# Patient Record
Sex: Male | Born: 1965 | Race: Black or African American | Hispanic: No | Smoking: Never smoker
Health system: Southern US, Community
[De-identification: ages and names within clinical notes are randomized; demographics above are authoritative.]

## PROBLEM LIST (undated history)

## (undated) DIAGNOSIS — L089 Local infection of the skin and subcutaneous tissue, unspecified: Secondary | ICD-10-CM

## (undated) DIAGNOSIS — E785 Hyperlipidemia, unspecified: Secondary | ICD-10-CM

## (undated) DIAGNOSIS — D649 Anemia, unspecified: Secondary | ICD-10-CM

## (undated) DIAGNOSIS — I2510 Atherosclerotic heart disease of native coronary artery without angina pectoris: Secondary | ICD-10-CM

## (undated) DIAGNOSIS — Z87442 Personal history of urinary calculi: Secondary | ICD-10-CM

## (undated) DIAGNOSIS — I219 Acute myocardial infarction, unspecified: Secondary | ICD-10-CM

## (undated) DIAGNOSIS — E11628 Type 2 diabetes mellitus with other skin complications: Secondary | ICD-10-CM

## (undated) DIAGNOSIS — R0600 Dyspnea, unspecified: Secondary | ICD-10-CM

## (undated) DIAGNOSIS — I5022 Chronic systolic (congestive) heart failure: Secondary | ICD-10-CM

## (undated) DIAGNOSIS — I509 Heart failure, unspecified: Secondary | ICD-10-CM

## (undated) DIAGNOSIS — I10 Essential (primary) hypertension: Secondary | ICD-10-CM

## (undated) DIAGNOSIS — E119 Type 2 diabetes mellitus without complications: Secondary | ICD-10-CM

## (undated) DIAGNOSIS — Z95810 Presence of automatic (implantable) cardiac defibrillator: Secondary | ICD-10-CM

## (undated) DIAGNOSIS — N289 Disorder of kidney and ureter, unspecified: Secondary | ICD-10-CM

## (undated) DIAGNOSIS — I428 Other cardiomyopathies: Secondary | ICD-10-CM

## (undated) HISTORY — PX: EYE SURGERY: SHX253

## (undated) HISTORY — PX: INSERT / REPLACE / REMOVE PACEMAKER: SUR710

## (undated) HISTORY — PX: BACK SURGERY: SHX140

## (undated) HISTORY — DX: Atherosclerotic heart disease of native coronary artery without angina pectoris: I25.10

## (undated) HISTORY — DX: Chronic systolic (congestive) heart failure: I50.22

## (undated) HISTORY — DX: Hyperlipidemia, unspecified: E78.5

## (undated) HISTORY — DX: Other cardiomyopathies: I42.8

## (undated) HISTORY — PX: CARDIAC CATHETERIZATION: SHX172

## (undated) HISTORY — PX: AMPUTATION TOE: SHX6595

---

## 2001-04-24 ENCOUNTER — Encounter: Payer: Self-pay | Admitting: Emergency Medicine

## 2001-04-24 ENCOUNTER — Emergency Department (HOSPITAL_COMMUNITY): Admission: EM | Admit: 2001-04-24 | Discharge: 2001-04-25 | Payer: Self-pay | Admitting: Emergency Medicine

## 2001-04-25 ENCOUNTER — Encounter: Payer: Self-pay | Admitting: Emergency Medicine

## 2001-04-27 ENCOUNTER — Ambulatory Visit (HOSPITAL_COMMUNITY): Admission: RE | Admit: 2001-04-27 | Discharge: 2001-04-27 | Payer: Self-pay | Admitting: Urology

## 2004-07-25 ENCOUNTER — Emergency Department (HOSPITAL_COMMUNITY): Admission: EM | Admit: 2004-07-25 | Discharge: 2004-07-26 | Payer: Self-pay | Admitting: Emergency Medicine

## 2004-09-09 ENCOUNTER — Ambulatory Visit: Payer: Self-pay | Admitting: Family Medicine

## 2004-12-10 ENCOUNTER — Ambulatory Visit: Payer: Self-pay | Admitting: *Deleted

## 2005-02-10 ENCOUNTER — Ambulatory Visit: Payer: Self-pay | Admitting: Internal Medicine

## 2005-02-24 ENCOUNTER — Ambulatory Visit: Payer: Self-pay | Admitting: Family Medicine

## 2005-08-29 ENCOUNTER — Emergency Department (HOSPITAL_COMMUNITY): Admission: EM | Admit: 2005-08-29 | Discharge: 2005-08-29 | Payer: Self-pay | Admitting: Emergency Medicine

## 2005-09-02 ENCOUNTER — Emergency Department (HOSPITAL_COMMUNITY): Admission: EM | Admit: 2005-09-02 | Discharge: 2005-09-02 | Payer: Self-pay | Admitting: Emergency Medicine

## 2005-12-29 ENCOUNTER — Ambulatory Visit (HOSPITAL_COMMUNITY): Admission: RE | Admit: 2005-12-29 | Discharge: 2005-12-29 | Payer: Self-pay | Admitting: Internal Medicine

## 2005-12-29 ENCOUNTER — Ambulatory Visit: Payer: Self-pay | Admitting: Internal Medicine

## 2006-01-12 ENCOUNTER — Ambulatory Visit: Payer: Self-pay | Admitting: Internal Medicine

## 2006-02-14 ENCOUNTER — Encounter: Admission: RE | Admit: 2006-02-14 | Discharge: 2006-05-15 | Payer: Self-pay | Admitting: Internal Medicine

## 2006-02-16 ENCOUNTER — Ambulatory Visit: Payer: Self-pay | Admitting: Internal Medicine

## 2006-02-21 ENCOUNTER — Emergency Department (HOSPITAL_COMMUNITY): Admission: EM | Admit: 2006-02-21 | Discharge: 2006-02-21 | Payer: Self-pay | Admitting: Emergency Medicine

## 2006-03-30 ENCOUNTER — Ambulatory Visit: Payer: Self-pay | Admitting: Endocrinology

## 2006-09-12 ENCOUNTER — Ambulatory Visit: Payer: Self-pay | Admitting: Internal Medicine

## 2006-12-28 ENCOUNTER — Emergency Department (HOSPITAL_COMMUNITY): Admission: EM | Admit: 2006-12-28 | Discharge: 2006-12-28 | Payer: Self-pay | Admitting: Emergency Medicine

## 2007-09-14 ENCOUNTER — Emergency Department (HOSPITAL_COMMUNITY): Admission: EM | Admit: 2007-09-14 | Discharge: 2007-09-14 | Payer: Self-pay | Admitting: Emergency Medicine

## 2007-09-15 ENCOUNTER — Encounter: Payer: Self-pay | Admitting: *Deleted

## 2008-01-23 ENCOUNTER — Emergency Department (HOSPITAL_COMMUNITY): Admission: EM | Admit: 2008-01-23 | Discharge: 2008-01-23 | Payer: Self-pay | Admitting: Emergency Medicine

## 2009-08-25 ENCOUNTER — Observation Stay (HOSPITAL_COMMUNITY): Admission: EM | Admit: 2009-08-25 | Discharge: 2009-08-25 | Payer: Self-pay | Admitting: Emergency Medicine

## 2011-03-07 ENCOUNTER — Encounter (INDEPENDENT_AMBULATORY_CARE_PROVIDER_SITE_OTHER): Payer: Self-pay | Admitting: Family Medicine

## 2011-03-07 LAB — CONVERTED CEMR LAB: Microalb, Ur: 21.25 mg/dL — ABNORMAL HIGH (ref 0.00–1.89)

## 2011-03-18 LAB — POCT I-STAT, CHEM 8
Calcium, Ion: 1.2 mmol/L (ref 1.12–1.32)
Hemoglobin: 15.6 g/dL (ref 13.0–17.0)
Sodium: 138 mEq/L (ref 135–145)
TCO2: 26 mmol/L (ref 0–100)

## 2011-03-18 LAB — GLUCOSE, CAPILLARY

## 2011-04-29 NOTE — Op Note (Signed)
[facility]  Patient:    SAJAD, PENTZ                    MRN: [id number] Proc. Date: 04/27/01 Adm. Date:  [id number] Attending:  Wendy Poet Iii                           Operative Report  PREOPERATIVE DIAGNOSIS:  Left ureterolithiasis with hydronephrosis.  OPERATIVE PROCEDURE:  Cystourethroscopy, left ureteroscopy with holmium laser lithotripsy and placement of left double-J stent.  SURGEON:  Duane Lope. Parks Neptune, M.D.  ANESTHESIA:  General laryngeal airway.  ESTIMATED BLOOD LOSS:  Negligible.  TUBES:  26 cm 6 French double pigtail stent.  COMPLICATIONS:  None.  INDICATIONS FOR PROCEDURE:  Mr. Alford Highland is a very nice 45 year old black male, who presented to the emergency room with nausea and vomiting and left flank pain.  He underwent CT scan which revealed a 6 mm lower ureteral calculus with proximal hydronephrosis.  He has persistently had some intermittent pain.  After understanding risks, benefits, and alternatives, elected to proceed with the above procedure.  DESCRIPTION OF PROCEDURE:  The patient was placed in a supine position.  After proper general laryngeal airway anesthesia, was placed in the dorsal lithotomy position and prepped and draped with Betadine in a sterile fashion. Cystourethroscopy was performed with a 22.5 [place] Olympus panendoscope.  He had no significant prostatic hypertrophy, and the bladder was smooth-walled. Efflux of urine was noted from the right ureteral orifice, and the left ureteral orifice was in normal location.  Initial attempts to place a guidewire were unsuccessful; therefore, a .038 [place] Glidewire was passed into the left renal pelvis, and the lower ureter was balloon dilated to 12 atmospheres of pressure with a 4 cm, 5 mm ureteral balloon dilator for three minutes and was noted to have no waisting.  This was removed, and ureteroscopy was then performed with a short, thin ACMI ureteroscope.   The stone was visualized and noted to be quite large.  Utilizing the holmium laser with 365 micron fiber, the stone was fragmented into multiple small fragments, and the fragments were grasped and retrieved with a nitinol basket.  There were a few scattered fragments remaining but no other significant fragments were noted, and ureteroscopy of the lower two-thirds ureter was performed, and there were no other lesions, stones, perforations, etc. noted.  The ureteroscope was visually removed, and a 26 cm, 6 [place] Surgitek double pigtail stent was placed over the guidewire and located in the left renal pelvis and in the bladder.  A pullout string was attached.  The bladder was drained.  The panendoscope was removed.  Stones will be submitted for stone analysis.  The patient was taken to the recovery room in stable condition. DD:  04/27/01 TD:  04/28/01 Job: [id number]

## 2011-05-19 ENCOUNTER — Emergency Department (HOSPITAL_COMMUNITY)
Admission: EM | Admit: 2011-05-19 | Discharge: 2011-05-19 | Disposition: A | Discharge status: Home / Self Care | Payer: Self-pay | Attending: Emergency Medicine | Admitting: Emergency Medicine

## 2011-05-19 ENCOUNTER — Emergency Department (HOSPITAL_COMMUNITY): Payer: Self-pay

## 2011-05-19 DIAGNOSIS — R5381 Other malaise: Secondary | ICD-10-CM | POA: Insufficient documentation

## 2011-05-19 DIAGNOSIS — R059 Cough, unspecified: Secondary | ICD-10-CM | POA: Insufficient documentation

## 2011-05-19 DIAGNOSIS — R42 Dizziness and giddiness: Secondary | ICD-10-CM | POA: Insufficient documentation

## 2011-05-19 DIAGNOSIS — E119 Type 2 diabetes mellitus without complications: Secondary | ICD-10-CM | POA: Insufficient documentation

## 2011-05-19 DIAGNOSIS — R05 Cough: Secondary | ICD-10-CM | POA: Insufficient documentation

## 2011-05-19 LAB — BASIC METABOLIC PANEL WITH GFR
CO2: 24 meq/L (ref 19–32)
Calcium: 9 mg/dL (ref 8.4–10.5)
Creatinine, Ser: 0.67 mg/dL (ref 0.4–1.5)
GFR calc non Af Amer: >60 mL/min (ref >60)
Glucose, Bld: 417 mg/dL — ABNORMAL HIGH (ref 70–99)
Sodium: 132 meq/L — ABNORMAL LOW (ref 135–145)

## 2011-05-19 LAB — BASIC METABOLIC PANEL
BUN: 8 mg/dL (ref 6–23)
Chloride: 98 mEq/L (ref 96–112)
GFR calc Af Amer: >60 mL/min (ref >60)
Potassium: 3.7 mEq/L (ref 3.5–5.1)

## 2011-05-19 LAB — CBC
HCT: 38.9 % — ABNORMAL LOW (ref 39.0–52.0)
Hemoglobin: 12.5 g/dL — ABNORMAL LOW (ref 13.0–17.0)
MCH: 22.6 pg — ABNORMAL LOW (ref 26.0–34.0)
MCHC: 32.1 g/dL (ref 30.0–36.0)
MCV: 70.5 fL — ABNORMAL LOW (ref 78.0–100.0)
Platelets: 218 10*3/uL (ref 150–400)
RBC: 5.52 MIL/uL (ref 4.22–5.81)
RDW: 17.6 % — ABNORMAL HIGH (ref 11.5–15.5)
WBC: 4.9 10*3/uL (ref 4.0–10.5)

## 2011-05-19 LAB — DIFFERENTIAL
Basophils Absolute: 0 K/uL (ref 0.0–0.1)
Basophils Relative: 0 % (ref 0–1)
Eosinophils Absolute: 0 10*3/uL (ref 0.0–0.7)
Eosinophils Relative: 1 % (ref 0–5)
Lymphocytes Relative: 38 % (ref 12–46)
Lymphs Abs: 1.8 10*3/uL (ref 0.7–4.0)
Monocytes Absolute: 0.4 K/uL (ref 0.1–1.0)
Monocytes Relative: 7 % (ref 3–12)
Neutro Abs: 2.6 10*3/uL (ref 1.7–7.7)
Neutrophils Relative %: 54 % (ref 43–77)

## 2011-05-19 LAB — GLUCOSE, CAPILLARY
Glucose-Capillary: 417 mg/dL — ABNORMAL HIGH (ref 70–99)
Glucose-Capillary: 238 mg/dL — ABNORMAL HIGH (ref 70–99)
Glucose-Capillary: 208 mg/dL — ABNORMAL HIGH (ref 70–99)

## 2011-09-22 LAB — WOUND CULTURE

## 2012-02-06 ENCOUNTER — Encounter (HOSPITAL_COMMUNITY): Payer: Self-pay | Admitting: *Deleted

## 2012-02-06 ENCOUNTER — Emergency Department (HOSPITAL_COMMUNITY): Payer: Self-pay

## 2012-02-06 ENCOUNTER — Emergency Department (HOSPITAL_COMMUNITY)
Admission: EM | Admit: 2012-02-06 | Discharge: 2012-02-06 | Disposition: A | Discharge status: Home / Self Care | Payer: Self-pay | Attending: Emergency Medicine | Admitting: Emergency Medicine

## 2012-02-06 DIAGNOSIS — J3489 Other specified disorders of nose and nasal sinuses: Secondary | ICD-10-CM | POA: Insufficient documentation

## 2012-02-06 DIAGNOSIS — H9209 Otalgia, unspecified ear: Secondary | ICD-10-CM | POA: Insufficient documentation

## 2012-02-06 DIAGNOSIS — J40 Bronchitis, not specified as acute or chronic: Secondary | ICD-10-CM | POA: Insufficient documentation

## 2012-02-06 DIAGNOSIS — E119 Type 2 diabetes mellitus without complications: Secondary | ICD-10-CM | POA: Insufficient documentation

## 2012-02-06 DIAGNOSIS — J069 Acute upper respiratory infection, unspecified: Secondary | ICD-10-CM | POA: Insufficient documentation

## 2012-02-06 DIAGNOSIS — Z79899 Other long term (current) drug therapy: Secondary | ICD-10-CM | POA: Insufficient documentation

## 2012-02-06 DIAGNOSIS — R05 Cough: Secondary | ICD-10-CM | POA: Insufficient documentation

## 2012-02-06 DIAGNOSIS — R059 Cough, unspecified: Secondary | ICD-10-CM | POA: Insufficient documentation

## 2012-02-06 LAB — GLUCOSE, CAPILLARY
Glucose-Capillary: 316 mg/dL — ABNORMAL HIGH (ref 70–99)
Glucose-Capillary: 238 mg/dL — ABNORMAL HIGH (ref 70–99)

## 2012-02-06 MED ORDER — INSULIN ASPART 100 UNIT/ML ~~LOC~~ SOLN
10.0000 [IU] | Freq: Once | SUBCUTANEOUS | Status: AC
  Administered 2012-02-06: 10 [IU] via INTRAVENOUS
  Filled 2012-02-06: qty 1

## 2012-02-06 MED ORDER — HYDROCOD POLST-CHLORPHEN POLST 10-8 MG/5ML PO LQCR: 5.0000 mL | Freq: Two times a day (BID) | ORAL | Status: DC | PRN

## 2012-02-06 NOTE — ED Provider Notes (Signed)
History     CSN: [id number]  Arrival date & time 02/06/12  1124   First MD Initiated Contact with Patient 02/06/12 1151      Chief Complaint  Patient presents with  . Cough  . URI    (Consider location/radiation/quality/duration/timing/severity/associated sxs/prior treatment) Patient is a 46 y.o. male presenting with cough and URI. The history is provided by the patient.  Cough This is a new problem. The current episode started 2 days ago. The problem occurs constantly. The problem has been gradually worsening. The cough is productive of sputum (Yellow sputum). There has been no fever. Associated symptoms include ear congestion and rhinorrhea. Pertinent negatives include no headaches, no shortness of breath and no wheezing. He has tried decongestants for the symptoms. The treatment provided no relief. He is not a smoker. His past medical history does not include bronchitis, COPD or asthma.  URI The primary symptoms include cough. Primary symptoms do not include headaches or wheezing. The current episode started 2 days ago. This is a new problem.  Symptoms associated with the illness include plugged ear sensation, congestion and rhinorrhea. The illness is not associated with facial pain or sinus pressure.    Past Medical History  Diagnosis Date  . Diabetes mellitus     History reviewed. No pertinent past surgical history.  No family history on file.  History  Substance Use Topics  . Smoking status: Never Smoker   . Smokeless tobacco: Not on file  . Alcohol Use: No      Review of Systems  HENT: Positive for congestion and rhinorrhea. Negative for sinus pressure.   Respiratory: Positive for cough. Negative for shortness of breath and wheezing.   Neurological: Negative for headaches.  All other systems reviewed and are negative.    Allergies  Review of patient's allergies indicates no known allergies.  Home Medications   Current Outpatient Rx  Name Route Sig  Dispense Refill  . GLYBURIDE 5 MG PO TABS Oral Take 5 mg by mouth 2 (two) times daily with a meal. Skip a dose if cbg is less than 100    . METFORMIN HCL 1000 MG PO TABS Oral Take 1,000 mg by mouth 2 (two) times daily with a meal.      BP 108/46  Pulse 98  Temp(Src) 98.8 F (37.1 C) (Oral)  Resp 14  Ht 5\' 8"  (1.727 m)  Wt 200 lb (90.719 kg)  BMI 30.41 kg/m2  SpO2 98%  Physical Exam  Nursing note and vitals reviewed. Constitutional: He is oriented to person, place, and time. He appears well-developed and well-nourished. No distress.  HENT:  Head: Normocephalic and atraumatic.  Right Ear: Tympanic membrane and ear canal normal.  Left Ear: Tympanic membrane and ear canal normal.  Nose: Mucosal edema and rhinorrhea present.  Mouth/Throat: Oropharynx is clear and moist.  Eyes: Conjunctivae and EOM are normal. Pupils are equal, round, and reactive to light.  Neck: Normal range of motion. Neck supple.  Cardiovascular: Normal rate, regular rhythm and intact distal pulses.   No murmur heard. Pulmonary/Chest: Effort normal and breath sounds normal. No respiratory distress. He has no wheezes. He has no rales.  Abdominal: Soft. He exhibits no distension. There is no tenderness. There is no rebound and no guarding.  Musculoskeletal: Normal range of motion. He exhibits no edema and no tenderness.  Neurological: He is alert and oriented to person, place, and time.  Skin: Skin is warm and dry. No rash noted. No erythema.  Psychiatric: He  has a normal mood and affect. His behavior is normal.    ED Course  Procedures (including critical care time)  Labs Reviewed - No data to display Dg Chest 2 View  02/06/2012  *RADIOLOGY REPORT*  Clinical Data: Cough and cold symptoms.  CHEST - 2 VIEW  Comparison: Chest x-ray 05/19/2011.  Findings: The heart is upper limits of normal in size and stable. Mild tortuosity of the thoracic aorta.  Mild bronchitic changes but no infiltrates or effusions.  The bony  thorax is intact.  IMPRESSION: Mild bronchitic type changes may suggest bronchitis.  No focal infiltrates or effusions.  Original Report Authenticated By: P. Kalman Jewels, M.D.     No diagnosis found.    MDM   Pt with symptoms consistent with viral URI.  Well appearing here.  No signs of breathing difficulty  No signs of pharyngitis, otitis or abnormal abdominal findings.   CXR wnl and pt to return with any further problems. \ Secondly patient's hyperglycemic and states he is still taking his diabetic meds. His blood sugar was 316 however he states he's very uncomfortable his blood sugar being 316 despite no vomiting or abdominal pain. He states that he has not had much of an appetite and has not eaten today. Will give him subcutaneous insulin and recheck.  Repeat blood sugar improved. Discharge patient home.      Blanchie Dessert, MD 02/06/12 1505

## 2012-02-06 NOTE — Discharge Instructions (Signed)

## 2012-02-06 NOTE — ED Notes (Signed)
Patient reports he has had cough with yellow production for 3 days.  Patient states he has pain in his chest on the left side that is related to his cough.  Patient also reports nasal congestion

## 2012-02-06 NOTE — ED Notes (Signed)
Patient transported to X-ray 

## 2012-12-22 ENCOUNTER — Emergency Department (HOSPITAL_COMMUNITY)
Admission: EM | Admit: 2012-12-22 | Discharge: 2012-12-22 | Disposition: A | Discharge status: Home / Self Care | Payer: Self-pay | Attending: Emergency Medicine | Admitting: Emergency Medicine

## 2012-12-22 ENCOUNTER — Encounter (HOSPITAL_COMMUNITY): Payer: Self-pay | Admitting: Nurse Practitioner

## 2012-12-22 DIAGNOSIS — E119 Type 2 diabetes mellitus without complications: Secondary | ICD-10-CM | POA: Insufficient documentation

## 2012-12-22 DIAGNOSIS — Z79899 Other long term (current) drug therapy: Secondary | ICD-10-CM | POA: Insufficient documentation

## 2012-12-22 DIAGNOSIS — L0291 Cutaneous abscess, unspecified: Secondary | ICD-10-CM

## 2012-12-22 DIAGNOSIS — L0390 Cellulitis, unspecified: Secondary | ICD-10-CM

## 2012-12-22 DIAGNOSIS — Y939 Activity, unspecified: Secondary | ICD-10-CM | POA: Insufficient documentation

## 2012-12-22 DIAGNOSIS — Y929 Unspecified place or not applicable: Secondary | ICD-10-CM | POA: Insufficient documentation

## 2012-12-22 DIAGNOSIS — IMO0002 Reserved for concepts with insufficient information to code with codable children: Secondary | ICD-10-CM | POA: Insufficient documentation

## 2012-12-22 LAB — CBC WITH DIFFERENTIAL/PLATELET
Basophils Absolute: 0 10*3/uL (ref 0.0–0.1)
Basophils Relative: 0 % (ref 0–1)
Eosinophils Absolute: 0 10*3/uL (ref 0.0–0.7)
Eosinophils Relative: 0 % (ref 0–5)
HCT: 42.3 % (ref 39.0–52.0)
Hemoglobin: 14 g/dL (ref 13.0–17.0)
Lymphocytes Relative: 21 % (ref 12–46)
Lymphs Abs: 1.7 10*3/uL (ref 0.7–4.0)
MCH: 25 pg — ABNORMAL LOW (ref 26.0–34.0)
MCHC: 33.1 g/dL (ref 30.0–36.0)
MCV: 75.5 fL — ABNORMAL LOW (ref 78.0–100.0)
Monocytes Absolute: 0.6 10*3/uL (ref 0.1–1.0)
Monocytes Relative: 8 % (ref 3–12)
Neutro Abs: 5.7 10*3/uL (ref 1.7–7.7)
Neutrophils Relative %: 70 % (ref 43–77)
Platelets: 223 10*3/uL (ref 150–400)
RBC: 5.6 MIL/uL (ref 4.22–5.81)
RDW: 14.1 % (ref 11.5–15.5)
WBC: 8.1 10*3/uL (ref 4.0–10.5)

## 2012-12-22 LAB — COMPREHENSIVE METABOLIC PANEL WITH GFR
ALT: 12 U/L (ref 0–53)
AST: 9 U/L (ref 0–37)
Albumin: 3.5 g/dL (ref 3.5–5.2)
Alkaline Phosphatase: 107 U/L (ref 39–117)
BUN: 6 mg/dL (ref 6–23)
CO2: 23 meq/L (ref 19–32)
Calcium: 9.3 mg/dL (ref 8.4–10.5)
Chloride: 96 meq/L (ref 96–112)
Creatinine, Ser: 0.51 mg/dL (ref 0.50–1.35)
GFR calc Af Amer: >90 mL/min (ref >90)
GFR calc non Af Amer: >90 mL/min (ref >90)
Glucose, Bld: 396 mg/dL — ABNORMAL HIGH (ref 70–99)
Potassium: 3.8 meq/L (ref 3.5–5.1)
Sodium: 131 meq/L — ABNORMAL LOW (ref 135–145)
Total Bilirubin: 0.6 mg/dL (ref 0.3–1.2)
Total Protein: 7.9 g/dL (ref 6.0–8.3)

## 2012-12-22 LAB — GLUCOSE, CAPILLARY
Glucose-Capillary: 459 mg/dL — ABNORMAL HIGH (ref 70–99)
Glucose-Capillary: 305 mg/dL — ABNORMAL HIGH (ref 70–99)

## 2012-12-22 MED ORDER — INSULIN ASPART 100 UNIT/ML ~~LOC~~ SOLN
5.0000 [IU] | Freq: Once | SUBCUTANEOUS | Status: AC
  Administered 2012-12-22: 5 [IU] via SUBCUTANEOUS
  Filled 2012-12-22: qty 1

## 2012-12-22 MED ORDER — GLYBURIDE 5 MG PO TABS: 5.0000 mg | ORAL_TABLET | Freq: Two times a day (BID) | ORAL | Status: DC

## 2012-12-22 MED ORDER — CLINDAMYCIN HCL 150 MG PO CAPS: 300.0000 mg | ORAL_CAPSULE | Freq: Four times a day (QID) | ORAL | Status: DC

## 2012-12-22 MED ORDER — CLINDAMYCIN PHOSPHATE 900 MG/50ML IV SOLN
900.0000 mg | Freq: Once | INTRAVENOUS | Status: AC
  Administered 2012-12-22: 900 mg via INTRAVENOUS
  Filled 2012-12-22: qty 50

## 2012-12-22 NOTE — ED Notes (Signed)
Awaiting blood cultures prior to antibiotic administration

## 2012-12-22 NOTE — ED Provider Notes (Signed)
Medical screening examination/treatment/procedure(s) were performed by non-physician practitioner and as supervising physician I was immediately available for consultation/collaboration.  Pt discussed with me  Rolland Porter, MD, Abram Sander   Janice Norrie, MD 12/22/12 [phone]

## 2012-12-22 NOTE — ED Provider Notes (Signed)
History     CSN: [id number]  Arrival date & time 12/22/12  67   First MD Initiated Contact with Patient 12/22/12 1125      Chief Complaint  Patient presents with  . Insect Bite   HPI Jeffrey [name] is a 47 y.o. male who presents to the ED with skin infection. The infection started 2 days ago. The infection is located on the right arm near the elbow. There are 2 seprate lesions. The history was provided by the patient.   Past Medical History  Diagnosis Date  . Diabetes mellitus     History reviewed. No pertinent past surgical history.  History reviewed. No pertinent family history.  History  Substance Use Topics  . Smoking status: Never Smoker   . Smokeless tobacco: Not on file  . Alcohol Use: No      Review of Systems  Constitutional: Negative for fever and activity change.  HENT: Negative for sore throat, facial swelling and neck pain.   Respiratory: Negative for cough.   Cardiovascular: Negative for chest pain and palpitations.  Gastrointestinal: Negative for nausea, vomiting and abdominal pain.  Musculoskeletal: Negative for gait problem.  Skin: Positive for wound.       redness  Neurological: Negative for dizziness and light-headedness.  Psychiatric/Behavioral: Negative for confusion. The patient is not nervous/anxious.     Allergies  Review of patient's allergies indicates no known allergies.  Home Medications   Current Outpatient Rx  Name  Route  Sig  Dispense  Refill  . GLYBURIDE 5 MG PO TABS   Oral   Take 5 mg by mouth 2 (two) times daily with a meal. Skip a dose if cbg is less than 100         . METFORMIN HCL 1000 MG PO TABS   Oral   Take 1,000 mg by mouth 2 (two) times daily with a meal.           BP 143/96  Pulse 108  Temp 97.9 F (36.6 C) (Oral)  Resp 16  SpO2 98%  Physical Exam  Nursing note and vitals reviewed. Constitutional: He is oriented to person, place, and time. He appears well-developed and well-nourished. No  distress.  HENT:  Head: Normocephalic and atraumatic.  Eyes: EOM are normal. Pupils are equal, round, and reactive to light.  Neck: Neck supple.  Cardiovascular: Normal rate and regular rhythm.   Pulmonary/Chest: Effort normal. No respiratory distress.  Abdominal: Soft. There is no tenderness.  Musculoskeletal:       Arms:      Abscess x 2 with cellulitis  Neurological: He is alert and oriented to person, place, and time. No cranial nerve deficit.  Skin: Lesion and rash noted. Rash is pustular.  Psychiatric: He has a normal mood and affect. His behavior is normal. Judgment and thought content normal.   Procedures Consent form signed. Time out.   Patient positioned and draped with sterile towels.  Preoperative medication Lidocaine: infiltrate with lidocaine 2%. Amount 1 ccs  I&D Scalpel size: #11blade Incision type: Straight single Complexity: simple Drained small amount of purulent drainage Probed with curved hemostat to break up loculations Irrigated with NSS  Packing: none Patient tolerance: Tolerated procedure well.  Dressing applied  Results for orders placed during the hospital encounter of 12/22/12 (from the past 24 hour(s))  GLUCOSE, CAPILLARY     Status: Abnormal   Collection Time   12/22/12 10:43 AM      Component Value Range   Glucose-Capillary  459 (*) 70 - 99 mg/dL  COMPREHENSIVE METABOLIC PANEL     Status: Abnormal   Collection Time   12/22/12 12:07 PM      Component Value Range   Sodium 131 (*) 135 - 145 mEq/L   Potassium 3.8  3.5 - 5.1 mEq/L   Chloride 96  96 - 112 mEq/L   CO2 23  19 - 32 mEq/L   Glucose, Bld 396 (*) 70 - 99 mg/dL   BUN 6  6 - 23 mg/dL   Creatinine, Ser 0.51  0.50 - 1.35 mg/dL   Calcium 9.3  8.4 - 10.5 mg/dL   Total Protein 7.9  6.0 - 8.3 g/dL   Albumin 3.5  3.5 - 5.2 g/dL   AST 9  0 - 37 U/L   ALT 12  0 - 53 U/L   Alkaline Phosphatase 107  39 - 117 U/L   Total Bilirubin 0.6  0.3 - 1.2 mg/dL   GFR calc non Af Amer >90  >90  mL/min   GFR calc Af Amer >90  >90 mL/min  CBC WITH DIFFERENTIAL     Status: Abnormal   Collection Time   12/22/12 12:07 PM      Component Value Range   WBC 8.1  4.0 - 10.5 K/uL   RBC 5.60  4.22 - 5.81 MIL/uL   Hemoglobin 14.0  13.0 - 17.0 g/dL   HCT 42.3  39.0 - 52.0 %   MCV 75.5 (*) 78.0 - 100.0 fL   MCH 25.0 (*) 26.0 - 34.0 pg   MCHC 33.1  30.0 - 36.0 g/dL   RDW 14.1  11.5 - 15.5 %   Platelets 223  150 - 400 K/uL   Neutrophils Relative 70  43 - 77 %   Neutro Abs 5.7  1.7 - 7.7 K/uL   Lymphocytes Relative 21  12 - 46 %   Lymphs Abs 1.7  0.7 - 4.0 K/uL   Monocytes Relative 8  3 - 12 %   Monocytes Absolute 0.6  0.1 - 1.0 K/uL   Eosinophils Relative 0  0 - 5 %   Eosinophils Absolute 0.0  0.0 - 0.7 K/uL   Basophils Relative 0  0 - 1 %   Basophils Absolute 0.0  0.0 - 0.1 K/uL  GLUCOSE, CAPILLARY     Status: Abnormal   Collection Time   12/22/12  3:33 PM      Component Value Range   Glucose-Capillary 305 (*) 70 - 99 mg/dL    Patient states he is out of his glyburide and needs Rx  Assessment: 48 y.o. male with skin infection   Abscess with cellulitis right arm  Plan:  IV Clindamycin 900 mg   Insulin regular 5 U SQ   Rx clindamycin   Rx Glyburide   Return tomorrow for recheck I have reviewed this patient's vital signs, nurses notes, appropriate labs and discussed findings with the patient and plan of care. Patient voices understanding.      Medication List     As of 12/22/2012  3:55 PM    START taking these medications         clindamycin 150 MG capsule   Commonly known as: CLEOCIN   Take 2 capsules (300 mg total) by mouth every 6 (six) hours.      * glyBURIDE 5 MG tablet   Commonly known as: DIABETA   Take 1 tablet (5 mg total) by mouth 2 (two) times daily with a meal.     *  Notice: This list has 1 medication(s) that are the same as other medications prescribed for you. Read the directions carefully, and ask your doctor or other care provider to review them with you.     ASK your doctor about these medications         * glyBURIDE 5 MG tablet   Commonly known as: DIABETA      metFORMIN 1000 MG tablet   Commonly known as: GLUCOPHAGE     * Notice: This list has 1 medication(s) that are the same as other medications prescribed for you. Read the directions carefully, and ask your doctor or other care provider to review them with you.        Where to get your medications    These are the prescriptions that you need to pick up.   You may get these medications from any pharmacy.         clindamycin 150 MG capsule   glyBURIDE 5 MG tablet             Ashley Murrain, NP 12/22/12 1555

## 2012-12-22 NOTE — ED Notes (Signed)
NP at bedside to completed I and D.

## 2012-12-22 NOTE — ED Notes (Signed)
Pt reports 2 wounds to R forearm x 2 days. Increasing in pain, redness and swelling since onset. Pt also requesting cbg check

## 2012-12-23 ENCOUNTER — Encounter (HOSPITAL_COMMUNITY): Payer: Self-pay | Admitting: Emergency Medicine

## 2012-12-23 ENCOUNTER — Emergency Department (HOSPITAL_COMMUNITY)
Admission: EM | Admit: 2012-12-23 | Discharge: 2012-12-23 | Disposition: A | Discharge status: Home / Self Care | Payer: Self-pay | Attending: Emergency Medicine | Admitting: Emergency Medicine

## 2012-12-23 DIAGNOSIS — E119 Type 2 diabetes mellitus without complications: Secondary | ICD-10-CM | POA: Insufficient documentation

## 2012-12-23 DIAGNOSIS — IMO0002 Reserved for concepts with insufficient information to code with codable children: Secondary | ICD-10-CM | POA: Insufficient documentation

## 2012-12-23 DIAGNOSIS — Z79899 Other long term (current) drug therapy: Secondary | ICD-10-CM | POA: Insufficient documentation

## 2012-12-23 MED ORDER — SULFAMETHOXAZOLE-TRIMETHOPRIM 800-160 MG PO TABS: 2.0000 | ORAL_TABLET | Freq: Two times a day (BID) | ORAL | Status: DC

## 2012-12-23 MED ORDER — SULFAMETHOXAZOLE-TMP DS 800-160 MG PO TABS
2.0000 | ORAL_TABLET | Freq: Once | ORAL | Status: AC
  Administered 2012-12-23: 2 via ORAL
  Filled 2012-12-23: qty 2

## 2012-12-23 NOTE — ED Notes (Signed)
Pt. Stated, I'm here cause I'm suppose to have my rt. Upper arm re-checked. From a bite that I think I got on Thursday

## 2012-12-23 NOTE — ED Provider Notes (Signed)
History   This chart was scribed for Jeffrey Blitz, PA, by Jeffrey [name], ED Scribe. This patient was seen in room TR05C/TR05C and the patient's care was started at 1556.   CSN: [id number]  Arrival date & time 12/23/12  1355   First MD Initiated Contact with Patient 12/23/12 1556      Chief Complaint  Patient presents with  . Wound Check     The history is provided by the patient. No language interpreter was used.    Jeffrey [name] is a 46 y.o. male non-insulin-dependent diabetic who presents to the Emergency Department requesting wound check on right upper arm from an abscess that was incised and drained 3 days ago. He denies fever, nausea, vomiting, IV drug use.  If he thinks he may have been bitten by an insect  Pt has h/o DM.  Pt denies smoking and alcohol use.   Past Medical History  Diagnosis Date  . Diabetes mellitus     History reviewed. No pertinent past surgical history.  No family history on file.  History  Substance Use Topics  . Smoking status: Never Smoker   . Smokeless tobacco: Not on file  . Alcohol Use: No      Review of Systems  Constitutional: Negative for fever.  Respiratory: Negative for shortness of breath.   Cardiovascular: Negative for chest pain.  Gastrointestinal: Negative for nausea, vomiting, abdominal pain and diarrhea.  Skin: Positive for wound.  All other systems reviewed and are negative.    Allergies  Review of patient's allergies indicates no known allergies.  Home Medications   Current Outpatient Rx  Name  Route  Sig  Dispense  Refill  . CLINDAMYCIN HCL 150 MG PO CAPS   Oral   Take 2 capsules (300 mg total) by mouth every 6 (six) hours.   28 capsule   0   . GLYBURIDE 5 MG PO TABS   Oral   Take 5 mg by mouth 2 (two) times daily with a meal. Skip a dose if cbg is less than 100         . GLYBURIDE 5 MG PO TABS   Oral   Take 1 tablet (5 mg total) by mouth 2 (two) times daily with a meal.   60 tablet   0   .  METFORMIN HCL 1000 MG PO TABS   Oral   Take 1,000 mg by mouth 2 (two) times daily with a meal.           BP 125/82  Pulse 100  Temp 99.4 F (37.4 C) (Oral)  Resp 18  SpO2 97%  Physical Exam  Nursing note and vitals reviewed. Constitutional: He is oriented to person, place, and time. He appears well-developed and well-nourished. No distress.  HENT:  Head: Normocephalic.  Eyes: Conjunctivae normal and EOM are normal.  Cardiovascular: Normal rate.   Pulmonary/Chest: Effort normal. No stridor.  Musculoskeletal: Normal range of motion.  Neurological: He is alert and oriented to person, place, and time.  Skin:       3cm draining abscess to the left anterior cubital space with 5x10 cm of induration.   Also has 3cm actively draining abscess to the lower triceps.   Psychiatric: He has a normal mood and affect.    ED Course  Procedures (including critical care time)  DIAGNOSTIC STUDIES: Oxygen Saturation is 97% on room air, adequate by my interpretation.    COORDINATION OF CARE:  4:16 PM Discussed treatment plan which includes  oral antibiotics and follow up in ED in 48 hours with pt at bedside and pt agreed to plan.    Labs Reviewed - No data to display No results found.   1. Cellulitis and abscess of upper arm and forearm       MDM  Patient states the area of induration has not increased. He states he has not yet had time to get his antibiotic prescription refilled. He has assured me that he will go immediately from the emergency room to the pharmacy to obtain his antibiotics. He has also assured me that money will not be an issue and that he can afford the antibiotics. I see if the patient was prescribed clindamycin. He is uninsured and I think that this will be a barrier to his obtaining the antibiotics. I discussed this with him and I will switch him over to Bactrim I have advised him to present to her Jeffrey [name] where he can obtain a prescription for free.  He will have a  wound check in 48 hours and he repeated her precautions to me.  I personally performed the services described in this documentation, which was scribed in my presence. The recorded information has been reviewed and is accurate.   Pt verbalized understanding and agrees with care plan. Outpatient follow-up and return precautions given.    New Prescriptions   SULFAMETHOXAZOLE-TRIMETHOPRIM (BACTRIM DS) 800-160 MG PER TABLET    Take 2 tablets by mouth 2 (two) times daily.    Jeffrey Blitz, PA-C 12/23/12 1628

## 2012-12-24 NOTE — ED Provider Notes (Signed)
Medical screening examination/treatment/procedure(s) were performed by non-physician practitioner and as supervising physician I was immediately available for consultation/collaboration.   Mirna Mires, MD 12/24/12 1116

## 2012-12-28 LAB — CULTURE, BLOOD (ROUTINE X 2)

## 2013-03-11 ENCOUNTER — Emergency Department (HOSPITAL_COMMUNITY)
Admission: EM | Admit: 2013-03-11 | Discharge: 2013-03-11 | Disposition: A | Discharge status: Home / Self Care | Payer: Self-pay | Attending: Emergency Medicine | Admitting: Emergency Medicine

## 2013-03-11 ENCOUNTER — Emergency Department (HOSPITAL_COMMUNITY): Payer: Self-pay

## 2013-03-11 ENCOUNTER — Encounter (HOSPITAL_COMMUNITY): Payer: Self-pay | Admitting: Emergency Medicine

## 2013-03-11 DIAGNOSIS — M79645 Pain in left finger(s): Secondary | ICD-10-CM

## 2013-03-11 DIAGNOSIS — Z79899 Other long term (current) drug therapy: Secondary | ICD-10-CM | POA: Insufficient documentation

## 2013-03-11 DIAGNOSIS — E119 Type 2 diabetes mellitus without complications: Secondary | ICD-10-CM | POA: Insufficient documentation

## 2013-03-11 DIAGNOSIS — M79609 Pain in unspecified limb: Secondary | ICD-10-CM | POA: Insufficient documentation

## 2013-03-11 DIAGNOSIS — M7989 Other specified soft tissue disorders: Secondary | ICD-10-CM | POA: Insufficient documentation

## 2013-03-11 MED ORDER — OXYCODONE-ACETAMINOPHEN 5-325 MG PO TABS: 1.0000 | ORAL_TABLET | ORAL | Status: DC | PRN

## 2013-03-11 MED ORDER — CEPHALEXIN 500 MG PO CAPS: 500.0000 mg | ORAL_CAPSULE | Freq: Four times a day (QID) | ORAL | Status: DC

## 2013-03-11 MED ORDER — CEPHALEXIN 250 MG PO CAPS
1000.0000 mg | ORAL_CAPSULE | Freq: Once | ORAL | Status: AC
  Administered 2013-03-11: 1000 mg via ORAL
  Filled 2013-03-11: qty 4

## 2013-03-11 NOTE — Progress Notes (Signed)
Orthopedic Tech Progress Note Patient Details:  Jeffrey [name] 18-Sep-1966 [id number]  Ortho Devices Type of Ortho Device: Finger splint Ortho Device/Splint Location: (L) UE Ortho Device/Splint Interventions: Ordered;Application   Braulio Bosch 03/11/2013, 8:49 PM

## 2013-03-11 NOTE — ED Provider Notes (Signed)
History  This chart was scribed for Delora Fuel, MD by Roe Coombs, ED Scribe. The patient was seen in room TR05C/TR05C. [name] care was started at [facility].   CSN: [id number]  Arrival date & time 03/11/13  [id number]   First MD Initiated Contact with Patient 03/11/13 1857      Chief Complaint  Patient presents with  . Finger Injury     The history is provided by the patient. No language interpreter was used.    HPI Comments: Jeffrey [name] is a 47 y.o. male with history of diabetes who presents to the Emergency Department complaining of moderate to severe, constant, non-radiating left 3rd finger pain with associated swelling onset 2 days ago. Patient denies any obvious injury or trauma to the finger. Her rates pain as 8/10. Pain is mildly increased with bending of the finger. Patient has applied ice to the finger with minimal relief. There is no fever, chills, nausea, vomiting, numbness or weakness. Patient states that he has had a skin infection before. He does not smoke or use alcohol. Patient does not have a PCP.    Past Medical History  Diagnosis Date  . Diabetes mellitus     History reviewed. No pertinent past surgical history.  History reviewed. No pertinent family history.  History  Substance Use Topics  . Smoking status: Never Smoker   . Smokeless tobacco: Not on file  . Alcohol Use: No     Review of Systems  Constitutional: Negative for fever and chills.  Gastrointestinal: Negative for nausea and vomiting.  Neurological: Negative for weakness and numbness.    Allergies  Review of patient's allergies indicates no known allergies.  Home Medications   Current Outpatient Rx  Name  Route  Sig  Dispense  Refill  . glyBURIDE (DIABETA) 5 MG tablet   Oral   Take 5 mg by mouth 2 (two) times daily with a meal. Skip a dose if cbg is less than 100         . metFORMIN (GLUCOPHAGE) 1000 MG tablet   Oral   Take 1,000 mg by mouth 2 (two) times daily with a meal.          . sulfamethoxazole-trimethoprim (BACTRIM DS) 800-160 MG per tablet   Oral   Take 2 tablets by mouth 2 (two) times daily.   28 tablet   0     Triage Vitals: BP 143/81  Pulse 110  Temp(Src) 98.7 F (37.1 C) (Oral)  Resp 18  Ht 5\' 7"  (1.702 m)  Wt 190 lb (86.183 kg)  BMI 29.75 kg/m2  SpO2 99%  Physical Exam  Constitutional: He is oriented to person, place, and time. He appears well-developed and well-nourished.  HENT:  Head: Normocephalic and atraumatic.  Eyes: Conjunctivae and EOM are normal. Pupils are equal, round, and reactive to light.  Neck: Normal range of motion. Neck supple.  Cardiovascular: Normal rate, regular rhythm and normal heart sounds.   Pulmonary/Chest: Effort normal and breath sounds normal.  Musculoskeletal:       Left hand: He exhibits tenderness and swelling.  Left 3rd finger has mild soft tissue swelling. No definite felon or paronychia. ROM is mildly restricted by pain. Mild tenderness diffusely in the distal phalanx. No erythema or warmth.  Neurological: He is alert and oriented to person, place, and time.  Skin: Skin is warm and dry. No erythema.    ED Course  Procedures (including critical care time) DIAGNOSTIC STUDIES: Oxygen Saturation is 99% on room air,  normal by my interpretation.    COORDINATION OF CARE: 6:59 PM- Patient informed of current plan for treatment and evaluation and agrees with plan at this time.   Dg Finger Middle Left  03/11/2013  *RADIOLOGY REPORT*  Clinical Data: 1-day history of pain and swelling to the left long finger.  No known injuries.  LEFT MIDDLE FINGER 2+V  Comparison: Left hand x-ray 08/29/2005.  Findings: Gas bubble in the soft tissues of the distal tuft, underlying the nail bed.  Diffuse soft tissue swelling.  No underlying osseous abnormality.  No acute or subacute fractures. No evidence of osteomyelitis.  Well-preserved joint spaces.  Well- preserved bone mineral density.  IMPRESSION: Soft tissue swelling.   Gas bubble underlying the nail bed, query infection.  No osseous abnormality.   Original Report Authenticated By: Evangeline Dakin, M.D.       1. Pain in finger of left hand       MDM  Finger swelling which seems to be infectious in nature. He may be in the process of developing either a paronychia or felon, but neither is well enough developed to warrant drainage. He'll be referred to hand surgery for followup and discharged with a prescription for Keflex for antibiotic and Percocet for pain.      I personally performed the services described in this documentation, which was scribed in my presence. The recorded information has been reviewed and is accurate.   Delora Fuel, MD [id number]

## 2013-03-11 NOTE — ED Notes (Signed)
Ortho tech called to place splint. 

## 2013-03-11 NOTE — ED Notes (Signed)
Pt c/o left middle finger pain and swelling x 2 days

## 2013-03-12 ENCOUNTER — Encounter (HOSPITAL_COMMUNITY): Payer: Self-pay | Admitting: *Deleted

## 2013-03-12 ENCOUNTER — Emergency Department (HOSPITAL_COMMUNITY)
Admission: EM | Admit: 2013-03-12 | Discharge: 2013-03-13 | Disposition: A | Discharge status: Home / Self Care | Payer: Self-pay | Attending: Emergency Medicine | Admitting: Emergency Medicine

## 2013-03-12 DIAGNOSIS — L03019 Cellulitis of unspecified finger: Secondary | ICD-10-CM | POA: Insufficient documentation

## 2013-03-12 DIAGNOSIS — IMO0002 Reserved for concepts with insufficient information to code with codable children: Secondary | ICD-10-CM

## 2013-03-12 DIAGNOSIS — E119 Type 2 diabetes mellitus without complications: Secondary | ICD-10-CM | POA: Insufficient documentation

## 2013-03-12 NOTE — ED Notes (Signed)
Pt states his left hand middle finger started swelling Sunday. Pt came yesterday and no I&D performed finger not swollen enough. Pt states finger has increased in swelling since visit yesterday.

## 2013-03-13 NOTE — ED Notes (Signed)
Pt discharged.Vital signs stable and GCS 15 

## 2013-03-13 NOTE — ED Provider Notes (Signed)
History     CSN: [id number]  Arrival date & time 03/12/13  2258   None     Chief Complaint  Patient presents with  . Finger Injury    infection    (Consider location/radiation/quality/duration/timing/severity/associated sxs/prior treatment) HPI History provided by pt.   Pt c/o severe pain in L middle finger x 4 days.  Associated w/ edema.  Denies fever and paresthesias.  Denies trauma.  Per prior chart, seen for same yesterday, diagnosed w/ paronychia but did appear large enough to warrant drainage at that time, prescribed keflex and referred to Hand.  Pt reports compliance w/ abx.  He is a diabetic.   Past Medical History  Diagnosis Date  . Diabetes mellitus     History reviewed. No pertinent past surgical history.  History reviewed. No pertinent family history.  History  Substance Use Topics  . Smoking status: Never Smoker   . Smokeless tobacco: Not on file  . Alcohol Use: No      Review of Systems  All other systems reviewed and are negative.    Allergies  Review of patient's allergies indicates no known allergies.  Home Medications   Current Outpatient Rx  Name  Route  Sig  Dispense  Refill  . cephALEXin (KEFLEX) 500 MG capsule   Oral   Take 1 capsule (500 mg total) by mouth 4 (four) times daily.   40 capsule   0   . oxyCODONE-acetaminophen (PERCOCET/ROXICET) 5-325 MG per tablet   Oral   Take 1 tablet by mouth every 4 (four) hours as needed for pain.   12 tablet   0     BP 131/81  Pulse 91  Temp(Src) 98.5 F (36.9 C) (Oral)  Resp 16  SpO2 100%  Physical Exam  Nursing note and vitals reviewed. Constitutional: He is oriented to person, place, and time. He appears well-developed and well-nourished. No distress.  HENT:  Head: Normocephalic and atraumatic.  Eyes:  Normal appearance  Neck: Normal range of motion.  Pulmonary/Chest: Effort normal.  Musculoskeletal: Normal range of motion.  Diffuse edema of distal phalanx of left middle finger.   Erythema and tenderness proximal nailfold.  Mild tenderness of pad of finger.  No pain w/ passive ROM of DIP joint and distal sensation intact.   Neurological: He is alert and oriented to person, place, and time.  Psychiatric: He has a normal mood and affect. His behavior is normal.    ED Course  Procedures (including critical care time)  Labs Reviewed - No data to display Dg Finger Middle Left  03/11/2013  *RADIOLOGY REPORT*  Clinical Data: 1-day history of pain and swelling to the left long finger.  No known injuries.  LEFT MIDDLE FINGER 2+V  Comparison: Left hand x-ray 08/29/2005.  Findings: Gas bubble in the soft tissues of the distal tuft, underlying the nail bed.  Diffuse soft tissue swelling.  No underlying osseous abnormality.  No acute or subacute fractures. No evidence of osteomyelitis.  Well-preserved joint spaces.  Well- preserved bone mineral density.  IMPRESSION: Soft tissue swelling.  Gas bubble underlying the nail bed, query infection.  No osseous abnormality.   Original Report Authenticated By: Evangeline Dakin, M.D.      1. Paronychia, unspecified laterality       MDM  47yo M diabetic presents for second day in a row w/ L middle finger edema and pain.  Diagnosed w/ paronychia yesterday and prescribed keflex.  Compliant but sx worsening.  Cuticle lifted w/ 18 gauge  needle and a large amt of pus was drained today.  Pt had immediate relief.  Recommended warm soaks bid and that he continue prescribed abx and pain medication.  Return precautions discussed.         Remer Macho, PA-C 03/13/13 [phone]

## 2013-03-13 NOTE — ED Provider Notes (Signed)
Medical screening examination/treatment/procedure(s) were performed by non-physician practitioner and as supervising physician I was immediately available for consultation/collaboration.   Maudry Diego, MD 03/13/13 [phone]

## 2013-07-12 ENCOUNTER — Emergency Department (HOSPITAL_COMMUNITY)
Admission: EM | Admit: 2013-07-12 | Discharge: 2013-07-13 | Disposition: A | Discharge status: Home / Self Care | Payer: Self-pay | Attending: Emergency Medicine | Admitting: Emergency Medicine

## 2013-07-12 ENCOUNTER — Encounter (HOSPITAL_COMMUNITY): Payer: Self-pay | Admitting: Emergency Medicine

## 2013-07-12 DIAGNOSIS — L02211 Cutaneous abscess of abdominal wall: Secondary | ICD-10-CM

## 2013-07-12 DIAGNOSIS — R21 Rash and other nonspecific skin eruption: Secondary | ICD-10-CM | POA: Insufficient documentation

## 2013-07-12 DIAGNOSIS — L02219 Cutaneous abscess of trunk, unspecified: Secondary | ICD-10-CM | POA: Insufficient documentation

## 2013-07-12 DIAGNOSIS — R739 Hyperglycemia, unspecified: Secondary | ICD-10-CM

## 2013-07-12 DIAGNOSIS — E1169 Type 2 diabetes mellitus with other specified complication: Secondary | ICD-10-CM | POA: Insufficient documentation

## 2013-07-12 LAB — URINALYSIS, ROUTINE W REFLEX MICROSCOPIC
Bilirubin Urine: NEGATIVE
Ketones, ur: NEGATIVE mg/dL
Nitrite: NEGATIVE
Specific Gravity, Urine: 1.037 — ABNORMAL HIGH (ref 1.005–1.030)
Urobilinogen, UA: 1 mg/dL (ref 0.0–1.0)

## 2013-07-12 LAB — CBC
HCT: 40.5 % (ref 39.0–52.0)
Hemoglobin: 13.8 g/dL (ref 13.0–17.0)
MCH: 25.6 pg — ABNORMAL LOW (ref 26.0–34.0)
MCV: 75 fL — ABNORMAL LOW (ref 78.0–100.0)
RBC: 5.4 MIL/uL (ref 4.22–5.81)

## 2013-07-12 LAB — GLUCOSE, CAPILLARY

## 2013-07-12 LAB — COMPREHENSIVE METABOLIC PANEL
ALT: 9 U/L (ref 0–53)
Alkaline Phosphatase: 99 U/L (ref 39–117)
CO2: 22 mEq/L (ref 19–32)
Calcium: 9.2 mg/dL (ref 8.4–10.5)
GFR calc Af Amer: >90 mL/min (ref >90)
GFR calc non Af Amer: >90 mL/min (ref >90)
Glucose, Bld: 479 mg/dL — ABNORMAL HIGH (ref 70–99)
Sodium: 130 mEq/L — ABNORMAL LOW (ref 135–145)

## 2013-07-12 MED ORDER — INSULIN ASPART 100 UNIT/ML ~~LOC~~ SOLN
10.0000 [IU] | Freq: Once | SUBCUTANEOUS | Status: AC
  Administered 2013-07-12: 10 [IU] via INTRAVENOUS
  Filled 2013-07-12: qty 1

## 2013-07-12 MED ORDER — SODIUM CHLORIDE 0.9 % IV SOLN
1000.0000 mL | Freq: Once | INTRAVENOUS | Status: AC
  Administered 2013-07-12: 1000 mL via INTRAVENOUS

## 2013-07-12 MED ORDER — SODIUM CHLORIDE 0.9 % IV SOLN
1000.0000 mL | INTRAVENOUS | Status: DC
  Administered 2013-07-13: 1000 mL via INTRAVENOUS

## 2013-07-12 NOTE — ED Notes (Signed)
UA collected and sent to lab.

## 2013-07-12 NOTE — ED Notes (Signed)
Secondary complaint. Multiple skin lesions appearing on abdomen, left flank, left thigh. Present > 1 week. Per spouse at bedside. PT is reluctant to discuss

## 2013-07-12 NOTE — ED Provider Notes (Signed)
CSN: [id number]     Arrival date & time 07/12/13  2207 History     First MD Initiated Contact with Patient 07/12/13 2256     Chief Complaint  Patient presents with  . Hyperglycemia  . Rash   (Consider location/radiation/quality/duration/timing/severity/associated sxs/prior Treatment) Patient is a 47 y.o. male presenting with hyperglycemia and rash. The history is provided by the patient.  Hyperglycemia Rash He is diabetic and ran out of his metformin one month ago. His blood sugars have been running high the last week has the highest they have been has been.450. He is complaining of feeling weak and dizzy. He denies headache, chest pain, heaviness, tightness, pressure. He denies dyspnea, nausea, vomiting, diarrhea. He denies arthralgias or myalgias. He has not done anything to try and help his symptoms. He has also had various lesions come up in his abdomen and legs. They will come to ahead and drain and then remain is a hyperpigmented area. He was told that it may be a fungal infection and was given an antifungal medication but it has not helped.  Past Medical History  Diagnosis Date  . Diabetes mellitus    History reviewed. No pertinent past surgical history. History reviewed. No pertinent family history. History  Substance Use Topics  . Smoking status: Never Smoker   . Smokeless tobacco: Not on file  . Alcohol Use: No    Review of Systems  Skin: Positive for rash.  All other systems reviewed and are negative.    Allergies  Review of patient's allergies indicates no known allergies.  Home Medications  No current outpatient prescriptions on file. BP 123/73  Pulse 91  Temp(Src) 98.2 F (36.8 C) (Oral)  Resp 18  SpO2 99% Physical Exam  Nursing note and vitals reviewed.  47 year old male, resting comfortably and in no acute distress. Vital signs are normal. Oxygen saturation is 99%, which is normal. Head is normocephalic and atraumatic. PERRLA, EOMI. Oropharynx is  clear. Neck is nontender and supple without adenopathy or JVD. Back is nontender and there is no CVA tenderness. Lungs are clear without rales, wheezes, or rhonchi. Chest is nontender. Heart has regular rate and rhythm without murmur. Abdomen is soft, flat, nontender without masses or hepatosplenomegaly and peristalsis is normoactive. Pustule is present in the epigastric area with surrounding area of induration with hyperpigmented skin. The diameter of the hyperpigmented, indurated area is about 1.5 cm. There is another area in the left lower quadrant which is hyperpigmented but soft and flat. Extremities have no cyanosis or edema, full range of motion is present. There 2 hyperpigmented areas similar to the one on the abdomen. These are also soft and flat. Skin is warm and dry without other rash. Neurologic: Mental status is normal, cranial nerves are intact, there are no motor or sensory deficits.  ED Course   Procedures (including critical care time) INCISION AND DRAINAGE Performed by: [id number] Consent: Verbal consent obtained. Risks and benefits: risks, benefits and alternatives were discussed Type: abscess  Body area: Abdomen  Anesthesia: local infiltration  Incision was made with an 18-gauge needle.  Local anesthetic: lidocaine 2% without epinephrine  Anesthetic total: 1 ml  Complexity: Simple   Drainage: purulent  Drainage amount: Scant   Packing material: None   Patient tolerance: Patient tolerated the procedure well with no immediate complications.   Results for orders placed during the hospital encounter of 07/12/13  GLUCOSE, CAPILLARY      Result Value Range   Glucose-Capillary 456 (*) 70 -  99 mg/dL   Comment 1 Documented in Chart     Comment 2 Notify RN    CBC      Result Value Range   WBC 5.3  4.0 - 10.5 K/uL   RBC 5.40  4.22 - 5.81 MIL/uL   Hemoglobin 13.8  13.0 - 17.0 g/dL   HCT 40.5  39.0 - 52.0 %   MCV 75.0 (*) 78.0 - 100.0 fL   MCH 25.6 (*)  26.0 - 34.0 pg   MCHC 34.1  30.0 - 36.0 g/dL   RDW 14.4  11.5 - 15.5 %   Platelets 224  150 - 400 K/uL  COMPREHENSIVE METABOLIC PANEL      Result Value Range   Sodium 130 (*) 135 - 145 mEq/L   Potassium 3.5  3.5 - 5.1 mEq/L   Chloride 98  96 - 112 mEq/L   CO2 22  19 - 32 mEq/L   Glucose, Bld 479 (*) 70 - 99 mg/dL   BUN 11  6 - 23 mg/dL   Creatinine, Ser 0.54  0.50 - 1.35 mg/dL   Calcium 9.2  8.4 - 10.5 mg/dL   Total Protein 7.6  6.0 - 8.3 g/dL   Albumin 3.4 (*) 3.5 - 5.2 g/dL   AST 10  0 - 37 U/L   ALT 9  0 - 53 U/L   Alkaline Phosphatase 99  39 - 117 U/L   Total Bilirubin 0.2 (*) 0.3 - 1.2 mg/dL   GFR calc non Af Amer >90  >90 mL/min   GFR calc Af Amer >90  >90 mL/min  URINALYSIS, ROUTINE W REFLEX MICROSCOPIC      Result Value Range   Color, Urine YELLOW  YELLOW   APPearance CLEAR  CLEAR   Specific Gravity, Urine 1.037 (*) 1.005 - 1.030   pH 6.5  5.0 - 8.0   Glucose, UA >1000 (*) NEGATIVE mg/dL   Hgb urine dipstick NEGATIVE  NEGATIVE   Bilirubin Urine NEGATIVE  NEGATIVE   Ketones, ur NEGATIVE  NEGATIVE mg/dL   Protein, ur NEGATIVE  NEGATIVE mg/dL   Urobilinogen, UA 1.0  0.0 - 1.0 mg/dL   Nitrite NEGATIVE  NEGATIVE   Leukocytes, UA NEGATIVE  NEGATIVE  URINE MICROSCOPIC-ADD ON      Result Value Range   WBC, UA 0-2  <3 WBC/hpf   RBC / HPF 0-2  <3 RBC/hpf   Urine-Other RARE YEAST    GLUCOSE, CAPILLARY      Result Value Range   Glucose-Capillary 164 (*) 70 - 99 mg/dL   1. Hyperglycemia   2. Abscess of abdominal wall     MDM  Hypoglycemia secondary to medication noncompliance. Pustule of the abdomen with several other areas which appear to be a healing abscess sees him suspicious for MRSA infection. He'll be treated with IV fluids and IV insulin.  Blood sugars come down to 164. Because he has had several pustules/abscess sees oh, you will be given a course of doxycycline to treat probable MRSA infection. He is given a prescription for metformin and is referred to [facility] for followup.  Delora Fuel, MD [id number] [phone]

## 2013-07-12 NOTE — ED Notes (Signed)
Pt states he is unable to void at present time

## 2013-07-12 NOTE — ED Notes (Signed)
Presents with double vision and dizziness. Diabetic. States last CBG in 400's. NO CP, SOB. Ambulatory at triage

## 2013-07-13 MED ORDER — DOXYCYCLINE HYCLATE 100 MG PO CAPS: 100.0000 mg | ORAL_CAPSULE | Freq: Two times a day (BID) | ORAL | Status: DC

## 2013-07-13 MED ORDER — METFORMIN HCL 500 MG PO TABS: 500.0000 mg | ORAL_TABLET | Freq: Two times a day (BID) | ORAL | Status: DC

## 2013-07-13 NOTE — ED Notes (Signed)
Checked CBG 164 

## 2013-07-13 NOTE — ED Notes (Signed)
2nd liter of NS started mother remains at bedside

## 2013-07-14 NOTE — ED Notes (Signed)
Pt came back to the ED today stating he could not afford the Doxycycline he was prescribed for his infection. RN contact PA-c Margarita Mail. PA-C changed script to Bactrim DS BID x10 days.

## 2013-08-05 ENCOUNTER — Ambulatory Visit: Payer: Self-pay

## 2013-08-14 ENCOUNTER — Ambulatory Visit: Payer: Self-pay

## 2013-08-21 ENCOUNTER — Encounter: Payer: Self-pay | Admitting: Internal Medicine

## 2013-08-21 ENCOUNTER — Ambulatory Visit: Payer: Self-pay | Attending: Internal Medicine | Admitting: Internal Medicine

## 2013-08-21 VITALS — BP 144/90 | HR 87 | Temp 98.2°F | Resp 16 | Ht 68.0 in | Wt 191.0 lb

## 2013-08-21 DIAGNOSIS — E1142 Type 2 diabetes mellitus with diabetic polyneuropathy: Secondary | ICD-10-CM | POA: Insufficient documentation

## 2013-08-21 DIAGNOSIS — B351 Tinea unguium: Secondary | ICD-10-CM

## 2013-08-21 DIAGNOSIS — H538 Other visual disturbances: Secondary | ICD-10-CM

## 2013-08-21 DIAGNOSIS — I10 Essential (primary) hypertension: Secondary | ICD-10-CM

## 2013-08-21 DIAGNOSIS — E1140 Type 2 diabetes mellitus with diabetic neuropathy, unspecified: Secondary | ICD-10-CM

## 2013-08-21 DIAGNOSIS — E1149 Type 2 diabetes mellitus with other diabetic neurological complication: Secondary | ICD-10-CM | POA: Insufficient documentation

## 2013-08-21 LAB — CBC WITH DIFFERENTIAL/PLATELET
Basophils Absolute: 0 10*3/uL (ref 0.0–0.1)
Basophils Relative: 1 % (ref 0–1)
Eosinophils Relative: 1 % (ref 0–5)
HCT: 44.1 % (ref 39.0–52.0)
Lymphocytes Relative: 42 % (ref 12–46)
MCHC: 31.5 g/dL (ref 30.0–36.0)
MCV: 76.3 fL — ABNORMAL LOW (ref 78.0–100.0)
Monocytes Absolute: 0.2 10*3/uL (ref 0.1–1.0)
Platelets: 268 10*3/uL (ref 150–400)
RDW: 15.8 % — ABNORMAL HIGH (ref 11.5–15.5)
WBC: 3.1 10*3/uL — ABNORMAL LOW (ref 4.0–10.5)

## 2013-08-21 LAB — BASIC METABOLIC PANEL
CO2: 25 mEq/L (ref 19–32)
Glucose, Bld: 448 mg/dL — ABNORMAL HIGH (ref 70–99)
Potassium: 4 mEq/L (ref 3.5–5.3)
Sodium: 133 mEq/L — ABNORMAL LOW (ref 135–145)

## 2013-08-21 MED ORDER — AMLODIPINE BESYLATE 10 MG PO TABS: 10.0000 mg | ORAL_TABLET | Freq: Every day | ORAL | Status: DC

## 2013-08-21 MED ORDER — KETOCONAZOLE 2 % EX CREA: TOPICAL_CREAM | Freq: Every day | CUTANEOUS | Status: DC

## 2013-08-21 MED ORDER — GABAPENTIN 100 MG PO CAPS: 100.0000 mg | ORAL_CAPSULE | Freq: Three times a day (TID) | ORAL | Status: DC

## 2013-08-21 MED ORDER — METFORMIN HCL 1000 MG PO TABS: 1000.0000 mg | ORAL_TABLET | Freq: Two times a day (BID) | ORAL | Status: DC

## 2013-08-21 NOTE — Progress Notes (Signed)
HFU Pt is here to establish care. 3 weeks ago admitted to the hospital with high blood sugar and MRSA. Pt reports of having ED for over a year. Pt has pain and numbness in his feet. Also has fungus on both big toes. He says that his vision is getting blurred and sometimes hard to see.

## 2013-08-21 NOTE — Progress Notes (Signed)
Patient ID: Jeffrey [name], male   DOB: [birth year], 47 y.o.   MRN: [id number]  CC: New patient  HPI: 47 year old male with past medical history of uncontrolled diabetes who presented to clinic to establish primary care provider. Patient reports having blurry vision and decreased vision for quite some time now. He had recent hospitalization for uncontrolled diabetes. He also complains of numbness and tingling sensation in "stocking/glove distribution"again for last couple of months. No complaints of polyuria or polydipsia. No chest pain or palpitations. No shortness of breath. No fevers or chills  No Known Allergies Past Medical History  Diagnosis Date  . Diabetes mellitus    No current outpatient prescriptions on file prior to visit.   No current facility-administered medications on file prior to visit.   Family medical history significant for HTN, HLD  History   Social History  . Marital Status: Single    Spouse Name: N/A    Number of Children: N/A  . Years of Education: N/A   Occupational History  . Not on file.   Social History Main Topics  . Smoking status: Never Smoker   . Smokeless tobacco: Not on file  . Alcohol Use: No  . Drug Use: No  . Sexual Activity:    Other Topics Concern  . Not on file   Social History Narrative  . No narrative on file    Review of Systems  Constitutional: Negative for fever, chills, diaphoresis, activity change, appetite change and fatigue.  HENT: Negative for ear pain, nosebleeds, congestion, facial swelling, rhinorrhea, neck pain, neck stiffness and ear discharge.   Eyes: Negative for pain, discharge, redness, itching; positive for blurry vision  Respiratory: Negative for cough, choking, chest tightness, shortness of breath, wheezing and stridor.   Cardiovascular: Negative for chest pain, palpitations and leg swelling.  Gastrointestinal: Negative for abdominal distention.  Genitourinary: Negative for dysuria, urgency, frequency,  hematuria, flank pain, decreased urine volume, difficulty urinating and dyspareunia.  Musculoskeletal: Negative for back pain, joint swelling, arthralgias and gait problem.  Neurological: Positive for tingling and numbness sensation in her feet and palms.  Hematological: Negative for adenopathy. Does not bruise/bleed easily.  Psychiatric/Behavioral: Negative for hallucinations, behavioral problems, confusion, dysphoric mood, decreased concentration and agitation.    Objective:   Filed Vitals:   08/21/13 1150  BP: 144/90  Pulse: 87  Temp: 98.2 F (36.8 C)  Resp: 16    Physical Exam  Constitutional: Appears well-developed and well-nourished. No distress.  HENT: Normocephalic. External right and left ear normal. Oropharynx is clear and moist.  Eyes: Conjunctivae and EOM are normal. PERRLA, no scleral icterus.  Neck: Normal ROM. Neck supple. No JVD. No tracheal deviation. No thyromegaly.  CVS: RRR, S1/S2 +, no murmurs, no gallops, no carotid bruit.  Pulmonary: Effort and breath sounds normal, no stridor, rhonchi, wheezes, rales.  Abdominal: Soft. BS +,  no distension, tenderness, rebound or guarding.  Musculoskeletal: Normal range of motion. No edema and no tenderness.  Lymphadenopathy: No lymphadenopathy noted, cervical, inguinal. Neuro: Alert. Normal reflexes, muscle tone coordination. No cranial nerve deficit. Skin: Skin is warm and dry. No rash noted. Not diaphoretic. No erythema. No pallor.  Psychiatric: Normal mood and affect. Behavior, judgment, thought content normal.   Lab Results  Component Value Date   WBC 5.3 07/12/2013   HGB 13.8 07/12/2013   HCT 40.5 07/12/2013   MCV 75.0* 07/12/2013   PLT 224 07/12/2013   Lab Results  Component Value Date   CREATININE 0.54 07/12/2013  BUN 11 07/12/2013   NA 130* 07/12/2013   K 3.5 07/12/2013   CL 98 07/12/2013   CO2 22 07/12/2013    No results found for this basename: HGBA1C   Lipid Panel  No results found for this basename: chol, trig,  hdl, cholhdl, vldl, ldlcalc       Assessment and plan:   Patient Active Problem List   Diagnosis Date Noted  . HTN (hypertension) 08/21/2013    Priority: High - - We have discussed target BP range - I have advised pt to check BP regularly and to call us back if the numbers are higher than 140/90 - discussed the importance of compliance with medical therapy and diet  - Systolic blood pressure this morning 144. We will prescribe Norvasc 10 mg daily   . Diabetic neuropathy 08/21/2013    Priority: High - Prescription provided for gabapentin 100 mg 3 times daily   . Blurry vision 08/21/2013    Priority: High - Possible cataract  - Referral to optometrist provided   . Fungal toenail infection 08/21/2013    Priority: Medium - Ketoconazole cream provided for fungal toe infection  - Referral to podiatry provided   . DIABETES MELLITUS, TYPE II - Check A1c today  - Increase metformin to 1000 mg twice daily  09/15/2007

## 2013-08-21 NOTE — Patient Instructions (Signed)
Blood Sugar Monitoring, Adult GLUCOSE METERS FOR SELF-MONITORING OF BLOOD GLUCOSE  It is important to be able to correctly measure your blood sugar (glucose). You can use a blood glucose monitor (a small battery-operated device) to check your glucose level at any time. This allows you and your caregiver to monitor your diabetes and to determine how well your treatment plan is working. The process of monitoring your blood glucose with a glucose meter is called self-monitoring of blood glucose (SMBG). When people with diabetes control their blood sugar, they have better health. To test for glucose with a typical glucose meter, place the disposable strip in the meter. Then place a small sample of blood on the "test strip." The test strip is coated with chemicals that combine with glucose in blood. The meter measures how much glucose is present. The meter displays the glucose level as a number. Several new models can record and store a number of test results. Some models can connect to personal computers to store test results or print them out.  Newer meters are often easier to use than older models. Some meters allow you to get blood from places other than your fingertip. Some new models have automatic timing, error codes, signals, or barcode readers to help with proper adjustment (calibration). Some meters have a large display screen or spoken instructions for people with visual impairments.  INSTRUCTIONS FOR USING GLUCOSE METERS  Wash your hands with soap and warm water, or clean the area with alcohol. Dry your hands completely.  Prick the side of your fingertip with a lancet (a sharp-pointed tool used by hand).  Hold the hand down and gently milk the finger until a small drop of blood appears. Catch the blood with the test strip.  Follow the instructions for inserting the test strip and using the SMBG meter. Most meters require the meter to be turned on and the test strip to be inserted before applying  the blood sample.  Record the test result.  Read the instructions carefully for both the meter and the test strips that go with it. Meter instructions are found in the user manual. Keep this manual to help you solve any problems that may arise. Many meters use "error codes" when there is a problem with the meter, the test strip, or the blood sample on the strip. You will need the manual to understand these error codes and fix the problem.  New devices are available such as laser lancets and meters that can test blood taken from "alternative sites" of the body, other than fingertips. However, you should use standard fingertip testing if your glucose changes rapidly. Also, use standard testing if:  You have eaten, exercised, or taken insulin in the past 2 hours.  You think your glucose is low.  You tend to not feel symptoms of low blood glucose (hypoglycemia).  You are ill or under stress.  Clean the meter as directed by the manufacturer.  Test the meter for accuracy as directed by the manufacturer.  Take your meter with you to your caregiver's office. This way, you can test your glucose in front of your caregiver to make sure you are using the meter correctly. Your caregiver can also take a sample of blood to test using a routine lab method. If values on the glucose meter are close to the lab results, you and your caregiver will see that your meter is working well and you are using good technique. Your caregiver will advise you about what  to do if the results do not match. FREQUENCY OF TESTING  Your caregiver will tell you how often you should check your blood glucose. This will depend on your type of diabetes, your current level of diabetes control, and your types of medicines. The following are general guidelines, but your care plan may be different. Record all your readings and the time of day you took them for review with your caregiver.   Diabetes type 1.  When you are using insulin  with good diabetic control (either multiple daily injections or via a pump), you should check your glucose 4 times a day.  If your diabetes is not well controlled, you may need to monitor more frequently, including before meals and 2 hours after meals, at bedtime, and occasionally between 2 a.m. and 3 a.m.  You should always check your glucose before a dose of insulin or before changing the rate on your insulin pump.  Diabetes type 2.  Guidelines for SMBG in diabetes type 2 are not as well defined.  If you are on insulin, follow the guidelines above.  If you are on medicines, but not insulin, and your glucose is not well controlled, you should test at least twice daily.  If you are not on insulin, and your diabetes is controlled with medicines or diet alone, you should test at least once daily, usually before breakfast.  A weekly profile will help your caregiver advise you on your care plan. The week before your visit, check your glucose before a meal and 2 hours after a meal at least daily. You may want to test before and after a different meal each day so you and your caregiver can tell how well controlled your blood sugars are throughout the course of a 24 hour period.  Gestational diabetes (diabetes during pregnancy).  Frequent testing is often necessary. Accurate timing is important.  If you are not on insulin, check your glucose 4 times a day. Check it before breakfast and 1 hour after the start of each meal.  If you are on insulin, check your glucose 6 times a day. Check it before each meal and 1 hour after the first bite of each meal.  General guidelines.  More frequent testing is required at the start of insulin treatment. Your caregiver will instruct you.  Test your glucose any time you suspect you have low blood sugar (hypoglycemia).  You should test more often when you change medicines, when you have unusual stress or illness, or in other unusual circumstances. OTHER  THINGS TO KNOW ABOUT GLUCOSE METERS  Measurement Range. Most glucose meters are able to read glucose levels over a broad range of values from as low as 0 to as high as 600 mg/dL. If you get an extremely high or low reading from your meter, you should first confirm it with another reading. Report very high or very low readings to your caregiver.  Whole Blood Glucose versus Plasma Glucose. Some older home glucose meters measure glucose in your whole blood. In a lab or when using some newer home glucose meters, the glucose is measured in your plasma (one component of blood). The difference can be important. It is important for you and your caregiver to know whether your meter gives its results as "whole blood equivalent" or "plasma equivalent."  Display of High and Low Glucose Values. Part of learning how to operate a meter is understanding what the meter results mean. Know how high and low glucose concentrations are displayed  on your meter.  Factors that Affect Glucose Meter Performance. The accuracy of your test results depends on many factors and varies depending on the brand and type of meter. These factors include:  Low red blood cell count (anemia).  Substances in your blood (such as uric acid, vitamin C, and others).  Environmental factors (temperature, humidity, altitude).  Name-brand versus generic test strips.  Calibration. Make sure your meter is set up properly. It is a good idea to do a calibration test with a control solution recommended by the manufacturer of your meter whenever you begin using a fresh bottle of test strips. This will help verify the accuracy of your meter.  Improperly stored, expired, or defective test strips. Keep your strips in a dry place with the lid on.  Soiled meter.  Inadequate blood sample. NEW TECHNOLOGIES FOR GLUCOSE TESTING Alternative site testing Some glucose meters allow testing blood from alternative sites. These include the:  Upper  arm.  Forearm.  Base of the thumb.  Thigh. Sampling blood from alternative sites may be desirable. However, it may have some limitations. Blood in the fingertips show changes in glucose levels more quickly than blood in other parts of the body. This means that alternative site test results may be different from fingertip test results, not because of the meter's ability to test accurately, but because the actual glucose concentration can be different.  Continuous Glucose Monitoring Devices to measure your blood glucose continuously are available, and others are in development. These methods can be more expensive than self-monitoring with a glucose meter. However, it is uncertain how effective and reliable these devices are. Your caregiver will advise you if this approach makes sense for you. IF BLOOD SUGARS ARE CONTROLLED, PEOPLE WITH DIABETES REMAIN HEALTHIER.  SMBG is an important part of the treatment plan of patients with diabetes mellitus. Below are reasons for using SMBG:   It confirms that your glucose is at a specific, healthy level.  It detects hypoglycemia and severe hyperglycemia.  It allows you and your caregiver to make adjustments in response to changes in lifestyle for individuals requiring medicine.  It determines the need for starting insulin therapy in temporary diabetes that happens during pregnancy (gestational diabetes). Document Released: 12/01/2003 Document Revised: 02/20/2012 Document Reviewed: 03/24/2011 [facility] Patient Information 2014 [place]. Diabetes, Frequently Asked Questions WHAT IS DIABETES? Most of the food we eat is turned into glucose (sugar). Our bodies use it for energy. The pancreas makes a hormone called insulin. It helps glucose get into the cells of our bodies. When you have diabetes, your body either does not make enough insulin or cannot use its own insulin as well as it should. This causes sugars to build up in your blood. WHAT ARE THE  SYMPTOMS OF DIABETES?  Frequent urination.  Excessive thirst.  Unexplained weight loss.  Extreme hunger.  Blurred vision.  Tingling or numbness in hands or feet.  Feeling very tired much of the time.  Dry, itchy skin.  Sores that are slow to heal.  Yeast infections. WHAT ARE THE TYPES OF DIABETES? Type 1 Diabetes   About 10% of affected people have this type.  Usually occurs before the age of 48.  Usually occurs in thin to normal weight people. Type 2 Diabetes  About 90% of affected people have this type.  Usually occurs after the age of 24.  Usually occurs in overweight people.  More likely to have:  A family history of diabetes.  A history of diabetes  during pregnancy (gestational diabetes).  High blood pressure.  High cholesterol and triglycerides. Gestational Diabetes  Occurs in about 4% of pregnancies.  Usually goes away after the baby is born.  More likely to occur in women with:  Family history of diabetes.  Previous gestational diabetes.  Obese.  Over 88 years old. WHAT IS PRE-DIABETES? Pre-diabetes means your blood glucose is higher than normal, but lower than the diabetes range. It also means you are at risk of getting type 2 diabetes and heart disease. If you are told you have pre-diabetes, have your blood glucose checked again in 1 to 2 years. WHAT IS THE TREATMENT FOR DIABETES? Treatment is aimed at keeping blood glucose near normal levels at all times. Learning how to manage this yourself is important in treating diabetes. Depending on the type of diabetes you have, your treatment will include one or more of the following:  Monitoring your blood glucose.  Meal planning.  Exercise.  Oral medicine (pills) or insulin. CAN DIABETES BE PREVENTED? With type 1 diabetes, prevention is more difficult, because the triggers that cause it are not yet known. With type 2 diabetes, prevention is more likely, with lifestyle changes:  Maintain  a healthy weight.  Eat healthy.  Exercise. IS THERE A CURE FOR DIABETES? No, there is no cure for diabetes. There is a lot of research going on that is looking for a cure, and progress is being made. Diabetes can be treated and controlled. People with diabetes can manage their diabetes and lead normal, active lives. SHOULD I BE TESTED FOR DIABETES? If you are at least 48 years old, you should be tested for diabetes. You should be tested again every 3 years. If you are 65 or older and overweight, you may want to get tested more often. If you are younger than 58, overweight, and have one or more of the following risk factors, you should be tested:  Family history of diabetes.  Inactive lifestyle.  High blood pressure. WHAT ARE SOME OTHER SOURCES FOR INFORMATION ON DIABETES? The following organizations may help in your search for more information on diabetes: National Diabetes Education Program (NDEP) Internet: BloggerBowl.es American Diabetes Association Internet: http://www.diabetes.org  Juvenile Diabetes Foundation International Internet: AffordableSalon.es Document Released: 12/01/2003 Document Revised: 02/20/2012 Document Reviewed: 09/25/2009 [facility] Patient Information 2014 [place]. Diabetic Nephropathy Diabetic nephropathy is a complication of diabetes that leads to damaged kidneys. It develops slowly. The function of healthy kidneys is to filter and clean blood. Kidneys also get rid of body waste products and extra fluid. When the kidney filters are damaged, there is protein loss in the urine, a decline in kidney function, a buildup of kidney waste products and fluid, and high blood pressure. The damage progresses until the kidneys fail.  RISK FACTORS  High blood pressure (hypertension).  High blood sugar (hyperglycemia).  Family history.  Aging.  Obstruction problems affecting the kidneys, the tubes that drain the kidneys (ureters), or the  bladder.  Taking certain drugs or medicines. SYMPTOMS  Symptoms may not be seen or felt for many years. You may not notice any signs of kidney failure until your kidneys have lost much of their ability to function. An early sign of damage is when small amounts of protein (albumin) leak into the urine. However, this can only be found through a urine test. Without physical symptoms, a urine test is often not performed. When the kidneys fail, you may feel one or more of the following:  Swelling of the hands  and feet from the extra fluid in your body.  Constant upset stomach.  Constant fatigue. DIAGNOSIS When someone has diabetes, screening tests are done to look for any early signs of problems before symptoms develop and before damage has already been done. These tests may include:   Annual urine tests to screen for trace amounts of protein in the urine (microalbuminuria).  Urine collectionover 24 hours to measure kidney function.  Blood tests that measure kidney function. Your caregiver is aware that problems other than diabetes can damage kidneys. If screening tests show early kidney damage, but it is thought that a different problem is causing the damage, other tests may be performed. Examples of these tests include:  An ultrasound of your kidney.  Taking a tissue sample (biopsy) from the kidney. TREATMENT The goal of treatment is to prevent or slow down damage to your kidneys. Controlling hypertension and hyperglycemia is critical. Your goal is to maintain a blood pressure below 130/80. If you have certain other medical problems, this goal may be different. Talk to your caregiver to make sure that your blood pressure goal is right for your needs. Regular testing of your blood glucose at home is important. Your goal is to have a normal blood glucose (110 or less when fasting) as often as possible.  In addition, maintaining your hemoglobin A1c level at less than 7% reduces your risk for  complications, including kidney damage. Common treatments include:  Dieting by controlling what you eat as well as the portion sizes.  Exercising to control blood pressure and blood glucose.  Taking medicines.  Giving yourself insulin injections if your caregiver feels that it is necessary.  Getting early treatment for urinary tract infections.  Regularly following up with your caregiver. If your disease progresses to end-stage kidney failure, you will need dialysis or a transplant. Dialysis can be done in 1 of 2 ways:  Hemodialysis. Your blood flows from a tube in your arm through a machine. The machine filters waste and extra fluid. The clean blood flows back into your arm.  Peritoneal dialysis. Your abdomen is filled with a special fluid. The fluid collects waste products and extra fluid from your blood. The fluid is then drained from your abdomen and discarded. SEEK MEDICAL CARE IF:   You are having problems keeping your blood glucose in the goal range.  You have swelling of the hands or feet.  You have weakness.  You have muscles spasms.  You have a constant upset stomach.  You feel tired all the time and this is not normal for you. SEEK IMMEDIATE MEDICAL CARE IF:  You have unusual dizziness or weakness.  You have excessive sleepiness.  You have a seizure or convulsion.  You have severe, painful muscle spasms.  You have shortness of breath or trouble breathing.  You pass out or have a fainting episode.  You have chest pains. MAKE SURE YOU:  Understand these instructions.  Will watch your condition.  Will get help right away if you are not doing well or get worse. Document Released: 12/18/2007 Document Revised: 02/20/2012 Document Reviewed: 07/20/2011 [facility] Patient Information 2014 [place], [place].

## 2013-09-20 ENCOUNTER — Ambulatory Visit: Payer: Self-pay

## 2014-03-10 ENCOUNTER — Emergency Department (HOSPITAL_COMMUNITY)
Admission: EM | Admit: 2014-03-10 | Discharge: 2014-03-11 | Disposition: A | Discharge status: Home / Self Care | Payer: Self-pay | Attending: Emergency Medicine | Admitting: Emergency Medicine

## 2014-03-10 ENCOUNTER — Encounter (HOSPITAL_COMMUNITY): Payer: Self-pay | Admitting: Emergency Medicine

## 2014-03-10 ENCOUNTER — Emergency Department (HOSPITAL_COMMUNITY): Payer: Self-pay

## 2014-03-10 DIAGNOSIS — N478 Other disorders of prepuce: Secondary | ICD-10-CM | POA: Insufficient documentation

## 2014-03-10 DIAGNOSIS — R079 Chest pain, unspecified: Secondary | ICD-10-CM | POA: Insufficient documentation

## 2014-03-10 DIAGNOSIS — Z79899 Other long term (current) drug therapy: Secondary | ICD-10-CM | POA: Insufficient documentation

## 2014-03-10 DIAGNOSIS — M79609 Pain in unspecified limb: Secondary | ICD-10-CM | POA: Insufficient documentation

## 2014-03-10 DIAGNOSIS — R109 Unspecified abdominal pain: Secondary | ICD-10-CM | POA: Insufficient documentation

## 2014-03-10 DIAGNOSIS — M545 Low back pain, unspecified: Secondary | ICD-10-CM | POA: Insufficient documentation

## 2014-03-10 DIAGNOSIS — N471 Phimosis: Secondary | ICD-10-CM | POA: Insufficient documentation

## 2014-03-10 DIAGNOSIS — E119 Type 2 diabetes mellitus without complications: Secondary | ICD-10-CM | POA: Insufficient documentation

## 2014-03-10 DIAGNOSIS — R739 Hyperglycemia, unspecified: Secondary | ICD-10-CM

## 2014-03-10 LAB — CBC
HCT: 41.9 % (ref 39.0–52.0)
Hemoglobin: 13.9 g/dL (ref 13.0–17.0)
MCH: 25.2 pg — ABNORMAL LOW (ref 26.0–34.0)
MCHC: 33.2 g/dL (ref 30.0–36.0)
MCV: 76 fL — AB (ref 78.0–100.0)
Platelets: 250 10*3/uL (ref 150–400)
RBC: 5.51 MIL/uL (ref 4.22–5.81)
RDW: 14.4 % (ref 11.5–15.5)
WBC: 5.1 10*3/uL (ref 4.0–10.5)

## 2014-03-10 LAB — BASIC METABOLIC PANEL
BUN: 9 mg/dL (ref 6–23)
CO2: 24 mEq/L (ref 19–32)
CREATININE: 0.67 mg/dL (ref 0.50–1.35)
Calcium: 8.7 mg/dL (ref 8.4–10.5)
Chloride: 98 mEq/L (ref 96–112)
GFR calc Af Amer: >90 mL/min (ref >90)
GLUCOSE: 483 mg/dL — AB (ref 70–99)
Potassium: 3.8 mEq/L (ref 3.7–5.3)
SODIUM: 136 meq/L — AB (ref 137–147)

## 2014-03-10 LAB — URINALYSIS, ROUTINE W REFLEX MICROSCOPIC
BILIRUBIN URINE: NEGATIVE
HGB URINE DIPSTICK: NEGATIVE
Ketones, ur: NEGATIVE mg/dL
Leukocytes, UA: NEGATIVE
NITRITE: NEGATIVE
PH: 6 (ref 5.0–8.0)
Protein, ur: NEGATIVE mg/dL
Specific Gravity, Urine: 1.01 (ref 1.005–1.030)
Urobilinogen, UA: 1 mg/dL (ref 0.0–1.0)

## 2014-03-10 LAB — URINE MICROSCOPIC-ADD ON

## 2014-03-10 LAB — I-STAT TROPONIN, ED: Troponin i, poc: 0 ng/mL (ref 0.00–0.08)

## 2014-03-10 MED ORDER — SODIUM CHLORIDE 0.9 % IV BOLUS (SEPSIS)
1000.0000 mL | Freq: Once | INTRAVENOUS | Status: AC
  Administered 2014-03-10: 1000 mL via INTRAVENOUS

## 2014-03-10 NOTE — ED Notes (Addendum)
Requested urine from pt. Pt states he is unable to at this time.

## 2014-03-10 NOTE — ED Notes (Signed)
Pt reports that he has been having chest pain and some numbness in his hands for the past couple of days. Denies any SOB, or n/v. Pt reports that he has also had some blisters on his chest recently.

## 2014-03-10 NOTE — ED Notes (Signed)
Per MD: pt also has foreskin that won't retract for appx 2 months.

## 2014-03-10 NOTE — ED Notes (Addendum)
Pt now reports lower abdominal since sitting in the waiting area; states "it has hurt for a couple of days. My lower back kind of hurts in the morning. MY big toe on my right foot is hurting. I also have had this rash for about two months that isn't going away." Md Wofford at bedside assessing. Pt in NAD. Family at bedside. VSS; sinus rhythm on monitor. Pt denies CP at this time.

## 2014-03-10 NOTE — ED Provider Notes (Signed)
CSN: [id number]     Arrival date & time 03/10/14  1642 History   First MD Initiated Contact with Patient 03/10/14 2112     Chief Complaint  Patient presents with  . Chest Pain     (Consider location/radiation/quality/duration/timing/severity/associated sxs/prior Treatment) Patient is a 48 y.o. male presenting with chest pain and back pain.  Chest Pain Pain location:  L chest Pain quality: sharp   Pain radiates to:  Does not radiate Pain severity:  Moderate Onset quality:  Sudden Duration: A few minutes at a time. Initially started last week. Timing:  Intermittent Progression:  Unchanged Chronicity:  New Relieved by:  Nothing Worsened by:  Nothing tried Associated symptoms: abdominal pain and back pain   Associated symptoms: no cough, no dizziness, no fever, no nausea, no numbness, no shortness of breath and not vomiting   Back Pain Location:  Lumbar spine Quality:  Aching Radiates to:  Does not radiate Pain severity:  Mild Associated symptoms: abdominal pain and chest pain   Associated symptoms: no bladder incontinence, no bowel incontinence, no fever, no numbness, no paresthesias and no perianal numbness     Past Medical History  Diagnosis Date  . Diabetes mellitus    History reviewed. No pertinent past surgical history. History reviewed. No pertinent family history. History  Substance Use Topics  . Smoking status: Never Smoker   . Smokeless tobacco: Not on file  . Alcohol Use: No    Review of Systems  Constitutional: Negative for fever.  Respiratory: Negative for cough and shortness of breath.   Cardiovascular: Positive for chest pain.  Gastrointestinal: Positive for abdominal pain. Negative for nausea, vomiting, diarrhea and bowel incontinence.  Genitourinary: Negative for bladder incontinence.  Musculoskeletal: Positive for back pain.  Neurological: Negative for dizziness, numbness and paresthesias.  All other systems reviewed and are  negative.      Allergies  Review of patient's allergies indicates no known allergies.  Home Medications   Current Outpatient Rx  Name  Route  Sig  Dispense  Refill  . metFORMIN (GLUCOPHAGE) 1000 MG tablet   Oral   Take 1 tablet (1,000 mg total) by mouth 2 (two) times daily with a meal.   60 tablet   3    BP 137/91  Pulse 86  Temp(Src) 97.5 F (36.4 C) (Oral)  Resp 21  Ht 5\' 8"  (1.727 m)  Wt 191 lb (86.637 kg)  BMI 29.05 kg/m2  SpO2 98% Physical Exam  Nursing note and vitals reviewed. Constitutional: He is oriented to person, place, and time. He appears well-developed and well-nourished. No distress.  HENT:  Head: Normocephalic and atraumatic.  Mouth/Throat: Oropharynx is clear and moist.  Eyes: Conjunctivae are normal. Pupils are equal, round, and reactive to light. No scleral icterus.  Neck: Neck supple.  Cardiovascular: Normal rate, regular rhythm, normal heart sounds and intact distal pulses.   No murmur heard. Pulmonary/Chest: Effort normal and breath sounds normal. No stridor. No respiratory distress. He has no wheezes. He has no rales.  Abdominal: Soft. He exhibits no distension. There is tenderness (Mild) in the suprapubic area. There is no rebound, no guarding and no CVA tenderness. Hernia confirmed negative in the right inguinal area and confirmed negative in the left inguinal area.  Genitourinary: Right testis shows no mass, no swelling and no tenderness. Left testis shows no mass, no swelling and no tenderness. Uncircumcised. Phimosis present. No penile erythema or penile tenderness. No discharge found.  Musculoskeletal: Normal range of motion. He exhibits no  edema.       Lumbar back: He exhibits tenderness (Mild).  Neurological: He is alert and oriented to person, place, and time.  Skin: Skin is warm and dry. No rash noted.  Psychiatric: He has a normal mood and affect. His behavior is normal.    ED Course  Procedures (including critical care time) Labs  Review Labs Reviewed  CBC - Abnormal; Notable for the following:    MCV 76.0 (*)    MCH 25.2 (*)    All other components within normal limits  BASIC METABOLIC PANEL - Abnormal; Notable for the following:    Sodium 136 (*)    Glucose, Bld 483 (*)    All other components within normal limits  URINALYSIS, ROUTINE W REFLEX MICROSCOPIC  I-STAT TROPOININ, ED   Imaging Review Dg Chest 2 View  03/10/2014   CLINICAL DATA:  Chest pain  EXAM: CHEST  2 VIEW  COMPARISON:  February 06, 2012  FINDINGS: There is minimal left base atelectasis. Lungs elsewhere are clear. Heart size and pulmonary vascularity are normal. No pneumothorax. No adenopathy. No bone lesions.  IMPRESSION: Slight left base atelectasis.  No edema or consolidation.   Electronically Signed   By: Lowella Grip M.D.   On: 03/10/2014 18:24  All radiology studies independently viewed by me.      EKG Interpretation   Date/Time:  Monday March 10 2014 16:49:08 EDT Ventricular Rate:  95 PR Interval:  168 QRS Duration: 84 QT Interval:  362 QTC Calculation: 454 R Axis:   33 Text Interpretation:  Normal sinus rhythm Nonspecific ST and T wave  abnormality Abnormal ECG No significant change was found Confirmed by  [facility]  MD, TREY ([id number]) on 03/10/2014 9:28:47 PM      MDM   Final diagnoses:  Chest pain  Hyperglycemia  Phimosis    48 year old male with complaints of pain in multiple locations including left chest, lower abdomen, back, bilateral legs. He is a poor, vague historian and his complaints of vague and nonspecific.  He was not having pain on my initial exam. His workup is notable for hyperglycemia. This could be a source of some of his malaise.  I do not think his symptoms represent ACS, PE, or dissection.  He is not septic or toxic.  He does not appear to be in DKA.  His only significant physical exam finding was a phimosis which he thinks has been present for at least several months, maybe longer.  He is able to urinate.   UA negative for infection.  His GU exam showed no tenderness or crepitus.  Maybe mild balanoposthitis which will be treated with clotrimazole.  No evidence of Fournier's Gangrene.   Blood sugar improved with fluids.  Advised close outpatient followup.  Houston Siren III, MD 03/11/14 [phone]

## 2014-03-11 LAB — I-STAT TROPONIN, ED: Troponin i, poc: 0.01 ng/mL (ref 0.00–0.08)

## 2014-03-11 LAB — CBG MONITORING, ED: Glucose-Capillary: 321 mg/dL — ABNORMAL HIGH (ref 70–99)

## 2014-03-11 MED ORDER — CLOTRIMAZOLE 1 % EX CREA: TOPICAL_CREAM | CUTANEOUS | Status: DC

## 2014-03-11 NOTE — Discharge Instructions (Signed)
Chest Pain (Nonspecific) °It is often hard to give a specific diagnosis for the cause of chest pain. There is always a chance that your pain could be related to something serious, such as a heart attack or a blood clot in the lungs. You need to follow up with your caregiver for further evaluation. °CAUSES  °· Heartburn. °· Pneumonia or bronchitis. °· Anxiety or stress. °· Inflammation around your heart (pericarditis) or lung (pleuritis or pleurisy). °· A blood clot in the lung. °· A collapsed lung (pneumothorax). It can develop suddenly on its own (spontaneous pneumothorax) or from injury (trauma) to the chest. °· Shingles infection (herpes zoster virus). °The chest wall is composed of bones, muscles, and cartilage. Any of these can be the source of the pain. °· The bones can be bruised by injury. °· The muscles or cartilage can be strained by coughing or overwork. °· The cartilage can be affected by inflammation and become sore (costochondritis). °DIAGNOSIS  °Lab tests or other studies, such as X-rays, electrocardiography, stress testing, or cardiac imaging, may be needed to find the cause of your pain.  °TREATMENT  °· Treatment depends on what may be causing your chest pain. Treatment may include: °· Acid blockers for heartburn. °· Anti-inflammatory medicine. °· Pain medicine for inflammatory conditions. °· Antibiotics if an infection is present. °· You may be advised to change lifestyle habits. This includes stopping smoking and avoiding alcohol, caffeine, and chocolate. °· You may be advised to keep your head raised (elevated) when sleeping. This reduces the chance of acid going backward from your stomach into your esophagus. °· Most of the time, nonspecific chest pain will improve within 2 to 3 days with rest and mild pain medicine. °HOME CARE INSTRUCTIONS  °· If antibiotics were prescribed, take your antibiotics as directed. Finish them even if you start to feel better. °· For the next few days, avoid physical  activities that bring on chest pain. Continue physical activities as directed. °· Do not smoke. °· Avoid drinking alcohol. °· Only take over-the-counter or prescription medicine for pain, discomfort, or fever as directed by your caregiver. °· Follow your caregiver's suggestions for further testing if your chest pain does not go away. °· Keep any follow-up appointments you made. If you do not go to an appointment, you could develop lasting (chronic) problems with pain. If there is any problem keeping an appointment, you must call to reschedule. °SEEK MEDICAL CARE IF:  °· You think you are having problems from the medicine you are taking. Read your medicine instructions carefully. °· Your chest pain does not go away, even after treatment. °· You develop a rash with blisters on your chest. °SEEK IMMEDIATE MEDICAL CARE IF:  °· You have increased chest pain or pain that spreads to your arm, neck, jaw, back, or abdomen. °· You develop shortness of breath, an increasing cough, or you are coughing up blood. °· You have severe back or abdominal pain, feel nauseous, or vomit. °· You develop severe weakness, fainting, or chills. °· You have a fever. °THIS IS AN EMERGENCY. Do not wait to see if the pain will go away. Get medical help at once. Call your local emergency services (911 in [place] Do not drive yourself to the hospital. °MAKE SURE YOU:  °· Understand these instructions. °· Will watch your condition. °· Will get help right away if you are not doing well or get worse. °Document Released: 09/07/2005 Document Revised: 02/20/2012 Document Reviewed: 07/03/2008 °Exit[employer]® Patient Information ©2014 [employer]   [employer].  Hyperglycemia Hyperglycemia occurs when the glucose (sugar) in your blood is too high. Hyperglycemia can happen for many reasons, but it most often happens to people who do not know they have diabetes or are not managing their diabetes properly.  CAUSES  Whether you have diabetes or not, there are other causes  of hyperglycemia. Hyperglycemia can occur when you have diabetes, but it can also occur in other situations that you might not be as aware of, such as: Diabetes  If you have diabetes and are having problems controlling your blood glucose, hyperglycemia could occur because of some of the following reasons:  Not following your meal plan.  Not taking your diabetes medications or not taking it properly.  Exercising less or doing less activity than you normally do.  Being sick. Pre-diabetes  This cannot be ignored. Before people develop Type 2 diabetes, they almost always have "pre-diabetes." This is when your blood glucose levels are higher than normal, but not yet high enough to be diagnosed as diabetes. Research has shown that some long-term damage to the body, especially the heart and circulatory system, may already be occurring during pre-diabetes. If you take action to manage your blood glucose when you have pre-diabetes, you may delay or prevent Type 2 diabetes from developing. Stress  If you have diabetes, you may be "diet" controlled or on oral medications or insulin to control your diabetes. However, you may find that your blood glucose is higher than usual in the hospital whether you have diabetes or not. This is often referred to as "stress hyperglycemia." Stress can elevate your blood glucose. This happens because of hormones put out by the body during times of stress. If stress has been the cause of your high blood glucose, it can be followed regularly by your caregiver. That way he/she can make sure your hyperglycemia does not continue to get worse or progress to diabetes. Steroids  Steroids are medications that act on the infection fighting system (immune system) to block inflammation or infection. One side effect can be a rise in blood glucose. Most people can produce enough extra insulin to allow for this rise, but for those who cannot, steroids make blood glucose levels go even  higher. It is not unusual for steroid treatments to "uncover" diabetes that is developing. It is not always possible to determine if the hyperglycemia will go away after the steroids are stopped. A special blood test called an A1c is sometimes done to determine if your blood glucose was elevated before the steroids were started. SYMPTOMS  Thirsty.  Frequent urination.  Dry mouth.  Blurred vision.  Tired or fatigue.  Weakness.  Sleepy.  Tingling in feet or leg. DIAGNOSIS  Diagnosis is made by monitoring blood glucose in one or all of the following ways:  A1c test. This is a chemical found in your blood.  Fingerstick blood glucose monitoring.  Laboratory results. TREATMENT  First, knowing the cause of the hyperglycemia is important before the hyperglycemia can be treated. Treatment may include, but is not be limited to:  Education.  Change or adjustment in medications.  Change or adjustment in meal plan.  Treatment for an illness, infection, etc.  More frequent blood glucose monitoring.  Change in exercise plan.  Decreasing or stopping steroids.  Lifestyle changes. HOME CARE INSTRUCTIONS   Test your blood glucose as directed.  Exercise regularly. Your caregiver will give you instructions about exercise. Pre-diabetes or diabetes which comes on with stress is helped by exercising.  Eat wholesome, balanced meals. Eat often and at regular, fixed times. Your caregiver or nutritionist will give you a meal plan to guide your sugar intake.  Being at an ideal weight is important. If needed, losing as little as 10 to 15 pounds may help improve blood glucose levels. SEEK MEDICAL CARE IF:   You have questions about medicine, activity, or diet.  You continue to have symptoms (problems such as increased thirst, urination, or weight gain). SEEK IMMEDIATE MEDICAL CARE IF:   You are vomiting or have diarrhea.  Your breath smells fruity.  You are breathing faster or  slower.  You are very sleepy or incoherent.  You have numbness, tingling, or pain in your feet or hands.  You have chest pain.  Your symptoms get worse even though you have been following your caregiver's orders.  If you have any other questions or concerns. Document Released: 05/24/2001 Document Revised: 02/20/2012 Document Reviewed: 03/26/2012 [facility] Patient Information 2014 [place], [place].  Phimosis You or your child has been diagnosed as having phimosis. Phimosis is a tightening (constricting) of the foreskin over the head of the penis. In an uncircumcised male, the foreskin may be so tight that it cannot be easily pulled back over the head of the penis. This is common in young boys (up to 49 years old), but may occur at any age. As long as the child can pass urine, no treatment is needed immediately. This condition should improve by itself as he gets older. It may follow infection or injury, or occur from poor cleaning under the foreskin. Your caregiver may recommend circumcision (removal of part of the foreskin). These are individual preferences which can be decided upon between you and your caregiver. HOME CARE INSTRUCTIONS   Do not try to force back the foreskin. This may cause scarring and make the condition worse.  Clean under the foreskin regularly.  In uncircumcised babies, the foreskin is normally tight. It usually does not start to loosen enough to pull back until the baby is at least 13 months old. Until then, treat as your caregiver directs. Later, you may gently pull back the foreskin during bathing to wash the penis. SEEK MEDICAL CARE IF:   There is redness, swelling, or drainage from the foreskin. These are signs of infection.  You or your child has pain when passing urine.  An unexplained oral temperature above 102 F (38.9 C) develops. SEEK IMMEDIATE MEDICAL CARE IF:  Your child has not passed urine in 24 hours.  An unexplained oral temperature above 102 F  (38.9 C) develops, not controlled by medication. Document Released: 11/25/2000 Document Revised: 02/20/2012 Document Reviewed: 04/22/2009 [facility] Patient Information 2014 [place], [place].

## 2014-04-21 ENCOUNTER — Ambulatory Visit: Payer: Self-pay | Attending: Internal Medicine | Admitting: Internal Medicine

## 2014-04-21 ENCOUNTER — Encounter: Payer: Self-pay | Admitting: Internal Medicine

## 2014-04-21 VITALS — BP 130/87 | HR 76 | Temp 98.7°F | Resp 16 | Wt 181.6 lb

## 2014-04-21 DIAGNOSIS — L02439 Carbuncle of limb, unspecified: Secondary | ICD-10-CM | POA: Insufficient documentation

## 2014-04-21 DIAGNOSIS — L0292 Furuncle, unspecified: Secondary | ICD-10-CM

## 2014-04-21 DIAGNOSIS — IMO0001 Reserved for inherently not codable concepts without codable children: Secondary | ICD-10-CM | POA: Insufficient documentation

## 2014-04-21 DIAGNOSIS — L0293 Carbuncle, unspecified: Secondary | ICD-10-CM

## 2014-04-21 DIAGNOSIS — H538 Other visual disturbances: Secondary | ICD-10-CM | POA: Insufficient documentation

## 2014-04-21 DIAGNOSIS — E119 Type 2 diabetes mellitus without complications: Secondary | ICD-10-CM

## 2014-04-21 DIAGNOSIS — L02429 Furuncle of limb, unspecified: Secondary | ICD-10-CM | POA: Insufficient documentation

## 2014-04-21 DIAGNOSIS — E1165 Type 2 diabetes mellitus with hyperglycemia: Principal | ICD-10-CM

## 2014-04-21 LAB — POCT GLYCOSYLATED HEMOGLOBIN (HGB A1C): Hemoglobin A1C: >14

## 2014-04-21 LAB — GLUCOSE, POCT (MANUAL RESULT ENTRY): POC Glucose: 289 mg/dl — AB (ref 70–99)

## 2014-04-21 MED ORDER — INSULIN ASPART 100 UNIT/ML ~~LOC~~ SOLN
10.0000 [IU] | Freq: Once | SUBCUTANEOUS | Status: AC
  Administered 2014-04-21: 10 [IU] via SUBCUTANEOUS

## 2014-04-21 MED ORDER — SULFAMETHOXAZOLE-TMP DS 800-160 MG PO TABS: 1.0000 | ORAL_TABLET | Freq: Two times a day (BID) | ORAL | Status: DC

## 2014-04-21 MED ORDER — INSULIN NPH ISOPHANE & REGULAR (70-30) 100 UNIT/ML ~~LOC~~ SUSP: 15.0000 [IU] | Freq: Two times a day (BID) | SUBCUTANEOUS | Status: DC

## 2014-04-21 MED ORDER — METFORMIN HCL 1000 MG PO TABS: 1000.0000 mg | ORAL_TABLET | Freq: Two times a day (BID) | ORAL | Status: DC

## 2014-04-21 NOTE — Patient Instructions (Signed)

## 2014-04-21 NOTE — Progress Notes (Signed)
Patient here for follow up from ED States his blood sugars have been running high Currently only taking metformin Complains of having rash on his torso  And arms

## 2014-04-21 NOTE — Progress Notes (Signed)
MRN: [id number] Name: Jeffrey [name]  Sex: male Age: 48 y.o. DOB: Dec 15, 1965  Allergies: Review of patient's allergies indicates no known allergies.  Chief Complaint  Patient presents with  . Follow-up    HPI: Patient is 48 y.o. male who has to of diabetes for 5 years as per patient he is only taking metformin his fasting sugar is usually high also he went to the emergency room a few months ago, he also reported to have blurry vision for several months has not been able to schedule appointment with ophthalmology, denies any chest pain or shortness of breath denies smoking cigarettes, reported to have noticed boils on his both arms and abdomen occasionally it drains , denies any fever chills.  Past Medical History  Diagnosis Date  . Diabetes mellitus     History reviewed. No pertinent past surgical history.    Medication List       This list is accurate as of: 04/21/14  3:38 PM.  Always use your most recent med list.               clotrimazole 1 % cream  Commonly known as:  LOTRIMIN  Apply to affected area 2 times daily     insulin NPH-regular Human (70-30) 100 UNIT/ML injection  Commonly known as:  NOVOLIN 70/30  Inject 15 Units into the skin 2 (two) times daily with a meal.     metFORMIN 1000 MG tablet  Commonly known as:  GLUCOPHAGE  Take 1 tablet (1,000 mg total) by mouth 2 (two) times daily with a meal.     sulfamethoxazole-trimethoprim 800-160 MG per tablet  Commonly known as:  BACTRIM DS  Take 1 tablet by mouth 2 (two) times daily.        Meds ordered this encounter  Medications  . insulin aspart (novoLOG) injection 10 Units    Sig:     Per office protocol  . DISCONTD: insulin NPH-regular Human (NOVOLIN 70/30) (70-30) 100 UNIT/ML injection    Sig: Inject 15 Units into the skin 2 (two) times daily with a meal.    Dispense:  10 mL    Refill:  11  . metFORMIN (GLUCOPHAGE) 1000 MG tablet    Sig: Take 1 tablet (1,000 mg total) by mouth 2 (two) times  daily with a meal.    Dispense:  60 tablet    Refill:  3  . sulfamethoxazole-trimethoprim (BACTRIM DS) 800-160 MG per tablet    Sig: Take 1 tablet by mouth 2 (two) times daily.    Dispense:  20 tablet    Refill:  0  . insulin NPH-regular Human (NOVOLIN 70/30) (70-30) 100 UNIT/ML injection    Sig: Inject 15 Units into the skin 2 (two) times daily with a meal.    Dispense:  10 mL    Refill:  11     There is no immunization history on file for this patient.  Family History  Problem Relation Age of Onset  . Diabetes Mother   . Diabetes Father   . Diabetes Sister     History  Substance Use Topics  . Smoking status: Never Smoker   . Smokeless tobacco: Not on file  . Alcohol Use: No    Review of Systems   As noted in HPI  Filed Vitals:   04/21/14 1505  BP: 130/87  Pulse: 76  Temp: 98.7 F (37.1 C)  Resp: 16    Physical Exam  Physical Exam  Constitutional: No distress.  Eyes: EOM are normal. Pupils are equal, round, and reactive to light.  Neck: Neck supple.  Cardiovascular: Normal rate and regular rhythm.   Pulmonary/Chest: Breath sounds normal. No respiratory distress. He has no wheezes. He has no rales.  Musculoskeletal: He exhibits no edema.  Skin:  Boils on both arms    CBC    Component Value Date/Time   WBC 5.1 03/10/2014 1651   RBC 5.51 03/10/2014 1651   HGB 13.9 03/10/2014 1651   HCT 41.9 03/10/2014 1651   PLT 250 03/10/2014 1651   MCV 76.0* 03/10/2014 1651   LYMPHSABS 1.3 08/21/2013 1204   MONOABS 0.2 08/21/2013 1204   EOSABS 0.0 08/21/2013 1204   BASOSABS 0.0 08/21/2013 1204    CMP     Component Value Date/Time   NA 136* 03/10/2014 1651   K 3.8 03/10/2014 1651   CL 98 03/10/2014 1651   CO2 24 03/10/2014 1651   GLUCOSE 483* 03/10/2014 1651   BUN 9 03/10/2014 1651   CREATININE 0.67 03/10/2014 1651   CREATININE 0.85 08/21/2013 1204   CALCIUM 8.7 03/10/2014 1651   PROT 7.6 07/12/2013 2238   ALBUMIN 3.4* 07/12/2013 2238   AST 10 07/12/2013 2238   ALT 9  07/12/2013 2238   ALKPHOS 99 07/12/2013 2238   BILITOT 0.2* 07/12/2013 2238   GFRNONAA >90 03/10/2014 1651   GFRAA >90 03/10/2014 1651    Lab Results  Component Value Date/Time   CHOL 191 08/21/2013 12:04 PM    No components found with this basename: hga1c    Lab Results  Component Value Date/Time   AST 10 07/12/2013 10:38 PM    Assessment and Plan  DM (diabetes mellitus) - Plan:  Results for orders placed in visit on 04/21/14  GLUCOSE, POCT (MANUAL RESULT ENTRY)      Result Value Ref Range   POC Glucose 289 (*) 70 - 99 mg/dl  POCT GLYCOSYLATED HEMOGLOBIN (HGB A1C)      Result Value Ref Range   Hemoglobin A1C >14%     Diabetes is uncontrolled patient is only taking metformin 1 g twice a day, I have counseled patient about diabetes meal planning also started patient on NovoLin 70/30 15 units twice a day, advise patient to keep the fingerstick log. Her patient will follow up with clinical pharmacist for further diabetes education and optimization of treatment in 6 weeks.   Glucose (CBG), HgB A1c, insulin aspart (novoLOG) injection 10 Units, Ambulatory referral to Ophthalmology, metFORMIN (GLUCOPHAGE) 1000 MG tablet, insulin NPH-regular Human (NOVOLIN 70/30) (70-30) 100 UNIT/ML injection, DISCONTINUED: insulin NPH-regular Human (NOVOLIN 70/30) (70-30) 100 UNIT/ML injection  Blurry vision - Plan: Ambulatory referral to Ophthalmology  Boils - Plan: sulfamethoxazole-trimethoprim (BACTRIM DS) 800-160 MG per tablet  We'll check fasting lipid panel on the next visit.  Return in about 4 months (around 08/22/2014) for schedule apt with courtney in 6 weeks, diabetes.  Lorayne Marek, MD

## 2014-07-23 ENCOUNTER — Ambulatory Visit: Payer: Self-pay | Admitting: Internal Medicine

## 2014-08-07 ENCOUNTER — Telehealth: Payer: Self-pay | Admitting: Internal Medicine

## 2014-08-07 NOTE — Telephone Encounter (Signed)
Patient has come in today to request a medication review appointment following a specialist visit to [facility]; Patient stated that the specialist instructed him to stop taking his current insulin and start a new form of insulin; please f/u with patient asap as he does not have any more insulin left;

## 2014-08-19 ENCOUNTER — Ambulatory Visit: Payer: Self-pay | Admitting: Internal Medicine

## 2014-08-19 DIAGNOSIS — L02212 Cutaneous abscess of back [any part, except buttock]: Secondary | ICD-10-CM | POA: Insufficient documentation

## 2014-08-26 ENCOUNTER — Ambulatory Visit: Payer: Self-pay | Attending: Internal Medicine | Admitting: Internal Medicine

## 2014-08-26 ENCOUNTER — Ambulatory Visit: Payer: Self-pay | Admitting: Internal Medicine

## 2014-08-26 ENCOUNTER — Encounter: Payer: Self-pay | Admitting: Internal Medicine

## 2014-08-26 VITALS — BP 162/96 | HR 99 | Temp 98.6°F | Resp 16 | Wt 187.0 lb

## 2014-08-26 DIAGNOSIS — Z794 Long term (current) use of insulin: Secondary | ICD-10-CM | POA: Insufficient documentation

## 2014-08-26 DIAGNOSIS — I10 Essential (primary) hypertension: Secondary | ICD-10-CM | POA: Insufficient documentation

## 2014-08-26 DIAGNOSIS — E089 Diabetes mellitus due to underlying condition without complications: Secondary | ICD-10-CM

## 2014-08-26 DIAGNOSIS — E139 Other specified diabetes mellitus without complications: Secondary | ICD-10-CM

## 2014-08-26 DIAGNOSIS — Z09 Encounter for follow-up examination after completed treatment for conditions other than malignant neoplasm: Secondary | ICD-10-CM | POA: Insufficient documentation

## 2014-08-26 DIAGNOSIS — E119 Type 2 diabetes mellitus without complications: Secondary | ICD-10-CM | POA: Insufficient documentation

## 2014-08-26 DIAGNOSIS — L02219 Cutaneous abscess of trunk, unspecified: Secondary | ICD-10-CM

## 2014-08-26 DIAGNOSIS — L02212 Cutaneous abscess of back [any part, except buttock]: Secondary | ICD-10-CM

## 2014-08-26 DIAGNOSIS — L03319 Cellulitis of trunk, unspecified: Secondary | ICD-10-CM

## 2014-08-26 LAB — COMPLETE METABOLIC PANEL WITH GFR
ALBUMIN: 3.9 g/dL (ref 3.5–5.2)
ALT: <8 U/L (ref 0–53)
AST: 11 U/L (ref 0–37)
Alkaline Phosphatase: 69 U/L (ref 39–117)
BUN: 7 mg/dL (ref 6–23)
CALCIUM: 9.1 mg/dL (ref 8.4–10.5)
CHLORIDE: 100 meq/L (ref 96–112)
CO2: 27 mEq/L (ref 19–32)
Creat: 0.7 mg/dL (ref 0.50–1.35)
GFR, Est African American: >89 mL/min
GFR, Est Non African American: >89 mL/min
GLUCOSE: 261 mg/dL — AB (ref 70–99)
POTASSIUM: 3.8 meq/L (ref 3.5–5.3)
Sodium: 135 mEq/L (ref 135–145)
Total Bilirubin: 0.6 mg/dL (ref 0.2–1.2)
Total Protein: 7.5 g/dL (ref 6.0–8.3)

## 2014-08-26 LAB — POCT GLYCOSYLATED HEMOGLOBIN (HGB A1C): Hemoglobin A1C: 9.9

## 2014-08-26 LAB — GLUCOSE, POCT (MANUAL RESULT ENTRY): POC Glucose: 268 mg/dl — AB (ref 70–99)

## 2014-08-26 MED ORDER — LISINOPRIL 5 MG PO TABS: 5.0000 mg | ORAL_TABLET | Freq: Every day | ORAL | Status: DC

## 2014-08-26 NOTE — Progress Notes (Signed)
Patient here for follow up from hospital Was admitted with fever and some type of infection Has a picc line to the right arm for ABT

## 2014-08-26 NOTE — Progress Notes (Signed)
MRN: [id number] Name: Jeffrey [name]  Sex: male Age: 48 y.o. DOB: 06-Jan-1966  Allergies: Review of patient's allergies indicates no known allergies.  Chief Complaint  Patient presents with  . Follow-up    HPI: Patient is 49 y.o. male who has history of diabetes comes today for followup as per patient he was hospitalized at [facility] last month at that time he had abscess on his back which was drained subsequently was started on IV antibiotics and currently has PICC line and her following up with ID, and currently denies any fever chills chest and shortness of breath, patient used to be on insulin 70/30 which was changed to Lantus and Humalog, his A1c is trending down, denies any hypoglycemic symptoms, his oral hypoglycemics were discontinued today's blood pressure is elevated and patient is not on any blood pressure medications.   Past Medical History  Diagnosis Date  . Diabetes mellitus     History reviewed. No pertinent past surgical history.    Medication List       This list is accurate as of: 08/26/14  5:56 PM.  Always use your most recent med list.               clotrimazole 1 % cream  Commonly known as:  LOTRIMIN  Apply to affected area 2 times daily     insulin glargine 100 UNIT/ML injection  Commonly known as:  LANTUS  Inject 25 Units into the skin daily.     insulin lispro 100 UNIT/ML injection  Commonly known as:  HUMALOG  Inject 8 Units into the skin 4 (four) times daily.     lisinopril 5 MG tablet  Commonly known as:  PRINIVIL,ZESTRIL  Take 1 tablet (5 mg total) by mouth daily.     sulfamethoxazole-trimethoprim 800-160 MG per tablet  Commonly known as:  BACTRIM DS  Take 1 tablet by mouth 2 (two) times daily.        Meds ordered this encounter  Medications  . insulin glargine (LANTUS) 100 UNIT/ML injection    Sig: Inject 25 Units into the skin daily.  . insulin lispro (HUMALOG) 100 UNIT/ML injection    Sig: Inject 8  Units into the skin 4 (four) times daily.  Marland Kitchen lisinopril (PRINIVIL,ZESTRIL) 5 MG tablet    Sig: Take 1 tablet (5 mg total) by mouth daily.    Dispense:  90 tablet    Refill:  3     There is no immunization history on file for this patient.  Family History  Problem Relation Age of Onset  . Diabetes Mother   . Diabetes Father   . Diabetes Sister     History  Substance Use Topics  . Smoking status: Never Smoker   . Smokeless tobacco: Not on file  . Alcohol Use: No    Review of Systems   As noted in HPI  Filed Vitals:   08/26/14 1708  BP: 162/96  Pulse: 99  Temp: 98.6 F (37 C)  Resp: 16    Physical Exam  Physical Exam  Constitutional: No distress.  Eyes: EOM are normal. Pupils are equal, round, and reactive to light.  Cardiovascular: Normal rate and regular rhythm.   Pulmonary/Chest: Breath sounds normal. No respiratory distress. He has no wheezes. He has no rales.  Musculoskeletal: He exhibits no edema.  Back incision site looks clean no tenderness or any discharge  Right arm PICC line in place    CBC    Component  Value Date/Time   WBC 5.1 03/10/2014 1651   RBC 5.51 03/10/2014 1651   HGB 13.9 03/10/2014 1651   HCT 41.9 03/10/2014 1651   PLT 250 03/10/2014 1651   MCV 76.0* 03/10/2014 1651   LYMPHSABS 1.3 08/21/2013 1204   MONOABS 0.2 08/21/2013 1204   EOSABS 0.0 08/21/2013 1204   BASOSABS 0.0 08/21/2013 1204    CMP     Component Value Date/Time   NA 136* 03/10/2014 1651   K 3.8 03/10/2014 1651   CL 98 03/10/2014 1651   CO2 24 03/10/2014 1651   GLUCOSE 483* 03/10/2014 1651   BUN 9 03/10/2014 1651   CREATININE 0.67 03/10/2014 1651   CREATININE 0.85 08/21/2013 1204   CALCIUM 8.7 03/10/2014 1651   PROT 7.6 07/12/2013 2238   ALBUMIN 3.4* 07/12/2013 2238   AST 10 07/12/2013 2238   ALT 9 07/12/2013 2238   ALKPHOS 99 07/12/2013 2238   BILITOT 0.2* 07/12/2013 2238   GFRNONAA >90 03/10/2014 1651   GFRAA >90 03/10/2014 1651    Lab Results  Component Value Date/Time   CHOL  191 08/21/2013 12:04 PM    No components found with this basename: hga1c    Lab Results  Component Value Date/Time   AST 10 07/12/2013 10:38 PM    Assessment and Plan  Diabetes mellitus due to underlying condition without complications - Plan: Glucose (CBG), HgB A1c  Results for orders placed in visit on 08/26/14  GLUCOSE, POCT (MANUAL RESULT ENTRY)      Result Value Ref Range   POC Glucose 268 (*) 70 - 99 mg/dl  POCT GLYCOSYLATED HEMOGLOBIN (HGB A1C)      Result Value Ref Range   Hemoglobin A1C 9.9     Advised patient for diabetes meal planning currently he is on Lantus 25 units each bedtime and Humalog 8 units 4 times a day, advise patient to keep the fingerstick log, his A1c is improved, we'll check in 3 months.   Essential hypertension - Plan: Have started patient on lisinopril (PRINIVIL,ZESTRIL) 5 MG tablet, will check blood chemistry COMPLETE METABOLIC PANEL WITH GFR, patient will come back in 2 weeks for BP check.  Back abscess Status post incision and drainage currently on IV antibiotic following up with ID.    Return in about 3 months (around 11/25/2014) for diabetes, hypertension, BP check in 2 weeks/Nurse Visit.  Lorayne Marek, MD

## 2014-08-26 NOTE — Patient Instructions (Signed)
Diabetes Mellitus and Food It is important for you to manage your blood sugar (glucose) level. Your blood glucose level can be greatly affected by what you eat. Eating healthier foods in the appropriate amounts throughout the day at about the same time each day will help you control your blood glucose level. It can also help slow or prevent worsening of your diabetes mellitus. Healthy eating may even help you improve the level of your blood pressure and reach or maintain a healthy weight.  HOW CAN FOOD AFFECT ME? Carbohydrates Carbohydrates affect your blood glucose level more than any other type of food. Your dietitian will help you determine how many carbohydrates to eat at each meal and teach you how to count carbohydrates. Counting carbohydrates is important to keep your blood glucose at a healthy level, especially if you are using insulin or taking certain medicines for diabetes mellitus. Alcohol Alcohol can cause sudden decreases in blood glucose (hypoglycemia), especially if you use insulin or take certain medicines for diabetes mellitus. Hypoglycemia can be a life-threatening condition. Symptoms of hypoglycemia (sleepiness, dizziness, and disorientation) are similar to symptoms of having too much alcohol.  If your health care provider has given you approval to drink alcohol, do so in moderation and use the following guidelines:  Women should not have more than one drink per day, and men should not have more than two drinks per day. One drink is equal to:  12 oz of beer.  5 oz of wine.  1 oz of hard liquor.  Do not drink on an empty stomach.  Keep yourself hydrated. Have water, diet soda, or unsweetened iced tea.  Regular soda, juice, and other mixers might contain a lot of carbohydrates and should be counted. WHAT FOODS ARE NOT RECOMMENDED? As you make food choices, it is important to remember that all foods are not the same. Some foods have fewer nutrients per serving than other  foods, even though they might have the same number of calories or carbohydrates. It is difficult to get your body what it needs when you eat foods with fewer nutrients. Examples of foods that you should avoid that are high in calories and carbohydrates but low in nutrients include:  Trans fats (most processed foods list trans fats on the Nutrition Facts label).  Regular soda.  Juice.  Candy.  Sweets, such as cake, pie, doughnuts, and cookies.  Fried foods. WHAT FOODS CAN I EAT? Have nutrient-rich foods, which will nourish your body and keep you healthy. The food you should eat also will depend on several factors, including:  The calories you need.  The medicines you take.  Your weight.  Your blood glucose level.  Your blood pressure level.  Your cholesterol level. You also should eat a variety of foods, including:  Protein, such as meat, poultry, fish, tofu, nuts, and seeds (lean animal proteins are best).  Fruits.  Vegetables.  Dairy products, such as milk, cheese, and yogurt (low fat is best).  Breads, grains, pasta, cereal, rice, and beans.  Fats such as olive oil, trans fat-free margarine, canola oil, avocado, and olives. DOES EVERYONE WITH DIABETES MELLITUS HAVE THE SAME MEAL PLAN? Because every person with diabetes mellitus is different, there is not one meal plan that works for everyone. It is very important that you meet with a dietitian who will help you create a meal plan that is just right for you. Document Released: 08/25/2005 Document Revised: 12/03/2013 Document Reviewed: 10/25/2013 ExitCare Patient Information 2015 [employer]. This   information is not intended to replace advice given to you by your health care provider. Make sure you discuss any questions you have with your health care provider. DASH Eating Plan DASH stands for "Dietary Approaches to Stop Hypertension." The DASH eating plan is a healthy eating plan that has been shown to reduce high  blood pressure (hypertension). Additional health benefits may include reducing the risk of type 2 diabetes mellitus, heart disease, and stroke. The DASH eating plan may also help with weight loss. WHAT DO I NEED TO KNOW ABOUT THE DASH EATING PLAN? For the DASH eating plan, you will follow these general guidelines:  Choose foods with a percent daily value for sodium of less than 5% (as listed on the food label).  Use salt-free seasonings or herbs instead of table salt or sea salt.  Check with your health care provider or pharmacist before using salt substitutes.  Eat lower-sodium products, often labeled as "lower sodium" or "no salt added."  Eat fresh foods.  Eat more vegetables, fruits, and low-fat dairy products.  Choose whole grains. Look for the word "whole" as the first word in the ingredient list.  Choose fish and skinless chicken or turkey more often than red meat. Limit fish, poultry, and meat to 6 oz (170 g) each day.  Limit sweets, desserts, sugars, and sugary drinks.  Choose heart-healthy fats.  Limit cheese to 1 oz (28 g) per day.  Eat more home-cooked food and less restaurant, buffet, and fast food.  Limit fried foods.  Cook foods using methods other than frying.  Limit canned vegetables. If you do use them, rinse them well to decrease the sodium.  When eating at a restaurant, ask that your food be prepared with less salt, or no salt if possible. WHAT FOODS CAN I EAT? Seek help from a dietitian for individual calorie needs. Grains Whole grain or whole wheat bread. Brown rice. Whole grain or whole wheat pasta. Quinoa, bulgur, and whole grain cereals. Low-sodium cereals. Corn or whole wheat flour tortillas. Whole grain cornbread. Whole grain crackers. Low-sodium crackers. Vegetables Fresh or frozen vegetables (raw, steamed, roasted, or grilled). Low-sodium or reduced-sodium tomato and vegetable juices. Low-sodium or reduced-sodium tomato sauce and paste. Low-sodium  or reduced-sodium canned vegetables.  Fruits All fresh, canned (in natural juice), or frozen fruits. Meat and Other Protein Products Ground beef (85% or leaner), grass-fed beef, or beef trimmed of fat. Skinless chicken or turkey. Ground chicken or turkey. Pork trimmed of fat. All fish and seafood. Eggs. Dried beans, peas, or lentils. Unsalted nuts and seeds. Unsalted canned beans. Dairy Low-fat dairy products, such as skim or 1% milk, 2% or reduced-fat cheeses, low-fat ricotta or cottage cheese, or plain low-fat yogurt. Low-sodium or reduced-sodium cheeses. Fats and Oils Tub margarines without trans fats. Light or reduced-fat mayonnaise and salad dressings (reduced sodium). Avocado. Safflower, olive, or canola oils. Natural peanut or almond butter. Other Unsalted popcorn and pretzels. The items listed above may not be a complete list of recommended foods or beverages. Contact your dietitian for more options. WHAT FOODS ARE NOT RECOMMENDED? Grains White bread. White pasta. White rice. Refined cornbread. Bagels and croissants. Crackers that contain trans fat. Vegetables Creamed or fried vegetables. Vegetables in a cheese sauce. Regular canned vegetables. Regular canned tomato sauce and paste. Regular tomato and vegetable juices. Fruits Dried fruits. Canned fruit in light or heavy syrup. Fruit juice. Meat and Other Protein Products Fatty cuts of meat. Ribs, chicken wings, bacon, sausage, bologna, salami, chitterlings, fatback, hot   dogs, bratwurst, and packaged luncheon meats. Salted nuts and seeds. Canned beans with salt. Dairy Whole or 2% milk, cream, half-and-half, and cream cheese. Whole-fat or sweetened yogurt. Full-fat cheeses or blue cheese. Nondairy creamers and whipped toppings. Processed cheese, cheese spreads, or cheese curds. Condiments Onion and garlic salt, seasoned salt, table salt, and sea salt. Canned and packaged gravies. Worcestershire sauce. Tartar sauce. Barbecue sauce.  Teriyaki sauce. Soy sauce, including reduced sodium. Steak sauce. Fish sauce. Oyster sauce. Cocktail sauce. Horseradish. Ketchup and mustard. Meat flavorings and tenderizers. Bouillon cubes. Hot sauce. Tabasco sauce. Marinades. Taco seasonings. Relishes. Fats and Oils Butter, stick margarine, lard, shortening, ghee, and bacon fat. Coconut, palm kernel, or palm oils. Regular salad dressings. Other Pickles and olives. Salted popcorn and pretzels. The items listed above may not be a complete list of foods and beverages to avoid. Contact your dietitian for more information. WHERE CAN I FIND MORE INFORMATION? National Heart, Lung, and [employer] Institute: www.nhlbi.nih.gov/health/health-topics/topics/dash/ Document Released: 11/17/2011 Document Revised: 04/14/2014 Document Reviewed: 10/02/2013 ExitCare Patient Information 2015 [employer]. This information is not intended to replace advice given to you by your health care provider. Make sure you discuss any questions you have with your health care provider.  

## 2014-09-17 ENCOUNTER — Ambulatory Visit: Payer: Self-pay | Attending: Internal Medicine | Admitting: Internal Medicine

## 2014-09-17 ENCOUNTER — Encounter: Payer: Self-pay | Admitting: Internal Medicine

## 2014-09-17 VITALS — BP 115/80 | HR 84 | Temp 98.4°F | Resp 16 | Wt 184.0 lb

## 2014-09-17 DIAGNOSIS — L02212 Cutaneous abscess of back [any part, except buttock]: Secondary | ICD-10-CM | POA: Insufficient documentation

## 2014-09-17 DIAGNOSIS — Z794 Long term (current) use of insulin: Secondary | ICD-10-CM | POA: Insufficient documentation

## 2014-09-17 DIAGNOSIS — I10 Essential (primary) hypertension: Secondary | ICD-10-CM | POA: Insufficient documentation

## 2014-09-17 DIAGNOSIS — E089 Diabetes mellitus due to underlying condition without complications: Secondary | ICD-10-CM

## 2014-09-17 DIAGNOSIS — L0292 Furuncle, unspecified: Secondary | ICD-10-CM

## 2014-09-17 DIAGNOSIS — E119 Type 2 diabetes mellitus without complications: Secondary | ICD-10-CM | POA: Insufficient documentation

## 2014-09-17 DIAGNOSIS — Z23 Encounter for immunization: Secondary | ICD-10-CM | POA: Insufficient documentation

## 2014-09-17 LAB — GLUCOSE, POCT (MANUAL RESULT ENTRY): POC GLUCOSE: 281 mg/dL — AB (ref 70–99)

## 2014-09-17 MED ORDER — CEPHALEXIN 500 MG PO CAPS: 500.0000 mg | ORAL_CAPSULE | Freq: Four times a day (QID) | ORAL | Status: DC

## 2014-09-17 NOTE — Progress Notes (Signed)
MRN: [id number] Name: Jeffrey [name]  Sex: male Age: 48 y.o. DOB: March 22, 1966  Allergies: Review of patient's allergies indicates no known allergies.  Chief Complaint  Patient presents with  . Follow-up    HPI: Patient is 48 y.o. male who was hospitalized at [facility] in August and had an abscess drained on the back was on IV antibiotic nafcillin had a PICC line in place which was removed 2-3 weeks ago, he already followed up with the infectious disease doctor 1 week ago, as per family member did notice some discharge in the incision site 2-3 days ago patient denies any fever chills, patient was not given any oral antibiotics after he finished IV antibiotics, patient has history of diabetes and is taking Lantus and Humalog, reports improvement in the blood sugar level, history of hypertension blood pressure is well controlled.   Past Medical History  Diagnosis Date  . Diabetes mellitus     History reviewed. No pertinent past surgical history.    Medication List       This list is accurate as of: 09/17/14 10:36 AM.  Always use your most recent med list.               cephALEXin 500 MG capsule  Commonly known as:  KEFLEX  Take 1 capsule (500 mg total) by mouth 4 (four) times daily.     clotrimazole 1 % cream  Commonly known as:  LOTRIMIN  Apply to affected area 2 times daily     gabapentin 300 MG capsule  Commonly known as:  NEURONTIN  Take 300 mg by mouth 3 (three) times daily.     insulin glargine 100 UNIT/ML injection  Commonly known as:  LANTUS  Inject 25 Units into the skin daily.     insulin lispro 100 UNIT/ML injection  Commonly known as:  HUMALOG  Inject 8 Units into the skin 4 (four) times daily.     lisinopril 5 MG tablet  Commonly known as:  PRINIVIL,ZESTRIL  Take 1 tablet (5 mg total) by mouth daily.     sulfamethoxazole-trimethoprim 800-160 MG per tablet  Commonly known as:  BACTRIM DS  Take 1 tablet by mouth 2 (two) times daily.         Meds ordered this encounter  Medications  . gabapentin (NEURONTIN) 300 MG capsule    Sig: Take 300 mg by mouth 3 (three) times daily.  . cephALEXin (KEFLEX) 500 MG capsule    Sig: Take 1 capsule (500 mg total) by mouth 4 (four) times daily.    Dispense:  28 capsule    Refill:  0     There is no immunization history on file for this patient.  Family History  Problem Relation Age of Onset  . Diabetes Mother   . Diabetes Father   . Diabetes Sister     History  Substance Use Topics  . Smoking status: Never Smoker   . Smokeless tobacco: Not on file  . Alcohol Use: No    Review of Systems   As noted in HPI  Filed Vitals:   09/17/14 1000  BP: 115/80  Pulse: 84  Temp: 98.4 F (36.9 C)  Resp: 16    Physical Exam  Physical Exam  Constitutional: No distress.  Eyes: EOM are normal. Pupils are equal, round, and reactive to light.  Cardiovascular: Normal rate and regular rhythm.   Pulmonary/Chest: Breath sounds normal. No respiratory distress. He has no wheezes. He has no rales.  Musculoskeletal:  Incision site minimal discharge with surrounding erythema no tenderness     CBC    Component Value Date/Time   WBC 5.1 03/10/2014 1651   RBC 5.51 03/10/2014 1651   HGB 13.9 03/10/2014 1651   HCT 41.9 03/10/2014 1651   PLT 250 03/10/2014 1651   MCV 76.0* 03/10/2014 1651   LYMPHSABS 1.3 08/21/2013 1204   MONOABS 0.2 08/21/2013 1204   EOSABS 0.0 08/21/2013 1204   BASOSABS 0.0 08/21/2013 1204    CMP     Component Value Date/Time   NA 135 08/26/2014 1756   K 3.8 08/26/2014 1756   CL 100 08/26/2014 1756   CO2 27 08/26/2014 1756   GLUCOSE 261* 08/26/2014 1756   BUN 7 08/26/2014 1756   CREATININE 0.70 08/26/2014 1756   CREATININE 0.67 03/10/2014 1651   CALCIUM 9.1 08/26/2014 1756   PROT 7.5 08/26/2014 1756   ALBUMIN 3.9 08/26/2014 1756   AST 11 08/26/2014 1756   ALT <8 08/26/2014 1756   ALKPHOS 69 08/26/2014 1756   BILITOT 0.6 08/26/2014 1756   GFRNONAA >89 08/26/2014 1756    GFRNONAA >90 03/10/2014 1651   GFRAA >89 08/26/2014 1756   GFRAA >90 03/10/2014 1651    Lab Results  Component Value Date/Time   CHOL 191 08/21/2013 12:04 PM    No components found with this basename: hga1c    Lab Results  Component Value Date/Time   AST 11 08/26/2014  5:56 PM    Assessment and Plan  Diabetes mellitus due to underlying condition without complications - Plan: Glucose (CBG), patient will continue with current dose of Lantus 25 units as well as Humalog 8 units 4 times a day will check his A1c in 3 months.  Essential hypertension Pressure is well-controlled continued current meds.  Back abscess/ Boils - Plan: Patient has already been treated with antibiotic, currently has discharge I have started him on cephALEXin (KEFLEX) 500 MG capsule to take for 7 days, he'll come back in 2 weeks for nurse visit wound check also advise patient to get immediate medical attention if he has any worsening of the symptoms are developed a fever chills., Patient understand and verbalized restriction.   Need for prophylactic vaccination against Streptococcus pneumoniae (pneumococcus)  flu shot and Pneumovax given today.    Return in about 3 months (around 12/18/2014) for diabetes, hypertension, wound check in 2 weeks/Nurse Visit.  Lorayne Marek, MD

## 2014-09-17 NOTE — Progress Notes (Signed)
Patient here for follow from hospital Was seen at [facility] for infection on his back Patient has not taken his medications today

## 2014-10-01 ENCOUNTER — Ambulatory Visit: Payer: Self-pay

## 2014-11-12 ENCOUNTER — Ambulatory Visit: Payer: Self-pay | Attending: Internal Medicine | Admitting: Internal Medicine

## 2014-11-12 ENCOUNTER — Encounter: Payer: Self-pay | Admitting: Internal Medicine

## 2014-11-12 VITALS — BP 136/92 | HR 108 | Temp 98.0°F | Resp 16 | Wt 179.6 lb

## 2014-11-12 DIAGNOSIS — M79606 Pain in leg, unspecified: Secondary | ICD-10-CM | POA: Insufficient documentation

## 2014-11-12 DIAGNOSIS — L299 Pruritus, unspecified: Secondary | ICD-10-CM | POA: Insufficient documentation

## 2014-11-12 DIAGNOSIS — E139 Other specified diabetes mellitus without complications: Secondary | ICD-10-CM

## 2014-11-12 DIAGNOSIS — Z794 Long term (current) use of insulin: Secondary | ICD-10-CM | POA: Insufficient documentation

## 2014-11-12 DIAGNOSIS — E1140 Type 2 diabetes mellitus with diabetic neuropathy, unspecified: Secondary | ICD-10-CM | POA: Insufficient documentation

## 2014-11-12 DIAGNOSIS — Z792 Long term (current) use of antibiotics: Secondary | ICD-10-CM | POA: Insufficient documentation

## 2014-11-12 LAB — GLUCOSE, POCT (MANUAL RESULT ENTRY): POC Glucose: 352 mg/dl — AB (ref 70–99)

## 2014-11-12 LAB — POCT GLYCOSYLATED HEMOGLOBIN (HGB A1C): Hemoglobin A1C: 12.9

## 2014-11-12 MED ORDER — HYDROCORTISONE 1 % EX CREA: 1.0000 "application " | TOPICAL_CREAM | Freq: Two times a day (BID) | CUTANEOUS | Status: DC

## 2014-11-12 MED ORDER — GABAPENTIN 400 MG PO CAPS: 400.0000 mg | ORAL_CAPSULE | Freq: Three times a day (TID) | ORAL | Status: DC

## 2014-11-12 NOTE — Progress Notes (Signed)
MRN: [id number] Name: Jeffrey [name]  Sex: male Age: 48 y.o. DOB: [birth year]  Allergies: Review of patient's allergies indicates no known allergies.  Chief Complaint  Patient presents with  . Leg Pain    HPI: Patient is 48 y.o. male who history of diabetes hypertension comes today for followup complaining of leg pain history of diabetes neuropathy as per patient is taking Neurontin 300 mg 3 times a day, his diabetes is not well controlled his hemoglobin A1c has trended up as per patient he is taking Lantus 25 units each bedtime and Humalog 8 units 3 times with meals, patient denies any hypoglycemic symptoms , he also reports itching on his back denies any fever chills , denies any change in soap detergent, new medications or food.  Past Medical History  Diagnosis Date  . Diabetes mellitus     History reviewed. No pertinent past surgical history.    Medication List       This list is accurate as of: 11/12/14  5:33 PM.  Always use your most recent med list.               cephALEXin 500 MG capsule  Commonly known as:  KEFLEX  Take 1 capsule (500 mg total) by mouth 4 (four) times daily.     clotrimazole 1 % cream  Commonly known as:  LOTRIMIN  Apply to affected area 2 times daily     gabapentin 400 MG capsule  Commonly known as:  NEURONTIN  Take 1 capsule (400 mg total) by mouth 3 (three) times daily.     hydrocortisone cream 1 %  Apply 1 application topically 2 (two) times daily.     insulin glargine 100 UNIT/ML injection  Commonly known as:  LANTUS  Inject 25 Units into the skin daily.     insulin lispro 100 UNIT/ML injection  Commonly known as:  HUMALOG  Inject 8 Units into the skin 4 (four) times daily.     lisinopril 5 MG tablet  Commonly known as:  PRINIVIL,ZESTRIL  Take 1 tablet (5 mg total) by mouth daily.     sulfamethoxazole-trimethoprim 800-160 MG per tablet  Commonly known as:  BACTRIM DS  Take 1 tablet by mouth 2 (two) times daily.         Meds ordered this encounter  Medications  . gabapentin (NEURONTIN) 400 MG capsule    Sig: Take 1 capsule (400 mg total) by mouth 3 (three) times daily.    Dispense:  90 capsule    Refill:  3  . hydrocortisone cream 1 %    Sig: Apply 1 application topically 2 (two) times daily.    Dispense:  30 g    Refill:  1    Immunization History  Administered Date(s) Administered  . Influenza,inj,Quad PF,36+ Mos 09/17/2014  . Pneumococcal Polysaccharide-23 09/17/2014    Family History  Problem Relation Age of Onset  . Diabetes Mother   . Diabetes Father   . Diabetes Sister     History  Substance Use Topics  . Smoking status: Never Smoker   . Smokeless tobacco: Not on file  . Alcohol Use: No    Review of Systems   As noted in HPI  Filed Vitals:   11/12/14 1650  BP: 136/92  Pulse: 108  Temp: 98 F (36.7 C)  Resp: 16    Physical Exam  Physical Exam  Constitutional: No distress.  Eyes: EOM are normal. Pupils are equal, round, and reactive to light.  Cardiovascular: Normal rate.   Pulmonary/Chest: Breath sounds normal. No respiratory distress. He has no wheezes. He has no rales.  Skin: Skin is dry.    CBC    Component Value Date/Time   WBC 5.1 03/10/2014 1651   RBC 5.51 03/10/2014 1651   HGB 13.9 03/10/2014 1651   HCT 41.9 03/10/2014 1651   PLT 250 03/10/2014 1651   MCV 76.0* 03/10/2014 1651   LYMPHSABS 1.3 08/21/2013 1204   MONOABS 0.2 08/21/2013 1204   EOSABS 0.0 08/21/2013 1204   BASOSABS 0.0 08/21/2013 1204    CMP     Component Value Date/Time   NA 135 08/26/2014 1756   K 3.8 08/26/2014 1756   CL 100 08/26/2014 1756   CO2 27 08/26/2014 1756   GLUCOSE 261* 08/26/2014 1756   BUN 7 08/26/2014 1756   CREATININE 0.70 08/26/2014 1756   CREATININE 0.67 03/10/2014 1651   CALCIUM 9.1 08/26/2014 1756   PROT 7.5 08/26/2014 1756   ALBUMIN 3.9 08/26/2014 1756   AST 11 08/26/2014 1756   ALT <8 08/26/2014 1756   ALKPHOS 69 08/26/2014 1756   BILITOT 0.6  08/26/2014 1756   GFRNONAA >89 08/26/2014 1756   GFRNONAA >90 03/10/2014 1651   GFRAA >89 08/26/2014 1756   GFRAA >90 03/10/2014 1651    Lab Results  Component Value Date/Time   CHOL 191 08/21/2013 12:04 PM    No components found for: HGA1C  Lab Results  Component Value Date/Time   AST 11 08/26/2014 05:56 PM    Assessment and Plan  Other specified diabetes mellitus without complications - Plan:  Results for orders placed or performed in visit on 11/12/14  HgB A1c  Result Value Ref Range   Hemoglobin A1C 12.9   Glucose (CBG)  Result Value Ref Range   POC Glucose 352 (A) 70 - 99 mg/dl   Diabetes is uncontrolled,to have advised patient for diabetes meal planning, keep the fingerstick log, he will increase the dose of Lantus to 30 units each bedtime and Humalog to 9 units with each meal, also Ambulatory referral to Endocrinology, will repeat his A1c in 3 months.  Itching - Plan: hydrocortisone cream 1 %  Pain of lower extremity, unspecified laterality - Plan: has history of diabetic neuropathy likely the pain is worsening secondary to uncontrolled diabetes, have increased the dose of Neurontin.gabapentin (NEURONTIN) 400 MG capsule   Health Maintenance : -Vaccinations:  uptodate with flu shot and pneumovax   Return in about 3 months (around 02/11/2015) for diabetes, hypertension.  Lorayne Marek, MD

## 2014-11-12 NOTE — Patient Instructions (Signed)
DASH Eating Plan DASH stands for "Dietary Approaches to Stop Hypertension." The DASH eating plan is a healthy eating plan that has been shown to reduce high blood pressure (hypertension). Additional health benefits may include reducing the risk of type 2 diabetes mellitus, heart disease, and stroke. The DASH eating plan may also help with weight loss. WHAT DO I NEED TO KNOW ABOUT THE DASH EATING PLAN? For the DASH eating plan, you will follow these general guidelines:  Choose foods with a percent daily value for sodium of less than 5% (as listed on the food label).  Use salt-free seasonings or herbs instead of table salt or sea salt.  Check with your health care provider or pharmacist before using salt substitutes.  Eat lower-sodium products, often labeled as "lower sodium" or "no salt added."  Eat fresh foods.  Eat more vegetables, fruits, and low-fat dairy products.  Choose whole grains. Look for the word "whole" as the first word in the ingredient list.  Choose fish and skinless chicken or turkey more often than red meat. Limit fish, poultry, and meat to 6 oz (170 g) each day.  Limit sweets, desserts, sugars, and sugary drinks.  Choose heart-healthy fats.  Limit cheese to 1 oz (28 g) per day.  Eat more home-cooked food and less restaurant, buffet, and fast food.  Limit fried foods.  Cook foods using methods other than frying.  Limit canned vegetables. If you do use them, rinse them well to decrease the sodium.  When eating at a restaurant, ask that your food be prepared with less salt, or no salt if possible. WHAT FOODS CAN I EAT? Seek help from a dietitian for individual calorie needs. Grains Whole grain or whole wheat bread. Brown rice. Whole grain or whole wheat pasta. Quinoa, bulgur, and whole grain cereals. Low-sodium cereals. Corn or whole wheat flour tortillas. Whole grain cornbread. Whole grain crackers. Low-sodium crackers. Vegetables Fresh or frozen vegetables  (raw, steamed, roasted, or grilled). Low-sodium or reduced-sodium tomato and vegetable juices. Low-sodium or reduced-sodium tomato sauce and paste. Low-sodium or reduced-sodium canned vegetables.  Fruits All fresh, canned (in natural juice), or frozen fruits. Meat and Other Protein Products Ground beef (85% or leaner), grass-fed beef, or beef trimmed of fat. Skinless chicken or turkey. Ground chicken or turkey. Pork trimmed of fat. All fish and seafood. Eggs. Dried beans, peas, or lentils. Unsalted nuts and seeds. Unsalted canned beans. Dairy Low-fat dairy products, such as skim or 1% milk, 2% or reduced-fat cheeses, low-fat ricotta or cottage cheese, or plain low-fat yogurt. Low-sodium or reduced-sodium cheeses. Fats and Oils Tub margarines without trans fats. Light or reduced-fat mayonnaise and salad dressings (reduced sodium). Avocado. Safflower, olive, or canola oils. Natural peanut or almond butter. Other Unsalted popcorn and pretzels. The items listed above may not be a complete list of recommended foods or beverages. Contact your dietitian for more options. WHAT FOODS ARE NOT RECOMMENDED? Grains White bread. White pasta. White rice. Refined cornbread. Bagels and croissants. Crackers that contain trans fat. Vegetables Creamed or fried vegetables. Vegetables in a cheese sauce. Regular canned vegetables. Regular canned tomato sauce and paste. Regular tomato and vegetable juices. Fruits Dried fruits. Canned fruit in light or heavy syrup. Fruit juice. Meat and Other Protein Products Fatty cuts of meat. Ribs, chicken wings, bacon, sausage, bologna, salami, chitterlings, fatback, hot dogs, bratwurst, and packaged luncheon meats. Salted nuts and seeds. Canned beans with salt. Dairy Whole or 2% milk, cream, half-and-half, and cream cheese. Whole-fat or sweetened yogurt. Full-fat   cheeses or blue cheese. Nondairy creamers and whipped toppings. Processed cheese, cheese spreads, or cheese  curds. Condiments Onion and garlic salt, seasoned salt, table salt, and sea salt. Canned and packaged gravies. Worcestershire sauce. Tartar sauce. Barbecue sauce. Teriyaki sauce. Soy sauce, including reduced sodium. Steak sauce. Fish sauce. Oyster sauce. Cocktail sauce. Horseradish. Ketchup and mustard. Meat flavorings and tenderizers. Bouillon cubes. Hot sauce. Tabasco sauce. Marinades. Taco seasonings. Relishes. Fats and Oils Butter, stick margarine, lard, shortening, ghee, and bacon fat. Coconut, palm kernel, or palm oils. Regular salad dressings. Other Pickles and olives. Salted popcorn and pretzels. The items listed above may not be a complete list of foods and beverages to avoid. Contact your dietitian for more information. WHERE CAN I FIND MORE INFORMATION? National Heart, Lung, and [employer] Institute: www.nhlbi.nih.gov/health/health-topics/topics/dash/ Document Released: 11/17/2011 Document Revised: 04/14/2014 Document Reviewed: 10/02/2013 ExitCare Patient Information 2015 [employer]. This information is not intended to replace advice given to you by your health care provider. Make sure you discuss any questions you have with your health care provider. Diabetes Mellitus and Food It is important for you to manage your blood sugar (glucose) level. Your blood glucose level can be greatly affected by what you eat. Eating healthier foods in the appropriate amounts throughout the day at about the same time each day will help you control your blood glucose level. It can also help slow or prevent worsening of your diabetes mellitus. Healthy eating may even help you improve the level of your blood pressure and reach or maintain a healthy weight.  HOW CAN FOOD AFFECT ME? Carbohydrates Carbohydrates affect your blood glucose level more than any other type of food. Your dietitian will help you determine how many carbohydrates to eat at each meal and teach you how to count carbohydrates. Counting  carbohydrates is important to keep your blood glucose at a healthy level, especially if you are using insulin or taking certain medicines for diabetes mellitus. Alcohol Alcohol can cause sudden decreases in blood glucose (hypoglycemia), especially if you use insulin or take certain medicines for diabetes mellitus. Hypoglycemia can be a life-threatening condition. Symptoms of hypoglycemia (sleepiness, dizziness, and disorientation) are similar to symptoms of having too much alcohol.  If your health care provider has given you approval to drink alcohol, do so in moderation and use the following guidelines:  Women should not have more than one drink per day, and men should not have more than two drinks per day. One drink is equal to:  12 oz of beer.  5 oz of wine.  1 oz of hard liquor.  Do not drink on an empty stomach.  Keep yourself hydrated. Have water, diet soda, or unsweetened iced tea.  Regular soda, juice, and other mixers might contain a lot of carbohydrates and should be counted. WHAT FOODS ARE NOT RECOMMENDED? As you make food choices, it is important to remember that all foods are not the same. Some foods have fewer nutrients per serving than other foods, even though they might have the same number of calories or carbohydrates. It is difficult to get your body what it needs when you eat foods with fewer nutrients. Examples of foods that you should avoid that are high in calories and carbohydrates but low in nutrients include:  Trans fats (most processed foods list trans fats on the Nutrition Facts label).  Regular soda.  Juice.  Candy.  Sweets, such as cake, pie, doughnuts, and cookies.  Fried foods. WHAT FOODS CAN I EAT? Have nutrient-rich foods,   which will nourish your body and keep you healthy. The food you should eat also will depend on several factors, including:  The calories you need.  The medicines you take.  Your weight.  Your blood glucose level.  Your  blood pressure level.  Your cholesterol level. You also should eat a variety of foods, including:  Protein, such as meat, poultry, fish, tofu, nuts, and seeds (lean animal proteins are best).  Fruits.  Vegetables.  Dairy products, such as milk, cheese, and yogurt (low fat is best).  Breads, grains, pasta, cereal, rice, and beans.  Fats such as olive oil, trans fat-free margarine, canola oil, avocado, and olives. DOES EVERYONE WITH DIABETES MELLITUS HAVE THE SAME MEAL PLAN? Because every person with diabetes mellitus is different, there is not one meal plan that works for everyone. It is very important that you meet with a dietitian who will help you create a meal plan that is just right for you. Document Released: 08/25/2005 Document Revised: 12/03/2013 Document Reviewed: 10/25/2013 ExitCare Patient Information 2015 [employer]. This information is not intended to replace advice given to you by your health care provider. Make sure you discuss any questions you have with your health care provider.  

## 2014-11-12 NOTE — Progress Notes (Signed)
Patient here for follow up on his Diabetes Complains of nerve pain to his legs and his back is very itchy

## 2014-12-05 ENCOUNTER — Emergency Department (HOSPITAL_COMMUNITY): Payer: Self-pay

## 2014-12-05 ENCOUNTER — Emergency Department (HOSPITAL_COMMUNITY)
Admission: EM | Admit: 2014-12-05 | Discharge: 2014-12-05 | Disposition: A | Discharge status: Home / Self Care | Payer: Self-pay | Attending: Emergency Medicine | Admitting: Emergency Medicine

## 2014-12-05 ENCOUNTER — Encounter (HOSPITAL_COMMUNITY): Payer: Self-pay | Admitting: *Deleted

## 2014-12-05 DIAGNOSIS — Z794 Long term (current) use of insulin: Secondary | ICD-10-CM | POA: Insufficient documentation

## 2014-12-05 DIAGNOSIS — R0789 Other chest pain: Secondary | ICD-10-CM | POA: Insufficient documentation

## 2014-12-05 DIAGNOSIS — R350 Frequency of micturition: Secondary | ICD-10-CM | POA: Insufficient documentation

## 2014-12-05 DIAGNOSIS — E119 Type 2 diabetes mellitus without complications: Secondary | ICD-10-CM | POA: Insufficient documentation

## 2014-12-05 DIAGNOSIS — Z79899 Other long term (current) drug therapy: Secondary | ICD-10-CM | POA: Insufficient documentation

## 2014-12-05 DIAGNOSIS — Z792 Long term (current) use of antibiotics: Secondary | ICD-10-CM | POA: Insufficient documentation

## 2014-12-05 DIAGNOSIS — R631 Polydipsia: Secondary | ICD-10-CM | POA: Insufficient documentation

## 2014-12-05 DIAGNOSIS — R42 Dizziness and giddiness: Secondary | ICD-10-CM | POA: Insufficient documentation

## 2014-12-05 LAB — CBC
HCT: 41.5 % (ref 39.0–52.0)
Hemoglobin: 13 g/dL (ref 13.0–17.0)
MCH: 23.3 pg — ABNORMAL LOW (ref 26.0–34.0)
MCHC: 31.3 g/dL (ref 30.0–36.0)
MCV: 74.4 fL — ABNORMAL LOW (ref 78.0–100.0)
Platelets: 322 K/uL (ref 150–400)
RBC: 5.58 MIL/uL (ref 4.22–5.81)
RDW: 15.1 % (ref 11.5–15.5)
WBC: 5.8 K/uL (ref 4.0–10.5)

## 2014-12-05 LAB — COMPREHENSIVE METABOLIC PANEL
ALT: 15 U/L (ref 0–53)
ANION GAP: 10 (ref 5–15)
AST: 20 U/L (ref 0–37)
Albumin: 3.7 g/dL (ref 3.5–5.2)
Alkaline Phosphatase: 77 U/L (ref 39–117)
BUN: 10 mg/dL (ref 6–23)
CALCIUM: 9.8 mg/dL (ref 8.4–10.5)
CO2: 23 mmol/L (ref 19–32)
CREATININE: 0.75 mg/dL (ref 0.50–1.35)
Chloride: 107 mEq/L (ref 96–112)
GFR calc non Af Amer: >90 mL/min (ref >90)
Glucose, Bld: 155 mg/dL — ABNORMAL HIGH (ref 70–99)
Potassium: 3.6 mmol/L (ref 3.5–5.1)
Sodium: 140 mmol/L (ref 135–145)
Total Bilirubin: 0.3 mg/dL (ref 0.3–1.2)
Total Protein: 7.7 g/dL (ref 6.0–8.3)

## 2014-12-05 LAB — BRAIN NATRIURETIC PEPTIDE: B NATRIURETIC PEPTIDE 5: 10 pg/mL (ref 0.0–100.0)

## 2014-12-05 LAB — D-DIMER, QUANTITATIVE: D-Dimer, Quant: <0.27 ug{FEU}/mL (ref 0.00–0.48)

## 2014-12-05 LAB — I-STAT TROPONIN, ED: Troponin i, poc: 0.01 ng/mL (ref 0.00–0.08)

## 2014-12-05 LAB — CBG MONITORING, ED: Glucose-Capillary: 142 mg/dL — ABNORMAL HIGH (ref 70–99)

## 2014-12-05 MED ORDER — IBUPROFEN 600 MG PO TABS: 600.0000 mg | ORAL_TABLET | Freq: Four times a day (QID) | ORAL | Status: DC | PRN

## 2014-12-05 MED ORDER — SODIUM CHLORIDE 0.9 % IV BOLUS (SEPSIS)
1000.0000 mL | Freq: Once | INTRAVENOUS | Status: AC
  Administered 2014-12-05: 1000 mL via INTRAVENOUS

## 2014-12-05 MED ORDER — METHOCARBAMOL 500 MG PO TABS: 1000.0000 mg | ORAL_TABLET | Freq: Three times a day (TID) | ORAL | Status: DC | PRN

## 2014-12-05 NOTE — ED Notes (Signed)
Dr Yelverton at bedside.  

## 2014-12-05 NOTE — ED Provider Notes (Signed)
CSN: [id number]     Arrival date & time 12/05/14  0005 History  This chart was scribe for Julianne Rice, MD by Judithann Sauger, ED Scribe. The patient was seen in room A05C/A05C and the patient's care was started at 12:30 AM.     Chief Complaint  Patient presents with  . Chest Pain  . Blood Sugar Problem   Patient is a 48 y.o. male presenting with chest pain. The history is provided by the patient. No language interpreter was used.  Chest Pain Pain location:  R chest Duration:  3 days Timing:  Intermittent Progression:  Worsening Chronicity:  New Associated symptoms: dizziness   Associated symptoms: no abdominal pain, no back pain, no cough, no fever, no headache, no nausea, no numbness, no palpitations, no shortness of breath, not vomiting and no weakness   Risk factors: diabetes mellitus    HPI Comments: Jeffrey [name] is a 48 y.o. male who presents to the Emergency Department complaining of intermittent right side CP as well as an elevated blood sugar onset about 3 days ago. He reports associated increased urination and lightheadedness. He reports that the chest pain is worse with palpation and deep breathing.Marland Kitchen He denies any leg swelling or pain. He reports that he has been compliant with his medication for about one month. He also reports back surgery this year but is unsure exactly when. He denies any previous similar symptoms. No fever or chills. No shortness of breath. Denies cough. No recent extended travel.  Past Medical History  Diagnosis Date  . Diabetes mellitus    Past Surgical History  Procedure Laterality Date  . Back surgery     Family History  Problem Relation Age of Onset  . Diabetes Mother   . Diabetes Father   . Diabetes Sister    History  Substance Use Topics  . Smoking status: Never Smoker   . Smokeless tobacco: Not on file  . Alcohol Use: No    Review of Systems  Constitutional: Negative for fever and chills.  Respiratory: Negative for cough,  shortness of breath and wheezing.   Cardiovascular: Positive for chest pain. Negative for palpitations and leg swelling.  Gastrointestinal: Negative for nausea, vomiting, abdominal pain and diarrhea.  Endocrine: Positive for polydipsia and polyuria.  Genitourinary: Positive for frequency. Negative for dysuria and hematuria.  Musculoskeletal: Negative for back pain, neck pain and neck stiffness.  Skin: Negative for rash and wound.  Neurological: Positive for dizziness and light-headedness. Negative for syncope, weakness, numbness and headaches.  All other systems reviewed and are negative.     Allergies  Review of patient's allergies indicates no known allergies.  Home Medications   Prior to Admission medications   Medication Sig Start Date End Date Taking? Authorizing Provider  cephALEXin (KEFLEX) 500 MG capsule Take 1 capsule (500 mg total) by mouth 4 (four) times daily. 09/17/14   Lorayne Marek, MD  clotrimazole (LOTRIMIN) 1 % cream Apply to affected area 2 times daily 03/11/14   Artis Delay, MD  gabapentin (NEURONTIN) 400 MG capsule Take 1 capsule (400 mg total) by mouth 3 (three) times daily. 11/12/14   Lorayne Marek, MD  hydrocortisone cream 1 % Apply 1 application topically 2 (two) times daily. 11/12/14   Lorayne Marek, MD  insulin glargine (LANTUS) 100 UNIT/ML injection Inject 25 Units into the skin daily.    Historical Provider, MD  insulin lispro (HUMALOG) 100 UNIT/ML injection Inject 8 Units into the skin 4 (four) times daily.  Historical Provider, MD  lisinopril (PRINIVIL,ZESTRIL) 5 MG tablet Take 1 tablet (5 mg total) by mouth daily. 08/26/14   Lorayne Marek, MD  sulfamethoxazole-trimethoprim (BACTRIM DS) 800-160 MG per tablet Take 1 tablet by mouth 2 (two) times daily. 04/21/14   Deepak Advani, MD   BP 131/86 mmHg  Pulse 106  Temp(Src) 99.1 F (37.3 C) (Oral)  Resp 24  Ht 5\' 10"  (1.778 m)  Wt 184 lb (83.462 kg)  BMI 26.40 kg/m2  SpO2 100% Physical Exam   Constitutional: He is oriented to person, place, and time. He appears well-developed and well-nourished. No distress.  HENT:  Head: Normocephalic and atraumatic.  Mouth/Throat: Oropharynx is clear and moist.  Eyes: EOM are normal. Pupils are equal, round, and reactive to light.  Neck: Normal range of motion. Neck supple.  Cardiovascular: Normal rate and regular rhythm.   Pulmonary/Chest: Effort normal and breath sounds normal. No respiratory distress. He has no wheezes. He has no rales. He exhibits tenderness (tenderness to chest on the right sternal border. There is no crepitance or deformity.).  Abdominal: Soft. Bowel sounds are normal. He exhibits no distension and no mass. There is no tenderness. There is no rebound and no guarding.  Musculoskeletal: Normal range of motion. He exhibits no edema or tenderness.  No calf swelling or tenderness.  Neurological: He is alert and oriented to person, place, and time.  Skin: Skin is warm and dry. No rash noted. No erythema.  Psychiatric: He has a normal mood and affect. His behavior is normal.  Nursing note and vitals reviewed.   ED Course  Procedures (including critical care time) DIAGNOSTIC STUDIES: Oxygen Saturation is 98% on RA, normal by my interpretation.    COORDINATION OF CARE: 12:34 AM- Pt advised of plan for treatment and pt agrees.    Labs Review Labs Reviewed  CBC  BRAIN NATRIURETIC PEPTIDE  COMPREHENSIVE METABOLIC PANEL  I-STAT [place], ED  CBG MONITORING, ED    Imaging Review No results found.   EKG Interpretation None      MDM   Final diagnoses:  None      I personally performed the services described in this documentation, which was scribed in my presence. The recorded information has been reviewed and is accurate.    Chest pain is completely reproduced with palpation of the right chest. Atypical presentation for coronary artery disease. Given initial tachycardia d-dimer was sent. D-dimer was  negative. [name] symptoms have improved. Return precautions given. Patient buys follow-up with primary doctor.  Julianne Rice, MD 12/06/14 [phone]

## 2014-12-05 NOTE — ED Notes (Signed)
Patient reports he had onset of chest pain 2 days that has been intermittent.  Patient states he had worse pain today since this morning.  Patient has taken his daily meds.  Patient reports his sugar is also elevated.  Reported to be over 300 today.  Patient states he was not taking his meds but has been x 1 month.  Patient states he feels sob and dizziness,  Patient reports he has had weight loss and more urination.

## 2014-12-05 NOTE — Discharge Instructions (Signed)

## 2014-12-18 ENCOUNTER — Emergency Department (HOSPITAL_COMMUNITY): Payer: Self-pay

## 2014-12-18 ENCOUNTER — Emergency Department (HOSPITAL_COMMUNITY)
Admission: EM | Admit: 2014-12-18 | Discharge: 2014-12-19 | Disposition: A | Discharge status: Home / Self Care | Payer: Self-pay | Attending: Emergency Medicine | Admitting: Emergency Medicine

## 2014-12-18 ENCOUNTER — Encounter (HOSPITAL_COMMUNITY): Payer: Self-pay | Admitting: Cardiology

## 2014-12-18 DIAGNOSIS — R0789 Other chest pain: Secondary | ICD-10-CM

## 2014-12-18 DIAGNOSIS — Z79899 Other long term (current) drug therapy: Secondary | ICD-10-CM | POA: Insufficient documentation

## 2014-12-18 DIAGNOSIS — R0602 Shortness of breath: Secondary | ICD-10-CM | POA: Insufficient documentation

## 2014-12-18 DIAGNOSIS — E119 Type 2 diabetes mellitus without complications: Secondary | ICD-10-CM | POA: Insufficient documentation

## 2014-12-18 DIAGNOSIS — R071 Chest pain on breathing: Secondary | ICD-10-CM | POA: Insufficient documentation

## 2014-12-18 DIAGNOSIS — N5089 Other specified disorders of the male genital organs: Secondary | ICD-10-CM

## 2014-12-18 DIAGNOSIS — Z7952 Long term (current) use of systemic steroids: Secondary | ICD-10-CM | POA: Insufficient documentation

## 2014-12-18 DIAGNOSIS — L02215 Cutaneous abscess of perineum: Secondary | ICD-10-CM | POA: Insufficient documentation

## 2014-12-18 DIAGNOSIS — M549 Dorsalgia, unspecified: Secondary | ICD-10-CM | POA: Insufficient documentation

## 2014-12-18 DIAGNOSIS — R079 Chest pain, unspecified: Secondary | ICD-10-CM

## 2014-12-18 DIAGNOSIS — Z794 Long term (current) use of insulin: Secondary | ICD-10-CM | POA: Insufficient documentation

## 2014-12-18 LAB — I-STAT TROPONIN, ED: Troponin i, poc: 0 ng/mL (ref 0.00–0.08)

## 2014-12-18 LAB — BASIC METABOLIC PANEL
ANION GAP: 8 (ref 5–15)
BUN: 7 mg/dL (ref 6–23)
CALCIUM: 9.2 mg/dL (ref 8.4–10.5)
CO2: 26 mmol/L (ref 19–32)
Chloride: 101 mEq/L (ref 96–112)
Creatinine, Ser: 0.72 mg/dL (ref 0.50–1.35)
GLUCOSE: 284 mg/dL — AB (ref 70–99)
Potassium: 3.7 mmol/L (ref 3.5–5.1)
Sodium: 135 mmol/L (ref 135–145)

## 2014-12-18 LAB — CBC
HCT: 38.1 % — ABNORMAL LOW (ref 39.0–52.0)
Hemoglobin: 12.3 g/dL — ABNORMAL LOW (ref 13.0–17.0)
MCH: 23.7 pg — ABNORMAL LOW (ref 26.0–34.0)
MCHC: 32.3 g/dL (ref 30.0–36.0)
MCV: 73.3 fL — ABNORMAL LOW (ref 78.0–100.0)
Platelets: 336 10*3/uL (ref 150–400)
RBC: 5.2 MIL/uL (ref 4.22–5.81)
RDW: 15.5 % (ref 11.5–15.5)
WBC: 8.1 10*3/uL (ref 4.0–10.5)

## 2014-12-18 MED ORDER — SULFAMETHOXAZOLE-TRIMETHOPRIM 800-160 MG PO TABS: 1.0000 | ORAL_TABLET | Freq: Two times a day (BID) | ORAL | Status: DC

## 2014-12-18 MED ORDER — SULFAMETHOXAZOLE-TRIMETHOPRIM 800-160 MG PO TABS
1.0000 | ORAL_TABLET | Freq: Once | ORAL | Status: AC
  Administered 2014-12-18: 1 via ORAL
  Filled 2014-12-18: qty 1

## 2014-12-18 MED ORDER — CEPHALEXIN 500 MG PO CAPS: 500.0000 mg | ORAL_CAPSULE | Freq: Four times a day (QID) | ORAL | Status: DC

## 2014-12-18 MED ORDER — HYDROCODONE-ACETAMINOPHEN 5-325 MG PO TABS: 1.0000 | ORAL_TABLET | ORAL | Status: DC | PRN

## 2014-12-18 MED ORDER — IOHEXOL 300 MG/ML  SOLN
100.0000 mL | Freq: Once | INTRAMUSCULAR | Status: AC | PRN
  Administered 2014-12-18: 100 mL via INTRAVENOUS

## 2014-12-18 MED ORDER — CEPHALEXIN 250 MG PO CAPS
500.0000 mg | ORAL_CAPSULE | Freq: Once | ORAL | Status: AC
  Administered 2014-12-18: 500 mg via ORAL
  Filled 2014-12-18: qty 2

## 2014-12-18 MED ORDER — LIDOCAINE-EPINEPHRINE (PF) 2 %-1:200000 IJ SOLN
10.0000 mL | Freq: Once | INTRAMUSCULAR | Status: AC
  Administered 2014-12-18: 10 mL
  Filled 2014-12-18: qty 20

## 2014-12-18 MED ORDER — HYDROCODONE-ACETAMINOPHEN 5-325 MG PO TABS
1.0000 | ORAL_TABLET | Freq: Once | ORAL | Status: AC
Start: 2014-12-19 — End: 2014-12-18
  Administered 2014-12-18: 1 via ORAL
  Filled 2014-12-18: qty 1

## 2014-12-18 NOTE — ED Notes (Signed)
Patient is undressing and putting on gown. Notified him that his sister, Kyra Searles, called prior to his arrival to room to check on him.

## 2014-12-18 NOTE — ED Provider Notes (Signed)
  Face-to-face evaluation   History: He has a mass in his perineum, present for at least a week.  He denies a similar problem or known cause.  There are no urinary symptoms.  Physical exam: Alert, calm, cooperative.  Penis, and scrotal contents are normal.  There is induration of the left lower scrotum, which extends to a mass in the left perineum.  This mass extends almost to the anus in a oblong pattern.  The mass is firm, not fluctuant.  The mass is nontender to palpation.  There is an area of skin breakdown revealing pink colored granulation tissue in the center of the mass.  This tissue is leaking some clear fluid.  There is no clear evidence for a point of impending spontaneous drainage.  There is no associated inguinal adenopathy.     Medical screening examination/treatment/procedure(s) were conducted as a shared visit with non-physician practitioner(s) and myself.  I personally evaluated the patient during the encounter  Richarda Blade, MD 12/19/14 1343

## 2014-12-18 NOTE — ED Notes (Signed)
Pt reports chest pain, back pain and an abscess to the back of his leg in his groin area. Denies any n/v or SOb.

## 2014-12-18 NOTE — ED Notes (Signed)
I&D tray at bedside.

## 2014-12-18 NOTE — Discharge Instructions (Signed)
Abscess °An abscess is an infected area that contains a collection of pus and debris. It can occur in almost any part of the body. An abscess is also known as a furuncle or boil. °CAUSES  °An abscess occurs when tissue gets infected. This can occur from blockage of oil or sweat glands, infection of hair follicles, or a minor injury to the skin. As the body tries to fight the infection, pus collects in the area and creates pressure under the skin. This pressure causes pain. People with weakened immune systems have difficulty fighting infections and get certain abscesses more often.  °SYMPTOMS °Usually an abscess develops on the skin and becomes a painful mass that is red, warm, and tender. If the abscess forms under the skin, you may feel a moveable soft area under the skin. Some abscesses break open (rupture) on their own, but most will continue to get worse without care. The infection can spread deeper into the body and eventually into the bloodstream, causing you to feel ill.  °DIAGNOSIS  °Your caregiver will take your medical history and perform a physical exam. A sample of fluid may also be taken from the abscess to determine what is causing your infection. °TREATMENT  °Your caregiver may prescribe antibiotic medicines to fight the infection. However, taking antibiotics alone usually does not cure an abscess. Your caregiver may need to make a small cut (incision) in the abscess to drain the pus. In some cases, gauze is packed into the abscess to reduce pain and to continue draining the area. °HOME CARE INSTRUCTIONS  °· Only take over-the-counter or prescription medicines for pain, discomfort, or fever as directed by your caregiver. °· If you were prescribed antibiotics, take them as directed. Finish them even if you start to feel better. °· If gauze is used, follow your caregiver's directions for changing the gauze. °· To avoid spreading the infection: °· Keep your draining abscess covered with a  bandage. °· Wash your hands well. °· Do not share personal care items, towels, or whirlpools with others. °· Avoid skin contact with others. °· Keep your skin and clothes clean around the abscess. °· Keep all follow-up appointments as directed by your caregiver. °SEEK MEDICAL CARE IF:  °· You have increased pain, swelling, redness, fluid drainage, or bleeding. °· You have muscle aches, chills, or a general ill feeling. °· You have a fever. °MAKE SURE YOU:  °· Understand these instructions. °· Will watch your condition. °· Will get help right away if you are not doing well or get worse. °Document Released: 09/07/2005 Document Revised: 05/29/2012 Document Reviewed: 02/10/2012 °[employer]® Patient Information ©2015 [employer]. This information is not intended to replace advice given to you by your health care provider. Make sure you discuss any questions you have with your health care provider. ° °Abscess °Care After °An abscess (also called a boil or furuncle) is an infected area that contains a collection of pus. Signs and symptoms of an abscess include pain, tenderness, redness, or hardness, or you may feel a moveable soft area under your skin. An abscess can occur anywhere in the body. The infection may spread to surrounding tissues causing cellulitis. A cut (incision) by the surgeon was made over your abscess and the pus was drained out. Gauze may have been packed into the space to provide a drain that will allow the cavity to heal from the inside outwards. The boil may be painful for 5 to 7 days. Most people with a boil do not have   high fevers. Your abscess, if seen early, may not have localized, and may not have been lanced. If not, another appointment may be required for this if it does not get better on its own or with medications. HOME CARE INSTRUCTIONS   Only take over-the-counter or prescription medicines for pain, discomfort, or fever as directed by your caregiver.  When you bathe, soak and then  remove gauze or iodoform packs at least daily or as directed by your caregiver. You may then wash the wound gently with mild soapy water. Repack with gauze or do as your caregiver directs. SEEK IMMEDIATE MEDICAL CARE IF:   You develop increased pain, swelling, redness, drainage, or bleeding in the wound site.  You develop signs of generalized infection including muscle aches, chills, fever, or a general ill feeling.  An oral temperature above 102 F (38.9 C) develops, not controlled by medication. See your caregiver for a recheck if you develop any of the symptoms described above. If medications (antibiotics) were prescribed, take them as directed. Document Released: 06/16/2005 Document Revised: 02/20/2012 Document Reviewed: 02/11/2008 [facility] Patient Information 2015 [place], [place]. This information is not intended to replace advice given to you by your health care provider. Make sure you discuss any questions you have with your health care provider. Costochondritis Costochondritis, sometimes called Tietze syndrome, is a swelling and irritation (inflammation) of the tissue (cartilage) that connects your ribs with your breastbone (sternum). It causes pain in the chest and rib area. Costochondritis usually goes away on its own over time. It can take up to 6 weeks or longer to get better, especially if you are unable to limit your activities. CAUSES  Some cases of costochondritis have no known cause. Possible causes include:  Injury (trauma).  Exercise or activity such as lifting.  Severe coughing. SIGNS AND SYMPTOMS  Pain and tenderness in the chest and rib area.  Pain that gets worse when coughing or taking deep breaths.  Pain that gets worse with specific movements. DIAGNOSIS  Your health care provider will do a physical exam and ask about your symptoms. Chest X-rays or other tests may be done to rule out other problems. TREATMENT  Costochondritis usually goes away on its own over  time. Your health care provider may prescribe medicine to help relieve pain. HOME CARE INSTRUCTIONS   Avoid exhausting physical activity. Try not to strain your ribs during normal activity. This would include any activities using chest, abdominal, and side muscles, especially if heavy weights are used.  Apply ice to the affected area for the first 2 days after the pain begins.  Put ice in a plastic bag.  Place a towel between your skin and the bag.  Leave the ice on for 20 minutes, 2-3 times a day.  Only take over-the-counter or prescription medicines as directed by your health care provider. SEEK MEDICAL CARE IF:  You have redness or swelling at the rib joints. These are signs of infection.  Your pain does not go away despite rest or medicine. SEEK IMMEDIATE MEDICAL CARE IF:   Your pain increases or you are very uncomfortable.  You have shortness of breath or difficulty breathing.  You cough up blood.  You have worse chest pains, sweating, or vomiting.  You have a fever or persistent symptoms for more than 2-3 days.  You have a fever and your symptoms suddenly get worse. MAKE SURE YOU:   Understand these instructions.  Will watch your condition.  Will get help right away if you  are not doing well or get worse. Document Released: 09/07/2005 Document Revised: 09/18/2013 Document Reviewed: 07/02/2013 [facility] Patient Information 2015 [place], [place]. This information is not intended to replace advice given to you by your health care provider. Make sure you discuss any questions you have with your health care provider.

## 2014-12-18 NOTE — ED Provider Notes (Signed)
CSN: [id number]     Arrival date & time 12/18/14  1704 History   First MD Initiated Contact with Patient 12/18/14 1853     Chief Complaint  Patient presents with  . Chest Pain  . Back Pain  . Abscess   Jeffrey [name] is a 49 y.o. male with a history of diabetes who presents emergency department complaining of an abscess in his perineum for the past 2 days as well as left-sided nonradiating chest pain that has been intermittent since his last emergency department visit on 12/06/2014. Patient reports he noticed a mass in his perineum possibly about a week ago and has had increased pain over the past 2 days. He denies drainage from his perineum. He has previous abscess. Patient reports his pain is 7 out of 10 and worse with sitting. The patient also complains of nonradiating left-sided chest pain that is worse with palpation. The patient reports he does lots of lifting at work and thinks this has made it worse. Patient reports his pain has been intermittent since his last ER visit on 12/06/2014. The patient also reports associated shortness of breath that has been present since his last ER visit on 12/06/2014. Patient denies fevers, chills, palpitations, abdominal pain, nausea, vomiting, diarrhea, dysuria, hematuria, hematochezia, scrotal swelling, testicular pain, penile discharge, penile pain, or difficulty urinating.  (Consider location/radiation/quality/duration/timing/severity/associated sxs/prior Treatment) HPI  Past Medical History  Diagnosis Date  . Diabetes mellitus    Past Surgical History  Procedure Laterality Date  . Back surgery     Family History  Problem Relation Age of Onset  . Diabetes Mother   . Diabetes Father   . Diabetes Sister    History  Substance Use Topics  . Smoking status: Never Smoker   . Smokeless tobacco: Not on file  . Alcohol Use: No    Review of Systems  Constitutional: Negative for fever and chills.  HENT: Negative for congestion, ear pain, sore  throat and trouble swallowing.   Eyes: Negative for pain and visual disturbance.  Respiratory: Positive for shortness of breath. Negative for cough and wheezing.   Cardiovascular: Positive for chest pain. Negative for palpitations and leg swelling.  Gastrointestinal: Negative for nausea, vomiting, abdominal pain, diarrhea and blood in stool.  Genitourinary: Negative for dysuria, urgency, frequency, hematuria, flank pain, discharge, penile swelling, scrotal swelling, difficulty urinating, genital sores, penile pain and testicular pain.       Pain in his perineum.  Musculoskeletal: Negative for back pain, neck pain and neck stiffness.  Skin: Negative for rash and wound.  Neurological: Negative for headaches.      Allergies  Review of patient's allergies indicates no known allergies.  Home Medications   Prior to Admission medications   Medication Sig Start Date End Date Taking? Authorizing Provider  clotrimazole (LOTRIMIN) 1 % cream Apply to affected area 2 times daily 03/11/14  Yes Houston Siren III, MD  gabapentin (NEURONTIN) 400 MG capsule Take 1 capsule (400 mg total) by mouth 3 (three) times daily. 11/12/14  Yes Lorayne Marek, MD  hydrocortisone cream 1 % Apply 1 application topically 2 (two) times daily. 11/12/14  Yes Lorayne Marek, MD  ibuprofen (ADVIL,MOTRIN) 600 MG tablet Take 1 tablet (600 mg total) by mouth every 6 (six) hours as needed. Patient taking differently: Take 600 mg by mouth every 6 (six) hours as needed for moderate pain.  12/05/14  Yes Julianne Rice, MD  insulin glargine (LANTUS) 100 UNIT/ML injection Inject 25 Units into the skin  daily.   Yes Historical Provider, MD  insulin lispro (HUMALOG) 100 UNIT/ML injection Inject 8 Units into the skin 4 (four) times daily.   Yes Historical Provider, MD  lisinopril (PRINIVIL,ZESTRIL) 5 MG tablet Take 1 tablet (5 mg total) by mouth daily. 08/26/14  Yes Lorayne Marek, MD  methocarbamol (ROBAXIN) 500 MG tablet Take 2 tablets  (1,000 mg total) by mouth every 8 (eight) hours as needed for muscle spasms. 12/05/14  Yes Julianne Rice, MD  cephALEXin (KEFLEX) 500 MG capsule Take 1 capsule (500 mg total) by mouth 4 (four) times daily. 12/18/14   Verda Cumins Tacey Dimaggio, PA-C  HYDROcodone-acetaminophen (NORCO/VICODIN) 5-325 MG per tablet Take 1-2 tablets by mouth every 4 (four) hours as needed for moderate pain or severe pain. 12/18/14   Verda Cumins Jniya Madara, PA-C  sulfamethoxazole-trimethoprim (SEPTRA DS) 800-160 MG per tablet Take 1 tablet by mouth every 12 (twelve) hours. 12/18/14   Verda Cumins Oak Dorey, PA-C   BP 140/66 mmHg  Pulse 90  Temp(Src) 100.4 F (38 C) (Oral)  Resp 12  Wt 184 lb (83.462 kg)  SpO2 100% Physical Exam  Constitutional: He appears well-developed and well-nourished. No distress.  Non-toxic appearing  HENT:  Head: Normocephalic and atraumatic.  Mouth/Throat: Oropharynx is clear and moist.  Eyes: Conjunctivae are normal. Pupils are equal, round, and reactive to light. Right eye exhibits no discharge. Left eye exhibits no discharge.  Neck: Normal range of motion. Neck supple.  Cardiovascular: Normal rate, regular rhythm, normal heart sounds and intact distal pulses.  Exam reveals no gallop and no friction rub.   No murmur heard. Pulmonary/Chest: Effort normal and breath sounds normal. No respiratory distress. He has no wheezes. He has no rales. He exhibits tenderness.  [name] left chest is tender to palpation and reproduces his chest pain.   Abdominal: Soft. Bowel sounds are normal. He exhibits no distension and no mass. There is no tenderness. There is no rebound and no guarding.  Genitourinary: Testes normal and penis normal. Right testis shows no swelling and no tenderness. Left testis shows no swelling and no tenderness. Uncircumcised. No penile erythema or penile tenderness. No discharge found.  Patient has a large mass in his perineum that is associated with his scrotum and left sided,  extending towards his rectum. The mass is firm and there is no fluctuance. It does not feel like an abscess or hernia. There is no drainage from the mass.  Scrotal contents is normal.  Musculoskeletal: He exhibits no edema.  Lymphadenopathy:    He has no cervical adenopathy.  Neurological: He is alert. Coordination normal.  Skin: Skin is warm and dry. No rash noted. He is not diaphoretic. No erythema. No pallor.  Psychiatric: He has a normal mood and affect. His behavior is normal.  Nursing note and vitals reviewed.     ED Course  INCISION AND DRAINAGE Date/Time: 12/18/2014 11:30 PM Performed by: Jeffrey [name] Authorized by: Jeffrey [name] Consent: Verbal consent obtained. Risks and benefits: risks, benefits and alternatives were discussed Consent given by: patient Patient understanding: patient states understanding of the procedure being performed Patient consent: the patient's understanding of the procedure matches consent given Procedure consent: procedure consent matches procedure scheduled Relevant documents: relevant documents present and verified Test results: test results available and properly labeled Site marked: the operative site was marked Imaging studies: imaging studies available Required items: required blood products, implants, devices, and special equipment available Patient identity confirmed: verbally with patient Time out: Immediately prior to procedure a "  time out" was called to verify the correct patient, procedure, equipment, support staff and site/side marked as required. Type: abscess Body area: anogenital Location details: perineum Anesthesia: local infiltration Local anesthetic: lidocaine 2% with epinephrine Anesthetic total: 1 ml Patient sedated: no Scalpel size: 11 Incision type: single straight Complexity: complex Drainage: purulent Drainage amount: copious Wound treatment: wound left open Packing material: 1/4 in iodoform  gauze Patient tolerance: Patient tolerated the procedure well with no immediate complications   (including critical care time) Labs Review Labs Reviewed  BASIC METABOLIC PANEL - Abnormal; Notable for the following:    Glucose, Bld 284 (*)    All other components within normal limits  CBC - Abnormal; Notable for the following:    Hemoglobin 12.3 (*)    HCT 38.1 (*)    MCV 73.3 (*)    MCH 23.7 (*)    All other components within normal limits  I-STAT TROPOININ, ED    Imaging Review Dg Chest 2 View  12/18/2014   CLINICAL DATA:  Generalized and left chest pain for 2 days.  EXAM: CHEST  2 VIEW  COMPARISON:  December 05, 2014  FINDINGS: The heart size and mediastinal contours are stable. The aorta is tortuous. There is no focal infiltrate, pulmonary edema, or pleural effusion. The visualized skeletal structures are unremarkable.  IMPRESSION: No active cardiopulmonary disease.   Electronically Signed   By: Abelardo Diesel M.D.   On: 12/18/2014 17:47   Ct Pelvis W Contrast  12/18/2014   CLINICAL DATA:  Perineal mass for 1 week, with drainage. Initial encounter.  EXAM: CT PELVIS WITH CONTRAST  TECHNIQUE: Multidetector CT imaging of the pelvis was performed using the standard protocol following the bolus administration of intravenous contrast.  CONTRAST:  195mL OMNIPAQUE IOHEXOL 300 MG/ML  SOLN  COMPARISON:  None.  FINDINGS: There is a focal thick-walled peripherally enhancing abscess along the posterior aspect of the upper scrotum and extending minimally into the perineum, measuring approximately 6.9 x 2.2 x 5.9 cm. Diffuse surrounding soft tissue inflammation and edema are seen, extending inferiorly along the posterior scrotal wall. The testes are not imaged on this study. There is mild asymmetric left-sided soft tissue inflammation along the adjacent perineum. No soft tissue air is seen at this time, though the inferior scrotum is not fully imaged on this study.  No additional focal soft tissue  abnormalities are identified. The anorectal canal is grossly unremarkable. Visualized small and large bowel loops are within normal limits. A small umbilical hernia is seen, containing only fat. The bladder is relatively decompressed; apparent wall thickening likely reflects decompression. The prostate remains normal in size.  Note is made of prominent bilateral inguinal nodes, measuring up to 1.1 cm in short axis, likely reflecting the underlying infection. There is apparent prominence of the vasculature about the spermatic cord bilaterally, likely reflecting acute hyperemia.  No acute osseous abnormalities are seen.  IMPRESSION: 1. Focal thick-walled peripherally enhancing abscess along the posterior aspect of the upper scrotum, extending minimally into the perineum, measuring 6.9 x 2.2 x 5.9 cm. Diffuse surrounding soft tissue inflammation and edema extends inferiorly along the posterior scrotal wall. Mild asymmetric left-sided soft tissue inflammation along the adjacent perineum. No definite evidence for Fournier's gangrene. 2. Prominent bilateral inguinal nodes, measuring up to 1.1 cm in short axis, likely reflecting underlying infection. Associated hyperemia suggested. 3. Small umbilical hernia, containing only fat.   Electronically Signed   By: Garald Balding M.D.   On: 12/18/2014 21:09  EKG Interpretation None      Filed Vitals:   12/18/14 2015 12/18/14 2223 12/19/14 0027 12/19/14 0027  BP: 137/84 136/82 144/76 140/66  Pulse: 101 114 90 90  Temp:   100.4 F (38 C)   TempSrc:   Oral   Resp: 34 11 18 12   Weight:      SpO2: 97% 98% 100% 100%     MDM   Meds given in ED:  Medications  iohexol (OMNIPAQUE) 300 MG/ML solution 100 mL (100 mLs Intravenous Contrast Given 12/18/14 2031)  lidocaine-EPINEPHrine (XYLOCAINE W/EPI) 2 %-1:200000 (PF) injection 10 mL (10 mLs Infiltration Given 12/18/14 2246)  sulfamethoxazole-trimethoprim (BACTRIM DS,SEPTRA DS) 800-160 MG per tablet 1 tablet (1 tablet  Oral Given 12/18/14 2347)  cephALEXin (KEFLEX) capsule 500 mg (500 mg Oral Given 12/18/14 2347)  HYDROcodone-acetaminophen (NORCO/VICODIN) 5-325 MG per tablet 1 tablet (1 tablet Oral Given 12/18/14 2352)    Discharge Medication List as of 12/18/2014 11:43 PM    START taking these medications   Details  HYDROcodone-acetaminophen (NORCO/VICODIN) 5-325 MG per tablet Take 1-2 tablets by mouth every 4 (four) hours as needed for moderate pain or severe pain., Starting 12/18/2014, Until Discontinued, Print        Final diagnoses:  Perineal mass in male  Abscess, perineum  Costochondral chest pain   This is a 49 year old male who presented to the emergency department complaining of an abscess in his perineum. Patient also has been complaining of continued chest wall pain since 12/06/2014. The patient's pain is worse with palpation. The patient does frequent heavy lifting at work and he believes this may have exacerbated his pain. Patient reports he noted an abscess in his perineum about a week and a half ago. The patient denies drainage from his abscess. The patient denies fevers or chills. Consultation to urology was performed by Dr. Eulis Foster. Urology suggested CT pelvis with contrast. [name] CT pelvis with contrast indicated a large thick-walled abscess along the posterior aspect of his upper scrotum extending into the perineum. There is no evidence for Fournier's gangrene on CT or by exam. [name] CBC and BMP are unremarkable. [name] troponin is negative. [name] chest x-ray is unremarkable. Patient has negative ACS workup. [name] chest is tender to palpation and reproduces his chest pain. Incision and drainage of abscess was performed by me. There is a large amount of purulent drainage noted. Abscess was irrigated with a large amount of sterile saline. Abscess was packed with quarter-inch iodoform. The patient is provided his first dose of Bactrim and Keflex in the ED. Patient will be started on a 10  day course of Bactrim and Keflex. I advised patient he needs to follow-up either with his primary care provider, at urgent care or at the emergency department within 24-48 hours for wound recheck. Strict return precautions were provided. Education on wound care provided. Education on signs and symptoms of infection provided. I advised patient to return to the emergency department with new or worsening symptoms or new concerns. The patient and his wife verbalized understanding and agreement with plan.  This patient was discussed with and evaluated by Dr. Eulis Foster who agrees with assessment and plan.      Jeffrey Hays, PA-C 12/19/14 [place], MD 12/19/14 1344

## 2014-12-19 NOTE — ED Notes (Signed)
Pt. Left with all belongings 

## 2014-12-21 ENCOUNTER — Encounter (HOSPITAL_COMMUNITY): Payer: Self-pay | Admitting: Adult Health

## 2014-12-21 ENCOUNTER — Emergency Department (HOSPITAL_COMMUNITY)
Admission: EM | Admit: 2014-12-21 | Discharge: 2014-12-21 | Disposition: A | Discharge status: Home / Self Care | Payer: Self-pay | Attending: Emergency Medicine | Admitting: Emergency Medicine

## 2014-12-21 DIAGNOSIS — Z4801 Encounter for change or removal of surgical wound dressing: Secondary | ICD-10-CM | POA: Insufficient documentation

## 2014-12-21 DIAGNOSIS — E119 Type 2 diabetes mellitus without complications: Secondary | ICD-10-CM | POA: Insufficient documentation

## 2014-12-21 DIAGNOSIS — Z79899 Other long term (current) drug therapy: Secondary | ICD-10-CM | POA: Insufficient documentation

## 2014-12-21 DIAGNOSIS — Z792 Long term (current) use of antibiotics: Secondary | ICD-10-CM | POA: Insufficient documentation

## 2014-12-21 DIAGNOSIS — Z5189 Encounter for other specified aftercare: Secondary | ICD-10-CM

## 2014-12-21 DIAGNOSIS — Z7952 Long term (current) use of systemic steroids: Secondary | ICD-10-CM | POA: Insufficient documentation

## 2014-12-21 MED ORDER — ACETAMINOPHEN 325 MG PO TABS
650.0000 mg | ORAL_TABLET | Freq: Once | ORAL | Status: AC
  Administered 2014-12-21: 650 mg via ORAL
  Filled 2014-12-21: qty 2

## 2014-12-21 MED ORDER — IBUPROFEN 400 MG PO TABS
800.0000 mg | ORAL_TABLET | Freq: Once | ORAL | Status: AC
  Administered 2014-12-21: 800 mg via ORAL
  Filled 2014-12-21: qty 4

## 2014-12-21 NOTE — Discharge Instructions (Signed)
Please follow directions provided. Be sure to return in 3 days for another wound recheck. Continue the warm soaks with soapy water and encourage drainage from the wound. He may take Tylenol or ibuprofen for pain. A note has been provided to stay home from work until next Monday so that you can continue to take good care of this wound. Don't hesitate to return for any new, worsening, or concerning symptoms.   SEEK IMMEDIATE MEDICAL CARE IF:  You have redness, swelling, or increasing pain in the wound.  You notice pus coming from the wound.  You have a fever.  You notice a bad smell coming from the wound or dressing.

## 2014-12-21 NOTE — ED Notes (Signed)
Pt A&OX4, ambulatory at d/ with steady gait, NAD

## 2014-12-21 NOTE — ED Provider Notes (Signed)
CSN: [id number]     Arrival date & time 12/21/14  1943 History   This chart was scribed for non-physician practitioner, Britt Bottom, NP-C working with Jasper Riling. Alvino Chapel, MD, by Chester Holstein, ED Scribe. This patient was seen in room TR05C/TR05C and the patient's care was started at 8:58 PM.       Chief Complaint  Patient presents with  . Follow-up     The history is provided by a relative and the patient. No language interpreter was used.  HPI Comments: Jeffrey [name] is a 49 y.o. male who presents to the Emergency Department for a wound follow-up. Pt was seen on 12/18/14 and treated for several abscesses in his perineum. Pt notes associated pain to the area. Pt has been using proper care procedures and taking the Abx given. Pt's relative notes he will be seen by his PCP on 12/31/14.   Past Medical History  Diagnosis Date  . Diabetes mellitus    Past Surgical History  Procedure Laterality Date  . Back surgery     Family History  Problem Relation Age of Onset  . Diabetes Mother   . Diabetes Father   . Diabetes Sister    History  Substance Use Topics  . Smoking status: Never Smoker   . Smokeless tobacco: Not on file  . Alcohol Use: No    Review of Systems  Skin: Positive for wound.  All other systems reviewed and are negative.     Allergies  Review of patient's allergies indicates no known allergies.  Home Medications   Prior to Admission medications   Medication Sig Start Date End Date Taking? Authorizing Provider  cephALEXin (KEFLEX) 500 MG capsule Take 1 capsule (500 mg total) by mouth 4 (four) times daily. 12/18/14   Verda Cumins Dansie, PA-C  clotrimazole (LOTRIMIN) 1 % cream Apply to affected area 2 times daily 03/11/14   Houston Siren III, MD  gabapentin (NEURONTIN) 400 MG capsule Take 1 capsule (400 mg total) by mouth 3 (three) times daily. 11/12/14   Lorayne Marek, MD  HYDROcodone-acetaminophen (NORCO/VICODIN) 5-325 MG per tablet Take 1-2  tablets by mouth every 4 (four) hours as needed for moderate pain or severe pain. 12/18/14   Verda Cumins Dansie, PA-C  hydrocortisone cream 1 % Apply 1 application topically 2 (two) times daily. 11/12/14   Lorayne Marek, MD  ibuprofen (ADVIL,MOTRIN) 600 MG tablet Take 1 tablet (600 mg total) by mouth every 6 (six) hours as needed. Patient taking differently: Take 600 mg by mouth every 6 (six) hours as needed for moderate pain.  12/05/14   Julianne Rice, MD  insulin glargine (LANTUS) 100 UNIT/ML injection Inject 25 Units into the skin daily.    Historical Provider, MD  insulin lispro (HUMALOG) 100 UNIT/ML injection Inject 8 Units into the skin 4 (four) times daily.    Historical Provider, MD  lisinopril (PRINIVIL,ZESTRIL) 5 MG tablet Take 1 tablet (5 mg total) by mouth daily. 08/26/14   Lorayne Marek, MD  methocarbamol (ROBAXIN) 500 MG tablet Take 2 tablets (1,000 mg total) by mouth every 8 (eight) hours as needed for muscle spasms. 12/05/14   Julianne Rice, MD  sulfamethoxazole-trimethoprim (SEPTRA DS) 800-160 MG per tablet Take 1 tablet by mouth every 12 (twelve) hours. 12/18/14   Verda Cumins Dansie, PA-C   BP 153/81 mmHg  Pulse 91  Temp(Src) 98.1 F (36.7 C) (Oral)  Resp 18  SpO2 96% Physical Exam  Constitutional: He is oriented to person, place, and time. He  appears well-developed and well-nourished. No distress.  HENT:  Head: Normocephalic and atraumatic.  Eyes: Conjunctivae are normal. Right eye exhibits no discharge. Left eye exhibits no discharge. No scleral icterus.  Neck: Normal range of motion. Neck supple.  Cardiovascular: Intact distal pulses.   Pulmonary/Chest: Effort normal.  Genitourinary:     3 well healing incisions      Musculoskeletal: Normal range of motion.  Neurological: He is alert and oriented to person, place, and time. Coordination normal.  Skin: Skin is warm and dry. He is not diaphoretic.  Psychiatric: He has a normal mood and affect. His behavior is  normal.  Nursing note and vitals reviewed.   ED Course  Procedures (including critical care time) DIAGNOSTIC STUDIES: Oxygen Saturation is 96% on room air, normal by my interpretation.    COORDINATION OF CARE: 9:06 PM Discussed treatment plan with patient at beside, the patient agrees with the plan and has no further questions at this time.   Labs Review Labs Reviewed - No data to display  Imaging Review No results found.   EKG Interpretation None      MDM   Final diagnoses:  Encounter for wound re-check   49 yo presenting for wound-check of abscesses.  Abscess appear to be healing well but will need to continue warm, soapy water soaks and return for wound re-check in 3 days. Discussed staying out of work until after follow-up with his PCP next week to allow for further healing.  Pt is well-appearing, in no acute distress and vital signs are stable.  They appear safe to be discharged.  Return precautions provided.    I personally performed the services described in this documentation, which was scribed in my presence. The recorded information has been reviewed and is accurate.  Filed Vitals:   12/21/14 1947 12/21/14 2127  BP: 153/81 141/79  Pulse: 91 90  Temp: 98.1 F (36.7 C) 98.8 F (37.1 C)  TempSrc: Oral Oral  Resp: 18 18  SpO2: 96% 100%   Meds given in ED:  Medications  acetaminophen (TYLENOL) tablet 650 mg (650 mg Oral Given 12/21/14 2129)  ibuprofen (ADVIL,MOTRIN) tablet 800 mg (800 mg Oral Given 12/21/14 2129)    Discharge Medication List as of 12/21/2014  9:27 PM         Britt Bottom, NP 12/23/14 [place]. Alvino Chapel, [place] 12/24/14 [phone]

## 2014-12-21 NOTE — ED Notes (Signed)
Presents for recheck of abscess on bottom. Denies increase in pain, drainage or size.

## 2014-12-24 ENCOUNTER — Ambulatory Visit: Payer: Self-pay | Admitting: Endocrinology

## 2014-12-31 ENCOUNTER — Ambulatory Visit: Payer: Self-pay | Admitting: Internal Medicine

## 2015-01-07 ENCOUNTER — Ambulatory Visit: Payer: Self-pay | Admitting: Internal Medicine

## 2015-01-14 ENCOUNTER — Ambulatory Visit: Payer: Self-pay | Admitting: Endocrinology

## 2015-01-15 ENCOUNTER — Ambulatory Visit: Payer: Self-pay | Admitting: Internal Medicine

## 2015-01-21 ENCOUNTER — Ambulatory Visit: Payer: Self-pay | Attending: Internal Medicine | Admitting: Internal Medicine

## 2015-01-21 ENCOUNTER — Encounter: Payer: Self-pay | Admitting: Internal Medicine

## 2015-01-21 VITALS — BP 110/75 | HR 98 | Temp 99.6°F | Resp 16 | Ht 67.0 in | Wt 185.0 lb

## 2015-01-21 DIAGNOSIS — Z4889 Encounter for other specified surgical aftercare: Secondary | ICD-10-CM | POA: Insufficient documentation

## 2015-01-21 DIAGNOSIS — I10 Essential (primary) hypertension: Secondary | ICD-10-CM | POA: Insufficient documentation

## 2015-01-21 DIAGNOSIS — L739 Follicular disorder, unspecified: Secondary | ICD-10-CM | POA: Insufficient documentation

## 2015-01-21 DIAGNOSIS — Z9889 Other specified postprocedural states: Secondary | ICD-10-CM | POA: Insufficient documentation

## 2015-01-21 DIAGNOSIS — L02215 Cutaneous abscess of perineum: Secondary | ICD-10-CM

## 2015-01-21 DIAGNOSIS — E119 Type 2 diabetes mellitus without complications: Secondary | ICD-10-CM | POA: Insufficient documentation

## 2015-01-21 DIAGNOSIS — M79606 Pain in leg, unspecified: Secondary | ICD-10-CM | POA: Insufficient documentation

## 2015-01-21 DIAGNOSIS — N529 Male erectile dysfunction, unspecified: Secondary | ICD-10-CM | POA: Insufficient documentation

## 2015-01-21 DIAGNOSIS — Z792 Long term (current) use of antibiotics: Secondary | ICD-10-CM | POA: Insufficient documentation

## 2015-01-21 DIAGNOSIS — Z794 Long term (current) use of insulin: Secondary | ICD-10-CM | POA: Insufficient documentation

## 2015-01-21 LAB — GLUCOSE, POCT (MANUAL RESULT ENTRY): POC Glucose: 150 mg/dl — AB (ref 70–99)

## 2015-01-21 MED ORDER — GABAPENTIN 400 MG PO CAPS: 400.0000 mg | ORAL_CAPSULE | Freq: Three times a day (TID) | ORAL | Status: DC

## 2015-01-21 MED ORDER — INSULIN GLARGINE 100 UNIT/ML ~~LOC~~ SOLN: 25.0000 [IU] | Freq: Every day | SUBCUTANEOUS | Status: DC

## 2015-01-21 MED ORDER — LISINOPRIL 5 MG PO TABS: 5.0000 mg | ORAL_TABLET | Freq: Every day | ORAL | Status: DC

## 2015-01-21 MED ORDER — INSULIN LISPRO 100 UNIT/ML ~~LOC~~ SOLN: 8.0000 [IU] | Freq: Four times a day (QID) | SUBCUTANEOUS | Status: DC

## 2015-01-21 MED ORDER — METHOCARBAMOL 500 MG PO TABS: 1000.0000 mg | ORAL_TABLET | Freq: Three times a day (TID) | ORAL | Status: DC | PRN

## 2015-01-21 MED ORDER — SILDENAFIL CITRATE 50 MG PO TABS: 25.0000 mg | ORAL_TABLET | Freq: Every day | ORAL | Status: DC | PRN

## 2015-01-21 MED ORDER — SULFAMETHOXAZOLE-TRIMETHOPRIM 800-160 MG PO TABS: 1.0000 | ORAL_TABLET | Freq: Two times a day (BID) | ORAL | Status: DC

## 2015-01-21 NOTE — Progress Notes (Signed)
Patient seen at [facility] for abscess behind scrotum I& D done with antibiotics given-patient finished RX Pain in chest he was told at hospital it was muscle strain Patient says the Lantus "burns him" and he would like to try something else

## 2015-01-21 NOTE — Progress Notes (Signed)
MRN: [id number] Name: Jeffrey [name]  Sex: male Age: 49 y.o. DOB: 1966/02/02  Allergies: Review of patient's allergies indicates no known allergies.  Chief Complaint  Patient presents with  . Hospitalization Follow-up    HPI: Patient is 49 y.o. male who has to of diabetes hypertension, patient went to the emergency room last month with the symptoms of perineal abscess, EMR reviewed patient had incision and drainage done, subsequently followed again in the emergency room and apparently the incision site was clean and healing, he continued the antibiotic finished, has been applying antibiotic ointment denies any discharge but has low-grade fever he does have a cyst on his lower abdomen ?folliculitis, his diabetes has been uncontrolled, already has been referred to endocrinology but could not make an appointment, patient is requesting refill on his medications.patient is also requesting medication to help him with the erectile dysfunction.  Past Medical History  Diagnosis Date  . Diabetes mellitus     Past Surgical History  Procedure Laterality Date  . Back surgery        Medication List       This list is accurate as of: 01/21/15  3:06 PM.  Always use your most recent med list.               cephALEXin 500 MG capsule  Commonly known as:  KEFLEX  Take 1 capsule (500 mg total) by mouth 4 (four) times daily.     clotrimazole 1 % cream  Commonly known as:  LOTRIMIN  Apply to affected area 2 times daily     gabapentin 400 MG capsule  Commonly known as:  NEURONTIN  Take 1 capsule (400 mg total) by mouth 3 (three) times daily.     HYDROcodone-acetaminophen 5-325 MG per tablet  Commonly known as:  NORCO/VICODIN  Take 1-2 tablets by mouth every 4 (four) hours as needed for moderate pain or severe pain.     hydrocortisone cream 1 %  Apply 1 application topically 2 (two) times daily.     ibuprofen 600 MG tablet  Commonly known as:  ADVIL,MOTRIN  Take 1 tablet (600 mg  total) by mouth every 6 (six) hours as needed.     insulin glargine 100 UNIT/ML injection  Commonly known as:  LANTUS  Inject 0.25 mLs (25 Units total) into the skin daily.     insulin lispro 100 UNIT/ML injection  Commonly known as:  HUMALOG  Inject 0.08 mLs (8 Units total) into the skin 4 (four) times daily.     lisinopril 5 MG tablet  Commonly known as:  PRINIVIL,ZESTRIL  Take 1 tablet (5 mg total) by mouth daily.     methocarbamol 500 MG tablet  Commonly known as:  ROBAXIN  Take 2 tablets (1,000 mg total) by mouth every 8 (eight) hours as needed for muscle spasms.     sildenafil 50 MG tablet  Commonly known as:  VIAGRA  Take 0.5-1 tablets (25-50 mg total) by mouth daily as needed for erectile dysfunction.     sulfamethoxazole-trimethoprim 800-160 MG per tablet  Commonly known as:  SEPTRA DS  Take 1 tablet by mouth every 12 (twelve) hours.        Meds ordered this encounter  Medications  . gabapentin (NEURONTIN) 400 MG capsule    Sig: Take 1 capsule (400 mg total) by mouth 3 (three) times daily.    Dispense:  90 capsule    Refill:  3  . insulin glargine (LANTUS) 100 UNIT/ML injection  Sig: Inject 0.25 mLs (25 Units total) into the skin daily.    Dispense:  10 mL    Refill:  3  . insulin lispro (HUMALOG) 100 UNIT/ML injection    Sig: Inject 0.08 mLs (8 Units total) into the skin 4 (four) times daily.    Dispense:  10 mL    Refill:  3  . lisinopril (PRINIVIL,ZESTRIL) 5 MG tablet    Sig: Take 1 tablet (5 mg total) by mouth daily.    Dispense:  90 tablet    Refill:  3  . methocarbamol (ROBAXIN) 500 MG tablet    Sig: Take 2 tablets (1,000 mg total) by mouth every 8 (eight) hours as needed for muscle spasms.    Dispense:  30 tablet    Refill:  0  . sildenafil (VIAGRA) 50 MG tablet    Sig: Take 0.5-1 tablets (25-50 mg total) by mouth daily as needed for erectile dysfunction.    Dispense:  30 tablet    Refill:  2  . sulfamethoxazole-trimethoprim (SEPTRA DS) 800-160  MG per tablet    Sig: Take 1 tablet by mouth every 12 (twelve) hours.    Dispense:  20 tablet    Refill:  0    Immunization History  Administered Date(s) Administered  . Influenza,inj,Quad PF,36+ Mos 09/17/2014  . Pneumococcal Polysaccharide-23 09/17/2014    Family History  Problem Relation Age of Onset  . Diabetes Mother   . Diabetes Father   . Diabetes Sister     History  Substance Use Topics  . Smoking status: Never Smoker   . Smokeless tobacco: Not on file  . Alcohol Use: No    Review of Systems   As noted in HPI  Filed Vitals:   01/21/15 1427  BP: 110/75  Pulse: 98  Temp: 99.6 F (37.6 C)  Resp: 16    Physical Exam  Physical Exam  Constitutional: No distress.  Eyes: EOM are normal. Pupils are equal, round, and reactive to light.  Neck: Neck supple.  Cardiovascular: Normal rate and regular rhythm.   Pulmonary/Chest: Breath sounds normal. No respiratory distress. He has no wheezes. He has no rales.  Genitourinary:  Perineum area examined incision site healing well no apparent discharge swelling or tenderness   Skin:  Cyst on lower abdomen     CBC    Component Value Date/Time   WBC 8.1 12/18/2014 1716   RBC 5.20 12/18/2014 1716   HGB 12.3* 12/18/2014 1716   HCT 38.1* 12/18/2014 1716   PLT 336 12/18/2014 1716   MCV 73.3* 12/18/2014 1716   LYMPHSABS 1.3 08/21/2013 1204   MONOABS 0.2 08/21/2013 1204   EOSABS 0.0 08/21/2013 1204   BASOSABS 0.0 08/21/2013 1204    CMP     Component Value Date/Time   NA 135 12/18/2014 1716   K 3.7 12/18/2014 1716   CL 101 12/18/2014 1716   CO2 26 12/18/2014 1716   GLUCOSE 284* 12/18/2014 1716   BUN 7 12/18/2014 1716   CREATININE 0.72 12/18/2014 1716   CREATININE 0.70 08/26/2014 1756   CALCIUM 9.2 12/18/2014 1716   PROT 7.7 12/05/2014 0013   ALBUMIN 3.7 12/05/2014 0013   AST 20 12/05/2014 0013   ALT 15 12/05/2014 0013   ALKPHOS 77 12/05/2014 0013   BILITOT 0.3 12/05/2014 0013   GFRNONAA >90 12/18/2014  1716   GFRNONAA >89 08/26/2014 1756   GFRAA >90 12/18/2014 1716   GFRAA >89 08/26/2014 1756    Lab Results  Component Value Date/Time  CHOL 191 08/21/2013 12:04 PM    No components found for: HGA1C  Lab Results  Component Value Date/Time   AST 20 12/05/2014 12:13 AM    Assessment and Plan  Type 2 diabetes mellitus without complication - Plan: Results for orders placed or performed in visit on 01/21/15  Glucose (CBG)  Result Value Ref Range   POC Glucose 150 (A) 70 - 99 mg/dl   Patient is given refill on his medications, will repeat A1c in 3 months  insulin glargine (LANTUS) 100 UNIT/ML injection, insulin lispro (HUMALOG) 100 UNIT/ML injection, Glucose (CBG), Ambulatory referral to Endocrinology  Pain of lower extremity, unspecified laterality - Plan: gabapentin (NEURONTIN) 400 MG capsule  Essential hypertension - Plan:Continue with  lisinopril (PRINIVIL,ZESTRIL) 5 MG tablet, will check blood chemistry on the following visit  Abscess, perineum Status post incision/drainage last month, currently healing well  Erectile dysfunction, unspecified erectile dysfunction type - Plan: sildenafil (VIAGRA) 50 MG tablet  Folliculitis - Plan: sulfamethoxazole-trimethoprim (SEPTRA DS) 800-160 MG per tablet   Health Maintenance  -Vaccinations:  uptodate with flu shot and pneumovax     The patient was given clear instructions to go to ER or return to medical center if symptoms don't improve, worsen or new problems develop. The patient verbalized understanding.    Return in about 3 months (around 04/21/2015) for diabetes.  Lorayne Marek, MD

## 2015-02-25 ENCOUNTER — Other Ambulatory Visit: Payer: Self-pay | Admitting: Internal Medicine

## 2015-03-04 ENCOUNTER — Encounter: Payer: Self-pay | Admitting: Internal Medicine

## 2015-03-04 ENCOUNTER — Ambulatory Visit: Payer: Self-pay | Attending: Internal Medicine | Admitting: Internal Medicine

## 2015-03-04 VITALS — BP 121/85 | HR 96 | Temp 98.6°F | Resp 16 | Ht 68.0 in | Wt 187.0 lb

## 2015-03-04 DIAGNOSIS — L97509 Non-pressure chronic ulcer of other part of unspecified foot with unspecified severity: Secondary | ICD-10-CM

## 2015-03-04 DIAGNOSIS — E13621 Other specified diabetes mellitus with foot ulcer: Secondary | ICD-10-CM

## 2015-03-04 DIAGNOSIS — E1165 Type 2 diabetes mellitus with hyperglycemia: Secondary | ICD-10-CM | POA: Insufficient documentation

## 2015-03-04 DIAGNOSIS — E119 Type 2 diabetes mellitus without complications: Secondary | ICD-10-CM

## 2015-03-04 DIAGNOSIS — E11621 Type 2 diabetes mellitus with foot ulcer: Secondary | ICD-10-CM | POA: Insufficient documentation

## 2015-03-04 DIAGNOSIS — I10 Essential (primary) hypertension: Secondary | ICD-10-CM | POA: Insufficient documentation

## 2015-03-04 LAB — COMPLETE METABOLIC PANEL WITH GFR
ALK PHOS: 86 U/L (ref 39–117)
ALT: 13 U/L (ref 0–53)
AST: 13 U/L (ref 0–37)
Albumin: 4.1 g/dL (ref 3.5–5.2)
BUN: 12 mg/dL (ref 6–23)
CHLORIDE: 101 meq/L (ref 96–112)
CO2: 28 mEq/L (ref 19–32)
Calcium: 9.7 mg/dL (ref 8.4–10.5)
Creat: 0.7 mg/dL (ref 0.50–1.35)
Glucose, Bld: 249 mg/dL — ABNORMAL HIGH (ref 70–99)
Potassium: 5.1 mEq/L (ref 3.5–5.3)
Sodium: 138 mEq/L (ref 135–145)
TOTAL PROTEIN: 7.5 g/dL (ref 6.0–8.3)
Total Bilirubin: 0.5 mg/dL (ref 0.2–1.2)

## 2015-03-04 LAB — GLUCOSE, POCT (MANUAL RESULT ENTRY): POC Glucose: 261 mg/dl — AB (ref 70–99)

## 2015-03-04 LAB — POCT GLYCOSYLATED HEMOGLOBIN (HGB A1C): HEMOGLOBIN A1C: 9.6

## 2015-03-04 MED ORDER — INSULIN ASPART 100 UNIT/ML ~~LOC~~ SOLN
10.0000 [IU] | Freq: Once | SUBCUTANEOUS | Status: AC
  Administered 2015-03-04: 10 [IU] via SUBCUTANEOUS

## 2015-03-04 MED ORDER — "INSULIN SYRINGE 31G X 5/16"" 1 ML MISC": Status: DC

## 2015-03-04 MED ORDER — METHOCARBAMOL 500 MG PO TABS: 1000.0000 mg | ORAL_TABLET | Freq: Three times a day (TID) | ORAL | Status: DC | PRN

## 2015-03-04 MED ORDER — CEPHALEXIN 500 MG PO CAPS: 500.0000 mg | ORAL_CAPSULE | Freq: Four times a day (QID) | ORAL | Status: DC

## 2015-03-04 MED ORDER — SULFAMETHOXAZOLE-TRIMETHOPRIM 800-160 MG PO TABS: 1.0000 | ORAL_TABLET | Freq: Two times a day (BID) | ORAL | Status: DC

## 2015-03-04 MED ORDER — INSULIN PEN NEEDLE 31G X 5 MM MISC: Status: DC

## 2015-03-04 NOTE — Progress Notes (Signed)
MRN: [id number] Name: Jeffrey [name]  Sex: male Age: 49 y.o. DOB: 1966-11-29  Allergies: Review of patient's allergies indicates no known allergies.  Chief Complaint  Patient presents with  . Follow-up    HPI: Patient is 49 y.o. male who has to of diabetes, hypertension, diabetic neuropathy comes today for followup her as per patient is taking Lantus 20 units each bedtime and NovoLog 8 units with each meal, he denies any hypoglycemic symptoms but has not been checking his blood sugar regularly, he is accompanied with a family member who reported to have noticed bump/ulcer in his right foot, patient denies any pain in the feet, denies any fever chills, denies noticing any discharge.  Past Medical History  Diagnosis Date  . Diabetes mellitus     Past Surgical History  Procedure Laterality Date  . Back surgery        Medication List       This list is accurate as of: 03/04/15  1:02 PM.  Always use your most recent med list.               cephALEXin 500 MG capsule  Commonly known as:  KEFLEX  Take 1 capsule (500 mg total) by mouth 4 (four) times daily.     clotrimazole 1 % cream  Commonly known as:  LOTRIMIN  Apply to affected area 2 times daily     gabapentin 400 MG capsule  Commonly known as:  NEURONTIN  Take 1 capsule (400 mg total) by mouth 3 (three) times daily.     HYDROcodone-acetaminophen 5-325 MG per tablet  Commonly known as:  NORCO/VICODIN  Take 1-2 tablets by mouth every 4 (four) hours as needed for moderate pain or severe pain.     hydrocortisone cream 1 %  Apply 1 application topically 2 (two) times daily.     ibuprofen 600 MG tablet  Commonly known as:  ADVIL,MOTRIN  Take 1 tablet (600 mg total) by mouth every 6 (six) hours as needed.     insulin glargine 100 UNIT/ML injection  Commonly known as:  LANTUS  Inject 0.25 mLs (25 Units total) into the skin daily.     insulin lispro 100 UNIT/ML injection  Commonly known as:  HUMALOG  Inject  0.08 mLs (8 Units total) into the skin 4 (four) times daily.     Insulin Pen Needle 31G X 5 MM Misc  Check blood sugar TID & QHS     INSULIN SYRINGE 1CC/31GX5/16" 31G X 5/16" 1 ML Misc  Check blood sugar TID & QHS     lisinopril 5 MG tablet  Commonly known as:  PRINIVIL,ZESTRIL  Take 1 tablet (5 mg total) by mouth daily.     methocarbamol 500 MG tablet  Commonly known as:  ROBAXIN  Take 2 tablets (1,000 mg total) by mouth every 8 (eight) hours as needed for muscle spasms.     sildenafil 50 MG tablet  Commonly known as:  VIAGRA  Take 0.5-1 tablets (25-50 mg total) by mouth daily as needed for erectile dysfunction.     sulfamethoxazole-trimethoprim 800-160 MG per tablet  Commonly known as:  SEPTRA DS  Take 1 tablet by mouth every 12 (twelve) hours.        Meds ordered this encounter  Medications  . insulin aspart (novoLOG) injection 10 Units    Sig:   . methocarbamol (ROBAXIN) 500 MG tablet    Sig: Take 2 tablets (1,000 mg total) by mouth every 8 (eight) hours  as needed for muscle spasms.    Dispense:  30 tablet    Refill:  0  . sulfamethoxazole-trimethoprim (SEPTRA DS) 800-160 MG per tablet    Sig: Take 1 tablet by mouth every 12 (twelve) hours.    Dispense:  20 tablet    Refill:  0  . cephALEXin (KEFLEX) 500 MG capsule    Sig: Take 1 capsule (500 mg total) by mouth 4 (four) times daily.    Dispense:  40 capsule    Refill:  0  . Insulin Syringe-Needle U-100 (INSULIN SYRINGE 1CC/31GX5/16") 31G X 5/16" 1 ML MISC    Sig: Check blood sugar TID & QHS    Dispense:  100 each    Refill:  2  . Insulin Pen Needle 31G X 5 MM MISC    Sig: Check blood sugar TID & QHS    Dispense:  100 each    Refill:  2    Immunization History  Administered Date(s) Administered  . Influenza,inj,Quad PF,36+ Mos 09/17/2014  . Pneumococcal Polysaccharide-23 09/17/2014    Family History  Problem Relation Age of Onset  . Diabetes Mother   . Diabetes Father   . Diabetes Sister      History  Substance Use Topics  . Smoking status: Never Smoker   . Smokeless tobacco: Not on file  . Alcohol Use: No    Review of Systems   As noted in HPI  Filed Vitals:   03/04/15 1214  BP: 121/85  Pulse: 96  Temp: 98.6 F (37 C)  Resp: 16    Physical Exam  Physical Exam  Constitutional: No distress.  Eyes: EOM are normal. Pupils are equal, round, and reactive to light.  Cardiovascular: Normal rate and regular rhythm.   Pulmonary/Chest: Breath sounds normal. No respiratory distress. He has no wheezes. He has no rales.  Musculoskeletal:  Bilateral feet impaired monofilament test Right foot callus, big toenail discoloration, minimal erythema at the nail bed, no apparent discharge, laceration at the plantar aspect of right big toe    CBC    Component Value Date/Time   WBC 8.1 12/18/2014 1716   RBC 5.20 12/18/2014 1716   HGB 12.3* 12/18/2014 1716   HCT 38.1* 12/18/2014 1716   PLT 336 12/18/2014 1716   MCV 73.3* 12/18/2014 1716   LYMPHSABS 1.3 08/21/2013 1204   MONOABS 0.2 08/21/2013 1204   EOSABS 0.0 08/21/2013 1204   BASOSABS 0.0 08/21/2013 1204    CMP     Component Value Date/Time   NA 135 12/18/2014 1716   K 3.7 12/18/2014 1716   CL 101 12/18/2014 1716   CO2 26 12/18/2014 1716   GLUCOSE 284* 12/18/2014 1716   BUN 7 12/18/2014 1716   CREATININE 0.72 12/18/2014 1716   CREATININE 0.70 08/26/2014 1756   CALCIUM 9.2 12/18/2014 1716   PROT 7.7 12/05/2014 0013   ALBUMIN 3.7 12/05/2014 0013   AST 20 12/05/2014 0013   ALT 15 12/05/2014 0013   ALKPHOS 77 12/05/2014 0013   BILITOT 0.3 12/05/2014 0013   GFRNONAA >90 12/18/2014 1716   GFRNONAA >89 08/26/2014 1756   GFRAA >90 12/18/2014 1716   GFRAA >89 08/26/2014 1756    Lab Results  Component Value Date/Time   CHOL 191 08/21/2013 12:04 PM    No components found for: HGA1C  Lab Results  Component Value Date/Time   AST 20 12/05/2014 12:13 AM    Assessment and Plan  Type 2 diabetes mellitus  without complication - Plan:  Results for  orders placed or performed in visit on 03/04/15  Glucose (CBG)  Result Value Ref Range   POC Glucose 261 (A) 70 - 99 mg/dl  HgB A1c  Result Value Ref Range   Hemoglobin A1C 9.60    HgB A1c has trended down compared to last visit but is still diabetes is uncontrolled, have advised patient to increase the dose of Lantus to 32 units each bedtime, increased the dose of NovoLog 10 units with each meal, continue to monitor blood sugar at home keep the fingerstick log, insulin aspart (novoLOG) injection 10 Units, COMPLETE METABOLIC PANEL WITH GFR  Essential hypertension - Plan: blood pressure is well controlled, continue with current meds COMPLETE METABOLIC PANEL WITH GFR  Foot ulcer due to secondary DM - Plan: sulfamethoxazole-trimethoprim (SEPTRA DS) 800-160 MG per tablet, cephALEXin (KEFLEX) 500 MG capsule, Ambulatory referral to [place] Maintenance  -Vaccinations:  Up-to-date with flu shot and Pneumovax  The patient was given clear instructions to go to ER or return to medical center if symptoms don't improve, worsen or new problems develop. The patient verbalized understanding.    Return in about 3 months (around 06/04/2015) for diabetes, hypertension.   This note has been created with Surveyor, quantity. Any transcriptional errors are unintentional.    Lorayne Marek, MD

## 2015-03-04 NOTE — Progress Notes (Signed)
Pt is here following up on his HTN and diabetes. Pt has an ulcer on on his great right toe.

## 2015-03-31 ENCOUNTER — Other Ambulatory Visit: Payer: Self-pay | Admitting: Internal Medicine

## 2015-04-27 ENCOUNTER — Other Ambulatory Visit: Payer: Self-pay | Admitting: Internal Medicine

## 2015-06-04 ENCOUNTER — Ambulatory Visit: Payer: Self-pay | Attending: Internal Medicine | Admitting: Internal Medicine

## 2015-06-04 ENCOUNTER — Encounter: Payer: Self-pay | Admitting: Internal Medicine

## 2015-06-04 VITALS — BP 131/85 | HR 83 | Temp 97.0°F | Resp 16 | Wt 182.0 lb

## 2015-06-04 DIAGNOSIS — B351 Tinea unguium: Secondary | ICD-10-CM | POA: Insufficient documentation

## 2015-06-04 DIAGNOSIS — E1165 Type 2 diabetes mellitus with hyperglycemia: Secondary | ICD-10-CM | POA: Insufficient documentation

## 2015-06-04 DIAGNOSIS — E1140 Type 2 diabetes mellitus with diabetic neuropathy, unspecified: Secondary | ICD-10-CM | POA: Insufficient documentation

## 2015-06-04 DIAGNOSIS — L97509 Non-pressure chronic ulcer of other part of unspecified foot with unspecified severity: Secondary | ICD-10-CM | POA: Insufficient documentation

## 2015-06-04 DIAGNOSIS — E139 Other specified diabetes mellitus without complications: Secondary | ICD-10-CM

## 2015-06-04 DIAGNOSIS — I10 Essential (primary) hypertension: Secondary | ICD-10-CM | POA: Insufficient documentation

## 2015-06-04 DIAGNOSIS — L02423 Furuncle of right upper limb: Secondary | ICD-10-CM | POA: Insufficient documentation

## 2015-06-04 DIAGNOSIS — Z794 Long term (current) use of insulin: Secondary | ICD-10-CM | POA: Insufficient documentation

## 2015-06-04 DIAGNOSIS — E13621 Other specified diabetes mellitus with foot ulcer: Secondary | ICD-10-CM

## 2015-06-04 DIAGNOSIS — E11621 Type 2 diabetes mellitus with foot ulcer: Secondary | ICD-10-CM | POA: Insufficient documentation

## 2015-06-04 DIAGNOSIS — L0292 Furuncle, unspecified: Secondary | ICD-10-CM

## 2015-06-04 DIAGNOSIS — M79606 Pain in leg, unspecified: Secondary | ICD-10-CM | POA: Insufficient documentation

## 2015-06-04 DIAGNOSIS — Z9114 Patient's other noncompliance with medication regimen: Secondary | ICD-10-CM | POA: Insufficient documentation

## 2015-06-04 DIAGNOSIS — Z9111 Patient's noncompliance with dietary regimen: Secondary | ICD-10-CM | POA: Insufficient documentation

## 2015-06-04 LAB — POCT URINALYSIS DIPSTICK
Bilirubin, UA: NEGATIVE
Glucose, UA: 500
Ketones, UA: NEGATIVE
Leukocytes, UA: NEGATIVE
Nitrite, UA: NEGATIVE
PH UA: 6.5
PROTEIN UA: NEGATIVE
SPEC GRAV UA: 1.01
UROBILINOGEN UA: 0.2

## 2015-06-04 LAB — POCT GLYCOSYLATED HEMOGLOBIN (HGB A1C): HEMOGLOBIN A1C: 13.9

## 2015-06-04 LAB — GLUCOSE, POCT (MANUAL RESULT ENTRY)
POC Glucose: 428 mg/dl — AB (ref 70–99)
POC Glucose: 450 mg/dl — AB (ref 70–99)

## 2015-06-04 MED ORDER — INSULIN ASPART 100 UNIT/ML ~~LOC~~ SOLN
20.0000 [IU] | Freq: Once | SUBCUTANEOUS | Status: AC
Start: 2015-06-04 — End: 2015-06-04
  Administered 2015-06-04: 20 [IU] via SUBCUTANEOUS

## 2015-06-04 MED ORDER — GABAPENTIN 400 MG PO CAPS: 400.0000 mg | ORAL_CAPSULE | Freq: Three times a day (TID) | ORAL | Status: DC

## 2015-06-04 MED ORDER — SULFAMETHOXAZOLE-TRIMETHOPRIM 800-160 MG PO TABS: 1.0000 | ORAL_TABLET | Freq: Two times a day (BID) | ORAL | Status: DC

## 2015-06-04 MED ORDER — METHOCARBAMOL 500 MG PO TABS: 500.0000 mg | ORAL_TABLET | Freq: Four times a day (QID) | ORAL | Status: DC | PRN

## 2015-06-04 MED ORDER — TERBINAFINE HCL 250 MG PO TABS: 250.0000 mg | ORAL_TABLET | Freq: Every day | ORAL | Status: DC

## 2015-06-04 NOTE — Patient Instructions (Signed)
Diabetes Mellitus and Food It is important for you to manage your blood sugar (glucose) level. Your blood glucose level can be greatly affected by what you eat. Eating healthier foods in the appropriate amounts throughout the day at about the same time each day will help you control your blood glucose level. It can also help slow or prevent worsening of your diabetes mellitus. Healthy eating may even help you improve the level of your blood pressure and reach or maintain a healthy weight.  HOW CAN FOOD AFFECT ME? Carbohydrates Carbohydrates affect your blood glucose level more than any other type of food. Your dietitian will help you determine how many carbohydrates to eat at each meal and teach you how to count carbohydrates. Counting carbohydrates is important to keep your blood glucose at a healthy level, especially if you are using insulin or taking certain medicines for diabetes mellitus. Alcohol Alcohol can cause sudden decreases in blood glucose (hypoglycemia), especially if you use insulin or take certain medicines for diabetes mellitus. Hypoglycemia can be a life-threatening condition. Symptoms of hypoglycemia (sleepiness, dizziness, and disorientation) are similar to symptoms of having too much alcohol.  If your health care provider has given you approval to drink alcohol, do so in moderation and use the following guidelines:  Women should not have more than one drink per day, and men should not have more than two drinks per day. One drink is equal to:  12 oz of beer.  5 oz of wine.  1 oz of hard liquor.  Do not drink on an empty stomach.  Keep yourself hydrated. Have water, diet soda, or unsweetened iced tea.  Regular soda, juice, and other mixers might contain a lot of carbohydrates and should be counted. WHAT FOODS ARE NOT RECOMMENDED? As you make food choices, it is important to remember that all foods are not the same. Some foods have fewer nutrients per serving than other  foods, even though they might have the same number of calories or carbohydrates. It is difficult to get your body what it needs when you eat foods with fewer nutrients. Examples of foods that you should avoid that are high in calories and carbohydrates but low in nutrients include:  Trans fats (most processed foods list trans fats on the Nutrition Facts label).  Regular soda.  Juice.  Candy.  Sweets, such as cake, pie, doughnuts, and cookies.  Fried foods. WHAT FOODS CAN I EAT? Have nutrient-rich foods, which will nourish your body and keep you healthy. The food you should eat also will depend on several factors, including:  The calories you need.  The medicines you take.  Your weight.  Your blood glucose level.  Your blood pressure level.  Your cholesterol level. You also should eat a variety of foods, including:  Protein, such as meat, poultry, fish, tofu, nuts, and seeds (lean animal proteins are best).  Fruits.  Vegetables.  Dairy products, such as milk, cheese, and yogurt (low fat is best).  Breads, grains, pasta, cereal, rice, and beans.  Fats such as olive oil, trans fat-free margarine, canola oil, avocado, and olives. DOES EVERYONE WITH DIABETES MELLITUS HAVE THE SAME MEAL PLAN? Because every person with diabetes mellitus is different, there is not one meal plan that works for everyone. It is very important that you meet with a dietitian who will help you create a meal plan that is just right for you. Document Released: 08/25/2005 Document Revised: 12/03/2013 Document Reviewed: 10/25/2013 [employer] Patient Information 2015 [employer]. This   information is not intended to replace advice given to you by your health care provider. Make sure you discuss any questions you have with your health care provider.  

## 2015-06-04 NOTE — Progress Notes (Signed)
MRN: [id number] Name: Jeffrey [name]  Sex: male Age: 49 y.o. DOB: 05-03-1966  Allergies: Review of patient's allergies indicates no known allergies.  Chief Complaint  Patient presents with  . Follow-up    HPI: Patient is 49 y.o. male who history of uncontrolled diabetes hypertension, neuropathy, accompanied with a family member comes for followup her, on the last visit he was advised to increase the dose of Lantus as well as NovoLog which she has not done today he has not taken his insulin and had drank a can of soda, today's blood pressure is elevated, denies any headache dizziness chest and shortness of breath, has history of diabetic foot ulcer on the last visit he was prescribed antibiotic and reports improvement denies any fever chills he has still some boils on his forearm which occasionally drain, he was given 20 units of insulin, his urine does not show any ketones.  Past Medical History  Diagnosis Date  . Diabetes mellitus     Past Surgical History  Procedure Laterality Date  . Back surgery        Medication List       This list is accurate as of: 06/04/15 10:10 AM.  Always use your most recent med list.               cephALEXin 500 MG capsule  Commonly known as:  KEFLEX  Take 1 capsule (500 mg total) by mouth 4 (four) times daily.     clotrimazole 1 % cream  Commonly known as:  LOTRIMIN  Apply to affected area 2 times daily     gabapentin 400 MG capsule  Commonly known as:  NEURONTIN  Take 1 capsule (400 mg total) by mouth 3 (three) times daily.     HYDROcodone-acetaminophen 5-325 MG per tablet  Commonly known as:  NORCO/VICODIN  Take 1-2 tablets by mouth every 4 (four) hours as needed for moderate pain or severe pain.     hydrocortisone cream 1 %  Apply 1 application topically 2 (two) times daily.     ibuprofen 600 MG tablet  Commonly known as:  ADVIL,MOTRIN  Take 1 tablet (600 mg total) by mouth every 6 (six) hours as needed.     insulin  glargine 100 UNIT/ML injection  Commonly known as:  LANTUS  Inject 0.25 mLs (25 Units total) into the skin daily.     insulin lispro 100 UNIT/ML injection  Commonly known as:  HUMALOG  Inject 0.08 mLs (8 Units total) into the skin 4 (four) times daily.     Insulin Pen Needle 31G X 5 MM Misc  Check blood sugar TID & QHS     INSULIN SYRINGE 1CC/31GX5/16" 31G X 5/16" 1 ML Misc  Check blood sugar TID & QHS     lisinopril 5 MG tablet  Commonly known as:  PRINIVIL,ZESTRIL  Take 1 tablet (5 mg total) by mouth daily.     methocarbamol 500 MG tablet  Commonly known as:  ROBAXIN  Take 1 tablet (500 mg total) by mouth every 6 (six) hours as needed for muscle spasms.     sildenafil 50 MG tablet  Commonly known as:  VIAGRA  Take 0.5-1 tablets (25-50 mg total) by mouth daily as needed for erectile dysfunction.     sulfamethoxazole-trimethoprim 800-160 MG per tablet  Commonly known as:  BACTRIM DS,SEPTRA DS  Take 1 tablet by mouth every 12 (twelve) hours.     terbinafine 250 MG tablet  Commonly known as:  LAMISIL  Take 1 tablet (250 mg total) by mouth daily.        Meds ordered this encounter  Medications  . insulin aspart (novoLOG) injection 20 Units    Sig:   . gabapentin (NEURONTIN) 400 MG capsule    Sig: Take 1 capsule (400 mg total) by mouth 3 (three) times daily.    Dispense:  90 capsule    Refill:  3  . terbinafine (LAMISIL) 250 MG tablet    Sig: Take 1 tablet (250 mg total) by mouth daily.    Dispense:  30 tablet    Refill:  1  . methocarbamol (ROBAXIN) 500 MG tablet    Sig: Take 1 tablet (500 mg total) by mouth every 6 (six) hours as needed for muscle spasms.    Dispense:  30 tablet    Refill:  2  . sulfamethoxazole-trimethoprim (BACTRIM DS,SEPTRA DS) 800-160 MG per tablet    Sig: Take 1 tablet by mouth every 12 (twelve) hours.    Dispense:  20 tablet    Refill:  0    Immunization History  Administered Date(s) Administered  . Influenza,inj,Quad PF,36+ Mos  09/17/2014  . Pneumococcal Polysaccharide-23 09/17/2014    Family History  Problem Relation Age of Onset  . Diabetes Mother   . Diabetes Father   . Diabetes Sister     History  Substance Use Topics  . Smoking status: Never Smoker   . Smokeless tobacco: Not on file  . Alcohol Use: No    Review of Systems   As noted in HPI  Filed Vitals:   06/04/15 0943  BP: 131/85  Pulse: 83  Temp: 97 F (36.1 C)  Resp: 16    Physical Exam  Physical Exam  Constitutional: No distress.  Eyes: EOM are normal. Pupils are equal, round, and reactive to light.  Cardiovascular: Normal rate and regular rhythm.   Pulmonary/Chest: Breath sounds normal. No respiratory distress. He has no wheezes. He has no rales.  Musculoskeletal:  Right foot has callus toenail discoloration, the laceration noticed on the last visit is healed, no apparent discharge  Skin:  Boil/sore noticed on the right forearm    CBC    Component Value Date/Time   WBC 8.1 12/18/2014 1716   RBC 5.20 12/18/2014 1716   HGB 12.3* 12/18/2014 1716   HCT 38.1* 12/18/2014 1716   PLT 336 12/18/2014 1716   MCV 73.3* 12/18/2014 1716   LYMPHSABS 1.3 08/21/2013 1204   MONOABS 0.2 08/21/2013 1204   EOSABS 0.0 08/21/2013 1204   BASOSABS 0.0 08/21/2013 1204    CMP     Component Value Date/Time   NA 138 03/04/2015 1233   K 5.1 03/04/2015 1233   CL 101 03/04/2015 1233   CO2 28 03/04/2015 1233   GLUCOSE 249* 03/04/2015 1233   BUN 12 03/04/2015 1233   CREATININE 0.70 03/04/2015 1233   CREATININE 0.72 12/18/2014 1716   CALCIUM 9.7 03/04/2015 1233   PROT 7.5 03/04/2015 1233   ALBUMIN 4.1 03/04/2015 1233   AST 13 03/04/2015 1233   ALT 13 03/04/2015 1233   ALKPHOS 86 03/04/2015 1233   BILITOT 0.5 03/04/2015 1233   GFRNONAA >89 03/04/2015 1233   GFRNONAA >90 12/18/2014 1716   GFRAA >89 03/04/2015 1233   GFRAA >90 12/18/2014 1716    Lab Results  Component Value Date/Time   CHOL 191 08/21/2013 12:04 PM    Lab Results   Component Value Date/Time   HGBA1C 13.90 06/04/2015 09:37 AM  Lab Results  Component Value Date/Time   AST 13 03/04/2015 12:33 PM    Assessment and Plan  Other specified diabetes mellitus without complications - Plan:  Results for orders placed or performed in visit on 06/04/15  Glucose (CBG)  Result Value Ref Range   POC Glucose 450.0 (A) 70 - 99 mg/dl  HgB A1c  Result Value Ref Range   Hemoglobin A1C 13.90   Urinalysis Dipstick  Result Value Ref Range   Color, UA yellow    Clarity, UA clear    Glucose, UA 500    Bilirubin, UA neg    Ketones, UA neg    Spec Grav, UA 1.010    Blood, UA trace    pH, UA 6.5    Protein, UA neg    Urobilinogen, UA 0.2    Nitrite, UA neg    Leukocytes, UA Negative Negative  Glucose (CBG)  Result Value Ref Range   POC Glucose 428 (A) 70 - 99 mg/dl   Diabetes is uncontrolled and hemoglobin A1c has trended up , patient has not been compliant with diet modification today's blood sugar is elevated since he is not taking his insulin also had 10 of soda before he came after he was given 20 units of insulin his blood sugar is improving, urine is negative for ketones, he's going to increase the dose of Lantus to 32 units as well as NovoLog 10 units with meals, keep the fingerstick log , he will come back in 2 weeks for nurse visit / CBG check  Glucose (CBG), HgB A1c, Urinalysis Dipstick, insulin aspart (novoLOG) injection 20 Units, Glucose (CBG)  Pain of lower extremity, unspecified laterality - Plan: gabapentin (NEURONTIN) 400 MG capsule  Foot ulcer due to secondary DM - Plan: sulfamethoxazole-trimethoprim (BACTRIM DS,SEPTRA DS) 800-160 MG per tablet, patient has also been referred to podiatry.  Onychomycosis - Plan: terbinafine (LAMISIL) 250 MG tablet  Boils - Plan: sulfamethoxazole-trimethoprim (BACTRIM DS,SEPTRA DS) 800-160 MG per tablet    Return in about 3 months (around 09/04/2015) for CBG  check in 2 weeks/Nurse Visit.   This note  has been created with Surveyor, quantity. Any transcriptional errors are unintentional.    Lorayne Marek, MD

## 2015-06-04 NOTE — Progress Notes (Signed)
Patient here for follow up on his diabetes and HTN Patient is also following up with his right great toe that was discolored Patient also concerned about random sores that are on his arms and torso= Some of them are open at this time

## 2015-07-01 ENCOUNTER — Other Ambulatory Visit: Payer: Self-pay | Admitting: Pharmacist

## 2015-07-01 ENCOUNTER — Ambulatory Visit: Payer: Self-pay | Attending: Internal Medicine | Admitting: *Deleted

## 2015-07-01 VITALS — BP 111/68 | HR 91 | Temp 98.3°F | Resp 20 | Ht 68.0 in | Wt 183.8 lb

## 2015-07-01 DIAGNOSIS — E119 Type 2 diabetes mellitus without complications: Secondary | ICD-10-CM

## 2015-07-01 DIAGNOSIS — E1165 Type 2 diabetes mellitus with hyperglycemia: Secondary | ICD-10-CM

## 2015-07-01 DIAGNOSIS — Z794 Long term (current) use of insulin: Secondary | ICD-10-CM | POA: Insufficient documentation

## 2015-07-01 DIAGNOSIS — I10 Essential (primary) hypertension: Secondary | ICD-10-CM

## 2015-07-01 LAB — GLUCOSE, POCT (MANUAL RESULT ENTRY): POC Glucose: 236 mg/dl — AB (ref 70–99)

## 2015-07-01 MED ORDER — INSULIN LISPRO 100 UNIT/ML ~~LOC~~ SOLN: 10.0000 [IU] | Freq: Three times a day (TID) | SUBCUTANEOUS | Status: DC

## 2015-07-01 MED ORDER — INSULIN ASPART 100 UNIT/ML ~~LOC~~ SOLN: SUBCUTANEOUS | Status: DC

## 2015-07-01 MED ORDER — INSULIN GLARGINE 100 UNIT/ML ~~LOC~~ SOLN: 32.0000 [IU] | Freq: Every day | SUBCUTANEOUS | Status: DC

## 2015-07-01 NOTE — Progress Notes (Signed)
Patient presents with fiance for BP check, CBG and record review for T2DM, however, patient did not bring log or meter. States he has not checked BS in 2 weeks due to need for new battery for glucometer. Patient given blood sugar log and instructed on use. Instructed to bring to all future visits. Med list reviewed; patient/fiance reports taking lantus 32 units q HS. States this is how PCP instructed at last OV. Also taking 12 units humalog tid AC. Again, states this is how PCP instructed at last OV Patient and fiance educated on importance of knowing BS prior to injecting humalog insulin due to risk for hypoglycemia. S/sx of hypoglycemia and immediate actions to take discussed in detail Denies increased thirst and urination, blurred vision Fiance thinks patient may be having herpes breakout all over body. States nothing visible at present since took Bactrim in June  CBG 236 AM fasting 2 hours after eating eggs and sausage on 2 slices of bread and water  Lab Results  Component Value Date   HGBA1C 13.90 06/04/2015   Filed Vitals:   07/01/15 1057  BP: 111/68  Pulse: 91  Temp: 98.3 F (36.8 C)  Resp: 20    Patient/fiance states they will buy battery for glucometer today and begin checking BS tid AC prior to injecting humalog insulin. Will hold humalog if BS < 70  Med list updated to show lantus and humalog units per PCP note at last OV. Patient and fiance instructed that per last PCP note humalog was to be 10 units prior to meals  Patient advised to make appt with PCP to discuss herpes outbreaks and to review CBG log  Patient given literature on T2DM, Diabetes and Food, Diabetes and Exercise, Basic Carb Counting, Diabetes and Foot Care, and Hypoglycemia

## 2015-07-01 NOTE — Patient Instructions (Addendum)
Low Blood Sugar Low blood sugar (hypoglycemia) means that the level of sugar in your blood is lower than it should be. Signs of low blood sugar include:  Getting sweaty.  Feeling hungry.  Feeling dizzy or weak.  Feeling sleepier than normal.  Feeling nervous.  Headaches.  Having a fast heartbeat. Low blood sugar can happen fast and can be an emergency. Your doctor can do tests to check your blood sugar level. You can have low blood sugar and not have diabetes. HOME CARE  Check your blood sugar as told by your doctor. If it is less than 70 mg/dl or as told by your doctor, take 1 of the following:  3 to 4 glucose tablets.   cup clear juice.   cup soda pop, not diet.  1 cup milk.  5 to 6 hard candies.  Recheck blood sugar after 15 minutes. Repeat until it is at the right level.  Eat a snack if it is more than 1 hour until the next meal.  Only take medicine as told by your doctor.  Do not skip meals. Eat on time.  Do not drink alcohol except with meals.  Check your blood glucose before driving.  Check your blood glucose before and after exercise.  Always carry treatment with you, such as glucose pills.  Always wear a medical alert bracelet if you have diabetes. GET HELP RIGHT AWAY IF:   Your blood glucose goes below 70 mg/dl or as told by your doctor, and you:  Are confused.  Are not able to swallow.  Pass out (faint).  You cannot treat yourself. You may need someone to help you.  You have low blood sugar problems often.  You have problems from your medicines.  You are not feeling better after 3 to 4 days.  You have vision changes. MAKE SURE YOU:   Understand these instructions.  Will watch this condition.  Will get help right away if you are not doing well or get worse. Document Released: 02/22/2010 Document Revised: 02/20/2012 Document Reviewed: 02/22/2010 [facility] Patient Information 2015 [place], [place]. This information is not intended to  replace advice given to you by your health care provider. Make sure you discuss any questions you have with your health care provider. Basic Carbohydrate Counting for Diabetes Mellitus Carbohydrate counting is a method for keeping track of the amount of carbohydrates you eat. Eating carbohydrates naturally increases the level of sugar (glucose) in your blood, so it is important for you to know the amount that is okay for you to have in every meal. Carbohydrate counting helps keep the level of glucose in your blood within normal limits. The amount of carbohydrates allowed is different for every person. A dietitian can help you calculate the amount that is right for you. Once you know the amount of carbohydrates you can have, you can count the carbohydrates in the foods you want to eat. Carbohydrates are found in the following foods: Grains, such as breads and cereals. Dried beans and soy products. Starchy vegetables, such as potatoes, peas, and corn. Fruit and fruit juices. Milk and yogurt. Sweets and snack foods, such as cake, cookies, candy, chips, soft drinks, and fruit drinks. CARBOHYDRATE COUNTING There are two ways to count the carbohydrates in your food. You can use either of the methods or a combination of both. Reading the "Nutrition Facts" on [place] The "Nutrition Facts" is an area that is included on the labels of almost all packaged food and beverages in the  [place]. It includes the serving size of that food or beverage and information about the nutrients in each serving of the food, including the grams (g) of carbohydrate per serving.  Decide the number of servings of this food or beverage that you will be able to eat or drink. Multiply that number of servings by the number of grams of carbohydrate that is listed on the label for that serving. The total will be the amount of carbohydrates you will be having when you eat or drink this food or beverage. Learning Standard Serving  Sizes of Food When you eat food that is not packaged or does not include "Nutrition Facts" on the label, you need to measure the servings in order to count the amount of carbohydrates.A serving of most carbohydrate-rich foods contains about 15 g of carbohydrates. The following list includes serving sizes of carbohydrate-rich foods that provide 15 g ofcarbohydrate per serving:  1 slice of bread (1 oz) or 1 six-inch tortilla.   of a hamburger bun or English muffin. 4-6 crackers.  cup unsweetened dry cereal.   cup hot cereal.  cup rice or pasta.   cup mashed potatoes or  of a large baked potato. 1 cup fresh fruit or one small piece of fruit.   cup canned or frozen fruit or fruit juice. 1 cup milk.  cup plain fat-free yogurt or yogurt sweetened with artificial sweeteners.  cup cooked dried beans or starchy vegetable, such as peas, corn, or potatoes.  Decide the number of standard-size servings that you will eat. Multiply that number of servings by 15 (the grams of carbohydrates in that serving). For example, if you eat 2 cups of strawberries, you will have eaten 2 servings and 30 g of carbohydrates (2 servings x 15 g = 30 g). For foods such as soups and casseroles, in which more than one food is mixed in, you will need to count the carbohydrates in each food that is included. EXAMPLE OF CARBOHYDRATE COUNTING Sample Dinner 3 oz chicken breast.  cup of brown rice.  cup of corn. 1 cup milk.  1 cup strawberries with sugar-free whipped topping.  Carbohydrate Calculation Step 1: Identify the foods that contain carbohydrates:  Rice.  Corn.  Milk.  Strawberries. Step 2:Calculate the number of servings eaten of each:  2 servings of rice.  1 serving of corn.  1 serving of milk.  1 serving of strawberries. Step 3: Multiply each of those number of servings by 15 g:  2 servings of rice x 15 g = 30 g.  1 serving of corn x 15 g = 15 g.  1 serving of milk x 15 g = 15 g.   1 serving of strawberries x 15 g = 15 g. Step 4: Add together all of the amounts to find the total grams of carbohydrates eaten: 30 g + 15 g + 15 g + 15 g = 75 g. Document Released: 11/28/2005 Document Revised: 04/14/2014 Document Reviewed: 10/25/2013 [facility] Patient Information 2015 [place], [place]. This information is not intended to replace advice given to you by your health care provider. Make sure you discuss any questions you have with your health care provider. Diabetes and Foot Care Diabetes may cause you to have problems because of poor blood supply (circulation) to your feet and legs. This may cause the skin on your feet to become thinner, break easier, and heal more slowly. Your skin may become dry, and the skin may peel and crack. You may also  have nerve damage in your legs and feet causing decreased feeling in them. You may not notice minor injuries to your feet that could lead to infections or more serious problems. Taking care of your feet is one of the most important things you can do for yourself.  HOME CARE INSTRUCTIONS Wear shoes at all times, even in the house. Do not go barefoot. Bare feet are easily injured. Check your feet daily for blisters, cuts, and redness. If you cannot see the bottom of your feet, use a mirror or ask someone for help. Wash your feet with warm water (do not use hot water) and mild soap. Then pat your feet and the areas between your toes until they are completely dry. Do not soak your feet as this can dry your skin. Apply a moisturizing lotion or petroleum jelly (that does not contain alcohol and is unscented) to the skin on your feet and to dry, brittle toenails. Do not apply lotion between your toes. Trim your toenails straight across. Do not dig under them or around the cuticle. File the edges of your nails with an emery board or nail file. Do not cut corns or calluses or try to remove them with medicine. Wear clean socks or stockings every day. Make  sure they are not too tight. Do not wear knee-high stockings since they may decrease blood flow to your legs. Wear shoes that fit properly and have enough cushioning. To break in new shoes, wear them for just a few hours a day. This prevents you from injuring your feet. Always look in your shoes before you put them on to be sure there are no objects inside. Do not cross your legs. This may decrease the blood flow to your feet. If you find a minor scrape, cut, or break in the skin on your feet, keep it and the skin around it clean and dry. These areas may be cleansed with mild soap and water. Do not cleanse the area with peroxide, alcohol, or iodine. When you remove an adhesive bandage, be sure not to damage the skin around it. If you have a wound, look at it several times a day to make sure it is healing. Do not use heating pads or hot water bottles. They may burn your skin. If you have lost feeling in your feet or legs, you may not know it is happening until it is too late. Make sure your health care provider performs a complete foot exam at least annually or more often if you have foot problems. Report any cuts, sores, or bruises to your health care provider immediately. SEEK MEDICAL CARE IF:  You have an injury that is not healing. You have cuts or breaks in the skin. You have an ingrown nail. You notice redness on your legs or feet. You feel burning or tingling in your legs or feet. You have pain or cramps in your legs and feet. Your legs or feet are numb. Your feet always feel cold. SEEK IMMEDIATE MEDICAL CARE IF:  There is increasing redness, swelling, or pain in or around a wound. There is a red line that goes up your leg. Pus is coming from a wound. You develop a fever or as directed by your health care provider. You notice a bad smell coming from an ulcer or wound. Document Released: 11/25/2000 Document Revised: 07/31/2013 Document Reviewed: 05/07/2013 [facility] Patient Information  2015 [place], [place]. This information is not intended to replace advice given to you by your health  care provider. Make sure you discuss any questions you have with your health care provider. Diabetes and Exercise Exercising regularly is important. It is not just about losing weight. It has many health benefits, such as: Improving your overall fitness, flexibility, and endurance. Increasing your bone density. Helping with weight control. Decreasing your body fat. Increasing your muscle strength. Reducing stress and tension. Improving your overall health. People with diabetes who exercise gain additional benefits because exercise: Reduces appetite. Improves the body's use of blood sugar (glucose). Helps lower or control blood glucose. Decreases blood pressure. Helps control blood lipids (such as cholesterol and triglycerides). Improves the body's use of the hormone insulin by: Increasing the body's insulin sensitivity. Reducing the body's insulin needs. Decreases the risk for heart disease because exercising: Lowers cholesterol and triglycerides levels. Increases the levels of good cholesterol (such as high-density lipoproteins [HDL]) in the body. Lowers blood glucose levels. YOUR ACTIVITY PLAN  Choose an activity that you enjoy and set realistic goals. Your health care provider or diabetes educator can help you make an activity plan that works for you. Exercise regularly as directed by your health care provider. This includes: Performing resistance training twice a week such as push-ups, sit-ups, lifting weights, or using resistance bands. Performing 150 minutes of cardio exercises each week such as walking, running, or playing sports. Staying active and spending no more than 90 minutes at one time being inactive. Even short bursts of exercise are good for you. Three 10-minute sessions spread throughout the day are just as beneficial as a single 30-minute session. Some exercise ideas  include: Taking the dog for a walk. Taking the stairs instead of the elevator. Dancing to your favorite song. Doing an exercise video. Doing your favorite exercise with a friend. RECOMMENDATIONS FOR EXERCISING WITH TYPE 1 OR TYPE 2 DIABETES  Check your blood glucose before exercising. If blood glucose levels are greater than 240 mg/dL, check for urine ketones. Do not exercise if ketones are present. Avoid injecting insulin into areas of the body that are going to be exercised. For example, avoid injecting insulin into: The arms when playing tennis. The legs when jogging. Keep a record of: Food intake before and after you exercise. Expected peak times of insulin action. Blood glucose levels before and after you exercise. The type and amount of exercise you have done. Review your records with your health care provider. Your health care provider will help you to develop guidelines for adjusting food intake and insulin amounts before and after exercising. If you take insulin or oral hypoglycemic agents, watch for signs and symptoms of hypoglycemia. They include: Dizziness. Shaking. Sweating. Chills. Confusion. Drink plenty of water while you exercise to prevent dehydration or heat stroke. Body water is lost during exercise and must be replaced. Talk to your health care provider before starting an exercise program to make sure it is safe for you. Remember, almost any type of activity is better than none. Document Released: 02/18/2004 Document Revised: 04/14/2014 Document Reviewed: 05/07/2013 [facility] Patient Information 2015 [place], [place]. This information is not intended to replace advice given to you by your health care provider. Make sure you discuss any questions you have with your health care provider. Diabetes Mellitus and Food It is important for you to manage your blood sugar (glucose) level. Your blood glucose level can be greatly affected by what you eat. Eating healthier foods in  the appropriate amounts throughout the day at about the same time each day will help you control your  blood glucose level. It can also help slow or prevent worsening of your diabetes mellitus. Healthy eating may even help you improve the level of your blood pressure and reach or maintain a healthy weight.  HOW CAN FOOD AFFECT ME? Carbohydrates Carbohydrates affect your blood glucose level more than any other type of food. Your dietitian will help you determine how many carbohydrates to eat at each meal and teach you how to count carbohydrates. Counting carbohydrates is important to keep your blood glucose at a healthy level, especially if you are using insulin or taking certain medicines for diabetes mellitus. Alcohol Alcohol can cause sudden decreases in blood glucose (hypoglycemia), especially if you use insulin or take certain medicines for diabetes mellitus. Hypoglycemia can be a life-threatening condition. Symptoms of hypoglycemia (sleepiness, dizziness, and disorientation) are similar to symptoms of having too much alcohol.  If your health care provider has given you approval to drink alcohol, do so in moderation and use the following guidelines: Women should not have more than one drink per day, and men should not have more than two drinks per day. One drink is equal to: 12 oz of beer. 5 oz of wine. 1 oz of hard liquor. Do not drink on an empty stomach. Keep yourself hydrated. Have water, diet soda, or unsweetened iced tea. Regular soda, juice, and other mixers might contain a lot of carbohydrates and should be counted. WHAT FOODS ARE NOT RECOMMENDED? As you make food choices, it is important to remember that all foods are not the same. Some foods have fewer nutrients per serving than other foods, even though they might have the same number of calories or carbohydrates. It is difficult to get your body what it needs when you eat foods with fewer nutrients. Examples of foods that you should  avoid that are high in calories and carbohydrates but low in nutrients include: Trans fats (most processed foods list trans fats on the Nutrition Facts label). Regular soda. Juice. Candy. Sweets, such as cake, pie, doughnuts, and cookies. Fried foods. WHAT FOODS CAN I EAT? Have nutrient-rich foods, which will nourish your body and keep you healthy. The food you should eat also will depend on several factors, including: The calories you need. The medicines you take. Your weight. Your blood glucose level. Your blood pressure level. Your cholesterol level. You also should eat a variety of foods, including: Protein, such as meat, poultry, fish, tofu, nuts, and seeds (lean animal proteins are best). Fruits. Vegetables. Dairy products, such as milk, cheese, and yogurt (low fat is best). Breads, grains, pasta, cereal, rice, and beans. Fats such as olive oil, trans fat-free margarine, canola oil, avocado, and olives. DOES EVERYONE WITH DIABETES MELLITUS HAVE THE SAME MEAL PLAN? Because every person with diabetes mellitus is different, there is not one meal plan that works for everyone. It is very important that you meet with a dietitian who will help you create a meal plan that is just right for you. Document Released: 08/25/2005 Document Revised: 12/03/2013 Document Reviewed: 10/25/2013 [facility] Patient Information 2015 [place], [place]. This information is not intended to replace advice given to you by your health care provider. Make sure you discuss any questions you have with your health care provider. Type 2 Diabetes Mellitus Type 2 diabetes mellitus is a long-term (chronic) disease. In type 2 diabetes: The pancreas does not make enough of a hormone called insulin. The cells in the body do not respond as well to the insulin that is made. Both of the  above can happen. Normally, insulin moves sugars from food into tissue cells. This gives you energy. If you have type 2 diabetes, sugars  cannot be moved into tissue cells. This causes high blood sugar (hyperglycemia).  HOME CARE Have your hemoglobin A1c level checked twice a year. The level shows if your diabetes is under control or out of control. Test your blood sugar level every day as told by your doctor. Check your ketone levels by testing your pee (urine) when you are sick and as told. Take your diabetes or insulin medicine as told by your doctor. Never run out of insulin. Adjust how much insulin you give yourself based on how many carbs (carbohydrates) you eat. Carbs are in many foods, such as fruits, vegetables, whole grains, and dairy products. Have a healthy snack between every healthy meal. Have 3 meals and 3 snacks a day. Lose weight if you are overweight. Carry a medical alert card or wear your medical alert jewelry. Carry a 15-gram carb snack with you at all times. Examples include: Glucose pills, 3 or 4. Glucose gel, 15-gram tube. Raisins, 2 tablespoons (24 grams). Jelly beans, 6. Animal crackers, 8. Regular (not diet) pop, 4 ounces (120 milliliters). Gummy treats, 9. Notice low blood sugar (hypoglycemia) symptoms, such as: Shaking (tremors). Trouble thinking clearly. Sweating. Faster heart rate. Headache. Dry mouth. Hunger. Crabbiness (irritability). Being worried or tense (anxious). Restless sleep. A change in speech or coordination. Confusion. Treat low blood sugar right away. If you are alert and can swallow, follow the 15:15 rule: Take 15-20 grams of a rapid-acting glucose or carb. This includes glucose gel, glucose pills, or 4 ounces (120 milliliters) of fruit juice, regular pop, or low-fat milk. Check your blood sugar level 15 minutes after taking the glucose. Take 15-20 grams more of glucose if the repeat blood sugar level is still 70 mg/dL (milligrams/deciliter) or below. Eat a meal or snack within 1 hour of the blood sugar levels going back to normal. Notice early symptoms of high blood  sugar, such as: Being really thirsty or drinking a lot (polydipsia). Peeing a lot (polyuria). Do at least 150 minutes of physical activity a week or as told. Split the 150 minutes of activity up during the week. Do not do 150 minutes of activity in one day. Perform exercises, such as weight lifting, at least 2 times a week or as told. Spend no more than 90 minutes at one time inactive. Adjust your insulin or food intake as needed if you start a new exercise or sport. Follow your sick-day plan when you are not able to eat or drink as usual. Do not smoke, chew tobacco, or use electronic cigarettes. Women who are not pregnant should drink no more than 1 drink a day. Men should drink no more than 2 drinks a day. Only drink alcohol with food. Ask your doctor if alcohol is safe for you. Tell your doctor if you drink alcohol several times during the week. See your doctor regularly. Schedule an eye exam soon after you are told you have diabetes. Schedule exams once every year. Check your skin and feet every day. Check for cuts, bruises, redness, nail problems, bleeding, blisters, or sores. A doctor should do a foot exam once a year. Brush your teeth and gums twice a day. Floss once a day. Visit your dentist regularly. Share your diabetes plan with your workplace or school. Stay up-to-date with shots that fight against diseases (immunizations). Learn how to deal with stress. Get diabetes  education and support as needed. Ask your doctor for special help if: You need help to maintain or improve how you do things on your own. You need help to maintain or improve the quality of your life. You have foot or hand problems. You have trouble cleaning yourself, dressing, eating, or doing physical activity. GET HELP IF: You are unable to eat or drink for more than 6 hours. You feel sick to your stomach (nauseous) or throw up (vomit) for more than 6 hours. Your blood sugar level is over 240 mg/dL. There is  a change in mental status. You get another serious illness. You have watery poop (diarrhea) for more than 6 hours. You have been sick or have had a fever for 2 or more days and are not getting better. You have pain when you are active. GET HELP RIGHT AWAY IF: You have trouble breathing. Your ketone levels are higher than your doctor says they should be. MAKE SURE YOU: Understand these instructions. Will watch your condition. Will get help right away if you are not doing well or get worse. Document Released: 09/06/2008 Document Revised: 04/14/2014 Document Reviewed: 06/29/2012 [facility] Patient Information 2015 [place], [place]. This information is not intended to replace advice given to you by your health care provider. Make sure you discuss any questions you have with your health care provider.

## 2015-07-08 ENCOUNTER — Ambulatory Visit: Payer: Self-pay | Admitting: Internal Medicine

## 2015-07-15 ENCOUNTER — Ambulatory Visit: Payer: Self-pay | Admitting: Internal Medicine

## 2015-09-09 ENCOUNTER — Emergency Department (HOSPITAL_COMMUNITY)
Admission: EM | Admit: 2015-09-09 | Discharge: 2015-09-09 | Discharge status: Against Medical Advice | Payer: No Typology Code available for payment source | Attending: Emergency Medicine | Admitting: Emergency Medicine

## 2015-09-09 ENCOUNTER — Encounter (HOSPITAL_COMMUNITY): Payer: Self-pay | Admitting: *Deleted

## 2015-09-09 ENCOUNTER — Emergency Department (HOSPITAL_COMMUNITY)
Admission: EM | Admit: 2015-09-09 | Discharge: 2015-09-09 | Disposition: A | Discharge status: Home / Self Care | Payer: No Typology Code available for payment source | Attending: Emergency Medicine | Admitting: Emergency Medicine

## 2015-09-09 DIAGNOSIS — S3992XA Unspecified injury of lower back, initial encounter: Secondary | ICD-10-CM | POA: Diagnosis not present

## 2015-09-09 DIAGNOSIS — Z79899 Other long term (current) drug therapy: Secondary | ICD-10-CM | POA: Insufficient documentation

## 2015-09-09 DIAGNOSIS — E119 Type 2 diabetes mellitus without complications: Secondary | ICD-10-CM | POA: Diagnosis not present

## 2015-09-09 DIAGNOSIS — S4991XA Unspecified injury of right shoulder and upper arm, initial encounter: Secondary | ICD-10-CM | POA: Insufficient documentation

## 2015-09-09 DIAGNOSIS — Y9389 Activity, other specified: Secondary | ICD-10-CM | POA: Insufficient documentation

## 2015-09-09 DIAGNOSIS — S199XXA Unspecified injury of neck, initial encounter: Secondary | ICD-10-CM | POA: Insufficient documentation

## 2015-09-09 DIAGNOSIS — Y998 Other external cause status: Secondary | ICD-10-CM | POA: Insufficient documentation

## 2015-09-09 DIAGNOSIS — Z794 Long term (current) use of insulin: Secondary | ICD-10-CM | POA: Insufficient documentation

## 2015-09-09 DIAGNOSIS — Y999 Unspecified external cause status: Secondary | ICD-10-CM | POA: Insufficient documentation

## 2015-09-09 DIAGNOSIS — Y92410 Unspecified street and highway as the place of occurrence of the external cause: Secondary | ICD-10-CM | POA: Diagnosis not present

## 2015-09-09 DIAGNOSIS — M79601 Pain in right arm: Secondary | ICD-10-CM

## 2015-09-09 MED ORDER — METHOCARBAMOL 500 MG PO TABS: 500.0000 mg | ORAL_TABLET | Freq: Two times a day (BID) | ORAL | Status: DC

## 2015-09-09 MED ORDER — IBUPROFEN 400 MG PO TABS
800.0000 mg | ORAL_TABLET | Freq: Once | ORAL | Status: AC
  Administered 2015-09-09: 800 mg via ORAL
  Filled 2015-09-09: qty 2

## 2015-09-09 MED ORDER — NAPROXEN 500 MG PO TABS
500.0000 mg | ORAL_TABLET | Freq: Two times a day (BID) | ORAL | Status: DC
Start: 2015-09-09 — End: 2016-02-20
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
## 2015-09-09 NOTE — ED Notes (Signed)
Pt not seen by RN or PA. Pt left rt "emergency".
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
## 2015-09-09 NOTE — ED Notes (Signed)
Pt is in stable condition upon d/c and ambulates from ED. 
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
## 2015-09-25 ENCOUNTER — Encounter (HOSPITAL_COMMUNITY): Payer: Self-pay | Admitting: Family Medicine

## 2015-09-25 ENCOUNTER — Inpatient Hospital Stay (HOSPITAL_COMMUNITY)
Admission: EM | Admit: 2015-09-25 | Discharge: 2015-09-29 | DRG: 603 | Disposition: A | Discharge status: Home / Self Care | Payer: Self-pay | Attending: Internal Medicine | Admitting: Internal Medicine

## 2015-09-25 DIAGNOSIS — B9561 Methicillin susceptible Staphylococcus aureus infection as the cause of diseases classified elsewhere: Secondary | ICD-10-CM | POA: Diagnosis present

## 2015-09-25 DIAGNOSIS — E1140 Type 2 diabetes mellitus with diabetic neuropathy, unspecified: Secondary | ICD-10-CM | POA: Diagnosis present

## 2015-09-25 DIAGNOSIS — Z8249 Family history of ischemic heart disease and other diseases of the circulatory system: Secondary | ICD-10-CM

## 2015-09-25 DIAGNOSIS — L0390 Cellulitis, unspecified: Secondary | ICD-10-CM | POA: Diagnosis present

## 2015-09-25 DIAGNOSIS — L03312 Cellulitis of back [any part except buttock]: Secondary | ICD-10-CM | POA: Diagnosis present

## 2015-09-25 DIAGNOSIS — Z79899 Other long term (current) drug therapy: Secondary | ICD-10-CM

## 2015-09-25 DIAGNOSIS — I10 Essential (primary) hypertension: Secondary | ICD-10-CM | POA: Diagnosis present

## 2015-09-25 DIAGNOSIS — L02212 Cutaneous abscess of back [any part, except buttock]: Principal | ICD-10-CM | POA: Diagnosis present

## 2015-09-25 DIAGNOSIS — E1165 Type 2 diabetes mellitus with hyperglycemia: Secondary | ICD-10-CM | POA: Diagnosis present

## 2015-09-25 DIAGNOSIS — Z794 Long term (current) use of insulin: Secondary | ICD-10-CM

## 2015-09-25 DIAGNOSIS — Z833 Family history of diabetes mellitus: Secondary | ICD-10-CM

## 2015-09-25 HISTORY — DX: Essential (primary) hypertension: I10

## 2015-09-25 LAB — BASIC METABOLIC PANEL
Anion gap: 10 (ref 5–15)
BUN: 9 mg/dL (ref 6–20)
CHLORIDE: 98 mmol/L — AB (ref 101–111)
CO2: 25 mmol/L (ref 22–32)
Calcium: 9.4 mg/dL (ref 8.9–10.3)
Creatinine, Ser: 0.79 mg/dL (ref 0.61–1.24)
GFR calc Af Amer: >60 mL/min (ref >60)
GFR calc non Af Amer: >60 mL/min (ref >60)
Glucose, Bld: 371 mg/dL — ABNORMAL HIGH (ref 65–99)
POTASSIUM: 4.1 mmol/L (ref 3.5–5.1)
SODIUM: 133 mmol/L — AB (ref 135–145)

## 2015-09-25 LAB — CBC WITH DIFFERENTIAL/PLATELET
Basophils Absolute: 0.1 10*3/uL (ref 0.0–0.1)
Basophils Relative: 1 %
EOS ABS: 0 10*3/uL (ref 0.0–0.7)
Eosinophils Relative: 0 %
HCT: 39.4 % (ref 39.0–52.0)
HEMOGLOBIN: 12.7 g/dL — AB (ref 13.0–17.0)
LYMPHS ABS: 1.2 10*3/uL (ref 0.7–4.0)
LYMPHS PCT: 13 %
MCH: 24.4 pg — AB (ref 26.0–34.0)
MCHC: 32.2 g/dL (ref 30.0–36.0)
MCV: 75.8 fL — AB (ref 78.0–100.0)
MONOS PCT: 9 %
Monocytes Absolute: 0.9 10*3/uL (ref 0.1–1.0)
NEUTROS PCT: 77 %
Neutro Abs: 7.5 10*3/uL (ref 1.7–7.7)
Platelets: 232 10*3/uL (ref 150–400)
RBC: 5.2 MIL/uL (ref 4.22–5.81)
RDW: 15.1 % (ref 11.5–15.5)
WBC: 9.7 10*3/uL (ref 4.0–10.5)

## 2015-09-25 LAB — I-STAT CG4 LACTIC ACID, ED
LACTIC ACID, VENOUS: 1.52 mmol/L (ref 0.5–2.0)
LACTIC ACID, VENOUS: 0.63 mmol/L (ref 0.5–2.0)

## 2015-09-25 LAB — GLUCOSE, CAPILLARY: Glucose-Capillary: 251 mg/dL — ABNORMAL HIGH (ref 65–99)

## 2015-09-25 MED ORDER — LIDOCAINE-EPINEPHRINE (PF) 2 %-1:200000 IJ SOLN
10.0000 mL | Freq: Once | INTRAMUSCULAR | Status: AC
  Administered 2015-09-25: 10 mL
  Filled 2015-09-25: qty 20

## 2015-09-25 MED ORDER — SODIUM CHLORIDE 0.9 % IV BOLUS (SEPSIS)
1000.0000 mL | Freq: Once | INTRAVENOUS | Status: AC
  Administered 2015-09-25: 1000 mL via INTRAVENOUS

## 2015-09-25 MED ORDER — GABAPENTIN 400 MG PO CAPS
400.0000 mg | ORAL_CAPSULE | Freq: Three times a day (TID) | ORAL | Status: DC
  Administered 2015-09-25 – 2015-09-29 (×11): 400 mg via ORAL
  Filled 2015-09-25 (×11): qty 1

## 2015-09-25 MED ORDER — INSULIN ASPART 100 UNIT/ML ~~LOC~~ SOLN
0.0000 [IU] | Freq: Three times a day (TID) | SUBCUTANEOUS | Status: DC
  Administered 2015-09-26: 8 [IU] via SUBCUTANEOUS
  Administered 2015-09-26 – 2015-09-27 (×4): 5 [IU] via SUBCUTANEOUS
  Administered 2015-09-28: 2 [IU] via SUBCUTANEOUS
  Administered 2015-09-28: 3 [IU] via SUBCUTANEOUS
  Administered 2015-09-28: 5 [IU] via SUBCUTANEOUS
  Administered 2015-09-29 (×2): 3 [IU] via SUBCUTANEOUS

## 2015-09-25 MED ORDER — DOCUSATE SODIUM 100 MG PO CAPS
100.0000 mg | ORAL_CAPSULE | Freq: Two times a day (BID) | ORAL | Status: DC
  Administered 2015-09-25 – 2015-09-29 (×8): 100 mg via ORAL
  Filled 2015-09-25 (×8): qty 1

## 2015-09-25 MED ORDER — VANCOMYCIN HCL IN DEXTROSE 1-5 GM/200ML-% IV SOLN
1000.0000 mg | Freq: Two times a day (BID) | INTRAVENOUS | Status: DC
  Administered 2015-09-26 – 2015-09-29 (×7): 1000 mg via INTRAVENOUS
  Filled 2015-09-25 (×8): qty 200

## 2015-09-25 MED ORDER — METHOCARBAMOL 500 MG PO TABS: 500.0000 mg | ORAL_TABLET | Freq: Three times a day (TID) | ORAL | Status: DC | PRN

## 2015-09-25 MED ORDER — INSULIN GLARGINE 100 UNIT/ML ~~LOC~~ SOLN
15.0000 [IU] | Freq: Every day | SUBCUTANEOUS | Status: DC
  Administered 2015-09-26 – 2015-09-27 (×2): 15 [IU] via SUBCUTANEOUS
  Filled 2015-09-25 (×2): qty 0.15

## 2015-09-25 MED ORDER — BISACODYL 5 MG PO TBEC
5.0000 mg | DELAYED_RELEASE_TABLET | Freq: Every day | ORAL | Status: DC | PRN
Start: 2015-09-25 — End: 2015-09-29

## 2015-09-25 MED ORDER — ENOXAPARIN SODIUM 40 MG/0.4ML ~~LOC~~ SOLN
40.0000 mg | Freq: Every day | SUBCUTANEOUS | Status: DC
  Administered 2015-09-26 – 2015-09-29 (×4): 40 mg via SUBCUTANEOUS
  Filled 2015-09-25 (×4): qty 0.4

## 2015-09-25 MED ORDER — MORPHINE SULFATE (PF) 2 MG/ML IV SOLN
1.0000 mg | INTRAVENOUS | Status: DC | PRN
  Administered 2015-09-26 (×3): 1 mg via INTRAVENOUS
  Filled 2015-09-25 (×3): qty 1

## 2015-09-25 MED ORDER — INSULIN ASPART 100 UNIT/ML ~~LOC~~ SOLN
10.0000 [IU] | Freq: Three times a day (TID) | SUBCUTANEOUS | Status: DC
  Administered 2015-09-26 – 2015-09-29 (×9): 10 [IU] via SUBCUTANEOUS

## 2015-09-25 MED ORDER — ONDANSETRON HCL 4 MG/2ML IJ SOLN
4.0000 mg | Freq: Four times a day (QID) | INTRAMUSCULAR | Status: DC | PRN
Start: 2015-09-25 — End: 2015-09-29

## 2015-09-25 MED ORDER — INSULIN ASPART 100 UNIT/ML ~~LOC~~ SOLN
0.0000 [IU] | Freq: Every day | SUBCUTANEOUS | Status: DC
  Administered 2015-09-26: 3 [IU] via SUBCUTANEOUS
  Administered 2015-09-26: 2 [IU] via SUBCUTANEOUS

## 2015-09-25 MED ORDER — MORPHINE SULFATE (PF) 4 MG/ML IV SOLN
4.0000 mg | Freq: Once | INTRAVENOUS | Status: AC
  Administered 2015-09-25: 4 mg via INTRAVENOUS
  Filled 2015-09-25: qty 1

## 2015-09-25 MED ORDER — LISINOPRIL 5 MG PO TABS
5.0000 mg | ORAL_TABLET | Freq: Every day | ORAL | Status: DC
  Administered 2015-09-26 – 2015-09-29 (×4): 5 mg via ORAL
  Filled 2015-09-25 (×4): qty 1

## 2015-09-25 MED ORDER — ONDANSETRON HCL 4 MG PO TABS: 4.0000 mg | ORAL_TABLET | Freq: Four times a day (QID) | ORAL | Status: DC | PRN

## 2015-09-25 MED ORDER — VANCOMYCIN HCL 10 G IV SOLR
1750.0000 mg | Freq: Once | INTRAVENOUS | Status: AC
  Administered 2015-09-25: 1750 mg via INTRAVENOUS
  Filled 2015-09-25: qty 1750

## 2015-09-25 MED ORDER — TERBINAFINE HCL 250 MG PO TABS: 250.0000 mg | ORAL_TABLET | Freq: Every day | ORAL | Status: DC

## 2015-09-25 MED ORDER — ACETAMINOPHEN 500 MG PO TABS
1000.0000 mg | ORAL_TABLET | Freq: Once | ORAL | Status: AC
  Administered 2015-09-25: 1000 mg via ORAL
  Filled 2015-09-25: qty 2

## 2015-09-25 MED ORDER — SODIUM CHLORIDE 0.9 % IV SOLN
INTRAVENOUS | Status: AC
  Administered 2015-09-25: 1000 mL via INTRAVENOUS

## 2015-09-25 MED ORDER — OXYCODONE HCL 5 MG PO TABS
5.0000 mg | ORAL_TABLET | ORAL | Status: DC | PRN
  Administered 2015-09-27: 5 mg via ORAL
  Filled 2015-09-25: qty 1

## 2015-09-25 NOTE — ED Notes (Signed)
PA at bedside to I/D cyst.
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
## 2015-09-25 NOTE — Progress Notes (Signed)
ANTIBIOTIC CONSULT NOTE - INITIAL  Pharmacy Consult for Vancomycin Indication: Cellulitis  No Known Allergies  Patient Measurements: TBW 85 kg as of Sept. 2016  Vital Signs: Temp: 99.9 F (37.7 C) (10/14 1825) Temp Source: Oral (10/14 1825) BP: 161/86 mmHg (10/14 1825) Pulse Rate: 94 (10/14 1825) Intake/Output from previous day:   Intake/Output from this shift:    Labs: No results for input(s): WBC, HGB, PLT, LABCREA, CREATININE in the last 72 hours. CrCl cannot be calculated (Unknown ideal weight.). No results for input(s): VANCOTROUGH, VANCOPEAK, VANCORANDOM, GENTTROUGH, GENTPEAK, GENTRANDOM, TOBRATROUGH, TOBRAPEAK, TOBRARND, AMIKACINPEAK, AMIKACINTROU, AMIKACIN in the last 72 hours.   Microbiology: No results found for this or any previous visit (from the past 720 hour(s)).  Medical History: Past Medical History  Diagnosis Date  . Diabetes mellitus     Assessment: 49 yo M presents on 10/14 with cellulitis on left mid back with possible abscess. Pharmacy consulted to dose vancomycin. Tmax is 99.9, WBC wnl. SCr 0.79, CrCl 90-153ml/min. Wound cx collected.  Goal of Therapy:  Vancomycin trough level 10-15 mcg/ml  Resolution of infection  Plan:  Give vancomycin 1750mg  IV x 1, then start vancomycin 1g IV Q12 Monitor clinical picture, renal function, VT prn F/U C&S, abx deescalation / LOT  Nalda Shackleford J 09/25/2015,6:57 PM

## 2015-09-25 NOTE — ED Provider Notes (Signed)
CSN: [id number]     Arrival date & time 09/25/15  1808 History   First MD Initiated Contact with Patient 09/25/15 1834     Chief Complaint  Patient presents with  . Abscess     (Consider location/radiation/quality/duration/timing/severity/associated sxs/prior Treatment) HPI   Jeffrey [name] is a 49 y.o. male with a medical hx of DM, who presents to the Emergency Department complaining of left sided back abscess onset 1 week. Pt family member notes that the area has increased in size over the past couple of days. States that he had a similar episode last year and was hospitalized at [facility] and had surgery. Pt wife notes that this area looks similar to when he had the surgery performed. Pt wife reports that the pt has felt warm to the touch last night but there was no documented temperature. He states that he is having associated symptoms of drainage. He denies fever, chills, and any other symptoms. Denies allergies to medications.   Past Medical History  Diagnosis Date  . Diabetes mellitus    Past Surgical History  Procedure Laterality Date  . Back surgery     Family History  Problem Relation Age of Onset  . Diabetes Mother   . Hypertension Mother   . Diabetes Sister    Social History  Substance Use Topics  . Smoking status: Never Smoker   . Smokeless tobacco: None  . Alcohol Use: No    Review of Systems  Constitutional: Positive for fever, chills and fatigue.  Respiratory: Negative for shortness of breath.   Cardiovascular: Negative for chest pain.  Gastrointestinal: Negative for nausea, vomiting, diarrhea and constipation.  Genitourinary: Negative for dysuria.  Skin: Positive for wound.  All other systems reviewed and are negative.     Allergies  Review of patient's allergies indicates no known allergies.  Home Medications   Prior to Admission medications   Medication Sig Start Date End Date Taking? Authorizing Provider  clotrimazole (LOTRIMIN) 1 % cream  Apply to affected area 2 times daily 03/11/14   Serita Grit, MD  gabapentin (NEURONTIN) 400 MG capsule Take 1 capsule (400 mg total) by mouth 3 (three) times daily. 06/04/15   Lorayne Marek, MD  HYDROcodone-acetaminophen (NORCO/VICODIN) 5-325 MG per tablet Take 1-2 tablets by mouth every 4 (four) hours as needed for moderate pain or severe pain. Patient not taking: Reported on 03/04/2015 12/18/14   Waynetta Pean, PA-C  hydrocortisone cream 1 % Apply 1 application topically 2 (two) times daily. 11/12/14   Lorayne Marek, MD  ibuprofen (ADVIL,MOTRIN) 600 MG tablet Take 1 tablet (600 mg total) by mouth every 6 (six) hours as needed. 12/05/14   Julianne Rice, MD  insulin aspart (NOVOLOG) 100 UNIT/ML injection INJECT 0.1 MLS (10 UNITS TOTAL) INTO THE SKIN 3 TIMES DAILY WITH MEALS 07/01/15   Lorayne Marek, MD  insulin glargine (LANTUS) 100 UNIT/ML injection Inject 0.32 mLs (32 Units total) into the skin daily. 07/01/15   Lorayne Marek, MD  Insulin Pen Needle 31G X 5 MM MISC Check blood sugar TID & QHS 03/04/15   Lorayne Marek, MD  Insulin Syringe-Needle U-100 (INSULIN SYRINGE 1CC/31GX5/16") 31G X 5/16" 1 ML MISC Check blood sugar TID & QHS 03/04/15   Deepak Advani, MD  lisinopril (PRINIVIL,ZESTRIL) 5 MG tablet Take 1 tablet (5 mg total) by mouth daily. 01/21/15   Lorayne Marek, MD  methocarbamol (ROBAXIN) 500 MG tablet Take 1 tablet (500 mg total) by mouth 2 (two) times daily. 09/09/15   Ottie Glazier,  PA-C  naproxen (NAPROSYN) 500 MG tablet Take 1 tablet (500 mg total) by mouth 2 (two) times daily. 09/09/15   Hanna Patel-Mills, PA-C  sildenafil (VIAGRA) 50 MG tablet Take 0.5-1 tablets (25-50 mg total) by mouth daily as needed for erectile dysfunction. Patient not taking: Reported on 07/01/2015 01/21/15   Lorayne Marek, MD  terbinafine (LAMISIL) 250 MG tablet Take 1 tablet (250 mg total) by mouth daily. 06/04/15   Lorayne Marek, MD   BP 133/64 mmHg  Pulse 90  Temp(Src) 101.6 F (38.7 C) (Oral)  Resp 18  SpO2  100% Physical Exam  Constitutional: He is oriented to person, place, and time. He appears well-developed and well-nourished.  HENT:  Head: Normocephalic and atraumatic.  Eyes: Conjunctivae and EOM are normal. Pupils are equal, round, and reactive to light. Right eye exhibits no discharge. Left eye exhibits no discharge. No scleral icterus.  Neck: Normal range of motion. Neck supple. No JVD present.  Cardiovascular: Normal rate, regular rhythm and normal heart sounds.  Exam reveals no gallop and no friction rub.   No murmur heard. Pulmonary/Chest: Effort normal and breath sounds normal. No respiratory distress. He has no wheezes. He has no rales. He exhibits no tenderness.  Abdominal: Soft. He exhibits no distension and no mass. There is no tenderness. There is no rebound and no guarding.  Musculoskeletal: Normal range of motion. He exhibits no edema or tenderness.  Neurological: He is alert and oriented to person, place, and time.  Skin: Skin is warm and dry.  8-10 cm diameter area of cellulitis with small central pustule on left mid back, moderate induration, no fluctuance  Psychiatric: He has a normal mood and affect. His behavior is normal. Judgment and thought content normal.  Nursing note and vitals reviewed.   ED Course  Procedures (including critical care time) Results for orders placed or performed during the hospital encounter of 09/25/15  Blood culture (routine x 2)  Result Value Ref Range   Specimen Description BLOOD LEFT FOREARM    Special Requests BOTTLES DRAWN AEROBIC AND ANAEROBIC 5MLS    Culture PENDING    Report Status PENDING   CBC with Differential/Platelet  Result Value Ref Range   WBC 9.7 4.0 - 10.5 K/uL   RBC 5.20 4.22 - 5.81 MIL/uL   Hemoglobin 12.7 (L) 13.0 - 17.0 g/dL   HCT 39.4 39.0 - 52.0 %   MCV 75.8 (L) 78.0 - 100.0 fL   MCH 24.4 (L) 26.0 - 34.0 pg   MCHC 32.2 30.0 - 36.0 g/dL   RDW 15.1 11.5 - 15.5 %   Platelets 232 150 - 400 K/uL   Neutrophils  Relative % 77 %   Neutro Abs 7.5 1.7 - 7.7 K/uL   Lymphocytes Relative 13 %   Lymphs Abs 1.2 0.7 - 4.0 K/uL   Monocytes Relative 9 %   Monocytes Absolute 0.9 0.1 - 1.0 K/uL   Eosinophils Relative 0 %   Eosinophils Absolute 0.0 0.0 - 0.7 K/uL   Basophils Relative 1 %   Basophils Absolute 0.1 0.0 - 0.1 K/uL  Basic metabolic panel  Result Value Ref Range   Sodium 133 (L) 135 - 145 mmol/L   Potassium 4.1 3.5 - 5.1 mmol/L   Chloride 98 (L) 101 - 111 mmol/L   CO2 25 22 - 32 mmol/L   Glucose, Bld 371 (H) 65 - 99 mg/dL   BUN 9 6 - 20 mg/dL   Creatinine, Ser 0.79 0.61 - 1.24 mg/dL  Calcium 9.4 8.9 - 10.3 mg/dL   GFR calc non Af Amer >60 >60 mL/min   GFR calc Af Amer >60 >60 mL/min   Anion gap 10 5 - 15  I-Stat CG4 Lactic Acid, ED  Result Value Ref Range   Lactic Acid, Venous 1.52 0.5 - 2.0 mmol/L   No results found.  INCISION AND DRAINAGE Performed by: Montine Circle Consent: Verbal consent obtained. Risks and benefits: risks, benefits and alternatives were discussed Type: abscess  Body area: left mid back  Anesthesia: local infiltration  Incision was made with a scalpel.  Local anesthetic: lidocaine 2% with epinephrine  Anesthetic total: 5 ml  Complexity: complex Blunt dissection to break up loculations  Drainage: purulent  Drainage amount: mild  Packing material: not packed  Patient tolerance: Patient tolerated the procedure well with no immediate complications.     MDM   Final diagnoses:  Cellulitis of back    Patient with diabetes presenting with abscess and cellulitis of his left mid back. Incision and drainage attempted in the emergency department, however there is only minimal drainage after being incised. Most of the surrounding tissue is cellulitic. There is no visible fluid collection on ultrasound. [name] temperature has increased to 101.6. He will be given IV vancomycin. We will give him a liter of fluids because his glucose is also elevated.  Patient will need to be admitted for evaluation of cellulitis and concern of developing sepsis. Patient discussed with Dr. Wyvonnia Dusky, who agrees with the plan.  Appreciate Dr. Daryll Drown for admitting the patient.    Montine Circle, PA-C 09/25/15 2040  Ezequiel Essex, MD 09/26/15 [phone]

## 2015-09-25 NOTE — ED Notes (Signed)
Pt here for abscess to back area with some drainage

## 2015-09-25 NOTE — ED Provider Notes (Deleted)
MSE was initiated and I personally evaluated the patient and placed orders (if any) at  6:47 PM on September 25, 2015.   Jeffrey [name] is a 49 y.o. male with a medical hx of DM, who presents to the Emergency Department complaining of left sided back abscess onset 1 week. Pt family member notes that the area has increased in size over the past couple of days. States that he had a similar episode last year and was hospitalized at [facility] and had surgery. Pt wife notes that this area looks similar to when he had the surgery performed. Pt wife reports that the pt has felt warm to the touch last night but there was no documented temperature. He states that he is having associated symptoms of drainage. He denies fever, chills, and any other symptoms. Denies allergies to medications.   Cellulitis on left mid back with possible abscess.  Patient mildly febrile.  Will move patient to acute side as he may require further workup and possible admission for cellulitis vs abscess in diabetic.  Had similar abscess drained by surgery last year and was admitted to the hospital.  Labs are pending.  The patient appears stable so that the remainder of the MSE may be completed by another provider.  Montine Circle, PA-C 09/25/15 [phone]

## 2015-09-26 ENCOUNTER — Encounter (HOSPITAL_COMMUNITY): Payer: Self-pay | Admitting: Internal Medicine

## 2015-09-26 DIAGNOSIS — E1142 Type 2 diabetes mellitus with diabetic polyneuropathy: Secondary | ICD-10-CM

## 2015-09-26 DIAGNOSIS — I10 Essential (primary) hypertension: Secondary | ICD-10-CM

## 2015-09-26 DIAGNOSIS — L03312 Cellulitis of back [any part except buttock]: Secondary | ICD-10-CM

## 2015-09-26 DIAGNOSIS — Z794 Long term (current) use of insulin: Secondary | ICD-10-CM

## 2015-09-26 LAB — CBC
HEMATOCRIT: 36.5 % — AB (ref 39.0–52.0)
Hemoglobin: 11.5 g/dL — ABNORMAL LOW (ref 13.0–17.0)
MCH: 24.2 pg — AB (ref 26.0–34.0)
MCHC: 31.5 g/dL (ref 30.0–36.0)
MCV: 76.7 fL — AB (ref 78.0–100.0)
Platelets: 215 10*3/uL (ref 150–400)
RBC: 4.76 MIL/uL (ref 4.22–5.81)
RDW: 15.3 % (ref 11.5–15.5)
WBC: 10.2 10*3/uL (ref 4.0–10.5)

## 2015-09-26 LAB — GLUCOSE, CAPILLARY
GLUCOSE-CAPILLARY: 227 mg/dL — AB (ref 65–99)
GLUCOSE-CAPILLARY: 277 mg/dL — AB (ref 65–99)
Glucose-Capillary: 206 mg/dL — ABNORMAL HIGH (ref 65–99)
Glucose-Capillary: 210 mg/dL — ABNORMAL HIGH (ref 65–99)

## 2015-09-26 LAB — BASIC METABOLIC PANEL
Anion gap: 7 (ref 5–15)
BUN: 6 mg/dL (ref 6–20)
CHLORIDE: 103 mmol/L (ref 101–111)
CO2: 25 mmol/L (ref 22–32)
CREATININE: 0.59 mg/dL — AB (ref 0.61–1.24)
Calcium: 8.4 mg/dL — ABNORMAL LOW (ref 8.9–10.3)
GFR calc non Af Amer: >60 mL/min (ref >60)
Glucose, Bld: 235 mg/dL — ABNORMAL HIGH (ref 65–99)
Potassium: 3.5 mmol/L (ref 3.5–5.1)
Sodium: 135 mmol/L (ref 135–145)

## 2015-09-26 MED ORDER — LIDOCAINE-EPINEPHRINE 2 %-1:100000 IJ SOLN
20.0000 mL | Freq: Once | INTRAMUSCULAR | Status: DC
  Filled 2015-09-26: qty 20

## 2015-09-26 MED ORDER — ACETAMINOPHEN 325 MG PO TABS
650.0000 mg | ORAL_TABLET | ORAL | Status: DC | PRN
  Administered 2015-09-26: 650 mg via ORAL
  Filled 2015-09-26: qty 2

## 2015-09-26 MED ORDER — PIPERACILLIN-TAZOBACTAM 3.375 G IVPB
3.3750 g | Freq: Three times a day (TID) | INTRAVENOUS | Status: DC
  Administered 2015-09-26 – 2015-09-29 (×10): 3.375 g via INTRAVENOUS
  Filled 2015-09-26 (×13): qty 50

## 2015-09-26 NOTE — Consult Note (Signed)
Reason for Consult: Back abscess Referring Physician: Zachery Niswander is an 49 y.o. male.  HPI: Camp presented to the hospital complaining of a 7 day history of painful area on his back. He has a history of diabetes mellitus, hypertension, and previous abscesses on his back requiring hospitalization. He underwent a limited incision and drainage in the emergency department. We are asked to see him in consultation by Dr. Daryll Drown to evaluate for further drainage. He is a poor historian.  Past Medical History  Diagnosis Date  . Diabetes mellitus   . HTN (hypertension)     Past Surgical History  Procedure Laterality Date  . Back surgery      for abscess    Family History  Problem Relation Age of Onset  . Diabetes Mother   . Hypertension Mother   . Diabetes Sister     Social History:  reports that he has never smoked. He does not have any smokeless tobacco history on file. He reports that he does not drink alcohol or use illicit drugs.  Allergies: No Known Allergies  Medications:  Scheduled: . docusate sodium  100 mg Oral BID  . enoxaparin (LOVENOX) injection  40 mg Subcutaneous Daily  . gabapentin  400 mg Oral TID  . insulin aspart  0-15 Units Subcutaneous TID WC  . insulin aspart  0-5 Units Subcutaneous QHS  . insulin aspart  10 Units Subcutaneous TID WC  . insulin glargine  15 Units Subcutaneous Daily  . lidocaine-EPINEPHrine  20 mL Intradermal Once  . lisinopril  5 mg Oral Daily  . piperacillin-tazobactam (ZOSYN)  IV  3.375 g Intravenous 3 times per day  . vancomycin  1,000 mg Intravenous Q12H   Continuous:  WIO:[id number], bisacodyl, methocarbamol, morphine injection, ondansetron **OR** ondansetron (ZOFRAN) IV, oxyCODONE  Results for orders placed or performed during the hospital encounter of 09/25/15 (from the past 48 hour(s))  CBC with Differential/Platelet     Status: Abnormal   Collection Time: 09/25/15  7:00 PM  Result Value Ref Range   WBC  9.7 4.0 - 10.5 K/uL   RBC 5.20 4.22 - 5.81 MIL/uL   Hemoglobin 12.7 (L) 13.0 - 17.0 g/dL   HCT 39.4 39.0 - 52.0 %   MCV 75.8 (L) 78.0 - 100.0 fL   MCH 24.4 (L) 26.0 - 34.0 pg   MCHC 32.2 30.0 - 36.0 g/dL   RDW 15.1 11.5 - 15.5 %   Platelets 232 150 - 400 K/uL   Neutrophils Relative % 77 %   Neutro Abs 7.5 1.7 - 7.7 K/uL   Lymphocytes Relative 13 %   Lymphs Abs 1.2 0.7 - 4.0 K/uL   Monocytes Relative 9 %   Monocytes Absolute 0.9 0.1 - 1.0 K/uL   Eosinophils Relative 0 %   Eosinophils Absolute 0.0 0.0 - 0.7 K/uL   Basophils Relative 1 %   Basophils Absolute 0.1 0.0 - 0.1 K/uL  Basic metabolic panel     Status: Abnormal   Collection Time: 09/25/15  7:00 PM  Result Value Ref Range   Sodium 133 (L) 135 - 145 mmol/L   Potassium 4.1 3.5 - 5.1 mmol/L   Chloride 98 (L) 101 - 111 mmol/L   CO2 25 22 - 32 mmol/L   Glucose, Bld 371 (H) 65 - 99 mg/dL   BUN 9 6 - 20 mg/dL   Creatinine, Ser 0.79 0.61 - 1.24 mg/dL   Calcium 9.4 8.9 - 10.3 mg/dL   GFR calc non Af  Amer >60 >60 mL/min   GFR calc Af Amer >60 >60 mL/min    Comment: (NOTE) The eGFR has been calculated using the CKD EPI equation. This calculation has not been validated in all clinical situations. eGFR's persistently <60 mL/min signify possible Chronic Kidney Disease.    Anion gap 10 5 - 15  Blood culture (routine x 2)     Status: None (Preliminary result)   Collection Time: 09/25/15  7:09 PM  Result Value Ref Range   Specimen Description BLOOD LEFT FOREARM    Special Requests BOTTLES DRAWN AEROBIC AND ANAEROBIC 5MLS    Culture NO GROWTH < 24 HOURS    Report Status PENDING   I-Stat CG4 Lactic Acid, ED     Status: None   Collection Time: 09/25/15  7:21 PM  Result Value Ref Range   Lactic Acid, Venous 1.52 0.5 - 2.0 mmol/L  Blood culture (routine x 2)     Status: None (Preliminary result)   Collection Time: 09/25/15  7:43 PM  Result Value Ref Range   Specimen Description BLOOD BLOOD LEFT FOREARM    Special Requests BOTTLES  DRAWN AEROBIC ONLY 4MLS    Culture NO GROWTH < 24 HOURS    Report Status PENDING   I-Stat CG4 Lactic Acid, ED     Status: None   Collection Time: 09/25/15 10:03 PM  Result Value Ref Range   Lactic Acid, Venous 0.63 0.5 - 2.0 mmol/L  Glucose, capillary     Status: Abnormal   Collection Time: 09/25/15 11:17 PM  Result Value Ref Range   Glucose-Capillary 251 (H) 65 - 99 mg/dL  Basic metabolic panel     Status: Abnormal   Collection Time: 09/26/15  5:57 AM  Result Value Ref Range   Sodium 135 135 - 145 mmol/L   Potassium 3.5 3.5 - 5.1 mmol/L   Chloride 103 101 - 111 mmol/L   CO2 25 22 - 32 mmol/L   Glucose, Bld 235 (H) 65 - 99 mg/dL   BUN 6 6 - 20 mg/dL   Creatinine, Ser 0.59 (L) 0.61 - 1.24 mg/dL   Calcium 8.4 (L) 8.9 - 10.3 mg/dL   GFR calc non Af Amer >60 >60 mL/min   GFR calc Af Amer >60 >60 mL/min    Comment: (NOTE) The eGFR has been calculated using the CKD EPI equation. This calculation has not been validated in all clinical situations. eGFR's persistently <60 mL/min signify possible Chronic Kidney Disease.    Anion gap 7 5 - 15  CBC     Status: Abnormal   Collection Time: 09/26/15  5:57 AM  Result Value Ref Range   WBC 10.2 4.0 - 10.5 K/uL   RBC 4.76 4.22 - 5.81 MIL/uL   Hemoglobin 11.5 (L) 13.0 - 17.0 g/dL   HCT 36.5 (L) 39.0 - 52.0 %   MCV 76.7 (L) 78.0 - 100.0 fL   MCH 24.2 (L) 26.0 - 34.0 pg   MCHC 31.5 30.0 - 36.0 g/dL   RDW 15.3 11.5 - 15.5 %   Platelets 215 150 - 400 K/uL  Glucose, capillary     Status: Abnormal   Collection Time: 09/26/15  7:54 AM  Result Value Ref Range   Glucose-Capillary 206 (H) 65 - 99 mg/dL  Glucose, capillary     Status: Abnormal   Collection Time: 09/26/15 12:00 PM  Result Value Ref Range   Glucose-Capillary 227 (H) 65 - 99 mg/dL   Comment 1 Notify RN    Comment  2 Document in Chart     No results found.  Review of Systems  Constitutional: Positive for fever.  HENT: Negative.   Eyes: Negative.   Respiratory: Negative for  cough and shortness of breath.   Cardiovascular: Negative for chest pain.  Gastrointestinal: Negative for nausea, vomiting and abdominal pain.  Genitourinary: Negative.   Musculoskeletal:       Pain at abscess site on his back  Skin: Positive for itching.       See history of present illness  Neurological: Negative.   Endo/Heme/Allergies: Negative.   Psychiatric/Behavioral: Negative.    Blood pressure 132/78, pulse 94, temperature 100.9 F (38.3 C), temperature source Oral, resp. rate 18, height _0  (1.702 m), weight 82.872 kg (182 lb 11.2 oz), SpO2 99 %. Physical Exam  Constitutional: He appears well-developed and well-nourished. No distress.  HENT:  Head: Normocephalic.  Neck: Neck supple. No tracheal deviation present.  Cardiovascular: Normal rate and normal heart sounds.   Respiratory: Effort normal and breath sounds normal. No stridor. No respiratory distress. He has no wheezes.    Left back with 6 mL area of induration with central small wound  GI: Soft. He exhibits no distension. There is no tenderness.  Neurological: He is alert.    Assessment/Plan: Back abscess - we'll proceed with further incision and drainage at the bedside. Procedure, risks, and benefits were discussed with him and he is agreeable.  Karolyne Timmons E 09/26/2015, 3:40 PM

## 2015-09-26 NOTE — Procedures (Signed)
Incision and Drainage Procedure Note  Pre-operative Diagnosis: Back abscess  Post-operative Diagnosis: same  Anesthesia: 2% lidocaine with epinephrine  Procedure Details  The procedure, risks and complications have been discussed in detail (including, but not limited to airway compromise, infection, bleeding) with the patient, and the patient has signed consent to the procedure.  Patient received IV morphine. The skin was sterilely prepped and draped over the affected area in the usual fashion. After adequate local anesthesia, I&D with a #11 blade was performed on the left back. Purulent drainage: present. Wound was thoroughly irrigated. It was packed with quarter-inch iodoform gauze and covered with bulky gauze. The patient was observed until stable.  Findings:  Significant purulent drainage. This was sent for culture.  EBL: 2 cc's  Condition: Tolerated procedure well and Stable   Complications: none   Georganna Skeans, MD, MPH, FACS Trauma: [phone] General Surgery: [phone] .

## 2015-09-26 NOTE — Progress Notes (Signed)
[facility]                                                                              Patient Demographics  Jeffrey [name], is a 49 y.o. male, DOB - 1966/08/14, [id number]  Admit date - 09/25/2015   Admitting Physician Sid Falcon, MD  Outpatient Primary MD for the patient is Lorayne Marek, MD  LOS - 1   Chief Complaint  Patient presents with  . Abscess       Brief HPI   Per Dr. Daryll Drown on 09/25/15 Jeffrey [name] is a 49yo man with PMH of uncontrolled DM2, HTN who presented with pain and warmth to the back. Jeffrey [name] and his wife report that the symptoms started about 1 week ago. He reports that it started as pain and warmth in his back that slowly grew and developed what he thought was an abscess. He also was having fatigue, sweating and likely fever at home, though it was not checked. He also reports drainage at the area. The pain is severe to the point that he cannot lie on his back. He has had a similar presentation about a year ago which required surgical drainage and admission to [facility]. They reported that he did not want to get that sick again, so he came to the hospital. He has a history of furuncles which pop on their own per family. He further has a history of DM which is not well controlled. He sometimes misses his insulin doses.  In the ED, attempted lancing of the most fluctuant part of the wound was unsuccessful, no drainage. He had a LA checked which was normal. His Blood glucose was elevated to the 300s, normal Bicarb. Blood cultures were sent. In the ED, he was also febrile to 101.53F.    Assessment & Plan    Principal Problem:   Cellulitis and superficial abscess (?) of the back - Continue IV vancomycin, added Zosyn due to uncontrolled diabetes - Patient had attempted I&D in the ED, called for surgery consult  Active Problems:   HTN (hypertension) - Currently stable, continue lisinopril    Diabetic  neuropathy (HCC) - Stable    DM (diabetes mellitus) ([place]) uncontrolled Obtain hemoglobin A1c - Placed on Lantus, NovoLog meal coverage, sliding scale insulin   Code Status: Full code  Family Communication: Discussed in detail with the patient, all imaging results, lab results explained to the patient    Disposition Plan: DC in 24-48 hours if improving  Time Spent in minutes   25 minutes  Procedures  None  Consults   Gen. surgery  DVT Prophylaxis Lovenox  Medications  Scheduled Meds: . docusate sodium  100 mg Oral BID  . enoxaparin (LOVENOX) injection  40 mg Subcutaneous Daily  . gabapentin  400 mg Oral TID  . insulin aspart  0-15 Units Subcutaneous TID WC  . insulin aspart  0-5 Units Subcutaneous QHS  . insulin aspart  10 Units Subcutaneous TID WC  . insulin glargine  15 Units Subcutaneous Daily  . lidocaine-EPINEPHrine  20 mL Intradermal Once  . lisinopril  5 mg Oral Daily  . piperacillin-tazobactam (ZOSYN)  IV  3.375 g Intravenous 3 times per day  . vancomycin  1,000 mg Intravenous Q12H   Continuous Infusions:  PRN Meds:.acetaminophen, bisacodyl, methocarbamol, morphine injection, ondansetron **OR** ondansetron (ZOFRAN) IV, oxyCODONE   Antibiotics   Anti-infectives    Start     Dose/Rate Route Frequency Ordered Stop   09/26/15 1000  terbinafine (LAMISIL) tablet 250 mg  Status:  Discontinued     250 mg Oral Daily 09/25/15 2300 09/25/15 2302   09/26/15 0930  piperacillin-tazobactam (ZOSYN) IVPB 3.375 g     3.375 g 12.5 mL/hr over 240 Minutes Intravenous 3 times per day 09/26/15 0827     09/26/15 0700  vancomycin (VANCOCIN) IVPB 1000 mg/200 mL premix     1,000 mg 200 mL/hr over 60 Minutes Intravenous Every 12 hours 09/25/15 2008     09/25/15 1900  vancomycin (VANCOCIN) 1,750 mg in sodium chloride 0.9 % 500 mL IVPB     1,750 mg 250 mL/hr over 120 Minutes Intravenous  Once 09/25/15 1856 09/25/15 2235        Subjective:   Jeffrey [name] was seen and  examined today. Still having pain in the back, indurated area with erythema. Low-grade temp. Patient denies dizziness, chest pain, shortness of breath, abdominal pain, N/V/D/C, new weakness, numbess, tingling. No acute events overnight.    Objective:   Blood pressure 115/69, pulse 88, temperature 99.6 F (37.6 C), temperature source Oral, resp. rate 18, height 5\' 7"  (1.702 m), weight 82.872 kg (182 lb 11.2 oz), SpO2 98 %.  Wt Readings from Last 3 Encounters:  09/25/15 82.872 kg (182 lb 11.2 oz)  09/09/15 84.823 kg (187 lb)  07/01/15 83.371 kg (183 lb 12.8 oz)     Intake/Output Summary (Last 24 hours) at 09/26/15 1246 Last data filed at 09/26/15 1013  Gross per 24 hour  Intake    120 ml  Output      0 ml  Net    120 ml    Exam  General: Alert and oriented x 3, NAD  HEENT:  PERRLA, EOMI, Anicteric Sclera, mucous membranes moist.   Neck: Supple, no JVD, no masses  CVS: S1 S2 auscultated, no rubs, murmurs or gallops. Regular rate and rhythm.  Respiratory: Clear to auscultation bilaterally, no wheezing, rales or rhonchi  Abdomen: Soft, nontender, nondistended, + bowel sounds  Ext: no cyanosis clubbing or edema  Neuro: AAOx3, Cr N's II- XII. Strength 5/5 upper and lower extremities bilaterally  Skin: On the back, erythema and warmth over the left mid back, tender to palpation, indurated area ~8cm  Psych: Normal affect and demeanor, alert and oriented x3    Data Review   Micro Results Recent Results (from the past 240 hour(s))  Blood culture (routine x 2)     Status: None (Preliminary result)   Collection Time: 09/25/15  7:09 PM  Result Value Ref Range Status   Specimen Description BLOOD LEFT FOREARM  Final   Special Requests BOTTLES DRAWN AEROBIC AND ANAEROBIC 5MLS  Final   Culture PENDING  Incomplete   Report Status PENDING  Incomplete    Radiology Reports No results found.  CBC  Recent Labs Lab 09/25/15 1900 09/26/15 0557  WBC 9.7 10.2  HGB 12.7* 11.5*    HCT 39.4 36.5*  PLT 232 215  MCV 75.8* 76.7*  MCH 24.4* 24.2*  MCHC 32.2 31.5  RDW 15.1 15.3  LYMPHSABS 1.2  --   MONOABS 0.9  --   EOSABS 0.0  --   BASOSABS 0.1  --  Chemistries   Recent Labs Lab 09/25/15 1900 09/26/15 0557  NA 133* 135  K 4.1 3.5  CL 98* 103  CO2 25 25  GLUCOSE 371* 235*  BUN 9 6  CREATININE 0.79 0.59*  CALCIUM 9.4 8.4*   ------------------------------------------------------------------------------------------------------------------ estimated creatinine clearance is 113.8 mL/min (by C-G formula based on Cr of 0.59). ------------------------------------------------------------------------------------------------------------------ No results for input(s): HGBA1C in the last 72 hours. ------------------------------------------------------------------------------------------------------------------ No results for input(s): CHOL, HDL, LDLCALC, TRIG, CHOLHDL, LDLDIRECT in the last 72 hours. ------------------------------------------------------------------------------------------------------------------ No results for input(s): TSH, T4TOTAL, T3FREE, THYROIDAB in the last 72 hours.  Invalid input(s): FREET3 ------------------------------------------------------------------------------------------------------------------ No results for input(s): VITAMINB12, FOLATE, FERRITIN, TIBC, IRON, RETICCTPCT in the last 72 hours.  Coagulation profile No results for input(s): INR, PROTIME in the last 168 hours.  No results for input(s): DDIMER in the last 72 hours.  Cardiac Enzymes No results for input(s): CKMB, TROPONINI, MYOGLOBIN in the last 168 hours.  Invalid input(s): CK ------------------------------------------------------------------------------------------------------------------ Invalid input(s): [place]  09/25/15 2317 09/26/15 0754 09/26/15 1200  GLUCAP 251* 206* 227*     Jeffrey [name] M.D. [facility] 09/26/2015,  12:46 PM  Pager: [phone] Between 7am to 7pm - call Pager - [phone]  After 7pm go to [email or web address] - password [email or web address]  Call night coverage person covering after 7pm

## 2015-09-26 NOTE — Progress Notes (Signed)
ANTIBIOTIC CONSULT NOTE - FOLLOW UP  Pharmacy Consult for Vancomycin/Zosyn Indication: cellulitis/wound infection of the back  No Known Allergies  Patient Measurements: Height: 5\' 7"  (170.2 cm) Weight: 182 lb 11.2 oz (82.872 kg) IBW/kg (Calculated) : 66.1  Vital Signs: Temp: 99.6 F (37.6 C) (10/15 0628) Temp Source: Oral (10/15 0628) BP: 115/69 mmHg (10/15 0628) Pulse Rate: 88 (10/15 0628) Intake/Output from previous day:   Intake/Output from this shift:    Labs:  Recent Labs  09/25/15 1900 09/26/15 0557  WBC 9.7 10.2  HGB 12.7* 11.5*  PLT 232 215  CREATININE 0.79 0.59*   Estimated Creatinine Clearance: 113.8 mL/min (by C-G formula based on Cr of 0.59). No results for input(s): VANCOTROUGH, VANCOPEAK, VANCORANDOM, GENTTROUGH, GENTPEAK, GENTRANDOM, TOBRATROUGH, TOBRAPEAK, TOBRARND, AMIKACINPEAK, AMIKACINTROU, AMIKACIN in the last 72 hours.   Microbiology: Recent Results (from the past 720 hour(s))  Blood culture (routine x 2)     Status: None (Preliminary result)   Collection Time: 09/25/15  7:09 PM  Result Value Ref Range Status   Specimen Description BLOOD LEFT FOREARM  Final   Special Requests BOTTLES DRAWN AEROBIC AND ANAEROBIC 5MLS  Final   Culture PENDING  Incomplete   Report Status PENDING  Incomplete    Anti-infectives    Start     Dose/Rate Route Frequency Ordered Stop   09/26/15 1000  terbinafine (LAMISIL) tablet 250 mg  Status:  Discontinued     250 mg Oral Daily 09/25/15 2300 09/25/15 2302   09/26/15 0700  vancomycin (VANCOCIN) IVPB 1000 mg/200 mL premix     1,000 mg 200 mL/hr over 60 Minutes Intravenous Every 12 hours 09/25/15 2008     09/25/15 1900  vancomycin (VANCOCIN) 1,750 mg in sodium chloride 0.9 % 500 mL IVPB     1,750 mg 250 mL/hr over 120 Minutes Intravenous  Once 09/25/15 1856 09/25/15 2235      Assessment: 49yoM admitted 09/25/2015 with pain and warmth in his back that slowly progressed and developed into what he thought was an  abscess with some drainage. History of similar presentation at [facility] ~1 year ago. Pharmacy consulted for vancomycin/zosyn.  Day #2 vancomycin. Blood cultures and abscess culture collected but are still pending. Tmax 101.6, SCr 0.59, WBC 10.2. Will add Zosyn today.  Goal of Therapy:  Vancomycin trough level 10-15 mcg/ml  Plan:  - Continue vancomycin 1g IV q12h - Start Zosyn 3.375g IV q8h - Follow up C&S, vital signs, and clinical progression - Monitor SCr and vanc trough at SS prn  Dimitri Ped, PharmD. Clinical Pharmacist Resident Pager: [phone]

## 2015-09-27 LAB — CBC
HEMATOCRIT: 35.1 % — AB (ref 39.0–52.0)
Hemoglobin: 10.8 g/dL — ABNORMAL LOW (ref 13.0–17.0)
MCH: 23.6 pg — AB (ref 26.0–34.0)
MCHC: 30.8 g/dL (ref 30.0–36.0)
MCV: 76.6 fL — AB (ref 78.0–100.0)
Platelets: 241 10*3/uL (ref 150–400)
RBC: 4.58 MIL/uL (ref 4.22–5.81)
RDW: 15.3 % (ref 11.5–15.5)
WBC: 10.4 10*3/uL (ref 4.0–10.5)

## 2015-09-27 LAB — GLUCOSE, CAPILLARY
GLUCOSE-CAPILLARY: 242 mg/dL — AB (ref 65–99)
GLUCOSE-CAPILLARY: 105 mg/dL — AB (ref 65–99)
Glucose-Capillary: 216 mg/dL — ABNORMAL HIGH (ref 65–99)
Glucose-Capillary: 167 mg/dL — ABNORMAL HIGH (ref 65–99)

## 2015-09-27 LAB — BASIC METABOLIC PANEL
Anion gap: 9 (ref 5–15)
BUN: 12 mg/dL (ref 6–20)
CHLORIDE: 99 mmol/L — AB (ref 101–111)
CO2: 24 mmol/L (ref 22–32)
Calcium: 8.5 mg/dL — ABNORMAL LOW (ref 8.9–10.3)
Creatinine, Ser: 0.9 mg/dL (ref 0.61–1.24)
GFR calc Af Amer: >60 mL/min (ref >60)
GFR calc non Af Amer: >60 mL/min (ref >60)
Glucose, Bld: 227 mg/dL — ABNORMAL HIGH (ref 65–99)
POTASSIUM: 3.5 mmol/L (ref 3.5–5.1)
SODIUM: 132 mmol/L — AB (ref 135–145)

## 2015-09-27 MED ORDER — ACETAMINOPHEN 500 MG PO TABS
1000.0000 mg | ORAL_TABLET | Freq: Three times a day (TID) | ORAL | Status: DC
  Administered 2015-09-27 – 2015-09-29 (×7): 1000 mg via ORAL
  Filled 2015-09-27 (×7): qty 2

## 2015-09-27 MED ORDER — INSULIN GLARGINE 100 UNIT/ML ~~LOC~~ SOLN
10.0000 [IU] | Freq: Once | SUBCUTANEOUS | Status: AC
  Administered 2015-09-27: 10 [IU] via SUBCUTANEOUS
  Filled 2015-09-27: qty 0.1

## 2015-09-27 MED ORDER — INSULIN GLARGINE 100 UNIT/ML ~~LOC~~ SOLN
25.0000 [IU] | Freq: Every day | SUBCUTANEOUS | Status: DC
  Administered 2015-09-28 – 2015-09-29 (×2): 25 [IU] via SUBCUTANEOUS
  Filled 2015-09-27 (×2): qty 0.25

## 2015-09-27 MED ORDER — OXYCODONE HCL 5 MG PO TABS
5.0000 mg | ORAL_TABLET | ORAL | Status: DC | PRN
  Administered 2015-09-27: 5 mg via ORAL
  Administered 2015-09-28 – 2015-09-29 (×2): 10 mg via ORAL
  Filled 2015-09-27: qty 2
  Filled 2015-09-27: qty 1
  Filled 2015-09-27: qty 2

## 2015-09-27 MED ORDER — LIVING WELL WITH DIABETES BOOK
Freq: Once | Status: AC
  Administered 2015-09-27: 14:00:00
  Filled 2015-09-27: qty 1

## 2015-09-27 NOTE — Progress Notes (Signed)
Utilization Review Completed.Jeffrey [name] T10/16/2016  

## 2015-09-27 NOTE — Progress Notes (Signed)
Jeffrey [name]., [place], [place] [address] Phone: [phone] FAX: [place] [phone] 12/18/1965   Problem List:   Principal Problem:   Cellulitis Active Problems:   HTN (hypertension)   Diabetic neuropathy ([place])   DM (diabetes mellitus) ([place])   Cellulitis of back      *I&D back asbcess 09/26/2015   Assessment  Improving  Plan:  -IV ABX -dressing changes BIID -DM control - HgbA1C was > 10 last time.  Long d/w patient about better care.  Will try & enlist support -VTE prophylaxis- SCDs, etc -mobilize as tolerated to help recovery  D/C patient from hospital when patient meets criteria (anticipate in 1-3 day(s)):  Tolerating oral intake well Ambulating well Adequate pain control without IV medications Urinating  Having flatus Dressing changes setup DM control better Disposition planning in place   Jeffrey [name], M.D., F.A.C.S. Gastrointestinal and Minimally Invasive Surgery [facility] [address], [place], [place] [address] [phone] Main / Paging   09/27/2015  Subjective:  Pain controlled Staying in bed  Objective:  Vital signs:  Filed Vitals:   09/26/15 0628 09/26/15 1337 09/26/15 2212 09/27/15 0538  BP: 115/69 132/78 130/72 130/77  Pulse: 88 94 105 88  Temp: 99.6 F (37.6 C) 100.9 F (38.3 C) 102.9 F (39.4 C) 99.1 F (37.3 C)  TempSrc: Oral Oral Oral Oral  Resp: _0 Height:      Weight:      SpO2: 98% 99% 94% 97%    Last BM Date: 09/25/15  Intake/Output   Yesterday:  10/15 0701 - 10/16 0700 In: 360 [P.O.:360] Out: -  This shift:     Bowel function:  Flatus: y  BM: n  Drain: n/a  Physical Exam:  General: Pt awake/alert/oriented x4 in no acute distress Eyes: PERRL, normal EOM.  Sclera clear.  No icterus Neuro: CN II-XII intact w/o focal sensory/motor deficits. Lymph: No head/neck/groin  lymphadenopathy Psych:  No delerium/psychosis/paranoia HENT: Normocephalic, Mucus membranes moist.  No thrush Neck: Supple, No tracheal deviation Chest: No chest wall pain w good excursion CV:  Pulses intact.  Regular rhythm MS: Normal AROM mjr joints.  No obvious deformity Abdomen: Soft.  Nondistended.  Nontender  No evidence of peritonitis.  No incarcerated hernias. Ext:  SCDs BLE.  No mjr edema.  No cyanosis Skin: No petechiae / purpura.  L back 3cm long wound w 8x6cm induration & NU Gauze packing- old bloody drainage  Results:   Labs: Results for orders placed or performed during the hospital encounter of 09/25/15 (from the past 48 hour(s))  CBC with Differential/Platelet     Status: Abnormal   Collection Time: 09/25/15  7:00 PM  Result Value Ref Range   WBC 9.7 4.0 - 10.5 K/uL   RBC 5.20 4.22 - 5.81 MIL/uL   Hemoglobin 12.7 (L) 13.0 - 17.0 g/dL   HCT 39.4 39.0 - 52.0 %   MCV 75.8 (L) 78.0 - 100.0 fL   MCH 24.4 (L) 26.0 - 34.0 pg   MCHC 32.2 30.0 - 36.0 g/dL   RDW 15.1 11.5 - 15.5 %   Platelets 232 150 - 400 K/uL   Neutrophils Relative % 77 %   Neutro Abs 7.5 1.7 - 7.7 K/uL   Lymphocytes Relative 13 %   Lymphs Abs 1.2 0.7 - 4.0 K/uL   Monocytes Relative 9 %   Monocytes Absolute 0.9 0.1 -  1.0 K/uL   Eosinophils Relative 0 %   Eosinophils Absolute 0.0 0.0 - 0.7 K/uL   Basophils Relative 1 %   Basophils Absolute 0.1 0.0 - 0.1 K/uL  Basic metabolic panel     Status: Abnormal   Collection Time: 09/25/15  7:00 PM  Result Value Ref Range   Sodium 133 (L) 135 - 145 mmol/L   Potassium 4.1 3.5 - 5.1 mmol/L   Chloride 98 (L) 101 - 111 mmol/L   CO2 25 22 - 32 mmol/L   Glucose, Bld 371 (H) 65 - 99 mg/dL   BUN 9 6 - 20 mg/dL   Creatinine, Ser 0.79 0.61 - 1.24 mg/dL   Calcium 9.4 8.9 - 10.3 mg/dL   GFR calc non Af Amer >60 >60 mL/min   GFR calc Af Amer >60 >60 mL/min    Comment: (NOTE) The eGFR has been calculated using the CKD EPI equation. This calculation has not been  validated in all clinical situations. eGFR's persistently <60 mL/min signify possible Chronic Kidney Disease.    Anion gap 10 5 - 15  Blood culture (routine x 2)     Status: None (Preliminary result)   Collection Time: 09/25/15  7:09 PM  Result Value Ref Range   Specimen Description BLOOD LEFT FOREARM    Special Requests BOTTLES DRAWN AEROBIC AND ANAEROBIC 5MLS    Culture NO GROWTH < 24 HOURS    Report Status PENDING   I-Stat CG4 Lactic Acid, ED     Status: None   Collection Time: 09/25/15  7:21 PM  Result Value Ref Range   Lactic Acid, Venous 1.52 0.5 - 2.0 mmol/L  Culture, routine-abscess     Status: None (Preliminary result)   Collection Time: 09/25/15  7:30 PM  Result Value Ref Range   Specimen Description ABSCESS BACK    Special Requests MID BACK    Gram Stain      NO WBC SEEN NO SQUAMOUS EPITHELIAL CELLS SEEN NO ORGANISMS SEEN Performed at [facility]    Culture      MODERATE STAPHYLOCOCCUS AUREUS Note: RIFAMPIN AND GENTAMICIN SHOULD NOT BE USED AS SINGLE DRUGS FOR TREATMENT OF STAPH INFECTIONS. Performed at [facility]    Report Status PENDING   Blood culture (routine x 2)     Status: None (Preliminary result)   Collection Time: 09/25/15  7:43 PM  Result Value Ref Range   Specimen Description BLOOD BLOOD LEFT FOREARM    Special Requests BOTTLES DRAWN AEROBIC ONLY 4MLS    Culture NO GROWTH < 24 HOURS    Report Status PENDING   I-Stat CG4 Lactic Acid, ED     Status: None   Collection Time: 09/25/15 10:03 PM  Result Value Ref Range   Lactic Acid, Venous 0.63 0.5 - 2.0 mmol/L  Glucose, capillary     Status: Abnormal   Collection Time: 09/25/15 11:17 PM  Result Value Ref Range   Glucose-Capillary 251 (H) 65 - 99 mg/dL  Basic metabolic panel     Status: Abnormal   Collection Time: 09/26/15  5:57 AM  Result Value Ref Range   Sodium 135 135 - 145 mmol/L   Potassium 3.5 3.5 - 5.1 mmol/L   Chloride 103 101 - 111 mmol/L   CO2 25 22 - 32 mmol/L    Glucose, Bld 235 (H) 65 - 99 mg/dL   BUN 6 6 - 20 mg/dL   Creatinine, Ser 0.59 (L) 0.61 - 1.24 mg/dL   Calcium 8.4 (L)  8.9 - 10.3 mg/dL   GFR calc non Af Amer >60 >60 mL/min   GFR calc Af Amer >60 >60 mL/min    Comment: (NOTE) The eGFR has been calculated using the CKD EPI equation. This calculation has not been validated in all clinical situations. eGFR's persistently <60 mL/min signify possible Chronic Kidney Disease.    Anion gap 7 5 - 15  CBC     Status: Abnormal   Collection Time: 09/26/15  5:57 AM  Result Value Ref Range   WBC 10.2 4.0 - 10.5 K/uL   RBC 4.76 4.22 - 5.81 MIL/uL   Hemoglobin 11.5 (L) 13.0 - 17.0 g/dL   HCT 36.5 (L) 39.0 - 52.0 %   MCV 76.7 (L) 78.0 - 100.0 fL   MCH 24.2 (L) 26.0 - 34.0 pg   MCHC 31.5 30.0 - 36.0 g/dL   RDW 15.3 11.5 - 15.5 %   Platelets 215 150 - 400 K/uL  Glucose, capillary     Status: Abnormal   Collection Time: 09/26/15  7:54 AM  Result Value Ref Range   Glucose-Capillary 206 (H) 65 - 99 mg/dL  Glucose, capillary     Status: Abnormal   Collection Time: 09/26/15 12:00 PM  Result Value Ref Range   Glucose-Capillary 227 (H) 65 - 99 mg/dL   Comment 1 Notify RN    Comment 2 Document in Chart   Glucose, capillary     Status: Abnormal   Collection Time: 09/26/15  5:20 PM  Result Value Ref Range   Glucose-Capillary 277 (H) 65 - 99 mg/dL   Comment 1 Notify RN    Comment 2 Document in Chart   Glucose, capillary     Status: Abnormal   Collection Time: 09/26/15  9:13 PM  Result Value Ref Range   Glucose-Capillary 210 (H) 65 - 99 mg/dL  Basic metabolic panel     Status: Abnormal   Collection Time: 09/27/15  5:28 AM  Result Value Ref Range   Sodium 132 (L) 135 - 145 mmol/L   Potassium 3.5 3.5 - 5.1 mmol/L   Chloride 99 (L) 101 - 111 mmol/L   CO2 24 22 - 32 mmol/L   Glucose, Bld 227 (H) 65 - 99 mg/dL   BUN 12 6 - 20 mg/dL   Creatinine, Ser 0.90 0.61 - 1.24 mg/dL   Calcium 8.5 (L) 8.9 - 10.3 mg/dL   GFR calc non Af Amer >60 >60 mL/min    GFR calc Af Amer >60 >60 mL/min    Comment: (NOTE) The eGFR has been calculated using the CKD EPI equation. This calculation has not been validated in all clinical situations. eGFR's persistently <60 mL/min signify possible Chronic Kidney Disease.    Anion gap 9 5 - 15  CBC     Status: Abnormal   Collection Time: 09/27/15  5:28 AM  Result Value Ref Range   WBC 10.4 4.0 - 10.5 K/uL   RBC 4.58 4.22 - 5.81 MIL/uL   Hemoglobin 10.8 (L) 13.0 - 17.0 g/dL   HCT 35.1 (L) 39.0 - 52.0 %   MCV 76.6 (L) 78.0 - 100.0 fL   MCH 23.6 (L) 26.0 - 34.0 pg   MCHC 30.8 30.0 - 36.0 g/dL   RDW 15.3 11.5 - 15.5 %   Platelets 241 150 - 400 K/uL  Glucose, capillary     Status: Abnormal   Collection Time: 09/27/15  7:58 AM  Result Value Ref Range   Glucose-Capillary 216 (H) 65 - 99 mg/dL  Imaging / Studies: No results found.  Medications / Allergies: per chart  Antibiotics: Anti-infectives    Start     Dose/Rate Route Frequency Ordered Stop   09/26/15 1000  terbinafine (LAMISIL) tablet 250 mg  Status:  Discontinued     250 mg Oral Daily 09/25/15 2300 09/25/15 2302   09/26/15 0930  piperacillin-tazobactam (ZOSYN) IVPB 3.375 g     3.375 g 12.5 mL/hr over 240 Minutes Intravenous 3 times per day 09/26/15 0827     09/26/15 0700  vancomycin (VANCOCIN) IVPB 1000 mg/200 mL premix     1,000 mg 200 mL/hr over 60 Minutes Intravenous Every 12 hours 09/25/15 2008     09/25/15 1900  vancomycin (VANCOCIN) 1,750 mg in sodium chloride 0.9 % 500 mL IVPB     1,750 mg 250 mL/hr over 120 Minutes Intravenous  Once 09/25/15 1856 09/25/15 2235        Note: Portions of this report may have been transcribed using voice recognition software. Every effort was made to ensure accuracy; however, inadvertent computerized transcription errors may be present.   Any transcriptional errors that result from this process are unintentional.     Jeffrey [name], M.D., F.A.C.S. Gastrointestinal and Minimally Invasive  Surgery [facility] [address] [place], [place] [address] [phone] Main / Paging   09/27/2015  CARE TEAM:  PCP: Lorayne Marek, MD  Outpatient Care Team: Patient Care Team: Lorayne Marek, MD as PCP - General (Internal Medicine)  Inpatient Treatment Team: Treatment Team: Attending Provider: Mendel Corning, MD; Rounding Team: Sonda Primes, MD; Consulting Physician: Nolon Nations, MD

## 2015-09-27 NOTE — Progress Notes (Signed)
[facility]                                                                              Patient Demographics  Jeffrey [name], is a 49 y.o. male, DOB - 1965-12-29, [id number]  Admit date - 09/25/2015   Admitting Physician Sid Falcon, MD  Outpatient Primary MD for the patient is Lorayne Marek, MD  LOS - 2   Chief Complaint  Patient presents with  . Abscess       Brief HPI   Per Dr. Daryll Drown on 09/25/15 Jeffrey [name] is a 49yo man with PMH of uncontrolled DM2, HTN who presented with pain and warmth to the back. Jeffrey [name] and his wife report that the symptoms started about 1 week ago. He reports that it started as pain and warmth in his back that slowly grew and developed what he thought was an abscess. He also was having fatigue, sweating and likely fever at home, though it was not checked. He also reports drainage at the area. The pain is severe to the point that he cannot lie on his back. He has had a similar presentation about a year ago which required surgical drainage and admission to [facility]. They reported that he did not want to get that sick again, so he came to the hospital. He has a history of furuncles which pop on their own per family. He further has a history of DM which is not well controlled. He sometimes misses his insulin doses.  In the ED, attempted lancing of the most fluctuant part of the wound was unsuccessful, no drainage. He had a LA checked which was normal. His Blood glucose was elevated to the 300s, normal Bicarb. Blood cultures were sent. In the ED, he was also febrile to 101.59F.    Assessment & Plan    Principal Problem:   Cellulitis and abscess of the back - Continue IV vancomycin and Zosyn - Follow wound cultures - I&D done at the bedside by surgery, wound packing done, dressing changes twice a day  Active Problems:   HTN (hypertension) - Currently stable, continue lisinopril    Diabetic neuropathy  (HCC) - Stable    DM (diabetes mellitus) ([place]) uncontrolled - Obtain hemoglobin A1c - Increase Lantus to 25 units, continue NovoLog meal coverage, sliding scale insulin   Code Status: Full code  Family Communication: Discussed in detail with the patient, all imaging results, lab results explained to the patient    Disposition Plan: DC in 24-48 hours when cleared by surgery,  Time Spent in minutes   25 minutes  Procedures  None  Consults   Gen. surgery  DVT Prophylaxis Lovenox  Medications  Scheduled Meds: . acetaminophen  1,000 mg Oral TID  . docusate sodium  100 mg Oral BID  . enoxaparin (LOVENOX) injection  40 mg Subcutaneous Daily  . gabapentin  400 mg Oral TID  . insulin aspart  0-15 Units Subcutaneous TID WC  . insulin aspart  0-5 Units Subcutaneous QHS  . insulin aspart  10 Units Subcutaneous TID WC  . insulin glargine  10 Units Subcutaneous Once  . [START ON  09/28/2015] insulin glargine  25 Units Subcutaneous Daily  . lidocaine-EPINEPHrine  20 mL Intradermal Once  . lisinopril  5 mg Oral Daily  . living well with diabetes book   Does not apply Once  . piperacillin-tazobactam (ZOSYN)  IV  3.375 g Intravenous 3 times per day  . vancomycin  1,000 mg Intravenous Q12H   Continuous Infusions:  PRN Meds:.bisacodyl, methocarbamol, morphine injection, ondansetron **OR** ondansetron (ZOFRAN) IV, oxyCODONE   Antibiotics   Anti-infectives    Start     Dose/Rate Route Frequency Ordered Stop   09/26/15 1000  terbinafine (LAMISIL) tablet 250 mg  Status:  Discontinued     250 mg Oral Daily 09/25/15 2300 09/25/15 2302   09/26/15 0930  piperacillin-tazobactam (ZOSYN) IVPB 3.375 g     3.375 g 12.5 mL/hr over 240 Minutes Intravenous 3 times per day 09/26/15 0827     09/26/15 0700  vancomycin (VANCOCIN) IVPB 1000 mg/200 mL premix     1,000 mg 200 mL/hr over 60 Minutes Intravenous Every 12 hours 09/25/15 2008     09/25/15 1900  vancomycin (VANCOCIN) 1,750 mg in sodium  chloride 0.9 % 500 mL IVPB     1,750 mg 250 mL/hr over 120 Minutes Intravenous  Once 09/25/15 1856 09/25/15 2235        Subjective:   Jeffrey [name] was seen and examined today. Feels somewhat better today after I&D done yesterday. Low-grade temp of 99.1, back pain controlled. Dressing intact. Patient denies dizziness, chest pain, shortness of breath, abdominal pain, N/V/D/C, new weakness, numbess, tingling. No acute events overnight.    Objective:   Blood pressure 130/77, pulse 88, temperature 99.1 F (37.3 C), temperature source Oral, resp. rate 20, height 5\' 7"  (1.702 m), weight 82.872 kg (182 lb 11.2 oz), SpO2 97 %.  Wt Readings from Last 3 Encounters:  09/25/15 82.872 kg (182 lb 11.2 oz)  09/09/15 84.823 kg (187 lb)  07/01/15 83.371 kg (183 lb 12.8 oz)     Intake/Output Summary (Last 24 hours) at 09/27/15 1056 Last data filed at 09/27/15 1036  Gross per 24 hour  Intake    480 ml  Output      0 ml  Net    480 ml    Exam  General: Alert and oriented x 3, NAD  HEENT:  PERRLA, EOMI, Anicteric Sclera, mucous membranes moist.   Neck: Supple, no JVD, no masses  CVS: S1 S2 auscultated, no rubs, murmurs or gallops. Regular rate and rhythm.  Respiratory: Clear to auscultation bilaterally, no wheezing, rales or rhonchi  Abdomen: Soft, nontender, nondistended, + bowel sounds  Ext: no cyanosis clubbing or edema  Neuro: no new deficits  Skin: Dressing intact  Psych: Normal affect and demeanor, alert and oriented x3    Data Review   Micro Results Recent Results (from the past 240 hour(s))  Blood culture (routine x 2)     Status: None (Preliminary result)   Collection Time: 09/25/15  7:09 PM  Result Value Ref Range Status   Specimen Description BLOOD LEFT FOREARM  Final   Special Requests BOTTLES DRAWN AEROBIC AND ANAEROBIC 5MLS  Final   Culture NO GROWTH < 24 HOURS  Final   Report Status PENDING  Incomplete  Culture, routine-abscess     Status: None  (Preliminary result)   Collection Time: 09/25/15  7:30 PM  Result Value Ref Range Status   Specimen Description ABSCESS BACK  Final   Special Requests MID BACK  Final   Gram Stain  Final    NO WBC SEEN NO SQUAMOUS EPITHELIAL CELLS SEEN NO ORGANISMS SEEN Performed at [facility]    Culture   Final    MODERATE STAPHYLOCOCCUS AUREUS Note: RIFAMPIN AND GENTAMICIN SHOULD NOT BE USED AS SINGLE DRUGS FOR TREATMENT OF STAPH INFECTIONS. Performed at [facility]    Report Status PENDING  Incomplete  Blood culture (routine x 2)     Status: None (Preliminary result)   Collection Time: 09/25/15  7:43 PM  Result Value Ref Range Status   Specimen Description BLOOD BLOOD LEFT FOREARM  Final   Special Requests BOTTLES DRAWN AEROBIC ONLY 4MLS  Final   Culture NO GROWTH < 24 HOURS  Final   Report Status PENDING  Incomplete    Radiology Reports No results found.  CBC  Recent Labs Lab 09/25/15 1900 09/26/15 0557 09/27/15 0528  WBC 9.7 10.2 10.4  HGB 12.7* 11.5* 10.8*  HCT 39.4 36.5* 35.1*  PLT 232 215 241  MCV 75.8* 76.7* 76.6*  MCH 24.4* 24.2* 23.6*  MCHC 32.2 31.5 30.8  RDW 15.1 15.3 15.3  LYMPHSABS 1.2  --   --   MONOABS 0.9  --   --   EOSABS 0.0  --   --   BASOSABS 0.1  --   --     Chemistries   Recent Labs Lab 09/25/15 1900 09/26/15 0557 09/27/15 0528  NA 133* 135 132*  K 4.1 3.5 3.5  CL 98* 103 99*  CO2 25 25 24   GLUCOSE 371* 235* 227*  BUN 9 6 12   CREATININE 0.79 0.59* 0.90  CALCIUM 9.4 8.4* 8.5*   ------------------------------------------------------------------------------------------------------------------ estimated creatinine clearance is 101.1 mL/min (by C-G formula based on Cr of 0.9). ------------------------------------------------------------------------------------------------------------------ No results for input(s): HGBA1C in the last 72  hours. ------------------------------------------------------------------------------------------------------------------ No results for input(s): CHOL, HDL, LDLCALC, TRIG, CHOLHDL, LDLDIRECT in the last 72 hours. ------------------------------------------------------------------------------------------------------------------ No results for input(s): TSH, T4TOTAL, T3FREE, THYROIDAB in the last 72 hours.  Invalid input(s): FREET3 ------------------------------------------------------------------------------------------------------------------ No results for input(s): VITAMINB12, FOLATE, FERRITIN, TIBC, IRON, RETICCTPCT in the last 72 hours.  Coagulation profile No results for input(s): INR, PROTIME in the last 168 hours.  No results for input(s): DDIMER in the last 72 hours.  Cardiac Enzymes No results for input(s): CKMB, TROPONINI, MYOGLOBIN in the last 168 hours.  Invalid input(s): CK ------------------------------------------------------------------------------------------------------------------ Invalid input(s): [place]  09/25/15 2317 09/26/15 0754 09/26/15 1200 09/26/15 1720 09/26/15 2113 09/27/15 0758  GLUCAP 251* 206* 227* 277* 210* 216*     RAI,RIPUDEEP M.D. [facility] 09/27/2015, 10:56 AM  Pager: [phone] Between 7am to 7pm - call Pager - [phone]  After 7pm go to [email or web address] - password [email or web address]  Call night coverage person covering after 7pm

## 2015-09-28 LAB — CBC
HEMATOCRIT: 35.2 % — AB (ref 39.0–52.0)
Hemoglobin: 10.8 g/dL — ABNORMAL LOW (ref 13.0–17.0)
MCH: 23.5 pg — AB (ref 26.0–34.0)
MCHC: 30.7 g/dL (ref 30.0–36.0)
MCV: 76.7 fL — AB (ref 78.0–100.0)
PLATELETS: 276 10*3/uL (ref 150–400)
RBC: 4.59 MIL/uL (ref 4.22–5.81)
RDW: 15.2 % (ref 11.5–15.5)
WBC: 6.3 10*3/uL (ref 4.0–10.5)

## 2015-09-28 LAB — BASIC METABOLIC PANEL
Anion gap: 9 (ref 5–15)
BUN: 10 mg/dL (ref 6–20)
CHLORIDE: 97 mmol/L — AB (ref 101–111)
CO2: 27 mmol/L (ref 22–32)
CREATININE: 0.82 mg/dL (ref 0.61–1.24)
Calcium: 8.4 mg/dL — ABNORMAL LOW (ref 8.9–10.3)
GFR calc Af Amer: >60 mL/min (ref >60)
GFR calc non Af Amer: >60 mL/min (ref >60)
GLUCOSE: 113 mg/dL — AB (ref 65–99)
POTASSIUM: 3.6 mmol/L (ref 3.5–5.1)
Sodium: 133 mmol/L — ABNORMAL LOW (ref 135–145)

## 2015-09-28 LAB — CULTURE, ROUTINE-ABSCESS: Gram Stain: NONE SEEN

## 2015-09-28 LAB — GLUCOSE, CAPILLARY
Glucose-Capillary: 150 mg/dL — ABNORMAL HIGH (ref 65–99)
Glucose-Capillary: 219 mg/dL — ABNORMAL HIGH (ref 65–99)
Glucose-Capillary: 180 mg/dL — ABNORMAL HIGH (ref 65–99)
Glucose-Capillary: 187 mg/dL — ABNORMAL HIGH (ref 65–99)

## 2015-09-28 LAB — HEMOGLOBIN A1C
Hgb A1c MFr Bld: 13.4 % — ABNORMAL HIGH (ref 4.8–5.6)
MEAN PLASMA GLUCOSE: 338 mg/dL

## 2015-09-28 NOTE — Progress Notes (Signed)
Inpatient Diabetes Program Recommendations  AACE/ADA: New Consensus Statement on Inpatient Glycemic Control (2015)  Target Ranges:  Prepandial:   less than 140 mg/dL      Peak postprandial:   less than 180 mg/dL (1-2 hours)      Critically ill patients:  140 - 180 mg/dL   Review of Glycemic Control  Diabetes history: DM2 Outpatient Diabetes medications: Lantus 32 units QHS, Novolog 12 units tidwc Current orders for Inpatient glycemic control: Lantus 25 units QD, Novolog 10 units tidwc +Novolog moderate tidwc and hs  Results for SAHWN, BODDEN (MRN [id number]) as of 09/28/2015 16:20  Ref. Range 09/26/2015 17:20 09/26/2015 21:13 09/27/2015 07:58 09/27/2015 12:15 09/27/2015 16:52 09/27/2015 21:21 09/28/2015 08:03 09/28/2015 12:13  Glucose-Capillary Latest Ref Range: 65-99 mg/dL 277 (H) 210 (H) 216 (H) 242 (H) 105 (H) 167 (H) 150 (H) 219 (H)  Results for ADRI, LEGNER (MRN [id number]) as of 09/28/2015 16:20  Ref. Range 09/25/2015 23:02  Hemoglobin A1C Latest Ref Range: 4.8-5.6 % 13.4 (H)    Inpatient Diabetes Program Recommendations:    Increase Novolog to 12 units tidwc. Needs prescription for glucose meter, strips and lancets  Spoke with pt at length regarding HgbA1C and poor control at home. Pt states his girlfriend gives his insulin and he infrequently monitors his blood sugars. Complained of insulin burning sometimes. Will need to make sure insulin is at room temperature and alcohol is evaporated from skin. Pt states he has to get back on track with checking his blood sugars and f/u with [facility] every 3 months. When GF comes to pick up pt, wants me to speak with her. Encouraged pt to give his own insulin rather than rely on someone else to give it to him. Discussed how improving blood sugars would speed the healing process and reduce risk for complications.  Will f/u in am with any further questions.  Thank you. Lorenda Peck, RD, LDN, CDE Inpatient Diabetes  Coordinator [phone]

## 2015-09-28 NOTE — Care Management Note (Signed)
Case Management Note  Patient Details  Name: Jeffrey [name] MRN: [id number] Date of Birth: March 05, 1966  Subjective/Objective:   Date: 09/28/15 Spoke with patient at the bedside . Introduced self as Tourist information centre manager and explained role in discharge planning and how to be reached. Verified patient lives in town, with sister. Expressed potential no need for no other DME. Verified patient anticipates to go home with family at time of discharge and will have  part-time supervision by family at this time to best of their knowledge. Patient confirmed  needing help with their medication. Patient  is driven by  sister to MD appointments. Verified patient has PCP [facility].    Plan: CM will continue to follow for discharge planning and [facility] resources.                  Action/Plan:   Expected Discharge Date:                  Expected Discharge Plan:  Home/Self Care  In-House Referral:     Discharge planning Services  CM Consult, Follow-up appt scheduled, [facility], Medication Assistance  Post Acute Care Choice:    Choice offered to:     DME Arranged:    DME Agency:     HH Arranged:    HH Agency:     Status of Service:  In process, will continue to follow  Medicare Important Message Given:    Date Medicare IM Given:    Medicare IM give by:    Date Additional Medicare IM Given:    Additional Medicare Important Message give by:     If discussed at [place] of Stay Meetings, dates discussed:    Additional Comments:  Zenon Mayo, RN 09/28/2015, 4:56 PM

## 2015-09-28 NOTE — Progress Notes (Signed)
[facility]                                                                              Patient Demographics  Jeffrey [name], is a 49 y.o. male, DOB - 24-Jan-1966, [id number]  Admit date - 09/25/2015   Admitting Physician Sid Falcon, MD  Outpatient Primary MD for the patient is Lorayne Marek, MD  LOS - 3   Chief Complaint  Patient presents with  . Abscess       Brief HPI   Per Dr. Daryll Drown on 09/25/15 Jeffrey [name] is a 49yo man with PMH of uncontrolled DM2, HTN who presented with pain and warmth to the back. Jeffrey [name] and his wife report that the symptoms started about 1 week ago. He reports that it started as pain and warmth in his back that slowly grew and developed what he thought was an abscess. He also was having fatigue, sweating and likely fever at home, though it was not checked. He also reports drainage at the area. The pain is severe to the point that he cannot lie on his back. He has had a similar presentation about a year ago which required surgical drainage and admission to [facility]. They reported that he did not want to get that sick again, so he came to the hospital. He has a history of furuncles which pop on their own per family. He further has a history of DM which is not well controlled. He sometimes misses his insulin doses.  In the ED, attempted lancing of the most fluctuant part of the wound was unsuccessful, no drainage. He had a LA checked which was normal. His Blood glucose was elevated to the 300s, normal Bicarb. Blood cultures were sent. In the ED, he was also febrile to 101.93F.    Assessment & Plan    Principal Problem:   Cellulitis and abscess of the back, MSSA - Continue IV vancomycin and Zosyn - Follow wound cultures-> MSSA - I&D done at the bedside by surgery, wound packing done, dressing changes twice a day.  -  will transition to oral doxycycline at discharge  Active Problems:   HTN  (hypertension) - Currently stable, continue lisinopril    Diabetic neuropathy (HCC) - Stable    DM (diabetes mellitus) ([place]) uncontrolled - Obtain hemoglobin A1c - Continue Lantus, NovoLog meal coverage, sliding scale insulin   Code Status: Full code  Family Communication: Discussed in detail with the patient, all imaging results, lab results explained to the patient    Disposition Plan: DC in 24-48 hours when cleared by surgery,  Time Spent in minutes   25 minutes  Procedures  None  Consults   Gen. surgery  DVT Prophylaxis Lovenox  Medications  Scheduled Meds: . acetaminophen  1,000 mg Oral TID  . docusate sodium  100 mg Oral BID  . enoxaparin (LOVENOX) injection  40 mg Subcutaneous Daily  . gabapentin  400 mg Oral TID  . insulin aspart  0-15 Units Subcutaneous TID WC  . insulin aspart  0-5 Units Subcutaneous QHS  . insulin aspart  10 Units Subcutaneous TID WC  . insulin glargine  25 Units Subcutaneous Daily  . lisinopril  5 mg Oral Daily  . piperacillin-tazobactam (ZOSYN)  IV  3.375 g Intravenous 3 times per day  . vancomycin  1,000 mg Intravenous Q12H   Continuous Infusions:  PRN Meds:.bisacodyl, methocarbamol, morphine injection, ondansetron **OR** ondansetron (ZOFRAN) IV, oxyCODONE   Antibiotics   Anti-infectives    Start     Dose/Rate Route Frequency Ordered Stop   09/26/15 1000  terbinafine (LAMISIL) tablet 250 mg  Status:  Discontinued     250 mg Oral Daily 09/25/15 2300 09/25/15 2302   09/26/15 0930  piperacillin-tazobactam (ZOSYN) IVPB 3.375 g     3.375 g 12.5 mL/hr over 240 Minutes Intravenous 3 times per day 09/26/15 0827     09/26/15 0700  vancomycin (VANCOCIN) IVPB 1000 mg/200 mL premix     1,000 mg 200 mL/hr over 60 Minutes Intravenous Every 12 hours 09/25/15 2008     09/25/15 1900  vancomycin (VANCOCIN) 1,750 mg in sodium chloride 0.9 % 500 mL IVPB     1,750 mg 250 mL/hr over 120 Minutes Intravenous  Once 09/25/15 1856 09/25/15 2235         Subjective:   Jeffrey [name] was seen and examined today. Feels a lot better today, low-grade temp 99.1. Still having purulent drainage from the back.  Patient denies dizziness, chest pain, shortness of breath, abdominal pain, N/V/D/C, new weakness, numbess, tingling. No acute events overnight.    Objective:   Blood pressure 144/85, pulse 78, temperature 99.1 F (37.3 C), temperature source Oral, resp. rate 17, height 5\' 7"  (1.702 m), weight 82.872 kg (182 lb 11.2 oz), SpO2 98 %.  Wt Readings from Last 3 Encounters:  09/25/15 82.872 kg (182 lb 11.2 oz)  09/09/15 84.823 kg (187 lb)  07/01/15 83.371 kg (183 lb 12.8 oz)     Intake/Output Summary (Last 24 hours) at 09/28/15 1504 Last data filed at 09/28/15 1032  Gross per 24 hour  Intake    410 ml  Output    675 ml  Net   -265 ml    Exam  General: Alert and oriented x 3, NAD  HEENT:  PERRLA, EOMI, Anicteric Sclera,  Neck: Supple, no JVD, no masses  CVS: S1 S2 clear, RRR  Respiratory: CTAB  Abdomen: Soft, nontender, nondistended, + bowel sounds  Ext: no cyanosis clubbing or edema  Neuro: no new deficits  Skin: Still having purulent drainage from the abscess,  Psych: Normal affect and demeanor, alert and oriented x3    Data Review   Micro Results Recent Results (from the past 240 hour(s))  Blood culture (routine x 2)     Status: None (Preliminary result)   Collection Time: 09/25/15  7:09 PM  Result Value Ref Range Status   Specimen Description BLOOD LEFT FOREARM  Final   Special Requests BOTTLES DRAWN AEROBIC AND ANAEROBIC 5MLS  Final   Culture NO GROWTH 3 DAYS  Final   Report Status PENDING  Incomplete  Culture, routine-abscess     Status: None   Collection Time: 09/25/15  7:30 PM  Result Value Ref Range Status   Specimen Description ABSCESS BACK  Final   Special Requests MID BACK  Final   Gram Stain   Final    NO WBC SEEN NO SQUAMOUS EPITHELIAL CELLS SEEN NO ORGANISMS SEEN Performed at [facility]    Culture   Final    MODERATE STAPHYLOCOCCUS AUREUS Note: RIFAMPIN AND GENTAMICIN SHOULD NOT BE USED AS SINGLE DRUGS FOR TREATMENT  OF STAPH INFECTIONS. This organism DOES NOT demonstrate inducible Clindamycin resistance in vitro. Performed at [facility]    Report Status 09/28/2015 FINAL  Final   Organism ID, Bacteria STAPHYLOCOCCUS AUREUS  Final      Susceptibility   Staphylococcus aureus - MIC*    CLINDAMYCIN <=0.25 SENSITIVE Sensitive     ERYTHROMYCIN >=8 RESISTANT Resistant     GENTAMICIN <=0.5 SENSITIVE Sensitive     LEVOFLOXACIN 0.25 SENSITIVE Sensitive     OXACILLIN 1 SENSITIVE Sensitive     RIFAMPIN <=0.5 SENSITIVE Sensitive     TRIMETH/SULFA 160 RESISTANT Resistant     VANCOMYCIN 1 SENSITIVE Sensitive     TETRACYCLINE <=1 SENSITIVE Sensitive     MOXIFLOXACIN <=0.25 SENSITIVE Sensitive     * MODERATE STAPHYLOCOCCUS AUREUS  Blood culture (routine x 2)     Status: None (Preliminary result)   Collection Time: 09/25/15  7:43 PM  Result Value Ref Range Status   Specimen Description BLOOD BLOOD LEFT FOREARM  Final   Special Requests BOTTLES DRAWN AEROBIC ONLY 4MLS  Final   Culture NO GROWTH 3 DAYS  Final   Report Status PENDING  Incomplete  Wound culture     Status: None (Preliminary result)   Collection Time: 09/26/15  5:04 PM  Result Value Ref Range Status   Specimen Description WOUND BACK  Final   Special Requests Normal  Final   Gram Stain   Final    FEW WBC PRESENT, PREDOMINANTLY PMN FEW GRAM POSITIVE COCCI IN PAIRS Performed at [facility]    Culture   Final    ABUNDANT STAPHYLOCOCCUS AUREUS Note: RIFAMPIN AND GENTAMICIN SHOULD NOT BE USED AS SINGLE DRUGS FOR TREATMENT OF STAPH INFECTIONS. Performed at [facility]    Report Status PENDING  Incomplete    Radiology Reports No results found.  CBC  Recent Labs Lab 09/25/15 1900 09/26/15 0557 09/27/15 0528 09/28/15 0431  WBC 9.7 10.2 10.4 6.3  HGB 12.7* 11.5* 10.8*  10.8*  HCT 39.4 36.5* 35.1* 35.2*  PLT 232 215 241 276  MCV 75.8* 76.7* 76.6* 76.7*  MCH 24.4* 24.2* 23.6* 23.5*  MCHC 32.2 31.5 30.8 30.7  RDW 15.1 15.3 15.3 15.2  LYMPHSABS 1.2  --   --   --   MONOABS 0.9  --   --   --   EOSABS 0.0  --   --   --   BASOSABS 0.1  --   --   --     Chemistries   Recent Labs Lab 09/25/15 1900 09/26/15 0557 09/27/15 0528 09/28/15 0431  NA 133* 135 132* 133*  K 4.1 3.5 3.5 3.6  CL 98* 103 99* 97*  CO2 25 25 24 27   GLUCOSE 371* 235* 227* 113*  BUN 9 6 12 10   CREATININE 0.79 0.59* 0.90 0.82  CALCIUM 9.4 8.4* 8.5* 8.4*   ------------------------------------------------------------------------------------------------------------------ estimated creatinine clearance is 111 mL/min (by C-G formula based on Cr of 0.82). ------------------------------------------------------------------------------------------------------------------  Recent Labs  09/25/15 2302  HGBA1C 13.4*   ------------------------------------------------------------------------------------------------------------------ No results for input(s): CHOL, HDL, LDLCALC, TRIG, CHOLHDL, LDLDIRECT in the last 72 hours. ------------------------------------------------------------------------------------------------------------------ No results for input(s): TSH, T4TOTAL, T3FREE, THYROIDAB in the last 72 hours.  Invalid input(s): FREET3 ------------------------------------------------------------------------------------------------------------------ No results for input(s): VITAMINB12, FOLATE, FERRITIN, TIBC, IRON, RETICCTPCT in the last 72 hours.  Coagulation profile No results for input(s): INR, PROTIME in the last 168 hours.  No results for input(s): DDIMER in the last 72 hours.  Cardiac Enzymes No  results for input(s): CKMB, TROPONINI, MYOGLOBIN in the last 168 hours.  Invalid input(s):  CK ------------------------------------------------------------------------------------------------------------------ Invalid input(s): [place]  09/27/15 0758 09/27/15 1215 09/27/15 1652 09/27/15 2121 09/28/15 0803 09/28/15 1213  GLUCAP 216* 242* 105* 167* 150* 219*     Zooey Schreurs M.D. [facility] 09/28/2015, 3:04 PM  Pager: [phone] Between 7am to 7pm - call Pager - [phone]  After 7pm go to [email or web address] - password [email or web address]  Call night coverage person covering after 7pm

## 2015-09-28 NOTE — Progress Notes (Signed)
Patient ID: Jeffrey [name], male   DOB: 23-Mar-1966, 49 y.o.   MRN: [id number]    Subjective: Pt feels ok.  No new complaints  Objective: Vital signs in last 24 hours: Temp:  [98.7 F (37.1 C)-100.8 F (38.2 C)] 99.1 F (37.3 C) (10/17 0906) Pulse Rate:  [77-89] 78 (10/17 0906) Resp:  [17-18] 17 (10/17 0533) BP: (121-144)/(59-85) 144/85 mmHg (10/17 0906) SpO2:  [96 %-98 %] 98 % (10/17 0533) Last BM Date: 09/25/15  Intake/Output from previous day: 10/16 0701 - 10/17 0700 In: 46 [P.O.:410; IV Piggyback:50] Out: 675 [Urine:675] Intake/Output this shift:    PE: Skin: back abscess still with some thick purulent drainage present.  Still with some surrounding induration.  Lab Results:   Recent Labs  09/27/15 0528 09/28/15 0431  WBC 10.4 6.3  HGB 10.8* 10.8*  HCT 35.1* 35.2*  PLT 241 276   BMET  Recent Labs  09/27/15 0528 09/28/15 0431  NA 132* 133*  K 3.5 3.6  CL 99* 97*  CO2 24 27  GLUCOSE 227* 113*  BUN 12 10  CREATININE 0.90 0.82  CALCIUM 8.5* 8.4*   PT/INR No results for input(s): LABPROT, INR in the last 72 hours. CMP     Component Value Date/Time   NA 133* 09/28/2015 0431   K 3.6 09/28/2015 0431   CL 97* 09/28/2015 0431   CO2 27 09/28/2015 0431   GLUCOSE 113* 09/28/2015 0431   BUN 10 09/28/2015 0431   CREATININE 0.82 09/28/2015 0431   CREATININE 0.70 03/04/2015 1233   CALCIUM 8.4* 09/28/2015 0431   PROT 7.5 03/04/2015 1233   ALBUMIN 4.1 03/04/2015 1233   AST 13 03/04/2015 1233   ALT 13 03/04/2015 1233   ALKPHOS 86 03/04/2015 1233   BILITOT 0.5 03/04/2015 1233   GFRNONAA >60 09/28/2015 0431   GFRNONAA >89 03/04/2015 1233   GFRAA >60 09/28/2015 0431   GFRAA >89 03/04/2015 1233   Lipase  No results found for: LIPASE     Studies/Results: No results found.  Anti-infectives: Anti-infectives    Start     Dose/Rate Route Frequency Ordered Stop   09/26/15 1000  terbinafine (LAMISIL) tablet 250 mg  Status:  Discontinued     250 mg Oral  Daily 09/25/15 2300 09/25/15 2302   09/26/15 0930  piperacillin-tazobactam (ZOSYN) IVPB 3.375 g     3.375 g 12.5 mL/hr over 240 Minutes Intravenous 3 times per day 09/26/15 0827     09/26/15 0700  vancomycin (VANCOCIN) IVPB 1000 mg/200 mL premix     1,000 mg 200 mL/hr over 60 Minutes Intravenous Every 12 hours 09/25/15 2008     09/25/15 1900  vancomycin (VANCOCIN) 1,750 mg in sodium chloride 0.9 % 500 mL IVPB     1,750 mg 250 mL/hr over 120 Minutes Intravenous  Once 09/25/15 1856 09/25/15 2235       Assessment/Plan  1. S/p bedside I&D of back abscess -still with purulent drainage present.  Spiked temp to 100.8 last night despite tylenol.  -change dressing BID and prn. -orders written to get him in the shower TID with the hand held today to try and clean the wound out.  If it looks better, then he can likely dc home tomorrow -cultures reveal just staph aureus.  Can likely start to narrow abx.    LOS: 3 days    Terius Jacuinde E 09/28/2015, 9:09 AM Pager: [phone]

## 2015-09-29 LAB — CBC
HEMATOCRIT: 33.4 % — AB (ref 39.0–52.0)
HEMOGLOBIN: 10.6 g/dL — AB (ref 13.0–17.0)
MCH: 24.1 pg — AB (ref 26.0–34.0)
MCHC: 31.7 g/dL (ref 30.0–36.0)
MCV: 75.9 fL — AB (ref 78.0–100.0)
PLATELETS: 270 10*3/uL (ref 150–400)
RBC: 4.4 MIL/uL (ref 4.22–5.81)
RDW: 15.2 % (ref 11.5–15.5)
WBC: 5.2 10*3/uL (ref 4.0–10.5)

## 2015-09-29 LAB — WOUND CULTURE: SPECIAL REQUESTS: NORMAL

## 2015-09-29 LAB — BASIC METABOLIC PANEL
Anion gap: 7 (ref 5–15)
BUN: 5 mg/dL — ABNORMAL LOW (ref 6–20)
CHLORIDE: 101 mmol/L (ref 101–111)
CO2: 27 mmol/L (ref 22–32)
CREATININE: 0.75 mg/dL (ref 0.61–1.24)
Calcium: 8.8 mg/dL — ABNORMAL LOW (ref 8.9–10.3)
GFR calc Af Amer: >60 mL/min (ref >60)
GFR calc non Af Amer: >60 mL/min (ref >60)
Glucose, Bld: 154 mg/dL — ABNORMAL HIGH (ref 65–99)
POTASSIUM: 3.5 mmol/L (ref 3.5–5.1)
SODIUM: 135 mmol/L (ref 135–145)

## 2015-09-29 LAB — GLUCOSE, CAPILLARY
Glucose-Capillary: 164 mg/dL — ABNORMAL HIGH (ref 65–99)
Glucose-Capillary: 179 mg/dL — ABNORMAL HIGH (ref 65–99)

## 2015-09-29 LAB — HEMOGLOBIN A1C
Hgb A1c MFr Bld: 13.4 % — ABNORMAL HIGH (ref 4.8–5.6)
Mean Plasma Glucose: 338 mg/dL

## 2015-09-29 MED ORDER — DOCUSATE SODIUM 100 MG PO CAPS
100.0000 mg | ORAL_CAPSULE | Freq: Two times a day (BID) | ORAL | Status: DC | PRN
Start: 2015-09-29 — End: 2016-03-30

## 2015-09-29 MED ORDER — HYDROCODONE-ACETAMINOPHEN 5-325 MG PO TABS: 1.0000 | ORAL_TABLET | Freq: Four times a day (QID) | ORAL | Status: DC | PRN

## 2015-09-29 MED ORDER — DOXYCYCLINE HYCLATE 100 MG PO CAPS: 100.0000 mg | ORAL_CAPSULE | Freq: Two times a day (BID) | ORAL | Status: DC

## 2015-09-29 MED ORDER — INSULIN ASPART 100 UNIT/ML ~~LOC~~ SOLN: 0.0000 [IU] | Freq: Three times a day (TID) | SUBCUTANEOUS | Status: DC

## 2015-09-29 NOTE — Progress Notes (Signed)
Greggory Keen to be D/C'd Home per MD order.  Discussed with the patient and all questions fully answered.  VSS, Skin clean, dry and intact without evidence of skin break down, no evidence of skin tears noted. IV catheter discontinued intact. Site without signs and symptoms of complications. Dressing and pressure applied.  An After Visit Summary was printed and given to the patient. Patient received prescription.  D/c education completed with patient/family including follow up instructions, medication list, d/c activities limitations if indicated, with other d/c instructions as indicated by MD - patient able to verbalize understanding, all questions fully answered.   Patient instructed to return to ED, call 911, or call MD for any changes in condition.   Patient escorted via [place], and D/C home via private auto.  Elon Jester 09/29/2015 1:49 PM

## 2015-09-29 NOTE — Discharge Summary (Signed)
Physician Discharge Summary   Patient ID: Jeffrey [name] MRN: [id number] DOB/AGE: Nov 28, 1966 49 y.o.  Admit date: 09/25/2015 Discharge date: 09/29/2015  Primary Care Physician:  Jeffrey Marek, MD  Discharge Diagnoses:    . abscess on the back  . Diabetic neuropathy ([place]) . HTN (hypertension)  Consults: Gen. surgery, Dr. Ninfa [name]   Recommendations for Outpatient Follow-up:  Patient was amended to continue doxycycline for 10 days  Wound care with dressing changes twice a day  [facility] and [facility] surgery arranged  Follow insulin regimen closely, may need further adjustments at the follow-up appointment  TESTS THAT NEED FOLLOW-UP None   DIET: Carb modified diet    Allergies:  No Known Allergies   Discharge Medications:   Medication List    STOP taking these medications        clotrimazole 1 % cream  Commonly known as:  LOTRIMIN     hydrocortisone cream 1 %      TAKE these medications        ALEVE 220 MG tablet  Generic drug:  naproxen sodium  Take 440 mg by mouth at bedtime as needed.     docusate sodium 100 MG capsule  Commonly known as:  COLACE  Take 1 capsule (100 mg total) by mouth 2 (two) times daily as needed for mild constipation.     doxycycline 100 MG capsule  Commonly known as:  VIBRAMYCIN  Take 1 capsule (100 mg total) by mouth 2 (two) times daily. X 10days     gabapentin 400 MG capsule  Commonly known as:  NEURONTIN  Take 1 capsule (400 mg total) by mouth 3 (three) times daily.     HYDROcodone-acetaminophen 5-325 MG tablet  Commonly known as:  NORCO/VICODIN  Take 1-2 tablets by mouth every 6 (six) hours as needed for moderate pain or severe pain.     ibuprofen 600 MG tablet  Commonly known as:  ADVIL,MOTRIN  Take 1 tablet (600 mg total) by mouth every 6 (six) hours as needed.     insulin aspart 100 UNIT/ML injection  Commonly known as:  novoLOG  INJECT 0.1 MLS (10 UNITS  TOTAL) INTO THE SKIN 3 TIMES DAILY WITH MEALS     insulin aspart 100 UNIT/ML injection  Commonly known as:  novoLOG  Inject 0-15 Units into the skin 3 (three) times daily with meals. Sliding scale  CBG 70 - 120: 0 units: CBG 121 - 150: 2 units; CBG 151 - 200: 3 units; CBG 201 - 250: 5 units; CBG 251 - 300: 8 units;CBG 301 - 350: 11 units; CBG 351 - 400: 15 units; CBG > 400 : 15 units and notify your  MD     insulin glargine 100 UNIT/ML injection  Commonly known as:  LANTUS  Inject 0.32 mLs (32 Units total) into the skin daily.     Insulin Pen Needle 31G X 5 MM Misc  Check blood sugar TID & QHS     INSULIN SYRINGE 1CC/31GX5/16" 31G X 5/16" 1 ML Misc  Check blood sugar TID & QHS     lisinopril 5 MG tablet  Commonly known as:  PRINIVIL,ZESTRIL  Take 1 tablet (5 mg total) by mouth daily.     methocarbamol 500 MG tablet  Commonly known as:  ROBAXIN  Take 1 tablet (500 mg total) by mouth 2 (two) times daily.     naproxen 500 MG tablet  Commonly known as:  NAPROSYN  Take 1  tablet (500 mg total) by mouth 2 (two) times daily.         Brief H and P: For complete details please refer to admission H and P, but in brief Per Dr. Daryll [name] on 09/25/15 Mr. Jeffrey [name] is a 49yo man with PMH of uncontrolled DM2, HTN who presented with pain and warmth to the back. Mr. Jeffrey [name] and his wife report that the symptoms started about 1 week ago. He reports that it started as pain and warmth in his back that slowly grew and developed what he thought was an abscess. He also was having fatigue, sweating and likely fever at home, though it was not checked. He also reports drainage at the area. The pain is severe to the point that he cannot lie on his back. He has had a similar presentation about a year ago which required surgical drainage and admission to [facility]. They reported that he did not want to get that sick again, so he came to the hospital. He has a history of furuncles which pop on their  own per family. He further has a history of DM which is not well controlled. He sometimes misses his insulin doses.  In the ED, attempted lancing of the most fluctuant part of the wound was unsuccessful, no drainage. He had a LA checked which was normal. His Blood glucose was elevated to the 300s, normal Bicarb. Blood cultures were sent. In the ED, he was also febrile to 101.44F.  Hospital Course:   Cellulitis and abscess of the back, MSSA Patient was placed on IV vancomycin and Zosyn. He had the I&D done in the ED which showed MSSA. Given the patient has significant induration and purulent drainage from the abscess, Gen. surgery was consulted. He underwent a bedside I&D with wound packing. Dressing changes were done and had significant improvement. At this time surgery has cleared him to be discharged home with doxycycline for 10 days and dressing changes twice a day. He has a friend at home who will be doing the packing of the wound and dressings.    HTN (hypertension) - Currently stable, continue lisinopril   Diabetic neuropathy ([facility]) - Stable   DM (diabetes mellitus) ([place]) uncontrolled - Hemoglobin A1c 13.4, uncontrolled. Patient was placed on Lantus NovoLog meal coverage and sliding scale insulin please adjust insulin regimen at the follow-up appointment.  Day of Discharge BP 140/83 mmHg  Pulse 74  Temp(Src) 98.3 F (36.8 C) (Oral)  Resp 16  Ht 5\' 7"  (1.702 m)  Wt 82.872 kg (182 lb 11.2 oz)  BMI 28.61 kg/m2  SpO2 98%  Physical Exam: General: Alert and awake oriented x3 not in any acute distress. HEENT: anicteric sclera, pupils reactive to light and accommodation CVS: S1-S2 clear no murmur rubs or gallops Chest: clear to auscultation bilaterally, no wheezing rales or rhonchi Abdomen: soft nontender, nondistended, normal bowel sounds Extremities: no cyanosis, clubbing or edema noted bilaterally Neuro: Cranial nerves II-XII intact, no focal neurological  deficits Back: Dressing intact  The results of significant diagnostics from this hospitalization (including imaging, microbiology, ancillary and laboratory) are listed below for reference.    LAB RESULTS: Basic Metabolic Panel:  Recent Labs Lab 09/28/15 0431 09/29/15 0443  NA 133* 135  K 3.6 3.5  CL 97* 101  CO2 27 27  GLUCOSE 113* 154*  BUN 10 5*  CREATININE 0.82 0.75  CALCIUM 8.4* 8.8*   Liver Function Tests: No results for input(s): AST, ALT, ALKPHOS, BILITOT, PROT, ALBUMIN in the last  168 hours. No results for input(s): LIPASE, AMYLASE in the last 168 hours. No results for input(s): AMMONIA in the last 168 hours. CBC:  Recent Labs Lab 09/25/15 1900  09/28/15 0431 09/29/15 0443  WBC 9.7  < > 6.3 5.2  NEUTROABS 7.5  --   --   --   HGB 12.7*  < > 10.8* 10.6*  HCT 39.4  < > 35.2* 33.4*  MCV 75.8*  < > 76.7* 75.9*  PLT 232  < > 276 270  < > = values in this interval not displayed. Cardiac Enzymes: No results for input(s): CKTOTAL, CKMB, CKMBINDEX, TROPONINI in the last 168 hours. BNP: Invalid input(s): POCBNP CBG:  Recent Labs Lab 09/29/15 0757 09/29/15 1220  GLUCAP 164* 179*    Significant Diagnostic Studies:  No results found.  2D ECHO:   Disposition and Follow-up:     Discharge Instructions    Diet Carb Modified    Complete by:  As directed      Discharge instructions    Complete by:  As directed   Wound care: 1.  Remove cover dressing & packing.  May moisten to help remove. 2.  Wash wound gently with soap & warm tap water 3.  Repack wound with 1/4": Use NU-gauze ribbon plain gauze 4.  Cover with dry dressing (gauze or ABD pads) & paper tape.     Increase activity slowly    Complete by:  As directed             DISPOSITION: Home   DISCHARGE FOLLOW-UP Follow-up Information    Follow up with [facility] On 10/07/2015.   Why:  9 am for hospital follow up   Contact information:   [address] [place] [phone]      Follow up with Raylene Miyamoto, PA-C On 10/14/2015.   Specialty:  Surgery   Why:  [facility] Surgery, 10:45am ,arrive no later than 10:15am for paperwork   Contact information:   [place] [address] [phone]        Time spent on Discharge: 25 minutes  Signed:   Justin Buechner M.D. [facility] 09/29/2015, 12:54 PM Pager: [phone]

## 2015-09-29 NOTE — Care Management Note (Signed)
Case Management Note  Patient Details  Name: ISIDORE FRANC MRN: [id number] Date of Birth: Jun 10, 1966  Subjective/Objective:    Patient is for dc today, patient states he has transportation at discharge and states he does not need HHRN for bid dressing changes, patient states his friend will be able to do dressing changes for him.  NCM canceled HHRN with [facility].   Patient will have follow up apt at [facility] clinic on 10/26 at 9 am.  He will be discharged on po doxy.                  Action/Plan:   Expected Discharge Date:                  Expected Discharge Plan:  Home/Self Care  In-House Referral:     Discharge planning Services  CM Consult, Follow-up appt scheduled, [facility], Medication Assistance  Post Acute Care Choice:    Choice offered to:     DME Arranged:    DME Agency:     HH Arranged:    [place] Agency:     Status of Service:  Completed, signed off  Medicare Important Message Given:    Date Medicare IM Given:    Medicare IM give by:    Date Additional Medicare IM Given:    Additional Medicare Important Message give by:     If discussed at [place] of Stay Meetings, dates discussed:    Additional Comments:  Zenon Mayo, RN 09/29/2015, 10:41 AM

## 2015-09-29 NOTE — Discharge Instructions (Signed)
May get Iodoform gauze packing strips at [employer] or [employer]  Pack wound twice a day with iodoform strips.  Remove packing prior to shower and replace when out of the shower.

## 2015-09-29 NOTE — Progress Notes (Signed)
Patient ID: Jeffrey [name], male   DOB: 1966-04-07, 49 y.o.   MRN: [id number]    Subjective: Pt feels well today.  Objective: Vital signs in last 24 hours: Temp:  [98.3 F (36.8 C)-99.6 F (37.6 C)] 98.3 F (36.8 C) (10/18 0447) Pulse Rate:  [74-78] 74 (10/18 0447) Resp:  [16-18] 16 (10/18 0447) BP: (140-144)/(83-93) 140/83 mmHg (10/18 0447) SpO2:  [98 %-99 %] 98 % (10/18 0447) Last BM Date: 09/26/15  Intake/Output from previous day: 10/17 0701 - 10/18 0700 In: 1960 [P.O.:480; IV Piggyback:1400] Out: 775 [Urine:775] Intake/Output this shift:    PE: Skin: back wound is cleaner today.  Nowhere near as much drainage.    Lab Results:   Recent Labs  09/28/15 0431 09/29/15 0443  WBC 6.3 5.2  HGB 10.8* 10.6*  HCT 35.2* 33.4*  PLT 276 270   BMET  Recent Labs  09/28/15 0431 09/29/15 0443  NA 133* 135  K 3.6 3.5  CL 97* 101  CO2 27 27  GLUCOSE 113* 154*  BUN 10 5*  CREATININE 0.82 0.75  CALCIUM 8.4* 8.8*   PT/INR No results for input(s): LABPROT, INR in the last 72 hours. CMP     Component Value Date/Time   NA 135 09/29/2015 0443   K 3.5 09/29/2015 0443   CL 101 09/29/2015 0443   CO2 27 09/29/2015 0443   GLUCOSE 154* 09/29/2015 0443   BUN 5* 09/29/2015 0443   CREATININE 0.75 09/29/2015 0443   CREATININE 0.70 03/04/2015 1233   CALCIUM 8.8* 09/29/2015 0443   PROT 7.5 03/04/2015 1233   ALBUMIN 4.1 03/04/2015 1233   AST 13 03/04/2015 1233   ALT 13 03/04/2015 1233   ALKPHOS 86 03/04/2015 1233   BILITOT 0.5 03/04/2015 1233   GFRNONAA >60 09/29/2015 0443   GFRNONAA >89 03/04/2015 1233   GFRAA >60 09/29/2015 0443   GFRAA >89 03/04/2015 1233   Lipase  No results found for: LIPASE     Studies/Results: No results found.  Anti-infectives: Anti-infectives    Start     Dose/Rate Route Frequency Ordered Stop   09/26/15 1000  terbinafine (LAMISIL) tablet 250 mg  Status:  Discontinued     250 mg Oral Daily 09/25/15 2300 09/25/15 2302   09/26/15 0930   piperacillin-tazobactam (ZOSYN) IVPB 3.375 g     3.375 g 12.5 mL/hr over 240 Minutes Intravenous 3 times per day 09/26/15 0827     09/26/15 0700  vancomycin (VANCOCIN) IVPB 1000 mg/200 mL premix     1,000 mg 200 mL/hr over 60 Minutes Intravenous Every 12 hours 09/25/15 2008     09/25/15 1900  vancomycin (VANCOCIN) 1,750 mg in sodium chloride 0.9 % 500 mL IVPB     1,750 mg 250 mL/hr over 120 Minutes Intravenous  Once 09/25/15 1856 09/25/15 2235       Assessment/Plan  1. S/p bedside incision and drainage of back abscess -cleaner today. -cont BID dressing changes as home.  Patient has someone to change these for him.  He does NOT need HH. -follow up with [place] on 10-14-15 for wound recheck -surgically stable for dc home  -7-10 days total of abx should be adequate  LOS: 4 days    Floreine Kingdon E 09/29/2015, 8:20 AM Pager: [phone]

## 2015-09-30 LAB — CULTURE, BLOOD (ROUTINE X 2)
CULTURE: NO GROWTH
CULTURE: NO GROWTH

## 2015-10-06 ENCOUNTER — Inpatient Hospital Stay: Payer: Self-pay | Admitting: Family Medicine

## 2015-10-07 ENCOUNTER — Ambulatory Visit: Payer: Self-pay | Attending: Family Medicine | Admitting: Family Medicine

## 2015-10-07 ENCOUNTER — Other Ambulatory Visit: Payer: Self-pay | Admitting: Pharmacist

## 2015-10-07 ENCOUNTER — Encounter: Payer: Self-pay | Admitting: Family Medicine

## 2015-10-07 VITALS — BP 171/99 | HR 70 | Temp 98.2°F | Resp 68 | Ht 67.0 in | Wt 184.0 lb

## 2015-10-07 DIAGNOSIS — M79606 Pain in leg, unspecified: Secondary | ICD-10-CM

## 2015-10-07 DIAGNOSIS — Z794 Long term (current) use of insulin: Secondary | ICD-10-CM | POA: Insufficient documentation

## 2015-10-07 DIAGNOSIS — I10 Essential (primary) hypertension: Secondary | ICD-10-CM | POA: Insufficient documentation

## 2015-10-07 DIAGNOSIS — L309 Dermatitis, unspecified: Secondary | ICD-10-CM | POA: Insufficient documentation

## 2015-10-07 DIAGNOSIS — E1140 Type 2 diabetes mellitus with diabetic neuropathy, unspecified: Secondary | ICD-10-CM | POA: Insufficient documentation

## 2015-10-07 DIAGNOSIS — Z0000 Encounter for general adult medical examination without abnormal findings: Secondary | ICD-10-CM

## 2015-10-07 DIAGNOSIS — E1142 Type 2 diabetes mellitus with diabetic polyneuropathy: Secondary | ICD-10-CM

## 2015-10-07 DIAGNOSIS — E119 Type 2 diabetes mellitus without complications: Secondary | ICD-10-CM

## 2015-10-07 DIAGNOSIS — L02212 Cutaneous abscess of back [any part, except buttock]: Secondary | ICD-10-CM | POA: Insufficient documentation

## 2015-10-07 LAB — GLUCOSE, POCT (MANUAL RESULT ENTRY): POC GLUCOSE: 230 mg/dL — AB (ref 70–99)

## 2015-10-07 MED ORDER — INSULIN GLARGINE 100 UNIT/ML SOLOSTAR PEN: 40.0000 [IU] | PEN_INJECTOR | Freq: Every day | SUBCUTANEOUS | Status: DC

## 2015-10-07 MED ORDER — GABAPENTIN 400 MG PO CAPS: 400.0000 mg | ORAL_CAPSULE | Freq: Three times a day (TID) | ORAL | Status: DC

## 2015-10-07 MED ORDER — INSULIN ASPART 100 UNIT/ML ~~LOC~~ SOLN: 0.0000 [IU] | Freq: Three times a day (TID) | SUBCUTANEOUS | Status: DC

## 2015-10-07 MED ORDER — HYDROCORTISONE 2.5 % EX CREA: TOPICAL_CREAM | Freq: Two times a day (BID) | CUTANEOUS | Status: DC

## 2015-10-07 MED ORDER — LISINOPRIL 5 MG PO TABS: 5.0000 mg | ORAL_TABLET | Freq: Every day | ORAL | Status: DC

## 2015-10-07 MED ORDER — INSULIN GLARGINE 100 UNIT/ML ~~LOC~~ SOLN: 40.0000 [IU] | Freq: Every day | SUBCUTANEOUS | Status: DC

## 2015-10-07 NOTE — Progress Notes (Signed)
Pt here for HFU, for cellulitis of back. Pt reports some sorness in back rated at level 6/10. Pt states that his back is very itchy.  Pt has not taken meds this morning.  Pt requesting refill on meds .

## 2015-10-07 NOTE — Patient Instructions (Signed)
Diabetes and Exercise Exercising regularly is important. It is not just about losing weight. It has many health benefits, such as:  Improving your overall fitness, flexibility, and endurance.  Increasing your bone density.  Helping with weight control.  Decreasing your body fat.  Increasing your muscle strength.  Reducing stress and tension.  Improving your overall health. People with diabetes who exercise gain additional benefits because exercise:  Reduces appetite.  Improves the body's use of blood sugar (glucose).  Helps lower or control blood glucose.  Decreases blood pressure.  Helps control blood lipids (such as cholesterol and triglycerides).  Improves the body's use of the hormone insulin by:  Increasing the body's insulin sensitivity.  Reducing the body's insulin needs.  Decreases the risk for heart disease because exercising:  Lowers cholesterol and triglycerides levels.  Increases the levels of good cholesterol (such as high-density lipoproteins [HDL]) in the body.  Lowers blood glucose levels. YOUR ACTIVITY PLAN  Choose an activity that you enjoy, and set realistic goals. To exercise safely, you should begin practicing any new physical activity slowly, and gradually increase the intensity of the exercise over time. Your health care provider or diabetes educator can help create an activity plan that works for you. General recommendations include:  Encouraging children to engage in at least 60 minutes of physical activity each day.  Stretching and performing strength training exercises, such as yoga or weight lifting, at least 2 times per week.  Performing a total of at least 150 minutes of moderate-intensity exercise each week, such as brisk walking or water aerobics.  Exercising at least 3 days per week, making sure you allow no more than 2 consecutive days to pass without exercising.  Avoiding long periods of inactivity (90 minutes or more). When you  have to spend an extended period of time sitting down, take frequent breaks to walk or stretch. RECOMMENDATIONS FOR EXERCISING WITH TYPE 1 OR TYPE 2 DIABETES   Check your blood glucose before exercising. If blood glucose levels are greater than 240 mg/dL, check for urine ketones. Do not exercise if ketones are present.  Avoid injecting insulin into areas of the body that are going to be exercised. For example, avoid injecting insulin into:  The arms when playing tennis.  The legs when jogging.  Keep a record of:  Food intake before and after you exercise.  Expected peak times of insulin action.  Blood glucose levels before and after you exercise.  The type and amount of exercise you have done.  Review your records with your health care provider. Your health care provider will help you to develop guidelines for adjusting food intake and insulin amounts before and after exercising.  If you take insulin or oral hypoglycemic agents, watch for signs and symptoms of hypoglycemia. They include:  Dizziness.  Shaking.  Sweating.  Chills.  Confusion.  Drink plenty of water while you exercise to prevent dehydration or heat stroke. Body water is lost during exercise and must be replaced.  Talk to your health care provider before starting an exercise program to make sure it is safe for you. Remember, almost any type of activity is better than none.   This information is not intended to replace advice given to you by your health care provider. Make sure you discuss any questions you have with your health care provider.   Document Released: 02/18/2004 Document Revised: 04/14/2015 Document Reviewed: 05/07/2013 Elsevier Interactive Patient Education 2016 [employer]  

## 2015-10-07 NOTE — Progress Notes (Signed)
CC: Follow-up from hospitalization for back abscess (09/25/15-09/29/15).  HPI: Jeffrey [name] is a 49 y.o. male with a history of type 2 diabetes mellitus (A1c of 13.4), hypertension who presented to the ED at [facility] with a mid back abscess.Marland Kitchen   On presentation he was noted to be febrile with a temperature 101.6 and he was admitted and placed on IV vancomycin and Zosyn. Incision and drainage performed in the ED and cultures grew MSSA. Gen. surgery was consulted due to significant induration and purulent discharge and he underwent a repeat bedside incision and drainage with wound packing with iodoform. He was subsequently transitioned to oral doxycycline and later discharged with instructions for twice-daily dressing changes and an outpatient follow-up with Gen. surgery.  Interval history: He has a family member who has been performing dressing changes for him and he has been compliant with doxycycline. He does not do much talking but the family member does. She tells me the patient complains of persisting itching of the back and she has used over-the-counter hydrocortisone cream with no response. She also informs me she checks his blood sugars for him and administers his insulin and fasting blood sugars range from 140-250; he has been out of insulin for only 4 days. Patient has No headache, No chest pain, No abdominal pain - No Nausea, No new weakness tingling or numbness, No Cough - SOB.  No Known Allergies Past Medical History  Diagnosis Date  . Diabetes mellitus   . HTN (hypertension)    Current Outpatient Prescriptions on File Prior to Visit  Medication Sig Dispense Refill  . docusate sodium (COLACE) 100 MG capsule Take 1 capsule (100 mg total) by mouth 2 (two) times daily as needed for mild constipation. 30 capsule 0  . doxycycline (VIBRAMYCIN) 100 MG capsule Take 1 capsule (100 mg total) by mouth 2 (two) times daily. X 10days 20 capsule 0  . HYDROcodone-acetaminophen  (NORCO/VICODIN) 5-325 MG tablet Take 1-2 tablets by mouth every 6 (six) hours as needed for moderate pain or severe pain. 20 tablet 0  . ibuprofen (ADVIL,MOTRIN) 600 MG tablet Take 1 tablet (600 mg total) by mouth every 6 (six) hours as needed. (Patient taking differently: Take 600 mg by mouth daily as needed (pain). ) 30 tablet 0  . insulin aspart (NOVOLOG) 100 UNIT/ML injection INJECT 0.1 MLS (10 UNITS TOTAL) INTO THE SKIN 3 TIMES DAILY WITH MEALS (Patient taking differently: Inject 12 Units into the skin 3 (three) times daily with meals. ) 10 mL 3  . Insulin Pen Needle 31G X 5 MM MISC Check blood sugar TID & QHS 100 each 2  . Insulin Syringe-Needle U-100 (INSULIN SYRINGE 1CC/31GX5/16") 31G X 5/16" 1 ML MISC Check blood sugar TID & QHS 100 each 2  . methocarbamol (ROBAXIN) 500 MG tablet Take 1 tablet (500 mg total) by mouth 2 (two) times daily. 20 tablet 0  . naproxen (NAPROSYN) 500 MG tablet Take 1 tablet (500 mg total) by mouth 2 (two) times daily. (Patient taking differently: Take 500 mg by mouth 2 (two) times daily as needed (pain). ) 30 tablet 0  . naproxen sodium (ALEVE) 220 MG tablet Take 440 mg by mouth at bedtime as needed.     No current facility-administered medications on file prior to visit.   Family History  Problem Relation Age of Onset  . Diabetes Mother   . Hypertension Mother   . Diabetes Sister    Social History   Social History  . Marital Status:  Significant Other    Spouse Name: N/A  . Number of Children: N/A  . Years of Education: N/A   Occupational History  . Not on file.   Social History Main Topics  . Smoking status: Never Smoker   . Smokeless tobacco: Not on file  . Alcohol Use: No  . Drug Use: No  . Sexual Activity: Not on file   Other Topics Concern  . Not on file   Social History Narrative    Review of Systems: Constitutional: Negative for fever, chills, diaphoresis, activity change, appetite change and fatigue. HENT: Negative for ear pain,  nosebleeds, congestion, facial swelling, rhinorrhea, neck pain, neck stiffness and ear discharge.  Eyes: Negative for pain, discharge, redness, itching and visual disturbance. Respiratory: Negative for cough, choking, chest tightness, shortness of breath, wheezing and stridor.  Cardiovascular: Negative for chest pain, palpitations and leg swelling. Gastrointestinal: Negative for abdominal distention. Genitourinary: Negative for dysuria, urgency, frequency, hematuria, flank pain, decreased urine volume, difficulty urinating and dyspareunia.  Musculoskeletal: Negative for back pain, joint swelling, arthralgias and gait problem. Neurological: Negative for dizziness, tremors, seizures, syncope, facial asymmetry, speech difficulty, weakness, light-headedness, numbness and headaches.  Skin: See history of present illness Hematological: Negative for adenopathy. Does not bruise/bleed easily. Psychiatric/Behavioral: Negative for hallucinations, behavioral problems, confusion, dysphoric mood, decreased concentration and agitation.    Objective:   Filed Vitals:   10/07/15 0910  BP: 171/99  Pulse: 70  Temp: 98.2 F (36.8 C)  Resp: 68    Physical Exam: Constitutional: Patient appears well-developed and well-nourished. No distress. HENT: Normocephalic, atraumatic, External right and left ear normal. Oropharynx is clear and moist.  Eyes: Conjunctivae and EOM are normal. PERRLA, no scleral icterus. Neck: Normal ROM. Neck supple. No JVD. No tracheal deviation. No thyromegaly. CVS: RRR, S1/S2 +, no murmurs, no gallops, no carotid bruit.  Pulmonary: Effort and breath sounds normal, no stridor, rhonchi, wheezes, rales.  Abdominal: Soft. BS +,  no distension, tenderness, rebound or guarding.  Musculoskeletal: Normal range of motion. No edema and no tenderness.  Lymphadenopathy: No lymphadenopathy noted, cervical, inguinal or axillary Neuro: Alert. Normal reflexes, muscle tone coordination. No cranial  nerve deficit. Skin: Dry skin on entire back solid-phase. Abscess cavity in mid back which is not tender to palpation and contains granulation tissue with no discharge noted mild surrounding induration. Marland Kitchen Psychiatric: Normal mood and affect. Behavior, judgment, thought content normal.  Lab Results  Component Value Date   WBC 5.2 09/29/2015   HGB 10.6* 09/29/2015   HCT 33.4* 09/29/2015   MCV 75.9* 09/29/2015   PLT 270 09/29/2015   Lab Results  Component Value Date   CREATININE 0.75 09/29/2015   BUN 5* 09/29/2015   NA 135 09/29/2015   K 3.5 09/29/2015   CL 101 09/29/2015   CO2 27 09/29/2015    Lab Results  Component Value Date   HGBA1C 13.4* 09/28/2015   Lipid Panel     Component Value Date/Time   CHOL 191 08/21/2013 1204   TRIG 281* 08/21/2013 1204   HDL 42 08/21/2013 1204   CHOLHDL 4.5 08/21/2013 1204   VLDL 56* 08/21/2013 1204   LDLCALC 93 08/21/2013 1204       Assessment and plan:  Mid back abscess: Dressing change performed in the clinic today and abscess cavity packed with iodoform Advised to keep appointment with general surgery on 10/14/15  Type 2 diabetes mellitus with diabetic neuropathy Uncontrolled with A1c of 13.4, CBG of 230 which is fasting. I have increased  his Lantus from 32 units at bedtime to 40 units at bedtime; continue NovoLog sliding scale Continue gabapentin for neuropathy. I will review his blood sugar log at his next visit  Hypertension: Uncontrolled due to not taking antihypertensive this morning. Advised on compliance and I will recheck blood pressure at his next visit.  Dermatitis: He does have some xerosis on his back and have advised him to use good moisturizers. I have placed him on hydrocortisone cream as well.      Arnoldo Morale, [facility] [phone] 10/07/2015, 9:39 AM

## 2015-10-08 LAB — MICROALBUMIN / CREATININE URINE RATIO
CREATININE, URINE: 134 mg/dL (ref 20–370)
MICROALB/CREAT RATIO: 397 ug/mg{creat} — AB (ref <30)
Microalb, Ur: 53.2 mg/dL

## 2015-10-12 ENCOUNTER — Telehealth: Payer: Self-pay

## 2015-10-12 NOTE — Telephone Encounter (Signed)
-----   Message from Arnoldo Morale, MD sent at 10/08/2015  8:49 AM EDT ----- He has microalbuminuria which could be secondary to his DM. Continue lisinopril which should help with this.

## 2015-10-12 NOTE — Telephone Encounter (Signed)
CMA called pt, pt verified name and DOB. Pt was given lab results and asked me to explain to [facility] who's on his contact list. Ms. [name] was given the lab results  As well and verbalized that she understood with no further questions.

## 2015-10-21 ENCOUNTER — Ambulatory Visit: Payer: Self-pay | Admitting: Family Medicine

## 2015-11-04 ENCOUNTER — Ambulatory Visit: Payer: Self-pay | Admitting: Family Medicine

## 2015-11-25 ENCOUNTER — Ambulatory Visit: Payer: Self-pay | Admitting: Family Medicine

## 2015-12-16 MED FILL — GABAPENTIN 400 MG CAPSULE: 400 | 10 days supply | Qty: 30 | Fill #1

## 2015-12-16 MED FILL — TERBINAFINE HCL 250 MG TAB: 250 | 30 days supply | Qty: 30 | Fill #1

## 2015-12-16 MED FILL — LISINOPRIL 5 MG TABLET: 5 | 30 days supply | Qty: 30 | Fill #1

## 2015-12-16 MED FILL — METHOCARBAMOL 500 MG TABLET: 500 | 7 days supply | Qty: 30 | Fill #1

## 2016-02-18 ENCOUNTER — Encounter (HOSPITAL_COMMUNITY): Payer: Self-pay | Admitting: Cardiology

## 2016-02-18 ENCOUNTER — Emergency Department (HOSPITAL_COMMUNITY): Payer: Self-pay

## 2016-02-18 ENCOUNTER — Inpatient Hospital Stay (HOSPITAL_COMMUNITY)
Admission: EM | Admit: 2016-02-18 | Discharge: 2016-02-20 | DRG: 603 | Disposition: A | Discharge status: Home / Self Care | Payer: Self-pay | Attending: Internal Medicine | Admitting: Internal Medicine

## 2016-02-18 DIAGNOSIS — R609 Edema, unspecified: Secondary | ICD-10-CM

## 2016-02-18 DIAGNOSIS — L089 Local infection of the skin and subcutaneous tissue, unspecified: Secondary | ICD-10-CM

## 2016-02-18 DIAGNOSIS — M79673 Pain in unspecified foot: Secondary | ICD-10-CM

## 2016-02-18 DIAGNOSIS — R739 Hyperglycemia, unspecified: Secondary | ICD-10-CM

## 2016-02-18 DIAGNOSIS — I10 Essential (primary) hypertension: Secondary | ICD-10-CM | POA: Diagnosis present

## 2016-02-18 DIAGNOSIS — L03115 Cellulitis of right lower limb: Principal | ICD-10-CM | POA: Diagnosis present

## 2016-02-18 DIAGNOSIS — B9789 Other viral agents as the cause of diseases classified elsewhere: Secondary | ICD-10-CM | POA: Diagnosis present

## 2016-02-18 DIAGNOSIS — L0390 Cellulitis, unspecified: Secondary | ICD-10-CM | POA: Diagnosis present

## 2016-02-18 DIAGNOSIS — E119 Type 2 diabetes mellitus without complications: Secondary | ICD-10-CM

## 2016-02-18 DIAGNOSIS — Z8249 Family history of ischemic heart disease and other diseases of the circulatory system: Secondary | ICD-10-CM

## 2016-02-18 DIAGNOSIS — E11628 Type 2 diabetes mellitus with other skin complications: Secondary | ICD-10-CM | POA: Diagnosis present

## 2016-02-18 DIAGNOSIS — E1165 Type 2 diabetes mellitus with hyperglycemia: Secondary | ICD-10-CM | POA: Diagnosis present

## 2016-02-18 DIAGNOSIS — Z833 Family history of diabetes mellitus: Secondary | ICD-10-CM

## 2016-02-18 LAB — CBC WITH DIFFERENTIAL/PLATELET
BASOS PCT: 0 %
Basophils Absolute: 0 10*3/uL (ref 0.0–0.1)
Eosinophils Absolute: 0 10*3/uL (ref 0.0–0.7)
Eosinophils Relative: 1 %
HCT: 36.2 % — ABNORMAL LOW (ref 39.0–52.0)
HEMOGLOBIN: 11.5 g/dL — AB (ref 13.0–17.0)
LYMPHS ABS: 1.4 10*3/uL (ref 0.7–4.0)
Lymphocytes Relative: 29 %
MCH: 23.9 pg — ABNORMAL LOW (ref 26.0–34.0)
MCHC: 31.8 g/dL (ref 30.0–36.0)
MCV: 75.3 fL — AB (ref 78.0–100.0)
MONOS PCT: 6 %
Monocytes Absolute: 0.3 10*3/uL (ref 0.1–1.0)
NEUTROS ABS: 3.2 10*3/uL (ref 1.7–7.7)
NEUTROS PCT: 64 %
PLATELETS: 288 10*3/uL (ref 150–400)
RBC: 4.81 MIL/uL (ref 4.22–5.81)
RDW: 14.4 % (ref 11.5–15.5)
WBC: 4.9 10*3/uL (ref 4.0–10.5)

## 2016-02-18 LAB — COMPREHENSIVE METABOLIC PANEL
ALBUMIN: 3.1 g/dL — AB (ref 3.5–5.0)
ALT: 13 U/L — ABNORMAL LOW (ref 17–63)
ANION GAP: 12 (ref 5–15)
AST: 16 U/L (ref 15–41)
Alkaline Phosphatase: 104 U/L (ref 38–126)
BUN: 6 mg/dL (ref 6–20)
CHLORIDE: 100 mmol/L — AB (ref 101–111)
CO2: 23 mmol/L (ref 22–32)
Calcium: 9.2 mg/dL (ref 8.9–10.3)
Creatinine, Ser: 0.81 mg/dL (ref 0.61–1.24)
GFR calc Af Amer: >60 mL/min (ref >60)
GFR calc non Af Amer: >60 mL/min (ref >60)
GLUCOSE: 491 mg/dL — AB (ref 65–99)
POTASSIUM: 4.1 mmol/L (ref 3.5–5.1)
Sodium: 135 mmol/L (ref 135–145)
TOTAL PROTEIN: 7.2 g/dL (ref 6.5–8.1)
Total Bilirubin: 0.5 mg/dL (ref 0.3–1.2)

## 2016-02-18 LAB — I-STAT CG4 LACTIC ACID, ED: Lactic Acid, Venous: 1.35 mmol/L (ref 0.5–2.0)

## 2016-02-18 LAB — CBG MONITORING, ED
Glucose-Capillary: 302 mg/dL — ABNORMAL HIGH (ref 65–99)
Glucose-Capillary: 346 mg/dL — ABNORMAL HIGH (ref 65–99)
Glucose-Capillary: 458 mg/dL — ABNORMAL HIGH (ref 65–99)

## 2016-02-18 MED ORDER — INSULIN ASPART 100 UNIT/ML ~~LOC~~ SOLN
10.0000 [IU] | Freq: Once | SUBCUTANEOUS | Status: AC
  Administered 2016-02-18: 10 [IU] via SUBCUTANEOUS
  Filled 2016-02-18: qty 1

## 2016-02-18 MED ORDER — PIPERACILLIN-TAZOBACTAM 3.375 G IVPB 30 MIN
3.3750 g | Freq: Once | INTRAVENOUS | Status: AC
  Administered 2016-02-18: 3.375 g via INTRAVENOUS
  Filled 2016-02-18: qty 50

## 2016-02-18 MED ORDER — SODIUM CHLORIDE 0.9 % IV SOLN
Freq: Once | INTRAVENOUS | Status: AC
  Administered 2016-02-18: 22:00:00 via INTRAVENOUS

## 2016-02-18 MED ORDER — INSULIN GLARGINE 100 UNIT/ML ~~LOC~~ SOLN
36.0000 [IU] | Freq: Once | SUBCUTANEOUS | Status: AC
  Administered 2016-02-19: 36 [IU] via SUBCUTANEOUS
  Filled 2016-02-18: qty 0.36

## 2016-02-18 MED ORDER — SODIUM CHLORIDE 0.9 % IV SOLN
INTRAVENOUS | Status: DC
  Administered 2016-02-18: via INTRAVENOUS

## 2016-02-18 MED ORDER — VANCOMYCIN HCL IN DEXTROSE 1-5 GM/200ML-% IV SOLN
1000.0000 mg | Freq: Once | INTRAVENOUS | Status: AC
  Administered 2016-02-18: 1000 mg via INTRAVENOUS
  Filled 2016-02-18: qty 200

## 2016-02-18 NOTE — ED Notes (Signed)
Pt reports that he started having right leg pain and right foot pain on Monday. States that the toe started bleeding last night. Is a diabetic.

## 2016-02-18 NOTE — ED Notes (Signed)
Patient transported to X-ray 

## 2016-02-18 NOTE — ED Provider Notes (Signed)
CSN: [id number]     Arrival date & time 02/18/16  1339 History   First MD Initiated Contact with Patient 02/18/16 2203     Chief Complaint  Patient presents with  . Foot Pain     (Consider location/radiation/quality/duration/timing/severity/associated sxs/prior Treatment) HPI Comments: Patient presents with 2 day history of right  foot and leg pain is progressively worsening. Noticed some bleeding from his right great toe this evening. He is a diabetic. He did not take his insulin today. He had a fever to 101 yesterday. He's also had a cough over the past week. he's had progressive swelling and redness to his right foot with some bleeding from the medial surface of his right great toe today. Denies any direct trauma. He states he has minimal feeling in his leg at baseline. Denies any vomiting, chest pain or shortness of breath.   The history is provided by the patient.    Past Medical History  Diagnosis Date  . Diabetes mellitus   . HTN (hypertension)    Past Surgical History  Procedure Laterality Date  . Back surgery      for abscess   Family History  Problem Relation Age of Onset  . Diabetes Mother   . Hypertension Mother   . Diabetes Sister    Social History  Substance Use Topics  . Smoking status: Never Smoker   . Smokeless tobacco: None  . Alcohol Use: No    Review of Systems  Constitutional: Negative for fever, activity change, appetite change and fatigue.  HENT: Negative for congestion and rhinorrhea.   Respiratory: Positive for cough. Negative for chest tightness, shortness of breath and wheezing.   Cardiovascular: Negative for chest pain and palpitations.  Gastrointestinal: Negative for nausea, vomiting and abdominal pain.  Genitourinary: Negative for dysuria, urgency, hematuria and testicular pain.  Musculoskeletal: Positive for myalgias and arthralgias.  Skin: Positive for wound. Negative for rash.  Neurological: Negative for dizziness, weakness,  light-headedness and headaches.  A complete 10 system review of systems was obtained and all systems are negative except as noted in the HPI and PMH.      Allergies  Review of patient's allergies indicates no known allergies.  Home Medications   Prior to Admission medications   Medication Sig Start Date End Date Taking? Authorizing Provider  docusate sodium (COLACE) 100 MG capsule Take 1 capsule (100 mg total) by mouth 2 (two) times daily as needed for mild constipation. 09/29/15   Ripudeep Krystal Eaton, MD  doxycycline (VIBRAMYCIN) 100 MG capsule Take 1 capsule (100 mg total) by mouth 2 (two) times daily. X 10days 09/29/15   Ripudeep Krystal Eaton, MD  gabapentin (NEURONTIN) 400 MG capsule Take 1 capsule (400 mg total) by mouth 3 (three) times daily. 10/07/15   Arnoldo Morale, MD  HYDROcodone-acetaminophen (NORCO/VICODIN) 5-325 MG tablet Take 1-2 tablets by mouth every 6 (six) hours as needed for moderate pain or severe pain. 09/29/15   Ripudeep Krystal Eaton, MD  hydrocortisone 2.5 % cream Apply topically 2 (two) times daily. 10/07/15   Arnoldo Morale, MD  ibuprofen (ADVIL,MOTRIN) 600 MG tablet Take 1 tablet (600 mg total) by mouth every 6 (six) hours as needed. Patient taking differently: Take 600 mg by mouth daily as needed (pain).  12/05/14   Julianne Rice, MD  insulin aspart (NOVOLOG) 100 UNIT/ML injection INJECT 0.1 MLS (10 UNITS TOTAL) INTO THE SKIN 3 TIMES DAILY WITH MEALS Patient taking differently: Inject 12 Units into the skin 3 (three) times daily with meals.  07/01/15   Lorayne Marek, MD  insulin aspart (NOVOLOG) 100 UNIT/ML injection Inject 0-15 Units into the skin 3 (three) times daily with meals. Sliding scale  CBG 70 - 120: 0 units: CBG 121 - 150: 2 units; CBG 151 - 200: 3 units; CBG 201 - 250: 5 units; CBG 251 - 300: 8 units;CBG 301 - 350: 11 units; CBG 351 - 400: 15 units; CBG > 400 : 15 units and notify your  MD 10/07/15   Arnoldo Morale, MD  Insulin Glargine (LANTUS SOLOSTAR) 100 UNIT/ML Solostar  Pen Inject 40 Units into the skin daily at 10 pm. 10/07/15   Arnoldo Morale, MD  Insulin Pen Needle 31G X 5 MM MISC Check blood sugar TID & QHS 03/04/15   Lorayne Marek, MD  Insulin Syringe-Needle U-100 (INSULIN SYRINGE 1CC/31GX5/16") 31G X 5/16" 1 ML MISC Check blood sugar TID & QHS 03/04/15   Lorayne Marek, MD  lisinopril (PRINIVIL,ZESTRIL) 5 MG tablet Take 1 tablet (5 mg total) by mouth daily. 10/07/15   Arnoldo Morale, MD  methocarbamol (ROBAXIN) 500 MG tablet Take 1 tablet (500 mg total) by mouth 2 (two) times daily. 09/09/15   Hanna Patel-Mills, PA-C  naproxen (NAPROSYN) 500 MG tablet Take 1 tablet (500 mg total) by mouth 2 (two) times daily. Patient taking differently: Take 500 mg by mouth 2 (two) times daily as needed (pain).  09/09/15   Hanna Patel-Mills, PA-C  naproxen sodium (ALEVE) 220 MG tablet Take 440 mg by mouth at bedtime as needed.    Historical Provider, MD   BP 147/98 mmHg  Pulse 91  Temp(Src) 97.8 F (36.6 C) (Oral)  Resp 18  Wt 184 lb (83.462 kg)  SpO2 99% Physical Exam  Constitutional: He is oriented to person, place, and time. He appears well-developed and well-nourished. No distress.  HENT:  Head: Normocephalic and atraumatic.  Mouth/Throat: Oropharynx is clear and moist. No oropharyngeal exudate.  Eyes: Conjunctivae and EOM are normal. Pupils are equal, round, and reactive to light.  Neck: Normal range of motion. Neck supple.  No meningismus.  Cardiovascular: Normal rate, regular rhythm, normal heart sounds and intact distal pulses.   No murmur heard. Pulmonary/Chest: Effort normal and breath sounds normal. No respiratory distress. He exhibits no tenderness.  Abdominal: Soft. There is no tenderness. There is no rebound and no guarding.  Musculoskeletal: Normal range of motion. He exhibits edema and tenderness.  R foot pain and swelling with erythema.  +2 DP and PT pulses. R great toe ulceration, with bloody drainage.   Neurological: He is alert and oriented to person,  place, and time. No cranial nerve deficit. He exhibits normal muscle tone. Coordination normal.  No ataxia on finger to nose bilaterally. No pronator drift. 5/5 strength throughout. CN 2-12 intact.Equal grip strength. Sensation intact.   Skin: Skin is warm.  Psychiatric: He has a normal mood and affect. His behavior is normal.  Nursing note and vitals reviewed.   ED Course  Procedures (including critical care time) Labs Review Labs Reviewed  CBC WITH DIFFERENTIAL/PLATELET - Abnormal; Notable for the following:    Hemoglobin 11.5 (*)    HCT 36.2 (*)    MCV 75.3 (*)    MCH 23.9 (*)    All other components within normal limits  COMPREHENSIVE METABOLIC PANEL - Abnormal; Notable for the following:    Chloride 100 (*)    Glucose, Bld 491 (*)    Albumin 3.1 (*)    ALT 13 (*)    All other  components within normal limits  CBG MONITORING, ED - Abnormal; Notable for the following:    Glucose-Capillary 458 (*)    All other components within normal limits  CBG MONITORING, ED - Abnormal; Notable for the following:    Glucose-Capillary 346 (*)    All other components within normal limits  CULTURE, BLOOD (ROUTINE X 2)  CULTURE, BLOOD (ROUTINE X 2)  I-STAT CG4 LACTIC ACID, ED    Imaging Review Dg Chest 2 View  02/18/2016  CLINICAL DATA:  Right foot pain for 4 days. Bleeding at the right first toe starting tonight. Open wound. History of diabetes, hypertension, nonsmoker. Cough. EXAM: CHEST  2 VIEW COMPARISON:  12/18/2014 FINDINGS: The heart size and mediastinal contours are within normal limits. Both lungs are clear. The visualized skeletal structures are unremarkable. IMPRESSION: No active cardiopulmonary disease. Electronically Signed   By: Lucienne Capers M.D.   On: 02/18/2016 23:08   Dg Foot Complete Right  02/18/2016  CLINICAL DATA:  Right foot pain for 4 days. Open wound about first toe. EXAM: RIGHT FOOT COMPLETE - 3+ VIEW COMPARISON:  None. FINDINGS: Soft tissue defect about the base of  the first toe. No radiopaque foreign body. No bony destructive change. No fracture or dislocation. The alignment and joint spaces are maintained. Atherosclerosis. IMPRESSION: Soft tissue defect at the base of the first toe. No radiopaque foreign body. No radiographic findings of osteomyelitis. Electronically Signed   By: Jeb Levering M.D.   On: 02/18/2016 23:07   I have personally reviewed and evaluated these images and lab results as part of my medical decision-making.   EKG Interpretation None      MDM   Final diagnoses:  Diabetic foot infection ([place])  Hyperglycemia   Diabetic with 2 day history of pain and swelling to right foot and leg. Wound to right toe that is bleeding intermittently. Febrile yesterday.  Patient in no distress. Intact distal pulses. Patient with some cellulitis and wound infection to right great toe involving forefoot. Intact DP and PT pulses.  Patient started on IV antibiotics and cultures obtained. X-ray shows no evidence of osteomyelitis.  Patient hyperglycemia, not in DKA. He will he be given his regular insulin dose. Admission for IV antibiotics. Discussed with Dr. Hal Hope.    Ezequiel Essex, MD 02/18/16 [phone]

## 2016-02-18 NOTE — ED Notes (Signed)
Attempted to call report

## 2016-02-19 ENCOUNTER — Inpatient Hospital Stay (HOSPITAL_COMMUNITY): Payer: Self-pay

## 2016-02-19 ENCOUNTER — Encounter (HOSPITAL_COMMUNITY): Payer: Self-pay | Admitting: Internal Medicine

## 2016-02-19 DIAGNOSIS — M7989 Other specified soft tissue disorders: Secondary | ICD-10-CM

## 2016-02-19 DIAGNOSIS — L03115 Cellulitis of right lower limb: Principal | ICD-10-CM

## 2016-02-19 DIAGNOSIS — E1165 Type 2 diabetes mellitus with hyperglycemia: Secondary | ICD-10-CM

## 2016-02-19 LAB — CBC WITH DIFFERENTIAL/PLATELET
BASOS ABS: 0 10*3/uL (ref 0.0–0.1)
Basophils Relative: 0 %
Eosinophils Absolute: 0.1 10*3/uL (ref 0.0–0.7)
Eosinophils Relative: 1 %
HEMATOCRIT: 34.8 % — AB (ref 39.0–52.0)
HEMOGLOBIN: 10.8 g/dL — AB (ref 13.0–17.0)
LYMPHS PCT: 34 %
Lymphs Abs: 1.5 10*3/uL (ref 0.7–4.0)
MCH: 23.4 pg — ABNORMAL LOW (ref 26.0–34.0)
MCHC: 31 g/dL (ref 30.0–36.0)
MCV: 75.3 fL — AB (ref 78.0–100.0)
MONO ABS: 0.3 10*3/uL (ref 0.1–1.0)
Monocytes Relative: 6 %
Neutro Abs: 2.5 10*3/uL (ref 1.7–7.7)
Neutrophils Relative %: 59 %
Platelets: 263 10*3/uL (ref 150–400)
RBC: 4.62 MIL/uL (ref 4.22–5.81)
RDW: 14.6 % (ref 11.5–15.5)
WBC: 4.3 10*3/uL (ref 4.0–10.5)

## 2016-02-19 LAB — COMPREHENSIVE METABOLIC PANEL
ALBUMIN: 2.7 g/dL — AB (ref 3.5–5.0)
ALK PHOS: 90 U/L (ref 38–126)
ALT: 10 U/L — AB (ref 17–63)
AST: 11 U/L — AB (ref 15–41)
Anion gap: 5 (ref 5–15)
BILIRUBIN TOTAL: 0.3 mg/dL (ref 0.3–1.2)
BUN: 6 mg/dL (ref 6–20)
CO2: 24 mmol/L (ref 22–32)
CREATININE: 0.64 mg/dL (ref 0.61–1.24)
Calcium: 8.4 mg/dL — ABNORMAL LOW (ref 8.9–10.3)
Chloride: 106 mmol/L (ref 101–111)
GFR calc Af Amer: >60 mL/min (ref >60)
GLUCOSE: 276 mg/dL — AB (ref 65–99)
Potassium: 3.3 mmol/L — ABNORMAL LOW (ref 3.5–5.1)
Sodium: 135 mmol/L (ref 135–145)
TOTAL PROTEIN: 6.5 g/dL (ref 6.5–8.1)

## 2016-02-19 LAB — GLUCOSE, CAPILLARY
GLUCOSE-CAPILLARY: 230 mg/dL — AB (ref 65–99)
GLUCOSE-CAPILLARY: 193 mg/dL — AB (ref 65–99)
GLUCOSE-CAPILLARY: 184 mg/dL — AB (ref 65–99)
Glucose-Capillary: 291 mg/dL — ABNORMAL HIGH (ref 65–99)
Glucose-Capillary: 225 mg/dL — ABNORMAL HIGH (ref 65–99)

## 2016-02-19 LAB — SEDIMENTATION RATE: Sed Rate: 67 mm/hr — ABNORMAL HIGH (ref 0–16)

## 2016-02-19 LAB — URIC ACID: Uric Acid, Serum: 4.5 mg/dL (ref 4.4–7.6)

## 2016-02-19 MED ORDER — ACETAMINOPHEN 325 MG PO TABS
650.0000 mg | ORAL_TABLET | Freq: Four times a day (QID) | ORAL | Status: DC | PRN
Start: 2016-02-19 — End: 2016-02-20

## 2016-02-19 MED ORDER — INSULIN GLARGINE 100 UNIT/ML ~~LOC~~ SOLN
40.0000 [IU] | Freq: Every day | SUBCUTANEOUS | Status: DC
  Administered 2016-02-19: 40 [IU] via SUBCUTANEOUS
  Filled 2016-02-19 (×2): qty 0.4

## 2016-02-19 MED ORDER — LISINOPRIL 5 MG PO TABS
5.0000 mg | ORAL_TABLET | Freq: Every day | ORAL | Status: DC
  Administered 2016-02-19 – 2016-02-20 (×2): 5 mg via ORAL
  Filled 2016-02-19 (×2): qty 1

## 2016-02-19 MED ORDER — METHOCARBAMOL 500 MG PO TABS
500.0000 mg | ORAL_TABLET | Freq: Two times a day (BID) | ORAL | Status: DC
  Administered 2016-02-19 – 2016-02-20 (×4): 500 mg via ORAL
  Filled 2016-02-19 (×4): qty 1

## 2016-02-19 MED ORDER — VANCOMYCIN HCL IN DEXTROSE 1-5 GM/200ML-% IV SOLN
1000.0000 mg | Freq: Three times a day (TID) | INTRAVENOUS | Status: DC
  Administered 2016-02-19 – 2016-02-20 (×5): 1000 mg via INTRAVENOUS
  Filled 2016-02-19 (×8): qty 200

## 2016-02-19 MED ORDER — ONDANSETRON HCL 4 MG/2ML IJ SOLN: 4.0000 mg | Freq: Four times a day (QID) | INTRAMUSCULAR | Status: DC | PRN

## 2016-02-19 MED ORDER — INSULIN ASPART 100 UNIT/ML ~~LOC~~ SOLN
0.0000 [IU] | Freq: Three times a day (TID) | SUBCUTANEOUS | Status: DC
Start: 2016-02-19 — End: 2016-02-20
  Administered 2016-02-19: 2 [IU] via SUBCUTANEOUS
  Administered 2016-02-19: 3 [IU] via SUBCUTANEOUS
  Administered 2016-02-19 – 2016-02-20 (×2): 2 [IU] via SUBCUTANEOUS
  Administered 2016-02-20 (×2): 3 [IU] via SUBCUTANEOUS

## 2016-02-19 MED ORDER — GABAPENTIN 400 MG PO CAPS
400.0000 mg | ORAL_CAPSULE | Freq: Three times a day (TID) | ORAL | Status: DC
  Administered 2016-02-19 – 2016-02-20 (×6): 400 mg via ORAL
  Filled 2016-02-19 (×6): qty 1

## 2016-02-19 MED ORDER — PIPERACILLIN-TAZOBACTAM 3.375 G IVPB
3.3750 g | Freq: Three times a day (TID) | INTRAVENOUS | Status: DC
  Administered 2016-02-19 – 2016-02-20 (×4): 3.375 g via INTRAVENOUS
  Filled 2016-02-19 (×6): qty 50

## 2016-02-19 MED ORDER — MUPIROCIN CALCIUM 2 % EX CREA
TOPICAL_CREAM | Freq: Every day | CUTANEOUS | Status: DC
  Administered 2016-02-19 – 2016-02-20 (×2): 1 via TOPICAL
  Filled 2016-02-19: qty 15

## 2016-02-19 MED ORDER — ACETAMINOPHEN 650 MG RE SUPP: 650.0000 mg | Freq: Four times a day (QID) | RECTAL | Status: DC | PRN

## 2016-02-19 MED ORDER — HYDROCODONE-ACETAMINOPHEN 5-325 MG PO TABS
1.0000 | ORAL_TABLET | Freq: Four times a day (QID) | ORAL | Status: DC | PRN
Start: 2016-02-19 — End: 2016-02-20
  Administered 2016-02-19 – 2016-02-20 (×3): 2 via ORAL
  Filled 2016-02-19 (×3): qty 2

## 2016-02-19 MED ORDER — ONDANSETRON HCL 4 MG PO TABS: 4.0000 mg | ORAL_TABLET | Freq: Four times a day (QID) | ORAL | Status: DC | PRN

## 2016-02-19 MED ORDER — DOCUSATE SODIUM 100 MG PO CAPS: 100.0000 mg | ORAL_CAPSULE | Freq: Two times a day (BID) | ORAL | Status: DC | PRN

## 2016-02-19 MED ORDER — GUAIFENESIN-DM 100-10 MG/5ML PO SYRP
5.0000 mL | ORAL_SOLUTION | ORAL | Status: DC | PRN
  Administered 2016-02-19 – 2016-02-20 (×2): 5 mL via ORAL
  Filled 2016-02-19 (×2): qty 5

## 2016-02-19 MED ORDER — SODIUM CHLORIDE 0.9 % IV SOLN
INTRAVENOUS | Status: AC
  Administered 2016-02-19 (×2): via INTRAVENOUS

## 2016-02-19 NOTE — H&P (Signed)
[facility] History and Physical  Jeffrey [name] [id number] DOB: [birth year] DOA: 02/18/2016  Referring physician: ER physician. PCP: Lorayne Marek, MD  Specialists: None.  Chief Complaint: Right foot swelling and pain.  HPI: Jeffrey [name] is a 50 y.o. male with history of diabetes mellitus type 2, hypertension presents to the ER because of increasing swelling and pain in the right foot. Patient states he has chronic wound on the right great toe which started bleeding last few days ago. Denies any trauma or insect bite. Over the last 3-4 days patient noticed increasing swelling in the right foot with pain. Denies any fever or chills. The ER patient on exam has a right foot swollen with a right great toe having ulceration. X-rays do not show any bony involvement. Patient has been admitted for possible cellulitis of the right foot. Patient blood sugar is around 400 but not in DKA. As per PCPs notes patient's hemoglobin A1c in October was around 13.   Review of Systems: As presented in the history of presenting illness, rest negative.  Past Medical History  Diagnosis Date  . Diabetes mellitus   . HTN (hypertension)    Past Surgical History  Procedure Laterality Date  . Back surgery      for abscess   Social History:  reports that he has never smoked. He does not have any smokeless tobacco history on file. He reports that he does not drink alcohol or use illicit drugs. Where does patient live home. Can patient participate in ADLs? Yes.  No Known Allergies  Family History:  Family History  Problem Relation Age of Onset  . Diabetes Mother   . Hypertension Mother   . Diabetes Sister       Prior to Admission medications   Medication Sig Start Date End Date Taking? Authorizing Provider  docusate sodium (COLACE) 100 MG capsule Take 1 capsule (100 mg total) by mouth 2 (two) times daily as needed for mild constipation. 09/29/15   Ripudeep Krystal Eaton, MD  doxycycline  (VIBRAMYCIN) 100 MG capsule Take 1 capsule (100 mg total) by mouth 2 (two) times daily. X 10days 09/29/15   Ripudeep Krystal Eaton, MD  gabapentin (NEURONTIN) 400 MG capsule Take 1 capsule (400 mg total) by mouth 3 (three) times daily. 10/07/15   Arnoldo Morale, MD  HYDROcodone-acetaminophen (NORCO/VICODIN) 5-325 MG tablet Take 1-2 tablets by mouth every 6 (six) hours as needed for moderate pain or severe pain. 09/29/15   Ripudeep Krystal Eaton, MD  hydrocortisone 2.5 % cream Apply topically 2 (two) times daily. 10/07/15   Arnoldo Morale, MD  ibuprofen (ADVIL,MOTRIN) 600 MG tablet Take 1 tablet (600 mg total) by mouth every 6 (six) hours as needed. Patient taking differently: Take 600 mg by mouth daily as needed (pain).  12/05/14   Julianne Rice, MD  insulin aspart (NOVOLOG) 100 UNIT/ML injection INJECT 0.1 MLS (10 UNITS TOTAL) INTO THE SKIN 3 TIMES DAILY WITH MEALS Patient taking differently: Inject 12 Units into the skin 3 (three) times daily with meals.  07/01/15   Lorayne Marek, MD  insulin aspart (NOVOLOG) 100 UNIT/ML injection Inject 0-15 Units into the skin 3 (three) times daily with meals. Sliding scale  CBG 70 - 120: 0 units: CBG 121 - 150: 2 units; CBG 151 - 200: 3 units; CBG 201 - 250: 5 units; CBG 251 - 300: 8 units;CBG 301 - 350: 11 units; CBG 351 - 400: 15 units; CBG > 400 : 15 units and notify your  MD  10/07/15   Arnoldo Morale, MD  Insulin Glargine (LANTUS SOLOSTAR) 100 UNIT/ML Solostar Pen Inject 40 Units into the skin daily at 10 pm. 10/07/15   Arnoldo Morale, MD  Insulin Pen Needle 31G X 5 MM MISC Check blood sugar TID & QHS 03/04/15   Lorayne Marek, MD  Insulin Syringe-Needle U-100 (INSULIN SYRINGE 1CC/31GX5/16") 31G X 5/16" 1 ML MISC Check blood sugar TID & QHS 03/04/15   Lorayne Marek, MD  lisinopril (PRINIVIL,ZESTRIL) 5 MG tablet Take 1 tablet (5 mg total) by mouth daily. 10/07/15   Arnoldo Morale, MD  methocarbamol (ROBAXIN) 500 MG tablet Take 1 tablet (500 mg total) by mouth 2 (two) times daily. 09/09/15    Hanna Patel-Mills, PA-C  naproxen (NAPROSYN) 500 MG tablet Take 1 tablet (500 mg total) by mouth 2 (two) times daily. Patient taking differently: Take 500 mg by mouth 2 (two) times daily as needed (pain).  09/09/15   Hanna Patel-Mills, PA-C  naproxen sodium (ALEVE) 220 MG tablet Take 440 mg by mouth at bedtime as needed.    Historical Provider, MD    Physical Exam: Filed Vitals:   02/18/16 1410 02/18/16 1907 02/18/16 2343  BP: 155/91 147/98 143/78  Pulse: 98 91 84  Temp: 97.8 F (36.6 C)    TempSrc: Oral    Resp:  18 14  Weight: 184 lb (83.462 kg)    SpO2: 99% 99% 98%     General:  Moderately built and nourished.  Eyes: Anicteric no pallor.  ENT: No discharge from the ears eyes nose or mouth.  Neck: No mass felt.  Cardiovascular: S1 and S2 heard.  Respiratory: No rhonchi or crepitations.  Abdomen: Soft nontender bowel sounds present.  Skin: Right foot great toe as ulceration with foot erythematous and skin lesions.  Musculoskeletal: See skin description.  Psychiatric: Appears normal.  Neurologic: Alert awake oriented to time place and person. Moves all extremities.  Labs on Admission:  Basic Metabolic Panel:  Recent Labs Lab 02/18/16 1415  NA 135  K 4.1  CL 100*  CO2 23  GLUCOSE 491*  BUN 6  CREATININE 0.81  CALCIUM 9.2   Liver Function Tests:  Recent Labs Lab 02/18/16 1415  AST 16  ALT 13*  ALKPHOS 104  BILITOT 0.5  PROT 7.2  ALBUMIN 3.1*   No results for input(s): LIPASE, AMYLASE in the last 168 hours. No results for input(s): AMMONIA in the last 168 hours. CBC:  Recent Labs Lab 02/18/16 1415  WBC 4.9  NEUTROABS 3.2  HGB 11.5*  HCT 36.2*  MCV 75.3*  PLT 288   Cardiac Enzymes: No results for input(s): CKTOTAL, CKMB, CKMBINDEX, TROPONINI in the last 168 hours.  BNP (last 3 results) No results for input(s): BNP in the last 8760 hours.  ProBNP (last 3 results) No results for input(s): PROBNP in the last 8760  hours.  CBG:  Recent Labs Lab 02/18/16 1420 02/18/16 2156 02/18/16 2351 02/19/16 0107  GLUCAP 458* 346* 302* 291*    Radiological Exams on Admission: Dg Chest 2 View  02/18/2016  CLINICAL DATA:  Right foot pain for 4 days. Bleeding at the right first toe starting tonight. Open wound. History of diabetes, hypertension, nonsmoker. Cough. EXAM: CHEST  2 VIEW COMPARISON:  12/18/2014 FINDINGS: The heart size and mediastinal contours are within normal limits. Both lungs are clear. The visualized skeletal structures are unremarkable. IMPRESSION: No active cardiopulmonary disease. Electronically Signed   By: Lucienne Capers M.D.   On: 02/18/2016 23:08   Dg  Foot Complete Right  02/18/2016  CLINICAL DATA:  Right foot pain for 4 days. Open wound about first toe. EXAM: RIGHT FOOT COMPLETE - 3+ VIEW COMPARISON:  None. FINDINGS: Soft tissue defect about the base of the first toe. No radiopaque foreign body. No bony destructive change. No fracture or dislocation. The alignment and joint spaces are maintained. Atherosclerosis. IMPRESSION: Soft tissue defect at the base of the first toe. No radiopaque foreign body. No radiographic findings of osteomyelitis. Electronically Signed   By: Jeb Levering M.D.   On: 02/18/2016 23:07     Assessment/Plan Active Problems:   HTN (hypertension)   Cellulitis   Diabetic foot infection ([place])   Cellulitis of right foot   Uncontrolled type 2 diabetes mellitus with hyperglycemia ([place])   1. Right foot cellulitis with right great toe ulceration diabetic foot infection - check MRI of the right foot to rule out any osteomyelitis. Follow blood cultures sedimentation rate. Check uric acid levels. Patient has been empirically placed on vancomycin and Zosyn for now. Check ABI and Dopplers. 2. Diabetes mellitus type 2 uncontrolled with hyperglycemia - patient was given home dose of Lantus 36 units on admission along with NovoLog insulin. Patient is also receiving IV fluids.  Closely follow CBGs. 3. Hypertension - appears to be controlled at this time will continue home medications.  Patient is a poor historian.   DVT Prophylaxis Lovenox.  Code Status: Full code.  Family Communication: Discussed with patient.  Disposition Plan: Admit to inpatient.    Syla Devoss N. [facility] Pager [phone].  If 7PM-7AM, please contact night-coverage [email or web address] Password [email or web address] 02/19/2016, 1:30 AM

## 2016-02-19 NOTE — Progress Notes (Signed)
Inpatient Diabetes Program Recommendations  AACE/ADA: New Consensus Statement on Inpatient Glycemic Control (2015)  Target Ranges:  Prepandial:   less than 140 mg/dL      Peak postprandial:   less than 180 mg/dL (1-2 hours)      Critically ill patients:  140 - 180 mg/dL  Results for WINSLOW, ARTIAGA (MRN [id number]) as of 02/19/2016 11:18  Ref. Range 02/18/2016 14:20 02/18/2016 21:56 02/18/2016 23:51 02/19/2016 01:07 02/19/2016 08:02  Glucose-Capillary Latest Ref Range: 65-99 mg/dL 458 (H) 346 (H) 302 (H) 291 (H) 230 (H)   Review of Glycemic Control  Diabetes history: DM2 Outpatient Diabetes medications: Lantus 40 units QHS, Novolog 0-15 units TID with meals Current orders for Inpatient glycemic control: Lantus 40 units QHS, Novolog 0-9 units TID with meals  Inpatient Diabetes Program Recommendations: Insulin - Basal: Noted Lantus was increased from 36 to 40 units today. Insulin - Meal Coverage: Please consider ordering Novolog 5 units TID with meals for meal coverage (in addition to Novolog correction scale). Insulin-Correction:Please consider ordering Novolog bedtime correction scale.  Thanks, Barnie Alderman, RN, MSN, CDE Diabetes Coordinator Inpatient Diabetes Program [phone] (Team Pager from [place] to [place]) [phone] ([place] office) [phone] [facility] office) [phone] [facility] office)

## 2016-02-19 NOTE — Progress Notes (Addendum)
*  Preliminary Results* Right lower extremity venous duplex completed. Right lower extremity is negative for deep vein thrombosis. There is no evidence of right Baker's cyst.  In consideration of the ABI that was also ordered, the distal right posterior tibial artery as well as the dorsalis pedis artery were evaluated. Both are patent with antegrade flow. Per Dr. [place] Pink, no need to complete the ABI at this time.  Incidental finding: There is a heterogenous area of the right groin measuring 3cm, suggestive of a possible enlarged lymph node.  02/19/2016 4:11 PM  Maudry Mayhew, RVT, RDCS, RDMS

## 2016-02-19 NOTE — Progress Notes (Signed)
   Follow Up Note  HPI: 50 year old male with past medical history of very poorly controlled diabetes who has a chronic wound on his right great toe which started bleeding a few days ago with increased swelling and pain. Patient admitted for evaluation of osteomyelitis Pt admitted earlier this morning.  Seen after arrived to floor.    Exam: CV: Regular rate and rhythm, S1-S2 Lungs: Clear to auscultation bilaterally Abd: Soft, nontender, nondistended with positive bowel sounds Ext: Right toe swollen, mildly tender  Present on Admission:  . Diabetic foot infection [facility]): ABIs note good peripheral flow. Negative MRI for osteomyelitis.  . Cellulitis of right foot: Continue antibiotics, changed to oral tomorrow  . HTN (hypertension) . Uncontrolled type 2 diabetes mellitus with hyperglycemia ([place]): CBGs much improved just by starting home doses of insulin. Patient and wife insist he has been taking his insulin.    Disposition: Anticipate discharge tomorrow.

## 2016-02-19 NOTE — ED Notes (Signed)
Pharmacist notified on pt.'s Lantus insulin order.  

## 2016-02-19 NOTE — Progress Notes (Signed)
Pharmacy Antibiotic Note  Jeffrey [name] is a 50 y.o. male admitted on 02/18/2016 with R foot cellulitis.  Pharmacy has been consulted for Vancomycin and Zosyn dosing. Pt received Vanc 1gm and Zosyn 3.375gm in ED ~2230.  Plan: Zosyn 3.375gm IV q8h - doses over 4 hours Vancomycin 1gm IV q8h Will f/u micro data, renal function, and pt's clinical condition Vanc trough prn  Weight: 184 lb (83.462 kg)  Temp (24hrs), Avg:97.8 F (36.6 C), Min:97.8 F (36.6 C), Max:97.8 F (36.6 C)   Recent Labs Lab 02/18/16 1415 02/18/16 2242  WBC 4.9  --   CREATININE 0.81  --   LATICACIDVEN  --  1.35    Estimated Creatinine Clearance: 112.8 mL/min (by C-G formula based on Cr of 0.81).    No Known Allergies  Antimicrobials this admission: 3/9 Zosyn >> 3/9 Vanc >>   Dose adjustments this admission: n/a  Microbiology results: 3/9 BCx:   Thank you for allowing pharmacy to be a part of this patient's care.  Sherlon Handing, PharmD, BCPS Clinical pharmacist, pager [phone] 02/19/2016 1:38 AM

## 2016-02-19 NOTE — Progress Notes (Signed)
Pt off unit to vascular. P. Amo Phelan Schadt. RN 

## 2016-02-19 NOTE — Care Management (Signed)
Utilization review completed. Elex Mainwaring, RN Case Manager [phone]. 

## 2016-02-19 NOTE — Consult Note (Addendum)
WOC wound consult note Reason for Consult: Consult requested for right great toe; MRI indicates: Abnormal edema in the second metatarsal shaft, nonspecific,possibly from stress fracture or osteomyelitis. No definite osteomyelitis of the toes. Wound type: Full thickness Pressure Ulcer POA: Yes/No Measurement: Tip of right great toe is affected; 4X4cm Wound bed: Pink and moist where previous blister has evolved into full thickness tissue loss Drainage (amount, consistency, odor) Small amt pink drainage, no odor Periwound: Loose peeling skin, black nailbed Dressing procedure/placement/frequency: Pt is on systemic antibiotics.  Bactroban to promote moist healing and antimicrobial benefits.  Discussed plan of care with patient and family at bedside and they verbalize understanding.  If wound does begin to show improvement after 48 hours, then please consider ortho consult   Please re-consult if further assistance is needed.  Thank-you,  Julien Girt MSN, [place], [place], [place], [place]

## 2016-02-20 DIAGNOSIS — L089 Local infection of the skin and subcutaneous tissue, unspecified: Secondary | ICD-10-CM

## 2016-02-20 DIAGNOSIS — I10 Essential (primary) hypertension: Secondary | ICD-10-CM

## 2016-02-20 DIAGNOSIS — E1169 Type 2 diabetes mellitus with other specified complication: Secondary | ICD-10-CM

## 2016-02-20 DIAGNOSIS — Z794 Long term (current) use of insulin: Secondary | ICD-10-CM

## 2016-02-20 LAB — GLUCOSE, CAPILLARY
GLUCOSE-CAPILLARY: 227 mg/dL — AB (ref 65–99)
GLUCOSE-CAPILLARY: 216 mg/dL — AB (ref 65–99)
Glucose-Capillary: 162 mg/dL — ABNORMAL HIGH (ref 65–99)

## 2016-02-20 MED ORDER — INSULIN GLARGINE 100 UNIT/ML SOLOSTAR PEN: 44.0000 [IU] | PEN_INJECTOR | Freq: Every day | SUBCUTANEOUS | Status: DC

## 2016-02-20 MED ORDER — CEPHALEXIN 500 MG PO CAPS: 500.0000 mg | ORAL_CAPSULE | Freq: Four times a day (QID) | ORAL | Status: AC

## 2016-02-20 MED ORDER — MUPIROCIN CALCIUM 2 % EX CREA: TOPICAL_CREAM | Freq: Every day | CUTANEOUS | Status: DC

## 2016-02-20 MED ORDER — INSULIN GLARGINE 100 UNIT/ML ~~LOC~~ SOLN
5.0000 [IU] | SUBCUTANEOUS | Status: AC
  Administered 2016-02-20: 5 [IU] via SUBCUTANEOUS
  Filled 2016-02-20: qty 0.05

## 2016-02-20 MED ORDER — HYDROCODONE-ACETAMINOPHEN 5-325 MG PO TABS: 1.0000 | ORAL_TABLET | Freq: Four times a day (QID) | ORAL | Status: DC | PRN

## 2016-02-20 NOTE — Progress Notes (Signed)
Pt discharge education and instructions completed with pt and spouse at bedside; both voices understanding and denies any questions. Pt IV removed; pt foot dsg remains intact; pt handed his prescriptions for vicodin and mupirocin. Pt waiting on his ordered foot boot to go home with. Pt discharge home with spouse to transport him home. Delia Heady RN

## 2016-02-20 NOTE — Discharge Summary (Addendum)
Discharge Summary  Jeffrey [name] [id number] DOB: 09-Dec-1966  PCP: Lorayne Marek, MD  Admit date: 02/18/2016 Discharge date: 02/20/2016  Time spent: 25 minutes   Recommendations for Outpatient Follow-up:  1. New medication: Keflex 500 mg by mouth 4 times a day for the next 6 days 2. New medication: Vicodin every 6 hours when necessary 3. Medication change: Lantus increased to 44 units from 40 units daily at bedtime 4. Medication/wound care: Apply Bactroban to toe daily then cover with foam dressing. Change foam dressing every 5 days. 5. Medication change: [name] wife requested that he discontinue lisinopril. I explained to her that this medication was protective of his kidneys, but she had concerns that it was not well controlling his blood pressure in African-Americans and she was also ordered about angioedema as she suffered from that and suggested a different medication.  After discussion, plan will be to continue lisinopril and at patient's follow-up appointment this Wednesday, to discuss if additional blood pressure medicine needed. 6. Medication change: Recommended to stop Naprosyn when necessary as he is already taking Motrin when necessary.  Discharge Diagnoses:  Active Hospital Problems   Diagnosis Date Noted  . Cellulitis of right foot 02/19/2016  . Uncontrolled type 2 diabetes mellitus with hyperglycemia ([place]) 02/19/2016  . Diabetic foot infection ([place]) 02/18/2016  . Cellulitis 09/25/2015  . HTN (hypertension) 08/21/2013    Resolved Hospital Problems   Diagnosis Date Noted Date Resolved  No resolved problems to display.    Discharge Condition: Improved, being discharged home   Diet recommendation: Carb modified, low sodium   Filed Weights   02/18/16 1410  Weight: 83.462 kg (184 lb)    History of present illness:  Patient is a 50 year old male with past history of hypertension and very poorly controlled diabetes who has had a chronic wound on his right  great toe which started bleeding a few days prior and started having increased swelling and pain so he was brought in on the early morning of 3/10 for evaluation of osteomyelitis. CBGs on admission noted to be in the 400s, although patient not in DKA  Hospital Course:  Active Problems:   HTN (hypertension): Blood pressure stable    Cellulitis of right foot/diabetic foot infection:  MRI of foot noted no evidence of osteomyelitis. Patient underwent lower extremity Doppler negative for DVT of right lower extremity. In consideration of ABI that was initially ordered, distal right posterior tibial artery as well as dorsalis pedis artery were evaluated and found to be patent with full flow. After discussion with vascular lab, ABI not needed as patient had good vascular flow. Patient seen by wound care who recommended Bactroban to toe and cover with foam dressing daily.  Patient initially put on IV antibiotics and by 3/11, feeling better. Discharged out on Keflex 4 times a day for the next 6 days.    Uncontrolled type 2 diabetes mellitus with hyperglycemia ([place]): CBGs initially in the 400s. Patient started on sliding scale plus Lantus. By continuing his home dose, CBGs automatically in the 250s. Last A1c in October at 13.4. Discussed with patient and wife who insisted patient does take Lantus every single day. Increase dose to 44 units daily at bedtime    Procedures:  Right lower extremity Doppler: No evidence of DVT. Distal arteries patent    Consultations:  None    Discharge Exam: BP 123/92 mmHg  Pulse 80  Temp(Src) 98.4 F (36.9 C) (Oral)  Resp 18  Ht 5\' 8"  (1.727 m)  Wt  83.462 kg (184 lb)  SpO2 99%  General: alert and oriented 3  Cardiovascular: regular rate and rhythm, S1-S2   Respiratory: Clear to auscultation bilaterally    Discharge Instructions You were cared for by a hospitalist during your hospital stay. If you have any questions about your discharge medications or the care you  received while you were in the hospital after you are discharged, you can call the unit and asked to speak with the hospitalist on call if the hospitalist that took care of you is not available. Once you are discharged, your primary care physician will handle any further medical issues. Please note that NO REFILLS for any discharge medications will be authorized once you are discharged, as it is imperative that you return to your primary care physician (or establish a relationship with a primary care physician if you do not have one) for your aftercare needs so that they can reassess your need for medications and monitor your lab values.  Discharge Instructions    Diet - low sodium heart healthy    Complete by:  As directed      Discharge wound care:    Complete by:  As directed   Once a day, bactroban to wound & cover with foam dressing.  Change the foam dressing every 5 days     Increase activity slowly    Complete by:  As directed             Medication List    STOP taking these medications               naproxen 500 MG tablet  Commonly known as:  NAPROSYN      TAKE these medications        cephALEXin 500 MG capsule  Commonly known as:  KEFLEX  Take 1 capsule (500 mg total) by mouth 4 (four) times daily.     docusate sodium 100 MG capsule  Commonly known as:  COLACE  Take 1 capsule (100 mg total) by mouth 2 (two) times daily as needed for mild constipation.     gabapentin 400 MG capsule  Commonly known as:  NEURONTIN  Take 1 capsule (400 mg total) by mouth 3 (three) times daily.     HYDROcodone-acetaminophen 5-325 MG tablet  Commonly known as:  NORCO/VICODIN  Take 1-2 tablets by mouth every 6 (six) hours as needed for moderate pain or severe pain.     ibuprofen 600 MG tablet  Commonly known as:  ADVIL,MOTRIN  Take 1 tablet (600 mg total) by mouth every 6 (six) hours as needed.     insulin aspart 100 UNIT/ML injection  Commonly known as:  novoLOG  Inject 0-15 Units  into the skin 3 (three) times daily with meals. Sliding scale  CBG 70 - 120: 0 units: CBG 121 - 150: 2 units; CBG 151 - 200: 3 units; CBG 201 - 250: 5 units; CBG 251 - 300: 8 units;CBG 301 - 350: 11 units; CBG 351 - 400: 15 units; CBG > 400 : 15 units and notify your  MD     Insulin Glargine 100 UNIT/ML Solostar Pen  Commonly known as:  LANTUS SOLOSTAR  Inject 44 Units into the skin daily at 10 pm.     Insulin Pen Needle 31G X 5 MM Misc  Check blood sugar TID & QHS     INSULIN SYRINGE 1CC/31GX5/16" 31G X 5/16" 1 ML Misc  Check blood sugar TID & QHS     methocarbamol  500 MG tablet  Commonly known as:  ROBAXIN  Take 1 tablet (500 mg total) by mouth 2 (two) times daily.     mupirocin cream 2 %  Commonly known as:  BACTROBAN  Apply topically daily. Apply Bactroban to right great toe once a day and cover with foam dressing.  (Change foam dressing every 5 days or sooner if needed if dirty.)       No Known Allergies    The results of significant diagnostics from this hospitalization (including imaging, microbiology, ancillary and laboratory) are listed below for reference.    Significant Diagnostic Studies: Dg Chest 2 View  02/18/2016  CLINICAL DATA:  Right foot pain for 4 days. Bleeding at the right first toe starting tonight. Open wound. History of diabetes, hypertension, nonsmoker. Cough. EXAM: CHEST  2 VIEW COMPARISON:  12/18/2014 FINDINGS: The heart size and mediastinal contours are within normal limits. Both lungs are clear. The visualized skeletal structures are unremarkable. IMPRESSION: No active cardiopulmonary disease. Electronically Signed   By: Lucienne Capers M.D.   On: 02/18/2016 23:08   Mr Foot Right Wo Contrast  02/19/2016  CLINICAL DATA:  Diabetes. Chronic wound on the right great toe. Increased swelling of the right foot. Ulceration along the great toe. EXAM: MRI OF THE RIGHT FOREFOOT WITHOUT CONTRAST TECHNIQUE: Multiplanar, multisequence MR imaging was performed. No  intravenous contrast was administered. COMPARISON:  02/18/2016 FINDINGS: Osteomyelitis protocol MRI of the foot was obtained, to include the entire foot and ankle. This protocol uses a large field of view to cover the entire foot and ankle, and is suitable for assessing bony structures for osteomyelitis. Due to the large field of view and imaging plane choice, this protocol is less sensitive for assessing small structures such as ligamentous structures of the foot and ankle, compared to a dedicated forefoot or dedicated hindfoot exam. Abnormal increased at T2 and reduced T1 signal proximally in the second metatarsal with mild cortical thickening. Subtle edema in the medial and middle cuneiforms in the marrow. There is subcutaneous edema tracking in the dorsum of the foot and along the ankle medially and laterally. Low-level edema tracks along the plantar musculature of the foot. The dorsal edema tracks into the bases of the toes. There is skin thickening in the great toe. No definite osteomyelitis in the toes. Sensitivity mildly reduced due to poor fat saturation in this region. Thickened medial band of the plantar fascia. IMPRESSION: 1. Abnormal edema in the second metatarsal shaft, nonspecific, possibly from stress fracture or osteomyelitis. No definite osteomyelitis of the toes. 2. Dorsal subcutaneous edema tracks back along the ankle, and is nonspecific for stair all edema versus cellulitis. 3. Edema tracks along the plantar musculature, and conceivably could be from mild myositis. 4. Thickened medial band of the plantar fascia suggesting plantar fasciitis. Electronically Signed   By: Van Clines M.D.   On: 02/19/2016 07:31   Dg Foot Complete Right  02/18/2016  CLINICAL DATA:  Right foot pain for 4 days. Open wound about first toe. EXAM: RIGHT FOOT COMPLETE - 3+ VIEW COMPARISON:  None. FINDINGS: Soft tissue defect about the base of the first toe. No radiopaque foreign body. No bony destructive change.  No fracture or dislocation. The alignment and joint spaces are maintained. Atherosclerosis. IMPRESSION: Soft tissue defect at the base of the first toe. No radiopaque foreign body. No radiographic findings of osteomyelitis. Electronically Signed   By: Jeb Levering M.D.   On: 02/18/2016 23:07    Microbiology: No results  found for this or any previous visit (from the past 240 hour(s)).   Labs: Basic Metabolic Panel:  Recent Labs Lab 02/18/16 1415 02/19/16 0427  NA 135 135  K 4.1 3.3*  CL 100* 106  CO2 23 24  GLUCOSE 491* 276*  BUN 6 6  CREATININE 0.81 0.64  CALCIUM 9.2 8.4*   Liver Function Tests:  Recent Labs Lab 02/18/16 1415 02/19/16 0427  AST 16 11*  ALT 13* 10*  ALKPHOS 104 90  BILITOT 0.5 0.3  PROT 7.2 6.5  ALBUMIN 3.1* 2.7*   No results for input(s): LIPASE, AMYLASE in the last 168 hours. No results for input(s): AMMONIA in the last 168 hours. CBC:  Recent Labs Lab 02/18/16 1415 02/19/16 0427  WBC 4.9 4.3  NEUTROABS 3.2 2.5  HGB 11.5* 10.8*  HCT 36.2* 34.8*  MCV 75.3* 75.3*  PLT 288 263   Cardiac Enzymes: No results for input(s): CKTOTAL, CKMB, CKMBINDEX, TROPONINI in the last 168 hours. BNP: BNP (last 3 results) No results for input(s): BNP in the last 8760 hours.  ProBNP (last 3 results) No results for input(s): PROBNP in the last 8760 hours.  CBG:  Recent Labs Lab 02/19/16 1202 02/19/16 1639 02/19/16 2117 02/20/16 0630 02/20/16 1232  GLUCAP 193* 184* 225* 227* 216*       Signed:  Lavonte Palos K  [facility] 02/20/2016, 2:50 PM

## 2016-02-22 MED FILL — CEPHALEXIN 500 MG CAPSULE: 500 | 5 days supply | Qty: 22 | Fill #0

## 2016-02-24 ENCOUNTER — Encounter: Payer: Self-pay | Admitting: Family Medicine

## 2016-02-24 ENCOUNTER — Ambulatory Visit: Payer: Self-pay | Attending: Family Medicine | Admitting: Family Medicine

## 2016-02-24 VITALS — BP 151/95 | HR 69 | Temp 98.5°F | Resp 15 | Ht 67.0 in | Wt 189.0 lb

## 2016-02-24 DIAGNOSIS — E1169 Type 2 diabetes mellitus with other specified complication: Secondary | ICD-10-CM

## 2016-02-24 DIAGNOSIS — L089 Local infection of the skin and subcutaneous tissue, unspecified: Secondary | ICD-10-CM

## 2016-02-24 DIAGNOSIS — E1142 Type 2 diabetes mellitus with diabetic polyneuropathy: Secondary | ICD-10-CM

## 2016-02-24 DIAGNOSIS — Z794 Long term (current) use of insulin: Secondary | ICD-10-CM

## 2016-02-24 DIAGNOSIS — E11628 Type 2 diabetes mellitus with other skin complications: Secondary | ICD-10-CM

## 2016-02-24 DIAGNOSIS — E1165 Type 2 diabetes mellitus with hyperglycemia: Secondary | ICD-10-CM

## 2016-02-24 DIAGNOSIS — I10 Essential (primary) hypertension: Secondary | ICD-10-CM

## 2016-02-24 DIAGNOSIS — K5909 Other constipation: Secondary | ICD-10-CM

## 2016-02-24 LAB — CULTURE, BLOOD (ROUTINE X 2)
CULTURE: NO GROWTH
Culture: NO GROWTH

## 2016-02-24 LAB — GLUCOSE, POCT (MANUAL RESULT ENTRY): POC GLUCOSE: 145 mg/dL — AB (ref 70–99)

## 2016-02-24 LAB — POCT GLYCOSYLATED HEMOGLOBIN (HGB A1C): HEMOGLOBIN A1C: 13.7

## 2016-02-24 MED ORDER — LACTULOSE 10 GM/15ML PO SOLN: 10.0000 g | Freq: Two times a day (BID) | ORAL | Status: DC | PRN

## 2016-02-24 NOTE — Progress Notes (Signed)
Subjective:  Patient ID: Jeffrey [name], male    DOB: May 21, 1966  Age: 50 y.o. MRN: [id number]  CC: Follow-up   HPI Jeffrey [name] is a 50 year old man with a history of type 2 diabetes mellitus (A1c 13.7), diabetic neuropathy, hypertension, noncompliance who comes into the clinic for follow-up from hospitalization at [facility] from 02/18/16 through 02/20/16 for cellulitis of right foot.  He had presented with bleeding from a chronic wound on his right great toe and was found to have hyperglycemia with sugars in the 400. Workup included lower extremity Doppler which were negative for DVT, ABI revealed good vascular flow. His Lantus regimen was increased, he was placed on Keflex after being seen by wound care with recommendations for wound dressings with Bactroban.  He is yet to pick up the Keflex which was prescribed from the pharmacy He does have occasional left-sided abdominal pain which is absent at this time. Admits to a history of constipation occasionally but denies nausea, vomiting or blood in stools. His blood pressure is also elevated and he admits to running out of his lisinopril.  Outpatient Prescriptions Prior to Visit  Medication Sig Dispense Refill  . cephALEXin (KEFLEX) 500 MG capsule Take 1 capsule (500 mg total) by mouth 4 (four) times daily. (Patient not taking: Reported on 02/24/2016) 22 capsule 0  . docusate sodium (COLACE) 100 MG capsule Take 1 capsule (100 mg total) by mouth 2 (two) times daily as needed for mild constipation. 30 capsule 0  . gabapentin (NEURONTIN) 400 MG capsule Take 1 capsule (400 mg total) by mouth 3 (three) times daily. 30 capsule 3  . HYDROcodone-acetaminophen (NORCO/VICODIN) 5-325 MG tablet Take 1-2 tablets by mouth every 6 (six) hours as needed for moderate pain or severe pain. 30 tablet 0  . ibuprofen (ADVIL,MOTRIN) 600 MG tablet Take 1 tablet (600 mg total) by mouth every 6 (six) hours as needed. (Patient taking differently: Take 600  mg by mouth daily as needed (pain). ) 30 tablet 0  . insulin aspart (NOVOLOG) 100 UNIT/ML injection Inject 0-15 Units into the skin 3 (three) times daily with meals. Sliding scale  CBG 70 - 120: 0 units: CBG 121 - 150: 2 units; CBG 151 - 200: 3 units; CBG 201 - 250: 5 units; CBG 251 - 300: 8 units;CBG 301 - 350: 11 units; CBG 351 - 400: 15 units; CBG > 400 : 15 units and notify your  MD 10 mL 3  . Insulin Glargine (LANTUS SOLOSTAR) 100 UNIT/ML Solostar Pen Inject 44 Units into the skin daily at 10 pm. 5 pen 3  . Insulin Pen Needle 31G X 5 MM MISC Check blood sugar TID & QHS 100 each 2  . Insulin Syringe-Needle U-100 (INSULIN SYRINGE 1CC/31GX5/16") 31G X 5/16" 1 ML MISC Check blood sugar TID & QHS 100 each 2  . methocarbamol (ROBAXIN) 500 MG tablet Take 1 tablet (500 mg total) by mouth 2 (two) times daily. 20 tablet 0  . mupirocin cream (BACTROBAN) 2 % Apply topically daily. Apply Bactroban to right great toe once a day and cover with foam dressing.  (Change foam dressing every 5 days or sooner if needed if dirty.) 15 g 0   No facility-administered medications prior to visit.    ROS Review of Systems  Constitutional: Negative for activity change and appetite change.  HENT: Negative for sinus pressure and sore throat.   Eyes: Negative for visual disturbance.  Respiratory: Negative for cough, chest tightness and shortness of  breath.   Cardiovascular: Negative for chest pain and leg swelling.  Gastrointestinal: Negative for abdominal pain, diarrhea, constipation and abdominal distention.  Endocrine: Negative.   Genitourinary: Negative for dysuria.  Musculoskeletal: Negative for myalgias and joint swelling.       Right foot pain  Skin: Negative for rash.  Allergic/Immunologic: Negative.   Neurological: Positive for numbness. Negative for weakness and light-headedness.  Psychiatric/Behavioral: Negative for suicidal ideas and dysphoric mood.    Objective:  BP 151/95 mmHg  Pulse 69  Temp(Src)  98.5 F (36.9 C)  Resp 15  Ht 5\' 7"  (1.702 m)  Wt 189 lb (85.73 kg)  BMI 29.59 kg/m2  SpO2 96%  BP/Weight 02/24/2016 [id number]  Systolic BP 123XX123 0000000 -  Diastolic BP 95 74 -  Wt. (Lbs) 189 - 184  BMI 29.59 - 27.98      Physical Exam  Constitutional: He is oriented to person, place, and time. He appears well-developed and well-nourished.  Cardiovascular: Normal rate, normal heart sounds and intact distal pulses.   No murmur heard. Pulmonary/Chest: Effort normal and breath sounds normal. He has no wheezes. He has no rales. He exhibits no tenderness.  Abdominal: Soft. Bowel sounds are normal. He exhibits no distension and no mass. There is no tenderness.  Musculoskeletal:  Right big toe ulcer with bloody discharge and associated edema of toe  Neurological: He is alert and oriented to person, place, and time.  Sensory loss in both feet    CMP Latest Ref Rng 02/19/2016 02/18/2016 09/29/2015  Glucose 65 - 99 mg/dL 276(H) 491(H) 154(H)  BUN 6 - 20 mg/dL 6 6 5(L)  Creatinine 0.61 - 1.24 mg/dL 0.64 0.81 0.75  Sodium 135 - 145 mmol/L 135 135 135  Potassium 3.5 - 5.1 mmol/L 3.3(L) 4.1 3.5  Chloride 101 - 111 mmol/L 106 100(L) 101  CO2 22 - 32 mmol/L 24 23 27   Calcium 8.9 - 10.3 mg/dL 8.4(L) 9.2 8.8(L)  Total Protein 6.5 - 8.1 g/dL 6.5 7.2 -  Total Bilirubin 0.3 - 1.2 mg/dL 0.3 0.5 -  Alkaline Phos 38 - 126 U/L 90 104 -  AST 15 - 41 U/L 11(L) 16 -  ALT 17 - 63 U/L 10(L) 13(L) -     Lab Results  Component Value Date   HGBA1C 13.7 02/24/2016    Assessment & Plan:   1. Type 2 diabetes mellitus with hyperglycemia, with long-term current use of insulin (HCC) Uncontrolled with A1c of 13.7 Based on his reports of fasting sugars of 128 I will hold off on making changes to his regimen. Will review blood sugar log at his next visit. - Glucose (CBG) - HgB A1c  2. Diabetic polyneuropathy associated with type 2 diabetes mellitus ([facility]) Uncontrolled on gabapentin. Educated that  strict glycemic control should bring about improvement in symptoms  3. Diabetic foot infection ([place]) Patient is yet to pick up his antibiotic which was prescribed to him 5 days ago. I have explained to him that he is at high risk of amputation due to noncompliance and hyperglycemia. Dressing change performed in clinic Continue home dressing with Bactroban ointment.  4. Essential hypertension Uncontrolled due to running out of her antihypertensives which I have refilled.  5. Other constipation Abdominal pain could be secondary to constipation Dietary modifications advised. - lactulose (CHRONULAC) 10 GM/15ML solution; Take 15 mLs (10 g total) by mouth 2 (two) times daily as needed for mild constipation.  Dispense: 946 mL; Refill: 1   Meds ordered this encounter  Medications  . lactulose (CHRONULAC) 10 GM/15ML solution    Sig: Take 15 mLs (10 g total) by mouth 2 (two) times daily as needed for mild constipation.    Dispense:  946 mL    Refill:  1    Follow-up: Return in about 3 weeks (around 03/16/2016) for Follow-up on diabetes mellitus and hypertension.   Arnoldo Morale MD

## 2016-02-24 NOTE — Patient Instructions (Signed)

## 2016-02-25 MED FILL — LACTULOSE 10 GM/15 ML SOLN: 10 | 32 days supply | Qty: 946 | Fill #0

## 2016-02-25 MED FILL — LISINOPRIL 5 MG TABLET: 5 | 30 days supply | Qty: 30 | Fill #2

## 2016-02-25 MED FILL — METHOCARBAMOL 500 MG TABLET: 500 | 7 days supply | Qty: 30 | Fill #2

## 2016-02-25 MED FILL — GABAPENTIN 400 MG CAPSULE: 400 | 10 days supply | Qty: 30 | Fill #2

## 2016-03-12 DIAGNOSIS — E11628 Type 2 diabetes mellitus with other skin complications: Secondary | ICD-10-CM

## 2016-03-12 DIAGNOSIS — L089 Local infection of the skin and subcutaneous tissue, unspecified: Secondary | ICD-10-CM

## 2016-03-12 HISTORY — DX: Local infection of the skin and subcutaneous tissue, unspecified: L08.9

## 2016-03-12 HISTORY — DX: Type 2 diabetes mellitus with other skin complications: E11.628

## 2016-03-30 ENCOUNTER — Emergency Department (HOSPITAL_COMMUNITY): Payer: Self-pay

## 2016-03-30 ENCOUNTER — Inpatient Hospital Stay (HOSPITAL_COMMUNITY)
Admission: EM | Admit: 2016-03-30 | Discharge: 2016-04-03 | DRG: 617 | Disposition: A | Discharge status: Home / Self Care | Payer: Self-pay | Attending: Internal Medicine | Admitting: Internal Medicine

## 2016-03-30 ENCOUNTER — Encounter (HOSPITAL_COMMUNITY): Payer: Self-pay | Admitting: Emergency Medicine

## 2016-03-30 ENCOUNTER — Encounter: Payer: Self-pay | Admitting: Family Medicine

## 2016-03-30 ENCOUNTER — Ambulatory Visit: Payer: Self-pay | Attending: Family Medicine | Admitting: Family Medicine

## 2016-03-30 VITALS — BP 142/92 | HR 98 | Temp 98.3°F | Resp 16 | Ht 67.0 in | Wt 188.4 lb

## 2016-03-30 DIAGNOSIS — Z9119 Patient's noncompliance with other medical treatment and regimen: Secondary | ICD-10-CM | POA: Insufficient documentation

## 2016-03-30 DIAGNOSIS — E1140 Type 2 diabetes mellitus with diabetic neuropathy, unspecified: Secondary | ICD-10-CM | POA: Diagnosis present

## 2016-03-30 DIAGNOSIS — Z9114 Patient's other noncompliance with medication regimen: Secondary | ICD-10-CM | POA: Insufficient documentation

## 2016-03-30 DIAGNOSIS — E876 Hypokalemia: Secondary | ICD-10-CM | POA: Diagnosis present

## 2016-03-30 DIAGNOSIS — E11628 Type 2 diabetes mellitus with other skin complications: Secondary | ICD-10-CM | POA: Insufficient documentation

## 2016-03-30 DIAGNOSIS — Z79899 Other long term (current) drug therapy: Secondary | ICD-10-CM | POA: Insufficient documentation

## 2016-03-30 DIAGNOSIS — E1165 Type 2 diabetes mellitus with hyperglycemia: Secondary | ICD-10-CM | POA: Insufficient documentation

## 2016-03-30 DIAGNOSIS — D509 Iron deficiency anemia, unspecified: Secondary | ICD-10-CM | POA: Insufficient documentation

## 2016-03-30 DIAGNOSIS — I10 Essential (primary) hypertension: Secondary | ICD-10-CM | POA: Diagnosis present

## 2016-03-30 DIAGNOSIS — Z91148 Patient's other noncompliance with medication regimen for other reason: Secondary | ICD-10-CM | POA: Insufficient documentation

## 2016-03-30 DIAGNOSIS — L0390 Cellulitis, unspecified: Secondary | ICD-10-CM | POA: Diagnosis present

## 2016-03-30 DIAGNOSIS — L97519 Non-pressure chronic ulcer of other part of right foot with unspecified severity: Secondary | ICD-10-CM | POA: Insufficient documentation

## 2016-03-30 DIAGNOSIS — K5900 Constipation, unspecified: Secondary | ICD-10-CM | POA: Insufficient documentation

## 2016-03-30 DIAGNOSIS — L03115 Cellulitis of right lower limb: Secondary | ICD-10-CM | POA: Diagnosis present

## 2016-03-30 DIAGNOSIS — Z833 Family history of diabetes mellitus: Secondary | ICD-10-CM

## 2016-03-30 DIAGNOSIS — D62 Acute posthemorrhagic anemia: Secondary | ICD-10-CM | POA: Diagnosis not present

## 2016-03-30 DIAGNOSIS — E11621 Type 2 diabetes mellitus with foot ulcer: Secondary | ICD-10-CM | POA: Diagnosis present

## 2016-03-30 DIAGNOSIS — M869 Osteomyelitis, unspecified: Secondary | ICD-10-CM | POA: Diagnosis present

## 2016-03-30 DIAGNOSIS — G629 Polyneuropathy, unspecified: Secondary | ICD-10-CM | POA: Insufficient documentation

## 2016-03-30 DIAGNOSIS — E1142 Type 2 diabetes mellitus with diabetic polyneuropathy: Secondary | ICD-10-CM

## 2016-03-30 DIAGNOSIS — E1169 Type 2 diabetes mellitus with other specified complication: Secondary | ICD-10-CM | POA: Diagnosis present

## 2016-03-30 DIAGNOSIS — L089 Local infection of the skin and subcutaneous tissue, unspecified: Secondary | ICD-10-CM | POA: Diagnosis present

## 2016-03-30 DIAGNOSIS — Z794 Long term (current) use of insulin: Secondary | ICD-10-CM

## 2016-03-30 DIAGNOSIS — E11622 Type 2 diabetes mellitus with other skin ulcer: Secondary | ICD-10-CM | POA: Diagnosis present

## 2016-03-30 HISTORY — DX: Type 2 diabetes mellitus with other skin complications: E11.628

## 2016-03-30 HISTORY — DX: Local infection of the skin and subcutaneous tissue, unspecified: L08.9

## 2016-03-30 LAB — GLUCOSE, CAPILLARY
GLUCOSE-CAPILLARY: 129 mg/dL — AB (ref 65–99)
GLUCOSE-CAPILLARY: 249 mg/dL — AB (ref 65–99)

## 2016-03-30 LAB — CBC
HCT: 38.8 % — ABNORMAL LOW (ref 39.0–52.0)
HEMATOCRIT: 36.6 % — AB (ref 39.0–52.0)
HEMOGLOBIN: 11.3 g/dL — AB (ref 13.0–17.0)
Hemoglobin: 12.4 g/dL — ABNORMAL LOW (ref 13.0–17.0)
MCH: 24.2 pg — ABNORMAL LOW (ref 26.0–34.0)
MCH: 23.4 pg — AB (ref 26.0–34.0)
MCHC: 32 g/dL (ref 30.0–36.0)
MCHC: 30.9 g/dL (ref 30.0–36.0)
MCV: 75.6 fL — AB (ref 78.0–100.0)
MCV: 75.8 fL — AB (ref 78.0–100.0)
PLATELETS: 269 10*3/uL (ref 150–400)
Platelets: 286 10*3/uL (ref 150–400)
RBC: 5.13 MIL/uL (ref 4.22–5.81)
RBC: 4.83 MIL/uL (ref 4.22–5.81)
RDW: 14.8 % (ref 11.5–15.5)
RDW: 14.8 % (ref 11.5–15.5)
WBC: 7.4 10*3/uL (ref 4.0–10.5)
WBC: 6.5 10*3/uL (ref 4.0–10.5)

## 2016-03-30 LAB — POCT URINALYSIS DIPSTICK
Bilirubin, UA: NEGATIVE
Glucose, UA: 500
Ketones, UA: NEGATIVE
LEUKOCYTES UA: NEGATIVE
NITRITE UA: NEGATIVE
PH UA: 6
PROTEIN UA: 30
Spec Grav, UA: <=1.005
UROBILINOGEN UA: 0.2

## 2016-03-30 LAB — BASIC METABOLIC PANEL
Anion gap: 9 (ref 5–15)
BUN: 9 mg/dL (ref 6–20)
CALCIUM: 9.6 mg/dL (ref 8.9–10.3)
CO2: 23 mmol/L (ref 22–32)
CREATININE: 0.79 mg/dL (ref 0.61–1.24)
Chloride: 102 mmol/L (ref 101–111)
Glucose, Bld: 300 mg/dL — ABNORMAL HIGH (ref 65–99)
Potassium: 3.8 mmol/L (ref 3.5–5.1)
SODIUM: 134 mmol/L — AB (ref 135–145)

## 2016-03-30 LAB — CBG MONITORING, ED
GLUCOSE-CAPILLARY: 332 mg/dL — AB (ref 65–99)
GLUCOSE-CAPILLARY: 147 mg/dL — AB (ref 65–99)

## 2016-03-30 LAB — CREATININE, SERUM
Creatinine, Ser: 0.84 mg/dL (ref 0.61–1.24)
GFR calc Af Amer: >60 mL/min (ref >60)
GFR calc non Af Amer: >60 mL/min (ref >60)

## 2016-03-30 LAB — I-STAT CG4 LACTIC ACID, ED
LACTIC ACID, VENOUS: 1.93 mmol/L (ref 0.5–2.0)
LACTIC ACID, VENOUS: 2.03 mmol/L — AB (ref 0.5–2.0)

## 2016-03-30 LAB — GLUCOSE, POCT (MANUAL RESULT ENTRY)
POC GLUCOSE: 429 mg/dL — AB (ref 70–99)
POC GLUCOSE: 412 mg/dL — AB (ref 70–99)

## 2016-03-30 MED ORDER — PIPERACILLIN-TAZOBACTAM 3.375 G IVPB 30 MIN
3.3750 g | Freq: Once | INTRAVENOUS | Status: AC
  Administered 2016-03-30: 3.375 g via INTRAVENOUS
  Filled 2016-03-30: qty 50

## 2016-03-30 MED ORDER — GABAPENTIN 400 MG PO CAPS: 400.0000 mg | ORAL_CAPSULE | Freq: Three times a day (TID) | ORAL | Status: DC

## 2016-03-30 MED ORDER — INSULIN GLARGINE 100 UNIT/ML ~~LOC~~ SOLN
50.0000 [IU] | Freq: Every day | SUBCUTANEOUS | Status: DC
  Administered 2016-03-30 – 2016-04-01 (×3): 50 [IU] via SUBCUTANEOUS
  Filled 2016-03-30 (×5): qty 0.5

## 2016-03-30 MED ORDER — LISINOPRIL 10 MG PO TABS: 10.0000 mg | ORAL_TABLET | Freq: Every day | ORAL | Status: DC

## 2016-03-30 MED ORDER — SENNOSIDES-DOCUSATE SODIUM 8.6-50 MG PO TABS
1.0000 | ORAL_TABLET | Freq: Every evening | ORAL | Status: DC | PRN
  Administered 2016-04-02: 1 via ORAL
  Filled 2016-03-30: qty 1

## 2016-03-30 MED ORDER — GABAPENTIN 400 MG PO CAPS
400.0000 mg | ORAL_CAPSULE | Freq: Three times a day (TID) | ORAL | Status: DC
  Administered 2016-03-30 – 2016-04-03 (×10): 400 mg via ORAL
  Filled 2016-03-30 (×10): qty 1

## 2016-03-30 MED ORDER — INSULIN GLARGINE 100 UNIT/ML SOLOSTAR PEN: 50.0000 [IU] | PEN_INJECTOR | Freq: Every day | SUBCUTANEOUS | Status: DC

## 2016-03-30 MED ORDER — ACETAMINOPHEN 650 MG RE SUPP: 650.0000 mg | Freq: Four times a day (QID) | RECTAL | Status: DC | PRN

## 2016-03-30 MED ORDER — VANCOMYCIN HCL IN DEXTROSE 1-5 GM/200ML-% IV SOLN
1000.0000 mg | Freq: Three times a day (TID) | INTRAVENOUS | Status: DC
  Administered 2016-03-31 – 2016-04-02 (×7): 1000 mg via INTRAVENOUS
  Filled 2016-03-30 (×10): qty 200

## 2016-03-30 MED ORDER — ONDANSETRON HCL 4 MG/2ML IJ SOLN: 4.0000 mg | Freq: Four times a day (QID) | INTRAMUSCULAR | Status: DC | PRN

## 2016-03-30 MED ORDER — INSULIN ASPART 100 UNIT/ML ~~LOC~~ SOLN
20.0000 [IU] | Freq: Once | SUBCUTANEOUS | Status: DC
  Administered 2016-03-30: 20 [IU] via SUBCUTANEOUS

## 2016-03-30 MED ORDER — SODIUM CHLORIDE 0.9 % IV SOLN
1500.0000 mg | Freq: Once | INTRAVENOUS | Status: DC
  Filled 2016-03-30: qty 1500

## 2016-03-30 MED ORDER — VANCOMYCIN HCL 10 G IV SOLR
1500.0000 mg | Freq: Once | INTRAVENOUS | Status: AC
  Administered 2016-03-30: 1500 mg via INTRAVENOUS
  Filled 2016-03-30: qty 1500

## 2016-03-30 MED ORDER — ENOXAPARIN SODIUM 40 MG/0.4ML ~~LOC~~ SOLN
40.0000 mg | SUBCUTANEOUS | Status: DC
  Administered 2016-03-30 – 2016-04-02 (×4): 40 mg via SUBCUTANEOUS
  Filled 2016-03-30 (×4): qty 0.4

## 2016-03-30 MED ORDER — HYDROCODONE-ACETAMINOPHEN 5-325 MG PO TABS
1.0000 | ORAL_TABLET | Freq: Four times a day (QID) | ORAL | Status: DC | PRN
  Administered 2016-04-01: 2 via ORAL
  Filled 2016-03-30: qty 2

## 2016-03-30 MED ORDER — PIPERACILLIN-TAZOBACTAM 3.375 G IVPB
3.3750 g | Freq: Three times a day (TID) | INTRAVENOUS | Status: DC
  Administered 2016-03-30 – 2016-04-03 (×11): 3.375 g via INTRAVENOUS
  Filled 2016-03-30 (×15): qty 50

## 2016-03-30 MED ORDER — SODIUM CHLORIDE 0.9 % IV SOLN
INTRAVENOUS | Status: DC
  Administered 2016-03-30 – 2016-04-02 (×4): via INTRAVENOUS

## 2016-03-30 MED ORDER — LISINOPRIL 10 MG PO TABS
10.0000 mg | ORAL_TABLET | Freq: Every day | ORAL | Status: DC
  Administered 2016-03-30 – 2016-04-03 (×5): 10 mg via ORAL
  Filled 2016-03-30 (×5): qty 1

## 2016-03-30 MED ORDER — INSULIN ASPART 100 UNIT/ML ~~LOC~~ SOLN: 0.0000 [IU] | Freq: Three times a day (TID) | SUBCUTANEOUS | Status: DC

## 2016-03-30 MED ORDER — ACETAMINOPHEN 325 MG PO TABS
650.0000 mg | ORAL_TABLET | Freq: Four times a day (QID) | ORAL | Status: DC | PRN
Start: 2016-03-30 — End: 2016-04-03
  Filled 2016-03-30: qty 2

## 2016-03-30 MED ORDER — INSULIN ASPART 100 UNIT/ML ~~LOC~~ SOLN
0.0000 [IU] | Freq: Three times a day (TID) | SUBCUTANEOUS | Status: DC
  Administered 2016-03-30: 3 [IU] via SUBCUTANEOUS
  Administered 2016-03-31: 4 [IU] via SUBCUTANEOUS
  Administered 2016-03-31: 7 [IU] via SUBCUTANEOUS
  Administered 2016-03-31: 15 [IU] via SUBCUTANEOUS
  Administered 2016-04-01 – 2016-04-02 (×2): 3 [IU] via SUBCUTANEOUS
  Administered 2016-04-02 (×2): 4 [IU] via SUBCUTANEOUS
  Administered 2016-04-03: 7 [IU] via SUBCUTANEOUS
  Administered 2016-04-03: 4 [IU] via SUBCUTANEOUS

## 2016-03-30 MED ORDER — ONDANSETRON HCL 4 MG PO TABS: 4.0000 mg | ORAL_TABLET | Freq: Four times a day (QID) | ORAL | Status: DC | PRN

## 2016-03-30 MED ORDER — LACTULOSE 10 GM/15ML PO SOLN: 10.0000 g | Freq: Two times a day (BID) | ORAL | Status: DC | PRN

## 2016-03-30 MED FILL — !LANTUS SOLOSTAR 100UNITS/M: 100 | 30 days supply | Qty: 15 | Fill #0

## 2016-03-30 MED FILL — GABAPENTIN 400 MG CAPSULE: 400 | 30 days supply | Qty: 90 | Fill #0

## 2016-03-30 MED FILL — ?LISINOPRIL 10 MG TABLET: 10 | 30 days supply | Qty: 30 | Fill #0

## 2016-03-30 NOTE — Progress Notes (Signed)
Subjective:    Patient ID: Jeffrey [name], male    DOB: [birth year], 50 y.o.   MRN: [id number]  HPI 50 year old male with a history of type 2 diabetes mellitus (A1c 13.7), diabetic neuropathy, hypertension, noncompliance, cellulitis of right foot who comes into the clinic for a follow-up visit. He has been noncompliant with his insulin and has been eating a lot of candy  And did not take his NovoLog with breakfast this morning but took his Lantus 44 units last night and his CBG today in the clinic is 429 ( 20 units of NovoLog administered in the clinic).  Most of the history is provided by  His male companion who informs me that the patient does a lot of late-night eating.  He has a right big toe ulcer which has worsened  Since his last office visit. He was hospitalized for right foot cellulitis   Last month,  At which time lower extremity Doppler was negative for DVT and he was discharged on Keflex and Bactroban ointment. Dressing changes have been performed  At home by the  Male companion he has with him. Denies fever. He does have significant peripheral neuropathy both feet.    Remains on lactulose for constipation which helps a lot and he is requesting refills of all his medications.  Past Medical History  Diagnosis Date  . Diabetes mellitus   . HTN (hypertension)     Past Surgical History  Procedure Laterality Date  . Back surgery      for abscess    No Known Allergies  Current Outpatient Prescriptions on File Prior to Visit  Medication Sig Dispense Refill  . docusate sodium (COLACE) 100 MG capsule Take 1 capsule (100 mg total) by mouth 2 (two) times daily as needed for mild constipation. 30 capsule 0  . gabapentin (NEURONTIN) 400 MG capsule Take 1 capsule (400 mg total) by mouth 3 (three) times daily. 30 capsule 3  . HYDROcodone-acetaminophen (NORCO/VICODIN) 5-325 MG tablet Take 1-2 tablets by mouth every 6 (six) hours as needed for moderate pain or severe pain. 30  tablet 0  . ibuprofen (ADVIL,MOTRIN) 600 MG tablet Take 1 tablet (600 mg total) by mouth every 6 (six) hours as needed. (Patient taking differently: Take 600 mg by mouth daily as needed (pain). ) 30 tablet 0  . insulin aspart (NOVOLOG) 100 UNIT/ML injection Inject 0-15 Units into the skin 3 (three) times daily with meals. Sliding scale  CBG 70 - 120: 0 units: CBG 121 - 150: 2 units; CBG 151 - 200: 3 units; CBG 201 - 250: 5 units; CBG 251 - 300: 8 units;CBG 301 - 350: 11 units; CBG 351 - 400: 15 units; CBG > 400 : 15 units and notify your  MD 10 mL 3  . Insulin Glargine (LANTUS SOLOSTAR) 100 UNIT/ML Solostar Pen Inject 44 Units into the skin daily at 10 pm. 5 pen 3  . Insulin Pen Needle 31G X 5 MM MISC Check blood sugar TID & QHS 100 each 2  . Insulin Syringe-Needle U-100 (INSULIN SYRINGE 1CC/31GX5/16") 31G X 5/16" 1 ML MISC Check blood sugar TID & QHS 100 each 2  . lactulose (CHRONULAC) 10 GM/15ML solution Take 15 mLs (10 g total) by mouth 2 (two) times daily as needed for mild constipation. 946 mL 1  . methocarbamol (ROBAXIN) 500 MG tablet Take 1 tablet (500 mg total) by mouth 2 (two) times daily. 20 tablet 0  . mupirocin cream (BACTROBAN) 2 % Apply topically daily.  Apply Bactroban to right great toe once a day and cover with foam dressing.  (Change foam dressing every 5 days or sooner if needed if dirty.) 15 g 0   No current facility-administered medications on file prior to visit.      Review of Systems Review of Systems  Constitutional: Negative for activity change and appetite change.  HENT: Negative for sinus pressure and sore throat.   Eyes: Negative for visual disturbance.  Respiratory: Negative for cough, chest tightness and shortness of breath.   Cardiovascular: Negative for chest pain and leg swelling.  Gastrointestinal: Negative for abdominal pain, diarrhea, constipation and abdominal distention.  Endocrine: Negative.   Genitourinary: Negative for dysuria.  Musculoskeletal:  Negative for myalgias and joint swelling.       Right foot ulcer and pain  Skin: Negative for rash.  Allergic/Immunologic: Negative.   Neurological: Positive for numbness. Negative for weakness and light-headedness.  Psychiatric/Behavioral: Negative for suicidal ideas and dysphoric mood.       Objective: Filed Vitals:   03/30/16 1001  BP: 142/92  Pulse: 98  Temp: 98.3 F (36.8 C)  TempSrc: Oral  Resp: 16  Height: 5\' 7"  (1.702 m)  Weight: 188 lb 6.4 oz (85.458 kg)  SpO2: 98%      Physical Exam  Constitutional: He is oriented to person, place, and time. He appears well-developed and well-nourished.  Cardiovascular: Normal rate, normal heart sounds and intact distal pulses.   No murmur heard. Pulmonary/Chest: Effort normal and breath sounds normal. He has no wheezes. He has no rales. He exhibits no tenderness.  Abdominal: Soft. Bowel sounds are normal. He exhibits no distension and no mass. There is no tenderness.  Musculoskeletal:  Right big toe ulcer with bloody discharge and significant associated edema of toe,  necrosis of surrounding skin, sensory loss  Neurological: He is alert and oriented to person, place, and time.  Sensory loss in both feet         Assessment & Plan:  1. Type 2 diabetes mellitus with hyperglycemia, with long-term current use of insulin (HCC) Uncontrolled with A1c of 13.7 due to noncompliance CBG is 429 , UA negative for ketones; NovoLog 20 units administered in the clinic and patient observed for 45 minutes and CBG repeated He has maintained dietary indiscretion and has been nonchalant about control of his diabetes I have discussed with him the implications of his noncompliance including complications of diabetes which include death.  2. Diabetic polyneuropathy associated with type 2 diabetes mellitus (HCC) Uncontrolled on gabapentin. Educated that strict glycemic control should bring about improvement in symptoms  3. Diabetic foot infection  ([place]) This has worsened since his last visit and poor glycemic control is largely contributory with evidence of gangrene. Referred to the ED as osteomyelitis would need to be excluded and he would need to be evaluated by orthopedics as I am concerned he might need an amputation Dressing change performed in clinic   4. Essential hypertension Mildly elevated and above target of less than 140/90 Continue lisinopril, low-sodium, DASH diet  5. Other constipation Controlled on lactulose  6. Hypokalemia Last potassium was 3.3. He needs a BMET but given he will be going to the ED I will defer this to the ED.   This note has been created with Surveyor, quantity. Any transcriptional errors are unintentional.

## 2016-03-30 NOTE — ED Notes (Signed)
  CBG 332

## 2016-03-30 NOTE — ED Notes (Signed)
Attempted report 

## 2016-03-30 NOTE — ED Provider Notes (Signed)
CSN: [id number]     Arrival date & time 03/30/16  1057 History   First MD Initiated Contact with Patient 03/30/16 1339     Chief Complaint  Patient presents with  . Wound Check  . Hyperglycemia  Medical records obtained from the patient as well as the doctor's office note HPI Patient presented to the emergency room for admission to the hospital. Patient had been admitted to the hospital previously for cellulitis of his right big toe. Patient has noticed increasing swelling over the last several days.  Patient has not been taking his medications regularly. He denies fevers or chills. No vomiting or diarrhea.  He went to his doctor's office today for a follow-up visit and was sent to the hospital for admission.  According to his doctor's office visit "50 year old male with a history of type 2 diabetes mellitus (A1c 13.7), diabetic neuropathy, hypertension, noncompliance, cellulitis of right foot who comes into the clinic for a follow-up visit. He has been noncompliant with his insulin and has been eating a lot of candy And did not take his NovoLog with breakfast this morning but took his Lantus 44 units last night and his CBG today in the clinic is 429 ( 20 units of NovoLog administered in the clinic). Most of the history is provided by His male companion who informs me that the patient does a lot of late-night eating.  He has a right big toe ulcer which has worsened Since his last office visit. He was hospitalized for right foot cellulitis Last month, At which time lower extremity Doppler was negative for DVT and he was discharged on Keflex and Bactroban ointment. Dressing changes have been performed At home by the Male companion he has with him. Denies fever."  Past Medical History  Diagnosis Date  . Diabetes mellitus   . HTN (hypertension)    Past Surgical History  Procedure Laterality Date  . Back surgery      for abscess   Family History  Problem Relation Age of Onset  .  Diabetes Mother   . Hypertension Mother   . Diabetes Sister    Social History  Substance Use Topics  . Smoking status: Never Smoker   . Smokeless tobacco: None  . Alcohol Use: No    Review of Systems  All other systems reviewed and are negative.     Allergies  Review of patient's allergies indicates no known allergies.  Home Medications   Prior to Admission medications   Medication Sig Start Date End Date Taking? Authorizing Provider  gabapentin (NEURONTIN) 400 MG capsule Take 1 capsule (400 mg total) by mouth 3 (three) times daily. 03/30/16  Yes Arnoldo Morale, MD  insulin aspart (NOVOLOG) 100 UNIT/ML injection Inject 0-15 Units into the skin 3 (three) times daily with meals. Sliding scale  CBG 70 - 120: 0 units: CBG 121 - 150: 2 units; CBG 151 - 200: 3 units; CBG 201 - 250: 5 units; CBG 251 - 300: 8 units;CBG 301 - 350: 11 units; CBG 351 - 400: 15 units; CBG > 400 : 15 units and notify your  MD 03/30/16  Yes Arnoldo Morale, MD  Insulin Glargine (LANTUS SOLOSTAR) 100 UNIT/ML Solostar Pen Inject 50 Units into the skin daily at 10 pm. 03/30/16  Yes Arnoldo Morale, MD  lactulose (CHRONULAC) 10 GM/15ML solution Take 15 mLs (10 g total) by mouth 2 (two) times daily as needed for mild constipation. 02/24/16  Yes Arnoldo Morale, MD  lisinopril (PRINIVIL,ZESTRIL) 10 MG tablet  Take 1 tablet (10 mg total) by mouth daily. 03/30/16  Yes Arnoldo Morale, MD  HYDROcodone-acetaminophen (NORCO/VICODIN) 5-325 MG tablet Take 1-2 tablets by mouth every 6 (six) hours as needed for moderate pain or severe pain. Patient not taking: Reported on 03/30/2016 02/20/16   Annita Brod, MD  Insulin Pen Needle 31G X 5 MM MISC Check blood sugar TID & QHS Patient not taking: Reported on 03/30/2016 03/04/15   Lorayne Marek, MD  Insulin Syringe-Needle U-100 (INSULIN SYRINGE 1CC/31GX5/16") 31G X 5/16" 1 ML MISC Check blood sugar TID & QHS 03/04/15   Lorayne Marek, MD  mupirocin cream (BACTROBAN) 2 % Apply topically daily. Apply  Bactroban to right great toe once a day and cover with foam dressing.  (Change foam dressing every 5 days or sooner if needed if dirty.) Patient not taking: Reported on 03/30/2016 02/20/16   Annita Brod, MD   BP 134/96 mmHg  Pulse 100  Temp(Src) 98 F (36.7 C) (Oral)  Resp 18  SpO2 100% Physical Exam  Constitutional: He appears well-developed and well-nourished. No distress.  HENT:  Head: Normocephalic and atraumatic.  Right Ear: External ear normal.  Left Ear: External ear normal.  Eyes: Conjunctivae are normal. Right eye exhibits no discharge. Left eye exhibits no discharge. No scleral icterus.  Neck: Neck supple. No tracheal deviation present.  Cardiovascular: Normal rate, regular rhythm and intact distal pulses.   Pulmonary/Chest: Effort normal and breath sounds normal. No stridor. No respiratory distress. He has no wheezes. He has no rales.  Abdominal: Soft. Bowel sounds are normal. He exhibits no distension. There is no tenderness. There is no rebound and no guarding.  Musculoskeletal: He exhibits edema and tenderness.  Macerated skin tissue on the right big toe, surrounding erythema, erythema involving the distal portion of the midfoot, no fluctuance, no purulent drainage  Neurological: He is alert. He has normal strength. No cranial nerve deficit (no facial droop, extraocular movements intact, no slurred speech) or sensory deficit. He exhibits normal muscle tone. He displays no seizure activity. Coordination normal.  Skin: Skin is warm and dry. No rash noted.  Psychiatric: He has a normal mood and affect.  Nursing note and vitals reviewed.   ED Course  Procedures (including critical care time) Labs Review Labs Reviewed  BASIC METABOLIC PANEL - Abnormal; Notable for the following:    Sodium 134 (*)    Glucose, Bld 300 (*)    All other components within normal limits  CBC - Abnormal; Notable for the following:    Hemoglobin 12.4 (*)    HCT 38.8 (*)    MCV 75.6 (*)     MCH 24.2 (*)    All other components within normal limits  CBG MONITORING, ED - Abnormal; Notable for the following:    Glucose-Capillary 332 (*)    All other components within normal limits  URINALYSIS, ROUTINE W REFLEX MICROSCOPIC (NOT AT [facility])  I-STAT CG4 LACTIC ACID, ED  I-STAT CG4 LACTIC ACID, ED    Imaging Review Dg Foot Complete Right  03/30/2016  CLINICAL DATA:  50 year old male with diabetic wound right big toe for 1 month. Pain spreading into the proximal foot. Subsequent encounter. EXAM: RIGHT FOOT COMPLETE - 3+ VIEW COMPARISON:  Right forefoot MRI 02/19/2016. Right foot radiographs 02/18/2016. FINDINGS: Dressing material about the right great toe. The right first phalanges appear intact with no cortical osteolysis identified. Questionable mild narrowing of the right first IP joint since March. The metatarsals and other phalanges also appear stable  and intact. Calcified peripheral vascular disease again noted. Worsening soft tissue swelling about the distal foot. No subcutaneous gas. Hindfoot appears stable and intact. IMPRESSION: Worsening soft tissue swelling in the distal right foot but no plain radiographic features of osteomyelitis at this time. No subcutaneous gas. Electronically Signed   By: Genevie Ann M.D.   On: 03/30/2016 11:48   I have personally reviewed and evaluated these images and lab results as part of my medical decision-making.    MDM   Final diagnoses:  Cellulitis of right foot  Uncontrolled type 2 diabetes mellitus with hyperglycemia, with long-term current use of insulin ([place])    Patient presents to be admitted to the hospital for worsening cellulitis associated with noncompliance of his diabetes regimen.  [name] laboratory tests are reassuring. His hyperglycemia is decreasing. Patient is nontoxic and does not appear to be septic. Plain film x-rays do not suggest osteomyelitis.  Patient has been started on IV Zosyn.  Plan on admission to the hospital for IV  antibiotics.   Dorie Rank, MD 03/30/16 [phone]

## 2016-03-30 NOTE — ED Notes (Signed)
Pt sent here for eval and admission for right foot wound that is infected; pt with hx of DM and hyperglycemia today; given 20U of insulin prior to arrival

## 2016-03-30 NOTE — ED Notes (Signed)
Pt and family made aware of bed assignment 

## 2016-03-30 NOTE — H&P (Signed)
History and Physical    Jeffrey [name] [id number] DOB: Aug 21, 1966 DOA: 03/30/2016  Referring MD/NP/PA: Dr Dorie Rank PCP: Arnoldo Morale, MD Patient coming from: sent to ED from PCP office  Chief Complaint: right great toe wound  HPI: Jeffrey [name] is a 50 y.o. male with medical history significant of uncontrolled DM2 (A1C 13.7), hypertension, diabetic neuropathy, noncompliance and recent admission 3/9 through 02/20/16 for right great toe infection and cellulitis. During that hospitalization pt treated with IV antibiotics and discharged on po Keflex to complete course. Pt states he completed medication, but both he and girlfriend state wound has become worse.   Pt was sent to ED today by PCP for concern for osteomyelitis. He is to be admitted by [facility] at this time for further evaluation and treatment. Pt is poor historian. Both he and girlfriend deny recent fever, cough, chest pain, sob, abdominal pain, n/v/d.  ED Course: Blood glucose 300 (received 20units insulin SQ at PCP just pta), lactic acid 2. Plain film of right foot shows worsening soft tissue swelling without evidence of osteomyelitis.   Review of Systems: As per HPI otherwise 10 point review of systems negative. No Headache, No changes with Vision or hearing, No problems swallowing food or Liquids, No Chest pain, Cough or Shortness of Breath, No Blood in stool or Urine, No dysuria, No new skin rashes or bruises, No new joints pains-aches,  No new weakness, tingling, numbness in any extremity, No recent weight gain or loss,  Past Medical History  Diagnosis Date  . Diabetes mellitus   . HTN (hypertension)     Past Surgical History  Procedure Laterality Date  . Back surgery      for abscess   Social hx.  reports that he has never smoked. He does not have any smokeless tobacco history on file. He reports that he does not drink alcohol or use illicit drugs. works for [employer]. But out of work for 1 month due  to current issues.   No Known Allergies  Family History  Problem Relation Age of Onset  . Diabetes Mother   . Hypertension Mother   . Diabetes Sister     Prior to Admission medications   Medication Sig Start Date End Date Taking? Authorizing Provider  gabapentin (NEURONTIN) 400 MG capsule Take 1 capsule (400 mg total) by mouth 3 (three) times daily. 03/30/16  Yes Arnoldo Morale, MD  insulin aspart (NOVOLOG) 100 UNIT/ML injection Inject 0-15 Units into the skin 3 (three) times daily with meals. Sliding scale  CBG 70 - 120: 0 units: CBG 121 - 150: 2 units; CBG 151 - 200: 3 units; CBG 201 - 250: 5 units; CBG 251 - 300: 8 units;CBG 301 - 350: 11 units; CBG 351 - 400: 15 units; CBG > 400 : 15 units and notify your  MD 03/30/16  Yes Arnoldo Morale, MD  Insulin Glargine (LANTUS SOLOSTAR) 100 UNIT/ML Solostar Pen Inject 50 Units into the skin daily at 10 pm. 03/30/16  Yes Arnoldo Morale, MD  lactulose (CHRONULAC) 10 GM/15ML solution Take 15 mLs (10 g total) by mouth 2 (two) times daily as needed for mild constipation. 02/24/16  Yes Arnoldo Morale, MD  lisinopril (PRINIVIL,ZESTRIL) 10 MG tablet Take 1 tablet (10 mg total) by mouth daily. 03/30/16  Yes Arnoldo Morale, MD  HYDROcodone-acetaminophen (NORCO/VICODIN) 5-325 MG tablet Take 1-2 tablets by mouth every 6 (six) hours as needed for moderate pain or severe pain. Patient not taking: Reported on 03/30/2016 02/20/16  Annita Brod, MD  Insulin Pen Needle 31G X 5 MM MISC Check blood sugar TID & QHS Patient not taking: Reported on 03/30/2016 03/04/15   Lorayne Marek, MD  Insulin Syringe-Needle U-100 (INSULIN SYRINGE 1CC/31GX5/16") 31G X 5/16" 1 ML MISC Check blood sugar TID & QHS 03/04/15   Lorayne Marek, MD  mupirocin cream (BACTROBAN) 2 % Apply topically daily. Apply Bactroban to right great toe once a day and cover with foam dressing.  (Change foam dressing every 5 days or sooner if needed if dirty.) Patient not taking: Reported on 03/30/2016 02/20/16   Annita Brod, MD    Physical Exam: Filed Vitals:   03/30/16 1107 03/30/16 1500  BP: 134/96 133/85  Pulse: 100 93  Temp: 98 F (36.7 C)   TempSrc: Oral   Resp: 18   SpO2: 100% 99%     Constitutional: NAD, calm, comfortable Eyes: PERRL, lids and conjunctivae normal ENMT: Mucous membranes are moist. Posterior pharynx clear of any exudate or lesions.Normal dentition.  Neck: normal, supple, no masses, no thyromegaly Respiratory: clear to auscultation bilaterally, no wheezing, no crackles. Normal respiratory effort. No accessory muscle use.  Cardiovascular: Regular rate and rhythm, no murmurs / rubs / gallops. No extremity edema. 2+ pedal pulses. No carotid bruits.  Abdomen: no tenderness, no masses palpated. No hepatosplenomegaly. Bowel sounds positive.  Musculoskeletal: no clubbing / cyanosis. No joint deformity upper and lower extremities. Good ROM, no contractures. Normal muscle tone.  Skin: Significant ulceration to lower and medial aspect of right great toe with 40mm size opening on dorsal aspect of toe. Mild surrounding erythema to dorsal aspect of foot Neurologic: CN 2-12 grossly intact. Sensation intact, DTR normal. Strength 5/5 in all 4.  Psychiatric: Normal judgment and insight. Alert and oriented x 3. Normal mood.    Labs on Admission: I have personally reviewed following labs and imaging studies  CBC:  Recent Labs Lab 03/30/16 1110  WBC 7.4  HGB 12.4*  HCT 38.8*  MCV 75.6*  PLT [id number]   Basic Metabolic Panel:  Recent Labs Lab 03/30/16 1110  NA 134*  K 3.8  CL 102  CO2 23  GLUCOSE 300*  BUN 9  CREATININE 0.79  CALCIUM 9.6   GFR: Estimated Creatinine Clearance: 114.2 mL/min (by C-G formula based on Cr of 0.79).  Recent Labs Lab 03/30/16 1108  GLUCAP 332*   Lipid Profile: No results for input(s): CHOL, HDL, LDLCALC, TRIG, CHOLHDL, LDLDIRECT in the last 72 hours. Thyroid Function Tests: No results for input(s): TSH, T4TOTAL, FREET4, T3FREE, THYROIDAB in  the last 72 hours. Anemia Panel: No results for input(s): VITAMINB12, FOLATE, FERRITIN, TIBC, IRON, RETICCTPCT in the last 72 hours. Urine analysis:    Component Value Date/Time   COLORURINE YELLOW 03/10/2014 2230   APPEARANCEUR CLEAR 03/10/2014 2230   LABSPEC 1.010 03/10/2014 2230   PHURINE 6.0 03/10/2014 2230   GLUCOSEU >1000* 03/10/2014 2230   HGBUR NEGATIVE 03/10/2014 2230   BILIRUBINUR neg 03/30/2016 1116   BILIRUBINUR NEGATIVE 03/10/2014 2230   KETONESUR NEGATIVE 03/10/2014 2230   PROTEINUR 30 03/30/2016 1116   PROTEINUR NEGATIVE 03/10/2014 2230   UROBILINOGEN 0.2 03/30/2016 1116   UROBILINOGEN 1.0 03/10/2014 2230   NITRITE neg 03/30/2016 1116   NITRITE NEGATIVE 03/10/2014 2230   LEUKOCYTESUR Negative 03/30/2016 1116    Radiological Exams on Admission: Dg Foot Complete Right  03/30/2016  CLINICAL DATA:  50 year old male with diabetic wound right big toe for 1 month. Pain spreading into the proximal foot. Subsequent encounter.  EXAM: RIGHT FOOT COMPLETE - 3+ VIEW COMPARISON:  Right forefoot MRI 02/19/2016. Right foot radiographs 02/18/2016. FINDINGS: Dressing material about the right great toe. The right first phalanges appear intact with no cortical osteolysis identified. Questionable mild narrowing of the right first IP joint since March. The metatarsals and other phalanges also appear stable and intact. Calcified peripheral vascular disease again noted. Worsening soft tissue swelling about the distal foot. No subcutaneous gas. Hindfoot appears stable and intact. IMPRESSION: Worsening soft tissue swelling in the distal right foot but no plain radiographic features of osteomyelitis at this time. No subcutaneous gas. Electronically Signed   By: Genevie Ann M.D.   On: 03/30/2016 11:48      Assessment/Plan Active Problems:   HTN (hypertension)   Diabetic neuropathy (HCC)   DM (diabetes mellitus) ([place])   Cellulitis   Diabetic foot infection ([place])   Uncontrolled type 2 diabetes  mellitus with hyperglycemia ([place])  Diabetic foot ulcer, right great toe -recent hospitalization for same and has worsened since discharge.  -MRI done 02/2016. Will await ortho consult to determine need to repeat. LE dopplers neg last admit.distal right posterior tibial artery as well as dorsalis pedis artery were evaluated and found to be patent with full flow. Do not see any reason to repeat at this time.   -empiric vanc and zosyn for now -will ask ortho to evaluate for possible amputation.  DM2, uncontrolled -A1C 13.7 02/24/16 -continue lantus, SSI  -monitor   DVT prophylaxis: SQ Lovenox Code Status: full code Family Communication: girlfriend and sisters at bedside at time of admit Disposition Plan: when medically ready Consults called: Dr Sharol Given Admission status: inpt med surg   Dionne Milo NP [facility] Pager [phone]  If 7PM-7AM, please contact night-coverage [email or web address] Password [facility]  03/30/2016, 3:56 PM

## 2016-03-30 NOTE — Addendum Note (Signed)
Addended by: Wonda Olds L on: 03/30/2016 11:30 AM   Modules accepted: Orders

## 2016-03-30 NOTE — Progress Notes (Signed)
[name] here for f/up DM. Patient c/o sore on left big toe that's been bleeding since last OV.   Patient states that his big toe is worse since last OV.  Patient requesting med refills.

## 2016-03-30 NOTE — Progress Notes (Signed)
Pharmacy Antibiotic Note  Jeffrey [name] is a 50 y.o. male admitted on 03/30/2016 with R/O Osteomyelitis.  Infected wound on right foot, increased swelling over several days.  Previously admitted for cellulitis of right big toe ulcer that developed into right foot cellulitis discharged with Keflex and Bactroban.  PMH includes non-compliant T2DM, with hyperglycemia on admission.  Pharmacy has been consulted for Vancomycin and Zosyn dosing.  On Admit: Afebrile, HR 98, RR 16, WBC 7.4 LA 1.93>2.03, CBG 332, SCr 0.79  Plan --Vancomycin 1500 mg IV x 1, then 1000 mg IV q8h --Zosyn 3.375 g IV q8h (extended infusion) --Obtain vanc trough at steady state (goal 15-20) --Follow renal function, clinical course and cultures      Temp (24hrs), Avg:98.2 F (36.8 C), Min:98 F (36.7 C), Max:98.3 F (36.8 C)   Recent Labs Lab 03/30/16 1110 03/30/16 1127 03/30/16 1430  WBC 7.4  --   --   CREATININE 0.79  --   --   LATICACIDVEN  --  1.93 2.03*    Estimated Creatinine Clearance: 114.2 mL/min (by C-G formula based on Cr of 0.79).    No Known Allergies  Antimicrobials this admission: 4/19 Vanc >>  4/19 Zosyn >>   Dose adjustments this admission:   Microbiology results:    Thank you for allowing pharmacy to be a part of this patient's care.  Viann Fish 03/30/2016 4:26 PM

## 2016-03-31 ENCOUNTER — Other Ambulatory Visit (HOSPITAL_COMMUNITY): Payer: Self-pay | Admitting: Family

## 2016-03-31 DIAGNOSIS — D509 Iron deficiency anemia, unspecified: Secondary | ICD-10-CM

## 2016-03-31 DIAGNOSIS — E11621 Type 2 diabetes mellitus with foot ulcer: Secondary | ICD-10-CM

## 2016-03-31 DIAGNOSIS — L089 Local infection of the skin and subcutaneous tissue, unspecified: Secondary | ICD-10-CM

## 2016-03-31 DIAGNOSIS — E876 Hypokalemia: Secondary | ICD-10-CM

## 2016-03-31 DIAGNOSIS — L97519 Non-pressure chronic ulcer of other part of right foot with unspecified severity: Secondary | ICD-10-CM

## 2016-03-31 DIAGNOSIS — E1169 Type 2 diabetes mellitus with other specified complication: Secondary | ICD-10-CM

## 2016-03-31 LAB — BASIC METABOLIC PANEL
ANION GAP: 11 (ref 5–15)
BUN: 7 mg/dL (ref 6–20)
CALCIUM: 8.6 mg/dL — AB (ref 8.9–10.3)
CO2: 23 mmol/L (ref 22–32)
CREATININE: 0.73 mg/dL (ref 0.61–1.24)
Chloride: 103 mmol/L (ref 101–111)
GFR calc Af Amer: >60 mL/min (ref >60)
GLUCOSE: 225 mg/dL — AB (ref 65–99)
Potassium: 3.2 mmol/L — ABNORMAL LOW (ref 3.5–5.1)
Sodium: 137 mmol/L (ref 135–145)

## 2016-03-31 LAB — FERRITIN: FERRITIN: 67 ng/mL (ref 24–336)

## 2016-03-31 LAB — CBC
HCT: 35.3 % — ABNORMAL LOW (ref 39.0–52.0)
HEMOGLOBIN: 10.8 g/dL — AB (ref 13.0–17.0)
MCH: 23.1 pg — AB (ref 26.0–34.0)
MCHC: 30.6 g/dL (ref 30.0–36.0)
MCV: 75.6 fL — ABNORMAL LOW (ref 78.0–100.0)
PLATELETS: 261 10*3/uL (ref 150–400)
RBC: 4.67 MIL/uL (ref 4.22–5.81)
RDW: 14.9 % (ref 11.5–15.5)
WBC: 4.8 10*3/uL (ref 4.0–10.5)

## 2016-03-31 LAB — GLUCOSE, CAPILLARY
GLUCOSE-CAPILLARY: 207 mg/dL — AB (ref 65–99)
GLUCOSE-CAPILLARY: 160 mg/dL — AB (ref 65–99)
GLUCOSE-CAPILLARY: 309 mg/dL — AB (ref 65–99)
Glucose-Capillary: 227 mg/dL — ABNORMAL HIGH (ref 65–99)

## 2016-03-31 LAB — SURGICAL PCR SCREEN
MRSA, PCR: NEGATIVE
Staphylococcus aureus: POSITIVE — AB

## 2016-03-31 LAB — IRON AND TIBC
Iron: 17 ug/dL — ABNORMAL LOW (ref 45–182)
SATURATION RATIOS: 7 % — AB (ref 17.9–39.5)
TIBC: 259 ug/dL (ref 250–450)
UIBC: 242 ug/dL

## 2016-03-31 LAB — MICROALBUMIN, URINE: MICROALB UR: 10.5 mg/dL

## 2016-03-31 MED ORDER — CHLORHEXIDINE GLUCONATE CLOTH 2 % EX PADS
6.0000 | MEDICATED_PAD | Freq: Every day | CUTANEOUS | Status: DC
  Administered 2016-04-01 – 2016-04-03 (×3): 6 via TOPICAL

## 2016-03-31 MED ORDER — CHLORHEXIDINE GLUCONATE 4 % EX LIQD
60.0000 mL | Freq: Once | CUTANEOUS | Status: AC
  Administered 2016-04-01: 4 via TOPICAL
  Filled 2016-03-31: qty 60

## 2016-03-31 MED ORDER — MUPIROCIN 2 % EX OINT
1.0000 "application " | TOPICAL_OINTMENT | Freq: Two times a day (BID) | CUTANEOUS | Status: DC
  Administered 2016-04-01 – 2016-04-03 (×6): 1 via NASAL
  Filled 2016-03-31: qty 22

## 2016-03-31 MED ORDER — CEFAZOLIN SODIUM-DEXTROSE 2-4 GM/100ML-% IV SOLN
2.0000 g | INTRAVENOUS | Status: AC
  Administered 2016-04-01: 2 g via INTRAVENOUS
  Filled 2016-03-31 (×2): qty 100

## 2016-03-31 MED ORDER — POTASSIUM CHLORIDE CRYS ER 20 MEQ PO TBCR
40.0000 meq | EXTENDED_RELEASE_TABLET | Freq: Once | ORAL | Status: AC
  Administered 2016-03-31: 40 meq via ORAL
  Filled 2016-03-31: qty 2

## 2016-03-31 NOTE — Progress Notes (Signed)
Pt and his family had questions about amputating just the right great toe and if that would solve the problem. They mentioned that they have seen cases where one toe gets amputated, then another and another and sometimes even all the way to the whole foot amputated. i did mention to them that this was a conversation they can have with surgeon before surgery tomorrow

## 2016-03-31 NOTE — Progress Notes (Signed)
PROGRESS NOTE                                                                                                                                                                                                             Patient Demographics:    Jeffrey [name], is a 50 y.o. male, DOB - [birth year], [id number]  Admit date - 03/30/2016   Admitting Physician Albertine Patricia, MD  Outpatient Primary MD for the patient is Arnoldo Morale, MD  LOS - 1  Outpatient Specialists:None  Chief Complaint  Patient presents with  . Wound Check  . Hyperglycemia       Brief Narrative   50 year old male with history of uncontrolled diabetes mellitus (A1c 13.7) with neuropathy, hypertension, medication noncompliance with hospitalization one month back for right great toe infection with cellulitis, was treated with antibiotic and discharged home. However the infection seemed worse. Patient seen by PCP and sent to ED with concern for osteomyelitis.   Subjective:    patient denies any pain in his leg. Has poor sensation his rt foot.   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 Seen by orthopedics Dr. Sharol Given and given recurrent draining infection with failed IV antibiotics recommends amputation of the right great toe on 4/21.  continue empiric antibiotics.   Active Problems: Uncontrolled type 2 diabetes mellitus with diabetic neuropathy and diabetic ulcer A1c >13. Increased Lantus dose with sliding scale coverage. Concern for medication nonadherence. Girlfriend informs that patient's CBG ranges in 300s-400s. Emphasized on diet and medication adherence for tight blood glucose control. Continue Neurontin.   Hypokalemia Replenished.  Microcytic anemia Check iron panel      Code Status : Full code  Family Communication  : Friend at  bedside  Disposition Plan  : Home possibly 48 hours after surgery  Barriers For Discharge :  Right great toe amputation on 4/21  Consults  : Dr. Sharol Given  Procedures  : Right fifth toe amputation scheduled  DVT Prophylaxis  :  Lovenox -   Lab Results  Component Value Date   PLT 261 03/31/2016    Antibiotics  :  IV vancomycin and Zosyn since 4/20  Anti-infectives    Start     Dose/Rate Route Frequency Ordered Stop   03/31/16  0530  vancomycin (VANCOCIN) IVPB 1000 mg/200 mL premix     1,000 mg 200 mL/hr over 60 Minutes Intravenous Every 8 hours 03/30/16 2031     03/30/16 2030  piperacillin-tazobactam (ZOSYN) IVPB 3.375 g     3.375 g 12.5 mL/hr over 240 Minutes Intravenous Every 8 hours 03/30/16 1652     03/30/16 1915  vancomycin (VANCOCIN) 1,500 mg in sodium chloride 0.9 % 500 mL IVPB     1,500 mg 250 mL/hr over 120 Minutes Intravenous  Once 03/30/16 1911 03/30/16 2128   03/30/16 1630  vancomycin (VANCOCIN) 1,500 mg in sodium chloride 0.9 % 500 mL IVPB     1,500 mg 250 mL/hr over 120 Minutes Intravenous  Once 03/30/16 1624     03/30/16 1400  piperacillin-tazobactam (ZOSYN) IVPB 3.375 g     3.375 g 100 mL/hr over 30 Minutes Intravenous  Once 03/30/16 1356 03/30/16 1535        Objective:   Filed Vitals:   03/30/16 2215 03/31/16 0639 03/31/16 0954 03/31/16 1202  BP: 125/82 120/91 130/98 129/80  Pulse: 89 87 88 87  Temp: 98.9 F (37.2 C) 98.6 F (37 C) 98.3 F (36.8 C) 98.2 F (36.8 C)  TempSrc: Oral  Oral Oral  Resp: 19 18 16 18   SpO2: 96% 95% 97% 99%    Wt Readings from Last 3 Encounters:  03/30/16 85.458 kg (188 lb 6.4 oz)  02/24/16 85.73 kg (189 lb)  02/18/16 83.462 kg (184 lb)     Intake/Output Summary (Last 24 hours) at 03/31/16 1313 Last data filed at 03/31/16 0640  Gross per 24 hour  Intake 1446.25 ml  Output    850 ml  Net 596.25 ml     Physical Exam  Gen: not in distress HEENT: no pallor, moist mucosa, supple neck Chest: clear b/l, no added  sounds CVS: N S1&S2, no murmurs, rubs or gallop GI: soft, NT, ND, BS+ Musculoskeletal: warm, no edema, right great toe cellulitis with ulceration CNS: Alert and oriented, nonfocal    Data Review:    CBC  Recent Labs Lab 03/30/16 1110 03/30/16 1716 03/31/16 0240  WBC 7.4 6.5 4.8  HGB 12.4* 11.3* 10.8*  HCT 38.8* 36.6* 35.3*  PLT 269 286 261  MCV 75.6* 75.8* 75.6*  MCH 24.2* 23.4* 23.1*  MCHC 32.0 30.9 30.6  RDW 14.8 14.8 14.9    Chemistries   Recent Labs Lab 03/30/16 1110 03/30/16 1716 03/31/16 0240  NA 134*  --  137  K 3.8  --  3.2*  CL 102  --  103  CO2 23  --  23  GLUCOSE 300*  --  225*  BUN 9  --  7  CREATININE 0.79 0.84 0.73  CALCIUM 9.6  --  8.6*   ------------------------------------------------------------------------------------------------------------------ No results for input(s): CHOL, HDL, LDLCALC, TRIG, CHOLHDL, LDLDIRECT in the last 72 hours.  Lab Results  Component Value Date   HGBA1C 13.7 02/24/2016   ------------------------------------------------------------------------------------------------------------------ No results for input(s): TSH, T4TOTAL, T3FREE, THYROIDAB in the last 72 hours.  Invalid input(s): FREET3 ------------------------------------------------------------------------------------------------------------------ No results for input(s): VITAMINB12, FOLATE, FERRITIN, TIBC, IRON, RETICCTPCT in the last 72 hours.  Coagulation profile No results for input(s): INR, PROTIME in the last 168 hours.  No results for input(s): DDIMER in the last 72 hours.  Cardiac Enzymes No results for input(s): CKMB, TROPONINI, MYOGLOBIN in the last 168 hours.  Invalid input(s): CK ------------------------------------------------------------------------------------------------------------------    Component Value Date/Time   BNP 10.0 12/05/2014 0013    Inpatient  Medications  Scheduled Meds: . enoxaparin (LOVENOX) injection  40 mg  Subcutaneous Q24H  . gabapentin  400 mg Oral TID  . insulin aspart  0-20 Units Subcutaneous TID WC  . insulin glargine  50 Units Subcutaneous QHS  . lisinopril  10 mg Oral Daily  . piperacillin-tazobactam (ZOSYN)  IV  3.375 g Intravenous Q8H  . vancomycin  1,500 mg Intravenous Once  . vancomycin  1,000 mg Intravenous Q8H   Continuous Infusions: . sodium chloride 75 mL/hr at 03/30/16 1851   PRN Meds:.acetaminophen **OR** acetaminophen, HYDROcodone-acetaminophen, lactulose, ondansetron **OR** ondansetron (ZOFRAN) IV, senna-docusate  Micro Results No results found for this or any previous visit (from the past 240 hour(s)).  Radiology Reports Dg Foot Complete Right  03/30/2016  CLINICAL DATA:  50 year old male with diabetic wound right big toe for 1 month. Pain spreading into the proximal foot. Subsequent encounter. EXAM: RIGHT FOOT COMPLETE - 3+ VIEW COMPARISON:  Right forefoot MRI 02/19/2016. Right foot radiographs 02/18/2016. FINDINGS: Dressing material about the right great toe. The right first phalanges appear intact with no cortical osteolysis identified. Questionable mild narrowing of the right first IP joint since March. The metatarsals and other phalanges also appear stable and intact. Calcified peripheral vascular disease again noted. Worsening soft tissue swelling about the distal foot. No subcutaneous gas. Hindfoot appears stable and intact. IMPRESSION: Worsening soft tissue swelling in the distal right foot but no plain radiographic features of osteomyelitis at this time. No subcutaneous gas. Electronically Signed   By: Genevie Ann M.D.   On: 03/30/2016 11:48    Time Spent in minutes  25   Louellen Molder M.D on 03/31/2016 at 1:13 PM  Between 7am to 7pm - Pager - [phone]  After 7pm go to [email or web address] - password [email or web address]  [facility] -  Office  [phone]

## 2016-03-31 NOTE — Consult Note (Signed)
WOC wound consult note Reason for Consult:Right great toe infection with open full thickness ulcerations. Dr. Sharol Given (Orthopedics) has been consulted; interim orders required. Patient denies pain. Wound type:Infectious Pressure Ulcer POA: No Measurement:Circumferential involvement. Two distinct areas: a distal plantar surface ulcer measuring 1.4cnm x 2cm x 0.1cm, red, moist, non-granulating and a 0.8cm x 5.5cm wound around the base of the toe.  There is maceration from poorly managed dressing changes. Wound bed:As described above Drainage (amount, consistency, odor) Serous Periwound: Macerated as described above and edematous. Dressing procedure/placement/frequency: Until the consultation with Dr. Sharol Given, I will provide conservative orders for an antimicrobial dressing (xeroform) to be applied and changed daily. The astringent properties will additionally help with maceration management. I will defer further orders regarding the POC for this digit  (operative or medical management) to Dr. Sharol Given. [place] nursing team will not follow, but will remain available to this patient, the nursing and medical teams.  Please re-consult if needed. Thanks, Maudie Flakes, MSN, RN, [place], Arther Abbott  Pager# [phone]

## 2016-03-31 NOTE — Consult Note (Signed)
ORTHOPAEDIC CONSULTATION  REQUESTING PHYSICIAN: Louellen Molder, MD  Chief Complaint: Recurrent infection with drainage and swelling right great toe  HPI: Jeffrey [name] is a 50 y.o. male who presents with worsening infection of right great toe. Patient was recently hospitalized on IV antibiotics and discharged on oral antibiotics. While at home patient had increased bleeding with increased drainage and increased swelling from the right great toe plantar ulcer.  Past Medical History  Diagnosis Date  . Diabetes mellitus   . HTN (hypertension)    Past Surgical History  Procedure Laterality Date  . Back surgery      for abscess   Social History   Social History  . Marital Status: Significant Other    Spouse Name: N/A  . Number of Children: N/A  . Years of Education: N/A   Social History Main Topics  . Smoking status: Never Smoker   . Smokeless tobacco: None  . Alcohol Use: No  . Drug Use: No  . Sexual Activity: Not Asked   Other Topics Concern  . None   Social History Narrative   Family History  Problem Relation Age of Onset  . Diabetes Mother   . Hypertension Mother   . Diabetes Sister    - negative except otherwise stated in the family history section No Known Allergies Prior to Admission medications   Medication Sig Start Date End Date Taking? Authorizing Provider  gabapentin (NEURONTIN) 400 MG capsule Take 1 capsule (400 mg total) by mouth 3 (three) times daily. 03/30/16  Yes Arnoldo Morale, MD  insulin aspart (NOVOLOG) 100 UNIT/ML injection Inject 0-15 Units into the skin 3 (three) times daily with meals. Sliding scale  CBG 70 - 120: 0 units: CBG 121 - 150: 2 units; CBG 151 - 200: 3 units; CBG 201 - 250: 5 units; CBG 251 - 300: 8 units;CBG 301 - 350: 11 units; CBG 351 - 400: 15 units; CBG > 400 : 15 units and notify your  MD 03/30/16  Yes Arnoldo Morale, MD  Insulin Glargine (LANTUS SOLOSTAR) 100 UNIT/ML Solostar Pen Inject 50 Units into the skin daily at 10  pm. 03/30/16  Yes Arnoldo Morale, MD  lactulose (CHRONULAC) 10 GM/15ML solution Take 15 mLs (10 g total) by mouth 2 (two) times daily as needed for mild constipation. 02/24/16  Yes Arnoldo Morale, MD  lisinopril (PRINIVIL,ZESTRIL) 10 MG tablet Take 1 tablet (10 mg total) by mouth daily. 03/30/16  Yes Arnoldo Morale, MD  HYDROcodone-acetaminophen (NORCO/VICODIN) 5-325 MG tablet Take 1-2 tablets by mouth every 6 (six) hours as needed for moderate pain or severe pain. Patient not taking: Reported on 03/30/2016 02/20/16   Annita Brod, MD  Insulin Pen Needle 31G X 5 MM MISC Check blood sugar TID & QHS Patient not taking: Reported on 03/30/2016 03/04/15   Lorayne Marek, MD  Insulin Syringe-Needle U-100 (INSULIN SYRINGE 1CC/31GX5/16") 31G X 5/16" 1 ML MISC Check blood sugar TID & QHS 03/04/15   Lorayne Marek, MD  mupirocin cream (BACTROBAN) 2 % Apply topically daily. Apply Bactroban to right great toe once a day and cover with foam dressing.  (Change foam dressing every 5 days or sooner if needed if dirty.) Patient not taking: Reported on 03/30/2016 02/20/16   Annita Brod, MD   Dg Foot Complete Right  03/30/2016  CLINICAL DATA:  50 year old male with diabetic wound right big toe for 1 month. Pain spreading into the proximal foot. Subsequent encounter. EXAM: RIGHT FOOT COMPLETE - 3+ VIEW COMPARISON:  Right forefoot MRI 02/19/2016. Right foot radiographs 02/18/2016. FINDINGS: Dressing material about the right great toe. The right first phalanges appear intact with no cortical osteolysis identified. Questionable mild narrowing of the right first IP joint since March. The metatarsals and other phalanges also appear stable and intact. Calcified peripheral vascular disease again noted. Worsening soft tissue swelling about the distal foot. No subcutaneous gas. Hindfoot appears stable and intact. IMPRESSION: Worsening soft tissue swelling in the distal right foot but no plain radiographic features of osteomyelitis at this  time. No subcutaneous gas. Electronically Signed   By: Genevie Ann M.D.   On: 03/30/2016 11:48   - pertinent xrays, CT, MRI studies were reviewed and independently interpreted  Positive ROS: All other systems have been reviewed and were otherwise negative with the exception of those mentioned in the HPI and as above.  Physical Exam: General: Alert, no acute distress Cardiovascular: No pedal edema Respiratory: No cyanosis, no use of accessory musculature GI: No organomegaly, abdomen is soft and non-tender Skin: Draining ulcer plantar aspect of the right great toe. Patient does not have protective sensation. Neurologic: Diabetic insensate neuropathy without protective sensation. Psychiatric: Patient is competent for consent with normal mood and affect Lymphatic: No axillary or cervical lymphadenopathy  MUSCULOSKELETAL:  Patient has a strong dorsalis pedis pulse. He has swelling of the right great toe with a large ulcer cellulitis and drainage. Review of the MRI scan does show increased edema which does appear to be consistent with osteomyelitis of the tuft of the great toe. Repeat radiographs shows no significant destructive changes.  Assessment: Assessment: Diabetic insensate neuropathy with [name] grade 3 ulcer right great toe with ulceration, drainage, osteomyelitis and cellulitis.  Plan: Have discussed with the patient there is failure of IV antibiotics would recommend proceeding with amputation of the right great toe. Patient and family state they wish to proceed with an amputation of the right great toe through the MTP joint. I will set up surgery for Friday afternoon.  Thank you for the consult and the opportunity to see Jeffrey [name], [place] [phone] 7:23 AM

## 2016-04-01 ENCOUNTER — Inpatient Hospital Stay (HOSPITAL_COMMUNITY): Payer: Self-pay | Admitting: Certified Registered"

## 2016-04-01 ENCOUNTER — Encounter (HOSPITAL_COMMUNITY)
Admission: EM | Disposition: A | Discharge status: Home / Self Care | Payer: Self-pay | Source: Home / Self Care | Attending: Internal Medicine

## 2016-04-01 ENCOUNTER — Encounter (HOSPITAL_COMMUNITY): Payer: Self-pay | Admitting: General Practice

## 2016-04-01 DIAGNOSIS — I10 Essential (primary) hypertension: Secondary | ICD-10-CM

## 2016-04-01 DIAGNOSIS — Z794 Long term (current) use of insulin: Secondary | ICD-10-CM

## 2016-04-01 DIAGNOSIS — E1165 Type 2 diabetes mellitus with hyperglycemia: Secondary | ICD-10-CM

## 2016-04-01 HISTORY — PX: AMPUTATION: SHX166

## 2016-04-01 LAB — GLUCOSE, CAPILLARY
GLUCOSE-CAPILLARY: 110 mg/dL — AB (ref 65–99)
GLUCOSE-CAPILLARY: 105 mg/dL — AB (ref 65–99)
GLUCOSE-CAPILLARY: 121 mg/dL — AB (ref 65–99)
Glucose-Capillary: 145 mg/dL — ABNORMAL HIGH (ref 65–99)
Glucose-Capillary: 248 mg/dL — ABNORMAL HIGH (ref 65–99)

## 2016-04-01 LAB — VANCOMYCIN, TROUGH: Vancomycin Tr: 21 ug/mL — ABNORMAL HIGH (ref 10.0–20.0)

## 2016-04-01 SURGERY — AMPUTATION DIGIT
Anesthesia: General | Laterality: Right

## 2016-04-01 MED ORDER — FERROUS SULFATE 325 (65 FE) MG PO TABS
325.0000 mg | ORAL_TABLET | Freq: Two times a day (BID) | ORAL | Status: DC
  Administered 2016-04-01 – 2016-04-03 (×5): 325 mg via ORAL
  Filled 2016-04-01 (×5): qty 1

## 2016-04-01 MED ORDER — PROPOFOL 10 MG/ML IV BOLUS
INTRAVENOUS | Status: DC | PRN
  Administered 2016-04-01: 180 mg via INTRAVENOUS

## 2016-04-01 MED ORDER — OXYCODONE HCL 5 MG/5ML PO SOLN: 5.0000 mg | Freq: Once | ORAL | Status: DC | PRN

## 2016-04-01 MED ORDER — ACETAMINOPHEN 325 MG PO TABS: 650.0000 mg | ORAL_TABLET | Freq: Four times a day (QID) | ORAL | Status: DC | PRN

## 2016-04-01 MED ORDER — MIDAZOLAM HCL 2 MG/2ML IJ SOLN
INTRAMUSCULAR | Status: AC
  Filled 2016-04-01: qty 2

## 2016-04-01 MED ORDER — METOCLOPRAMIDE HCL 5 MG PO TABS: 5.0000 mg | ORAL_TABLET | Freq: Three times a day (TID) | ORAL | Status: DC | PRN

## 2016-04-01 MED ORDER — SODIUM CHLORIDE 0.9 % IV SOLN
INTRAVENOUS | Status: DC
  Administered 2016-04-01: 19:00:00 via INTRAVENOUS

## 2016-04-01 MED ORDER — LACTATED RINGERS IV SOLN
INTRAVENOUS | Status: DC
  Administered 2016-04-01: 16:00:00 via INTRAVENOUS

## 2016-04-01 MED ORDER — ACETAMINOPHEN 160 MG/5ML PO SOLN: 325.0000 mg | ORAL | Status: DC | PRN

## 2016-04-01 MED ORDER — EPHEDRINE SULFATE 50 MG/ML IJ SOLN
INTRAMUSCULAR | Status: AC
  Filled 2016-04-01: qty 1

## 2016-04-01 MED ORDER — HYDROMORPHONE HCL 1 MG/ML IJ SOLN: 1.0000 mg | INTRAMUSCULAR | Status: DC | PRN

## 2016-04-01 MED ORDER — METOCLOPRAMIDE HCL 5 MG/ML IJ SOLN: 5.0000 mg | Freq: Three times a day (TID) | INTRAMUSCULAR | Status: DC | PRN

## 2016-04-01 MED ORDER — FENTANYL CITRATE (PF) 250 MCG/5ML IJ SOLN
INTRAMUSCULAR | Status: DC | PRN
  Administered 2016-04-01: 100 ug via INTRAVENOUS

## 2016-04-01 MED ORDER — 0.9 % SODIUM CHLORIDE (POUR BTL) OPTIME
TOPICAL | Status: DC | PRN
  Administered 2016-04-01: 1000 mL

## 2016-04-01 MED ORDER — METHOCARBAMOL 500 MG PO TABS
500.0000 mg | ORAL_TABLET | Freq: Four times a day (QID) | ORAL | Status: DC | PRN
  Administered 2016-04-01: 500 mg via ORAL
  Filled 2016-04-01: qty 1

## 2016-04-01 MED ORDER — ONDANSETRON HCL 4 MG PO TABS: 4.0000 mg | ORAL_TABLET | Freq: Four times a day (QID) | ORAL | Status: DC | PRN

## 2016-04-01 MED ORDER — FENTANYL CITRATE (PF) 250 MCG/5ML IJ SOLN
INTRAMUSCULAR | Status: AC
  Filled 2016-04-01: qty 5

## 2016-04-01 MED ORDER — OXYCODONE HCL 5 MG PO TABS: 5.0000 mg | ORAL_TABLET | Freq: Once | ORAL | Status: DC | PRN

## 2016-04-01 MED ORDER — ACETAMINOPHEN 650 MG RE SUPP: 650.0000 mg | Freq: Four times a day (QID) | RECTAL | Status: DC | PRN

## 2016-04-01 MED ORDER — MIDAZOLAM HCL 5 MG/5ML IJ SOLN
INTRAMUSCULAR | Status: DC | PRN
  Administered 2016-04-01: 2 mg via INTRAVENOUS

## 2016-04-01 MED ORDER — ACETAMINOPHEN 325 MG PO TABS: 325.0000 mg | ORAL_TABLET | ORAL | Status: DC | PRN

## 2016-04-01 MED ORDER — OXYCODONE HCL 5 MG PO TABS
5.0000 mg | ORAL_TABLET | ORAL | Status: DC | PRN
  Administered 2016-04-02: 10 mg via ORAL
  Filled 2016-04-01: qty 2

## 2016-04-01 MED ORDER — ONDANSETRON HCL 4 MG/2ML IJ SOLN: 4.0000 mg | Freq: Four times a day (QID) | INTRAMUSCULAR | Status: DC | PRN

## 2016-04-01 MED ORDER — METHOCARBAMOL 1000 MG/10ML IJ SOLN
500.0000 mg | Freq: Four times a day (QID) | INTRAMUSCULAR | Status: DC | PRN
  Filled 2016-04-01: qty 5

## 2016-04-01 MED ORDER — FENTANYL CITRATE (PF) 100 MCG/2ML IJ SOLN: 25.0000 ug | INTRAMUSCULAR | Status: DC | PRN

## 2016-04-01 MED ORDER — LIDOCAINE HCL (CARDIAC) 20 MG/ML IV SOLN
INTRAVENOUS | Status: DC | PRN
  Administered 2016-04-01: 40 mg via INTRAVENOUS

## 2016-04-01 MED ORDER — ONDANSETRON HCL 4 MG/2ML IJ SOLN
INTRAMUSCULAR | Status: DC | PRN
  Administered 2016-04-01: 4 mg via INTRAVENOUS

## 2016-04-01 MED ORDER — SODIUM CHLORIDE 0.9 % IJ SOLN
INTRAMUSCULAR | Status: AC
  Filled 2016-04-01: qty 10

## 2016-04-01 MED ORDER — INSULIN ASPART 100 UNIT/ML ~~LOC~~ SOLN
4.0000 [IU] | Freq: Once | SUBCUTANEOUS | Status: AC
  Administered 2016-04-01: 4 [IU] via SUBCUTANEOUS

## 2016-04-01 MED ORDER — LIDOCAINE HCL (CARDIAC) 20 MG/ML IV SOLN
INTRAVENOUS | Status: AC
  Filled 2016-04-01: qty 5

## 2016-04-01 SURGICAL SUPPLY — 34 items
BLADE SURG 21 STRL SS (BLADE) ×3 IMPLANT
BNDG CMPR 9X4 STRL LF SNTH (GAUZE/BANDAGES/DRESSINGS)
BNDG COHESIVE 4X5 TAN STRL (GAUZE/BANDAGES/DRESSINGS) ×3 IMPLANT
BNDG COHESIVE 6X5 TAN STRL LF (GAUZE/BANDAGES/DRESSINGS) ×2 IMPLANT
BNDG ESMARK 4X9 LF (GAUZE/BANDAGES/DRESSINGS) IMPLANT
BNDG GAUZE ELAST 4 BULKY (GAUZE/BANDAGES/DRESSINGS) ×3 IMPLANT
COVER SURGICAL LIGHT HANDLE (MISCELLANEOUS) ×4 IMPLANT
DRAPE U-SHAPE 47X51 STRL (DRAPES) ×3 IMPLANT
DRSG ADAPTIC 3X8 NADH LF (GAUZE/BANDAGES/DRESSINGS) ×2 IMPLANT
DRSG PAD ABDOMINAL 8X10 ST (GAUZE/BANDAGES/DRESSINGS) ×3 IMPLANT
DURAPREP 26ML APPLICATOR (WOUND CARE) ×3 IMPLANT
ELECT REM PT RETURN 9FT ADLT (ELECTROSURGICAL) ×3
ELECTRODE REM PT RTRN 9FT ADLT (ELECTROSURGICAL) ×1 IMPLANT
GAUZE SPONGE 4X4 12PLY STRL (GAUZE/BANDAGES/DRESSINGS) ×2 IMPLANT
GLOVE BIOGEL PI IND STRL 9 (GLOVE) ×1 IMPLANT
GLOVE BIOGEL PI INDICATOR 9 (GLOVE) ×2
GLOVE SURG ORTHO 9.0 STRL STRW (GLOVE) ×3 IMPLANT
GOWN EXTRA PROTECTION XL (GOWNS) ×4 IMPLANT
GOWN STRL REUS W/ TWL XL LVL3 (GOWN DISPOSABLE) ×2 IMPLANT
GOWN STRL REUS W/TWL XL LVL3 (GOWN DISPOSABLE) ×3
KIT BASIN OR (CUSTOM PROCEDURE TRAY) ×3 IMPLANT
KIT ROOM TURNOVER OR (KITS) ×3 IMPLANT
MANIFOLD NEPTUNE II (INSTRUMENTS) ×3 IMPLANT
NEEDLE 22X1 1/2 (OR ONLY) (NEEDLE) IMPLANT
NS IRRIG 1000ML POUR BTL (IV SOLUTION) ×3 IMPLANT
PACK ORTHO EXTREMITY (CUSTOM PROCEDURE TRAY) ×3 IMPLANT
PAD ARMBOARD 7.5X6 YLW CONV (MISCELLANEOUS) ×4 IMPLANT
SPONGE GAUZE 4X4 12PLY STER LF (GAUZE/BANDAGES/DRESSINGS) ×2 IMPLANT
SUCTION FRAZIER HANDLE 10FR (MISCELLANEOUS)
SUCTION TUBE FRAZIER 10FR DISP (MISCELLANEOUS) IMPLANT
SUT ETHILON 2 0 PSLX (SUTURE) ×3 IMPLANT
SYR CONTROL 10ML LL (SYRINGE) IMPLANT
TOWEL OR 17X24 6PK STRL BLUE (TOWEL DISPOSABLE) ×3 IMPLANT
TOWEL OR 17X26 10 PK STRL BLUE (TOWEL DISPOSABLE) ×3 IMPLANT

## 2016-04-01 NOTE — H&P (View-Only) (Signed)
ORTHOPAEDIC CONSULTATION  REQUESTING PHYSICIAN: Louellen Molder, MD  Chief Complaint: Recurrent infection with drainage and swelling right great toe  HPI: Jeffrey [name] is a 50 y.o. male who presents with worsening infection of right great toe. Patient was recently hospitalized on IV antibiotics and discharged on oral antibiotics. While at home patient had increased bleeding with increased drainage and increased swelling from the right great toe plantar ulcer.  Past Medical History  Diagnosis Date  . Diabetes mellitus   . HTN (hypertension)    Past Surgical History  Procedure Laterality Date  . Back surgery      for abscess   Social History   Social History  . Marital Status: Significant Other    Spouse Name: N/A  . Number of Children: N/A  . Years of Education: N/A   Social History Main Topics  . Smoking status: Never Smoker   . Smokeless tobacco: None  . Alcohol Use: No  . Drug Use: No  . Sexual Activity: Not Asked   Other Topics Concern  . None   Social History Narrative   Family History  Problem Relation Age of Onset  . Diabetes Mother   . Hypertension Mother   . Diabetes Sister    - negative except otherwise stated in the family history section No Known Allergies Prior to Admission medications   Medication Sig Start Date End Date Taking? Authorizing Provider  gabapentin (NEURONTIN) 400 MG capsule Take 1 capsule (400 mg total) by mouth 3 (three) times daily. 03/30/16  Yes Arnoldo Morale, MD  insulin aspart (NOVOLOG) 100 UNIT/ML injection Inject 0-15 Units into the skin 3 (three) times daily with meals. Sliding scale  CBG 70 - 120: 0 units: CBG 121 - 150: 2 units; CBG 151 - 200: 3 units; CBG 201 - 250: 5 units; CBG 251 - 300: 8 units;CBG 301 - 350: 11 units; CBG 351 - 400: 15 units; CBG > 400 : 15 units and notify your  MD 03/30/16  Yes Arnoldo Morale, MD  Insulin Glargine (LANTUS SOLOSTAR) 100 UNIT/ML Solostar Pen Inject 50 Units into the skin daily at 10  pm. 03/30/16  Yes Arnoldo Morale, MD  lactulose (CHRONULAC) 10 GM/15ML solution Take 15 mLs (10 g total) by mouth 2 (two) times daily as needed for mild constipation. 02/24/16  Yes Arnoldo Morale, MD  lisinopril (PRINIVIL,ZESTRIL) 10 MG tablet Take 1 tablet (10 mg total) by mouth daily. 03/30/16  Yes Arnoldo Morale, MD  HYDROcodone-acetaminophen (NORCO/VICODIN) 5-325 MG tablet Take 1-2 tablets by mouth every 6 (six) hours as needed for moderate pain or severe pain. Patient not taking: Reported on 03/30/2016 02/20/16   Annita Brod, MD  Insulin Pen Needle 31G X 5 MM MISC Check blood sugar TID & QHS Patient not taking: Reported on 03/30/2016 03/04/15   Lorayne Marek, MD  Insulin Syringe-Needle U-100 (INSULIN SYRINGE 1CC/31GX5/16") 31G X 5/16" 1 ML MISC Check blood sugar TID & QHS 03/04/15   Lorayne Marek, MD  mupirocin cream (BACTROBAN) 2 % Apply topically daily. Apply Bactroban to right great toe once a day and cover with foam dressing.  (Change foam dressing every 5 days or sooner if needed if dirty.) Patient not taking: Reported on 03/30/2016 02/20/16   Annita Brod, MD   Dg Foot Complete Right  03/30/2016  CLINICAL DATA:  50 year old male with diabetic wound right big toe for 1 month. Pain spreading into the proximal foot. Subsequent encounter. EXAM: RIGHT FOOT COMPLETE - 3+ VIEW COMPARISON:  Right forefoot MRI 02/19/2016. Right foot radiographs 02/18/2016. FINDINGS: Dressing material about the right great toe. The right first phalanges appear intact with no cortical osteolysis identified. Questionable mild narrowing of the right first IP joint since March. The metatarsals and other phalanges also appear stable and intact. Calcified peripheral vascular disease again noted. Worsening soft tissue swelling about the distal foot. No subcutaneous gas. Hindfoot appears stable and intact. IMPRESSION: Worsening soft tissue swelling in the distal right foot but no plain radiographic features of osteomyelitis at this  time. No subcutaneous gas. Electronically Signed   By: Genevie Ann M.D.   On: 03/30/2016 11:48   - pertinent xrays, CT, MRI studies were reviewed and independently interpreted  Positive ROS: All other systems have been reviewed and were otherwise negative with the exception of those mentioned in the HPI and as above.  Physical Exam: General: Alert, no acute distress Cardiovascular: No pedal edema Respiratory: No cyanosis, no use of accessory musculature GI: No organomegaly, abdomen is soft and non-tender Skin: Draining ulcer plantar aspect of the right great toe. Patient does not have protective sensation. Neurologic: Diabetic insensate neuropathy without protective sensation. Psychiatric: Patient is competent for consent with normal mood and affect Lymphatic: No axillary or cervical lymphadenopathy  MUSCULOSKELETAL:  Patient has a strong dorsalis pedis pulse. He has swelling of the right great toe with a large ulcer cellulitis and drainage. Review of the MRI scan does show increased edema which does appear to be consistent with osteomyelitis of the tuft of the great toe. Repeat radiographs shows no significant destructive changes.  Assessment: Assessment: Diabetic insensate neuropathy with [name] grade 3 ulcer right great toe with ulceration, drainage, osteomyelitis and cellulitis.  Plan: Have discussed with the patient there is failure of IV antibiotics would recommend proceeding with amputation of the right great toe. Patient and family state they wish to proceed with an amputation of the right great toe through the MTP joint. I will set up surgery for Friday afternoon.  Thank you for the consult and the opportunity to see Mr. Jeffrey [name], [place] [phone] 7:23 AM

## 2016-04-01 NOTE — Progress Notes (Signed)
Asked Pt. If he wanted a bath and family member of the pt. Said to set  Everything up and she will help Pt. Bathed after breakfast .

## 2016-04-01 NOTE — Progress Notes (Signed)
Pt returned from pacu s/p right great toe amputation. Alert, oriented and able to voice needs. Denies pain.

## 2016-04-01 NOTE — Transfer of Care (Signed)
Immediate Anesthesia Transfer of Care Note  Patient: Jeffrey [name]  Procedure(s) Performed: Procedure(s): Right Great Toe Amputation (Right)  Patient Location: PACU  Anesthesia Type:General  Level of Consciousness: lethargic and responds to stimulation  Airway & Oxygen Therapy: Patient Spontanous Breathing and Patient connected to nasal cannula oxygen  Post-op Assessment: Report given to RN  Post vital signs: Reviewed and stable  Last Vitals:  Filed Vitals:   04/01/16 0537 04/01/16 1337  BP: 132/81 146/87  Pulse: 81 75  Temp: 37.2 C 37.2 C  Resp: 18 18    Complications: No apparent anesthesia complications

## 2016-04-01 NOTE — Progress Notes (Signed)
Inpatient Diabetes Program Recommendations  AACE/ADA: New Consensus Statement on Inpatient Glycemic Control (2015)  Target Ranges:  Prepandial:   less than 140 mg/dL      Peak postprandial:   less than 180 mg/dL (1-2 hours)      Critically ill patients:  140 - 180 mg/dL   Results for RAINIER, BINGER (MRN [id number]) as of 04/01/2016 09:49  Ref. Range 03/31/2016 08:29 03/31/2016 11:47 03/31/2016 17:15 03/31/2016 21:18 04/01/2016 07:29  Glucose-Capillary Latest Ref Range: 65-99 mg/dL 207 (H) 160 (H) 309 (H) 227 (H) 145 (H)   Review of Glycemic Control  Diabetes history: DM 2 Outpatient Diabetes medications: Lantus 50 units, Novolog Moderate TID Current orders for Inpatient glycemic control: Lantus 50 units, Novolog Resistant TID  Inpatient Diabetes Program Recommendations: Insulin - Meal Coverage: Glucose increased with meals into the 300's yesterday. May want to consider starting Novolog meal coverage 5 units TID when patient is eating at least 50% of meals.  Thanks,  Tama Headings RN, MSN, [facility] Inpatient Diabetes Coordinator Team Pager [phone] (8a-5p)

## 2016-04-01 NOTE — Progress Notes (Signed)
Pharmacy Antibiotic Note  Jeffrey [name] is a 50 y.o. male admitted on 03/30/2016 with R/O Osteomyelitis.  Infected wound on right foot, increased swelling over several days.  Previously admitted for cellulitis of right big toe ulcer that developed into right foot cellulitis discharged with Keflex and Bactroban.  PMH includes non-compliant T2DM, with hyperglycemia on admission.  Pharmacy has been consulted for Vancomycin and Zosyn dosing.  Vancomycin trough a little elevated at 21   Plan Decrease Vancomycin to 750 mg iv Q 8 hours  Continue Zosyn at current dose  Consider stopping antibiotics now s/p amputation?     Temp (24hrs), Avg:98.6 F (37 C), Min:98.1 F (36.7 C), Max:99 F (37.2 C)   Recent Labs Lab 03/30/16 1110 03/30/16 1127 03/30/16 1430 03/30/16 1716 03/31/16 0240 04/01/16 2050  WBC 7.4  --   --  6.5 4.8  --   CREATININE 0.79  --   --  0.84 0.73  --   LATICACIDVEN  --  1.93 2.03*  --   --   --   VANCOTROUGH  --   --   --   --   --  21*    Estimated Creatinine Clearance: 114.2 mL/min (by C-G formula based on Cr of 0.73).    No Known Allergies  Antimicrobials this admission: 4/19 Vanc >>  4/19 Zosyn >>   Dose adjustments this admission:   Microbiology results:    Thank you for allowing pharmacy to be a part of this patient's care.  Tad Moore 04/01/2016 10:04 PM

## 2016-04-01 NOTE — Interval H&P Note (Signed)
History and Physical Interval Note:  04/01/2016 6:51 AM  Jeffrey [name]  has presented today for surgery, with the diagnosis of Osteomyelitis Right Great Toe  The various methods of treatment have been discussed with the patient and family. After consideration of risks, benefits and other options for treatment, the patient has consented to  Procedure(s): Right Great Toe Amputation (Right) as a surgical intervention .  The patient's history has been reviewed, patient examined, no change in status, stable for surgery.  I have reviewed the patient's chart and labs.  Questions were answered to the patient's satisfaction.     DUDA,MARCUS V

## 2016-04-01 NOTE — Op Note (Signed)
03/30/2016 - 04/01/2016  4:57 PM  PATIENT:  Jeffrey [name]    PRE-OPERATIVE DIAGNOSIS:  Osteomyelitis Right Great Toe  POST-OPERATIVE DIAGNOSIS:  Same  PROCEDURE:  Right Great Toe Amputation  SURGEON:  Newt Minion, MD  PHYSICIAN ASSISTANT:None ANESTHESIA:   General  PREOPERATIVE INDICATIONS:  Jeffrey [name] is a  50 y.o. male with a diagnosis of Osteomyelitis Right Great Toe who failed conservative measures and elected for surgical management.    The risks benefits and alternatives were discussed with the patient preoperatively including but not limited to the risks of infection, bleeding, nerve injury, cardiopulmonary complications, the need for revision surgery, among others, and the patient was willing to proceed.  OPERATIVE IMPLANTS: None  OPERATIVE FINDINGS: Calcified vessels  OPERATIVE PROCEDURE: Patient was brought to the operating room and underwent a general anesthetic. After adequate levels anesthesia were obtained patient's right lower extremity was prepped using DuraPrep draped into a sterile field. A timeout was called. A fishmouth incision was made just distal to the MTP joint. This was carried down through the MTP joint and the toe was amputated. Electrocautery was used for hemostasis. The wound was irrigated with normal saline. The incision was closed using 2-0 nylon. A sterile compressive dressing was applied. Patient was extubated taken to the PACU in stable condition.    Plan for discharge to home once patient is safe with ambulation without weightbearing. Anticipate discharge possibly on Sunday.

## 2016-04-01 NOTE — Progress Notes (Signed)
Orthopedic Tech Progress Note Patient Details:  Jeffrey [name] [birth year] [id number]  Ortho Devices Type of Ortho Device: Postop shoe/boot Ortho Device/Splint Location: RLE Ortho Device/Splint Interventions: Ordered, Application   Braulio Bosch 04/01/2016, 7:32 PM

## 2016-04-01 NOTE — Progress Notes (Signed)
PROGRESS NOTE                                                                                                                                                                                                             Patient Demographics:    Jeffrey [name], is a 50 y.o. male, DOB - [birth year], [id number]  Admit date - 03/30/2016   Admitting Physician Albertine Patricia, MD  Outpatient Primary MD for the patient is Arnoldo Morale, MD  LOS - 2  Outpatient Specialists:None  Chief Complaint  Patient presents with  . Wound Check  . Hyperglycemia       Brief Narrative   50 year old male with history of uncontrolled diabetes mellitus (A1c 13.7) with neuropathy, hypertension, medication noncompliance with hospitalization one month back for right great toe infection with cellulitis, was treated with antibiotic and discharged home. However the infection seemed worse. Patient seen by PCP and sent to ED with concern for osteomyelitis.   Subjective:    patient denies any pain in his leg. Anxious about his surgery.   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 Seen by orthopedics Dr. Sharol Given and given recurrent draining infection with failed IV antibiotics scheduled for amputation of the right great toe today. continue empiric antibiotics.   Active Problems: Uncontrolled type 2 diabetes mellitus with diabetic neuropathy and diabetic ulcer A1c >13. Increased Lantus dose with sliding scale coverage. Add premeal coverage post op once diet resumed. Concern for medication nonadherence. Girlfriend informs that patient's CBG ranges in 300s-400s. Emphasized on diet and medication adherence for tight blood glucose control. Continue Neurontin.   Hypokalemia Replenished.  Iron deficiency anemia Add iron supplements. Check stool for occult  blood.      Code Status : Full code  Family Communication  : girlfriend at bedside  Disposition Plan  : Home possibly 48 hours after surgery  Barriers For Discharge :  Right great toe amputation on 4/21  Consults  : Dr. Sharol Given  Procedures  : Right fifth toe amputation on 4/21  DVT Prophylaxis  :  Lovenox -   Lab Results  Component Value Date   PLT 261 03/31/2016    Antibiotics  :  IV vancomycin and Zosyn since 4/20  Anti-infectives    Start  Dose/Rate Route Frequency Ordered Stop   04/01/16 1500  ceFAZolin (ANCEF) IVPB 2g/100 mL premix     2 g 200 mL/hr over 30 Minutes Intravenous To [facility]Stay [facility] 03/31/16 1353 04/02/16 1500   03/31/16 0530  vancomycin (VANCOCIN) IVPB 1000 mg/200 mL premix     1,000 mg 200 mL/hr over 60 Minutes Intravenous Every 8 hours 03/30/16 2031     03/30/16 2030  piperacillin-tazobactam (ZOSYN) IVPB 3.375 g     3.375 g 12.5 mL/hr over 240 Minutes Intravenous Every 8 hours 03/30/16 1652     03/30/16 1915  vancomycin (VANCOCIN) 1,500 mg in sodium chloride 0.9 % 500 mL IVPB     1,500 mg 250 mL/hr over 120 Minutes Intravenous  Once 03/30/16 1911 03/30/16 2128   03/30/16 1630  vancomycin (VANCOCIN) 1,500 mg in sodium chloride 0.9 % 500 mL IVPB     1,500 mg 250 mL/hr over 120 Minutes Intravenous  Once 03/30/16 1624     03/30/16 1400  piperacillin-tazobactam (ZOSYN) IVPB 3.375 g     3.375 g 100 mL/hr over 30 Minutes Intravenous  Once 03/30/16 1356 03/30/16 1535        Objective:   Filed Vitals:   03/31/16 0954 03/31/16 1202 03/31/16 2137 04/01/16 0537  BP: 130/98 129/80 138/82 132/81  Pulse: 88 87 79 81  Temp: 98.3 F (36.8 C) 98.2 F (36.8 C) 99 F (37.2 C) 99 F (37.2 C)  TempSrc: Oral Oral Oral Oral  Resp: 16 18 18 18   SpO2: 97% 99% 100% 100%    Wt Readings from Last 3 Encounters:  03/30/16 85.458 kg (188 lb 6.4 oz)  02/24/16 85.73 kg (189 lb)  02/18/16 83.462 kg (184 lb)     Intake/Output Summary (Last 24 hours) at  04/01/16 1107 Last data filed at 04/01/16 0900  Gross per 24 hour  Intake 1518.25 ml  Output   2225 ml  Net -706.75 ml     Physical Exam  Gen: not in distress HEENT: no pallor, moist mucosa, supple neck Chest: clear b/l, no added sounds CVS: N S1&S2, no murmurs, rubs or gallop GI: soft, NT, ND, BS+ Musculoskeletal: warm, no edema, right great toe cellulitis with ulceration CNS: Alert and oriented, nonfocal    Data Review:    CBC  Recent Labs Lab 03/30/16 1110 03/30/16 1716 03/31/16 0240  WBC 7.4 6.5 4.8  HGB 12.4* 11.3* 10.8*  HCT 38.8* 36.6* 35.3*  PLT 269 286 261  MCV 75.6* 75.8* 75.6*  MCH 24.2* 23.4* 23.1*  MCHC 32.0 30.9 30.6  RDW 14.8 14.8 14.9    Chemistries   Recent Labs Lab 03/30/16 1110 03/30/16 1716 03/31/16 0240  NA 134*  --  137  K 3.8  --  3.2*  CL 102  --  103  CO2 23  --  23  GLUCOSE 300*  --  225*  BUN 9  --  7  CREATININE 0.79 0.84 0.73  CALCIUM 9.6  --  8.6*   ------------------------------------------------------------------------------------------------------------------ No results for input(s): CHOL, HDL, LDLCALC, TRIG, CHOLHDL, LDLDIRECT in the last 72 hours.  Lab Results  Component Value Date   HGBA1C 13.7 02/24/2016   ------------------------------------------------------------------------------------------------------------------ No results for input(s): TSH, T4TOTAL, T3FREE, THYROIDAB in the last 72 hours.  Invalid input(s): FREET3 ------------------------------------------------------------------------------------------------------------------  Recent Labs  03/31/16 1428  FERRITIN 67  TIBC 259  IRON 17*    Coagulation profile No results for input(s): INR, PROTIME in the last 168 hours.  No results for input(s): DDIMER in the  last 72 hours.  Cardiac Enzymes No results for input(s): CKMB, TROPONINI, MYOGLOBIN in the last 168 hours.  Invalid input(s):  CK ------------------------------------------------------------------------------------------------------------------    Component Value Date/Time   BNP 10.0 12/05/2014 0013    Inpatient Medications  Scheduled Meds: .  ceFAZolin (ANCEF) IV  2 g Intravenous To SS-Surg  . chlorhexidine  60 mL Topical Once  . Chlorhexidine Gluconate Cloth  6 each Topical Daily  . enoxaparin (LOVENOX) injection  40 mg Subcutaneous Q24H  . ferrous sulfate  325 mg Oral BID WC  . gabapentin  400 mg Oral TID  . insulin aspart  0-20 Units Subcutaneous TID WC  . insulin glargine  50 Units Subcutaneous QHS  . lisinopril  10 mg Oral Daily  . mupirocin ointment  1 application Nasal BID  . piperacillin-tazobactam (ZOSYN)  IV  3.375 g Intravenous Q8H  . vancomycin  1,500 mg Intravenous Once  . vancomycin  1,000 mg Intravenous Q8H   Continuous Infusions: . sodium chloride 75 mL/hr at 04/01/16 0451   PRN Meds:.acetaminophen **OR** acetaminophen, HYDROcodone-acetaminophen, lactulose, ondansetron **OR** ondansetron (ZOFRAN) IV, senna-docusate  Micro Results Recent Results (from the past 240 hour(s))  Surgical pcr screen     Status: Abnormal   Collection Time: 03/31/16  6:00 PM  Result Value Ref Range Status   MRSA, PCR NEGATIVE NEGATIVE Final   Staphylococcus aureus POSITIVE (A) NEGATIVE Final    Comment:        The Xpert SA Assay (FDA approved for NASAL specimens in patients over 81 years of age), is one component of a comprehensive surveillance program.  Test performance has been validated by [facility] for patients greater than or equal to 107 year old. It is not intended to diagnose infection nor to guide or monitor treatment.     Radiology Reports Dg Foot Complete Right  03/30/2016  CLINICAL DATA:  50 year old male with diabetic wound right big toe for 1 month. Pain spreading into the proximal foot. Subsequent encounter. EXAM: RIGHT FOOT COMPLETE - 3+ VIEW COMPARISON:  Right forefoot MRI  02/19/2016. Right foot radiographs 02/18/2016. FINDINGS: Dressing material about the right great toe. The right first phalanges appear intact with no cortical osteolysis identified. Questionable mild narrowing of the right first IP joint since March. The metatarsals and other phalanges also appear stable and intact. Calcified peripheral vascular disease again noted. Worsening soft tissue swelling about the distal foot. No subcutaneous gas. Hindfoot appears stable and intact. IMPRESSION: Worsening soft tissue swelling in the distal right foot but no plain radiographic features of osteomyelitis at this time. No subcutaneous gas. Electronically Signed   By: Genevie Ann M.D.   On: 03/30/2016 11:48    Time Spent in minutes  25   Louellen Molder M.D on 04/01/2016 at 11:07 AM  Between 7am to 7pm - Pager - [phone]  After 7pm go to [email or web address] - password [facility]  [facility] -  Office  [phone]

## 2016-04-01 NOTE — Anesthesia Postprocedure Evaluation (Signed)
Anesthesia Post Note  Patient: DUBOIS EICHMANN  Procedure(s) Performed: Procedure(s) (LRB): Right Great Toe Amputation (Right)  Patient location during evaluation: PACU Anesthesia Type: General Level of consciousness: awake and alert and oriented Pain management: pain level controlled Vital Signs Assessment: post-procedure vital signs reviewed and stable Respiratory status: spontaneous breathing, nonlabored ventilation and respiratory function stable Cardiovascular status: blood pressure returned to baseline and stable Postop Assessment: no signs of nausea or vomiting Anesthetic complications: no    Last Vitals:  Filed Vitals:   04/01/16 1711 04/01/16 1722  BP:  133/88  Pulse:  82  Temp: 36.9 C   Resp:  18    Last Pain:  Filed Vitals:   04/01/16 1724  PainSc: 4                  Linnie Delgrande A.

## 2016-04-01 NOTE — Anesthesia Preprocedure Evaluation (Addendum)
Anesthesia Evaluation  Patient identified by MRN, date of birth, ID band Patient awake    Reviewed: Allergy & Precautions, NPO status , [name] Chart, lab work & pertinent test results  History of Anesthesia Complications Negative for: history of anesthetic complications  Airway Mallampati: III  TM Distance: >3 FB Neck ROM: Full    Dental  (+) Teeth Intact, Missing, Chipped,    Pulmonary neg pulmonary ROS,    breath sounds clear to auscultation       Cardiovascular hypertension, Pt. on medications  Rhythm:Regular     Neuro/Psych negative neurological ROS  negative psych ROS   GI/Hepatic   Endo/Other  diabetes, Type 2  Renal/GU      Musculoskeletal   Abdominal   Peds  Hematology   Anesthesia Other Findings   Reproductive/Obstetrics                            Anesthesia Physical Anesthesia Plan  ASA: II  Anesthesia Plan: General   Post-op Pain Management:    Induction: Intravenous  Airway Management Planned: LMA  Additional Equipment: None  Intra-op Plan:   Post-operative Plan: Extubation in OR  Informed Consent: I have reviewed the patients History and Physical, chart, labs and discussed the procedure including the risks, benefits and alternatives for the proposed anesthesia with the patient or authorized representative who has indicated his/her understanding and acceptance.   Dental advisory given  Plan Discussed with: CRNA and Surgeon  Anesthesia Plan Comments:         Anesthesia Quick Evaluation

## 2016-04-01 NOTE — Anesthesia Procedure Notes (Signed)
Procedure Name: LMA Insertion Date/Time: 04/01/2016 4:39 PM Performed by: Sampson Si E Pre-anesthesia Checklist: Patient identified, Emergency Drugs available, Suction available, Patient being monitored and Timeout performed Patient Re-evaluated:Patient Re-evaluated prior to inductionOxygen Delivery Method: Circle system utilized Preoxygenation: Pre-oxygenation with 100% oxygen Intubation Type: IV induction Ventilation: Mask ventilation without difficulty LMA: LMA inserted LMA Size: 5.0 Number of attempts: 1 Placement Confirmation: positive ETCO2 and breath sounds checked- equal and bilateral Tube secured with: Tape Dental Injury: Teeth and Oropharynx as per pre-operative assessment

## 2016-04-02 LAB — BASIC METABOLIC PANEL
Anion gap: 10 (ref 5–15)
BUN: 8 mg/dL (ref 6–20)
CHLORIDE: 106 mmol/L (ref 101–111)
CO2: 21 mmol/L — ABNORMAL LOW (ref 22–32)
CREATININE: 1.04 mg/dL (ref 0.61–1.24)
Calcium: 8.3 mg/dL — ABNORMAL LOW (ref 8.9–10.3)
GFR calc Af Amer: >60 mL/min (ref >60)
Glucose, Bld: 206 mg/dL — ABNORMAL HIGH (ref 65–99)
Potassium: 3.6 mmol/L (ref 3.5–5.1)
Sodium: 137 mmol/L (ref 135–145)

## 2016-04-02 LAB — CBC
HEMATOCRIT: 33.1 % — AB (ref 39.0–52.0)
HEMOGLOBIN: 9.7 g/dL — AB (ref 13.0–17.0)
MCH: 22.8 pg — ABNORMAL LOW (ref 26.0–34.0)
MCHC: 29.3 g/dL — AB (ref 30.0–36.0)
MCV: 77.9 fL — ABNORMAL LOW (ref 78.0–100.0)
Platelets: 261 10*3/uL (ref 150–400)
RBC: 4.25 MIL/uL (ref 4.22–5.81)
RDW: 15.4 % (ref 11.5–15.5)
WBC: 6 10*3/uL (ref 4.0–10.5)

## 2016-04-02 LAB — URINALYSIS, ROUTINE W REFLEX MICROSCOPIC
BILIRUBIN URINE: NEGATIVE
Glucose, UA: 250 mg/dL — AB
Hgb urine dipstick: NEGATIVE
KETONES UR: NEGATIVE mg/dL
Leukocytes, UA: NEGATIVE
NITRITE: NEGATIVE
Protein, ur: NEGATIVE mg/dL
Specific Gravity, Urine: 1.013 (ref 1.005–1.030)
pH: 5.5 (ref 5.0–8.0)

## 2016-04-02 LAB — GLUCOSE, CAPILLARY
GLUCOSE-CAPILLARY: 195 mg/dL — AB (ref 65–99)
Glucose-Capillary: 177 mg/dL — ABNORMAL HIGH (ref 65–99)
Glucose-Capillary: 139 mg/dL — ABNORMAL HIGH (ref 65–99)
Glucose-Capillary: 106 mg/dL — ABNORMAL HIGH (ref 65–99)

## 2016-04-02 MED ORDER — INSULIN ASPART 100 UNIT/ML ~~LOC~~ SOLN
4.0000 [IU] | Freq: Three times a day (TID) | SUBCUTANEOUS | Status: DC
  Administered 2016-04-02 – 2016-04-03 (×4): 4 [IU] via SUBCUTANEOUS

## 2016-04-02 MED ORDER — VANCOMYCIN HCL IN DEXTROSE 750-5 MG/150ML-% IV SOLN
750.0000 mg | Freq: Three times a day (TID) | INTRAVENOUS | Status: DC
  Administered 2016-04-02 – 2016-04-03 (×3): 750 mg via INTRAVENOUS
  Filled 2016-04-02 (×6): qty 150

## 2016-04-02 MED ORDER — INSULIN GLARGINE 100 UNIT/ML ~~LOC~~ SOLN
60.0000 [IU] | Freq: Every day | SUBCUTANEOUS | Status: DC
  Administered 2016-04-02: 60 [IU] via SUBCUTANEOUS
  Filled 2016-04-02 (×2): qty 0.6

## 2016-04-02 NOTE — Progress Notes (Signed)
PROGRESS NOTE                                                                                                                                                                                                             Patient Demographics:    Jeffrey [name], is a 50 y.o. male, DOB - 1966/05/28, [id number]  Admit date - 03/30/2016   Admitting Physician Albertine Patricia, MD  Outpatient Primary MD for the patient is Arnoldo Morale, MD  LOS - 3  Outpatient Specialists:None  Chief Complaint  Patient presents with  . Wound Check  . Hyperglycemia       Brief Narrative   50 year old male with history of uncontrolled diabetes mellitus (A1c 13.7) with neuropathy, hypertension, medication noncompliance with hospitalization one month back for right great toe infection with cellulitis, was treated with antibiotic and discharged home. However the infection seemed worse. Patient seen by PCP and sent to ED with concern for osteomyelitis. Admitted for amputation of rt great toe.   Subjective:   Tolerated surgery well. Was able to ambulate with PT today. Denies any pain   Assessment  & Plan :    Principle problem Diabetic foot ulcer of right great toe with  osteomyelitis Progressively worsened. given recurrent draining infection with failed IV antibiotics pt underwent  amputation of the right great toe on 4/21 continue empiric antibiotics for today.( d/c after today).   Active Problems: Uncontrolled type 2 diabetes mellitus with diabetic neuropathy and diabetic ulcer A1c >13. Increased Lantus dose to 60 units and add premeal aspart . Continue sliding scale coverage.  Concern for medication nonadherence. Girlfriend informs that patient's CBG ranges in 300s-400s. Emphasized again on diet and medication adherence for tight blood glucose control. Continue Neurontin.   Hypokalemia Replenished.  Iron deficiency,  postoperative blood loss anemia Added iron supplements. Check stool for occult blood.      Code Status : Full code  Family Communication  : girlfriend at bedside  Disposition Plan  : Home possibly tomorrow with HHPT  Barriers For Discharge :  S/o surgery day 1  Consults  : Dr. Sharol Given  Procedures  : Right fifth toe amputation on 4/21  DVT Prophylaxis  :  Lovenox -   Lab Results  Component Value Date   PLT 261 04/02/2016    Antibiotics  :  IV  vancomycin and Zosyn since 4/20  Anti-infectives    Start     Dose/Rate Route Frequency Ordered Stop   04/01/16 1500  ceFAZolin (ANCEF) IVPB 2g/100 mL premix     2 g 200 mL/hr over 30 Minutes Intravenous To [facility] 03/31/16 1353 04/01/16 1640   03/31/16 0530  vancomycin (VANCOCIN) IVPB 1000 mg/200 mL premix     1,000 mg 200 mL/hr over 60 Minutes Intravenous Every 8 hours 03/30/16 2031     03/30/16 2030  piperacillin-tazobactam (ZOSYN) IVPB 3.375 g     3.375 g 12.5 mL/hr over 240 Minutes Intravenous Every 8 hours 03/30/16 1652     03/30/16 1915  vancomycin (VANCOCIN) 1,500 mg in sodium chloride 0.9 % 500 mL IVPB     1,500 mg 250 mL/hr over 120 Minutes Intravenous  Once 03/30/16 1911 03/30/16 2128   03/30/16 1630  vancomycin (VANCOCIN) 1,500 mg in sodium chloride 0.9 % 500 mL IVPB     1,500 mg 250 mL/hr over 120 Minutes Intravenous  Once 03/30/16 1624     03/30/16 1400  piperacillin-tazobactam (ZOSYN) IVPB 3.375 g     3.375 g 100 mL/hr over 30 Minutes Intravenous  Once 03/30/16 1356 03/30/16 1535        Objective:   Filed Vitals:   04/01/16 1800 04/01/16 2135 04/02/16 0120 04/02/16 0433  BP: 141/88 155/92 139/84 110/95  Pulse: 78 83 81 81  Temp: 98.4 F (36.9 C) 98.1 F (36.7 C) 98.7 F (37.1 C) 98.8 F (37.1 C)  TempSrc: Oral Oral Oral Oral  Resp: 18 18 18 18   SpO2: 100% 100% 97% 97%    Wt Readings from Last 3 Encounters:  03/30/16 85.458 kg (188 lb 6.4 oz)  02/24/16 85.73 kg (189 lb)  02/18/16 83.462  kg (184 lb)     Intake/Output Summary (Last 24 hours) at 04/02/16 1027 Last data filed at 04/02/16 0857  Gross per 24 hour  Intake 4698.58 ml  Output   2495 ml  Net 2203.58 ml     Physical Exam  Gen: not in distress HEENT: moist mucosa, supple neck Chest: clear b/l, no added sounds CVS: N S1&S2, no murmurs, rubs or gallop GI: soft, NT, ND, Musculoskeletal: dressing over  rt foot CNS: Alert and oriented, nonfocal    Data Review:    CBC  Recent Labs Lab 03/30/16 1110 03/30/16 1716 03/31/16 0240 04/02/16 0551  WBC 7.4 6.5 4.8 6.0  HGB 12.4* 11.3* 10.8* 9.7*  HCT 38.8* 36.6* 35.3* 33.1*  PLT 269 286 261 261  MCV 75.6* 75.8* 75.6* 77.9*  MCH 24.2* 23.4* 23.1* 22.8*  MCHC 32.0 30.9 30.6 29.3*  RDW 14.8 14.8 14.9 15.4    Chemistries   Recent Labs Lab 03/30/16 1110 03/30/16 1716 03/31/16 0240 04/02/16 0551  NA 134*  --  137 137  K 3.8  --  3.2* 3.6  CL 102  --  103 106  CO2 23  --  23 21*  GLUCOSE 300*  --  225* 206*  BUN 9  --  7 8  CREATININE 0.79 0.84 0.73 1.04  CALCIUM 9.6  --  8.6* 8.3*   ------------------------------------------------------------------------------------------------------------------ No results for input(s): CHOL, HDL, LDLCALC, TRIG, CHOLHDL, LDLDIRECT in the last 72 hours.  Lab Results  Component Value Date   HGBA1C 13.7 02/24/2016   ------------------------------------------------------------------------------------------------------------------ No results for input(s): TSH, T4TOTAL, T3FREE, THYROIDAB in the last 72 hours.  Invalid input(s): FREET3 ------------------------------------------------------------------------------------------------------------------  Recent Labs  03/31/16 1428  FERRITIN 67  TIBC  259  IRON 17*    Coagulation profile No results for input(s): INR, PROTIME in the last 168 hours.  No results for input(s): DDIMER in the last 72 hours.  Cardiac Enzymes No results for input(s): CKMB,  TROPONINI, MYOGLOBIN in the last 168 hours.  Invalid input(s): CK ------------------------------------------------------------------------------------------------------------------    Component Value Date/Time   BNP 10.0 12/05/2014 0013    Inpatient Medications  Scheduled Meds: . Chlorhexidine Gluconate Cloth  6 each Topical Daily  . enoxaparin (LOVENOX) injection  40 mg Subcutaneous Q24H  . ferrous sulfate  325 mg Oral BID WC  . gabapentin  400 mg Oral TID  . insulin aspart  0-20 Units Subcutaneous TID WC  . insulin glargine  50 Units Subcutaneous QHS  . lisinopril  10 mg Oral Daily  . mupirocin ointment  1 application Nasal BID  . piperacillin-tazobactam (ZOSYN)  IV  3.375 g Intravenous Q8H  . vancomycin  1,500 mg Intravenous Once  . vancomycin  1,000 mg Intravenous Q8H   Continuous Infusions: . sodium chloride 75 mL/hr at 04/01/16 2202  . sodium chloride 10 mL/hr at 04/01/16 1851  . lactated ringers 10 mL/hr at 04/01/16 1612   PRN Meds:.acetaminophen **OR** acetaminophen, HYDROcodone-acetaminophen, HYDROmorphone (DILAUDID) injection, lactulose, methocarbamol **OR** methocarbamol (ROBAXIN)  IV, metoCLOPramide **OR** metoCLOPramide (REGLAN) injection, ondansetron **OR** ondansetron (ZOFRAN) IV, oxyCODONE, senna-docusate  Micro Results Recent Results (from the past 240 hour(s))  Surgical pcr screen     Status: Abnormal   Collection Time: 03/31/16  6:00 PM  Result Value Ref Range Status   MRSA, PCR NEGATIVE NEGATIVE Final   Staphylococcus aureus POSITIVE (A) NEGATIVE Final    Comment:        The Xpert SA Assay (FDA approved for NASAL specimens in patients over 65 years of age), is one component of a comprehensive surveillance program.  Test performance has been validated by [facility] for patients greater than or equal to 45 year old. It is not intended to diagnose infection nor to guide or monitor treatment.     Radiology Reports Dg Foot Complete  Right  03/30/2016  CLINICAL DATA:  50 year old male with diabetic wound right big toe for 1 month. Pain spreading into the proximal foot. Subsequent encounter. EXAM: RIGHT FOOT COMPLETE - 3+ VIEW COMPARISON:  Right forefoot MRI 02/19/2016. Right foot radiographs 02/18/2016. FINDINGS: Dressing material about the right great toe. The right first phalanges appear intact with no cortical osteolysis identified. Questionable mild narrowing of the right first IP joint since March. The metatarsals and other phalanges also appear stable and intact. Calcified peripheral vascular disease again noted. Worsening soft tissue swelling about the distal foot. No subcutaneous gas. Hindfoot appears stable and intact. IMPRESSION: Worsening soft tissue swelling in the distal right foot but no plain radiographic features of osteomyelitis at this time. No subcutaneous gas. Electronically Signed   By: Genevie Ann M.D.   On: 03/30/2016 11:48    Time Spent in minutes  25   Louellen Molder M.D on 04/02/2016 at 10:27 AM  Between 7am to 7pm - Pager - [phone]  After 7pm go to [email or web address] - password [facility]  [facility] -  Office  [phone]

## 2016-04-02 NOTE — Progress Notes (Signed)
Patient stable resting Dressing dry Plan mobilization today possible discharge if he is okay on the walker if not today then he should be able to be discharged tomorrow

## 2016-04-02 NOTE — Evaluation (Signed)
Physical Therapy Evaluation Patient Details Name: Jeffrey [name] MRN: [id number] DOB: [birth year] Today's Date: 04/02/2016   History of Present Illness  Pt is a 50 y/o male who presents with osteomyelitis of the R great toe. Pt is now s/p R great toe amputation and is currently NWB.   Clinical Impression  Pt admitted with above diagnosis. Pt currently with functional limitations due to the deficits listed below (see PT Problem List). At the time of PT eval pt was able to perform transfers and ambulation with min guard to min assist for balance support and safety. Pt was able to maintain NWB status well with cueing. Pt attempted ambulation with both the RW and the knee scooter, and pt states he feels more comfortable with the walker. Per wife, they have a w/c available to them as well if needed at home to manage longer distances. Pt reports he does not feel ready to return home today and would like to work with therapy once more tomorrow before d/c. Pt will benefit from skilled PT to increase their independence and safety with mobility to allow discharge to the venue listed below.       Follow Up Recommendations Home health PT;Supervision for mobility/OOB    Equipment Recommendations  None recommended by PT    Recommendations for Other Services       Precautions / Restrictions Precautions Precautions: Fall Restrictions Weight Bearing Restrictions: Yes RLE Weight Bearing: Non weight bearing      Mobility  Bed Mobility Overal bed mobility: Needs Assistance Bed Mobility: Supine to Sit     Supine to sit: Min guard     General bed mobility comments: Use of rails for support. Pt required hands-on guarding to elevate trunk and maintain upright posture.   Transfers Overall transfer level: Needs assistance Equipment used: Rolling walker (2 wheeled) Transfers: Sit to/from Stand Sit to Stand: Min assist         General transfer comment: Assist to power-up to full standing  position. Pt was cued for NWB status and able to maintain with continued cues.   Ambulation/Gait Ambulation/Gait assistance: Min guard Ambulation Distance (Feet): 35 Feet Assistive device: Rolling walker (2 wheeled) (Knee scooter) Gait Pattern/deviations: Step-to pattern;Decreased stride length Gait velocity: Decreased Gait velocity interpretation: Below normal speed for age/gender General Gait Details: Pt initially with RW bed>recliner. Wife asking about using a knee scooter as they have access to one at home. Pt agreed that he would like to try. Knee scooter brought in and pt was able to mobilize out to the hallway and back to the recliner.   Stairs            Wheelchair Mobility    Modified Rankin (Stroke Patients Only)       Balance Overall balance assessment: Needs assistance Sitting-balance support: Feet supported;No upper extremity supported Sitting balance-Leahy Scale: Fair     Standing balance support: Bilateral upper extremity supported;During functional activity Standing balance-Leahy Scale: Poor Standing balance comment: Requires UE support to maintain standing balance with NWB on R                             Pertinent Vitals/Pain Pain Assessment: 0-10 Pain Score: 7  Pain Location: Op site Pain Descriptors / Indicators: Operative site guarding;Sore Pain Intervention(s): Limited activity within patient's tolerance;Monitored during session;Repositioned    Home Living Family/patient expects to be discharged to:: Private residence Living Arrangements: Spouse/significant other Available Help at Discharge: Family;Available  24 hours/day Type of Home: Apartment Home Access: Level entry     Home Layout: One level Home Equipment: Grab bars - toilet;Grab bars - tub/shower;Cane - single point;Walker - 2 wheels;Crutches (Access to a shower chair, w/c, knee scooter)      Prior Function Level of Independence: Independent               Hand  Dominance        Extremity/Trunk Assessment   Upper Extremity Assessment: Overall WFL for tasks assessed           Lower Extremity Assessment: Generalized weakness;RLE deficits/detail RLE Deficits / Details: Acute pain and decreased AROM consistent with above mentioned procedure.     Cervical / Trunk Assessment: Normal (Forward head posture)  Communication   Communication: No difficulties  Cognition Arousal/Alertness: Lethargic Behavior During Therapy: WFL for tasks assessed/performed Overall Cognitive Status: Within Functional Limits for tasks assessed                      General Comments      Exercises        Assessment/Plan    PT Assessment Patient needs continued PT services  PT Diagnosis Difficulty walking;Acute pain   PT Problem List Decreased strength;Decreased range of motion;Decreased activity tolerance;Decreased balance;Decreased mobility;Decreased knowledge of use of DME;Decreased safety awareness;Decreased knowledge of precautions;Pain  PT Treatment Interventions DME instruction;Gait training;Functional mobility training;Therapeutic activities;Therapeutic exercise;Neuromuscular re-education;Patient/family education   PT Goals (Current goals can be found in the Care Plan section) Acute Rehab PT Goals Patient Stated Goal: Home tomorrow PT Goal Formulation: With patient/family Time For Goal Achievement: 04/09/16 Potential to Achieve Goals: Good    Frequency Min 3X/week   Barriers to discharge        Co-evaluation               End of Session Equipment Utilized During Treatment: Gait belt Activity Tolerance: Patient limited by fatigue Patient left: in chair;with call bell/phone within reach;with family/visitor present Nurse Communication: Mobility status         Time: [id number] PT Time Calculation (min) (ACUTE ONLY): 31 min   Charges:   PT Evaluation $PT Eval Moderate Complexity: 1 Procedure PT Treatments $Gait Training:  8-22 mins   PT G Codes:        Rolinda Roan Apr 12, 2016, 9:05 AM   Rolinda Roan, PT, DPT Acute Rehabilitation Services Pager: [phone]

## 2016-04-03 DIAGNOSIS — D509 Iron deficiency anemia, unspecified: Secondary | ICD-10-CM | POA: Insufficient documentation

## 2016-04-03 DIAGNOSIS — L03115 Cellulitis of right lower limb: Secondary | ICD-10-CM

## 2016-04-03 LAB — CBC
HCT: 32.4 % — ABNORMAL LOW (ref 39.0–52.0)
HEMOGLOBIN: 9.8 g/dL — AB (ref 13.0–17.0)
MCH: 23.2 pg — AB (ref 26.0–34.0)
MCHC: 30.2 g/dL (ref 30.0–36.0)
MCV: 76.8 fL — ABNORMAL LOW (ref 78.0–100.0)
PLATELETS: 268 10*3/uL (ref 150–400)
RBC: 4.22 MIL/uL (ref 4.22–5.81)
RDW: 15.3 % (ref 11.5–15.5)
WBC: 6 10*3/uL (ref 4.0–10.5)

## 2016-04-03 LAB — GLUCOSE, CAPILLARY
Glucose-Capillary: 166 mg/dL — ABNORMAL HIGH (ref 65–99)
Glucose-Capillary: 135 mg/dL — ABNORMAL HIGH (ref 65–99)

## 2016-04-03 MED ORDER — INSULIN ASPART 100 UNIT/ML ~~LOC~~ SOLN: 0.0000 [IU] | Freq: Three times a day (TID) | SUBCUTANEOUS | Status: DC

## 2016-04-03 MED ORDER — FERROUS SULFATE 325 (65 FE) MG PO TABS: 325.0000 mg | ORAL_TABLET | Freq: Two times a day (BID) | ORAL | Status: DC

## 2016-04-03 MED ORDER — INSULIN GLARGINE 100 UNIT/ML SOLOSTAR PEN: 60.0000 [IU] | PEN_INJECTOR | Freq: Every day | SUBCUTANEOUS | Status: DC

## 2016-04-03 MED ORDER — HYDROCODONE-ACETAMINOPHEN 5-325 MG PO TABS: 2.0000 | ORAL_TABLET | Freq: Four times a day (QID) | ORAL | Status: DC | PRN

## 2016-04-03 NOTE — Care Management Note (Signed)
Case Management Note  Patient Details  Name: Jeffrey [name] MRN: [id number] Date of Birth: July 03, 1966  Subjective/Objective:                  osteomyelitis of the R great toe  Action/Plan: CM spoke with patient's fiance. Patient is asleep. She states he has all he needs for discharge home. PT did not recommended DME or PT at home.   Expected Discharge Date:   04/03/16               Expected Discharge Plan:  Home/Self Care  In-House Referral:     Discharge planning Services  CM Consult  Post Acute Care Choice:    Choice offered to:     DME Arranged:  N/A DME Agency:  NA  HH Arranged:  NA HH Agency:  NA  Status of Service:  Completed, signed off  Medicare Important Message Given:    Date Medicare IM Given:    Medicare IM give by:    Date Additional Medicare IM Given:    Additional Medicare Important Message give by:     If discussed at [place] of Stay Meetings, dates discussed:    Additional Comments:  Apolonio Schneiders, RN 04/03/2016, 11:48 AM

## 2016-04-03 NOTE — Discharge Instructions (Signed)
Diabetes Mellitus and Food It is important for you to manage your blood sugar (glucose) level. Your blood glucose level can be greatly affected by what you eat. Eating healthier foods in the appropriate amounts throughout the day at about the same time each day will help you control your blood glucose level. It can also help slow or prevent worsening of your diabetes mellitus. Healthy eating may even help you improve the level of your blood pressure and reach or maintain a healthy weight.  General recommendations for healthful eating and cooking habits include:  Eating meals and snacks regularly. Avoid going long periods of time without eating to lose weight.  Eating a diet that consists mainly of plant-based foods, such as fruits, vegetables, nuts, legumes, and whole grains.  Using low-heat cooking methods, such as baking, instead of high-heat cooking methods, such as deep frying. Work with your dietitian to make sure you understand how to use the Nutrition Facts information on food labels. HOW CAN FOOD AFFECT ME? Carbohydrates Carbohydrates affect your blood glucose level more than any other type of food. Your dietitian will help you determine how many carbohydrates to eat at each meal and teach you how to count carbohydrates. Counting carbohydrates is important to keep your blood glucose at a healthy level, especially if you are using insulin or taking certain medicines for diabetes mellitus. Alcohol Alcohol can cause sudden decreases in blood glucose (hypoglycemia), especially if you use insulin or take certain medicines for diabetes mellitus. Hypoglycemia can be a life-threatening condition. Symptoms of hypoglycemia (sleepiness, dizziness, and disorientation) are similar to symptoms of having too much alcohol.  If your health care provider has given you approval to drink alcohol, do so in moderation and use the following guidelines:  Women should not have more than one drink per day, and men  should not have more than two drinks per day. One drink is equal to:  12 oz of beer.  5 oz of wine.  1 oz of hard liquor.  Do not drink on an empty stomach.  Keep yourself hydrated. Have water, diet soda, or unsweetened iced tea.  Regular soda, juice, and other mixers might contain a lot of carbohydrates and should be counted. WHAT FOODS ARE NOT RECOMMENDED? As you make food choices, it is important to remember that all foods are not the same. Some foods have fewer nutrients per serving than other foods, even though they might have the same number of calories or carbohydrates. It is difficult to get your body what it needs when you eat foods with fewer nutrients. Examples of foods that you should avoid that are high in calories and carbohydrates but low in nutrients include:  Trans fats (most processed foods list trans fats on the Nutrition Facts label).  Regular soda.  Juice.  Candy.  Sweets, such as cake, pie, doughnuts, and cookies.  Fried foods. WHAT FOODS CAN I EAT? Eat nutrient-rich foods, which will nourish your body and keep you healthy. The food you should eat also will depend on several factors, including:  The calories you need.  The medicines you take.  Your weight.  Your blood glucose level.  Your blood pressure level.  Your cholesterol level. You should eat a variety of foods, including:  Protein.  Lean cuts of meat.  Proteins low in saturated fats, such as fish, egg whites, and beans. Avoid processed meats.  Fruits and vegetables.  Fruits and vegetables that may help control blood glucose levels, such as apples, mangoes, and   yams.  Dairy products.  Choose fat-free or low-fat dairy products, such as milk, yogurt, and cheese.  Grains, bread, pasta, and rice.  Choose whole grain products, such as multigrain bread, whole oats, and brown rice. These foods may help control blood pressure.  Fats.  Foods containing healthful fats, such as nuts,  avocado, olive oil, canola oil, and fish. DOES EVERYONE WITH DIABETES MELLITUS HAVE THE SAME MEAL PLAN? Because every person with diabetes mellitus is different, there is not one meal plan that works for everyone. It is very important that you meet with a dietitian who will help you create a meal plan that is just right for you.   This information is not intended to replace advice given to you by your health care provider. Make sure you discuss any questions you have with your health care provider.   Document Released: 08/25/2005 Document Revised: 12/19/2014 Document Reviewed: 10/25/2013 Elsevier Interactive Patient Education 2016 [employer]  

## 2016-04-03 NOTE — Discharge Summary (Signed)
Physician Discharge Summary  Jeffrey [name] [id number] DOB: 1966-04-01 DOA: 03/30/2016  PCP: Arnoldo Morale, MD  Admit date: 03/30/2016 Discharge date: 04/03/2016  Time spent: 35 minutes  Recommendations for Outpatient Follow-up:  1. Discharge home with outpatient follow-up with Dr. Lillie Fragmin in one week   Discharge Diagnoses:  Principal Problem:   Ulcer of right great toe due to diabetes mellitus ([place])   Active Problems:   HTN (hypertension)   Diabetic neuropathy ([place])   DM (diabetes mellitus) ([place])   Cellulitis of right foot   Diabetic foot infection ([place])   Uncontrolled type 2 diabetes mellitus with hyperglycemia, with long-term current use of insulin ([place])   iron deficiency anemia   Hypokalemia   Discharge Condition: Fair  Diet recommendation: Diabetic  CODE STATUS: Full code   There were no vitals filed for this visit.  History of present illness:  Please refer to admission H&P for details, in brief, 50 year old male with history of uncontrolled diabetes mellitus (A1c 13.7) with neuropathy, hypertension, medication noncompliance with hospitalization one month back for right great toe infection with cellulitis, was treated with antibiotic and discharged home. However the infection seemed worse. Patient seen by PCP and sent to ED with concern for osteomyelitis. Admitted for amputation of rt great toe.  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 given recurrent draining infection with failed IV antibiotics pt underwent amputation of the right great toe on 4/21 Discontinued antibiotics after 4/22. Hemodynamically stable. Able to ambulate without much difficulty. Stable for discharge. He should follow-up with Dr. Sharol Given in 3-4 days. I have prescribed some pain medications.  Active Problems: Uncontrolled type 2 diabetes mellitus with diabetic neuropathy and diabetic ulcer A1c >13. Increased Lantus dose to 60  units and adjusted his sliding scale coverage for tighter blood glucose control. Counseled on medication and diet adherence. Girlfriend informs that patient's CBG ranges in 300s-400s. Instructed to keep a log of his CBG monitoring ensured to his PCP. Continue Neurontin.   Hypokalemia Replenished.  Iron deficiency, postoperative blood loss anemia Added iron supplements.    Discharge Exam: Filed Vitals:   04/02/16 2149 04/03/16 0615  BP: 157/90 150/85  Pulse: 86 86  Temp: 99.5 F (37.5 C) 98.2 F (36.8 C)  Resp: 18 17   Gen: not in distress HEENT: moist mucosa, supple neck Chest: clear b/l, no added sounds CVS: N S1&S2, no murmurs, rubs or gallop GI: soft, NT, ND, Musculoskeletal: dressing over rt foot CNS: Alert and oriented, nonfocal  Discharge Instructions    Current Discharge Medication List    START taking these medications   Details  ferrous sulfate 325 (65 FE) MG tablet Take 1 tablet (325 mg total) by mouth 2 (two) times daily with a meal. Qty: 60 tablet, Refills: 3      CONTINUE these medications which have CHANGED   Details  HYDROcodone-acetaminophen (NORCO/VICODIN) 5-325 MG tablet Take 2 tablets by mouth every 6 (six) hours as needed for moderate pain or severe pain. Qty: 20 tablet, Refills: 0    insulin aspart (NOVOLOG) 100 UNIT/ML injection Inject 0-15 Units into the skin 3 (three) times daily with meals. Sliding scale  CBG 70 - 120: 0 units: CBG 121 - 140: 2 units; CBG 140 - 200: 4 units; CBG 201 - 240: 6 units; CBG 240 - 300: 8 units;CBG 300 - 350: 12 units; CBG 351 - 400: 16 units; CBG > 400 : 16 units and notify your  MD Qty: 10  mL, Refills: 3   Associated Diagnoses: Uncontrolled type 2 diabetes mellitus with hyperglycemia, with long-term current use of insulin (HCC)    Insulin Glargine (LANTUS SOLOSTAR) 100 UNIT/ML Solostar Pen Inject 60 Units into the skin daily at 10 pm. Qty: 5 pen, Refills: 3   Associated Diagnoses: Uncontrolled type 2 diabetes  mellitus with hyperglycemia, with long-term current use of insulin ([place])      CONTINUE these medications which have NOT CHANGED   Details  gabapentin (NEURONTIN) 400 MG capsule Take 1 capsule (400 mg total) by mouth 3 (three) times daily. Qty: 90 capsule, Refills: 3   Associated Diagnoses: Diabetic polyneuropathy associated with type 2 diabetes mellitus (HCC)    lactulose (CHRONULAC) 10 GM/15ML solution Take 15 mLs (10 g total) by mouth 2 (two) times daily as needed for mild constipation. Qty: 946 mL, Refills: 1   Associated Diagnoses: Other constipation    lisinopril (PRINIVIL,ZESTRIL) 10 MG tablet Take 1 tablet (10 mg total) by mouth daily. Qty: 30 tablet, Refills: 3   Associated Diagnoses: Essential hypertension    Insulin Syringe-Needle U-100 (INSULIN SYRINGE 1CC/31GX5/16") 31G X 5/16" 1 ML MISC Check blood sugar TID & QHS Qty: 100 each, Refills: 2    mupirocin cream (BACTROBAN) 2 % Apply topically daily. Apply Bactroban to right great toe once a day and cover with foam dressing.  (Change foam dressing every 5 days or sooner if needed if dirty.) Qty: 15 g, Refills: 0      STOP taking these medications     Insulin Pen Needle 31G X 5 MM MISC        No Known Allergies Follow-up Information    Follow up with DUDA,MARCUS V, MD In 1 week.   Specialty:  Orthopedic Surgery   Contact information:   [place] [address] [phone]       Follow up with Arnoldo Morale, MD. Schedule an appointment as soon as possible for a visit in 1 week.   Specialty:  Family Medicine   Contact information:   [place]  [address] [phone]        The results of significant diagnostics from this hospitalization (including imaging, microbiology, ancillary and laboratory) are listed below for reference.    Significant Diagnostic Studies: Dg Foot Complete Right  04-07-2016  CLINICAL DATA:  50 year old male with diabetic wound right big toe for 1  month. Pain spreading into the proximal foot. Subsequent encounter. EXAM: RIGHT FOOT COMPLETE - 3+ VIEW COMPARISON:  Right forefoot MRI 02/19/2016. Right foot radiographs 02/18/2016. FINDINGS: Dressing material about the right great toe. The right first phalanges appear intact with no cortical osteolysis identified. Questionable mild narrowing of the right first IP joint since March. The metatarsals and other phalanges also appear stable and intact. Calcified peripheral vascular disease again noted. Worsening soft tissue swelling about the distal foot. No subcutaneous gas. Hindfoot appears stable and intact. IMPRESSION: Worsening soft tissue swelling in the distal right foot but no plain radiographic features of osteomyelitis at this time. No subcutaneous gas. Electronically Signed   By: Genevie Ann M.D.   On: April 07, 2016 11:48    Microbiology: Recent Results (from the past 240 hour(s))  Surgical pcr screen     Status: Abnormal   Collection Time: 03/31/16  6:00 PM  Result Value Ref Range Status   MRSA, PCR NEGATIVE NEGATIVE Final   Staphylococcus aureus POSITIVE (A) NEGATIVE Final    Comment:        The  Xpert SA Assay (FDA approved for NASAL specimens in patients over 68 years of age), is one component of a comprehensive surveillance program.  Test performance has been validated by [facility] for patients greater than or equal to 46 year old. It is not intended to diagnose infection nor to guide or monitor treatment.      Labs: Basic Metabolic Panel:  Recent Labs Lab 03/30/16 1110 03/30/16 1716 03/31/16 0240 04/02/16 0551  NA 134*  --  137 137  K 3.8  --  3.2* 3.6  CL 102  --  103 106  CO2 23  --  23 21*  GLUCOSE 300*  --  225* 206*  BUN 9  --  7 8  CREATININE 0.79 0.84 0.73 1.04  CALCIUM 9.6  --  8.6* 8.3*   Liver Function Tests: No results for input(s): AST, ALT, ALKPHOS, BILITOT, PROT, ALBUMIN in the last 168 hours. No results for input(s): LIPASE, AMYLASE in the last 168  hours. No results for input(s): AMMONIA in the last 168 hours. CBC:  Recent Labs Lab 03/30/16 1110 03/30/16 1716 03/31/16 0240 04/02/16 0551 04/03/16 0249  WBC 7.4 6.5 4.8 6.0 6.0  HGB 12.4* 11.3* 10.8* 9.7* 9.8*  HCT 38.8* 36.6* 35.3* 33.1* 32.4*  MCV 75.6* 75.8* 75.6* 77.9* 76.8*  PLT 269 286 261 261 268   Cardiac Enzymes: No results for input(s): CKTOTAL, CKMB, CKMBINDEX, TROPONINI in the last 168 hours. BNP: BNP (last 3 results) No results for input(s): BNP in the last 8760 hours.  ProBNP (last 3 results) No results for input(s): PROBNP in the last 8760 hours.  CBG:  Recent Labs Lab 04/02/16 0715 04/02/16 1129 04/02/16 1754 04/02/16 2146 04/03/16 0911  GLUCAP 195* 177* 139* 106* 166*       Signed:  Louellen Molder MD.  [facility] 04/03/2016, 10:36 AM

## 2016-04-03 NOTE — Progress Notes (Signed)
Physical Therapy Treatment Patient Details Name: Jeffrey [name] MRN: [id number] DOB: 07-21-1966 Today's Date: 04/03/2016    History of Present Illness Pt is a 50 y/o male who presents with osteomyelitis of the R great toe. Pt is now s/p R great toe amputation and is currently NWB.     PT Comments    Pt demonstrates significant improvement in functional mobility, now able to perform all basic tasks unassisted, confident in ambulation, maintains NWB, and has plenty of help at d/c.  Recommend home without follow up therapy, pt instructed to continue NWB until MD upgrades, then consider seeking PT to address any lingering deficits as a result of recovery.  No further acute needs as goals met, noting d/c planned this pm. Thank you   Follow Up Recommendations  No PT follow up     Equipment Recommendations  None recommended by PT    Recommendations for Other Services       Precautions / Restrictions Precautions Precautions: Fall Precaution Comments: use RW for NWB status Restrictions Weight Bearing Restrictions: Yes RLE Weight Bearing: Non weight bearing Other Position/Activity Restrictions: elevate limb at rest    Mobility  Bed Mobility Overal bed mobility: Modified Independent Bed Mobility: Sit to Supine;Supine to Sit     Supine to sit: Supervision Sit to supine: Supervision   General bed mobility comments: improved moblity today using rail and up without physical assist  Transfers Overall transfer level: Modified independent Equipment used: Rolling walker (2 wheeled) Transfers: Sit to/from Stand Sit to Stand: Modified independent (Device/Increase time)         General transfer comment: pt up/down using device and maintains NWB throughout - off bed, off toilet  Ambulation/Gait Ambulation/Gait assistance: Modified independent (Device/Increase time) Ambulation Distance (Feet): 25 Feet Assistive device: Rolling walker (2 wheeled) Gait Pattern/deviations:  (NWB  RLE) Gait velocity: Decreased Gait velocity interpretation: Below normal speed for age/gender General Gait Details: no problems subjectively, feels confident with RW, to/from bathroom with supervison and NWB throughout   Stairs            Wheelchair Mobility    Modified Rankin (Stroke Patients Only)       Balance Overall balance assessment: Needs assistance Sitting-balance support: No upper extremity supported;Feet supported Sitting balance-Leahy Scale: Good     Standing balance support: During functional activity;Bilateral upper extremity supported Standing balance-Leahy Scale: Poor Standing balance comment: NWB, depends on RW for standing/mobility                    Cognition Arousal/Alertness: Awake/alert Behavior During Therapy: WFL for tasks assessed/performed Overall Cognitive Status: Within Functional Limits for tasks assessed                      Exercises      General Comments General comments (skin integrity, edema, etc.): dressing at foot dry, clean, intact      Pertinent Vitals/Pain Pain Assessment: No/denies pain Pain Score:  ("not bad")    Home Living                      Prior Function            PT Goals (current goals can now be found in the care plan section) Acute Rehab PT Goals Patient Stated Goal: d/c PT Goal Formulation: All assessment and education complete, DC therapy Progress towards PT goals: Goals met/education completed, patient discharged from PT    Frequency  PT Plan Discharge plan needs to be updated    Co-evaluation             End of Session   Activity Tolerance: Patient tolerated treatment well Patient left: in bed;with call bell/phone within reach;with family/visitor present     Time: 5449-2010 PT Time Calculation (min) (ACUTE ONLY): 14 min  Charges:  $Self Care/Home Management: 2023/08/13                    G Codes:      [name] 04/03/2016, 11:19 AM

## 2016-04-03 NOTE — Progress Notes (Signed)
Pt scheduled for discharge home this pm

## 2016-04-03 NOTE — Progress Notes (Signed)
Pt discharged home with family in stable condition. Discharge teaching included none weight bearing on right leg

## 2016-04-04 ENCOUNTER — Encounter (HOSPITAL_COMMUNITY): Payer: Self-pay | Admitting: Orthopedic Surgery

## 2016-04-13 ENCOUNTER — Encounter: Payer: Self-pay | Admitting: Family Medicine

## 2016-04-13 ENCOUNTER — Ambulatory Visit: Payer: Self-pay | Attending: Family Medicine | Admitting: Family Medicine

## 2016-04-13 VITALS — BP 158/83 | HR 64 | Temp 98.3°F | Resp 16 | Ht 67.0 in | Wt 197.8 lb

## 2016-04-13 DIAGNOSIS — I10 Essential (primary) hypertension: Secondary | ICD-10-CM

## 2016-04-13 DIAGNOSIS — S98139A Complete traumatic amputation of one unspecified lesser toe, initial encounter: Secondary | ICD-10-CM | POA: Insufficient documentation

## 2016-04-13 DIAGNOSIS — S98131A Complete traumatic amputation of one right lesser toe, initial encounter: Secondary | ICD-10-CM

## 2016-04-13 DIAGNOSIS — R51 Headache: Secondary | ICD-10-CM | POA: Insufficient documentation

## 2016-04-13 DIAGNOSIS — E1142 Type 2 diabetes mellitus with diabetic polyneuropathy: Secondary | ICD-10-CM

## 2016-04-13 DIAGNOSIS — L97519 Non-pressure chronic ulcer of other part of right foot with unspecified severity: Secondary | ICD-10-CM

## 2016-04-13 DIAGNOSIS — Z794 Long term (current) use of insulin: Secondary | ICD-10-CM | POA: Insufficient documentation

## 2016-04-13 DIAGNOSIS — E1165 Type 2 diabetes mellitus with hyperglycemia: Secondary | ICD-10-CM | POA: Insufficient documentation

## 2016-04-13 DIAGNOSIS — E11621 Type 2 diabetes mellitus with foot ulcer: Secondary | ICD-10-CM

## 2016-04-13 DIAGNOSIS — Z89411 Acquired absence of right great toe: Secondary | ICD-10-CM | POA: Insufficient documentation

## 2016-04-13 DIAGNOSIS — R519 Headache, unspecified: Secondary | ICD-10-CM

## 2016-04-13 DIAGNOSIS — Z79899 Other long term (current) drug therapy: Secondary | ICD-10-CM | POA: Insufficient documentation

## 2016-04-13 LAB — GLUCOSE, POCT (MANUAL RESULT ENTRY): POC Glucose: 79 mg/dl (ref 70–99)

## 2016-04-13 MED ORDER — CEPHALEXIN 500 MG PO CAPS: 500.0000 mg | ORAL_CAPSULE | Freq: Two times a day (BID) | ORAL | Status: DC

## 2016-04-13 MED ORDER — LISINOPRIL 20 MG PO TABS: 20.0000 mg | ORAL_TABLET | Freq: Every day | ORAL | Status: DC

## 2016-04-13 MED ORDER — BUTALBITAL-APAP-CAFFEINE 50-325-40 MG PO TABS: 1.0000 | ORAL_TABLET | Freq: Four times a day (QID) | ORAL | Status: DC | PRN

## 2016-04-13 MED FILL — ?LISINOPRIL 20 MG TABLET: 20 | 30 days supply | Qty: 30 | Fill #0

## 2016-04-13 MED FILL — CEPHALEXIN 500 MG CAPSULE: 500 | 10 days supply | Qty: 20 | Fill #0

## 2016-04-13 NOTE — Patient Instructions (Signed)
Hypertension Hypertension, commonly called high blood pressure, is when the force of blood pumping through your arteries is too strong. Your arteries are the blood vessels that carry blood from your heart throughout your body. A blood pressure reading consists of a higher number over a lower number, such as 110/72. The higher number (systolic) is the pressure inside your arteries when your heart pumps. The lower number (diastolic) is the pressure inside your arteries when your heart relaxes. Ideally you want your blood pressure below 120/80. Hypertension forces your heart to work harder to pump blood. Your arteries may become narrow or stiff. Having untreated or uncontrolled hypertension can cause heart attack, stroke, kidney disease, and other problems. RISK FACTORS Some risk factors for high blood pressure are controllable. Others are not.  Risk factors you cannot control include:   Race. You may be at higher risk if you are African American.  Age. Risk increases with age.  Gender. Men are at higher risk than women before age 45 years. After age 65, women are at higher risk than men. Risk factors you can control include:  Not getting enough exercise or physical activity.  Being overweight.  Getting too much fat, sugar, calories, or salt in your diet.  Drinking too much alcohol. SIGNS AND SYMPTOMS Hypertension does not usually cause signs or symptoms. Extremely high blood pressure (hypertensive crisis) may cause headache, anxiety, shortness of breath, and nosebleed. DIAGNOSIS To check if you have hypertension, your health care provider will measure your blood pressure while you are seated, with your arm held at the level of your heart. It should be measured at least twice using the same arm. Certain conditions can cause a difference in blood pressure between your right and left arms. A blood pressure reading that is higher than normal on one occasion does not mean that you need treatment. If  it is not clear whether you have high blood pressure, you may be asked to return on a different day to have your blood pressure checked again. Or, you may be asked to monitor your blood pressure at home for 1 or more weeks. TREATMENT Treating high blood pressure includes making lifestyle changes and possibly taking medicine. Living a healthy lifestyle can help lower high blood pressure. You may need to change some of your habits. Lifestyle changes may include:  Following the DASH diet. This diet is high in fruits, vegetables, and whole grains. It is low in salt, red meat, and added sugars.  Keep your sodium intake below 2,300 mg per day.  Getting at least 30-45 minutes of aerobic exercise at least 4 times per week.  Losing weight if necessary.  Not smoking.  Limiting alcoholic beverages.  Learning ways to reduce stress. Your health care provider may prescribe medicine if lifestyle changes are not enough to get your blood pressure under control, and if one of the following is true:  You are 18-59 years of age and your systolic blood pressure is above 140.  You are 60 years of age or older, and your systolic blood pressure is above 150.  Your diastolic blood pressure is above 90.  You have diabetes, and your systolic blood pressure is over 140 or your diastolic blood pressure is over 90.  You have kidney disease and your blood pressure is above 140/90.  You have heart disease and your blood pressure is above 140/90. Your personal target blood pressure may vary depending on your medical conditions, your age, and other factors. HOME CARE INSTRUCTIONS    Have your blood pressure rechecked as directed by your health care provider.   Take medicines only as directed by your health care provider. Follow the directions carefully. Blood pressure medicines must be taken as prescribed. The medicine does not work as well when you skip doses. Skipping doses also puts you at risk for  problems.  Do not smoke.   Monitor your blood pressure at home as directed by your health care provider. SEEK MEDICAL CARE IF:   You think you are having a reaction to medicines taken.  You have recurrent headaches or feel dizzy.  You have swelling in your ankles.  You have trouble with your vision. SEEK IMMEDIATE MEDICAL CARE IF:  You develop a severe headache or confusion.  You have unusual weakness, numbness, or feel faint.  You have severe chest or abdominal pain.  You vomit repeatedly.  You have trouble breathing. MAKE SURE YOU:   Understand these instructions.  Will watch your condition.  Will get help right away if you are not doing well or get worse.   This information is not intended to replace advice given to you by your health care provider. Make sure you discuss any questions you have with your health care provider.   Document Released: 11/28/2005 Document Revised: 04/14/2015 Document Reviewed: 09/20/2013 Elsevier Interactive Patient Education 2016 Elsevier Inc.  

## 2016-04-13 NOTE — Progress Notes (Signed)
[name] here for HFU for cellulitsis on R foot.

## 2016-04-13 NOTE — Progress Notes (Signed)
Subjective:    Patient ID: Jeffrey [name], male    DOB: 1966/03/28, 50 y.o.   MRN: [id number]  HPI 50 year old male with a history of type 2 diabetes mellitus (A1c 13.7), diabetic neuropathy, noncompliance with medical therapy, hypertension who comes into the clinic for hospital follow-up status post right great toe amputation secondary to osteomyelitis. He was referred to the ED from an office visit last month after his right big toe cellulitis failed to improve with oral conservative measures and was hospitalized from 03/30/16 through 04/03/16  Interval history He is accompanied by his fiance today who has been performing the dressing changes at home and he has seen his orthopedic-Dr. Sharol Given after discharge and is scheduled for another follow-up next week. His fiance is concerned that that toe nail of the second right toe came off and she has noticed some erythema of the second toe and is wondering if there is an underlying infection. I have reviewed his blood sugar logs which reveals fasting sugars in the 82-154 range and random sugars all under 200. Complains of occipital headache for the last 5 days and denies sinus tenderness or sinus pressure over respiratory symptoms; he has had some nausea and symptoms have resolved with the use of ibuprofen. Denies blurry vision or neck stiffness.  Past Medical History  Diagnosis Date  . Diabetes mellitus   . HTN (hypertension)   . Diabetic foot infection ([place]) 03/2016    RT FOOT    Past Surgical History  Procedure Laterality Date  . Back surgery      for abscess  . Amputation Right 04/01/2016    Procedure: Right Great Toe Amputation;  Surgeon: Newt Minion, MD;  Location: [place] [facility] [place];  Service: Orthopedics;  Laterality: Right;    Social History   Social History  . Marital Status: Significant Other    Spouse Name: N/A  . Number of Children: N/A  . Years of Education: N/A   Occupational History  . Not on file.   Social History Main  Topics  . Smoking status: Never Smoker   . Smokeless tobacco: Never Used  . Alcohol Use: No  . Drug Use: No  . Sexual Activity: Not on file   Other Topics Concern  . Not on file   Social History Narrative    No Known Allergies  Current Outpatient Prescriptions on File Prior to Visit  Medication Sig Dispense Refill  . ferrous sulfate 325 (65 FE) MG tablet Take 1 tablet (325 mg total) by mouth 2 (two) times daily with a meal. 60 tablet 3  . gabapentin (NEURONTIN) 400 MG capsule Take 1 capsule (400 mg total) by mouth 3 (three) times daily. 90 capsule 3  . HYDROcodone-acetaminophen (NORCO/VICODIN) 5-325 MG tablet Take 2 tablets by mouth every 6 (six) hours as needed for moderate pain or severe pain. 20 tablet 0  . insulin aspart (NOVOLOG) 100 UNIT/ML injection Inject 0-15 Units into the skin 3 (three) times daily with meals. Sliding scale  CBG 70 - 120: 0 units: CBG 121 - 140: 2 units; CBG 140 - 200: 4 units; CBG 201 - 240: 6 units; CBG 240 - 300: 8 units;CBG 300 - 350: 12 units; CBG 351 - 400: 16 units; CBG > 400 : 16 units and notify your  MD 10 mL 3  . Insulin Glargine (LANTUS SOLOSTAR) 100 UNIT/ML Solostar Pen Inject 60 Units into the skin daily at 10 pm. 5 pen 3  . Insulin Syringe-Needle U-100 (INSULIN SYRINGE 1CC/31GX5/16")  31G X 5/16" 1 ML MISC Check blood sugar TID & QHS 100 each 2  . lactulose (CHRONULAC) 10 GM/15ML solution Take 15 mLs (10 g total) by mouth 2 (two) times daily as needed for mild constipation. 946 mL 1  . mupirocin cream (BACTROBAN) 2 % Apply topically daily. Apply Bactroban to right great toe once a day and cover with foam dressing.  (Change foam dressing every 5 days or sooner if needed if dirty.) 15 g 0   No current facility-administered medications on file prior to visit.        Review of Systems  Constitutional: Negative for activity change and appetite change.  HENT: Negative for sinus pressure and sore throat.   Eyes: Negative for visual disturbance.    Respiratory: Negative for cough, chest tightness and shortness of breath.   Cardiovascular: Negative for chest pain and leg swelling.  Gastrointestinal: Negative for abdominal pain, diarrhea, constipation and abdominal distention.  Endocrine: Negative.   Genitourinary: Negative for dysuria.  Musculoskeletal:       See history of present illness  Skin: Negative for rash.  Allergic/Immunologic: Negative.   Neurological: Positive for headaches. Negative for weakness, light-headedness and numbness.  Psychiatric/Behavioral: Negative for suicidal ideas and dysphoric mood.       Objective: Filed Vitals:   04/13/16 1422  BP: 158/83  Pulse: 64  Temp: 98.3 F (36.8 C)  TempSrc: Oral  Resp: 16  Height: 5\' 7"  (1.702 m)  Weight: 197 lb 12.8 oz (89.721 kg)  SpO2: 93%      Physical Exam  Constitutional: He is oriented to person, place, and time. He appears well-developed and well-nourished.  Cardiovascular: Normal rate, normal heart sounds and intact distal pulses.   No murmur heard. Pulmonary/Chest: Effort normal and breath sounds normal. He has no wheezes. He has no rales. He exhibits no tenderness.  Abdominal: Soft. Bowel sounds are normal. He exhibits no distension and no mass. There is no tenderness.  Musculoskeletal: Normal range of motion.  Right great toe amputation. Second right toe with some edema, expulsion of nail of that toe Sensory loss present  Neurological: He is alert and oriented to person, place, and time.          Assessment & Plan:  1. Type 2 diabetes mellitus with hyperglycemia, with long-term current use of insulin (HCC) Uncontrolled with A1c of 13.7 due to noncompliance Blood sugar log reveals improvement at this time and the patient seems to be more motivated. Maintain on current regimen  2. Diabetic polyneuropathy associated with type 2 diabetes mellitus (HCC) Uncontrolled on gabapentin. Educated that strict glycemic control should bring about  improvement in symptoms  3. 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 Scheduled to see orthopedics next week. Dressing change performed in clinic, continue dressing changes at home as per protocol.   4. Essential hypertension Mildly elevated and above target of less than 140/90 Increase lisinopril from 10-20 mg Renal function at next visit. Continue low-sodium, DASH diet  5. Headache Unknown etiology We'll treat as migraines and Fioricet as needed His symptoms persist at next visit I will consider imaging.     This note has been created with Surveyor, quantity. Any transcriptional errors are unintentional.

## 2016-04-29 ENCOUNTER — Ambulatory Visit: Payer: Self-pay | Admitting: Family Medicine

## 2016-05-08 ENCOUNTER — Encounter (HOSPITAL_COMMUNITY): Payer: Self-pay | Admitting: *Deleted

## 2016-05-08 DIAGNOSIS — E119 Type 2 diabetes mellitus without complications: Secondary | ICD-10-CM | POA: Insufficient documentation

## 2016-05-08 DIAGNOSIS — Z794 Long term (current) use of insulin: Secondary | ICD-10-CM | POA: Insufficient documentation

## 2016-05-08 DIAGNOSIS — Z79899 Other long term (current) drug therapy: Secondary | ICD-10-CM | POA: Insufficient documentation

## 2016-05-08 DIAGNOSIS — I10 Essential (primary) hypertension: Secondary | ICD-10-CM | POA: Insufficient documentation

## 2016-05-08 DIAGNOSIS — Z792 Long term (current) use of antibiotics: Secondary | ICD-10-CM | POA: Insufficient documentation

## 2016-05-08 DIAGNOSIS — E1165 Type 2 diabetes mellitus with hyperglycemia: Secondary | ICD-10-CM | POA: Insufficient documentation

## 2016-05-08 DIAGNOSIS — R112 Nausea with vomiting, unspecified: Secondary | ICD-10-CM | POA: Insufficient documentation

## 2016-05-08 DIAGNOSIS — J40 Bronchitis, not specified as acute or chronic: Secondary | ICD-10-CM | POA: Insufficient documentation

## 2016-05-08 LAB — URINE MICROSCOPIC-ADD ON

## 2016-05-08 LAB — CBC
HCT: 37.9 % — ABNORMAL LOW (ref 39.0–52.0)
HEMOGLOBIN: 11.7 g/dL — AB (ref 13.0–17.0)
MCH: 22.9 pg — AB (ref 26.0–34.0)
MCHC: 30.9 g/dL (ref 30.0–36.0)
MCV: 74 fL — ABNORMAL LOW (ref 78.0–100.0)
PLATELETS: 309 10*3/uL (ref 150–400)
RBC: 5.12 MIL/uL (ref 4.22–5.81)
RDW: 15.3 % (ref 11.5–15.5)
WBC: 4.9 10*3/uL (ref 4.0–10.5)

## 2016-05-08 LAB — BASIC METABOLIC PANEL
ANION GAP: 7 (ref 5–15)
BUN: 14 mg/dL (ref 6–20)
CALCIUM: 9.4 mg/dL (ref 8.9–10.3)
CO2: 25 mmol/L (ref 22–32)
CREATININE: 1.05 mg/dL (ref 0.61–1.24)
Chloride: 102 mmol/L (ref 101–111)
GLUCOSE: 339 mg/dL — AB (ref 65–99)
Potassium: 3.6 mmol/L (ref 3.5–5.1)
Sodium: 134 mmol/L — ABNORMAL LOW (ref 135–145)

## 2016-05-08 LAB — URINALYSIS, ROUTINE W REFLEX MICROSCOPIC
BILIRUBIN URINE: NEGATIVE
Glucose, UA: >1000 mg/dL — AB
Ketones, ur: NEGATIVE mg/dL
Leukocytes, UA: NEGATIVE
NITRITE: NEGATIVE
PROTEIN: 100 mg/dL — AB
Specific Gravity, Urine: 1.015 (ref 1.005–1.030)
pH: 7 (ref 5.0–8.0)

## 2016-05-08 LAB — CBG MONITORING, ED: GLUCOSE-CAPILLARY: 318 mg/dL — AB (ref 65–99)

## 2016-05-08 NOTE — ED Notes (Signed)
Patient presents stating his sugar is up and has had a cough for 2 days CBG 318 in triage

## 2016-05-09 ENCOUNTER — Emergency Department (HOSPITAL_COMMUNITY): Payer: No Typology Code available for payment source

## 2016-05-09 ENCOUNTER — Emergency Department (HOSPITAL_COMMUNITY)
Admission: EM | Admit: 2016-05-09 | Discharge: 2016-05-09 | Disposition: A | Discharge status: Home / Self Care | Payer: No Typology Code available for payment source | Attending: Emergency Medicine | Admitting: Emergency Medicine

## 2016-05-09 DIAGNOSIS — R739 Hyperglycemia, unspecified: Secondary | ICD-10-CM

## 2016-05-09 DIAGNOSIS — R112 Nausea with vomiting, unspecified: Secondary | ICD-10-CM

## 2016-05-09 DIAGNOSIS — J40 Bronchitis, not specified as acute or chronic: Secondary | ICD-10-CM

## 2016-05-09 LAB — CBG MONITORING, ED: GLUCOSE-CAPILLARY: 334 mg/dL — AB (ref 65–99)

## 2016-05-09 MED ORDER — BENZONATATE 100 MG PO CAPS: 100.0000 mg | ORAL_CAPSULE | Freq: Three times a day (TID) | ORAL | Status: DC

## 2016-05-09 MED ORDER — ALBUTEROL SULFATE HFA 108 (90 BASE) MCG/ACT IN AERS: 2.0000 | INHALATION_SPRAY | RESPIRATORY_TRACT | Status: DC | PRN

## 2016-05-09 MED ORDER — INSULIN ASPART 100 UNIT/ML ~~LOC~~ SOLN
8.0000 [IU] | Freq: Once | SUBCUTANEOUS | Status: AC
  Administered 2016-05-09: 8 [IU] via SUBCUTANEOUS
  Filled 2016-05-09: qty 1

## 2016-05-09 MED ORDER — PROMETHAZINE HCL 25 MG PO TABS: 25.0000 mg | ORAL_TABLET | Freq: Four times a day (QID) | ORAL | Status: DC | PRN

## 2016-05-09 MED ORDER — SODIUM CHLORIDE 0.9 % IV BOLUS (SEPSIS)
1000.0000 mL | Freq: Once | INTRAVENOUS | Status: AC
  Administered 2016-05-09: 1000 mL via INTRAVENOUS

## 2016-05-09 NOTE — Discharge Instructions (Signed)
Hyperglycemia °Hyperglycemia occurs when the glucose (sugar) in your blood is too high. Hyperglycemia can happen for many reasons, but it most often happens to people who do not know they have diabetes or are not managing their diabetes properly.  °CAUSES  °Whether you have diabetes or not, there are other causes of hyperglycemia. Hyperglycemia can occur when you have diabetes, but it can also occur in other situations that you might not be as aware of, such as: °Diabetes °· If you have diabetes and are having problems controlling your blood glucose, hyperglycemia could occur because of some of the following reasons: °· Not following your meal plan. °· Not taking your diabetes medications or not taking it properly. °· Exercising less or doing less activity than you normally do. °· Being sick. °Pre-diabetes °· This cannot be ignored. Before people develop Type 2 diabetes, they almost always have "pre-diabetes." This is when your blood glucose levels are higher than normal, but not yet high enough to be diagnosed as diabetes. Research has shown that some long-term damage to the body, especially the heart and circulatory system, may already be occurring during pre-diabetes. If you take action to manage your blood glucose when you have pre-diabetes, you may delay or prevent Type 2 diabetes from developing. °Stress °· If you have diabetes, you may be "diet" controlled or on oral medications or insulin to control your diabetes. However, you may find that your blood glucose is higher than usual in the hospital whether you have diabetes or not. This is often referred to as "stress hyperglycemia." Stress can elevate your blood glucose. This happens because of hormones put out by the body during times of stress. If stress has been the cause of your high blood glucose, it can be followed regularly by your caregiver. That way he/she can make sure your hyperglycemia does not continue to get worse or progress to  diabetes. °Steroids °· Steroids are medications that act on the infection fighting system (immune system) to block inflammation or infection. One side effect can be a rise in blood glucose. Most people can produce enough extra insulin to allow for this rise, but for those who cannot, steroids make blood glucose levels go even higher. It is not unusual for steroid treatments to "uncover" diabetes that is developing. It is not always possible to determine if the hyperglycemia will go away after the steroids are stopped. A special blood test called an A1c is sometimes done to determine if your blood glucose was elevated before the steroids were started. °SYMPTOMS °· Thirsty. °· Frequent urination. °· Dry mouth. °· Blurred vision. °· Tired or fatigue. °· Weakness. °· Sleepy. °· Tingling in feet or leg. °DIAGNOSIS  °Diagnosis is made by monitoring blood glucose in one or all of the following ways: °· A1c test. This is a chemical found in your blood. °· Fingerstick blood glucose monitoring. °· Laboratory results. °TREATMENT  °First, knowing the cause of the hyperglycemia is important before the hyperglycemia can be treated. Treatment may include, but is not be limited to: °· Education. °· Change or adjustment in medications. °· Change or adjustment in meal plan. °· Treatment for an illness, infection, etc. °· More frequent blood glucose monitoring. °· Change in exercise plan. °· Decreasing or stopping steroids. °· Lifestyle changes. °HOME CARE INSTRUCTIONS  °· Test your blood glucose as directed. °· Exercise regularly. Your caregiver will give you instructions about exercise. Pre-diabetes or diabetes which comes on with stress is helped by exercising. °· Eat wholesome,   balanced meals. Eat often and at regular, fixed times. Your caregiver or nutritionist will give you a meal plan to guide your sugar intake.  Being at an ideal weight is important. If needed, losing as little as 10 to 15 pounds may help improve blood  glucose levels. SEEK MEDICAL CARE IF:   You have questions about medicine, activity, or diet.  You continue to have symptoms (problems such as increased thirst, urination, or weight gain). SEEK IMMEDIATE MEDICAL CARE IF:   You are vomiting or have diarrhea.  Your breath smells fruity.  You are breathing faster or slower.  You are very sleepy or incoherent.  You have numbness, tingling, or pain in your feet or hands.  You have chest pain.  Your symptoms get worse even though you have been following your caregiver's orders.  If you have any other questions or concerns.   This information is not intended to replace advice given to you by your health care provider. Make sure you discuss any questions you have with your health care provider.   Document Released: 05/24/2001 Document Revised: 02/20/2012 Document Reviewed: 08/04/2015 Elsevier Interactive Patient Education 2016 [employer]  Nausea and Vomiting Nausea means you feel sick to your stomach. Throwing up (vomiting) is a reflex where stomach contents come out of your mouth. HOME CARE   Take medicine as told by your doctor.  Do not force yourself to eat. However, you do need to drink fluids.  If you feel like eating, eat a normal diet as told by your doctor.  Eat rice, wheat, potatoes, bread, lean meats, yogurt, fruits, and vegetables.  Avoid high-fat foods.  Drink enough fluids to keep your pee (urine) clear or pale yellow.  Ask your doctor how to replace body fluid losses (rehydrate). Signs of body fluid loss (dehydration) include:  Feeling very thirsty.  Dry lips and mouth.  Feeling dizzy.  Dark pee.  Peeing less than normal.  Feeling confused.  Fast breathing or heart rate. GET HELP RIGHT AWAY IF:   You have blood in your throw up.  You have black or bloody poop (stool).  You have a bad headache or stiff neck.  You feel confused.  You have bad belly (abdominal) pain.  You have chest pain  or trouble breathing.  You do not pee at least once every 8 hours.  You have cold, clammy skin.  You keep throwing up after 24 to 48 hours.  You have a fever. MAKE SURE YOU:   Understand these instructions.  Will watch your condition.  Will get help right away if you are not doing well or get worse.   This information is not intended to replace advice given to you by your health care provider. Make sure you discuss any questions you have with your health care provider.   Document Released: 05/16/2008 Document Revised: 02/20/2012 Document Reviewed: 04/29/2011 Elsevier Interactive Patient Education 2016 Else[employer] Acute Bronchitis Bronchitis is inflammation of the airways that extend from the windpipe into the lungs (bronchi). The inflammation often causes mucus to develop. This leads to a cough, which is the most common symptom of bronchitis.  In acute bronchitis, the condition usually develops suddenly and goes away over time, usually in a couple weeks. Smoking, allergies, and asthma can make bronchitis worse. Repeated episodes of bronchitis may cause further lung problems.  CAUSES Acute bronchitis is most often caused by the same virus that causes a cold. The virus can spread from person to person (contagious) through  coughing, sneezing, and touching contaminated objects. SIGNS AND SYMPTOMS   Cough.   Fever.   Coughing up mucus.   Body aches.   Chest congestion.   Chills.   Shortness of breath.   Sore throat.  DIAGNOSIS  Acute bronchitis is usually diagnosed through a physical exam. Your health care provider will also ask you questions about your medical history. Tests, such as chest X-rays, are sometimes done to rule out other conditions.  TREATMENT  Acute bronchitis usually goes away in a couple weeks. Oftentimes, no medical treatment is necessary. Medicines are sometimes given for relief of fever or cough. Antibiotic medicines are usually not needed but  may be prescribed in certain situations. In some cases, an inhaler may be recommended to help reduce shortness of breath and control the cough. A cool mist vaporizer may also be used to help thin bronchial secretions and make it easier to clear the chest.  HOME CARE INSTRUCTIONS  Get plenty of rest.   Drink enough fluids to keep your urine clear or pale yellow (unless you have a medical condition that requires fluid restriction). Increasing fluids may help thin your respiratory secretions (sputum) and reduce chest congestion, and it will prevent dehydration.   Take medicines only as directed by your health care provider.  If you were prescribed an antibiotic medicine, finish it all even if you start to feel better.  Avoid smoking and secondhand smoke. Exposure to cigarette smoke or irritating chemicals will make bronchitis worse. If you are a smoker, consider using nicotine gum or skin patches to help control withdrawal symptoms. Quitting smoking will help your lungs heal faster.   Reduce the chances of another bout of acute bronchitis by washing your hands frequently, avoiding people with cold symptoms, and trying not to touch your hands to your mouth, nose, or eyes.   Keep all follow-up visits as directed by your health care provider.  SEEK MEDICAL CARE IF: Your symptoms do not improve after 1 week of treatment.  SEEK IMMEDIATE MEDICAL CARE IF:  You develop an increased fever or chills.   You have chest pain.   You have severe shortness of breath.  You have bloody sputum.   You develop dehydration.  You faint or repeatedly feel like you are going to pass out.  You develop repeated vomiting.  You develop a severe headache. MAKE SURE YOU:   Understand these instructions.  Will watch your condition.  Will get help right away if you are not doing well or get worse.   This information is not intended to replace advice given to you by your health care provider. Make  sure you discuss any questions you have with your health care provider.   Document Released: 01/05/2005 Document Revised: 12/19/2014 Document Reviewed: 05/21/2013 Elsevier Interactive Patient Education [employer].

## 2016-05-09 NOTE — ED Provider Notes (Signed)
CSN: [id number]     Arrival date & time 05/08/16  2222 History  By signing my name below, I, Stephania Fragmin, attest that this documentation has been prepared under the direction and in the presence of Orpah Greek, MD. Electronically Signed: Stephania Fragmin, ED Scribe. 05/09/2016. 2:43 AM.   Chief Complaint  Patient presents with  . Hyperglycemia  . Cough   The history is provided by the patient. No language interpreter was used.    HPI Comments: Jeffrey [name] is a 50 y.o. male with a history of DM, HTN, and diabetic right foot infection, who presents to the Emergency Department complaining of a constant, moderate, cough that began 2 days ago.   Patient also states his sugar has been running high for the past 3 days. He complains of associated vomiting for the past 2 nights. However, he denies nausea presently. He denies a history of ulcers or blockages. He denies any pain.    Past Medical History  Diagnosis Date  . Diabetes mellitus   . HTN (hypertension)   . Diabetic foot infection ([place]) 03/2016    RT FOOT   Past Surgical History  Procedure Laterality Date  . Back surgery      for abscess  . 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 Diabetes Mother   . Hypertension Mother   . Diabetes Sister    Social History  Substance Use Topics  . Smoking status: Never Smoker   . Smokeless tobacco: Never Used  . Alcohol Use: No    Review of Systems  Respiratory: Positive for cough.   Cardiovascular: Negative for chest pain.  Gastrointestinal: Negative for nausea and abdominal pain.  All other systems reviewed and are negative.     Allergies  Review of patient's allergies indicates no known allergies.  Home Medications   Prior to Admission medications   Medication Sig Start Date End Date Taking?  Authorizing Provider  butalbital-acetaminophen-caffeine (ESGIC) 50-325-40 MG tablet Take 1 tablet by mouth every 6 (six) hours as needed for headache. 04/13/16   Arnoldo Morale, MD  cephALEXin (KEFLEX) 500 MG capsule Take 1 capsule (500 mg total) by mouth 2 (two) times daily. 04/13/16   Arnoldo Morale, MD  ferrous sulfate 325 (65 FE) MG tablet Take 1 tablet (325 mg total) by mouth 2 (two) times daily with a meal. 04/03/16   Nishant Dhungel, MD  gabapentin (NEURONTIN) 400 MG capsule Take 1 capsule (400 mg total) by mouth 3 (three) times daily. 03/30/16   Arnoldo Morale, MD  HYDROcodone-acetaminophen (NORCO/VICODIN) 5-325 MG tablet Take 2 tablets by mouth every 6 (six) hours as needed for moderate pain or severe pain. 04/03/16   Nishant Dhungel, MD  insulin aspart (NOVOLOG) 100 UNIT/ML injection Inject 0-15 Units into the skin 3 (three) times daily with meals. Sliding scale  CBG 70 - 120: 0 units: CBG 121 - 140: 2 units; CBG 140 - 200: 4 units; CBG 201 - 240: 6 units; CBG 240 - 300: 8 units;CBG 300 - 350: 12 units; CBG 351 - 400: 16 units; CBG > 400 : 16 units and notify your  MD 04/03/16   Louellen Molder, MD  Insulin Glargine (LANTUS SOLOSTAR) 100 UNIT/ML Solostar Pen Inject 60 Units into the skin daily at 10 pm. 04/03/16   Louellen Molder, MD  Insulin Syringe-Needle U-100 (INSULIN SYRINGE 1CC/31GX5/16") 31G X 5/16" 1 ML MISC Check blood sugar TID & QHS 03/04/15   Lorayne Marek, MD  lactulose (CHRONULAC) 10 GM/15ML solution Take 15 mLs (10 g total) by mouth 2 (two) times daily as needed for mild constipation. 02/24/16   Arnoldo Morale, MD  lisinopril (PRINIVIL,ZESTRIL) 20 MG tablet Take 1 tablet (20 mg total) by mouth daily. 04/13/16   Arnoldo Morale, MD  mupirocin cream (BACTROBAN) 2 % Apply topically daily. Apply Bactroban to right great toe once a day and cover with foam dressing.  (Change foam dressing every 5 days or sooner if needed if dirty.) 02/20/16   Annita Brod, MD   BP 159/113 mmHg  Pulse 109  Temp(Src) 99  F (37.2 C) (Oral)  Resp 20  Wt 192 lb 4.8 oz (87.227 kg)  SpO2 99% Physical Exam  Constitutional: He is oriented to person, place, and time. He appears well-developed and well-nourished. No distress.  HENT:  Head: Normocephalic and atraumatic.  Right Ear: Hearing normal.  Left Ear: Hearing normal.  Nose: Nose normal.  Mouth/Throat: Oropharynx is clear and moist and mucous membranes are normal.  Eyes: Conjunctivae and EOM are normal. Pupils are equal, round, and reactive to light.  Neck: Normal range of motion. Neck supple.  Cardiovascular: Regular rhythm, S1 normal and S2 normal.  Exam reveals no gallop and no friction rub.   No murmur heard. Pulmonary/Chest: Effort normal and breath sounds normal. No respiratory distress. He exhibits no tenderness.  Abdominal: Soft. Normal appearance and bowel sounds are normal. There is no hepatosplenomegaly. There is no tenderness. There is no rebound, no guarding, no tenderness at McBurney's point and negative Murphy's sign. No hernia.  Musculoskeletal: Normal range of motion.  Neurological: He is alert and oriented to person, place, and time. He has normal strength. No cranial nerve deficit or sensory deficit. Coordination normal. GCS eye subscore is 4. GCS verbal subscore is 5. GCS motor subscore is 6.  Skin: Skin is warm, dry and intact. No rash noted. No cyanosis.  Psychiatric: He has a normal mood and affect. His speech is normal and behavior is normal. Thought content normal.  Nursing note and vitals reviewed.   ED Course  Procedures (including critical care time)  DIAGNOSTIC STUDIES: Oxygen Saturation is 99% on RA, normal by my interpretation.    COORDINATION OF CARE: 2:41 AM - CXR results discussed with pt. Discussed treatment plan with pt at bedside which includes iV fluids administered here. Pt verbalized understanding and agreed to plan.   Labs Review Labs Reviewed  BASIC METABOLIC PANEL - Abnormal; Notable for the following:     Sodium 134 (*)    Glucose, Bld 339 (*)    All other components within normal limits  CBC - Abnormal; Notable for the following:    Hemoglobin 11.7 (*)    HCT 37.9 (*)    MCV 74.0 (*)    MCH 22.9 (*)    All other components within normal limits  URINALYSIS, ROUTINE W REFLEX MICROSCOPIC (NOT AT [facility]) - Abnormal; Notable for the following:    Glucose, UA >1000 (*)    Hgb urine dipstick SMALL (*)    Protein, ur 100 (*)    All other components within normal limits  URINE MICROSCOPIC-ADD ON - Abnormal; Notable for the following:    Squamous Epithelial / LPF 0-5 (*)    Bacteria, UA RARE (*)    All other components within normal limits  CBG MONITORING,  ED - Abnormal; Notable for the following:    Glucose-Capillary 318 (*)    All other components within normal limits  CBG MONITORING, ED    Imaging Review Dg Chest 2 View  05/09/2016  CLINICAL DATA:  Acute onset of cough, shortness of breath and fever. Initial encounter. EXAM: CHEST  2 VIEW COMPARISON:  Chest radiograph performed 02/18/2016 FINDINGS: The lungs are well-aerated. Mild vascular congestion is noted. Mild left basilar atelectasis is seen. There is no evidence of focal opacification, pleural effusion or pneumothorax. The heart is borderline enlarged. No acute osseous abnormalities are seen. IMPRESSION: Mild vascular congestion and borderline cardiomegaly. Mild left basilar atelectasis noted. Electronically Signed   By: Garald Balding M.D.   On: 05/09/2016 02:32   I have personally reviewed and evaluated these images and lab results as part of my medical decision-making.  MDM   Final diagnoses:  Hyperglycemia  Nausea and vomiting, vomiting of unspecified type  Bronchitis   Patient presents to the emergency department for evaluation of elevated blood sugar. Patient also reporting cough and chest congestion. Both symptoms have been present for 2 or 3 days. Patient reports that he has been experiencing nausea and vomiting associated  with the symptoms. He has not had any associated fever or abdominal pain. There is no diarrhea.  [name] initial workup reveals hyperglycemia without evidence of DKA. Remainder of blood work and urinalysis were unremarkable. Patient reports multiple episodes of emesis. Patient will be treated for mild dehydration and hyperglycemia with IV fluids. Cough is likely viral in nature. Chest x-ray does not show any evidence of pneumonia. Will treat symptomatically.  I personally performed the services described in this documentation, which was scribed in my presence. The recorded information has been reviewed and is accurate.     Orpah Greek, MD 05/09/16 0300

## 2016-05-11 MED FILL — PROMETHAZINE 25 MG TABLET: 25 | 7 days supply | Qty: 30 | Fill #0

## 2016-05-11 MED FILL — !NOVOLOG 100UNITS/ML VIAL: 100/ML | 20 days supply | Qty: 10 | Fill #0

## 2016-05-11 MED FILL — !LANTUS SOLOSTAR 100UNITS/M: 100 | 25 days supply | Qty: 15 | Fill #0

## 2016-05-11 MED FILL — BENZONATATE 100 MG CAPSULE: 100 | 7 days supply | Qty: 21 | Fill #0

## 2016-05-11 MED FILL — !VENTOLIN HFA INHALER: 108 (90 BAS | 25 days supply | Qty: 18 | Fill #0

## 2016-05-27 ENCOUNTER — Ambulatory Visit: Payer: Self-pay | Admitting: Family Medicine

## 2016-06-17 ENCOUNTER — Encounter (HOSPITAL_COMMUNITY): Payer: Self-pay | Admitting: *Deleted

## 2016-06-17 ENCOUNTER — Inpatient Hospital Stay (HOSPITAL_COMMUNITY)
Admission: EM | Admit: 2016-06-17 | Discharge: 2016-06-21 | DRG: 617 | Disposition: A | Discharge status: Home / Self Care | Payer: Self-pay | Attending: Internal Medicine | Admitting: Internal Medicine

## 2016-06-17 ENCOUNTER — Emergency Department (HOSPITAL_COMMUNITY): Payer: Self-pay

## 2016-06-17 DIAGNOSIS — L089 Local infection of the skin and subcutaneous tissue, unspecified: Secondary | ICD-10-CM

## 2016-06-17 DIAGNOSIS — E11628 Type 2 diabetes mellitus with other skin complications: Secondary | ICD-10-CM | POA: Diagnosis present

## 2016-06-17 DIAGNOSIS — E1165 Type 2 diabetes mellitus with hyperglycemia: Secondary | ICD-10-CM

## 2016-06-17 DIAGNOSIS — Z9119 Patient's noncompliance with other medical treatment and regimen: Secondary | ICD-10-CM

## 2016-06-17 DIAGNOSIS — Z89411 Acquired absence of right great toe: Secondary | ICD-10-CM

## 2016-06-17 DIAGNOSIS — R609 Edema, unspecified: Secondary | ICD-10-CM | POA: Diagnosis present

## 2016-06-17 DIAGNOSIS — M869 Osteomyelitis, unspecified: Secondary | ICD-10-CM | POA: Diagnosis present

## 2016-06-17 DIAGNOSIS — E871 Hypo-osmolality and hyponatremia: Secondary | ICD-10-CM | POA: Diagnosis present

## 2016-06-17 DIAGNOSIS — B999 Unspecified infectious disease: Secondary | ICD-10-CM

## 2016-06-17 DIAGNOSIS — E11621 Type 2 diabetes mellitus with foot ulcer: Principal | ICD-10-CM | POA: Diagnosis present

## 2016-06-17 DIAGNOSIS — E1140 Type 2 diabetes mellitus with diabetic neuropathy, unspecified: Secondary | ICD-10-CM | POA: Diagnosis present

## 2016-06-17 DIAGNOSIS — Z8249 Family history of ischemic heart disease and other diseases of the circulatory system: Secondary | ICD-10-CM

## 2016-06-17 DIAGNOSIS — Z833 Family history of diabetes mellitus: Secondary | ICD-10-CM

## 2016-06-17 DIAGNOSIS — L03115 Cellulitis of right lower limb: Secondary | ICD-10-CM

## 2016-06-17 DIAGNOSIS — Z79899 Other long term (current) drug therapy: Secondary | ICD-10-CM

## 2016-06-17 DIAGNOSIS — Z794 Long term (current) use of insulin: Secondary | ICD-10-CM

## 2016-06-17 DIAGNOSIS — E1152 Type 2 diabetes mellitus with diabetic peripheral angiopathy with gangrene: Secondary | ICD-10-CM | POA: Diagnosis present

## 2016-06-17 DIAGNOSIS — E1169 Type 2 diabetes mellitus with other specified complication: Secondary | ICD-10-CM | POA: Diagnosis present

## 2016-06-17 DIAGNOSIS — E876 Hypokalemia: Secondary | ICD-10-CM | POA: Diagnosis present

## 2016-06-17 DIAGNOSIS — I10 Essential (primary) hypertension: Secondary | ICD-10-CM | POA: Diagnosis present

## 2016-06-17 DIAGNOSIS — D509 Iron deficiency anemia, unspecified: Secondary | ICD-10-CM | POA: Diagnosis present

## 2016-06-17 DIAGNOSIS — L739 Follicular disorder, unspecified: Secondary | ICD-10-CM | POA: Diagnosis present

## 2016-06-17 DIAGNOSIS — E118 Type 2 diabetes mellitus with unspecified complications: Secondary | ICD-10-CM

## 2016-06-17 DIAGNOSIS — R739 Hyperglycemia, unspecified: Secondary | ICD-10-CM

## 2016-06-17 LAB — CBG MONITORING, ED: Glucose-Capillary: 461 mg/dL — ABNORMAL HIGH (ref 65–99)

## 2016-06-17 LAB — COMPREHENSIVE METABOLIC PANEL
ALBUMIN: 2.9 g/dL — AB (ref 3.5–5.0)
ALK PHOS: 95 U/L (ref 38–126)
ALT: 9 U/L — ABNORMAL LOW (ref 17–63)
ANION GAP: 8 (ref 5–15)
AST: 12 U/L — AB (ref 15–41)
CALCIUM: 8.9 mg/dL (ref 8.9–10.3)
CO2: 22 mmol/L (ref 22–32)
Chloride: 100 mmol/L — ABNORMAL LOW (ref 101–111)
Creatinine, Ser: 0.89 mg/dL (ref 0.61–1.24)
GFR calc Af Amer: >60 mL/min (ref >60)
GFR calc non Af Amer: >60 mL/min (ref >60)
GLUCOSE: 477 mg/dL — AB (ref 65–99)
POTASSIUM: 3.4 mmol/L — AB (ref 3.5–5.1)
SODIUM: 130 mmol/L — AB (ref 135–145)
Total Bilirubin: 0.3 mg/dL (ref 0.3–1.2)
Total Protein: 7.4 g/dL (ref 6.5–8.1)

## 2016-06-17 LAB — CBC
HEMATOCRIT: 37.5 % — AB (ref 39.0–52.0)
HEMOGLOBIN: 11.6 g/dL — AB (ref 13.0–17.0)
MCH: 22.8 pg — AB (ref 26.0–34.0)
MCHC: 30.9 g/dL (ref 30.0–36.0)
MCV: 73.8 fL — ABNORMAL LOW (ref 78.0–100.0)
Platelets: 348 10*3/uL (ref 150–400)
RBC: 5.08 MIL/uL (ref 4.22–5.81)
RDW: 15.7 % — ABNORMAL HIGH (ref 11.5–15.5)
WBC: 6.4 10*3/uL (ref 4.0–10.5)

## 2016-06-17 MED ORDER — DEXTROSE 5 % IV SOLN
2.0000 g | INTRAVENOUS | Status: DC
  Administered 2016-06-17: 2 g via INTRAVENOUS
  Filled 2016-06-17: qty 2

## 2016-06-17 MED ORDER — INSULIN ASPART 100 UNIT/ML ~~LOC~~ SOLN
10.0000 [IU] | Freq: Once | SUBCUTANEOUS | Status: AC
  Administered 2016-06-17: 10 [IU] via SUBCUTANEOUS
  Filled 2016-06-17: qty 1

## 2016-06-17 MED ORDER — METRONIDAZOLE IN NACL 5-0.79 MG/ML-% IV SOLN: 500.0000 mg | Freq: Three times a day (TID) | INTRAVENOUS | Status: DC

## 2016-06-17 MED ORDER — VANCOMYCIN HCL IN DEXTROSE 1-5 GM/200ML-% IV SOLN: 1000.0000 mg | Freq: Once | INTRAVENOUS | Status: DC

## 2016-06-17 MED ORDER — VANCOMYCIN HCL IN DEXTROSE 750-5 MG/150ML-% IV SOLN
750.0000 mg | Freq: Three times a day (TID) | INTRAVENOUS | Status: DC
  Filled 2016-06-17 (×2): qty 150

## 2016-06-17 MED ORDER — SODIUM CHLORIDE 0.9 % IV BOLUS (SEPSIS)
1000.0000 mL | Freq: Once | INTRAVENOUS | Status: AC
  Administered 2016-06-17: 1000 mL via INTRAVENOUS

## 2016-06-17 NOTE — ED Provider Notes (Signed)
CSN: [id number]     Arrival date & time 06/17/16  1817 History   First MD Initiated Contact with Patient 06/17/16 2209     Chief Complaint  Patient presents with  . Foot Problem     (Consider location/radiation/quality/duration/timing/severity/associated sxs/prior Treatment) The history is provided by the patient and medical records. No language interpreter was used.     Jeffrey [name] is a 50 y.o. male  with a hx of IDDM, HTN, s/p right great toe infection and amputation (37mos ago by Dr. Sharol Given) presents to the Emergency Department complaining of gradual, persistent, progressively worsening right 2nd toe infection onset 5 days ago with associated spreading edema, erythema and increased warmth up the leg.  Pt also reports increasing blood sugars at home.  Nothing makes it better and nothing makes it worse.  Pt denies fever, chills, headache, neck pain, chest pain, SOB, abd pain, N/V/D, weakness, dizziness, syncope.  Pt also reports associated diabetic "ulcers" popping up all over his body along with polyuria and polydipsia.       Past Medical History  Diagnosis Date  . Diabetes mellitus   . HTN (hypertension)   . Diabetic foot infection ([place]) 03/2016    RT FOOT   Past Surgical History  Procedure Laterality Date  . Back surgery      for abscess  . Amputation Right 04/01/2016    Procedure: Right Great Toe Amputation;  Surgeon: Newt Minion, MD;  Location: [place];  Service: Orthopedics;  Laterality: Right;   Family History  Problem Relation Age of Onset  . Diabetes Mother   . Hypertension Mother   . Diabetes Sister    Social History  Substance Use Topics  . Smoking status: Never Smoker   . Smokeless tobacco: Never Used  . Alcohol Use: No    Review of Systems  Constitutional: Negative for fever, diaphoresis, appetite change, fatigue and unexpected weight change.  HENT: Negative for mouth sores.   Eyes: Negative for visual disturbance.  Respiratory: Negative for cough, chest  tightness, shortness of breath and wheezing.   Cardiovascular: Positive for leg swelling (right leg). Negative for chest pain.  Gastrointestinal: Negative for nausea, vomiting, abdominal pain, diarrhea and constipation.  Endocrine: Negative for polydipsia, polyphagia and polyuria.  Genitourinary: Negative for dysuria, urgency, frequency and hematuria.  Musculoskeletal: Positive for arthralgias (right foot). Negative for back pain and neck stiffness.  Skin: Positive for wound ( right 2nd toe). Negative for rash.  Allergic/Immunologic: Negative for immunocompromised state.  Neurological: Negative for syncope, light-headedness and headaches.  Hematological: Does not bruise/bleed easily.  Psychiatric/Behavioral: Negative for sleep disturbance. The patient is not nervous/anxious.       Allergies  Review of patient's allergies indicates no known allergies.  Home Medications   Prior to Admission medications   Medication Sig Start Date End Date Taking? Authorizing Provider  albuterol (PROVENTIL HFA;VENTOLIN HFA) 108 (90 Base) MCG/ACT inhaler Inhale 2 puffs into the lungs every 4 (four) hours as needed for wheezing or shortness of breath. 05/09/16  Yes Orpah Greek, MD  butalbital-acetaminophen-caffeine (ESGIC) 50-325-40 MG tablet Take 1 tablet by mouth every 6 (six) hours as needed for headache. 04/13/16  Yes Arnoldo Morale, MD  ferrous sulfate 325 (65 FE) MG tablet Take 1 tablet (325 mg total) by mouth 2 (two) times daily with a meal. 04/03/16  Yes Nishant Dhungel, MD  gabapentin (NEURONTIN) 400 MG capsule Take 1 capsule (400 mg total) by mouth 3 (three) times daily. 03/30/16  Yes Enobong  Amao, MD  insulin aspart (NOVOLOG) 100 UNIT/ML injection Inject 0-15 Units into the skin 3 (three) times daily with meals. Sliding scale  CBG 70 - 120: 0 units: CBG 121 - 140: 2 units; CBG 140 - 200: 4 units; CBG 201 - 240: 6 units; CBG 240 - 300: 8 units;CBG 300 - 350: 12 units; CBG 351 - 400: 16 units; CBG >  400 : 16 units and notify your  MD 04/03/16  Yes Nishant Dhungel, MD  Insulin Glargine (LANTUS SOLOSTAR) 100 UNIT/ML Solostar Pen Inject 60 Units into the skin daily at 10 pm. Patient taking differently: Inject 66 Units into the skin daily at 10 pm.  04/03/16  Yes Nishant Dhungel, MD  lactulose (CHRONULAC) 10 GM/15ML solution Take 15 mLs (10 g total) by mouth 2 (two) times daily as needed for mild constipation. 02/24/16  Yes Arnoldo Morale, MD  lisinopril (PRINIVIL,ZESTRIL) 20 MG tablet Take 1 tablet (20 mg total) by mouth daily. 04/13/16  Yes Arnoldo Morale, MD  promethazine (PHENERGAN) 25 MG tablet Take 1 tablet (25 mg total) by mouth every 6 (six) hours as needed for nausea or vomiting. 05/09/16  Yes Orpah Greek, MD  benzonatate (TESSALON) 100 MG capsule Take 1 capsule (100 mg total) by mouth every 8 (eight) hours. 05/09/16   Orpah Greek, MD  cephALEXin (KEFLEX) 500 MG capsule Take 1 capsule (500 mg total) by mouth 2 (two) times daily. 04/13/16   Arnoldo Morale, MD  HYDROcodone-acetaminophen (NORCO/VICODIN) 5-325 MG tablet Take 2 tablets by mouth every 6 (six) hours as needed for moderate pain or severe pain. 04/03/16   Nishant Dhungel, MD  Insulin Syringe-Needle U-100 (INSULIN SYRINGE 1CC/31GX5/16") 31G X 5/16" 1 ML MISC Check blood sugar TID & QHS 03/04/15   Lorayne Marek, MD  mupirocin cream (BACTROBAN) 2 % Apply topically daily. Apply Bactroban to right great toe once a day and cover with foam dressing.  (Change foam dressing every 5 days or sooner if needed if dirty.) 02/20/16   Annita Brod, MD   BP 154/93 mmHg  Pulse 91  Temp(Src) 99.5 F (37.5 C) (Oral)  Resp 18  SpO2 99% Physical Exam  Constitutional: He appears well-developed and well-nourished. No distress.  Awake, alert, nontoxic appearance  HENT:  Head: Normocephalic and atraumatic.  Mouth/Throat: Oropharynx is clear and moist. No oropharyngeal exudate.  Eyes: Conjunctivae are normal. No scleral icterus.  Neck: Normal  range of motion. Neck supple.  Cardiovascular: Normal rate, regular rhythm and intact distal pulses.   Pulmonary/Chest: Effort normal and breath sounds normal. No respiratory distress. He has no wheezes.  Equal chest expansion  Abdominal: Soft. Bowel sounds are normal. He exhibits no mass. There is no tenderness. There is no rebound and no guarding.  Musculoskeletal: Normal range of motion. He exhibits no edema.  Right great toe surgically absent and with well healing incision Right second toe with large ulcer to the plantar surface with purulent drainage and discoloration of the dorsum of the toe Right 3rd toe with small wound and discoloration 2+ pitting edema of the RLE with erythema and increased warmth.  Neurological: He is alert.  Speech is clear and goal oriented Moves extremities without ataxia  Skin: Skin is warm and dry. He is not diaphoretic.  Psychiatric: He has a normal mood and affect.  Nursing note and vitals reviewed.   ED Course  Procedures (including critical care time) Labs Review Labs Reviewed  COMPREHENSIVE METABOLIC PANEL - Abnormal; Notable for the following:  Sodium 130 (*)    Potassium 3.4 (*)    Chloride 100 (*)    Glucose, Bld 477 (*)    BUN <5 (*)    Albumin 2.9 (*)    AST 12 (*)    ALT 9 (*)    All other components within normal limits  CBC - Abnormal; Notable for the following:    Hemoglobin 11.6 (*)    HCT 37.5 (*)    MCV 73.8 (*)    MCH 22.8 (*)    RDW 15.7 (*)    All other components within normal limits  SEDIMENTATION RATE - Abnormal; Notable for the following:    Sed Rate 67 (*)    All other components within normal limits  C-REACTIVE PROTEIN - Abnormal; Notable for the following:    CRP 6.8 (*)    All other components within normal limits  PREALBUMIN - Abnormal; Notable for the following:    Prealbumin 16.0 (*)    All other components within normal limits  CBG MONITORING, ED - Abnormal; Notable for the following:     Glucose-Capillary 461 (*)    All other components within normal limits  CULTURE, BLOOD (ROUTINE X 2)  CULTURE, BLOOD (ROUTINE X 2)  HEMOGLOBIN A1C  HIV ANTIBODY (ROUTINE TESTING)  URINALYSIS, ROUTINE W REFLEX MICROSCOPIC (NOT AT [facility])    Imaging Review Ap / Lateral X-ray Right Foot  06/18/2016  CLINICAL DATA:  Infection.  Right foot pain. EXAM: RIGHT FOOT - 2 VIEW COMPARISON:  Radiographs 03/30/2016 FINDINGS: Post resection of the great toe. Soft tissue prominence at the resection bed without evident ulceration. Soft tissue air about the distal aspect of the second toe with second toe edema. Probable erosion about the second toe distal phalanx. Hammertoe deformity of the digits. There is diffuse dorsal soft tissue edema. Degenerative change in the midfoot. Vascular calcifications are seen. IMPRESSION: Findings suspicious for osteomyelitis of the second toe distal phalanx with adjacent soft tissue air, probable gangrene. Post resection of the great toe. Electronically Signed   By: Jeb Levering M.D.   On: 06/18/2016 00:24   I have personally reviewed and evaluated these images and lab results as part of my medical decision-making.    MDM   Final diagnoses:  Infection  Type 2 diabetes mellitus with complication, with long-term current use of insulin (HCC)  Hyperglycemia  Cellulitis of right foot  Diabetic foot infection ([place])   Greggory Keen presents with right 2nd and 3rd toe infections.  Pt With recent indentation of the right great toe by Dr. Sharol Given. Labs show hyponatremia, mild anemia and mild hypokalemia.  X-ray shows likely osteomyelitis of the second toe distal phalanx with adjacent soft tissue air and probable gangrene.  Antibiotics started. Blood cultures obtained. Patient will be admitted.  MRI of the foot for further characterization is pending.    12:51 AM Discussed with Dr. Loleta Books who recommends med-surg bed.     BP 154/93 mmHg  Pulse 91  Temp(Src) 99.5 F (37.5 C)  (Oral)  Resp 18  SpO2 99%    Abigail Butts, PA-C 06/18/16 [place], MD 06/20/16 [phone]

## 2016-06-17 NOTE — ED Notes (Signed)
Pt c/o his right foot/toes swelling and draining x 5 days

## 2016-06-17 NOTE — ED Notes (Signed)
CBG 461. RN notified.

## 2016-06-17 NOTE — ED Notes (Signed)
Pt to xray via stretcher

## 2016-06-17 NOTE — Progress Notes (Addendum)
Pharmacy Antibiotic Note  Jeffrey [name] is a 50 y.o. male admitted on 06/17/2016 with wound infection.  Pharmacy has been consulted for Vancomycin dosing. Big toe amputation 2 months ago per pt. WBC WNL. Renal function ok. No x-ray yet.   Plan: -Vancomycin 750 mg IV q8h (based on levels at previous admission) -Ceftriaxone/Flagyl per MD -Trend WBC, temp, renal function -Drug levels as indicated   Temp (24hrs), Avg:99.5 F (37.5 C), Min:99.5 F (37.5 C), Max:99.5 F (37.5 C)   Recent Labs Lab 06/17/16 1829  WBC 6.4  CREATININE 0.89    CrCl cannot be calculated (Unknown ideal weight.).    No Known Allergies  Narda Bonds 06/17/2016 11:11 PM

## 2016-06-17 NOTE — ED Notes (Signed)
Pt reports having right big toe amputated two months ago and now has drainage and foul odor from 2nd toe.

## 2016-06-18 ENCOUNTER — Emergency Department (HOSPITAL_COMMUNITY): Payer: Self-pay

## 2016-06-18 ENCOUNTER — Encounter (HOSPITAL_COMMUNITY): Payer: Self-pay | Admitting: Family Medicine

## 2016-06-18 DIAGNOSIS — L739 Follicular disorder, unspecified: Secondary | ICD-10-CM | POA: Diagnosis present

## 2016-06-18 DIAGNOSIS — E876 Hypokalemia: Secondary | ICD-10-CM | POA: Diagnosis present

## 2016-06-18 DIAGNOSIS — E1165 Type 2 diabetes mellitus with hyperglycemia: Secondary | ICD-10-CM

## 2016-06-18 DIAGNOSIS — E1142 Type 2 diabetes mellitus with diabetic polyneuropathy: Secondary | ICD-10-CM

## 2016-06-18 DIAGNOSIS — E1169 Type 2 diabetes mellitus with other specified complication: Secondary | ICD-10-CM

## 2016-06-18 DIAGNOSIS — I10 Essential (primary) hypertension: Secondary | ICD-10-CM

## 2016-06-18 DIAGNOSIS — L089 Local infection of the skin and subcutaneous tissue, unspecified: Secondary | ICD-10-CM

## 2016-06-18 DIAGNOSIS — Z794 Long term (current) use of insulin: Secondary | ICD-10-CM

## 2016-06-18 DIAGNOSIS — E871 Hypo-osmolality and hyponatremia: Secondary | ICD-10-CM

## 2016-06-18 LAB — PREALBUMIN: PREALBUMIN: 16 mg/dL — AB (ref 18–38)

## 2016-06-18 LAB — URINE MICROSCOPIC-ADD ON

## 2016-06-18 LAB — BASIC METABOLIC PANEL
ANION GAP: 6 (ref 5–15)
BUN: <5 mg/dL — ABNORMAL LOW (ref 6–20)
CO2: 24 mmol/L (ref 22–32)
Calcium: 8.6 mg/dL — ABNORMAL LOW (ref 8.9–10.3)
Chloride: 105 mmol/L (ref 101–111)
Creatinine, Ser: 0.62 mg/dL (ref 0.61–1.24)
Glucose, Bld: 186 mg/dL — ABNORMAL HIGH (ref 65–99)
POTASSIUM: 3 mmol/L — AB (ref 3.5–5.1)
SODIUM: 135 mmol/L (ref 135–145)

## 2016-06-18 LAB — CBG MONITORING, ED: GLUCOSE-CAPILLARY: 239 mg/dL — AB (ref 65–99)

## 2016-06-18 LAB — SURGICAL PCR SCREEN
MRSA, PCR: NEGATIVE
Staphylococcus aureus: POSITIVE — AB

## 2016-06-18 LAB — URINALYSIS, ROUTINE W REFLEX MICROSCOPIC
BILIRUBIN URINE: NEGATIVE
Glucose, UA: >1000 mg/dL — AB
KETONES UR: NEGATIVE mg/dL
LEUKOCYTES UA: NEGATIVE
NITRITE: NEGATIVE
PROTEIN: 100 mg/dL — AB
Specific Gravity, Urine: 1.026 (ref 1.005–1.030)
pH: 6.5 (ref 5.0–8.0)

## 2016-06-18 LAB — C-REACTIVE PROTEIN: CRP: 6.8 mg/dL — AB (ref <1.0)

## 2016-06-18 LAB — CBC
HEMATOCRIT: 34.8 % — AB (ref 39.0–52.0)
HEMOGLOBIN: 10.7 g/dL — AB (ref 13.0–17.0)
MCH: 22.7 pg — ABNORMAL LOW (ref 26.0–34.0)
MCHC: 30.7 g/dL (ref 30.0–36.0)
MCV: 73.9 fL — ABNORMAL LOW (ref 78.0–100.0)
Platelets: 338 10*3/uL (ref 150–400)
RBC: 4.71 MIL/uL (ref 4.22–5.81)
RDW: 15.7 % — AB (ref 11.5–15.5)
WBC: 5.5 10*3/uL (ref 4.0–10.5)

## 2016-06-18 LAB — GLUCOSE, CAPILLARY
GLUCOSE-CAPILLARY: 89 mg/dL (ref 65–99)
GLUCOSE-CAPILLARY: 93 mg/dL (ref 65–99)
GLUCOSE-CAPILLARY: 174 mg/dL — AB (ref 65–99)
Glucose-Capillary: 215 mg/dL — ABNORMAL HIGH (ref 65–99)
Glucose-Capillary: 135 mg/dL — ABNORMAL HIGH (ref 65–99)

## 2016-06-18 LAB — SEDIMENTATION RATE: SED RATE: 67 mm/h — AB (ref 0–16)

## 2016-06-18 LAB — IRON AND TIBC
IRON: 22 ug/dL — AB (ref 45–182)
SATURATION RATIOS: 9 % — AB (ref 17.9–39.5)
TIBC: 234 ug/dL — AB (ref 250–450)
UIBC: 212 ug/dL

## 2016-06-18 LAB — MAGNESIUM: Magnesium: 1.6 mg/dL — ABNORMAL LOW (ref 1.7–2.4)

## 2016-06-18 LAB — FERRITIN: FERRITIN: 74 ng/mL (ref 24–336)

## 2016-06-18 MED ORDER — INSULIN ASPART 100 UNIT/ML ~~LOC~~ SOLN
0.0000 [IU] | Freq: Every day | SUBCUTANEOUS | Status: DC
  Administered 2016-06-19: 2 [IU] via SUBCUTANEOUS

## 2016-06-18 MED ORDER — ENOXAPARIN SODIUM 40 MG/0.4ML ~~LOC~~ SOLN
40.0000 mg | Freq: Every day | SUBCUTANEOUS | Status: DC
  Administered 2016-06-18 – 2016-06-21 (×4): 40 mg via SUBCUTANEOUS
  Filled 2016-06-18 (×4): qty 0.4

## 2016-06-18 MED ORDER — PIPERACILLIN-TAZOBACTAM 3.375 G IVPB
3.3750 g | Freq: Three times a day (TID) | INTRAVENOUS | Status: DC
  Administered 2016-06-18 – 2016-06-21 (×9): 3.375 g via INTRAVENOUS
  Filled 2016-06-18 (×11): qty 50

## 2016-06-18 MED ORDER — POTASSIUM CHLORIDE CRYS ER 20 MEQ PO TBCR
40.0000 meq | EXTENDED_RELEASE_TABLET | Freq: Once | ORAL | Status: AC
  Administered 2016-06-18: 40 meq via ORAL
  Filled 2016-06-18: qty 2

## 2016-06-18 MED ORDER — ONDANSETRON HCL 4 MG/2ML IJ SOLN: 4.0000 mg | Freq: Four times a day (QID) | INTRAMUSCULAR | Status: DC | PRN

## 2016-06-18 MED ORDER — FERROUS SULFATE 325 (65 FE) MG PO TABS
325.0000 mg | ORAL_TABLET | Freq: Two times a day (BID) | ORAL | Status: DC
  Administered 2016-06-18 – 2016-06-21 (×6): 325 mg via ORAL
  Filled 2016-06-18 (×6): qty 1

## 2016-06-18 MED ORDER — ACETAMINOPHEN 650 MG RE SUPP: 650.0000 mg | Freq: Four times a day (QID) | RECTAL | Status: DC | PRN

## 2016-06-18 MED ORDER — MUPIROCIN 2 % EX OINT
1.0000 "application " | TOPICAL_OINTMENT | Freq: Two times a day (BID) | CUTANEOUS | Status: DC
  Administered 2016-06-18 – 2016-06-21 (×6): 1 via NASAL
  Filled 2016-06-18: qty 22

## 2016-06-18 MED ORDER — SENNOSIDES-DOCUSATE SODIUM 8.6-50 MG PO TABS: 1.0000 | ORAL_TABLET | Freq: Every evening | ORAL | Status: DC | PRN

## 2016-06-18 MED ORDER — CHLORHEXIDINE GLUCONATE CLOTH 2 % EX PADS
6.0000 | MEDICATED_PAD | Freq: Every day | CUTANEOUS | Status: DC
  Administered 2016-06-18 – 2016-06-20 (×2): 6 via TOPICAL

## 2016-06-18 MED ORDER — ACETAMINOPHEN 325 MG PO TABS: 650.0000 mg | ORAL_TABLET | Freq: Four times a day (QID) | ORAL | Status: DC | PRN

## 2016-06-18 MED ORDER — LIVING WELL WITH DIABETES BOOK
Freq: Once | Status: AC
  Administered 2016-06-18: 15:00:00
  Filled 2016-06-18 (×2): qty 1

## 2016-06-18 MED ORDER — VANCOMYCIN HCL IN DEXTROSE 750-5 MG/150ML-% IV SOLN
750.0000 mg | Freq: Three times a day (TID) | INTRAVENOUS | Status: DC
  Administered 2016-06-18 – 2016-06-21 (×9): 750 mg via INTRAVENOUS
  Filled 2016-06-18 (×11): qty 150

## 2016-06-18 MED ORDER — INSULIN ASPART 100 UNIT/ML ~~LOC~~ SOLN
0.0000 [IU] | Freq: Three times a day (TID) | SUBCUTANEOUS | Status: DC
  Administered 2016-06-18 – 2016-06-19 (×2): 3 [IU] via SUBCUTANEOUS
  Administered 2016-06-19: 4 [IU] via SUBCUTANEOUS
  Administered 2016-06-20 (×2): 3 [IU] via SUBCUTANEOUS

## 2016-06-18 MED ORDER — ONDANSETRON HCL 4 MG PO TABS: 4.0000 mg | ORAL_TABLET | Freq: Four times a day (QID) | ORAL | Status: DC | PRN

## 2016-06-18 MED ORDER — HYDROCODONE-ACETAMINOPHEN 5-325 MG PO TABS: 1.0000 | ORAL_TABLET | ORAL | Status: DC | PRN

## 2016-06-18 MED ORDER — GABAPENTIN 400 MG PO CAPS
400.0000 mg | ORAL_CAPSULE | Freq: Three times a day (TID) | ORAL | Status: DC
  Administered 2016-06-18 – 2016-06-21 (×11): 400 mg via ORAL
  Filled 2016-06-18 (×11): qty 1

## 2016-06-18 MED ORDER — LISINOPRIL 20 MG PO TABS
20.0000 mg | ORAL_TABLET | Freq: Every day | ORAL | Status: DC
  Administered 2016-06-18 – 2016-06-21 (×4): 20 mg via ORAL
  Filled 2016-06-18 (×4): qty 1

## 2016-06-18 MED ORDER — INSULIN GLARGINE 100 UNIT/ML ~~LOC~~ SOLN
66.0000 [IU] | Freq: Every day | SUBCUTANEOUS | Status: DC
  Administered 2016-06-18 – 2016-06-20 (×4): 66 [IU] via SUBCUTANEOUS
  Filled 2016-06-18 (×5): qty 0.66

## 2016-06-18 NOTE — ED Notes (Signed)
Admitting md with pt, attempted report

## 2016-06-18 NOTE — Progress Notes (Signed)
Pharmacy Antibiotic Note  Jeffrey [name] is a 50 y.o. male admitted on 06/17/2016 with L toe infection and osteomyelitis, with planned amputation.   Pharmacy has been consulted for Vancomycin/Zosyn dosing. Big toe amputation 2 months ago. WBC WNL. Afebrile. Renal function stable 0.6. Plan: -Vancomycin 750 mg IV q8h (based on levels at previous admission) Zosyn 3.375gm IV q8h EI -Trend WBC, temp, renal function -Drug levels as indicated   Temp (24hrs), Avg:98.9 F (37.2 C), Min:98.6 F (37 C), Max:99.5 F (37.5 C)   Recent Labs Lab 06/17/16 1829 06/18/16 0534  WBC 6.4 5.5  CREATININE 0.89 0.62    Estimated Creatinine Clearance: 107.4 mL/min (by C-G formula based on Cr of 0.62).    No Known Allergies  Bonnita Nasuti Pharm.D. CPP, BCPS Clinical Pharmacist [phone] 06/18/2016 12:06 PM

## 2016-06-18 NOTE — Consult Note (Signed)
Reason for Consult:right 2nd  toe ,Grade 4 Wagner ulcer with osteomyelitis 2nd toe Referring Physician: Doyle Askew MD  Jeffrey [name] is an 50 y.o. male.  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 Several days Hx of increased drainage foul smell right 2nd toe with swelling.   Past Medical History  Diagnosis Date  . Diabetes mellitus   . HTN (hypertension)   . Diabetic foot infection ([place]) 03/2016    RT FOOT    Past Surgical History  Procedure Laterality Date  . Back surgery      for abscess  . Amputation Right 04/01/2016    Procedure: Right Great Toe Amputation;  Surgeon: Newt Minion, MD;  Location: [place];  Service: Orthopedics;  Laterality: Right;    Family History  Problem Relation Age of Onset  . Diabetes Mother   . Hypertension Mother   . Diabetes Sister   . Diabetes Father   . Heart attack Father     Social History:  reports that he has never smoked. He has never used smokeless tobacco. He reports that he does not drink alcohol or use illicit drugs.  Allergies: No Known Allergies  Medications: I have reviewed the patient's current medications.  Results for orders placed or performed during the hospital encounter of 06/17/16 (from the past 48 hour(s))  Comprehensive metabolic panel     Status: Abnormal   Collection Time: 06/17/16  6:29 PM  Result Value Ref Range   Sodium 130 (L) 135 - 145 mmol/L   Potassium 3.4 (L) 3.5 - 5.1 mmol/L   Chloride 100 (L) 101 - 111 mmol/L   CO2 22 22 - 32 mmol/L   Glucose, Bld 477 (H) 65 - 99 mg/dL   BUN <5 (L) 6 - 20 mg/dL   Creatinine, Ser 0.89 0.61 - 1.24 mg/dL   Calcium 8.9 8.9 - 10.3 mg/dL   Total Protein 7.4 6.5 - 8.1 g/dL   Albumin 2.9 (L) 3.5 - 5.0 g/dL   AST 12 (L) 15 - 41 U/L   ALT 9 (L) 17 - 63 U/L   Alkaline Phosphatase 95 38 - 126 U/L   Total Bilirubin 0.3 0.3 - 1.2 mg/dL   GFR calc non Af Amer >60 >60 mL/min   GFR calc Af Amer >60 >60 mL/min    Comment: (NOTE) The eGFR has been  calculated using the CKD EPI equation. This calculation has not been validated in all clinical situations. eGFR's persistently <60 mL/min signify possible Chronic Kidney Disease.    Anion gap 8 5 - 15  CBC     Status: Abnormal   Collection Time: 06/17/16  6:29 PM  Result Value Ref Range   WBC 6.4 4.0 - 10.5 K/uL   RBC 5.08 4.22 - 5.81 MIL/uL   Hemoglobin 11.6 (L) 13.0 - 17.0 g/dL   HCT 37.5 (L) 39.0 - 52.0 %   MCV 73.8 (L) 78.0 - 100.0 fL   MCH 22.8 (L) 26.0 - 34.0 pg   MCHC 30.9 30.0 - 36.0 g/dL   RDW 15.7 (H) 11.5 - 15.5 %   Platelets 348 150 - 400 K/uL  POC CBG, ED     Status: Abnormal   Collection Time: 06/17/16  6:31 PM  Result Value Ref Range   Glucose-Capillary 461 (H) 65 - 99 mg/dL  Sedimentation rate     Status: Abnormal   Collection Time: 06/17/16 11:00 PM  Result Value Ref Range   Sed Rate  67 (H) 0 - 16 mm/hr  C-reactive protein     Status: Abnormal   Collection Time: 06/17/16 11:00 PM  Result Value Ref Range   CRP 6.8 (H) <1.0 mg/dL  Prealbumin     Status: Abnormal   Collection Time: 06/17/16 11:00 PM  Result Value Ref Range   Prealbumin 16.0 (L) 18 - 38 mg/dL  Blood Cultures x 2 sites     Status: None (Preliminary result)   Collection Time: 06/17/16 11:05 PM  Result Value Ref Range   Specimen Description BLOOD RIGHT HAND    Special Requests IN PEDIATRIC BOTTLE 4ML    Culture NO GROWTH < 12 HOURS    Report Status PENDING   Blood Cultures x 2 sites     Status: None (Preliminary result)   Collection Time: 06/17/16 11:20 PM  Result Value Ref Range   Specimen Description BLOOD LEFT ARM    Special Requests BOTTLES DRAWN AEROBIC AND ANAEROBIC 5ML    Culture NO GROWTH < 12 HOURS    Report Status PENDING   POC CBG, ED     Status: Abnormal   Collection Time: 06/18/16  1:30 AM  Result Value Ref Range   Glucose-Capillary 239 (H) 65 - 99 mg/dL  Glucose, capillary     Status: Abnormal   Collection Time: 06/18/16  2:10 AM  Result Value Ref Range    Glucose-Capillary 215 (H) 65 - 99 mg/dL  Urinalysis, Routine w reflex microscopic (not at [facility])     Status: Abnormal   Collection Time: 06/18/16  3:31 AM  Result Value Ref Range   Color, Urine YELLOW YELLOW   APPearance CLEAR CLEAR   Specific Gravity, Urine 1.026 1.005 - 1.030   pH 6.5 5.0 - 8.0   Glucose, UA >1000 (A) NEGATIVE mg/dL   Hgb urine dipstick SMALL (A) NEGATIVE   Bilirubin Urine NEGATIVE NEGATIVE   Ketones, ur NEGATIVE NEGATIVE mg/dL   Protein, ur 100 (A) NEGATIVE mg/dL   Nitrite NEGATIVE NEGATIVE   Leukocytes, UA NEGATIVE NEGATIVE  Urine microscopic-add on     Status: Abnormal   Collection Time: 06/18/16  3:31 AM  Result Value Ref Range   Squamous Epithelial / LPF 0-5 (A) NONE SEEN   WBC, UA 0-5 0 - 5 WBC/hpf   RBC / HPF 0-5 0 - 5 RBC/hpf   Bacteria, UA RARE (A) NONE SEEN   Casts HYALINE CASTS (A) NEGATIVE  Basic metabolic panel     Status: Abnormal   Collection Time: 06/18/16  5:34 AM  Result Value Ref Range   Sodium 135 135 - 145 mmol/L   Potassium 3.0 (L) 3.5 - 5.1 mmol/L   Chloride 105 101 - 111 mmol/L   CO2 24 22 - 32 mmol/L   Glucose, Bld 186 (H) 65 - 99 mg/dL   BUN <5 (L) 6 - 20 mg/dL   Creatinine, Ser 0.62 0.61 - 1.24 mg/dL   Calcium 8.6 (L) 8.9 - 10.3 mg/dL   GFR calc non Af Amer >60 >60 mL/min   GFR calc Af Amer >60 >60 mL/min    Comment: (NOTE) The eGFR has been calculated using the CKD EPI equation. This calculation has not been validated in all clinical situations. eGFR's persistently <60 mL/min signify possible Chronic Kidney Disease.    Anion gap 6 5 - 15  CBC     Status: Abnormal   Collection Time: 06/18/16  5:34 AM  Result Value Ref Range   WBC 5.5 4.0 - 10.5 K/uL  RBC 4.71 4.22 - 5.81 MIL/uL   Hemoglobin 10.7 (L) 13.0 - 17.0 g/dL   HCT 34.8 (L) 39.0 - 52.0 %   MCV 73.9 (L) 78.0 - 100.0 fL   MCH 22.7 (L) 26.0 - 34.0 pg   MCHC 30.7 30.0 - 36.0 g/dL   RDW 15.7 (H) 11.5 - 15.5 %   Platelets 338 150 - 400 K/uL  Magnesium     Status:  Abnormal   Collection Time: 06/18/16  5:34 AM  Result Value Ref Range   Magnesium 1.6 (L) 1.7 - 2.4 mg/dL  Ferritin     Status: None   Collection Time: 06/18/16  5:34 AM  Result Value Ref Range   Ferritin 74 24 - 336 ng/mL  Iron and TIBC     Status: Abnormal   Collection Time: 06/18/16  5:34 AM  Result Value Ref Range   Iron 22 (L) 45 - 182 ug/dL   TIBC 234 (L) 250 - 450 ug/dL   Saturation Ratios 9 (L) 17.9 - 39.5 %   UIBC 212 ug/dL  Glucose, capillary     Status: Abnormal   Collection Time: 06/18/16  7:38 AM  Result Value Ref Range   Glucose-Capillary 135 (H) 65 - 99 mg/dL    Mri Right Foot Without Contrast  06/18/2016  CLINICAL DATA:  Pain, swelling and inflammation of the forefoot. History of prior great toe amputation. Diabetic. EXAM: MRI OF THE RIGHT FOREFOOT WITHOUT CONTRAST TECHNIQUE: Multiplanar, multisequence MR imaging was performed. No intravenous contrast was administered. COMPARISON:  Radiographs 06/17/2016 and prior MRI foot 02/19/2016 FINDINGS: There is diffuse subcutaneous soft tissue swelling/ edema/ fluid consistent with severe cellulitis. There is also diffuse myositis. Surgical changes from a prior great toe amputation. Diffuse signal abnormality in the second toe consistent with osteomyelitis involving the proximal middle and distal phalanges. There is an open wound at the tip of the second toe and severe surrounding cellulitis. Edema like signal abnormalities also noted in the first metatarsal head. This is suspicious for osteomyelitis. Signal abnormality and the medial and lateral sesamoids of the great toe also suspicious for osteomyelitis. The flexor hallucis longus tendon is ruptured. Midfoot degenerative changes with probable degenerative edema involving the base of the second metatarsal, medial and middle cuneiforms. IMPRESSION: Severe cellulitis and myofasciitis. Open wound involving the tip of the second toe but no discrete drainable soft tissue abscess. MR findings  consistent with osteomyelitis involving the second toe (proximal middle and distal phalanges) and first metatarsal head and sesamoids. Moderate midfoot degenerative changes. Electronically Signed   By: Marijo Sanes M.D.   On: 06/18/2016 08:25   Ap / Lateral X-ray Right Foot  06/18/2016  CLINICAL DATA:  Infection.  Right foot pain. EXAM: RIGHT FOOT - 2 VIEW COMPARISON:  Radiographs 03/30/2016 FINDINGS: Post resection of the great toe. Soft tissue prominence at the resection bed without evident ulceration. Soft tissue air about the distal aspect of the second toe with second toe edema. Probable erosion about the second toe distal phalanx. Hammertoe deformity of the digits. There is diffuse dorsal soft tissue edema. Degenerative change in the midfoot. Vascular calcifications are seen. IMPRESSION: Findings suspicious for osteomyelitis of the second toe distal phalanx with adjacent soft tissue air, probable gangrene. Post resection of the great toe. Electronically Signed   By: Jeb Levering M.D.   On: 06/18/2016 00:24    Review of Systems  Constitutional: Positive for fever and malaise/fatigue. Negative for chills.  HENT: Negative for ear discharge.  Eyes: Negative for blurred vision.  Cardiovascular: Negative for chest pain and claudication.  Gastrointestinal: Negative for diarrhea.  Neurological: Positive for sensory change.   Blood pressure 132/90, pulse 58, temperature 98.9 F (37.2 C), temperature source Oral, resp. rate 18, height 5' 5" (1.651 m), weight 81.4 kg (179 lb 7.3 oz), SpO2 98 %. Physical Exam  Constitutional: He is oriented to person, place, and time. He appears well-developed and well-nourished.  HENT:  Head: Normocephalic.  Eyes: Pupils are equal, round, and reactive to light.  Neck: Normal range of motion.  Cardiovascular: Normal rate.   Respiratory: Effort normal.  GI: Soft. Bowel sounds are normal.  Musculoskeletal:  Left foot no ulcers, plantar foot looks good. Right  2nd toe drainage with ulcer, 3rd toe with callus at tip  Neurological: He is alert and oriented to person, place, and time.  Skin: There is erythema.  Psychiatric: He has a normal mood and affect.    Assessment/Plan: Noncompliant IDDM, he does not know what kind of insulin he takes at home, not sure what his A1C is. He will need 2nd toe amp and will end up with BKA unless his compliance gets better. I discussed this with him at length. Has some midfoot edema likely headed for [place] midfoot breakdown unless he does better. He understands he needs 2nd toe amputated and agrees to proceed. Will post for Sunday AM.  Will need 48 hrs IV post op then home on 5 days po ABX and followup with me in 2 wks.   Thanks [phone]  YATES,MARK C 06/18/2016, 10:34 AM

## 2016-06-18 NOTE — Progress Notes (Signed)
Pt seen and examined at bedside, admitted after midnight. Please see earlier admission note by Dr. Loleta Books. Pt admitted for evaluation of ? Right foot toes osteomyelitis. Started on vanc and zosyn, ortho consulted as pt will need 2nd toe amp, plan for Sunday AM. Appreciate ortho team input. BMP and CBC in AM, NPO after midnight.   Jeffrey Ramsay, MD  [facility] Pager [phone]  If 7PM-7AM, please contact night-coverage [email or web address] Password [email or web address]

## 2016-06-18 NOTE — Progress Notes (Signed)
Admission note:  Arrival Method: Pt arrived on stretcher from ED Mental Orientation: Alert and oriented x 4 Telemetry: N/A Assessment: Completed, see flowsheets Skin: Diabetic ulcer to second toe on the right foot. See flowsheets for full assessment. Healed amputated to right great toe. Skin assessed with Amado Coe, RN.  IV: Left hand Pain: Pt states pain is 7/10 in his right foot. He is refusing pain medication at this time. Tubes: N/A Safety Measures: Bed in lowest position, non-slip socks placed, call light within reach, and bed alarm activated  Fall Prevention Safety Plan: Reviewed with patient Admission Screening: Completed 6700 Orientation: Patient has been oriented to the unit, staff and to the room. Orders have been reviewed and implemented. Call light within reach, will continue to monitor the patient closely.  Shelbie Hutching, RN, BSN

## 2016-06-18 NOTE — Progress Notes (Signed)
Dr Lorin Mercy called and made aware pt positive for staph, not positive for MRSA

## 2016-06-18 NOTE — ED Notes (Signed)
Pt to MRI at this time.

## 2016-06-18 NOTE — Progress Notes (Signed)
Pt offered pain medicines, pt refusing.

## 2016-06-18 NOTE — Progress Notes (Signed)
Inpatient Diabetes Program Recommendations  AACE/ADA: New Consensus Statement on Inpatient Glycemic Control (2015)  Target Ranges:  Prepandial:   less than 140 mg/dL      Peak postprandial:   less than 180 mg/dL (1-2 hours)      Critically ill patients:  140 - 180 mg/dL   Lab Results  Component Value Date   GLUCAP 89 06/18/2016   HGBA1C 13.7 02/24/2016  Results for EZELLE, DELASHMUTT (MRN [id number]) as of 06/18/2016 12:34  Ref. Range 06/17/2016 18:31 06/18/2016 01:30 06/18/2016 02:10 06/18/2016 07:38 06/18/2016 11:42  Glucose-Capillary Latest Ref Range: 65-99 mg/dL 461 (H) 239 (H) 215 (H) 135 (H) 89    Review of Glycemic Control  Diabetes history: DM2 Outpatient Diabetes medications: Lantus 66 units QHS, Novolog 0-15 tidwc Current orders for Inpatient glycemic control: Lantus 66 units QHS, Novolog 0-20 units tidwc  HgbA1C indicates poor glycemic control at home. Needs diabetes education - both in-patient and outpatient, and close follow-up with PCP for management of DM  Inpatient Diabetes Program Recommendations:    Will likely need meal coverage insulin if post-prandial blood sugars > 180 mg/dL. Begin with Novolog 6 units tidwc. RN to continue with daily education regarding monitoring blood sugars, insulin injection technique, and basic survival skills. Will order Living Well book and videos on pt ed channel.  Continue to follow. Thank you. Lorenda Peck, RD, LDN, CDE Inpatient Diabetes Coordinator [phone]

## 2016-06-18 NOTE — H&P (Signed)
History and Physical  Patient Name: Jeffrey [name]     [id number]    DOB: 09/20/1966    DOA: 06/17/2016 PCP: Arnoldo Morale, MD   Patient coming from: Home  Chief Complaint: Foot pain  HPI: Jeffrey [name] is a 50 y.o. male with a past medical history significant for IDDM, poorly controlled, HTN, recent right great toe amputation for gangrene in April who presents with foot pain and swelling.  The patient underwent amputation of the RIGHT great toe on 4/21 and discharge shortly after. At PCP follow-up one week later, he was started on Keflex for redness of the second toe, which she completed. Patient and his fiance then report that things were progressing well for about a month. To her 3 weeks ago, he was cleared by orthopedics to return to work. However roughly the same time he started to have increased pain in that left foot and swelling of the second and third toes.   Now in the last few days, he reports worsening pain in the whole foot, swelling of the foot, and darkening of the 2nd and 3rd digits of the RIGHT foot, as well as hyperglycemia and vomiting. In addition his fiance notices that he has recurrent folliculitis worsening, and so they came to the ER tonight.  ED course: -Low-grade temperature 99.5 F, tachycardic, but BP normal and mentating well -Na 130, K 3.4, Cr 0.9 (baseline ), WBC 6.4 K, Hgb 11 and microcytic -ESR and CRP were mildly elevated -Radiograph of the right foot showed findings consistent with osteomyelitis of the distal phalanx of the second toe -He was given vancomycin, ceftriaxone, and Flagyl for diabetic foot infection.  -Insulin was given for hyperglycemia.  -TRH were asked to evaluate for admission     ROS: Pt complains of foot pain, swelling, and hyperglycemia, vomiting, folliculitis.  Pt denies any fever, chills, syncope, confusion, dizziness.    All other systems negative except as just noted or noted in the history of present  illness.    Past Medical History  Diagnosis Date  . Diabetes mellitus   . HTN (hypertension)   . Diabetic foot infection ([place]) 03/2016    RT FOOT    Past Surgical History  Procedure Laterality Date  . Back surgery      for abscess  . Amputation Right 04/01/2016    Procedure: Right Great Toe Amputation;  Surgeon: Newt Minion, MD;  Location: [facility];  Service: Orthopedics;  Laterality: Right;    Social History: Patient lives with his fiancee.  The patient walks unassisted.  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  He can't work now.  Does not smoke or dirnk.  No Known Allergies  Family history: His father had diabetes, peripheral neuropathy, gangrene, bilateral BKA, and died from myocardial infarction at age 31.  Prior to Admission medications   Medication Sig Start Date End Date Taking? Authorizing Provider  albuterol (PROVENTIL HFA;VENTOLIN HFA) 108 (90 Base) MCG/ACT inhaler Inhale 2 puffs into the lungs every 4 (four) hours as needed for wheezing or shortness of breath. 05/09/16  Yes Orpah Greek, MD  butalbital-acetaminophen-caffeine (ESGIC) 50-325-40 MG tablet Take 1 tablet by mouth every 6 (six) hours as needed for headache. 04/13/16  Yes Arnoldo Morale, MD  ferrous sulfate 325 (65 FE) MG tablet Take 1 tablet (325 mg total) by mouth 2 (two) times daily with a meal. 04/03/16  Yes Nishant Dhungel, MD  gabapentin (NEURONTIN) 400 MG capsule Take 1 capsule (  400 mg total) by mouth 3 (three) times daily. 03/30/16  Yes Arnoldo Morale, MD  insulin aspart (NOVOLOG) 100 UNIT/ML injection Inject 0-15 Units into the skin 3 (three) times daily with meals. Sliding scale  CBG 70 - 120: 0 units: CBG 121 - 140: 2 units; CBG 140 - 200: 4 units; CBG 201 - 240: 6 units; CBG 240 - 300: 8 units;CBG 300 - 350: 12 units; CBG 351 - 400: 16 units; CBG > 400 : 16 units and notify your  MD 04/03/16  Yes Nishant Dhungel, MD  Insulin Glargine (LANTUS SOLOSTAR) 100 UNIT/ML Solostar Pen Inject 60  Units into the skin daily at 10 pm. Patient taking differently: Inject 66 Units into the skin daily at 10 pm.  04/03/16  Yes Nishant Dhungel, MD  lactulose (CHRONULAC) 10 GM/15ML solution Take 15 mLs (10 g total) by mouth 2 (two) times daily as needed for mild constipation. 02/24/16  Yes Arnoldo Morale, MD  lisinopril (PRINIVIL,ZESTRIL) 20 MG tablet Take 1 tablet (20 mg total) by mouth daily. 04/13/16  Yes Arnoldo Morale, MD       Physical Exam: BP 152/103 mmHg  Pulse 88  Temp(Src) 99.5 F (37.5 C) (Oral)  Resp 23  SpO2 94% General appearance: Well-developed, adult male, alert and in no acute distress.   Eyes: Conjunctiva normal, lids and lashes normal.   PERRL.  ENT: No nasal deformity, discharge.  OP moist without lesions.  poor dentition.    Lymph: No cervical or supraclavicular lymphadenopathy. Skin: Warm and dry.  Folliculitis on chest/trunk. Cardiac: RRR on my exam, nl S1-S2, no murmurs appreciated.  Capillary refill is brisk.  JVP normal.  No LE edema.  Radial and DP pulses 2+ and symmetric. Respiratory: Normal respiratory rate and rhythm.  CTAB without rales or wheezes. GI: Abdomen soft without rigidity.  No TTP. No ascites, distension, hepatosplenomegaly.   MSK: No deformities or effusions.  No clubbing/cyanosis.  The right great toe is amputated.  The second RIGHT foot digit is swollen with a distal ulcer, mild drainage.  The third digit is likewise swollen.  The whole foot is mildly red and swollen, no focal tendernesss.    Neuro: Cranial nerves normal.  Sensorium intact and responding to questions, attention normal.  Speech is fluent.  Moves all extremities equally and with normal coordination.    Psych: Affect blunted and flat.  Judgment and insight appear normal.       Labs on Admission:  I have personally reviewed following labs and imaging studies: CBC:  Recent Labs Lab 06/17/16 1829  WBC 6.4  HGB 11.6*  HCT 37.5*  MCV 73.8*  PLT 415   Basic Metabolic  Panel:  Recent Labs Lab 06/17/16 1829  NA 130*  K 3.4*  CL 100*  CO2 22  GLUCOSE 477*  BUN <5*  CREATININE 0.89  CALCIUM 8.9   GFR: CrCl cannot be calculated (Unknown ideal weight.).  Liver Function Tests:  Recent Labs Lab 06/17/16 1829  AST 12*  ALT 9*  ALKPHOS 95  BILITOT 0.3  PROT 7.4  ALBUMIN 2.9*   CBG:  Recent Labs Lab 06/17/16 1831 06/18/16 0130  GLUCAP 461* 239*        Radiological Exams on Admission: Personally reviewed: Ap / Lateral X-ray Right Foot  06/18/2016  CLINICAL DATA:  Infection.  Right foot pain. EXAM: RIGHT FOOT - 2 VIEW COMPARISON:  Radiographs 03/30/2016 FINDINGS: Post resection of the great toe. Soft tissue prominence at the resection bed without evident ulceration.  Soft tissue air about the distal aspect of the second toe with second toe edema. Probable erosion about the second toe distal phalanx. Hammertoe deformity of the digits. There is diffuse dorsal soft tissue edema. Degenerative change in the midfoot. Vascular calcifications are seen. IMPRESSION: Findings suspicious for osteomyelitis of the second toe distal phalanx with adjacent soft tissue air, probable gangrene. Post resection of the great toe. Electronically Signed   By: Jeb Levering M.D.   On: 06/18/2016 00:24        Assessment/Plan 1. Diabetic foot infection:  -I will hold further antibiotics now as the patient does not meet SIRS criteria, and defer to my partners in conjunction with orthopedics and infectious disease regarding possibility of cultures and medical therapy -Consult to orthopedics -Consult to case management and diabetes coordinator -Follow HIV and hemoglobin A1c  -Follow blood cultures -ABIs ordered by ER   2. Hypokalemia:  -Check magnesium -Supplement orally  3. Hyponatremia:  Likely from hypovolemia -Trend BMP  4. Chronic iron deficiency anemia:  -Check ferritin and iron studies  5. Type 2 diabetes, IDDM with neuropathy and gangrene:   -Continue Lantus 66 units nightly -Resistant SSI -Diabetes educator consulted -Continue gabapentin  6. HTN:  -Continue home lisinopril  7. Folliculitis:       DVT prophylaxis: Lovenox  Code Status: Full  Family Communication: Sister and fiance present at the bedside.  Disposition Plan: Anticipate consultation with orthopedics, then medical therapy versus amputation versus debridement. Consults called: None overnight Admission status: Inpatient, MedSurg   Medical decision making: Patient seen at 1:48 AM on 06/18/2016.  The patient was discussed with Vicente Males Muthersbaugh PA-C. What exists of the patient's chart was reviewed in depth.  Clinical condition: Stable.        Edwin Dada [facility] Pager [phone]

## 2016-06-19 ENCOUNTER — Inpatient Hospital Stay (HOSPITAL_COMMUNITY): Payer: Self-pay | Admitting: Certified Registered Nurse Anesthetist

## 2016-06-19 ENCOUNTER — Encounter (HOSPITAL_COMMUNITY): Payer: Self-pay | Admitting: Certified Registered Nurse Anesthetist

## 2016-06-19 ENCOUNTER — Encounter (HOSPITAL_COMMUNITY)
Admission: EM | Disposition: A | Discharge status: Home / Self Care | Payer: Self-pay | Source: Home / Self Care | Attending: Internal Medicine

## 2016-06-19 HISTORY — PX: AMPUTATION: SHX166

## 2016-06-19 LAB — BASIC METABOLIC PANEL
Anion gap: 5 (ref 5–15)
BUN: 7 mg/dL (ref 6–20)
CALCIUM: 8.6 mg/dL — AB (ref 8.9–10.3)
CHLORIDE: 107 mmol/L (ref 101–111)
CO2: 25 mmol/L (ref 22–32)
CREATININE: 0.88 mg/dL (ref 0.61–1.24)
GFR calc Af Amer: >60 mL/min (ref >60)
GFR calc non Af Amer: >60 mL/min (ref >60)
GLUCOSE: 90 mg/dL (ref 65–99)
Potassium: 3.3 mmol/L — ABNORMAL LOW (ref 3.5–5.1)
Sodium: 137 mmol/L (ref 135–145)

## 2016-06-19 LAB — CBC
HEMATOCRIT: 34.7 % — AB (ref 39.0–52.0)
Hemoglobin: 10.6 g/dL — ABNORMAL LOW (ref 13.0–17.0)
MCH: 22.8 pg — ABNORMAL LOW (ref 26.0–34.0)
MCHC: 30.5 g/dL (ref 30.0–36.0)
MCV: 74.8 fL — AB (ref 78.0–100.0)
Platelets: 304 10*3/uL (ref 150–400)
RBC: 4.64 MIL/uL (ref 4.22–5.81)
RDW: 16.2 % — AB (ref 11.5–15.5)
WBC: 5.3 10*3/uL (ref 4.0–10.5)

## 2016-06-19 LAB — GLUCOSE, CAPILLARY
GLUCOSE-CAPILLARY: 73 mg/dL (ref 65–99)
GLUCOSE-CAPILLARY: 68 mg/dL (ref 65–99)
GLUCOSE-CAPILLARY: 127 mg/dL — AB (ref 65–99)
GLUCOSE-CAPILLARY: 243 mg/dL — AB (ref 65–99)
Glucose-Capillary: 99 mg/dL (ref 65–99)
Glucose-Capillary: 165 mg/dL — ABNORMAL HIGH (ref 65–99)

## 2016-06-19 SURGERY — AMPUTATION DIGIT
Anesthesia: General | Site: Foot | Laterality: Right

## 2016-06-19 MED ORDER — MIDAZOLAM HCL 2 MG/2ML IJ SOLN
INTRAMUSCULAR | Status: DC | PRN
  Administered 2016-06-19: 2 mg via INTRAVENOUS

## 2016-06-19 MED ORDER — FENTANYL CITRATE (PF) 250 MCG/5ML IJ SOLN
INTRAMUSCULAR | Status: DC | PRN
  Administered 2016-06-19 (×2): 25 ug via INTRAVENOUS

## 2016-06-19 MED ORDER — ROCURONIUM BROMIDE 50 MG/5ML IV SOLN
INTRAVENOUS | Status: AC
  Filled 2016-06-19: qty 1

## 2016-06-19 MED ORDER — PHENYLEPHRINE HCL 10 MG/ML IJ SOLN
INTRAMUSCULAR | Status: DC | PRN
  Administered 2016-06-19: 80 ug via INTRAVENOUS

## 2016-06-19 MED ORDER — ONDANSETRON HCL 4 MG/2ML IJ SOLN
INTRAMUSCULAR | Status: DC | PRN
  Administered 2016-06-19: 4 mg via INTRAVENOUS

## 2016-06-19 MED ORDER — FENTANYL CITRATE (PF) 100 MCG/2ML IJ SOLN: 25.0000 ug | INTRAMUSCULAR | Status: DC | PRN

## 2016-06-19 MED ORDER — PHENYLEPHRINE 40 MCG/ML (10ML) SYRINGE FOR IV PUSH (FOR BLOOD PRESSURE SUPPORT)
PREFILLED_SYRINGE | INTRAVENOUS | Status: AC
  Filled 2016-06-19: qty 10

## 2016-06-19 MED ORDER — MIDAZOLAM HCL 2 MG/2ML IJ SOLN
INTRAMUSCULAR | Status: AC
  Filled 2016-06-19: qty 2

## 2016-06-19 MED ORDER — OXYCODONE HCL 5 MG PO TABS: 5.0000 mg | ORAL_TABLET | Freq: Once | ORAL | Status: DC | PRN

## 2016-06-19 MED ORDER — FENTANYL CITRATE (PF) 250 MCG/5ML IJ SOLN
INTRAMUSCULAR | Status: AC
  Filled 2016-06-19: qty 5

## 2016-06-19 MED ORDER — OXYCODONE HCL 5 MG/5ML PO SOLN
5.0000 mg | Freq: Once | ORAL | Status: DC | PRN
Start: 2016-06-19 — End: 2016-06-19

## 2016-06-19 MED ORDER — PROPOFOL 10 MG/ML IV BOLUS
INTRAVENOUS | Status: DC | PRN
  Administered 2016-06-19: 180 mg via INTRAVENOUS

## 2016-06-19 MED ORDER — LACTATED RINGERS IV SOLN
INTRAVENOUS | Status: DC | PRN
  Administered 2016-06-19: 07:00:00 via INTRAVENOUS

## 2016-06-19 MED ORDER — MAGNESIUM SULFATE 2 GM/50ML IV SOLN
2.0000 g | Freq: Once | INTRAVENOUS | Status: AC
  Administered 2016-06-19: 2 g via INTRAVENOUS
  Filled 2016-06-19: qty 50

## 2016-06-19 MED ORDER — ONDANSETRON HCL 4 MG/2ML IJ SOLN
INTRAMUSCULAR | Status: AC
  Filled 2016-06-19: qty 2

## 2016-06-19 MED ORDER — ONDANSETRON HCL 4 MG/2ML IJ SOLN: 4.0000 mg | Freq: Four times a day (QID) | INTRAMUSCULAR | Status: DC | PRN

## 2016-06-19 MED ORDER — LIDOCAINE HCL (CARDIAC) 20 MG/ML IV SOLN
INTRAVENOUS | Status: DC | PRN
  Administered 2016-06-19: 60 mg via INTRAVENOUS

## 2016-06-19 MED ORDER — PROPOFOL 10 MG/ML IV BOLUS
INTRAVENOUS | Status: AC
  Filled 2016-06-19: qty 20

## 2016-06-19 MED ORDER — 0.9 % SODIUM CHLORIDE (POUR BTL) OPTIME
TOPICAL | Status: DC | PRN
  Administered 2016-06-19: 1000 mL

## 2016-06-19 SURGICAL SUPPLY — 53 items
APL SKNCLS STERI-STRIP NONHPOA (GAUZE/BANDAGES/DRESSINGS)
BANDAGE ELASTIC 4 VELCRO ST LF (GAUZE/BANDAGES/DRESSINGS) IMPLANT
BENZOIN TINCTURE PRP APPL 2/3 (GAUZE/BANDAGES/DRESSINGS) ×1 IMPLANT
BLADE SURG ROTATE 9660 (MISCELLANEOUS) IMPLANT
BNDG COHESIVE 1X5 TAN STRL LF (GAUZE/BANDAGES/DRESSINGS) IMPLANT
BNDG COHESIVE 4X5 TAN STRL (GAUZE/BANDAGES/DRESSINGS) ×2 IMPLANT
BNDG CONFORM 2 STRL LF (GAUZE/BANDAGES/DRESSINGS) IMPLANT
BNDG CONFORM 3 STRL LF (GAUZE/BANDAGES/DRESSINGS) IMPLANT
BNDG ELASTIC 2X5.8 VLCR STR LF (GAUZE/BANDAGES/DRESSINGS) IMPLANT
CLOSURE WOUND 1/2 X4 (GAUZE/BANDAGES/DRESSINGS) ×1
CORDS BIPOLAR (ELECTRODE) ×3 IMPLANT
COVER SURGICAL LIGHT HANDLE (MISCELLANEOUS) ×3 IMPLANT
CUFF TOURNIQUET SINGLE 18IN (TOURNIQUET CUFF) IMPLANT
CUFF TOURNIQUET SINGLE 24IN (TOURNIQUET CUFF) IMPLANT
DRSG EMULSION OIL 3X3 NADH (GAUZE/BANDAGES/DRESSINGS) IMPLANT
DURAPREP 26ML APPLICATOR (WOUND CARE) ×1 IMPLANT
ELECT REM PT RETURN 9FT ADLT (ELECTROSURGICAL)
ELECTRODE REM PT RTRN 9FT ADLT (ELECTROSURGICAL) IMPLANT
GAUZE SPONGE 2X2 8PLY STRL LF (GAUZE/BANDAGES/DRESSINGS) IMPLANT
GAUZE SPONGE 4X4 12PLY STRL (GAUZE/BANDAGES/DRESSINGS) ×2 IMPLANT
GAUZE XEROFORM 1X8 LF (GAUZE/BANDAGES/DRESSINGS) ×2 IMPLANT
GLOVE BIOGEL PI IND STRL 8 (GLOVE) ×1 IMPLANT
GLOVE BIOGEL PI INDICATOR 8 (GLOVE) ×2
GLOVE ORTHO TXT STRL SZ7.5 (GLOVE) ×3 IMPLANT
GOWN STRL REUS W/ TWL LRG LVL3 (GOWN DISPOSABLE) ×2 IMPLANT
GOWN STRL REUS W/TWL 2XL LVL3 (GOWN DISPOSABLE) IMPLANT
GOWN STRL REUS W/TWL LRG LVL3 (GOWN DISPOSABLE) ×6
KIT BASIN OR (CUSTOM PROCEDURE TRAY) ×3 IMPLANT
KIT ROOM TURNOVER OR (KITS) ×3 IMPLANT
MANIFOLD NEPTUNE II (INSTRUMENTS) ×1 IMPLANT
NS IRRIG 1000ML POUR BTL (IV SOLUTION) ×3 IMPLANT
PACK ORTHO EXTREMITY (CUSTOM PROCEDURE TRAY) ×3 IMPLANT
PAD ARMBOARD 7.5X6 YLW CONV (MISCELLANEOUS) ×6 IMPLANT
SPECIMEN JAR SMALL (MISCELLANEOUS) ×3 IMPLANT
SPONGE GAUZE 2X2 STER 10/PKG (GAUZE/BANDAGES/DRESSINGS)
STRIP CLOSURE SKIN 1/2X4 (GAUZE/BANDAGES/DRESSINGS) ×2 IMPLANT
SUCTION FRAZIER HANDLE 10FR (MISCELLANEOUS)
SUCTION TUBE FRAZIER 10FR DISP (MISCELLANEOUS) IMPLANT
SUT ETHIBOND 4 0 TF (SUTURE) IMPLANT
SUT ETHIBOND 5 0 P 3 (SUTURE)
SUT ETHILON 2 0 FS 18 (SUTURE) ×2 IMPLANT
SUT ETHILON 4 0 P 3 18 (SUTURE) IMPLANT
SUT ETHILON 5 0 P 3 18 (SUTURE)
SUT MERSILENE 6 0 S14 DA (SUTURE) IMPLANT
SUT NYLON ETHILON 5-0 P-3 1X18 (SUTURE) ×1 IMPLANT
SUT POLY ETHIBOND 5-0 P-3 1X18 (SUTURE) IMPLANT
SUT PROLENE 4 0 P 3 18 (SUTURE) IMPLANT
SUT SILK 4 0 PS 2 (SUTURE) IMPLANT
TOWEL OR 17X24 6PK STRL BLUE (TOWEL DISPOSABLE) ×3 IMPLANT
TOWEL OR 17X26 10 PK STRL BLUE (TOWEL DISPOSABLE) ×3 IMPLANT
TUBE CONNECTING 12'X1/4 (SUCTIONS)
TUBE CONNECTING 12X1/4 (SUCTIONS) IMPLANT
WATER STERILE IRR 1000ML POUR (IV SOLUTION) ×3 IMPLANT

## 2016-06-19 NOTE — Progress Notes (Signed)
Orthopedic Tech Progress Note Patient Details:  [name] 1966-05-21 [id number]  Ortho Devices Type of Ortho Device: Postop shoe/boot Ortho Device/Splint Location: rle Ortho Device/Splint Interventions: Application   Jeffrey [name] 06/19/2016, 10:31 AM

## 2016-06-19 NOTE — Anesthesia Preprocedure Evaluation (Signed)
Anesthesia Evaluation  Patient identified by MRN, date of birth, ID band Patient awake    Reviewed: Allergy & Precautions, H&P , NPO status , [name] Chart, lab work & pertinent test results  Airway Mallampati: II   Neck ROM: full    Dental   Pulmonary neg pulmonary ROS,    breath sounds clear to auscultation       Cardiovascular hypertension,  Rhythm:regular Rate:Normal     Neuro/Psych    GI/Hepatic   Endo/Other  diabetes, Type 2  Renal/GU      Musculoskeletal   Abdominal   Peds  Hematology   Anesthesia Other Findings   Reproductive/Obstetrics                             Anesthesia Physical Anesthesia Plan  ASA: II  Anesthesia Plan: General   Post-op Pain Management:    Induction: Intravenous  Airway Management Planned: LMA  Additional Equipment:   Intra-op Plan:   Post-operative Plan:   Informed Consent: I have reviewed the patients History and Physical, chart, labs and discussed the procedure including the risks, benefits and alternatives for the proposed anesthesia with the patient or authorized representative who has indicated his/her understanding and acceptance.     Plan Discussed with: CRNA, Anesthesiologist and Surgeon  Anesthesia Plan Comments:         Anesthesia Quick Evaluation

## 2016-06-19 NOTE — Anesthesia Procedure Notes (Signed)
Procedure Name: Intubation Date/Time: 06/19/2016 7:47 AM Performed by: Rejeana Brock L Pre-anesthesia Checklist: Patient identified, Emergency Drugs available, Suction available, Patient being monitored and Timeout performed Patient Re-evaluated:Patient Re-evaluated prior to inductionOxygen Delivery Method: Circle system utilized Preoxygenation: Pre-oxygenation with 100% oxygen Intubation Type: IV induction Ventilation: Mask ventilation without difficulty LMA: LMA inserted LMA Size: 4.0 Number of attempts: 1 Placement Confirmation: positive ETCO2 and breath sounds checked- equal and bilateral Tube secured with: Tape Dental Injury: Teeth and Oropharynx as per pre-operative assessment

## 2016-06-19 NOTE — Progress Notes (Signed)
Blood sugar 68 Dr Marcie Bal notified instructed to give apple juice and take pt to room for breakfast  Pt is awake and no nausea tolerated apple juice pt states he feels ok no shakiness skin warm and dry to touch

## 2016-06-19 NOTE — Anesthesia Postprocedure Evaluation (Signed)
Anesthesia Post Note  Patient: Jeffrey [name]  Procedure(s) Performed: Procedure(s) (LRB): AMPUTATION SECOND TOE (Right)  Patient location during evaluation: PACU Anesthesia Type: General Level of consciousness: awake and alert and patient cooperative Pain management: pain level controlled Vital Signs Assessment: post-procedure vital signs reviewed and stable Respiratory status: spontaneous breathing and respiratory function stable Cardiovascular status: stable Anesthetic complications: no    Last Vitals:  Filed Vitals:   06/18/16 1959 06/19/16 0502  BP: 138/87 119/93  Pulse: 92 55  Temp: 37.3 C 36.9 C  Resp:  18    Last Pain:  Filed Vitals:   06/19/16 0536  PainSc: [place]

## 2016-06-19 NOTE — Progress Notes (Signed)
Orthopedic Tech Progress Note Patient Details:  Jeffrey [name] 1966-06-10 [id number]  Patient ID: Jeffrey [name], male   DOB: 1966/05/03, 50 y.o.   MRN: [id number] One ortho tech visit to be deleted  Jeffrey [name] 06/19/2016, 10:32 AM

## 2016-06-19 NOTE — Progress Notes (Signed)
Patient ID: Jeffrey [name], male   DOB: January 15, 1966, 50 y.o.   MRN: [id number] 2nd toe amputation done, did not see any infection more proximal.  48 hrs IV ABX post op then may send home on PO ABX for one week. ( septra or cipro fine. )   I will see him in one week . Thanks [phone]

## 2016-06-19 NOTE — Transfer of Care (Signed)
Immediate Anesthesia Transfer of Care Note  Patient: Jeffrey [name]  Procedure(s) Performed: Procedure(s): AMPUTATION SECOND TOE (Right)  Patient Location: PACU  Anesthesia Type:General  Level of Consciousness: awake  Airway & Oxygen Therapy: Patient Spontanous Breathing and Patient connected to nasal cannula oxygen  Post-op Assessment: Report given to RN and Post -op Vital signs reviewed and stable  Post vital signs: Reviewed and stable  Last Vitals:  Filed Vitals:   06/18/16 1959 06/19/16 0502  BP: 138/87 119/93  Pulse: 92 55  Temp: 37.3 C 36.9 C  Resp:  18    Last Pain:  Filed Vitals:   06/19/16 0536  PainSc: 7          Complications: No apparent anesthesia complications

## 2016-06-19 NOTE — Brief Op Note (Signed)
06/17/2016 - 06/19/2016  8:23 AM  PATIENT:  Jeffrey [name]  50 y.o. 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  POST-OPERATIVE DIAGNOSIS:  DIABETIC FOOT INFECTION  PROCEDURE:  Procedure(s): AMPUTATION SECOND TOE (Right)  SURGEON:  Surgeon(s) and Role:    * Marybelle Killings, MD - Primary  PHYSICIAN ASSISTANT:   ASSISTANTS: none   ANESTHESIA:   general  EBL:  Total I/O In: -  Out: 2 [Blood:2]  BLOOD ADMINISTERED:none  DRAINS: none   LOCAL MEDICATIONS USED:  NONE  SPECIMEN:  No Specimen  DISPOSITION OF SPECIMEN:  N/A  COUNTS:  YES  TOURNIQUET:  * No tourniquets in log *  DICTATION: .Other Dictation: Dictation Number [id number]  PLAN OF CARE:  PATIENT DISPOSITION:  already inpt   Delay start of Pharmacological VTE agent (>24hrs) due to surgical blood loss or risk of bleeding: not applicable

## 2016-06-19 NOTE — H&P (View-Only) (Signed)
Reason for Consult:right 2nd  toe ,Grade 4 Wagner ulcer with osteomyelitis 2nd toe Referring Physician: Doyle Askew MD  Jeffrey [name] is an 50 y.o. male.  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 Several days Hx of increased drainage foul smell right 2nd toe with swelling.   Past Medical History  Diagnosis Date  . Diabetes mellitus   . HTN (hypertension)   . Diabetic foot infection ([place]) 03/2016    RT FOOT    Past Surgical History  Procedure Laterality Date  . Back surgery      for abscess  . Amputation Right 04/01/2016    Procedure: Right Great Toe Amputation;  Surgeon: Newt Minion, MD;  Location: [place];  Service: Orthopedics;  Laterality: Right;    Family History  Problem Relation Age of Onset  . Diabetes Mother   . Hypertension Mother   . Diabetes Sister   . Diabetes Father   . Heart attack Father     Social History:  reports that he has never smoked. He has never used smokeless tobacco. He reports that he does not drink alcohol or use illicit drugs.  Allergies: No Known Allergies  Medications: I have reviewed the patient's current medications.  Results for orders placed or performed during the hospital encounter of 06/17/16 (from the past 48 hour(s))  Comprehensive metabolic panel     Status: Abnormal   Collection Time: 06/17/16  6:29 PM  Result Value Ref Range   Sodium 130 (L) 135 - 145 mmol/L   Potassium 3.4 (L) 3.5 - 5.1 mmol/L   Chloride 100 (L) 101 - 111 mmol/L   CO2 22 22 - 32 mmol/L   Glucose, Bld 477 (H) 65 - 99 mg/dL   BUN <5 (L) 6 - 20 mg/dL   Creatinine, Ser 0.89 0.61 - 1.24 mg/dL   Calcium 8.9 8.9 - 10.3 mg/dL   Total Protein 7.4 6.5 - 8.1 g/dL   Albumin 2.9 (L) 3.5 - 5.0 g/dL   AST 12 (L) 15 - 41 U/L   ALT 9 (L) 17 - 63 U/L   Alkaline Phosphatase 95 38 - 126 U/L   Total Bilirubin 0.3 0.3 - 1.2 mg/dL   GFR calc non Af Amer >60 >60 mL/min   GFR calc Af Amer >60 >60 mL/min    Comment: (NOTE) The eGFR has been  calculated using the CKD EPI equation. This calculation has not been validated in all clinical situations. eGFR's persistently <60 mL/min signify possible Chronic Kidney Disease.    Anion gap 8 5 - 15  CBC     Status: Abnormal   Collection Time: 06/17/16  6:29 PM  Result Value Ref Range   WBC 6.4 4.0 - 10.5 K/uL   RBC 5.08 4.22 - 5.81 MIL/uL   Hemoglobin 11.6 (L) 13.0 - 17.0 g/dL   HCT 37.5 (L) 39.0 - 52.0 %   MCV 73.8 (L) 78.0 - 100.0 fL   MCH 22.8 (L) 26.0 - 34.0 pg   MCHC 30.9 30.0 - 36.0 g/dL   RDW 15.7 (H) 11.5 - 15.5 %   Platelets 348 150 - 400 K/uL  POC CBG, ED     Status: Abnormal   Collection Time: 06/17/16  6:31 PM  Result Value Ref Range   Glucose-Capillary 461 (H) 65 - 99 mg/dL  Sedimentation rate     Status: Abnormal   Collection Time: 06/17/16 11:00 PM  Result Value Ref Range   Sed Rate  67 (H) 0 - 16 mm/hr  C-reactive protein     Status: Abnormal   Collection Time: 06/17/16 11:00 PM  Result Value Ref Range   CRP 6.8 (H) <1.0 mg/dL  Prealbumin     Status: Abnormal   Collection Time: 06/17/16 11:00 PM  Result Value Ref Range   Prealbumin 16.0 (L) 18 - 38 mg/dL  Blood Cultures x 2 sites     Status: None (Preliminary result)   Collection Time: 06/17/16 11:05 PM  Result Value Ref Range   Specimen Description BLOOD RIGHT HAND    Special Requests IN PEDIATRIC BOTTLE 4ML    Culture NO GROWTH < 12 HOURS    Report Status PENDING   Blood Cultures x 2 sites     Status: None (Preliminary result)   Collection Time: 06/17/16 11:20 PM  Result Value Ref Range   Specimen Description BLOOD LEFT ARM    Special Requests BOTTLES DRAWN AEROBIC AND ANAEROBIC 5ML    Culture NO GROWTH < 12 HOURS    Report Status PENDING   POC CBG, ED     Status: Abnormal   Collection Time: 06/18/16  1:30 AM  Result Value Ref Range   Glucose-Capillary 239 (H) 65 - 99 mg/dL  Glucose, capillary     Status: Abnormal   Collection Time: 06/18/16  2:10 AM  Result Value Ref Range    Glucose-Capillary 215 (H) 65 - 99 mg/dL  Urinalysis, Routine w reflex microscopic (not at [facility])     Status: Abnormal   Collection Time: 06/18/16  3:31 AM  Result Value Ref Range   Color, Urine YELLOW YELLOW   APPearance CLEAR CLEAR   Specific Gravity, Urine 1.026 1.005 - 1.030   pH 6.5 5.0 - 8.0   Glucose, UA >1000 (A) NEGATIVE mg/dL   Hgb urine dipstick SMALL (A) NEGATIVE   Bilirubin Urine NEGATIVE NEGATIVE   Ketones, ur NEGATIVE NEGATIVE mg/dL   Protein, ur 100 (A) NEGATIVE mg/dL   Nitrite NEGATIVE NEGATIVE   Leukocytes, UA NEGATIVE NEGATIVE  Urine microscopic-add on     Status: Abnormal   Collection Time: 06/18/16  3:31 AM  Result Value Ref Range   Squamous Epithelial / LPF 0-5 (A) NONE SEEN   WBC, UA 0-5 0 - 5 WBC/hpf   RBC / HPF 0-5 0 - 5 RBC/hpf   Bacteria, UA RARE (A) NONE SEEN   Casts HYALINE CASTS (A) NEGATIVE  Basic metabolic panel     Status: Abnormal   Collection Time: 06/18/16  5:34 AM  Result Value Ref Range   Sodium 135 135 - 145 mmol/L   Potassium 3.0 (L) 3.5 - 5.1 mmol/L   Chloride 105 101 - 111 mmol/L   CO2 24 22 - 32 mmol/L   Glucose, Bld 186 (H) 65 - 99 mg/dL   BUN <5 (L) 6 - 20 mg/dL   Creatinine, Ser 0.62 0.61 - 1.24 mg/dL   Calcium 8.6 (L) 8.9 - 10.3 mg/dL   GFR calc non Af Amer >60 >60 mL/min   GFR calc Af Amer >60 >60 mL/min    Comment: (NOTE) The eGFR has been calculated using the CKD EPI equation. This calculation has not been validated in all clinical situations. eGFR's persistently <60 mL/min signify possible Chronic Kidney Disease.    Anion gap 6 5 - 15  CBC     Status: Abnormal   Collection Time: 06/18/16  5:34 AM  Result Value Ref Range   WBC 5.5 4.0 - 10.5 K/uL  RBC 4.71 4.22 - 5.81 MIL/uL   Hemoglobin 10.7 (L) 13.0 - 17.0 g/dL   HCT 34.8 (L) 39.0 - 52.0 %   MCV 73.9 (L) 78.0 - 100.0 fL   MCH 22.7 (L) 26.0 - 34.0 pg   MCHC 30.7 30.0 - 36.0 g/dL   RDW 15.7 (H) 11.5 - 15.5 %   Platelets 338 150 - 400 K/uL  Magnesium     Status:  Abnormal   Collection Time: 06/18/16  5:34 AM  Result Value Ref Range   Magnesium 1.6 (L) 1.7 - 2.4 mg/dL  Ferritin     Status: None   Collection Time: 06/18/16  5:34 AM  Result Value Ref Range   Ferritin 74 24 - 336 ng/mL  Iron and TIBC     Status: Abnormal   Collection Time: 06/18/16  5:34 AM  Result Value Ref Range   Iron 22 (L) 45 - 182 ug/dL   TIBC 234 (L) 250 - 450 ug/dL   Saturation Ratios 9 (L) 17.9 - 39.5 %   UIBC 212 ug/dL  Glucose, capillary     Status: Abnormal   Collection Time: 06/18/16  7:38 AM  Result Value Ref Range   Glucose-Capillary 135 (H) 65 - 99 mg/dL    Mri Right Foot Without Contrast  06/18/2016  CLINICAL DATA:  Pain, swelling and inflammation of the forefoot. History of prior great toe amputation. Diabetic. EXAM: MRI OF THE RIGHT FOREFOOT WITHOUT CONTRAST TECHNIQUE: Multiplanar, multisequence MR imaging was performed. No intravenous contrast was administered. COMPARISON:  Radiographs 06/17/2016 and prior MRI foot 02/19/2016 FINDINGS: There is diffuse subcutaneous soft tissue swelling/ edema/ fluid consistent with severe cellulitis. There is also diffuse myositis. Surgical changes from a prior great toe amputation. Diffuse signal abnormality in the second toe consistent with osteomyelitis involving the proximal middle and distal phalanges. There is an open wound at the tip of the second toe and severe surrounding cellulitis. Edema like signal abnormalities also noted in the first metatarsal head. This is suspicious for osteomyelitis. Signal abnormality and the medial and lateral sesamoids of the great toe also suspicious for osteomyelitis. The flexor hallucis longus tendon is ruptured. Midfoot degenerative changes with probable degenerative edema involving the base of the second metatarsal, medial and middle cuneiforms. IMPRESSION: Severe cellulitis and myofasciitis. Open wound involving the tip of the second toe but no discrete drainable soft tissue abscess. MR findings  consistent with osteomyelitis involving the second toe (proximal middle and distal phalanges) and first metatarsal head and sesamoids. Moderate midfoot degenerative changes. Electronically Signed   By: Marijo Sanes M.D.   On: 06/18/2016 08:25   Ap / Lateral X-ray Right Foot  06/18/2016  CLINICAL DATA:  Infection.  Right foot pain. EXAM: RIGHT FOOT - 2 VIEW COMPARISON:  Radiographs 03/30/2016 FINDINGS: Post resection of the great toe. Soft tissue prominence at the resection bed without evident ulceration. Soft tissue air about the distal aspect of the second toe with second toe edema. Probable erosion about the second toe distal phalanx. Hammertoe deformity of the digits. There is diffuse dorsal soft tissue edema. Degenerative change in the midfoot. Vascular calcifications are seen. IMPRESSION: Findings suspicious for osteomyelitis of the second toe distal phalanx with adjacent soft tissue air, probable gangrene. Post resection of the great toe. Electronically Signed   By: Jeb Levering M.D.   On: 06/18/2016 00:24    Review of Systems  Constitutional: Positive for fever and malaise/fatigue. Negative for chills.  HENT: Negative for ear discharge.  Eyes: Negative for blurred vision.  Cardiovascular: Negative for chest pain and claudication.  Gastrointestinal: Negative for diarrhea.  Neurological: Positive for sensory change.   Blood pressure 132/90, pulse 58, temperature 98.9 F (37.2 C), temperature source Oral, resp. rate 18, height 5' 5" (1.651 m), weight 81.4 kg (179 lb 7.3 oz), SpO2 98 %. Physical Exam  Constitutional: He is oriented to person, place, and time. He appears well-developed and well-nourished.  HENT:  Head: Normocephalic.  Eyes: Pupils are equal, round, and reactive to light.  Neck: Normal range of motion.  Cardiovascular: Normal rate.   Respiratory: Effort normal.  GI: Soft. Bowel sounds are normal.  Musculoskeletal:  Left foot no ulcers, plantar foot looks good. Right  2nd toe drainage with ulcer, 3rd toe with callus at tip  Neurological: He is alert and oriented to person, place, and time.  Skin: There is erythema.  Psychiatric: He has a normal mood and affect.    Assessment/Plan: Noncompliant IDDM, he does not know what kind of insulin he takes at home, not sure what his A1C is. He will need 2nd toe amp and will end up with BKA unless his compliance gets better. I discussed this with him at length. Has some midfoot edema likely headed for [place] midfoot breakdown unless he does better. He understands he needs 2nd toe amputated and agrees to proceed. Will post for Sunday AM.  Will need 48 hrs IV post op then home on 5 days po ABX and followup with me in 2 wks.   Thanks [phone]  Gergory Biello C 06/18/2016, 10:34 AM      

## 2016-06-19 NOTE — Op Note (Signed)
NAMEHEMINGWAY, MCRILL             ACCOUNT NO.:  [id number]  MEDICAL RECORD NO.:  [id number]  LOCATION:  [id number]                        FACILITY:  [facility]  PHYSICIAN:  Jie Stickels C. Lorin Mercy, M.D.    DATE OF BIRTH:  10-08-1966  DATE OF PROCEDURE:  06/19/2016 DATE OF DISCHARGE:                              OPERATIVE REPORT   PREOPERATIVE DIAGNOSIS:  Diabetic foot infection with left second toe osteomyelitis.  POSTOPERATIVE DIAGNOSIS:  Diabetic foot infection with left second toe osteomyelitis.  PROCEDURE:  Left second toe amputation.  SURGEON:  Alaney Witter C. Lorin Mercy, MD.  ANESTHESIA:  General.  ESTIMATED BLOOD LOSS:  Minimal.  TOURNIQUET TIME:  Less than 20 minutes.  INDICATION:  This 50 year old male, previous great toe amputation due to poorly controlled diabetes, now returns for second toe osteomyelitis. Previous surgery was done by Dr. Sharol Given.  The patient had plain radiographs on admission which shows a bit suspicious changes for osteomyelitis, second toe, distal phalanx, soft tissue air.  MRI scan showed cellulitis involving the second toe, documented the open wound at the tip, and osteomyelitis of the second toe, proximal, middle, and distal phalanx.  There was some edema noted in the first metatarsal head suggesting possible involvement.  DESCRIPTION OF PROCEDURE:  After standard prepping and draping with Betadine scrub and Betadine prep due to the open ulcer of the tip of the toe, usual half sheet stockinette extremity sheets and drapes were applied.  Time-out procedure was completed.  Racquet-handle incision was made at the base of the toe with dorsal and plantar flap.  Skin was excised.  There was no purulence noted in the subcutaneous tissue.  The flexor and extensor tendons were cut sharply with scalpel and MP joint was exposed amputating the toe.  Inspection of the metatarsal head did not show any changes.  On squeezing, I did not notice any purulence coming proximal to this area.   The flexor tendon sheath did not show purulence.  There were some changes on MRI but on gross inspection, there was no evidence of involvement of the MP joint or of the first metatarsal.  Copious irrigation was used with a bulb syringe. Esmarch was removed.  Hemostasis was obtained using the Bovie and the skin was closed with 2-0 nylon interrupted sutures.  Xeroform, 4 x 4s, Coban were applied for postoperative dressing.  Plan is 48 hours of IV antibiotics, and he can go home with 1 week worth of p.o. antibiotics, and office followup in 1 week.     Kingsten Enfield C. Lorin Mercy, M.D.     MCY/MEDQ  D:  06/19/2016  T:  06/19/2016  Job:  [id number]

## 2016-06-19 NOTE — Progress Notes (Addendum)
Patient ID: Jeffrey [name], male   DOB: 10-29-1966, 50 y.o.   MRN: [id number]   PROGRESS NOTE    [facility]  [id number] DOB: 04-23-1966 DOA: 06/17/2016  PCP: Jeffrey Morale, MD   Brief Narrative:  50 yo male, DM type II and noncompliant with last A1C 13 , previous great toe amp, presented with several days Hx of increased drainage foul smell right 2nd toe with swelling.   Assessment & Plan:   Principal Problem:   Diabetic foot infection ([place]) - s/p 2nd right toe amputation by Dr. Lorin [name] - per Dr. Lorin [name], did not see any infection more proximal - recommend 48 hrs IV ABX post op then send home on PO ABX for one week. (septra or cipro fine) - follow up with Dr. Lorin [name] in 1-2 weeks  Active Problems:   Essential hypertension - reasonable inpatient control     Uncontrolled type 2 diabetes mellitus with hyperglycemia, with long-term current use of insulin ([place]), neuropathy, PVD - continue Insulin Lantus and SSI    Anemia, iron deficiency - no signs of bleeding - CBC In AM    Hypokalemia, hypomagnesemia  - supplement and repeat BMP in AM - check mg in AM  DVT prophylaxis: Lovenox SQ Code Status: Full  Family Communication: Patient at bedside  Disposition Plan: In 1-2 days   Consultants:   Ortho    Procedures:   2nd right foot toe amputation 7/9  Antimicrobials:   Vancomycin and Zosyn 7/8 -->   Subjective: No events overnight.   Objective: Filed Vitals:   06/18/16 0955 06/18/16 1713 06/18/16 1959 06/19/16 0502  BP: 132/90 127/89 138/87 119/93  Pulse: 88 92 92 55  Temp: 98.9 F (37.2 C) 98.8 F (37.1 C) 99.2 F (37.3 C) 98.5 F (36.9 C)  TempSrc: Oral Oral Oral Oral  Resp: 18   18  Height:      Weight:   81.874 kg (180 lb 8 oz)   SpO2: 98% 97% 97% 97%    Intake/Output Summary (Last 24 hours) at 06/19/16 0733 Last data filed at 06/19/16 0553  Gross per 24 hour  Intake   1200 ml  Output    750 ml  Net    450 ml   Filed Weights   06/18/16  0222 06/18/16 1959  Weight: 81.4 kg (179 lb 7.3 oz) 81.874 kg (180 lb 8 oz)    Examination:  General exam: Appears calm and comfortable  Respiratory system: Clear to auscultation. Respiratory effort normal. Cardiovascular system: S1 & S2 heard, RRR. No JVD, murmurs, rubs, gallops or clicks. No pedal edema. Gastrointestinal system: Abdomen is nondistended, soft and nontender. No organomegaly or masses felt.  Central nervous system: Alert and oriented. No focal neurological deficits. Psychiatry: Judgement and insight appear normal. Mood & affect appropriate.    Data Reviewed: I have personally reviewed following labs and imaging studies  CBC:  Recent Labs Lab 06/17/16 1829 06/18/16 0534  WBC 6.4 5.5  HGB 11.6* 10.7*  HCT 37.5* 34.8*  MCV 73.8* 73.9*  PLT 348 [id number]   Basic Metabolic Panel:  Recent Labs Lab 06/17/16 1829 06/18/16 0534  NA 130* 135  K 3.4* 3.0*  CL 100* 105  CO2 22 24  GLUCOSE 477* 186*  BUN <5* <5*  CREATININE 0.89 0.62  CALCIUM 8.9 8.6*  MG  --  1.6*   Liver Function Tests:  Recent Labs Lab 06/17/16 1829  AST 12*  ALT 9*  ALKPHOS 95  BILITOT 0.3  PROT  7.4  ALBUMIN 2.9*   CBG:  Recent Labs Lab 06/18/16 0738 06/18/16 1142 06/18/16 1629 06/18/16 1956 06/19/16 0614  GLUCAP 135* 89 93 174* 99   Anemia Panel:  Recent Labs  06/18/16 0534  FERRITIN 74  TIBC 234*  IRON 22*   Urine analysis:    Component Value Date/Time   COLORURINE YELLOW 06/18/2016 0331   APPEARANCEUR CLEAR 06/18/2016 0331   LABSPEC 1.026 06/18/2016 0331   PHURINE 6.5 06/18/2016 0331   GLUCOSEU >1000* 06/18/2016 0331   HGBUR SMALL* 06/18/2016 0331   BILIRUBINUR NEGATIVE 06/18/2016 0331   BILIRUBINUR neg 03/30/2016 1116   KETONESUR NEGATIVE 06/18/2016 0331   PROTEINUR 100* 06/18/2016 0331   PROTEINUR 30 03/30/2016 1116   UROBILINOGEN 0.2 03/30/2016 1116   UROBILINOGEN 1.0 03/10/2014 2230   NITRITE NEGATIVE 06/18/2016 0331   NITRITE neg 03/30/2016 1116    LEUKOCYTESUR NEGATIVE 06/18/2016 0331   Recent Results (from the past 240 hour(s))  Blood Cultures x 2 sites     Status: None (Preliminary result)   Collection Time: 06/17/16 11:05 PM  Result Value Ref Range Status   Specimen Description BLOOD RIGHT HAND  Final   Special Requests IN PEDIATRIC BOTTLE 4ML  Final   Culture NO GROWTH < 24 HOURS  Final   Report Status PENDING  Incomplete  Blood Cultures x 2 sites     Status: None (Preliminary result)   Collection Time: 06/17/16 11:20 PM  Result Value Ref Range Status   Specimen Description BLOOD LEFT ARM  Final   Special Requests BOTTLES DRAWN AEROBIC AND ANAEROBIC 5ML  Final   Culture NO GROWTH < 24 HOURS  Final   Report Status PENDING  Incomplete  Surgical PCR screen     Status: Abnormal   Collection Time: 06/18/16  1:26 PM  Result Value Ref Range Status   MRSA, PCR NEGATIVE NEGATIVE Final   Staphylococcus aureus POSITIVE (A) NEGATIVE Final    Comment:        The Xpert SA Assay (FDA approved for NASAL specimens in patients over 9 years of age), is one component of a comprehensive surveillance program.  Test performance has been validated by [facility] for patients greater than or equal to 37 year old. It is not intended to diagnose infection nor to guide or monitor treatment.       Radiology Studies: Mri Right Foot Without Contrast  06/18/2016  CLINICAL DATA:  Pain, swelling and inflammation of the forefoot. History of prior great toe amputation. Diabetic. EXAM: MRI OF THE RIGHT FOREFOOT WITHOUT CONTRAST TECHNIQUE: Multiplanar, multisequence MR imaging was performed. No intravenous contrast was administered. COMPARISON:  Radiographs 06/17/2016 and prior MRI foot 02/19/2016 FINDINGS: There is diffuse subcutaneous soft tissue swelling/ edema/ fluid consistent with severe cellulitis. There is also diffuse myositis. Surgical changes from a prior great toe amputation. Diffuse signal abnormality in the second toe consistent with  osteomyelitis involving the proximal middle and distal phalanges. There is an open wound at the tip of the second toe and severe surrounding cellulitis. Edema like signal abnormalities also noted in the first metatarsal head. This is suspicious for osteomyelitis. Signal abnormality and the medial and lateral sesamoids of the great toe also suspicious for osteomyelitis. The flexor hallucis longus tendon is ruptured. Midfoot degenerative changes with probable degenerative edema involving the base of the second metatarsal, medial and middle cuneiforms. IMPRESSION: Severe cellulitis and myofasciitis. Open wound involving the tip of the second toe but no discrete drainable soft tissue abscess. MR findings  consistent with osteomyelitis involving the second toe (proximal middle and distal phalanges) and first metatarsal head and sesamoids. Moderate midfoot degenerative changes. Electronically Signed   By: Marijo Sanes M.D.   On: 06/18/2016 08:25   Ap / Lateral X-ray Right Foot  06/18/2016  CLINICAL DATA:  Infection.  Right foot pain. EXAM: RIGHT FOOT - 2 VIEW COMPARISON:  Radiographs 03/30/2016 FINDINGS: Post resection of the great toe. Soft tissue prominence at the resection bed without evident ulceration. Soft tissue air about the distal aspect of the second toe with second toe edema. Probable erosion about the second toe distal phalanx. Hammertoe deformity of the digits. There is diffuse dorsal soft tissue edema. Degenerative change in the midfoot. Vascular calcifications are seen. IMPRESSION: Findings suspicious for osteomyelitis of the second toe distal phalanx with adjacent soft tissue air, probable gangrene. Post resection of the great toe. Electronically Signed   By: Jeb Levering M.D.   On: 06/18/2016 00:24      Scheduled Meds: . Chlorhexidine Gluconate Cloth  6 each Topical Daily  . enoxaparin (LOVENOX) injection  40 mg Subcutaneous Daily  . ferrous sulfate  325 mg Oral BID WC  . gabapentin  400 mg  Oral TID  . insulin aspart  0-20 Units Subcutaneous TID WC  . insulin aspart  0-5 Units Subcutaneous QHS  . insulin glargine  66 Units Subcutaneous Q2200  . lisinopril  20 mg Oral Daily  . mupirocin ointment  1 application Nasal BID  . piperacillin-tazobactam (ZOSYN)  IV  3.375 g Intravenous Q8H  . vancomycin  750 mg Intravenous Q8H   Continuous Infusions:    LOS: 1 day    Time spent: 20 minutes    Faye Ramsay, MD [facility] Pager [phone]  If 7PM-7AM, please contact night-coverage [email or web address] Password [facility] 06/19/2016, 7:33 AM

## 2016-06-19 NOTE — Interval H&P Note (Signed)
History and Physical Interval Note:  06/19/2016 7:30 AM  Greggory Keen  has presented today for surgery, with the diagnosis of DIABETIC FOOT INFECTION  The various methods of treatment have been discussed with the patient and family. After consideration of risks, benefits and other options for treatment, the patient has consented to  Procedure(s): AMPUTATION SECOND TOE (Right) as a surgical intervention .  The patient's history has been reviewed, patient examined, no change in status, stable for surgery.  I have reviewed the patient's chart and labs.  Questions were answered to the patient's satisfaction.     YATES,MARK C

## 2016-06-20 ENCOUNTER — Ambulatory Visit: Payer: Self-pay | Admitting: Family Medicine

## 2016-06-20 ENCOUNTER — Telehealth: Payer: Self-pay | Admitting: Family Medicine

## 2016-06-20 ENCOUNTER — Encounter (HOSPITAL_COMMUNITY): Payer: Self-pay | Admitting: Orthopaedic Surgery

## 2016-06-20 LAB — GLUCOSE, CAPILLARY
GLUCOSE-CAPILLARY: 142 mg/dL — AB (ref 65–99)
GLUCOSE-CAPILLARY: 133 mg/dL — AB (ref 65–99)
Glucose-Capillary: 89 mg/dL (ref 65–99)
Glucose-Capillary: 173 mg/dL — ABNORMAL HIGH (ref 65–99)

## 2016-06-20 LAB — BASIC METABOLIC PANEL
ANION GAP: 5 (ref 5–15)
BUN: 8 mg/dL (ref 6–20)
CALCIUM: 8.5 mg/dL — AB (ref 8.9–10.3)
CO2: 25 mmol/L (ref 22–32)
Chloride: 107 mmol/L (ref 101–111)
Creatinine, Ser: 0.97 mg/dL (ref 0.61–1.24)
GFR calc Af Amer: >60 mL/min (ref >60)
GFR calc non Af Amer: >60 mL/min (ref >60)
GLUCOSE: 153 mg/dL — AB (ref 65–99)
Potassium: 3.7 mmol/L (ref 3.5–5.1)
Sodium: 137 mmol/L (ref 135–145)

## 2016-06-20 LAB — CBC
HEMATOCRIT: 36.5 % — AB (ref 39.0–52.0)
Hemoglobin: 11 g/dL — ABNORMAL LOW (ref 13.0–17.0)
MCH: 22.5 pg — AB (ref 26.0–34.0)
MCHC: 30.1 g/dL (ref 30.0–36.0)
MCV: 74.6 fL — AB (ref 78.0–100.0)
PLATELETS: 352 10*3/uL (ref 150–400)
RBC: 4.89 MIL/uL (ref 4.22–5.81)
RDW: 16.3 % — AB (ref 11.5–15.5)
WBC: 5.6 10*3/uL (ref 4.0–10.5)

## 2016-06-20 LAB — HEMOGLOBIN A1C
Hgb A1c MFr Bld: 13 % — ABNORMAL HIGH (ref 4.8–5.6)
Hgb A1c MFr Bld: 13 % — ABNORMAL HIGH (ref 4.8–5.6)
MEAN PLASMA GLUCOSE: 326 mg/dL
Mean Plasma Glucose: 326 mg/dL

## 2016-06-20 LAB — MAGNESIUM: Magnesium: 1.9 mg/dL (ref 1.7–2.4)

## 2016-06-20 NOTE — Progress Notes (Signed)
Patient ID: Jeffrey [name], male   DOB: 1966-09-26, 50 y.o.   MRN: [id number]   PROGRESS NOTE    Jeffrey [name]  [id number] DOB: 02-13-1966 DOA: 06/17/2016  PCP: Arnoldo Morale, MD   Brief Narrative:  50 yo male, DM type II and noncompliant with last A1C 13 , previous great toe amp, presented with several days Hx of increased drainage foul smell right 2nd toe with swelling.   Assessment & Plan:   Principal Problem:   Diabetic foot infection ([place]) - s/p 2nd right toe amputation by Dr. Lorin Mercy, post op day #1 - per Dr. Lorin Mercy, did not see any infection more proximal - recommend 48 hrs IV ABX post op then send home on PO ABX for one week. (septra or cipro fine) - follow up with Dr. Lorin Mercy in 1-2 weeks  - plan d/c in AM  Active Problems:   Essential hypertension - reasonable inpatient control     Uncontrolled type 2 diabetes mellitus with hyperglycemia, with long-term current use of insulin ([place]), neuropathy, PVD - continue Insulin Lantus and SSI    Anemia, iron deficiency - no signs of bleeding - CBC In AM    Hypokalemia, hypomagnesemia  - supplement and repeat BMP in AM - Mg supplemented as well - Mg in AM  DVT prophylaxis: Lovenox SQ Code Status: Full  Family Communication: Patient at bedside  Disposition Plan: Home in AM  Consultants:   Ortho    Procedures:   2nd right foot toe amputation 7/9  Antimicrobials:   Vancomycin and Zosyn 7/8 -->   Subjective: No events overnight.   Objective: Filed Vitals:   06/19/16 2131 06/20/16 0625 06/20/16 0646 06/20/16 0857  BP: 126/92 119/72  134/83  Pulse: 62 96  96  Temp: 98.3 F (36.8 C) 98.3 F (36.8 C)  98.7 F (37.1 C)  TempSrc: Oral Oral  Oral  Resp: 20 18  18   Height:      Weight:   87.726 kg (193 lb 6.4 oz)   SpO2: 97% 98%  98%    Intake/Output Summary (Last 24 hours) at 06/20/16 1411 Last data filed at 06/20/16 1151  Gross per 24 hour  Intake   1010 ml  Output   1925 ml  Net   -915 ml    Filed Weights   06/18/16 0222 06/18/16 1959 06/20/16 0646  Weight: 81.4 kg (179 lb 7.3 oz) 81.874 kg (180 lb 8 oz) 87.726 kg (193 lb 6.4 oz)    Examination:  General exam: Appears calm and comfortable  Respiratory system: Clear to auscultation. Respiratory effort normal. Cardiovascular system: S1 & S2 heard, RRR. No JVD, murmurs, rubs, gallops or clicks. No pedal edema. Gastrointestinal system: Abdomen is nondistended, soft and nontender. No organomegaly or masses felt.  Central nervous system: Alert and oriented. No focal neurological deficits. Psychiatry: Judgement and insight appear normal. Mood & affect appropriate.    Data Reviewed: I have personally reviewed following labs and imaging studies  CBC:  Recent Labs Lab 06/17/16 1829 06/18/16 0534 06/19/16 1027 06/20/16 0515  WBC 6.4 5.5 5.3 5.6  HGB 11.6* 10.7* 10.6* 11.0*  HCT 37.5* 34.8* 34.7* 36.5*  MCV 73.8* 73.9* 74.8* 74.6*  PLT 348 338 304 [id number]   Basic Metabolic Panel:  Recent Labs Lab 06/17/16 1829 06/18/16 0534 06/19/16 1027 06/20/16 0515  NA 130* 135 137 137  K 3.4* 3.0* 3.3* 3.7  CL 100* 105 107 107  CO2 22 24 25 25   GLUCOSE 477* 186* 90  153*  BUN <5* <5* 7 8  CREATININE 0.89 0.62 0.88 0.97  CALCIUM 8.9 8.6* 8.6* 8.5*  MG  --  1.6*  --  1.9   Liver Function Tests:  Recent Labs Lab 06/17/16 1829  AST 12*  ALT 9*  ALKPHOS 95  BILITOT 0.3  PROT 7.4  ALBUMIN 2.9*   CBG:  Recent Labs Lab 06/19/16 1129 06/19/16 1614 06/19/16 2129 06/20/16 0753 06/20/16 1147  GLUCAP 127* 165* 243* 89 142*   Anemia Panel:  Recent Labs  06/18/16 0534  FERRITIN 74  TIBC 234*  IRON 22*   Urine analysis:    Component Value Date/Time   COLORURINE YELLOW 06/18/2016 0331   APPEARANCEUR CLEAR 06/18/2016 0331   LABSPEC 1.026 06/18/2016 0331   PHURINE 6.5 06/18/2016 0331   GLUCOSEU >1000* 06/18/2016 0331   HGBUR SMALL* 06/18/2016 0331   BILIRUBINUR NEGATIVE 06/18/2016 0331   BILIRUBINUR neg  03/30/2016 1116   KETONESUR NEGATIVE 06/18/2016 0331   PROTEINUR 100* 06/18/2016 0331   PROTEINUR 30 03/30/2016 1116   UROBILINOGEN 0.2 03/30/2016 1116   UROBILINOGEN 1.0 03/10/2014 2230   NITRITE NEGATIVE 06/18/2016 0331   NITRITE neg 03/30/2016 1116   LEUKOCYTESUR NEGATIVE 06/18/2016 0331   Recent Results (from the past 240 hour(s))  Blood Cultures x 2 sites     Status: None (Preliminary result)   Collection Time: 06/17/16 11:05 PM  Result Value Ref Range Status   Specimen Description BLOOD RIGHT HAND  Final   Special Requests IN PEDIATRIC BOTTLE 4ML  Final   Culture NO GROWTH 3 DAYS  Final   Report Status PENDING  Incomplete  Blood Cultures x 2 sites     Status: None (Preliminary result)   Collection Time: 06/17/16 11:20 PM  Result Value Ref Range Status   Specimen Description BLOOD LEFT ARM  Final   Special Requests BOTTLES DRAWN AEROBIC AND ANAEROBIC 5ML  Final   Culture NO GROWTH 3 DAYS  Final   Report Status PENDING  Incomplete  Surgical PCR screen     Status: Abnormal   Collection Time: 06/18/16  1:26 PM  Result Value Ref Range Status   MRSA, PCR NEGATIVE NEGATIVE Final   Staphylococcus aureus POSITIVE (A) NEGATIVE Final    Comment:        The Xpert SA Assay (FDA approved for NASAL specimens in patients over 48 years of age), is one component of a comprehensive surveillance program.  Test performance has been validated by [facility] for patients greater than or equal to 51 year old. It is not intended to diagnose infection nor to guide or monitor treatment.       Radiology Studies: No results found.    Scheduled Meds: . Chlorhexidine Gluconate Cloth  6 each Topical Daily  . enoxaparin (LOVENOX) injection  40 mg Subcutaneous Daily  . ferrous sulfate  325 mg Oral BID WC  . gabapentin  400 mg Oral TID  . insulin aspart  0-20 Units Subcutaneous TID WC  . insulin aspart  0-5 Units Subcutaneous QHS  . insulin glargine  66 Units Subcutaneous Q2200  .  lisinopril  20 mg Oral Daily  . mupirocin ointment  1 application Nasal BID  . piperacillin-tazobactam (ZOSYN)  IV  3.375 g Intravenous Q8H  . vancomycin  750 mg Intravenous Q8H   Continuous Infusions:    LOS: 2 days    Time spent: 20 minutes    Faye Ramsay, MD [facility] Pager [phone]  If 7PM-7AM, please contact night-coverage  [email or web address] Password [email or web address] 06/20/2016, 2:11 PM

## 2016-06-20 NOTE — Telephone Encounter (Signed)
Pts caregiver called wanting to inform Dr. Jarold Song that the pt was admitted to the hospital this past Friday, 06/17/2016 Pt had a toe amputated yesterday morning, 06/19/2016

## 2016-06-20 NOTE — Progress Notes (Signed)
Subjective: Doing well.  Pain controlled.  Ambulating well with PT.    Objective: Vital signs in last 24 hours: Temp:  [98.3 F (36.8 C)-98.8 F (37.1 C)] 98.7 F (37.1 C) (07/10 0857) Pulse Rate:  [62-96] 96 (07/10 0857) Resp:  [18-20] 18 (07/10 0857) BP: (119-134)/(72-92) 134/83 mmHg (07/10 0857) SpO2:  [97 %-100 %] 98 % (07/10 0857) Weight:  [87.726 kg (193 lb 6.4 oz)] 87.726 kg (193 lb 6.4 oz) (07/10 0646)  Intake/Output from previous day: 07/09 0701 - 07/10 0700 In: 1070 [P.O.:420; IV Piggyback:650] Out: [id number] [Urine:1450; Blood:2] Intake/Output this shift: Total I/O In: 600 [P.O.:600] Out: 875 [Urine:875]   Recent Labs  06/17/16 1829 06/18/16 0534 06/19/16 1027 06/20/16 0515  HGB 11.6* 10.7* 10.6* 11.0*    Recent Labs  06/19/16 1027 06/20/16 0515  WBC 5.3 5.6  RBC 4.64 4.89  HCT 34.7* 36.5*  PLT 304 352    Recent Labs  06/19/16 1027 06/20/16 0515  NA 137 137  K 3.3* 3.7  CL 107 107  CO2 25 25  BUN 7 8  CREATININE 0.88 0.97  GLUCOSE 90 153*  CALCIUM 8.6* 8.5*   No results for input(s): LABPT, INR in the last 72 hours.  Exam:  Alert and oriented. Dressing clean, dry and intact.  Calf nontender.   Assessment/Plan: Continue present care.  Stable from ortho standpoint.     Kin Galbraith M 06/20/2016, 4:15 PM

## 2016-06-20 NOTE — Progress Notes (Signed)
PT Cancellation Note  Patient Details Name: Jeffrey [name] MRN: [id number] DOB: 04/26/1966   Cancelled Treatment:    Reason Eval/Treat Not Completed: Medical issues which prohibited therapy (awaiting response to weight bearing information).  Will try when information is complete.   Ramond Dial 06/20/2016, 11:20 AM    Mee Hives, PT MS Acute Rehab Dept. Number: [place] and [place]

## 2016-06-20 NOTE — Care Management Note (Signed)
Case Management Note  Patient Details  Name: CHAI WEAGLE MRN: [id number] Date of Birth: [birth year]  Subjective/Objective:          CM following for progression and d/c planning.           Action/Plan: 06/20/16 Noted consult for DME and HH.  Pt does not have insurance additionally a toe amp will often not qualify for HHPT, will provide DME as identified by PT eval.   Expected Discharge Date:                  Expected Discharge Plan:  Home/Self Care  In-House Referral:  NA  Discharge planning Services  CM Consult  Post Acute Care Choice:  Durable Medical Equipment Choice offered to:     DME Arranged:    DME Agency:  [place]:    [place] Agency:     Status of Service:  In process, will continue to follow  If discussed at Long Length of Stay Meetings, dates discussed:    Additional Comments:  Adron Bene, RN 06/20/2016, 10:31 AM

## 2016-06-20 NOTE — Progress Notes (Signed)
Inpatient Diabetes Program Recommendations  AACE/ADA: New Consensus Statement on Inpatient Glycemic Control (2015)  Target Ranges:  Prepandial:   less than 140 mg/dL      Peak postprandial:   less than 180 mg/dL (1-2 hours)      Critically ill patients:  140 - 180 mg/dL   Results for ANDREAZ, KALMBACH (MRN [id number]) as of 06/20/2016 14:12  Ref. Range 06/19/2016 08:28 06/19/2016 09:03 06/19/2016 11:29 06/19/2016 16:14 06/19/2016 21:29  Glucose-Capillary Latest Ref Range: 65-99 mg/dL 73 68 127 (H) 165 (H) 243 (H)   Results for ASPYN, SCHRAG (MRN [id number]) as of 06/20/2016 14:12  Ref. Range 06/20/2016 07:53 06/20/2016 11:47  Glucose-Capillary Latest Ref Range: 65-99 mg/dL 89 142 (H)   Results for KISHAUN, NAVES (MRN [id number]) as of 06/20/2016 14:12  Ref. Range 09/28/2015 04:31 02/24/2016 11:49 06/17/2016 23:27 06/18/2016 10:30  Hemoglobin A1C Latest Ref Range: 4.8-5.6 % 13.4 (H) 13.7 13.0 (H) 13.0 (H)   Admit with: Diabetic foot infection  History: DM  Home DM Meds: Lantus 66 units QHS       Novolog SSI  Current Insulin Orders: Lantus 66 units QHS      Novolog Resistant Correction Scale/ SSI (0-20 units) TID AC + HS     -Patient with chronically uncontrolled CBGs as evidenced by A1c levels >13% since October 2016.  -Spoke with patient about his current A1c of 13%.  Explained what an A1c is and what it measures.  Reminded patient that his goal A1c is 7% or less per ADA standards to prevent both acute and long-term complications.  Explained to patient the extreme importance of good glucose control at home.  Encouraged patient to check his CBGs at least tidac at home and to record all CBGs in a logbook for his PCP or Endocrinologist to review.  -Patient stated he has not been checking his CBGs or taking his insulin regularly and wants to "do better".  Stated to me that he knows his poor sugar control "is my own fault" and that he wants to "do better".  Patient told me he has access to insulin  and CBG meter/supplies.  A friend often helps with giving insulin.  Has been eating poor diet lately and stated he wants to improve his diet as well.  Asked patient if he had any questions about diabetes diet plan or how to take insulin.  Patient stated he did not have questions and feels like he knows what he needs to do.  "I just need to do better!".  -CBGs well controlled on current regimen.     --Will follow patient during hospitalization--  Wyn Quaker RN, MSN, CDE Diabetes Coordinator Inpatient Glycemic Control Team Team Pager: [phone] (8a-5p)

## 2016-06-20 NOTE — Evaluation (Signed)
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  PMHx:  non compliance for DM, HTN, back surgery,   Clinical Impression  Pt received permission for WBAT from Dr. Lorin Mercy by PT contacting through OR, must wear his cast boot with any standing activities.  Emphasized this as pt had been up to sink before PT arrival and stated he did wear the shoe.  Follow acutely for ROM, balance and gait    Follow Up Recommendations Home health PT;Other (comment) (will not have insurance coverage for this)    Equipment Recommendations  None recommended by PT    Recommendations for Other Services Rehab consult     Precautions / Restrictions Precautions Precautions: Fall Precaution Comments: must wear cast shoe when up  Required Braces or Orthoses: Other Brace/Splint (R cast shoe when up to stand/walk) Restrictions Weight Bearing Restrictions: Yes Other Position/Activity Restrictions: WBAT R foot with cast shoe      Mobility  Bed Mobility Overal bed mobility: Modified Independent                Transfers Overall transfer level: Modified independent Equipment used: Rolling walker (2 wheeled);1 person hand held assist                Ambulation/Gait Ambulation/Gait assistance: Min guard Ambulation Distance (Feet): 200 Feet Assistive device: Rolling walker (2 wheeled);1 person hand held assist Gait Pattern/deviations: Step-through pattern;Decreased stride length;Trunk flexed;Wide base of support;Decreased weight shift to right;Decreased stance time - right Gait velocity: reduced Gait velocity interpretation: Below normal speed for age/gender    Stairs            Wheelchair Mobility    Modified Rankin  (Stroke Patients Only)       Balance Overall balance assessment: Needs assistance Sitting-balance support: Feet supported Sitting balance-Leahy Scale: Good     Standing balance support: Bilateral upper extremity supported Standing balance-Leahy Scale: Fair                               Pertinent Vitals/Pain Pain Assessment: 0-10 Pain Score: 6  Pain Location: R foot Pain Descriptors / Indicators: Aching;Operative site guarding Pain Intervention(s): Monitored during session;Premedicated before session;Repositioned    Home Living Family/patient expects to be discharged to:: Private residence Living Arrangements: Non-relatives/Friends Available Help at Discharge: Family;Available 24 hours/day Type of Home: Apartment Home Access: Level entry     Home Layout: One level Home Equipment: Grab bars - toilet;Grab bars - tub/shower;Cane - single point;Walker - 2 wheels;Crutches (shower chair and knee scooter)      Prior Function Level of Independence: Independent         Comments: has SPC but not using regularly per pt     Hand Dominance        Extremity/Trunk Assessment   Upper Extremity Assessment: Overall WFL for tasks assessed           Lower Extremity Assessment: Overall WFL for tasks assessed      Cervical / Trunk Assessment: Normal  Communication   Communication: No difficulties  Cognition Arousal/Alertness: Awake/alert Behavior During Therapy: WFL for tasks assessed/performed Overall Cognitive Status: Within Functional Limits for tasks assessed  General Comments General comments (skin integrity, edema, etc.): Has been up to sink and per pt wore the boot earlier, but is not using walker for these short transitions    Exercises General Exercises - Lower Extremity Ankle Circles/Pumps: AROM;Both;10 reps Gluteal Sets: AROM;Both;10 reps;Standing Heel Slides: AROM;Both;10 reps Hip Flexion/Marching: AROM;Both;10  reps      Assessment/Plan    PT Assessment Patient needs continued PT services  PT Diagnosis Difficulty walking   PT Problem List Decreased range of motion;Decreased activity tolerance;Decreased balance;Decreased mobility;Decreased coordination;Decreased knowledge of use of DME;Pain;Decreased skin integrity  PT Treatment Interventions DME instruction;Gait training;Functional mobility training;Therapeutic activities;Therapeutic exercise;Balance training;Neuromuscular re-education;Patient/family education   PT Goals (Current goals can be found in the Care Plan section) Acute Rehab PT Goals Patient Stated Goal: to get up to walk PT Goal Formulation: With patient Time For Goal Achievement: 06/27/16 Potential to Achieve Goals: Good    Frequency Min 2X/week   Barriers to discharge Other (comment) (no access to follow up PT)      Co-evaluation               End of Session Equipment Utilized During Treatment: Gait belt Activity Tolerance: Patient tolerated treatment well;Patient limited by pain;No increased pain Patient left: in chair;with call bell/phone within reach Nurse Communication: Mobility status         Time: [id number] PT Time Calculation (min) (ACUTE ONLY): 41 min   Charges:   PT Evaluation $PT Eval Moderate Complexity: 1 Procedure PT Treatments $Gait Training: 8-22 mins $Therapeutic Exercise: 8-22 mins   PT G Codes:        Ramond Dial 07-17-16, 2:58 PM    Mee Hives, PT MS Acute Rehab Dept. Number: [place] and [place]

## 2016-06-21 DIAGNOSIS — D509 Iron deficiency anemia, unspecified: Secondary | ICD-10-CM

## 2016-06-21 LAB — HIV ANTIBODY (ROUTINE TESTING W REFLEX): HIV SCREEN 4TH GENERATION: NONREACTIVE

## 2016-06-21 LAB — CBC
HEMATOCRIT: 33.8 % — AB (ref 39.0–52.0)
HEMOGLOBIN: 10.4 g/dL — AB (ref 13.0–17.0)
MCH: 22.9 pg — AB (ref 26.0–34.0)
MCHC: 30.8 g/dL (ref 30.0–36.0)
MCV: 74.4 fL — AB (ref 78.0–100.0)
Platelets: 330 10*3/uL (ref 150–400)
RBC: 4.54 MIL/uL (ref 4.22–5.81)
RDW: 16.4 % — ABNORMAL HIGH (ref 11.5–15.5)
WBC: 4.7 10*3/uL (ref 4.0–10.5)

## 2016-06-21 LAB — GLUCOSE, CAPILLARY: Glucose-Capillary: 99 mg/dL (ref 65–99)

## 2016-06-21 MED ORDER — INSULIN GLARGINE 100 UNIT/ML SOLOSTAR PEN: 60.0000 [IU] | PEN_INJECTOR | Freq: Every day | SUBCUTANEOUS | Status: DC

## 2016-06-21 MED ORDER — SULFAMETHOXAZOLE-TRIMETHOPRIM 800-160 MG PO TABS: 1.0000 | ORAL_TABLET | Freq: Two times a day (BID) | ORAL | Status: DC

## 2016-06-21 MED ORDER — HYDROCODONE-ACETAMINOPHEN 5-325 MG PO TABS: 1.0000 | ORAL_TABLET | ORAL | Status: DC | PRN

## 2016-06-21 NOTE — Discharge Summary (Signed)
Physician Discharge Summary  Jeffrey [name] [id number] DOB: 1966/11/13 DOA: 06/17/2016  PCP: Arnoldo Morale, MD  Admit date: 06/17/2016 Discharge date: 06/21/2016  Recommendations for Outpatient Follow-up:  1. Pt will need to follow up with PCP in 2-3 weeks post discharge 2. Follow up with Dr. Lorin Mercy in 1 week 3. Please obtain BMP to evaluate electrolytes and kidney function 4. Please also check CBC to evaluate Hg and Hct levels 5. Complete therapy with Bactrim for 7 more days post discharge   Discharge Diagnoses:  Principal Problem:   Diabetic foot infection ([place]) Active Problems:   Essential hypertension   Diabetic neuropathy ([place])   Uncontrolled type 2 diabetes mellitus with hyperglycemia, with long-term current use of insulin (HCC)   Anemia, iron deficiency   Folliculitis   Hyponatremia   Hypokalemia   Discharge Condition: Stable  Diet recommendation: Heart healthy diet discussed in details    Brief Narrative:  50 yo male, DM type II and noncompliant with last A1C 13 , previous great toe amp, presented with several days Hx of increased drainage foul smell right 2nd toe with swelling.   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 Lorin Mercy, post op day #2 - per Dr. Lorin Mercy, did not see any infection more proximal - recommended 48 hrs IV ABX which was done and pt now stable to go home  - follow up with Dr. Lorin Mercy in 1 week - complete therapy with Bactrim for one week  Active Problems:  Essential hypertension - reasonable inpatient control    Uncontrolled type 2 diabetes mellitus with hyperglycemia, with long-term current use of insulin ([place]), neuropathy, PVD - continue Insulin Lantus and SSI   Anemia, iron deficiency - no signs of bleeding   Hypokalemia, hypomagnesemia  - supplemented and WNL  DVT prophylaxis: Lovenox SQ Code Status: Full  Family Communication: Patient at bedside   Disposition Plan: Home   Consultants:   Ortho   Procedures:   2nd right foot toe amputation 7/9 - site looks clean, no bleeding or drainage   Antimicrobials:   Vancomycin and Zosyn 7/8 --> transitioned to oral Bactrim upon discharge   Discharge Exam: Filed Vitals:   06/21/16 0443 06/21/16 0858  BP: 123/65 164/96  Pulse: 100 97  Temp: 98.4 F (36.9 C) 98.7 F (37.1 C)  Resp: 16 18   Filed Vitals:   06/20/16 1654 06/20/16 2055 06/21/16 0443 06/21/16 0858  BP: 142/86 136/80 123/65 164/96  Pulse: 96 100 100 97  Temp: 99 F (37.2 C) 98.9 F (37.2 C) 98.4 F (36.9 C) 98.7 F (37.1 C)  TempSrc: Oral Oral Oral Oral  Resp: 18 17 16 18   Height:      Weight:  88.542 kg (195 lb 3.2 oz)    SpO2: 99% 96% 100% 99%    General: Pt is alert, follows commands appropriately, not in acute distress Cardiovascular: Regular rate and rhythm, S1/S2 +, no rubs, no gallops Respiratory: Clear to auscultation bilaterally, no wheezing, no crackles, no rhonchi Abdominal: Soft, non tender, non distended, bowel sounds +, no guarding   Discharge Instructions  Discharge Instructions    Ambulatory referral to Nutrition and Diabetic Education    Complete by:  As directed      Diet - low sodium heart healthy    Complete by:  As directed      Increase activity slowly    Complete by:  As directed  Medication List    TAKE these medications        albuterol 108 (90 Base) MCG/ACT inhaler  Commonly known as:  PROVENTIL HFA;VENTOLIN HFA  Inhale 2 puffs into the lungs every 4 (four) hours as needed for wheezing or shortness of breath.     butalbital-acetaminophen-caffeine 50-325-40 MG tablet  Commonly known as:  ESGIC  Take 1 tablet by mouth every 6 (six) hours as needed for headache.     ferrous sulfate 325 (65 FE) MG tablet  Take 1 tablet (325 mg total) by mouth 2 (two) times daily with a meal.     gabapentin 400 MG capsule  Commonly known as:  NEURONTIN  Take 1  capsule (400 mg total) by mouth 3 (three) times daily.     HYDROcodone-acetaminophen 5-325 MG tablet  Commonly known as:  NORCO/VICODIN  Take 1-2 tablets by mouth every 4 (four) hours as needed for moderate pain.     insulin aspart 100 UNIT/ML injection  Commonly known as:  novoLOG  Inject 0-15 Units into the skin 3 (three) times daily with meals. Sliding scale  CBG 70 - 120: 0 units: CBG 121 - 140: 2 units; CBG 140 - 200: 4 units; CBG 201 - 240: 6 units; CBG 240 - 300: 8 units;CBG 300 - 350: 12 units; CBG 351 - 400: 16 units; CBG > 400 : 16 units and notify your  MD     Insulin Glargine 100 UNIT/ML Solostar Pen  Commonly known as:  LANTUS SOLOSTAR  Inject 60 Units into the skin daily at 10 pm.     lactulose 10 GM/15ML solution  Commonly known as:  CHRONULAC  Take 15 mLs (10 g total) by mouth 2 (two) times daily as needed for mild constipation.     lisinopril 20 MG tablet  Commonly known as:  PRINIVIL,ZESTRIL  Take 1 tablet (20 mg total) by mouth daily.     promethazine 25 MG tablet  Commonly known as:  PHENERGAN  Take 1 tablet (25 mg total) by mouth every 6 (six) hours as needed for nausea or vomiting.     sulfamethoxazole-trimethoprim 800-160 MG tablet  Commonly known as:  BACTRIM DS,SEPTRA DS  Take 1 tablet by mouth 2 (two) times daily.           Follow-up Information    Follow up with YATES,MARK C, MD In 1 week.   Specialty:  Orthopedic Surgery   Contact information:   [place] [address] [phone]       Follow up with Arnoldo Morale, MD.   Specialty:  Family Medicine   Contact information:   [place] [address] [phone]        The results of significant diagnostics from this hospitalization (including imaging, microbiology, ancillary and laboratory) are listed below for reference.     Microbiology: Recent Results (from the past 240 hour(s))  Blood Cultures x 2 sites     Status: None (Preliminary result)    Collection Time: 06/17/16 11:05 PM  Result Value Ref Range Status   Specimen Description BLOOD RIGHT HAND  Final   Special Requests IN PEDIATRIC BOTTLE 4ML  Final   Culture NO GROWTH 3 DAYS  Final   Report Status PENDING  Incomplete  Blood Cultures x 2 sites     Status: None (Preliminary result)   Collection Time: 06/17/16 11:20 PM  Result Value Ref Range Status   Specimen Description BLOOD LEFT ARM  Final  Special Requests BOTTLES DRAWN AEROBIC AND ANAEROBIC 5ML  Final   Culture NO GROWTH 3 DAYS  Final   Report Status PENDING  Incomplete  Surgical PCR screen     Status: Abnormal   Collection Time: 06/18/16  1:26 PM  Result Value Ref Range Status   MRSA, PCR NEGATIVE NEGATIVE Final   Staphylococcus aureus POSITIVE (A) NEGATIVE Final    Comment:        The Xpert SA Assay (FDA approved for NASAL specimens in patients over 69 years of age), is one component of a comprehensive surveillance program.  Test performance has been validated by [facility] for patients greater than or equal to 39 year old. It is not intended to diagnose infection nor to guide or monitor treatment.      Labs: Basic Metabolic Panel:  Recent Labs Lab 06/17/16 1829 06/18/16 0534 06/19/16 1027 06/20/16 0515  NA 130* 135 137 137  K 3.4* 3.0* 3.3* 3.7  CL 100* 105 107 107  CO2 22 24 25 25   GLUCOSE 477* 186* 90 153*  BUN <5* <5* 7 8  CREATININE 0.89 0.62 0.88 0.97  CALCIUM 8.9 8.6* 8.6* 8.5*  MG  --  1.6*  --  1.9   Liver Function Tests:  Recent Labs Lab 06/17/16 1829  AST 12*  ALT 9*  ALKPHOS 95  BILITOT 0.3  PROT 7.4  ALBUMIN 2.9*   CBC:  Recent Labs Lab 06/17/16 1829 06/18/16 0534 06/19/16 1027 06/20/16 0515 06/21/16 0624  WBC 6.4 5.5 5.3 5.6 4.7  HGB 11.6* 10.7* 10.6* 11.0* 10.4*  HCT 37.5* 34.8* 34.7* 36.5* 33.8*  MCV 73.8* 73.9* 74.8* 74.6* 74.4*  PLT 348 338 304 352 330   CBG:  Recent Labs Lab 06/20/16 0753 06/20/16 1147 06/20/16 1657 06/20/16 2053  06/21/16 0736  GLUCAP 89 142* 133* 173* 99     SIGNED: Time coordinating discharge:  30 minutes  MAGICK-Noell Lorensen, MD  [facility] 06/21/2016, 10:12 AM Pager [phone]  If 7PM-7AM, please contact night-coverage [email or web address] Password [email or web address]

## 2016-06-21 NOTE — Progress Notes (Signed)
Discharge instructions and medications discussed with patient.  Prescriptions given to patient's mom.  All questions answered.

## 2016-06-21 NOTE — Discharge Instructions (Signed)
Elevate foot, leave dressing on , use post op shoe. Keep dressing dry. See Dr. Lorin Mercy in one week for dressing change.   Blood Glucose Monitoring, Adult Monitoring your blood glucose (also know as blood sugar) helps you to manage your diabetes. It also helps you and your health care provider monitor your diabetes and determine how well your treatment plan is working. WHY SHOULD YOU MONITOR YOUR BLOOD GLUCOSE?  It can help you understand how food, exercise, and medicine affect your blood glucose.  It allows you to know what your blood glucose is at any given moment. You can quickly tell if you are having low blood glucose (hypoglycemia) or high blood glucose (hyperglycemia).  It can help you and your health care provider know how to adjust your medicines.  It can help you understand how to manage an illness or adjust medicine for exercise. WHEN SHOULD YOU TEST? Your health care provider will help you decide how often you should check your blood glucose. This may depend on the type of diabetes you have, your diabetes control, or the types of medicines you are taking. Be sure to write down all of your blood glucose readings so that this information can be reviewed with your health care provider. See below for examples of testing times that your health care provider may suggest. Type 1 Diabetes  Test at least 2 times per day if your diabetes is well controlled, if you are using an insulin pump, or if you perform multiple daily injections.  If your diabetes is not well controlled or if you are sick, you may need to test more often.  It is a good idea to also test:  Before every insulin injection.  Before and after exercise.  Between meals and 2 hours after a meal.  Occasionally between 2:00 a.m. and 3:00 a.m. Type 2 Diabetes  If you are taking insulin, test at least 2 times per day. However, it is best to test before every insulin injection.  If you take medicines by mouth (orally), test  2 times a day.  If you are on a controlled diet, test once a day.  If your diabetes is not well controlled or if you are sick, you may need to monitor more often. HOW TO MONITOR YOUR BLOOD GLUCOSE Supplies Needed  Blood glucose meter.  Test strips for your meter. Each meter has its own strips. You must use the strips that go with your own meter.  A pricking needle (lancet).  A device that holds the lancet (lancing device).  A journal or log book to write down your results. Procedure  Wash your hands with soap and water. Alcohol is not preferred.  Prick the side of your finger (not the tip) with the lancet.  Gently milk the finger until a small drop of blood appears.  Follow the instructions that come with your meter for inserting the test strip, applying blood to the strip, and using your blood glucose meter. Other Areas to Get Blood for Testing Some meters allow you to use other areas of your body (other than your finger) to test your blood. These areas are called alternative sites. The most common alternative sites are:  The forearm.  The thigh.  The back area of the lower leg.  The palm of the hand. The blood flow in these areas is slower. Therefore, the blood glucose values you get may be delayed, and the numbers are different from what you would get from your fingers. Do  not use alternative sites if you think you are having hypoglycemia. Your reading will not be accurate. Always use a finger if you are having hypoglycemia. Also, if you cannot feel your lows (hypoglycemia unawareness), always use your fingers for your blood glucose checks. ADDITIONAL TIPS FOR GLUCOSE MONITORING  Do not reuse lancets.  Always carry your supplies with you.  All blood glucose meters have a 24-hour "hotline" number to call if you have questions or need help.  Adjust (calibrate) your blood glucose meter with a control solution after finishing a few boxes of strips. BLOOD GLUCOSE RECORD  KEEPING It is a good idea to keep a daily record or log of your blood glucose readings. Most glucose meters, if not all, keep your glucose records stored in the meter. Some meters come with the ability to download your records to your home computer. Keeping a record of your blood glucose readings is especially helpful if you are wanting to look for patterns. Make notes to go along with the blood glucose readings because you might forget what happened at that exact time. Keeping good records helps you and your health care provider to work together to achieve good diabetes management.    This information is not intended to replace advice given to you by your health care provider. Make sure you discuss any questions you have with your health care provider.   Document Released: 12/01/2003 Document Revised: 12/19/2014 Document Reviewed: 04/22/2013 Elsevier Interactive Patient Education [employer].

## 2016-06-21 NOTE — Care Management Note (Signed)
Case Management Note  Patient Details  Name: Jeffrey [name] MRN: [id number] Date of Birth: December 06, 1966  Subjective/Objective:         CM following for progression and d/c planning.            Action/Plan: 06/21/2016 Per PT eval pt has no DME needs and does not qualify for HHPT.  Pt is ambulatory and has assistance in the home.   Expected Discharge Date:   06/22/2016               Expected Discharge Plan:  Home/Self Care  In-House Referral:  NA  Discharge planning Services  CM Consult  Post Acute Care Choice:  NA Choice offered to:  NA  DME Arranged:  N/A DME Agency:  NA  HH Arranged:  NA HH Agency:  NA  Status of Service:  Completed, signed off  If discussed at [place] of Stay Meetings, dates discussed:    Additional Comments:  Adron Bene, RN 06/21/2016, 11:00 AM

## 2016-06-22 LAB — CULTURE, BLOOD (ROUTINE X 2)
CULTURE: NO GROWTH
CULTURE: NO GROWTH

## 2016-06-22 MED FILL — SULFAMETHOXAZOLE-TMP DS TAB: 800-160 | 7 days supply | Qty: 14 | Fill #0

## 2016-06-22 MED FILL — !NOVOLOG 100UNITS/ML VIAL: 100/ML | 20 days supply | Qty: 10 | Fill #1

## 2016-06-24 ENCOUNTER — Ambulatory Visit: Payer: Self-pay | Attending: Family Medicine | Admitting: Family Medicine

## 2016-06-24 ENCOUNTER — Encounter: Payer: Self-pay | Admitting: Family Medicine

## 2016-06-24 VITALS — BP 158/94 | HR 97 | Temp 98.5°F | Ht 65.0 in | Wt 196.0 lb

## 2016-06-24 DIAGNOSIS — S98131A Complete traumatic amputation of one right lesser toe, initial encounter: Secondary | ICD-10-CM

## 2016-06-24 DIAGNOSIS — Z794 Long term (current) use of insulin: Secondary | ICD-10-CM | POA: Insufficient documentation

## 2016-06-24 DIAGNOSIS — R0600 Dyspnea, unspecified: Secondary | ICD-10-CM | POA: Insufficient documentation

## 2016-06-24 DIAGNOSIS — Z9119 Patient's noncompliance with other medical treatment and regimen: Secondary | ICD-10-CM | POA: Insufficient documentation

## 2016-06-24 DIAGNOSIS — Z89421 Acquired absence of other right toe(s): Secondary | ICD-10-CM | POA: Insufficient documentation

## 2016-06-24 DIAGNOSIS — E1142 Type 2 diabetes mellitus with diabetic polyneuropathy: Secondary | ICD-10-CM

## 2016-06-24 DIAGNOSIS — I10 Essential (primary) hypertension: Secondary | ICD-10-CM | POA: Insufficient documentation

## 2016-06-24 DIAGNOSIS — Z79899 Other long term (current) drug therapy: Secondary | ICD-10-CM | POA: Insufficient documentation

## 2016-06-24 DIAGNOSIS — E1165 Type 2 diabetes mellitus with hyperglycemia: Secondary | ICD-10-CM | POA: Insufficient documentation

## 2016-06-24 LAB — CBC WITH DIFFERENTIAL/PLATELET
BASOS PCT: 0 %
Basophils Absolute: 0 cells/uL (ref 0–200)
EOS ABS: 86 {cells}/uL (ref 15–500)
Eosinophils Relative: 1 %
HEMATOCRIT: 34.8 % — AB (ref 38.5–50.0)
HEMOGLOBIN: 11.1 g/dL — AB (ref 13.2–17.1)
LYMPHS ABS: 2580 {cells}/uL (ref 850–3900)
LYMPHS PCT: 30 %
MCH: 22.9 pg — ABNORMAL LOW (ref 27.0–33.0)
MCHC: 31.9 g/dL — ABNORMAL LOW (ref 32.0–36.0)
MCV: 71.9 fL — AB (ref 80.0–100.0)
MONO ABS: 516 {cells}/uL (ref 200–950)
MPV: 9.4 fL (ref 7.5–12.5)
Monocytes Relative: 6 %
Neutro Abs: 5418 cells/uL (ref 1500–7800)
Neutrophils Relative %: 63 %
Platelets: 463 10*3/uL — ABNORMAL HIGH (ref 140–400)
RBC: 4.84 MIL/uL (ref 4.20–5.80)
RDW: 17.3 % — AB (ref 11.0–15.0)
WBC: 8.6 10*3/uL (ref 3.8–10.8)

## 2016-06-24 LAB — GLUCOSE, POCT (MANUAL RESULT ENTRY): POC Glucose: 55 mg/dl — AB (ref 70–99)

## 2016-06-24 NOTE — Progress Notes (Signed)
Subjective:    Patient ID: Jeffrey [name], male    DOB: [birth year], 50 y.o.   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  He was discharged on Bactrim which he is currently taking and was supposed to follow-up with his orthopedics-Dr. Lorin Mercy in one week but states the office is working on getting him an appointment due to a full schedule.  He is accompanied by his fiance who complains that the patient developed pedal edema of the left foot which was transient and resolved spontaneously; he has also had some cough and orthopnea. Denies recent weight gain. He does not have his blood sugar log with him but states his blood sugar fluctuates from low to high and he has had some 130s for fasting sugars at other times 300s for random sugars. The patient does a lot of late night snacking.  Past Medical History  Diagnosis Date  . Diabetes mellitus   . HTN (hypertension)   . Diabetic foot infection ([place]) 03/2016    RT FOOT    Past Surgical History  Procedure Laterality Date  . Back surgery      for abscess  . Amputation Right 04/01/2016    Procedure: Right Great Toe Amputation;  Surgeon: Newt Minion, MD;  Location: [facility];  Service: Orthopedics;  Laterality: Right;  . Amputation Right 06/19/2016    Procedure: AMPUTATION SECOND TOE;  Surgeon: Marybelle Killings, MD;  Location: [facility];  Service: Orthopedics;  Laterality: Right;    No Known Allergies  Current Outpatient Prescriptions on File Prior to Visit  Medication Sig Dispense Refill  . albuterol (PROVENTIL HFA;VENTOLIN HFA) 108 (90 Base) MCG/ACT inhaler Inhale 2 puffs into the lungs every 4 (four) hours as needed for wheezing or shortness of breath. 1 Inhaler 2  .  butalbital-acetaminophen-caffeine (ESGIC) 50-325-40 MG tablet Take 1 tablet by mouth every 6 (six) hours as needed for headache. 30 tablet 0  . ferrous sulfate 325 (65 FE) MG tablet Take 1 tablet (325 mg total) by mouth 2 (two) times daily with a meal. 60 tablet 3  . gabapentin (NEURONTIN) 400 MG capsule Take 1 capsule (400 mg total) by mouth 3 (three) times daily. 90 capsule 3  . HYDROcodone-acetaminophen (NORCO/VICODIN) 5-325 MG tablet Take 1-2 tablets by mouth every 4 (four) hours as needed for moderate pain. 15 tablet 0  . insulin aspart (NOVOLOG) 100 UNIT/ML injection Inject 0-15 Units into the skin 3 (three) times daily with meals. Sliding scale  CBG 70 - 120: 0 units: CBG 121 - 140: 2 units; CBG 140 - 200: 4 units; CBG 201 - 240: 6 units; CBG 240 - 300: 8 units;CBG 300 - 350: 12 units; CBG 351 - 400: 16 units; CBG > 400 : 16 units and notify your  MD 10 mL 3  . Insulin Glargine (LANTUS SOLOSTAR) 100 UNIT/ML Solostar Pen Inject 60 Units into the skin daily at 10 pm. 5 pen 3  . lactulose (CHRONULAC) 10 GM/15ML solution Take 15 mLs (10 g total) by mouth 2 (two) times daily as needed for mild constipation. 946 mL 1  . lisinopril (PRINIVIL,ZESTRIL) 20 MG tablet Take 1 tablet (20 mg total) by mouth daily. 30 tablet 3  . promethazine (PHENERGAN) 25 MG tablet Take 1 tablet (  25 mg total) by mouth every 6 (six) hours as needed for nausea or vomiting. 30 tablet 0  . sulfamethoxazole-trimethoprim (BACTRIM DS,SEPTRA DS) 800-160 MG tablet Take 1 tablet by mouth 2 (two) times daily. 14 tablet 0   No current facility-administered medications on file prior to visit.     Review of Systems Constitutional: Negative for activity change and appetite change.  HENT: Negative for sinus pressure and sore throat.   Eyes: Negative for visual disturbance.  Respiratory: Positive for for cough, Negative for chest tightness and shortness of breath.   Cardiovascular: Negative for chest pain and Positive for leg swelling.    Gastrointestinal: Negative for abdominal pain, diarrhea, constipation and abdominal distention.  Endocrine: Negative.   Genitourinary: Negative for dysuria.  Musculoskeletal:       See history of present illness  Skin: Negative for rash.  Allergic/Immunologic: Negative.   Neurological: Negative for weakness, light-headedness and numbness.  Psychiatric/Behavioral: Negative for suicidal ideas and dysphoric mood.      Objective: Filed Vitals:   06/24/16 1526  BP: 158/94  Pulse: 97  Temp: 98.5 F (36.9 C)  TempSrc: Oral  Height: 5\' 5"  (1.651 m)  Weight: 196 lb (88.905 kg)  SpO2: 96%     Physical Exam Constitutional: He is oriented to person, place, and time. He appears well-developed and well-nourished.  Cardiovascular: Normal rate, normal heart sounds and intact distal pulses.   No murmur heard. Pulmonary/Chest: Effort normal and breath sounds normal. He has no wheezes. He has no rales. He exhibits no tenderness.  Abdominal: Soft. Bowel sounds are normal. He exhibits no distension and no mass. There is no tenderness.  Musculoskeletal: Normal range of motion.  Right great toe amputation. Second right toe amputation, slight bloody discharge.Skin proximal to toes appears Dusky  Sensory loss present  in right foot, absent in the left  skin of left second toe appears dusky but he has strong pulses , no evidence of infection.  Neurological: He is alert and oriented to person, place, and time.        Assessment & Plan:  1. Type 2 diabetes mellitus with hyperglycemia, with long-term current use of insulin (HCC) Uncontrolled with A1c of 13.0 due to noncompliance He does have brittle diabetes as his blood sugar in the clinic is 55 - he skipped lunch Poor control due to noncompliance and dietary indiscretion I have explained to him that he is at high risk of complications with this lifestyle. We'll review blood sugar log at next visit and make adjustments in regimen.   2. Diabetic  polyneuropathy associated with type 2 diabetes mellitus (HCC) Uncontrolled on gabapentin. Educated that strict glycemic control should bring about improvement in symptoms  3. 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 I have explained to him that his neuropathy also puts him at risk. Continue Bactrim Patient advised to keep calling orthopedic office- Dr. Lorin Mercy office is working on getting him in as soon as possible.  Dressing change performed in clinic, continue dressing changes at home as per protocol.   4. Essential hypertension Mildly elevated and above target of less than 140/90 Continue antihypertensives Low-sodium,-type. Labs today.  5. Dyspnea We'll send a BNP to rule out cardiac etiology. especially given history of orthopnea and left pedal edema. Could also be secondary to deconditioning.

## 2016-06-25 LAB — COMPLETE METABOLIC PANEL WITH GFR
ALT: 7 U/L — AB (ref 9–46)
AST: 11 U/L (ref 10–35)
Albumin: 3.5 g/dL — ABNORMAL LOW (ref 3.6–5.1)
Alkaline Phosphatase: 83 U/L (ref 40–115)
BUN: 11 mg/dL (ref 7–25)
CHLORIDE: 107 mmol/L (ref 98–110)
CO2: 23 mmol/L (ref 20–31)
CREATININE: 0.89 mg/dL (ref 0.70–1.33)
Calcium: 8.8 mg/dL (ref 8.6–10.3)
GFR, Est Non African American: >89 mL/min (ref >=60)
Glucose, Bld: 52 mg/dL — ABNORMAL LOW (ref 65–99)
POTASSIUM: 3.5 mmol/L (ref 3.5–5.3)
Sodium: 139 mmol/L (ref 135–146)
Total Bilirubin: 0.2 mg/dL (ref 0.2–1.2)
Total Protein: 7 g/dL (ref 6.1–8.1)

## 2016-06-25 LAB — BRAIN NATRIURETIC PEPTIDE: Brain Natriuretic Peptide: 99.6 pg/mL (ref <100)

## 2016-06-28 ENCOUNTER — Telehealth: Payer: Self-pay

## 2016-06-28 NOTE — Telephone Encounter (Signed)
RN advised patient: Labs revealed mild anemia otherwise stable; no evidence of CHF

## 2016-06-28 NOTE — Telephone Encounter (Signed)
-----   Message from Arnoldo Morale, MD sent at 06/27/2016  1:21 PM EDT ----- Labs revealed mild anemia otherwise stable; no evidence of CHF

## 2016-06-28 NOTE — Telephone Encounter (Signed)
Voicemail left requesting return call from patient. RN attempted to advise patient: Labs revealed mild anemia otherwise stable; no evidence of CHF

## 2016-07-28 ENCOUNTER — Encounter: Payer: Self-pay | Admitting: Skilled Nursing Facility1

## 2016-07-28 ENCOUNTER — Encounter: Payer: Self-pay | Attending: Family Medicine | Admitting: Skilled Nursing Facility1

## 2016-07-28 DIAGNOSIS — E1165 Type 2 diabetes mellitus with hyperglycemia: Secondary | ICD-10-CM | POA: Insufficient documentation

## 2016-07-28 DIAGNOSIS — Z713 Dietary counseling and surveillance: Secondary | ICD-10-CM | POA: Insufficient documentation

## 2016-07-28 DIAGNOSIS — Z794 Long term (current) use of insulin: Secondary | ICD-10-CM

## 2016-07-28 NOTE — Patient Instructions (Signed)
-  Always bring your meter with you everywhere you go -Always Properly dispose of your needles:  -Discard in a hard plastic/metal container with a lid (something the needle can't puncture)  -Write Do Not Recycle on the outside of the container  -Example: A laundry detergent bottle -Never use the same needle more than once -A meal: carbohydrates, protein, vegetable -A snack: A Fruit OR Vegetable AND Protein  -Try to be more active -Always pay attention to your body keeping watchful of possible low blood sugar (below 70) or high blood sugar (above 200)  -Ask your doctor for a referral to a endocrinologist  -Try not to eat in the middle of the night; if you wake up check your blood sugar and eat if it is below 70, go back to sleep if it is not -Caution with high fat/protein meals as they may cause higher blood sugar numbers 3-4 hours later -For snacks: about 15 grams of carbohydrate and about 8 grams of protein -Fruit cup: no syrup

## 2016-07-28 NOTE — Progress Notes (Signed)
Diabetes Self-Management Education  Visit Type: First/Initial  Appt. Start Time: 11:15Appt. End Time: 12:15  07/28/2016  Mr. Jeffrey [name], identified by name and date of birth, is a 50 y.o. male with a diagnosis of Diabetes: Type 2.   ASSESSMENT  Height 5\' 7"  (1.702 m), weight 192 lb (87.1 kg). Body mass index is 30.07 kg/m. Pts girlfriend is directly involved in the pts care and makes sure he takes his insulin per the perscription. 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 pts girlfriend states the pt has started to drink more water. Pt states he wakes up in the middle of the night and eats leftovers. Pt states he was drinking cool aid and soda but has stopped that behavior.     Diabetes Self-Management Education - 07/28/16 1123      Visit Information   Visit Type First/Initial     Initial Visit   Diabetes Type Type 2   Are you currently following a meal plan? No   Are you taking your medications as prescribed? Yes   Date Diagnosed a couple years ago     Health Coping   How would you rate your overall health? Fair     Psychosocial Assessment   Patient Belief/Attitude about Diabetes Afraid   Patient Concerns Nutrition/Meal planning;Glycemic Control     Pre-Education Assessment   Patient understands the diabetes disease and treatment process. Needs Instruction   Patient understands incorporating nutritional management into lifestyle. Needs Instruction   Patient undertands incorporating physical activity into lifestyle. Needs Instruction   Patient understands using medications safely. Needs Instruction   Patient understands monitoring blood glucose, interpreting and using results Needs Instruction   Patient understands prevention, detection, and treatment of acute complications. Needs Instruction   Patient understands prevention, detection, and treatment of chronic complications. Needs Instruction   Patient understands how to develop strategies to  address psychosocial issues. Needs Instruction   Patient understands how to develop strategies to promote health/change behavior. Needs Instruction     Complications   Last HgB A1C per patient/outside source 13 %   How often do you check your blood sugar? 3-4 times/day   Fasting Blood glucose range (mg/dL) 70-129   Postprandial Blood glucose range (mg/dL) >200   Have you had a dilated eye exam in the past 12 months? No   Have you had a dental exam in the past 12 months? No   Are you checking your feet? Yes   How many days per week are you checking your feet? 7     Dietary Intake   Breakfast spagetti and meatballs-----cereal----oatmeal----eggs and bacon   Snack (morning) bread   Lunch noodles-----sandwich and chips   Snack (afternoon) chips and dip   Dinner rice, cubed steak, gravy, turnips, corn bread   Snack (evening) fruit   Beverage(s) water     Exercise   Exercise Type Light (walking / raking leaves)   How many days per week to you exercise? 6   How many minutes per day do you exercise? 60   Total minutes per week of exercise 360     Patient Education   Previous Diabetes Education No   Nutrition management  Role of diet in the treatment of diabetes and the relationship between the three main macronutrients and blood glucose level;Food label reading, portion sizes and measuring food.;Carbohydrate counting;Reviewed blood glucose goals for pre and post meals and how to evaluate the patients' food intake on their blood glucose level.;Meal  timing in regards to the patients' current diabetes medication.;Information on hints to eating out and maintain blood glucose control.;Meal options for control of blood glucose level and chronic complications.   Physical activity and exercise  Role of exercise on diabetes management, blood pressure control and cardiac health.;Identified with patient nutritional and/or medication changes necessary with exercise.;Helped patient identify appropriate  exercises in relation to his/her diabetes, diabetes complications and other health issue.   Monitoring Purpose and frequency of SMBG.;Yearly dilated eye exam;Daily foot exams;Identified appropriate SMBG and/or A1C goals.   Acute complications Taught treatment of hypoglycemia - the 15 rule.;Discussed and identified patients' treatment of hyperglycemia.   Chronic complications Relationship between chronic complications and blood glucose control;Assessed and discussed foot care and prevention of foot problems;Retinopathy and reason for yearly dilated eye exams     Individualized Goals (developed by patient)   Physical Activity Exercise 5-7 days per week;60 minutes per day   Medications take my medication as prescribed     Post-Education Assessment   Patient understands the diabetes disease and treatment process. Demonstrates understanding / competency   Patient understands incorporating nutritional management into lifestyle. Demonstrates understanding / competency   Patient undertands incorporating physical activity into lifestyle. Demonstrates understanding / competency   Patient understands using medications safely. Demonstrates understanding / competency   Patient understands monitoring blood glucose, interpreting and using results Demonstrates understanding / competency   Patient understands prevention, detection, and treatment of acute complications. Demonstrates understanding / competency   Patient understands prevention, detection, and treatment of chronic complications. Demonstrates understanding / competency   Patient understands how to develop strategies to address psychosocial issues. Demonstrates understanding / competency   Patient understands how to develop strategies to promote health/change behavior. Demonstrates understanding / competency     Outcomes   Expected Outcomes Demonstrated interest in learning. Expect positive outcomes   Future DMSE PRN   Program Status Completed       Individualized Plan for Diabetes Self-Management Training:   Learning Objective:  Patient will have a greater understanding of diabetes self-management. Patient education plan is to attend individual and/or group sessions per assessed needs and concerns.   Plan:   Patient Instructions  -Always bring your meter with you everywhere you go -Always Properly dispose of your needles:  -Discard in a hard plastic/metal container with a lid (something the needle can't puncture)  -Write Do Not Recycle on the outside of the container  -Example: A laundry detergent bottle -Never use the same needle more than once -A meal: carbohydrates, protein, vegetable -A snack: A Fruit OR Vegetable AND Protein  -Try to be more active -Always pay attention to your body keeping watchful of possible low blood sugar (below 70) or high blood sugar (above 200)  -Ask your doctor for a referral to a endocrinologist  -Try not to eat in the middle of the night; if you wake up check your blood sugar and eat if it is below 70, go back to sleep if it is not -Caution with high fat/protein meals as they may cause higher blood sugar numbers 3-4 hours later -For snacks: about 15 grams of carbohydrate and about 8 grams of protein -Fruit cup: no syrup   Expected Outcomes:  Demonstrated interest in learning. Expect positive outcomes  Education material provided: Living Well with Diabetes, My Plate and Snack sheet  If problems or questions, patient to contact team via:  Phone  Future DSME appointment: PRN

## 2016-07-29 ENCOUNTER — Ambulatory Visit: Payer: Self-pay | Admitting: Family Medicine

## 2016-09-12 ENCOUNTER — Inpatient Hospital Stay (HOSPITAL_COMMUNITY)
Admission: EM | Admit: 2016-09-12 | Discharge: 2016-09-15 | DRG: 603 | Disposition: A | Discharge status: Home Health | Payer: Self-pay | Attending: Internal Medicine | Admitting: Internal Medicine

## 2016-09-12 ENCOUNTER — Encounter (HOSPITAL_COMMUNITY): Payer: Self-pay | Admitting: Emergency Medicine

## 2016-09-12 DIAGNOSIS — Z89421 Acquired absence of other right toe(s): Secondary | ICD-10-CM

## 2016-09-12 DIAGNOSIS — Z833 Family history of diabetes mellitus: Secondary | ICD-10-CM

## 2016-09-12 DIAGNOSIS — L0231 Cutaneous abscess of buttock: Principal | ICD-10-CM | POA: Diagnosis present

## 2016-09-12 DIAGNOSIS — E118 Type 2 diabetes mellitus with unspecified complications: Secondary | ICD-10-CM

## 2016-09-12 DIAGNOSIS — Z8249 Family history of ischemic heart disease and other diseases of the circulatory system: Secondary | ICD-10-CM

## 2016-09-12 DIAGNOSIS — D509 Iron deficiency anemia, unspecified: Secondary | ICD-10-CM | POA: Diagnosis present

## 2016-09-12 DIAGNOSIS — Z794 Long term (current) use of insulin: Secondary | ICD-10-CM

## 2016-09-12 DIAGNOSIS — T383X6A Underdosing of insulin and oral hypoglycemic [antidiabetic] drugs, initial encounter: Secondary | ICD-10-CM

## 2016-09-12 DIAGNOSIS — Z91128 Patient's intentional underdosing of medication regimen for other reason: Secondary | ICD-10-CM

## 2016-09-12 DIAGNOSIS — B9561 Methicillin susceptible Staphylococcus aureus infection as the cause of diseases classified elsewhere: Secondary | ICD-10-CM | POA: Diagnosis present

## 2016-09-12 DIAGNOSIS — E1165 Type 2 diabetes mellitus with hyperglycemia: Secondary | ICD-10-CM

## 2016-09-12 DIAGNOSIS — R739 Hyperglycemia, unspecified: Secondary | ICD-10-CM

## 2016-09-12 DIAGNOSIS — Z89411 Acquired absence of right great toe: Secondary | ICD-10-CM

## 2016-09-12 DIAGNOSIS — L0291 Cutaneous abscess, unspecified: Secondary | ICD-10-CM

## 2016-09-12 DIAGNOSIS — I10 Essential (primary) hypertension: Secondary | ICD-10-CM | POA: Diagnosis present

## 2016-09-12 LAB — CBC
HCT: 33.4 % — ABNORMAL LOW (ref 39.0–52.0)
HEMOGLOBIN: 10.6 g/dL — AB (ref 13.0–17.0)
MCH: 23.3 pg — ABNORMAL LOW (ref 26.0–34.0)
MCHC: 31.7 g/dL (ref 30.0–36.0)
MCV: 73.4 fL — ABNORMAL LOW (ref 78.0–100.0)
PLATELETS: 292 10*3/uL (ref 150–400)
RBC: 4.55 MIL/uL (ref 4.22–5.81)
RDW: 16.5 % — ABNORMAL HIGH (ref 11.5–15.5)
WBC: 14.8 10*3/uL — AB (ref 4.0–10.5)

## 2016-09-12 LAB — CBG MONITORING, ED
GLUCOSE-CAPILLARY: 376 mg/dL — AB (ref 65–99)
GLUCOSE-CAPILLARY: 353 mg/dL — AB (ref 65–99)
GLUCOSE-CAPILLARY: 242 mg/dL — AB (ref 65–99)

## 2016-09-12 LAB — BASIC METABOLIC PANEL
ANION GAP: 10 (ref 5–15)
BUN: 14 mg/dL (ref 6–20)
CALCIUM: 8.4 mg/dL — AB (ref 8.9–10.3)
CO2: 21 mmol/L — AB (ref 22–32)
CREATININE: 0.99 mg/dL (ref 0.61–1.24)
Chloride: 96 mmol/L — ABNORMAL LOW (ref 101–111)
Glucose, Bld: 395 mg/dL — ABNORMAL HIGH (ref 65–99)
Potassium: 3.4 mmol/L — ABNORMAL LOW (ref 3.5–5.1)
SODIUM: 127 mmol/L — AB (ref 135–145)

## 2016-09-12 LAB — GLUCOSE, CAPILLARY
GLUCOSE-CAPILLARY: 209 mg/dL — AB (ref 65–99)
GLUCOSE-CAPILLARY: 279 mg/dL — AB (ref 65–99)

## 2016-09-12 LAB — MAGNESIUM: Magnesium: 1.8 mg/dL (ref 1.7–2.4)

## 2016-09-12 LAB — I-STAT CG4 LACTIC ACID, ED: Lactic Acid, Venous: 1.12 mmol/L (ref 0.5–1.9)

## 2016-09-12 MED ORDER — SODIUM CHLORIDE 0.9 % IV SOLN: INTRAVENOUS | Status: DC

## 2016-09-12 MED ORDER — HYDROCODONE-ACETAMINOPHEN 5-325 MG PO TABS
1.0000 | ORAL_TABLET | ORAL | Status: DC | PRN
  Administered 2016-09-12 – 2016-09-13 (×3): 2 via ORAL
  Administered 2016-09-13: 1 via ORAL
  Filled 2016-09-12 (×3): qty 2
  Filled 2016-09-12: qty 1

## 2016-09-12 MED ORDER — DOCUSATE SODIUM 100 MG PO CAPS
100.0000 mg | ORAL_CAPSULE | Freq: Two times a day (BID) | ORAL | Status: DC
  Administered 2016-09-12 – 2016-09-15 (×5): 100 mg via ORAL
  Filled 2016-09-12 (×6): qty 1

## 2016-09-12 MED ORDER — ALBUTEROL SULFATE (2.5 MG/3ML) 0.083% IN NEBU: 3.0000 mL | INHALATION_SOLUTION | RESPIRATORY_TRACT | Status: DC | PRN

## 2016-09-12 MED ORDER — INSULIN ASPART 100 UNIT/ML ~~LOC~~ SOLN
0.0000 [IU] | Freq: Every day | SUBCUTANEOUS | Status: DC
  Administered 2016-09-12: 3 [IU] via SUBCUTANEOUS

## 2016-09-12 MED ORDER — POTASSIUM CHLORIDE IN NACL 20-0.9 MEQ/L-% IV SOLN
INTRAVENOUS | Status: DC
  Administered 2016-09-12 – 2016-09-14 (×3): via INTRAVENOUS
  Filled 2016-09-12 (×8): qty 1000

## 2016-09-12 MED ORDER — INSULIN ASPART 100 UNIT/ML ~~LOC~~ SOLN: 0.0000 [IU] | Freq: Three times a day (TID) | SUBCUTANEOUS | Status: DC

## 2016-09-12 MED ORDER — ACETAMINOPHEN 325 MG PO TABS
650.0000 mg | ORAL_TABLET | Freq: Once | ORAL | Status: AC
  Administered 2016-09-12: 650 mg via ORAL
  Filled 2016-09-12: qty 2

## 2016-09-12 MED ORDER — LIDOCAINE-EPINEPHRINE (PF) 2 %-1:200000 IJ SOLN
20.0000 mL | Freq: Once | INTRAMUSCULAR | Status: AC
  Administered 2016-09-12: 20 mL
  Filled 2016-09-12: qty 20

## 2016-09-12 MED ORDER — LISINOPRIL 20 MG PO TABS
20.0000 mg | ORAL_TABLET | Freq: Every day | ORAL | Status: DC
  Administered 2016-09-12 – 2016-09-15 (×4): 20 mg via ORAL
  Filled 2016-09-12 (×4): qty 1

## 2016-09-12 MED ORDER — FERROUS SULFATE 325 (65 FE) MG PO TABS
325.0000 mg | ORAL_TABLET | Freq: Two times a day (BID) | ORAL | Status: DC
  Administered 2016-09-12 – 2016-09-15 (×7): 325 mg via ORAL
  Filled 2016-09-12 (×7): qty 1

## 2016-09-12 MED ORDER — VANCOMYCIN HCL IN DEXTROSE 750-5 MG/150ML-% IV SOLN
750.0000 mg | Freq: Three times a day (TID) | INTRAVENOUS | Status: DC
  Administered 2016-09-12 – 2016-09-14 (×5): 750 mg via INTRAVENOUS
  Filled 2016-09-12 (×8): qty 150

## 2016-09-12 MED ORDER — GABAPENTIN 400 MG PO CAPS
400.0000 mg | ORAL_CAPSULE | Freq: Three times a day (TID) | ORAL | Status: DC
  Administered 2016-09-12 – 2016-09-15 (×10): 400 mg via ORAL
  Filled 2016-09-12 (×10): qty 1

## 2016-09-12 MED ORDER — POTASSIUM CHLORIDE CRYS ER 20 MEQ PO TBCR
60.0000 meq | EXTENDED_RELEASE_TABLET | Freq: Once | ORAL | Status: AC
  Administered 2016-09-12: 60 meq via ORAL
  Filled 2016-09-12: qty 3

## 2016-09-12 MED ORDER — HEPARIN SODIUM (PORCINE) 5000 UNIT/ML IJ SOLN
5000.0000 [IU] | Freq: Three times a day (TID) | INTRAMUSCULAR | Status: DC
  Administered 2016-09-12 – 2016-09-15 (×9): 5000 [IU] via SUBCUTANEOUS
  Filled 2016-09-12 (×10): qty 1

## 2016-09-12 MED ORDER — INSULIN ASPART 100 UNIT/ML ~~LOC~~ SOLN
15.0000 [IU] | Freq: Once | SUBCUTANEOUS | Status: AC
  Administered 2016-09-12: 15 [IU] via INTRAVENOUS
  Filled 2016-09-12: qty 1

## 2016-09-12 MED ORDER — INSULIN GLARGINE 100 UNIT/ML ~~LOC~~ SOLN
45.0000 [IU] | Freq: Every day | SUBCUTANEOUS | Status: DC
  Administered 2016-09-12 – 2016-09-15 (×3): 45 [IU] via SUBCUTANEOUS
  Filled 2016-09-12 (×4): qty 0.45

## 2016-09-12 MED ORDER — ONDANSETRON HCL 4 MG/2ML IJ SOLN: 4.0000 mg | Freq: Four times a day (QID) | INTRAMUSCULAR | Status: DC | PRN

## 2016-09-12 MED ORDER — LACTULOSE 10 GM/15ML PO SOLN
10.0000 g | Freq: Two times a day (BID) | ORAL | Status: DC | PRN
  Filled 2016-09-12: qty 15

## 2016-09-12 MED ORDER — VANCOMYCIN HCL IN DEXTROSE 1-5 GM/200ML-% IV SOLN: 1000.0000 mg | Freq: Three times a day (TID) | INTRAVENOUS | Status: DC

## 2016-09-12 MED ORDER — INSULIN ASPART 100 UNIT/ML ~~LOC~~ SOLN
10.0000 [IU] | Freq: Three times a day (TID) | SUBCUTANEOUS | Status: DC
  Administered 2016-09-13 – 2016-09-14 (×6): 10 [IU] via SUBCUTANEOUS

## 2016-09-12 MED ORDER — ACETAMINOPHEN 650 MG RE SUPP
650.0000 mg | Freq: Four times a day (QID) | RECTAL | Status: DC | PRN
Start: 2016-09-12 — End: 2016-09-15

## 2016-09-12 MED ORDER — ACETAMINOPHEN 325 MG PO TABS
650.0000 mg | ORAL_TABLET | Freq: Four times a day (QID) | ORAL | Status: DC | PRN
  Administered 2016-09-12 – 2016-09-14 (×2): 650 mg via ORAL
  Filled 2016-09-12 (×2): qty 2

## 2016-09-12 MED ORDER — ONDANSETRON HCL 4 MG PO TABS: 4.0000 mg | ORAL_TABLET | Freq: Four times a day (QID) | ORAL | Status: DC | PRN

## 2016-09-12 MED ORDER — INSULIN ASPART 100 UNIT/ML ~~LOC~~ SOLN
0.0000 [IU] | Freq: Three times a day (TID) | SUBCUTANEOUS | Status: DC
  Administered 2016-09-12: 5 [IU] via SUBCUTANEOUS
  Administered 2016-09-13: 3 [IU] via SUBCUTANEOUS
  Administered 2016-09-14: 5 [IU] via SUBCUTANEOUS

## 2016-09-12 MED ORDER — ZOLPIDEM TARTRATE 5 MG PO TABS: 5.0000 mg | ORAL_TABLET | Freq: Every evening | ORAL | Status: DC | PRN

## 2016-09-12 MED ORDER — VANCOMYCIN HCL IN DEXTROSE 1-5 GM/200ML-% IV SOLN
1000.0000 mg | Freq: Once | INTRAVENOUS | Status: AC
  Administered 2016-09-12: 1000 mg via INTRAVENOUS
  Filled 2016-09-12: qty 200

## 2016-09-12 MED ORDER — SODIUM CHLORIDE 0.9 % IV BOLUS (SEPSIS)
1000.0000 mL | Freq: Once | INTRAVENOUS | Status: AC
  Administered 2016-09-12: 1000 mL via INTRAVENOUS

## 2016-09-12 NOTE — H&P (Signed)
History and Physical  MARILYN WING FIE:[id number] DOB: [birth year] DOA: 09/12/2016  Referring physician: [facility] PCP: Arnoldo Morale, MD   Chief Complaint: abscess  HPI: Jeffrey [name] is a 50 y.o. male  pmhx of DM, HTN who presents to the ED today c/o abscess. Pt states that he noticed a "knot" on his right buttocks 4 days ago that has gotten progressively larger and more painful. Pt tried applying antibiotic ointment and a bandaid without relief. Pt also endorese fevers, stating that 3 days ago he took his temp at home and it was 101. Pt has also been having ongoing nausea and vomiting x 2 days. Pt admits to being noncompliant with his medication, last taking his insulin 1 week ago. He also has not been checking his sugars at home. Last BM was yesterday.   Review of Systems: All systems reviewed and apart from history of presenting illness, are negative.  Past Medical History:  Diagnosis Date  . Diabetes mellitus   . Diabetic foot infection ([place]) 03/2016   RT FOOT  . HTN (hypertension)    Past Surgical History:  Procedure Laterality Date  . AMPUTATION Right 04/01/2016   Procedure: Right Great Toe Amputation;  Surgeon: Newt Minion, MD;  Location: [place];  Service: Orthopedics;  Laterality: Right;  . AMPUTATION Right 06/19/2016   Procedure: AMPUTATION SECOND TOE;  Surgeon: Marybelle Killings, MD;  Location: [place];  Service: Orthopedics;  Laterality: Right;  . BACK SURGERY     for abscess   Social History:  reports that he has never smoked. He has never used smokeless tobacco. He reports that he does not drink alcohol or use drugs.   No Known Allergies  Family History  Problem Relation Age of Onset  . Diabetes Mother   . Hypertension Mother   . Diabetes Father   . Heart attack Father   . Diabetes Sister     Prior to Admission medications   Medication Sig Start Date End Date Taking? Authorizing Provider  albuterol (PROVENTIL HFA;VENTOLIN HFA) 108 (90 Base) MCG/ACT inhaler  Inhale 2 puffs into the lungs every 4 (four) hours as needed for wheezing or shortness of breath. 05/09/16  Yes Orpah Greek, MD  butalbital-acetaminophen-caffeine (ESGIC) 50-325-40 MG tablet Take 1 tablet by mouth every 6 (six) hours as needed for headache. 04/13/16  Yes Arnoldo Morale, MD  ferrous sulfate 325 (65 FE) MG tablet Take 1 tablet (325 mg total) by mouth 2 (two) times daily with a meal. 04/03/16  Yes Nishant Dhungel, MD  gabapentin (NEURONTIN) 400 MG capsule Take 1 capsule (400 mg total) by mouth 3 (three) times daily. 03/30/16  Yes Arnoldo Morale, MD  insulin aspart (NOVOLOG) 100 UNIT/ML injection Inject 0-15 Units into the skin 3 (three) times daily with meals. Sliding scale  CBG 70 - 120: 0 units: CBG 121 - 140: 2 units; CBG 140 - 200: 4 units; CBG 201 - 240: 6 units; CBG 240 - 300: 8 units;CBG 300 - 350: 12 units; CBG 351 - 400: 16 units; CBG > 400 : 16 units and notify your  MD 04/03/16  Yes Nishant Dhungel, MD  Insulin Glargine (LANTUS SOLOSTAR) 100 UNIT/ML Solostar Pen Inject 60 Units into the skin daily at 10 pm. 06/21/16  Yes Theodis Blaze, MD  lactulose (CHRONULAC) 10 GM/15ML solution Take 15 mLs (10 g total) by mouth 2 (two) times daily as needed for mild constipation. 02/24/16  Yes Arnoldo Morale, MD  lisinopril (PRINIVIL,ZESTRIL) 20  MG tablet Take 1 tablet (20 mg total) by mouth daily. 04/13/16  Yes Arnoldo Morale, MD  HYDROcodone-acetaminophen (NORCO/VICODIN) 5-325 MG tablet Take 1-2 tablets by mouth every 4 (four) hours as needed for moderate pain. Patient not taking: Reported on 09/12/2016 06/21/16   Theodis Blaze, MD  promethazine (PHENERGAN) 25 MG tablet Take 1 tablet (25 mg total) by mouth every 6 (six) hours as needed for nausea or vomiting. Patient not taking: Reported on 09/12/2016 05/09/16   Orpah Greek, MD  sulfamethoxazole-trimethoprim (BACTRIM DS,SEPTRA DS) 800-160 MG tablet Take 1 tablet by mouth 2 (two) times daily. Patient not taking: Reported on 09/12/2016 06/21/16    Theodis Blaze, MD   Physical Exam: Vitals:   09/12/16 1222 09/12/16 1313 09/12/16 1529 09/12/16 1536  BP:   144/76   Pulse:   89   Resp:   16   Temp:  102.4 F (39.1 C)  100.2 F (37.9 C)  TempSrc:    Oral  SpO2:   93%   Weight: 86.2 kg (190 lb)     Height: 5\' 7"  (1.702 m)       Constitutional: He is oriented to person, place, and time. He appears well-developed and well-nourished. No distress.  HENT: Head: Normocephalic and atraumatic.  Mouth/Throat: No oropharyngeal exudate.  Eyes: Conjunctivae and EOM are normal. Pupils are equal, round, and reactive to light. Right eye exhibits no discharge. Left eye exhibits no discharge. No scleral icterus.  Cardiovascular: Normal rate, regular rhythm, normal heart sounds and intact distal pulses.  Exam reveals no gallop and no friction rub.  No murmur heard. Pulmonary/Chest: Effort normal and breath sounds normal. No respiratory distress. He has no wheezes. He has no rales. He exhibits no tenderness.  Abdominal: Soft. He exhibits no distension. There is no tenderness. There is no guarding.  Musculoskeletal: Normal range of motion. He exhibits no edema.  Neurological: He is alert and oriented to person, place, and time.  Skin: Skin is warm and dry. No rash noted. He is not diaphoretic. No pallor. Large 12x8cm abscess on left buttocks with significant induration and erythema. No streaking. No perirectal induration or tenderness.  Psychiatric: He has a normal mood and affect. His behavior is normal.    Labs on Admission:  Basic Metabolic Panel:  Recent Labs Lab 09/12/16 1236  NA 127*  K 3.4*  CL 96*  CO2 21*  GLUCOSE 395*  BUN 14  CREATININE 0.99  CALCIUM 8.4*  MG 1.8   Liver Function Tests: No results for input(s): AST, ALT, ALKPHOS, BILITOT, PROT, ALBUMIN in the last 168 hours. No results for input(s): LIPASE, AMYLASE in the last 168 hours. No results for input(s): AMMONIA in the last 168 hours. CBC:  Recent Labs Lab  09/12/16 1236  WBC 14.8*  HGB 10.6*  HCT 33.4*  MCV 73.4*  PLT 292   Cardiac Enzymes: No results for input(s): CKTOTAL, CKMB, CKMBINDEX, TROPONINI in the last 168 hours.  BNP (last 3 results) No results for input(s): PROBNP in the last 8760 hours. CBG:  Recent Labs Lab 09/12/16 1222 09/12/16 1549  GLUCAP 376* 353*    Radiological Exams on Admission: No results found.  EKG: Independently reviewed.  Assessment/Plan Principal Problem:   Abscess, gluteal, right Active Problems:   Uncontrolled diabetes mellitus ([place])   Essential hypertension   Uncontrolled type 2 diabetes mellitus with hyperglycemia, with long-term current use of insulin ([facility])   Anemia, iron deficiency   1. Large right gluteal abscess - s/p  I&D in ED, I asked for Gen. surgery to take a look at the abscess to be sure he didn't need further debridement. They recommended continuing to pack the wound and continue antibiotics for the cellulitis treatment. Continue vancomycin for now. Cultures were done during the incision and drainage and are pending. 2. 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 Monitor blood glucose closely. Carbohydrate modified diet. Intensive diabetes education needed as patient is very noncompliant. IV fluid hydration ordered. Monitor potassium as we expect some hypokalemia with improved glycemic control. 3. Essential hypertension-controlled at this time-resume home medications. Follow closely. 4. Iron deficiency anemia-resume home iron supplement therapy.    DVT Prophylaxis: Enoxaparin Code Status: Full  Family Communication: Updated at bedside wife and daughter  Disposition Plan: Home when medically stabilized   Time spent: 26 minutes  Irwin Brakeman, MD [facility] Pager [phone]  If 7PM-7AM, please contact night-coverage [email or web address] Password [email or web address] 09/12/2016,  5:08 PM

## 2016-09-12 NOTE — ED Notes (Addendum)
Pt reminded again of the need for urine

## 2016-09-12 NOTE — Consult Note (Signed)
Reason for Consult: gluteal abscess Referring Physician: Dr Murvin Natal  Jeffrey [name] is an 50 y.o. male.  HPI: Pt with a "knot" on his right buttocks that started 4 days ago that has gotten progressively larger and more painful. Pt tried applying antibiotic ointment and a bandaid without relief. Pt also endorese fevers at home.  Pt has also been having ongoing nausea and vomiting x 2 days. Pt admits to being noncompliant with his medication, last taking his insulin 1 week ago. He also has not been checking his sugars at home.   Past Medical History:  Diagnosis Date  . Diabetes mellitus   . Diabetic foot infection ([place]) 03/2016   RT FOOT  . HTN (hypertension)     Past Surgical History:  Procedure Laterality Date  . AMPUTATION Right 04/01/2016   Procedure: Right Great Toe Amputation;  Surgeon: Newt Minion, MD;  Location: [place];  Service: Orthopedics;  Laterality: Right;  . AMPUTATION Right 06/19/2016   Procedure: AMPUTATION SECOND TOE;  Surgeon: Marybelle Killings, MD;  Location: [place];  Service: Orthopedics;  Laterality: Right;  . BACK SURGERY     for abscess    Family History  Problem Relation Age of Onset  . Diabetes Mother   . Hypertension Mother   . Diabetes Father   . Heart attack Father   . Diabetes Sister     Social History:  reports that he has never smoked. He has never used smokeless tobacco. He reports that he does not drink alcohol or use drugs.  Allergies: No Known Allergies  Medications: I have reviewed the patient's current medications.  Results for orders placed or performed during the hospital encounter of 09/12/16 (from the past 48 hour(s))  CBG monitoring, ED     Status: Abnormal   Collection Time: 09/12/16 12:22 PM  Result Value Ref Range   Glucose-Capillary 376 (H) 65 - 99 mg/dL   Comment 1 Notify RN    Comment 2 Document in Chart   Basic metabolic panel     Status: Abnormal   Collection Time: 09/12/16 12:36 PM  Result Value Ref Range   Sodium 127 (L)  135 - 145 mmol/L   Potassium 3.4 (L) 3.5 - 5.1 mmol/L   Chloride 96 (L) 101 - 111 mmol/L   CO2 21 (L) 22 - 32 mmol/L   Glucose, Bld 395 (H) 65 - 99 mg/dL   BUN 14 6 - 20 mg/dL   Creatinine, Ser 0.99 0.61 - 1.24 mg/dL   Calcium 8.4 (L) 8.9 - 10.3 mg/dL   GFR calc non Af Amer >60 >60 mL/min   GFR calc Af Amer >60 >60 mL/min    Comment: (NOTE) The eGFR has been calculated using the CKD EPI equation. This calculation has not been validated in all clinical situations. eGFR's persistently <60 mL/min signify possible Chronic Kidney Disease.    Anion gap 10 5 - 15  CBC     Status: Abnormal   Collection Time: 09/12/16 12:36 PM  Result Value Ref Range   WBC 14.8 (H) 4.0 - 10.5 K/uL   RBC 4.55 4.22 - 5.81 MIL/uL   Hemoglobin 10.6 (L) 13.0 - 17.0 g/dL   HCT 33.4 (L) 39.0 - 52.0 %   MCV 73.4 (L) 78.0 - 100.0 fL   MCH 23.3 (L) 26.0 - 34.0 pg   MCHC 31.7 30.0 - 36.0 g/dL   RDW 16.5 (H) 11.5 - 15.5 %   Platelets 292 150 - 400 K/uL  Magnesium     Status: None   Collection Time: 09/12/16 12:36 PM  Result Value Ref Range   Magnesium 1.8 1.7 - 2.4 mg/dL  I-Stat CG4 Lactic Acid, ED     Status: None   Collection Time: 09/12/16  1:35 PM  Result Value Ref Range   Lactic Acid, Venous 1.12 0.5 - 1.9 mmol/L  CBG monitoring, ED     Status: Abnormal   Collection Time: 09/12/16  3:49 PM  Result Value Ref Range   Glucose-Capillary 353 (H) 65 - 99 mg/dL    No results found.  Review of Systems  Constitutional: Positive for fever and malaise/fatigue. Negative for chills and weight loss.  Eyes: Negative for blurred vision.  Respiratory: Negative for cough and shortness of breath.   Cardiovascular: Negative for chest pain.  Gastrointestinal: Positive for nausea and vomiting. Negative for abdominal pain, constipation and diarrhea.  Genitourinary: Negative for dysuria, frequency and urgency.  Musculoskeletal: Negative for myalgias and neck pain.  Skin: Negative for rash.  Neurological: Negative for  dizziness and headaches.   Blood pressure 144/76, pulse 89, temperature 100.2 F (37.9 C), temperature source Oral, resp. rate 16, height _0  (1.702 m), weight 86.2 kg (190 lb), SpO2 93 %. Physical Exam  Constitutional: He is oriented to person, place, and time. He appears well-developed and well-nourished.  HENT:  Head: Normocephalic and atraumatic.  Eyes: Conjunctivae and EOM are normal. Pupils are equal, round, and reactive to light.  Neck: Normal range of motion. Neck supple.  Cardiovascular: Normal rate and regular rhythm.   Respiratory: Effort normal and breath sounds normal. No respiratory distress.  GI: Soft. He exhibits no distension. There is no tenderness.  Musculoskeletal: Normal range of motion.  Neurological: He is alert and oriented to person, place, and time.  Skin: Skin is warm and dry.  R buttock with induration, ~5cm incision present    Assessment/Plan: Appears to be well drained by ED.  No further surgical needs identified.  Pack wound BID.  Antibiotics and blood glucose control per primary team.    [name] C. 16/12/958, 4:54 PM

## 2016-09-12 NOTE — ED Provider Notes (Signed)
[facility] DEPT Provider Note   CSN: [id number] Arrival date & time: 09/12/16  1212     History   Chief Complaint Chief Complaint  Patient presents with  . Abscess  . Hyperglycemia    HPI Jeffrey [name] is a 50 y.o. male with a pmhx of DM, HTN who presents to the ED today c/o abscess. Pt states that he noticed a "knot" on his right buttocks 4 days ago that has gotten progressively larger and more painful. Pt tried applying antibiotic ointment and a bandaid without relief. Pt also endorese fevers, stating that 3 days ago he took his temp at home and it was 101. Pt has also been having ongoing nausea and vomiting x 2 days. Pt admits to being noncompliant with his medication, last taking his insulin 1 week ago. He also has not been checking his sugars at home. Last BM was yesterday.   HPI  Past Medical History:  Diagnosis Date  . Diabetes mellitus   . Diabetic foot infection ([place]) 03/2016   RT FOOT  . HTN (hypertension)     Patient Active Problem List   Diagnosis Date Noted  . Folliculitis 95/08/3266  . Hyponatremia 06/18/2016  . Hypokalemia 06/18/2016  . 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 Anemia, iron deficiency   . Noncompliance with medication regimen 03/30/2016  . Uncontrolled type 2 diabetes mellitus with hyperglycemia, with long-term current use of insulin ([place])   . Cellulitis of right foot 02/19/2016  . Diabetic foot infection ([place]) 02/18/2016  . Cellulitis 09/25/2015  . Cellulitis of back   . Essential hypertension 08/21/2013  . Diabetic neuropathy ([place]) 08/21/2013  . Blurry vision 08/21/2013  . Fungal toenail infection 08/21/2013    Past Surgical History:  Procedure Laterality Date  . AMPUTATION Right 04/01/2016   Procedure: Right Great Toe Amputation;  Surgeon: Newt Minion, MD;  Location: [facility];  Service: Orthopedics;  Laterality: Right;  . AMPUTATION Right 06/19/2016   Procedure: AMPUTATION SECOND TOE;   Surgeon: Marybelle Killings, MD;  Location: [facility];  Service: Orthopedics;  Laterality: Right;  . BACK SURGERY     for abscess       Home Medications    Prior to Admission medications   Medication Sig Start Date End Date Taking? Authorizing Provider  albuterol (PROVENTIL HFA;VENTOLIN HFA) 108 (90 Base) MCG/ACT inhaler Inhale 2 puffs into the lungs every 4 (four) hours as needed for wheezing or shortness of breath. 05/09/16  Yes Orpah Greek, MD  butalbital-acetaminophen-caffeine (ESGIC) 50-325-40 MG tablet Take 1 tablet by mouth every 6 (six) hours as needed for headache. 04/13/16  Yes Arnoldo Morale, MD  ferrous sulfate 325 (65 FE) MG tablet Take 1 tablet (325 mg total) by mouth 2 (two) times daily with a meal. 04/03/16  Yes Nishant Dhungel, MD  gabapentin (NEURONTIN) 400 MG capsule Take 1 capsule (400 mg total) by mouth 3 (three) times daily. 03/30/16  Yes Arnoldo Morale, MD  insulin aspart (NOVOLOG) 100 UNIT/ML injection Inject 0-15 Units into the skin 3 (three) times daily with meals. Sliding scale  CBG 70 - 120: 0 units: CBG 121 - 140: 2 units; CBG 140 - 200: 4 units; CBG 201 - 240: 6 units; CBG 240 - 300: 8 units;CBG 300 - 350: 12 units; CBG 351 - 400: 16 units; CBG > 400 : 16 units and notify your  MD 04/03/16  Yes Nishant Dhungel, MD  Insulin Glargine (LANTUS SOLOSTAR) 100 UNIT/ML Solostar  Pen Inject 60 Units into the skin daily at 10 pm. 06/21/16  Yes Theodis Blaze, MD  lactulose (CHRONULAC) 10 GM/15ML solution Take 15 mLs (10 g total) by mouth 2 (two) times daily as needed for mild constipation. 02/24/16  Yes Arnoldo Morale, MD  lisinopril (PRINIVIL,ZESTRIL) 20 MG tablet Take 1 tablet (20 mg total) by mouth daily. 04/13/16  Yes Arnoldo Morale, MD  HYDROcodone-acetaminophen (NORCO/VICODIN) 5-325 MG tablet Take 1-2 tablets by mouth every 4 (four) hours as needed for moderate pain. Patient not taking: Reported on 09/12/2016 06/21/16   Theodis Blaze, MD  promethazine (PHENERGAN) 25 MG tablet Take 1 tablet  (25 mg total) by mouth every 6 (six) hours as needed for nausea or vomiting. Patient not taking: Reported on 09/12/2016 05/09/16   Orpah Greek, MD  sulfamethoxazole-trimethoprim (BACTRIM DS,SEPTRA DS) 800-160 MG tablet Take 1 tablet by mouth 2 (two) times daily. Patient not taking: Reported on 09/12/2016 06/21/16   Theodis Blaze, MD    Family History Family History  Problem Relation Age of Onset  . Diabetes Mother   . Hypertension Mother   . Diabetes Father   . Heart attack Father   . Diabetes Sister     Social History Social History  Substance Use Topics  . Smoking status: Never Smoker  . Smokeless tobacco: Never Used  . Alcohol use No     Allergies   Review of patient's allergies indicates no known allergies.   Review of Systems Review of Systems  All other systems reviewed and are negative.    Physical Exam Updated Vital Signs BP 158/79 (BP Location: Left Arm)   Pulse 100   Temp 102.4 F (39.1 C)   Resp 18   Ht 5\' 7"  (1.702 m)   Wt 86.2 kg   SpO2 98%   BMI 29.76 kg/m   Physical Exam  Constitutional: He is oriented to person, place, and time. He appears well-developed and well-nourished. No distress.  HENT:  Head: Normocephalic and atraumatic.  Mouth/Throat: No oropharyngeal exudate.  Eyes: Conjunctivae and EOM are normal. Pupils are equal, round, and reactive to light. Right eye exhibits no discharge. Left eye exhibits no discharge. No scleral icterus.  Cardiovascular: Normal rate, regular rhythm, normal heart sounds and intact distal pulses.  Exam reveals no gallop and no friction rub.   No murmur heard. Pulmonary/Chest: Effort normal and breath sounds normal. No respiratory distress. He has no wheezes. He has no rales. He exhibits no tenderness.  Abdominal: Soft. He exhibits no distension. There is no tenderness. There is no guarding.  Musculoskeletal: Normal range of motion. He exhibits no edema.  Neurological: He is alert and oriented to person,  place, and time.  Skin: Skin is warm and dry. No rash noted. He is not diaphoretic. No pallor.  Large 12x8cm abscess on left buttocks with significant induration and erythema. No streaking. No perirectal induration or tenderness.  Psychiatric: He has a normal mood and affect. His behavior is normal.  Nursing note and vitals reviewed.    ED Treatments / Results  Labs (all labs ordered are listed, but only abnormal results are displayed) Labs Reviewed  BASIC METABOLIC PANEL - Abnormal; Notable for the following:       Result Value   Sodium 127 (*)    Potassium 3.4 (*)    Chloride 96 (*)    CO2 21 (*)    Glucose, Bld 395 (*)    Calcium 8.4 (*)    All other  components within normal limits  CBC - Abnormal; Notable for the following:    WBC 14.8 (*)    Hemoglobin 10.6 (*)    HCT 33.4 (*)    MCV 73.4 (*)    MCH 23.3 (*)    RDW 16.5 (*)    All other components within normal limits  CBG MONITORING, ED - Abnormal; Notable for the following:    Glucose-Capillary 376 (*)    All other components within normal limits  CULTURE, BLOOD (ROUTINE X 2)  CULTURE, BLOOD (ROUTINE X 2)  URINALYSIS, ROUTINE W REFLEX MICROSCOPIC (NOT AT [facility])  I-STAT CG4 LACTIC ACID, ED    EKG  EKG Interpretation None       Radiology No results found.  Procedures Procedures (including critical care time)  Medications Ordered in ED Medications  vancomycin (VANCOCIN) IVPB 1000 mg/200 mL premix (1,000 mg Intravenous New Bag/Given 09/12/16 1419)  sodium chloride 0.9 % bolus 1,000 mL (1,000 mLs Intravenous New Bag/Given 09/12/16 1406)  acetaminophen (TYLENOL) tablet 650 mg (650 mg Oral Given 09/12/16 1406)  lidocaine-EPINEPHrine (XYLOCAINE W/EPI) 2 %-1:200000 (PF) injection 20 mL (20 mLs Infiltration Given by Other 09/12/16 1424)     Initial Impression / Assessment and Plan / ED Course  I have reviewed the triage vital signs and the nursing notes.  Pertinent labs & imaging results that were available  during my care of the patient were reviewed by me and considered in my medical decision making (see chart for details).  Clinical Course   50 year old male with past medical history of diabetes presents to the ED today with large abscess to right buttock. Abscesses approximately 12 cm right a centimeter and a severely indurated with surrounding erythema. Patient is febrile to 102.4 on presentation to the ED. No tachycardia. Patient is normotensive. Code sepsis was not called. No lactic acidosis. Patient is given IV fluids. Elevated glucose at 395. No sign of DKA. No anion gap. Corrected sodium is 134. An abscess incision and drainage attempted in the ED by myself with copious amounts of discharge. However, abscesses severely loculated and indurated and will likely need further debridement and the operating room by general surgery. Wound culture collected and sent. Patient was given IV vancomycin. Spoke with Dr. Wynetta Emery with hospitalist service who will admit patient. Given IV insulin per his recommendations. Spoke with Opal Sidles with general surgery who will consult patient.  Patient was discussed with and seen by Dr. Ashok Cordia who agrees with the treatment plan.    Final Clinical Impressions(s) / ED Diagnoses   Final diagnoses:  Abscess  Hyperglycemia    New Prescriptions New Prescriptions   No medications on file     Carlos Levering, PA-C 09/12/16 [place], MD 09/13/16 1500

## 2016-09-12 NOTE — Progress Notes (Addendum)
Pharmacy Antibiotic Note  Jeffrey [name] is a 50 y.o. male admitted on 09/12/2016 with large right gluteal abscess s/p I&D in the ED. Pharmacy has been consulted for Vancomycin dosing.  Plan: Vancomycin 1g IV x 1 given in the ED. Continue with Vancomycin 750mg  IV q8h per previous level hx/dosing/renal function. Plan for Vancomycin trough level at steady state. Monitor renal function, cultures, clinical course.  Height: 5\' 7"  (170.2 cm) Weight: 190 lb (86.2 kg) IBW/kg (Calculated) : 66.1  Temp (24hrs), Avg:100.9 F (38.3 C), Min:100.1 F (37.8 C), Max:102.4 F (39.1 C)   Recent Labs Lab 09/12/16 1236 09/12/16 1335  WBC 14.8*  --   CREATININE 0.99  --   LATICACIDVEN  --  1.12    Estimated Creatinine Clearance: 92.5 mL/min (by C-G formula based on SCr of 0.99 mg/dL).    No Known Allergies  Antimicrobials this admission: 10/2 >> Vancomycin >>  Dose adjustments this admission: --  Microbiology results: 10/2 BCx: sent 10/2 Wound: sent  Thank you for allowing pharmacy to be a part of this patient's care.   Lindell Spar, PharmD, BCPS Pager: [phone] 09/12/2016 5:26 PM

## 2016-09-12 NOTE — ED Notes (Signed)
PA at bedside.

## 2016-09-12 NOTE — ED Triage Notes (Signed)
Pt complaint of hyperglycemia and abscess to right buttock since Saturday; hx of same.

## 2016-09-12 NOTE — ED Notes (Signed)
Pt still unable to urinate still

## 2016-09-12 NOTE — ED Notes (Signed)
Hospitalist at bedside 

## 2016-09-12 NOTE — ED Notes (Signed)
Gave pt urinal and reminded of the need for urine

## 2016-09-13 LAB — CBC
HCT: 29.4 % — ABNORMAL LOW (ref 39.0–52.0)
Hemoglobin: 9.3 g/dL — ABNORMAL LOW (ref 13.0–17.0)
MCH: 23.4 pg — AB (ref 26.0–34.0)
MCHC: 31.6 g/dL (ref 30.0–36.0)
MCV: 74.1 fL — AB (ref 78.0–100.0)
PLATELETS: 296 10*3/uL (ref 150–400)
RBC: 3.97 MIL/uL — ABNORMAL LOW (ref 4.22–5.81)
RDW: 16.7 % — AB (ref 11.5–15.5)
WBC: 13.7 10*3/uL — ABNORMAL HIGH (ref 4.0–10.5)

## 2016-09-13 LAB — COMPREHENSIVE METABOLIC PANEL
ALT: 11 U/L — AB (ref 17–63)
AST: 14 U/L — AB (ref 15–41)
Albumin: 2.5 g/dL — ABNORMAL LOW (ref 3.5–5.0)
Alkaline Phosphatase: 98 U/L (ref 38–126)
Anion gap: 5 (ref 5–15)
BUN: 13 mg/dL (ref 6–20)
CHLORIDE: 105 mmol/L (ref 101–111)
CO2: 23 mmol/L (ref 22–32)
CREATININE: 0.93 mg/dL (ref 0.61–1.24)
Calcium: 7.8 mg/dL — ABNORMAL LOW (ref 8.9–10.3)
GFR calc Af Amer: >60 mL/min (ref >60)
GFR calc non Af Amer: >60 mL/min (ref >60)
Glucose, Bld: 198 mg/dL — ABNORMAL HIGH (ref 65–99)
Potassium: 4.2 mmol/L (ref 3.5–5.1)
SODIUM: 133 mmol/L — AB (ref 135–145)
Total Bilirubin: 0.5 mg/dL (ref 0.3–1.2)
Total Protein: 6.6 g/dL (ref 6.5–8.1)

## 2016-09-13 LAB — URINE MICROSCOPIC-ADD ON

## 2016-09-13 LAB — GLUCOSE, CAPILLARY
GLUCOSE-CAPILLARY: 174 mg/dL — AB (ref 65–99)
GLUCOSE-CAPILLARY: 90 mg/dL (ref 65–99)
GLUCOSE-CAPILLARY: 82 mg/dL (ref 65–99)
Glucose-Capillary: 110 mg/dL — ABNORMAL HIGH (ref 65–99)

## 2016-09-13 LAB — URINALYSIS, ROUTINE W REFLEX MICROSCOPIC
GLUCOSE, UA: 250 mg/dL — AB
Ketones, ur: NEGATIVE mg/dL
LEUKOCYTES UA: NEGATIVE
NITRITE: NEGATIVE
PH: 6 (ref 5.0–8.0)
PROTEIN: 100 mg/dL — AB
Specific Gravity, Urine: 1.035 — ABNORMAL HIGH (ref 1.005–1.030)

## 2016-09-13 LAB — HEMOGLOBIN A1C
Hgb A1c MFr Bld: 12.4 % — ABNORMAL HIGH (ref 4.8–5.6)
Mean Plasma Glucose: 309 mg/dL

## 2016-09-13 MED ORDER — TRAMADOL HCL 50 MG PO TABS
100.0000 mg | ORAL_TABLET | Freq: Four times a day (QID) | ORAL | Status: DC | PRN
  Administered 2016-09-14: 50 mg via ORAL
  Filled 2016-09-13: qty 2

## 2016-09-13 NOTE — Progress Notes (Signed)
PROGRESS NOTE    Jeffrey [name]  KZS:[id number] DOB: 1966-09-27 DOA: 09/12/2016 PCP: Arnoldo Morale, MD    Brief Narrative: Jeffrey [name] is a 50 y.o. male pmhx of DM, HTN who presents to the ED today c/o abscess in the right abscess.  Assessment & Plan:   Principal Problem:   Abscess, gluteal, right Active Problems:   Essential hypertension   Uncontrolled type 2 diabetes mellitus with hyperglycemia, with long-term current use of insulin (HCC)   Anemia, iron deficiency   Uncontrolled diabetes mellitus ([place])  Right gluteal abscess:  S/p I&D on 10/2.  Still febrile, reports persistent soreness.  Improving leukocytosis.  cutlures pending.  Resume IV antibiotics.  Continue with wet to dry dressings twice daily.    Uncontrolled Diabetes Mellitus: hgba1c is 12. 4.  CBG (last 3)   Recent Labs  09/13/16 0744 09/13/16 1239 09/13/16 1648  GLUCAP 174* 90 110*    Resume SSI and lantus.    Hypertension: Controlled.   Iron deficiency anemia: Monitor renal parameters.    DVT prophylaxis: (heparin) Code Status: full code.  Family Communication: none at bedside.  Disposition Plan: pending further evaluation.    Consultants:   Surgery.    Procedures: I&D on 10/2   Antimicrobials:  IV vancomycin  Subjective: Sore at the abscess.   Objective: Vitals:   09/12/16 1840 09/13/16 0308 09/13/16 0604 09/13/16 1500  BP: 135/76 120/70 112/73 123/61  Pulse: 94 85 91 (!) 101  Resp: 18 18 18 20   Temp: (!) 102.2 F (39 C) 98.6 F (37 C) 98.9 F (37.2 C) (!) 100.8 F (38.2 C)  TempSrc: Oral Oral Oral Oral  SpO2: 99% 96% 98% 100%  Weight: 85.3 kg (188 lb)     Height: 5\' 7"  (1.702 m)       Intake/Output Summary (Last 24 hours) at 09/13/16 1843 Last data filed at 09/13/16 0900  Gross per 24 hour  Intake              240 ml  Output              400 ml  Net             -160 ml   Filed Weights   09/12/16 1222 09/12/16 1840  Weight: 86.2 kg (190 lb) 85.3  kg (188 lb)    Examination:  General exam: Appears calm and comfortable  Respiratory system: Clear to auscultation. Respiratory effort normal. Cardiovascular system: S1 & S2 heard, RRR. No JVD, murmurs, rubs, gallops or clicks. No pedal edema. Gastrointestinal system: Abdomen is nondistended, soft and nontender. No organomegaly or masses felt. Normal bowel sounds heard. Central nervous system: Alert and oriented. No focal neurological deficits. Extremities: Symmetric 5 x 5 power. Skin: right buttock abscess, bandaged.  Psychiatry: Judgement and insight appear normal. Mood & affect appropriate.     Data Reviewed: I have personally reviewed following labs and imaging studies  CBC:  Recent Labs Lab 09/12/16 1236 09/13/16 0542  WBC 14.8* 13.7*  HGB 10.6* 9.3*  HCT 33.4* 29.4*  MCV 73.4* 74.1*  PLT 292 732   Basic Metabolic Panel:  Recent Labs Lab 09/12/16 1236 09/13/16 0542  NA 127* 133*  K 3.4* 4.2  CL 96* 105  CO2 21* 23  GLUCOSE 395* 198*  BUN 14 13  CREATININE 0.99 0.93  CALCIUM 8.4* 7.8*  MG 1.8  --    GFR: Estimated Creatinine Clearance: 98.1 mL/min (by C-G formula based on SCr of 0.93 mg/dL).  Liver Function Tests:  Recent Labs Lab 09/13/16 0542  AST 14*  ALT 11*  ALKPHOS 98  BILITOT 0.5  PROT 6.6  ALBUMIN 2.5*   No results for input(s): LIPASE, AMYLASE in the last 168 hours. No results for input(s): AMMONIA in the last 168 hours. Coagulation Profile: No results for input(s): INR, PROTIME in the last 168 hours. Cardiac Enzymes: No results for input(s): CKTOTAL, CKMB, CKMBINDEX, TROPONINI in the last 168 hours. BNP (last 3 results) No results for input(s): PROBNP in the last 8760 hours. HbA1C:  Recent Labs  09/12/16 1236  HGBA1C 12.4*   CBG:  Recent Labs Lab 09/12/16 1825 09/12/16 2149 09/13/16 0744 09/13/16 1239 09/13/16 1648  GLUCAP 209* 279* 174* 90 110*   Lipid Profile: No results for input(s): CHOL, HDL, LDLCALC, TRIG,  CHOLHDL, LDLDIRECT in the last 72 hours. Thyroid Function Tests: No results for input(s): TSH, T4TOTAL, FREET4, T3FREE, THYROIDAB in the last 72 hours. Anemia Panel: No results for input(s): VITAMINB12, FOLATE, FERRITIN, TIBC, IRON, RETICCTPCT in the last 72 hours. Sepsis Labs:  Recent Labs Lab 09/12/16 1335  LATICACIDVEN 1.12    Recent Results (from the past 240 hour(s))  Culture, blood (routine x 2)     Status: None (Preliminary result)   Collection Time: 09/12/16  2:15 PM  Result Value Ref Range Status   Specimen Description BLOOD RIGHT HAND  Final   Special Requests BOTTLES DRAWN AEROBIC AND ANAEROBIC 5CC  Final   Culture   Final    NO GROWTH < 24 HOURS Performed at [facility]    Report Status PENDING  Incomplete  Culture, blood (routine x 2)     Status: None (Preliminary result)   Collection Time: 09/12/16  2:30 PM  Result Value Ref Range Status   Specimen Description BLOOD LEFT ARM  Final   Special Requests BOTTLES DRAWN AEROBIC AND ANAEROBIC 5CC  Final   Culture   Final    NO GROWTH < 24 HOURS Performed at [facility]    Report Status PENDING  Incomplete  Wound or Superficial Culture     Status: None (Preliminary result)   Collection Time: 09/12/16  3:30 PM  Result Value Ref Range Status   Specimen Description ABSCESS  Final   Special Requests RIGHT GLUTEAL ABSCESS,ONLY AEROBIC SWAB SENT  Final   Gram Stain   Final    RARE WBC PRESENT,BOTH PMN AND MONONUCLEAR FEW GRAM POSITIVE COCCI IN PAIRS IN CLUSTERS    Culture ABUNDANT STAPHYLOCOCCUS AUREUS  Final   Report Status PENDING  Incomplete         Radiology Studies: No results found.      Scheduled Meds: . docusate sodium  100 mg Oral BID  . ferrous sulfate  325 mg Oral BID WC  . gabapentin  400 mg Oral TID  . heparin  5,000 Units Subcutaneous Q8H  . insulin aspart  0-15 Units Subcutaneous TID WC  . insulin aspart  0-5 Units Subcutaneous QHS  . insulin aspart  10 Units Subcutaneous  TID WC  . insulin glargine  45 Units Subcutaneous Q2200  . lisinopril  20 mg Oral Daily  . vancomycin  750 mg Intravenous Q8H   Continuous Infusions: . 0.9 % NaCl with KCl 20 mEq / L 150 mL/hr at 09/13/16 1110     LOS: 1 day    Time spent: 25 minutes.     Hosie Poisson, MD [facility] Pager [phone]  If 7PM-7AM, please contact night-coverage [email or web address] Password [email or web address]  09/13/2016, 6:43 PM

## 2016-09-13 NOTE — Progress Notes (Signed)
[facility] Progress Note     Subjective: Pain controlled, mild soreness over right buttock. Denies fever, chills, nausea, or vomiting. Reports having his dressing changed once this morning.   Objective: Vital signs in last 24 hours: Temp:  [98.6 F (37 C)-102.4 F (39.1 C)] 98.9 F (37.2 C) (10/03 0604) Pulse Rate:  [85-100] 91 (10/03 0604) Resp:  [15-18] 18 (10/03 0604) BP: (111-158)/(63-79) 112/73 (10/03 0604) SpO2:  [93 %-99 %] 98 % (10/03 0604) Weight:  [188 lb (85.3 kg)-190 lb (86.2 kg)] 188 lb (85.3 kg) (10/02 1840) Last BM Date: 09/13/16  Intake/Output from previous day: 10/02 0701 - 10/03 0700 In: 1200 [IV Piggyback:1200] Out: -  Intake/Output this shift: Total I/O In: 240 [P.O.:240] Out: 400 [Urine:400]  PE: Gen:  Alert, NAD, pleasant and cooperative Right gluteal abscess: gauze dressing Saturday with serosanguinous output. Still with surrounding erythema and induration ~ 5cm.   Lab Results:   Recent Labs  09/12/16 1236 09/13/16 0542  WBC 14.8* 13.7*  HGB 10.6* 9.3*  HCT 33.4* 29.4*  PLT 292 296   BMET  Recent Labs  09/12/16 1236 09/13/16 0542  NA 127* 133*  K 3.4* 4.2  CL 96* 105  CO2 21* 23  GLUCOSE 395* 198*  BUN 14 13  CREATININE 0.99 0.93  CALCIUM 8.4* 7.8*   CMP     Component Value Date/Time   NA 133 (L) 09/13/2016 0542   K 4.2 09/13/2016 0542   CL 105 09/13/2016 0542   CO2 23 09/13/2016 0542   GLUCOSE 198 (H) 09/13/2016 0542   BUN 13 09/13/2016 0542   CREATININE 0.93 09/13/2016 0542   CREATININE 0.89 06/24/2016 1615   CALCIUM 7.8 (L) 09/13/2016 0542   PROT 6.6 09/13/2016 0542   ALBUMIN 2.5 (L) 09/13/2016 0542   AST 14 (L) 09/13/2016 0542   ALT 11 (L) 09/13/2016 0542   ALKPHOS 98 09/13/2016 0542   BILITOT 0.5 09/13/2016 0542   GFRNONAA >60 09/13/2016 0542   GFRNONAA >89 06/24/2016 1615   GFRAA >60 09/13/2016 0542   GFRAA >89 06/24/2016 1615   Anti-infectives: Anti-infectives    Start     Dose/Rate Route  Frequency Ordered Stop   09/12/16 2200  vancomycin (VANCOCIN) IVPB 1000 mg/200 mL premix  Status:  Discontinued     1,000 mg 200 mL/hr over 60 Minutes Intravenous Every 8 hours 09/12/16 1723 09/12/16 1732   09/12/16 2200  vancomycin (VANCOCIN) IVPB 750 mg/150 ml premix     750 mg 150 mL/hr over 60 Minutes Intravenous Every 8 hours 09/12/16 1732     09/12/16 1400  vancomycin (VANCOCIN) IVPB 1000 mg/200 mL premix     1,000 mg 200 mL/hr over 60 Minutes Intravenous  Once 09/12/16 1358 09/12/16 1523     Assessment/Plan Right gluteal abscess S/p I&D in ED 09/12/16 - afebrile, pain controlled - WBC trending down on abx - cultures pending: currently growing gram positive cocci - continue BID wet-to-dry dressing changes  Plan: continue local wound care as above and direct antibiotic treatment according to wound cultures. I will arrange to change the patient's dressing myself and evaluate the wound this afternoon or tomorrow AM. We will follow.  At discharge: Outpatient follow-up with PCP for re-evaluation of wound in 1-2 weeks. If PCP is unable to evaluate the wound, the patient can follow up in our office with Dr. Marcello Moores in 1-2 weeks. Consult to case management for home health nursing for dressing changes/wound care. Patient may remove dressing, shower with mild  soap and water, and replace a clean wet-to-dry dressing afterwards.    LOS: 1 day    [facility] 09/13/2016, 11:09 AM Pager: [phone] Consults: [phone] Mon-Fri 7:00 am-4:30 pm Sat-Sun 7:00 am-11:30 am

## 2016-09-13 NOTE — Progress Notes (Signed)
Initial Nutrition Assessment  DOCUMENTATION CODES:   Not applicable  INTERVENTION:  Reinforced importance of choosing adequate protein with meals and snacks.   RD will continue to monitor intake. Will f/u on 10/5 for DM education.  NUTRITION DIAGNOSIS:   Food and nutrition related knowledge deficit related to limited prior education as evidenced by per patient/family report.  GOAL:   Patient will meet greater than or equal to 90% of their needs  MONITOR:   PO intake, Labs, Weight trends, I & O's  REASON FOR ASSESSMENT:   Malnutrition Screening Tool    ASSESSMENT:   50 y.o. Male with pmhx of Dm, HTN who presented to the ED with c/o abscess. Pt also endorses fevers. Pt has also been having ongoing nausea and vomiting x 2 days. Pt admits to being noncompliant with his medication, last taking his insulin 1 week ago. He has also not been checking his sugars at home.   S/p I/D for abscess on R buttocks in ED.   Patient reports his appetite and intake was only decreased 1-2 days PTA and that he is feeling better now and eating well. He also was experiencing N/V PTA but that has improved. He reports that at home he does not eat regular meals. Open to DM education, just not today. Pt reports UBW is 192 lbs and he has not noticed any weight changes. Per chart, weight seems to fluctuate between 188-197 lbs.   Meal Completion: 95-100%  Medications reviewed and include: Colace, ferrous sulfate 325 mg, Novolog sliding scale TID with meals and before bed, Novolog 10 units TID with meals, Lantus 45 units daily, NS @ 150 ml/hr.   Labs reviewed: CBG 90-352, Sodium 133, HgbA1c 12.4.   Nutrition-Focused physical exam completed. Findings are no fat depletion, no muscle depletion, and no edema.   Discussed plan with RN. Patient will be here for a few days - plan for DM education on 10/5.   Diet Order:  Diet heart healthy/carb modified Room service appropriate? Yes; Fluid consistency:  Thin  Skin:  Wound (see comment) (Closed incisions R buttocks and R foot)  Last BM:  09/13/2016  Height:   Ht Readings from Last 1 Encounters:  09/12/16 5\' 7"  (1.702 m)    Weight:   Wt Readings from Last 1 Encounters:  09/12/16 188 lb (85.3 kg)    Ideal Body Weight:  67.27 kg  BMI:  Body mass index is 29.44 kg/m.  Estimated Nutritional Needs:   Kcal:  2000-2200  Protein:  100-115 grams  Fluid:  2-2.2 L/day  EDUCATION NEEDS:   Education needs no appropriate at this time  Willey Blade, MS, RD, LDN Pager: [phone] After Hours Pager: [phone]

## 2016-09-14 DIAGNOSIS — E1165 Type 2 diabetes mellitus with hyperglycemia: Secondary | ICD-10-CM

## 2016-09-14 DIAGNOSIS — L0231 Cutaneous abscess of buttock: Principal | ICD-10-CM

## 2016-09-14 DIAGNOSIS — I10 Essential (primary) hypertension: Secondary | ICD-10-CM

## 2016-09-14 DIAGNOSIS — D509 Iron deficiency anemia, unspecified: Secondary | ICD-10-CM

## 2016-09-14 DIAGNOSIS — Z794 Long term (current) use of insulin: Secondary | ICD-10-CM

## 2016-09-14 LAB — BASIC METABOLIC PANEL
Anion gap: 4 — ABNORMAL LOW (ref 5–15)
BUN: 13 mg/dL (ref 6–20)
CHLORIDE: 106 mmol/L (ref 101–111)
CO2: 22 mmol/L (ref 22–32)
Calcium: 7.8 mg/dL — ABNORMAL LOW (ref 8.9–10.3)
Creatinine, Ser: 0.94 mg/dL (ref 0.61–1.24)
GFR calc non Af Amer: >60 mL/min (ref >60)
Glucose, Bld: 216 mg/dL — ABNORMAL HIGH (ref 65–99)
POTASSIUM: 3.9 mmol/L (ref 3.5–5.1)
SODIUM: 132 mmol/L — AB (ref 135–145)

## 2016-09-14 LAB — CBC
HEMATOCRIT: 29.6 % — AB (ref 39.0–52.0)
Hemoglobin: 9.2 g/dL — ABNORMAL LOW (ref 13.0–17.0)
MCH: 23.1 pg — ABNORMAL LOW (ref 26.0–34.0)
MCHC: 31.1 g/dL (ref 30.0–36.0)
MCV: 74.2 fL — AB (ref 78.0–100.0)
Platelets: 322 10*3/uL (ref 150–400)
RBC: 3.99 MIL/uL — AB (ref 4.22–5.81)
RDW: 17.2 % — ABNORMAL HIGH (ref 11.5–15.5)
WBC: 9.5 10*3/uL (ref 4.0–10.5)

## 2016-09-14 LAB — GLUCOSE, CAPILLARY
GLUCOSE-CAPILLARY: 97 mg/dL (ref 65–99)
GLUCOSE-CAPILLARY: 79 mg/dL (ref 65–99)
Glucose-Capillary: 109 mg/dL — ABNORMAL HIGH (ref 65–99)
Glucose-Capillary: 218 mg/dL — ABNORMAL HIGH (ref 65–99)

## 2016-09-14 MED ORDER — SACCHAROMYCES BOULARDII 250 MG PO CAPS
250.0000 mg | ORAL_CAPSULE | Freq: Two times a day (BID) | ORAL | Status: DC
  Administered 2016-09-14 – 2016-09-15 (×3): 250 mg via ORAL
  Filled 2016-09-14 (×3): qty 1

## 2016-09-14 MED ORDER — CLINDAMYCIN HCL 300 MG PO CAPS
300.0000 mg | ORAL_CAPSULE | Freq: Three times a day (TID) | ORAL | Status: DC
  Administered 2016-09-14 – 2016-09-15 (×4): 300 mg via ORAL
  Filled 2016-09-14 (×5): qty 1

## 2016-09-14 NOTE — Progress Notes (Signed)
[facility] PROGRESS NOTE  Jeffrey [name] JME:[id number] DOB: 1966-04-28 DOA: 09/12/2016  PCP: Arnoldo Morale, MD  Brief History/Interval Summary: 50 year old African-American male with a past medical history of diabetes which is poorly controlled, hypertension, presented with complaints of pain in his right buttock area. He was found to have a gluteal abscess. He was hospitalized for further management.  Reason for Visit: Right gluteal abscess  Consultants: Gen. surgery  Procedures: Incision and drainage of the right gluteal abscess on 10/2  Antibiotics: Vancomycin  Subjective/Interval History: Patient continues to have pain in his buttock area, although he does feel better. Denies any abdominal pain, nausea or vomiting.  ROS: Denies any chest pain or shortness of breath  Objective:  Vital Signs  Vitals:   09/13/16 1500 09/13/16 2031 09/14/16 0409 09/14/16 0742  BP: 123/61 116/71 138/89   Pulse: (!) 101 94 87   Resp: 20 18 20    Temp: (!) 100.8 F (38.2 C) 99.7 F (37.6 C) 100.3 F (37.9 C) 99 F (37.2 C)  TempSrc: Oral Oral Oral Oral  SpO2: 100% 98% 97%   Weight:      Height:        Intake/Output Summary (Last 24 hours) at 09/14/16 1312 Last data filed at 09/14/16 0527  Gross per 24 hour  Intake             2765 ml  Output              900 ml  Net             1865 ml   Filed Weights   09/12/16 1222 09/12/16 1840  Weight: 86.2 kg (190 lb) 85.3 kg (188 lb)    General appearance: alert, cooperative, appears stated age and no distress Resp: clear to auscultation bilaterally Cardio: regular rate and rhythm, S1, S2 normal, no murmur, click, rub or gallop GI: soft, non-tender; bowel sounds normal; no masses,  no organomegaly Extremities: Right buttock area covered with dressing Neurologic: No focal deficits  Lab Results:  Data Reviewed: I have personally reviewed following labs and imaging studies  CBC:  Recent Labs Lab 09/12/16 1236  09/13/16 0542 09/14/16 0601  WBC 14.8* 13.7* 9.5  HGB 10.6* 9.3* 9.2*  HCT 33.4* 29.4* 29.6*  MCV 73.4* 74.1* 74.2*  PLT 292 296 229    Basic Metabolic Panel:  Recent Labs Lab 09/12/16 1236 09/13/16 0542 09/14/16 0601  NA 127* 133* 132*  K 3.4* 4.2 3.9  CL 96* 105 106  CO2 21* 23 22  GLUCOSE 395* 198* 216*  BUN 14 13 13   CREATININE 0.99 0.93 0.94  CALCIUM 8.4* 7.8* 7.8*  MG 1.8  --   --     GFR: Estimated Creatinine Clearance: 97 mL/min (by C-G formula based on SCr of 0.94 mg/dL).  Liver Function Tests:  Recent Labs Lab 09/13/16 0542  AST 14*  ALT 11*  ALKPHOS 98  BILITOT 0.5  PROT 6.6  ALBUMIN 2.5*    HbA1C:  Recent Labs  09/12/16 1236  HGBA1C 12.4*    CBG:  Recent Labs Lab 09/13/16 1239 09/13/16 1648 09/13/16 2037 09/14/16 0737 09/14/16 1228  GLUCAP 90 110* 82 218* 97     Recent Results (from the past 240 hour(s))  Culture, blood (routine x 2)     Status: None (Preliminary result)   Collection Time: 09/12/16  2:15 PM  Result Value Ref Range Status   Specimen Description BLOOD RIGHT HAND  Final   Special Requests  BOTTLES DRAWN AEROBIC AND ANAEROBIC 5CC  Final   Culture   Final    NO GROWTH < 24 HOURS Performed at [facility]    Report Status PENDING  Incomplete  Culture, blood (routine x 2)     Status: None (Preliminary result)   Collection Time: 09/12/16  2:30 PM  Result Value Ref Range Status   Specimen Description BLOOD LEFT ARM  Final   Special Requests BOTTLES DRAWN AEROBIC AND ANAEROBIC 5CC  Final   Culture   Final    NO GROWTH < 24 HOURS Performed at [facility]    Report Status PENDING  Incomplete  Wound or Superficial Culture     Status: None (Preliminary result)   Collection Time: 09/12/16  3:30 PM  Result Value Ref Range Status   Specimen Description ABSCESS  Final   Special Requests RIGHT GLUTEAL ABSCESS,ONLY AEROBIC SWAB SENT  Final   Gram Stain   Final    RARE WBC PRESENT,BOTH PMN AND  MONONUCLEAR FEW GRAM POSITIVE COCCI IN PAIRS IN CLUSTERS    Culture ABUNDANT STAPHYLOCOCCUS AUREUS  Final   Report Status PENDING  Incomplete   Organism ID, Bacteria STAPHYLOCOCCUS AUREUS  Final      Susceptibility   Staphylococcus aureus - MIC*    CIPROFLOXACIN <=0.5 SENSITIVE Sensitive     ERYTHROMYCIN >=8 RESISTANT Resistant     GENTAMICIN 8 INTERMEDIATE Intermediate     OXACILLIN <=0.25 SENSITIVE Sensitive     TETRACYCLINE <=1 SENSITIVE Sensitive     VANCOMYCIN 1 SENSITIVE Sensitive     TRIMETH/SULFA 80 RESISTANT Resistant     CLINDAMYCIN <=0.25 SENSITIVE Sensitive     RIFAMPIN <=0.5 SENSITIVE Sensitive     Inducible Clindamycin Value in next row Sensitive      NEGATIVEPerformed at [place]      Radiology Studies: No results found.   Medications:  Scheduled: . clindamycin  300 mg Oral Q8H  . docusate sodium  100 mg Oral BID  . ferrous sulfate  325 mg Oral BID WC  . gabapentin  400 mg Oral TID  . heparin  5,000 Units Subcutaneous Q8H  . insulin aspart  0-15 Units Subcutaneous TID WC  . insulin aspart  0-5 Units Subcutaneous QHS  . insulin aspart  10 Units Subcutaneous TID WC  . insulin glargine  45 Units Subcutaneous Q2200  . lisinopril  20 mg Oral Daily  . saccharomyces boulardii  250 mg Oral BID   Continuous: . 0.9 % NaCl with KCl 20 mEq / L 50 mL/hr at 09/14/16 7353   GDJ:[id number] **OR** acetaminophen, albuterol, HYDROcodone-acetaminophen, lactulose, ondansetron **OR** ondansetron (ZOFRAN) IV, traMADol, zolpidem  Assessment/Plan:  Principal Problem:   Abscess, gluteal, right Active Problems:   Essential hypertension   Uncontrolled type 2 diabetes mellitus with hyperglycemia, with long-term current use of insulin (HCC)   Anemia, iron deficiency   Uncontrolled diabetes mellitus ([place])    Right gluteal abscess. Patient is status post I&D on 10/2. General surgery is following. Cultures sent and grew MSSA.  Patient will be changed over to oral antibiotics today in the form of clindamycin. Add florastor. Outpatient follow-up with general surgery. Mobilize. Anticipate discharge tomorrow. Pain control. Will need home health for dressing changes.  Uncontrolled diabetes mellitus type 2. HbA1c is 12.4. Patient is noncompliant with his home medication regimen. Continue SSI and Lantus.  Essential hypertension. Monitor blood pressures closely. Seems to be reasonably well controlled.  History of  iron deficiency anemia. Continue iron supplementation. Hemoglobin is stable.  DVT Prophylaxis: Subcutaneous heparin    Code Status: Full code  Family Communication: Discussed with the patient  Disposition Plan: Mobilize. Change to oral antibiotics today. Anticipate discharge tomorrow.    LOS: 2 days   [facility] Pager [phone] 09/14/2016, 1:12 PM  If 7PM-7AM, please contact night-coverage at [email or web address], password [facility]

## 2016-09-14 NOTE — Progress Notes (Signed)
Pt demonstrated competency and successfully administered 10 units ordered insulin to the abdomen.

## 2016-09-14 NOTE — Progress Notes (Signed)
[facility] Progress Note     Subjective: Pain controlled, mild soreness over right buttock. Reports night sweats. Tolerating PO. +BMs. Low grade temp (100.8)  Objective: Vital signs in last 24 hours: Temp:  [99 F (37.2 C)-100.8 F (38.2 C)] 99 F (37.2 C) (10/04 0742) Pulse Rate:  [87-101] 87 (10/04 0409) Resp:  [18-20] 20 (10/04 0409) BP: (116-138)/(61-89) 138/89 (10/04 0409) SpO2:  [97 %-100 %] 97 % (10/04 0409) Last BM Date: 09/13/16  Intake/Output from previous day: 10/03 0701 - 10/04 0700 In: 3245 [P.O.:960; I.V.:1535; IV Piggyback:750] Out: 1300 [Urine:1300] Intake/Output this shift: No intake/output data recorded.  PE: Gen:  Alert, NAD, pleasant and cooperative Right gluteal abscess:   Lab Results:   Recent Labs  09/13/16 0542 09/14/16 0601  WBC 13.7* 9.5  HGB 9.3* 9.2*  HCT 29.4* 29.6*  PLT 296 322   BMET  Recent Labs  09/13/16 0542 09/14/16 0601  NA 133* 132*  K 4.2 3.9  CL 105 106  CO2 23 22  GLUCOSE 198* 216*  BUN 13 13  CREATININE 0.93 0.94  CALCIUM 7.8* 7.8*   PT/INR No results for input(s): LABPROT, INR in the last 72 hours. CMP     Component Value Date/Time   NA 132 (L) 09/14/2016 0601   K 3.9 09/14/2016 0601   CL 106 09/14/2016 0601   CO2 22 09/14/2016 0601   GLUCOSE 216 (H) 09/14/2016 0601   BUN 13 09/14/2016 0601   CREATININE 0.94 09/14/2016 0601   CREATININE 0.89 06/24/2016 1615   CALCIUM 7.8 (L) 09/14/2016 0601   PROT 6.6 09/13/2016 0542   ALBUMIN 2.5 (L) 09/13/2016 0542   AST 14 (L) 09/13/2016 0542   ALT 11 (L) 09/13/2016 0542   ALKPHOS 98 09/13/2016 0542   BILITOT 0.5 09/13/2016 0542   GFRNONAA >60 09/14/2016 0601   GFRNONAA >89 06/24/2016 1615   GFRAA >60 09/14/2016 0601   GFRAA >89 06/24/2016 1615   Anti-infectives: Anti-infectives    Start     Dose/Rate Route Frequency Ordered Stop   09/12/16 2200  vancomycin (VANCOCIN) IVPB 1000 mg/200 mL premix  Status:  Discontinued     1,000 mg 200  mL/hr over 60 Minutes Intravenous Every 8 hours 09/12/16 1723 09/12/16 1732   09/12/16 2200  vancomycin (VANCOCIN) IVPB 750 mg/150 ml premix     750 mg 150 mL/hr over 60 Minutes Intravenous Every 8 hours 09/12/16 1732     09/12/16 1400  vancomycin (VANCOCIN) IVPB 1000 mg/200 mL premix     1,000 mg 200 mL/hr over 60 Minutes Intravenous  Once 09/12/16 1358 09/12/16 1523     Assessment/Plan Right gluteal abscess S/p I&D in ED 09/12/16 - fever this morning, pain controlled - WBC normalized - cultures pending: currently growing staph aureus - adjust abx based on sensitivities (clinda <0.25) - continue BID wet-to-dry dressing changes with sterile saline and gauze.   Plan: Continue abx, local wound care as above. General surgery will sign off. Call with questions or concerns.  At discharge: Outpatient follow-up with PCP for re-evaluation of wound in 1-2 weeks. If PCP is unable to evaluate the wound, the patient can follow up in our office with Dr. Marcello Moores in 1-2 weeks. Consult to case management for home health nursing for dressing changes/wound care. Patient may remove dressing, shower with mild soap and water, and replace a clean wet-to-dry dressing afterwards.     LOS: 2 days    Jill Alexanders , [facility] 09/14/2016, 10:26  AM Pager: [phone] Consults: [phone] Mon-Fri 7:00 am-4:30 pm Sat-Sun 7:00 am-11:30 am

## 2016-09-14 NOTE — Progress Notes (Signed)
Inpatient Diabetes Program Recommendations  AACE/ADA: New Consensus Statement on Inpatient Glycemic Control (2015)  Target Ranges:  Prepandial:   less than 140 mg/dL      Peak postprandial:   less than 180 mg/dL (1-2 hours)      Critically ill patients:  140 - 180 mg/dL   Lab Results  Component Value Date   GLUCAP 218 (H) 09/14/2016   HGBA1C 12.4 (H) 09/12/2016    Review of Glycemic Control  Spoke with pt regarding his HgbA1C results of 12.4%. Pt has been educated regarding his elevated HgbA1C and diabetes control in July 2017 by diabetes coordinator. Pt states "I'm just lazy," when asked about why he doesn't check blood sugars at home. Pt states his "friend" gives his insulin sometimes. Explained to pt importance of controlling blood sugars at home. Doubtful patient has given his own insulin. Appears anxious about giving injections.  Instructed RN to allow pt to give all insulin injections and prick finger for monitoring. Blood sugars much improved, although FBS this am was elevated.   Inpatient Diabetes Program Recommendations:    Increase Novolog to 12 units tidwc for meal coverage if eating > 50%. F/U with [facility] within 1-2 weeks of discharge for diabetes management.  Discussed with RN.  Thank you. Lorenda Peck, RD, LDN, CDE Inpatient Diabetes Coordinator [phone]

## 2016-09-14 NOTE — Care Management Note (Addendum)
Case Management Note  Patient Details  Name: [name] MRN: [id number] Date of Birth: [birth year]  Subjective/Objective:                  Right gluteal abscess Action/Plan: Discharge planning Expected Discharge Date:   (unknown)               Expected Discharge Plan:  [place]  In-House Referral:     Discharge planning Services  CM Consult  Post Acute Care Choice:  Home Health Choice offered to:  Patient  DME Arranged:  N/A DME Agency:  NA  HH Arranged:  RN [place] Agency:  [place]  Status of Service:  In process, will continue to follow  If discussed at Long Length of Stay Meetings, dates discussed:    Additional Comments: Cm met with pt to discuss home needs.  Pt states he has "someone" who is a Network engineer at home to accomplish wound care.  Charity referral made to [facility] rep, Manuela Schwartz.  HHRN orders with specific wound care instructions has been requested; as soon as order has been placed, [facility] can begin to work on referral.Pt is active with [facility] and is on the "wait" list for follow up care and the clinic will call pt.  Pt verbalized understanding if he has NOT heard from the clinic by 10/9, he is to call the clinic for the next open scheduling (10/18).   Dellie Catholic, RN 09/14/2016, 2:52 PM

## 2016-09-15 LAB — CBC
HEMATOCRIT: 29.4 % — AB (ref 39.0–52.0)
HEMOGLOBIN: 9.3 g/dL — AB (ref 13.0–17.0)
MCH: 23.5 pg — AB (ref 26.0–34.0)
MCHC: 31.6 g/dL (ref 30.0–36.0)
MCV: 74.2 fL — ABNORMAL LOW (ref 78.0–100.0)
Platelets: 340 10*3/uL (ref 150–400)
RBC: 3.96 MIL/uL — AB (ref 4.22–5.81)
RDW: 17.2 % — ABNORMAL HIGH (ref 11.5–15.5)
WBC: 8.6 10*3/uL (ref 4.0–10.5)

## 2016-09-15 LAB — BASIC METABOLIC PANEL
Anion gap: 4 — ABNORMAL LOW (ref 5–15)
BUN: 10 mg/dL (ref 6–20)
CHLORIDE: 109 mmol/L (ref 101–111)
CO2: 24 mmol/L (ref 22–32)
CREATININE: 0.88 mg/dL (ref 0.61–1.24)
Calcium: 8.4 mg/dL — ABNORMAL LOW (ref 8.9–10.3)
GFR calc non Af Amer: >60 mL/min (ref >60)
Glucose, Bld: 105 mg/dL — ABNORMAL HIGH (ref 65–99)
POTASSIUM: 4.2 mmol/L (ref 3.5–5.1)
SODIUM: 137 mmol/L (ref 135–145)

## 2016-09-15 LAB — AEROBIC CULTURE W GRAM STAIN (SUPERFICIAL SPECIMEN)

## 2016-09-15 LAB — GLUCOSE, CAPILLARY
GLUCOSE-CAPILLARY: 87 mg/dL (ref 65–99)
GLUCOSE-CAPILLARY: 87 mg/dL (ref 65–99)

## 2016-09-15 MED ORDER — INSULIN GLARGINE 100 UNIT/ML SOLOSTAR PEN: 45.0000 [IU] | PEN_INJECTOR | Freq: Every day | SUBCUTANEOUS | 3 refills | Status: DC

## 2016-09-15 MED ORDER — CLINDAMYCIN HCL 300 MG PO CAPS: 300.0000 mg | ORAL_CAPSULE | Freq: Three times a day (TID) | ORAL | 0 refills | Status: DC

## 2016-09-15 MED ORDER — HYDROCODONE-ACETAMINOPHEN 5-325 MG PO TABS: 1.0000 | ORAL_TABLET | ORAL | 0 refills | Status: DC | PRN

## 2016-09-15 MED ORDER — SACCHAROMYCES BOULARDII 250 MG PO CAPS: 250.0000 mg | ORAL_CAPSULE | Freq: Two times a day (BID) | ORAL | 0 refills | Status: DC

## 2016-09-15 MED ORDER — IBUPROFEN 400 MG PO TABS: 400.0000 mg | ORAL_TABLET | Freq: Three times a day (TID) | ORAL | 0 refills | Status: DC | PRN

## 2016-09-15 NOTE — Discharge Summary (Signed)
[facility]  Physician Discharge Summary   Patient ID: Jeffrey [name] MRN: [id number] DOB/AGE: 1966-11-17 50 y.o.  Admit date: 09/12/2016 Discharge date: 09/15/2016  PCP: Arnoldo Morale, MD  DISCHARGE DIAGNOSES:  Principal Problem:   Abscess, gluteal, right Active Problems:   Essential hypertension   Uncontrolled type 2 diabetes mellitus with hyperglycemia, with long-term current use of insulin (HCC)   Anemia, iron deficiency   Uncontrolled diabetes mellitus (HCC)   RECOMMENDATIONS FOR OUTPATIENT FOLLOW UP: 1. Patient to follow-up with Gen. surgery for wound recheck 2. Home health has been ordered for wound dressing   DISCHARGE CONDITION: fair  Diet recommendation: Modified carbohydrate  Filed Weights   09/12/16 1222 09/12/16 1840  Weight: 86.2 kg (190 lb) 85.3 kg (188 lb)    INITIAL HISTORY: 50 year old African-American male with a past medical history of diabetes which is poorly controlled, hypertension, presented with complaints of pain in his right buttock area. He was found to have a gluteal abscess. He was hospitalized for further management.  Consultants: Gen. surgery  Procedures: Incision and drainage of the right gluteal abscess on 10/2   HOSPITAL COURSE:   Right gluteal abscess. Patient was admitted to the hospital. General surgery was consulted. Patient is status post I&D on 10/2. Cultures sent and grew MSSA. Patient was changed over to oral clindamycin from intravenous vancomycin. Added florastor. Outpatient follow-up with general surgery. Patient was mobilized. Wound care instructions provided by general surgery. Home health will be ordered for the same. Pain management.  Uncontrolled diabetes mellitus type 2. HbA1c is 12.4. Patient is noncompliant with his home medication regimen. He was educated and counseled. He was seen by dietitian. He will be given prescription for his Lantus pens.  Essential hypertension. Continue home  medications  History of iron deficiency anemia. Continue iron supplementation. Hemoglobin is stable.  Overall, stable. Okay for discharge home today.    PERTINENT LABS:  The results of significant diagnostics from this hospitalization (including imaging, microbiology, ancillary and laboratory) are listed below for reference.    Microbiology: Recent Results (from the past 240 hour(s))  Culture, blood (routine x 2)     Status: None (Preliminary result)   Collection Time: 09/12/16  2:15 PM  Result Value Ref Range Status   Specimen Description BLOOD RIGHT HAND  Final   Special Requests BOTTLES DRAWN AEROBIC AND ANAEROBIC 5CC  Final   Culture   Final    NO GROWTH 2 DAYS Performed at [facility]    Report Status PENDING  Incomplete  Culture, blood (routine x 2)     Status: None (Preliminary result)   Collection Time: 09/12/16  2:30 PM  Result Value Ref Range Status   Specimen Description BLOOD LEFT ARM  Final   Special Requests BOTTLES DRAWN AEROBIC AND ANAEROBIC 5CC  Final   Culture   Final    NO GROWTH 2 DAYS Performed at [facility]    Report Status PENDING  Incomplete  Wound or Superficial Culture     Status: None   Collection Time: 09/12/16  3:30 PM  Result Value Ref Range Status   Specimen Description ABSCESS  Final   Special Requests RIGHT GLUTEAL ABSCESS,ONLY AEROBIC SWAB SENT  Final   Gram Stain   Final    RARE WBC PRESENT,BOTH PMN AND MONONUCLEAR FEW GRAM POSITIVE COCCI IN PAIRS IN CLUSTERS    Culture ABUNDANT STAPHYLOCOCCUS AUREUS  Final   Report Status 09/15/2016 FINAL  Final   Organism ID, Bacteria STAPHYLOCOCCUS AUREUS  Final      Susceptibility   Staphylococcus aureus - MIC*    CIPROFLOXACIN <=0.5 SENSITIVE Sensitive     ERYTHROMYCIN >=8 RESISTANT Resistant     GENTAMICIN 8 INTERMEDIATE Intermediate     OXACILLIN <=0.25 SENSITIVE Sensitive     TETRACYCLINE <=1 SENSITIVE Sensitive     VANCOMYCIN 1 SENSITIVE Sensitive     TRIMETH/SULFA  80 RESISTANT Resistant     CLINDAMYCIN <=0.25 SENSITIVE Sensitive     RIFAMPIN <=0.5 SENSITIVE Sensitive     Inducible Clindamycin NEGATIVE Sensitive     * ABUNDANT STAPHYLOCOCCUS AUREUS     Labs: Basic Metabolic Panel:  Recent Labs Lab 09/12/16 1236 09/13/16 0542 09/14/16 0601 09/15/16 0521  NA 127* 133* 132* 137  K 3.4* 4.2 3.9 4.2  CL 96* 105 106 109  CO2 21* 23 22 24   GLUCOSE 395* 198* 216* 105*  BUN 14 13 13 10   CREATININE 0.99 0.93 0.94 0.88  CALCIUM 8.4* 7.8* 7.8* 8.4*  MG 1.8  --   --   --    Liver Function Tests:  Recent Labs Lab 09/13/16 0542  AST 14*  ALT 11*  ALKPHOS 98  BILITOT 0.5  PROT 6.6  ALBUMIN 2.5*   CBC:  Recent Labs Lab 09/12/16 1236 09/13/16 0542 09/14/16 0601 09/15/16 0521  WBC 14.8* 13.7* 9.5 8.6  HGB 10.6* 9.3* 9.2* 9.3*  HCT 33.4* 29.4* 29.6* 29.4*  MCV 73.4* 74.1* 74.2* 74.2*  PLT 292 296 322 340    CBG:  Recent Labs Lab 09/14/16 1228 09/14/16 2105 09/14/16 2339 09/15/16 0717 09/15/16 1128  GLUCAP 97 79 109* 87 87     DISCHARGE EXAMINATION: Vitals:   09/14/16 0742 09/14/16 1400 09/14/16 2104 09/15/16 0527  BP:  130/72 110/66 120/68  Pulse:  80 85 81  Resp:  18 18 18   Temp: 99 F (37.2 C) 99 F (37.2 C) 98.9 F (37.2 C) 98.5 F (36.9 C)  TempSrc: Oral Oral Oral Oral  SpO2:  100% 96% 99%  Weight:      Height:       General appearance: alert, cooperative, appears stated age and no distress Resp: clear to auscultation bilaterally Cardio: regular rate and rhythm, S1, S2 normal, no murmur, click, rub or gallop GI: soft, non-tender; bowel sounds normal; no masses,  no organomegaly Extremities: extremities normal, atraumatic, no cyanosis or edema Neurologic: Alert and oriented X 3, normal strength and tone. Normal symmetric reflexes. Normal coordination and gait  DISPOSITION: Home  Discharge Instructions    Call MD for:  extreme fatigue    Complete by:  As directed    Call MD for:  persistant dizziness  or light-headedness    Complete by:  As directed    Call MD for:  persistant nausea and vomiting    Complete by:  As directed    Call MD for:  redness, tenderness, or signs of infection (pain, swelling, redness, odor or green/yellow discharge around incision site)    Complete by:  As directed    Call MD for:  severe uncontrolled pain    Complete by:  As directed    Call MD for:  temperature >100.4    Complete by:  As directed    Discharge instructions    Complete by:  As directed    Please be sure to take all your medications as prescribed. Please follow-up with general surgeon as instructed. Take over-the-counter stool softeners such as Senokot when taking pain medications.  You were cared  for by a hospitalist during your hospital stay. If you have any questions about your discharge medications or the care you received while you were in the hospital after you are discharged, you can call the unit and asked to speak with the hospitalist on call if the hospitalist that took care of you is not available. Once you are discharged, your primary care physician will handle any further medical issues. Please note that NO REFILLS for any discharge medications will be authorized once you are discharged, as it is imperative that you return to your primary care physician (or establish a relationship with a primary care physician if you do not have one) for your aftercare needs so that they can reassess your need for medications and monitor your lab values. If you do not have a primary care physician, you can call [phone] for a physician referral.   Discharge wound care:    Complete by:  As directed    Patient may remove dressing, shower with mild soap and water, and replace a clean wet-to-dry dressing afterwards   Increase activity slowly    Complete by:  As directed       ALLERGIES: No Known Allergies   Current Discharge Medication List    START taking these medications   Details  clindamycin  (CLEOCIN) 300 MG capsule Take 1 capsule (300 mg total) by mouth every 8 (eight) hours. Qty: 30 capsule, Refills: 0    ibuprofen (ADVIL,MOTRIN) 400 MG tablet Take 1 tablet (400 mg total) by mouth every 8 (eight) hours as needed for moderate pain. Qty: 30 tablet, Refills: 0    saccharomyces boulardii (FLORASTOR) 250 MG capsule Take 1 capsule (250 mg total) by mouth 2 (two) times daily. Qty: 30 capsule, Refills: 0      CONTINUE these medications which have CHANGED   Details  HYDROcodone-acetaminophen (NORCO/VICODIN) 5-325 MG tablet Take 1-2 tablets by mouth every 4 (four) hours as needed for moderate pain. Qty: 15 tablet, Refills: 0    Insulin Glargine (LANTUS SOLOSTAR) 100 UNIT/ML Solostar Pen Inject 45 Units into the skin daily at 10 pm. Qty: 5 pen, Refills: 3   Associated Diagnoses: Uncontrolled type 2 diabetes mellitus with hyperglycemia, with long-term current use of insulin ([place])      CONTINUE these medications which have NOT CHANGED   Details  albuterol (PROVENTIL HFA;VENTOLIN HFA) 108 (90 Base) MCG/ACT inhaler Inhale 2 puffs into the lungs every 4 (four) hours as needed for wheezing or shortness of breath. Qty: 1 Inhaler, Refills: 2    butalbital-acetaminophen-caffeine (ESGIC) 50-325-40 MG tablet Take 1 tablet by mouth every 6 (six) hours as needed for headache. Qty: 30 tablet, Refills: 0   Associated Diagnoses: Headache, unspecified headache type    ferrous sulfate 325 (65 FE) MG tablet Take 1 tablet (325 mg total) by mouth 2 (two) times daily with a meal. Qty: 60 tablet, Refills: 3    gabapentin (NEURONTIN) 400 MG capsule Take 1 capsule (400 mg total) by mouth 3 (three) times daily. Qty: 90 capsule, Refills: 3   Associated Diagnoses: Diabetic polyneuropathy associated with type 2 diabetes mellitus (HCC)    insulin aspart (NOVOLOG) 100 UNIT/ML injection Inject 0-15 Units into the skin 3 (three) times daily with meals. Sliding scale  CBG 70 - 120: 0 units: CBG 121 - 140: 2  units; CBG 140 - 200: 4 units; CBG 201 - 240: 6 units; CBG 240 - 300: 8 units;CBG 300 - 350: 12 units; CBG 351 - 400: 16 units;  CBG > 400 : 16 units and notify your  MD Qty: 10 mL, Refills: 3   Associated Diagnoses: Uncontrolled type 2 diabetes mellitus with hyperglycemia, with long-term current use of insulin (HCC)    lactulose (CHRONULAC) 10 GM/15ML solution Take 15 mLs (10 g total) by mouth 2 (two) times daily as needed for mild constipation. Qty: 946 mL, Refills: 1   Associated Diagnoses: Other constipation    lisinopril (PRINIVIL,ZESTRIL) 20 MG tablet Take 1 tablet (20 mg total) by mouth daily. Qty: 30 tablet, Refills: 3   Associated Diagnoses: Essential hypertension      STOP taking these medications     promethazine (PHENERGAN) 25 MG tablet      sulfamethoxazole-trimethoprim (BACTRIM DS,SEPTRA DS) 800-160 MG tablet         Follow-up Information    [facility]. Call on 09/19/2016.   Specialty:  Internal Medicine Why:  Call the clinic on 10/9 to schedule appt (You have been placed on the waiting list and will be called if there is a cancellation before the 9th of October.) Contact information: [address] [id number] [place] [address] [phone]       Rosario Adie., MD Follow up in 1 week(s).   Specialty:  General Surgery Why:  for wound check Contact information: [place] [address] [place] [address] [phone]           TOTAL DISCHARGE TIME: 35 minutes  [place] [facility] Pager [phone]  09/15/2016, 1:32 PM

## 2016-09-15 NOTE — Plan of Care (Signed)
Problem: Food- and Nutrition-Related Knowledge Deficit (NB-1.1) Goal: Nutrition education Formal process to instruct or train a patient/client in a skill or to impart knowledge to help patients/clients voluntarily manage or modify food choices and eating behavior to maintain or improve health.  Outcome: Completed/Met Date Met: 09/15/16  RD provided nutrition education regarding diabetes after identifying need at initial assessment.   Lab Results  Component Value Date   HGBA1C 12.4 (H) 09/12/2016    Patient reports he has been noncompliant with his diabetes management lately. He has seen a RD in the past regarding nutrition for DM management, but has not implemented carbohydrate counting. He reports he is now ready to manage his diabetes appropriately and that he has a friend that can help him. He reports frequently skipping meals and eating simple carbohydrates such as sweets and soda, also eats unbalanced meals (lacking in protein and fiber). Through practice with ordering patient's breakfast and teach-back, he seems to have good understanding of concepts. Patient reports he goes to a community health center where there is a Microbiologist. Recommended he ask to see the RD for ongoing counseling as needed.  RD provided "Carbohydrate Counting for People with Diabetes" handout from the Academy of Nutrition and Dietetics. Discussed different food groups and their effects on blood sugar, emphasizing carbohydrate-containing foods. Provided list of carbohydrates and recommended serving sizes of common foods.  Discussed importance of controlled and consistent carbohydrate intake throughout the day. Provided examples of ways to balance meals/snacks and encouraged intake of high-fiber, whole grain complex carbohydrates. Teach back method used.  Expect fair compliance.  Body mass index is 29.44 kg/m. Pt meets criteria for Overweight based on current BMI.  Current diet order is CHO Modified/Heart Healthy,  patient is consuming approximately 100% of meals at this time. Labs and medications reviewed. No further nutrition interventions warranted at this time. RD contact information provided. If additional nutrition issues arise, please re-consult RD.  Willey Blade, MS, RD, LDN Pager: [phone] After Hours Pager: [phone]

## 2016-09-15 NOTE — Progress Notes (Signed)
Discharge instructions given to pt, verbalized understanding. Left the unit in stable condition. 

## 2016-09-15 NOTE — Discharge Instructions (Signed)
Abscess An abscess (boil or furuncle) is an infected area on or under the skin. This area is filled with yellowish-white fluid (pus) and other material (debris). HOME CARE   Only take medicines as told by your doctor.  If you were given antibiotic medicine, take it as directed. Finish the medicine even if you start to feel better.  If gauze is used, follow your doctor's directions for changing the gauze.  To avoid spreading the infection:  Keep your abscess covered with a bandage.  Wash your hands well.  Do not share personal care items, towels, or whirlpools with others.  Avoid skin contact with others.  Keep your skin and clothes clean around the abscess.  Keep all doctor visits as told. GET HELP RIGHT AWAY IF:   You have more pain, puffiness (swelling), or redness in the wound site.  You have more fluid or blood coming from the wound site.  You have muscle aches, chills, or you feel sick.  You have a fever. MAKE SURE YOU:   Understand these instructions.  Will watch your condition.  Will get help right away if you are not doing well or get worse.   This information is not intended to replace advice given to you by your health care provider. Make sure you discuss any questions you have with your health care provider.   Document Released: 05/16/2008 Document Revised: 05/29/2012 Document Reviewed: 02/11/2012 Elsevier Interactive Patient Education 2016 [place] A dressing is a material placed over wounds. It keeps the wound clean, dry, and protected from further injury.  BEFORE YOU BEGIN  Get your supplies together. Things you may need include:  Salt solution (saline).  Flexible gauze bandage.  Medicated cream.  Tape.  Gloves.  Belly (abdominal) pads.  Gauze squares.  Plastic bags.  Take pain medicine 30 minutes before the bandage change if you need it.  Take a shower before you do the first bandage change of the day. Put  plastic wrap or a bag over the dressing. REMOVING YOUR OLD BANDAGE  Wash your hands with soap and water. Dry your hands with a clean towel.  Put on your gloves.  Remove any tape.  Remove the old bandage as told. If it sticks, put a small amount of warm water on it to loosen the bandage.  Remove any gauze or packing tape in your wound.  Take off your gloves.  Put the gloves, tape, gauze, or any packing tape in a plastic bag. CHANGING YOUR BANDAGE  Open the supplies.  Take the cap off the salt solution.  Open the gauze. Leave the gauze on the inside of the package.  Put on your gloves.  Clean your wound as told by your doctor.  Keep your wound dry if your doctor told you to do so.  Your doctor may tell you to do one or more of the following:  Pick up the gauze. Pour the salt solution over the gauze. Squeeze out the extra salt solution.  Put medicated cream or other medicine on your wound.  Put solution soaked gauze only in your wound, not on the skin around it.  Pack your wound loosely.  Put dry gauze on your wound.  Put belly pads over the dry gauze if your bandages soak through.  Tape the bandage in place so it will not fall off. Do not wrap the tape all the way around your arm or leg.  Wrap the bandage with the flexible gauze bandage  as told by your doctor.  Take off your gloves. Put them in the plastic bag with the old bandage. Tie the bag shut and throw it away.  Keep the bandage clean and dry.  Wash your hands. GET HELP RIGHT AWAY IF:   Your skin around the wound looks red.  Your wound feels more tender or sore.  You see yellowish-white fluid (pus) in the wound.  Your wound smells bad.  You have a fever.  Your skin around the wound has a red rash that itches and burns.  You see black or yellow skin in your wound that was not there before.  You feel sick to your stomach (nauseous), throw up (vomit), and feel very tired.   This information is  not intended to replace advice given to you by your health care provider. Make sure you discuss any questions you have with your health care provider.   Document Released: 02/24/2009 Document Revised: 12/19/2014 Document Reviewed: 10/09/2011 Elsevier Interactive Patient Education [employer].

## 2016-09-17 LAB — CULTURE, BLOOD (ROUTINE X 2)
Culture: NO GROWTH
Culture: NO GROWTH

## 2016-09-19 MED FILL — CLINDAMYCIN HCL 300 MG CAP: 300 | 10 days supply | Qty: 30 | Fill #0

## 2016-09-20 ENCOUNTER — Telehealth: Payer: Self-pay | Admitting: Family Medicine

## 2016-09-20 NOTE — Telephone Encounter (Signed)
Abnormal reading. High bp 156/110 sitting Standing 160/100 Patient had not taken morning dose of lisinopril. Patient was heading to sisters house upon leaving. Please follow up  Patient was scheduled a nurse visit tomorrow to check bp.

## 2016-09-22 NOTE — Telephone Encounter (Signed)
BP is high because he did not take his Lisinopril. He needs to be compliant with his medications.

## 2016-09-23 NOTE — Telephone Encounter (Signed)
Dr. Jarold Song aware and will see patient at his scheduled appt on 09/28/16.

## 2016-09-28 ENCOUNTER — Encounter: Payer: Self-pay | Admitting: Family Medicine

## 2016-09-28 ENCOUNTER — Ambulatory Visit: Payer: Self-pay | Attending: Family Medicine | Admitting: Family Medicine

## 2016-09-28 VITALS — BP 136/87 | HR 87 | Temp 98.5°F | Resp 18 | Ht 67.0 in | Wt 191.4 lb

## 2016-09-28 DIAGNOSIS — Z794 Long term (current) use of insulin: Secondary | ICD-10-CM | POA: Insufficient documentation

## 2016-09-28 DIAGNOSIS — I10 Essential (primary) hypertension: Secondary | ICD-10-CM | POA: Insufficient documentation

## 2016-09-28 DIAGNOSIS — Z9114 Patient's other noncompliance with medication regimen: Secondary | ICD-10-CM | POA: Insufficient documentation

## 2016-09-28 DIAGNOSIS — Z9119 Patient's noncompliance with other medical treatment and regimen: Secondary | ICD-10-CM | POA: Insufficient documentation

## 2016-09-28 DIAGNOSIS — E1142 Type 2 diabetes mellitus with diabetic polyneuropathy: Secondary | ICD-10-CM

## 2016-09-28 DIAGNOSIS — L0231 Cutaneous abscess of buttock: Secondary | ICD-10-CM | POA: Insufficient documentation

## 2016-09-28 DIAGNOSIS — Z79899 Other long term (current) drug therapy: Secondary | ICD-10-CM | POA: Insufficient documentation

## 2016-09-28 DIAGNOSIS — E1165 Type 2 diabetes mellitus with hyperglycemia: Secondary | ICD-10-CM | POA: Insufficient documentation

## 2016-09-28 LAB — GLUCOSE, POCT (MANUAL RESULT ENTRY): POC GLUCOSE: 222 mg/dL — AB (ref 70–99)

## 2016-09-28 MED ORDER — INSULIN GLARGINE 100 UNIT/ML SOLOSTAR PEN: 55.0000 [IU] | PEN_INJECTOR | Freq: Every day | SUBCUTANEOUS | 3 refills | Status: DC

## 2016-09-28 MED ORDER — GABAPENTIN 400 MG PO CAPS: 400.0000 mg | ORAL_CAPSULE | Freq: Three times a day (TID) | ORAL | 3 refills | Status: DC

## 2016-09-28 MED ORDER — LISINOPRIL 20 MG PO TABS: 20.0000 mg | ORAL_TABLET | Freq: Every day | ORAL | 3 refills | Status: DC

## 2016-09-28 MED ORDER — INSULIN ASPART 100 UNIT/ML ~~LOC~~ SOLN: 0.0000 [IU] | Freq: Three times a day (TID) | SUBCUTANEOUS | 3 refills | Status: DC

## 2016-09-28 NOTE — Progress Notes (Signed)
Patient is here for HFU DM  Patient denies pain at this time.  Patient has not taken medication today. Patient has eaten today.  Patient declined flu vaccine today.

## 2016-09-28 NOTE — Progress Notes (Signed)
Subjective:  Patient ID: Jeffrey [name], male    DOB: 06/11/1966  Age: 50 y.o. 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  He was hospitalized between 09/12/16 and 09/15/16 due to right gluteal abscess. He was seen by general surgery and status post incision and drainage, cultures positive for MSSA. Treated with IV vancomycin and later switched to clindamycin and Florastor which he was discharged on. He was also discharged with home health nursing for wound care.  He is accompanied by his girlfriend today and is yet to pick up the clindamycin prescription he was discharged on 2 weeks ago. Services from the home care been discontinued and dressing changes performed by his girlfriend who has noticed some discharge from the abscess. The patient denies fever.  He does not have his blood sugar log with him but his girlfriend states that he has maintained dietary indiscretion and has been drinking lots of sodas.  Past Medical History:  Diagnosis Date  . Diabetes mellitus   . Diabetic foot infection ([place]) 03/2016   RT FOOT  . HTN (hypertension)     Past Surgical History:  Procedure Laterality Date  . AMPUTATION Right 04/01/2016   Procedure: Right Great Toe Amputation;  Surgeon: Newt Minion, MD;  Location: [facility];  Service: Orthopedics;  Laterality: Right;  . AMPUTATION Right 06/19/2016   Procedure: AMPUTATION SECOND TOE;  Surgeon: Marybelle Killings, MD;  Location: [facility];  Service: Orthopedics;  Laterality: Right;  . BACK SURGERY     for abscess    No Known Allergies Outpatient Medications Prior to Visit  Medication Sig Dispense Refill  . albuterol (PROVENTIL HFA;VENTOLIN HFA) 108 (90 Base) MCG/ACT inhaler Inhale 2 puffs into  the lungs every 4 (four) hours as needed for wheezing or shortness of breath. 1 Inhaler 2  . butalbital-acetaminophen-caffeine (ESGIC) 50-325-40 MG tablet Take 1 tablet by mouth every 6 (six) hours as needed for headache. 30 tablet 0  . clindamycin (CLEOCIN) 300 MG capsule Take 1 capsule (300 mg total) by mouth every 8 (eight) hours. (Patient taking differently: Take 300 mg by mouth every 8 (eight) hours. ) 30 capsule 0  . ferrous sulfate 325 (65 FE) MG tablet Take 1 tablet (325 mg total) by mouth 2 (two) times daily with a meal. 60 tablet 3  . ibuprofen (ADVIL,MOTRIN) 400 MG tablet Take 1 tablet (400 mg total) by mouth every 8 (eight) hours as needed for moderate pain. 30 tablet 0  . lactulose (CHRONULAC) 10 GM/15ML solution Take 15 mLs (10 g total) by mouth 2 (two) times daily as needed for mild constipation. 946 mL 1  . saccharomyces boulardii (FLORASTOR) 250 MG capsule Take 1 capsule (250 mg total) by mouth 2 (two) times daily. 30 capsule 0  . gabapentin (NEURONTIN) 400 MG capsule Take 1 capsule (400 mg total) by mouth 3 (three) times daily. 90 capsule 3  . insulin aspart (NOVOLOG) 100 UNIT/ML injection Inject 0-15 Units into the skin 3 (three) times daily with meals. Sliding scale  CBG 70 - 120: 0 units: CBG 121 - 140: 2 units; CBG 140 - 200: 4 units; CBG 201 - 240: 6 units; CBG 240 - 300: 8 units;CBG 300 - 350: 12 units; CBG 351 - 400: 16 units;  CBG > 400 : 16 units and notify your  MD 10 mL 3  . Insulin Glargine (LANTUS SOLOSTAR) 100 UNIT/ML Solostar Pen Inject 45 Units into the skin daily at 10 pm. 5 pen 3  . lisinopril (PRINIVIL,ZESTRIL) 20 MG tablet Take 1 tablet (20 mg total) by mouth daily. 30 tablet 3  . HYDROcodone-acetaminophen (NORCO/VICODIN) 5-325 MG tablet Take 1-2 tablets by mouth every 4 (four) hours as needed for moderate pain. (Patient not taking: Reported on 09/28/2016) 15 tablet 0   No facility-administered medications prior to visit.     ROS Review of Systems    Constitutional: Negative for activity change and appetite change.  HENT: Negative for sinus pressure and sore throat.   Eyes: Negative for visual disturbance.  Respiratory: Negative for cough, chest tightness and shortness of breath.   Cardiovascular: Negative for chest pain and leg swelling.  Gastrointestinal: Negative for abdominal distention, abdominal pain, constipation and diarrhea.  Endocrine: Negative.   Genitourinary: Negative for dysuria.  Musculoskeletal: Negative for joint swelling and myalgias.  Skin:       See hpi  Allergic/Immunologic: Negative.   Neurological: Negative for weakness, light-headedness and numbness.  Psychiatric/Behavioral: Negative for dysphoric mood and suicidal ideas.    Objective:  BP 136/87 (BP Location: Right Arm, Patient Position: Sitting, Cuff Size: Large)   Pulse 87   Temp 98.5 F (36.9 C) (Oral)   Resp 18   Ht 5\' 7"  (1.702 m)   Wt 191 lb 6.4 oz (86.8 kg)   SpO2 99%   BMI 29.98 kg/m   BP/Weight 09/28/2016 09/15/2016 64/02/3294  Systolic BP 188 416 -  Diastolic BP 87 89 -  Wt. (Lbs) 191.4 - 188  BMI 29.98 - 29.44      Physical Exam  Constitutional: He is oriented to person, place, and time. He appears well-developed and well-nourished.  Cardiovascular: Normal rate, normal heart sounds and intact distal pulses.   No murmur heard. Pulmonary/Chest: Effort normal and breath sounds normal. He has no wheezes. He has no rales. He exhibits no tenderness.  Abdominal: Soft. Bowel sounds are normal. He exhibits no distension and no mass. There is no tenderness.  Musculoskeletal: Normal range of motion.  Neurological: He is alert and oriented to person, place, and time.  Skin:  Right gluteal abscess cavity with surrounding necrosis and associated purulent discharge. Not tender to palpation.     Assessment & Plan:   1. Abscess, gluteal, right He has been strongly advised to pick up His clindamycin prescription  2. Noncompliance with  medication regimen We have discussed compliance over and over again at several of his visits but he seems to be nonchalant  3. Essential hypertension Controlled - lisinopril (PRINIVIL,ZESTRIL) 20 MG tablet; Take 1 tablet (20 mg total) by mouth daily.  Dispense: 30 tablet; Refill: 3  4. Diabetic polyneuropathy associated with type 2 diabetes mellitus ([place]) Uncontrolled due to noncompliance and poor glycemic control - gabapentin (NEURONTIN) 400 MG capsule; Take 1 capsule (400 mg total) by mouth 3 (three) times daily.  Dispense: 90 capsule; Refill: 3  5. Uncontrolled type 2 diabetes mellitus with hyperglycemia, with long-term current use of insulin (HCC) Uncontrolled with A1c of 12.4 Uncontrolled due to dietary indiscretion and noncompliance Increase Lantus from 45 units to 55 units daily - Glucose (CBG) - insulin aspart (NOVOLOG) 100 UNIT/ML injection; Inject 0-15 Units into the skin 3 (three) times daily with meals. Sliding scale  CBG 70 - 120: 0 units: CBG 121 - 140: 2 units;  CBG 140 - 200: 4 units; CBG 201 - 240: 6 units; CBG 240 - 300: 8 units;CBG 300 - 350: 12 units; CBG 351 - 400: 16 units; CBG > 400 : 16 units and notify your  MD  Dispense: 10 mL; Refill: 3 - Insulin Glargine (LANTUS SOLOSTAR) 100 UNIT/ML Solostar Pen; Inject 55 Units into the skin daily at 10 pm.  Dispense: 5 pen; Refill: 3 - Lipid panel; Future   Meds ordered this encounter  Medications  . gabapentin (NEURONTIN) 400 MG capsule    Sig: Take 1 capsule (400 mg total) by mouth 3 (three) times daily.    Dispense:  90 capsule    Refill:  3  . insulin aspart (NOVOLOG) 100 UNIT/ML injection    Sig: Inject 0-15 Units into the skin 3 (three) times daily with meals. Sliding scale  CBG 70 - 120: 0 units: CBG 121 - 140: 2 units; CBG 140 - 200: 4 units; CBG 201 - 240: 6 units; CBG 240 - 300: 8 units;CBG 300 - 350: 12 units; CBG 351 - 400: 16 units; CBG > 400 : 16 units and notify your  MD    Dispense:  10 mL    Refill:  3  .  Insulin Glargine (LANTUS SOLOSTAR) 100 UNIT/ML Solostar Pen    Sig: Inject 55 Units into the skin daily at 10 pm.    Dispense:  5 pen    Refill:  3    Discontinue previous dose  . lisinopril (PRINIVIL,ZESTRIL) 20 MG tablet    Sig: Take 1 tablet (20 mg total) by mouth daily.    Dispense:  30 tablet    Refill:  3    Discontinue previous dose    Follow-up: One month follow-up of diabetes mellitus   Arnoldo Morale MD

## 2016-10-03 ENCOUNTER — Encounter (HOSPITAL_COMMUNITY): Payer: Self-pay | Admitting: Emergency Medicine

## 2016-10-03 ENCOUNTER — Emergency Department (HOSPITAL_COMMUNITY)
Admission: EM | Admit: 2016-10-03 | Discharge: 2016-10-03 | Disposition: A | Discharge status: Home / Self Care | Payer: Self-pay | Attending: Emergency Medicine | Admitting: Emergency Medicine

## 2016-10-03 DIAGNOSIS — E11622 Type 2 diabetes mellitus with other skin ulcer: Secondary | ICD-10-CM | POA: Insufficient documentation

## 2016-10-03 DIAGNOSIS — Z794 Long term (current) use of insulin: Secondary | ICD-10-CM | POA: Insufficient documentation

## 2016-10-03 DIAGNOSIS — I10 Essential (primary) hypertension: Secondary | ICD-10-CM | POA: Insufficient documentation

## 2016-10-03 DIAGNOSIS — Z79899 Other long term (current) drug therapy: Secondary | ICD-10-CM | POA: Insufficient documentation

## 2016-10-03 DIAGNOSIS — L98419 Non-pressure chronic ulcer of buttock with unspecified severity: Secondary | ICD-10-CM | POA: Insufficient documentation

## 2016-10-03 DIAGNOSIS — E1165 Type 2 diabetes mellitus with hyperglycemia: Secondary | ICD-10-CM

## 2016-10-03 DIAGNOSIS — E1140 Type 2 diabetes mellitus with diabetic neuropathy, unspecified: Secondary | ICD-10-CM | POA: Insufficient documentation

## 2016-10-03 LAB — CBC WITH DIFFERENTIAL/PLATELET
Basophils Absolute: 0 10*3/uL (ref 0.0–0.1)
Basophils Relative: 1 %
EOS PCT: 1 %
Eosinophils Absolute: 0 10*3/uL (ref 0.0–0.7)
HEMATOCRIT: 35.8 % — AB (ref 39.0–52.0)
Hemoglobin: 11.1 g/dL — ABNORMAL LOW (ref 13.0–17.0)
LYMPHS ABS: 1.5 10*3/uL (ref 0.7–4.0)
LYMPHS PCT: 39 %
MCH: 23.3 pg — AB (ref 26.0–34.0)
MCHC: 31 g/dL (ref 30.0–36.0)
MCV: 75.1 fL — AB (ref 78.0–100.0)
MONO ABS: 0.2 10*3/uL (ref 0.1–1.0)
Monocytes Relative: 6 %
NEUTROS ABS: 2.1 10*3/uL (ref 1.7–7.7)
Neutrophils Relative %: 53 %
PLATELETS: 279 10*3/uL (ref 150–400)
RBC: 4.77 MIL/uL (ref 4.22–5.81)
RDW: 17.2 % — ABNORMAL HIGH (ref 11.5–15.5)
WBC: 3.8 10*3/uL — AB (ref 4.0–10.5)

## 2016-10-03 LAB — BASIC METABOLIC PANEL
Anion gap: 6 (ref 5–15)
BUN: 13 mg/dL (ref 6–20)
CO2: 25 mmol/L (ref 22–32)
Calcium: 8.9 mg/dL (ref 8.9–10.3)
Chloride: 106 mmol/L (ref 101–111)
Creatinine, Ser: 0.83 mg/dL (ref 0.61–1.24)
GFR calc Af Amer: >60 mL/min (ref >60)
GLUCOSE: 200 mg/dL — AB (ref 65–99)
POTASSIUM: 3.6 mmol/L (ref 3.5–5.1)
Sodium: 137 mmol/L (ref 135–145)

## 2016-10-03 LAB — CBG MONITORING, ED: Glucose-Capillary: 175 mg/dL — ABNORMAL HIGH (ref 65–99)

## 2016-10-03 NOTE — ED Provider Notes (Signed)
[facility] DEPT Provider Note   CSN: [id number] Arrival date & time: 10/03/16  1548     History   Chief Complaint Chief Complaint  Patient presents with  . Skin Ulcer    diabetic ulcer    HPI Jeffrey [name] is a 50 y.o. male.  Pt presents to the ED today with an ulcer on his right buttock.  The pt was admitted to the hospital from 10/2-10/5 for an a diabetic right gluteal abscess.  The pt had it debrided on 10/5.  He has followed up with general surgery on the 19th and things were going well.  The pt has a home health nurse who told the patient to come in because there was from discharge.  Pt said that he did finally pick up his clindamycin abx which he had not picked up by the 18th when he saw his pcp.  The pt denies fever or chills.      Past Medical History:  Diagnosis Date  . Diabetes mellitus   . Diabetic foot infection ([place]) 03/2016   RT FOOT  . HTN (hypertension)     Patient Active Problem List   Diagnosis Date Noted  . Uncontrolled diabetes mellitus ([place]) 09/12/2016  . Abscess, gluteal, right 09/12/2016  . Folliculitis 12/75/1700  . Hyponatremia 06/18/2016  . Hypokalemia 06/18/2016  . 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 ([place]) 17/49/4496  . Anemia, iron deficiency   . Noncompliance with medication regimen 03/30/2016  . Uncontrolled type 2 diabetes mellitus with hyperglycemia, with long-term current use of insulin ([place])   . Cellulitis of right foot 02/19/2016  . Diabetic foot infection ([place]) 02/18/2016  . Cellulitis 09/25/2015  . Cellulitis of back   . Essential hypertension 08/21/2013  . Diabetic neuropathy ([place]) 08/21/2013  . Blurry vision 08/21/2013  . Fungal toenail infection 08/21/2013    Past Surgical History:  Procedure Laterality Date  . AMPUTATION Right 04/01/2016   Procedure: Right Great Toe Amputation;  Surgeon: Newt Minion, MD;  Location: [facility];  Service: Orthopedics;  Laterality: Right;  . AMPUTATION Right  06/19/2016   Procedure: AMPUTATION SECOND TOE;  Surgeon: Marybelle Killings, MD;  Location: [facility];  Service: Orthopedics;  Laterality: Right;  . BACK SURGERY     for abscess       Home Medications    Prior to Admission medications   Medication Sig Start Date End Date Taking? Authorizing Provider  albuterol (PROVENTIL HFA;VENTOLIN HFA) 108 (90 Base) MCG/ACT inhaler Inhale 2 puffs into the lungs every 4 (four) hours as needed for wheezing or shortness of breath. 05/09/16  Yes Orpah Greek, MD  butalbital-acetaminophen-caffeine (ESGIC) 50-325-40 MG tablet Take 1 tablet by mouth every 6 (six) hours as needed for headache. 04/13/16  Yes Arnoldo Morale, MD  clindamycin (CLEOCIN) 300 MG capsule Take 1 capsule (300 mg total) by mouth every 8 (eight) hours. Patient taking differently: Take 300 mg by mouth 2 (two) times daily.  09/15/16  Yes Bonnielee Haff, MD  ferrous sulfate 325 (65 FE) MG tablet Take 1 tablet (325 mg total) by mouth 2 (two) times daily with a meal. 04/03/16  Yes Nishant Dhungel, MD  gabapentin (NEURONTIN) 400 MG capsule Take 1 capsule (400 mg total) by mouth 3 (three) times daily. 09/28/16  Yes Arnoldo Morale, MD  ibuprofen (ADVIL,MOTRIN) 400 MG tablet Take 1 tablet (400 mg total) by mouth every 8 (eight) hours as needed for moderate pain. 09/15/16  Yes Bonnielee Haff, MD  insulin aspart (  NOVOLOG) 100 UNIT/ML injection Inject 0-15 Units into the skin 3 (three) times daily with meals. Sliding scale  CBG 70 - 120: 0 units: CBG 121 - 140: 2 units; CBG 140 - 200: 4 units; CBG 201 - 240: 6 units; CBG 240 - 300: 8 units;CBG 300 - 350: 12 units; CBG 351 - 400: 16 units; CBG > 400 : 16 units and notify your  MD 09/28/16  Yes Arnoldo Morale, MD  Insulin Glargine (LANTUS SOLOSTAR) 100 UNIT/ML Solostar Pen Inject 55 Units into the skin daily at 10 pm. 09/28/16  Yes Arnoldo Morale, MD  lactulose (CHRONULAC) 10 GM/15ML solution Take 15 mLs (10 g total) by mouth 2 (two) times daily as needed for mild  constipation. 02/24/16  Yes Arnoldo Morale, MD  lisinopril (PRINIVIL,ZESTRIL) 20 MG tablet Take 1 tablet (20 mg total) by mouth daily. 09/28/16  Yes Arnoldo Morale, MD  HYDROcodone-acetaminophen (NORCO/VICODIN) 5-325 MG tablet Take 1-2 tablets by mouth every 4 (four) hours as needed for moderate pain. Patient not taking: Reported on 09/28/2016 09/15/16   Bonnielee Haff, MD  saccharomyces boulardii (FLORASTOR) 250 MG capsule Take 1 capsule (250 mg total) by mouth 2 (two) times daily. 09/15/16   Bonnielee Haff, MD    Family History Family History  Problem Relation Age of Onset  . Diabetes Mother   . Hypertension Mother   . Diabetes Father   . Heart attack Father   . Diabetes Sister     Social History Social History  Substance Use Topics  . Smoking status: Never Smoker  . Smokeless tobacco: Never Used  . Alcohol use No     Allergies   Review of patient's allergies indicates no known allergies.   Review of Systems Review of Systems  Skin: Positive for wound.  All other systems reviewed and are negative.    Physical Exam Updated Vital Signs BP 141/88 (BP Location: Left Arm)   Pulse 89   Temp 98.6 F (37 C) (Oral)   Resp 18   Wt 187 lb (84.8 kg)   SpO2 100%   BMI 29.29 kg/m   Physical Exam  Constitutional: He is oriented to person, place, and time. He appears well-developed and well-nourished.  HENT:  Head: Normocephalic and atraumatic.  Right Ear: External ear normal.  Left Ear: External ear normal.  Nose: Nose normal.  Mouth/Throat: Oropharynx is clear and moist.  Eyes: Conjunctivae and EOM are normal. Pupils are equal, round, and reactive to light.  Neck: Normal range of motion. Neck supple.  Cardiovascular: Normal rate, regular rhythm, normal heart sounds and intact distal pulses.   Pulmonary/Chest: Effort normal and breath sounds normal.  Abdominal: Soft. Bowel sounds are normal.  Musculoskeletal: Normal range of motion.  Neurological: He is alert and oriented  to person, place, and time.  Skin:     Psychiatric: He has a normal mood and affect. His behavior is normal. Thought content normal.  Nursing note and vitals reviewed.    ED Treatments / Results  Labs (all labs ordered are listed, but only abnormal results are displayed) Labs Reviewed  CBC WITH DIFFERENTIAL/PLATELET - Abnormal; Notable for the following:       Result Value   WBC 3.8 (*)    Hemoglobin 11.1 (*)    HCT 35.8 (*)    MCV 75.1 (*)    MCH 23.3 (*)    RDW 17.2 (*)    All other components within normal limits  BASIC METABOLIC PANEL - Abnormal; Notable for the following:  Glucose, Bld 200 (*)    All other components within normal limits  CBG MONITORING, ED - Abnormal; Notable for the following:    Glucose-Capillary 175 (*)    All other components within normal limits    EKG  EKG Interpretation None       Radiology No results found.  Procedures Procedures (including critical care time)  Medications Ordered in ED Medications - No data to display   Initial Impression / Assessment and Plan / ED Course  I have reviewed the triage vital signs and the nursing notes.  Pertinent labs & imaging results that were available during my care of the patient were reviewed by me and considered in my medical decision making (see chart for details).  Clinical Course   Wound cleaned.  Wound looks good.  Pt encouraged to eat a diabetic diet and to continue all meds.  Final Clinical Impressions(s) / ED Diagnoses   Final diagnoses:  Diabetic ulcer of buttock ([place])  Poorly controlled type 2 diabetes mellitus ([place])    New Prescriptions New Prescriptions   No medications on file     Isla Pence, MD 10/03/16 [phone]

## 2016-10-03 NOTE — ED Notes (Addendum)
Wound irrigated with sterile saline and dressed with Allevyn dressing.

## 2016-10-03 NOTE — ED Triage Notes (Signed)
Reports he has a diabetic ulcer on his right hip that they have been working on, and the home nurse said to come over due to it needing debriding, reports it has been oozing a greenish/yellow color and reports it has developed an odor. Reports that they also checked blood sugar and got 222. Reports has some soreness at ulcer. Denies medication PTA other then his morning meds.

## 2016-10-03 NOTE — Discharge Instructions (Signed)
Continue current medication.  Diabetic diet.

## 2016-10-25 ENCOUNTER — Ambulatory Visit: Payer: Self-pay | Attending: Family Medicine | Admitting: Family Medicine

## 2016-10-25 ENCOUNTER — Encounter: Payer: Self-pay | Admitting: Family Medicine

## 2016-10-25 VITALS — BP 159/91 | HR 91 | Temp 98.4°F | Ht 67.0 in | Wt 199.0 lb

## 2016-10-25 DIAGNOSIS — Z89421 Acquired absence of other right toe(s): Secondary | ICD-10-CM | POA: Insufficient documentation

## 2016-10-25 DIAGNOSIS — E0800 Diabetes mellitus due to underlying condition with hyperosmolarity without nonketotic hyperglycemic-hyperosmolar coma (NKHHC): Secondary | ICD-10-CM

## 2016-10-25 DIAGNOSIS — Z794 Long term (current) use of insulin: Secondary | ICD-10-CM | POA: Insufficient documentation

## 2016-10-25 DIAGNOSIS — I10 Essential (primary) hypertension: Secondary | ICD-10-CM | POA: Insufficient documentation

## 2016-10-25 DIAGNOSIS — Z89411 Acquired absence of right great toe: Secondary | ICD-10-CM | POA: Insufficient documentation

## 2016-10-25 DIAGNOSIS — E1100 Type 2 diabetes mellitus with hyperosmolarity without nonketotic hyperglycemic-hyperosmolar coma (NKHHC): Secondary | ICD-10-CM | POA: Insufficient documentation

## 2016-10-25 DIAGNOSIS — Z79899 Other long term (current) drug therapy: Secondary | ICD-10-CM | POA: Insufficient documentation

## 2016-10-25 DIAGNOSIS — E1140 Type 2 diabetes mellitus with diabetic neuropathy, unspecified: Secondary | ICD-10-CM | POA: Insufficient documentation

## 2016-10-25 DIAGNOSIS — L0231 Cutaneous abscess of buttock: Secondary | ICD-10-CM | POA: Insufficient documentation

## 2016-10-25 LAB — GLUCOSE, POCT (MANUAL RESULT ENTRY): POC Glucose: 214 mg/dL — AB (ref 70–99)

## 2016-10-25 MED ORDER — CLINDAMYCIN HCL 300 MG PO CAPS: 300.0000 mg | ORAL_CAPSULE | Freq: Three times a day (TID) | ORAL | 0 refills | Status: DC

## 2016-10-25 MED ORDER — COLLAGENASE 250 UNIT/GM EX OINT: 1.0000 "application " | TOPICAL_OINTMENT | Freq: Every day | CUTANEOUS | 0 refills | Status: DC

## 2016-10-25 MED FILL — ?LISINOPRIL 20 MG TABLET: 20 | 30 days supply | Qty: 30 | Fill #0

## 2016-10-25 MED FILL — GABAPENTIN 400 MG CAPSULE: 400 | 30 days supply | Qty: 90 | Fill #0

## 2016-10-25 MED FILL — CLINDAMYCIN HCL 300 MG CAP: 300 | 7 days supply | Qty: 21 | Fill #0

## 2016-10-25 NOTE — Progress Notes (Signed)
Subjective:  Patient ID: Jeffrey [name], male    DOB: [birth year]  Age: 50 y.o. 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  He last saw his general surgeon in little over 3 weeks ago, no new recommendations made. He continues with daily wound dressing changes which is girlfriend Doss and also has advanced Homecare visits from the nurse once a week. Caretaker has noticed some drainage from the wound. His Lantus was increased to 55 units at his last visit and his random blood sugars have been less than 250. He denies hypoglycemia.  Past Medical History:  Diagnosis Date  . Diabetes mellitus   . Diabetic foot infection ([place]) 03/2016   RT FOOT  . HTN (hypertension)     Past Surgical History:  Procedure Laterality Date  . AMPUTATION Right 04/01/2016   Procedure: Right Great Toe Amputation;  Surgeon: Newt Minion, MD;  Location: [facility];  Service: Orthopedics;  Laterality: Right;  . AMPUTATION Right 06/19/2016   Procedure: AMPUTATION SECOND TOE;  Surgeon: Marybelle Killings, MD;  Location: [facility];  Service: Orthopedics;  Laterality: Right;  . BACK SURGERY     for abscess    No Known Allergies   Outpatient Medications Prior to Visit  Medication Sig Dispense Refill  . albuterol (PROVENTIL HFA;VENTOLIN HFA) 108 (90 Base) MCG/ACT inhaler Inhale 2 puffs into the lungs every 4 (four) hours as needed for wheezing or shortness of breath. 1 Inhaler 2  . butalbital-acetaminophen-caffeine (ESGIC) 50-325-40 MG tablet Take 1 tablet by mouth every 6 (six) hours as needed for headache. 30 tablet 0  . ferrous sulfate 325 (65 FE) MG tablet Take 1 tablet (325 mg total) by mouth 2 (two) times daily with a  meal. 60 tablet 3  . gabapentin (NEURONTIN) 400 MG capsule Take 1 capsule (400 mg total) by mouth 3 (three) times daily. 90 capsule 3  . ibuprofen (ADVIL,MOTRIN) 400 MG tablet Take 1 tablet (400 mg total) by mouth every 8 (eight) hours as needed for moderate pain. 30 tablet 0  . insulin aspart (NOVOLOG) 100 UNIT/ML injection Inject 0-15 Units into the skin 3 (three) times daily with meals. Sliding scale  CBG 70 - 120: 0 units: CBG 121 - 140: 2 units; CBG 140 - 200: 4 units; CBG 201 - 240: 6 units; CBG 240 - 300: 8 units;CBG 300 - 350: 12 units; CBG 351 - 400: 16 units; CBG > 400 : 16 units and notify your  MD 10 mL 3  . Insulin Glargine (LANTUS SOLOSTAR) 100 UNIT/ML Solostar Pen Inject 55 Units into the skin daily at 10 pm. 5 pen 3  . lactulose (CHRONULAC) 10 GM/15ML solution Take 15 mLs (10 g total) by mouth 2 (two) times daily as needed for mild constipation. 946 mL 1  . lisinopril (PRINIVIL,ZESTRIL) 20 MG tablet Take 1 tablet (20 mg total) by mouth daily. 30 tablet 3  . HYDROcodone-acetaminophen (NORCO/VICODIN) 5-325 MG tablet Take 1-2 tablets by mouth every 4 (four) hours as needed for moderate pain. (Patient not taking: Reported on 10/25/2016) 15 tablet 0  . saccharomyces boulardii (FLORASTOR) 250 MG capsule Take 1 capsule (250 mg total) by mouth 2 (two) times daily. (Patient not taking: Reported on 10/25/2016) 30  capsule 0  . clindamycin (CLEOCIN) 300 MG capsule Take 1 capsule (300 mg total) by mouth every 8 (eight) hours. (Patient taking differently: Take 300 mg by mouth 2 (two) times daily. ) 30 capsule 0   No facility-administered medications prior to visit.     ROS Review of Systems Constitutional: Negative for activity change and appetite change.  HENT: Negative for sinus pressure and sore throat.   Eyes: Negative for visual disturbance.  Respiratory: Negative for cough, chest tightness and shortness of breath.   Cardiovascular: Negative for chest pain and leg swelling.    Gastrointestinal: Negative for abdominal distention, abdominal pain, constipation and diarrhea.  Endocrine: Negative.   Genitourinary: Negative for dysuria.  Musculoskeletal: Negative for joint swelling and myalgias.  Skin:       See hpi  Allergic/Immunologic: Negative.   Neurological: Negative for weakness, light-headedness and numbness.  Psychiatric/Behavioral: Negative for dysphoric mood and suicidal ideas.   Objective:  BP (!) 159/91 (BP Location: Right Arm, Patient Position: Sitting, Cuff Size: Large)   Pulse 91   Temp 98.4 F (36.9 C) (Oral)   Ht 5\' 7"  (1.702 m)   Wt 199 lb (90.3 kg)   SpO2 98%   BMI 31.17 kg/m   BP/Weight 10/25/2016 10/03/2016 70/62/3762  Systolic BP 831 517 616  Diastolic BP 91 81 87  Wt. (Lbs) 199 187 191.4  BMI 31.17 29.29 29.98      Physical Exam Constitutional: He is oriented to person, place, and time. He appears well-developed and well-nourished.  Cardiovascular: Normal rate, normal heart sounds and intact distal pulses.   No murmur heard. Pulmonary/Chest: Effort normal and breath sounds normal. He has no wheezes. He has no rales. He exhibits no tenderness.  Abdominal: Soft. Bowel sounds are normal. He exhibits no distension and no mass. There is no tenderness.  Musculoskeletal: Normal range of motion.  Neurological: He is alert and oriented to person, place, and time.  Skin:  Right gluteal abscess cavity decreased in size, base of ulcer with minimal necrotic tissue, surrounding hyperpigmentation.  Lab Results  Component Value Date   HGBA1C 12.4 (H) 09/12/2016    Assessment & Plan:   1. Diabetes mellitus due to underlying condition, uncontrolled, with hyperosmolarity without coma, unspecified long term insulin use status (HCC) Uncontrolled with A1c of 12.4 Random blood sugars are all less than 250 which revealed some improvement Continue current dose of Lantus -55 units daily Review blood sugar log at next visit - Glucose  (CBG)  2. Abscess, gluteal, right Abscess has decreased in size but base of ulcer still with necrotic tissue Continue daily wound dressing changes and weekly visits by advanced Homecare nurse We'll place an additional dose of antibiotic. - collagenase (SANTYL) ointment; Apply 1 application topically daily.  Dispense: 90 g; Refill: 0 - clindamycin (CLEOCIN) 300 MG capsule; Take 1 capsule (300 mg total) by mouth 3 (three) times daily.  Dispense: 21 capsule; Refill: 0   Meds ordered this encounter  Medications  . collagenase (SANTYL) ointment    Sig: Apply 1 application topically daily.    Dispense:  90 g    Refill:  0  . clindamycin (CLEOCIN) 300 MG capsule    Sig: Take 1 capsule (300 mg total) by mouth 3 (three) times daily.    Dispense:  21 capsule    Refill:  0    Follow-up: Return in about 1 month (around 11/24/2016) for follow up on Diabetets mellitus.   Arnoldo Morale MD

## 2016-10-25 NOTE — Patient Instructions (Signed)
Diabetes Mellitus and Food It is important for you to manage your blood sugar (glucose) level. Your blood glucose level can be greatly affected by what you eat. Eating healthier foods in the appropriate amounts throughout the day at about the same time each day will help you control your blood glucose level. It can also help slow or prevent worsening of your diabetes mellitus. Healthy eating may even help you improve the level of your blood pressure and reach or maintain a healthy weight. General recommendations for healthful eating and cooking habits include:  Eating meals and snacks regularly. Avoid going long periods of time without eating to lose weight.  Eating a diet that consists mainly of plant-based foods, such as fruits, vegetables, nuts, legumes, and whole grains.  Using low-heat cooking methods, such as baking, instead of high-heat cooking methods, such as deep frying.  Work with your dietitian to make sure you understand how to use the Nutrition Facts information on food labels. How can food affect me? Carbohydrates Carbohydrates affect your blood glucose level more than any other type of food. Your dietitian will help you determine how many carbohydrates to eat at each meal and teach you how to count carbohydrates. Counting carbohydrates is important to keep your blood glucose at a healthy level, especially if you are using insulin or taking certain medicines for diabetes mellitus. Alcohol Alcohol can cause sudden decreases in blood glucose (hypoglycemia), especially if you use insulin or take certain medicines for diabetes mellitus. Hypoglycemia can be a life-threatening condition. Symptoms of hypoglycemia (sleepiness, dizziness, and disorientation) are similar to symptoms of having too much alcohol. If your health care provider has given you approval to drink alcohol, do so in moderation and use the following guidelines:  Women should not have more than one drink per day, and men  should not have more than two drinks per day. One drink is equal to: ? 12 oz of beer. ? 5 oz of wine. ? 1 oz of hard liquor.  Do not drink on an empty stomach.  Keep yourself hydrated. Have water, diet soda, or unsweetened iced tea.  Regular soda, juice, and other mixers might contain a lot of carbohydrates and should be counted.  What foods are not recommended? As you make food choices, it is important to remember that all foods are not the same. Some foods have fewer nutrients per serving than other foods, even though they might have the same number of calories or carbohydrates. It is difficult to get your body what it needs when you eat foods with fewer nutrients. Examples of foods that you should avoid that are high in calories and carbohydrates but low in nutrients include:  Trans fats (most processed foods list trans fats on the Nutrition Facts label).  Regular soda.  Juice.  Candy.  Sweets, such as cake, pie, doughnuts, and cookies.  Fried foods.  What foods can I eat? Eat nutrient-rich foods, which will nourish your body and keep you healthy. The food you should eat also will depend on several factors, including:  The calories you need.  The medicines you take.  Your weight.  Your blood glucose level.  Your blood pressure level.  Your cholesterol level.  You should eat a variety of foods, including:  Protein. ? Lean cuts of meat. ? Proteins low in saturated fats, such as fish, egg whites, and beans. Avoid processed meats.  Fruits and vegetables. ? Fruits and vegetables that may help control blood glucose levels, such as apples,   mangoes, and yams.  Dairy products. ? Choose fat-free or low-fat dairy products, such as milk, yogurt, and cheese.  Grains, bread, pasta, and rice. ? Choose whole grain products, such as multigrain bread, whole oats, and brown rice. These foods may help control blood pressure.  Fats. ? Foods containing healthful fats, such as  nuts, avocado, olive oil, canola oil, and fish.  Does everyone with diabetes mellitus have the same meal plan? Because every person with diabetes mellitus is different, there is not one meal plan that works for everyone. It is very important that you meet with a dietitian who will help you create a meal plan that is just right for you. This information is not intended to replace advice given to you by your health care provider. Make sure you discuss any questions you have with your health care provider. Document Released: 08/25/2005 Document Revised: 05/05/2016 Document Reviewed: 10/25/2013 Elsevier Interactive Patient Education  2017 [employer] Inc.  

## 2016-11-15 ENCOUNTER — Emergency Department (HOSPITAL_COMMUNITY): Payer: Self-pay

## 2016-11-15 ENCOUNTER — Encounter (HOSPITAL_COMMUNITY): Payer: Self-pay | Admitting: Emergency Medicine

## 2016-11-15 ENCOUNTER — Emergency Department (HOSPITAL_COMMUNITY)
Admission: EM | Admit: 2016-11-15 | Discharge: 2016-11-15 | Disposition: A | Discharge status: Home / Self Care | Payer: Self-pay | Attending: Emergency Medicine | Admitting: Emergency Medicine

## 2016-11-15 DIAGNOSIS — R0600 Dyspnea, unspecified: Secondary | ICD-10-CM | POA: Insufficient documentation

## 2016-11-15 DIAGNOSIS — I10 Essential (primary) hypertension: Secondary | ICD-10-CM | POA: Insufficient documentation

## 2016-11-15 DIAGNOSIS — Z794 Long term (current) use of insulin: Secondary | ICD-10-CM | POA: Insufficient documentation

## 2016-11-15 DIAGNOSIS — R05 Cough: Secondary | ICD-10-CM | POA: Insufficient documentation

## 2016-11-15 DIAGNOSIS — E1140 Type 2 diabetes mellitus with diabetic neuropathy, unspecified: Secondary | ICD-10-CM | POA: Insufficient documentation

## 2016-11-15 DIAGNOSIS — R059 Cough, unspecified: Secondary | ICD-10-CM

## 2016-11-15 DIAGNOSIS — Z79899 Other long term (current) drug therapy: Secondary | ICD-10-CM | POA: Insufficient documentation

## 2016-11-15 LAB — BASIC METABOLIC PANEL
ANION GAP: 7 (ref 5–15)
BUN: 17 mg/dL (ref 6–20)
CHLORIDE: 107 mmol/L (ref 101–111)
CO2: 22 mmol/L (ref 22–32)
Calcium: 8.7 mg/dL — ABNORMAL LOW (ref 8.9–10.3)
Creatinine, Ser: 0.74 mg/dL (ref 0.61–1.24)
Glucose, Bld: 228 mg/dL — ABNORMAL HIGH (ref 65–99)
POTASSIUM: 3.7 mmol/L (ref 3.5–5.1)
SODIUM: 136 mmol/L (ref 135–145)

## 2016-11-15 LAB — CBC WITH DIFFERENTIAL/PLATELET
BASOS ABS: 0 10*3/uL (ref 0.0–0.1)
BASOS PCT: 0 %
EOS ABS: 0.1 10*3/uL (ref 0.0–0.7)
EOS PCT: 1 %
HCT: 36.5 % — ABNORMAL LOW (ref 39.0–52.0)
HEMOGLOBIN: 11.8 g/dL — AB (ref 13.0–17.0)
Lymphocytes Relative: 33 %
Lymphs Abs: 1.8 10*3/uL (ref 0.7–4.0)
MCH: 24.1 pg — AB (ref 26.0–34.0)
MCHC: 32.3 g/dL (ref 30.0–36.0)
MCV: 74.6 fL — ABNORMAL LOW (ref 78.0–100.0)
Monocytes Absolute: 0.3 10*3/uL (ref 0.1–1.0)
Monocytes Relative: 6 %
NEUTROS PCT: 60 %
Neutro Abs: 3.3 10*3/uL (ref 1.7–7.7)
PLATELETS: 277 10*3/uL (ref 150–400)
RBC: 4.89 MIL/uL (ref 4.22–5.81)
RDW: 16.8 % — ABNORMAL HIGH (ref 11.5–15.5)
WBC: 5.5 10*3/uL (ref 4.0–10.5)

## 2016-11-15 LAB — I-STAT TROPONIN, ED: TROPONIN I, POC: 0.02 ng/mL (ref 0.00–0.08)

## 2016-11-15 LAB — BRAIN NATRIURETIC PEPTIDE: B NATRIURETIC PEPTIDE 5: 80.1 pg/mL (ref 0.0–100.0)

## 2016-11-15 LAB — CBG MONITORING, ED: GLUCOSE-CAPILLARY: 262 mg/dL — AB (ref 65–99)

## 2016-11-15 MED ORDER — ALBUTEROL SULFATE HFA 108 (90 BASE) MCG/ACT IN AERS: 1.0000 | INHALATION_SPRAY | Freq: Four times a day (QID) | RESPIRATORY_TRACT | 0 refills | Status: DC | PRN

## 2016-11-15 MED ORDER — IPRATROPIUM-ALBUTEROL 0.5-2.5 (3) MG/3ML IN SOLN
3.0000 mL | Freq: Once | RESPIRATORY_TRACT | Status: AC
  Administered 2016-11-15: 3 mL via RESPIRATORY_TRACT
  Filled 2016-11-15: qty 3

## 2016-11-15 MED ORDER — INSULIN ASPART PROT & ASPART (70-30 MIX) 100 UNIT/ML ~~LOC~~ SUSP
8.0000 [IU] | Freq: Once | SUBCUTANEOUS | Status: AC
  Administered 2016-11-15: 8 [IU] via SUBCUTANEOUS
  Filled 2016-11-15: qty 10

## 2016-11-15 NOTE — ED Notes (Signed)
PT DISCHARGED. INSTRUCTIONS AND PRESCRIPTION GIVEN. AAOX4. PT IN NO APPARENT DISTRESS OR PAIN. THE OPPORTUNITY TO ASK QUESTIONS WAS PROVIDED. 

## 2016-11-15 NOTE — ED Triage Notes (Signed)
Patient reports shortness of breath since yesterday. Patient also wants his blood glucose checked.

## 2016-11-15 NOTE — ED Provider Notes (Signed)
[facility] DEPT Provider Note   CSN: [id number] Arrival date & time: 11/15/16  1429     History   Chief Complaint Chief Complaint  Patient presents with  . Shortness of Breath    HPI Jeffrey [name] is a 50 y.o. male who presents with shortness of breath. Past medical history significant for insulin-dependent diabetes complicated by neuropathy, hypertension, poor wound healing. Patient is a poor historian. He states over the past week he has had worsening shortness of breath which started all of a sudden when he was pushing a cart up a hill. He states that the shortness of breath is at rest but it is worse with exertion. He reports a dry cough and chest tightness as well. He was prescribed an inhaler but is unable to tell me why. Denies history of any heart or lung disease. He does not smoke. Denies fever, chills, chest pain, leg swelling, palpitations, wheezing, abdominal pain, nausea, vomiting. He has a healing wound on the right hip which is been debrided. He is currently on antibiotics for this.  HPI  Past Medical History:  Diagnosis Date  . Diabetes mellitus   . Diabetic foot infection ([place]) 03/2016   RT FOOT  . HTN (hypertension)     Patient Active Problem List   Diagnosis Date Noted  . Uncontrolled diabetes mellitus ([place]) 09/12/2016  . Abscess, gluteal, right 09/12/2016  . Folliculitis 27/51/7001  . Hyponatremia 06/18/2016  . Hypokalemia 06/18/2016  . 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 ([place]) 74/94/4967  . Anemia, iron deficiency   . Noncompliance with medication regimen 03/30/2016  . Uncontrolled type 2 diabetes mellitus with hyperglycemia, with long-term current use of insulin ([place])   . Cellulitis of right foot 02/19/2016  . Diabetic foot infection ([place]) 02/18/2016  . Cellulitis 09/25/2015  . Cellulitis of back   . Essential hypertension 08/21/2013  . Diabetic neuropathy ([place]) 08/21/2013  . Blurry vision 08/21/2013  . Fungal toenail  infection 08/21/2013    Past Surgical History:  Procedure Laterality Date  . AMPUTATION Right 04/01/2016   Procedure: Right Great Toe Amputation;  Surgeon: Newt Minion, MD;  Location: [facility];  Service: Orthopedics;  Laterality: Right;  . AMPUTATION Right 06/19/2016   Procedure: AMPUTATION SECOND TOE;  Surgeon: Marybelle Killings, MD;  Location: [facility];  Service: Orthopedics;  Laterality: Right;  . BACK SURGERY     for abscess       Home Medications    Prior to Admission medications   Medication Sig Start Date End Date Taking? Authorizing Provider  albuterol (PROVENTIL HFA;VENTOLIN HFA) 108 (90 Base) MCG/ACT inhaler Inhale 2 puffs into the lungs every 4 (four) hours as needed for wheezing or shortness of breath. 05/09/16   Orpah Greek, MD  butalbital-acetaminophen-caffeine (ESGIC) 50-325-40 MG tablet Take 1 tablet by mouth every 6 (six) hours as needed for headache. 04/13/16   Arnoldo Morale, MD  clindamycin (CLEOCIN) 300 MG capsule Take 1 capsule (300 mg total) by mouth 3 (three) times daily. 10/25/16   Arnoldo Morale, MD  collagenase (SANTYL) ointment Apply 1 application topically daily. 10/25/16   Arnoldo Morale, MD  ferrous sulfate 325 (65 FE) MG tablet Take 1 tablet (325 mg total) by mouth 2 (two) times daily with a meal. 04/03/16   Nishant Dhungel, MD  gabapentin (NEURONTIN) 400 MG capsule Take 1 capsule (400 mg total) by mouth 3 (three) times daily. 09/28/16   Arnoldo Morale, MD  HYDROcodone-acetaminophen (NORCO/VICODIN) 5-325 MG tablet Take 1-2  tablets by mouth every 4 (four) hours as needed for moderate pain. Patient not taking: Reported on 10/25/2016 09/15/16   Bonnielee Haff, MD  ibuprofen (ADVIL,MOTRIN) 400 MG tablet Take 1 tablet (400 mg total) by mouth every 8 (eight) hours as needed for moderate pain. 09/15/16   Bonnielee Haff, MD  insulin aspart (NOVOLOG) 100 UNIT/ML injection Inject 0-15 Units into the skin 3 (three) times daily with meals. Sliding scale  CBG 70 - 120: 0 units: CBG  121 - 140: 2 units; CBG 140 - 200: 4 units; CBG 201 - 240: 6 units; CBG 240 - 300: 8 units;CBG 300 - 350: 12 units; CBG 351 - 400: 16 units; CBG > 400 : 16 units and notify your  MD 09/28/16   Arnoldo Morale, MD  Insulin Glargine (LANTUS SOLOSTAR) 100 UNIT/ML Solostar Pen Inject 55 Units into the skin daily at 10 pm. 09/28/16   Arnoldo Morale, MD  lactulose (CHRONULAC) 10 GM/15ML solution Take 15 mLs (10 g total) by mouth 2 (two) times daily as needed for mild constipation. 02/24/16   Arnoldo Morale, MD  lisinopril (PRINIVIL,ZESTRIL) 20 MG tablet Take 1 tablet (20 mg total) by mouth daily. 09/28/16   Arnoldo Morale, MD  saccharomyces boulardii (FLORASTOR) 250 MG capsule Take 1 capsule (250 mg total) by mouth 2 (two) times daily. Patient not taking: Reported on 10/25/2016 09/15/16   Bonnielee Haff, MD    Family History Family History  Problem Relation Age of Onset  . Diabetes Mother   . Hypertension Mother   . Diabetes Father   . Heart attack Father   . Diabetes Sister     Social History Social History  Substance Use Topics  . Smoking status: Never Smoker  . Smokeless tobacco: Never Used  . Alcohol use No     Allergies   Patient has no known allergies.   Review of Systems Review of Systems  Constitutional: Negative for chills and fever.  HENT: Negative for ear pain, rhinorrhea and sore throat.   Respiratory: Positive for cough, chest tightness and shortness of breath. Negative for wheezing.   Cardiovascular: Negative for chest pain, palpitations and leg swelling.  Gastrointestinal: Negative for abdominal pain, nausea and vomiting.  All other systems reviewed and are negative.    Physical Exam Updated Vital Signs BP 141/93 (BP Location: Right Arm)   Pulse 92   Temp 98.1 F (36.7 C) (Oral)   Resp 16   Ht 5\' 7"  (1.702 m)   Wt 90.3 kg   SpO2 96%   BMI 31.17 kg/m   Physical Exam  Constitutional: He is oriented to person, place, and time. He appears well-developed and  well-nourished. No distress.  HENT:  Head: Normocephalic and atraumatic.  Eyes: Conjunctivae are normal. Pupils are equal, round, and reactive to light. Right eye exhibits no discharge. Left eye exhibits no discharge. No scleral icterus.  Neck: Normal range of motion.  Cardiovascular: Normal rate and regular rhythm.  Exam reveals no gallop and no friction rub.   No murmur heard. Pulmonary/Chest: Effort normal and breath sounds normal. No respiratory distress. He has no wheezes. He has no rales. He exhibits no tenderness.  Abdominal: Soft. Bowel sounds are normal. He exhibits no distension and no mass. There is no tenderness. There is no rebound and no guarding. No hernia.  Musculoskeletal:  S/p amputation of right 1st and 2nd toes.  Neurological: He is alert and oriented to person, place, and time.  Skin: Skin is warm and dry.  2x2 healing ulcer of the right buttock/hip. Pink granulation tissue noted without purulent drainage or significant redness  Psychiatric: He has a normal mood and affect. His behavior is normal.  Nursing note and vitals reviewed.    ED Treatments / Results  Labs (all labs ordered are listed, but only abnormal results are displayed) Labs Reviewed  BASIC METABOLIC PANEL - Abnormal; Notable for the following:       Result Value   Glucose, Bld 228 (*)    Calcium 8.7 (*)    All other components within normal limits  CBC WITH DIFFERENTIAL/PLATELET - Abnormal; Notable for the following:    Hemoglobin 11.8 (*)    HCT 36.5 (*)    MCV 74.6 (*)    MCH 24.1 (*)    RDW 16.8 (*)    All other components within normal limits  CBG MONITORING, ED - Abnormal; Notable for the following:    Glucose-Capillary 262 (*)    All other components within normal limits  CBG MONITORING, ED - Abnormal; Notable for the following:    Glucose-Capillary 166 (*)    All other components within normal limits  BRAIN NATRIURETIC PEPTIDE  I-STAT TROPOININ, ED    EKG  EKG  Interpretation  Date/Time:  Tuesday November 15 2016 14:42:34 EST Ventricular Rate:  90 PR Interval:    QRS Duration: 85 QT Interval:  392 QTC Calculation: 480 R Axis:   12 Text Interpretation:  Sinus rhythm Abnormal T, consider ischemia, lateral leads Baseline wander in lead(s) I Confirmed by Christy Gentles  MD, DONALD ([id number]) on 11/16/2016 1:05:04 PM       Radiology Dg Chest 2 View  Result Date: 11/15/2016 CLINICAL DATA:  Shortness of breath and chest tightness. EXAM: CHEST  2 VIEW COMPARISON:  05/09/2016 FINDINGS: The cardiac silhouette remains borderline enlarged, unchanged. Pulmonary vascular congestion has mildly increased. There is minimal atelectasis or scarring in the left lung base. No overt alveolar edema, pleural effusion, or pneumothorax is identified. No acute osseous abnormality is seen. IMPRESSION: Mildly increased pulmonary vascular congestion. Electronically Signed   By: Logan Bores M.D.   On: 11/15/2016 15:15    Procedures Procedures (including critical care time)  Medications Ordered in ED Medications  insulin aspart protamine- aspart (NOVOLOG MIX 70/30) injection 8 Units (8 Units Subcutaneous Given 11/15/16 1748)  ipratropium-albuterol (DUONEB) 0.5-2.5 (3) MG/3ML nebulizer solution 3 mL (3 mLs Nebulization Given 11/15/16 1810)     Initial Impression / Assessment and Plan / ED Course  I have reviewed the triage vital signs and the nursing notes.  Pertinent labs & imaging results that were available during my care of the patient were reviewed by me and considered in my medical decision making (see chart for details).  Clinical Course    50 year old male presents with SOB and cough. He is hypertensive but otherwise vitals are normal. CBC remarkable for mild anemia. BMP remarkable for glucose 228. BNP normal. Tropnin is 0.02. EKG NSR. CXR remarkable for mildly increased vascular congestion. Duoneb treatment given. Dose of home insulin given since he has not taken it  today.  On recheck patient reports symptomatic improvement. Will refill inhaler. Advised PCP follow up. Patient is NAD, non-toxic. Patient is informed of clinical course, understands medical decision making process, and agrees with plan. Opportunity for questions provided and all questions answered. Return precautions given.   Final Clinical Impressions(s) / ED Diagnoses   Final diagnoses:  Cough  Dyspnea, unspecified type    New Prescriptions Discharge Medication  List as of 11/15/2016  7:41 PM       Recardo Evangelist, PA-C 11/20/16 [place], MD 11/23/16 [phone]

## 2016-11-16 LAB — CBG MONITORING, ED: GLUCOSE-CAPILLARY: 166 mg/dL — AB (ref 65–99)

## 2016-12-30 MED FILL — GABAPENTIN 400 MG CAPSULE: 400 | 30 days supply | Qty: 90 | Fill #1

## 2016-12-30 MED FILL — ?LISINOPRIL 20 MG TABLET: 20 | 30 days supply | Qty: 30 | Fill #1

## 2017-01-28 ENCOUNTER — Emergency Department (HOSPITAL_COMMUNITY): Payer: Self-pay

## 2017-01-28 ENCOUNTER — Encounter (HOSPITAL_COMMUNITY): Payer: Self-pay | Admitting: Emergency Medicine

## 2017-01-28 ENCOUNTER — Inpatient Hospital Stay (HOSPITAL_COMMUNITY)
Admission: EM | Admit: 2017-01-28 | Discharge: 2017-02-02 | DRG: 287 | Disposition: A | Discharge status: Home / Self Care | Payer: Self-pay | Attending: Internal Medicine | Admitting: Internal Medicine

## 2017-01-28 DIAGNOSIS — I2510 Atherosclerotic heart disease of native coronary artery without angina pectoris: Secondary | ICD-10-CM | POA: Diagnosis present

## 2017-01-28 DIAGNOSIS — E876 Hypokalemia: Secondary | ICD-10-CM | POA: Diagnosis present

## 2017-01-28 DIAGNOSIS — I110 Hypertensive heart disease with heart failure: Principal | ICD-10-CM | POA: Diagnosis present

## 2017-01-28 DIAGNOSIS — I5022 Chronic systolic (congestive) heart failure: Secondary | ICD-10-CM

## 2017-01-28 DIAGNOSIS — E1140 Type 2 diabetes mellitus with diabetic neuropathy, unspecified: Secondary | ICD-10-CM | POA: Diagnosis present

## 2017-01-28 DIAGNOSIS — Z8249 Family history of ischemic heart disease and other diseases of the circulatory system: Secondary | ICD-10-CM

## 2017-01-28 DIAGNOSIS — Z89411 Acquired absence of right great toe: Secondary | ICD-10-CM

## 2017-01-28 DIAGNOSIS — I5023 Acute on chronic systolic (congestive) heart failure: Secondary | ICD-10-CM | POA: Diagnosis present

## 2017-01-28 DIAGNOSIS — D509 Iron deficiency anemia, unspecified: Secondary | ICD-10-CM | POA: Diagnosis present

## 2017-01-28 DIAGNOSIS — I10 Essential (primary) hypertension: Secondary | ICD-10-CM | POA: Diagnosis present

## 2017-01-28 DIAGNOSIS — Z89421 Acquired absence of other right toe(s): Secondary | ICD-10-CM

## 2017-01-28 DIAGNOSIS — I5042 Chronic combined systolic (congestive) and diastolic (congestive) heart failure: Secondary | ICD-10-CM

## 2017-01-28 DIAGNOSIS — I509 Heart failure, unspecified: Secondary | ICD-10-CM

## 2017-01-28 DIAGNOSIS — E1165 Type 2 diabetes mellitus with hyperglycemia: Secondary | ICD-10-CM

## 2017-01-28 DIAGNOSIS — I428 Other cardiomyopathies: Secondary | ICD-10-CM | POA: Diagnosis present

## 2017-01-28 DIAGNOSIS — Z794 Long term (current) use of insulin: Secondary | ICD-10-CM

## 2017-01-28 DIAGNOSIS — Z833 Family history of diabetes mellitus: Secondary | ICD-10-CM

## 2017-01-28 HISTORY — DX: Chronic systolic (congestive) heart failure: I50.22

## 2017-01-28 LAB — COMPREHENSIVE METABOLIC PANEL
ALT: 14 U/L — ABNORMAL LOW (ref 17–63)
AST: 16 U/L (ref 15–41)
Albumin: 3.2 g/dL — ABNORMAL LOW (ref 3.5–5.0)
Alkaline Phosphatase: 74 U/L (ref 38–126)
Anion gap: 6 (ref 5–15)
BUN: 10 mg/dL (ref 6–20)
CO2: 23 mmol/L (ref 22–32)
Calcium: 9 mg/dL (ref 8.9–10.3)
Chloride: 111 mmol/L (ref 101–111)
Creatinine, Ser: 0.82 mg/dL (ref 0.61–1.24)
GFR calc Af Amer: >60 mL/min (ref >60)
GFR calc non Af Amer: >60 mL/min (ref >60)
Glucose, Bld: 283 mg/dL — ABNORMAL HIGH (ref 65–99)
Potassium: 3.5 mmol/L (ref 3.5–5.1)
Sodium: 140 mmol/L (ref 135–145)
Total Bilirubin: 0.5 mg/dL (ref 0.3–1.2)
Total Protein: 7.3 g/dL (ref 6.5–8.1)

## 2017-01-28 LAB — CBC WITH DIFFERENTIAL/PLATELET
BASOS PCT: 0 %
Basophils Absolute: 0 10*3/uL (ref 0.0–0.1)
EOS ABS: 0 10*3/uL (ref 0.0–0.7)
Eosinophils Relative: 1 %
HCT: 36.4 % — ABNORMAL LOW (ref 39.0–52.0)
HEMOGLOBIN: 11.5 g/dL — AB (ref 13.0–17.0)
LYMPHS ABS: 1.3 10*3/uL (ref 0.7–4.0)
Lymphocytes Relative: 28 %
MCH: 23.4 pg — AB (ref 26.0–34.0)
MCHC: 31.6 g/dL (ref 30.0–36.0)
MCV: 74 fL — ABNORMAL LOW (ref 78.0–100.0)
MONO ABS: 0.3 10*3/uL (ref 0.1–1.0)
MONOS PCT: 6 %
NEUTROS PCT: 65 %
Neutro Abs: 3.1 10*3/uL (ref 1.7–7.7)
Platelets: 292 10*3/uL (ref 150–400)
RBC: 4.92 MIL/uL (ref 4.22–5.81)
RDW: 15.7 % — AB (ref 11.5–15.5)
WBC: 4.7 10*3/uL (ref 4.0–10.5)

## 2017-01-28 LAB — TROPONIN I: Troponin I: <0.03 ng/mL (ref <0.03)

## 2017-01-28 LAB — BRAIN NATRIURETIC PEPTIDE: B NATRIURETIC PEPTIDE 5: 206.5 pg/mL — AB (ref 0.0–100.0)

## 2017-01-28 MED ORDER — SODIUM CHLORIDE 0.9 % IV SOLN
INTRAVENOUS | Status: DC
  Administered 2017-01-28: 21:00:00 via INTRAVENOUS

## 2017-01-28 MED ORDER — FUROSEMIDE 10 MG/ML IJ SOLN
40.0000 mg | Freq: Once | INTRAMUSCULAR | Status: AC
  Administered 2017-01-28: 40 mg via INTRAVENOUS
  Filled 2017-01-28: qty 4

## 2017-01-28 NOTE — ED Triage Notes (Signed)
Pt began having cough and chills with some ShOB for the last week or so that is not improving

## 2017-01-28 NOTE — H&P (Signed)
History and Physical  Patient Name: Jeffrey [name]     WCB:[id number]    DOB: 01-13-1966    DOA: 01/28/2017 PCP: Arnoldo Morale, MD   Patient coming from: Home  Chief Complaint: Cough, chest congestion, orthopnea, DOE  HPI: TERRICK ALLRED is a 51 y.o. male with a past medical history significant for IDDM c/b neuropathy and toe amputations, and HTN who presents with 1 week dyspnea on exertion and cough.  He was in his usual state of health until about a week or so ago when he developed cough and chest congestion. He thought it was a upper respiratory infection, so he tried drinking more water, and taking OTC cough meds, but none of these helped at all. His symptoms slowly progressed over the last week, until he had some noticeable dyspnea on exertion, feeling of smothering when he lays flat, relieved with sitting up, and worse cough, so finally he came into the ER. He's had no chest pain, no fever, no chills, no nasal congestion, no rhinorrhea.  ED course: -Afebrile, heart rate 98, respirations normal, pulse ox low-normal, blood pressure 160/103 -Na 140, K 3.5, Cr 0.82, WBC 4.7K, Hgb 11.5 and microcytic, at baseline -BNP 206 -Troponin 0.03 -Chest x-ray showed perihilar edema -ECG showed sinus tachycardia, no change from previous -He was given IV furosemide and TRH were asked evaluate for new onset CHF     ROS: Review of Systems  Constitutional: Negative for chills, fever and malaise/fatigue.  HENT: Negative for congestion, sinus pain and sore throat.   Respiratory: Positive for cough and shortness of breath. Negative for sputum production.   Cardiovascular: Positive for orthopnea, leg swelling and PND. Negative for chest pain.  Gastrointestinal: Positive for vomiting. Negative for abdominal pain, blood in stool, constipation, diarrhea, melena and nausea.  All other systems reviewed and are negative.         Past Medical History:  Diagnosis Date  . Diabetes mellitus   .  Diabetic foot infection ([place]) 03/2016   RT FOOT  . HTN (hypertension)     Past Surgical History:  Procedure Laterality Date  . AMPUTATION Right 04/01/2016   Procedure: Right Great Toe Amputation;  Surgeon: Newt Minion, MD;  Location: [facility];  Service: Orthopedics;  Laterality: Right;  . AMPUTATION Right 06/19/2016   Procedure: AMPUTATION SECOND TOE;  Surgeon: Marybelle Killings, MD;  Location: [facility];  Service: Orthopedics;  Laterality: Right;  . BACK SURGERY     for abscess    Social History: Patient lives with his girl friend.  The patient walks unassisted.  He works for the city.  He does not smoke.  He is from [place].    No Known Allergies  Family history: family history includes Diabetes in his father, mother, and sister; Heart attack in his father and maternal grandmother; Hypertension in his mother.  Prior to Admission medications   Medication Sig Start Date End Date Taking? Authorizing Provider  albuterol (PROVENTIL HFA;VENTOLIN HFA) 108 (90 Base) MCG/ACT inhaler Inhale 1-2 puffs into the lungs every 6 (six) hours as needed for wheezing or shortness of breath. 11/15/16  Yes Recardo Evangelist, PA-C  butalbital-acetaminophen-caffeine (ESGIC) [phone] MG tablet Take 1 tablet by mouth every 6 (six) hours as needed for headache. 04/13/16  Yes Arnoldo Morale, MD  clindamycin (CLEOCIN) 300 MG capsule Take 1 capsule (300 mg total) by mouth 3 (three) times daily. 10/25/16  Yes Arnoldo Morale, MD  ferrous sulfate 325 (65 FE) MG tablet Take 1  tablet (325 mg total) by mouth 2 (two) times daily with a meal. 04/03/16  Yes Nishant Dhungel, MD  gabapentin (NEURONTIN) 400 MG capsule Take 1 capsule (400 mg total) by mouth 3 (three) times daily. 09/28/16  Yes Arnoldo Morale, MD  ibuprofen (ADVIL,MOTRIN) 400 MG tablet Take 1 tablet (400 mg total) by mouth every 8 (eight) hours as needed for moderate pain. 09/15/16  Yes Bonnielee Haff, MD  insulin aspart (NOVOLOG) 100 UNIT/ML injection Inject 0-15 Units into the  skin 3 (three) times daily with meals. Sliding scale  CBG 70 - 120: 0 units: CBG 121 - 140: 2 units; CBG 140 - 200: 4 units; CBG 201 - 240: 6 units; CBG 240 - 300: 8 units;CBG 300 - 350: 12 units; CBG 351 - 400: 16 units; CBG > 400 : 16 units and notify your  MD 09/28/16  Yes Arnoldo Morale, MD  Insulin Glargine (LANTUS SOLOSTAR) 100 UNIT/ML Solostar Pen Inject 55 Units into the skin daily at 10 pm. 09/28/16  Yes Arnoldo Morale, MD  lactulose (CHRONULAC) 10 GM/15ML solution Take 15 mLs (10 g total) by mouth 2 (two) times daily as needed for mild constipation. 02/24/16  Yes Arnoldo Morale, MD  lisinopril (PRINIVIL,ZESTRIL) 20 MG tablet Take 1 tablet (20 mg total) by mouth daily. 09/28/16  Yes Arnoldo Morale, MD       Physical Exam: BP (!) 153/104 (BP Location: Right Arm)   Pulse 92   Temp 99 F (37.2 C) (Oral)   Resp 20   Ht 5\' 7"  (1.702 m)   Wt 88.5 kg (195 lb)   SpO2 96%   BMI 30.54 kg/m  General appearance: Well-developed, adult male, alert and in no acute distress.   Eyes: Anicteric, conjunctiva pink, lids and lashes normal. PERRL.    ENT: No nasal deformity, discharge, epistaxis.  Hearing normal. OP moist without lesions.   Neck: No neck masses.  Trachea midline.  No thyromegaly/tenderness. Lymph: No cervical or supraclavicular lymphadenopathy. Skin: Warm and dry.  No suspicious rashes or lesions. Cardiac: RRR, nl S1-S2, no murmurs appreciated.  Capillary refill is brisk.  JVP normal.  Trace pretibial LE edema.  Radial and DP pulses 2+ and symmetric. Respiratory: Normal respiratory rate and rhythm.  No wheezes.  Bibasilar rales. Abdomen: Abdomen soft.  No TTP. No ascites, distension, hepatosplenomegaly.   MSK: No deformities or effusions.  No cyanosis or clubbing. Neuro: Cranial nerves nromal.  Sensation intact to light touch. Speech is fluent.  Muscle strength normal.    Psych: Sensorium intact and responding to questions, attention normal.  Behavior appropriate.  Affect normal.  Judgment  and insight appear normal.     Labs on Admission:  I have personally reviewed following labs and imaging studies: CBC:  Recent Labs Lab 01/28/17 2127  WBC 4.7  NEUTROABS 3.1  HGB 11.5*  HCT 36.4*  MCV 74.0*  PLT 342   Basic Metabolic Panel:  Recent Labs Lab 01/28/17 2127  NA 140  K 3.5  CL 111  CO2 23  GLUCOSE 283*  BUN 10  CREATININE 0.82  CALCIUM 9.0   GFR: Estimated Creatinine Clearance: 113.2 mL/min (by C-G formula based on SCr of 0.82 mg/dL).  Liver Function Tests:  Recent Labs Lab 01/28/17 2127  AST 16  ALT 14*  ALKPHOS 74  BILITOT 0.5  PROT 7.3  ALBUMIN 3.2*   No results for input(s): LIPASE, AMYLASE in the last 168 hours. No results for input(s): AMMONIA in the last 168 hours. Coagulation Profile:  No results for input(s): INR, PROTIME in the last 168 hours. Cardiac Enzymes:  Recent Labs Lab 01/28/17 2127  TROPONINI <0.03   BNP (last 3 results) No results for input(s): PROBNP in the last 8760 hours. HbA1C: No results for input(s): HGBA1C in the last 72 hours. CBG: No results for input(s): GLUCAP in the last 168 hours. Lipid Profile: No results for input(s): CHOL, HDL, LDLCALC, TRIG, CHOLHDL, LDLDIRECT in the last 72 hours. Thyroid Function Tests: No results for input(s): TSH, T4TOTAL, FREET4, T3FREE, THYROIDAB in the last 72 hours. Anemia Panel: No results for input(s): VITAMINB12, FOLATE, FERRITIN, TIBC, IRON, RETICCTPCT in the last 72 hours. Sepsis Labs: Invalid input(s): PROCALCITONIN, LACTICIDVEN No results found for this or any previous visit (from the past 240 hour(s)).       Radiological Exams on Admission: Personally reviewed CXR shows perihilar opacities, consistent with edema: Dg Chest 2 View  Result Date: 01/28/2017 CLINICAL DATA:  Patient with cough and chills.  Shortness of breath. EXAM: CHEST  2 VIEW COMPARISON:  Chest radiograph 11/15/2016 FINDINGS: Cardiomegaly. Tortuosity of the thoracic aorta. Pulmonary vascular  redistribution and interstitial opacities bilaterally. Thoracic spine degenerative changes. No pleural effusion or pneumothorax. IMPRESSION: Cardiomegaly and mild interstitial edema. Electronically Signed   By: Lovey Newcomer M.D.   On: 01/28/2017 20:30    EKG: Independently reviewed. Rate 93, QTc 463, sinus tachycardia, mild T wave changes in lateral leads, no change from previous, no ST changes.          Assessment/Plan  1. New onset acute CHF:  Unclear type.  No previous CHF that I see.  Precipitants include high salt diet, maybe URI, probably OTC cold medicine.  Check thyroid and trops and echo. -Furosemide 40 mg IV twice a day -Potassium supplement -Echocardiogram ordered -Repeat troponin -Check TSH mag -Strict I's and O's -Daily BMP -Continue ACEi -Monitor BP   2. Insulin-dependent diabetes:  Hyperglycemic at admission. -Glargine 55 nightly -SSI with meals  3. Essential hypertension:  Hypertensive at admission. -Continue lisinopril -Start furosemide  4. Microcytic anemia, presumed iron deficiency:  States he takes iron at home. Odd then that he is still anemic. -Ferritin and iron studies -Needs outpatient colonoscopy if he hasn't had one      DVT prophylaxis: Lovenox  Code Status: FULL  Family Communication: Mother at bedside, girlfriend by phone  Disposition Plan: Anticipate IV diuresis, echocardiogram.  Further dispo after echo. Consults called: None Admission status: INPATIENT       Medical decision making: Patient seen at 11:25 PM on 01/28/2017.  The patient was discussed with Dr. Zenia Resides.  What exists of the patient's chart was reviewed in depth and summarized above.  Clinical condition: stable.        Edwin Dada [facility] Pager [phone]     At the time of admission, it appears that the appropriate admission status for this patient is INPATIENT. This is judged to be reasonable and necessary in order to provide the  required intensity of service to ensure the patient's safety given the presenting symptoms, physical exam findings, and initial radiographic and laboratory data in the context of their chronic comorbidities.  Together, these circumstances are felt to place him at high risk for further clinical deterioration threatening life, limb, or organ.   Patient requires inpatient status due to high intensity of service, high risk for further deterioration and high frequency of surveillance required because of this NEW episode of organ failure CHF.  I certify that at the point  of admission it is my clinical judgment that the patient will require inpatient hospital care spanning beyond 2 midnights from the point of admission and that early discharge would result in unnecessary risk of decompensation and readmission or threat to life, limb or bodily function.

## 2017-01-28 NOTE — ED Provider Notes (Signed)
[facility] DEPT Provider Note   CSN: [id number] Arrival date & time: 01/28/17  1945     History   Chief Complaint Chief Complaint  Patient presents with  . Cough  . Shortness of Breath  . Chest Pain    HPI Jeffrey [name] is a 51 y.o. male.  52 year old male presents with increased dyspnea on exertion 1 week with associated cough as well as orthopnea. Denies any chest pain or chest pressure. No fever or chills. No vomiting or diarrhea. Does have a history of diabetes as well as high blood pressure and states compliance with his medications. Denies any prior history of CAD or CHF. Slight lower extremity bilateral edema noted. Symptoms have been persistently worse over the last week and no treatment use prior to arrival. They do improve with rest.      Past Medical History:  Diagnosis Date  . Diabetes mellitus   . Diabetic foot infection ([place]) 03/2016   RT FOOT  . HTN (hypertension)     Patient Active Problem List   Diagnosis Date Noted  . Uncontrolled diabetes mellitus ([place]) 09/12/2016  . Abscess, gluteal, right 09/12/2016  . Folliculitis 37/48/2707  . Hyponatremia 06/18/2016  . Hypokalemia 06/18/2016  . 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 ([facility]) 86/75/4492  . Anemia, iron deficiency   . Noncompliance with medication regimen 03/30/2016  . Uncontrolled type 2 diabetes mellitus with hyperglycemia, with long-term current use of insulin ([place])   . Cellulitis of right foot 02/19/2016  . Diabetic foot infection ([place]) 02/18/2016  . Cellulitis 09/25/2015  . Cellulitis of back   . Essential hypertension 08/21/2013  . Diabetic neuropathy ([place]) 08/21/2013  . Blurry vision 08/21/2013  . Fungal toenail infection 08/21/2013    Past Surgical History:  Procedure Laterality Date  . AMPUTATION Right 04/01/2016   Procedure: Right Great Toe Amputation;  Surgeon: Newt Minion, MD;  Location: [facility];  Service: Orthopedics;  Laterality: Right;  . AMPUTATION  Right 06/19/2016   Procedure: AMPUTATION SECOND TOE;  Surgeon: Marybelle Killings, MD;  Location: [facility];  Service: Orthopedics;  Laterality: Right;  . BACK SURGERY     for abscess       Home Medications    Prior to Admission medications   Medication Sig Start Date End Date Taking? Authorizing Provider  albuterol (PROVENTIL HFA;VENTOLIN HFA) 108 (90 Base) MCG/ACT inhaler Inhale 1-2 puffs into the lungs every 6 (six) hours as needed for wheezing or shortness of breath. 11/15/16   Recardo Evangelist, PA-C  butalbital-acetaminophen-caffeine (ESGIC) [phone] MG tablet Take 1 tablet by mouth every 6 (six) hours as needed for headache. 04/13/16   Arnoldo Morale, MD  clindamycin (CLEOCIN) 300 MG capsule Take 1 capsule (300 mg total) by mouth 3 (three) times daily. 10/25/16   Arnoldo Morale, MD  ferrous sulfate 325 (65 FE) MG tablet Take 1 tablet (325 mg total) by mouth 2 (two) times daily with a meal. 04/03/16   Nishant Dhungel, MD  gabapentin (NEURONTIN) 400 MG capsule Take 1 capsule (400 mg total) by mouth 3 (three) times daily. 09/28/16   Arnoldo Morale, MD  ibuprofen (ADVIL,MOTRIN) 400 MG tablet Take 1 tablet (400 mg total) by mouth every 8 (eight) hours as needed for moderate pain. 09/15/16   Bonnielee Haff, MD  insulin aspart (NOVOLOG) 100 UNIT/ML injection Inject 0-15 Units into the skin 3 (three) times daily with meals. Sliding scale  CBG 70 - 120: 0 units: CBG 121 - 140: 2 units; CBG  140 - 200: 4 units; CBG 201 - 240: 6 units; CBG 240 - 300: 8 units;CBG 300 - 350: 12 units; CBG 351 - 400: 16 units; CBG > 400 : 16 units and notify your  MD 09/28/16   Arnoldo Morale, MD  Insulin Glargine (LANTUS SOLOSTAR) 100 UNIT/ML Solostar Pen Inject 55 Units into the skin daily at 10 pm. 09/28/16   Arnoldo Morale, MD  lactulose (CHRONULAC) 10 GM/15ML solution Take 15 mLs (10 g total) by mouth 2 (two) times daily as needed for mild constipation. 02/24/16   Arnoldo Morale, MD  lisinopril (PRINIVIL,ZESTRIL) 20 MG tablet Take 1 tablet  (20 mg total) by mouth daily. 09/28/16   Arnoldo Morale, MD    Family History Family History  Problem Relation Age of Onset  . Diabetes Mother   . Hypertension Mother   . Diabetes Father   . Heart attack Father   . Diabetes Sister     Social History Social History  Substance Use Topics  . Smoking status: Never Smoker  . Smokeless tobacco: Never Used  . Alcohol use No     Allergies   Patient has no known allergies.   Review of Systems Review of Systems  All other systems reviewed and are negative.    Physical Exam Updated Vital Signs BP (!) 160/103 (BP Location: Right Arm)   Pulse 98   Temp 98.7 F (37.1 C) (Oral)   Resp 20   Ht 5\' 7"  (1.702 m)   Wt 88.5 kg   SpO2 94%   BMI 30.54 kg/m   Physical Exam  Constitutional: He is oriented to person, place, and time. He appears well-developed and well-nourished.  Non-toxic appearance. No distress.  HENT:  Head: Normocephalic and atraumatic.  Eyes: Conjunctivae, EOM and lids are normal. Pupils are equal, round, and reactive to light.  Neck: Normal range of motion. Neck supple. No tracheal deviation present. No thyroid mass present.  Cardiovascular: Normal rate, regular rhythm and normal heart sounds.  Exam reveals no gallop.   No murmur heard. Pulmonary/Chest: Effort normal. No stridor. No respiratory distress. He has decreased breath sounds in the right lower field and the left lower field. He has no wheezes. He has no rhonchi. He has rales in the right lower field and the left lower field.  Abdominal: Soft. Normal appearance and bowel sounds are normal. He exhibits no distension. There is no tenderness. There is no rebound and no CVA tenderness.  Musculoskeletal: Normal range of motion. He exhibits no edema or tenderness.  Neurological: He is alert and oriented to person, place, and time. He has normal strength. No cranial nerve deficit or sensory deficit. GCS eye subscore is 4. GCS verbal subscore is 5. GCS motor  subscore is 6.  Skin: Skin is warm and dry. No abrasion and no rash noted.  Psychiatric: He has a normal mood and affect. His speech is normal and behavior is normal.  Nursing note and vitals reviewed.    ED Treatments / Results  Labs (all labs ordered are listed, but only abnormal results are displayed) Labs Reviewed  BRAIN NATRIURETIC PEPTIDE  TROPONIN I  CBC WITH DIFFERENTIAL/PLATELET  COMPREHENSIVE METABOLIC PANEL    EKG  EKG Interpretation None       Radiology Dg Chest 2 View  Result Date: 01/28/2017 CLINICAL DATA:  Patient with cough and chills.  Shortness of breath. EXAM: CHEST  2 VIEW COMPARISON:  Chest radiograph 11/15/2016 FINDINGS: Cardiomegaly. Tortuosity of the thoracic aorta. Pulmonary vascular redistribution  and interstitial opacities bilaterally. Thoracic spine degenerative changes. No pleural effusion or pneumothorax. IMPRESSION: Cardiomegaly and mild interstitial edema. Electronically Signed   By: Lovey Newcomer M.D.   On: 01/28/2017 20:30    Procedures Procedures (including critical care time)  Medications Ordered in ED Medications - No data to display   Initial Impression / Assessment and Plan / ED Course  I have reviewed the triage vital signs and the nursing notes.  Pertinent labs & imaging results that were available during my care of the patient were reviewed by me and considered in my medical decision making (see chart for details).     Patient given Lasix 40 mg IV for new onset CHF. Will be admitted to the hospitalist  Final Clinical Impressions(s) / ED Diagnoses   Final diagnoses:  None    New Prescriptions New Prescriptions   No medications on file     Lacretia Leigh, MD 01/28/17 2242

## 2017-01-29 ENCOUNTER — Inpatient Hospital Stay (HOSPITAL_COMMUNITY): Payer: Self-pay

## 2017-01-29 LAB — ECHOCARDIOGRAM COMPLETE
Height: 67 in
Weight: 3059.2 oz

## 2017-01-29 LAB — IRON AND TIBC
IRON: 33 ug/dL — AB (ref 45–182)
SATURATION RATIOS: 11 % — AB (ref 17.9–39.5)
TIBC: 308 ug/dL (ref 250–450)
UIBC: 275 ug/dL

## 2017-01-29 LAB — BASIC METABOLIC PANEL
Anion gap: 8 (ref 5–15)
BUN: 9 mg/dL (ref 6–20)
CALCIUM: 9 mg/dL (ref 8.9–10.3)
CO2: 25 mmol/L (ref 22–32)
CREATININE: 0.87 mg/dL (ref 0.61–1.24)
Chloride: 108 mmol/L (ref 101–111)
GLUCOSE: 124 mg/dL — AB (ref 65–99)
Potassium: 3 mmol/L — ABNORMAL LOW (ref 3.5–5.1)
Sodium: 141 mmol/L (ref 135–145)

## 2017-01-29 LAB — FERRITIN: FERRITIN: 28 ng/mL (ref 24–336)

## 2017-01-29 LAB — MAGNESIUM: Magnesium: 1.5 mg/dL — ABNORMAL LOW (ref 1.7–2.4)

## 2017-01-29 LAB — TROPONIN I: Troponin I: <0.03 ng/mL (ref <0.03)

## 2017-01-29 LAB — GLUCOSE, CAPILLARY
GLUCOSE-CAPILLARY: 104 mg/dL — AB (ref 65–99)
Glucose-Capillary: 112 mg/dL — ABNORMAL HIGH (ref 65–99)
Glucose-Capillary: 137 mg/dL — ABNORMAL HIGH (ref 65–99)
Glucose-Capillary: 158 mg/dL — ABNORMAL HIGH (ref 65–99)
Glucose-Capillary: 85 mg/dL (ref 65–99)

## 2017-01-29 LAB — TSH: TSH: 0.847 u[IU]/mL (ref 0.350–4.500)

## 2017-01-29 MED ORDER — INSULIN ASPART 100 UNIT/ML ~~LOC~~ SOLN
0.0000 [IU] | Freq: Three times a day (TID) | SUBCUTANEOUS | Status: DC
  Administered 2017-01-30: 3 [IU] via SUBCUTANEOUS
  Administered 2017-01-30: 5 [IU] via SUBCUTANEOUS
  Administered 2017-01-31 – 2017-02-01 (×3): 3 [IU] via SUBCUTANEOUS
  Administered 2017-02-01: 5 [IU] via SUBCUTANEOUS

## 2017-01-29 MED ORDER — LISINOPRIL 20 MG PO TABS
20.0000 mg | ORAL_TABLET | Freq: Every day | ORAL | Status: DC
  Administered 2017-01-29 – 2017-02-02 (×4): 20 mg via ORAL
  Filled 2017-01-29 (×4): qty 1

## 2017-01-29 MED ORDER — ACETAMINOPHEN 650 MG RE SUPP: 650.0000 mg | Freq: Four times a day (QID) | RECTAL | Status: DC | PRN

## 2017-01-29 MED ORDER — FERROUS SULFATE 325 (65 FE) MG PO TABS
325.0000 mg | ORAL_TABLET | Freq: Two times a day (BID) | ORAL | Status: DC
  Administered 2017-01-29 – 2017-02-02 (×8): 325 mg via ORAL
  Filled 2017-01-29 (×8): qty 1

## 2017-01-29 MED ORDER — POTASSIUM CHLORIDE CRYS ER 20 MEQ PO TBCR: 40.0000 meq | EXTENDED_RELEASE_TABLET | Freq: Every day | ORAL | Status: DC

## 2017-01-29 MED ORDER — SODIUM CHLORIDE 0.9% FLUSH
3.0000 mL | Freq: Two times a day (BID) | INTRAVENOUS | Status: DC
  Administered 2017-01-29 – 2017-02-02 (×9): 3 mL via INTRAVENOUS

## 2017-01-29 MED ORDER — GABAPENTIN 400 MG PO CAPS
400.0000 mg | ORAL_CAPSULE | Freq: Three times a day (TID) | ORAL | Status: DC
  Administered 2017-01-29 – 2017-02-02 (×11): 400 mg via ORAL
  Filled 2017-01-29 (×11): qty 1

## 2017-01-29 MED ORDER — INSULIN ASPART 100 UNIT/ML ~~LOC~~ SOLN
0.0000 [IU] | Freq: Every day | SUBCUTANEOUS | Status: DC
  Administered 2017-01-30: 2 [IU] via SUBCUTANEOUS

## 2017-01-29 MED ORDER — BENZONATATE 100 MG PO CAPS
100.0000 mg | ORAL_CAPSULE | Freq: Three times a day (TID) | ORAL | Status: DC | PRN
  Administered 2017-01-29: 100 mg via ORAL
  Filled 2017-01-29: qty 1

## 2017-01-29 MED ORDER — NITROGLYCERIN 2 % TD OINT
0.5000 [in_us] | TOPICAL_OINTMENT | Freq: Three times a day (TID) | TRANSDERMAL | Status: DC
  Administered 2017-01-29 – 2017-02-02 (×11): 0.5 [in_us] via TOPICAL
  Filled 2017-01-29 (×5): qty 30

## 2017-01-29 MED ORDER — FUROSEMIDE 10 MG/ML IJ SOLN
40.0000 mg | Freq: Two times a day (BID) | INTRAMUSCULAR | Status: DC
  Administered 2017-01-29 – 2017-01-31 (×5): 40 mg via INTRAVENOUS
  Filled 2017-01-29 (×6): qty 4

## 2017-01-29 MED ORDER — ENOXAPARIN SODIUM 40 MG/0.4ML ~~LOC~~ SOLN
40.0000 mg | Freq: Every day | SUBCUTANEOUS | Status: DC
  Administered 2017-01-29 – 2017-01-30 (×3): 40 mg via SUBCUTANEOUS
  Filled 2017-01-29 (×3): qty 0.4

## 2017-01-29 MED ORDER — MAGNESIUM SULFATE 2 GM/50ML IV SOLN
2.0000 g | Freq: Once | INTRAVENOUS | Status: AC
Start: 2017-01-29 — End: 2017-01-29
  Administered 2017-01-29: 2 g via INTRAVENOUS
  Filled 2017-01-29: qty 50

## 2017-01-29 MED ORDER — POTASSIUM CHLORIDE CRYS ER 20 MEQ PO TBCR
40.0000 meq | EXTENDED_RELEASE_TABLET | Freq: Two times a day (BID) | ORAL | Status: AC
  Administered 2017-01-29 (×2): 40 meq via ORAL
  Filled 2017-01-29 (×2): qty 2

## 2017-01-29 MED ORDER — ACETAMINOPHEN 325 MG PO TABS: 650.0000 mg | ORAL_TABLET | Freq: Four times a day (QID) | ORAL | Status: DC | PRN

## 2017-01-29 MED ORDER — INFLUENZA VAC SPLIT QUAD 0.5 ML IM SUSY: 0.5000 mL | PREFILLED_SYRINGE | INTRAMUSCULAR | Status: DC

## 2017-01-29 MED ORDER — HYDRALAZINE HCL 20 MG/ML IJ SOLN
10.0000 mg | Freq: Four times a day (QID) | INTRAMUSCULAR | Status: DC | PRN
  Administered 2017-01-29: 10 mg via INTRAVENOUS
  Filled 2017-01-29: qty 1

## 2017-01-29 MED ORDER — INSULIN GLARGINE 100 UNIT/ML ~~LOC~~ SOLN
55.0000 [IU] | Freq: Every day | SUBCUTANEOUS | Status: DC
  Administered 2017-01-29 – 2017-02-01 (×5): 55 [IU] via SUBCUTANEOUS
  Filled 2017-01-29 (×5): qty 0.55

## 2017-01-29 NOTE — Progress Notes (Signed)
PROGRESS NOTE    Jeffrey [name]  JYN:[id number] DOB: 01/18/1966 DOA: 01/28/2017 PCP: Arnoldo Morale, MD    Brief Narrative:  51 y.o. male with a past medical history significant for IDDM c/b neuropathy and toe amputations, and HTN who presents with 1 week dyspnea on exertion and cough.  He was in his usual state of health until about a week or so ago when he developed cough and chest congestion. He thought it was a upper respiratory infection, so he tried drinking more water, and taking OTC cough meds, but none of these helped at all. His symptoms slowly progressed over the last week, until he had some noticeable dyspnea on exertion, feeling of smothering when he lays flat, relieved with sitting up, and worse cough, so finally he came into the ER. He's had no chest pain, no fever, no chills, no nasal congestion, no rhinorrhea.  Assessment & Plan:   Principal Problem:   Acute CHF (congestive heart failure) (HCC) Active Problems:   Essential hypertension   Uncontrolled type 2 diabetes mellitus with hyperglycemia, with long-term current use of insulin (HCC)   Anemia, iron deficiency  1. New onset acute systolic CHF:  - Recent URI symptoms noted. Question viral cardiomyopathy, although cardiomegaly was noted on a 05/09/16 CXR - 2d echo with EF 20-25% with no wall motion abnormality -Continued on Furosemide 40 mg IV twice a day -TSH within normal limits -Strict I's and O's -Continue daily BMP -Continue ACEi -Stable at present. Consider cardiology consultation in AM  2. Insulin-dependent diabetes:  - Hyperglycemic at admission. -Continue lantus 55 nightly -SSI with meals  3. Essential hypertension:  - Hypertensive at admission. -Continue lisinopril -Continue lasix per above  4. Microcytic anemia, presumed iron deficiency:  -Iron borderline low at 33 -recommend outpatient colonoscopy   5. Hypokalemia - Will replace  6. Hypomagnesemia  - Will replace  DVT prophylaxis:  Lovenox Code Status: Full Family Communication: Pt in room, family not at bedside Disposition Plan: Uncertain at this time  Consultants:     Procedures:   2d echo 2/18 with EF 20-25%  Antimicrobials: Anti-infectives    None       Subjective: Reports feeling better today  Objective: Vitals:   01/29/17 0046 01/29/17 0300 01/29/17 0542 01/29/17 1355  BP: (!) 157/106 (!) 157/103 (!) 152/99 128/70  Pulse: 87 88 87 85  Resp:   20 20  Temp:   98.7 F (37.1 C) 99.5 F (37.5 C)  TempSrc:   Oral Oral  SpO2:  97% 100% 96%  Weight:   86.7 kg (191 lb 3.2 oz)   Height:        Intake/Output Summary (Last 24 hours) at 01/29/17 1612 Last data filed at 01/29/17 1230  Gross per 24 hour  Intake              600 ml  Output             3775 ml  Net            -3175 ml   Filed Weights   01/28/17 1954 01/29/17 0030 01/29/17 0542  Weight: 88.5 kg (195 lb) 87.1 kg (192 lb) 86.7 kg (191 lb 3.2 oz)    Examination:  General exam: Appears calm and comfortable  Respiratory system: Clear to auscultation. Respiratory effort normal. Cardiovascular system: S1 & S2 heard, RRR Gastrointestinal system: Abdomen is nondistended, soft and nontender. No organomegaly or masses felt. Normal bowel sounds heard. Central nervous system: Alert and oriented.  No focal neurological deficits. Extremities: Symmetric 5 x 5 power. Skin: No rashes, lesions Psychiatry: Judgement and insight appear normal. Mood & affect appropriate.   Data Reviewed: I have personally reviewed following labs and imaging studies  CBC:  Recent Labs Lab 01/28/17 2127  WBC 4.7  NEUTROABS 3.1  HGB 11.5*  HCT 36.4*  MCV 74.0*  PLT 542   Basic Metabolic Panel:  Recent Labs Lab 01/28/17 2127 01/29/17 0530  NA 140 141  K 3.5 3.0*  CL 111 108  CO2 23 25  GLUCOSE 283* 124*  BUN 10 9  CREATININE 0.82 0.87  CALCIUM 9.0 9.0  MG  --  1.5*   GFR: Estimated Creatinine Clearance: 105.6 mL/min (by C-G formula based on  SCr of 0.87 mg/dL). Liver Function Tests:  Recent Labs Lab 01/28/17 2127  AST 16  ALT 14*  ALKPHOS 74  BILITOT 0.5  PROT 7.3  ALBUMIN 3.2*   No results for input(s): LIPASE, AMYLASE in the last 168 hours. No results for input(s): AMMONIA in the last 168 hours. Coagulation Profile: No results for input(s): INR, PROTIME in the last 168 hours. Cardiac Enzymes:  Recent Labs Lab 01/28/17 2127 01/29/17 0530  TROPONINI <0.03 <0.03   BNP (last 3 results) No results for input(s): PROBNP in the last 8760 hours. HbA1C: No results for input(s): HGBA1C in the last 72 hours. CBG:  Recent Labs Lab 01/29/17 0045 01/29/17 0741 01/29/17 1227  GLUCAP 158* 85 104*   Lipid Profile: No results for input(s): CHOL, HDL, LDLCALC, TRIG, CHOLHDL, LDLDIRECT in the last 72 hours. Thyroid Function Tests:  Recent Labs  01/29/17 0530  TSH 0.847   Anemia Panel:  Recent Labs  01/29/17 0530  FERRITIN 28  TIBC 308  IRON 33*   Sepsis Labs: No results for input(s): PROCALCITON, LATICACIDVEN in the last 168 hours.  No results found for this or any previous visit (from the past 240 hour(s)).   Radiology Studies: Dg Chest 2 View  Result Date: 01/28/2017 CLINICAL DATA:  Patient with cough and chills.  Shortness of breath. EXAM: CHEST  2 VIEW COMPARISON:  Chest radiograph 11/15/2016 FINDINGS: Cardiomegaly. Tortuosity of the thoracic aorta. Pulmonary vascular redistribution and interstitial opacities bilaterally. Thoracic spine degenerative changes. No pleural effusion or pneumothorax. IMPRESSION: Cardiomegaly and mild interstitial edema. Electronically Signed   By: Lovey Newcomer M.D.   On: 01/28/2017 20:30    Scheduled Meds: . enoxaparin (LOVENOX) injection  40 mg Subcutaneous QHS  . ferrous sulfate  325 mg Oral BID WC  . furosemide  40 mg Intravenous BID  . gabapentin  400 mg Oral TID  . [START ON 01/30/2017] Influenza vac split quadrivalent PF  0.5 mL Intramuscular Tomorrow-1000  .  insulin aspart  0-15 Units Subcutaneous TID WC  . insulin aspart  0-5 Units Subcutaneous QHS  . insulin glargine  55 Units Subcutaneous Q2200  . lisinopril  20 mg Oral Daily  . nitroGLYCERIN  0.5 inch Topical Q8H  . potassium chloride  40 mEq Oral BID  . sodium chloride flush  3 mL Intravenous Q12H   Continuous Infusions:   LOS: 1 day   CHIU, Orpah Melter, MD [facility] Pager [phone]  If 7PM-7AM, please contact night-coverage [email or web address] Password [facility] 01/29/2017, 4:12 PM

## 2017-01-29 NOTE — Progress Notes (Signed)
  Echocardiogram 2D Echocardiogram has been performed.  Darlina Sicilian M 01/29/2017, 11:19 AM

## 2017-01-29 NOTE — Progress Notes (Addendum)
0259 CTM reports pt HR dropped to 40's and pt was in 2degree HB for 6 seconds and then reverted back to SR 80's.  Pt asymptomatic.  Strip saved.  BP remains elevated at 157/103, HR 88, Sat 95%,  Dr. Maudie Mercury on call and notified.  EKG performed and Hydralazine given.  Will continue to monitor

## 2017-01-30 DIAGNOSIS — I5021 Acute systolic (congestive) heart failure: Secondary | ICD-10-CM

## 2017-01-30 DIAGNOSIS — I5023 Acute on chronic systolic (congestive) heart failure: Secondary | ICD-10-CM

## 2017-01-30 LAB — GLUCOSE, CAPILLARY
GLUCOSE-CAPILLARY: 91 mg/dL (ref 65–99)
GLUCOSE-CAPILLARY: 222 mg/dL — AB (ref 65–99)
GLUCOSE-CAPILLARY: 202 mg/dL — AB (ref 65–99)
Glucose-Capillary: 155 mg/dL — ABNORMAL HIGH (ref 65–99)

## 2017-01-30 LAB — MAGNESIUM: MAGNESIUM: 1.8 mg/dL (ref 1.7–2.4)

## 2017-01-30 LAB — BASIC METABOLIC PANEL
Anion gap: 6 (ref 5–15)
BUN: 14 mg/dL (ref 6–20)
CALCIUM: 8.8 mg/dL — AB (ref 8.9–10.3)
CO2: 24 mmol/L (ref 22–32)
CREATININE: 1.01 mg/dL (ref 0.61–1.24)
Chloride: 107 mmol/L (ref 101–111)
GFR calc non Af Amer: >60 mL/min (ref >60)
Glucose, Bld: 95 mg/dL (ref 65–99)
Potassium: 3.1 mmol/L — ABNORMAL LOW (ref 3.5–5.1)
SODIUM: 137 mmol/L (ref 135–145)

## 2017-01-30 LAB — PROTIME-INR
INR: 0.94
PROTHROMBIN TIME: 12.6 s (ref 11.4–15.2)

## 2017-01-30 MED ORDER — SODIUM CHLORIDE 0.9 % IV SOLN: Freq: Once | INTRAVENOUS | Status: DC

## 2017-01-30 MED ORDER — MAGNESIUM SULFATE 2 GM/50ML IV SOLN
2.0000 g | Freq: Once | INTRAVENOUS | Status: AC
Start: 2017-01-30 — End: 2017-01-30
  Administered 2017-01-30: 2 g via INTRAVENOUS
  Filled 2017-01-30: qty 50

## 2017-01-30 MED ORDER — CARVEDILOL 3.125 MG PO TABS
3.1250 mg | ORAL_TABLET | Freq: Two times a day (BID) | ORAL | Status: DC
  Administered 2017-01-30 – 2017-01-31 (×3): 3.125 mg via ORAL
  Filled 2017-01-30 (×4): qty 1

## 2017-01-30 MED ORDER — SODIUM CHLORIDE 0.9% FLUSH
3.0000 mL | Freq: Two times a day (BID) | INTRAVENOUS | Status: DC
  Administered 2017-01-30: 3 mL via INTRAVENOUS

## 2017-01-30 MED ORDER — POTASSIUM CHLORIDE CRYS ER 20 MEQ PO TBCR
60.0000 meq | EXTENDED_RELEASE_TABLET | Freq: Two times a day (BID) | ORAL | Status: AC
  Administered 2017-01-30 (×2): 60 meq via ORAL
  Filled 2017-01-30 (×2): qty 3

## 2017-01-30 MED ORDER — SODIUM CHLORIDE 0.9% FLUSH: 3.0000 mL | INTRAVENOUS | Status: DC | PRN

## 2017-01-30 MED ORDER — SODIUM CHLORIDE 0.9 % IV SOLN: 250.0000 mL | INTRAVENOUS | Status: DC | PRN

## 2017-01-30 MED ORDER — ASPIRIN 81 MG PO CHEW
81.0000 mg | CHEWABLE_TABLET | ORAL | Status: AC
  Administered 2017-01-31: 81 mg via ORAL
  Filled 2017-01-30: qty 1

## 2017-01-30 NOTE — Care Management Note (Addendum)
Case Management Note  Patient Details  Name: Jeffrey [name] MRN: [id number] Date of Birth: 05/20/1966  Subjective/Objective: 51 y/o m admitted w/CHF. From home. pcp-[facility]-Dr. Jarold Song.Noted EF 25% noted cardio cons. Will ask Advanced Heart failure clinic to screen. [facility] will f/u w/resources.                 Action/Plan:d/c plan home.   Expected Discharge Date:  02/01/17               Expected Discharge Plan:  Home/Self Care  In-House Referral:     Discharge planning Services  CM Consult  Post Acute Care Choice:    Choice offered to:     DME Arranged:    DME Agency:     HH Arranged:    HH Agency:     Status of Service:  In process, will continue to follow  If discussed at Long Length of Stay Meetings, dates discussed:    Additional Comments:  Dessa Phi, RN 01/30/2017, 11:27 AM

## 2017-01-30 NOTE — Progress Notes (Signed)
PROGRESS NOTE    Jeffrey [name]  UXN:[id number] DOB: 12-23-1965 DOA: 01/28/2017 PCP: Arnoldo Morale, MD    Brief Narrative:  51 y.o. male with a past medical history significant for IDDM c/b neuropathy and toe amputations, and HTN who presents with 1 week dyspnea on exertion and cough.  He was in his usual state of health until about a week or so ago when he developed cough and chest congestion. He thought it was a upper respiratory infection, so he tried drinking more water, and taking OTC cough meds, but none of these helped at all. His symptoms slowly progressed over the last week, until he had some noticeable dyspnea on exertion, feeling of smothering when he lays flat, relieved with sitting up, and worse cough, so finally he came into the ER. He's had no chest pain, no fever, no chills, no nasal congestion, no rhinorrhea.  Assessment & Plan:   Principal Problem:   Acute CHF (congestive heart failure) (HCC) Active Problems:   Essential hypertension   Uncontrolled type 2 diabetes mellitus with hyperglycemia, with long-term current use of insulin (HCC)   Anemia, iron deficiency  1. New onset acute systolic CHF:  - 2d echo with EF 20-25% with no wall motion abnormality - Continued on Furosemide 40 mg IV twice a day -TSH within normal limits -Strict I's and O's -Continue daily BMP -Continue ACEi - Cardiology consulted. Plans for L and R heart cath tomorrow. Plan to start coreg with continued lisinopril  2. Insulin-dependent diabetes:  - Hyperglycemic at admission. -Continue lantus 55 nightly -SSI with meals - glucose trends reviewed. Stable thus far  3. Essential hypertension:  - Hypertensive at admission. -Continue lisinopril -Continue lasix per above - coreg added per Cardiology  4. Microcytic anemia, presumed iron deficiency:  -Iron borderline low at 33 -recommend outpatient colonoscopy when more stable  5. Hypokalemia - Replace - Repeat bmet in AM  6.  Hypomagnesemia  - will correct - Repeat level in AM  DVT prophylaxis: Lovenox Code Status: Full Family Communication: Pt in room, family not at bedside Disposition Plan: Uncertain at this time  Consultants:   Cardiology  Procedures:   2d echo 2/18 with EF 20-25%  Antimicrobials: Anti-infectives    None      Subjective: No complaints at present  Objective: Vitals:   01/29/17 1355 01/29/17 2123 01/30/17 0538 01/30/17 1300  BP: 128/70 131/76 119/66 129/72  Pulse: 85 89 86 87  Resp: 20 20 20 18   Temp: 99.5 F (37.5 C) 99.8 F (37.7 C) 98.9 F (37.2 C) 98.1 F (36.7 C)  TempSrc: Oral Oral Oral Oral  SpO2: 96% 98% 97% 96%  Weight:   86.7 kg (191 lb 3.2 oz)   Height:        Intake/Output Summary (Last 24 hours) at 01/30/17 1612 Last data filed at 01/30/17 1500  Gross per 24 hour  Intake              480 ml  Output             2025 ml  Net            -1545 ml   Filed Weights   01/29/17 0030 01/29/17 0542 01/30/17 0538  Weight: 87.1 kg (192 lb) 86.7 kg (191 lb 3.2 oz) 86.7 kg (191 lb 3.2 oz)    Examination:  General exam: Laying in bed, awake, in nad Respiratory system: normal chest rise, no audible wheezing Cardiovascular system: regular rate, s1-2 Gastrointestinal system:  soft, nondistended, pos BS Central nervous system: cn2-12 grossly intact, strength intact Extremities: perfused, no clubbing Skin: normal skin turgor, no notable skin lesions seen Psychiatry: affect normal// no visual hallucinations.   Data Reviewed: I have personally reviewed following labs and imaging studies  CBC:  Recent Labs Lab 01/28/17 2127  WBC 4.7  NEUTROABS 3.1  HGB 11.5*  HCT 36.4*  MCV 74.0*  PLT 735   Basic Metabolic Panel:  Recent Labs Lab 01/28/17 2127 01/29/17 0530 01/30/17 0524  NA 140 141 137  K 3.5 3.0* 3.1*  CL 111 108 107  CO2 23 25 24   GLUCOSE 283* 124* 95  BUN 10 9 14   CREATININE 0.82 0.87 1.01  CALCIUM 9.0 9.0 8.8*  MG  --  1.5* 1.8    GFR: Estimated Creatinine Clearance: 90.9 mL/min (by C-G formula based on SCr of 1.01 mg/dL). Liver Function Tests:  Recent Labs Lab 01/28/17 2127  AST 16  ALT 14*  ALKPHOS 74  BILITOT 0.5  PROT 7.3  ALBUMIN 3.2*   No results for input(s): LIPASE, AMYLASE in the last 168 hours. No results for input(s): AMMONIA in the last 168 hours. Coagulation Profile: No results for input(s): INR, PROTIME in the last 168 hours. Cardiac Enzymes:  Recent Labs Lab 01/28/17 2127 01/29/17 0530  TROPONINI <0.03 <0.03   BNP (last 3 results) No results for input(s): PROBNP in the last 8760 hours. HbA1C: No results for input(s): HGBA1C in the last 72 hours. CBG:  Recent Labs Lab 01/29/17 1227 01/29/17 1656 01/29/17 2122 01/30/17 0839 01/30/17 1245  GLUCAP 104* 112* 137* 91 155*   Lipid Profile: No results for input(s): CHOL, HDL, LDLCALC, TRIG, CHOLHDL, LDLDIRECT in the last 72 hours. Thyroid Function Tests:  Recent Labs  01/29/17 0530  TSH 0.847   Anemia Panel:  Recent Labs  01/29/17 0530  FERRITIN 28  TIBC 308  IRON 33*   Sepsis Labs: No results for input(s): PROCALCITON, LATICACIDVEN in the last 168 hours.  No results found for this or any previous visit (from the past 240 hour(s)).   Radiology Studies: Dg Chest 2 View  Result Date: 01/28/2017 CLINICAL DATA:  Patient with cough and chills.  Shortness of breath. EXAM: CHEST  2 VIEW COMPARISON:  Chest radiograph 11/15/2016 FINDINGS: Cardiomegaly. Tortuosity of the thoracic aorta. Pulmonary vascular redistribution and interstitial opacities bilaterally. Thoracic spine degenerative changes. No pleural effusion or pneumothorax. IMPRESSION: Cardiomegaly and mild interstitial edema. Electronically Signed   By: Lovey Newcomer M.D.   On: 01/28/2017 20:30    Scheduled Meds: . carvedilol  3.125 mg Oral BID WC  . enoxaparin (LOVENOX) injection  40 mg Subcutaneous QHS  . ferrous sulfate  325 mg Oral BID WC  . furosemide  40  mg Intravenous BID  . gabapentin  400 mg Oral TID  . Influenza vac split quadrivalent PF  0.5 mL Intramuscular Tomorrow-1000  . insulin aspart  0-15 Units Subcutaneous TID WC  . insulin aspart  0-5 Units Subcutaneous QHS  . insulin glargine  55 Units Subcutaneous Q2200  . lisinopril  20 mg Oral Daily  . nitroGLYCERIN  0.5 inch Topical Q8H  . potassium chloride  60 mEq Oral BID  . sodium chloride flush  3 mL Intravenous Q12H   Continuous Infusions:   LOS: 2 days   CHIU, Orpah Melter, MD [facility] Pager [phone]  If 7PM-7AM, please contact night-coverage [email or web address] Password [email or web address] 01/30/2017, 4:12 PM

## 2017-01-30 NOTE — Consult Note (Signed)
Cardiology Consult    Patient ID: Jeffrey [name] MRN: [id number], DOB/AGE: 52-Jun-1967   Admit date: 01/28/2017 Date of Consult: 01/30/2017  Primary Physician: Arnoldo Morale, MD Primary Cardiologist: New Requesting Provider: Dr. Wyline Copas Reason for Consultation: New HF  Patient Profile    51 yo male with PMH of IDDM, HTN and toe amputations who presented with progressive ongoing dyspnea.   Past Medical History   Past Medical History:  Diagnosis Date  . Diabetes mellitus   . Diabetic foot infection ([place]) 03/2016   RT FOOT  . HTN (hypertension)     Past Surgical History:  Procedure Laterality Date  . AMPUTATION Right 04/01/2016   Procedure: Right Great Toe Amputation;  Surgeon: Newt Minion, MD;  Location: [facility];  Service: Orthopedics;  Laterality: Right;  . AMPUTATION Right 06/19/2016   Procedure: AMPUTATION SECOND TOE;  Surgeon: Marybelle Killings, MD;  Location: [facility];  Service: Orthopedics;  Laterality: Right;  . BACK SURGERY     for abscess     Allergies  No Known Allergies  History of Present Illness    Jeffrey [name] is a 51 yo male with PMH of IDDM, HTN and toe amputations. Reports he [facility]. States he was dx with HTN about a year ago, but has been diabetic for about 10 years in insulin. Had toe amputations about a year ago by Dr. Sharol Given after developing infection. Denies any personal or family hx of CAD. Has never had ischemic work up in the past.   Reports he is fairly active, walks to the mailbox and around the block at his home. Does not normal experience any anginal symptoms. About a week ago developed productive cough and congestion. Treated with OTC medications, with little improvement. Symptoms slowly progressed and dyspnea worsened. Developed orthopnea, and LE edema.   Presented to the [facility] ED on [facility]/28/17. Labs showed stable electrolytes, BNP 206, Hgb 11.5. CXR with cardiomegaly and edema. EKG showed SR with LVH, and  nonspecific T wave changes in the anteroseptal leads. Trop neg x2. He was admitted and started on IV lasix. TSH normal. Echo this admission demonstrated severely reduced EF of 20-25%, but no WMA.   Inpatient Medications    . enoxaparin (LOVENOX) injection  40 mg Subcutaneous QHS  . ferrous sulfate  325 mg Oral BID WC  . furosemide  40 mg Intravenous BID  . gabapentin  400 mg Oral TID  . Influenza vac split quadrivalent PF  0.5 mL Intramuscular Tomorrow-1000  . insulin aspart  0-15 Units Subcutaneous TID WC  . insulin aspart  0-5 Units Subcutaneous QHS  . insulin glargine  55 Units Subcutaneous Q2200  . lisinopril  20 mg Oral Daily  . nitroGLYCERIN  0.5 inch Topical Q8H  . potassium chloride  60 mEq Oral BID  . sodium chloride flush  3 mL Intravenous Q12H    Family History    Family History  Problem Relation Age of Onset  . Diabetes Mother   . Hypertension Mother   . Diabetes Father   . Heart attack Father   . Diabetes Sister   . Heart attack Maternal Grandmother     Social History    Social History   Social History  . Marital status: Significant Other    Spouse name: N/A  . Number of children: N/A  . Years of education: N/A   Occupational History  . Not on file.   Social History  Main Topics  . Smoking status: Never Smoker  . Smokeless tobacco: Never Used  . Alcohol use No  . Drug use: No  . Sexual activity: Not on file   Other Topics Concern  . Not on file   Social History Narrative  . No narrative on file     Review of Systems    General:  No chills, fever, night sweats or weight changes.  Cardiovascular:  No chest pain, dyspnea on exertion, ++ edema, ++ orthopnea, palpitations, paroxysmal nocturnal dyspnea. Dermatological: No rash, lesions/masses Respiratory: No cough, ++ dyspnea Urologic: No hematuria, dysuria Abdominal:   No nausea, vomiting, diarrhea, bright red blood per rectum, melena, or hematemesis Neurologic:  No visual changes, wkns, changes  in mental status. All other systems reviewed and are otherwise negative except as noted above.  Physical Exam    Blood pressure 119/66, pulse 86, temperature 98.9 F (37.2 C), temperature source Oral, resp. rate 20, height 5\' 7"  (1.702 m), weight 191 lb 3.2 oz (86.7 kg), SpO2 97 %.  General: Pleasant younger AAM, NAD Psych: Normal affect. Neuro: Alert and oriented X 3. Moves all extremities spontaneously. HEENT: Normal  Neck: Supple without bruits or JVD. Lungs:  Resp regular and unlabored, CTA. Heart: RRR no s3, s4, or murmurs. Abdomen: Soft, non-tender, non-distended, BS + x 4.  Extremities: No clubbing, cyanosis or edema. DP/PT/Radials 2+ and equal bilaterally. Toe amputation to right foot.   Labs    Troponin (Point of Care Test) No results for input(s): TROPIPOC in the last 72 hours.  Recent Labs  01/28/17 2127 01/29/17 0530  TROPONINI <0.03 <0.03   Lab Results  Component Value Date   WBC 4.7 01/28/2017   HGB 11.5 (L) 01/28/2017   HCT 36.4 (L) 01/28/2017   MCV 74.0 (L) 01/28/2017   PLT 292 01/28/2017    Recent Labs Lab 01/28/17 2127  01/30/17 0524  NA 140  < > 137  K 3.5  < > 3.1*  CL 111  < > 107  CO2 23  < > 24  BUN 10  < > 14  CREATININE 0.82  < > 1.01  CALCIUM 9.0  < > 8.8*  PROT 7.3  --   --   BILITOT 0.5  --   --   ALKPHOS 74  --   --   ALT 14*  --   --   AST 16  --   --   GLUCOSE 283*  < > 95  < > = values in this interval not displayed. Lab Results  Component Value Date   CHOL 191 08/21/2013   HDL 42 08/21/2013   LDLCALC 93 08/21/2013   TRIG 281 (H) 08/21/2013   Lab Results  Component Value Date   DDIMER <0.27 12/05/2014     Radiology Studies    Dg Chest 2 View  Result Date: 01/28/2017 CLINICAL DATA:  Patient with cough and chills.  Shortness of breath. EXAM: CHEST  2 VIEW COMPARISON:  Chest radiograph 11/15/2016 FINDINGS: Cardiomegaly. Tortuosity of the thoracic aorta. Pulmonary vascular redistribution and interstitial opacities  bilaterally. Thoracic spine degenerative changes. No pleural effusion or pneumothorax. IMPRESSION: Cardiomegaly and mild interstitial edema. Electronically Signed   By: Lovey Newcomer M.D.   On: 01/28/2017 20:30    ECG & Cardiac Imaging    EKG: SR with LVH, and nonspecific T wave changes in the anteroseptal leads  Echo: 01/29/17  Study Conclusions  - Left ventricle: The cavity size was mildly dilated. Systolic  function was severely reduced. The estimated ejection fraction   was in the range of 20% to 25%. Wall motion was normal; there   were no regional wall motion abnormalities. - Left atrium: The atrium was mildly dilated.  Assessment & Plan    51 yo male with PMH of IDDM, HTN and toe amputations who presented with progressive ongoing dyspnea.   1. Acute systolic HF: Presented with progressive dyspnea and orthopnea. BNP mildly elevated, and CXR with edema and cardiomegaly. Echo this admission showed severely reduced EF of 20-25% no WMA. Denies any anginal symptoms prior to admission.  -- on IV lasix 40mg  BID with good UOP thus far, reports his breathing has improved since admission.  -- Question whether this is ICM vs NICM. For now would add on low dose coreg 3.125mg  BID. Currently on ACEi, but may be a Entresto candidate though are financial concerns present.  -- continue ACEi  -- needs ischemic evaluation, make NPO and plan for R/L cardiac catheterization tomorrow.  -- consider the addition of spironolactone as blood pressure allows  -- check lipid for risk stratification   2. HTN: noted to be hypertensive on admission, but states generally controlled at home. Recommendations as above.   3. IDDM: CBGs uncontrolled in the out/inpatient setting. Hgb A1c pending, last 10/17 12.4   Barnet Pall, NP-C Pager [phone] 01/30/2017, 12:37 PM  Attending Note:   The patient was seen and examined.  Agree with assessment and plan as noted above.  Changes made to the above  note as needed.  Patient seen and independently examined with Jeffrey Bellis, NP.   We discussed all aspects of the encounter. I agree with the assessment and plan as stated above.  1. Acute on chronic systolic congestive heart failure: Patient presents with several weeks of increasing shortness of breath. His echocardiogram reveals a severely depressed left ventricle systolic function with an EF of 20-25%. He's been treated with IV Lasix and has diuresed nicely. He is breathing better. He has had a long history of hypertension but has not been taking medications.  I would like to proceed with a cardiac cath tomorrow for further evaluation.   He has a history of diabetes mellitus and might be having silent ischemia.   We'll do a right and left heart catheterization.   We will start him on carvedilol 3.125 mg twice a day. He's currently on lisinopril. We discussed possibly starting him on chest oh but I'm not sure that he'll be able to afford that. I would rather stick with lisinopril for now. We will also consider adding spironolactone in the near future.  We have discussed the risks, benefits, and options regarding heart catheterization. He understands and agrees to proceed.   I have spent a total of 40 minutes with patient reviewing hospital  notes , telemetry, EKGs, labs and examining patient as well as establishing an assessment and plan that was discussed with the patient. > 50% of time was spent in direct patient care.    Thayer Headings, Brooke Bonito., MD, [facility] 01/30/2017, 2:22 PM [address],  [place] Pager [phone]

## 2017-01-30 NOTE — Hospital Discharge Follow-Up (Signed)
Transitional Care Clinic Care Coordination Note:  Admit date:  01/28/17 Discharge date: TBD Discharge Disposition: home Patient contact: # [phone] Emergency contact(s): Claris Pong ( friend)  # [phone]  This Case Manager reviewed patient's EMR and determined patient would benefit from post-discharge medical management and chronic care management services through the [facility]. 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 He was admitted with ongoing dyspnea and has been diagnosed with CHF, EF 20-25%. He has 5 hospital admissions and 3 ED visits in the past year.  This Case Manager met with patient and [name] to discuss the services and medical management that can be provided at the [facility]. Patient verbalized understanding and agreed to receive post-discharge care at the [facility].   Patient scheduled for Transitional Care appointment on 02/08/17 @ 0945.  Clinic information and appointment time provided to patient. Appointment information also placed on AVS.  Assessment:       Home Environment: lives with sister        Support System: sister and friend, [name]. [name] was at the hospital when this CM was there seeing the patient and he wanted this CM to meet [facility] and explain the services. He stated that [facility] manages his medications and she confirmed that statement.        Level of functioning:  [name] assists with medication management. 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 This is just a temporary position.        Home DME: None       Home care services: (services arranged prior to discharge or new services after discharge) He had [facility] RN in the past for wound care but states that he no longer needs the services. He also noted that [name] had been educated regarding how to do the wound care.        Transportation: He  said that he drives but his car is currently in the shop. He may need transportation to his clinic appointment. Explained to him that a cab can be arranged if needed.        Food/Nutrition: (ability to afford, access, use of any community resources): He denied any problems getting food/necessary nutrition.  He stated that he receives $191/month in food stamps.         Medications: (ability to afford, access, compliance, Pharmacy used) He denied any problems obtaining his medications. He uses [facility].         Identified Barriers: lack of insurance, has not applied for the [employer], ? Compliance with medication regime, no glucometer. Transportation not reliable at this time.         PCP (Name, office location, phone number): Dr Jarold Song at [facility].  Patient Education:Explained the services provided at [facility] including social work and financial assistance. He is already aware of the Pharmacy services.              Arranged services:        Services communicated to :  Message left for Dessa Phi, RN CM

## 2017-01-31 ENCOUNTER — Encounter (HOSPITAL_COMMUNITY)
Admission: EM | Disposition: A | Discharge status: Home / Self Care | Payer: Self-pay | Source: Home / Self Care | Attending: Internal Medicine

## 2017-01-31 ENCOUNTER — Telehealth: Payer: Self-pay

## 2017-01-31 DIAGNOSIS — I5022 Chronic systolic (congestive) heart failure: Secondary | ICD-10-CM

## 2017-01-31 DIAGNOSIS — I2510 Atherosclerotic heart disease of native coronary artery without angina pectoris: Secondary | ICD-10-CM

## 2017-01-31 DIAGNOSIS — I5041 Acute combined systolic (congestive) and diastolic (congestive) heart failure: Secondary | ICD-10-CM

## 2017-01-31 HISTORY — PX: RIGHT/LEFT HEART CATH AND CORONARY ANGIOGRAPHY: CATH118266

## 2017-01-31 LAB — GLUCOSE, CAPILLARY
GLUCOSE-CAPILLARY: 59 mg/dL — AB (ref 65–99)
GLUCOSE-CAPILLARY: 192 mg/dL — AB (ref 65–99)
GLUCOSE-CAPILLARY: 174 mg/dL — AB (ref 65–99)
GLUCOSE-CAPILLARY: 175 mg/dL — AB (ref 65–99)
Glucose-Capillary: 152 mg/dL — ABNORMAL HIGH (ref 65–99)

## 2017-01-31 LAB — POCT I-STAT 3, ART BLOOD GAS (G3+)
Acid-base deficit: 3 mmol/L — ABNORMAL HIGH (ref 0.0–2.0)
Bicarbonate: 22.4 mmol/L (ref 20.0–28.0)
O2 SAT: 94 %
PH ART: 7.372 (ref 7.350–7.450)
TCO2: 24 mmol/L (ref 0–100)
pCO2 arterial: 38.6 mmHg (ref 32.0–48.0)
pO2, Arterial: 71 mmHg — ABNORMAL LOW (ref 83.0–108.0)

## 2017-01-31 LAB — POCT I-STAT 3, VENOUS BLOOD GAS (G3P V)
ACID-BASE DEFICIT: 1 mmol/L (ref 0.0–2.0)
Bicarbonate: 25.3 mmol/L (ref 20.0–28.0)
O2 SAT: 65 %
PH VEN: 7.354 (ref 7.250–7.430)
PO2 VEN: 36 mmHg (ref 32.0–45.0)
TCO2: 27 mmol/L (ref 0–100)
pCO2, Ven: 45.4 mmHg (ref 44.0–60.0)

## 2017-01-31 LAB — BASIC METABOLIC PANEL
Anion gap: 5 (ref 5–15)
BUN: 17 mg/dL (ref 6–20)
CALCIUM: 9.1 mg/dL (ref 8.9–10.3)
CO2: 28 mmol/L (ref 22–32)
Chloride: 106 mmol/L (ref 101–111)
Creatinine, Ser: 1.09 mg/dL (ref 0.61–1.24)
GFR calc Af Amer: >60 mL/min (ref >60)
Glucose, Bld: 149 mg/dL — ABNORMAL HIGH (ref 65–99)
Potassium: 3.9 mmol/L (ref 3.5–5.1)
Sodium: 139 mmol/L (ref 135–145)

## 2017-01-31 LAB — CBC
HEMATOCRIT: 42.9 % (ref 39.0–52.0)
HEMOGLOBIN: 13.6 g/dL (ref 13.0–17.0)
MCH: 23.7 pg — AB (ref 26.0–34.0)
MCHC: 31.7 g/dL (ref 30.0–36.0)
MCV: 74.6 fL — ABNORMAL LOW (ref 78.0–100.0)
Platelets: 320 10*3/uL (ref 150–400)
RBC: 5.75 MIL/uL (ref 4.22–5.81)
RDW: 16 % — ABNORMAL HIGH (ref 11.5–15.5)
WBC: 5.3 10*3/uL (ref 4.0–10.5)

## 2017-01-31 LAB — CREATININE, SERUM
CREATININE: 0.9 mg/dL (ref 0.61–1.24)
GFR calc Af Amer: >60 mL/min (ref >60)

## 2017-01-31 LAB — MRSA PCR SCREENING: MRSA BY PCR: NEGATIVE

## 2017-01-31 LAB — HEMOGLOBIN A1C
HEMOGLOBIN A1C: 11.8 % — AB (ref 4.8–5.6)
MEAN PLASMA GLUCOSE: 292 mg/dL

## 2017-01-31 SURGERY — RIGHT/LEFT HEART CATH AND CORONARY ANGIOGRAPHY

## 2017-01-31 MED ORDER — SODIUM CHLORIDE 0.9 % IV SOLN: 250.0000 mL | INTRAVENOUS | Status: DC | PRN

## 2017-01-31 MED ORDER — LIDOCAINE HCL (PF) 1 % IJ SOLN
INTRAMUSCULAR | Status: AC
  Filled 2017-01-31: qty 30

## 2017-01-31 MED ORDER — SODIUM CHLORIDE 0.9 % IV SOLN
INTRAVENOUS | Status: DC | PRN
  Administered 2017-01-31: 10 mL/h via INTRAVENOUS

## 2017-01-31 MED ORDER — FENTANYL CITRATE (PF) 100 MCG/2ML IJ SOLN
INTRAMUSCULAR | Status: DC | PRN
  Administered 2017-01-31: 25 ug via INTRAVENOUS

## 2017-01-31 MED ORDER — ATORVASTATIN CALCIUM 80 MG PO TABS
80.0000 mg | ORAL_TABLET | Freq: Every day | ORAL | Status: DC
  Administered 2017-01-31 – 2017-02-01 (×2): 80 mg via ORAL
  Filled 2017-01-31 (×2): qty 1

## 2017-01-31 MED ORDER — HEPARIN (PORCINE) IN NACL 2-0.9 UNIT/ML-% IJ SOLN
INTRAMUSCULAR | Status: AC
  Filled 2017-01-31: qty 1000

## 2017-01-31 MED ORDER — FENTANYL CITRATE (PF) 100 MCG/2ML IJ SOLN
INTRAMUSCULAR | Status: AC
  Filled 2017-01-31: qty 2

## 2017-01-31 MED ORDER — ONDANSETRON HCL 4 MG/2ML IJ SOLN: 4.0000 mg | Freq: Four times a day (QID) | INTRAMUSCULAR | Status: DC | PRN

## 2017-01-31 MED ORDER — IOPAMIDOL (ISOVUE-370) INJECTION 76%
INTRAVENOUS | Status: DC | PRN
Start: 2017-01-31 — End: 2017-01-31
  Administered 2017-01-31: 70 mL via INTRA_ARTERIAL

## 2017-01-31 MED ORDER — ENOXAPARIN SODIUM 40 MG/0.4ML ~~LOC~~ SOLN
40.0000 mg | SUBCUTANEOUS | Status: DC
  Administered 2017-02-01: 40 mg via SUBCUTANEOUS
  Filled 2017-01-31 (×2): qty 0.4

## 2017-01-31 MED ORDER — ACETAMINOPHEN 325 MG PO TABS: 650.0000 mg | ORAL_TABLET | ORAL | Status: DC | PRN

## 2017-01-31 MED ORDER — IOPAMIDOL (ISOVUE-370) INJECTION 76%
INTRAVENOUS | Status: AC
  Filled 2017-01-31: qty 100

## 2017-01-31 MED ORDER — MIDAZOLAM HCL 2 MG/2ML IJ SOLN
INTRAMUSCULAR | Status: AC
  Filled 2017-01-31: qty 2

## 2017-01-31 MED ORDER — LIDOCAINE HCL (PF) 1 % IJ SOLN
INTRAMUSCULAR | Status: DC | PRN
  Administered 2017-01-31: 20 mL

## 2017-01-31 MED ORDER — SODIUM CHLORIDE 0.9% FLUSH: 3.0000 mL | INTRAVENOUS | Status: DC | PRN

## 2017-01-31 MED ORDER — ASPIRIN 81 MG PO CHEW
81.0000 mg | CHEWABLE_TABLET | Freq: Every day | ORAL | Status: DC
  Administered 2017-02-01 – 2017-02-02 (×2): 81 mg via ORAL
  Filled 2017-01-31 (×2): qty 1

## 2017-01-31 MED ORDER — SODIUM CHLORIDE 0.9% FLUSH
3.0000 mL | Freq: Two times a day (BID) | INTRAVENOUS | Status: DC
  Administered 2017-02-01 – 2017-02-02 (×3): 3 mL via INTRAVENOUS

## 2017-01-31 MED ORDER — HEPARIN (PORCINE) IN NACL 2-0.9 UNIT/ML-% IJ SOLN
INTRAMUSCULAR | Status: DC | PRN
  Administered 2017-01-31: 1500 mL

## 2017-01-31 MED ORDER — MIDAZOLAM HCL 2 MG/2ML IJ SOLN
INTRAMUSCULAR | Status: DC | PRN
  Administered 2017-01-31: 2 mg via INTRAVENOUS

## 2017-01-31 MED ORDER — SODIUM CHLORIDE 0.9 % IV SOLN: INTRAVENOUS | Status: AC

## 2017-01-31 SURGICAL SUPPLY — 10 items
CATH INFINITI 5FR MULTPACK ANG (CATHETERS) ×2 IMPLANT
CATH SWAN GANZ 7F STRAIGHT (CATHETERS) ×2 IMPLANT
KIT HEART LEFT (KITS) ×3 IMPLANT
KIT HEART RIGHT NAMIC (KITS) ×2 IMPLANT
PACK CARDIAC CATHETERIZATION (CUSTOM PROCEDURE TRAY) ×3 IMPLANT
SHEATH PINNACLE 5F 10CM (SHEATH) ×2 IMPLANT
SHEATH PINNACLE 7F 10CM (SHEATH) ×2 IMPLANT
SYR MEDRAD MARK V 150ML (SYRINGE) ×3 IMPLANT
TRANSDUCER W/STOPCOCK (MISCELLANEOUS) ×5 IMPLANT
WIRE EMERALD 3MM-J .035X150CM (WIRE) ×2 IMPLANT

## 2017-01-31 NOTE — Progress Notes (Signed)
PROGRESS NOTE    Jeffrey [name]  GNF:[id number] DOB: Jan 19, 1966 DOA: 01/28/2017 PCP: Jeffrey Morale, MD    Brief Narrative:  51 y.o. male with a past medical history significant for IDDM c/b neuropathy and toe amputations, and HTN who presents with 1 week dyspnea on exertion and cough.  He was in his usual state of health until about a week or so ago when he developed cough and chest congestion. He thought it was a upper respiratory infection, so he tried drinking more water, and taking OTC cough meds, but none of these helped at all. His symptoms slowly progressed over the last week, until he had some noticeable dyspnea on exertion, feeling of smothering when he lays flat, relieved with sitting up, and worse cough, so finally he came into the ER. He's had no chest pain, no fever, no chills, no nasal congestion, no rhinorrhea.  Patient was found to have signs of volume overloaded, found to be in acute heart failure. Patient improved with IV lasix. 2d echo demonstrated an EF of 20-25%. Cardiology was consulted and patient has been referred for L and R heart cath.  Assessment & Plan:   Principal Problem:   Acute CHF (congestive heart failure) (HCC) Active Problems:   Essential hypertension   Uncontrolled type 2 diabetes mellitus with hyperglycemia, with long-term current use of insulin (HCC)   Anemia, iron deficiency  1. New onset acute systolic CHF:  - 2d echo with EF 20-25% with no wall motion abnormality - Continued on Furosemide 40 mg IV twice a day -TSH within normal limits -Strict I's and O's -Continue daily BMP -Continue ACEI - Cardiology following - Patient is for L and R heart cath today. Discussed with Cardiology. Given severity of heart failure, Cardiology recommendations to keep pt at [facility] for more aggressive CHF management  2. Insulin-dependent diabetes:  - Hyperglycemic at admission. -Presently on  lantus 55 nightly -SSI with meals - glucose trends reviewed. Stable  thus far  3. Essential hypertension:  - Hypertensive at admission. -Continued on lisinopril -Continue lasix per above - Continue coreg - Titrate bp meds as tolerated. Stable at present  4. Microcytic anemia, presumed iron deficiency:  -Iron borderline low at 33 -recommend outpatient colonoscopy when more stable - Currently stable  5. Hypokalemia - Corrected - Will repeat BMET in AM  6. Hypomagnesemia  - was corrected  DVT prophylaxis: Lovenox Code Status: Full Family Communication: Patient not seen. Was taken to cath lab. Pt was seen by Cardiology Disposition Plan: Uncertain at this time  Consultants:   Cardiology  Procedures:   2d echo 2/18 with EF 20-25%  Antimicrobials: Anti-infectives    None      Subjective: Not seen, but was seen by Cardiology. Pt transferred this AM for heart cath  Objective: Vitals:   01/31/17 1136 01/31/17 1159 01/31/17 1200 01/31/17 1205  BP: 127/86 128/88  130/89  Pulse: 83 82 82 80  Resp: (!) 24 (!) 21 (!) 21 (!) 23  Temp:      TempSrc:      SpO2: 99% 100% 98% 100%  Weight:      Height:        Intake/Output Summary (Last 24 hours) at 01/31/17 1544 Last data filed at 01/31/17 0639  Gross per 24 hour  Intake                0 ml  Output              700 ml  Net             -700 ml   Filed Weights   01/29/17 0542 01/30/17 0538 01/31/17 9211  Weight: 86.7 kg (191 lb 3.2 oz) 86.7 kg (191 lb 3.2 oz) 87.9 kg (193 lb 12.6 oz)    Examination:  Not seen as pt was transferred to [facility] for heart cath. Pt was seen by Cardiology  Data Reviewed: I have personally reviewed following labs and imaging studies  CBC:  Recent Labs Lab 01/28/17 2127  WBC 4.7  NEUTROABS 3.1  HGB 11.5*  HCT 36.4*  MCV 74.0*  PLT 941   Basic Metabolic Panel:  Recent Labs Lab 01/28/17 2127 01/29/17 0530 01/30/17 0524 01/31/17 0507  NA 140 141 137 139  K 3.5 3.0* 3.1* 3.9  CL 111 108 107 106  CO2 23 25 24 28   GLUCOSE 283* 124* 95 149*    BUN 10 9 14 17   CREATININE 0.82 0.87 1.01 1.09  CALCIUM 9.0 9.0 8.8* 9.1  MG  --  1.5* 1.8  --    GFR: Estimated Creatinine Clearance: 84.8 mL/min (by C-G formula based on SCr of 1.09 mg/dL). Liver Function Tests:  Recent Labs Lab 01/28/17 2127  AST 16  ALT 14*  ALKPHOS 74  BILITOT 0.5  PROT 7.3  ALBUMIN 3.2*   No results for input(s): LIPASE, AMYLASE in the last 168 hours. No results for input(s): AMMONIA in the last 168 hours. Coagulation Profile:  Recent Labs Lab 01/30/17 1835  INR 0.94   Cardiac Enzymes:  Recent Labs Lab 01/28/17 2127 01/29/17 0530  TROPONINI <0.03 <0.03   BNP (last 3 results) No results for input(s): PROBNP in the last 8760 hours. HbA1C:  Recent Labs  01/29/17 0530  HGBA1C 11.8*   CBG:  Recent Labs Lab 01/30/17 1245 01/30/17 1713 01/30/17 2157 01/31/17 0723 01/31/17 1044  GLUCAP 155* 222* 202* 152* 59*   Lipid Profile: No results for input(s): CHOL, HDL, LDLCALC, TRIG, CHOLHDL, LDLDIRECT in the last 72 hours. Thyroid Function Tests:  Recent Labs  01/29/17 0530  TSH 0.847   Anemia Panel:  Recent Labs  01/29/17 0530  FERRITIN 28  TIBC 308  IRON 33*   Sepsis Labs: No results for input(s): PROCALCITON, LATICACIDVEN in the last 168 hours.  No results found for this or any previous visit (from the past 240 hour(s)).   Radiology Studies: No results found.  Scheduled Meds: . sodium chloride   Intravenous Once  . [MAR Hold] carvedilol  3.125 mg Oral BID WC  . [MAR Hold] enoxaparin (LOVENOX) injection  40 mg Subcutaneous QHS  . [MAR Hold] ferrous sulfate  325 mg Oral BID WC  . [MAR Hold] furosemide  40 mg Intravenous BID  . [MAR Hold] gabapentin  400 mg Oral TID  . [MAR Hold] Influenza vac split quadrivalent PF  0.5 mL Intramuscular Tomorrow-1000  . [MAR Hold] insulin aspart  0-15 Units Subcutaneous TID WC  . [MAR Hold] insulin aspart  0-5 Units Subcutaneous QHS  . [MAR Hold] insulin glargine  55 Units  Subcutaneous Q2200  . [MAR Hold] lisinopril  20 mg Oral Daily  . [MAR Hold] nitroGLYCERIN  0.5 inch Topical Q8H  . [MAR Hold] sodium chloride flush  3 mL Intravenous Q12H  . sodium chloride flush  3 mL Intravenous Q12H   Continuous Infusions:   LOS: 3 days   CHIU, Orpah Melter, MD [facility] Pager [phone]  If 7PM-7AM, please contact night-coverage [email or web address] Password [facility] 01/31/2017, 3:44  PM    

## 2017-01-31 NOTE — Interval H&P Note (Signed)
Cath Lab Visit (complete for each Cath Lab visit)  Clinical Evaluation Leading to the Procedure:   ACS: No.  Non-ACS:    Anginal Classification: CCS III  Anti-ischemic medical therapy: Minimal Therapy (1 class of medications)  Non-Invasive Test Results: No non-invasive testing performed  Prior CABG: No previous CABG      History and Physical Interval Note:  01/31/2017 9:25 AM  Jeffrey [name]  has presented today for surgery, with the diagnosis of cp  The various methods of treatment have been discussed with the patient and family. After consideration of risks, benefits and other options for treatment, the patient has consented to  Procedure(s): Right/Left Heart Cath and Coronary Angiography (N/A) as a surgical intervention .  The patient's history has been reviewed, patient examined, no change in status, stable for surgery.  I have reviewed the patient's chart and labs.  Questions were answered to the patient's satisfaction.     Shelva Majestic

## 2017-01-31 NOTE — H&P (View-Only) (Signed)
Cardiology Consult    Patient ID: Jeffrey [name] MRN: [id number], DOB/AGE: [birth year]   Admit date: 01/28/2017 Date of Consult: 01/30/2017  Primary Physician: Jeffrey Morale, MD Primary Cardiologist: New Requesting Provider: Dr. Wyline [name] Reason for Consultation: New HF  Patient Profile    51 yo male with PMH of IDDM, HTN and toe amputations who presented with progressive ongoing dyspnea.   Past Medical History   Past Medical History:  Diagnosis Date  . Diabetes mellitus   . Diabetic foot infection ([place]) 03/2016   RT FOOT  . HTN (hypertension)     Past Surgical History:  Procedure Laterality Date  . AMPUTATION Right 04/01/2016   Procedure: Right Great Toe Amputation;  Surgeon: Jeffrey Minion, MD;  Location: [place];  Service: Orthopedics;  Laterality: Right;  . AMPUTATION Right 06/19/2016   Procedure: AMPUTATION SECOND TOE;  Surgeon: Jeffrey Killings, MD;  Location: [place];  Service: Orthopedics;  Laterality: Right;  . BACK SURGERY     for abscess     Allergies  No Known Allergies  History of Present Illness    Jeffrey [name] is a 51 yo male with PMH of IDDM, HTN and toe amputations. Reports he [facility]. States he was dx with HTN about a year ago, but has been diabetic for about 10 years in insulin. Had toe amputations about a year ago by Jeffrey [name] after developing infection. Denies any personal or family hx of CAD. Has never had ischemic work up in the past.   Reports he is fairly active, walks to the mailbox and around the block at his home. Does not normal experience any anginal symptoms. About a week ago developed productive cough and congestion. Treated with OTC medications, with little improvement. Symptoms slowly progressed and dyspnea worsened. Developed orthopnea, and LE edema.   Presented to the [facility] ED on 01/28/17. Labs showed stable electrolytes, BNP 206, Hgb 11.5. CXR with cardiomegaly and edema. EKG showed SR with LVH, and  nonspecific T wave changes in the anteroseptal leads. Trop neg x2. He was admitted and started on IV lasix. TSH normal. Echo this admission demonstrated severely reduced EF of 20-25%, but no WMA.   Inpatient Medications    . enoxaparin (LOVENOX) injection  40 mg Subcutaneous QHS  . ferrous sulfate  325 mg Oral BID WC  . furosemide  40 mg Intravenous BID  . gabapentin  400 mg Oral TID  . Influenza vac split quadrivalent PF  0.5 mL Intramuscular Tomorrow-1000  . insulin aspart  0-15 Units Subcutaneous TID WC  . insulin aspart  0-5 Units Subcutaneous QHS  . insulin glargine  55 Units Subcutaneous Q2200  . lisinopril  20 mg Oral Daily  . nitroGLYCERIN  0.5 inch Topical Q8H  . potassium chloride  60 mEq Oral BID  . sodium chloride flush  3 mL Intravenous Q12H    Family History    Family History  Problem Relation Age of Onset  . Diabetes Mother   . Hypertension Mother   . Diabetes Father   . Heart attack Father   . Diabetes Sister   . Heart attack Maternal Grandmother     Social History    Social History   Social History  . Marital status: Significant Other    Spouse name: N/A  . Number of children: N/A  . Years of education: N/A   Occupational History  . Not on file.   Social History  Main Topics  . Smoking status: Never Smoker  . Smokeless tobacco: Never Used  . Alcohol use No  . Drug use: No  . Sexual activity: Not on file   Other Topics Concern  . Not on file   Social History Narrative  . No narrative on file     Review of Systems    General:  No chills, fever, night sweats or weight changes.  Cardiovascular:  No chest pain, dyspnea on exertion, ++ edema, ++ orthopnea, palpitations, paroxysmal nocturnal dyspnea. Dermatological: No rash, lesions/masses Respiratory: No cough, ++ dyspnea Urologic: No hematuria, dysuria Abdominal:   No nausea, vomiting, diarrhea, bright red blood per rectum, melena, or hematemesis Neurologic:  No visual changes, wkns, changes  in mental status. All other systems reviewed and are otherwise negative except as noted above.  Physical Exam    Blood pressure 119/66, pulse 86, temperature 98.9 F (37.2 C), temperature source Oral, resp. rate 20, height 5\' 7"  (1.702 m), weight 191 lb 3.2 oz (86.7 kg), SpO2 97 %.  General: Pleasant younger AAM, NAD Psych: Normal affect. Neuro: Alert and oriented X 3. Moves all extremities spontaneously. HEENT: Normal  Neck: Supple without bruits or JVD. Lungs:  Resp regular and unlabored, CTA. Heart: RRR no s3, s4, or murmurs. Abdomen: Soft, non-tender, non-distended, BS + x 4.  Extremities: No clubbing, cyanosis or edema. DP/PT/Radials 2+ and equal bilaterally. Toe amputation to right foot.   Labs    Troponin (Point of Care Test) No results for input(s): TROPIPOC in the last 72 hours.  Recent Labs  01/28/17 2127 01/29/17 0530  TROPONINI <0.03 <0.03   Lab Results  Component Value Date   WBC 4.7 01/28/2017   HGB 11.5 (L) 01/28/2017   HCT 36.4 (L) 01/28/2017   MCV 74.0 (L) 01/28/2017   PLT 292 01/28/2017    Recent Labs Lab 01/28/17 2127  01/30/17 0524  NA 140  < > 137  K 3.5  < > 3.1*  CL 111  < > 107  CO2 23  < > 24  BUN 10  < > 14  CREATININE 0.82  < > 1.01  CALCIUM 9.0  < > 8.8*  PROT 7.3  --   --   BILITOT 0.5  --   --   ALKPHOS 74  --   --   ALT 14*  --   --   AST 16  --   --   GLUCOSE 283*  < > 95  < > = values in this interval not displayed. Lab Results  Component Value Date   CHOL 191 08/21/2013   HDL 42 08/21/2013   LDLCALC 93 08/21/2013   TRIG 281 (H) 08/21/2013   Lab Results  Component Value Date   DDIMER <0.27 12/05/2014     Radiology Studies    Dg Chest 2 View  Result Date: 01/28/2017 CLINICAL DATA:  Patient with cough and chills.  Shortness of breath. EXAM: CHEST  2 VIEW COMPARISON:  Chest radiograph 11/15/2016 FINDINGS: Cardiomegaly. Tortuosity of the thoracic aorta. Pulmonary vascular redistribution and interstitial opacities  bilaterally. Thoracic spine degenerative changes. No pleural effusion or pneumothorax. IMPRESSION: Cardiomegaly and mild interstitial edema. Electronically Signed   By: Lovey Newcomer M.D.   On: 01/28/2017 20:30    ECG & Cardiac Imaging    EKG: SR with LVH, and nonspecific T wave changes in the anteroseptal leads  Echo: 01/29/17  Study Conclusions  - Left ventricle: The cavity size was mildly dilated. Systolic  function was severely reduced. The estimated ejection fraction   was in the range of 20% to 25%. Wall motion was normal; there   were no regional wall motion abnormalities. - Left atrium: The atrium was mildly dilated.  Assessment & Plan    51 yo male with PMH of IDDM, HTN and toe amputations who presented with progressive ongoing dyspnea.   1. Acute systolic HF: Presented with progressive dyspnea and orthopnea. BNP mildly elevated, and CXR with edema and cardiomegaly. Echo this admission showed severely reduced EF of 20-25% no WMA. Denies any anginal symptoms prior to admission.  -- on IV lasix 40mg  BID with good UOP thus far, reports his breathing has improved since admission.  -- Question whether this is ICM vs NICM. For now would add on low dose coreg 3.125mg  BID. Currently on ACEi, but may be a Entresto candidate though are financial concerns present.  -- continue ACEi  -- needs ischemic evaluation, make NPO and plan for R/L cardiac catheterization tomorrow.  -- consider the addition of spironolactone as blood pressure allows  -- check lipid for risk stratification   2. HTN: noted to be hypertensive on admission, but states generally controlled at home. Recommendations as above.   3. IDDM: CBGs uncontrolled in the out/inpatient setting. Hgb A1c pending, last 10/17 12.4   Barnet Pall, NP-C Pager [phone] 01/30/2017, 12:37 PM  Attending Note:   The patient was seen and examined.  Agree with assessment and plan as noted above.  Changes made to the above  note as needed.  Patient seen and independently examined with [name], NP.   We discussed all aspects of the encounter. I agree with the assessment and plan as stated above.  1. Acute on chronic systolic congestive heart failure: Patient presents with several weeks of increasing shortness of breath. His echocardiogram reveals a severely depressed left ventricle systolic function with an EF of 20-25%. He's been treated with IV Lasix and has diuresed nicely. He is breathing better. He has had a long history of hypertension but has not been taking medications.  I would like to proceed with a cardiac cath tomorrow for further evaluation.   He has a history of diabetes mellitus and might be having silent ischemia.   We'll do a right and left heart catheterization.   We will start him on carvedilol 3.125 mg twice a day. He's currently on lisinopril. We discussed possibly starting him on chest oh but I'm not sure that he'll be able to afford that. I would rather stick with lisinopril for now. We will also consider adding spironolactone in the near future.  We have discussed the risks, benefits, and options regarding heart catheterization. He understands and agrees to proceed.   I have spent a total of 40 minutes with patient reviewing hospital  notes , telemetry, EKGs, labs and examining patient as well as establishing an assessment and plan that was discussed with the patient. > 50% of time was spent in direct patient care.    Thayer Headings, Brooke Bonito., MD, [facility] 01/30/2017, 2:22 PM [address],  [place] Pager [phone]

## 2017-01-31 NOTE — Progress Notes (Signed)
Progress Note  Patient Name: Jeffrey [name] Date of Encounter: 01/31/2017  Primary Cardiologist: Dr. Acie Fredrickson  Subjective   No complaints this morning. Planned for [facility].   Inpatient Medications    Scheduled Meds: . sodium chloride   Intravenous Once  . carvedilol  3.125 mg Oral BID WC  . enoxaparin (LOVENOX) injection  40 mg Subcutaneous QHS  . ferrous sulfate  325 mg Oral BID WC  . furosemide  40 mg Intravenous BID  . gabapentin  400 mg Oral TID  . Influenza vac split quadrivalent PF  0.5 mL Intramuscular Tomorrow-1000  . insulin aspart  0-15 Units Subcutaneous TID WC  . insulin aspart  0-5 Units Subcutaneous QHS  . insulin glargine  55 Units Subcutaneous Q2200  . lisinopril  20 mg Oral Daily  . nitroGLYCERIN  0.5 inch Topical Q8H  . sodium chloride flush  3 mL Intravenous Q12H  . sodium chloride flush  3 mL Intravenous Q12H   Continuous Infusions:  PRN Meds: sodium chloride, acetaminophen **OR** acetaminophen, benzonatate, hydrALAZINE, sodium chloride flush   Vital Signs    Vitals:   01/30/17 1300 01/30/17 2150 01/31/17 0541 01/31/17 0638  BP: 129/72 115/76 (!) 128/92   Pulse: 87 88 85   Resp: 18 18 18    Temp: 98.1 F (36.7 C) 98.7 F (37.1 C) 98.9 F (37.2 C)   TempSrc: Oral Oral Oral   SpO2: 96% 98% 100%   Weight:    193 lb 12.6 oz (87.9 kg)  Height:        Intake/Output Summary (Last 24 hours) at 01/31/17 0759 Last data filed at 01/31/17 6314  Gross per 24 hour  Intake              480 ml  Output             2350 ml  Net            -1870 ml   Filed Weights   01/29/17 0542 01/30/17 0538 01/31/17 9702  Weight: 191 lb 3.2 oz (86.7 kg) 191 lb 3.2 oz (86.7 kg) 193 lb 12.6 oz (87.9 kg)    Telemetry    SR - Personally Reviewed  ECG    N/a - Personally Reviewed  Physical Exam   General: Well developed, well nourished, male appearing in no acute distress. Head: Normocephalic, atraumatic.  Neck: Supple without bruits, JVD. Lungs:  Resp  regular and unlabored, CTA. Heart: RRR, S1, S2, no S3, S4, or murmur; no rub. Abdomen: Soft, non-tender, non-distended with normoactive bowel sounds. No hepatomegaly. No rebound/guarding. No obvious abdominal masses. Extremities: No clubbing, cyanosis, edema. Distal pedal pulses are 2+ bilaterally. Neuro: Alert and oriented X 3. Moves all extremities spontaneously. Psych: Normal affect.  Labs    Chemistry Recent Labs Lab 01/28/17 2127 01/29/17 0530 01/30/17 0524 01/31/17 0507  NA 140 141 137 139  K 3.5 3.0* 3.1* 3.9  CL 111 108 107 106  CO2 23 25 24 28   GLUCOSE 283* 124* 95 149*  BUN 10 9 14 17   CREATININE 0.82 0.87 1.01 1.09  CALCIUM 9.0 9.0 8.8* 9.1  PROT 7.3  --   --   --   ALBUMIN 3.2*  --   --   --   AST 16  --   --   --   ALT 14*  --   --   --   ALKPHOS 74  --   --   --   BILITOT 0.5  --   --   --  GFRNONAA >60 >60 >60 >60  GFRAA >60 >60 >60 >60  ANIONGAP 6 8 6 5      Hematology Recent Labs Lab 01/28/17 2127  WBC 4.7  RBC 4.92  HGB 11.5*  HCT 36.4*  MCV 74.0*  MCH 23.4*  MCHC 31.6  RDW 15.7*  PLT 292    Cardiac Enzymes Recent Labs Lab 01/28/17 2127 01/29/17 0530  TROPONINI <0.03 <0.03   No results for input(s): TROPIPOC in the last 168 hours.   BNP Recent Labs Lab 01/28/17 2127  BNP 206.5*     DDimer No results for input(s): DDIMER in the last 168 hours.    Radiology    No results found.  Cardiac Studies   TTE: 01/29/17  Study Conclusions  - Left ventricle: The cavity size was mildly dilated. Systolic   function was severely reduced. The estimated ejection fraction   was in the range of 20% to 25%. Wall motion was normal; there   were no regional wall motion abnormalities. - Left atrium: The atrium was mildly dilated.  Patient Profile     51 y.o. male PMH of IDDM, HTN and toe amputations who presented with progressive ongoing dyspnea. Found to have a reduced EF (25%).   Assessment & Plan    1. Acute systolic HF: Presented  with progressive dyspnea and orthopnea. BNP mildly elevated, and CXR with edema and cardiomegaly. Echo this admission showed severely reduced EF of 20-25% no WMA. Denies any anginal symptoms prior to admission.  -- on IV lasix 40mg  BID with good UOP thus far, reports his breathing has improved since admission. Will hold this morning given plans for cath.  -- Question whether this is ICM vs NICM. Added low dose coreg 3.125mg  BID. -- continue ACEi  -- R/L HC today, follow up on recommendations -- consider the addition of spironolactone as blood pressure allows  -- check lipid for risk stratification   2. HTN: noted to be hypertensive on admission, but states generally controlled at home. Recommendations as above.   3. IDDM: CBGs uncontrolled in the out/inpatient setting. Hgb A1c 11.8 this admission.  Signed, [name], NP  01/31/2017, 7:59 AM    Attending Note:   The patient was seen and examined.  Agree with assessment and plan as noted above.  Changes made to the above note as needed.  Patient seen and independently examined with [name], NP .   We discussed all aspects of the encounter. I agree with the assessment and plan as stated above.  1.  Chronic systolic CHF:   EF 41-66% by echo  The LV gram shows severe LV dysfunction - Dr. Claiborne Billings thought the EF was perhaps 10 %  Minimal CAD - ( not enough to explain his CHF symptoms )   Will have the patient stay here at [facility] for further management . Will continue CHF meds.       I have spent a total of 40 minutes with patient reviewing hospital  notes , telemetry, EKGs, labs and examining patient as well as establishing an assessment and plan that was discussed with the patient. > 50% of time was spent in direct patient care.    Thayer Headings, Brooke Bonito., MD, [facility] 01/31/2017, 3:41 PM [address],  [place] Pager [phone]

## 2017-01-31 NOTE — Progress Notes (Addendum)
Site area: Right groin a 5 french arterial and 7 french venous sheath was removed  Site Prior to Removal:  Level 0  Pressure Applied For 20 MINUTES    Bedrest Beginning at 1110a  Manual:   Yes.    Patient Status During Pull:  stable  Post Pull Groin Site:  Level 0  Post Pull Instructions Given:  Yes.    Post Pull Pulses Present:  Yes.    Dressing Applied:  Yes.  pressure dressing applied  Comments:  VS remain stable during sheath pull

## 2017-01-31 NOTE — Telephone Encounter (Signed)
This CM informed Dessa Phi, RN CM of the TCC appointment at [facility]  - 02/08/17 @ 0945 and informed her that the paitent does not have a glucometer. Requested that a trumetrix meter, test strips and lancets be ordered prior to discharge.  Will continue to follow hospital course

## 2017-02-01 ENCOUNTER — Encounter (HOSPITAL_COMMUNITY): Payer: Self-pay | Admitting: Cardiovascular Disease

## 2017-02-01 ENCOUNTER — Ambulatory Visit: Payer: Self-pay | Admitting: Family Medicine

## 2017-02-01 DIAGNOSIS — E1165 Type 2 diabetes mellitus with hyperglycemia: Secondary | ICD-10-CM

## 2017-02-01 DIAGNOSIS — D509 Iron deficiency anemia, unspecified: Secondary | ICD-10-CM

## 2017-02-01 DIAGNOSIS — I10 Essential (primary) hypertension: Secondary | ICD-10-CM

## 2017-02-01 DIAGNOSIS — Z794 Long term (current) use of insulin: Secondary | ICD-10-CM

## 2017-02-01 LAB — BASIC METABOLIC PANEL
ANION GAP: 7 (ref 5–15)
BUN: 12 mg/dL (ref 6–20)
CO2: 25 mmol/L (ref 22–32)
Calcium: 8.8 mg/dL — ABNORMAL LOW (ref 8.9–10.3)
Chloride: 102 mmol/L (ref 101–111)
Creatinine, Ser: 1.04 mg/dL (ref 0.61–1.24)
GFR calc Af Amer: >60 mL/min (ref >60)
Glucose, Bld: 161 mg/dL — ABNORMAL HIGH (ref 65–99)
POTASSIUM: 4 mmol/L (ref 3.5–5.1)
SODIUM: 134 mmol/L — AB (ref 135–145)

## 2017-02-01 LAB — CBC
HCT: 40.4 % (ref 39.0–52.0)
Hemoglobin: 12.8 g/dL — ABNORMAL LOW (ref 13.0–17.0)
MCH: 23.7 pg — ABNORMAL LOW (ref 26.0–34.0)
MCHC: 31.7 g/dL (ref 30.0–36.0)
MCV: 74.7 fL — ABNORMAL LOW (ref 78.0–100.0)
PLATELETS: 323 10*3/uL (ref 150–400)
RBC: 5.41 MIL/uL (ref 4.22–5.81)
RDW: 15.9 % — ABNORMAL HIGH (ref 11.5–15.5)
WBC: 5.9 10*3/uL (ref 4.0–10.5)

## 2017-02-01 LAB — LIPID PANEL
CHOL/HDL RATIO: 4.9 ratio
CHOLESTEROL: 207 mg/dL — AB (ref 0–200)
HDL: 42 mg/dL (ref >40)
LDL Cholesterol: 123 mg/dL — ABNORMAL HIGH (ref 0–99)
Triglycerides: 208 mg/dL — ABNORMAL HIGH (ref <150)
VLDL: 42 mg/dL — ABNORMAL HIGH (ref 0–40)

## 2017-02-01 LAB — GLUCOSE, CAPILLARY
GLUCOSE-CAPILLARY: 219 mg/dL — AB (ref 65–99)
GLUCOSE-CAPILLARY: 124 mg/dL — AB (ref 65–99)
Glucose-Capillary: 116 mg/dL — ABNORMAL HIGH (ref 65–99)
Glucose-Capillary: 160 mg/dL — ABNORMAL HIGH (ref 65–99)

## 2017-02-01 MED ORDER — CARVEDILOL 6.25 MG PO TABS
6.2500 mg | ORAL_TABLET | Freq: Two times a day (BID) | ORAL | Status: DC
  Administered 2017-02-01 – 2017-02-02 (×3): 6.25 mg via ORAL
  Filled 2017-02-01 (×2): qty 1

## 2017-02-01 MED ORDER — FUROSEMIDE 40 MG PO TABS
40.0000 mg | ORAL_TABLET | Freq: Every day | ORAL | Status: DC
  Administered 2017-02-01 – 2017-02-02 (×2): 40 mg via ORAL
  Filled 2017-02-01 (×2): qty 1

## 2017-02-01 NOTE — Progress Notes (Signed)
[facility] PROGRESS NOTE  ROCKWELL ZENTZ QMG:[id number] DOB: Dec 14, 1965 DOA: 01/28/2017  PCP: Arnoldo Morale, MD  Brief History/Interval Summary: 51 year old African-American male with a past medical history of insulin-dependent diabetes with neuropathy and history of toe amputations, history of hypertension, presented with one-week history of shortness of breath with exertion. Patient was found to be volume overloaded. Echocardiogram showed EF of 20-25%. Cardiology was consulted.  Reason for Visit: Acute systolic CHF  Consultants: Cardiology  Procedures:  Transthoracic echocardiogram Study Conclusions  - Left ventricle: The cavity size was mildly dilated. Systolic   function was severely reduced. The estimated ejection fraction   was in the range of 20% to 25%. Wall motion was normal; there   were no regional wall motion abnormalities. - Left atrium: The atrium was mildly dilated.  Cardiac catheterization Conclusion     1st RPLB lesion, 60 %stenosed.  Dist RCA lesion, 60 %stenosed.  Prox RCA lesion, 55 %stenosed.  Prox LAD lesion, 10 %stenosed.  There is severe left ventricular systolic dysfunction.  LV end diastolic pressure is mildly elevated.   Severe global LV dysfunction with an ejection fraction of 10-15%.  The pattern is one of a nonischemic cardiomyopathy.  Mild coronary obstructive disease with smooth 10% narrowing in the LAD; normal ramus intermediate, normal left circumflex; and RCA with 50-60% proximal stenosis and distal tapering of 60%.  Prior to giving rise to 3 small distal branches with 60% narrowing in a small inferior LV branch.  The patient's LV dysfunction is out of proportion to his CAD.     Antibiotics: None  Subjective/Interval History: Patient feels better. Denies any shortness of breath this morning. Denies any chest pain. No nausea or vomiting.   ROS: Denies any headaches  Objective:  Vital Signs  Vitals:   02/01/17  0500 02/01/17 0513 02/01/17 0740 02/01/17 1112  BP:  135/86 110/83 (!) 88/63  Pulse:  85 89 95  Resp:  (!) 22 14 16   Temp:  99.1 F (37.3 C) 98.4 F (36.9 C) 98.9 F (37.2 C)  TempSrc:  Oral Oral Oral  SpO2:   98% 98%  Weight: 87.8 kg (193 lb 9 oz)     Height:        Intake/Output Summary (Last 24 hours) at 02/01/17 1148 Last data filed at 01/31/17 2200  Gross per 24 hour  Intake                0 ml  Output             1800 ml  Net            -1800 ml   Filed Weights   01/30/17 0538 01/31/17 0638 02/01/17 0500  Weight: 86.7 kg (191 lb 3.2 oz) 87.9 kg (193 lb 12.6 oz) 87.8 kg (193 lb 9 oz)    General appearance: alert, cooperative, appears stated age and no distress Resp: Very few crackles at the bases. No wheezing. No rhonchi. Cardio: regular rate and rhythm, S1, S2 normal, no murmur, click, rub or gallop GI: soft, non-tender; bowel sounds normal; no masses,  no organomegaly Extremities: extremities normal, atraumatic, no cyanosis or edema Neurologic: Awake and alert. Oriented 3. No focal neurological deficits.  Lab Results:  Data Reviewed: I have personally reviewed following labs and imaging studies  CBC:  Recent Labs Lab 01/28/17 2127 01/31/17 1822 02/01/17 0259  WBC 4.7 5.3 5.9  NEUTROABS 3.1  --   --   HGB 11.5* 13.6 12.8*  HCT  36.4* 42.9 40.4  MCV 74.0* 74.6* 74.7*  PLT 292 320 737    Basic Metabolic Panel:  Recent Labs Lab 01/28/17 2127 01/29/17 0530 01/30/17 0524 01/31/17 0507 01/31/17 1822 02/01/17 0259  NA 140 141 137 139  --  134*  K 3.5 3.0* 3.1* 3.9  --  4.0  CL 111 108 107 106  --  102  CO2 23 25 24 28   --  25  GLUCOSE 283* 124* 95 149*  --  161*  BUN 10 9 14 17   --  12  CREATININE 0.82 0.87 1.01 1.09 0.90 1.04  CALCIUM 9.0 9.0 8.8* 9.1  --  8.8*  MG  --  1.5* 1.8  --   --   --     GFR: Estimated Creatinine Clearance: 88.9 mL/min (by C-G formula based on SCr of 1.04 mg/dL).  Liver Function Tests:  Recent Labs Lab  01/28/17 2127  AST 16  ALT 14*  ALKPHOS 74  BILITOT 0.5  PROT 7.3  ALBUMIN 3.2*     Coagulation Profile:  Recent Labs Lab 01/30/17 1835  INR 0.94    Cardiac Enzymes:  Recent Labs Lab 01/28/17 2127 01/29/17 0530  TROPONINI <0.03 <0.03    CBG:  Recent Labs Lab 01/31/17 1558 01/31/17 1718 01/31/17 2147 02/01/17 0744 02/01/17 1115  GLUCAP 192* 174* 175* 116* 219*    Lipid Profile:  Recent Labs  02/01/17 0259  CHOL 207*  HDL 42  LDLCALC 123*  TRIG 208*  CHOLHDL 4.9     Recent Results (from the past 240 hour(s))  MRSA PCR Screening     Status: None   Collection Time: 01/31/17  4:57 PM  Result Value Ref Range Status   MRSA by PCR NEGATIVE NEGATIVE Final    Comment:        The GeneXpert MRSA Assay (FDA approved for NASAL specimens only), is one component of a comprehensive MRSA colonization surveillance program. It is not intended to diagnose MRSA infection nor to guide or monitor treatment for MRSA infections.       Radiology Studies: No results found.   Medications:  Scheduled: . aspirin  81 mg Oral Daily  . atorvastatin  80 mg Oral q1800  . carvedilol  6.25 mg Oral BID WC  . enoxaparin (LOVENOX) injection  40 mg Subcutaneous Q24H  . ferrous sulfate  325 mg Oral BID WC  . furosemide  40 mg Oral Daily  . gabapentin  400 mg Oral TID  . Influenza vac split quadrivalent PF  0.5 mL Intramuscular Tomorrow-1000  . insulin aspart  0-15 Units Subcutaneous TID WC  . insulin aspart  0-5 Units Subcutaneous QHS  . insulin glargine  55 Units Subcutaneous Q2200  . lisinopril  20 mg Oral Daily  . nitroGLYCERIN  0.5 inch Topical Q8H  . sodium chloride flush  3 mL Intravenous Q12H  . sodium chloride flush  3 mL Intravenous Q12H   Continuous:  TGG:YIRSWN chloride, acetaminophen, benzonatate, hydrALAZINE, ondansetron (ZOFRAN) IV, sodium chloride flush  Assessment/Plan:  Principal Problem:   Acute CHF (congestive heart failure) (HCC) Active  Problems:   Essential hypertension   Uncontrolled type 2 diabetes mellitus with hyperglycemia, with long-term current use of insulin (HCC)   Anemia, iron deficiency    New onset acute systolic CHF 2d echo with EF 20-25% with no wall motion abnormality. Patient was started on intravenous diuretics. TSH normal limits. Patient seen by cardiology. ACE inhibitor was continued. Patient underwent cardiac catheterization: Left and right.  No significant CAD was noted. LV dysfunction was thought to be out of proportion to the CAD noted on cardiac catheterization. Medical management is being pursued. Continue carvedilol. LDL is 123. Started on a statin. Cardiology following. Okay for transfer to telemetry.  Insulin-dependent diabetes Hyperglycemic at admission. Continue Lantus and SSI. HbA1c 11.8, implying very poor control. Patient denies noncompliance.  Essential hypertension: Hypertensive at admission. Continue all antihypertensive medications. Needs good blood pressure control.  Microcytic anemia, presumed iron deficiency: Iron low at 33. Ferritin 28. He will need outpatient evaluation once his heart disease is more stable. Recommend outpatient colonoscopy. He needs to discuss this with his PCP. Does not have any overt bleeding.   Hypokalemia Corrected.    DVT Prophylaxis: Lovenox    Code Status: [place] Communication: Discussed with the patient  Disposition Plan: Transfer to telemetry. Mobilize. Anticipate discharge tomorrow.    LOS: 4 days   [facility] Pager [phone] 02/01/2017, 11:48 AM  If 7PM-7AM, please contact night-coverage at [email or web address], password [facility]

## 2017-02-01 NOTE — Care Management Note (Signed)
Case Management Note  Patient Details  Name: Jeffrey [name] MRN: [id number] Date of Birth: Oct 08, 1966  Subjective/Objective:       Adm w chf             Action/Plan: lives w sister    Expected Discharge Date:  02/01/17               Expected Discharge Plan:  Home/Self Care  In-House Referral:     Discharge planning Services  CM Consult, [facility]  Post Acute Care Choice:    Choice offered to:     DME Arranged:    DME Agency:     HH Arranged:    HH Agency:     Status of Service:  In process, will continue to follow  If discussed at Long Length of Stay Meetings, dates discussed:    Additional Comments: [place] [facility] wellness [facility] per transitional care team to see on 2-28.  Lacretia Leigh, RN 02/01/2017, 10:38 AM

## 2017-02-01 NOTE — Progress Notes (Signed)
Progress Note  Patient Name: Jeffrey [name] Date of Encounter: 02/01/2017  Primary Cardiologist: Dr. Acie Fredrickson  Subjective   No complaints this morning.  Was transferred to [facility] after the cath . R/L heart cath revealed no significant CAD EF around 10-15 %   On Coreg 3.125 bid Lasix 40 mg IV BID Lisinopril 20 mg QD     Inpatient Medications    Scheduled Meds: . aspirin  81 mg Oral Daily  . atorvastatin  80 mg Oral q1800  . carvedilol  3.125 mg Oral BID WC  . enoxaparin (LOVENOX) injection  40 mg Subcutaneous Q24H  . ferrous sulfate  325 mg Oral BID WC  . furosemide  40 mg Intravenous BID  . gabapentin  400 mg Oral TID  . Influenza vac split quadrivalent PF  0.5 mL Intramuscular Tomorrow-1000  . insulin aspart  0-15 Units Subcutaneous TID WC  . insulin aspart  0-5 Units Subcutaneous QHS  . insulin glargine  55 Units Subcutaneous Q2200  . lisinopril  20 mg Oral Daily  . nitroGLYCERIN  0.5 inch Topical Q8H  . sodium chloride flush  3 mL Intravenous Q12H  . sodium chloride flush  3 mL Intravenous Q12H   Continuous Infusions:  PRN Meds: sodium chloride, acetaminophen, benzonatate, hydrALAZINE, ondansetron (ZOFRAN) IV, sodium chloride flush   Vital Signs    Vitals:   01/31/17 2316 02/01/17 0500 02/01/17 0513 02/01/17 0740  BP: (!) 107/94  135/86 110/83  Pulse: 88  85 89  Resp: (!) 22  (!) 22 14  Temp: 99 F (37.2 C)  99.1 F (37.3 C) 98.4 F (36.9 C)  TempSrc: Oral  Oral Oral  SpO2: 97%   98%  Weight:  193 lb 9 oz (87.8 kg)    Height:        Intake/Output Summary (Last 24 hours) at 02/01/17 0912 Last data filed at 01/31/17 2200  Gross per 24 hour  Intake                0 ml  Output             1800 ml  Net            -1800 ml   Filed Weights   01/30/17 0538 01/31/17 0638 02/01/17 0500  Weight: 191 lb 3.2 oz (86.7 kg) 193 lb 12.6 oz (87.9 kg) 193 lb 9 oz (87.8 kg)    Telemetry    SR - Personally Reviewed  ECG    N/a - Personally  Reviewed  Physical Exam   General: Well developed, well nourished, male appearing in no acute distress. Head: Normocephalic, atraumatic.  Neck: Supple without bruits, JVD. Lungs:  Resp regular and unlabored, CTA. Heart: RRR, S1, S2, no S3, S4, or murmur; no rub. Abdomen: Soft, non-tender, non-distended with normoactive bowel sounds. No hepatomegaly. No rebound/guarding. No obvious abdominal masses. Extremities: No clubbing, cyanosis, edema. Distal pedal pulses are 2+ bilaterally. Neuro: Alert and oriented X 3. Moves all extremities spontaneously. Psych: Normal affect.  Labs    Chemistry Recent Labs Lab 01/28/17 2127  01/30/17 0524 01/31/17 0507 01/31/17 1822 02/01/17 0259  NA 140  < > 137 139  --  134*  K 3.5  < > 3.1* 3.9  --  4.0  CL 111  < > 107 106  --  102  CO2 23  < > 24 28  --  25  GLUCOSE 283*  < > 95 149*  --  161*  BUN 10  < > 14 17  --  12  CREATININE 0.82  < > 1.01 1.09 0.90 1.04  CALCIUM 9.0  < > 8.8* 9.1  --  8.8*  PROT 7.3  --   --   --   --   --   ALBUMIN 3.2*  --   --   --   --   --   AST 16  --   --   --   --   --   ALT 14*  --   --   --   --   --   ALKPHOS 74  --   --   --   --   --   BILITOT 0.5  --   --   --   --   --   GFRNONAA >60  < > >60 >60 >60 >60  GFRAA >60  < > >60 >60 >60 >60  ANIONGAP 6  < > 6 5  --  7  < > = values in this interval not displayed.   Hematology  Recent Labs Lab 01/28/17 2127 01/31/17 1822 02/01/17 0259  WBC 4.7 5.3 5.9  RBC 4.92 5.75 5.41  HGB 11.5* 13.6 12.8*  HCT 36.4* 42.9 40.4  MCV 74.0* 74.6* 74.7*  MCH 23.4* 23.7* 23.7*  MCHC 31.6 31.7 31.7  RDW 15.7* 16.0* 15.9*  PLT 292 320 323    Cardiac Enzymes  Recent Labs Lab 01/28/17 2127 01/29/17 0530  TROPONINI <0.03 <0.03   No results for input(s): TROPIPOC in the last 168 hours.   BNP  Recent Labs Lab 01/28/17 2127  BNP 206.5*     DDimer No results for input(s): DDIMER in the last 168 hours.    Radiology    No results found.  Cardiac  Studies   TTE: 01/29/17  Study Conclusions  - Left ventricle: The cavity size was mildly dilated. Systolic   function was severely reduced. The estimated ejection fraction   was in the range of 20% to 25%. Wall motion was normal; there   were no regional wall motion abnormalities. - Left atrium: The atrium was mildly dilated.  Patient Profile     51 y.o. male PMH of IDDM, HTN and toe amputations who presented with progressive ongoing dyspnea. Found to have a reduced EF (25%).   Assessment & Plan    1. Acute systolic HF: Presented with progressive dyspnea and orthopnea. BNP mildly elevated, and CXR with edema and cardiomegaly. Echo this admission showed severely reduced EF of 20-25% no WMA. Denies any anginal symptoms prior to admission.   Cath showed moderate CAD.  Elevated right sided filling pressures.  He has been aggressively diuresed ( down 7.2 liters this admission )  Will change the lasix to 40 mg PO a day today  Will increase coreg to 6.25 mg PO BID  Continue lisinopril 20 mg a day   2. HTN: noted to be hypertensive on admission, but states generally controlled at home. Recommendations as above.   3. IDDM: CBGs uncontrolled in the out/inpatient setting. Hgb A1c 11.8 this admission.     Mertie Moores, MD  02/01/2017 9:19 AM    [address],  [place] [place], [place]  [address] Pager [phone] Phone: [phone]; Fax: [phone]

## 2017-02-01 NOTE — Progress Notes (Signed)
Patient transferred to room 3E15 after report given to [facility].  Patient informed of transfer and change in status to telemetry progressive care.  Transferred via wheelchair and escorted by nurse tech.

## 2017-02-02 ENCOUNTER — Telehealth: Payer: Self-pay

## 2017-02-02 LAB — BASIC METABOLIC PANEL
Anion gap: 5 (ref 5–15)
BUN: 18 mg/dL (ref 6–20)
CALCIUM: 9.2 mg/dL (ref 8.9–10.3)
CO2: 28 mmol/L (ref 22–32)
CREATININE: 1.13 mg/dL (ref 0.61–1.24)
Chloride: 103 mmol/L (ref 101–111)
GFR calc Af Amer: >60 mL/min (ref >60)
GLUCOSE: 90 mg/dL (ref 65–99)
Potassium: 4.4 mmol/L (ref 3.5–5.1)
Sodium: 136 mmol/L (ref 135–145)

## 2017-02-02 LAB — CBC
HEMATOCRIT: 40.1 % (ref 39.0–52.0)
Hemoglobin: 12.5 g/dL — ABNORMAL LOW (ref 13.0–17.0)
MCH: 23.3 pg — AB (ref 26.0–34.0)
MCHC: 31.2 g/dL (ref 30.0–36.0)
MCV: 74.8 fL — AB (ref 78.0–100.0)
PLATELETS: 317 10*3/uL (ref 150–400)
RBC: 5.36 MIL/uL (ref 4.22–5.81)
RDW: 15.8 % — AB (ref 11.5–15.5)
WBC: 4.6 10*3/uL (ref 4.0–10.5)

## 2017-02-02 LAB — GLUCOSE, CAPILLARY
Glucose-Capillary: 68 mg/dL (ref 65–99)
Glucose-Capillary: 96 mg/dL (ref 65–99)
Glucose-Capillary: 134 mg/dL — ABNORMAL HIGH (ref 65–99)

## 2017-02-02 MED ORDER — LISINOPRIL 20 MG PO TABS: 20.0000 mg | ORAL_TABLET | Freq: Every day | ORAL | Status: DC

## 2017-02-02 MED ORDER — LISINOPRIL 20 MG PO TABS
20.0000 mg | ORAL_TABLET | Freq: Every day | ORAL | 3 refills | Status: DC
Start: 2017-02-02 — End: 2017-02-15

## 2017-02-02 MED ORDER — ASPIRIN EC 81 MG PO TBEC: 81.0000 mg | DELAYED_RELEASE_TABLET | Freq: Every day | ORAL | 3 refills | Status: DC

## 2017-02-02 MED ORDER — CARVEDILOL 6.25 MG PO TABS: 6.2500 mg | ORAL_TABLET | Freq: Two times a day (BID) | ORAL | 1 refills | Status: DC

## 2017-02-02 MED ORDER — ATORVASTATIN CALCIUM 80 MG PO TABS: 80.0000 mg | ORAL_TABLET | Freq: Every day | ORAL | 3 refills | Status: DC

## 2017-02-02 MED ORDER — FUROSEMIDE 40 MG PO TABS: 40.0000 mg | ORAL_TABLET | Freq: Every day | ORAL | 1 refills | Status: DC

## 2017-02-02 MED ORDER — LOSARTAN POTASSIUM 50 MG PO TABS: 50.0000 mg | ORAL_TABLET | Freq: Every day | ORAL | Status: DC

## 2017-02-02 MED FILL — CARVEDILOL 6.25 MG TABLET: 6.25 | 30 days supply | Qty: 60 | Fill #0

## 2017-02-02 MED FILL — ATORVASTATIN 80 MG TABLET: 80 | 30 days supply | Qty: 30 | Fill #0

## 2017-02-02 MED FILL — ?LISINOPRIL 20 MG TABLET: 20 | 30 days supply | Qty: 30 | Fill #2

## 2017-02-02 MED FILL — ?FUROSEMIDE 40 MG TABLET: 40 | 30 days supply | Qty: 30 | Fill #0

## 2017-02-02 NOTE — Discharge Instructions (Signed)
Heart Failure °Heart failure is a condition in which the heart has trouble pumping blood because it has become weak or stiff. This means that the heart does not pump blood efficiently for the body to work well. For some people with heart failure, fluid may back up into the lungs and there may be swelling (edema) in the lower legs. Heart failure is usually a long-term (chronic) condition. It is important for you to take good care of yourself and follow the treatment plan from your health care provider. °What are the causes? °This condition is caused by some health problems, including: °· High blood pressure (hypertension). Hypertension causes the heart muscle to work harder than normal. High blood pressure eventually causes the heart to become stiff and weak. °· Coronary artery disease (CAD). CAD is the buildup of cholesterol and fat (plaques) in the arteries of the heart. °· Heart attack (myocardial infarction). Injured tissue, which is caused by the heart attack, does not contract as well and the heart's ability to pump blood is weakened. °· Abnormal heart valves. When the heart valves do not open and close properly, the heart muscle must pump harder to keep the blood flowing. °· Heart muscle disease (cardiomyopathy or myocarditis). Heart muscle disease is damage to the heart muscle from a variety of causes, such as drug or alcohol abuse, infections, or unknown causes. These can increase the risk of heart failure. °· Lung disease. When the lungs do not work properly, the heart must work harder. ° °What increases the risk? °Risk of heart failure increases as a person ages. This condition is also more likely to develop in people who: °· Are overweight. °· Are male. °· Smoke or chew tobacco. °· Abuse alcohol or illegal drugs. °· Have taken medicines that can damage the heart, such as chemotherapy drugs. °· Have diabetes. °? High blood sugar (glucose) is associated with high fat (lipid) levels in the blood. °? Diabetes  can also damage tiny blood vessels that carry nutrients to the heart muscle. °· Have abnormal heart rhythms. °· Have thyroid problems. °· Have low blood counts (anemia). ° °What are the signs or symptoms? °Symptoms of this condition include: °· Shortness of breath with activity, such as when climbing stairs. °· Persistent cough. °· Swelling of the feet, ankles, legs, or abdomen. °· Unexplained weight gain. °· Difficulty breathing when lying flat (orthopnea). °· Waking from sleep because of the need to sit up and get more air. °· Rapid heartbeat. °· Fatigue and loss of energy. °· Feeling light-headed, dizzy, or close to fainting. °· Loss of appetite. °· Nausea. °· Increased urination during the night (nocturia). °· Confusion. ° °How is this diagnosed? °This condition is diagnosed based on: °· Medical history, symptoms, and a physical exam. °· Diagnostic tests, which may include: °? Echocardiogram. °? Electrocardiogram (ECG). °? Chest X-ray. °? Blood tests. °? Exercise stress test. °? Radionuclide scans. °? Cardiac catheterization and angiogram. ° °How is this treated? °Treatment for this condition is aimed at managing the symptoms of heart failure. Medicines, behavioral changes, or other treatments may be necessary to treat heart failure. °Medicines °These may include: °· Angiotensin-converting enzyme (ACE) inhibitors. This type of medicine blocks the effects of a blood protein called angiotensin-converting enzyme. ACE inhibitors relax (dilate) the blood vessels and help to lower blood pressure. °· Angiotensin receptor blockers (ARBs). This type of medicine blocks the actions of a blood protein called angiotensin. ARBs dilate the blood vessels and help to lower blood pressure. °· Water   pills (diuretics). Diuretics cause the kidneys to remove salt and water from the blood. The extra fluid is removed through urination, leaving a lower volume of blood that the heart has to pump. °· Beta blockers. These improve heart  muscle strength and they prevent the heart from beating too quickly. °· Digoxin. This increases the force of the heartbeat. ° °Healthy behavior changes °These may include: °· Reaching and maintaining a healthy weight. °· Stopping smoking or chewing tobacco. °· Eating heart-healthy foods. °· Limiting or avoiding alcohol. °· Stopping use of street drugs (illegal drugs). °· Physical activity. ° °Other treatments °These may include: °· Surgery to open blocked coronary arteries or repair damaged heart valves. °· Placement of a biventricular pacemaker to improve heart muscle function (cardiac resynchronization therapy). This device paces both the right ventricle and left ventricle. °· Placement of a device to treat serious abnormal heart rhythms (implantable cardioverter defibrillator, or ICD). °· Placement of a device to improve the pumping ability of the heart (left ventricular assist device, or LVAD). °· Heart transplant. This can cure heart failure, and it is considered for certain patients who do not improve with other therapies. ° °Follow these instructions at home: °Medicines °· Take over-the-counter and prescription medicines only as told by your health care provider. Medicines are important in reducing the workload of your heart, slowing the progression of heart failure, and improving your symptoms. °? Do not stop taking your medicine unless your health care provider told you to do that. °? Do not skip any dose of medicine. °? Refill your prescriptions before you run out of medicine. You need your medicines every day. °Eating and drinking ° °· Eat heart-healthy foods. Talk with a dietitian to make an eating plan that is right for you. °? Choose foods that contain no trans fat and are low in saturated fat and cholesterol. Healthy choices include fresh or frozen fruits and vegetables, fish, lean meats, legumes, fat-free or low-fat dairy products, and whole-grain or high-fiber foods. °? Limit salt (sodium) if  directed by your health care provider. Sodium restriction may reduce symptoms of heart failure. Ask a dietitian to recommend heart-healthy seasonings. °? Use healthy cooking methods instead of frying. Healthy methods include roasting, grilling, broiling, baking, poaching, steaming, and stir-frying. °· Limit your fluid intake if directed by your health care provider. Fluid restriction may reduce symptoms of heart failure. °Lifestyle °· Stop smoking or using chewing tobacco. Nicotine and tobacco can damage your heart and your blood vessels. Do not use nicotine gum or patches before talking to your health care provider. °· Limit alcohol intake to no more than 1 drink per day for non-pregnant women and 2 drinks per day for men. One drink equals 12 oz of beer, 5 oz of wine, or 1½ oz of hard liquor. °? Drinking more than that is harmful to your heart. Tell your health care provider if you drink alcohol several times a week. °? Talk with your health care provider about whether any level of alcohol use is safe for you. °? If your heart has already been damaged by alcohol or you have severe heart failure, drinking alcohol should be stopped completely. °· Stop use of illegal drugs. °· Lose weight if directed by your health care provider. Weight loss may reduce symptoms of heart failure. °· Do moderate physical activity if directed by your health care provider. People who are elderly and people with severe heart failure should consult with a health care provider for physical activity recommendations. °  Monitor important information °· Weigh yourself every day. Keeping track of your weight daily helps you to notice excess fluid sooner. °? Weigh yourself every morning after you urinate and before you eat breakfast. °? Wear the same amount of clothing each time you weigh yourself. °? Record your daily weight. Provide your health care provider with your weight record. °· Monitor and record your blood pressure as told by your health  care provider. °· Check your pulse as told by your health care provider. °Dealing with extreme temperatures °· If the weather is extremely hot: °? Avoid vigorous physical activity. °? Use air conditioning or fans or seek a cooler location. °? Avoid caffeine and alcohol. °? Wear loose-fitting, lightweight, and light-colored clothing. °· If the weather is extremely cold: °? Avoid vigorous physical activity. °? Layer your clothes. °? Wear mittens or gloves, a hat, and a scarf when you go outside. °? Avoid alcohol. °General instructions °· Manage other health conditions such as hypertension, diabetes, thyroid disease, or abnormal heart rhythms as told by your health care provider. °· Learn to manage stress. If you need help to do this, ask your health care provider. °· Plan rest periods when fatigued. °· Get ongoing education and support as needed. °· Participate in or seek rehabilitation as needed to maintain or improve independence and quality of life. °· Stay up to date with immunizations. Keeping current on pneumococcal and influenza immunizations is especially important to prevent respiratory infections. °· Keep all follow-up visits as told by your health care provider. This is important. °Contact a health care provider if: °· You have a rapid weight gain. °· You have increasing shortness of breath that is unusual for you. °· You are unable to participate in your usual physical activities. °· You tire easily. °· You cough more than normal, especially with physical activity. °· You have any swelling or more swelling in areas such as your hands, feet, ankles, or abdomen. °· You are unable to sleep because it is hard to breathe. °· You feel like your heart is beating quickly (palpitations). °· You become dizzy or light-headed when you stand up. °Get help right away if: °· You have difficulty breathing. °· You notice or your family notices a change in your awareness, such as having trouble staying awake or having  difficulty with concentration. °· You have pain or discomfort in your chest. °· You have an episode of fainting (syncope). °This information is not intended to replace advice given to you by your health care provider. Make sure you discuss any questions you have with your health care provider. °Document Released: 11/28/2005 Document Revised: 08/02/2016 Document Reviewed: 06/22/2016 °Elsevier Interactive Patient Education © 2017 [employer] ° °

## 2017-02-02 NOTE — Progress Notes (Signed)
Pt has orders to be discharged. Discharge instructions given and pt has no additional questions at this time. Medication regimen reviewed and pt educated. Pt verbalized understanding and has no additional questions. Telemetry box removed. IV removed and site in good condition. Pt stable and waiting for transportation.   Keyuna Cuthrell RN 

## 2017-02-02 NOTE — Care Management Note (Signed)
Case Management Note  Patient Details  Name: Jeffrey [name] MRN: [id number] Date of Birth: [birth year]  Subjective/Objective:       Admitted with CHF             Action/Plan: Patient lives with his sister; has a supportive girlfriend; he is active with [facility] and he gets his prescriptions filled there also; he does not want any HHC or a rolling walker; girlfriend stated that he has one at home; the [employer] is working with the patent in getting his [employer] application;  Expected Discharge Date:  02/01/17               Expected Discharge Plan:  Home/Self Care  Discharge planning Services  CM Consult, [facility]   [facility] Arranged:  Patient Refused  Status of Service:  In process, will continue to follow  Sherrilyn Rist [phone] 02/02/2017, 11:42 AM

## 2017-02-02 NOTE — Telephone Encounter (Signed)
-----   Message from Eileen Stanford, PA-C sent at 02/02/2017 10:55 AM EST ----- Needs TOC call for apt with vin bhagat

## 2017-02-02 NOTE — Progress Notes (Signed)
Patient Name: Jeffrey [name] Date of Encounter: 02/02/2017  Primary Cardiologist: Dr. Antonieta Loveless Problem List     Principal Problem:   Acute CHF (congestive heart failure) [facility]) Active Problems:   Essential hypertension   Uncontrolled type 2 diabetes mellitus with hyperglycemia, with long-term current use of insulin (HCC)   Anemia, iron deficiency     Subjective    Feeling much better. Breathing back to baseline. Does not want to leave the hospital- likes it here, but understands it is not medically necessary to keep him here.  Inpatient Medications    Scheduled Meds: . aspirin  81 mg Oral Daily  . atorvastatin  80 mg Oral q1800  . carvedilol  6.25 mg Oral BID WC  . enoxaparin (LOVENOX) injection  40 mg Subcutaneous Q24H  . ferrous sulfate  325 mg Oral BID WC  . furosemide  40 mg Oral Daily  . gabapentin  400 mg Oral TID  . Influenza vac split quadrivalent PF  0.5 mL Intramuscular Tomorrow-1000  . insulin aspart  0-15 Units Subcutaneous TID WC  . insulin aspart  0-5 Units Subcutaneous QHS  . insulin glargine  55 Units Subcutaneous Q2200  . lisinopril  20 mg Oral Daily  . nitroGLYCERIN  0.5 inch Topical Q8H  . sodium chloride flush  3 mL Intravenous Q12H  . sodium chloride flush  3 mL Intravenous Q12H   Continuous Infusions:  PRN Meds: acetaminophen, benzonatate, hydrALAZINE, ondansetron (ZOFRAN) IV, sodium chloride flush   Vital Signs    Vitals:   02/01/17 1804 02/01/17 1956 02/02/17 0535 02/02/17 0745  BP: 101/66 112/68 110/73 110/75  Pulse: 88 85 84 80  Resp: 18 16 16 18   Temp: 97.8 F (36.6 C) 99 F (37.2 C) 98.3 F (36.8 C) 98.8 F (37.1 C)  TempSrc: Oral Oral Oral Oral  SpO2: 98% 97% 99% 100%  Weight:   192 lb 7.4 oz (87.3 kg)   Height:        Intake/Output Summary (Last 24 hours) at 02/02/17 1035 Last data filed at 02/01/17 1920  Gross per 24 hour  Intake              560 ml  Output              200 ml  Net              360 ml    Filed Weights   02/01/17 0500 02/01/17 1443 02/02/17 0535  Weight: 193 lb 9 oz (87.8 kg) 190 lb 12.8 oz (86.5 kg) 192 lb 7.4 oz (87.3 kg)    Physical Exam    GEN: Well nourished, well developed, in no acute distress.  HEENT: Grossly normal.  Neck: Supple, no JVD, carotid bruits, or masses. Cardiac: RRR, no murmurs, rubs, or gallops. No clubbing, cyanosis, edema.  Radials/DP/PT 2+ and equal bilaterally.  Respiratory:  Respirations regular and unlabored, clear to auscultation bilaterally. GI: Soft, nontender, nondistended, BS + x 4. MS: no deformity or atrophy. Skin: warm and dry, no rash. Neuro:  Strength and sensation are intact. Psych: AAOx3.  Normal affect.  Labs    CBC  Recent Labs  02/01/17 0259 02/02/17 0440  WBC 5.9 4.6  HGB 12.8* 12.5*  HCT 40.4 40.1  MCV 74.7* 74.8*  PLT 323 132   Basic Metabolic Panel  Recent Labs  02/01/17 0259 02/02/17 0440  NA 134* 136  K 4.0 4.4  CL 102 103  CO2 25 28  GLUCOSE 161*  90  BUN 12 18  CREATININE 1.04 1.13  CALCIUM 8.8* 9.2   Liver Function Tests No results for input(s): AST, ALT, ALKPHOS, BILITOT, PROT, ALBUMIN in the last 72 hours. No results for input(s): LIPASE, AMYLASE in the last 72 hours. Cardiac Enzymes No results for input(s): CKTOTAL, CKMB, CKMBINDEX, TROPONINI in the last 72 hours. BNP Invalid input(s): POCBNP D-Dimer No results for input(s): DDIMER in the last 72 hours. Hemoglobin A1C No results for input(s): HGBA1C in the last 72 hours. Fasting Lipid Panel  Recent Labs  02/01/17 0259  CHOL 207*  HDL 42  LDLCALC 123*  TRIG 208*  CHOLHDL 4.9   Thyroid Function Tests No results for input(s): TSH, T4TOTAL, T3FREE, THYROIDAB in the last 72 hours.  Invalid input(s): FREET3  Telemetry    Sinus and sinus tach - Personally Reviewed  ECG    Sinus HR 93 - Personally Reviewed  Radiology    No results found.  Cardiac Studies   01/31/17 Procedures Right/Left Heart Cath and Coronary  Angiography  Conclusion    1st RPLB lesion, 60 %stenosed.  Dist RCA lesion, 60 %stenosed.  Prox RCA lesion, 55 %stenosed.  Prox LAD lesion, 10 %stenosed.  There is severe left ventricular systolic dysfunction.  LV end diastolic pressure is mildly elevated.   Severe global LV dysfunction with an ejection fraction of 10-15%.  The pattern is one of a nonischemic cardiomyopathy.  Mild coronary obstructive disease with smooth 10% narrowing in the LAD; normal ramus intermediate, normal left circumflex; and RCA with 50-60% proximal stenosis and distal tapering of 60%.  Prior to giving rise to 3 small distal branches with 60% narrowing in a small inferior LV branch.  The patient's LV dysfunction is out of proportion to his CAD.  RECOMMENDATION: With the patient's severe LV dysfunction, it is recommended that the patient be transferred to 4 N stepdown unit rather than return back to [facility].  Initiation of medical therapy with carvedilol, spironolactone, and probable initiation of angiotension receptorblocker/neprilysin inhibition therapy.  Consider short-term life-vest prior to discharge to allow for possible medication induced improvement in LV function.     TTE: 01/29/17 Study Conclusions - Left ventricle: The cavity size was mildly dilated. Systolic function was severely reduced. The estimated ejection fraction was in the range of 20% to 25%. Wall motion was normal; there were no regional wall motion abnormalities. - Left atrium: The atrium was mildly dilated.  Patient Profile     51 y.o. [place] of IDDM, HTN and toe amputations who presented with progressive ongoing dyspnea. Found to have a reduced EF (25%).    Assessment & Plan      1. Acute systolic PQ:DIYMEBRAX with progressive dyspnea and orthopnea. BNP mildly elevated, and CXR with edema and cardiomegaly. Echo this admission showed severely reduced EF of 20-25% no WMA. EF 10-15% by cath. Denies any anginal  symptoms prior to admission. He underwent L/RHC on 01/31/17 which showed moderate CAD and elevated right sided filling pressures.  -- He has been aggressively diuresed. Net neg 6.9L this admission. Weight down 3 lbs (195--> 192lbs ? Accuracy of this). Doing well on PO lasix to 40 mg daily, Coreg 6.25 mg PO BID and lisinopril 20 mg a day. He would be a good Entresto candidate, but he does not have insurance so I am not sure he would be able to afford it.  -- LifeVest felt not to be indicated as there is not very good data to support its use in NICM.  --  I will arrange for 7 day follow up in the office.   2. UTM:LYYTK to be hypertensive on admission, but states generally controlled at home. Recommendations as above.   3. IDDM:CBGs uncontrolled in the out/inpatient setting. Hgb A1c 11.8 this admission.   Signed, Angelena Form, PA-C  02/02/2017, 10:35 AM   Attending Note:   The patient was seen and examined.  Agree with assessment and plan as noted above.  Changes made to the above note as needed.  Patient seen and independently examined with Nell Range, PA .   We discussed all aspects of the encounter. I agree with the assessment and plan as stated above.  1. Acute on chronic systolic CHF:   He has been started on CHF meds.  Seems to be improving  Breathing better.   Stable for DC today    I have spent a total of 30 minutes with patient reviewing hospital  notes , telemetry, EKGs, labs and examining patient as well as establishing an assessment and plan that was discussed with the patient. > 50% of time was spent in direct patient care.    Thayer Headings, Brooke Bonito., MD, [facility] 02/02/2017, 5:41 PM [address]  [place] Pager [phone]

## 2017-02-02 NOTE — Progress Notes (Signed)
Patient now stated that he does not have rolling walker at home and is requesting one at discharge; Rolling walker ordered as requested; Aneta Mins [phone]

## 2017-02-02 NOTE — Discharge Summary (Signed)
[facility]  Physician Discharge Summary   Patient ID: DURIEL DEERY MRN: [id number] DOB/AGE: [birth year] 52 y.o.  Admit date: 01/28/2017 Discharge date: 02/02/2017  PCP: Arnoldo Morale, MD  DISCHARGE DIAGNOSES:  Principal Problem:   Acute CHF (congestive heart failure) ([place]) Active Problems:   Essential hypertension   Uncontrolled type 2 diabetes mellitus with hyperglycemia, with long-term current use of insulin (HCC)   Anemia, iron deficiency   RECOMMENDATIONS FOR OUTPATIENT FOLLOW UP: 1. Needs outpatient workup for iron deficiency anemia, if he hasn't had previously 2. Outpatient follow-up with cardiology.  DISCHARGE CONDITION: fair  Diet recommendation: Modified carbohydrate  Filed Weights   02/01/17 0500 02/01/17 1443 02/02/17 0535  Weight: 87.8 kg (193 lb 9 oz) 86.5 kg (190 lb 12.8 oz) 87.3 kg (192 lb 7.4 oz)    INITIAL HISTORY: 51 year old African-American male with a past medical history of insulin-dependent diabetes with neuropathy and history of toe amputations, history of hypertension, presented with one-week history of shortness of breath with exertion. Patient was found to be volume overloaded. Echocardiogram showed EF of 20-25%. Cardiology was consulted.  Consultants: Cardiology  Procedures:  Transthoracic echocardiogram Study Conclusions  - Left ventricle: The cavity size was mildly dilated. Systolic function was severely reduced. The estimated ejection fraction was in the range of 20% to 25%. Wall motion was normal; there were no regional wall motion abnormalities. - Left atrium: The atrium was mildly dilated.  Cardiac catheterization Conclusion     1st RPLB lesion, 60 %stenosed.  Dist RCA lesion, 60 %stenosed.  Prox RCA lesion, 55 %stenosed.  Prox LAD lesion, 10 %stenosed.  There is severe left ventricular systolic dysfunction.  LV end diastolic pressure is mildly elevated.  Severe global LV dysfunction with an  ejection fraction of 10-15%. The pattern is one of a nonischemic cardiomyopathy.  Mild coronary obstructive disease with smooth 10% narrowing in the LAD; normal ramus intermediate, normal left circumflex; and RCA with 50-60% proximal stenosis and distal tapering of 60%. Prior to giving rise to 3 small distal branches with 60% narrowing in a small inferior LV branch. The patient's LV dysfunction is out of proportion to his CAD.      HOSPITAL COURSE:   New onset acute systolic CHF Patient was admitted to the hospital and was started on intravenous diuretics. 2d echo with EF 20-25% with no wall motion abnormality. TSH normal limits. Patient seen by cardiology. ACE inhibitor was continued. Patient underwent cardiac catheterization: Left and right. No significant CAD was noted. LV dysfunction was thought to be out of proportion to the CAD noted on cardiac catheterization. Medical management is being pursued. Patient started on carvedilol. LDL is 123. Started on a statin. Diuretics changed over to oral. Discussed with cardiology today. Okay for discharge. They will arrange outpatient follow-up. Patient emphasized the importance of being compliant with his medications.  Insulin-dependent diabetes Hyperglycemic at admission. HbA1c 11.8, implying very poor control. Patient denies noncompliance. Patient will follow up with his primary care provider for further management.  Essential hypertension: Hypertensive at admission. All of his home medications were resumed. He is also been started on diuretics as well as beta blocker. Blood pressure is now reasonably well-controlled.  Microcytic anemia, presumed iron deficiency: Iron low at 33. Ferritin 28. He will need outpatient evaluation once his CHF is optimized. Recommend outpatient colonoscopy. He needs to discuss this with his PCP. Does not have any overt bleeding.   Hypokalemia Corrected.   Overall improved. Ambulated. He declines home  health services. Okay  for discharge.   PERTINENT LABS:  The results of significant diagnostics from this hospitalization (including imaging, microbiology, ancillary and laboratory) are listed below for reference.    Microbiology: Recent Results (from the past 240 hour(s))  MRSA PCR Screening     Status: None   Collection Time: 01/31/17  4:57 PM  Result Value Ref Range Status   MRSA by PCR NEGATIVE NEGATIVE Final    Comment:        The GeneXpert MRSA Assay (FDA approved for NASAL specimens only), is one component of a comprehensive MRSA colonization surveillance program. It is not intended to diagnose MRSA infection nor to guide or monitor treatment for MRSA infections.      Labs: Basic Metabolic Panel:  Recent Labs Lab 01/29/17 0530 01/30/17 0524 01/31/17 0507 01/31/17 1822 02/01/17 0259 02/02/17 0440  NA 141 137 139  --  134* 136  K 3.0* 3.1* 3.9  --  4.0 4.4  CL 108 107 106  --  102 103  CO2 25 24 28   --  25 28  GLUCOSE 124* 95 149*  --  161* 90  BUN 9 14 17   --  12 18  CREATININE 0.87 1.01 1.09 0.90 1.04 1.13  CALCIUM 9.0 8.8* 9.1  --  8.8* 9.2  MG 1.5* 1.8  --   --   --   --    Liver Function Tests:  Recent Labs Lab 01/28/17 2127  AST 16  ALT 14*  ALKPHOS 74  BILITOT 0.5  PROT 7.3  ALBUMIN 3.2*   CBC:  Recent Labs Lab 01/28/17 2127 01/31/17 1822 02/01/17 0259 02/02/17 0440  WBC 4.7 5.3 5.9 4.6  NEUTROABS 3.1  --   --   --   HGB 11.5* 13.6 12.8* 12.5*  HCT 36.4* 42.9 40.4 40.1  MCV 74.0* 74.6* 74.7* 74.8*  PLT 292 320 323 317   Cardiac Enzymes:  Recent Labs Lab 01/28/17 2127 01/29/17 0530  TROPONINI <0.03 <0.03   BNP: BNP (last 3 results)  Recent Labs  06/24/16 1615 11/15/16 1732 01/28/17 2127  BNP 99.6 80.1 206.5*    CBG:  Recent Labs Lab 02/01/17 1115 02/01/17 1625 02/01/17 2147 02/02/17 0743 02/02/17 0852  GLUCAP 219* 160* 124* 68 96     IMAGING STUDIES Dg Chest 2 View  Result Date: 01/28/2017 CLINICAL  DATA:  Patient with cough and chills.  Shortness of breath. EXAM: CHEST  2 VIEW COMPARISON:  Chest radiograph 11/15/2016 FINDINGS: Cardiomegaly. Tortuosity of the thoracic aorta. Pulmonary vascular redistribution and interstitial opacities bilaterally. Thoracic spine degenerative changes. No pleural effusion or pneumothorax. IMPRESSION: Cardiomegaly and mild interstitial edema. Electronically Signed   By: Lovey Newcomer M.D.   On: 01/28/2017 20:30    DISCHARGE EXAMINATION: Vitals:   02/01/17 1804 02/01/17 1956 02/02/17 0535 02/02/17 0745  BP: 101/66 112/68 110/73 110/75  Pulse: 88 85 84 80  Resp: 18 16 16 18   Temp: 97.8 F (36.6 C) 99 F (37.2 C) 98.3 F (36.8 C) 98.8 F (37.1 C)  TempSrc: Oral Oral Oral Oral  SpO2: 98% 97% 99% 100%  Weight:   87.3 kg (192 lb 7.4 oz)   Height:       General appearance: alert, cooperative, appears stated age and no distress Resp: clear to auscultation bilaterally Cardio: regular rate and rhythm, S1, S2 normal, no murmur, click, rub or gallop GI: soft, non-tender; bowel sounds normal; no masses,  no organomegaly  DISPOSITION: Home  Discharge Instructions    Call MD for:  difficulty breathing, headache or visual disturbances    Complete by:  As directed    Call MD for:  extreme fatigue    Complete by:  As directed    Call MD for:  persistant dizziness or light-headedness    Complete by:  As directed    Call MD for:  persistant nausea and vomiting    Complete by:  As directed    Call MD for:  severe uncontrolled pain    Complete by:  As directed    Call MD for:  temperature >100.4    Complete by:  As directed    Diet - low sodium heart healthy    Complete by:  As directed    Diet Carb Modified    Complete by:  As directed    Discharge instructions    Complete by:  As directed    Please be sure to keep your appointments. He will need to discuss with your primary care provider regarding iron deficiency anemia and further workup for the  same.  You were cared for by a hospitalist during your hospital stay. If you have any questions about your discharge medications or the care you received while you were in the hospital after you are discharged, you can call the unit and asked to speak with the hospitalist on call if the hospitalist that took care of you is not available. Once you are discharged, your primary care physician will handle any further medical issues. Please note that NO REFILLS for any discharge medications will be authorized once you are discharged, as it is imperative that you return to your primary care physician (or establish a relationship with a primary care physician if you do not have one) for your aftercare needs so that they can reassess your need for medications and monitor your lab values. If you do not have a primary care physician, you can call [phone] for a physician referral.   Increase activity slowly    Complete by:  As directed       ALLERGIES: No Known Allergies   Current Discharge Medication List    START taking these medications   Details  aspirin EC 81 MG tablet Take 1 tablet (81 mg total) by mouth daily. Qty: 30 tablet, Refills: 3    atorvastatin (LIPITOR) 80 MG tablet Take 1 tablet (80 mg total) by mouth daily at 6 PM. Qty: 30 tablet, Refills: 3    carvedilol (COREG) 6.25 MG tablet Take 1 tablet (6.25 mg total) by mouth 2 (two) times daily with a meal. Qty: 60 tablet, Refills: 1    furosemide (LASIX) 40 MG tablet Take 1 tablet (40 mg total) by mouth daily. Qty: 30 tablet, Refills: 1      CONTINUE these medications which have CHANGED   Details  lisinopril (PRINIVIL,ZESTRIL) 20 MG tablet Take 1 tablet (20 mg total) by mouth daily. Qty: 30 tablet, Refills: 3   Associated Diagnoses: Essential hypertension      CONTINUE these medications which have NOT CHANGED   Details  albuterol (PROVENTIL HFA;VENTOLIN HFA) 108 (90 Base) MCG/ACT inhaler Inhale 1-2 puffs into the lungs every 6  (six) hours as needed for wheezing or shortness of breath. Qty: 1 Inhaler, Refills: 0    butalbital-acetaminophen-caffeine (ESGIC) 50-325-40 MG tablet Take 1 tablet by mouth every 6 (six) hours as needed for headache. Qty: 30 tablet, Refills: 0   Associated Diagnoses: Headache, unspecified headache type    ferrous sulfate 325 (65 FE) MG tablet Take  1 tablet (325 mg total) by mouth 2 (two) times daily with a meal. Qty: 60 tablet, Refills: 3    gabapentin (NEURONTIN) 400 MG capsule Take 1 capsule (400 mg total) by mouth 3 (three) times daily. Qty: 90 capsule, Refills: 3   Associated Diagnoses: Diabetic polyneuropathy associated with type 2 diabetes mellitus (HCC)    insulin aspart (NOVOLOG) 100 UNIT/ML injection Inject 0-15 Units into the skin 3 (three) times daily with meals. Sliding scale  CBG 70 - 120: 0 units: CBG 121 - 140: 2 units; CBG 140 - 200: 4 units; CBG 201 - 240: 6 units; CBG 240 - 300: 8 units;CBG 300 - 350: 12 units; CBG 351 - 400: 16 units; CBG > 400 : 16 units and notify your  MD Qty: 10 mL, Refills: 3   Associated Diagnoses: Uncontrolled type 2 diabetes mellitus with hyperglycemia, with long-term current use of insulin (HCC)    Insulin Glargine (LANTUS SOLOSTAR) 100 UNIT/ML Solostar Pen Inject 55 Units into the skin daily at 10 pm. Qty: 5 pen, Refills: 3   Associated Diagnoses: Uncontrolled type 2 diabetes mellitus with hyperglycemia, with long-term current use of insulin (HCC)    lactulose (CHRONULAC) 10 GM/15ML solution Take 15 mLs (10 g total) by mouth 2 (two) times daily as needed for mild constipation. Qty: 946 mL, Refills: 1   Associated Diagnoses: Other constipation      STOP taking these medications     clindamycin (CLEOCIN) 300 MG capsule      ibuprofen (ADVIL,MOTRIN) 400 MG tablet          Follow-up Information    [place] Go on 02/08/2017.   Why:  at 9:45am for an appointment in the [facility] with Dr  Jarold Song. Please bring all of your medications with you to the appointment.  Contact information: [place] [phone] [place], [place] Follow up on 02/07/2017.   Specialty:  Cardiology Why:  @3pm  (please arrive at least 10 minutes early) Contact information: [address] [place] [address] [phone]           TOTAL DISCHARGE TIME: 35 minutes  [facility] Pager [phone]  02/02/2017, 11:44 AM

## 2017-02-02 NOTE — Evaluation (Signed)
Physical Therapy Evaluation Patient Details Name: Jeffrey [name] MRN: [id number] DOB: 02/05/1966 Today's Date: 02/02/2017   History of Present Illness  51 yo male with onset of CHF with heart cath was admitted, has EF 20-25%. Has LE edema, SOB.  PMHx: DM, HTN, R toe amputations, modified shoe  Clinical Impression  Pt is encouraging a longer walk to PT and discussed the need to wait due to his low BS readings, and nursing in to assure him that walk will occur with them once sugars are more stable.  Pt finally began to eat his sandwich and nursing called the kitchen for him to get a tray of food for breakfast that he wants.  Follow acutely for strengthening and to increase his tolerance for safe gait, and may downgrade AD as is safe.    Follow Up Recommendations Home health PT;Supervision - Intermittent    Equipment Recommendations  Rolling walker with 5" wheels (if his is not in good shape)    Recommendations for Other Services       Precautions / Restrictions Precautions Precautions: Fall (nursing reporting BS of 60 and urgent need to replenish) Required Braces or Orthoses: Other Brace/Splint (modified R shoe which pt did not bring with him) Restrictions Weight Bearing Restrictions: No      Mobility  Bed Mobility Overal bed mobility: Modified Independent                Transfers Overall transfer level: Modified independent Equipment used: Rolling walker (2 wheeled)             General transfer comment: has been using RW or SPC at home  Ambulation/Gait Ambulation/Gait assistance: Min guard (due to reduced BS) Ambulation Distance (Feet): 25 Feet Assistive device: Rolling walker (2 wheeled);1 person hand held assist Gait Pattern/deviations: Step-through pattern;Decreased stride length;Narrow base of support Gait velocity: reduced Gait velocity interpretation: Below normal speed for age/gender General Gait Details: maintained a slow pace with care around room  obstacles  Stairs            Wheelchair Mobility    Modified Rankin (Stroke Patients Only)       Balance                                             Pertinent Vitals/Pain Pain Assessment: No/denies pain    Home Living Family/patient expects to be discharged to:: Private residence Living Arrangements: Spouse/significant other Available Help at Discharge: Family;Available 24 hours/day Type of Home: Apartment Home Access: Level entry     Home Layout: One level Home Equipment: Grab bars - toilet;Grab bars - tub/shower;Cane - single point;Walker - 2 wheels;Crutches      Prior Function Level of Independence: Independent with assistive device(s)               Hand Dominance        Extremity/Trunk Assessment                Communication   Communication: No difficulties  Cognition Arousal/Alertness: Awake/alert Behavior During Therapy: WFL for tasks assessed/performed Overall Cognitive Status: Within Functional Limits for tasks assessed                      General Comments General comments (skin integrity, edema, etc.): Pt is given OJ by nursing to replenish blood sugar, encouraged to eat a sandwich which he  did not initially eat when nursing came to him about the blood sugar    Exercises     Assessment/Plan    PT Assessment    PT Problem List         PT Treatment Interventions      PT Goals (Current goals can be found in the Care Plan section)  Acute Rehab PT Goals Patient Stated Goal: go down the hall PT Goal Formulation: With patient Time For Goal Achievement: 02/16/17 Potential to Achieve Goals: Good    Frequency Min 3X/week   Barriers to discharge        Co-evaluation               End of Session Equipment Utilized During Treatment: Gait belt Activity Tolerance: Patient tolerated treatment well;Treatment limited secondary to medical complications (Comment) (low blood sugar) Patient left: in  chair;with call bell/phone within reach;with chair alarm set;with nursing/sitter in room Nurse Communication: Mobility status PT Visit Diagnosis: Unsteadiness on feet (R26.81)         Time: 7412-8786 PT Time Calculation (min) (ACUTE ONLY): 20 min   Charges:   PT Evaluation $PT Eval Moderate Complexity: 1 Procedure     PT G Codes:         Ramond Dial 2017/02/06, 10:27 AM   Mee Hives, PT MS Acute Rehab Dept. Number: [place] [facility] [place]

## 2017-02-03 NOTE — Telephone Encounter (Signed)
Left message for pt to call.

## 2017-02-06 ENCOUNTER — Telehealth: Payer: Self-pay

## 2017-02-06 NOTE — Telephone Encounter (Signed)
[facility] Post-discharge Follow-Up Phone Call:  Date of Discharge:  02/02/2017 Principal Discharge Diagnosis(es): acute CHF, HTN, uncontrolled DM - type 2 Post-discharge Communication: (Clearly document all attempts clearly and date contact made)  Call placed to # [phone] and a HIPAA compliant voicemail message was left requesting a call back to # [phone] or [phone]. Call also placed to the patient's friend, Claris Pong # [phone] and a HIPAA compliant voicemail message was left requesting a call back to # [phone] or [phone]. The patient had given this CM [name] Robinson's phone # as a contact  Call Completed: no

## 2017-02-06 NOTE — Telephone Encounter (Signed)
Left message on machine for pt to contact the office.  The pt has a pending appointment scheduled on 02/07/17.

## 2017-02-07 ENCOUNTER — Encounter: Payer: Self-pay | Admitting: Physician Assistant

## 2017-02-07 NOTE — Progress Notes (Deleted)
Cardiology Office Note    Date:  02/07/2017   ID:  REINHOLD RICKEY, DOB 17-Feb-1966, MRN [id number]  PCP:  Arnoldo Morale, MD  Cardiologist:  Dr. Acie Fredrickson  Chief Complaint: Hospital follow up for CHF  History of Present Illness:   QUINN QUAM is a 51 y.o. male DM, HTN and toe amputations and recent diagnosis of CHF and NICM presents for hospital follow up.   He presented 01/28/17 to [facility] progressive dyspnea and orthopnea. BNP mildly elevated, and CXR with edema and cardiomegaly. Echo showed severely reduced EF of 20-25% no WMA. EF 10-15% by cath. Denies any anginal symptoms prior to admission. He underwent L/RHC on 01/31/17 which showed moderate CAD and elevated right sided filling pressures.  He diuresed  neg 6.9L. Weight down 3 lbs (195--> 192lbs ? Accuracy of this). Discharged on PO lasix to 40 mg daily, Coreg 6.25 mg PO BID and lisinopril 20 mg a day. He would be a good Entresto candidate, but he does not have insurance. Will not start currently.  LifeVest felt not to be indicated as there is not very good data to support its use in NICM.     Past Medical History:  Diagnosis Date  . Diabetes mellitus   . Diabetic foot infection ([place]) 03/2016   RT FOOT  . HTN (hypertension)     Past Surgical History:  Procedure Laterality Date  . AMPUTATION Right 04/01/2016   Procedure: Right Great Toe Amputation;  Surgeon: Newt Minion, MD;  Location: [facility];  Service: Orthopedics;  Laterality: Right;  . AMPUTATION Right 06/19/2016   Procedure: AMPUTATION SECOND TOE;  Surgeon: Marybelle Killings, MD;  Location: [facility];  Service: Orthopedics;  Laterality: Right;  . BACK SURGERY     for abscess  . RIGHT/LEFT HEART CATH AND CORONARY ANGIOGRAPHY N/A 01/31/2017   Procedure: Right/Left Heart Cath and Coronary Angiography;  Surgeon: Troy Sine, MD;  Location: [facility];  Service: Cardiovascular;  Laterality: N/A;    Current Medications: Prior to Admission medications   Medication Sig Start  Date End Date Taking? Authorizing Provider  albuterol (PROVENTIL HFA;VENTOLIN HFA) 108 (90 Base) MCG/ACT inhaler Inhale 1-2 puffs into the lungs every 6 (six) hours as needed for wheezing or shortness of breath. 11/15/16   Recardo Evangelist, PA-C  aspirin EC 81 MG tablet Take 1 tablet (81 mg total) by mouth daily. 02/02/17   Bonnielee Haff, MD  atorvastatin (LIPITOR) 80 MG tablet Take 1 tablet (80 mg total) by mouth daily at 6 PM. 02/02/17   Bonnielee Haff, MD  butalbital-acetaminophen-caffeine (ESGIC) 50-325-40 MG tablet Take 1 tablet by mouth every 6 (six) hours as needed for headache. 04/13/16   Arnoldo Morale, MD  carvedilol (COREG) 6.25 MG tablet Take 1 tablet (6.25 mg total) by mouth 2 (two) times daily with a meal. 02/02/17   Bonnielee Haff, MD  ferrous sulfate 325 (65 FE) MG tablet Take 1 tablet (325 mg total) by mouth 2 (two) times daily with a meal. 04/03/16   Nishant Dhungel, MD  furosemide (LASIX) 40 MG tablet Take 1 tablet (40 mg total) by mouth daily. 02/03/17   Bonnielee Haff, MD  gabapentin (NEURONTIN) 400 MG capsule Take 1 capsule (400 mg total) by mouth 3 (three) times daily. 09/28/16   Arnoldo Morale, MD  insulin aspart (NOVOLOG) 100 UNIT/ML injection Inject 0-15 Units into the skin 3 (three) times daily with meals. Sliding scale  CBG 70 - 120: 0 units: CBG 121 -  140: 2 units; CBG 140 - 200: 4 units; CBG 201 - 240: 6 units; CBG 240 - 300: 8 units;CBG 300 - 350: 12 units; CBG 351 - 400: 16 units; CBG > 400 : 16 units and notify your  MD 09/28/16   Arnoldo Morale, MD  Insulin Glargine (LANTUS SOLOSTAR) 100 UNIT/ML Solostar Pen Inject 55 Units into the skin daily at 10 pm. 09/28/16   Arnoldo Morale, MD  lactulose (CHRONULAC) 10 GM/15ML solution Take 15 mLs (10 g total) by mouth 2 (two) times daily as needed for mild constipation. 02/24/16   Arnoldo Morale, MD  lisinopril (PRINIVIL,ZESTRIL) 20 MG tablet Take 1 tablet (20 mg total) by mouth daily. 02/02/17   Bonnielee Haff, MD    Allergies:   Patient has no  known allergies.   Social History   Social History  . Marital status: Significant Other    Spouse name: N/A  . Number of children: N/A  . Years of education: N/A   Social History Main Topics  . Smoking status: Never Smoker  . Smokeless tobacco: Never Used  . Alcohol use No  . Drug use: No  . Sexual activity: Not on file   Other Topics Concern  . Not on file   Social History Narrative  . No narrative on file     Family History:  The patient's family history includes Diabetes in his father, mother, and sister; Heart attack in his father and maternal grandmother; Hypertension in his mother. ***  ROS:   Please see the history of present illness.    ROS All other systems reviewed and are negative.   PHYSICAL EXAM:   VS:  There were no vitals taken for this visit.   GEN: Well nourished, well developed, in no acute distress  HEENT: normal  Neck: no JVD, carotid bruits, or masses Cardiac: ***RRR; no murmurs, rubs, or gallops,no edema  Respiratory:  clear to auscultation bilaterally, normal work of breathing GI: soft, nontender, nondistended, + BS MS: no deformity or atrophy  Skin: warm and dry, no rash Neuro:  Alert and Oriented x 3, Strength and sensation are intact Psych: euthymic mood, full affect  Wt Readings from Last 3 Encounters:  02/02/17 192 lb 7.4 oz (87.3 kg)  11/15/16 199 lb (90.3 kg)  10/25/16 199 lb (90.3 kg)      Studies/Labs Reviewed:   EKG:  EKG is ordered today.  The ekg ordered today demonstrates ***  Recent Labs: 01/28/2017: ALT 14; B Natriuretic Peptide 206.5 01/29/2017: TSH 0.847 01/30/2017: Magnesium 1.8 02/02/2017: BUN 18; Creatinine, Ser 1.13; Hemoglobin 12.5; Platelets 317; Potassium 4.4; Sodium 136   Lipid Panel    Component Value Date/Time   CHOL 207 (H) 02/01/2017 0259   TRIG 208 (H) 02/01/2017 0259   HDL 42 02/01/2017 0259   CHOLHDL 4.9 02/01/2017 0259   VLDL 42 (H) 02/01/2017 0259   LDLCALC 123 (H) 02/01/2017 0259     Additional studies/ records that were reviewed today include:   01/31/17 Procedures Right/Left Heart Cath and Coronary Angiography  Conclusion    1st RPLB lesion, 60 %stenosed.  Dist RCA lesion, 60 %stenosed.  Prox RCA lesion, 55 %stenosed.  Prox LAD lesion, 10 %stenosed.  There is severe left ventricular systolic dysfunction.  LV end diastolic pressure is mildly elevated.  Severe global LV dysfunction with an ejection fraction of 10-15%. The pattern is one of a nonischemic cardiomyopathy.  Mild coronary obstructive disease with smooth 10% narrowing in the LAD; normal ramus intermediate, normal  left circumflex; and RCA with 50-60% proximal stenosis and distal tapering of 60%. Prior to giving rise to 3 small distal branches with 60% narrowing in a small inferior LV branch. The patient's LV dysfunction is out of proportion to his CAD.  RECOMMENDATION: With the patient's severe LV dysfunction, it is recommended that the patient be transferred to 4 N stepdown unit rather than return back to [facility]. Initiation of medical therapy with carvedilol, spironolactone, and probable initiation of angiotension receptorblocker/neprilysin inhibition therapy. Consider short-term life-vest prior to discharge to allow for possible medication induced improvement in LV function.     TTE: 01/29/17 Study Conclusions - Left ventricle: The cavity size was mildly dilated. Systolic function was severely reduced. The estimated ejection fraction was in the range of 20% to 25%. Wall motion was normal; there were no regional wall motion abnormalities. - Left atrium: The atrium was mildly dilated.    ASSESSMENT & PLAN:    1. Chronic systolic CHF - EF of 16-10% on echo . No WMA. EF 10-15% by cath. L/RHC on 01/31/17 which showed moderate CAD and elevated right sided filling pressures.    2. HTN  3. DM  4. HLD - 02/01/2017: Cholesterol 207; HDL 42; LDL Cholesterol 123;  Triglycerides 208; VLDL 42  - Continue statin   Recheck Lipid panel in 3 months and LFT in 4-6 weeks.   Medication Adjustments/Labs and Tests Ordered: Current medicines are reviewed at length with the patient today.  Concerns regarding medicines are outlined above.  Medication changes, Labs and Tests ordered today are listed in the Patient Instructions below. There are no Patient Instructions on file for this visit.   Jarrett Soho, [place]  02/07/2017 8:17 AM    [facility] [place], [place], [place]  [address] Phone: [phone]; Fax: [phone]

## 2017-02-08 ENCOUNTER — Inpatient Hospital Stay: Payer: Self-pay | Admitting: Family Medicine

## 2017-02-08 ENCOUNTER — Telehealth: Payer: Self-pay

## 2017-02-08 NOTE — Telephone Encounter (Signed)
[facility] Post-discharge Follow-Up Phone Call:  Date of Discharge: 02/02/2017 Principal Discharge Diagnosis(es): Acute CHF, HTN, uuncontrolled DM, iron deficiency anemia Post-discharge Communication: (Clearly document all attempts clearly and date contact made) call placed to the patient Call Completed: Yes                   With Whom: patient Interpreter Needed: No     Please check all that apply:  X Patient is knowledgeable of his/her condition(s) and/or treatment. ? Patient is caring for self at home.  X  Patient is receiving assist at home from family and/or caregiver. Family and/or caregiver is knowledgeable of patient's condition(s) and/or treatment. - he stated that he is doing " alright" and is trying to adhere to discharge instructions.  He noted that he is living with his sister and he receives help with managing his care needs from his friend, [name].  He said that [name] is keeping a log of his blood sugars and weights as well as managing his medications.  ? Patient is receiving home health services. If so, name of agency.     Medication Reconciliation:  ? Medication list reviewed with patient. X  Patient obtained all discharge medications. If not, why? - he stated that he has all of his medications and is taking them as ordered. He noted that he was not home and was not able to review his medication list. He said that it would be best to review the medications with [name].  Informed him that this CM just called [name] # [phone] and left a voicemail message requesting a call back. He said that he would call [name] and have her call this CM.  He had no questions about his medication regime at this time.   Activities of Daily Living:  X Independent ? Needs assist (describe; ? home DME used) ? [facility] (describe, ? home DME used)   Community resources in place for patient:  X  None  ? Home Health/Home DME ? Assisted Living ? Support Group       Patient Education:      Instructed him regarding the importance of adhering to his medication regime and continuing to monitor his weights daily and keep a log of his weights. This CM attempted to review with him when to contact his physician about his weight and he deferred to [facility].   He said that he has rescheduled his appointment at the [facility] to 02/15/17 @ 0930 and his appointment with cardiology the same day - 02/16/17 @ 1345.  He said that he needed to cancel his appointment for today because he didn't have transportation.  Reminded him that when participating in the [facility] program cab services can be provided and he stated that he had forgotten.   Questions/Concerns discussed:  He denied any chest pain, dizziness, shortness of breath at this time.  No other questions/concerns reported.

## 2017-02-08 NOTE — Telephone Encounter (Signed)
Addendum to prior call with the patient. He stated that his weight today was 191 lbs and he has been loosing weight. He also reported that his blood sugar was 78 last night and 92 this morning. He said that he is trying to adhere to his recommended diet and has been avoiding drinking soda.

## 2017-02-08 NOTE — Telephone Encounter (Signed)
Message received from Claris Pong requesting a call back. The patient had suggested that this CM contact Ms Quentin Cornwall to review his medication regime.  Call placed to # [phone] (H) and a HIPAA compliant voicemail message was left requesting a call back to # [phone] or [phone].

## 2017-02-14 ENCOUNTER — Telehealth: Payer: Self-pay

## 2017-02-14 NOTE — Telephone Encounter (Signed)
Attempted to contact the patient to check on his status and to remind him of his appointment at [facility] tomorrow - 02/15/17 @ 0930. Calls placed to # [phone] (M) and # [phone] (H) and HIPAA compliant voicemail messages were left requesting a call back to # [phone] or [phone].

## 2017-02-14 NOTE — Telephone Encounter (Signed)
Call received from the patient and confirmed his appointment for tomorrow - 02/15/17 @ 0945. He said that he was not sure yet if he had transportation to the clinic. Instructed him to call the clinic by 1600 today if he is not able to secure a ride and cab transportation will be arranged. He stated that he understood.  Instructed him to bring all of his medications with him to the appointment.

## 2017-02-15 ENCOUNTER — Ambulatory Visit (INDEPENDENT_AMBULATORY_CARE_PROVIDER_SITE_OTHER): Payer: Self-pay | Admitting: Physician Assistant

## 2017-02-15 ENCOUNTER — Ambulatory Visit: Payer: Self-pay | Attending: Family Medicine | Admitting: Family Medicine

## 2017-02-15 ENCOUNTER — Encounter: Payer: Self-pay | Admitting: Family Medicine

## 2017-02-15 ENCOUNTER — Encounter: Payer: Self-pay | Admitting: Physician Assistant

## 2017-02-15 VITALS — BP 144/84 | HR 82 | Temp 98.2°F | Ht 67.0 in | Wt 198.6 lb

## 2017-02-15 VITALS — BP 130/88 | HR 83 | Ht 67.0 in | Wt 201.0 lb

## 2017-02-15 DIAGNOSIS — E785 Hyperlipidemia, unspecified: Secondary | ICD-10-CM | POA: Insufficient documentation

## 2017-02-15 DIAGNOSIS — Z1211 Encounter for screening for malignant neoplasm of colon: Secondary | ICD-10-CM

## 2017-02-15 DIAGNOSIS — I2510 Atherosclerotic heart disease of native coronary artery without angina pectoris: Secondary | ICD-10-CM

## 2017-02-15 DIAGNOSIS — I110 Hypertensive heart disease with heart failure: Secondary | ICD-10-CM | POA: Insufficient documentation

## 2017-02-15 DIAGNOSIS — Z7982 Long term (current) use of aspirin: Secondary | ICD-10-CM | POA: Insufficient documentation

## 2017-02-15 DIAGNOSIS — E1165 Type 2 diabetes mellitus with hyperglycemia: Secondary | ICD-10-CM

## 2017-02-15 DIAGNOSIS — Z89421 Acquired absence of other right toe(s): Secondary | ICD-10-CM | POA: Insufficient documentation

## 2017-02-15 DIAGNOSIS — Z79899 Other long term (current) drug therapy: Secondary | ICD-10-CM | POA: Insufficient documentation

## 2017-02-15 DIAGNOSIS — R059 Cough, unspecified: Secondary | ICD-10-CM

## 2017-02-15 DIAGNOSIS — E1142 Type 2 diabetes mellitus with diabetic polyneuropathy: Secondary | ICD-10-CM

## 2017-02-15 DIAGNOSIS — Z23 Encounter for immunization: Secondary | ICD-10-CM

## 2017-02-15 DIAGNOSIS — I5023 Acute on chronic systolic (congestive) heart failure: Secondary | ICD-10-CM

## 2017-02-15 DIAGNOSIS — Z794 Long term (current) use of insulin: Secondary | ICD-10-CM | POA: Insufficient documentation

## 2017-02-15 DIAGNOSIS — I10 Essential (primary) hypertension: Secondary | ICD-10-CM

## 2017-02-15 DIAGNOSIS — I428 Other cardiomyopathies: Secondary | ICD-10-CM | POA: Insufficient documentation

## 2017-02-15 DIAGNOSIS — I5021 Acute systolic (congestive) heart failure: Secondary | ICD-10-CM

## 2017-02-15 DIAGNOSIS — Z89411 Acquired absence of right great toe: Secondary | ICD-10-CM | POA: Insufficient documentation

## 2017-02-15 DIAGNOSIS — E782 Mixed hyperlipidemia: Secondary | ICD-10-CM | POA: Insufficient documentation

## 2017-02-15 DIAGNOSIS — R05 Cough: Secondary | ICD-10-CM | POA: Insufficient documentation

## 2017-02-15 HISTORY — DX: Other cardiomyopathies: I42.8

## 2017-02-15 LAB — COMPLETE METABOLIC PANEL WITH GFR
ALBUMIN: 3.3 g/dL — AB (ref 3.6–5.1)
ALK PHOS: 75 U/L (ref 40–115)
ALT: 12 U/L (ref 9–46)
AST: 16 U/L (ref 10–35)
BILIRUBIN TOTAL: 0.5 mg/dL (ref 0.2–1.2)
BUN: 14 mg/dL (ref 7–25)
CALCIUM: 8.9 mg/dL (ref 8.6–10.3)
CO2: 21 mmol/L (ref 20–31)
CREATININE: 0.91 mg/dL (ref 0.70–1.33)
Chloride: 108 mmol/L (ref 98–110)
GFR, Est African American: >89 mL/min (ref >=60)
GFR, Est Non African American: >89 mL/min (ref >=60)
GLUCOSE: 134 mg/dL — AB (ref 65–99)
Potassium: 3.9 mmol/L (ref 3.5–5.3)
SODIUM: 139 mmol/L (ref 135–146)
TOTAL PROTEIN: 7 g/dL (ref 6.1–8.1)

## 2017-02-15 LAB — GLUCOSE, POCT (MANUAL RESULT ENTRY): POC Glucose: 111 mg/dl — AB (ref 70–99)

## 2017-02-15 MED ORDER — GLUCOSE BLOOD VI STRP: ORAL_STRIP | 12 refills | Status: DC

## 2017-02-15 MED ORDER — FUROSEMIDE 40 MG PO TABS: 40.0000 mg | ORAL_TABLET | ORAL | 3 refills | Status: DC

## 2017-02-15 MED ORDER — INSULIN GLARGINE 100 UNIT/ML SOLOSTAR PEN: 55.0000 [IU] | PEN_INJECTOR | Freq: Every day | SUBCUTANEOUS | 3 refills | Status: DC

## 2017-02-15 MED ORDER — BENZONATATE 100 MG PO CAPS: 100.0000 mg | ORAL_CAPSULE | Freq: Two times a day (BID) | ORAL | 0 refills | Status: DC | PRN

## 2017-02-15 MED ORDER — TRUEPLUS LANCETS 28G MISC: 1.0000 | Freq: Three times a day (TID) | 12 refills | Status: DC

## 2017-02-15 MED ORDER — TRUE METRIX METER DEVI: 1.0000 | Freq: Three times a day (TID) | 0 refills | Status: DC

## 2017-02-15 MED ORDER — SACUBITRIL-VALSARTAN 24-26 MG PO TABS
1.0000 | ORAL_TABLET | Freq: Two times a day (BID) | ORAL | 11 refills | Status: DC
Start: 2017-02-15 — End: 2017-12-20

## 2017-02-15 MED ORDER — POTASSIUM CHLORIDE ER 10 MEQ PO TBCR: 10.0000 meq | EXTENDED_RELEASE_TABLET | ORAL | 3 refills | Status: DC

## 2017-02-15 MED ORDER — INSULIN ASPART 100 UNIT/ML ~~LOC~~ SOLN: 0.0000 [IU] | Freq: Three times a day (TID) | SUBCUTANEOUS | 3 refills | Status: DC

## 2017-02-15 MED FILL — TRUEplus LANCETS 28G MISC: 30 days supply | Qty: 100 | Fill #0

## 2017-02-15 MED FILL — BENZONATATE 100 MG CAPSULE: 100 | 10 days supply | Qty: 20 | Fill #0

## 2017-02-15 MED FILL — !NOVOLOG 100UNITS/ML VIAL: 100/ML | 28 days supply | Qty: 10 | Fill #0

## 2017-02-15 MED FILL — TRUE METRIX TEST STRIP: 30 days supply | Qty: 100 | Fill #0

## 2017-02-15 MED FILL — !LANTUS SOLOSTAR 100UNITS/M: 100 | 16 days supply | Qty: 9 | Fill #0

## 2017-02-15 MED FILL — TRUE METRIX BLOOD GLUCOSE M: W/DEVICE | 1 days supply | Qty: 1 | Fill #0

## 2017-02-15 NOTE — Progress Notes (Signed)
Cardiology Office Note:    Date:  02/15/2017   ID:  Jeffrey [name], DOB 07-13-1966, MRN [id number]  PCP:  Arnoldo Morale, MD  Cardiologist:  Dr. Liam Rogers   Electrophysiologist:  n/a  Referring MD: Arnoldo Morale, MD   Chief Complaint  Patient presents with  . Hospitalization Follow-up    CHF    History of Present Illness:    Jeffrey [name] is a 52 y.o. male with a hx of IDDM, HTN, HL, prior toe amputations in the setting of osteomyelitis.    Admitted in 2/17-2/22 with acute systolic CHF. Echo demonstrated EF 20-25. He was diuresed with IV Lasix. Cardiac catheterization was arranged. He had moderate nonobstructive disease in the RCA. However, he had no significant CAD to explain his cardiomyopathy. Medical therapy was adjusted. He returns for follow-up.  He is here with his girlfriend.  He has gained ~6 lbs since DC. He notes worsening dyspnea on exertion and orthopnea as well as PND.  He has a non-productive cough.  He denies chest pain, syncope, edema.  He has been watching his salt.  But, today, he did eat at [facility].    Prior CV studies:   The following studies were reviewed today:  Echo 01/29/17 EF 20-25, normal wall motion, mild LAE  LHC 01/31/17 LM normal LAD proximal 10 LCx normal RCA proximal 55, distal 60, RPLB1 60 Mean PA 26, LVEDP 26 EF 10-15  Past Medical History:  Diagnosis Date  . CAD (coronary artery disease)    a. cath 01/31/17: 60% 1st RPLB, 60% dist RCA, 55% prox RCA, 10% pro LAD --> Rx TX.   Marland Kitchen Chronic systolic CHF (congestive heart failure) ([place]) 01/28/2017   1. Echo 01/29/17:  EF 20-25, normal wall motion, mild LAE // 2. EF 10-15 by [facility] 01/2017   . Diabetes mellitus   . Diabetic foot infection ([place]) 03/2016   RT FOOT  . HTN (hypertension)   . Hyperlipidemia   . NICM (nonischemic cardiomyopathy) ([place]) 02/15/2017   1. Mod non-obs CAD on LHC in 01/2017 - CAD does not explain cardiomyopathy    Past Surgical History:  Procedure Laterality Date    . AMPUTATION Right 04/01/2016   Procedure: Right Great Toe Amputation;  Surgeon: Newt Minion, MD;  Location: [facility];  Service: Orthopedics;  Laterality: Right;  . AMPUTATION Right 06/19/2016   Procedure: AMPUTATION SECOND TOE;  Surgeon: Marybelle Killings, MD;  Location: [facility];  Service: Orthopedics;  Laterality: Right;  . BACK SURGERY     for abscess  . RIGHT/LEFT HEART CATH AND CORONARY ANGIOGRAPHY N/A 01/31/2017   Procedure: Right/Left Heart Cath and Coronary Angiography;  Surgeon: Troy Sine, MD;  Location: [facility];  Service: Cardiovascular;  Laterality: N/A;    Current Medications: Current Meds  Medication Sig  . albuterol (PROVENTIL HFA;VENTOLIN HFA) 108 (90 Base) MCG/ACT inhaler Inhale 1-2 puffs into the lungs every 6 (six) hours as needed for wheezing or shortness of breath.  Marland Kitchen aspirin EC 81 MG tablet Take 1 tablet (81 mg total) by mouth daily.  Marland Kitchen atorvastatin (LIPITOR) 80 MG tablet Take 1 tablet (80 mg total) by mouth daily at 6 PM.  . benzonatate (TESSALON) 100 MG capsule Take 1 capsule (100 mg total) by mouth 2 (two) times daily as needed for cough.  . Blood Glucose Monitoring Suppl (TRUE METRIX METER) DEVI 1 each by Does not apply route 3 (three) times daily before meals.  . butalbital-acetaminophen-caffeine (ESGIC) 50-325-40 MG  tablet Take 1 tablet by mouth every 6 (six) hours as needed for headache.  . carvedilol (COREG) 6.25 MG tablet Take 1 tablet (6.25 mg total) by mouth 2 (two) times daily with a meal.  . ferrous sulfate 325 (65 FE) MG tablet Take 1 tablet (325 mg total) by mouth 2 (two) times daily with a meal.  . gabapentin (NEURONTIN) 400 MG capsule Take 1 capsule (400 mg total) by mouth 3 (three) times daily.  Marland Kitchen glucose blood (TRUE METRIX BLOOD GLUCOSE TEST) test strip Use 3 times daily before meals  . insulin aspart (NOVOLOG) 100 UNIT/ML injection Inject 0-15 Units into the skin 3 (three) times daily with meals. Sliding scale  CBG 70 - 120: 0 units: CBG 121 -  140: 2 units; CBG 140 - 200: 4 units; CBG 201 - 240: 6 units; CBG 240 - 300: 8 units;CBG 300 - 350: 12 units; CBG 351 - 400: 16 units; CBG > 400 : 16 units and notify your  MD  . Insulin Glargine (LANTUS SOLOSTAR) 100 UNIT/ML Solostar Pen Inject 55 Units into the skin daily at 10 pm.  . lactulose (CHRONULAC) 10 GM/15ML solution Take 15 mLs (10 g total) by mouth 2 (two) times daily as needed for mild constipation.  . TRUEPLUS LANCETS 28G MISC 1 each by Does not apply route 3 (three) times daily before meals.  . [DISCONTINUED] furosemide (LASIX) 40 MG tablet Take 1 tablet (40 mg total) by mouth daily.  . [DISCONTINUED] lisinopril (PRINIVIL,ZESTRIL) 20 MG tablet Take 1 tablet (20 mg total) by mouth daily.     Allergies:   Patient has no known allergies.   Social History   Social History  . Marital status: Significant Other    Spouse name: N/A  . Number of children: N/A  . Years of education: N/A   Social History Main Topics  . Smoking status: Never Smoker  . Smokeless tobacco: Never Used  . Alcohol use No  . Drug use: No  . Sexual activity: Not Asked   Other Topics Concern  . None   Social History Narrative  . None     Family History  Problem Relation Age of Onset  . Diabetes Mother   . Hypertension Mother   . Diabetes Father   . Heart attack Father   . Diabetes Sister   . Heart attack Maternal Grandmother      ROS:   Please see the history of present illness.    ROS All other systems reviewed and are negative.   EKGs/Labs/Other Test Reviewed:    EKG:  EKG is  ordered today.  The ekg ordered today demonstrates NSR, HR 83, LAD, NSSTTW changes, QTc 495 ms  Recent Labs: 01/28/2017: ALT 14; B Natriuretic Peptide 206.5 01/29/2017: TSH 0.847 01/30/2017: Magnesium 1.8 02/02/2017: BUN 18; Creatinine, Ser 1.13; Hemoglobin 12.5; Platelets 317; Potassium 4.4; Sodium 136   Recent Lipid Panel    Component Value Date/Time   CHOL 207 (H) 02/01/2017 0259   TRIG 208 (H)  02/01/2017 0259   HDL 42 02/01/2017 0259   CHOLHDL 4.9 02/01/2017 0259   VLDL 42 (H) 02/01/2017 0259   LDLCALC 123 (H) 02/01/2017 0259     Physical Exam:    VS:  BP 130/88   Pulse 83   Ht 5\' 7"  (1.702 m)   Wt 201 lb (91.2 kg)   BMI 31.48 kg/m     Wt Readings from Last 3 Encounters:  02/15/17 201 lb (91.2 kg)  02/15/17 198  lb 9.6 oz (90.1 kg)  02/02/17 192 lb 7.4 oz (87.3 kg)     Physical Exam  Constitutional: He is oriented to person, place, and time. He appears well-developed and well-nourished. No distress.  HENT:  Head: Normocephalic and atraumatic.  Eyes: No scleral icterus.  Neck: JVD present.  JVP 6 cm  Cardiovascular: Normal rate, regular rhythm and normal heart sounds.  Exam reveals no gallop.   No murmur heard. Pulmonary/Chest: He has no wheezes. He has rales in the right lower field and the left lower field.  Abdominal: Soft. There is no tenderness.  Musculoskeletal: He exhibits no edema.  R groin without hematoma or bruit  Neurological: He is alert and oriented to person, place, and time.  Skin: Skin is warm and dry.  Psychiatric: He has a normal mood and affect.    ASSESSMENT:    1. Acute on chronic systolic CHF (congestive heart failure) ([place])   2. NICM (nonischemic cardiomyopathy) ([place])   3. Coronary artery disease involving native coronary artery of native heart without angina pectoris   4. Essential hypertension   5. Hyperlipidemia, unspecified hyperlipidemia type   6. Uncontrolled type 2 diabetes mellitus with hyperglycemia, with long-term current use of insulin ([facility])    PLAN:    In order of problems listed above:  1. Acute on chronic systolic CHF (congestive heart failure) ([place]) - He is volume overloaded.  Weight is up 6 lbs since DC.  He is currently tolerating Coreg and Lisinopril.  He is NYHA 2b-3 with an EF 20-25. He would benefit from [place].  His pharmacy will cover it for $10 per 90 days.  -  Increase Lasix to 40 mg bid x 3 days, then  reduce to 60 mg QD  -  K+ 20 mEq QD x 3 days, then reduce to 10 mEq QD  -  DC Lisinopril   -  After 36 hours, start Entresto 24/26 mg bid  -  BMET 1 week  2. NICM (nonischemic cardiomyopathy) ([place]) - After 3 mos of Rx, repeat Echo. If EF < 35, refer to EP for ICD.  3. Coronary artery disease involving native coronary artery of native heart without angina pectoris - No angina.  He had mod non-obs disease on LHC.  Continue ASA, statin.  4. Essential hypertension - BP is controlled.    5. Hyperlipidemia, unspecified hyperlipidemia type - Continue statin.  6. Diabetes Mellitus - FU with PCP for strict control.   Dispo:  Return in about 2 weeks (around 03/01/2017) for Close Follow Up, w/ [name], PA-C.   Medication Adjustments/Labs and Tests Ordered: Current medicines are reviewed at length with the patient today.  Concerns regarding medicines are outlined above.  Medication changes, Labs and Tests ordered today are outlined in the Patient Instructions noted below. Patient Instructions  Medication Instructions:  1. STOP LISINOPRIL AS OF TODAY 2. YOU WILL START ENTRESTO 24/26 MG 1 TABLET TWICE DAILY BEGINNING 02/16/17 AT 8 PM 3. INCREASE LASIX TO 40 MG TWICE DAILY FOR 3 DAYS THEN GO TO LASIX 60 MG DAILY (1 AND 1/2 TABS DAILY) 4. START POTASSIUM 10 MEQ TWICE DAILY FOR 3 DAYS THEN GO TO POTASSIUM 10 MEQ DAILY  Labwork: 1. BMET TO BE DONE IN 1 WEEK  Testing/Procedures: NONE  Follow-Up: SCOTT WEAVER, PAC   Any Other Special Instructions Will Be Listed Below (If Applicable).  If you need a refill on your cardiac medications before your next appointment, please call your pharmacy.  Signed, Nicki Reaper  Jorene Minors  02/15/2017 2:26 PM    [facility] [place], [place], [place]  [address] Phone: [phone]; Fax: [phone]

## 2017-02-15 NOTE — Patient Instructions (Addendum)
Medication Instructions:  1. STOP LISINOPRIL AS OF TODAY  2. YOU WILL START ENTRESTO 24/26 MG 1 TABLET TWICE DAILY BEGINNING 02/16/17 AT 8 PM  3. INCREASE LASIX TO 40 MG TWICE DAILY FOR 3 DAYS THEN GO TO LASIX 60 MG DAILY (1 AND 1/2 TABS DAILY)  4. START POTASSIUM 10 MEQ TWICE DAILY FOR 3 DAYS THEN GO TO POTASSIUM 10 MEQ DAILY  Labwork: 1. BMET TO BE DONE IN 1 WEEK 02/23/17 ANYTIME BETWEEN 7:30 AM AND 4:30 PM  Testing/Procedures: NONE  Follow-Up: 03/07/17 @ 2:15 WITH SCOTT WEAVER, PAC   Any Other Special Instructions Will Be Listed Below (If Applicable).  If you need a refill on your cardiac medications before your next appointment, please call your pharmacy.

## 2017-02-15 NOTE — Progress Notes (Signed)
[facility]  Hospitalization dates: 01/28/17 through 02/02/17  Date of telephone encounter: 01/31/17  Subjective:  Patient ID: Jeffrey [name], male    DOB: 01/07/1966  Age: 51 y.o. MRN: [id number]  CC: Hospitalization Follow-up; Congestive Heart Failure; Diabetes; and Cough   HPI WILLIE PLAIN s a 51 year old male with a history of type 2 diabetes mellitus (A1c 11.8 from 01/2017), diabetic neuropathy, status post right great and second toe amputation , hypertension, noncompliance who presents today for follow-up from [facility] after hospitalization for newly diagnosed acute CHF.   He had presented to the ED with shortness of breath, BNP of 206.5, chest x-ray revealed cardiomegaly and mild interstitial edema, 2-D echo revealed EF of 20-25%, severely reduced systolic function, no regional wall motion abnormalities. He was started on IV Lasix, seen by cardiology and underwent right and left heart cardiac cath.   Cardiac catheterization Conclusion     1st RPLB lesion, 60 %stenosed.  Dist RCA lesion, 60 %stenosed.  Prox RCA lesion, 55 %stenosed.  Prox LAD lesion, 10 %stenosed.  There is severe left ventricular systolic dysfunction.  LV end diastolic pressure is mildly elevated.  Severe global LV dysfunction with an ejection fraction of 10-15%. The pattern is one of a nonischemic cardiomyopathy.  Mild coronary obstructive disease with smooth 10% narrowing in the LAD; normal ramus intermediate, normal left circumflex; and RCA with 50-60% proximal stenosis and distal tapering of 60%. Prior to giving rise to 3 small distal branches with 60% narrowing in a small inferior LV branch. The patient's LV dysfunction is out of proportion to his CAD.    He is accompanied by his significant other today and denies chest pain but endorses some shortness of breath when he has to tie his shoes, he has orthopnea. He also has a dry cough but no fever or  rhinorrhea. Fasting blood sugars have been 68-94 and the patient has cut back significantly on sodas and bread; checks his weight daily and weight has been 196-196 lbs He has an appointment with cardiology today.  Past Medical History:  Diagnosis Date  . CAD (coronary artery disease)    a. cath 01/31/17: 60% 1st RPLB, 60% dist RCA, 55% prox RCA, 10% pro LAD --> Rx TX.   . Diabetes mellitus   . Diabetic foot infection ([place]) 03/2016   RT FOOT  . HTN (hypertension)   . Hyperlipidemia     Past Surgical History:  Procedure Laterality Date  . AMPUTATION Right 04/01/2016   Procedure: Right Great Toe Amputation;  Surgeon: Newt Minion, MD;  Location: [place];  Service: Orthopedics;  Laterality: Right;  . AMPUTATION Right 06/19/2016   Procedure: AMPUTATION SECOND TOE;  Surgeon: Marybelle Killings, MD;  Location: [place];  Service: Orthopedics;  Laterality: Right;  . BACK SURGERY     for abscess  . RIGHT/LEFT HEART CATH AND CORONARY ANGIOGRAPHY N/A 01/31/2017   Procedure: Right/Left Heart Cath and Coronary Angiography;  Surgeon: Troy Sine, MD;  Location: [place] [facility];  Service: Cardiovascular;  Laterality: N/A;    No Known Allergies     Outpatient Medications Prior to Visit  Medication Sig Dispense Refill  . albuterol (PROVENTIL HFA;VENTOLIN HFA) 108 (90 Base) MCG/ACT inhaler Inhale 1-2 puffs into the lungs every 6 (six) hours as needed for wheezing or shortness of breath. 1 Inhaler 0  . aspirin EC 81 MG tablet Take 1 tablet (81 mg total) by mouth daily. 30 tablet 3  . atorvastatin (  LIPITOR) 80 MG tablet Take 1 tablet (80 mg total) by mouth daily at 6 PM. 30 tablet 3  . butalbital-acetaminophen-caffeine (ESGIC) 50-325-40 MG tablet Take 1 tablet by mouth every 6 (six) hours as needed for headache. 30 tablet 0  . carvedilol (COREG) 6.25 MG tablet Take 1 tablet (6.25 mg total) by mouth 2 (two) times daily with a meal. 60 tablet 1  . ferrous sulfate 325 (65 FE) MG tablet Take 1 tablet (325 mg  total) by mouth 2 (two) times daily with a meal. 60 tablet 3  . furosemide (LASIX) 40 MG tablet Take 1 tablet (40 mg total) by mouth daily. 30 tablet 1  . gabapentin (NEURONTIN) 400 MG capsule Take 1 capsule (400 mg total) by mouth 3 (three) times daily. 90 capsule 3  . lactulose (CHRONULAC) 10 GM/15ML solution Take 15 mLs (10 g total) by mouth 2 (two) times daily as needed for mild constipation. 946 mL 1  . lisinopril (PRINIVIL,ZESTRIL) 20 MG tablet Take 1 tablet (20 mg total) by mouth daily. 30 tablet 3  . insulin aspart (NOVOLOG) 100 UNIT/ML injection Inject 0-15 Units into the skin 3 (three) times daily with meals. Sliding scale  CBG 70 - 120: 0 units: CBG 121 - 140: 2 units; CBG 140 - 200: 4 units; CBG 201 - 240: 6 units; CBG 240 - 300: 8 units;CBG 300 - 350: 12 units; CBG 351 - 400: 16 units; CBG > 400 : 16 units and notify your  MD 10 mL 3  . Insulin Glargine (LANTUS SOLOSTAR) 100 UNIT/ML Solostar Pen Inject 55 Units into the skin daily at 10 pm. 5 pen 3   No facility-administered medications prior to visit.     ROS Review of Systems  Constitutional: Negative for activity change and appetite change.  HENT: Negative for sinus pressure and sore throat.   Eyes: Negative for visual disturbance.  Respiratory: Positive for cough and shortness of breath. Negative for chest tightness.   Cardiovascular: Negative for chest pain and leg swelling.  Gastrointestinal: Negative for abdominal distention, abdominal pain, constipation and diarrhea.  Endocrine: Negative.   Genitourinary: Negative for dysuria.  Musculoskeletal: Negative for joint swelling and myalgias.  Skin: Negative for rash.  Allergic/Immunologic: Negative.   Neurological: Positive for numbness. Negative for weakness and light-headedness.  Psychiatric/Behavioral: Negative for dysphoric mood and suicidal ideas.    Objective:  BP (!) 144/84 (BP Location: Right Arm, Patient Position: Sitting, Cuff Size: Small)   Pulse 82   Temp  98.2 F (36.8 C) (Oral)   Ht 5\' 7"  (1.702 m)   Wt 198 lb 9.6 oz (90.1 kg)   SpO2 99%   BMI 31.11 kg/m    Wt Readings from Last 3 Encounters:  02/15/17 198 lb 9.6 oz (90.1 kg)  02/02/17 192 lb 7.4 oz (87.3 kg)  11/15/16 199 lb (90.3 kg)     BP/Weight 02/15/2017 02/02/2017 78/03/6961  Systolic BP 952 841 324  Diastolic BP 84 75 94  Wt. (Lbs) 198.6 192.46 199  BMI 31.11 30.14 31.17      Physical Exam  Constitutional: He is oriented to person, place, and time. He appears well-developed and well-nourished.  Neck: No JVD present.  Cardiovascular: Normal rate, normal heart sounds and intact distal pulses.   No murmur heard. Pulmonary/Chest: Effort normal and breath sounds normal. He has no wheezes. He has no rales. He exhibits no tenderness.  Abdominal: Soft. Bowel sounds are normal. He exhibits no distension and no mass. There is no  tenderness.  Musculoskeletal: Normal range of motion.  Neurological: He is alert and oriented to person, place, and time.  Skin: Skin is warm and dry.  Psychiatric: He has a normal mood and affect.    Lab Results  Component Value Date   HGBA1C 11.8 (H) 01/29/2017    CMP Latest Ref Rng & Units 02/02/2017 02/01/2017 01/31/2017  Glucose 65 - 99 mg/dL 90 161(H) -  BUN 6 - 20 mg/dL 18 12 -  Creatinine 0.61 - 1.24 mg/dL 1.13 1.04 0.90  Sodium 135 - 145 mmol/L 136 134(L) -  Potassium 3.5 - 5.1 mmol/L 4.4 4.0 -  Chloride 101 - 111 mmol/L 103 102 -  CO2 22 - 32 mmol/L 28 25 -  Calcium 8.9 - 10.3 mg/dL 9.2 8.8(L) -  Total Protein 6.5 - 8.1 g/dL - - -  Total Bilirubin 0.3 - 1.2 mg/dL - - -  Alkaline Phos 38 - 126 U/L - - -  AST 15 - 41 U/L - - -  ALT 17 - 63 U/L - - -    CBC    Component Value Date/Time   WBC 4.6 02/02/2017 0440   RBC 5.36 02/02/2017 0440   HGB 12.5 (L) 02/02/2017 0440   HCT 40.1 02/02/2017 0440   PLT 317 02/02/2017 0440   MCV 74.8 (L) 02/02/2017 0440   MCH 23.3 (L) 02/02/2017 0440   MCHC 31.2 02/02/2017 0440   RDW 15.8 (H)  02/02/2017 0440   LYMPHSABS 1.3 01/28/2017 2127   MONOABS 0.3 01/28/2017 2127   EOSABS 0.0 01/28/2017 2127   BASOSABS 0.0 01/28/2017 2127     Assessment & Plan:   1. Uncontrolled type 2 diabetes mellitus with hyperglycemia, with long-term current use of insulin ([place]) Uncontrolled with A1c of 11.8 from 01/2017 Blood sugar log reveals improvement No regimen change We'll review blood sugar log at next visit Diabetic diet - Glucose (CBG) - Insulin Glargine (LANTUS SOLOSTAR) 100 UNIT/ML Solostar Pen; Inject 55 Units into the skin daily at 10 pm.  Dispense: 5 pen; Refill: 3 - insulin aspart (NOVOLOG) 100 UNIT/ML injection; Inject 0-15 Units into the skin 3 (three) times daily with meals. Sliding scale  CBG 70 - 120: 0 units: CBG 121 - 140: 2 units; CBG 140 - 200: 4 units; CBG 201 - 240: 6 units; CBG 240 - 300: 8 units;CBG 300 - 350: 12 units; CBG 351 - 400: 16 units; CBG > 400 : 16 units and notify your  MD  Dispense: 10 mL; Refill: 3 - COMPLETE METABOLIC PANEL WITH GFR - glucose blood (TRUE METRIX BLOOD GLUCOSE TEST) test strip; Use 3 times daily before meals  Dispense: 100 each; Refill: 12 - Blood Glucose Monitoring Suppl (TRUE METRIX METER) DEVI; 1 each by Does not apply route 3 (three) times daily before meals.  Dispense: 1 Device; Refill: 0 - TRUEPLUS LANCETS 28G MISC; 1 each by Does not apply route 3 (three) times daily before meals.  Dispense: 100 each; Refill: 12  2. Acute systolic congestive heart failure (HCC) EF of 20-25%, NYHA II-III Gained 6 pounds since discharge 13 days ago No regimen change for now Continue daily weight checks, limit fluid intake to less than 2 negative per day. Continue Lasix, ACE inhibitor, beta blocker Keep appointment with cardiology  3. Diabetic polyneuropathy associated with type 2 diabetes mellitus (HCC) Remains on gabapentin  4. Screening for colon cancer - Ambulatory referral to Gastroenterology  5. Cough Cough could be symptom of  CHF Consider adding Zyrtec  at next viist if symptoms persist. - benzonatate (TESSALON) 100 MG capsule; Take 1 capsule (100 mg total) by mouth 2 (two) times daily as needed for cough.  Dispense: 20 capsule; Refill: 0  6. Essential hypertension Slightly above goal of less than 1:30/80 Low-sodium, DASH diet We'll reassess at next visit   Meds ordered this encounter  Medications  . Insulin Glargine (LANTUS SOLOSTAR) 100 UNIT/ML Solostar Pen    Sig: Inject 55 Units into the skin daily at 10 pm.    Dispense:  5 pen    Refill:  3    Discontinue previous dose  . insulin aspart (NOVOLOG) 100 UNIT/ML injection    Sig: Inject 0-15 Units into the skin 3 (three) times daily with meals. Sliding scale  CBG 70 - 120: 0 units: CBG 121 - 140: 2 units; CBG 140 - 200: 4 units; CBG 201 - 240: 6 units; CBG 240 - 300: 8 units;CBG 300 - 350: 12 units; CBG 351 - 400: 16 units; CBG > 400 : 16 units and notify your  MD    Dispense:  10 mL    Refill:  3  . benzonatate (TESSALON) 100 MG capsule    Sig: Take 1 capsule (100 mg total) by mouth 2 (two) times daily as needed for cough.    Dispense:  20 capsule    Refill:  0  . glucose blood (TRUE METRIX BLOOD GLUCOSE TEST) test strip    Sig: Use 3 times daily before meals    Dispense:  100 each    Refill:  12  . Blood Glucose Monitoring Suppl (TRUE METRIX METER) DEVI    Sig: 1 each by Does not apply route 3 (three) times daily before meals.    Dispense:  1 Device    Refill:  0  . TRUEPLUS LANCETS 28G MISC    Sig: 1 each by Does not apply route 3 (three) times daily before meals.    Dispense:  100 each    Refill:  12    Follow-up: Return in about 1 month (around 03/18/2017) for Follow-up on diabetes mellitus and hypertension.   Arnoldo Morale MD

## 2017-02-15 NOTE — Patient Instructions (Signed)
Diabetes Mellitus and Food It is important for you to manage your blood sugar (glucose) level. Your blood glucose level can be greatly affected by what you eat. Eating healthier foods in the appropriate amounts throughout the day at about the same time each day will help you control your blood glucose level. It can also help slow or prevent worsening of your diabetes mellitus. Healthy eating may even help you improve the level of your blood pressure and reach or maintain a healthy weight. General recommendations for healthful eating and cooking habits include:  Eating meals and snacks regularly. Avoid going long periods of time without eating to lose weight.  Eating a diet that consists mainly of plant-based foods, such as fruits, vegetables, nuts, legumes, and whole grains.  Using low-heat cooking methods, such as baking, instead of high-heat cooking methods, such as deep frying.  Work with your dietitian to make sure you understand how to use the Nutrition Facts information on food labels. How can food affect me? Carbohydrates Carbohydrates affect your blood glucose level more than any other type of food. Your dietitian will help you determine how many carbohydrates to eat at each meal and teach you how to count carbohydrates. Counting carbohydrates is important to keep your blood glucose at a healthy level, especially if you are using insulin or taking certain medicines for diabetes mellitus. Alcohol Alcohol can cause sudden decreases in blood glucose (hypoglycemia), especially if you use insulin or take certain medicines for diabetes mellitus. Hypoglycemia can be a life-threatening condition. Symptoms of hypoglycemia (sleepiness, dizziness, and disorientation) are similar to symptoms of having too much alcohol. If your health care provider has given you approval to drink alcohol, do so in moderation and use the following guidelines:  Women should not have more than one drink per day, and men  should not have more than two drinks per day. One drink is equal to: ? 12 oz of beer. ? 5 oz of wine. ? 1 oz of hard liquor.  Do not drink on an empty stomach.  Keep yourself hydrated. Have water, diet soda, or unsweetened iced tea.  Regular soda, juice, and other mixers might contain a lot of carbohydrates and should be counted.  What foods are not recommended? As you make food choices, it is important to remember that all foods are not the same. Some foods have fewer nutrients per serving than other foods, even though they might have the same number of calories or carbohydrates. It is difficult to get your body what it needs when you eat foods with fewer nutrients. Examples of foods that you should avoid that are high in calories and carbohydrates but low in nutrients include:  Trans fats (most processed foods list trans fats on the Nutrition Facts label).  Regular soda.  Juice.  Candy.  Sweets, such as cake, pie, doughnuts, and cookies.  Fried foods.  What foods can I eat? Eat nutrient-rich foods, which will nourish your body and keep you healthy. The food you should eat also will depend on several factors, including:  The calories you need.  The medicines you take.  Your weight.  Your blood glucose level.  Your blood pressure level.  Your cholesterol level.  You should eat a variety of foods, including:  Protein. ? Lean cuts of meat. ? Proteins low in saturated fats, such as fish, egg whites, and beans. Avoid processed meats.  Fruits and vegetables. ? Fruits and vegetables that may help control blood glucose levels, such as apples,   mangoes, and yams.  Dairy products. ? Choose fat-free or low-fat dairy products, such as milk, yogurt, and cheese.  Grains, bread, pasta, and rice. ? Choose whole grain products, such as multigrain bread, whole oats, and brown rice. These foods may help control blood pressure.  Fats. ? Foods containing healthful fats, such as  nuts, avocado, olive oil, canola oil, and fish.  Does everyone with diabetes mellitus have the same meal plan? Because every person with diabetes mellitus is different, there is not one meal plan that works for everyone. It is very important that you meet with a dietitian who will help you create a meal plan that is just right for you. This information is not intended to replace advice given to you by your health care provider. Make sure you discuss any questions you have with your health care provider. Document Released: 08/25/2005 Document Revised: 05/05/2016 Document Reviewed: 10/25/2013 Elsevier Interactive Patient Education  2017 [employer] Inc.  

## 2017-02-15 NOTE — Progress Notes (Signed)
Refill both insulins Seeing cardiology today

## 2017-02-16 ENCOUNTER — Other Ambulatory Visit: Payer: Self-pay | Admitting: *Deleted

## 2017-02-16 MED FILL — POTASSIUM CL 10 MEQ TAB SA: 10 | 30 days supply | Qty: 60 | Fill #0

## 2017-02-16 MED FILL — ENTRESTO 24 MG-26 MG TABLET: 24-26 | 30 days supply | Qty: 60 | Fill #0

## 2017-02-16 MED FILL — FUROSEMIDE 40 MG TABLET: 40 | 30 days supply | Qty: 47 | Fill #0

## 2017-02-16 NOTE — Telephone Encounter (Signed)
Pt was given a month supply of entresto 24/26 on 02/15/17

## 2017-02-17 ENCOUNTER — Telehealth: Payer: Self-pay

## 2017-02-17 NOTE — Telephone Encounter (Signed)
-----   Message from Arnoldo Morale, MD sent at 02/16/2017 12:54 PM EST ----- Labs are stable.

## 2017-02-17 NOTE — Telephone Encounter (Signed)
Writer called patient and LVM that his labs were normal and if he had questions he could call.

## 2017-02-23 ENCOUNTER — Encounter: Payer: Self-pay | Admitting: Physician Assistant

## 2017-02-23 ENCOUNTER — Other Ambulatory Visit: Payer: Self-pay

## 2017-03-03 ENCOUNTER — Telehealth: Payer: Self-pay

## 2017-03-03 NOTE — Telephone Encounter (Signed)
Call placed to the patient to check on his status. He had an appointment scheduled with the [facility] Pharmacist today for medication teaching.  This CM spoke to his friend, [name], who stated that she had wanted to re-schedule the appointment for today.  She noted that the patient is ' doing good" and " trying hard."  She said that he has all of his medications and has been taking them as ordered. She also reported that he has been checking his weight daily and it has been between 190-194 lbs. She was in a hurry and needed to go and said that she would call back Monday to reschedule the appointment with pharmacy and then hung up.  No other concerns/questions reported.

## 2017-03-07 ENCOUNTER — Ambulatory Visit: Payer: Self-pay | Admitting: Physician Assistant

## 2017-03-13 MED FILL — !LANTUS SOLOSTAR 100UNITS/M: 100 | 16 days supply | Qty: 9 | Fill #1

## 2017-03-14 MED FILL — CARVEDILOL 6.25 MG TABLET: 6.25 | 30 days supply | Qty: 60 | Fill #1

## 2017-03-20 ENCOUNTER — Telehealth: Payer: Self-pay

## 2017-03-20 ENCOUNTER — Telehealth: Payer: Self-pay | Admitting: *Deleted

## 2017-03-20 NOTE — Telephone Encounter (Addendum)
Appointment for Wednesday, April 11 at 4:15 pm confirmed with [name].  She adds that patient has swelling and pain in right leg and foot. This is the same leg that he has had amputation of toes.  He has fluctuations with his weight, high as 196 lbs. His previous weights have been between 190-194 lbs. He weighs twice daily. Also has cough and SOB, taking Lasix 1 1/2  Tablets everyday. Denies salt intake. Not sure about fluid intake. Encouraged to bring weight and blood sugar log to OV. He also needs to schedule apt. With pharmacy PASS program for Lantus.

## 2017-03-20 NOTE — Telephone Encounter (Signed)
Called to find out if pt would like to have colonoscopy scheduled 

## 2017-03-22 ENCOUNTER — Ambulatory Visit: Payer: Self-pay | Admitting: Family Medicine

## 2017-03-22 ENCOUNTER — Ambulatory Visit: Payer: Self-pay | Attending: Family Medicine | Admitting: Family Medicine

## 2017-03-22 ENCOUNTER — Encounter: Payer: Self-pay | Admitting: Family Medicine

## 2017-03-22 VITALS — BP 147/88 | HR 90 | Temp 97.9°F | Ht 67.0 in | Wt 201.0 lb

## 2017-03-22 DIAGNOSIS — I10 Essential (primary) hypertension: Secondary | ICD-10-CM

## 2017-03-22 DIAGNOSIS — Z7982 Long term (current) use of aspirin: Secondary | ICD-10-CM | POA: Insufficient documentation

## 2017-03-22 DIAGNOSIS — R05 Cough: Secondary | ICD-10-CM | POA: Insufficient documentation

## 2017-03-22 DIAGNOSIS — Z89411 Acquired absence of right great toe: Secondary | ICD-10-CM | POA: Insufficient documentation

## 2017-03-22 DIAGNOSIS — R059 Cough, unspecified: Secondary | ICD-10-CM

## 2017-03-22 DIAGNOSIS — Z79899 Other long term (current) drug therapy: Secondary | ICD-10-CM | POA: Insufficient documentation

## 2017-03-22 DIAGNOSIS — I428 Other cardiomyopathies: Secondary | ICD-10-CM | POA: Insufficient documentation

## 2017-03-22 DIAGNOSIS — I5022 Chronic systolic (congestive) heart failure: Secondary | ICD-10-CM | POA: Insufficient documentation

## 2017-03-22 DIAGNOSIS — I2510 Atherosclerotic heart disease of native coronary artery without angina pectoris: Secondary | ICD-10-CM | POA: Insufficient documentation

## 2017-03-22 DIAGNOSIS — E1165 Type 2 diabetes mellitus with hyperglycemia: Secondary | ICD-10-CM | POA: Insufficient documentation

## 2017-03-22 DIAGNOSIS — E785 Hyperlipidemia, unspecified: Secondary | ICD-10-CM | POA: Insufficient documentation

## 2017-03-22 DIAGNOSIS — Z89421 Acquired absence of other right toe(s): Secondary | ICD-10-CM | POA: Insufficient documentation

## 2017-03-22 DIAGNOSIS — Z794 Long term (current) use of insulin: Secondary | ICD-10-CM | POA: Insufficient documentation

## 2017-03-22 DIAGNOSIS — I110 Hypertensive heart disease with heart failure: Secondary | ICD-10-CM | POA: Insufficient documentation

## 2017-03-22 LAB — GLUCOSE, POCT (MANUAL RESULT ENTRY): POC GLUCOSE: 171 mg/dL — AB (ref 70–99)

## 2017-03-22 MED ORDER — ATORVASTATIN CALCIUM 80 MG PO TABS: 80.0000 mg | ORAL_TABLET | Freq: Every day | ORAL | 3 refills | Status: DC

## 2017-03-22 MED ORDER — BENZONATATE 100 MG PO CAPS: 100.0000 mg | ORAL_CAPSULE | Freq: Three times a day (TID) | ORAL | 1 refills | Status: DC | PRN

## 2017-03-22 MED FILL — ATORVASTATIN 80 MG TABLET: 80 | 30 days supply | Qty: 30 | Fill #0

## 2017-03-22 NOTE — Telephone Encounter (Signed)
This will be addressed at his office visit.

## 2017-03-22 NOTE — Progress Notes (Signed)
Subjective:  Patient ID: Jeffrey [name], male    DOB: 1966-09-13  Age: 51 y.o. MRN: [id number]  CC: Diabetes; Congestive Heart Failure; Edema (right foot and ankle); and Cough   HPI Jeffrey [name] presents Jeffrey [name] is a 51 year old male with a history of type 2 diabetes mellitus (A1c 11.8 from 01/2017), diabetic neuropathy, status post right great and second toe amputation , hypertension, Nonischemic cardiomyopathy, CHF (EF to 25% from 2-D echo 01/2017) who presents today Complaining of cough and edema of his right ankle.   He is accompanied by his girlfriend who states she gave him an extra pill of Lasix 40 mg with resolution of the edema however he has a dry cough when he lies down flat. He is currently on 60 mg of Lasix daily and has been compliant with this He has been compliant with checking his weight which has fluctuated between 190 and 196 over the last month; his weight today was 196 lbs but is 201lbs in the clinic today (his weight at his last office visit was 198 lbs). He has a 2 pillow orthopnea and has dyspnea only on severe exertion.   2-D echo 01/31/17 - revealed EF of 20-25%, severely reduced systolic function, no regional wall motion abnormalities. Right/left heart cath 01/29/17 - Mild coronary obstructive disease with smooth 10% narrowing in the LAD; normal ramus intermediate, normal left circumflex; and RCA with 50-60% proximal stenosis and distal tapering of 60% prior to giving rise to 3 small distal branches with 60% narrowing in a small inferior LV branch. Severe global LV dysfunction with EF of 10-15%, pattern is one of nonischemic cardiomyopathy     Past Medical History:  Diagnosis Date  . CAD (coronary artery disease)    a. cath 01/31/17: 60% 1st RPLB, 60% dist RCA, 55% prox RCA, 10% pro LAD --> Rx TX.   Marland Kitchen Chronic systolic CHF (congestive heart failure) ([place]) 01/28/2017   1. Echo 01/29/17:  EF 20-25, normal wall motion, mild LAE // 2. EF 10-15 by [facility] 01/2017    . Diabetes mellitus   . Diabetic foot infection ([place]) 03/2016   RT FOOT  . HTN (hypertension)   . Hyperlipidemia   . NICM (nonischemic cardiomyopathy) ([place]) 02/15/2017   1. Mod non-obs CAD on LHC in 01/2017 - CAD does not explain cardiomyopathy    Past Surgical History:  Procedure Laterality Date  . AMPUTATION Right 04/01/2016   Procedure: Right Great Toe Amputation;  Surgeon: Newt Minion, MD;  Location: [place];  Service: Orthopedics;  Laterality: Right;  . AMPUTATION Right 06/19/2016   Procedure: AMPUTATION SECOND TOE;  Surgeon: Marybelle Killings, MD;  Location: [place];  Service: Orthopedics;  Laterality: Right;  . BACK SURGERY     for abscess  . RIGHT/LEFT HEART CATH AND CORONARY ANGIOGRAPHY N/A 01/31/2017   Procedure: Right/Left Heart Cath and Coronary Angiography;  Surgeon: Troy Sine, MD;  Location: [place] [facility];  Service: Cardiovascular;  Laterality: N/A;    No Known Allergies     Outpatient Medications Prior to Visit  Medication Sig Dispense Refill  . albuterol (PROVENTIL HFA;VENTOLIN HFA) 108 (90 Base) MCG/ACT inhaler Inhale 1-2 puffs into the lungs every 6 (six) hours as needed for wheezing or shortness of breath. 1 Inhaler 0  . aspirin EC 81 MG tablet Take 1 tablet (81 mg total) by mouth daily. 30 tablet 3  . Blood Glucose Monitoring Suppl (TRUE METRIX METER) DEVI 1 each by Does not apply  route 3 (three) times daily before meals. 1 Device 0  . butalbital-acetaminophen-caffeine (ESGIC) 50-325-40 MG tablet Take 1 tablet by mouth every 6 (six) hours as needed for headache. 30 tablet 0  . carvedilol (COREG) 6.25 MG tablet Take 1 tablet (6.25 mg total) by mouth 2 (two) times daily with a meal. 60 tablet 1  . ferrous sulfate 325 (65 FE) MG tablet Take 1 tablet (325 mg total) by mouth 2 (two) times daily with a meal. 60 tablet 3  . furosemide (LASIX) 40 MG tablet Take 1 tablet (40 mg total) by mouth as directed. Take 1 tab twice daily for 3 days; then change to 1 and 1/2 tabs =  60 mg once a day 140 tablet 3  . gabapentin (NEURONTIN) 400 MG capsule Take 1 capsule (400 mg total) by mouth 3 (three) times daily. 90 capsule 3  . glucose blood (TRUE METRIX BLOOD GLUCOSE TEST) test strip Use 3 times daily before meals 100 each 12  . insulin aspart (NOVOLOG) 100 UNIT/ML injection Inject 0-15 Units into the skin 3 (three) times daily with meals. Sliding scale  CBG 70 - 120: 0 units: CBG 121 - 140: 2 units; CBG 140 - 200: 4 units; CBG 201 - 240: 6 units; CBG 240 - 300: 8 units;CBG 300 - 350: 12 units; CBG 351 - 400: 16 units; CBG > 400 : 16 units and notify your  MD 10 mL 3  . Insulin Glargine (LANTUS SOLOSTAR) 100 UNIT/ML Solostar Pen Inject 55 Units into the skin daily at 10 pm. 5 pen 3  . lactulose (CHRONULAC) 10 GM/15ML solution Take 15 mLs (10 g total) by mouth 2 (two) times daily as needed for mild constipation. 946 mL 1  . potassium chloride (K-DUR) 10 MEQ tablet Take 1 tablet (10 mEq total) by mouth as directed. Take 1 tab twice daily for 3 days; then change to 1 tab once a day 100 tablet 3  . sacubitril-valsartan (ENTRESTO) 24-26 MG Take 1 tablet by mouth 2 (two) times daily. 60 tablet 11  . TRUEPLUS LANCETS 28G MISC 1 each by Does not apply route 3 (three) times daily before meals. 100 each 12  . atorvastatin (LIPITOR) 80 MG tablet Take 1 tablet (80 mg total) by mouth daily at 6 PM. 30 tablet 3  . benzonatate (TESSALON) 100 MG capsule Take 1 capsule (100 mg total) by mouth 2 (two) times daily as needed for cough. (Patient not taking: Reported on 03/22/2017) 20 capsule 0   No facility-administered medications prior to visit.     ROS Review of Systems  Constitutional: Negative for activity change and appetite change.  HENT: Negative for sinus pressure and sore throat.   Eyes: Negative for visual disturbance.  Respiratory: Positive for cough. Negative for chest tightness and shortness of breath.   Cardiovascular: Positive for leg swelling. Negative for chest pain.    Gastrointestinal: Negative for abdominal distention, abdominal pain, constipation and diarrhea.  Endocrine: Negative.   Genitourinary: Negative for dysuria.  Musculoskeletal: Negative for joint swelling and myalgias.  Skin: Negative for rash.  Allergic/Immunologic: Negative.   Neurological: Negative for weakness, light-headedness and numbness.  Psychiatric/Behavioral: Negative for dysphoric mood and suicidal ideas.    Objective:  BP (!) 147/88 (BP Location: Right Arm, Patient Position: Sitting, Cuff Size: Small)   Pulse 90   Temp 97.9 F (36.6 C) (Oral)   Ht '5\' 7"'  (1.702 m)   Wt 201 lb (91.2 kg)   BMI 31.48 kg/m  BP/Weight 03/22/2017 05/19/320 01/14/4824  Systolic BP 003 704 888  Diastolic BP 88 84 88  Wt. (Lbs) 201 198.6 201  BMI 31.48 31.11 31.48      Physical Exam Constitutional: He is oriented to person, place, and time. He appears well-developed and well-nourished.  Neck: No JVD present.  Cardiovascular: Normal rate, normal heart sounds and intact distal pulses. No pedal edema   No murmur heard. Pulmonary/Chest: Effort normal and breath sounds normal. He has no wheezes. He has no rales. He exhibits no tenderness.  Abdominal: Soft. Bowel sounds are normal. He exhibits no distension and no mass. There is no tenderness.  Musculoskeletal: Transmetatarsal amputation of right foot  Neurological: He is alert and oriented to person, place, and time.  Skin: Skin is warm and dry.  Psychiatric: He has a normal mood and affect.   Lab Results  Component Value Date   HGBA1C 11.8 (H) 01/29/2017    Assessment & Plan:   1. Uncontrolled type 2 diabetes mellitus with hyperglycemia, with long-term current use of insulin (HCC) Uncontrolled with A1c of 11.8 Blood sugar log reveals improvement - Glucose (CBG)  2. Cough Could be from CHF - benzonatate (TESSALON) 100 MG capsule; Take 1 capsule (100 mg total) by mouth 3 (three) times daily as needed for cough.  Dispense: 30 capsule;  Refill: 1  3. NICM (nonischemic cardiomyopathy) ([place]) EF 20-25% Need evaluation for ICD down the road Cardiology.  4. Chronic systolic CHF (congestive heart failure) (HCC) Euvolemic, weight is stable Advised to take an extra pill of 40 mg in the event that he develops pedal edema Low-sodium, heart healthy diet Continue Lasix, Entresto, beta blocker We'll check renal function - CMP14+EGFR  5. Essential hypertension Slightly elevated above goal of less than 130/80 Low-sodium diet No regimen changes today   Meds ordered this encounter  Medications  . atorvastatin (LIPITOR) 80 MG tablet    Sig: Take 1 tablet (80 mg total) by mouth daily at 6 PM.    Dispense:  30 tablet    Refill:  3  . benzonatate (TESSALON) 100 MG capsule    Sig: Take 1 capsule (100 mg total) by mouth 3 (three) times daily as needed for cough.    Dispense:  30 capsule    Refill:  1    Follow-up: No Follow-up on file.   Arnoldo Morale MD

## 2017-03-23 LAB — CMP14+EGFR
ALT: 14 IU/L (ref 0–44)
AST: 16 IU/L (ref 0–40)
Albumin/Globulin Ratio: 1.1 — ABNORMAL LOW (ref 1.2–2.2)
Albumin: 3.7 g/dL (ref 3.5–5.5)
Alkaline Phosphatase: 94 IU/L (ref 39–117)
BUN/Creatinine Ratio: 18 (ref 9–20)
BUN: 15 mg/dL (ref 6–24)
Bilirubin Total: 0.3 mg/dL (ref 0.0–1.2)
CALCIUM: 9.2 mg/dL (ref 8.7–10.2)
CO2: 23 mmol/L (ref 18–29)
CREATININE: 0.84 mg/dL (ref 0.76–1.27)
Chloride: 103 mmol/L (ref 96–106)
GFR calc Af Amer: 116 mL/min/{1.73_m2} (ref >59)
GFR, EST NON AFRICAN AMERICAN: 101 mL/min/{1.73_m2} (ref >59)
Globulin, Total: 3.5 g/dL (ref 1.5–4.5)
Glucose: 177 mg/dL — ABNORMAL HIGH (ref 65–99)
Potassium: 3.9 mmol/L (ref 3.5–5.2)
Sodium: 141 mmol/L (ref 134–144)
Total Protein: 7.2 g/dL (ref 6.0–8.5)

## 2017-03-23 MED FILL — BENZONATATE 100 MG CAPSULE: 100 | 10 days supply | Qty: 30 | Fill #0

## 2017-04-03 ENCOUNTER — Ambulatory Visit: Payer: Self-pay | Admitting: Physician Assistant

## 2017-04-12 ENCOUNTER — Ambulatory Visit: Payer: Self-pay | Admitting: Physician Assistant

## 2017-04-20 MED FILL — LANTUS SOLOSTAR 100 UNITS/M: 100 | 5 days supply | Qty: 3 | Fill #2

## 2017-04-26 ENCOUNTER — Ambulatory Visit: Payer: Self-pay | Admitting: Physician Assistant

## 2017-04-26 MED FILL — BENZONATATE 100 MG CAPSULE: 100 | 10 days supply | Qty: 30 | Fill #1

## 2017-04-26 MED FILL — FUROSEMIDE 40 MG TABLET: 40 | 30 days supply | Qty: 45 | Fill #1

## 2017-04-26 MED FILL — ENTRESTO 24 MG-26 MG TABLET: 24-26 | 30 days supply | Qty: 60 | Fill #1

## 2017-04-26 MED FILL — POTASSIUM CL 10 MEQ TAB SA: 10 | 60 days supply | Qty: 60 | Fill #1

## 2017-04-26 MED FILL — ATORVASTATIN 80 MG TABLET: 80 | 30 days supply | Qty: 30 | Fill #1

## 2017-04-26 MED FILL — GABAPENTIN 400 MG CAPSULE: 400 | 30 days supply | Qty: 90 | Fill #2

## 2017-04-26 NOTE — Progress Notes (Deleted)
Cardiology Office Note:    Date:  04/26/2017   ID:  Jeffrey [name], DOB 1966-02-26, MRN [id number]  PCP:  Arnoldo Morale, MD  Cardiologist:  Dr. Liam Rogers    Referring MD: Arnoldo Morale, MD   No chief complaint on file. ***  History of Present Illness:    Jeffrey [name] is a 51 y.o. male with a hx of systolic CHF due to NICM, IDDM, HTN, HL, prior toe amputations in the setting of osteomyelitis.    Admitted in 2/18 with acute systolic CHF. Echo demonstrated EF 20-25.  LHC demonstrated moderate nonobstructive disease in the RCA. However, he had no significant CAD to explain his cardiomyopathy.   Last seen 02/15/17 in post hospitalization follow up.  He was volume overloaded.  I adjusted his Lasix and changed his ACE inhibitor to ARNI Delene Loll).  He was to follow up in 2 weeks.  However, he has cancelled/not shown for several visits.  He returns for Cardiology follow up.  ***  Prior CV studies:   The following studies were reviewed today:  Echo 01/29/17 EF 20-25, normal wall motion, mild LAE   LHC 01/31/17 LM normal LAD proximal 10 LCx normal RCA proximal 55, distal 60, RPLB1 60 Mean PA 26, LVEDP 26 EF 10-15   Past Medical History:  Diagnosis Date  . CAD (coronary artery disease)    a. cath 01/31/17: 60% 1st RPLB, 60% dist RCA, 55% prox RCA, 10% pro LAD --> Rx TX.   Marland Kitchen Chronic systolic CHF (congestive heart failure) ([place]) 01/28/2017   1. Echo 01/29/17:  EF 20-25, normal wall motion, mild LAE // 2. EF 10-15 by [facility] 01/2017   . Diabetes mellitus   . Diabetic foot infection ([place]) 03/2016   RT FOOT  . HTN (hypertension)   . Hyperlipidemia   . NICM (nonischemic cardiomyopathy) ([place]) 02/15/2017   1. Mod non-obs CAD on LHC in 01/2017 - CAD does not explain cardiomyopathy    Past Surgical History:  Procedure Laterality Date  . AMPUTATION Right 04/01/2016   Procedure: Right Great Toe Amputation;  Surgeon: Newt Minion, MD;  Location: [place];  Service: Orthopedics;  Laterality:  Right;  . AMPUTATION Right 06/19/2016   Procedure: AMPUTATION SECOND TOE;  Surgeon: Marybelle Killings, MD;  Location: [place];  Service: Orthopedics;  Laterality: Right;  . BACK SURGERY     for abscess  . RIGHT/LEFT HEART CATH AND CORONARY ANGIOGRAPHY N/A 01/31/2017   Procedure: Right/Left Heart Cath and Coronary Angiography;  Surgeon: Troy Sine, MD;  Location: [place] [facility];  Service: Cardiovascular;  Laterality: N/A;    Current Medications: No outpatient prescriptions have been marked as taking for the 04/26/17 encounter (Appointment) with [name] T, PA-C.     Allergies:   Patient has no known allergies.   Social History   Social History  . Marital status: Significant Other    Spouse name: N/A  . Number of children: N/A  . Years of education: N/A   Social History Main Topics  . Smoking status: Never Smoker  . Smokeless tobacco: Never Used  . Alcohol use No  . Drug use: No  . Sexual activity: Not on file   Other Topics Concern  . Not on file   Social History Narrative  . No narrative on file     Family Hx: The patient's family history includes Diabetes in his father, mother, and sister; Heart attack in his father and maternal grandmother; Hypertension in his mother.  ROS:   Please see the history of present illness.    ROS All other systems reviewed and are negative.   EKGs/Labs/Other Test Reviewed:    EKG:  EKG is *** ordered today.  The ekg ordered today demonstrates ***  Recent Labs: 01/28/2017: B Natriuretic Peptide 206.5 01/29/2017: TSH 0.847 01/30/2017: Magnesium 1.8 02/02/2017: Hemoglobin 12.5; Platelets 317 03/22/2017: ALT 14; BUN 15; Creatinine, Ser 0.84; Potassium 3.9; Sodium 141   Recent Lipid Panel    Component Value Date/Time   CHOL 207 (H) 02/01/2017 0259   TRIG 208 (H) 02/01/2017 0259   HDL 42 02/01/2017 0259   CHOLHDL 4.9 02/01/2017 0259   VLDL 42 (H) 02/01/2017 0259   LDLCALC 123 (H) 02/01/2017 0259     Physical Exam:    VS:   There were no vitals taken for this visit.    Wt Readings from Last 3 Encounters:  03/22/17 201 lb (91.2 kg)  02/15/17 201 lb (91.2 kg)  02/15/17 198 lb 9.6 oz (90.1 kg)     ***Physical Exam  ASSESSMENT:    No diagnosis found. PLAN:    In order of problems listed above:  No diagnosis found.*** 1. Acute on chronic systolic CHF (congestive heart failure) ([place]) - He is volume overloaded.  Weight is up 6 lbs since DC.  He is currently tolerating Coreg and Lisinopril.  He is NYHA 2b-3 with an EF 20-25. He would benefit from [place].  His pharmacy will cover it for $10 per 90 days.             -  Increase Lasix to 40 mg bid x 3 days, then reduce to 60 mg QD             -  K+ 20 mEq QD x 3 days, then reduce to 10 mEq QD             -  DC Lisinopril              -  After 36 hours, start Entresto 24/26 mg bid             -  BMET 1 week   2. NICM (nonischemic cardiomyopathy) ([place]) - After 3 mos of Rx, repeat Echo. If EF < 35, refer to EP for ICD.   3. Coronary artery disease involving native coronary artery of native heart without angina pectoris - No angina.  He had mod non-obs disease on LHC.  Continue ASA, statin.   4. Essential hypertension - BP is controlled.     5. Hyperlipidemia, unspecified hyperlipidemia type - Continue statin.   6. Diabetes Mellitus - FU with PCP for strict control.   Dispo:  No Follow-up on file.   Medication Adjustments/Labs and Tests Ordered: Current medicines are reviewed at length with the patient today.  Concerns regarding medicines are outlined above.  Orders/Tests:  No orders of the defined types were placed in this encounter.  Medication changes: No orders of the defined types were placed in this encounter.  Signed, [name], PA-C  04/26/2017 11:59 AM    [facility], [place], [place]  [address] Phone: [phone]; Fax: [phone]

## 2017-05-04 ENCOUNTER — Encounter: Payer: Self-pay | Admitting: Physician Assistant

## 2017-05-05 MED FILL — !LANTUS SOLOSTAR 100UNITS/M: 100 | 10 days supply | Qty: 6 | Fill #3

## 2017-05-30 ENCOUNTER — Other Ambulatory Visit: Payer: Self-pay | Admitting: Family Medicine

## 2017-05-30 DIAGNOSIS — R059 Cough, unspecified: Secondary | ICD-10-CM

## 2017-05-30 DIAGNOSIS — R05 Cough: Secondary | ICD-10-CM

## 2017-07-04 MED FILL — ATORVASTATIN 80 MG TABLET: 80 | 30 days supply | Qty: 30 | Fill #2

## 2017-07-04 MED FILL — BENZONATATE 100 MG CAPSULE: 100 | 10 days supply | Qty: 30 | Fill #0

## 2017-07-04 MED FILL — FUROSEMIDE 40 MG TABLET: 40 | 30 days supply | Qty: 45 | Fill #2

## 2017-07-04 MED FILL — GABAPENTIN 400 MG CAPS: 400 | 30 days supply | Qty: 90 | Fill #3

## 2017-09-05 ENCOUNTER — Encounter (HOSPITAL_COMMUNITY): Payer: Self-pay | Admitting: Emergency Medicine

## 2017-09-05 ENCOUNTER — Emergency Department (HOSPITAL_COMMUNITY)
Admission: EM | Admit: 2017-09-05 | Discharge: 2017-09-06 | Disposition: A | Discharge status: Home / Self Care | Payer: Self-pay | Attending: Emergency Medicine | Admitting: Emergency Medicine

## 2017-09-05 ENCOUNTER — Emergency Department (HOSPITAL_COMMUNITY): Payer: Self-pay

## 2017-09-05 DIAGNOSIS — Z79899 Other long term (current) drug therapy: Secondary | ICD-10-CM | POA: Insufficient documentation

## 2017-09-05 DIAGNOSIS — L02415 Cutaneous abscess of right lower limb: Secondary | ICD-10-CM | POA: Insufficient documentation

## 2017-09-05 DIAGNOSIS — I110 Hypertensive heart disease with heart failure: Secondary | ICD-10-CM | POA: Insufficient documentation

## 2017-09-05 DIAGNOSIS — E1165 Type 2 diabetes mellitus with hyperglycemia: Secondary | ICD-10-CM | POA: Insufficient documentation

## 2017-09-05 DIAGNOSIS — I5022 Chronic systolic (congestive) heart failure: Secondary | ICD-10-CM | POA: Insufficient documentation

## 2017-09-05 DIAGNOSIS — L0291 Cutaneous abscess, unspecified: Secondary | ICD-10-CM

## 2017-09-05 DIAGNOSIS — Z794 Long term (current) use of insulin: Secondary | ICD-10-CM | POA: Insufficient documentation

## 2017-09-05 DIAGNOSIS — I2510 Atherosclerotic heart disease of native coronary artery without angina pectoris: Secondary | ICD-10-CM | POA: Insufficient documentation

## 2017-09-05 DIAGNOSIS — R739 Hyperglycemia, unspecified: Secondary | ICD-10-CM

## 2017-09-05 LAB — CBC
HCT: 36.2 % — ABNORMAL LOW (ref 39.0–52.0)
Hemoglobin: 11.8 g/dL — ABNORMAL LOW (ref 13.0–17.0)
MCH: 24.7 pg — ABNORMAL LOW (ref 26.0–34.0)
MCHC: 32.6 g/dL (ref 30.0–36.0)
MCV: 75.7 fL — AB (ref 78.0–100.0)
PLATELETS: 221 10*3/uL (ref 150–400)
RBC: 4.78 MIL/uL (ref 4.22–5.81)
RDW: 14.2 % (ref 11.5–15.5)
WBC: 6.8 10*3/uL (ref 4.0–10.5)

## 2017-09-05 LAB — CBG MONITORING, ED
Glucose-Capillary: 479 mg/dL — ABNORMAL HIGH (ref 65–99)
Glucose-Capillary: 357 mg/dL — ABNORMAL HIGH (ref 65–99)
Glucose-Capillary: 341 mg/dL — ABNORMAL HIGH (ref 65–99)
Glucose-Capillary: 272 mg/dL — ABNORMAL HIGH (ref 65–99)

## 2017-09-05 LAB — BASIC METABOLIC PANEL
Anion gap: 8 (ref 5–15)
BUN: 9 mg/dL (ref 6–20)
CO2: 25 mmol/L (ref 22–32)
CREATININE: 0.97 mg/dL (ref 0.61–1.24)
Calcium: 8.7 mg/dL — ABNORMAL LOW (ref 8.9–10.3)
Chloride: 99 mmol/L — ABNORMAL LOW (ref 101–111)
GFR calc Af Amer: >60 mL/min (ref >60)
GLUCOSE: 517 mg/dL — AB (ref 65–99)
Potassium: 3.9 mmol/L (ref 3.5–5.1)
SODIUM: 132 mmol/L — AB (ref 135–145)

## 2017-09-05 LAB — URINALYSIS, ROUTINE W REFLEX MICROSCOPIC
Bilirubin Urine: NEGATIVE
KETONES UR: NEGATIVE mg/dL
NITRITE: NEGATIVE
PH: 5 (ref 5.0–8.0)
Specific Gravity, Urine: 1.039 — ABNORMAL HIGH (ref 1.005–1.030)

## 2017-09-05 MED ORDER — SODIUM CHLORIDE 0.9 % IV BOLUS (SEPSIS)
1000.0000 mL | Freq: Once | INTRAVENOUS | Status: AC
  Administered 2017-09-05: 1000 mL via INTRAVENOUS

## 2017-09-05 MED ORDER — LIDOCAINE-EPINEPHRINE (PF) 2 %-1:200000 IJ SOLN
10.0000 mL | Freq: Once | INTRAMUSCULAR | Status: AC
  Administered 2017-09-05: 10 mL
  Filled 2017-09-05: qty 20

## 2017-09-05 MED ORDER — DOXYCYCLINE HYCLATE 100 MG PO CAPS: 100.0000 mg | ORAL_CAPSULE | Freq: Two times a day (BID) | ORAL | 0 refills | Status: DC

## 2017-09-05 MED ORDER — DOXYCYCLINE HYCLATE 100 MG PO TABS
100.0000 mg | ORAL_TABLET | Freq: Once | ORAL | Status: AC
  Administered 2017-09-05: 100 mg via ORAL
  Filled 2017-09-05: qty 1

## 2017-09-05 MED ORDER — INSULIN ASPART 100 UNIT/ML ~~LOC~~ SOLN
8.0000 [IU] | Freq: Once | SUBCUTANEOUS | Status: AC
  Administered 2017-09-05: 8 [IU] via SUBCUTANEOUS
  Filled 2017-09-05: qty 1

## 2017-09-05 MED ORDER — CEPHALEXIN 500 MG PO CAPS: 500.0000 mg | ORAL_CAPSULE | Freq: Four times a day (QID) | ORAL | 0 refills | Status: DC

## 2017-09-05 MED ORDER — CEPHALEXIN 500 MG PO CAPS
500.0000 mg | ORAL_CAPSULE | Freq: Once | ORAL | Status: AC
  Administered 2017-09-05: 500 mg via ORAL
  Filled 2017-09-05: qty 1

## 2017-09-05 MED ORDER — SACUBITRIL-VALSARTAN 24-26 MG PO TABS
1.0000 | ORAL_TABLET | Freq: Once | ORAL | Status: AC
  Administered 2017-09-05: 1 via ORAL
  Filled 2017-09-05 (×2): qty 1

## 2017-09-05 MED ORDER — FUROSEMIDE 10 MG/ML IJ SOLN
40.0000 mg | Freq: Once | INTRAMUSCULAR | Status: AC
  Administered 2017-09-05: 40 mg via INTRAVENOUS
  Filled 2017-09-05: qty 4

## 2017-09-05 MED ORDER — INSULIN ASPART 100 UNIT/ML ~~LOC~~ SOLN: 10.0000 [IU] | Freq: Once | SUBCUTANEOUS | Status: DC

## 2017-09-05 MED ORDER — MORPHINE SULFATE (PF) 4 MG/ML IV SOLN
4.0000 mg | Freq: Once | INTRAVENOUS | Status: AC
  Administered 2017-09-05: 4 mg via INTRAVENOUS
  Filled 2017-09-05: qty 1

## 2017-09-05 NOTE — ED Triage Notes (Signed)
Patient c/o right knot on right hip for couple days,. Patient denies any drainage but is painful.   Patient also thinks his sugar is high. Patient reports hasnt been checking his sugars and last use of insulin was "probably week ago".

## 2017-09-05 NOTE — ED Notes (Signed)
Bed: WA09 Expected date:  Expected time:  Means of arrival:  Comments: 

## 2017-09-05 NOTE — ED Notes (Signed)
Patient transported to x-ray. ?

## 2017-09-05 NOTE — Discharge Instructions (Signed)
Your blood sugars have improved in the ED. Please make sure you take your night insulin when you get home. It is important that you take your insulin and blood pressure medicine as prescribed every day. Follow-up with primary care doctor. Wound recheck in 2-3 days. Return sooner if symptoms worsen.  Please take your medications as prescribed. If you develop any sob, cp or leg swelling return to the ED.      You have been treated for an abscess in the ED.   There are sign of surrounding infection. Please take all of your antibiotics until finished!   You may develop abdominal discomfort or diarrhea from the antibiotic. You may help offset this with probiotics which you can buy or get in yogurt.   May take tylenol and motrin as needed for pain. Warm compress to the area to help with the healing process. Avoid swimming for 1 week. May soak in warm water to help with pain and the healing process.   Follow up with your doctor, an urgent care, or return to ED in order to remove your packing in 48-72 hours. If you do not have packing return in 48-72 hours for wound recheck. Return to the emergency department if you develop a fever, your abscess appears to become more infected (growing surrounding redness and warmth), new or worsening symptoms develop, any additional concerns.   Abscess An abscess (boil or furuncle) is an infected area that contains a collection of pus.   SYMPTOMS Signs and symptoms of an abscess include pain, tenderness, redness, or hardness. You may feel a moveable soft area under your skin. An abscess can occur anywhere in the body.   TREATMENT  A surgical cut (incision) may be made over your abscess to drain the pus. Gauze may be packed into the space or a drain may be looped through the abscess cavity (pocket). This provides a drain that will allow the cavity to heal from the inside outwards. The abscess may be painful for a few days, but should feel much better if it was drained.   Your abscess, if seen early, may not have localized and may not have been drained. If not, another appointment may be required if it does not get better on its own or with medications.  HOME CARE INSTRUCTIONS  Keep the skin and clothes clean around your abscess.  If the abscess was drained, you will need to use gauze dressing to collect any draining pus. Dressings will typically need to be changed 3 or more times a day.  The infection may spread by skin contact with others. Avoid skin contact as much as possible.  Practice good hygiene. This includes regular hand washing, cover any draining skin lesions, and do not share personal care items.  SEEK MEDICAL CARE IF:  You develop increased pain, swelling, redness, drainage, or bleeding in the wound site.  You develop signs of generalized infection including muscle aches, chills, fever, or a general ill feeling.  You have an oral temperature above 102 F (38.9 C).  MAKE SURE YOU:  Understand these instructions.  Will watch your condition.  Will get help right away if you are not doing well or get worse.  Document Released: 09/07/2005 Document Revised: 08/10/2011 Document Reviewed: 07/01/2008 [facility] Patient Information 2012 [place].

## 2017-09-05 NOTE — ED Notes (Signed)
Lab called, pt has critical glucose of 517

## 2017-09-05 NOTE — ED Provider Notes (Signed)
[facility] DEPT Provider Note   CSN: [id number] Arrival date & time: 09/05/17  1215     History   Chief Complaint Chief Complaint  Patient presents with  . knot on right hip area  . Hyperglycemia    HPI Jeffrey [name] is a 51 y.o. male.  HPI 51 year old African-American male past medical history significant for CAD poor ef of 15%, diabetes, hypertension that presents to the emergency Department today with complaints of hyperglycemia and abscess. Patient states that he noticed the bump on his right thigh over the past 2-3 days. It is become more painful. He denies any drainage. Denies being bitten by anything. Patient reports history of abscesses that have required drainage. Patient denies any associated fever or chills. Does report some mild nausea. Patient states the bump is painful. He is able to ambulate however causes him pain. He has not taken anything for the pain prior to arrival. Patient also complains of hyperglycemia. Patient has not been compliant with his medication for the past week. Patient states that his blood sugars have been high however he has not taken his blood sugar. Patient also states he has not been compliant with his Lasix and blood pressure medicine.  Pt denies any fever, chill, ha, vision changes, lightheadedness, dizziness, congestion, neck pain, cp, sob, cough, abd pain, urinary symptoms, change in bowel habits, melena, hematochezia, lower extremity paresthesias.  Past Medical History:  Diagnosis Date  . CAD (coronary artery disease)    a. cath 01/31/17: 60% 1st RPLB, 60% dist RCA, 55% prox RCA, 10% pro LAD --> Rx TX.   Marland Kitchen Chronic systolic CHF (congestive heart failure) ([place]) 01/28/2017   1. Echo 01/29/17:  EF 20-25, normal wall motion, mild LAE // 2. EF 10-15 by [facility] 01/2017   . Diabetes mellitus   . Diabetic foot infection ([place]) 03/2016   RT FOOT  . HTN (hypertension)   . Hyperlipidemia   . NICM (nonischemic cardiomyopathy) ([place]) 02/15/2017   1. Mod  non-obs CAD on LHC in 01/2017 - CAD does not explain cardiomyopathy    Patient Active Problem List   Diagnosis Date Noted  . NICM (nonischemic cardiomyopathy) ([place]) 02/15/2017  . Coronary artery disease involving native coronary artery of native heart without angina pectoris 02/15/2017  . Hyperlipidemia 02/15/2017  . Chronic systolic CHF (congestive heart failure) ([place]) 01/28/2017  . Abscess, gluteal, right 09/12/2016  . Folliculitis 94/49/6759  . 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 Anemia, iron deficiency   . Noncompliance with medication regimen 03/30/2016  . Uncontrolled type 2 diabetes mellitus with hyperglycemia, with long-term current use of insulin ([place])   . Diabetic foot infection ([place]) 02/18/2016  . Cellulitis 09/25/2015  . Essential hypertension 08/21/2013  . Diabetic neuropathy ([place]) 08/21/2013  . Fungal toenail infection 08/21/2013    Past Surgical History:  Procedure Laterality Date  . AMPUTATION Right 04/01/2016   Procedure: Right Great Toe Amputation;  Surgeon: Newt Minion, MD;  Location: [place];  Service: Orthopedics;  Laterality: Right;  . AMPUTATION Right 06/19/2016   Procedure: AMPUTATION SECOND TOE;  Surgeon: Marybelle Killings, MD;  Location: [place];  Service: Orthopedics;  Laterality: Right;  . BACK SURGERY     for abscess  . RIGHT/LEFT HEART CATH AND CORONARY ANGIOGRAPHY N/A 01/31/2017   Procedure: Right/Left Heart Cath and Coronary Angiography;  Surgeon: Troy Sine, MD;  Location: [facility];  Service: Cardiovascular;  Laterality: N/A;  Home Medications    Prior to Admission medications   Medication Sig Start Date End Date Taking? Authorizing Provider  albuterol (PROVENTIL HFA;VENTOLIN HFA) 108 (90 Base) MCG/ACT inhaler Inhale 1-2 puffs into the lungs every 6 (six) hours as needed for wheezing or shortness of breath. 11/15/16  Yes Recardo Evangelist, PA-C  atorvastatin (LIPITOR) 80 MG tablet Take 1 tablet  (80 mg total) by mouth daily at 6 PM. 03/22/17  Yes Arnoldo Morale, MD  carvedilol (COREG) 6.25 MG tablet Take 1 tablet (6.25 mg total) by mouth 2 (two) times daily with a meal. 02/02/17  Yes Bonnielee Haff, MD  ferrous sulfate 325 (65 FE) MG tablet Take 1 tablet (325 mg total) by mouth 2 (two) times daily with a meal. 04/03/16  Yes Dhungel, Nishant, MD  gabapentin (NEURONTIN) 400 MG capsule Take 1 capsule (400 mg total) by mouth 3 (three) times daily. 09/28/16  Yes Arnoldo Morale, MD  insulin aspart (NOVOLOG) 100 UNIT/ML injection Inject 0-15 Units into the skin 3 (three) times daily with meals. Sliding scale  CBG 70 - 120: 0 units: CBG 121 - 140: 2 units; CBG 140 - 200: 4 units; CBG 201 - 240: 6 units; CBG 240 - 300: 8 units;CBG 300 - 350: 12 units; CBG 351 - 400: 16 units; CBG > 400 : 16 units and notify your  MD 02/15/17  Yes Arnoldo Morale, MD  Insulin Glargine (LANTUS SOLOSTAR) 100 UNIT/ML Solostar Pen Inject 55 Units into the skin daily at 10 pm. 02/15/17  Yes Amao, Charlane Ferretti, MD  sacubitril-valsartan (ENTRESTO) 24-26 MG Take 1 tablet by mouth 2 (two) times daily. 02/15/17  Yes [name] T, PA-C  aspirin EC 81 MG tablet Take 1 tablet (81 mg total) by mouth daily. Patient not taking: Reported on 09/05/2017 02/02/17   Bonnielee Haff, MD  Blood Glucose Monitoring Suppl (TRUE METRIX METER) DEVI 1 each by Does not apply route 3 (three) times daily before meals. 02/15/17   Arnoldo Morale, MD  cephALEXin (KEFLEX) 500 MG capsule Take 1 capsule (500 mg total) by mouth 4 (four) times daily. 09/05/17   Doristine Devoid, PA-C  doxycycline (VIBRAMYCIN) 100 MG capsule Take 1 capsule (100 mg total) by mouth 2 (two) times daily. 09/05/17   Doristine Devoid, PA-C  furosemide (LASIX) 40 MG tablet Take 1 tablet (40 mg total) by mouth as directed. Take 1 tab twice daily for 3 days; then change to 1 and 1/2 tabs = 60 mg once a day 02/15/17 05/16/17  [name] T, PA-C  glucose blood (TRUE METRIX BLOOD GLUCOSE TEST) test strip  Use 3 times daily before meals 02/15/17   Arnoldo Morale, MD  potassium chloride (K-DUR) 10 MEQ tablet Take 1 tablet (10 mEq total) by mouth as directed. Take 1 tab twice daily for 3 days; then change to 1 tab once a day 02/15/17 05/16/17  [name] T, PA-C  TRUEPLUS LANCETS 28G MISC 1 each by Does not apply route 3 (three) times daily before meals. 02/15/17   Arnoldo Morale, MD    Family History Family History  Problem Relation Age of Onset  . Diabetes Mother   . Hypertension Mother   . Diabetes Father   . Heart attack Father   . Diabetes Sister   . Heart attack Maternal Grandmother     Social History Social History  Substance Use Topics  . Smoking status: Never Smoker  . Smokeless tobacco: Never Used  . Alcohol use No  Allergies   Patient has no known allergies.   Review of Systems Review of Systems  Constitutional: Negative for chills and fever.  HENT: Negative for congestion and sore throat.   Eyes: Negative for visual disturbance.  Respiratory: Negative for cough and shortness of breath.   Cardiovascular: Negative for chest pain.  Gastrointestinal: Positive for nausea. Negative for abdominal pain, diarrhea and vomiting.  Genitourinary: Negative for dysuria, flank pain, frequency, hematuria, scrotal swelling, testicular pain and urgency.  Musculoskeletal: Negative for arthralgias and myalgias.  Skin: Positive for color change and wound. Negative for rash.  Neurological: Negative for dizziness, syncope, weakness, light-headedness, numbness and headaches.  Psychiatric/Behavioral: Negative for sleep disturbance. The patient is not nervous/anxious.      Physical Exam Updated Vital Signs BP (!) 150/90   Pulse 86   Temp 99.1 F (37.3 C) (Oral)   Resp 16   Ht 5\' 7"  (1.702 m)   Wt 89.8 kg (198 lb)   SpO2 98%   BMI 31.01 kg/m   Physical Exam  Constitutional: He is oriented to person, place, and time. He appears well-developed and well-nourished.  Non-toxic  appearance. No distress.  HENT:  Head: Normocephalic and atraumatic.  Mouth/Throat: Oropharynx is clear and moist.  Eyes: Pupils are equal, round, and reactive to light. Conjunctivae are normal. Right eye exhibits no discharge. Left eye exhibits no discharge.  Neck: Normal range of motion. Neck supple.  Cardiovascular: Normal rate, regular rhythm, normal heart sounds and intact distal pulses.  Exam reveals no gallop and no friction rub.   No murmur heard. Pulmonary/Chest: Effort normal. No respiratory distress.  Abdominal: Soft. Bowel sounds are normal. He exhibits no distension. There is no tenderness. There is no rigidity, no rebound, no guarding, no CVA tenderness, no tenderness at McBurney's point and negative Murphy's sign.  Musculoskeletal: Normal range of motion. He exhibits no tenderness.  Normal range of motion of lower extremity is. Full range of motion of all joints without any pain, erythema, warmth.  Lymphadenopathy:    He has no cervical adenopathy.  Neurological: He is alert and oriented to person, place, and time.  Strength in lower extremities 5 out of 5.  Skin: Skin is warm and dry. Capillary refill takes less than 2 seconds. No rash noted.  Patient with one similar area of fluctuance to the right lateral thigh with approximately 6 cm area of warmth, erythema and induration concerning for abscess and cellulitis. Patient with small ulcerative lesion in the center of the induration. No purulent drainage. Tender to palpation.  Psychiatric: His behavior is normal. Judgment and thought content normal.  Nursing note and vitals reviewed.    ED Treatments / Results  Labs (all labs ordered are listed, but only abnormal results are displayed) Labs Reviewed  BASIC METABOLIC PANEL - Abnormal; Notable for the following:       Result Value   Sodium 132 (*)    Chloride 99 (*)    Glucose, Bld 517 (*)    Calcium 8.7 (*)    All other components within normal limits  CBC - Abnormal;  Notable for the following:    Hemoglobin 11.8 (*)    HCT 36.2 (*)    MCV 75.7 (*)    MCH 24.7 (*)    All other components within normal limits  URINALYSIS, ROUTINE W REFLEX MICROSCOPIC - Abnormal; Notable for the following:    Specific Gravity, Urine 1.039 (*)    Glucose, UA >=500 (*)    Hgb urine dipstick  MODERATE (*)    Protein, ur >=300 (*)    Leukocytes, UA SMALL (*)    Bacteria, UA RARE (*)    Squamous Epithelial / LPF 0-5 (*)    All other components within normal limits  CBG MONITORING, ED - Abnormal; Notable for the following:    Glucose-Capillary 479 (*)    All other components within normal limits  CBG MONITORING, ED - Abnormal; Notable for the following:    Glucose-Capillary 357 (*)    All other components within normal limits  CBG MONITORING, ED - Abnormal; Notable for the following:    Glucose-Capillary 341 (*)    All other components within normal limits  CBG MONITORING, ED - Abnormal; Notable for the following:    Glucose-Capillary 272 (*)    All other components within normal limits  URINE CULTURE    EKG  EKG Interpretation None       Radiology Dg Chest 2 View  Result Date: 09/05/2017 CLINICAL DATA:  Cough. Shortness of breath. Intermittent symptoms for 3 weeks. Clinical concern for pulmonary edema. EXAM: CHEST  2 VIEW COMPARISON:  Radiograph 01/28/2017 FINDINGS: The cardiomediastinal contours are unchanged with stable mild cardiomegaly. Coronary stent in place. Decreased pulmonary edema from prior exam with mild residual vascular congestion. Streaky left lung base scarring. No consolidation, pleural effusion, or pneumothorax. No acute osseous abnormalities are seen. IMPRESSION: Cardiomegaly with vascular congestion, improved pulmonary edema from prior exam. Electronically Signed   By: Jeb Levering M.D.   On: 09/05/2017 23:29    Procedures .Marland KitchenIncision and Drainage Date/Time: 09/06/2017 12:36 AM Performed by: Doristine Devoid Authorized by: Ocie Cornfield T   Consent:    Consent obtained:  Verbal   Consent given by:  Patient   Risks discussed:  Bleeding, damage to other organs, incomplete drainage, infection and pain   Alternatives discussed:  No treatment Location:    Type:  Abscess   Size:  6 cm   Location:  Lower extremity   Lower extremity location:  Leg   Leg location:  R upper leg Pre-procedure details:    Skin preparation:  Betadine Anesthesia (see MAR for exact dosages):    Anesthesia method:  Local infiltration   Local anesthetic:  Lidocaine 1% WITH epi Procedure type:    Complexity:  Simple Procedure details:    Incision types:  Cruciate   Scalpel blade:  11   Wound management:  Probed and deloculated, irrigated with saline and extensive cleaning   Drainage:  Bloody and purulent   Drainage amount:  Copious   Wound treatment:  Wound left open   Packing materials:  1/4 in iodoform gauze Post-procedure details:    Patient tolerance of procedure:  Tolerated well, no immediate complications   (including critical care time)  Medications Ordered in ED Medications  sodium chloride 0.9 % bolus 1,000 mL (0 mLs Intravenous Stopped 09/05/17 2236)  lidocaine-EPINEPHrine (XYLOCAINE W/EPI) 2 %-1:200000 (PF) injection 10 mL (10 mLs Infiltration Given by Other 09/05/17 2103)  insulin aspart (novoLOG) injection 8 Units (8 Units Subcutaneous Given 09/05/17 2102)  sacubitril-valsartan (ENTRESTO) 24-26 mg per tablet (1 tablet Oral Given 09/05/17 2311)  morphine 4 MG/ML injection 4 mg (4 mg Intravenous Given 09/05/17 2200)  cephALEXin (KEFLEX) capsule 500 mg (500 mg Oral Given 09/05/17 2245)  doxycycline (VIBRA-TABS) tablet 100 mg (100 mg Oral Given 09/05/17 2245)  furosemide (LASIX) injection 40 mg (40 mg Intravenous Given 09/05/17 2325)     Initial Impression / Assessment and Plan / ED  Course  I have reviewed the triage vital signs and the nursing notes.  Pertinent labs & imaging results that were available during my care of the  patient were reviewed by me and considered in my medical decision making (see chart for details).     Patient reports to the ED with complaints of abscess to his right lateral thigh. Patient also complains of hyperglycemia. Patient has been noncompliant with his medication over the past week. Patient will not give me more commission why he has been noncompliant with his medications.  Vital signs are reassuring. Patient is afebrile. No tachycardia noted. Patient is not hypoxic. Patient is hypertensive however he has not taken his blood pressure medicine for the past 2-3 days. Denies any associated chest pain, shortness of breath, headache, vision changes.  Lab work is reassuring. No leukocytosis. Hemoglobin is stable. Patient does have hyperglycemia 517. Sodium corrected is normal. Kidney function is normal. Normal BUN. Urine shows small leukocytes, WBCs, rare bacteria, moderate hemoglobin. Patient denies any urinary symptoms. We'll culture.  On exam patient does have abscess to the right lateral thigh. I&D was performed. Copious amounts of purulent drainage was expressed. Abscess was packed and dressed after irrigation. Patient will be started on Keflex and doxycycline for double coverage given history of diabetes and poor compliance with insulin. This will cover for any uti as well. Pt does not meet SIRS or SEPSIS criteria. Does not appear to be a deep space infection. Very superficial.  In terms of patient's hyperglycemia the patient was given IV fluids and insulin per protocol. Glucose has improved. Anion gap is normal. Normal bicarbonate. No ketones in urine. Doubt DKA.   Patient has had poor compliance with his Lasix. Patient also has poor EF. Pt was given 1L of fluids. The patient does have some faint crackles noted bibasilarly lungs. IV Lasix was given. X-ray was obtained that shows stable cardiomegaly with vascular congestion and decreased pulmonary edema from prior x-ray. Patient denies any  associated chest pain, shortness of breath, lower show edema. On exam patient has no lower extremity edema. Did not appear to be fluid overload. Saturations are 97% on room air. Patient does not have a cough. Discussed with patient the importance of taking his Lasix appropriate given his poor EF. Have encouraged him to follow up with his PCP. Instruct him to come back to the ED if he develops any shortness of breath, chest pain, lower show edema.  Patient given home dose of blood pressure medicine the ED. Given first dose of antibiotics. Encouraged close follow-up in 2-3 days for wound recheck or sooner if he develops worsening symptoms.  Pt is hemodynamically stable, in NAD, & able to ambulate in the ED. Evaluation does not show pathology that would require ongoing emergent intervention or inpatient treatment. I explained the diagnosis to the patient. Pain has been managed & has no complaints prior to dc. Pt is comfortable with above plan and is stable for discharge at this time. All questions were answered prior to disposition. Strict return precautions for f/u to the ED were discussed. Encouraged follow up with PCP.  Pt was dicussed with Dr. Darl Householder who is agreeable to the above plan.     Final Clinical Impressions(s) / ED Diagnoses   Final diagnoses:  Abscess  Hyperglycemia    New Prescriptions New Prescriptions   CEPHALEXIN (KEFLEX) 500 MG CAPSULE    Take 1 capsule (500 mg total) by mouth 4 (four) times daily.   DOXYCYCLINE (VIBRAMYCIN) 100 MG  CAPSULE    Take 1 capsule (100 mg total) by mouth 2 (two) times daily.     Doristine Devoid, PA-C 09/06/17 0034    Doristine Devoid, PA-C 09/06/17 0037    Drenda Freeze, MD 09/06/17 [phone]

## 2017-09-06 ENCOUNTER — Other Ambulatory Visit: Payer: Self-pay | Admitting: Family Medicine

## 2017-09-06 DIAGNOSIS — E1142 Type 2 diabetes mellitus with diabetic polyneuropathy: Secondary | ICD-10-CM

## 2017-09-06 MED FILL — CEPHALEXIN 500 MG CAPSULE: 500 | 6 days supply | Qty: 24 | Fill #0

## 2017-09-06 MED FILL — ?DOXYCYCLINE HYC 100MG TAB: 100 | 6 days supply | Qty: 12 | Fill #0

## 2017-09-07 LAB — URINE CULTURE: Culture: 10000 — AB

## 2017-09-11 ENCOUNTER — Inpatient Hospital Stay: Payer: Medicaid Other

## 2017-09-15 MED FILL — FUROSEMIDE 40 MG TABLET: 40 | 30 days supply | Qty: 45 | Fill #3

## 2017-09-15 MED FILL — BENZONATATE 100 MG CAPSULE: 100 | 10 days supply | Qty: 30 | Fill #1

## 2017-09-15 MED FILL — ATORVASTATIN 80 MG TABLET: 80 | 30 days supply | Qty: 30 | Fill #3

## 2017-09-15 MED FILL — !NOVOLOG 100UNITS/ML VIAL: 100/ML | 28 days supply | Qty: 10 | Fill #1

## 2017-09-15 MED FILL — !LANTUS SOLOSTAR 100UNITS/M: 100 | 16 days supply | Qty: 6 | Fill #4

## 2017-10-20 ENCOUNTER — Emergency Department (HOSPITAL_COMMUNITY)
Admission: EM | Admit: 2017-10-20 | Discharge: 2017-10-21 | Disposition: A | Discharge status: Home / Self Care | Payer: No Typology Code available for payment source | Attending: Emergency Medicine | Admitting: Emergency Medicine

## 2017-10-20 ENCOUNTER — Other Ambulatory Visit: Payer: Self-pay

## 2017-10-20 ENCOUNTER — Encounter (HOSPITAL_COMMUNITY): Payer: Self-pay | Admitting: *Deleted

## 2017-10-20 ENCOUNTER — Emergency Department (HOSPITAL_COMMUNITY): Payer: No Typology Code available for payment source

## 2017-10-20 DIAGNOSIS — Z041 Encounter for examination and observation following transport accident: Secondary | ICD-10-CM | POA: Diagnosis not present

## 2017-10-20 DIAGNOSIS — Z79899 Other long term (current) drug therapy: Secondary | ICD-10-CM | POA: Diagnosis not present

## 2017-10-20 DIAGNOSIS — Z794 Long term (current) use of insulin: Secondary | ICD-10-CM | POA: Diagnosis not present

## 2017-10-20 DIAGNOSIS — I2510 Atherosclerotic heart disease of native coronary artery without angina pectoris: Secondary | ICD-10-CM | POA: Insufficient documentation

## 2017-10-20 DIAGNOSIS — E1165 Type 2 diabetes mellitus with hyperglycemia: Secondary | ICD-10-CM | POA: Diagnosis present

## 2017-10-20 DIAGNOSIS — I110 Hypertensive heart disease with heart failure: Secondary | ICD-10-CM | POA: Insufficient documentation

## 2017-10-20 DIAGNOSIS — I5022 Chronic systolic (congestive) heart failure: Secondary | ICD-10-CM | POA: Insufficient documentation

## 2017-10-20 DIAGNOSIS — E785 Hyperlipidemia, unspecified: Secondary | ICD-10-CM | POA: Insufficient documentation

## 2017-10-20 DIAGNOSIS — R739 Hyperglycemia, unspecified: Secondary | ICD-10-CM

## 2017-10-20 LAB — COMPREHENSIVE METABOLIC PANEL
ALBUMIN: 3.4 g/dL — AB (ref 3.5–5.0)
ALT: 19 U/L (ref 17–63)
AST: 18 U/L (ref 15–41)
Alkaline Phosphatase: 101 U/L (ref 38–126)
Anion gap: 7 (ref 5–15)
BILIRUBIN TOTAL: 0.4 mg/dL (ref 0.3–1.2)
BUN: 9 mg/dL (ref 6–20)
CHLORIDE: 99 mmol/L — AB (ref 101–111)
CO2: 28 mmol/L (ref 22–32)
CREATININE: 0.86 mg/dL (ref 0.61–1.24)
Calcium: 9.3 mg/dL (ref 8.9–10.3)
GFR calc Af Amer: >60 mL/min (ref >60)
GLUCOSE: 395 mg/dL — AB (ref 65–99)
Potassium: 3.9 mmol/L (ref 3.5–5.1)
Sodium: 134 mmol/L — ABNORMAL LOW (ref 135–145)
Total Protein: 7.5 g/dL (ref 6.5–8.1)

## 2017-10-20 LAB — URINALYSIS, ROUTINE W REFLEX MICROSCOPIC
Bilirubin Urine: NEGATIVE
KETONES UR: NEGATIVE mg/dL
LEUKOCYTES UA: NEGATIVE
Nitrite: NEGATIVE
PH: 6 (ref 5.0–8.0)
Protein, ur: 100 mg/dL — AB
SPECIFIC GRAVITY, URINE: 1.032 — AB (ref 1.005–1.030)

## 2017-10-20 LAB — CBG MONITORING, ED: Glucose-Capillary: 362 mg/dL — ABNORMAL HIGH (ref 65–99)

## 2017-10-20 LAB — CBC
HCT: 40.4 % (ref 39.0–52.0)
Hemoglobin: 12.8 g/dL — ABNORMAL LOW (ref 13.0–17.0)
MCH: 24.6 pg — ABNORMAL LOW (ref 26.0–34.0)
MCHC: 31.7 g/dL (ref 30.0–36.0)
MCV: 77.7 fL — AB (ref 78.0–100.0)
PLATELETS: 277 10*3/uL (ref 150–400)
RBC: 5.2 MIL/uL (ref 4.22–5.81)
RDW: 15 % (ref 11.5–15.5)
WBC: 5 10*3/uL (ref 4.0–10.5)

## 2017-10-20 MED ORDER — ACETAMINOPHEN 325 MG PO TABS
650.0000 mg | ORAL_TABLET | Freq: Once | ORAL | Status: AC
  Administered 2017-10-20: 650 mg via ORAL
  Filled 2017-10-20: qty 2

## 2017-10-20 MED ORDER — INSULIN GLARGINE 100 UNIT/ML ~~LOC~~ SOLN
55.0000 [IU] | Freq: Once | SUBCUTANEOUS | Status: AC
  Administered 2017-10-20: 55 [IU] via SUBCUTANEOUS
  Filled 2017-10-20: qty 0.55
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
## 2017-10-20 NOTE — ED Notes (Signed)
Patient transported to X-ray 

## 2017-10-21 LAB — CBG MONITORING, ED: Glucose-Capillary: 296 mg/dL — ABNORMAL HIGH (ref 65–99)

## 2017-10-21 MED ORDER — CYCLOBENZAPRINE HCL 5 MG PO TABS: 5.0000 mg | ORAL_TABLET | Freq: Every day | ORAL | 0 refills | Status: DC

## 2017-10-21 NOTE — ED Notes (Signed)
Pt departed in NAD.  

## 2017-10-21 NOTE — Discharge Instructions (Signed)
It is very important that your take your medications as prescribed.  Use tylenol as needed for pain control. You may take up to 1000 mg three times a day.  Use muscle relaxer as needed for muscle stiffness or soreness. Do No drive or operate heavy machinery if you are taking this medication.  It is important that you follow up with your primary care doctor for management of your blood sugars. Your prescriptions says to take 55 units nightly, and to adjust how much insulin you need with every meal. Return to the ER if you develop numbness, tingling, loss of bowel or bladder control, vision changes, confusion, or any new or worsening symptoms.
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
## 2017-11-01 ENCOUNTER — Ambulatory Visit: Payer: Medicaid Other | Admitting: Family Medicine

## 2017-11-07 ENCOUNTER — Encounter (HOSPITAL_COMMUNITY): Payer: Self-pay | Admitting: Emergency Medicine

## 2017-11-07 ENCOUNTER — Other Ambulatory Visit: Payer: Self-pay

## 2017-11-07 DIAGNOSIS — I110 Hypertensive heart disease with heart failure: Secondary | ICD-10-CM | POA: Insufficient documentation

## 2017-11-07 DIAGNOSIS — E1140 Type 2 diabetes mellitus with diabetic neuropathy, unspecified: Secondary | ICD-10-CM | POA: Insufficient documentation

## 2017-11-07 DIAGNOSIS — Z794 Long term (current) use of insulin: Secondary | ICD-10-CM | POA: Insufficient documentation

## 2017-11-07 DIAGNOSIS — I2510 Atherosclerotic heart disease of native coronary artery without angina pectoris: Secondary | ICD-10-CM | POA: Insufficient documentation

## 2017-11-07 DIAGNOSIS — H538 Other visual disturbances: Secondary | ICD-10-CM | POA: Insufficient documentation

## 2017-11-07 DIAGNOSIS — Z79899 Other long term (current) drug therapy: Secondary | ICD-10-CM | POA: Insufficient documentation

## 2017-11-07 DIAGNOSIS — Z7982 Long term (current) use of aspirin: Secondary | ICD-10-CM | POA: Insufficient documentation

## 2017-11-07 DIAGNOSIS — Z89411 Acquired absence of right great toe: Secondary | ICD-10-CM | POA: Insufficient documentation

## 2017-11-07 DIAGNOSIS — E1165 Type 2 diabetes mellitus with hyperglycemia: Secondary | ICD-10-CM | POA: Insufficient documentation

## 2017-11-07 DIAGNOSIS — I5022 Chronic systolic (congestive) heart failure: Secondary | ICD-10-CM | POA: Insufficient documentation

## 2017-11-07 DIAGNOSIS — Z89421 Acquired absence of other right toe(s): Secondary | ICD-10-CM | POA: Insufficient documentation

## 2017-11-07 LAB — BASIC METABOLIC PANEL
Anion gap: 7 (ref 5–15)
BUN: 11 mg/dL (ref 6–20)
CO2: 24 mmol/L (ref 22–32)
Calcium: 9 mg/dL (ref 8.9–10.3)
Chloride: 104 mmol/L (ref 101–111)
Creatinine, Ser: 0.89 mg/dL (ref 0.61–1.24)
GFR calc Af Amer: >60 mL/min (ref >60)
GLUCOSE: 389 mg/dL — AB (ref 65–99)
POTASSIUM: 3.4 mmol/L — AB (ref 3.5–5.1)
Sodium: 135 mmol/L (ref 135–145)

## 2017-11-07 LAB — URINALYSIS, ROUTINE W REFLEX MICROSCOPIC
BACTERIA UA: NONE SEEN
Bilirubin Urine: NEGATIVE
Glucose, UA: >=500 mg/dL — AB
Ketones, ur: NEGATIVE mg/dL
Leukocytes, UA: NEGATIVE
NITRITE: NEGATIVE
PH: 6 (ref 5.0–8.0)
Protein, ur: 100 mg/dL — AB
SPECIFIC GRAVITY, URINE: 1.025 (ref 1.005–1.030)

## 2017-11-07 LAB — CBC
HEMATOCRIT: 38.8 % — AB (ref 39.0–52.0)
Hemoglobin: 12.4 g/dL — ABNORMAL LOW (ref 13.0–17.0)
MCH: 24.4 pg — AB (ref 26.0–34.0)
MCHC: 32 g/dL (ref 30.0–36.0)
MCV: 76.4 fL — ABNORMAL LOW (ref 78.0–100.0)
Platelets: 253 10*3/uL (ref 150–400)
RBC: 5.08 MIL/uL (ref 4.22–5.81)
RDW: 14.8 % (ref 11.5–15.5)
WBC: 4.8 10*3/uL (ref 4.0–10.5)

## 2017-11-07 LAB — CBG MONITORING, ED: GLUCOSE-CAPILLARY: 384 mg/dL — AB (ref 65–99)

## 2017-11-07 NOTE — ED Triage Notes (Signed)
Pt to ED from home for blurred vision and dizziness since yesterday. Pt is diabetic and checked his blood sugar Friday and reports it was "in the 300s." Pt took insulin last night but has not checked CBG since Friday. Pt in NAD at this time.

## 2017-11-07 NOTE — ED Notes (Signed)
Called pt in lobby for vitals update. No response.

## 2017-11-07 NOTE — ED Notes (Signed)
Pt seen leaving building

## 2017-11-07 NOTE — ED Notes (Signed)
Second call in lobby for vitals update. No response.

## 2017-11-08 ENCOUNTER — Emergency Department (HOSPITAL_COMMUNITY)
Admission: EM | Admit: 2017-11-08 | Discharge: 2017-11-08 | Disposition: A | Discharge status: Home / Self Care | Payer: Medicaid Other | Attending: Emergency Medicine | Admitting: Emergency Medicine

## 2017-11-08 DIAGNOSIS — R739 Hyperglycemia, unspecified: Secondary | ICD-10-CM

## 2017-11-08 LAB — CBG MONITORING, ED
Glucose-Capillary: 323 mg/dL — ABNORMAL HIGH (ref 65–99)
Glucose-Capillary: 258 mg/dL — ABNORMAL HIGH (ref 65–99)

## 2017-11-08 MED ORDER — CARVEDILOL 12.5 MG PO TABS
6.2500 mg | ORAL_TABLET | Freq: Once | ORAL | Status: AC
  Administered 2017-11-08: 6.25 mg via ORAL
  Filled 2017-11-08: qty 1

## 2017-11-08 MED ORDER — CARVEDILOL 12.5 MG PO TABS
6.2500 mg | ORAL_TABLET | Freq: Two times a day (BID) | ORAL | Status: DC
Start: 2017-11-08 — End: 2017-11-08

## 2017-11-08 MED ORDER — INSULIN ASPART 100 UNIT/ML ~~LOC~~ SOLN
10.0000 [IU] | Freq: Once | SUBCUTANEOUS | Status: AC
  Administered 2017-11-08: 10 [IU] via SUBCUTANEOUS
  Filled 2017-11-08: qty 1

## 2017-11-08 MED ORDER — INSULIN ASPART 100 UNIT/ML ~~LOC~~ SOLN: 0.0000 [IU] | Freq: Three times a day (TID) | SUBCUTANEOUS | Status: DC

## 2017-11-08 NOTE — ED Provider Notes (Signed)
[facility] EMERGENCY DEPARTMENT Provider Note   CSN: [id number] Arrival date & time: 11/07/17  1816     History   Chief Complaint Chief Complaint  Patient presents with  . Hyperglycemia  . Blurred Vision    HPI Jeffrey [name] is a 51 y.o. male.  Patient with hx of CAD, DM, HTN, HL, CHF presents to the ED with a chief complaint of hyperglycemia and blurred vision.  He states that he normally gets blurred vision when his blood sugar is high.  He states that he has not been taking any of his medications today because he didn't feel like it.  He states that he does have the medicine, just didn't take it.  He denies any headache, CP, SOB.  Denies any other associated symptoms.  There are no modifying factors.   The history is provided by the patient. No language interpreter was used.    Past Medical History:  Diagnosis Date  . CAD (coronary artery disease)    a. cath 01/31/17: 60% 1st RPLB, 60% dist RCA, 55% prox RCA, 10% pro LAD --> Rx TX.   Marland Kitchen Chronic systolic CHF (congestive heart failure) ([place]) 01/28/2017   1. Echo 01/29/17:  EF 20-25, normal wall motion, mild LAE // 2. EF 10-15 by [facility] 01/2017   . Diabetes mellitus   . Diabetic foot infection ([place]) 03/2016   RT FOOT  . HTN (hypertension)   . Hyperlipidemia   . NICM (nonischemic cardiomyopathy) ([place]) 02/15/2017   1. Mod non-obs CAD on LHC in 01/2017 - CAD does not explain cardiomyopathy    Patient Active Problem List   Diagnosis Date Noted  . NICM (nonischemic cardiomyopathy) ([place]) 02/15/2017  . Coronary artery disease involving native coronary artery of native heart without angina pectoris 02/15/2017  . Hyperlipidemia 02/15/2017  . Chronic systolic CHF (congestive heart failure) ([place]) 01/28/2017  . Abscess, gluteal, right 09/12/2016  . Folliculitis 60/73/7106  . 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 ([place]) 26/94/8546  . Anemia, iron deficiency   . Noncompliance with medication regimen  03/30/2016  . Uncontrolled type 2 diabetes mellitus with hyperglycemia, with long-term current use of insulin ([place])   . Diabetic foot infection ([place]) 02/18/2016  . Cellulitis 09/25/2015  . Essential hypertension 08/21/2013  . Diabetic neuropathy ([place]) 08/21/2013  . Fungal toenail infection 08/21/2013    Past Surgical History:  Procedure Laterality Date  . AMPUTATION Right 04/01/2016   Procedure: Right Great Toe Amputation;  Surgeon: Newt Minion, MD;  Location: [facility];  Service: Orthopedics;  Laterality: Right;  . AMPUTATION Right 06/19/2016   Procedure: AMPUTATION SECOND TOE;  Surgeon: Marybelle Killings, MD;  Location: [facility];  Service: Orthopedics;  Laterality: Right;  . AMPUTATION TOE Right   . BACK SURGERY     for abscess  . RIGHT/LEFT HEART CATH AND CORONARY ANGIOGRAPHY N/A 01/31/2017   Procedure: Right/Left Heart Cath and Coronary Angiography;  Surgeon: Troy Sine, MD;  Location: [facility];  Service: Cardiovascular;  Laterality: N/A;       Home Medications    Prior to Admission medications   Medication Sig Start Date End Date Taking? Authorizing Provider  albuterol (PROVENTIL HFA;VENTOLIN HFA) 108 (90 Base) MCG/ACT inhaler Inhale 1-2 puffs into the lungs every 6 (six) hours as needed for wheezing or shortness of breath. 11/15/16   Recardo Evangelist, PA-C  aspirin EC 81 MG tablet Take 1 tablet (81 mg total) by mouth daily. Patient not taking:  Reported on 09/05/2017 02/02/17   Bonnielee Haff, MD  atorvastatin (LIPITOR) 80 MG tablet Take 1 tablet (80 mg total) by mouth daily at 6 PM. 03/22/17   Arnoldo Morale, MD  Blood Glucose Monitoring Suppl (TRUE METRIX METER) DEVI 1 each by Does not apply route 3 (three) times daily before meals. 02/15/17   Arnoldo Morale, MD  carvedilol (COREG) 6.25 MG tablet Take 1 tablet (6.25 mg total) by mouth 2 (two) times daily with a meal. 02/02/17   Bonnielee Haff, MD  cyclobenzaprine (FLEXERIL) 5 MG tablet Take 1 tablet (5 mg total) at bedtime by  mouth. 10/21/17   Caccavale, Sophia, PA-C  ferrous sulfate 325 (65 FE) MG tablet Take 1 tablet (325 mg total) by mouth 2 (two) times daily with a meal. 04/03/16   Dhungel, Nishant, MD  furosemide (LASIX) 40 MG tablet Take 1 tablet (40 mg total) by mouth as directed. Take 1 tab twice daily for 3 days; then change to 1 and 1/2 tabs = 60 mg once a day 02/15/17 10/20/17  [name] T, PA-C  gabapentin (NEURONTIN) 400 MG capsule Take 1 capsule (400 mg total) by mouth 3 (three) times daily. 09/28/16   Arnoldo Morale, MD  glucose blood (TRUE METRIX BLOOD GLUCOSE TEST) test strip Use 3 times daily before meals 02/15/17   Arnoldo Morale, MD  insulin aspart (NOVOLOG) 100 UNIT/ML injection Inject 0-15 Units into the skin 3 (three) times daily with meals. Sliding scale  CBG 70 - 120: 0 units: CBG 121 - 140: 2 units; CBG 140 - 200: 4 units; CBG 201 - 240: 6 units; CBG 240 - 300: 8 units;CBG 300 - 350: 12 units; CBG 351 - 400: 16 units; CBG > 400 : 16 units and notify your  MD 02/15/17   Arnoldo Morale, MD  Insulin Glargine (LANTUS SOLOSTAR) 100 UNIT/ML Solostar Pen Inject 55 Units into the skin daily at 10 pm. 02/15/17   Arnoldo Morale, MD  potassium chloride (K-DUR) 10 MEQ tablet Take 1 tablet (10 mEq total) by mouth as directed. Take 1 tab twice daily for 3 days; then change to 1 tab once a day 02/15/17 10/20/17  [name] T, PA-C  sacubitril-valsartan (ENTRESTO) 24-26 MG Take 1 tablet by mouth 2 (two) times daily. 02/15/17   Weaver, Scott T, PA-C  TRUEPLUS LANCETS 28G MISC 1 each by Does not apply route 3 (three) times daily before meals. 02/15/17   Arnoldo Morale, MD    Family History Family History  Problem Relation Age of Onset  . Diabetes Mother   . Hypertension Mother   . Diabetes Father   . Heart attack Father   . Diabetes Sister   . Heart attack Maternal Grandmother     Social History Social History   Tobacco Use  . Smoking status: Never Smoker  . Smokeless tobacco: Never Used  Substance Use Topics  .  Alcohol use: No  . Drug use: No     Allergies   Patient has no known allergies.   Review of Systems Review of Systems  All other systems reviewed and are negative.    Physical Exam Updated Vital Signs BP (!) 151/102 (BP Location: Left Arm)   Pulse 90   Temp 99 F (37.2 C) (Oral)   Resp 18   Ht 5\' 8"  (1.727 m)   Wt 88 kg (194 lb)   SpO2 96%   BMI 29.50 kg/m   Physical Exam  Constitutional: He is oriented to person, place, and time.  He appears well-developed and well-nourished.  HENT:  Head: Normocephalic and atraumatic.  Eyes: Conjunctivae and EOM are normal. Pupils are equal, round, and reactive to light. Right eye exhibits no discharge. Left eye exhibits no discharge. No scleral icterus.  Neck: Normal range of motion. Neck supple. No JVD present.  Cardiovascular: Normal rate, regular rhythm and normal heart sounds. Exam reveals no gallop and no friction rub.  No murmur heard. Pulmonary/Chest: Effort normal and breath sounds normal. No respiratory distress. He has no wheezes. He has no rales. He exhibits no tenderness.  Abdominal: Soft. He exhibits no distension and no mass. There is no tenderness. There is no rebound and no guarding.  Musculoskeletal: Normal range of motion. He exhibits no edema or tenderness.  Neurological: He is alert and oriented to person, place, and time.  Skin: Skin is warm and dry.  Psychiatric: He has a normal mood and affect. His behavior is normal. Judgment and thought content normal.  Nursing note and vitals reviewed.    ED Treatments / Results  Labs (all labs ordered are listed, but only abnormal results are displayed) Labs Reviewed  BASIC METABOLIC PANEL - Abnormal; Notable for the following components:      Result Value   Potassium 3.4 (*)    Glucose, Bld 389 (*)    All other components within normal limits  CBC - Abnormal; Notable for the following components:   Hemoglobin 12.4 (*)    HCT 38.8 (*)    MCV 76.4 (*)    MCH 24.4  (*)    All other components within normal limits  URINALYSIS, ROUTINE W REFLEX MICROSCOPIC - Abnormal; Notable for the following components:   Color, Urine STRAW (*)    Glucose, UA >=500 (*)    Hgb urine dipstick SMALL (*)    Protein, ur 100 (*)    Squamous Epithelial / LPF 0-5 (*)    All other components within normal limits  CBG MONITORING, ED - Abnormal; Notable for the following components:   Glucose-Capillary 384 (*)    All other components within normal limits  CBG MONITORING, ED  CBG MONITORING, ED    EKG  EKG Interpretation None       Radiology No results found.  Procedures Procedures (including critical care time)  Medications Ordered in ED Medications  carvedilol (COREG) tablet 6.25 mg (not administered)  insulin aspart (novoLOG) injection 0-15 Units (not administered)     Initial Impression / Assessment and Plan / ED Course  I have reviewed the triage vital signs and the nursing notes.  Pertinent labs & imaging results that were available during my care of the patient were reviewed by me and considered in my medical decision making (see chart for details).     Patient with hypertension and hyperglycemia.  Non-compliant with meds.  Will give meds in ED.  Has some blurred vision, but states this is normal when his blood sugar is high.  Will ensure that this improves with treatment. Normal anion gap.  Glucose trending down.  Symptoms have improved.  DC to home with PCP follow-up.  Urged medication compliance.  Final Clinical Impressions(s) / ED Diagnoses   Final diagnoses:  Hyperglycemia    ED Discharge Orders    None       Montine Circle, PA-C 11/08/17 [place], Delice Bison, DO 11/08/17 [phone]

## 2017-11-29 ENCOUNTER — Ambulatory Visit: Payer: Medicaid Other | Admitting: Family Medicine

## 2017-12-13 ENCOUNTER — Ambulatory Visit: Payer: Self-pay | Admitting: Family Medicine

## 2017-12-20 ENCOUNTER — Ambulatory Visit: Payer: Self-pay | Attending: Family Medicine | Admitting: Family Medicine

## 2017-12-20 ENCOUNTER — Encounter: Payer: Self-pay | Admitting: Family Medicine

## 2017-12-20 VITALS — BP 162/104 | HR 73 | Temp 98.6°F | Ht 67.0 in | Wt 195.6 lb

## 2017-12-20 DIAGNOSIS — E1142 Type 2 diabetes mellitus with diabetic polyneuropathy: Secondary | ICD-10-CM | POA: Insufficient documentation

## 2017-12-20 DIAGNOSIS — Z794 Long term (current) use of insulin: Secondary | ICD-10-CM | POA: Insufficient documentation

## 2017-12-20 DIAGNOSIS — I2510 Atherosclerotic heart disease of native coronary artery without angina pectoris: Secondary | ICD-10-CM | POA: Insufficient documentation

## 2017-12-20 DIAGNOSIS — E1165 Type 2 diabetes mellitus with hyperglycemia: Secondary | ICD-10-CM | POA: Insufficient documentation

## 2017-12-20 DIAGNOSIS — I428 Other cardiomyopathies: Secondary | ICD-10-CM | POA: Insufficient documentation

## 2017-12-20 DIAGNOSIS — E785 Hyperlipidemia, unspecified: Secondary | ICD-10-CM | POA: Insufficient documentation

## 2017-12-20 DIAGNOSIS — I5022 Chronic systolic (congestive) heart failure: Secondary | ICD-10-CM | POA: Insufficient documentation

## 2017-12-20 DIAGNOSIS — I10 Essential (primary) hypertension: Secondary | ICD-10-CM

## 2017-12-20 DIAGNOSIS — I110 Hypertensive heart disease with heart failure: Secondary | ICD-10-CM | POA: Insufficient documentation

## 2017-12-20 DIAGNOSIS — Z79899 Other long term (current) drug therapy: Secondary | ICD-10-CM | POA: Insufficient documentation

## 2017-12-20 DIAGNOSIS — Z7982 Long term (current) use of aspirin: Secondary | ICD-10-CM | POA: Insufficient documentation

## 2017-12-20 LAB — GLUCOSE, POCT (MANUAL RESULT ENTRY): POC Glucose: 277 mg/dl — AB (ref 70–99)

## 2017-12-20 LAB — POCT GLYCOSYLATED HEMOGLOBIN (HGB A1C): HEMOGLOBIN A1C: 13.8

## 2017-12-20 MED ORDER — SACUBITRIL-VALSARTAN 24-26 MG PO TABS: 1.0000 | ORAL_TABLET | Freq: Two times a day (BID) | ORAL | 3 refills | Status: DC

## 2017-12-20 MED ORDER — FUROSEMIDE 40 MG PO TABS: 40.0000 mg | ORAL_TABLET | Freq: Every day | ORAL | 3 refills | Status: DC

## 2017-12-20 MED ORDER — INSULIN GLARGINE 100 UNIT/ML SOLOSTAR PEN: 65.0000 [IU] | PEN_INJECTOR | Freq: Every day | SUBCUTANEOUS | 3 refills | Status: DC

## 2017-12-20 MED ORDER — GABAPENTIN 400 MG PO CAPS: 400.0000 mg | ORAL_CAPSULE | Freq: Three times a day (TID) | ORAL | 3 refills | Status: DC

## 2017-12-20 MED ORDER — ATORVASTATIN CALCIUM 80 MG PO TABS: 80.0000 mg | ORAL_TABLET | Freq: Every day | ORAL | 3 refills | Status: DC

## 2017-12-20 MED ORDER — POTASSIUM CHLORIDE ER 10 MEQ PO TBCR: 10.0000 meq | EXTENDED_RELEASE_TABLET | Freq: Every day | ORAL | 3 refills | Status: DC

## 2017-12-20 MED ORDER — INSULIN ASPART 100 UNIT/ML ~~LOC~~ SOLN: 0.0000 [IU] | Freq: Three times a day (TID) | SUBCUTANEOUS | 3 refills | Status: DC

## 2017-12-20 MED ORDER — CARVEDILOL 6.25 MG PO TABS: 6.2500 mg | ORAL_TABLET | Freq: Two times a day (BID) | ORAL | 3 refills | Status: DC

## 2017-12-20 NOTE — Progress Notes (Signed)
Subjective:  Patient ID: Jeffrey [name], male    DOB: 1966/10/13  Age: 52 y.o. MRN: [id number]  CC: Diabetes and Medication Refill   HPI CALE BETHARD is a 52 year old male with a history of type 2 diabetes mellitus (A1c 13.8 from 01/2017), diabetic neuropathy, status post right great and second toe amputation , hypertension, Nonischemic cardiomyopathy, CHF (EF 20 - 25% from 2-D echo 01/2017) who presents today for a follow-up visit.  His A1c is 13.8 which is elevated from 11.8 previously and he endorses compliance with his medications however his male companion states the patient wakes up at night to snack and this is responsible for his elevated blood sugars. He has not had hypoglycemic episodes and denies visual concerns.  Neuropathy is controlled on  gabapentin  His blood pressure is elevated and he is yet to take his antihypertensive this morning; he is fasting in anticipation of blood work.  He denies shortness of breath, chest pain and has a good exercise tolerance. Denies pedal edema or leg pains. Declines the flu shot today.  Past Medical History:  Diagnosis Date  . CAD (coronary artery disease)    a. cath 01/31/17: 60% 1st RPLB, 60% dist RCA, 55% prox RCA, 10% pro LAD --> Rx TX.   Marland Kitchen Chronic systolic CHF (congestive heart failure) ([place]) 01/28/2017   1. Echo 01/29/17:  EF 20-25, normal wall motion, mild LAE // 2. EF 10-15 by [facility] 01/2017   . Diabetes mellitus   . Diabetic foot infection ([place]) 03/2016   RT FOOT  . HTN (hypertension)   . Hyperlipidemia   . NICM (nonischemic cardiomyopathy) ([place]) 02/15/2017   1. Mod non-obs CAD on LHC in 01/2017 - CAD does not explain cardiomyopathy    Past Surgical History:  Procedure Laterality Date  . AMPUTATION Right 04/01/2016   Procedure: Right Great Toe Amputation;  Surgeon: Newt Minion, MD;  Location: [place];  Service: Orthopedics;  Laterality: Right;  . AMPUTATION Right 06/19/2016   Procedure: AMPUTATION SECOND TOE;  Surgeon: Marybelle Killings, MD;  Location: [place];  Service: Orthopedics;  Laterality: Right;  . AMPUTATION TOE Right   . BACK SURGERY     for abscess  . RIGHT/LEFT HEART CATH AND CORONARY ANGIOGRAPHY N/A 01/31/2017   Procedure: Right/Left Heart Cath and Coronary Angiography;  Surgeon: Troy Sine, MD;  Location: [place] [facility];  Service: Cardiovascular;  Laterality: N/A;    No Known Allergies   Outpatient Medications Prior to Visit  Medication Sig Dispense Refill  . albuterol (PROVENTIL HFA;VENTOLIN HFA) 108 (90 Base) MCG/ACT inhaler Inhale 1-2 puffs into the lungs every 6 (six) hours as needed for wheezing or shortness of breath. 1 Inhaler 0  . aspirin EC 81 MG tablet Take 1 tablet (81 mg total) by mouth daily. 30 tablet 3  . Blood Glucose Monitoring Suppl (TRUE METRIX METER) DEVI 1 each by Does not apply route 3 (three) times daily before meals. 1 Device 0  . cyclobenzaprine (FLEXERIL) 5 MG tablet Take 1 tablet (5 mg total) at bedtime by mouth. 10 tablet 0  . ferrous sulfate 325 (65 FE) MG tablet Take 1 tablet (325 mg total) by mouth 2 (two) times daily with a meal. 60 tablet 3  . glucose blood (TRUE METRIX BLOOD GLUCOSE TEST) test strip Use 3 times daily before meals 100 each 12  . TRUEPLUS LANCETS 28G MISC 1 each by Does not apply route 3 (three) times daily before meals. 100 each  12  . atorvastatin (LIPITOR) 80 MG tablet Take 1 tablet (80 mg total) by mouth daily at 6 PM. 30 tablet 3  . carvedilol (COREG) 6.25 MG tablet Take 1 tablet (6.25 mg total) by mouth 2 (two) times daily with a meal. 60 tablet 1  . gabapentin (NEURONTIN) 400 MG capsule Take 1 capsule (400 mg total) by mouth 3 (three) times daily. 90 capsule 3  . insulin aspart (NOVOLOG) 100 UNIT/ML injection Inject 0-15 Units into the skin 3 (three) times daily with meals. Sliding scale  CBG 70 - 120: 0 units: CBG 121 - 140: 2 units; CBG 140 - 200: 4 units; CBG 201 - 240: 6 units; CBG 240 - 300: 8 units;CBG 300 - 350: 12 units; CBG 351 - 400: 16  units; CBG > 400 : 16 units and notify your  MD 10 mL 3  . Insulin Glargine (LANTUS SOLOSTAR) 100 UNIT/ML Solostar Pen Inject 55 Units into the skin daily at 10 pm. 5 pen 3  . sacubitril-valsartan (ENTRESTO) 24-26 MG Take 1 tablet by mouth 2 (two) times daily. 60 tablet 11  . furosemide (LASIX) 40 MG tablet Take 1 tablet (40 mg total) by mouth as directed. Take 1 tab twice daily for 3 days; then change to 1 and 1/2 tabs = 60 mg once a day 140 tablet 3  . potassium chloride (K-DUR) 10 MEQ tablet Take 1 tablet (10 mEq total) by mouth as directed. Take 1 tab twice daily for 3 days; then change to 1 tab once a day 100 tablet 3   No facility-administered medications prior to visit.     ROS Review of Systems  Constitutional: Negative for activity change and appetite change.  HENT: Negative for sinus pressure and sore throat.   Eyes: Negative for visual disturbance.  Respiratory: Negative for cough, chest tightness and shortness of breath.   Cardiovascular: Negative for chest pain and leg swelling.  Gastrointestinal: Negative for abdominal distention, abdominal pain, constipation and diarrhea.  Endocrine: Negative.   Genitourinary: Negative for dysuria.  Musculoskeletal: Negative for joint swelling and myalgias.  Skin: Negative for rash.  Allergic/Immunologic: Negative.   Neurological: Negative for weakness, light-headedness and numbness.  Psychiatric/Behavioral: Negative for dysphoric mood and suicidal ideas.    Objective:  BP (!) 162/104   Pulse 73   Temp 98.6 F (37 C) (Oral)   Ht 5\' 7"  (1.702 m)   Wt 195 lb 9.6 oz (88.7 kg)   SpO2 97%   BMI 30.64 kg/m   BP/Weight 12/20/2017 11/08/2017 27/02/5008  Systolic BP 381 829 -  Diastolic BP 937 94 -  Wt. (Lbs) 195.6 - 194  BMI 30.64 29.5 -    Wt Readings from Last 3 Encounters:  12/20/17 195 lb 9.6 oz (88.7 kg)  11/07/17 194 lb (88 kg)  10/20/17 194 lb (88 kg)     Physical Exam  Constitutional: He is oriented to person, place,  and time. He appears well-developed and well-nourished.  Cardiovascular: Normal rate, normal heart sounds and intact distal pulses.  No murmur heard. Pulmonary/Chest: Effort normal and breath sounds normal. He has no wheezes. He has no rales. He exhibits no tenderness.  Abdominal: Soft. Bowel sounds are normal. He exhibits no distension and no mass. There is no tenderness.  Musculoskeletal: Normal range of motion.  Neurological: He is alert and oriented to person, place, and time.  Skin: Skin is warm and dry.  Psychiatric: He has a normal mood and affect.     CMP  Latest Ref Rng & Units 11/07/2017 10/20/2017 09/05/2017  Glucose 65 - 99 mg/dL 389(H) 395(H) 517(HH)  BUN 6 - 20 mg/dL 11 9 9   Creatinine 0.61 - 1.24 mg/dL 0.89 0.86 0.97  Sodium 135 - 145 mmol/L 135 134(L) 132(L)  Potassium 3.5 - 5.1 mmol/L 3.4(L) 3.9 3.9  Chloride 101 - 111 mmol/L 104 99(L) 99(L)  CO2 22 - 32 mmol/L 24 28 25   Calcium 8.9 - 10.3 mg/dL 9.0 9.3 8.7(L)  Total Protein 6.5 - 8.1 g/dL - 7.5 -  Total Bilirubin 0.3 - 1.2 mg/dL - 0.4 -  Alkaline Phos 38 - 126 U/L - 101 -  AST 15 - 41 U/L - 18 -  ALT 17 - 63 U/L - 19 -    Lipid Panel     Component Value Date/Time   CHOL 207 (H) 02/01/2017 0259   TRIG 208 (H) 02/01/2017 0259   HDL 42 02/01/2017 0259   CHOLHDL 4.9 02/01/2017 0259   VLDL 42 (H) 02/01/2017 0259   LDLCALC 123 (H) 02/01/2017 0259    Lab Results  Component Value Date   HGBA1C 13.8 12/20/2017    Assessment & Plan:   1. Uncontrolled type 2 diabetes mellitus with hyperglycemia, with long-term current use of insulin (HCC) Uncontrolled with A1c of 13.8 Increased dose of Lantus, continue NovoLog; avoid late night snacking. Keep blood sugar logs with fasting goals of 80-120 mg/dl, random of less than 180 and in the event of sugars less than 60 mg/dl or greater than 400 mg/dl please notify the clinic ASAP. It is recommended that you undergo annual eye exams and annual foot exams. Pneumovax is  recommended every 5 years before the age of 71 and once for a lifetime at or after the age of 74. - POCT glucose (manual entry) - POCT glycosylated hemoglobin (Hb A1C) - atorvastatin (LIPITOR) 80 MG tablet; Take 1 tablet (80 mg total) by mouth daily at 6 PM.  Dispense: 30 tablet; Refill: 3 - insulin aspart (NOVOLOG) 100 UNIT/ML injection; Inject 0-15 Units into the skin 3 (three) times daily with meals. Sliding scale  CBG 70 - 120: 0 units: CBG 121 - 140: 2 units; CBG 140 - 200: 4 units; CBG 201 - 240: 6 units; CBG 240 - 300: 8 units;CBG 300 - 350: 12 units; CBG 351 - 400: 16 units; CBG > 400 : 16 units and notify your  MD  Dispense: 10 mL; Refill: 3 - Insulin Glargine (LANTUS SOLOSTAR) 100 UNIT/ML Solostar Pen; Inject 65 Units into the skin daily at 10 pm.  Dispense: 5 pen; Refill: 3  2. Diabetic polyneuropathy associated with type 2 diabetes mellitus (HCC) Controlled - gabapentin (NEURONTIN) 400 MG capsule; Take 1 capsule (400 mg total) by mouth 3 (three) times daily.  Dispense: 90 capsule; Refill: 3  3. NICM (nonischemic cardiomyopathy) (HCC) Risk factor modification  4. Chronic systolic CHF (congestive heart failure) (HCC) EF 20-25% from echocardiogram of 01/2017 Weight is stable Continue Lasix, beta-blocker, Entresto Limit daily fluids to less than 2 L, daily weight checks, low-sodium diet - potassium chloride (K-DUR) 10 MEQ tablet; Take 1 tablet (10 mEq total) by mouth daily.  Dispense: 30 tablet; Refill: 3 - sacubitril-valsartan (ENTRESTO) 24-26 MG; Take 1 tablet by mouth 2 (two) times daily.  Dispense: 60 tablet; Refill: 3 - furosemide (LASIX) 40 MG tablet; Take 1 tablet (40 mg total) by mouth daily.  Dispense: 30 tablet; Refill: 3  5. Essential hypertension Uncontrolled due to not taking antihypertensives this morning Counseled  on blood pressure goal of less than 130/80, low-sodium, DASH diet, medication compliance, 150 minutes of moderate intensity exercise per week. Discussed  medication compliance, adverse effects. - carvedilol (COREG) 6.25 MG tablet; Take 1 tablet (6.25 mg total) by mouth 2 (two) times daily with a meal.  Dispense: 60 tablet; Refill: 3   Meds ordered this encounter  Medications  . atorvastatin (LIPITOR) 80 MG tablet    Sig: Take 1 tablet (80 mg total) by mouth daily at 6 PM.    Dispense:  30 tablet    Refill:  3  . carvedilol (COREG) 6.25 MG tablet    Sig: Take 1 tablet (6.25 mg total) by mouth 2 (two) times daily with a meal.    Dispense:  60 tablet    Refill:  3  . gabapentin (NEURONTIN) 400 MG capsule    Sig: Take 1 capsule (400 mg total) by mouth 3 (three) times daily.    Dispense:  90 capsule    Refill:  3  . insulin aspart (NOVOLOG) 100 UNIT/ML injection    Sig: Inject 0-15 Units into the skin 3 (three) times daily with meals. Sliding scale  CBG 70 - 120: 0 units: CBG 121 - 140: 2 units; CBG 140 - 200: 4 units; CBG 201 - 240: 6 units; CBG 240 - 300: 8 units;CBG 300 - 350: 12 units; CBG 351 - 400: 16 units; CBG > 400 : 16 units and notify your  MD    Dispense:  10 mL    Refill:  3  . Insulin Glargine (LANTUS SOLOSTAR) 100 UNIT/ML Solostar Pen    Sig: Inject 65 Units into the skin daily at 10 pm.    Dispense:  5 pen    Refill:  3    Discontinue previous dose  . potassium chloride (K-DUR) 10 MEQ tablet    Sig: Take 1 tablet (10 mEq total) by mouth daily.    Dispense:  30 tablet    Refill:  3  . sacubitril-valsartan (ENTRESTO) 24-26 MG    Sig: Take 1 tablet by mouth 2 (two) times daily.    Dispense:  60 tablet    Refill:  3  . furosemide (LASIX) 40 MG tablet    Sig: Take 1 tablet (40 mg total) by mouth daily.    Dispense:  30 tablet    Refill:  3    Follow-up: Return in about 3 months (around 03/20/2018) for Follow-up of chronic medical conditions.   Arnoldo Morale MD

## 2017-12-20 NOTE — Patient Instructions (Signed)

## 2017-12-21 LAB — CMP14+EGFR
ALT: 14 IU/L (ref 0–44)
AST: 20 IU/L (ref 0–40)
Albumin/Globulin Ratio: 1.2 (ref 1.2–2.2)
Albumin: 3.8 g/dL (ref 3.5–5.5)
Alkaline Phosphatase: 86 IU/L (ref 39–117)
BILIRUBIN TOTAL: 0.3 mg/dL (ref 0.0–1.2)
BUN/Creatinine Ratio: 17 (ref 9–20)
BUN: 15 mg/dL (ref 6–24)
CHLORIDE: 103 mmol/L (ref 96–106)
CO2: 23 mmol/L (ref 20–29)
Calcium: 9.3 mg/dL (ref 8.7–10.2)
Creatinine, Ser: 0.9 mg/dL (ref 0.76–1.27)
GFR calc Af Amer: 113 mL/min/{1.73_m2} (ref >59)
GFR calc non Af Amer: 98 mL/min/{1.73_m2} (ref >59)
GLOBULIN, TOTAL: 3.1 g/dL (ref 1.5–4.5)
Glucose: 317 mg/dL — ABNORMAL HIGH (ref 65–99)
POTASSIUM: 4.7 mmol/L (ref 3.5–5.2)
SODIUM: 138 mmol/L (ref 134–144)
Total Protein: 6.9 g/dL (ref 6.0–8.5)

## 2017-12-21 LAB — LIPID PANEL
Chol/HDL Ratio: 4.4 ratio (ref 0.0–5.0)
Cholesterol, Total: 232 mg/dL — ABNORMAL HIGH (ref 100–199)
HDL: 53 mg/dL (ref >39)
LDL Calculated: 158 mg/dL — ABNORMAL HIGH (ref 0–99)
TRIGLYCERIDES: 107 mg/dL (ref 0–149)
VLDL Cholesterol Cal: 21 mg/dL (ref 5–40)

## 2017-12-21 LAB — MICROALBUMIN / CREATININE URINE RATIO
CREATININE, UR: 82.7 mg/dL
MICROALB/CREAT RATIO: 1951.9 mg/g{creat} — AB (ref 0.0–30.0)
Microalbumin, Urine: 1614.2 ug/mL

## 2017-12-22 ENCOUNTER — Other Ambulatory Visit: Payer: Self-pay | Admitting: Family Medicine

## 2017-12-22 MED ORDER — ROSUVASTATIN CALCIUM 40 MG PO TABS: 40.0000 mg | ORAL_TABLET | Freq: Every day | ORAL | 3 refills | Status: DC

## 2017-12-25 ENCOUNTER — Telehealth: Payer: Self-pay

## 2017-12-25 NOTE — Telephone Encounter (Signed)
Pt was called and informed of lab results and medication change.

## 2017-12-28 MED FILL — ?CARVEDILOL 6.25 MG TABLET: 6.25 | 30 days supply | Qty: 60 | Fill #0

## 2017-12-28 MED FILL — ?HUMALOG 100 UNITS/ML VIAL: 100 | 22 days supply | Qty: 10 | Fill #0

## 2017-12-28 MED FILL — **ENTRESTO 24-26 MG TABLET: 24-26 | 7 days supply | Qty: 14 | Fill #0

## 2017-12-28 MED FILL — !LANTUS SOLOSTAR 100UNITS/M: 100 | 23 days supply | Qty: 15 | Fill #0

## 2017-12-28 MED FILL — ?FUROSEMIDE 40 MG TABLET: 40 | 30 days supply | Qty: 30 | Fill #0

## 2017-12-28 MED FILL — POTASSIUM CL 10 MEQ TAB SA: 10 | 30 days supply | Qty: 30 | Fill #0

## 2017-12-28 MED FILL — GABAPENTIN 400 MG CAPSULE: 400 | 30 days supply | Qty: 90 | Fill #0

## 2018-01-12 ENCOUNTER — Emergency Department (HOSPITAL_COMMUNITY): Payer: Medicaid Other

## 2018-01-12 ENCOUNTER — Encounter (HOSPITAL_COMMUNITY): Payer: Self-pay | Admitting: Emergency Medicine

## 2018-01-12 ENCOUNTER — Inpatient Hospital Stay (HOSPITAL_COMMUNITY)
Admission: EM | Admit: 2018-01-12 | Discharge: 2018-01-16 | DRG: 227 | Disposition: A | Discharge status: Home / Self Care | Payer: Medicaid Other | Attending: Internal Medicine | Admitting: Internal Medicine

## 2018-01-12 DIAGNOSIS — E785 Hyperlipidemia, unspecified: Secondary | ICD-10-CM | POA: Diagnosis present

## 2018-01-12 DIAGNOSIS — Z79899 Other long term (current) drug therapy: Secondary | ICD-10-CM

## 2018-01-12 DIAGNOSIS — E1165 Type 2 diabetes mellitus with hyperglycemia: Secondary | ICD-10-CM

## 2018-01-12 DIAGNOSIS — W19XXXA Unspecified fall, initial encounter: Secondary | ICD-10-CM | POA: Diagnosis present

## 2018-01-12 DIAGNOSIS — Z89411 Acquired absence of right great toe: Secondary | ICD-10-CM

## 2018-01-12 DIAGNOSIS — I5042 Chronic combined systolic (congestive) and diastolic (congestive) heart failure: Secondary | ICD-10-CM | POA: Diagnosis present

## 2018-01-12 DIAGNOSIS — D509 Iron deficiency anemia, unspecified: Secondary | ICD-10-CM

## 2018-01-12 DIAGNOSIS — D72819 Decreased white blood cell count, unspecified: Secondary | ICD-10-CM | POA: Diagnosis not present

## 2018-01-12 DIAGNOSIS — E1140 Type 2 diabetes mellitus with diabetic neuropathy, unspecified: Secondary | ICD-10-CM | POA: Diagnosis present

## 2018-01-12 DIAGNOSIS — E1142 Type 2 diabetes mellitus with diabetic polyneuropathy: Secondary | ICD-10-CM

## 2018-01-12 DIAGNOSIS — I5022 Chronic systolic (congestive) heart failure: Secondary | ICD-10-CM

## 2018-01-12 DIAGNOSIS — I2510 Atherosclerotic heart disease of native coronary artery without angina pectoris: Secondary | ICD-10-CM | POA: Diagnosis present

## 2018-01-12 DIAGNOSIS — I10 Essential (primary) hypertension: Secondary | ICD-10-CM | POA: Diagnosis present

## 2018-01-12 DIAGNOSIS — Z959 Presence of cardiac and vascular implant and graft, unspecified: Secondary | ICD-10-CM

## 2018-01-12 DIAGNOSIS — I428 Other cardiomyopathies: Secondary | ICD-10-CM

## 2018-01-12 DIAGNOSIS — Z89421 Acquired absence of other right toe(s): Secondary | ICD-10-CM

## 2018-01-12 DIAGNOSIS — E782 Mixed hyperlipidemia: Secondary | ICD-10-CM | POA: Diagnosis present

## 2018-01-12 DIAGNOSIS — I429 Cardiomyopathy, unspecified: Secondary | ICD-10-CM | POA: Diagnosis present

## 2018-01-12 DIAGNOSIS — E876 Hypokalemia: Secondary | ICD-10-CM | POA: Diagnosis not present

## 2018-01-12 DIAGNOSIS — Z794 Long term (current) use of insulin: Secondary | ICD-10-CM

## 2018-01-12 DIAGNOSIS — Z9119 Patient's noncompliance with other medical treatment and regimen: Secondary | ICD-10-CM

## 2018-01-12 DIAGNOSIS — Z7982 Long term (current) use of aspirin: Secondary | ICD-10-CM

## 2018-01-12 DIAGNOSIS — R55 Syncope and collapse: Secondary | ICD-10-CM

## 2018-01-12 DIAGNOSIS — I110 Hypertensive heart disease with heart failure: Secondary | ICD-10-CM | POA: Diagnosis present

## 2018-01-12 LAB — URINALYSIS, ROUTINE W REFLEX MICROSCOPIC
Bilirubin Urine: NEGATIVE
GLUCOSE, UA: 50 mg/dL — AB
KETONES UR: NEGATIVE mg/dL
LEUKOCYTES UA: NEGATIVE
NITRITE: NEGATIVE
PH: 6 (ref 5.0–8.0)
Specific Gravity, Urine: 1.025 (ref 1.005–1.030)

## 2018-01-12 LAB — CBC
HCT: 40.3 % (ref 39.0–52.0)
HEMOGLOBIN: 12.5 g/dL — AB (ref 13.0–17.0)
MCH: 24.2 pg — AB (ref 26.0–34.0)
MCHC: 31 g/dL (ref 30.0–36.0)
MCV: 77.9 fL — ABNORMAL LOW (ref 78.0–100.0)
PLATELETS: 258 10*3/uL (ref 150–400)
RBC: 5.17 MIL/uL (ref 4.22–5.81)
RDW: 15.7 % — ABNORMAL HIGH (ref 11.5–15.5)
WBC: 4.6 10*3/uL (ref 4.0–10.5)

## 2018-01-12 LAB — CBG MONITORING, ED: Glucose-Capillary: 180 mg/dL — ABNORMAL HIGH (ref 65–99)

## 2018-01-12 LAB — BASIC METABOLIC PANEL
ANION GAP: 10 (ref 5–15)
BUN: 20 mg/dL (ref 6–20)
CALCIUM: 9 mg/dL (ref 8.9–10.3)
CO2: 26 mmol/L (ref 22–32)
Chloride: 103 mmol/L (ref 101–111)
Creatinine, Ser: 1.11 mg/dL (ref 0.61–1.24)
Glucose, Bld: 190 mg/dL — ABNORMAL HIGH (ref 65–99)
Potassium: 3.3 mmol/L — ABNORMAL LOW (ref 3.5–5.1)
SODIUM: 139 mmol/L (ref 135–145)

## 2018-01-12 MED ORDER — POTASSIUM CHLORIDE CRYS ER 20 MEQ PO TBCR
40.0000 meq | EXTENDED_RELEASE_TABLET | Freq: Once | ORAL | Status: AC
  Administered 2018-01-13: 40 meq via ORAL
  Filled 2018-01-12: qty 2

## 2018-01-12 NOTE — ED Triage Notes (Signed)
Pt reports syncope yesterday at dinner. Reports some neck pain and head pain since. Called PCP and they sent him here. CBG in triage 180.

## 2018-01-12 NOTE — ED Notes (Signed)
Patient transported to CT 

## 2018-01-12 NOTE — ED Provider Notes (Signed)
[facility] EMERGENCY DEPARTMENT Provider Note   CSN: [id number] Arrival date & time: 01/12/18  1955     History   Chief Complaint Chief Complaint  Patient presents with  . Loss of Consciousness    HPI Jeffrey [name] is a 52 y.o. male.  The history is provided by the patient.  He has history of systolic heart failure, coronary artery disease, diabetes, hypertension, hyperlipidemia and comes in after having had a syncopal episode yesterday.  Wife states that he was sitting and suddenly had loss of consciousness.  He was unconscious for 1-85minutes.  He did have a confused look when he regained consciousness.  He denies chest pain, heaviness, tightness, pressure.  Denies any palpitations.  He denies any change in his baseline dyspnea.  He denied any nausea or vomiting or diaphoresis.  He has not had any further episodes of syncope since then.  He had no bit lip or tongue and no urinary or fecal incontinence.  Since the syncopal episode, he has had a global headache and some pain in his neck.  He rates pain at 8/10.  Of note, he has not had any change in his usual 2 pillow orthopnea and no change in his chronic dyspnea on exertion with dyspnea being brought on by walking approximately 15-20 feet.  Past Medical History:  Diagnosis Date  . CAD (coronary artery disease)    a. cath 01/31/17: 60% 1st RPLB, 60% dist RCA, 55% prox RCA, 10% pro LAD --> Rx TX.   Marland Kitchen Chronic systolic CHF (congestive heart failure) ([place]) 01/28/2017   1. Echo 01/29/17:  EF 20-25, normal wall motion, mild LAE // 2. EF 10-15 by [facility] 01/2017   . Diabetes mellitus   . Diabetic foot infection ([place]) 03/2016   RT FOOT  . HTN (hypertension)   . Hyperlipidemia   . NICM (nonischemic cardiomyopathy) ([place]) 02/15/2017   1. Mod non-obs CAD on LHC in 01/2017 - CAD does not explain cardiomyopathy    Patient Active Problem List   Diagnosis Date Noted  . NICM (nonischemic cardiomyopathy) ([place]) 02/15/2017  . Coronary  artery disease involving native coronary artery of native heart without angina pectoris 02/15/2017  . Hyperlipidemia 02/15/2017  . Chronic systolic CHF (congestive heart failure) ([place]) 01/28/2017  . Abscess, gluteal, right 09/12/2016  . Folliculitis 14/43/1540  . 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 Anemia, iron deficiency   . Noncompliance with medication regimen 03/30/2016  . Uncontrolled type 2 diabetes mellitus with hyperglycemia, with long-term current use of insulin ([place])   . Diabetic foot infection ([place]) 02/18/2016  . Cellulitis 09/25/2015  . Essential hypertension 08/21/2013  . Diabetic neuropathy ([place]) 08/21/2013  . Fungal toenail infection 08/21/2013    Past Surgical History:  Procedure Laterality Date  . AMPUTATION Right 04/01/2016   Procedure: Right Great Toe Amputation;  Surgeon: Newt Minion, MD;  Location: [facility];  Service: Orthopedics;  Laterality: Right;  . AMPUTATION Right 06/19/2016   Procedure: AMPUTATION SECOND TOE;  Surgeon: Marybelle Killings, MD;  Location: [facility];  Service: Orthopedics;  Laterality: Right;  . AMPUTATION TOE Right   . BACK SURGERY     for abscess  . RIGHT/LEFT HEART CATH AND CORONARY ANGIOGRAPHY N/A 01/31/2017   Procedure: Right/Left Heart Cath and Coronary Angiography;  Surgeon: Troy Sine, MD;  Location: [facility];  Service: Cardiovascular;  Laterality: N/A;       Home Medications    Prior  to Admission medications   Medication Sig Start Date End Date Taking? Authorizing Provider  acetaminophen (TYLENOL) 500 MG tablet Take 1,000 mg by mouth every 6 (six) hours as needed for headache (pain).   Yes [provider]  albuterol (PROVENTIL HFA;VENTOLIN HFA) 108 (90 Base) MCG/ACT inhaler Inhale 1-2 puffs into the lungs every 6 (six) hours as needed for wheezing or shortness of breath. 11/15/16  Yes Recardo Evangelist, PA-C  carvedilol (COREG) 6.25 MG tablet Take 1 tablet (6.25 mg total) by mouth  2 (two) times daily with a meal. 12/20/17  Yes Newlin, Enobong, MD  cyclobenzaprine (FLEXERIL) 5 MG tablet Take 1 tablet (5 mg total) at bedtime by mouth. Patient taking differently: Take 5 mg by mouth at bedtime as needed for muscle spasms.  10/21/17  Yes Caccavale, Sophia, PA-C  ferrous sulfate 325 (65 FE) MG tablet Take 1 tablet (325 mg total) by mouth 2 (two) times daily with a meal. Patient taking differently: Take 325 mg by mouth daily with breakfast.  04/03/16  Yes Dhungel, Nishant, MD  furosemide (LASIX) 40 MG tablet Take 1 tablet (40 mg total) by mouth daily. 12/20/17 03/20/18 Yes Charlott Rakes, MD  gabapentin (NEURONTIN) 400 MG capsule Take 1 capsule (400 mg total) by mouth 3 (three) times daily. 12/20/17  Yes Newlin, Charlane Ferretti, MD  insulin aspart (NOVOLOG) 100 UNIT/ML injection Inject 0-15 Units into the skin 3 (three) times daily with meals. Sliding scale  CBG 70 - 120: 0 units: CBG 121 - 140: 2 units; CBG 140 - 200: 4 units; CBG 201 - 240: 6 units; CBG 240 - 300: 8 units;CBG 300 - 350: 12 units; CBG 351 - 400: 16 units; CBG > 400 : 16 units and notify your  MD 12/20/17  Yes Charlott Rakes, MD  Insulin Glargine (LANTUS SOLOSTAR) 100 UNIT/ML Solostar Pen Inject 65 Units into the skin daily at 10 pm. 12/20/17  Yes Charlott Rakes, MD  potassium chloride (K-DUR) 10 MEQ tablet Take 1 tablet (10 mEq total) by mouth daily. 12/20/17 03/20/18 Yes Charlott Rakes, MD  rosuvastatin (CRESTOR) 40 MG tablet Take 1 tablet (40 mg total) by mouth daily. 12/22/17  Yes Newlin, Charlane Ferretti, MD  sacubitril-valsartan (ENTRESTO) 24-26 MG Take 1 tablet by mouth 2 (two) times daily. 12/20/17  Yes Charlott Rakes, MD  aspirin EC 81 MG tablet Take 1 tablet (81 mg total) by mouth daily. Patient not taking: Reported on 01/12/2018 02/02/17   Bonnielee Haff, MD  Blood Glucose Monitoring Suppl (TRUE METRIX METER) DEVI 1 each by Does not apply route 3 (three) times daily before meals. 02/15/17   Charlott Rakes, MD  glucose blood (TRUE METRIX  BLOOD GLUCOSE TEST) test strip Use 3 times daily before meals 02/15/17   Charlott Rakes, MD  TRUEPLUS LANCETS 28G MISC 1 each by Does not apply route 3 (three) times daily before meals. 02/15/17   Charlott Rakes, MD    Family History Family History  Problem Relation Age of Onset  . Diabetes Mother   . Hypertension Mother   . Diabetes Father   . Heart attack Father   . Diabetes Sister   . Heart attack Maternal Grandmother     Social History Social History   Tobacco Use  . Smoking status: Never Smoker  . Smokeless tobacco: Never Used  Substance Use Topics  . Alcohol use: No  . Drug use: No     Allergies   Patient has no known allergies.   Review of Systems Review of Systems  All other systems reviewed and are negative.    Physical Exam Updated Vital Signs BP (!) 155/100 (BP Location: Right Arm)   Pulse 84   Temp 98.7 F (37.1 C) (Oral)   Resp 18   Ht 5\' 7"  (1.702 m)   Wt 89.4 kg (197 lb)   SpO2 99%   BMI 30.85 kg/m   Physical Exam  Nursing note and vitals reviewed.  52 year old male, resting comfortably and in no acute distress. Vital signs are significant for elevated blood pressure. Oxygen saturation is 99%, which is normal. Head is normocephalic and atraumatic. PERRLA, EOMI. Oropharynx is clear. Neck is nontender and supple without adenopathy or JVD.  There are no carotid bruits. Back is nontender and there is no CVA tenderness. Lungs are clear without rales, wheezes, or rhonchi. Chest is nontender. Heart has regular rate and rhythm without murmur. Abdomen is soft, flat, nontender without masses or hepatosplenomegaly and peristalsis is normoactive. Extremities have 1+ edema, full range of motion is present. Skin is warm and dry without rash. Neurologic: Mental status is normal, cranial nerves are intact, there are no motor or sensory deficits.  ED Treatments / Results  Labs (all labs ordered are listed, but only abnormal results are displayed) Labs  Reviewed  BASIC METABOLIC PANEL - Abnormal; Notable for the following components:      Result Value   Potassium 3.3 (*)    Glucose, Bld 190 (*)    All other components within normal limits  CBC - Abnormal; Notable for the following components:   Hemoglobin 12.5 (*)    MCV 77.9 (*)    MCH 24.2 (*)    RDW 15.7 (*)    All other components within normal limits  URINALYSIS, ROUTINE W REFLEX MICROSCOPIC - Abnormal; Notable for the following components:   Glucose, UA 50 (*)    Hgb urine dipstick SMALL (*)    Protein, ur >=300 (*)    Bacteria, UA RARE (*)    Squamous Epithelial / LPF 0-5 (*)    All other components within normal limits  CBG MONITORING, ED - Abnormal; Notable for the following components:   Glucose-Capillary 180 (*)    All other components within normal limits  BRAIN NATRIURETIC PEPTIDE  TROPONIN I  MAGNESIUM  CBG MONITORING, ED    EKG  EKG Interpretation  Date/Time:  Friday January 12 2018 20:09:30 EST Ventricular Rate:  89 PR Interval:  180 QRS Duration: 94 QT Interval:  392 QTC Calculation: 476 R Axis:   -32 Text Interpretation:  Normal sinus rhythm Left axis deviation Abnormal ECG Nonspecific T wave abnormality When compared with ECG of 01/29/2017, No significant change was found Confirmed by Delora Fuel ([id number]) on 01/12/2018 11:12:59 PM       Radiology Dg Chest 2 View  Result Date: 01/12/2018 CLINICAL DATA:  Initial evaluation for acute syncope. EXAM: CHEST  2 VIEW COMPARISON:  Prior radiograph from 10/20/17. FINDINGS: Mild cardiomegaly, stable.  Mediastinal silhouette. The lungs are normally inflated. No airspace consolidation. Superimposed mild left basilar atelectasis. Mild diffuse vascular congestion without frank pulmonary edema. No pleural effusion. No pneumothorax. No acute osseous abnormality identified. IMPRESSION: 1. Stable cardiomegaly with mild diffuse pulmonary vascular congestion without frank pulmonary edema. 2. Superimposed mild left basilar  atelectasis. Electronically Signed   By: Jeannine Boga M.D.   On: 01/12/2018 23:56   Ct Head Wo Contrast  Result Date: 01/12/2018 CLINICAL DATA:  Patient passed out and fell forward onto a wooden floor yesterday.  Headache and neck pain. EXAM: CT HEAD WITHOUT CONTRAST CT CERVICAL SPINE WITHOUT CONTRAST TECHNIQUE: Multidetector CT imaging of the head and cervical spine was performed following the standard protocol without intravenous contrast. Multiplanar CT image reconstructions of the cervical spine were also generated. COMPARISON:  None. FINDINGS: CT HEAD FINDINGS Brain: No evidence of acute infarction, hemorrhage, hydrocephalus, extra-axial collection or mass lesion/mass effect. Vascular: No hyperdense vessel or unexpected calcification. Skull: Normal. Negative for fracture or focal lesion. Sinuses/Orbits: No acute finding. Other: None. CT CERVICAL SPINE FINDINGS Alignment: Reversal of the usual cervical lordosis without anterior subluxation. This may be due to patient positioning but ligamentous injury or muscle spasm could also have this appearance and are not excluded. Normal alignment of the facet joints. C1-2 articulation appears intact. Skull base and vertebrae: Skull base appears intact. No vertebral compression deformities. No focal bone lesion or bone destruction. Soft tissues and spinal canal: No prevertebral soft tissue swelling. No paraspinal soft tissue mass or infiltration. Disc levels: Degenerative changes with disc space narrowing and endplate hypertrophic changes at C4-5, C5-6, and C6-7 levels. Upper chest: Lung apices are clear. Other: None. IMPRESSION: 1. No acute intracranial abnormalities. 2. Nonspecific reversal of the usual cervical lordosis. Mild degenerative changes in the cervical spine. No acute displaced fractures identified. Electronically Signed   By: Lucienne Capers M.D.   On: 01/12/2018 23:55   Ct Cervical Spine Wo Contrast  Result Date: 01/12/2018 CLINICAL DATA:   Patient passed out and fell forward onto a wooden floor yesterday. Headache and neck pain. EXAM: CT HEAD WITHOUT CONTRAST CT CERVICAL SPINE WITHOUT CONTRAST TECHNIQUE: Multidetector CT imaging of the head and cervical spine was performed following the standard protocol without intravenous contrast. Multiplanar CT image reconstructions of the cervical spine were also generated. COMPARISON:  None. FINDINGS: CT HEAD FINDINGS Brain: No evidence of acute infarction, hemorrhage, hydrocephalus, extra-axial collection or mass lesion/mass effect. Vascular: No hyperdense vessel or unexpected calcification. Skull: Normal. Negative for fracture or focal lesion. Sinuses/Orbits: No acute finding. Other: None. CT CERVICAL SPINE FINDINGS Alignment: Reversal of the usual cervical lordosis without anterior subluxation. This may be due to patient positioning but ligamentous injury or muscle spasm could also have this appearance and are not excluded. Normal alignment of the facet joints. C1-2 articulation appears intact. Skull base and vertebrae: Skull base appears intact. No vertebral compression deformities. No focal bone lesion or bone destruction. Soft tissues and spinal canal: No prevertebral soft tissue swelling. No paraspinal soft tissue mass or infiltration. Disc levels: Degenerative changes with disc space narrowing and endplate hypertrophic changes at C4-5, C5-6, and C6-7 levels. Upper chest: Lung apices are clear. Other: None. IMPRESSION: 1. No acute intracranial abnormalities. 2. Nonspecific reversal of the usual cervical lordosis. Mild degenerative changes in the cervical spine. No acute displaced fractures identified. Electronically Signed   By: Lucienne Capers M.D.   On: 01/12/2018 23:55    Procedures Procedures (including critical care time)  Medications Ordered in ED Medications  potassium chloride SA (K-DUR,KLOR-CON) CR tablet 40 mEq (40 mEq Oral Given 01/13/18 0011)     Initial Impression / Assessment and  Plan / ED Course  I have reviewed the triage vital signs and the nursing notes.  Pertinent labs & imaging results that were available during my care of the patient were reviewed by me and considered in my medical decision making (see chart for details).  Syncopal episode of uncertain cause.  Old records are reviewed, and he had been hospitalized in February of last  year with acute congestive heart failure at which point he was noted to have ejection fraction of 20-25%.  Cardiac catheterization showed nonobstructive disease with a global left ventricular dysfunction and EF 10-15% consistent with nonischemic cardiomyopathy.  He had a follow-up appointment with cardiology on March 7 and was advised to return in 2 weeks for repeat echocardiogram and consideration for implanted defibrillator if ejection fraction was less than 35%.  There were no further cardiology office visits, and repeat echocardiogram never occurred.  Given his history, I am extremely concerned of malignant arrhythmia causing his syncope.  Today's ECG shows no change compared with prior.  He has nonspecific T wave changes.  Laboratory workup today shows mild hypokalemia and he will be given potassium supplementation.  Mild anemia is present and unchanged from baseline.  He will be sent for CT of head and cervical spine given headache and neck pain.  He will need to be admitted for cardiac monitoring and will need to have repeat echocardiogram.  If ejection fraction is persistently low, he will need to be referred to electrophysiology for consideration for implanted cardiac defibrillator.  Chest x-ray shows stable changes of heart failure.  CT of head and cervical spine show no acute process.  At this time, troponin, BNP, magnesium are still pending.  Case has been discussed with Dr. Laren Everts of [facility], who agrees to admit the patient.  Final Clinical Impressions(s) / ED Diagnoses   Final diagnoses:  Syncope, unspecified syncope  type  Chronic systolic heart failure ([place])  Hypokalemia  Microcytic anemia    ED Discharge Orders    None       Delora Fuel, MD 37/36/68 [phone]

## 2018-01-13 ENCOUNTER — Other Ambulatory Visit: Payer: Self-pay

## 2018-01-13 DIAGNOSIS — R55 Syncope and collapse: Secondary | ICD-10-CM | POA: Diagnosis present

## 2018-01-13 LAB — CREATININE, SERUM
Creatinine, Ser: 0.93 mg/dL (ref 0.61–1.24)
GFR calc Af Amer: >60 mL/min (ref >60)
GFR calc non Af Amer: >60 mL/min (ref >60)

## 2018-01-13 LAB — BASIC METABOLIC PANEL
Anion gap: 8 (ref 5–15)
BUN: 16 mg/dL (ref 6–20)
CHLORIDE: 107 mmol/L (ref 101–111)
CO2: 25 mmol/L (ref 22–32)
CREATININE: 0.89 mg/dL (ref 0.61–1.24)
Calcium: 8.8 mg/dL — ABNORMAL LOW (ref 8.9–10.3)
GFR calc Af Amer: >60 mL/min (ref >60)
Glucose, Bld: 124 mg/dL — ABNORMAL HIGH (ref 65–99)
Potassium: 3.6 mmol/L (ref 3.5–5.1)
SODIUM: 140 mmol/L (ref 135–145)

## 2018-01-13 LAB — CBC
HCT: 40.7 % (ref 39.0–52.0)
HEMATOCRIT: 41.2 % (ref 39.0–52.0)
Hemoglobin: 12.8 g/dL — ABNORMAL LOW (ref 13.0–17.0)
Hemoglobin: 12.7 g/dL — ABNORMAL LOW (ref 13.0–17.0)
MCH: 24.3 pg — AB (ref 26.0–34.0)
MCH: 24.4 pg — ABNORMAL LOW (ref 26.0–34.0)
MCHC: 31.1 g/dL (ref 30.0–36.0)
MCHC: 31.2 g/dL (ref 30.0–36.0)
MCV: 78.2 fL (ref 78.0–100.0)
MCV: 78.1 fL (ref 78.0–100.0)
PLATELETS: 248 10*3/uL (ref 150–400)
PLATELETS: 243 10*3/uL (ref 150–400)
RBC: 5.27 MIL/uL (ref 4.22–5.81)
RBC: 5.21 MIL/uL (ref 4.22–5.81)
RDW: 15.7 % — ABNORMAL HIGH (ref 11.5–15.5)
RDW: 15.8 % — AB (ref 11.5–15.5)
WBC: 4.5 10*3/uL (ref 4.0–10.5)
WBC: 4.2 10*3/uL (ref 4.0–10.5)

## 2018-01-13 LAB — TROPONIN I
Troponin I: <0.03 ng/mL (ref <0.03)
Troponin I: <0.03 ng/mL (ref <0.03)

## 2018-01-13 LAB — GLUCOSE, CAPILLARY
GLUCOSE-CAPILLARY: 246 mg/dL — AB (ref 65–99)
GLUCOSE-CAPILLARY: 254 mg/dL — AB (ref 65–99)
GLUCOSE-CAPILLARY: 289 mg/dL — AB (ref 65–99)

## 2018-01-13 LAB — BRAIN NATRIURETIC PEPTIDE: B Natriuretic Peptide: 34.3 pg/mL (ref 0.0–100.0)

## 2018-01-13 LAB — MAGNESIUM: Magnesium: 1.8 mg/dL (ref 1.7–2.4)

## 2018-01-13 MED ORDER — ALBUTEROL SULFATE (2.5 MG/3ML) 0.083% IN NEBU: 2.5000 mg | INHALATION_SOLUTION | RESPIRATORY_TRACT | Status: DC | PRN

## 2018-01-13 MED ORDER — ONDANSETRON HCL 4 MG/2ML IJ SOLN: 4.0000 mg | Freq: Four times a day (QID) | INTRAMUSCULAR | Status: DC | PRN

## 2018-01-13 MED ORDER — CARVEDILOL 6.25 MG PO TABS
6.2500 mg | ORAL_TABLET | Freq: Two times a day (BID) | ORAL | Status: DC
  Administered 2018-01-13 – 2018-01-16 (×6): 6.25 mg via ORAL
  Filled 2018-01-13 (×6): qty 1

## 2018-01-13 MED ORDER — SODIUM CHLORIDE 0.9% FLUSH
3.0000 mL | Freq: Two times a day (BID) | INTRAVENOUS | Status: DC
  Administered 2018-01-13 – 2018-01-16 (×8): 3 mL via INTRAVENOUS

## 2018-01-13 MED ORDER — ROSUVASTATIN CALCIUM 40 MG PO TABS
40.0000 mg | ORAL_TABLET | Freq: Every day | ORAL | Status: DC
  Administered 2018-01-13 – 2018-01-16 (×4): 40 mg via ORAL
  Filled 2018-01-13 (×4): qty 1

## 2018-01-13 MED ORDER — ZOLPIDEM TARTRATE 5 MG PO TABS
5.0000 mg | ORAL_TABLET | Freq: Every evening | ORAL | Status: DC | PRN
  Administered 2018-01-15: 5 mg via ORAL
  Filled 2018-01-13: qty 1

## 2018-01-13 MED ORDER — SODIUM CHLORIDE 0.9 % IV SOLN: 250.0000 mL | INTRAVENOUS | Status: DC | PRN

## 2018-01-13 MED ORDER — ALBUTEROL SULFATE HFA 108 (90 BASE) MCG/ACT IN AERS: 1.0000 | INHALATION_SPRAY | Freq: Four times a day (QID) | RESPIRATORY_TRACT | Status: DC | PRN

## 2018-01-13 MED ORDER — CYCLOBENZAPRINE HCL 5 MG PO TABS
5.0000 mg | ORAL_TABLET | Freq: Every day | ORAL | Status: DC
  Administered 2018-01-13 – 2018-01-15 (×3): 5 mg via ORAL
  Filled 2018-01-13 (×3): qty 1

## 2018-01-13 MED ORDER — FUROSEMIDE 40 MG PO TABS
40.0000 mg | ORAL_TABLET | Freq: Every day | ORAL | Status: DC
  Administered 2018-01-13 – 2018-01-16 (×4): 40 mg via ORAL
  Filled 2018-01-13: qty 1
  Filled 2018-01-13: qty 2
  Filled 2018-01-13 (×2): qty 1

## 2018-01-13 MED ORDER — ONDANSETRON HCL 4 MG PO TABS: 4.0000 mg | ORAL_TABLET | Freq: Four times a day (QID) | ORAL | Status: DC | PRN

## 2018-01-13 MED ORDER — ACETAMINOPHEN 500 MG PO TABS
1000.0000 mg | ORAL_TABLET | Freq: Four times a day (QID) | ORAL | Status: DC | PRN
  Administered 2018-01-15: 1000 mg via ORAL
  Filled 2018-01-13: qty 2

## 2018-01-13 MED ORDER — SODIUM CHLORIDE 0.9% FLUSH: 3.0000 mL | INTRAVENOUS | Status: DC | PRN

## 2018-01-13 MED ORDER — ENOXAPARIN SODIUM 40 MG/0.4ML ~~LOC~~ SOLN
40.0000 mg | Freq: Every day | SUBCUTANEOUS | Status: DC
  Administered 2018-01-13 – 2018-01-14 (×2): 40 mg via SUBCUTANEOUS
  Filled 2018-01-13 (×3): qty 0.4

## 2018-01-13 MED ORDER — GABAPENTIN 400 MG PO CAPS
400.0000 mg | ORAL_CAPSULE | Freq: Three times a day (TID) | ORAL | Status: DC
  Administered 2018-01-13 – 2018-01-16 (×9): 400 mg via ORAL
  Filled 2018-01-13 (×9): qty 1

## 2018-01-13 MED ORDER — SACUBITRIL-VALSARTAN 24-26 MG PO TABS
1.0000 | ORAL_TABLET | Freq: Two times a day (BID) | ORAL | Status: DC
  Administered 2018-01-13 – 2018-01-16 (×8): 1 via ORAL
  Filled 2018-01-13 (×8): qty 1

## 2018-01-13 NOTE — ED Notes (Signed)
[name], Vascular Tech, aware of order for Echo - advised may be tomorrow.

## 2018-01-13 NOTE — ED Notes (Addendum)
Pt being escorted to 3E26 via bed w/telemetry. Male visitor w/pt also.

## 2018-01-13 NOTE — H&P (Signed)
[facility] hospital[facility] update note  States he had a sudden episode of loss of consciousness in the kitchen.  Ended up in the floor.  Not really sure what happened.  Patient states a similar event may have occurred one other time.  Denies any seizure-like activity.  No history of seizures.  No family history of seizure.  Reviewed H&P by Dr. Laren Everts, agree with plan.   Cardiology consult  Elwin Mocha, MD

## 2018-01-13 NOTE — ED Notes (Signed)
Lying on bed w/eyes closed. Respirations even, unlabored. Monitor intact to pt.

## 2018-01-13 NOTE — ED Notes (Signed)
I will contact main lab to add am bmet and cbc.

## 2018-01-13 NOTE — Progress Notes (Signed)
New pt admission from ED. Pt brought to the floor in stable condition. Vitals taken. Initial Assessment done. All immediate pertinent needs to patient addressed. Patient Guide given to patient. Important safety instructions relating to hospitalization reviewed with patient. Patient verbalized understanding. Will continue to monitor pt.  Murrel Freet, RN 

## 2018-01-13 NOTE — ED Notes (Signed)
Nurse drawing labs. 

## 2018-01-13 NOTE — ED Notes (Signed)
Family visiting w/pt x 2.

## 2018-01-13 NOTE — H&P (Signed)
[facility]                                                                                    Patient Demographics  Jeffrey [name], is a 52 y.o. male  CSN: [id number]  MRN: [id number]  DOB - Aug 30, 1966  Admit Date - 01/12/2018  Outpatient Primary MD for the patient is Charlott Rakes, MD   With History of -  Past Medical History:  Diagnosis Date  . CAD (coronary artery disease)    a. cath 01/31/17: 60% 1st RPLB, 60% dist RCA, 55% prox RCA, 10% pro LAD --> Rx TX.   Marland Kitchen Chronic systolic CHF (congestive heart failure) ([place]) 01/28/2017   1. Echo 01/29/17:  EF 20-25, normal wall motion, mild LAE // 2. EF 10-15 by [facility] 01/2017   . Diabetes mellitus   . Diabetic foot infection ([place]) 03/2016   RT FOOT  . HTN (hypertension)   . Hyperlipidemia   . NICM (nonischemic cardiomyopathy) ([place]) 02/15/2017   1. Mod non-obs CAD on LHC in 01/2017 - CAD does not explain cardiomyopathy      Past Surgical History:  Procedure Laterality Date  . AMPUTATION Right 04/01/2016   Procedure: Right Great Toe Amputation;  Surgeon: Newt Minion, MD;  Location: [place];  Service: Orthopedics;  Laterality: Right;  . AMPUTATION Right 06/19/2016   Procedure: AMPUTATION SECOND TOE;  Surgeon: Marybelle Killings, MD;  Location: [place];  Service: Orthopedics;  Laterality: Right;  . AMPUTATION TOE Right   . BACK SURGERY     for abscess  . RIGHT/LEFT HEART CATH AND CORONARY ANGIOGRAPHY N/A 01/31/2017   Procedure: Right/Left Heart Cath and Coronary Angiography;  Surgeon: Troy Sine, MD;  Location: [place] [facility];  Service: Cardiovascular;  Laterality: N/A;    in for   Chief Complaint  Patient presents with  . Loss of Consciousness     HPI  Jeffrey [name]  is a 52 y.o. male, with past medical history significant for chronic systolic congestive heart failure, coronary artery disease presenting with a syncopal episode that happened yesterday at night. Patient lives at home with a friend and he passed out from  a standing position. He denies any preceding chest pains or palpitations. Patient did not feel like coming to the hospital yesterday. No history of seizures. Patient has a history of congestive heart failure with ejection fraction 20-25% and he is supposed to follow-up with an echo to be evaluated for defibrillator placement. He failed to follow-up.    Review of Systems    In addition to the HPI above,  No Fever-chills, No Headache, No changes with Vision or hearing, No problems swallowing food or Liquids, No Chest pain, Cough or Shortness of Breath, No Abdominal pain, No Nausea or Vommitting, Bowel movements are regular, No Blood in stool or Urine, No dysuria, No new skin rashes or bruises, No new joints pains-aches,  No new weakness, tingling, numbness in any extremity, No recent weight gain or loss, No polyuria, polydypsia or polyphagia, No significant Mental Stressors.  A full 10 point Review of Systems was done, except as stated above, all other Review of Systems were  negative.   Social History Social History   Tobacco Use  . Smoking status: Never Smoker  . Smokeless tobacco: Never Used  Substance Use Topics  . Alcohol use: No     Family History Family History  Problem Relation Age of Onset  . Diabetes Mother   . Hypertension Mother   . Diabetes Father   . Heart attack Father   . Diabetes Sister   . Heart attack Maternal Grandmother      Prior to Admission medications   Medication Sig Start Date End Date Taking? Authorizing Provider  acetaminophen (TYLENOL) 500 MG tablet Take 1,000 mg by mouth every 6 (six) hours as needed for headache (pain).   Yes [provider]  albuterol (PROVENTIL HFA;VENTOLIN HFA) 108 (90 Base) MCG/ACT inhaler Inhale 1-2 puffs into the lungs every 6 (six) hours as needed for wheezing or shortness of breath. 11/15/16  Yes Recardo Evangelist, PA-C  carvedilol (COREG) 6.25 MG tablet Take 1 tablet (6.25 mg total) by mouth 2 (two)  times daily with a meal. 12/20/17  Yes Newlin, Enobong, MD  cyclobenzaprine (FLEXERIL) 5 MG tablet Take 1 tablet (5 mg total) at bedtime by mouth. Patient taking differently: Take 5 mg by mouth at bedtime as needed for muscle spasms.  10/21/17  Yes Caccavale, Sophia, PA-C  ferrous sulfate 325 (65 FE) MG tablet Take 1 tablet (325 mg total) by mouth 2 (two) times daily with a meal. Patient taking differently: Take 325 mg by mouth daily with breakfast.  04/03/16  Yes Dhungel, Nishant, MD  furosemide (LASIX) 40 MG tablet Take 1 tablet (40 mg total) by mouth daily. 12/20/17 03/20/18 Yes Charlott Rakes, MD  gabapentin (NEURONTIN) 400 MG capsule Take 1 capsule (400 mg total) by mouth 3 (three) times daily. 12/20/17  Yes Newlin, Charlane Ferretti, MD  insulin aspart (NOVOLOG) 100 UNIT/ML injection Inject 0-15 Units into the skin 3 (three) times daily with meals. Sliding scale  CBG 70 - 120: 0 units: CBG 121 - 140: 2 units; CBG 140 - 200: 4 units; CBG 201 - 240: 6 units; CBG 240 - 300: 8 units;CBG 300 - 350: 12 units; CBG 351 - 400: 16 units; CBG > 400 : 16 units and notify your  MD 12/20/17  Yes Charlott Rakes, MD  Insulin Glargine (LANTUS SOLOSTAR) 100 UNIT/ML Solostar Pen Inject 65 Units into the skin daily at 10 pm. 12/20/17  Yes Charlott Rakes, MD  potassium chloride (K-DUR) 10 MEQ tablet Take 1 tablet (10 mEq total) by mouth daily. 12/20/17 03/20/18 Yes Charlott Rakes, MD  rosuvastatin (CRESTOR) 40 MG tablet Take 1 tablet (40 mg total) by mouth daily. 12/22/17  Yes Newlin, Charlane Ferretti, MD  sacubitril-valsartan (ENTRESTO) 24-26 MG Take 1 tablet by mouth 2 (two) times daily. 12/20/17  Yes Charlott Rakes, MD  aspirin EC 81 MG tablet Take 1 tablet (81 mg total) by mouth daily. Patient not taking: Reported on 01/12/2018 02/02/17   Bonnielee Haff, MD  Blood Glucose Monitoring Suppl (TRUE METRIX METER) DEVI 1 each by Does not apply route 3 (three) times daily before meals. 02/15/17   Charlott Rakes, MD  glucose blood (TRUE METRIX BLOOD  GLUCOSE TEST) test strip Use 3 times daily before meals 02/15/17   Charlott Rakes, MD  TRUEPLUS LANCETS 28G MISC 1 each by Does not apply route 3 (three) times daily before meals. 02/15/17   Charlott Rakes, MD    No Known Allergies  Physical Exam  Vitals  Blood pressure (!) 155/100, pulse 84, temperature  98.7 F (37.1 C), temperature source Oral, resp. rate 18, height 5\' 7"  (1.702 m), weight 89.4 kg (197 lb), SpO2 96 %.   1. General well-developed, well-nourished male. Very pleasant  2. Normal affect and insight, Not Suicidal or Homicidal, Awake Alert, Oriented X 3.  3. No F.N deficits, grossly, patient moving all extremities.  4. Ears and Eyes appear Normal, Conjunctivae clear, PERRLA. Moist Oral Mucosa.  5. Supple Neck, No JVD, No cervical lymphadenopathy appriciated, No Carotid Bruits.  6. Symmetrical Chest wall movement, Good air movement bilaterally, CTAB.  7. RRR, No Gallops, Rubs or Murmurs, No Parasternal Heave.  8. Positive Bowel Sounds, Abdomen Soft, Non tender, No organomegaly appriciated,No rebound -guarding or rigidity.  9.  No Cyanosis, Normal Skin Turgor, No Skin Rash or Bruise.  10. Good muscle tone,  joints appear normal , no pedal edema.    Data Review  CBC Recent Labs  Lab 01/12/18 2020  WBC 4.6  HGB 12.5*  HCT 40.3  PLT 258  MCV 77.9*  MCH 24.2*  MCHC 31.0  RDW 15.7*   ------------------------------------------------------------------------------------------------------------------  Chemistries  Recent Labs  Lab 01/12/18 2020  NA 139  K 3.3*  CL 103  CO2 26  GLUCOSE 190*  BUN 20  CREATININE 1.11  CALCIUM 9.0   ------------------------------------------------------------------------------------------------------------------ estimated creatinine clearance is 83 mL/min (by C-G formula based on SCr of 1.11 mg/dL). ------------------------------------------------------------------------------------------------------------------ No  results for input(s): TSH, T4TOTAL, T3FREE, THYROIDAB in the last 72 hours.  Invalid input(s): FREET3   Coagulation profile No results for input(s): INR, PROTIME in the last 168 hours. ------------------------------------------------------------------------------------------------------------------- No results for input(s): DDIMER in the last 72 hours. -------------------------------------------------------------------------------------------------------------------  Cardiac Enzymes No results for input(s): CKMB, TROPONINI, MYOGLOBIN in the last 168 hours.  Invalid input(s): CK ------------------------------------------------------------------------------------------------------------------ Invalid input(s): POCBNP   ---------------------------------------------------------------------------------------------------------------  Urinalysis    Component Value Date/Time   COLORURINE YELLOW 01/12/2018 2050   APPEARANCEUR CLEAR 01/12/2018 2050   LABSPEC 1.025 01/12/2018 2050   PHURINE 6.0 01/12/2018 2050   GLUCOSEU 50 (A) 01/12/2018 2050   HGBUR SMALL (A) 01/12/2018 2050   BILIRUBINUR NEGATIVE 01/12/2018 2050   BILIRUBINUR neg 03/30/2016 1116   [place] 01/12/2018 2050   PROTEINUR >=300 (A) 01/12/2018 2050   UROBILINOGEN 0.2 03/30/2016 1116   UROBILINOGEN 1.0 03/10/2014 2230   NITRITE NEGATIVE 01/12/2018 2050   LEUKOCYTESUR NEGATIVE 01/12/2018 2050    ----------------------------------------------------------------------------------------------------------------   Imaging results:   Dg Chest 2 View  Result Date: 01/12/2018 CLINICAL DATA:  Initial evaluation for acute syncope. EXAM: CHEST  2 VIEW COMPARISON:  Prior radiograph from 10/20/17. FINDINGS: Mild cardiomegaly, stable.  Mediastinal silhouette. The lungs are normally inflated. No airspace consolidation. Superimposed mild left basilar atelectasis. Mild diffuse vascular congestion without frank pulmonary edema.  No pleural effusion. No pneumothorax. No acute osseous abnormality identified. IMPRESSION: 1. Stable cardiomegaly with mild diffuse pulmonary vascular congestion without frank pulmonary edema. 2. Superimposed mild left basilar atelectasis. Electronically Signed   By: Jeannine Boga M.D.   On: 01/12/2018 23:56   Ct Head Wo Contrast  Result Date: 01/12/2018 CLINICAL DATA:  Patient passed out and fell forward onto a wooden floor yesterday. Headache and neck pain. EXAM: CT HEAD WITHOUT CONTRAST CT CERVICAL SPINE WITHOUT CONTRAST TECHNIQUE: Multidetector CT imaging of the head and cervical spine was performed following the standard protocol without intravenous contrast. Multiplanar CT image reconstructions of the cervical spine were also generated. COMPARISON:  None. FINDINGS: CT HEAD FINDINGS Brain: No evidence of acute infarction, hemorrhage, hydrocephalus, extra-axial collection  or mass lesion/mass effect. Vascular: No hyperdense vessel or unexpected calcification. Skull: Normal. Negative for fracture or focal lesion. Sinuses/Orbits: No acute finding. Other: None. CT CERVICAL SPINE FINDINGS Alignment: Reversal of the usual cervical lordosis without anterior subluxation. This may be due to patient positioning but ligamentous injury or muscle spasm could also have this appearance and are not excluded. Normal alignment of the facet joints. C1-2 articulation appears intact. Skull base and vertebrae: Skull base appears intact. No vertebral compression deformities. No focal bone lesion or bone destruction. Soft tissues and spinal canal: No prevertebral soft tissue swelling. No paraspinal soft tissue mass or infiltration. Disc levels: Degenerative changes with disc space narrowing and endplate hypertrophic changes at C4-5, C5-6, and C6-7 levels. Upper chest: Lung apices are clear. Other: None. IMPRESSION: 1. No acute intracranial abnormalities. 2. Nonspecific reversal of the usual cervical lordosis. Mild  degenerative changes in the cervical spine. No acute displaced fractures identified. Electronically Signed   By: Lucienne Capers M.D.   On: 01/12/2018 23:55   Ct Cervical Spine Wo Contrast  Result Date: 01/12/2018 CLINICAL DATA:  Patient passed out and fell forward onto a wooden floor yesterday. Headache and neck pain. EXAM: CT HEAD WITHOUT CONTRAST CT CERVICAL SPINE WITHOUT CONTRAST TECHNIQUE: Multidetector CT imaging of the head and cervical spine was performed following the standard protocol without intravenous contrast. Multiplanar CT image reconstructions of the cervical spine were also generated. COMPARISON:  None. FINDINGS: CT HEAD FINDINGS Brain: No evidence of acute infarction, hemorrhage, hydrocephalus, extra-axial collection or mass lesion/mass effect. Vascular: No hyperdense vessel or unexpected calcification. Skull: Normal. Negative for fracture or focal lesion. Sinuses/Orbits: No acute finding. Other: None. CT CERVICAL SPINE FINDINGS Alignment: Reversal of the usual cervical lordosis without anterior subluxation. This may be due to patient positioning but ligamentous injury or muscle spasm could also have this appearance and are not excluded. Normal alignment of the facet joints. C1-2 articulation appears intact. Skull base and vertebrae: Skull base appears intact. No vertebral compression deformities. No focal bone lesion or bone destruction. Soft tissues and spinal canal: No prevertebral soft tissue swelling. No paraspinal soft tissue mass or infiltration. Disc levels: Degenerative changes with disc space narrowing and endplate hypertrophic changes at C4-5, C5-6, and C6-7 levels. Upper chest: Lung apices are clear. Other: None. IMPRESSION: 1. No acute intracranial abnormalities. 2. Nonspecific reversal of the usual cervical lordosis. Mild degenerative changes in the cervical spine. No acute displaced fractures identified. Electronically Signed   By: Lucienne Capers M.D.   On: 01/12/2018 23:55     My personal review of EKG: Rhythm NSR, rate of 59 bpm with left axis deviation and nonspecific ST changes  Assessment & Plan  1. Syncopal episode      CT of the head negative for acute events     Check echocardiogram     Serial troponins      Neurochecks 2. History of congestive heart failure ejection fraction 20-25%     Needs cardiology for placement of defibrillator             DVT Prophylaxis Lovenox  AM Labs Ordered, also please review Full Orders    Code Status full  Disposition Plan: Home  Time spent in minutes : 42 minutes  Condition GUARDED   @SIGNATURE @

## 2018-01-13 NOTE — ED Notes (Signed)
Called pharmacy to verify medications

## 2018-01-14 ENCOUNTER — Observation Stay (HOSPITAL_BASED_OUTPATIENT_CLINIC_OR_DEPARTMENT_OTHER): Payer: Medicaid Other

## 2018-01-14 DIAGNOSIS — Z89421 Acquired absence of other right toe(s): Secondary | ICD-10-CM | POA: Diagnosis not present

## 2018-01-14 DIAGNOSIS — I429 Cardiomyopathy, unspecified: Secondary | ICD-10-CM | POA: Diagnosis present

## 2018-01-14 DIAGNOSIS — R55 Syncope and collapse: Secondary | ICD-10-CM | POA: Diagnosis present

## 2018-01-14 DIAGNOSIS — Z89411 Acquired absence of right great toe: Secondary | ICD-10-CM | POA: Diagnosis not present

## 2018-01-14 DIAGNOSIS — E1142 Type 2 diabetes mellitus with diabetic polyneuropathy: Secondary | ICD-10-CM | POA: Diagnosis present

## 2018-01-14 DIAGNOSIS — I5042 Chronic combined systolic (congestive) and diastolic (congestive) heart failure: Secondary | ICD-10-CM | POA: Diagnosis present

## 2018-01-14 DIAGNOSIS — D72819 Decreased white blood cell count, unspecified: Secondary | ICD-10-CM | POA: Diagnosis not present

## 2018-01-14 DIAGNOSIS — D509 Iron deficiency anemia, unspecified: Secondary | ICD-10-CM | POA: Diagnosis present

## 2018-01-14 DIAGNOSIS — I110 Hypertensive heart disease with heart failure: Secondary | ICD-10-CM | POA: Diagnosis present

## 2018-01-14 DIAGNOSIS — E876 Hypokalemia: Secondary | ICD-10-CM | POA: Diagnosis not present

## 2018-01-14 DIAGNOSIS — W19XXXA Unspecified fall, initial encounter: Secondary | ICD-10-CM | POA: Diagnosis present

## 2018-01-14 DIAGNOSIS — I5030 Unspecified diastolic (congestive) heart failure: Secondary | ICD-10-CM

## 2018-01-14 DIAGNOSIS — E785 Hyperlipidemia, unspecified: Secondary | ICD-10-CM | POA: Diagnosis present

## 2018-01-14 DIAGNOSIS — Z7982 Long term (current) use of aspirin: Secondary | ICD-10-CM | POA: Diagnosis not present

## 2018-01-14 DIAGNOSIS — Z9119 Patient's noncompliance with other medical treatment and regimen: Secondary | ICD-10-CM | POA: Diagnosis not present

## 2018-01-14 DIAGNOSIS — E7800 Pure hypercholesterolemia, unspecified: Secondary | ICD-10-CM

## 2018-01-14 DIAGNOSIS — I2510 Atherosclerotic heart disease of native coronary artery without angina pectoris: Secondary | ICD-10-CM | POA: Diagnosis present

## 2018-01-14 DIAGNOSIS — Z79899 Other long term (current) drug therapy: Secondary | ICD-10-CM | POA: Diagnosis not present

## 2018-01-14 DIAGNOSIS — Z794 Long term (current) use of insulin: Secondary | ICD-10-CM | POA: Diagnosis not present

## 2018-01-14 LAB — ECHOCARDIOGRAM COMPLETE
HEIGHTINCHES: 67 in
WEIGHTICAEL: 3158.4 [oz_av]

## 2018-01-14 LAB — CBC WITH DIFFERENTIAL/PLATELET
BASOS ABS: 0 10*3/uL (ref 0.0–0.1)
Basophils Relative: 0 %
Eosinophils Absolute: 0 10*3/uL (ref 0.0–0.7)
Eosinophils Relative: 1 %
HEMATOCRIT: 41.4 % (ref 39.0–52.0)
Hemoglobin: 13.2 g/dL (ref 13.0–17.0)
LYMPHS ABS: 1.6 10*3/uL (ref 0.7–4.0)
LYMPHS PCT: 45 %
MCH: 24.8 pg — ABNORMAL LOW (ref 26.0–34.0)
MCHC: 31.9 g/dL (ref 30.0–36.0)
MCV: 77.8 fL — AB (ref 78.0–100.0)
Monocytes Absolute: 0.2 10*3/uL (ref 0.1–1.0)
Monocytes Relative: 7 %
NEUTROS ABS: 1.7 10*3/uL (ref 1.7–7.7)
Neutrophils Relative %: 47 %
Platelets: 231 10*3/uL (ref 150–400)
RBC: 5.32 MIL/uL (ref 4.22–5.81)
RDW: 15.7 % — ABNORMAL HIGH (ref 11.5–15.5)
WBC: 3.5 10*3/uL — AB (ref 4.0–10.5)

## 2018-01-14 LAB — GLUCOSE, CAPILLARY
GLUCOSE-CAPILLARY: 284 mg/dL — AB (ref 65–99)
GLUCOSE-CAPILLARY: 259 mg/dL — AB (ref 65–99)
Glucose-Capillary: 217 mg/dL — ABNORMAL HIGH (ref 65–99)
Glucose-Capillary: 148 mg/dL — ABNORMAL HIGH (ref 65–99)

## 2018-01-14 LAB — COMPREHENSIVE METABOLIC PANEL
ALK PHOS: 69 U/L (ref 38–126)
ALT: 18 U/L (ref 17–63)
AST: 18 U/L (ref 15–41)
Albumin: 2.7 g/dL — ABNORMAL LOW (ref 3.5–5.0)
Anion gap: 9 (ref 5–15)
BILIRUBIN TOTAL: 0.6 mg/dL (ref 0.3–1.2)
BUN: 15 mg/dL (ref 6–20)
CALCIUM: 8.8 mg/dL — AB (ref 8.9–10.3)
CO2: 22 mmol/L (ref 22–32)
CREATININE: 0.91 mg/dL (ref 0.61–1.24)
Chloride: 105 mmol/L (ref 101–111)
GFR calc Af Amer: >60 mL/min (ref >60)
Glucose, Bld: 241 mg/dL — ABNORMAL HIGH (ref 65–99)
Potassium: 3.8 mmol/L (ref 3.5–5.1)
Sodium: 136 mmol/L (ref 135–145)
TOTAL PROTEIN: 6.1 g/dL — AB (ref 6.5–8.1)

## 2018-01-14 LAB — PHOSPHORUS: Phosphorus: 3 mg/dL (ref 2.5–4.6)

## 2018-01-14 LAB — MAGNESIUM: MAGNESIUM: 1.7 mg/dL (ref 1.7–2.4)

## 2018-01-14 MED ORDER — INSULIN ASPART 100 UNIT/ML ~~LOC~~ SOLN
0.0000 [IU] | Freq: Three times a day (TID) | SUBCUTANEOUS | Status: DC
  Administered 2018-01-14: 8 [IU] via SUBCUTANEOUS
  Administered 2018-01-14: 2 [IU] via SUBCUTANEOUS
  Administered 2018-01-14: 5 [IU] via SUBCUTANEOUS
  Administered 2018-01-15: 8 [IU] via SUBCUTANEOUS
  Administered 2018-01-15 – 2018-01-16 (×2): 2 [IU] via SUBCUTANEOUS

## 2018-01-14 MED ORDER — SPIRONOLACTONE 25 MG PO TABS
25.0000 mg | ORAL_TABLET | Freq: Every day | ORAL | Status: DC
  Administered 2018-01-14 – 2018-01-16 (×3): 25 mg via ORAL
  Filled 2018-01-14 (×3): qty 1

## 2018-01-14 MED ORDER — FERROUS SULFATE 325 (65 FE) MG PO TABS
325.0000 mg | ORAL_TABLET | Freq: Every day | ORAL | Status: DC
  Administered 2018-01-16: 325 mg via ORAL
  Filled 2018-01-14 (×2): qty 1

## 2018-01-14 MED ORDER — POTASSIUM CHLORIDE CRYS ER 10 MEQ PO TBCR
10.0000 meq | EXTENDED_RELEASE_TABLET | Freq: Every day | ORAL | Status: DC
  Administered 2018-01-14 – 2018-01-16 (×3): 10 meq via ORAL
  Filled 2018-01-14 (×5): qty 1

## 2018-01-14 MED ORDER — INSULIN GLARGINE 100 UNIT/ML ~~LOC~~ SOLN
65.0000 [IU] | Freq: Every day | SUBCUTANEOUS | Status: DC
  Administered 2018-01-14 – 2018-01-15 (×2): 65 [IU] via SUBCUTANEOUS
  Filled 2018-01-14 (×2): qty 0.65

## 2018-01-14 MED ORDER — INSULIN ASPART 100 UNIT/ML ~~LOC~~ SOLN
4.0000 [IU] | Freq: Three times a day (TID) | SUBCUTANEOUS | Status: DC
  Administered 2018-01-14 – 2018-01-16 (×6): 4 [IU] via SUBCUTANEOUS

## 2018-01-14 MED ORDER — ASPIRIN EC 81 MG PO TBEC
81.0000 mg | DELAYED_RELEASE_TABLET | Freq: Every day | ORAL | Status: DC
  Administered 2018-01-14 – 2018-01-16 (×3): 81 mg via ORAL
  Filled 2018-01-14 (×3): qty 1

## 2018-01-14 MED ORDER — HEPARIN SODIUM (PORCINE) 5000 UNIT/ML IJ SOLN
5000.0000 [IU] | Freq: Three times a day (TID) | INTRAMUSCULAR | Status: DC
  Administered 2018-01-14 (×2): 5000 [IU] via SUBCUTANEOUS
  Filled 2018-01-14 (×2): qty 1

## 2018-01-14 NOTE — Plan of Care (Signed)
  Nutrition: Adequate nutrition will be maintained 01/14/2018 0129 - Completed/Met by Evert Kohl, RN   Coping: Level of anxiety will decrease 01/14/2018 0129 - Completed/Met by Evert Kohl, RN   Elimination: Will not experience complications related to bowel motility 01/14/2018 0129 - Completed/Met by Evert Kohl, RN   Safety: Ability to remain free from injury will improve 01/14/2018 0129 - Completed/Met by Garnett-Mellinger, Sophronia Simas, RN

## 2018-01-14 NOTE — Progress Notes (Signed)
Patient continues to refuse bed alarm.

## 2018-01-14 NOTE — Progress Notes (Signed)
Cardiology Consultation:   Patient ID: Jeffrey [name]; [id number]; July 07, 1966   Admit date: 01/12/2018 Date of Consult: 01/14/2018  Primary Care Provider: Charlott Rakes, MD Primary Cardiologist: Mertie Moores, MD    Patient Profile:   Jeffrey [name] is a 52 y.o. male with a hx of chronic systolic and diastolic heart failure, hypertensive heart disease, hyperlipidemia, moderate, non-objective coronary artery disease, and diabetes who is being seen today for the evaluation of syncope at the request of Dr Merton Border.  History of Present Illness:   Jeffrey [name] was admitted to the hospital 01/2017 for acute on chronic heart failure.  During that hospitalization he had an echocardiogram that revealed LVEF 20-25%.  He subsequently had a left heart catheterization that revealed moderate, non-obstructive coronary artery disease.  His ejection fraction by left ventriculography was 10-15% with severe, diffuse hypokinesis.  It was felt that his systolic dysfunction was nonischemic.  He was diuresed with IV Lasix. A recommendation was made for a life vest prior to discharge.  He followed up with [name], PA-C on 02/15/17.  His Lasix was increased and he was started on Entresto.  He has not been seen by cardiology since that time.  Jeffrey [name] was admitted 01/13/18 after an episode of syncope.  He reports that he was sitting on the side of his bed eating when he passed out.  There is no preceding chest pain, shortness of breath, lightheadedness, or palpitations.  He has not had any other similar episodes.  He has been eating and drinking well and does not think that he was dehydrated.  He denies lightheadedness or dizziness in general.  He fell to the floor and is unsure if he hit his head.  He states that he has not seen cardiology due to missing his appointments.  He has reportedly been compliant with carvedilol and Entresto.  Overall he has been doing well and has not experienced any lower extremity  edema, orthopnea, PND, chest pain, or shortness of breath.  His mother is in the room and states that she is unsure whether he is actually been taking his medications as prescribed.   Past Medical History:  Diagnosis Date  . CAD (coronary artery disease)    a. cath 01/31/17: 60% 1st RPLB, 60% dist RCA, 55% prox RCA, 10% pro LAD --> Rx TX.   Marland Kitchen Chronic systolic CHF (congestive heart failure) ([place]) 01/28/2017   1. Echo 01/29/17:  EF 20-25, normal wall motion, mild LAE // 2. EF 10-15 by [facility] 01/2017   . Diabetes mellitus   . Diabetic foot infection ([place]) 03/2016   RT FOOT  . HTN (hypertension)   . Hyperlipidemia   . NICM (nonischemic cardiomyopathy) ([place]) 02/15/2017   1. Mod non-obs CAD on LHC in 01/2017 - CAD does not explain cardiomyopathy    Past Surgical History:  Procedure Laterality Date  . AMPUTATION Right 04/01/2016   Procedure: Right Great Toe Amputation;  Surgeon: Newt Minion, MD;  Location: [place] [facility];  Service: Orthopedics;  Laterality: Right;  . AMPUTATION Right 06/19/2016   Procedure: AMPUTATION SECOND TOE;  Surgeon: Marybelle Killings, MD;  Location: [facility];  Service: Orthopedics;  Laterality: Right;  . AMPUTATION TOE Right   . BACK SURGERY     for abscess  . RIGHT/LEFT HEART CATH AND CORONARY ANGIOGRAPHY N/A 01/31/2017   Procedure: Right/Left Heart Cath and Coronary Angiography;  Surgeon: Troy Sine, MD;  Location: [place] [facility];  Service: Cardiovascular;  Laterality: N/A;  Home Medications:  Prior to Admission medications   Medication Sig Start Date End Date Taking? Authorizing Provider  acetaminophen (TYLENOL) 500 MG tablet Take 1,000 mg by mouth every 6 (six) hours as needed for headache (pain).   Yes [provider]  albuterol (PROVENTIL HFA;VENTOLIN HFA) 108 (90 Base) MCG/ACT inhaler Inhale 1-2 puffs into the lungs every 6 (six) hours as needed for wheezing or shortness of breath. 11/15/16  Yes Recardo Evangelist, PA-C  carvedilol (COREG) 6.25 MG tablet Take 1 tablet  (6.25 mg total) by mouth 2 (two) times daily with a meal. 12/20/17  Yes Newlin, Enobong, MD  cyclobenzaprine (FLEXERIL) 5 MG tablet Take 1 tablet (5 mg total) at bedtime by mouth. Patient taking differently: Take 5 mg by mouth at bedtime as needed for muscle spasms.  10/21/17  Yes Caccavale, Sophia, PA-C  ferrous sulfate 325 (65 FE) MG tablet Take 1 tablet (325 mg total) by mouth 2 (two) times daily with a meal. Patient taking differently: Take 325 mg by mouth daily with breakfast.  04/03/16  Yes Dhungel, Nishant, MD  furosemide (LASIX) 40 MG tablet Take 1 tablet (40 mg total) by mouth daily. 12/20/17 03/20/18 Yes Charlott Rakes, MD  gabapentin (NEURONTIN) 400 MG capsule Take 1 capsule (400 mg total) by mouth 3 (three) times daily. 12/20/17  Yes Newlin, Charlane Ferretti, MD  insulin aspart (NOVOLOG) 100 UNIT/ML injection Inject 0-15 Units into the skin 3 (three) times daily with meals. Sliding scale  CBG 70 - 120: 0 units: CBG 121 - 140: 2 units; CBG 140 - 200: 4 units; CBG 201 - 240: 6 units; CBG 240 - 300: 8 units;CBG 300 - 350: 12 units; CBG 351 - 400: 16 units; CBG > 400 : 16 units and notify your  MD 12/20/17  Yes Charlott Rakes, MD  Insulin Glargine (LANTUS SOLOSTAR) 100 UNIT/ML Solostar Pen Inject 65 Units into the skin daily at 10 pm. 12/20/17  Yes Charlott Rakes, MD  potassium chloride (K-DUR) 10 MEQ tablet Take 1 tablet (10 mEq total) by mouth daily. 12/20/17 03/20/18 Yes Charlott Rakes, MD  rosuvastatin (CRESTOR) 40 MG tablet Take 1 tablet (40 mg total) by mouth daily. 12/22/17  Yes Newlin, Charlane Ferretti, MD  sacubitril-valsartan (ENTRESTO) 24-26 MG Take 1 tablet by mouth 2 (two) times daily. 12/20/17  Yes Charlott Rakes, MD  aspirin EC 81 MG tablet Take 1 tablet (81 mg total) by mouth daily. Patient not taking: Reported on 01/12/2018 02/02/17   Bonnielee Haff, MD  Blood Glucose Monitoring Suppl (TRUE METRIX METER) DEVI 1 each by Does not apply route 3 (three) times daily before meals. 02/15/17   Charlott Rakes, MD    glucose blood (TRUE METRIX BLOOD GLUCOSE TEST) test strip Use 3 times daily before meals 02/15/17   Charlott Rakes, MD  TRUEPLUS LANCETS 28G MISC 1 each by Does not apply route 3 (three) times daily before meals. 02/15/17   Charlott Rakes, MD    Inpatient Medications: Scheduled Meds: . carvedilol  6.25 mg Oral BID WC  . cyclobenzaprine  5 mg Oral QHS  . enoxaparin (LOVENOX) injection  40 mg Subcutaneous Daily  . furosemide  40 mg Oral Daily  . gabapentin  400 mg Oral TID  . insulin aspart  0-15 Units Subcutaneous TID WC  . insulin aspart  4 Units Subcutaneous TID WC  . rosuvastatin  40 mg Oral Daily  . sacubitril-valsartan  1 tablet Oral BID  . sodium chloride flush  3 mL Intravenous Q12H   Continuous  Infusions: . sodium chloride     PRN Meds: sodium chloride, acetaminophen, albuterol, ondansetron **OR** ondansetron (ZOFRAN) IV, sodium chloride flush, zolpidem  Allergies:   No Known Allergies  Social History:   Social History   Socioeconomic History  . Marital status: Significant Other    Spouse name: Not on file  . Number of children: Not on file  . Years of education: Not on file  . Highest education level: Not on file  Social Needs  . Financial resource strain: Not on file  . Food insecurity - worry: Not on file  . Food insecurity - inability: Not on file  . Transportation needs - medical: Not on file  . Transportation needs - non-medical: Not on file  Occupational History  . Not on file  Tobacco Use  . Smoking status: Never Smoker  . Smokeless tobacco: Never Used  Substance and Sexual Activity  . Alcohol use: No  . Drug use: No  . Sexual activity: Not on file  Other Topics Concern  . Not on file  Social History Narrative  . Not on file    Family History:    Family History  Problem Relation Age of Onset  . Diabetes Mother   . Hypertension Mother   . Diabetes Father   . Heart attack Father   . Diabetes Sister   . Heart attack Maternal Grandmother       ROS:  Please see the history of present illness.   All other ROS reviewed and negative.     Physical Exam/Data:   Vitals:   01/13/18 2118 01/14/18 0547 01/14/18 0800 01/14/18 0838  BP: (!) 146/92 127/80 127/82 127/82  Pulse: 81 80 84 82  Resp: 18 18 18    Temp: 98.1 F (36.7 C) 98.4 F (36.9 C) 98 F (36.7 C)   TempSrc: Oral Oral Oral   SpO2: 98% 97%    Weight:  197 lb 6.4 oz (89.5 kg)    Height:        Intake/Output Summary (Last 24 hours) at 01/14/2018 1119 Last data filed at 01/14/2018 0954 Gross per 24 hour  Intake 1042 ml  Output 400 ml  Net 642 ml   Filed Weights   01/12/18 2013 01/13/18 1425 01/14/18 0547  Weight: 197 lb (89.4 kg) 198 lb (89.8 kg) 197 lb 6.4 oz (89.5 kg)   VS:  BP 127/82 (BP Location: Right Arm)   Pulse 82   Temp 98 F (36.7 C) (Oral)   Resp 18   Ht 5\' 7"  (1.702 m)   Wt 197 lb 6.4 oz (89.5 kg)   SpO2 97%   BMI 30.92 kg/m  , BMI Body mass index is 30.92 kg/m. GENERAL:  Well appearing.  No acute distress HEENT: Pupils equal round and reactive, fundi not visualized, oral mucosa unremarkable NECK:  No jugular venous distention, waveform within normal limits, carotid upstroke brisk and symmetric, no bruits LUNGS:  Clear to auscultation bilaterally.  No crackles, wheezes, or rhonchi HEART:  RRR.  PMI not displaced or sustained,S1 and S2 within normal limits, no S3, no S4, no clicks, no rubs, no murmurs ABD:  Flat, positive bowel sounds normal in frequency in pitch, no bruits, no rebound, no guarding, no midline pulsatile mass, no hepatomegaly, no splenomegaly EXT:  2 plus pulses throughout, no edema, no cyanosis no clubbing SKIN:  No rashes no nodules NEURO:  Cranial nerves II through XII grossly intact, motor grossly intact throughout PSYCH:  Cognitively intact, oriented to person place  and time   EKG:  The EKG was personally reviewed and demonstrates: Sinus rhythm.  Rate 89 bpm.  Left axis deviation. Telemetry:  Telemetry was personally  reviewed and demonstrates: Sinus rhythm.  No events  Relevant CV Studies: Left heart catheterization 01/31/17:  1st RPLB lesion, 60 %stenosed.  Dist RCA lesion, 60 %stenosed.  Prox RCA lesion, 55 %stenosed.  Prox LAD lesion, 10 %stenosed.  There is severe left ventricular systolic dysfunction.  LV end diastolic pressure is mildly elevated.   Severe global LV dysfunction with an ejection fraction of 10-15%.  The pattern is one of a nonischemic cardiomyopathy.  Echo 01/29/17: Study Conclusions  - Left ventricle: The cavity size was mildly dilated. Systolic   function was severely reduced. The estimated ejection fraction   was in the range of 20% to 25%. Wall motion was normal; there   were no regional wall motion abnormalities. - Left atrium: The atrium was mildly dilated.    Laboratory Data:  Chemistry Recent Labs  Lab 01/12/18 2020 01/13/18 0221 01/13/18 0857 01/14/18 0808  NA 139  --  140 136  K 3.3*  --  3.6 3.8  CL 103  --  107 105  CO2 26  --  25 22  GLUCOSE 190*  --  124* 241*  BUN 20  --  16 15  CREATININE 1.11 0.93 0.89 0.91  CALCIUM 9.0  --  8.8* 8.8*  GFRNONAA >60 >60 >60 >60  GFRAA >60 >60 >60 >60  ANIONGAP 10  --  8 9    Recent Labs  Lab 01/14/18 0808  PROT 6.1*  ALBUMIN 2.7*  AST 18  ALT 18  ALKPHOS 69  BILITOT 0.6   Hematology Recent Labs  Lab 01/13/18 0221 01/13/18 1511 01/14/18 0808  WBC 4.5 4.2 3.5*  RBC 5.27 5.21 5.32  HGB 12.8* 12.7* 13.2  HCT 41.2 40.7 41.4  MCV 78.2 78.1 77.8*  MCH 24.3* 24.4* 24.8*  MCHC 31.1 31.2 31.9  RDW 15.7* 15.8* 15.7*  PLT 248 243 231   Cardiac Enzymes Recent Labs  Lab 01/12/18 2325 01/13/18 0221 01/13/18 0857 01/13/18 1511  TROPONINI <0.03 <0.03 <0.03 <0.03   No results for input(s): TROPIPOC in the last 168 hours.  BNP Recent Labs  Lab 01/12/18 2324  BNP 34.3    DDimer No results for input(s): DDIMER in the last 168 hours.  Radiology/Studies:  Dg Chest 2 View  Result Date:  01/12/2018 CLINICAL DATA:  Initial evaluation for acute syncope. EXAM: CHEST  2 VIEW COMPARISON:  Prior radiograph from 10/20/17. FINDINGS: Mild cardiomegaly, stable.  Mediastinal silhouette. The lungs are normally inflated. No airspace consolidation. Superimposed mild left basilar atelectasis. Mild diffuse vascular congestion without frank pulmonary edema. No pleural effusion. No pneumothorax. No acute osseous abnormality identified. IMPRESSION: 1. Stable cardiomegaly with mild diffuse pulmonary vascular congestion without frank pulmonary edema. 2. Superimposed mild left basilar atelectasis. Electronically Signed   By: Jeannine Boga M.D.   On: 01/12/2018 23:56   Ct Head Wo Contrast  Result Date: 01/12/2018 CLINICAL DATA:  Patient passed out and fell forward onto a wooden floor yesterday. Headache and neck pain. EXAM: CT HEAD WITHOUT CONTRAST CT CERVICAL SPINE WITHOUT CONTRAST TECHNIQUE: Multidetector CT imaging of the head and cervical spine was performed following the standard protocol without intravenous contrast. Multiplanar CT image reconstructions of the cervical spine were also generated. COMPARISON:  None. FINDINGS: CT HEAD FINDINGS Brain: No evidence of acute infarction, hemorrhage, hydrocephalus, extra-axial collection or mass lesion/mass effect.  Vascular: No hyperdense vessel or unexpected calcification. Skull: Normal. Negative for fracture or focal lesion. Sinuses/Orbits: No acute finding. Other: None. CT CERVICAL SPINE FINDINGS Alignment: Reversal of the usual cervical lordosis without anterior subluxation. This may be due to patient positioning but ligamentous injury or muscle spasm could also have this appearance and are not excluded. Normal alignment of the facet joints. C1-2 articulation appears intact. Skull base and vertebrae: Skull base appears intact. No vertebral compression deformities. No focal bone lesion or bone destruction. Soft tissues and spinal canal: No prevertebral soft tissue  swelling. No paraspinal soft tissue mass or infiltration. Disc levels: Degenerative changes with disc space narrowing and endplate hypertrophic changes at C4-5, C5-6, and C6-7 levels. Upper chest: Lung apices are clear. Other: None. IMPRESSION: 1. No acute intracranial abnormalities. 2. Nonspecific reversal of the usual cervical lordosis. Mild degenerative changes in the cervical spine. No acute displaced fractures identified. Electronically Signed   By: Lucienne Capers M.D.   On: 01/12/2018 23:55   Ct Cervical Spine Wo Contrast  Result Date: 01/12/2018 CLINICAL DATA:  Patient passed out and fell forward onto a wooden floor yesterday. Headache and neck pain. EXAM: CT HEAD WITHOUT CONTRAST CT CERVICAL SPINE WITHOUT CONTRAST TECHNIQUE: Multidetector CT imaging of the head and cervical spine was performed following the standard protocol without intravenous contrast. Multiplanar CT image reconstructions of the cervical spine were also generated. COMPARISON:  None. FINDINGS: CT HEAD FINDINGS Brain: No evidence of acute infarction, hemorrhage, hydrocephalus, extra-axial collection or mass lesion/mass effect. Vascular: No hyperdense vessel or unexpected calcification. Skull: Normal. Negative for fracture or focal lesion. Sinuses/Orbits: No acute finding. Other: None. CT CERVICAL SPINE FINDINGS Alignment: Reversal of the usual cervical lordosis without anterior subluxation. This may be due to patient positioning but ligamentous injury or muscle spasm could also have this appearance and are not excluded. Normal alignment of the facet joints. C1-2 articulation appears intact. Skull base and vertebrae: Skull base appears intact. No vertebral compression deformities. No focal bone lesion or bone destruction. Soft tissues and spinal canal: No prevertebral soft tissue swelling. No paraspinal soft tissue mass or infiltration. Disc levels: Degenerative changes with disc space narrowing and endplate hypertrophic changes at C4-5,  C5-6, and C6-7 levels. Upper chest: Lung apices are clear. Other: None. IMPRESSION: 1. No acute intracranial abnormalities. 2. Nonspecific reversal of the usual cervical lordosis. Mild degenerative changes in the cervical spine. No acute displaced fractures identified. Electronically Signed   By: Lucienne Capers M.D.   On: 01/12/2018 23:55    Assessment and Plan:   # Syncope:  # Chronic systolic and diastolic heart failure:  Mr. Strieter's episode of syncope is concerning for a cardiac arrhythmia.  Electrolytes are unremarkable and he has no evidence of heart failure on exam.  He was seated at the time, which makes orthostasis unlikely.  He also had no discomfort or triggers for vasovagal syncope.  Telemetry thus far has been unremarkable.  Given his reduced systolic function one year ago it is likely that his EF remains low.  He has been lost to follow-up.  Echocardiogram is pending.  If his LVEF remains less than 35%, would recommend ICD placement.  His compliance has been questionable.  It would be helpful to follow-up his with his pharmacy to ensure that he is actually receiving his medications at the recommended times and has been compliant with therapy for 3 months consistently.  Continue carvedilol and Entresto.  We will add spironolactone 25 mg daily.  Continue Lasix 40  mg p.o. daily.  # Hypertensive heart disease:  Blood pressure is well-controlled on carvedilol and Entresto.  Adding spinal lactone as above.  # Moderate non-obstructive CAD:  # Hyperlipidemia:  Asymptomatic.  Continue rosuvastatin, carvedilol, and add aspirin.  LDL was 158 on 12/20/17, with suggest that he is not compliant with his medications.   For questions or updates, please contact [facility] Please consult [email or web address] for contact info under Cardiology/STEMI.   Signed, Skeet Latch, MD  01/14/2018 11:19 AM

## 2018-01-14 NOTE — Progress Notes (Signed)
Refused bed alarm. Will continue to monitor patient. 

## 2018-01-14 NOTE — Progress Notes (Signed)
PROGRESS NOTE    Jeffrey [name]  PXT:[id number] DOB: 1965-12-18 DOA: 01/12/2018 PCP: Charlott Rakes, MD  Brief Narrative:  Jeffrey [name]  is a 52 y.o. male, with past medical history significant for chronic systolic and diastolic congestive heart failure, coronary artery disease presenting with a syncopal episode that happened yesterday at night. Patient lives at home with a friend and he passed out from a standing position. He denies any preceding chest pains or palpitations. Patient did not feel like coming to the hospital yesterday. No history of seizures. Patient has a history of congestive heart failure with ejection fraction 20-25% and he is supposed to follow-up with an echo to be evaluated for defibrillator placemen but failed to follow-up. Given concern for Cardiac Etiology, Cardiology was consulted and recommended repeating ECHO. Case was discussed with Dr. Oval Linsey and because EF remains low will have EP evaluate for AICD placement.   Assessment & Plan:   Active Problems:   Essential hypertension   Diabetic neuropathy ([place])   Uncontrolled type 2 diabetes mellitus with hyperglycemia, with long-term current use of insulin (HCC)   Anemia, iron deficiency   Chronic systolic heart failure (HCC)   NICM (nonischemic cardiomyopathy) ([place])   Coronary artery disease involving native coronary artery of native heart without angina pectoris   Hyperlipidemia   Syncope  Syncope with Fall -CT Head and Cervical Spine showed No acute intracranial abnormalities.  Nonspecific reversal of the usual cervical lordosis. Mild degenerative changes in the cervical spine. No acute displaced fractures identified. -ECHOCardiogram done and showed The estimated ejection fraction was 20%. Diffuse hypokinesis. Features are consistent with a pseudonormal left ventricular filling pattern, with concomitant abnormal relaxation and increased filling pressure (grade 2 diastolic dysfunction). Doppler parameters are  consistent with high ventricular filling pressure. -Troponin I x 4 were <0.03 -Orthostatic Vital Signs done and Negative -Neurochecks -Etiology highly concerning for Cardiac Arrythmia -Cardiology consulted for Evaluation -Given ECHOCardiogram findings and history may need AICD and Cardiology will consult EP of Evaluation -NPO at Midnight   Chronic Combined Systolic and Grade 2 Diastolic Congestive Heart failure with an Ejection Fraction of 20-25% -BNP was 34.3 -Heart Healthy/Carb Modified Diet -Currently not Decompensated  -Strict I's/O's, Daily Weights, SLIV, and Fluid Restrict 1500 mL -C/w Carvedilol 6.25 mg po BID, Sacubitril-Valsartan 1 tab po BID, Furosemide 40 mg po Daily -ECHOCardiogram done and showed The estimated ejection fraction was 20%. Diffuse hypokinesis. Features are consistent with a pseudonormal left ventricular filling pattern, with concomitant abnormal relaxation and increased filling pressure (grade 2 diastolic dysfunction). Doppler parameters are consistent with high ventricular filling pressure. -Cardiology Consulted for further evaluation -Cardiology adding Spironolactone 25 mg po Daily and are consulting EP for evaluation for AICD -Will make patient NPO after midnight -Continue to Monitor Volume Status Closely   Hx of CAD/NICM -No Chest Pain -Troponin I x 4 were <0.03 -C/w ASA 81 mg po Daily, Sacubitril-Valsartan 24-36 mg po BID, Carvedilol 6.25 mg po BID, and Rosuvstatin 40 mg po Daily  -Cardiology following and appreciate Further Recc's  Diabetes Mellitus Type 2 complicated by Neuropathy -Check HbA1c -Restarted Lantus 65 units sq Daily -C/w Moderate Novolog SSI AC and Novolog 4 untis TIDwm -CBG's ranging from 148-289 -C/w Gabapentin 400 mg po TID   HLD -C/w Sacubitril-Valsartan 24-36 mg po BID, Carvedilol 6.25 mg po BID, Spironolactone 25 mg po Daily, and Furosemide 40 mg po Daily   HLD -C/w Rosuvastatin 40 mg po Daily   Hx of Iron Deficiency  Anemia -[name] Hb/Hct went  from 12.7/40.7 -> 13.2/41.4 -Continue to Monitor for S/Sx of Bleeding -C/w Ferrous Sulfate 325 mg po Daily  -Repeat CBC in AM   Leukopenia -Likely Reactive -Continue to Monitor and Repeat CBC in AM  DVT prophylaxis: Heparin 5,000 units sq q8h Code Status: FULL CODE Family Communication: Discussed with Family at bedside Disposition Plan: Remain Inpatient for EP evaluation and possible   Consultants:   Cardiology Dr. Skeet Latch   Procedures:  ECHOCARDIOGRAM ------------------------------------------------------------------- Study Conclusions  - Left ventricle: The cavity size was normal. Wall thickness was   increased in a pattern of mild LVH. Systolic function was   severely reduced. The estimated ejection fraction was 20%.   Diffuse hypokinesis. Features are consistent with a pseudonormal   left ventricular filling pattern, with concomitant abnormal   relaxation and increased filling pressure (grade 2 diastolic   dysfunction). Doppler parameters are consistent with high   ventricular filling pressure. - Left atrium: The atrium was moderately dilated. - Right ventricle: The cavity size was mildly dilated. Systolic   function was moderately to severely reduced.   Antimicrobials: Antibiotics Given (last 72 hours)    None     Subjective: Seen and examined at beside and had no complaints. No CP or SOB. No recurrent Syncopal episodes. No nausea or vomiting.   Objective: Vitals:   01/13/18 2118 01/14/18 0547 01/14/18 0800 01/14/18 0838  BP: (!) 146/92 127/80 127/82 127/82  Pulse: 81 80 84 82  Resp: 18 18 18    Temp: 98.1 F (36.7 C) 98.4 F (36.9 C) 98 F (36.7 C)   TempSrc: Oral Oral Oral   SpO2: 98% 97%    Weight:  89.5 kg (197 lb 6.4 oz)    Height:        Intake/Output Summary (Last 24 hours) at 01/14/2018 1254 Last data filed at 01/14/2018 0954 Gross per 24 hour  Intake 1042 ml  Output 400 ml  Net 642 ml   Filed Weights    01/12/18 2013 01/13/18 1425 01/14/18 0547  Weight: 89.4 kg (197 lb) 89.8 kg (198 lb) 89.5 kg (197 lb 6.4 oz)   Examination: Physical Exam:  Constitutional: WN/WD AAM in  NAD and appears calm and comfortable Eyes: Lids and conjunctivae normal, sclerae anicteric  ENMT: External Ears, Nose appear normal. Grossly normal hearing. Mucous membranes are moist.   Neck: Appears normal, supple, no cervical masses, normal ROM, no appreciable thyromegaly, no JVD Respiratory: Diminished to auscultation bilaterally, no wheezing, rales, rhonchi or crackles. Normal respiratory effort and patient is not tachypenic. No accessory muscle use.  Cardiovascular: RRR, no appreciable murmurs/rubs/gallops. S1 and S2 auscultated. No extremity edema.  Abdomen: Soft, non-tender, non-distended. No masses palpated. No appreciable hepatosplenomegaly. Bowel sounds positive x4. Has some skin lesions on Abdomen.   GU: Deferred. Musculoskeletal: Has right toe amputations. Good ROM, no contractures. Normal strength and muscle tone.  Skin: Has old skin lesions on Abdomen from folliculitis. No induration; Warm and dry.  Neurologic: CN 2-12 grossly intact with no focal deficits. Strength 5/5 in all 4. Romberg sign cerebellar reflexes not assessed.  Psychiatric: Normal judgment and insight. Alert and oriented x 3. Normal mood and appropriate affect.   Data Reviewed: I have personally reviewed following labs and imaging studies  CBC: Recent Labs  Lab 01/12/18 2020 01/13/18 0221 01/13/18 1511 01/14/18 0808  WBC 4.6 4.5 4.2 3.5*  NEUTROABS  --   --   --  1.7  HGB 12.5* 12.8* 12.7* 13.2  HCT 40.3 41.2 40.7 41.4  MCV 77.9* 78.2 78.1 77.8*  PLT 258 248 243 970   Basic Metabolic Panel: Recent Labs  Lab 01/12/18 2020 01/12/18 2325 01/13/18 0221 01/13/18 0857 01/14/18 0808  NA 139  --   --  140 136  K 3.3*  --   --  3.6 3.8  CL 103  --   --  107 105  CO2 26  --   --  25 22  GLUCOSE 190*  --   --  124* 241*  BUN 20   --   --  16 15  CREATININE 1.11  --  0.93 0.89 0.91  CALCIUM 9.0  --   --  8.8* 8.8*  MG  --  1.8  --   --  1.7  PHOS  --   --   --   --  3.0   GFR: Estimated Creatinine Clearance: 101.4 mL/min (by C-G formula based on SCr of 0.91 mg/dL). Liver Function Tests: Recent Labs  Lab 01/14/18 0808  AST 18  ALT 18  ALKPHOS 69  BILITOT 0.6  PROT 6.1*  ALBUMIN 2.7*   No results for input(s): LIPASE, AMYLASE in the last 168 hours. No results for input(s): AMMONIA in the last 168 hours. Coagulation Profile: No results for input(s): INR, PROTIME in the last 168 hours. Cardiac Enzymes: Recent Labs  Lab 01/12/18 2325 01/13/18 0221 01/13/18 0857 01/13/18 1511  TROPONINI <0.03 <0.03 <0.03 <0.03   BNP (last 3 results) No results for input(s): PROBNP in the last 8760 hours. HbA1C: No results for input(s): HGBA1C in the last 72 hours. CBG: Recent Labs  Lab 01/13/18 1614 01/13/18 2122 01/14/18 0001 01/14/18 0749 01/14/18 1128  GLUCAP 254* 246* 289* 217* 148*   Lipid Profile: No results for input(s): CHOL, HDL, LDLCALC, TRIG, CHOLHDL, LDLDIRECT in the last 72 hours. Thyroid Function Tests: No results for input(s): TSH, T4TOTAL, FREET4, T3FREE, THYROIDAB in the last 72 hours. Anemia Panel: No results for input(s): VITAMINB12, FOLATE, FERRITIN, TIBC, IRON, RETICCTPCT in the last 72 hours. Sepsis Labs: No results for input(s): PROCALCITON, LATICACIDVEN in the last 168 hours.  No results found for this or any previous visit (from the past 240 hour(s)).   Radiology Studies: Dg Chest 2 View  Result Date: 01/12/2018 CLINICAL DATA:  Initial evaluation for acute syncope. EXAM: CHEST  2 VIEW COMPARISON:  Prior radiograph from 10/20/17. FINDINGS: Mild cardiomegaly, stable.  Mediastinal silhouette. The lungs are normally inflated. No airspace consolidation. Superimposed mild left basilar atelectasis. Mild diffuse vascular congestion without frank pulmonary edema. No pleural effusion. No  pneumothorax. No acute osseous abnormality identified. IMPRESSION: 1. Stable cardiomegaly with mild diffuse pulmonary vascular congestion without frank pulmonary edema. 2. Superimposed mild left basilar atelectasis. Electronically Signed   By: Jeannine Boga M.D.   On: 01/12/2018 23:56   Ct Head Wo Contrast  Result Date: 01/12/2018 CLINICAL DATA:  Patient passed out and fell forward onto a wooden floor yesterday. Headache and neck pain. EXAM: CT HEAD WITHOUT CONTRAST CT CERVICAL SPINE WITHOUT CONTRAST TECHNIQUE: Multidetector CT imaging of the head and cervical spine was performed following the standard protocol without intravenous contrast. Multiplanar CT image reconstructions of the cervical spine were also generated. COMPARISON:  None. FINDINGS: CT HEAD FINDINGS Brain: No evidence of acute infarction, hemorrhage, hydrocephalus, extra-axial collection or mass lesion/mass effect. Vascular: No hyperdense vessel or unexpected calcification. Skull: Normal. Negative for fracture or focal lesion. Sinuses/Orbits: No acute finding. Other: None. CT CERVICAL SPINE FINDINGS Alignment: Reversal of the usual cervical lordosis  without anterior subluxation. This may be due to patient positioning but ligamentous injury or muscle spasm could also have this appearance and are not excluded. Normal alignment of the facet joints. C1-2 articulation appears intact. Skull base and vertebrae: Skull base appears intact. No vertebral compression deformities. No focal bone lesion or bone destruction. Soft tissues and spinal canal: No prevertebral soft tissue swelling. No paraspinal soft tissue mass or infiltration. Disc levels: Degenerative changes with disc space narrowing and endplate hypertrophic changes at C4-5, C5-6, and C6-7 levels. Upper chest: Lung apices are clear. Other: None. IMPRESSION: 1. No acute intracranial abnormalities. 2. Nonspecific reversal of the usual cervical lordosis. Mild degenerative changes in the  cervical spine. No acute displaced fractures identified. Electronically Signed   By: Lucienne Capers M.D.   On: 01/12/2018 23:55   Ct Cervical Spine Wo Contrast  Result Date: 01/12/2018 CLINICAL DATA:  Patient passed out and fell forward onto a wooden floor yesterday. Headache and neck pain. EXAM: CT HEAD WITHOUT CONTRAST CT CERVICAL SPINE WITHOUT CONTRAST TECHNIQUE: Multidetector CT imaging of the head and cervical spine was performed following the standard protocol without intravenous contrast. Multiplanar CT image reconstructions of the cervical spine were also generated. COMPARISON:  None. FINDINGS: CT HEAD FINDINGS Brain: No evidence of acute infarction, hemorrhage, hydrocephalus, extra-axial collection or mass lesion/mass effect. Vascular: No hyperdense vessel or unexpected calcification. Skull: Normal. Negative for fracture or focal lesion. Sinuses/Orbits: No acute finding. Other: None. CT CERVICAL SPINE FINDINGS Alignment: Reversal of the usual cervical lordosis without anterior subluxation. This may be due to patient positioning but ligamentous injury or muscle spasm could also have this appearance and are not excluded. Normal alignment of the facet joints. C1-2 articulation appears intact. Skull base and vertebrae: Skull base appears intact. No vertebral compression deformities. No focal bone lesion or bone destruction. Soft tissues and spinal canal: No prevertebral soft tissue swelling. No paraspinal soft tissue mass or infiltration. Disc levels: Degenerative changes with disc space narrowing and endplate hypertrophic changes at C4-5, C5-6, and C6-7 levels. Upper chest: Lung apices are clear. Other: None. IMPRESSION: 1. No acute intracranial abnormalities. 2. Nonspecific reversal of the usual cervical lordosis. Mild degenerative changes in the cervical spine. No acute displaced fractures identified. Electronically Signed   By: Lucienne Capers M.D.   On: 01/12/2018 23:55   Scheduled Meds: . aspirin  EC  81 mg Oral Daily  . carvedilol  6.25 mg Oral BID WC  . cyclobenzaprine  5 mg Oral QHS  . [START ON 01/15/2018] ferrous sulfate  325 mg Oral Q breakfast  . furosemide  40 mg Oral Daily  . gabapentin  400 mg Oral TID  . heparin injection (subcutaneous)  5,000 Units Subcutaneous Q8H  . insulin aspart  0-15 Units Subcutaneous TID WC  . insulin aspart  4 Units Subcutaneous TID WC  . insulin glargine  65 Units Subcutaneous Q2200  . potassium chloride  10 mEq Oral Daily  . rosuvastatin  40 mg Oral Daily  . sacubitril-valsartan  1 tablet Oral BID  . sodium chloride flush  3 mL Intravenous Q12H  . spironolactone  25 mg Oral Daily   Continuous Infusions: . sodium chloride      LOS: 0 days   Kerney Elbe, DO [facility] Pager [phone]  If 7PM-7AM, please contact night-coverage [email or web address] Password [facility] 01/14/2018, 12:54 PM

## 2018-01-14 NOTE — Progress Notes (Signed)
  Echocardiogram 2D Echocardiogram has been performed.  Jeffrey [name] 01/14/2018, 12:04 PM

## 2018-01-14 NOTE — Progress Notes (Addendum)
Pt is stable, vitals stable (orthostatic vitals done this am and is negative), Echo done, pt is going to be NPO since midnight for EP evaluation tomorrow am for possible AICD placement. Pt refused to RN, RN paged MD and MD talked to patient over phone, family members in bed side and is updating, will continue to monitor the patient  Palma Holter, RN

## 2018-01-15 ENCOUNTER — Encounter (HOSPITAL_COMMUNITY)
Admission: EM | Disposition: A | Discharge status: Home / Self Care | Payer: Self-pay | Source: Home / Self Care | Attending: Internal Medicine

## 2018-01-15 DIAGNOSIS — I5022 Chronic systolic (congestive) heart failure: Secondary | ICD-10-CM

## 2018-01-15 DIAGNOSIS — I10 Essential (primary) hypertension: Secondary | ICD-10-CM

## 2018-01-15 DIAGNOSIS — I429 Cardiomyopathy, unspecified: Secondary | ICD-10-CM

## 2018-01-15 DIAGNOSIS — I428 Other cardiomyopathies: Secondary | ICD-10-CM

## 2018-01-15 DIAGNOSIS — R55 Syncope and collapse: Principal | ICD-10-CM

## 2018-01-15 HISTORY — PX: ICD IMPLANT: EP1208

## 2018-01-15 LAB — CBC WITH DIFFERENTIAL/PLATELET
Basophils Absolute: 0 10*3/uL (ref 0.0–0.1)
Basophils Relative: 1 %
EOS ABS: 0 10*3/uL (ref 0.0–0.7)
Eosinophils Relative: 1 %
HCT: 41.5 % (ref 39.0–52.0)
Hemoglobin: 12.8 g/dL — ABNORMAL LOW (ref 13.0–17.0)
LYMPHS ABS: 1.8 10*3/uL (ref 0.7–4.0)
LYMPHS PCT: 41 %
MCH: 24.3 pg — AB (ref 26.0–34.0)
MCHC: 30.8 g/dL (ref 30.0–36.0)
MCV: 78.9 fL (ref 78.0–100.0)
Monocytes Absolute: 0.2 10*3/uL (ref 0.1–1.0)
Monocytes Relative: 6 %
Neutro Abs: 2.3 10*3/uL (ref 1.7–7.7)
Neutrophils Relative %: 51 %
Platelets: 244 10*3/uL (ref 150–400)
RBC: 5.26 MIL/uL (ref 4.22–5.81)
RDW: 15.7 % — ABNORMAL HIGH (ref 11.5–15.5)
WBC: 4.3 10*3/uL (ref 4.0–10.5)

## 2018-01-15 LAB — COMPREHENSIVE METABOLIC PANEL
ALT: 15 U/L — AB (ref 17–63)
AST: 15 U/L (ref 15–41)
Albumin: 2.7 g/dL — ABNORMAL LOW (ref 3.5–5.0)
Alkaline Phosphatase: 68 U/L (ref 38–126)
Anion gap: 8 (ref 5–15)
BUN: 17 mg/dL (ref 6–20)
CHLORIDE: 107 mmol/L (ref 101–111)
CO2: 21 mmol/L — AB (ref 22–32)
CREATININE: 1.07 mg/dL (ref 0.61–1.24)
Calcium: 8.6 mg/dL — ABNORMAL LOW (ref 8.9–10.3)
GFR calc non Af Amer: >60 mL/min (ref >60)
Glucose, Bld: 310 mg/dL — ABNORMAL HIGH (ref 65–99)
Potassium: 3.9 mmol/L (ref 3.5–5.1)
SODIUM: 136 mmol/L (ref 135–145)
Total Bilirubin: 0.6 mg/dL (ref 0.3–1.2)
Total Protein: 6.1 g/dL — ABNORMAL LOW (ref 6.5–8.1)

## 2018-01-15 LAB — PHOSPHORUS: PHOSPHORUS: 3.5 mg/dL (ref 2.5–4.6)

## 2018-01-15 LAB — GLUCOSE, CAPILLARY
GLUCOSE-CAPILLARY: 134 mg/dL — AB (ref 65–99)
GLUCOSE-CAPILLARY: 68 mg/dL (ref 65–99)
Glucose-Capillary: 286 mg/dL — ABNORMAL HIGH (ref 65–99)
Glucose-Capillary: 73 mg/dL (ref 65–99)
Glucose-Capillary: 212 mg/dL — ABNORMAL HIGH (ref 65–99)

## 2018-01-15 LAB — SURGICAL PCR SCREEN
MRSA, PCR: NEGATIVE
Staphylococcus aureus: NEGATIVE

## 2018-01-15 LAB — MAGNESIUM: Magnesium: 1.8 mg/dL (ref 1.7–2.4)

## 2018-01-15 SURGERY — ICD IMPLANT

## 2018-01-15 MED ORDER — LIDOCAINE HCL 1 % IJ SOLN
INTRAMUSCULAR | Status: AC
  Filled 2018-01-15: qty 40

## 2018-01-15 MED ORDER — CHLORHEXIDINE GLUCONATE 4 % EX LIQD
60.0000 mL | Freq: Once | CUTANEOUS | Status: AC
  Administered 2018-01-15: 4 via TOPICAL

## 2018-01-15 MED ORDER — CEFAZOLIN SODIUM-DEXTROSE 1-4 GM/50ML-% IV SOLN
1.0000 g | Freq: Four times a day (QID) | INTRAVENOUS | Status: AC
  Administered 2018-01-15 – 2018-01-16 (×3): 1 g via INTRAVENOUS
  Filled 2018-01-15 (×3): qty 50

## 2018-01-15 MED ORDER — SODIUM CHLORIDE 0.9 % IV SOLN
INTRAVENOUS | Status: DC
  Administered 2018-01-15 (×2): via INTRAVENOUS

## 2018-01-15 MED ORDER — FENTANYL CITRATE (PF) 100 MCG/2ML IJ SOLN
INTRAMUSCULAR | Status: DC | PRN
  Administered 2018-01-15 (×2): 25 ug via INTRAVENOUS

## 2018-01-15 MED ORDER — ONDANSETRON HCL 4 MG/2ML IJ SOLN: 4.0000 mg | Freq: Four times a day (QID) | INTRAMUSCULAR | Status: DC | PRN

## 2018-01-15 MED ORDER — HEPARIN (PORCINE) IN NACL 2-0.9 UNIT/ML-% IJ SOLN
INTRAMUSCULAR | Status: AC | PRN
  Administered 2018-01-15: 500 mL

## 2018-01-15 MED ORDER — FENTANYL CITRATE (PF) 100 MCG/2ML IJ SOLN
INTRAMUSCULAR | Status: AC
  Filled 2018-01-15: qty 2

## 2018-01-15 MED ORDER — MIDAZOLAM HCL 5 MG/5ML IJ SOLN
INTRAMUSCULAR | Status: AC
  Filled 2018-01-15: qty 5

## 2018-01-15 MED ORDER — SODIUM CHLORIDE 0.9 % IV SOLN: INTRAVENOUS | Status: DC

## 2018-01-15 MED ORDER — CEFAZOLIN SODIUM-DEXTROSE 2-4 GM/100ML-% IV SOLN
INTRAVENOUS | Status: AC
  Filled 2018-01-15: qty 100

## 2018-01-15 MED ORDER — MIDAZOLAM HCL 5 MG/5ML IJ SOLN
INTRAMUSCULAR | Status: DC | PRN
  Administered 2018-01-15 (×2): 1 mg via INTRAVENOUS

## 2018-01-15 MED ORDER — HEPARIN (PORCINE) IN NACL 2-0.9 UNIT/ML-% IJ SOLN
INTRAMUSCULAR | Status: AC
  Filled 2018-01-15: qty 500

## 2018-01-15 MED ORDER — CHLORHEXIDINE GLUCONATE 4 % EX LIQD
60.0000 mL | Freq: Once | CUTANEOUS | Status: AC
  Filled 2018-01-15: qty 60

## 2018-01-15 MED ORDER — SODIUM CHLORIDE 0.9 % IR SOLN
80.0000 mg | Status: AC
  Administered 2018-01-15: 80 mg
  Filled 2018-01-15: qty 2

## 2018-01-15 MED ORDER — CEFAZOLIN SODIUM-DEXTROSE 2-4 GM/100ML-% IV SOLN
2.0000 g | INTRAVENOUS | Status: AC
  Administered 2018-01-15: 2 g via INTRAVENOUS
  Filled 2018-01-15: qty 100

## 2018-01-15 MED ORDER — SODIUM CHLORIDE 0.9 % IV SOLN: INTRAVENOUS | Status: AC

## 2018-01-15 MED ORDER — ACETAMINOPHEN 325 MG PO TABS: 325.0000 mg | ORAL_TABLET | ORAL | Status: DC | PRN

## 2018-01-15 MED ORDER — LIDOCAINE HCL (PF) 1 % IJ SOLN
INTRAMUSCULAR | Status: DC | PRN
Start: 2018-01-15 — End: 2018-01-15
  Administered 2018-01-15: 60 mL

## 2018-01-15 MED ORDER — SODIUM CHLORIDE 0.9 % IR SOLN
Status: AC
  Filled 2018-01-15: qty 2

## 2018-01-15 SURGICAL SUPPLY — 8 items
CABLE SURGICAL S-101-97-12 (CABLE) ×2 IMPLANT
HEMOSTAT SURGICEL 2X4 FIBR (HEMOSTASIS) ×2 IMPLANT
ICD VISIA MRI VR DVFB1D4 (ICD Generator) IMPLANT
LEAD SPRINT QUAT SEC 6935M-62 (Lead) ×2 IMPLANT
PAD DEFIB LIFELINK (PAD) ×2 IMPLANT
SHEATH CLASSIC 9F (SHEATH) ×2 IMPLANT
TRAY PACEMAKER INSERTION (PACKS) ×2 IMPLANT
VISIA MRI VR DVFB1D4 (ICD Generator) ×3 IMPLANT

## 2018-01-15 NOTE — Plan of Care (Signed)
  Pain Managment: General experience of comfort will improve 01/15/2018 0455 - Completed/Met by Evert Kohl, RN

## 2018-01-15 NOTE — Consult Note (Signed)
Cardiology Consultation:   Patient ID: NIL Jeffrey [name]; [id number]; March 22, 1966   Admit date: 01/12/2018 Date of Consult: 01/15/2018  Primary Care Provider: Charlott Rakes, MD Primary Cardiologist: Mertie Moores, MD  Primary Electrophysiologist:  new today to Dr. Caryl Comes   Patient Profile:   DREW LIPS is a 52 y.o. male with a hx of non-obstructive CAD, NICM, IDDM, HTN, HLD, prior toe amputations 2/2 osteomyelitis, chronic CHF, who is being seen today for the evaluation of ICD implant, syncope at the request of Dr. Oval Linsey.  History of Present Illness:   Mr. Mccarry was admitted after a syncopal event at home.  He is accompanied by his mother, daughter, girlfriend, sister and brother.  The family participates a great deal in our talk, the patient minimally.  His girlfriend states they were about to have dinner when he suddenly seemed to jerk his body and fainted.  She shook him, he seemed to come around quickly, was a bit disoriented initially, refused to let her call 911, but agreed to be driven in.  The patient denies any CP, palpitations with the event or of late.  LABS K+ 3.3 >> 3.9 BUN/Creat 20/1.11 >> 17/1.07 Mag 1.8 Trop I <0.03 x4 BNP 34.3 WBC 4.6 > 4.3 H/H 12/40 > 12/41 Plts 258 > 244  Home meds include coreg/entresto/lasix/K+ I called his pharmacy, they confirm the patient is getting his meds refilled (occasionally late refills)  The patient denies hx of syncope though his daughter states that 6 years ago he fainted, was seen/cared for at [facility] and told he may need a pacemaker, his "weak heart" history goes back to then as well apparently.  The patient lives with his girlfriend who states for the last 2 months (after being told by his PMD that he would be fired from her practice if he did not become complaint with medicines and follow up) he has been taking his medicines daily as prescribed, prior to that she reports he would skip 2-3 days a week probably.  The  patient offers very little in regards to the conversation.  HE does tell me that he tolerated the medicines, this was not the issue, just did not want to be taking them  He has modest DOE   Denies prior syncope;  And palpitations   Past Medical History:  Diagnosis Date  . CAD (coronary artery disease)    a. cath 01/31/17: 60% 1st RPLB, 60% dist RCA, 55% prox RCA, 10% pro LAD --> Rx TX.   Marland Kitchen Chronic systolic CHF (congestive heart failure) ([place]) 01/28/2017   1. Echo 01/29/17:  EF 20-25, normal wall motion, mild LAE // 2. EF 10-15 by [facility] 01/2017   . Diabetes mellitus   . Diabetic foot infection ([place]) 03/2016   RT FOOT  . HTN (hypertension)   . Hyperlipidemia   . NICM (nonischemic cardiomyopathy) ([place]) 02/15/2017   1. Mod non-obs CAD on LHC in 01/2017 - CAD does not explain cardiomyopathy    Past Surgical History:  Procedure Laterality Date  . AMPUTATION Right 04/01/2016   Procedure: Right Great Toe Amputation;  Surgeon: Newt Minion, MD;  Location: [place];  Service: Orthopedics;  Laterality: Right;  . AMPUTATION Right 06/19/2016   Procedure: AMPUTATION SECOND TOE;  Surgeon: Marybelle Killings, MD;  Location: [place];  Service: Orthopedics;  Laterality: Right;  . AMPUTATION TOE Right   . BACK SURGERY     for abscess  . RIGHT/LEFT HEART CATH AND CORONARY ANGIOGRAPHY N/A 01/31/2017  Procedure: Right/Left Heart Cath and Coronary Angiography;  Surgeon: Troy Sine, MD;  Location: [place] [facility];  Service: Cardiovascular;  Laterality: N/A;       Inpatient Medications: Scheduled Meds: . aspirin EC  81 mg Oral Daily  . carvedilol  6.25 mg Oral BID WC  . cyclobenzaprine  5 mg Oral QHS  . ferrous sulfate  325 mg Oral Q breakfast  . furosemide  40 mg Oral Daily  . gabapentin  400 mg Oral TID  . heparin injection (subcutaneous)  5,000 Units Subcutaneous Q8H  . insulin aspart  0-15 Units Subcutaneous TID WC  . insulin aspart  4 Units Subcutaneous TID WC  . insulin glargine  65 Units Subcutaneous  Q2200  . potassium chloride  10 mEq Oral Daily  . rosuvastatin  40 mg Oral Daily  . sacubitril-valsartan  1 tablet Oral BID  . sodium chloride flush  3 mL Intravenous Q12H  . spironolactone  25 mg Oral Daily   Continuous Infusions: . sodium chloride    . sodium chloride     PRN Meds: sodium chloride, acetaminophen, albuterol, ondansetron **OR** ondansetron (ZOFRAN) IV, sodium chloride flush, zolpidem  Allergies:   No Known Allergies  Social History:   Social History   Socioeconomic History  . Marital status: Significant Other    Spouse name: Not on file  . Number of children: Not on file  . Years of education: Not on file  . Highest education level: Not on file  Social Needs  . Financial resource strain: Not on file  . Food insecurity - worry: Not on file  . Food insecurity - inability: Not on file  . Transportation needs - medical: Not on file  . Transportation needs - non-medical: Not on file  Occupational History  . Not on file  Tobacco Use  . Smoking status: Never Smoker  . Smokeless tobacco: Never Used  Substance and Sexual Activity  . Alcohol use: No  . Drug use: No  . Sexual activity: Not on file  Other Topics Concern  . Not on file  Social History Narrative  . Not on file    Family History:   Family History  Problem Relation Age of Onset  . Diabetes Mother   . Hypertension Mother   . Diabetes Father   . Heart attack Father   . Diabetes Sister   . Heart attack Maternal Grandmother      ROS:  Please see the history of present illness.  All other ROS reviewed and negative.     Physical Exam/Data:   Vitals:   01/14/18 0838 01/14/18 1309 01/14/18 2034 01/15/18 0454  BP: 127/82 131/87 (!) 154/92 (!) 153/95  Pulse: 82 80 89 84  Resp:  20 20 20   Temp:  98.4 F (36.9 C) 98.9 F (37.2 C) 98 F (36.7 C)  TempSrc:  Oral Oral Oral  SpO2:  97% 99% 95%  Weight:    197 lb 8 oz (89.6 kg)  Height:        Intake/Output Summary (Last 24 hours) at  01/15/2018 0957 Last data filed at 01/15/2018 0846 Gross per 24 hour  Intake 720 ml  Output 1725 ml  Net -1005 ml   Filed Weights   01/13/18 1425 01/14/18 0547 01/15/18 0454  Weight: 198 lb (89.8 kg) 197 lb 6.4 oz (89.5 kg) 197 lb 8 oz (89.6 kg)   Body mass index is 30.93 kg/m.  General:  Well nourished, well developed, in no acute  distress HEENT: normal Lymph: no adenopathy Neck: no JVD Endocrine:  No thryomegaly Vascular: No carotid bruits Cardiac: RRR; no murmurs gallops or rubs are appreciated Lungs:  CTA b/l, no wheezing, rhonchi or rales  Abd: soft, nontender Ext: no edema Musculoskeletal:  No deformities Skin: warm and dry  Neuro:   no gross focal abnormalities noted Psych:  Somewhat flat, but pleasent   EKG:  The EKG was personally reviewed and demonstrates:   SR 89bpm, PR 144ms, QRS 38ms, QTc 478ms Telemetry:  Telemetry was personally reviewed and demonstrates:  SR 80's-90 range  Relevant CV Studies:  01/14/18: TTE Study Conclusions - Left ventricle: The cavity size was normal. Wall thickness was   increased in a pattern of mild LVH. Systolic function was   severely reduced. The estimated ejection fraction was 20%.   Diffuse hypokinesis. Features are consistent with a pseudonormal   left ventricular filling pattern, with concomitant abnormal   relaxation and increased filling pressure (grade 2 diastolic   dysfunction). Doppler parameters are consistent with high   ventricular filling pressure. - Left atrium: The atrium was moderately dilated. - Right ventricle: The cavity size was mildly dilated. Systolic   function was moderately to severely reduced.  01/29/17: TTE w/LVEF 20-25%  01/31/17: LHC  1st RPLB lesion, 60 %stenosed.  Dist RCA lesion, 60 %stenosed.  Prox RCA lesion, 55 %stenosed.  Prox LAD lesion, 10 %stenosed.  There is severe left ventricular systolic dysfunction.  LV end diastolic pressure is mildly elevated. Severe global LV dysfunction  with an ejection fraction of 10-15%.  The pattern is one of a nonischemic cardiomyopathy. Mild coronary obstructive disease with smooth 10% narrowing in the LAD; normal ramus intermediate, normal left circumflex; and RCA with 50-60% proximal stenosis and distal tapering of 60%.  Prior to giving rise to 3 small distal branches with 60% narrowing in a small inferior LV branch.  The patient's LV dysfunction is out of proportion to his CAD. RECOMMENDATION: With the patient's severe LV dysfunction, it is recommended that the patient be transferred to 4 N stepdown unit rather than return back to [facility].  Initiation of medical therapy with carvedilol, spironolactone, and probable initiation of angiotension receptorblocker/neprilysin inhibition therapy.  Consider short-term life-vest prior to discharge to allow for possible medication induced improvement in LV function.  07/31/14: TTE [facility]) LVEF 45-50% 10/01/11: [facility], TTE LVEF 55-60%  Laboratory Data:  Chemistry Recent Labs  Lab 01/13/18 0857 01/14/18 0808 01/15/18 0711  NA 140 136 136  K 3.6 3.8 3.9  CL 107 105 107  CO2 25 22 21*  GLUCOSE 124* 241* 310*  BUN 16 15 17   CREATININE 0.89 0.91 1.07  CALCIUM 8.8* 8.8* 8.6*  GFRNONAA >60 >60 >60  GFRAA >60 >60 >60  ANIONGAP 8 9 8     Recent Labs  Lab 01/14/18 0808 01/15/18 0711  PROT 6.1* 6.1*  ALBUMIN 2.7* 2.7*  AST 18 15  ALT 18 15*  ALKPHOS 69 68  BILITOT 0.6 0.6   Hematology Recent Labs  Lab 01/13/18 1511 01/14/18 0808 01/15/18 0711  WBC 4.2 3.5* 4.3  RBC 5.21 5.32 5.26  HGB 12.7* 13.2 12.8*  HCT 40.7 41.4 41.5  MCV 78.1 77.8* 78.9  MCH 24.4* 24.8* 24.3*  MCHC 31.2 31.9 30.8  RDW 15.8* 15.7* 15.7*  PLT 243 231 244   Cardiac Enzymes Recent Labs  Lab 01/12/18 2325 01/13/18 0221 01/13/18 0857 01/13/18 1511  TROPONINI <0.03 <0.03 <0.03 <0.03   No results for input(s): TROPIPOC  in the last 168 hours.  BNP Recent Labs  Lab 01/12/18 2324  BNP 34.3      DDimer No results for input(s): DDIMER in the last 168 hours.  Radiology/Studies:   Dg Chest 2 View Result Date: 01/12/2018 CLINICAL DATA:  Initial evaluation for acute syncope. EXAM: CHEST  2 VIEW COMPARISON:  Prior radiograph from 10/20/17. FINDINGS: Mild cardiomegaly, stable.  Mediastinal silhouette. The lungs are normally inflated. No airspace consolidation. Superimposed mild left basilar atelectasis. Mild diffuse vascular congestion without frank pulmonary edema. No pleural effusion. No pneumothorax. No acute osseous abnormality identified. IMPRESSION: 1. Stable cardiomegaly with mild diffuse pulmonary vascular congestion without frank pulmonary edema. 2. Superimposed mild left basilar atelectasis. Electronically Signed   By: Jeannine Boga M.D.   On: 01/12/2018 23:56   Ct Head Wo Contrast Result Date: 01/12/2018 CLINICAL DATA:  Patient passed out and fell forward onto a wooden floor yesterday. Headache and neck pain. EXAM: CT HEAD WITHOUT CONTRAST CT CERVICAL SPINE WITHOUT CONTRAST TECHNIQUE: Multidetector CT imaging of the head and cervical spine was performed following the standard protocol without intravenous contrast. Multiplanar CT image reconstructions of the cervical spine were also generated. COMPARISON:  None. FINDINGS: CT HEAD FINDINGS Brain: No evidence of acute infarction, hemorrhage, hydrocephalus, extra-axial collection or mass lesion/mass effect. Vascular: No hyperdense vessel or unexpected calcification. Skull: Normal. Negative for fracture or focal lesion. Sinuses/Orbits: No acute finding. Other: None. CT CERVICAL SPINE FINDINGS Alignment: Reversal of the usual cervical lordosis without anterior subluxation. This may be due to patient positioning but ligamentous injury or muscle spasm could also have this appearance and are not excluded. Normal alignment of the facet joints. C1-2 articulation appears intact. Skull base and vertebrae: Skull base appears intact. No vertebral  compression deformities. No focal bone lesion or bone destruction. Soft tissues and spinal canal: No prevertebral soft tissue swelling. No paraspinal soft tissue mass or infiltration. Disc levels: Degenerative changes with disc space narrowing and endplate hypertrophic changes at C4-5, C5-6, and C6-7 levels. Upper chest: Lung apices are clear. Other: None. IMPRESSION: 1. No acute intracranial abnormalities. 2. Nonspecific reversal of the usual cervical lordosis. Mild degenerative changes in the cervical spine. No acute displaced fractures identified. Electronically Signed   By: Lucienne Capers M.D.   On: 01/12/2018 23:55      Assessment and Plan:   1. Syncope     Patient offers very little in details, no apparent warning, brief in duration 2. NICM     He has been rx good medical regime, on/off about meds until 2 mo ago with reported compliance  I discussed with the patient (and family) ICD implant procedure, risks, benefits.  He has not been completely compliant with his meds until 2 mo ago, given syncope, either life vest until shown compliance or, not unreasonable perhaps and ICD implant was discussed.   The patient is not completely on board with the idea of ICD implant.  He is willing to discuss further with Dr. Caryl Comes however.  Dr. Caryl Comes will se later this morning  3. Chronic CHF     Exam does not suggest fluid OL at this time   For questions or updates, please contact [place] Please consult [email or web address] for contact info under Cardiology/STEMI.   Signed, Baldwin Jamaica, PA-C  01/15/2018 9:57 AM;t   As above Hx and PE modified to include my findings Syncope NICM CHF chronic systolic HTN  DM Sleep disordered breathing Poor understanding  Have reviewed with pt and family the  concern and risks of sudden death and CHF and the implications of syncope   We have stressed the importance of medication compliance   His medication regime as prescribed is good  Have  reviewed the potential benefits and risks of ICD implantation including but not limited to death, perforation of heart or lung, lead dislodgement, infection,  device malfunction and inappropriate shocks.  The patient and familyexpress understanding  and are willing to proceed.    Will need outpt sleep study

## 2018-01-15 NOTE — Progress Notes (Addendum)
Inpatient Diabetes Program Recommendations  AACE/ADA: New Consensus Statement on Inpatient Glycemic Control (2015)  Target Ranges:  Prepandial:   less than 140 mg/dL      Peak postprandial:   less than 180 mg/dL (1-2 hours)      Critically ill patients:  140 - 180 mg/dL   Lab Results  Component Value Date   GLUCAP 286 (H) 01/15/2018   HGBA1C 13.8 12/20/2017    Review of Glycemic Control  Diabetes history: DM2 Outpatient Diabetes medications: Lantus 65 units QHS, Novolog 0-15 units tidwc Current orders for Inpatient glycemic control: Lantus 65 units QHS, Novolog 0-15 units tidwc + 4 units tidwc  NPO at present HgbA1C of 13.8% indicates sub-par glycemic control at home  Inpatient Diabetes Program Recommendations:     Change Novolog 0-15 units to Q4H while NPO. Will speak to pt/family regarding HgbA1C and importance of controlling blood sugars to prevent complications.  Thank you. Lorenda Peck, RD, LDN, CDE Inpatient Diabetes Coordinator [phone]

## 2018-01-15 NOTE — Progress Notes (Signed)
ICD Criteria  Current LVEF:20%. Within 12 months prior to implant: Yes   Heart failure history: Yes, Class II  Cardiomyopathy history: Yes, Non-Ischemic Cardiomyopathy.  Atrial Fibrillation/Atrial Flutter: No.  Ventricular tachycardia history: Yes, Hemodynamic instability present. VT Type is unknown.  Cardiac arrest history: No.  History of syndromes with risk of sudden death: No.  Previous ICD: No.  Current ICD indication: Secondary  PPM indication: No.   Class I or II Bradycardia indication present: No  Beta Blocker therapy for 3 or more months: Yes, prescribed.   Ace Inhibitor/ARB therapy for 3 or more months: Yes, prescribed.

## 2018-01-15 NOTE — Plan of Care (Signed)
Pt. Post ICD placement. L arm in sling, restrictions. Pt. Knows to call for assistance with ambulation and bed movement.

## 2018-01-15 NOTE — Progress Notes (Signed)
PROGRESS NOTE    DRE GAMINO  AJO:[id number] DOB: 1966/01/30 DOA: 01/12/2018 PCP: Charlott Rakes, MD  Brief Narrative:  Jeffrey [name]  is a 52 y.o. male, with past medical history significant for chronic systolic and diastolic congestive heart failure, coronary artery disease presenting with a syncopal episode that happened yesterday at night. Patient lives at home with a friend and he passed out from a standing position. He denies any preceding chest pains or palpitations. Patient did not feel like coming to the hospital yesterday. No history of seizures. Patient has a history of congestive heart failure with ejection fraction 20-25% and he is supposed to follow-up with an echo to be evaluated for defibrillator placemen but failed to follow-up. Given concern for Cardiac Etiology, Cardiology was consulted and recommended repeating ECHO. Case was discussed with Dr. Oval Linsey and because EF remains low will have EP evaluate for AICD placement. Dr. Caryl Comes of EP saw patient today and feels ICD is indicated and Patient willing to proceed and so will be done today.   Assessment & Plan:   Active Problems:   Essential hypertension   Diabetic neuropathy ([place])   Uncontrolled type 2 diabetes mellitus with hyperglycemia, with long-term current use of insulin (HCC)   Anemia, iron deficiency   Chronic systolic heart failure (HCC)   NICM (nonischemic cardiomyopathy) ([place])   Coronary artery disease involving native coronary artery of native heart without angina pectoris   Hyperlipidemia   Syncope  Syncope  -CT Head and Cervical Spine showed No acute intracranial abnormalities.  Nonspecific reversal of the usual cervical lordosis. Mild degenerative changes in the cervical spine. No acute displaced fractures identified. -ECHOCardiogram done and showed The estimated ejection fraction was 20%. Diffuse hypokinesis. Features are consistent with a pseudonormal left ventricular filling pattern, with  concomitant abnormal relaxation and increased filling pressure (grade 2 diastolic dysfunction). Doppler parameters are consistent with high ventricular filling pressure. -Troponin I x 4 were <0.03 -Orthostatic Vital Signs done and Negative -Neurochecks -Etiology highly concerning for Cardiac Arrythmia -Cardiology consulted for Evaluation and appreciate Recc's;  -Given ECHOCardiogram findings and history will need AICD and EP Cardiology evaluated and will proceed with placement today -NPO this AM for anticipated procedure of ICD placement   Chronic Combined Systolic and Grade 2 Diastolic Congestive Heart failure with an Ejection Fraction of 20-25% -BNP was 34.3 -Heart Healthy/Carb Modified Diet -Currently not Decompensated  -Strict I's/O's, Daily Weights, SLIV, and Fluid Restrict 1500 mL -C/w Carvedilol 6.25 mg po BID, Sacubitril-Valsartan 1 tab po BID, Furosemide 40 mg po Daily -ECHOCardiogram done and showed The estimated ejection fraction was 20%. Diffuse hypokinesis. Features are consistent with a pseudonormal left ventricular filling pattern, with concomitant abnormal relaxation and increased filling pressure (grade 2 diastolic dysfunction). Doppler parameters are consistent with high ventricular filling pressure. -Cardiology Consulted for further evaluation -Cardiology adding Spironolactone 25 mg po Daily and are consulting EP for evaluation for AICD -EP evaluated and will place ICD today  -Patient is - 546.3 mL and Weight remains 197 -Appears Euvolemic  -Continue to Monitor Volume Status Closely   Hx of CAD/NICM -No Chest Pain -Troponin I x 4 were <0.03 -C/w ASA 81 mg po Daily, Sacubitril-Valsartan 24-36 mg po BID, Carvedilol 6.25 mg po BID, and Rosuvstatin 40 mg po Daily  -Cardiology following and appreciate Further Recc's  Uncontrolled Diabetes Mellitus Type 2 complicated by Neuropathy -Checked HbA1c on 12/20/17 and was 13.8 -Restarted Lantus 65 units sq Daily -C/w Moderate  Novolog SSI AC and Novolog 4 untis  TIDwm; Will change to Moderate SSI q4h if patient remains prolonged NPO -CBG's ranging from 134-286 -C/w Gabapentin 400 mg po TID   HLD -C/w Sacubitril-Valsartan 24-36 mg po BID, Carvedilol 6.25 mg po BID, Spironolactone 25 mg po Daily, and Furosemide 40 mg po Daily   HLD -C/w Rosuvastatin 40 mg po Daily   Hx of Iron Deficiency Anemia -[name] Hb/Hct went from 12.7/40.7 -> 13.2/41.4 -> 12.8/41.5 -Continue to Monitor for S/Sx of Bleeding -C/w Ferrous Sulfate 325 mg po Daily  -Repeat CBC in AM   Leukopenia -Likely Reactive; WBC went from 4.5 -> 4.2 -> 3.5 -> 4.3 -Continue to Monitor and Repeat CBC in AM  DVT prophylaxis: Heparin 5,000 units sq q8h Code Status: FULL CODE Family Communication: Discussed with entire Family at bedside Disposition Plan: Remain Inpatient as patient will be getting ICD placed today   Consultants:   Cardiology Dr. Jonelle Sidle [place]/Dr. Lyman Bishop   EP Dr. Virl Axe    Procedures:  ECHOCARDIOGRAM ------------------------------------------------------------------- Study Conclusions  - Left ventricle: The cavity size was normal. Wall thickness was   increased in a pattern of mild LVH. Systolic function was   severely reduced. The estimated ejection fraction was 20%.   Diffuse hypokinesis. Features are consistent with a pseudonormal   left ventricular filling pattern, with concomitant abnormal   relaxation and increased filling pressure (grade 2 diastolic   dysfunction). Doppler parameters are consistent with high   ventricular filling pressure. - Left atrium: The atrium was moderately dilated. - Right ventricle: The cavity size was mildly dilated. Systolic   function was moderately to severely reduced.   Antimicrobials: Antibiotics Given (last 72 hours)    Date/Time Action Medication Dose Rate   01/15/18 1617 New Bag/Given   ceFAZolin (ANCEF) IVPB 2g/100 mL premix 2 g 200 mL/hr      Subjective: Seen and examined at bedside and had no complaints. States he slept ok. No CP, SOB, Nausea, Vomiting. Waiting to talk to EP. No other complaints.   Objective: Vitals:   01/14/18 1309 01/14/18 2034 01/15/18 0454 01/15/18 1613  BP: 131/87 (!) 154/92 (!) 153/95   Pulse: 80 89 84   Resp: 20 20 20    Temp: 98.4 F (36.9 C) 98.9 F (37.2 C) 98 F (36.7 C)   TempSrc: Oral Oral Oral   SpO2: 97% 99% 95% 98%  Weight:   89.6 kg (197 lb 8 oz)   Height:        Intake/Output Summary (Last 24 hours) at 01/15/2018 1619 Last data filed at 01/15/2018 1510 Gross per 24 hour  Intake 721.67 ml  Output 1300 ml  Net -578.33 ml   Filed Weights   01/13/18 1425 01/14/18 0547 01/15/18 0454  Weight: 89.8 kg (198 lb) 89.5 kg (197 lb 6.4 oz) 89.6 kg (197 lb 8 oz)   Examination: Physical Exam:  Constitutional: WN/WD AAM in NAD appears calm and comfortable laying in bed surrounded by family  Eyes: Sclerae anicteric. Lids normal ENMT: External Ears and nose appear normal Neck: Supple with no JVD Respiratory: CTAB; No wheezing/rales/rhonchi Cardiovascular: RRR; No LE edema Abdomen: Soft, NT, ND. Bowel Sounds present GU: Deferred Musculoskeletal: Right Toe Amputations. Good ROM. No contractures Skin: Warm and dry; Had some old skin lesions/discolorations on abodmen. No appreciable rashes Neurologic: CN 2-12 grossly intact with no focal deficits Psychiatric: Normal mood and affect. Intact judgement and insight  Data Reviewed: I have personally reviewed following labs and imaging studies  CBC: Recent Labs  Lab 01/12/18 2020 01/13/18  0221 01/13/18 1511 01/14/18 0808 01/15/18 0711  WBC 4.6 4.5 4.2 3.5* 4.3  NEUTROABS  --   --   --  1.7 2.3  HGB 12.5* 12.8* 12.7* 13.2 12.8*  HCT 40.3 41.2 40.7 41.4 41.5  MCV 77.9* 78.2 78.1 77.8* 78.9  PLT 258 248 243 231 474   Basic Metabolic Panel: Recent Labs  Lab 01/12/18 2020 01/12/18 2325 01/13/18 0221 01/13/18 0857 01/14/18 0808  01/15/18 0711  NA 139  --   --  140 136 136  K 3.3*  --   --  3.6 3.8 3.9  CL 103  --   --  107 105 107  CO2 26  --   --  25 22 21*  GLUCOSE 190*  --   --  124* 241* 310*  BUN 20  --   --  16 15 17   CREATININE 1.11  --  0.93 0.89 0.91 1.07  CALCIUM 9.0  --   --  8.8* 8.8* 8.6*  MG  --  1.8  --   --  1.7 1.8  PHOS  --   --   --   --  3.0 3.5   GFR: Estimated Creatinine Clearance: 86.2 mL/min (by C-G formula based on SCr of 1.07 mg/dL). Liver Function Tests: Recent Labs  Lab 01/14/18 0808 01/15/18 0711  AST 18 15  ALT 18 15*  ALKPHOS 69 68  BILITOT 0.6 0.6  PROT 6.1* 6.1*  ALBUMIN 2.7* 2.7*   No results for input(s): LIPASE, AMYLASE in the last 168 hours. No results for input(s): AMMONIA in the last 168 hours. Coagulation Profile: No results for input(s): INR, PROTIME in the last 168 hours. Cardiac Enzymes: Recent Labs  Lab 01/12/18 2325 01/13/18 0221 01/13/18 0857 01/13/18 1511  TROPONINI <0.03 <0.03 <0.03 <0.03   BNP (last 3 results) No results for input(s): PROBNP in the last 8760 hours. HbA1C: No results for input(s): HGBA1C in the last 72 hours. CBG: Recent Labs  Lab 01/14/18 1128 01/14/18 1628 01/14/18 2201 01/15/18 0738 01/15/18 1117  GLUCAP 148* 284* 259* 286* 134*   Lipid Profile: No results for input(s): CHOL, HDL, LDLCALC, TRIG, CHOLHDL, LDLDIRECT in the last 72 hours. Thyroid Function Tests: No results for input(s): TSH, T4TOTAL, FREET4, T3FREE, THYROIDAB in the last 72 hours. Anemia Panel: No results for input(s): VITAMINB12, FOLATE, FERRITIN, TIBC, IRON, RETICCTPCT in the last 72 hours. Sepsis Labs: No results for input(s): PROCALCITON, LATICACIDVEN in the last 168 hours.  No results found for this or any previous visit (from the past 240 hour(s)).   Radiology Studies: No results found. Scheduled Meds: . [MAR Hold] aspirin EC  81 mg Oral Daily  . [MAR Hold] carvedilol  6.25 mg Oral BID WC  . [MAR Hold] cyclobenzaprine  5 mg Oral QHS   . [MAR Hold] ferrous sulfate  325 mg Oral Q breakfast  . [MAR Hold] furosemide  40 mg Oral Daily  . [MAR Hold] gabapentin  400 mg Oral TID  . gentamicin irrigation  80 mg Irrigation On Call  . [MAR Hold] heparin injection (subcutaneous)  5,000 Units Subcutaneous Q8H  . [MAR Hold] insulin aspart  0-15 Units Subcutaneous TID WC  . [MAR Hold] insulin aspart  4 Units Subcutaneous TID WC  . [MAR Hold] insulin glargine  65 Units Subcutaneous Q2200  . [MAR Hold] potassium chloride  10 mEq Oral Daily  . [MAR Hold] rosuvastatin  40 mg Oral Daily  . [MAR Hold] sacubitril-valsartan  1 tablet Oral BID  . [  MAR Hold] sodium chloride flush  3 mL Intravenous Q12H  . [MAR Hold] spironolactone  25 mg Oral Daily   Continuous Infusions: . [MAR Hold] sodium chloride    . sodium chloride 50 mL/hr at 01/15/18 1020  . sodium chloride    . sodium chloride    .  ceFAZolin (ANCEF) IV 2 g (01/15/18 1617)    LOS: 1 day   Kerney Elbe, DO [facility] Pager [phone]  If 7PM-7AM, please contact night-coverage [email or web address] Password [email or web address] 01/15/2018, 4:19 PM

## 2018-01-15 NOTE — Progress Notes (Signed)
DAILY PROGRESS NOTE   Patient Name: Jeffrey [name] Date of Encounter: 01/15/2018  Chief Complaint   No complaints  Patient Profile   Jeffrey [name] is a 52 y.o. male with a hx of chronic systolic and diastolic heart failure, hypertensive heart disease, hyperlipidemia, moderate, non-objective coronary artery disease, and diabetes who is being seen today for the evaluation of syncope at the request of Dr Merton Border.  Subjective   Echo yesterday shows LVEF 20%, grade 2 DD and high LV filling pressure, moderate LAE. Given his low EF, it is likely his syncope was arrhythmogenic. D/w family - they report he has been compliant with his meds the past 2 months, but not as much prior to that. Diuresed close to 1L negative overnight.   Objective   Vitals:   01/14/18 0838 01/14/18 1309 01/14/18 2034 01/15/18 0454  BP: 127/82 131/87 (!) 154/92 (!) 153/95  Pulse: 82 80 89 84  Resp:  _0 Temp:  98.4 F (36.9 C) 98.9 F (37.2 C) 98 F (36.7 C)  TempSrc:  Oral Oral Oral  SpO2:  97% 99% 95%  Weight:    197 lb 8 oz (89.6 kg)  Height:        Intake/Output Summary (Last 24 hours) at 01/15/2018 1007 Last data filed at 01/15/2018 0846 Gross per 24 hour  Intake 720 ml  Output 1725 ml  Net -1005 ml   Filed Weights   01/13/18 1425 01/14/18 0547 01/15/18 0454  Weight: 198 lb (89.8 kg) 197 lb 6.4 oz (89.5 kg) 197 lb 8 oz (89.6 kg)    Physical Exam   General appearance: alert and no distress Neck: no carotid bruit, no JVD and thyroid not enlarged, symmetric, no tenderness/mass/nodules Lungs: clear to auscultation bilaterally Heart: regular rate and rhythm Abdomen: soft, non-tender; bowel sounds normal; no masses,  no organomegaly Extremities: extremities normal, atraumatic, no cyanosis or edema Pulses: 2+ and symmetric Skin: Skin color, texture, turgor normal. No rashes or lesions Neurologic: Grossly normal Psych: Pleasant  Inpatient Medications    Scheduled Meds: . aspirin  EC  81 mg Oral Daily  . carvedilol  6.25 mg Oral BID WC  . cyclobenzaprine  5 mg Oral QHS  . ferrous sulfate  325 mg Oral Q breakfast  . furosemide  40 mg Oral Daily  . gabapentin  400 mg Oral TID  . heparin injection (subcutaneous)  5,000 Units Subcutaneous Q8H  . insulin aspart  0-15 Units Subcutaneous TID WC  . insulin aspart  4 Units Subcutaneous TID WC  . insulin glargine  65 Units Subcutaneous Q2200  . potassium chloride  10 mEq Oral Daily  . rosuvastatin  40 mg Oral Daily  . sacubitril-valsartan  1 tablet Oral BID  . sodium chloride flush  3 mL Intravenous Q12H  . spironolactone  25 mg Oral Daily    Continuous Infusions: . sodium chloride    . sodium chloride      PRN Meds: sodium chloride, acetaminophen, albuterol, ondansetron **OR** ondansetron (ZOFRAN) IV, sodium chloride flush, zolpidem   Labs   Results for orders placed or performed during the hospital encounter of 01/12/18 (from the past 48 hour(s))  Troponin I     Status: None   Collection Time: 01/13/18  3:11 PM  Result Value Ref Range   Troponin I <0.03 <0.03 ng/mL    Comment: Performed at [facility], [place] [address] [place], [place] [address]  CBC     Status:  Abnormal   Collection Time: 01/13/18  3:11 PM  Result Value Ref Range   WBC 4.2 4.0 - 10.5 K/uL   RBC 5.21 4.22 - 5.81 MIL/uL   Hemoglobin 12.7 (L) 13.0 - 17.0 g/dL   HCT 40.7 39.0 - 52.0 %   MCV 78.1 78.0 - 100.0 fL   MCH 24.4 (L) 26.0 - 34.0 pg   MCHC 31.2 30.0 - 36.0 g/dL   RDW 15.8 (H) 11.5 - 15.5 %   Platelets 243 150 - 400 K/uL    Comment: Performed at [facility] [address] [place], [place] [address]  Glucose, capillary     Status: Abnormal   Collection Time: 01/13/18  4:14 PM  Result Value Ref Range   Glucose-Capillary 254 (H) 65 - 99 mg/dL   Comment 1 Notify RN   Glucose, capillary     Status: Abnormal   Collection Time: 01/13/18  9:22 PM  Result Value Ref Range   Glucose-Capillary 246 (H) 65 - 99 mg/dL    Glucose, capillary     Status: Abnormal   Collection Time: 01/14/18 12:01 AM  Result Value Ref Range   Glucose-Capillary 289 (H) 65 - 99 mg/dL  Glucose, capillary     Status: Abnormal   Collection Time: 01/14/18  7:49 AM  Result Value Ref Range   Glucose-Capillary 217 (H) 65 - 99 mg/dL   Comment 1 Notify RN   CBC with Differential/Platelet     Status: Abnormal   Collection Time: 01/14/18  8:08 AM  Result Value Ref Range   WBC 3.5 (L) 4.0 - 10.5 K/uL   RBC 5.32 4.22 - 5.81 MIL/uL   Hemoglobin 13.2 13.0 - 17.0 g/dL   HCT 41.4 39.0 - 52.0 %   MCV 77.8 (L) 78.0 - 100.0 fL   MCH 24.8 (L) 26.0 - 34.0 pg   MCHC 31.9 30.0 - 36.0 g/dL   RDW 15.7 (H) 11.5 - 15.5 %   Platelets 231 150 - 400 K/uL   Neutrophils Relative % 47 %   Neutro Abs 1.7 1.7 - 7.7 K/uL   Lymphocytes Relative 45 %   Lymphs Abs 1.6 0.7 - 4.0 K/uL   Monocytes Relative 7 %   Monocytes Absolute 0.2 0.1 - 1.0 K/uL   Eosinophils Relative 1 %   Eosinophils Absolute 0.0 0.0 - 0.7 K/uL   Basophils Relative 0 %   Basophils Absolute 0.0 0.0 - 0.1 K/uL    Comment: Performed at [facility], [address], [place], [place] [address]  Comprehensive metabolic panel     Status: Abnormal   Collection Time: 01/14/18  8:08 AM  Result Value Ref Range   Sodium 136 135 - 145 mmol/L   Potassium 3.8 3.5 - 5.1 mmol/L   Chloride 105 101 - 111 mmol/L   CO2 22 22 - 32 mmol/L   Glucose, Bld 241 (H) 65 - 99 mg/dL   BUN 15 6 - 20 mg/dL   Creatinine, Ser 0.91 0.61 - 1.24 mg/dL   Calcium 8.8 (L) 8.9 - 10.3 mg/dL   Total Protein 6.1 (L) 6.5 - 8.1 g/dL   Albumin 2.7 (L) 3.5 - 5.0 g/dL   AST 18 15 - 41 U/L   ALT 18 17 - 63 U/L   Alkaline Phosphatase 69 38 - 126 U/L   Total Bilirubin 0.6 0.3 - 1.2 mg/dL   GFR calc non Af Amer >60 >60 mL/min   GFR calc Af Amer >60 >60 mL/min  Comment: (NOTE) The eGFR has been calculated using the CKD EPI equation. This calculation has not been validated in all clinical situations. eGFR's  persistently <60 mL/min signify possible Chronic Kidney Disease.    Anion gap 9 5 - 15    Comment: Performed at [facility] [address] [place], [place] [address]  Magnesium     Status: None   Collection Time: 01/14/18  8:08 AM  Result Value Ref Range   Magnesium 1.7 1.7 - 2.4 mg/dL    Comment: Performed at [facility] [address], [place], [place] [address]  Phosphorus     Status: None   Collection Time: 01/14/18  8:08 AM  Result Value Ref Range   Phosphorus 3.0 2.5 - 4.6 mg/dL    Comment: Performed at [facility] [address] [place], [place] [address]  Glucose, capillary     Status: Abnormal   Collection Time: 01/14/18 11:28 AM  Result Value Ref Range   Glucose-Capillary 148 (H) 65 - 99 mg/dL   Comment 1 Notify RN   Glucose, capillary     Status: Abnormal   Collection Time: 01/14/18  4:28 PM  Result Value Ref Range   Glucose-Capillary 284 (H) 65 - 99 mg/dL  Glucose, capillary     Status: Abnormal   Collection Time: 01/14/18 10:01 PM  Result Value Ref Range   Glucose-Capillary 259 (H) 65 - 99 mg/dL  CBC with Differential/Platelet     Status: Abnormal   Collection Time: 01/15/18  7:11 AM  Result Value Ref Range   WBC 4.3 4.0 - 10.5 K/uL   RBC 5.26 4.22 - 5.81 MIL/uL   Hemoglobin 12.8 (L) 13.0 - 17.0 g/dL   HCT 41.5 39.0 - 52.0 %   MCV 78.9 78.0 - 100.0 fL   MCH 24.3 (L) 26.0 - 34.0 pg   MCHC 30.8 30.0 - 36.0 g/dL   RDW 15.7 (H) 11.5 - 15.5 %   Platelets 244 150 - 400 K/uL   Neutrophils Relative % 51 %   Neutro Abs 2.3 1.7 - 7.7 K/uL   Lymphocytes Relative 41 %   Lymphs Abs 1.8 0.7 - 4.0 K/uL   Monocytes Relative 6 %   Monocytes Absolute 0.2 0.1 - 1.0 K/uL   Eosinophils Relative 1 %   Eosinophils Absolute 0.0 0.0 - 0.7 K/uL   Basophils Relative 1 %   Basophils Absolute 0.0 0.0 - 0.1 K/uL    Comment: Performed at [facility], [address], [place], [place] [address]  Comprehensive metabolic panel     Status: Abnormal    Collection Time: 01/15/18  7:11 AM  Result Value Ref Range   Sodium 136 135 - 145 mmol/L   Potassium 3.9 3.5 - 5.1 mmol/L   Chloride 107 101 - 111 mmol/L   CO2 21 (L) 22 - 32 mmol/L   Glucose, Bld 310 (H) 65 - 99 mg/dL   BUN 17 6 - 20 mg/dL   Creatinine, Ser 1.07 0.61 - 1.24 mg/dL   Calcium 8.6 (L) 8.9 - 10.3 mg/dL   Total Protein 6.1 (L) 6.5 - 8.1 g/dL   Albumin 2.7 (L) 3.5 - 5.0 g/dL   AST 15 15 - 41 U/L   ALT 15 (L) 17 - 63 U/L   Alkaline Phosphatase 68 38 - 126 U/L   Total Bilirubin 0.6 0.3 - 1.2 mg/dL   GFR calc non Af Amer >60 >60 mL/min   GFR calc  Af Amer >60 >60 mL/min    Comment: (NOTE) The eGFR has been calculated using the CKD EPI equation. This calculation has not been validated in all clinical situations. eGFR's persistently <60 mL/min signify possible Chronic Kidney Disease.    Anion gap 8 5 - 15    Comment: Performed at [facility] [address] [place], [place] [address]  Magnesium     Status: None   Collection Time: 01/15/18  7:11 AM  Result Value Ref Range   Magnesium 1.8 1.7 - 2.4 mg/dL    Comment: Performed at [facility] [address], [place], [place] [address]  Phosphorus     Status: None   Collection Time: 01/15/18  7:11 AM  Result Value Ref Range   Phosphorus 3.5 2.5 - 4.6 mg/dL    Comment: Performed at [facility] [address] [place], [place] [address]  Glucose, capillary     Status: Abnormal   Collection Time: 01/15/18  7:38 AM  Result Value Ref Range   Glucose-Capillary 286 (H) 65 - 99 mg/dL   Comment 1 Notify RN     ECG   N/A  Telemetry   Sinus rhythm - Personally Reviewed  Radiology    No results found.  Cardiac Studies   LV EF: 20%  ------------------------------------------------------------------- History:   PMH:  NICM. CHF 428.  Coronary artery disease. Congestive heart failure.  Risk factors:  Hypertension. Diabetes mellitus.  Dyslipidemia.  ------------------------------------------------------------------- Study Conclusions  - Left ventricle: The cavity size was normal. Wall thickness was   increased in a pattern of mild LVH. Systolic function was   severely reduced. The estimated ejection fraction was 20%.   Diffuse hypokinesis. Features are consistent with a pseudonormal   left ventricular filling pattern, with concomitant abnormal   relaxation and increased filling pressure (grade 2 diastolic   dysfunction). Doppler parameters are consistent with high   ventricular filling pressure. - Left atrium: The atrium was moderately dilated. - Right ventricle: The cavity size was mildly dilated. Systolic   function was moderately to severely reduced.  Assessment   1. Active Problems: 2.   Essential hypertension 3.   Diabetic neuropathy ([place]) 4.   Uncontrolled type 2 diabetes mellitus with hyperglycemia, with long-term current use of insulin ([place]) 5.   Anemia, iron deficiency 6.   Chronic systolic heart failure ([place]) 7.   NICM (nonischemic cardiomyopathy) ([place]) 8.   Coronary artery disease involving native coronary artery of native heart without angina pectoris 9.   Hyperlipidemia 10.   Syncope 11.   Plan   1. Volume status is improving - appears near euvolemic. EP evaluating today for possible AICD given syncope which is presumed arrhythmogenic. Some moderate non-obstructive CAD at cath on 01/31/2017. Creatinine up with diuresis - monitor.   Time Spent Directly with Patient:  I have spent a total of 25 minutes with the patient reviewing hospital notes, telemetry, EKGs, labs and examining the patient as well as establishing an assessment and plan that was discussed personally with the patient. > 50% of time was spent in direct patient care.  Length of Stay:  LOS: 1 day   Pixie Casino, MD, [facility], [place] Director of the [facility] &   Card[facility] Diplomate of the American Board of Clinical Lipidology Attending Cardiologist  Direct Dial: [phone]  Fax: [phone]  Website:  [email or web address] 01/15/2018, 10:07 AM

## 2018-01-16 ENCOUNTER — Inpatient Hospital Stay (HOSPITAL_COMMUNITY): Payer: Medicaid Other

## 2018-01-16 ENCOUNTER — Encounter (HOSPITAL_COMMUNITY): Payer: Self-pay | Admitting: Internal Medicine

## 2018-01-16 ENCOUNTER — Telehealth: Payer: Self-pay | Admitting: Family Medicine

## 2018-01-16 ENCOUNTER — Other Ambulatory Visit: Payer: Self-pay

## 2018-01-16 DIAGNOSIS — E1142 Type 2 diabetes mellitus with diabetic polyneuropathy: Secondary | ICD-10-CM

## 2018-01-16 DIAGNOSIS — E1165 Type 2 diabetes mellitus with hyperglycemia: Secondary | ICD-10-CM

## 2018-01-16 DIAGNOSIS — E876 Hypokalemia: Secondary | ICD-10-CM

## 2018-01-16 DIAGNOSIS — Z959 Presence of cardiac and vascular implant and graft, unspecified: Secondary | ICD-10-CM

## 2018-01-16 DIAGNOSIS — D509 Iron deficiency anemia, unspecified: Secondary | ICD-10-CM

## 2018-01-16 DIAGNOSIS — I459 Conduction disorder, unspecified: Secondary | ICD-10-CM

## 2018-01-16 DIAGNOSIS — Z794 Long term (current) use of insulin: Secondary | ICD-10-CM

## 2018-01-16 DIAGNOSIS — E785 Hyperlipidemia, unspecified: Secondary | ICD-10-CM

## 2018-01-16 DIAGNOSIS — I2510 Atherosclerotic heart disease of native coronary artery without angina pectoris: Secondary | ICD-10-CM

## 2018-01-16 LAB — BASIC METABOLIC PANEL
Anion gap: 12 (ref 5–15)
BUN: 12 mg/dL (ref 6–20)
CHLORIDE: 105 mmol/L (ref 101–111)
CO2: 22 mmol/L (ref 22–32)
Calcium: 8.7 mg/dL — ABNORMAL LOW (ref 8.9–10.3)
Creatinine, Ser: 0.95 mg/dL (ref 0.61–1.24)
GFR calc non Af Amer: >60 mL/min (ref >60)
Glucose, Bld: 114 mg/dL — ABNORMAL HIGH (ref 65–99)
POTASSIUM: 3.4 mmol/L — AB (ref 3.5–5.1)
SODIUM: 139 mmol/L (ref 135–145)

## 2018-01-16 LAB — CBC WITH DIFFERENTIAL/PLATELET
BASOS PCT: 0 %
Basophils Absolute: 0 10*3/uL (ref 0.0–0.1)
EOS ABS: 0.1 10*3/uL (ref 0.0–0.7)
EOS PCT: 1 %
HCT: 42 % (ref 39.0–52.0)
HEMOGLOBIN: 13 g/dL (ref 13.0–17.0)
Lymphocytes Relative: 31 %
Lymphs Abs: 1.8 10*3/uL (ref 0.7–4.0)
MCH: 24.1 pg — AB (ref 26.0–34.0)
MCHC: 31 g/dL (ref 30.0–36.0)
MCV: 77.8 fL — ABNORMAL LOW (ref 78.0–100.0)
MONO ABS: 0.4 10*3/uL (ref 0.1–1.0)
MONOS PCT: 7 %
NEUTROS PCT: 61 %
Neutro Abs: 3.4 10*3/uL (ref 1.7–7.7)
PLATELETS: 243 10*3/uL (ref 150–400)
RBC: 5.4 MIL/uL (ref 4.22–5.81)
RDW: 15.4 % (ref 11.5–15.5)
WBC: 5.6 10*3/uL (ref 4.0–10.5)

## 2018-01-16 LAB — HIV ANTIBODY (ROUTINE TESTING W REFLEX): HIV SCREEN 4TH GENERATION: NONREACTIVE

## 2018-01-16 LAB — GLUCOSE, CAPILLARY
GLUCOSE-CAPILLARY: 131 mg/dL — AB (ref 65–99)
Glucose-Capillary: 72 mg/dL (ref 65–99)

## 2018-01-16 LAB — MAGNESIUM: MAGNESIUM: 1.7 mg/dL (ref 1.7–2.4)

## 2018-01-16 LAB — PHOSPHORUS: PHOSPHORUS: 3.9 mg/dL (ref 2.5–4.6)

## 2018-01-16 MED ORDER — SPIRONOLACTONE 25 MG PO TABS: 25.0000 mg | ORAL_TABLET | Freq: Every day | ORAL | 0 refills | Status: DC

## 2018-01-16 MED ORDER — POTASSIUM CHLORIDE CRYS ER 20 MEQ PO TBCR
40.0000 meq | EXTENDED_RELEASE_TABLET | Freq: Once | ORAL | Status: AC
  Administered 2018-01-16: 40 meq via ORAL
  Filled 2018-01-16: qty 2

## 2018-01-16 MED ORDER — ASPIRIN 81 MG PO TBEC: 81.0000 mg | DELAYED_RELEASE_TABLET | Freq: Every day | ORAL | 0 refills | Status: DC

## 2018-01-16 MED ORDER — POTASSIUM CHLORIDE CRYS ER 20 MEQ PO TBCR: 40.0000 meq | EXTENDED_RELEASE_TABLET | Freq: Two times a day (BID) | ORAL | Status: DC

## 2018-01-16 MED FILL — ?SPIR0N0LACTONE 25 MG TABLE: 25 | 30 days supply | Qty: 30 | Fill #0

## 2018-01-16 NOTE — Progress Notes (Signed)
Discharge instructions (including medications) discussed with and copy provided to patient/caregiver 

## 2018-01-16 NOTE — Progress Notes (Signed)
DAILY PROGRESS NOTE   Patient Name: Jeffrey [name] Date of Encounter: 01/16/2018  Chief Complaint   No CP or SOB. ICD site w/o significant pain.   Patient Profile   Jeffrey [name] is a 52 y.o. male with a hx of chronic systolic and diastolic heart failure, hypertensive heart disease, hyperlipidemia, moderate, non-objective coronary artery disease, and diabetes who is being seen for evaluation of syncope. History of similar episodes 6 yrs ago and was told he needed a pacer but did not follow-up. He had ICD placed 01/15/18.   Subjective   Feels well today. ECHO2/3 with LVEF 20%, grade 2 DD and high LV filling pressure, moderate LAE. Syncope felt to be arrhythmogenic given his low EF and had ICD placed yesterday. Minimal to no pain at site.   Objective   Vitals:   01/15/18 1830 01/15/18 1911 01/15/18 2305 01/16/18 0607  BP: (!) 130/98 126/86 (!) 154/96 (!) 130/91  Pulse: 85 86 93 84  Resp: '18 18  18  ' Temp:  98 F (36.7 C)  97.7 F (36.5 C)  TempSrc:  Oral  Oral  SpO2: 99% 96%  100%  Weight:    196 lb 3.2 oz (89 kg)  Height:        Intake/Output Summary (Last 24 hours) at 01/16/2018 0859 Last data filed at 01/16/2018 0529 Gross per 24 hour  Intake 631.67 ml  Output 700 ml  Net -68.33 ml   Filed Weights   01/14/18 0547 01/15/18 0454 01/16/18 0607  Weight: 197 lb 6.4 oz (89.5 kg) 197 lb 8 oz (89.6 kg) 196 lb 3.2 oz (89 kg)    Physical Exam   General: In no acute distress. Resting comfortably in bed. Quiet, girlfriend and ex-wife at bedside.  HENT: No JVD. Oropharynx clear, mucous membranes moist.  Cardiovascular: Regular rate and rhythm Pulmonary: CTA BL. Unlabored breathing.  Abdomen: Soft, non-tender and non-distended. +bowel sounds.  Extremities: No peripheral edema noted BL. Intact distal pulses. No gross deformities. Skin: Warm, dry. No cyanosis. Fresh ICD placement site left chest wall. No drainage. Nontender.  Psych: Mood normal and affect was mood congruent.  Responds to questions appropriately although family takes over conversation.    Inpatient Medications    Scheduled Meds: . aspirin EC  81 mg Oral Daily  . carvedilol  6.25 mg Oral BID WC  . cyclobenzaprine  5 mg Oral QHS  . ferrous sulfate  325 mg Oral Q breakfast  . furosemide  40 mg Oral Daily  . gabapentin  400 mg Oral TID  . insulin aspart  0-15 Units Subcutaneous TID WC  . insulin aspart  4 Units Subcutaneous TID WC  . insulin glargine  65 Units Subcutaneous Q2200  . potassium chloride  10 mEq Oral Daily  . potassium chloride  40 mEq Oral Once  . rosuvastatin  40 mg Oral Daily  . sacubitril-valsartan  1 tablet Oral BID  . sodium chloride flush  3 mL Intravenous Q12H  . spironolactone  25 mg Oral Daily   Continuous Infusions: . sodium chloride    . sodium chloride 50 mL/hr at 01/15/18 1857  .  ceFAZolin (ANCEF) IV Stopped (01/16/18 0529)   PRN Meds: sodium chloride, acetaminophen, acetaminophen, albuterol, ondansetron **OR** ondansetron (ZOFRAN) IV, sodium chloride flush, zolpidem   Labs   Results for orders placed or performed during the hospital encounter of 01/12/18 (from the past 48 hour(s))  Glucose, capillary     Status: Abnormal   Collection Time: 01/14/18  11:28 AM  Result Value Ref Range   Glucose-Capillary 148 (H) 65 - 99 mg/dL   Comment 1 Notify RN   Glucose, capillary     Status: Abnormal   Collection Time: 01/14/18  4:28 PM  Result Value Ref Range   Glucose-Capillary 284 (H) 65 - 99 mg/dL  Glucose, capillary     Status: Abnormal   Collection Time: 01/14/18 10:01 PM  Result Value Ref Range   Glucose-Capillary 259 (H) 65 - 99 mg/dL  CBC with Differential/Platelet     Status: Abnormal   Collection Time: 01/15/18  7:11 AM  Result Value Ref Range   WBC 4.3 4.0 - 10.5 K/uL   RBC 5.26 4.22 - 5.81 MIL/uL   Hemoglobin 12.8 (L) 13.0 - 17.0 g/dL   HCT 41.5 39.0 - 52.0 %   MCV 78.9 78.0 - 100.0 fL   MCH 24.3 (L) 26.0 - 34.0 pg   MCHC 30.8 30.0 - 36.0 g/dL    RDW 15.7 (H) 11.5 - 15.5 %   Platelets 244 150 - 400 K/uL   Neutrophils Relative % 51 %   Neutro Abs 2.3 1.7 - 7.7 K/uL   Lymphocytes Relative 41 %   Lymphs Abs 1.8 0.7 - 4.0 K/uL   Monocytes Relative 6 %   Monocytes Absolute 0.2 0.1 - 1.0 K/uL   Eosinophils Relative 1 %   Eosinophils Absolute 0.0 0.0 - 0.7 K/uL   Basophils Relative 1 %   Basophils Absolute 0.0 0.0 - 0.1 K/uL    Comment: Performed at [facility], [address], [place], [place] [address]  Comprehensive metabolic panel     Status: Abnormal   Collection Time: 01/15/18  7:11 AM  Result Value Ref Range   Sodium 136 135 - 145 mmol/L   Potassium 3.9 3.5 - 5.1 mmol/L   Chloride 107 101 - 111 mmol/L   CO2 21 (L) 22 - 32 mmol/L   Glucose, Bld 310 (H) 65 - 99 mg/dL   BUN 17 6 - 20 mg/dL   Creatinine, Ser 1.07 0.61 - 1.24 mg/dL   Calcium 8.6 (L) 8.9 - 10.3 mg/dL   Total Protein 6.1 (L) 6.5 - 8.1 g/dL   Albumin 2.7 (L) 3.5 - 5.0 g/dL   AST 15 15 - 41 U/L   ALT 15 (L) 17 - 63 U/L   Alkaline Phosphatase 68 38 - 126 U/L   Total Bilirubin 0.6 0.3 - 1.2 mg/dL   GFR calc non Af Amer >60 >60 mL/min   GFR calc Af Amer >60 >60 mL/min    Comment: (NOTE) The eGFR has been calculated using the CKD EPI equation. This calculation has not been validated in all clinical situations. eGFR's persistently <60 mL/min signify possible Chronic Kidney Disease.    Anion gap 8 5 - 15    Comment: Performed at [facility] [address] [place], [place] [address]  Magnesium     Status: None   Collection Time: 01/15/18  7:11 AM  Result Value Ref Range   Magnesium 1.8 1.7 - 2.4 mg/dL    Comment: Performed at [facility] [address], [place], [place] [address]  Phosphorus     Status: None   Collection Time: 01/15/18  7:11 AM  Result Value Ref Range   Phosphorus 3.5 2.5 - 4.6 mg/dL    Comment: Performed at [facility] [address], [place], [place] [address]  Glucose, capillary  Status: Abnormal    Collection Time: 01/15/18  7:38 AM  Result Value Ref Range   Glucose-Capillary 286 (H) 65 - 99 mg/dL   Comment 1 Notify RN   Glucose, capillary     Status: Abnormal   Collection Time: 01/15/18 11:17 AM  Result Value Ref Range   Glucose-Capillary 134 (H) 65 - 99 mg/dL   Comment 1 Notify RN   Surgical PCR screen     Status: None   Collection Time: 01/15/18 12:51 PM  Result Value Ref Range   MRSA, PCR NEGATIVE NEGATIVE   Staphylococcus aureus NEGATIVE NEGATIVE    Comment: (NOTE) The Xpert SA Assay (FDA approved for NASAL specimens in patients 7 years of age and older), is one component of a comprehensive surveillance program. It is not intended to diagnose infection nor to guide or monitor treatment. Performed at [facility], [place] [address], [place], [place] [address]   Glucose, capillary     Status: None   Collection Time: 01/15/18  5:37 PM  Result Value Ref Range   Glucose-Capillary 68 65 - 99 mg/dL   Comment 1 Notify RN   Glucose, capillary     Status: None   Collection Time: 01/15/18  6:03 PM  Result Value Ref Range   Glucose-Capillary 73 65 - 99 mg/dL   Comment 1 Notify RN   Glucose, capillary     Status: Abnormal   Collection Time: 01/15/18 11:08 PM  Result Value Ref Range   Glucose-Capillary 212 (H) 65 - 99 mg/dL  Basic metabolic panel     Status: Abnormal   Collection Time: 01/16/18  4:36 AM  Result Value Ref Range   Sodium 139 135 - 145 mmol/L   Potassium 3.4 (L) 3.5 - 5.1 mmol/L   Chloride 105 101 - 111 mmol/L   CO2 22 22 - 32 mmol/L   Glucose, Bld 114 (H) 65 - 99 mg/dL   BUN 12 6 - 20 mg/dL   Creatinine, Ser 0.95 0.61 - 1.24 mg/dL   Calcium 8.7 (L) 8.9 - 10.3 mg/dL   GFR calc non Af Amer >60 >60 mL/min   GFR calc Af Amer >60 >60 mL/min    Comment: (NOTE) The eGFR has been calculated using the CKD EPI equation. This calculation has not been validated in all clinical situations. eGFR's persistently <60 mL/min signify possible Chronic  Kidney Disease.    Anion gap 12 5 - 15    Comment: Performed at [facility] [address], [place],  [address]  Glucose, capillary     Status: Abnormal   Collection Time: 01/16/18  7:22 AM  Result Value Ref Range   Glucose-Capillary 131 (H) 65 - 99 mg/dL   Comment 1 Notify RN    ECG   N/A  Telemetry   Sinus rhythm - Personally Reviewed  Radiology    Dg Chest 2 View  Result Date: 01/16/2018 CLINICAL DATA:  Cardiac device in situ EXAM: CHEST  2 VIEW COMPARISON:  01/12/2018 FINDINGS: Lateral view degraded by patient arm position. Interval placement of a single lead pacer/AICD device. This terminates at the right ventricle. Midline trachea. Mild cardiomegaly. Mediastinal contours otherwise within normal limits. No pleural effusion or pneumothorax. Resolved pulmonary venous congestion. Mild subsegmental atelectasis or scarring at the lung bases. IMPRESSION: Placement of a single lead pacer/AICD device. No pneumothorax or other acute complication. Cardiomegaly with resolution of pulmonary venous congestion. Electronically Signed   By: Abigail Miyamoto M.D.   On:  01/16/2018 07:40   Cardiac Studies   ECHO 01/14/18:  LV EF: 20% Study Conclusions - Left ventricle: The cavity size was normal. Wall thickness was   increased in a pattern of mild LVH. Systolic function was   severely reduced. The estimated ejection fraction was 20%.   Diffuse hypokinesis. Features are consistent with a pseudonormal   left ventricular filling pattern, with concomitant abnormal   relaxation and increased filling pressure (grade 2 diastolic   dysfunction). Doppler parameters are consistent with high   ventricular filling pressure. - Left atrium: The atrium was moderately dilated. - Right ventricle: The cavity size was mildly dilated. Systolic   function was moderately to severely reduced.  01/15/18: [employer] ICD Implant  Assessment   Active Problems:   Essential hypertension   Diabetic  neuropathy ([place])   Uncontrolled type 2 diabetes mellitus with hyperglycemia, with long-term current use of insulin (HCC)   Anemia, iron deficiency   Chronic systolic heart failure (HCC)   NICM (nonischemic cardiomyopathy) ([place])   Coronary artery disease involving native coronary artery of native heart without angina pectoris   Hyperlipidemia   Syncope   Plan   1. ICD placed yesterday with minimal to no pain. Site clean, dry. CXR this Am with appropriate placement. Seen by EP today, OK with dc.  2. On Lasix 20m PO daily. Appears euvolemic and renal function improved at 0.95. CXR today also demonstrated resolution of pulmonary venous congestion.  3. Ready for dc today   Length of Stay:  LOS: 2 days   Taryne Kiger 01/16/2018, 8:59 AM  Internal Medicine - PGY2   For questions or updates, please contact [place] consult [email or web address] for contact info under Cardiology/STEMI.

## 2018-01-16 NOTE — Discharge Instructions (Signed)
° ° °  Supplemental Discharge Instructions for  Pacemaker/Defibrillator Patients  Activity No heavy lifting or vigorous activity with your left/right arm for 6 to 8 weeks.  Do not raise your left/right arm above your head for one week.  Gradually raise your affected arm as drawn below.             01/19/18                       01/20/18                      01/21/18                   01/22/18 __  NO DRIVING for  6 months .  WOUND CARE - Keep the wound area clean and dry.  Do not get this area wet, no showers for 24 hours; you may shower on 01/17/18   . - The tape/steri-strips on your wound will fall off; do not pull them off.  No bandage is needed on the site.  DO  NOT apply any creams, oils, or ointments to the wound area. - If you notice any drainage or discharge from the wound, any swelling or bruising at the site, or you develop a fever > 101? F after you are discharged home, call the office at once.  Special Instructions - You are still able to use cellular telephones; use the ear opposite the side where you have your pacemaker/defibrillator.  Avoid carrying your cellular phone near your device. - When traveling through airports, show security personnel your identification card to avoid being screened in the metal detectors.  Ask the security personnel to use the hand wand. - Avoid arc welding equipment, MRI testing (magnetic resonance imaging), TENS units (transcutaneous nerve stimulators).  Call the office for questions about other devices. - Avoid electrical appliances that are in poor condition or are not properly grounded. - Microwave ovens are safe to be near or to operate.  Additional information for defibrillator patients should your device go off: - If your device goes off ONCE and you feel fine afterward, notify the device clinic nurses. - If your device goes off ONCE and you do not feel well afterward, call 911. - If your device goes off TWICE, call 911. - If your device goes off  THREE times in one day, call 911.  DO NOT DRIVE YOURSELF OR A FAMILY MEMBER WITH A DEFIBRILLATOR TO THE HOSPITAL--CALL 911.

## 2018-01-16 NOTE — Progress Notes (Signed)
Progress Note  Patient Name: Jeffrey [name] Date of Encounter: 01/16/2018  Primary Cardiologist: Mertie Moores, MD   Subjective   No CP, palpitations, SOB, minimal if any discomfort at implant site  Inpatient Medications    Scheduled Meds: . aspirin EC  81 mg Oral Daily  . carvedilol  6.25 mg Oral BID WC  . cyclobenzaprine  5 mg Oral QHS  . ferrous sulfate  325 mg Oral Q breakfast  . furosemide  40 mg Oral Daily  . gabapentin  400 mg Oral TID  . insulin aspart  0-15 Units Subcutaneous TID WC  . insulin aspart  4 Units Subcutaneous TID WC  . insulin glargine  65 Units Subcutaneous Q2200  . potassium chloride  10 mEq Oral Daily  . potassium chloride  40 mEq Oral Once  . rosuvastatin  40 mg Oral Daily  . sacubitril-valsartan  1 tablet Oral BID  . sodium chloride flush  3 mL Intravenous Q12H  . spironolactone  25 mg Oral Daily   Continuous Infusions: . sodium chloride    . sodium chloride 50 mL/hr at 01/15/18 1857  .  ceFAZolin (ANCEF) IV Stopped (01/16/18 0529)   PRN Meds: sodium chloride, acetaminophen, acetaminophen, albuterol, ondansetron **OR** ondansetron (ZOFRAN) IV, sodium chloride flush, zolpidem   Vital Signs    Vitals:   01/15/18 1830 01/15/18 1911 01/15/18 2305 01/16/18 0607  BP: (!) 130/98 126/86 (!) 154/96 (!) 130/91  Pulse: 85 86 93 84  Resp: 18 18  18   Temp:  98 F (36.7 C)  97.7 F (36.5 C)  TempSrc:  Oral  Oral  SpO2: 99% 96%  100%  Weight:    196 lb 3.2 oz (89 kg)  Height:        Intake/Output Summary (Last 24 hours) at 01/16/2018 0851 Last data filed at 01/16/2018 0529 Gross per 24 hour  Intake 631.67 ml  Output 700 ml  Net -68.33 ml   Filed Weights   01/14/18 0547 01/15/18 0454 01/16/18 0607  Weight: 197 lb 6.4 oz (89.5 kg) 197 lb 8 oz (89.6 kg) 196 lb 3.2 oz (89 kg)    Telemetry    SR - Personally Reviewed  ECG    SR - Personally Reviewed  Physical Exam   GEN: No acute distress.   Neck: No JVD Cardiac: RRR, no murmurs,  rubs, or gallops.  Respiratory: CTA b/l. GI: Soft, nontender, non-distended  MS: No edema; No deformity. Neuro:  Nonfocal  Psych: Normal affect  ICD site is dry, no hematoma or ecchymosis  Labs    Chemistry Recent Labs  Lab 01/14/18 0808 01/15/18 0711 01/16/18 0436  NA 136 136 139  K 3.8 3.9 3.4*  CL 105 107 105  CO2 22 21* 22  GLUCOSE 241* 310* 114*  BUN 15 17 12   CREATININE 0.91 1.07 0.95  CALCIUM 8.8* 8.6* 8.7*  PROT 6.1* 6.1*  --   ALBUMIN 2.7* 2.7*  --   AST 18 15  --   ALT 18 15*  --   ALKPHOS 69 68  --   BILITOT 0.6 0.6  --   GFRNONAA >60 >60 >60  GFRAA >60 >60 >60  ANIONGAP 9 8 12      Hematology Recent Labs  Lab 01/13/18 1511 01/14/18 0808 01/15/18 0711  WBC 4.2 3.5* 4.3  RBC 5.21 5.32 5.26  HGB 12.7* 13.2 12.8*  HCT 40.7 41.4 41.5  MCV 78.1 77.8* 78.9  MCH 24.4* 24.8* 24.3*  MCHC 31.2 31.9 30.8  RDW 15.8* 15.7* 15.7*  PLT 243 231 244    Cardiac Enzymes Recent Labs  Lab 01/12/18 2325 01/13/18 0221 01/13/18 0857 01/13/18 1511  TROPONINI <0.03 <0.03 <0.03 <0.03   No results for input(s): TROPIPOC in the last 168 hours.   BNP Recent Labs  Lab 01/12/18 2324  BNP 34.3     DDimer No results for input(s): DDIMER in the last 168 hours.   Radiology    Dg Chest 2 View Result Date: 01/16/2018 CLINICAL DATA:  Cardiac device in situ EXAM: CHEST  2 VIEW COMPARISON:  01/12/2018 FINDINGS: Lateral view degraded by patient arm position. Interval placement of a single lead pacer/AICD device. This terminates at the right ventricle. Midline trachea. Mild cardiomegaly. Mediastinal contours otherwise within normal limits. No pleural effusion or pneumothorax. Resolved pulmonary venous congestion. Mild subsegmental atelectasis or scarring at the lung bases. IMPRESSION: Placement of a single lead pacer/AICD device. No pneumothorax or other acute complication. Cardiomegaly with resolution of pulmonary venous congestion. Electronically Signed   By: Abigail Miyamoto  M.D.   On: 01/16/2018 07:40    Cardiac Studies   01/14/18: TTE Study Conclusions - Left ventricle: The cavity size was normal. Wall thickness was increased in a pattern of mild LVH. Systolic function was severely reduced. The estimated ejection fraction was 20%. Diffuse hypokinesis. Features are consistent with a pseudonormal left ventricular filling pattern, with concomitant abnormal relaxation and increased filling pressure (grade 2 diastolic dysfunction). Doppler parameters are consistent with high ventricular filling pressure. - Left atrium: The atrium was moderately dilated. - Right ventricle: The cavity size was mildly dilated. Systolic function was moderately to severely reduced.  01/29/17: TTE w/LVEF 20-25%  01/31/17: LHC  1st RPLB lesion, 60 %stenosed.  Dist RCA lesion, 60 %stenosed.  Prox RCA lesion, 55 %stenosed.  Prox LAD lesion, 10 %stenosed.  There is severe left ventricular systolic dysfunction.  LV end diastolic pressure is mildly elevated. Severe global LV dysfunction with an ejection fraction of 10-15%. The pattern is one of a nonischemic cardiomyopathy. Mild coronary obstructive disease with smooth 10% narrowing in the LAD; normal ramus intermediate, normal left circumflex; and RCA with 50-60% proximal stenosis and distal tapering of 60%. Prior to giving rise to 3 small distal branches with 60% narrowing in a small inferior LV branch. The patient's LV dysfunction is out of proportion to his CAD. RECOMMENDATION: With the patient's severe LV dysfunction, it is recommended that the patient be transferred to 4 N stepdown unit rather than return back to [facility]. Initiation of medical therapy with carvedilol, spironolactone, and probable initiation of angiotension receptorblocker/neprilysin inhibition therapy. Consider short-term life-vest prior to discharge to allow for possible medication induced improvement in LV function.  07/31/14: TTE  [facility]) LVEF 45-50% 10/01/11: [facility], TTE LVEF 55-60%     Patient Profile     52 y.o. male with a hx of non-obstructive CAD, NICM, IDDM, HTN, HLD, prior toe amputations 2/2 osteomyelitis, chronic CHF, admitted with syncope  Assessment & Plan    1. Syncope     Patient offered very little in details, no apparent warning, brief in duration 2. NICM     He has been rx good medical regime, on/off about meds until 2 mo ago with reported compliance  Now is s/p ICD implant yesterday w/Dr. Caryl Comes Site looks good Site/wound care and activity restrictions were d/w the patient/girlfriend.  I have provided a work note for 6 weeks given nature of his job ICD check this AM noted intact function  CXR this AM with stable lead position, no PTX routine post device EP follow up has been arranged. OK to discharge from our perspective, once last antibiotic does is completed this morning Dr. Caryl Comes made the patient/family aware of [place] law, no driving for 6 months   3. Chronic CHF     Exam does not suggest fluid OL at this time   EP service remains available, please recall if needed    For questions or updates, please contact [place] Please consult [email or web address] for contact info under Cardiology/STEMI.      Signed, Baldwin [place], PA-C  01/16/2018, 8:51 AM

## 2018-01-16 NOTE — Discharge Summary (Signed)
Physician Discharge Summary  Jeffrey [name] YQI:[id number] DOB: 1966/06/22 DOA: 01/12/2018  PCP: Charlott Rakes, MD  Admit date: 01/12/2018 Discharge date: 01/16/2018  Admitted From: Home Disposition: Home  Recommendations for Outpatient Follow-up:  1. Follow up with PCP in 1-2 weeks 2. Follow up with Cardiology Dr. Acie Fredrickson in 1-2 weeks 3. Follow up with EP Cardiology Dr. Caryl Comes in 1 week 4. Please obtain CMP/CBC, Mag, Phos in one week 5. Please follow up on the following pending results:  Home Health: No Equipment/Devices: None   Discharge Condition: FULL CODE CODE STATUS: FULL CODE Diet recommendation: Heart Healthy Carb Modified Diet  Brief/Interim Summary: CurtisRobersonis a51 y.o.male,with past medical history significant for chronic systolic and diastolic congestive heart failure, coronary artery disease presenting with a syncopal episode that happened yesterday at night. Patient lives at home with a friend and he passed out from a standing position. He denies any preceding chest pains or palpitations. Patient did not feel like coming to the hospital yesterday. No history of seizures. Patient has a history of congestive heart failure with ejection fraction 20-25% and he is supposed to follow-up with an echo to be evaluated for defibrillator placemen but failed to follow-up. Given concern for Cardiac Etiology, Cardiology was consulted and recommended repeating ECHO. Case was discussed with Dr. Oval Linsey and because EF remains low will have EP evaluate for AICD placement. Dr. Caryl Comes of EP saw patient and felt ICD was indicated so it was placed yesterday. Follow up CXR showed Placement of a single lead pacer/AICD device. No pneumothorax or other acute complication. Cardiomegaly with resolution of pulmonary venous congestion. Patient was seen and evaluated and deemed medically stable to D/C Home and follow up with PCP, Cardiology, and EP Cardiology as an outpatient.   Discharge Diagnoses:   Active Problems:   Essential hypertension   Diabetic neuropathy ([place])   Uncontrolled type 2 diabetes mellitus with hyperglycemia, with long-term current use of insulin (HCC)   Anemia, iron deficiency   Chronic systolic heart failure (HCC)   NICM (nonischemic cardiomyopathy) ([place])   Coronary artery disease involving native coronary artery of native heart without angina pectoris   Hyperlipidemia   Syncope  Syncope  -CT Head and Cervical Spine showed No acute intracranial abnormalities.  Nonspecific reversal of the usual cervical lordosis. Mild degenerative changes in the cervical spine. No acute displaced fractures identified. -ECHOCardiogram done and showed The estimated ejection fraction was 20%.Diffuse hypokinesis. Features are consistent with a pseudonormal left ventricular filling pattern, with concomitant abnormal relaxation and increased filling pressure (grade 2 diastolic dysfunction). Doppler parameters are consistent with high ventricular filling pressure. -Troponin I x 4 were <0.03 -Orthostatic Vital Signs done and Negative -Neurochecks -Etiology highly concerning for Cardiac Arrythmia -Cardiology consulted for Evaluation and appreciate Recc's;  -Given ECHOCardiogram findings and history needed AICD and EP Cardiology evaluated and placed ICD yesterday -Patient deemed stable to D/C and follow up with Cardiology and and EP Cardiology at D/C  Chronic Combined Systolic and Grade 2 Diastolic Congestive Heart failure with an Ejection Fraction of 20-25% -BNP was 34.3 -Heart Healthy/Carb Modified Diet -Currently not Decompensated  -Strict I's/O's, Daily Weights, SLIV, and Fluid Restrict 1500 mL -C/w Carvedilol 6.25 mg po BID, Sacubitril-Valsartan 1 tab po BID, Furosemide 40 mg po Daily -ECHOCardiogram done and showed The estimated ejection fraction was 20%.Diffuse hypokinesis. Features are consistent with a pseudonormal left ventricular filling pattern, with concomitant abnormal  relaxation and increased filling pressure (grade 2 diastolic dysfunction). Doppler parameters are consistent with high ventricular filling  pressure. -Cardiology Consulted for further evaluation -Cardiology added Spironolactone 25 mg po Daily and are consulting EP for evaluation for AICD -EP evaluated and will placed ICD yesterday  -Patient is - 531.3 mL and Weight remains 197 -Appears Euvolemic; CXR shows Cardiomegaly with resolution of pulmonary Vascular Congestion  -Continue to Monitor Volume Status Closely as outpatient -Follow up with Cardiology Dr. Acie Fredrickson as an outpatient   Hx of CAD/NICM -No Chest Pain -Troponin I x 4 were <0.03 -C/w ASA 81 mg po Daily, Sacubitril-Valsartan 24-36 mg po BID, Carvedilol 6.25 mg po BID, and Rosuvstatin 40 mg po Daily  -Cardiology following and appreciate Further Recc's  Uncontrolled Diabetes Mellitus Type 2 complicated by Neuropathy -Checked HbA1c on 12/20/17 and was 13.8 -Restarted Lantus 65 units sq Daily -C/w Moderate Novolog SSI AC and Novolog 4 untis TIDwm; Will change to Moderate SSI q4h if patient remains prolonged NPO -CBG's ranging from 72-131 -C/w Gabapentin 400 mg po TID   HLD -C/w Sacubitril-Valsartan 24-36 mg po BID, Carvedilol 6.25 mg po BID, Spironolactone 25 mg po Daily, and Furosemide 40 mg po Daily   HLD -C/w Rosuvastatin 40 mg po Daily   Hx of Iron Deficiency Anemia -[name] Hb/Hct went from 12.7/40.7 -> 13.2/41.4 -> 12.8/41.5 -> 13.0/42.0 -MCV was 77.8 -Continue to Monitor for S/Sx of Bleeding -C/w Ferrous Sulfate 325 mg po Daily  -Repeat CBC as an outpatient   Leukopenia -Likely Reactive; WBC went from 4.5 -> 4.2 -> 3.5 -> 4.3 -> 5.6 -Continue to Monitor and Repeat CBC  As an outpatient  Hypokalemia -[name] K+ was 3.4 -Replete with po KCl  -Continue to Monitor and repeat CMP as an outpatient   Discharge Instructions  Discharge Instructions    (HEART FAILURE PATIENTS) Call MD:  Anytime you have any of the  following symptoms: 1) 3 pound weight gain in 24 hours or 5 pounds in 1 week 2) shortness of breath, with or without a dry hacking cough 3) swelling in the hands, feet or stomach 4) if you have to sleep on extra pillows at night in order to breathe.   Complete by:  As directed    Call MD for:  difficulty breathing, headache or visual disturbances   Complete by:  As directed    Call MD for:  extreme fatigue   Complete by:  As directed    Call MD for:  hives   Complete by:  As directed    Call MD for:  persistant dizziness or light-headedness   Complete by:  As directed    Call MD for:  persistant nausea and vomiting   Complete by:  As directed    Call MD for:  redness, tenderness, or signs of infection (pain, swelling, redness, odor or green/yellow discharge around incision site)   Complete by:  As directed    Call MD for:  severe uncontrolled pain   Complete by:  As directed    Call MD for:  temperature >100.4   Complete by:  As directed    Diet - low sodium heart healthy   Complete by:  As directed    Diet Carb Modified   Complete by:  As directed    Discharge instructions   Complete by:  As directed    Follow up with PCP, Cardiology Dr. Acie Fredrickson, and Cardiology Dr. Caryl Comes. Take all medications as prescribed. If symptoms change or worsen please return to the ED for evaluation.   Increase activity slowly   Complete by:  As directed  Allergies as of 01/16/2018   No Known Allergies     Medication List    TAKE these medications   acetaminophen 500 MG tablet Commonly known as:  TYLENOL Take 1,000 mg by mouth every 6 (six) hours as needed for headache (pain).   albuterol 108 (90 Base) MCG/ACT inhaler Commonly known as:  PROVENTIL HFA;VENTOLIN HFA Inhale 1-2 puffs into the lungs every 6 (six) hours as needed for wheezing or shortness of breath.   aspirin 81 MG EC tablet Take 1 tablet (81 mg total) by mouth daily.   carvedilol 6.25 MG tablet Commonly known as:  COREG Take 1  tablet (6.25 mg total) by mouth 2 (two) times daily with a meal.   cyclobenzaprine 5 MG tablet Commonly known as:  FLEXERIL Take 1 tablet (5 mg total) at bedtime by mouth. What changed:    when to take this  reasons to take this   ferrous sulfate 325 (65 FE) MG tablet Take 1 tablet (325 mg total) by mouth 2 (two) times daily with a meal. What changed:  when to take this   furosemide 40 MG tablet Commonly known as:  LASIX Take 1 tablet (40 mg total) by mouth daily.   gabapentin 400 MG capsule Commonly known as:  NEURONTIN Take 1 capsule (400 mg total) by mouth 3 (three) times daily.   glucose blood test strip Commonly known as:  TRUE METRIX BLOOD GLUCOSE TEST Use 3 times daily before meals   insulin aspart 100 UNIT/ML injection Commonly known as:  novoLOG Inject 0-15 Units into the skin 3 (three) times daily with meals. Sliding scale  CBG 70 - 120: 0 units: CBG 121 - 140: 2 units; CBG 140 - 200: 4 units; CBG 201 - 240: 6 units; CBG 240 - 300: 8 units;CBG 300 - 350: 12 units; CBG 351 - 400: 16 units; CBG > 400 : 16 units and notify your  MD   Insulin Glargine 100 UNIT/ML Solostar Pen Commonly known as:  LANTUS SOLOSTAR Inject 65 Units into the skin daily at 10 pm.   potassium chloride 10 MEQ tablet Commonly known as:  K-DUR Take 1 tablet (10 mEq total) by mouth daily.   rosuvastatin 40 MG tablet Commonly known as:  CRESTOR Take 1 tablet (40 mg total) by mouth daily.   sacubitril-valsartan 24-26 MG Commonly known as:  ENTRESTO Take 1 tablet by mouth 2 (two) times daily.   spironolactone 25 MG tablet Commonly known as:  ALDACTONE Take 1 tablet (25 mg total) by mouth daily.   TRUE METRIX METER Devi 1 each by Does not apply route 3 (three) times daily before meals.   TRUEPLUS LANCETS 28G Misc 1 each by Does not apply route 3 (three) times daily before meals.      Follow-up Information    [facility] Follow up on 01/29/2018.   Specialty:   Cardiology Why:  11:00AM, wound check visit Contact information: [address] [place]       Deboraha Sprang, MD Follow up on 04/12/2018.   Specialty:  Cardiology Why:  2:15PM Contact information: [address] [place] [address] [phone]        Nahser, Wonda Cheng, MD. Call.   Specialty:  Cardiology Why:  Follow up within 1 week Contact information: [address] [place] [address] [phone]        Charlott Rakes, MD. Call.  Specialty:  Family Medicine Why:  Follow up within 1 week Contact information: [place] [address] [phone]          No Known Allergies  Consultations:  Cardiology Dr. Jonelle Sidle Panacea/Dr. Lyman Bishop   EP Dr. Virl Axe   Procedures/Studies: Dg Chest 2 View  Result Date: 01/16/2018 CLINICAL DATA:  Cardiac device in situ EXAM: CHEST  2 VIEW COMPARISON:  01/12/2018 FINDINGS: Lateral view degraded by patient arm position. Interval placement of a single lead pacer/AICD device. This terminates at the right ventricle. Midline trachea. Mild cardiomegaly. Mediastinal contours otherwise within normal limits. No pleural effusion or pneumothorax. Resolved pulmonary venous congestion. Mild subsegmental atelectasis or scarring at the lung bases. IMPRESSION: Placement of a single lead pacer/AICD device. No pneumothorax or other acute complication. Cardiomegaly with resolution of pulmonary venous congestion. Electronically Signed   By: Abigail Miyamoto M.D.   On: 01/16/2018 07:40   Dg Chest 2 View  Result Date: 01/12/2018 CLINICAL DATA:  Initial evaluation for acute syncope. EXAM: CHEST  2 VIEW COMPARISON:  Prior radiograph from 10/20/17. FINDINGS: Mild cardiomegaly, stable.  Mediastinal silhouette. The lungs are normally inflated. No airspace consolidation. Superimposed mild left basilar atelectasis. Mild diffuse vascular  congestion without frank pulmonary edema. No pleural effusion. No pneumothorax. No acute osseous abnormality identified. IMPRESSION: 1. Stable cardiomegaly with mild diffuse pulmonary vascular congestion without frank pulmonary edema. 2. Superimposed mild left basilar atelectasis. Electronically Signed   By: Jeannine Boga M.D.   On: 01/12/2018 23:56   Ct Head Wo Contrast  Result Date: 01/12/2018 CLINICAL DATA:  Patient passed out and fell forward onto a wooden floor yesterday. Headache and neck pain. EXAM: CT HEAD WITHOUT CONTRAST CT CERVICAL SPINE WITHOUT CONTRAST TECHNIQUE: Multidetector CT imaging of the head and cervical spine was performed following the standard protocol without intravenous contrast. Multiplanar CT image reconstructions of the cervical spine were also generated. COMPARISON:  None. FINDINGS: CT HEAD FINDINGS Brain: No evidence of acute infarction, hemorrhage, hydrocephalus, extra-axial collection or mass lesion/mass effect. Vascular: No hyperdense vessel or unexpected calcification. Skull: Normal. Negative for fracture or focal lesion. Sinuses/Orbits: No acute finding. Other: None. CT CERVICAL SPINE FINDINGS Alignment: Reversal of the usual cervical lordosis without anterior subluxation. This may be due to patient positioning but ligamentous injury or muscle spasm could also have this appearance and are not excluded. Normal alignment of the facet joints. C1-2 articulation appears intact. Skull base and vertebrae: Skull base appears intact. No vertebral compression deformities. No focal bone lesion or bone destruction. Soft tissues and spinal canal: No prevertebral soft tissue swelling. No paraspinal soft tissue mass or infiltration. Disc levels: Degenerative changes with disc space narrowing and endplate hypertrophic changes at C4-5, C5-6, and C6-7 levels. Upper chest: Lung apices are clear. Other: None. IMPRESSION: 1. No acute intracranial abnormalities. 2. Nonspecific reversal of the  usual cervical lordosis. Mild degenerative changes in the cervical spine. No acute displaced fractures identified. Electronically Signed   By: Lucienne Capers M.D.   On: 01/12/2018 23:55   Ct Cervical Spine Wo Contrast  Result Date: 01/12/2018 CLINICAL DATA:  Patient passed out and fell forward onto a wooden floor yesterday. Headache and neck pain. EXAM: CT HEAD WITHOUT CONTRAST CT CERVICAL SPINE WITHOUT CONTRAST TECHNIQUE: Multidetector CT imaging of the head and cervical spine was performed following the standard protocol without intravenous contrast. Multiplanar CT image reconstructions of the cervical spine were also generated. COMPARISON:  None. FINDINGS: CT HEAD FINDINGS Brain: No evidence of  acute infarction, hemorrhage, hydrocephalus, extra-axial collection or mass lesion/mass effect. Vascular: No hyperdense vessel or unexpected calcification. Skull: Normal. Negative for fracture or focal lesion. Sinuses/Orbits: No acute finding. Other: None. CT CERVICAL SPINE FINDINGS Alignment: Reversal of the usual cervical lordosis without anterior subluxation. This may be due to patient positioning but ligamentous injury or muscle spasm could also have this appearance and are not excluded. Normal alignment of the facet joints. C1-2 articulation appears intact. Skull base and vertebrae: Skull base appears intact. No vertebral compression deformities. No focal bone lesion or bone destruction. Soft tissues and spinal canal: No prevertebral soft tissue swelling. No paraspinal soft tissue mass or infiltration. Disc levels: Degenerative changes with disc space narrowing and endplate hypertrophic changes at C4-5, C5-6, and C6-7 levels. Upper chest: Lung apices are clear. Other: None. IMPRESSION: 1. No acute intracranial abnormalities. 2. Nonspecific reversal of the usual cervical lordosis. Mild degenerative changes in the cervical spine. No acute displaced fractures identified. Electronically Signed   By: Lucienne Capers  M.D.   On: 01/12/2018 23:55    ECHOCARDIOGRAM ------------------------------------------------------------------- Study Conclusions  - Left ventricle: The cavity size was normal. Wall thickness was increased in a pattern of mild LVH. Systolic function was severely reduced. The estimated ejection fraction was 20%. Diffuse hypokinesis. Features are consistent with a pseudonormal left ventricular filling pattern, with concomitant abnormal relaxation and increased filling pressure (grade 2 diastolic dysfunction). Doppler parameters are consistent with high ventricular filling pressure. - Left atrium: The atrium was moderately dilated. - Right ventricle: The cavity size was mildly dilated. Systolic function was moderately to severely reduced.  Subjective: Seen and examined at bedside and denied any CP or SOB. Felt good and no Nausea or Vomiting. Ready to go home.  Discharge Exam: Vitals:   01/15/18 2305 01/16/18 0607  BP: (!) 154/96 (!) 130/91  Pulse: 93 84  Resp:  18  Temp:  97.7 F (36.5 C)  SpO2:  100%   Vitals:   01/15/18 1830 01/15/18 1911 01/15/18 2305 01/16/18 0607  BP: (!) 130/98 126/86 (!) 154/96 (!) 130/91  Pulse: 85 86 93 84  Resp: 18 18  18   Temp:  98 F (36.7 C)  97.7 F (36.5 C)  TempSrc:  Oral  Oral  SpO2: 99% 96%  100%  Weight:    89 kg (196 lb 3.2 oz)  Height:       General: Pt is alert, awake, not in acute distress Cardiovascular: RRR, S1/S2 +, no rubs, no gallops Respiratory: CTA bilaterally, no wheezing, no rhonchi Abdominal: Soft, NT, ND, bowel sounds + Extremities: no edema, no cyanosis  The results of significant diagnostics from this hospitalization (including imaging, microbiology, ancillary and laboratory) are listed below for reference.    Microbiology: Recent Results (from the past 240 hour(s))  Surgical PCR screen     Status: None   Collection Time: 01/15/18 12:51 PM  Result Value Ref Range Status   MRSA, PCR NEGATIVE  NEGATIVE Final   Staphylococcus aureus NEGATIVE NEGATIVE Final    Comment: (NOTE) The Xpert SA Assay (FDA approved for NASAL specimens in patients 28 years of age and older), is one component of a comprehensive surveillance program. It is not intended to diagnose infection nor to guide or monitor treatment. Performed at [facility], [place] [address], [place], [place] [address]     Labs: BNP (last 3 results) Recent Labs    01/28/17 2127 01/12/18 2324  BNP 206.5* 70.9   Basic Metabolic Panel: Recent Labs  Lab 01/12/18 2020 01/12/18 2325 01/13/18 0221 01/13/18 0857 01/14/18 0808 01/15/18 0711 01/16/18 0436  NA 139  --   --  140 136 136 139  K 3.3*  --   --  3.6 3.8 3.9 3.4*  CL 103  --   --  107 105 107 105  CO2 26  --   --  25 22 21* 22  GLUCOSE 190*  --   --  124* 241* 310* 114*  BUN 20  --   --  16 15 17 12   CREATININE 1.11  --  0.93 0.89 0.91 1.07 0.95  CALCIUM 9.0  --   --  8.8* 8.8* 8.6* 8.7*  MG  --  1.8  --   --  1.7 1.8 1.7  PHOS  --   --   --   --  3.0 3.5 3.9   Liver Function Tests: Recent Labs  Lab 01/14/18 0808 01/15/18 0711  AST 18 15  ALT 18 15*  ALKPHOS 69 68  BILITOT 0.6 0.6  PROT 6.1* 6.1*  ALBUMIN 2.7* 2.7*   No results for input(s): LIPASE, AMYLASE in the last 168 hours. No results for input(s): AMMONIA in the last 168 hours. CBC: Recent Labs  Lab 01/13/18 0221 01/13/18 1511 01/14/18 0808 01/15/18 0711 01/16/18 0436  WBC 4.5 4.2 3.5* 4.3 5.6  NEUTROABS  --   --  1.7 2.3 3.4  HGB 12.8* 12.7* 13.2 12.8* 13.0  HCT 41.2 40.7 41.4 41.5 42.0  MCV 78.2 78.1 77.8* 78.9 77.8*  PLT 248 243 231 244 243   Cardiac Enzymes: Recent Labs  Lab 01/12/18 2325 01/13/18 0221 01/13/18 0857 01/13/18 1511  TROPONINI <0.03 <0.03 <0.03 <0.03   BNP: Invalid input(s): POCBNP CBG: Recent Labs  Lab 01/15/18 1117 01/15/18 1737 01/15/18 1803 01/15/18 2308 01/16/18 0722  GLUCAP 134* 68 73 212* 131*   D-Dimer No results for input(s):  DDIMER in the last 72 hours. Hgb A1c No results for input(s): HGBA1C in the last 72 hours. Lipid Profile No results for input(s): CHOL, HDL, LDLCALC, TRIG, CHOLHDL, LDLDIRECT in the last 72 hours. Thyroid function studies No results for input(s): TSH, T4TOTAL, T3FREE, THYROIDAB in the last 72 hours.  Invalid input(s): FREET3 Anemia work up No results for input(s): VITAMINB12, FOLATE, FERRITIN, TIBC, IRON, RETICCTPCT in the last 72 hours. Urinalysis    Component Value Date/Time   COLORURINE YELLOW 01/12/2018 2050   APPEARANCEUR CLEAR 01/12/2018 2050   LABSPEC 1.025 01/12/2018 2050   PHURINE 6.0 01/12/2018 2050   GLUCOSEU 50 (A) 01/12/2018 2050   HGBUR SMALL (A) 01/12/2018 2050   BILIRUBINUR NEGATIVE 01/12/2018 2050   BILIRUBINUR neg 03/30/2016 1116   [place] 01/12/2018 2050   PROTEINUR >=300 (A) 01/12/2018 2050   UROBILINOGEN 0.2 03/30/2016 1116   UROBILINOGEN 1.0 03/10/2014 2230   NITRITE NEGATIVE 01/12/2018 2050   LEUKOCYTESUR NEGATIVE 01/12/2018 2050   Sepsis Labs Invalid input(s): PROCALCITONIN,  WBC,  LACTICIDVEN Microbiology Recent Results (from the past 240 hour(s))  Surgical PCR screen     Status: None   Collection Time: 01/15/18 12:51 PM  Result Value Ref Range Status   MRSA, PCR NEGATIVE NEGATIVE Final   Staphylococcus aureus NEGATIVE NEGATIVE Final    Comment: (NOTE) The Xpert SA Assay (FDA approved for NASAL specimens in patients 71 years of age and older), is one component of a comprehensive surveillance program. It is not intended to diagnose infection nor to guide or monitor treatment. Performed at [facility],  [address], [place], [place] [address]    Time coordinating discharge: 35 minutes  SIGNED:  Kerney Elbe, DO [facility] 01/16/2018, 10:00 AM Pager [phone]  If 7PM-7AM, please contact night-coverage [email or web address] Password [email or web address]

## 2018-01-16 NOTE — Telephone Encounter (Signed)
Met with patient while he was in the hospital. Introduced myself to him and explained  the [facility] clinic to patient. Patient agreed to be seen and informed me that he will be needing help with transporation. A hospital f/u appointment was scheduled for Friday, 2/8 at 10 am.   During our conversation patient stated that he lives in a second floor apartment. He uses the elevator to go upstairs. He has support from family and friends. Patient uses a glucometer and has a scale to weigh himself. At the moment patient receives $16 of food stamp but hopes that it will be increase after he notifies DSS that he has been out of work for 6 weeks. Patient worked part time prior to hospitalization. Patient also informed me that someone in the hospital had helped him with his Medicaid application.   Patient confirmed that the phone number we have for him on Epic is correct [phone]. Patient also confirmed that Claris Pong is still his emergency contact and her phone number is [phone]. Informed patient that we would be contacting him later on in the week to check on his status and arrange transportation for his appointment on 01/19/18. Patient understood. No further questions.  Spoke briefly with floor case manager, Hassan Rowan, and informed her that a hospital f/u appointment was scheduled for patient. Hassan Rowan stated that she would add information to patient's discharge documents.

## 2018-01-17 ENCOUNTER — Ambulatory Visit: Payer: Self-pay | Admitting: Family Medicine

## 2018-01-17 ENCOUNTER — Telehealth: Payer: Self-pay

## 2018-01-17 NOTE — Telephone Encounter (Signed)
[facility] Post-discharge Follow-Up Phone Call:  Date of Discharge: 01/16/2018 Principal Discharge Diagnosis(es):  Syncope, HTN, chronic combined systolic and diastolic CHF, CAD, s/p ICD Post-discharge Communication: call placed to the patient. His friend,Barabara was with him and present for the call.  Call Completed: Yes With Whom: patient Interpreter Needed:No     Please check all that apply:  X  Patient is knowledgeable of his/her condition(s) and/or treatment. ? Patient is caring for self at home.  X  Patient is receiving assist at home from family and/or caregiver. Family and/or caregiver is knowledgeable of patient's condition(s) and/or treatment. - His friend, [name], is with him and providing assistance as needed.  ? Patient is receiving home health services. If so, name of agency.     Medication Reconciliation:  ? Medication list reviewed with patient. X  Patient obtained all discharge medications. If not, why? - he said that he has all of his medications, noting he just picked up the aldactone and will need to pick up some more " iron pills."  When asked if he would like to review his medication list, he stated " I have all of them." he did not report any need to review the medications and said that he did not have any questions about his medications.   Activities of Daily Living:  X  Independent - has scale and weighs himself daily and logs weight. He said he was 196 lbs today and was 198 yesterday in the hospital.  He also checks his blood sugars as ordered. This morning his blood sugar was 92 and he said he will check it again this afternoon. He noted that he also keeps a log of his blood sugars.  ? Needs assist (describe; ? home DME used) ? [facility] (describe, ? home DME used)   Community resources in place for patient:  X  None  ? Home Health/Home DME ? Assisted Living ? Support Group        Questions/Concerns discussed: He said that he is  feeling ok, still pain at his surgical site from the ICD implant. He denied any chest pain, and said that the surgical site is clean and dry , no draining , no sign of infection. No fever reported. Reviewed when to contact his provider and when to go to ED.  He also reported no questions or concerns.  He confirmed his appointment at [facility] on 01/19/18 @ 1000 and stated that he would be there early for [facility] card walk in hours that day.

## 2018-01-19 ENCOUNTER — Encounter: Payer: Self-pay | Admitting: Family Medicine

## 2018-01-19 ENCOUNTER — Ambulatory Visit: Payer: Self-pay | Attending: Family Medicine | Admitting: Family Medicine

## 2018-01-19 VITALS — BP 154/95 | HR 90 | Temp 98.4°F | Ht 67.0 in | Wt 200.8 lb

## 2018-01-19 DIAGNOSIS — I10 Essential (primary) hypertension: Secondary | ICD-10-CM

## 2018-01-19 DIAGNOSIS — E785 Hyperlipidemia, unspecified: Secondary | ICD-10-CM

## 2018-01-19 DIAGNOSIS — Z95810 Presence of automatic (implantable) cardiac defibrillator: Secondary | ICD-10-CM

## 2018-01-19 DIAGNOSIS — E1165 Type 2 diabetes mellitus with hyperglycemia: Secondary | ICD-10-CM

## 2018-01-19 DIAGNOSIS — I110 Hypertensive heart disease with heart failure: Secondary | ICD-10-CM | POA: Insufficient documentation

## 2018-01-19 DIAGNOSIS — Z89411 Acquired absence of right great toe: Secondary | ICD-10-CM | POA: Insufficient documentation

## 2018-01-19 DIAGNOSIS — Z79899 Other long term (current) drug therapy: Secondary | ICD-10-CM | POA: Insufficient documentation

## 2018-01-19 DIAGNOSIS — Z7982 Long term (current) use of aspirin: Secondary | ICD-10-CM | POA: Insufficient documentation

## 2018-01-19 DIAGNOSIS — Z794 Long term (current) use of insulin: Secondary | ICD-10-CM

## 2018-01-19 DIAGNOSIS — I2510 Atherosclerotic heart disease of native coronary artery without angina pectoris: Secondary | ICD-10-CM | POA: Insufficient documentation

## 2018-01-19 DIAGNOSIS — Z89421 Acquired absence of other right toe(s): Secondary | ICD-10-CM | POA: Insufficient documentation

## 2018-01-19 DIAGNOSIS — I428 Other cardiomyopathies: Secondary | ICD-10-CM

## 2018-01-19 DIAGNOSIS — E1140 Type 2 diabetes mellitus with diabetic neuropathy, unspecified: Secondary | ICD-10-CM | POA: Insufficient documentation

## 2018-01-19 DIAGNOSIS — I5022 Chronic systolic (congestive) heart failure: Secondary | ICD-10-CM | POA: Insufficient documentation

## 2018-01-19 LAB — GLUCOSE, POCT (MANUAL RESULT ENTRY): POC Glucose: 66 mg/dl — AB (ref 70–99)

## 2018-01-19 NOTE — Patient Instructions (Signed)
Diabetes Mellitus and Nutrition When you have diabetes (diabetes mellitus), it is very important to have healthy eating habits because your blood sugar (glucose) levels are greatly affected by what you eat and drink. Eating healthy foods in the appropriate amounts, at about the same times every day, can help you:  Control your blood glucose.  Lower your risk of heart disease.  Improve your blood pressure.  Reach or maintain a healthy weight.  Every person with diabetes is different, and each person has different needs for a meal plan. Your health care provider may recommend that you work with a diet and nutrition specialist (dietitian) to make a meal plan that is best for you. Your meal plan may vary depending on factors such as:  The calories you need.  The medicines you take.  Your weight.  Your blood glucose, blood pressure, and cholesterol levels.  Your activity level.  Other health conditions you have, such as heart or kidney disease.  How do carbohydrates affect me? Carbohydrates affect your blood glucose level more than any other type of food. Eating carbohydrates naturally increases the amount of glucose in your blood. Carbohydrate counting is a method for keeping track of how many carbohydrates you eat. Counting carbohydrates is important to keep your blood glucose at a healthy level, especially if you use insulin or take certain oral diabetes medicines. It is important to know how many carbohydrates you can safely have in each meal. This is different for every person. Your dietitian can help you calculate how many carbohydrates you should have at each meal and for snack. Foods that contain carbohydrates include:  Bread, cereal, rice, pasta, and crackers.  Potatoes and corn.  Peas, beans, and lentils.  Milk and yogurt.  Fruit and juice.  Desserts, such as cakes, cookies, ice cream, and candy.  How does alcohol affect me? Alcohol can cause a sudden decrease in blood  glucose (hypoglycemia), especially if you use insulin or take certain oral diabetes medicines. Hypoglycemia can be a life-threatening condition. Symptoms of hypoglycemia (sleepiness, dizziness, and confusion) are similar to symptoms of having too much alcohol. If your health care provider says that alcohol is safe for you, follow these guidelines:  Limit alcohol intake to no more than 1 drink per day for nonpregnant women and 2 drinks per day for men. One drink equals 12 oz of beer, 5 oz of wine, or 1 oz of hard liquor.  Do not drink on an empty stomach.  Keep yourself hydrated with water, diet soda, or unsweetened iced tea.  Keep in mind that regular soda, juice, and other mixers may contain a lot of sugar and must be counted as carbohydrates.  What are tips for following this plan? Reading food labels  Start by checking the serving size on the label. The amount of calories, carbohydrates, fats, and other nutrients listed on the label are based on one serving of the food. Many foods contain more than one serving per package.  Check the total grams (g) of carbohydrates in one serving. You can calculate the number of servings of carbohydrates in one serving by dividing the total carbohydrates by 15. For example, if a food has 30 g of total carbohydrates, it would be equal to 2 servings of carbohydrates.  Check the number of grams (g) of saturated and trans fats in one serving. Choose foods that have low or no amount of these fats.  Check the number of milligrams (mg) of sodium in one serving. Most people   should limit total sodium intake to less than 2,300 mg per day.  Always check the nutrition information of foods labeled as "low-fat" or "nonfat". These foods may be higher in added sugar or refined carbohydrates and should be avoided.  Talk to your dietitian to identify your daily goals for nutrients listed on the label. Shopping  Avoid buying canned, premade, or processed foods. These  foods tend to be high in fat, sodium, and added sugar.  Shop around the outside edge of the grocery store. This includes fresh fruits and vegetables, bulk grains, fresh meats, and fresh dairy. Cooking  Use low-heat cooking methods, such as baking, instead of high-heat cooking methods like deep frying.  Cook using healthy oils, such as olive, canola, or sunflower oil.  Avoid cooking with butter, cream, or high-fat meats. Meal planning  Eat meals and snacks regularly, preferably at the same times every day. Avoid going long periods of time without eating.  Eat foods high in fiber, such as fresh fruits, vegetables, beans, and whole grains. Talk to your dietitian about how many servings of carbohydrates you can eat at each meal.  Eat 4-6 ounces of lean protein each day, such as lean meat, chicken, fish, eggs, or tofu. 1 ounce is equal to 1 ounce of meat, chicken, or fish, 1 egg, or 1/4 cup of tofu.  Eat some foods each day that contain healthy fats, such as avocado, nuts, seeds, and fish. Lifestyle   Check your blood glucose regularly.  Exercise at least 30 minutes 5 or more days each week, or as told by your health care provider.  Take medicines as told by your health care provider.  Do not use any products that contain nicotine or tobacco, such as cigarettes and e-cigarettes. If you need help quitting, ask your health care provider.  Work with a counselor or diabetes educator to identify strategies to manage stress and any emotional and social challenges. What are some questions to ask my health care provider?  Do I need to meet with a diabetes educator?  Do I need to meet with a dietitian?  What number can I call if I have questions?  When are the best times to check my blood glucose? Where to find more information:  American Diabetes Association: diabetes.org/food-and-fitness/food  Academy of Nutrition and Dietetics:  www.eatright.org/resources/health/diseases-and-conditions/diabetes  National Institute of Diabetes and Digestive and Kidney Diseases (NIH): www.niddk.nih.gov/health-information/diabetes/overview/diet-eating-physical-activity Summary  A healthy meal plan will help you control your blood glucose and maintain a healthy lifestyle.  Working with a diet and nutrition specialist (dietitian) can help you make a meal plan that is best for you.  Keep in mind that carbohydrates and alcohol have immediate effects on your blood glucose levels. It is important to count carbohydrates and to use alcohol carefully. This information is not intended to replace advice given to you by your health care provider. Make sure you discuss any questions you have with your health care provider. Document Released: 08/25/2005 Document Revised: 01/02/2017 Document Reviewed: 01/02/2017 Elsevier Interactive Patient Education  2018 Elsevier Inc.  

## 2018-01-19 NOTE — Progress Notes (Signed)
Subjective:  Patient ID: Jeffrey [name], male    DOB: [birth year]  Age: 52 y.o. MRN: [id number]  CC: Hospitalization Follow-up   HPI Jeffrey [name] is a 52 year old male with a history of type 2 diabetes mellitus (A1c 13.8 ), diabetic neuropathy, status post right great and second toe amputation , hypertension, Nonischemic cardiomyopathy, CHF (EF 20 % from 2-D echo 01/2018) who presents today for a follow-up visit after hospitalization at [facility] from 11/11/18 through 01/16/18.   He had presented to [facility] ED after syncopal episode.  CT head revealed no acute intracranial abnormalities, chest x-ray revealed stable cardiomegaly with mild diffuse pulmonary vascular congestion without frank pulmonary edema.   Echocardiogram revealed EF of 20%, diffuse hypokinesis, grade 2 diastolic dysfunction.  He was seen by EP and subsequently underwent placement of a single lead pacer/AICD. Subsequently discharged to follow-up with cardiology and EP.  He presents today accompanied by his mother and complains of slight pain at the site of ICD placement. His blood pressure is slightly elevated and he admits to taking his medications a few minutes ago. His blood sugar in the clinic is 86 and he was given graham crackers but he informs me at home he has not had any hypoglycemia as his fasting sugars are usually in the 100s. He has no other acute concerns today.  Past Medical History:  Diagnosis Date  . CAD (coronary artery disease)    a. cath 01/31/17: 60% 1st RPLB, 60% dist RCA, 55% prox RCA, 10% pro LAD --> Rx TX.   Marland Kitchen Chronic systolic CHF (congestive heart failure) ([place]) 01/28/2017   1. Echo 01/29/17:  EF 20-25, normal wall motion, mild LAE // 2. EF 10-15 by [facility] 01/2017   . Diabetes mellitus   . Diabetic foot infection ([place]) 03/2016   RT FOOT  . HTN (hypertension)   . Hyperlipidemia   . NICM (nonischemic cardiomyopathy) ([place]) 02/15/2017   1. Mod non-obs CAD on LHC in 01/2017 - CAD does not explain  cardiomyopathy    Past Surgical History:  Procedure Laterality Date  . AMPUTATION Right 04/01/2016   Procedure: Right Great Toe Amputation;  Surgeon: Newt Minion, MD;  Location: [place];  Service: Orthopedics;  Laterality: Right;  . AMPUTATION Right 06/19/2016   Procedure: AMPUTATION SECOND TOE;  Surgeon: Marybelle Killings, MD;  Location: [place];  Service: Orthopedics;  Laterality: Right;  . AMPUTATION TOE Right   . BACK SURGERY     for abscess  . ICD IMPLANT N/A 01/15/2018   Procedure: ICD IMPLANT;  Surgeon: Deboraha Sprang, MD;  Location: [place] [facility];  Service: Cardiovascular;  Laterality: N/A;  . RIGHT/LEFT HEART CATH AND CORONARY ANGIOGRAPHY N/A 01/31/2017   Procedure: Right/Left Heart Cath and Coronary Angiography;  Surgeon: Troy Sine, MD;  Location: [place] [facility];  Service: Cardiovascular;  Laterality: N/A;    No Known Allergies   Outpatient Medications Prior to Visit  Medication Sig Dispense Refill  . acetaminophen (TYLENOL) 500 MG tablet Take 1,000 mg by mouth every 6 (six) hours as needed for headache (pain).    Marland Kitchen albuterol (PROVENTIL HFA;VENTOLIN HFA) 108 (90 Base) MCG/ACT inhaler Inhale 1-2 puffs into the lungs every 6 (six) hours as needed for wheezing or shortness of breath. 1 Inhaler 0  . aspirin EC 81 MG EC tablet Take 1 tablet (81 mg total) by mouth daily. 30 tablet 0  . Blood Glucose Monitoring Suppl (TRUE METRIX METER) DEVI 1 each by  Does not apply route 3 (three) times daily before meals. 1 Device 0  . carvedilol (COREG) 6.25 MG tablet Take 1 tablet (6.25 mg total) by mouth 2 (two) times daily with a meal. 60 tablet 3  . cyclobenzaprine (FLEXERIL) 5 MG tablet Take 1 tablet (5 mg total) at bedtime by mouth. (Patient taking differently: Take 5 mg by mouth at bedtime as needed for muscle spasms. ) 10 tablet 0  . ferrous sulfate 325 (65 FE) MG tablet Take 1 tablet (325 mg total) by mouth 2 (two) times daily with a meal. (Patient taking differently: Take 325 mg by  mouth daily with breakfast. ) 60 tablet 3  . furosemide (LASIX) 40 MG tablet Take 1 tablet (40 mg total) by mouth daily. 30 tablet 3  . gabapentin (NEURONTIN) 400 MG capsule Take 1 capsule (400 mg total) by mouth 3 (three) times daily. 90 capsule 3  . glucose blood (TRUE METRIX BLOOD GLUCOSE TEST) test strip Use 3 times daily before meals 100 each 12  . insulin aspart (NOVOLOG) 100 UNIT/ML injection Inject 0-15 Units into the skin 3 (three) times daily with meals. Sliding scale  CBG 70 - 120: 0 units: CBG 121 - 140: 2 units; CBG 140 - 200: 4 units; CBG 201 - 240: 6 units; CBG 240 - 300: 8 units;CBG 300 - 350: 12 units; CBG 351 - 400: 16 units; CBG > 400 : 16 units and notify your  MD 10 mL 3  . Insulin Glargine (LANTUS SOLOSTAR) 100 UNIT/ML Solostar Pen Inject 65 Units into the skin daily at 10 pm. 5 pen 3  . potassium chloride (K-DUR) 10 MEQ tablet Take 1 tablet (10 mEq total) by mouth daily. 30 tablet 3  . rosuvastatin (CRESTOR) 40 MG tablet Take 1 tablet (40 mg total) by mouth daily. 30 tablet 3  . sacubitril-valsartan (ENTRESTO) 24-26 MG Take 1 tablet by mouth 2 (two) times daily. 60 tablet 3  . spironolactone (ALDACTONE) 25 MG tablet Take 1 tablet (25 mg total) by mouth daily. 30 tablet 0  . TRUEPLUS LANCETS 28G MISC 1 each by Does not apply route 3 (three) times daily before meals. 100 each 12   No facility-administered medications prior to visit.     ROS Review of Systems  Constitutional: Negative for activity change and appetite change.  HENT: Negative for sinus pressure and sore throat.   Eyes: Negative for visual disturbance.  Respiratory: Negative for cough, chest tightness and shortness of breath.   Cardiovascular: Negative for chest pain and leg swelling.  Gastrointestinal: Negative for abdominal distention, abdominal pain, constipation and diarrhea.  Endocrine: Negative.   Genitourinary: Negative for dysuria.  Musculoskeletal: Negative for joint swelling and myalgias.  Skin:  Negative for rash.  Allergic/Immunologic: Negative.   Neurological: Negative for weakness, light-headedness and numbness.  Psychiatric/Behavioral: Negative for dysphoric mood and suicidal ideas.    Objective:  BP (!) 154/95   Pulse 90   Temp 98.4 F (36.9 C) (Oral)   Ht 5\' 7"  (1.702 m)   Wt 200 lb 12.8 oz (91.1 kg)   PF 98 L/min   BMI 31.45 kg/m   BP/Weight 01/19/2018 05/15/4033 06/14/2594  Systolic BP 638 756 433  Diastolic BP 95 91 295  Wt. (Lbs) 200.8 196.2 195.6  BMI 31.45 30.73 30.64      Physical Exam  Constitutional: He is oriented to person, place, and time. He appears well-developed and well-nourished.  Cardiovascular: Normal rate, normal heart sounds and intact distal pulses.  No murmur heard. Pulmonary/Chest: Effort normal and breath sounds normal. He has no wheezes. He has no rales. He exhibits no tenderness.  ICD in left upper chest wall  Abdominal: Soft. Bowel sounds are normal. He exhibits no distension and no mass. There is no tenderness.  Musculoskeletal: Normal range of motion.  Neurological: He is alert and oriented to person, place, and time.  Skin: Skin is warm and dry.  Psychiatric: He has a normal mood and affect.     Lab Results  Component Value Date   HGBA1C 13.8 12/20/2017    Assessment & Plan:   1. Uncontrolled type 2 diabetes mellitus with hyperglycemia, with long-term current use of insulin (HCC) Controlled with A1c of 13.8 Blood sugar log reveals improvement No regimen change today Counseled on Diabetic diet, my plate method, 650 minutes of moderate intensity exercise/week Keep blood sugar logs with fasting goals of 80-120 mg/dl, random of less than 180 and in the event of sugars less than 60 mg/dl or greater than 400 mg/dl please notify the clinic ASAP. It is recommended that you undergo annual eye exams and annual foot exams. Pneumonia vaccine is recommended. - POCT glucose (manual entry)  2. Essential hypertension Uncontrolled He  took his antihypertensive a few minutes ago No regimen change at this time Counseled on blood pressure goal of less than 130/80, low-sodium, DASH diet, medication compliance, 150 minutes of moderate intensity exercise per week. Discussed medication compliance, adverse effects.   3. S/P ICD (internal cardiac defibrillator) procedure Doing well Keep appointment for wound check with cardiology  4. NICM (nonischemic cardiomyopathy) (HCC) EF 20%, diffuse hypokinesis from echocardiogram of 01/2018 Status post ICD Risk factor modification  5. Hyperlipidemia, unspecified hyperlipidemia type Continue statin Low-cholesterol diet   No orders of the defined types were placed in this encounter.   Follow-up: Return for Follow-up of chronic medical conditions, keep previously scheduled appointment.   Charlott Rakes MD

## 2018-01-20 ENCOUNTER — Encounter: Payer: Self-pay | Admitting: Family Medicine

## 2018-01-29 ENCOUNTER — Ambulatory Visit (INDEPENDENT_AMBULATORY_CARE_PROVIDER_SITE_OTHER): Payer: Self-pay | Admitting: *Deleted

## 2018-01-29 DIAGNOSIS — I428 Other cardiomyopathies: Secondary | ICD-10-CM

## 2018-01-29 LAB — CUP PACEART INCLINIC DEVICE CHECK
Battery Remaining Longevity: 133 mo
Battery Voltage: 3.05 V
Date Time Interrogation Session: 20190218113042
HIGH POWER IMPEDANCE MEASURED VALUE: 49 Ohm
Implantable Lead Implant Date: 20190204
Implantable Pulse Generator Implant Date: 20190204
Lead Channel Impedance Value: 342 Ohm
Lead Channel Pacing Threshold Amplitude: 0.75 V
Lead Channel Setting Pacing Amplitude: 3.5 V
Lead Channel Setting Pacing Pulse Width: 0.4 ms
Lead Channel Setting Sensing Sensitivity: 0.3 mV
MDC IDC LEAD LOCATION: 753860
MDC IDC MSMT LEADCHNL RV IMPEDANCE VALUE: 399 Ohm
MDC IDC MSMT LEADCHNL RV PACING THRESHOLD PULSEWIDTH: 0.4 ms
MDC IDC MSMT LEADCHNL RV SENSING INTR AMPL: 11.375 mV
MDC IDC MSMT LEADCHNL RV SENSING INTR AMPL: 15.125 mV
MDC IDC STAT BRADY RV PERCENT PACED: 0.01 %

## 2018-01-29 NOTE — Progress Notes (Signed)
Wound check appointment. Dermabond removed . Wound without redness or edema. Right lateral edge of incision site unapproximated, steri-strips applied. Normal device function. Thresholds, sensing, and impedances consistent with implant measurements. Device programmed at 3.5V for extra safety margin until 3 month visit. Histogram distribution appropriate for patient and level of activity. No ventricular arrhythmias noted. Patient educated about wound care, arm mobility, lifting restrictions, shock plan. ROV 02/01/2018 for wound re-check.

## 2018-02-01 ENCOUNTER — Ambulatory Visit (INDEPENDENT_AMBULATORY_CARE_PROVIDER_SITE_OTHER): Payer: Self-pay | Admitting: *Deleted

## 2018-02-01 DIAGNOSIS — I428 Other cardiomyopathies: Secondary | ICD-10-CM

## 2018-02-01 DIAGNOSIS — B999 Unspecified infectious disease: Secondary | ICD-10-CM

## 2018-02-01 MED ORDER — DOXYCYCLINE HYCLATE 100 MG PO TABS: 100.0000 mg | ORAL_TABLET | Freq: Two times a day (BID) | ORAL | Status: AC

## 2018-02-01 NOTE — Progress Notes (Signed)
Wound re-check appointment. Upon removal of steri-strips small amount of yellow purulent drainage was expelled through a pin-hole on right lateral edge of incision. Dr. Caryl Comes assessed site, no further drainage was able to be expressed. Dr. Caryl Comes ordered doxycycline 100 mg tablets Q12 for 7 days, advised patient to use warm compresses twice a day, and to schedule wound re-check in one week. Wound re-check 02/07/18.

## 2018-02-07 ENCOUNTER — Ambulatory Visit: Payer: Self-pay | Admitting: *Deleted

## 2018-02-07 DIAGNOSIS — I428 Other cardiomyopathies: Secondary | ICD-10-CM

## 2018-02-07 NOTE — Progress Notes (Signed)
Seen for f/u wound check from 02/01/18. No drainage noted, stitch visible removed by Dr. Caryl Comes, very small amount of serosangious fluid noted, no signs of infection, per SK antibiotic ointment applied and covered with band-aid.  Of note pt did not take the doxycycline, pt stated that they went to pharmacy and it was not there. F/U as scheduled with SK on 04/12/2018. Pt instructed to call if he develops fever or chills. Pt voiced understanding

## 2018-02-15 MED FILL — CARVEDILOL 6.25 MG TABLET: 6.25 | 30 days supply | Qty: 60 | Fill #1

## 2018-02-15 MED FILL — GABAPENTIN 400 MG CAPSULE: 400 | 30 days supply | Qty: 90 | Fill #1

## 2018-02-15 MED FILL — POTASSIUM CL 10 MEQ TAB SA: 10 | 30 days supply | Qty: 30 | Fill #1

## 2018-03-08 MED FILL — ?HUMALOG 100 UNITS/ML VIAL: 100 | 22 days supply | Qty: 10 | Fill #1

## 2018-03-12 MED FILL — GABAPENTIN 400 MG CAPSULE: 400 | 30 days supply | Qty: 90 | Fill #2

## 2018-03-12 MED FILL — POTASSIUM CL 10 MEQ TAB SA: 10 | 30 days supply | Qty: 30 | Fill #2

## 2018-03-12 MED FILL — CARVEDILOL 6.25 MG TABLET: 6.25 | 30 days supply | Qty: 60 | Fill #2

## 2018-03-16 MED FILL — $LANTUS SOLOSTAR 100 UNITS/: 100 | 23 days supply | Qty: 15 | Fill #1

## 2018-03-21 ENCOUNTER — Encounter: Payer: Self-pay | Admitting: Family Medicine

## 2018-03-21 ENCOUNTER — Ambulatory Visit: Payer: Self-pay | Attending: Family Medicine | Admitting: Family Medicine

## 2018-03-21 VITALS — BP 145/99 | HR 79 | Temp 98.3°F | Ht 67.0 in | Wt 196.4 lb

## 2018-03-21 DIAGNOSIS — Z95810 Presence of automatic (implantable) cardiac defibrillator: Secondary | ICD-10-CM

## 2018-03-21 DIAGNOSIS — E1165 Type 2 diabetes mellitus with hyperglycemia: Secondary | ICD-10-CM

## 2018-03-21 DIAGNOSIS — I5022 Chronic systolic (congestive) heart failure: Secondary | ICD-10-CM

## 2018-03-21 DIAGNOSIS — Z9889 Other specified postprocedural states: Secondary | ICD-10-CM | POA: Insufficient documentation

## 2018-03-21 DIAGNOSIS — Z79899 Other long term (current) drug therapy: Secondary | ICD-10-CM | POA: Insufficient documentation

## 2018-03-21 DIAGNOSIS — Z833 Family history of diabetes mellitus: Secondary | ICD-10-CM | POA: Insufficient documentation

## 2018-03-21 DIAGNOSIS — R0602 Shortness of breath: Secondary | ICD-10-CM | POA: Insufficient documentation

## 2018-03-21 DIAGNOSIS — I429 Cardiomyopathy, unspecified: Secondary | ICD-10-CM | POA: Insufficient documentation

## 2018-03-21 DIAGNOSIS — E1142 Type 2 diabetes mellitus with diabetic polyneuropathy: Secondary | ICD-10-CM

## 2018-03-21 DIAGNOSIS — Z89411 Acquired absence of right great toe: Secondary | ICD-10-CM | POA: Insufficient documentation

## 2018-03-21 DIAGNOSIS — E785 Hyperlipidemia, unspecified: Secondary | ICD-10-CM | POA: Insufficient documentation

## 2018-03-21 DIAGNOSIS — Z794 Long term (current) use of insulin: Secondary | ICD-10-CM | POA: Insufficient documentation

## 2018-03-21 DIAGNOSIS — I10 Essential (primary) hypertension: Secondary | ICD-10-CM

## 2018-03-21 DIAGNOSIS — I110 Hypertensive heart disease with heart failure: Secondary | ICD-10-CM | POA: Insufficient documentation

## 2018-03-21 DIAGNOSIS — I2510 Atherosclerotic heart disease of native coronary artery without angina pectoris: Secondary | ICD-10-CM | POA: Insufficient documentation

## 2018-03-21 DIAGNOSIS — Z89421 Acquired absence of other right toe(s): Secondary | ICD-10-CM | POA: Insufficient documentation

## 2018-03-21 DIAGNOSIS — Z8249 Family history of ischemic heart disease and other diseases of the circulatory system: Secondary | ICD-10-CM | POA: Insufficient documentation

## 2018-03-21 DIAGNOSIS — Z7982 Long term (current) use of aspirin: Secondary | ICD-10-CM | POA: Insufficient documentation

## 2018-03-21 LAB — POCT GLYCOSYLATED HEMOGLOBIN (HGB A1C): Hemoglobin A1C: 13.2

## 2018-03-21 LAB — GLUCOSE, POCT (MANUAL RESULT ENTRY): POC Glucose: 376 mg/dl — AB (ref 70–99)

## 2018-03-21 MED ORDER — ROSUVASTATIN CALCIUM 40 MG PO TABS: 40.0000 mg | ORAL_TABLET | Freq: Every day | ORAL | 3 refills | Status: DC

## 2018-03-21 MED ORDER — CARVEDILOL 6.25 MG PO TABS: 6.2500 mg | ORAL_TABLET | Freq: Two times a day (BID) | ORAL | 3 refills | Status: DC

## 2018-03-21 MED ORDER — SPIRONOLACTONE 25 MG PO TABS: 25.0000 mg | ORAL_TABLET | Freq: Every day | ORAL | 3 refills | Status: DC

## 2018-03-21 MED ORDER — SACUBITRIL-VALSARTAN 24-26 MG PO TABS: 1.0000 | ORAL_TABLET | Freq: Two times a day (BID) | ORAL | 3 refills | Status: DC

## 2018-03-21 MED ORDER — INSULIN GLARGINE 100 UNIT/ML SOLOSTAR PEN: 40.0000 [IU] | PEN_INJECTOR | Freq: Two times a day (BID) | SUBCUTANEOUS | 3 refills | Status: DC

## 2018-03-21 MED ORDER — GABAPENTIN 400 MG PO CAPS: 400.0000 mg | ORAL_CAPSULE | Freq: Three times a day (TID) | ORAL | 3 refills | Status: DC

## 2018-03-21 MED ORDER — FUROSEMIDE 40 MG PO TABS: 40.0000 mg | ORAL_TABLET | Freq: Every day | ORAL | 3 refills | Status: DC

## 2018-03-21 NOTE — Progress Notes (Signed)
Subjective:  Patient ID: Jeffrey [name], male    DOB: 1966/08/03  Age: 52 y.o. MRN: [id number]  CC: Diabetes   HPI Jeffrey [name] is a 52 year old male with a history of type 2 diabetes mellitus (A1c 13.2 ), diabetic neuropathy, status post right great and second toe amputation , hypertension, Nonischemic cardiomyopathy, CHF (EF 20 % from 2-D echo 01/2018 status post ICD placement in 01/2018) who presents today for a follow-up visit    He presents today accompanied by his who states the patient indulges in late night snacking significant other but has been compliant with his medications.  He has neuropathy in his feet which is controlled on gabapentin and denies visual concerns. Doing well on his antihypertensives and his blood pressure is slightly elevated. He does have shortness of breath on mild exertion but denies pedal edema or weight gain He denies chest pains and has no additional concerns today.  Past Medical History:  Diagnosis Date  . CAD (coronary artery disease)    a. cath 01/31/17: 60% 1st RPLB, 60% dist RCA, 55% prox RCA, 10% pro LAD --> Rx TX.   Marland Kitchen Chronic systolic CHF (congestive heart failure) ([place]) 01/28/2017   1. Echo 01/29/17:  EF 20-25, normal wall motion, mild LAE // 2. EF 10-15 by [facility] 01/2017   . Diabetes mellitus   . Diabetic foot infection ([place]) 03/2016   RT FOOT  . HTN (hypertension)   . Hyperlipidemia   . NICM (nonischemic cardiomyopathy) ([place]) 02/15/2017   1. Mod non-obs CAD on LHC in 01/2017 - CAD does not explain cardiomyopathy    Past Surgical History:  Procedure Laterality Date  . AMPUTATION Right 04/01/2016   Procedure: Right Great Toe Amputation;  Surgeon: Newt Minion, MD;  Location: [place];  Service: Orthopedics;  Laterality: Right;  . AMPUTATION Right 06/19/2016   Procedure: AMPUTATION SECOND TOE;  Surgeon: Marybelle Killings, MD;  Location: [place];  Service: Orthopedics;  Laterality: Right;  . AMPUTATION TOE Right   . BACK SURGERY     for abscess  . ICD  IMPLANT N/A 01/15/2018   Procedure: ICD IMPLANT;  Surgeon: Deboraha Sprang, MD;  Location: [place] [facility];  Service: Cardiovascular;  Laterality: N/A;  . RIGHT/LEFT HEART CATH AND CORONARY ANGIOGRAPHY N/A 01/31/2017   Procedure: Right/Left Heart Cath and Coronary Angiography;  Surgeon: Troy Sine, MD;  Location: [place] [facility];  Service: Cardiovascular;  Laterality: N/A;    Family History  Problem Relation Age of Onset  . Diabetes Mother   . Hypertension Mother   . Diabetes Father   . Heart attack Father   . Diabetes Sister   . Heart attack Maternal Grandmother     No Known Allergies   Outpatient Medications Prior to Visit  Medication Sig Dispense Refill  . acetaminophen (TYLENOL) 500 MG tablet Take 1,000 mg by mouth every 6 (six) hours as needed for headache (pain).    Marland Kitchen albuterol (PROVENTIL HFA;VENTOLIN HFA) 108 (90 Base) MCG/ACT inhaler Inhale 1-2 puffs into the lungs every 6 (six) hours as needed for wheezing or shortness of breath. 1 Inhaler 0  . aspirin EC 81 MG EC tablet Take 1 tablet (81 mg total) by mouth daily. 30 tablet 0  . Blood Glucose Monitoring Suppl (TRUE METRIX METER) DEVI 1 each by Does not apply route 3 (three) times daily before meals. 1 Device 0  . cyclobenzaprine (FLEXERIL) 5 MG tablet Take 1 tablet (5 mg total) at bedtime  by mouth. (Patient taking differently: Take 5 mg by mouth at bedtime as needed for muscle spasms. ) 10 tablet 0  . ferrous sulfate 325 (65 FE) MG tablet Take 1 tablet (325 mg total) by mouth 2 (two) times daily with a meal. (Patient taking differently: Take 325 mg by mouth daily with breakfast. ) 60 tablet 3  . glucose blood (TRUE METRIX BLOOD GLUCOSE TEST) test strip Use 3 times daily before meals 100 each 12  . insulin aspart (NOVOLOG) 100 UNIT/ML injection Inject 0-15 Units into the skin 3 (three) times daily with meals. Sliding scale  CBG 70 - 120: 0 units: CBG 121 - 140: 2 units; CBG 140 - 200: 4 units; CBG 201 - 240: 6 units; CBG  240 - 300: 8 units;CBG 300 - 350: 12 units; CBG 351 - 400: 16 units; CBG > 400 : 16 units and notify your  MD 10 mL 3  . TRUEPLUS LANCETS 28G MISC 1 each by Does not apply route 3 (three) times daily before meals. 100 each 12  . carvedilol (COREG) 6.25 MG tablet Take 1 tablet (6.25 mg total) by mouth 2 (two) times daily with a meal. 60 tablet 3  . gabapentin (NEURONTIN) 400 MG capsule Take 1 capsule (400 mg total) by mouth 3 (three) times daily. 90 capsule 3  . Insulin Glargine (LANTUS SOLOSTAR) 100 UNIT/ML Solostar Pen Inject 65 Units into the skin daily at 10 pm. 5 pen 3  . rosuvastatin (CRESTOR) 40 MG tablet Take 1 tablet (40 mg total) by mouth daily. 30 tablet 3  . sacubitril-valsartan (ENTRESTO) 24-26 MG Take 1 tablet by mouth 2 (two) times daily. 60 tablet 3  . spironolactone (ALDACTONE) 25 MG tablet Take 1 tablet (25 mg total) by mouth daily. 30 tablet 0  . potassium chloride (K-DUR) 10 MEQ tablet Take 1 tablet (10 mEq total) by mouth daily. 30 tablet 3  . furosemide (LASIX) 40 MG tablet Take 1 tablet (40 mg total) by mouth daily. 30 tablet 3   No facility-administered medications prior to visit.     ROS Review of Systems  Constitutional: Negative for activity change and appetite change.  HENT: Negative for sinus pressure and sore throat.   Eyes: Negative for visual disturbance.  Respiratory: Negative for cough, chest tightness and shortness of breath.   Cardiovascular: Negative for chest pain and leg swelling.  Gastrointestinal: Negative for abdominal distention, abdominal pain, constipation and diarrhea.  Endocrine: Negative.   Genitourinary: Negative for dysuria.  Musculoskeletal: Negative for joint swelling and myalgias.  Skin: Negative for rash.  Allergic/Immunologic: Negative.   Neurological: Positive for numbness. Negative for weakness and light-headedness.  Psychiatric/Behavioral: Negative for dysphoric mood and suicidal ideas.    Objective:  BP (!) 145/99   Pulse 79    Temp 98.3 F (36.8 C) (Oral)   Ht 5' 7" (1.702 m)   Wt 196 lb 6.4 oz (89.1 kg)   SpO2 100%   BMI 30.76 kg/m   BP/Weight 03/21/2018 02/10/9517 07/16/1659  Systolic BP 630 160 109  Diastolic BP 99 95 91  Wt. (Lbs) 196.4 200.8 196.2  BMI 30.76 31.45 30.73      Physical Exam  Constitutional: He is oriented to person, place, and time. He appears well-developed and well-nourished.  Cardiovascular: Normal rate, normal heart sounds and intact distal pulses.  No murmur heard. Pulmonary/Chest: Effort normal and breath sounds normal. He has no wheezes. He has no rales. He exhibits no tenderness.  Abdominal: Soft. Bowel sounds  are normal. He exhibits no distension and no mass. There is no tenderness.  Musculoskeletal:  Amputation of right great and second toe  Neurological: He is alert and oriented to person, place, and time.  Skin: Skin is warm and dry.  Psychiatric: He has a normal mood and affect.    Lab Results  Component Value Date   HGBA1C 13.2 03/21/2018    Assessment & Plan:   1. Uncontrolled type 2 diabetes mellitus with hyperglycemia, with long-term current use of insulin (HCC) Uncontrolled with A1c of 13.2 due to dietary indiscretion Increased dose of Lantus Diabetic diet, emphasized the need to be compliant - POCT glucose (manual entry) - POCT glycosylated hemoglobin (Hb A1C) - Ambulatory referral to Ophthalmology - CMP14+EGFR - Insulin Glargine (LANTUS SOLOSTAR) 100 UNIT/ML Solostar Pen; Inject 40 Units into the skin 2 (two) times daily.  Dispense: 5 pen; Refill: 3  2. Essential hypertension Slightly elevated No regimen changes Low sodium, DASH diet - carvedilol (COREG) 6.25 MG tablet; Take 1 tablet (6.25 mg total) by mouth 2 (two) times daily with a meal.  Dispense: 60 tablet; Refill: 3  3. Chronic systolic CHF (congestive heart failure) (HCC) EF of 20%, NYHA III Euvolemic, - furosemide (LASIX) 40 MG tablet; Take 1 tablet (40 mg total) by mouth daily.  Dispense:  30 tablet; Refill: 3 - sacubitril-valsartan (ENTRESTO) 24-26 MG; Take 1 tablet by mouth 2 (two) times daily.  Dispense: 60 tablet; Refill: 3  4. Diabetic polyneuropathy associated with type 2 diabetes mellitus (HCC) Stable - gabapentin (NEURONTIN) 400 MG capsule; Take 1 capsule (400 mg total) by mouth 3 (three) times daily.  Dispense: 90 capsule; Refill: 3  5. S/P ICD (internal cardiac defibrillator) procedure Advised to follow-up with cardiology   Meds ordered this encounter  Medications  . carvedilol (COREG) 6.25 MG tablet    Sig: Take 1 tablet (6.25 mg total) by mouth 2 (two) times daily with a meal.    Dispense:  60 tablet    Refill:  3  . furosemide (LASIX) 40 MG tablet    Sig: Take 1 tablet (40 mg total) by mouth daily.    Dispense:  30 tablet    Refill:  3  . gabapentin (NEURONTIN) 400 MG capsule    Sig: Take 1 capsule (400 mg total) by mouth 3 (three) times daily.    Dispense:  90 capsule    Refill:  3  . Insulin Glargine (LANTUS SOLOSTAR) 100 UNIT/ML Solostar Pen    Sig: Inject 40 Units into the skin 2 (two) times daily.    Dispense:  5 pen    Refill:  3    Discontinue previous dose  . rosuvastatin (CRESTOR) 40 MG tablet    Sig: Take 1 tablet (40 mg total) by mouth daily.    Dispense:  30 tablet    Refill:  3  . sacubitril-valsartan (ENTRESTO) 24-26 MG    Sig: Take 1 tablet by mouth 2 (two) times daily.    Dispense:  60 tablet    Refill:  3  . spironolactone (ALDACTONE) 25 MG tablet    Sig: Take 1 tablet (25 mg total) by mouth daily.    Dispense:  30 tablet    Refill:  3    Follow-up: No follow-ups on file.   Charlott Rakes MD

## 2018-03-21 NOTE — Progress Notes (Signed)
Refill on Spironolactone.

## 2018-03-22 LAB — CMP14+EGFR
ALBUMIN: 3.3 g/dL — AB (ref 3.5–5.5)
ALT: 14 IU/L (ref 0–44)
AST: 11 IU/L (ref 0–40)
Albumin/Globulin Ratio: 1 — ABNORMAL LOW (ref 1.2–2.2)
Alkaline Phosphatase: 94 IU/L (ref 39–117)
BUN / CREAT RATIO: 12 (ref 9–20)
BUN: 12 mg/dL (ref 6–24)
Bilirubin Total: 0.2 mg/dL (ref 0.0–1.2)
CO2: 23 mmol/L (ref 20–29)
CREATININE: 0.99 mg/dL (ref 0.76–1.27)
Calcium: 8.8 mg/dL (ref 8.7–10.2)
Chloride: 98 mmol/L (ref 96–106)
GFR, EST AFRICAN AMERICAN: 100 mL/min/{1.73_m2} (ref >59)
GFR, EST NON AFRICAN AMERICAN: 87 mL/min/{1.73_m2} (ref >59)
GLUCOSE: 399 mg/dL — AB (ref 65–99)
Globulin, Total: 3.4 g/dL (ref 1.5–4.5)
Potassium: 4.3 mmol/L (ref 3.5–5.2)
Sodium: 136 mmol/L (ref 134–144)
TOTAL PROTEIN: 6.7 g/dL (ref 6.0–8.5)

## 2018-03-23 ENCOUNTER — Encounter: Payer: Self-pay | Admitting: Family Medicine

## 2018-03-29 ENCOUNTER — Telehealth: Payer: Self-pay

## 2018-03-29 NOTE — Telephone Encounter (Signed)
Patient was called and informed of lab results. 

## 2018-04-12 ENCOUNTER — Encounter: Payer: Self-pay | Admitting: Internal Medicine

## 2018-04-12 ENCOUNTER — Ambulatory Visit (INDEPENDENT_AMBULATORY_CARE_PROVIDER_SITE_OTHER): Payer: Self-pay | Admitting: Internal Medicine

## 2018-04-12 VITALS — BP 160/90 | HR 88 | Ht 67.0 in | Wt 196.0 lb

## 2018-04-12 DIAGNOSIS — I10 Essential (primary) hypertension: Secondary | ICD-10-CM

## 2018-04-12 DIAGNOSIS — I5023 Acute on chronic systolic (congestive) heart failure: Secondary | ICD-10-CM

## 2018-04-12 DIAGNOSIS — Z95810 Presence of automatic (implantable) cardiac defibrillator: Secondary | ICD-10-CM

## 2018-04-12 DIAGNOSIS — I428 Other cardiomyopathies: Secondary | ICD-10-CM

## 2018-04-12 MED ORDER — SACUBITRIL-VALSARTAN 49-51 MG PO TABS: 1.0000 | ORAL_TABLET | Freq: Two times a day (BID) | ORAL | 11 refills | Status: DC

## 2018-04-12 MED ORDER — CARVEDILOL 6.25 MG PO TABS: 12.5000 mg | ORAL_TABLET | Freq: Two times a day (BID) | ORAL | 3 refills | Status: DC

## 2018-04-12 MED FILL — **ENTRESTO 49-51 MG TABLET: 49-51 | 14 days supply | Qty: 28 | Fill #0

## 2018-04-12 MED FILL — SPIRONOLACTONE 25 MG TABLET: 25 | 30 days supply | Qty: 30 | Fill #0

## 2018-04-12 NOTE — Progress Notes (Signed)
Patient Care Team: Charlott Rakes, MD as PCP - General (Family Medicine) Nahser, Wonda Cheng, MD as PCP - Cardiology (Cardiology)   HPI  Jeffrey [name] is a 52 y.o. male Seen in follow-up for an ICD implanted 2/19 for syncope in the setting of nonischemic cardiomyopathy.  He has concomitant nonobstructive coronary disease.  DATE TEST EF   2/18 LHC 10-15% CAD nonobstructive  2/18 Echo   20 %   2/19 Echo   20 % LAE//RV systolic dysfn        Date Cr K Hgb  4/19 0.99 4.3 13.0         He continues some shortness of breath.  No edema.  No chest pain. Compliant with medications  Past Medical History:  Diagnosis Date  . CAD (coronary artery disease)    a. cath 01/31/17: 60% 1st RPLB, 60% dist RCA, 55% prox RCA, 10% pro LAD --> Rx TX.   Marland Kitchen Chronic systolic CHF (congestive heart failure) ([place]) 01/28/2017   1. Echo 01/29/17:  EF 20-25, normal wall motion, mild LAE // 2. EF 10-15 by [facility] 01/2017   . Diabetes mellitus   . Diabetic foot infection ([place]) 03/2016   RT FOOT  . HTN (hypertension)   . Hyperlipidemia   . NICM (nonischemic cardiomyopathy) ([place]) 02/15/2017   1. Mod non-obs CAD on LHC in 01/2017 - CAD does not explain cardiomyopathy    Past Surgical History:  Procedure Laterality Date  . AMPUTATION Right 04/01/2016   Procedure: Right Great Toe Amputation;  Surgeon: Newt Minion, MD;  Location: [place];  Service: Orthopedics;  Laterality: Right;  . AMPUTATION Right 06/19/2016   Procedure: AMPUTATION SECOND TOE;  Surgeon: Marybelle Killings, MD;  Location: [place];  Service: Orthopedics;  Laterality: Right;  . AMPUTATION TOE Right   . BACK SURGERY     for abscess  . ICD IMPLANT N/A 01/15/2018   Procedure: ICD IMPLANT;  Surgeon: Deboraha Sprang, MD;  Location: [place] [facility];  Service: Cardiovascular;  Laterality: N/A;  . RIGHT/LEFT HEART CATH AND CORONARY ANGIOGRAPHY N/A 01/31/2017   Procedure: Right/Left Heart Cath and Coronary Angiography;  Surgeon: Troy Sine, MD;  Location: [place] [facility];  Service: Cardiovascular;  Laterality: N/A;    Current Meds  Medication Sig  . acetaminophen (TYLENOL) 500 MG tablet Take 1,000 mg by mouth every 6 (six) hours as needed for headache (pain).  Marland Kitchen albuterol (PROVENTIL HFA;VENTOLIN HFA) 108 (90 Base) MCG/ACT inhaler Inhale 1-2 puffs into the lungs every 6 (six) hours as needed for wheezing or shortness of breath.  Marland Kitchen aspirin EC 81 MG EC tablet Take 1 tablet (81 mg total) by mouth daily.  . Blood Glucose Monitoring Suppl (TRUE METRIX METER) DEVI 1 each by Does not apply route 3 (three) times daily before meals.  . carvedilol (COREG) 6.25 MG tablet Take 1 tablet (6.25 mg total) by mouth 2 (two) times daily with a meal.  . cyclobenzaprine (FLEXERIL) 5 MG tablet Take 1 tablet (5 mg total) at bedtime by mouth. (Patient taking differently: Take 5 mg by mouth at bedtime as needed for muscle spasms. )  . ferrous sulfate 325 (65 FE) MG tablet Take 1 tablet (325 mg total) by mouth 2 (two) times daily with a meal. (Patient taking differently: Take 325 mg by mouth daily with breakfast. )  . furosemide (LASIX) 40 MG tablet Take 1 tablet (40 mg total) by mouth daily.  Marland Kitchen gabapentin (NEURONTIN)  400 MG capsule Take 1 capsule (400 mg total) by mouth 3 (three) times daily.  Marland Kitchen glucose blood (TRUE METRIX BLOOD GLUCOSE TEST) test strip Use 3 times daily before meals  . insulin aspart (NOVOLOG) 100 UNIT/ML injection Inject 0-15 Units into the skin 3 (three) times daily with meals. Sliding scale  CBG 70 - 120: 0 units: CBG 121 - 140: 2 units; CBG 140 - 200: 4 units; CBG 201 - 240: 6 units; CBG 240 - 300: 8 units;CBG 300 - 350: 12 units; CBG 351 - 400: 16 units; CBG > 400 : 16 units and notify your  MD  . Insulin Glargine (LANTUS SOLOSTAR) 100 UNIT/ML Solostar Pen Inject 40 Units into the skin 2 (two) times daily.  . rosuvastatin (CRESTOR) 40 MG tablet Take 1 tablet (40 mg total) by mouth daily.  . sacubitril-valsartan (ENTRESTO) 24-26 MG Take 1 tablet by mouth  2 (two) times daily.  Marland Kitchen spironolactone (ALDACTONE) 25 MG tablet Take 1 tablet (25 mg total) by mouth daily.  . TRUEPLUS LANCETS 28G MISC 1 each by Does not apply route 3 (three) times daily before meals.    No Known Allergies    Review of Systems negative except from HPI and PMH  Physical Exam BP (!) 160/90   Pulse 88   Ht 5\' 7"  (1.702 m)   Wt 196 lb (88.9 kg)   SpO2 97%   BMI 30.70 kg/m  Well developed and well nourished in no acute distress HENT normal E scleral and icterus clear Neck Supple JVP flat; carotids brisk and full Clear to ausculation Device pocket well healed; without hematoma or erythema.  There is no tethering  Regular rate and rhythm, no murmurs gallops or rub Soft with active bowel sounds No clubbing cyanosis  Edema Alert and oriented, grossly normal motor and sensory function Skin Warm and Dry  ECG demonstrates sinus rhythm at 88 Intervals 18/09/38 Axis left -37     Assessment and  Plan Syncope  NICM  ICD - [employer]    CHF chronic systolic  HTN   DM   No syncope  Euvolemic continue current meds  BP poorly controlled  Will increase carvedilol 6.25>>12.5 bid And entresto 24/26>>49/51  Will need to check BMET in 2 weeks   Will arrange closer follow-up and drug uptitration           Current medicines are reviewed at length with the patient today .  The patient does not  have concerns regarding medicines.

## 2018-04-12 NOTE — Patient Instructions (Addendum)
Medication Instructions:  Your physician has recommended you make the following change in your medication:   1. Begin Entresto 49/51, one tab in the AM, one tab in the PM 2. Increase your Coreg (carvedilol) to 12.5mg , two tablets, two times per day.  Labwork: Your physician recommends that you return for lab work on May 15th  Testing/Procedures: None ordered.  Follow-Up: Your physician wants you to follow-up in: 9 months with Dr Caryl Comes. You will receive a reminder letter in the mail two months in advance. If you don't receive a letter, please call our office to schedule the follow-up appointment.  Remote monitoring is used to monitor your Pacemaker of ICD from home. This monitoring reduces the number of office visits required to check your device to one time per year. It allows [place] to keep an eye on the functioning of your device to ensure it is working properly. You are scheduled for a device check from home on 07/12/2018. You may send your transmission at any time that day. If you have a wireless device, the transmission will be sent automatically. After your physician reviews your transmission, you will receive a postcard with your next transmission date.    Any Other Special Instructions Will Be Listed Below (If Applicable).     If you need a refill on your cardiac medications before your next appointment, please call your pharmacy.

## 2018-04-25 ENCOUNTER — Other Ambulatory Visit: Payer: Self-pay

## 2018-05-23 ENCOUNTER — Other Ambulatory Visit: Payer: Self-pay | Admitting: Family Medicine

## 2018-05-23 DIAGNOSIS — R059 Cough, unspecified: Secondary | ICD-10-CM

## 2018-05-23 DIAGNOSIS — R05 Cough: Secondary | ICD-10-CM

## 2018-05-23 MED FILL — FUROSEMIDE 40 MG TAB: 40 | 30 days supply | Qty: 30 | Fill #1

## 2018-05-23 MED FILL — POTASSIUM CL 10 MEQ TAB SA: 10 | 30 days supply | Qty: 30 | Fill #3

## 2018-05-23 MED FILL — SPIRONOLACTONE 25 MG TABLET: 25 | 30 days supply | Qty: 30 | Fill #1

## 2018-05-23 MED FILL — **ENTRESTO 49-51 MG TABLET: 49-51 | 14 days supply | Qty: 28 | Fill #1

## 2018-06-20 ENCOUNTER — Ambulatory Visit: Payer: Medicaid Other | Admitting: Family Medicine

## 2018-06-21 ENCOUNTER — Other Ambulatory Visit: Payer: Self-pay | Admitting: *Deleted

## 2018-06-22 ENCOUNTER — Encounter: Payer: Self-pay | Admitting: Family Medicine

## 2018-06-22 ENCOUNTER — Encounter (HOSPITAL_COMMUNITY): Payer: Self-pay | Admitting: Emergency Medicine

## 2018-06-22 ENCOUNTER — Emergency Department (HOSPITAL_COMMUNITY)
Admission: EM | Admit: 2018-06-22 | Discharge: 2018-06-22 | Disposition: A | Discharge status: Home / Self Care | Payer: Medicaid Other | Attending: Emergency Medicine | Admitting: Emergency Medicine

## 2018-06-22 ENCOUNTER — Other Ambulatory Visit: Payer: Self-pay

## 2018-06-22 ENCOUNTER — Emergency Department (HOSPITAL_COMMUNITY): Payer: Medicaid Other

## 2018-06-22 ENCOUNTER — Ambulatory Visit: Payer: Medicaid Other | Attending: Family Medicine | Admitting: Family Medicine

## 2018-06-22 VITALS — BP 135/92 | HR 90 | Temp 98.7°F | Resp 16 | Wt 190.0 lb

## 2018-06-22 DIAGNOSIS — Z79899 Other long term (current) drug therapy: Secondary | ICD-10-CM | POA: Diagnosis not present

## 2018-06-22 DIAGNOSIS — E1142 Type 2 diabetes mellitus with diabetic polyneuropathy: Secondary | ICD-10-CM | POA: Insufficient documentation

## 2018-06-22 DIAGNOSIS — R739 Hyperglycemia, unspecified: Secondary | ICD-10-CM

## 2018-06-22 DIAGNOSIS — I5022 Chronic systolic (congestive) heart failure: Secondary | ICD-10-CM | POA: Insufficient documentation

## 2018-06-22 DIAGNOSIS — I2510 Atherosclerotic heart disease of native coronary artery without angina pectoris: Secondary | ICD-10-CM | POA: Diagnosis not present

## 2018-06-22 DIAGNOSIS — I110 Hypertensive heart disease with heart failure: Secondary | ICD-10-CM | POA: Insufficient documentation

## 2018-06-22 DIAGNOSIS — Z89411 Acquired absence of right great toe: Secondary | ICD-10-CM | POA: Diagnosis not present

## 2018-06-22 DIAGNOSIS — R05 Cough: Secondary | ICD-10-CM | POA: Insufficient documentation

## 2018-06-22 DIAGNOSIS — Z89421 Acquired absence of other right toe(s): Secondary | ICD-10-CM | POA: Diagnosis not present

## 2018-06-22 DIAGNOSIS — I10 Essential (primary) hypertension: Secondary | ICD-10-CM | POA: Diagnosis not present

## 2018-06-22 DIAGNOSIS — Z7982 Long term (current) use of aspirin: Secondary | ICD-10-CM | POA: Diagnosis not present

## 2018-06-22 DIAGNOSIS — E1165 Type 2 diabetes mellitus with hyperglycemia: Secondary | ICD-10-CM | POA: Insufficient documentation

## 2018-06-22 DIAGNOSIS — I428 Other cardiomyopathies: Secondary | ICD-10-CM | POA: Insufficient documentation

## 2018-06-22 DIAGNOSIS — R531 Weakness: Secondary | ICD-10-CM | POA: Diagnosis not present

## 2018-06-22 DIAGNOSIS — E785 Hyperlipidemia, unspecified: Secondary | ICD-10-CM | POA: Diagnosis not present

## 2018-06-22 DIAGNOSIS — R059 Cough, unspecified: Secondary | ICD-10-CM

## 2018-06-22 DIAGNOSIS — Z95810 Presence of automatic (implantable) cardiac defibrillator: Secondary | ICD-10-CM | POA: Insufficient documentation

## 2018-06-22 DIAGNOSIS — Z794 Long term (current) use of insulin: Secondary | ICD-10-CM | POA: Diagnosis not present

## 2018-06-22 LAB — BASIC METABOLIC PANEL
ANION GAP: 10 (ref 5–15)
BUN: 18 mg/dL (ref 6–20)
CALCIUM: 9.7 mg/dL (ref 8.9–10.3)
CO2: 23 mmol/L (ref 22–32)
CREATININE: 1.01 mg/dL (ref 0.61–1.24)
Chloride: 102 mmol/L (ref 98–111)
Glucose, Bld: 414 mg/dL — ABNORMAL HIGH (ref 70–99)
Potassium: 4 mmol/L (ref 3.5–5.1)
Sodium: 135 mmol/L (ref 135–145)

## 2018-06-22 LAB — URINALYSIS, ROUTINE W REFLEX MICROSCOPIC
BILIRUBIN URINE: NEGATIVE
Ketones, ur: NEGATIVE mg/dL
NITRITE: NEGATIVE
Protein, ur: 100 mg/dL — AB
SPECIFIC GRAVITY, URINE: 1.028 (ref 1.005–1.030)
pH: 5 (ref 5.0–8.0)

## 2018-06-22 LAB — CBG MONITORING, ED
GLUCOSE-CAPILLARY: 398 mg/dL — AB (ref 70–99)
Glucose-Capillary: 388 mg/dL — ABNORMAL HIGH (ref 70–99)
Glucose-Capillary: 398 mg/dL — ABNORMAL HIGH (ref 70–99)
Glucose-Capillary: 347 mg/dL — ABNORMAL HIGH (ref 70–99)

## 2018-06-22 LAB — I-STAT TROPONIN, ED: Troponin i, poc: 0 ng/mL (ref 0.00–0.08)

## 2018-06-22 LAB — CBC
HCT: 42.1 % (ref 39.0–52.0)
HEMOGLOBIN: 13 g/dL (ref 13.0–17.0)
MCH: 24.3 pg — AB (ref 26.0–34.0)
MCHC: 30.9 g/dL (ref 30.0–36.0)
MCV: 78.7 fL (ref 78.0–100.0)
Platelets: 270 10*3/uL (ref 150–400)
RBC: 5.35 MIL/uL (ref 4.22–5.81)
RDW: 13.8 % (ref 11.5–15.5)
WBC: 5.3 10*3/uL (ref 4.0–10.5)

## 2018-06-22 MED ORDER — ROSUVASTATIN CALCIUM 40 MG PO TABS: 40.0000 mg | ORAL_TABLET | Freq: Every day | ORAL | 3 refills | Status: DC

## 2018-06-22 MED ORDER — POTASSIUM CHLORIDE ER 10 MEQ PO TBCR: 10.0000 meq | EXTENDED_RELEASE_TABLET | Freq: Every day | ORAL | 3 refills | Status: DC

## 2018-06-22 MED ORDER — INSULIN ASPART 100 UNIT/ML ~~LOC~~ SOLN
10.0000 [IU] | Freq: Once | SUBCUTANEOUS | Status: AC
  Administered 2018-06-22: 10 [IU] via SUBCUTANEOUS
  Filled 2018-06-22: qty 1

## 2018-06-22 MED ORDER — SPIRONOLACTONE 25 MG PO TABS: 25.0000 mg | ORAL_TABLET | Freq: Every day | ORAL | 3 refills | Status: DC

## 2018-06-22 MED ORDER — GABAPENTIN 400 MG PO CAPS: 400.0000 mg | ORAL_CAPSULE | Freq: Three times a day (TID) | ORAL | 3 refills | Status: DC

## 2018-06-22 MED ORDER — BENZONATATE 100 MG PO CAPS: 100.0000 mg | ORAL_CAPSULE | Freq: Two times a day (BID) | ORAL | 0 refills | Status: DC | PRN

## 2018-06-22 MED ORDER — FUROSEMIDE 40 MG PO TABS: 40.0000 mg | ORAL_TABLET | Freq: Every day | ORAL | 3 refills | Status: DC

## 2018-06-22 MED FILL — POTASSIUM CL 10 MEQ TAB SA: 10 | 30 days supply | Qty: 30 | Fill #0

## 2018-06-22 MED FILL — ROSUVASTATIN CALCIUM 40 MG: 40 | 30 days supply | Qty: 30 | Fill #0

## 2018-06-22 MED FILL — SPIRONOLACTONE 25 MG TABLET: 25 | 30 days supply | Qty: 30 | Fill #0

## 2018-06-22 MED FILL — FUROSEMIDE 40 MG TAB: 40 | 30 days supply | Qty: 30 | Fill #0

## 2018-06-22 MED FILL — GABAPENTIN 400 MG CAPSULE: 400 | 30 days supply | Qty: 90 | Fill #0

## 2018-06-22 NOTE — ED Notes (Signed)
Pt CBG 388 in triage, RN notified.

## 2018-06-22 NOTE — Progress Notes (Signed)
Spoke to patient wife on the phone for medication list; Needs refill on cough medication:tessalon pearl

## 2018-06-22 NOTE — Progress Notes (Signed)
Subjective:  Patient ID: Jeffrey [name], male    DOB: March 08, 1966  Age: 52 y.o. MRN: [id number]  CC: Follow-up   HPI Jeffrey [name] is a 52 year old male with a history of type 2 diabetes mellitus (A1c 13.2 ), diabetic neuropathy, status post right great and second toe amputation , hypertension, Nonischemic cardiomyopathy, CHF (EF 20 % from 2-D echo 01/2018 status post ICD placement in 01/2018) who presents today for a follow-up visit. His wife called during the encounter to inform me the patient is yet to take any medications this morning but has had a cough for the last 1 week and is requesting a refill of Tessalon Perles.  He denies rhinorrhea, congestion, sinus pressure, fever or myalgias. He endorses compliance with his insulins and informs me his blood sugars at home have been in the mid 100s.  He is working on his diet and has cut up-to-date night snacking.  Neuropathy is controlled on gabapentin and he denies hypoglycemia. With regards to his congestive heart failure he denies shortness of breath, pedal edema, chest pain and was last seen by Dr. Caryl Comes in 04/2018 at which time the dose of his Entresto and carvedilol had been increased due to elevated blood pressure.  The plan was for a repeat comprehensive metabolic panel in 2 weeks which the patient never went for. He endorses compliance with his other medications and denies adverse effects.  Past Medical History:  Diagnosis Date  . CAD (coronary artery disease)    a. cath 01/31/17: 60% 1st RPLB, 60% dist RCA, 55% prox RCA, 10% pro LAD --> Rx TX.   Marland Kitchen Chronic systolic CHF (congestive heart failure) ([place]) 01/28/2017   1. Echo 01/29/17:  EF 20-25, normal wall motion, mild LAE // 2. EF 10-15 by [facility] 01/2017   . Diabetes mellitus   . Diabetic foot infection ([place]) 03/2016   RT FOOT  . HTN (hypertension)   . Hyperlipidemia   . NICM (nonischemic cardiomyopathy) ([place]) 02/15/2017   1. Mod non-obs CAD on LHC in 01/2017 - CAD does not explain  cardiomyopathy    Past Surgical History:  Procedure Laterality Date  . AMPUTATION Right 04/01/2016   Procedure: Right Great Toe Amputation;  Surgeon: Newt Minion, MD;  Location: [place];  Service: Orthopedics;  Laterality: Right;  . AMPUTATION Right 06/19/2016   Procedure: AMPUTATION SECOND TOE;  Surgeon: Marybelle Killings, MD;  Location: [place];  Service: Orthopedics;  Laterality: Right;  . AMPUTATION TOE Right   . BACK SURGERY     for abscess  . ICD IMPLANT N/A 01/15/2018   Procedure: ICD IMPLANT;  Surgeon: Deboraha Sprang, MD;  Location: [place] [facility];  Service: Cardiovascular;  Laterality: N/A;  . RIGHT/LEFT HEART CATH AND CORONARY ANGIOGRAPHY N/A 01/31/2017   Procedure: Right/Left Heart Cath and Coronary Angiography;  Surgeon: Troy Sine, MD;  Location: [place] [facility];  Service: Cardiovascular;  Laterality: N/A;    No Known Allergies   Outpatient Medications Prior to Visit  Medication Sig Dispense Refill  . albuterol (PROVENTIL HFA;VENTOLIN HFA) 108 (90 Base) MCG/ACT inhaler Inhale 1-2 puffs into the lungs every 6 (six) hours as needed for wheezing or shortness of breath. 1 Inhaler 0  . aspirin EC 81 MG EC tablet Take 1 tablet (81 mg total) by mouth daily. 30 tablet 0  . carvedilol (COREG) 6.25 MG tablet Take 2 tablets (12.5 mg total) by mouth 2 (two) times daily with a meal. 60 tablet 3  .  cyclobenzaprine (FLEXERIL) 5 MG tablet Take 1 tablet (5 mg total) at bedtime by mouth. (Patient taking differently: Take 5 mg by mouth at bedtime as needed for muscle spasms. ) 10 tablet 0  . ferrous sulfate 325 (65 FE) MG tablet Take 1 tablet (325 mg total) by mouth 2 (two) times daily with a meal. (Patient taking differently: Take 325 mg by mouth daily with breakfast. ) 60 tablet 3  . insulin aspart (NOVOLOG) 100 UNIT/ML injection Inject 0-15 Units into the skin 3 (three) times daily with meals. Sliding scale  CBG 70 - 120: 0 units: CBG 121 - 140: 2 units; CBG 140 - 200: 4 units; CBG 201 - 240:  6 units; CBG 240 - 300: 8 units;CBG 300 - 350: 12 units; CBG 351 - 400: 16 units; CBG > 400 : 16 units and notify your  MD 10 mL 3  . Insulin Glargine (LANTUS SOLOSTAR) 100 UNIT/ML Solostar Pen Inject 40 Units into the skin 2 (two) times daily. 5 pen 3  . sacubitril-valsartan (ENTRESTO) 49-51 MG Take 1 tablet by mouth 2 (two) times daily. 60 tablet 11  . furosemide (LASIX) 40 MG tablet Take 1 tablet (40 mg total) by mouth daily. 30 tablet 3  . gabapentin (NEURONTIN) 400 MG capsule Take 1 capsule (400 mg total) by mouth 3 (three) times daily. 90 capsule 3  . spironolactone (ALDACTONE) 25 MG tablet Take 1 tablet (25 mg total) by mouth daily. 30 tablet 3  . acetaminophen (TYLENOL) 500 MG tablet Take 1,000 mg by mouth every 6 (six) hours as needed for headache (pain).    . Blood Glucose Monitoring Suppl (TRUE METRIX METER) DEVI 1 each by Does not apply route 3 (three) times daily before meals. 1 Device 0  . glucose blood (TRUE METRIX BLOOD GLUCOSE TEST) test strip Use 3 times daily before meals 100 each 12  . TRUEPLUS LANCETS 28G MISC 1 each by Does not apply route 3 (three) times daily before meals. 100 each 12  . potassium chloride (K-DUR) 10 MEQ tablet Take 1 tablet (10 mEq total) by mouth daily. 30 tablet 3  . rosuvastatin (CRESTOR) 40 MG tablet Take 1 tablet (40 mg total) by mouth daily. (Patient not taking: Reported on 06/22/2018) 30 tablet 3   No facility-administered medications prior to visit.     ROS Review of Systems  Constitutional: Negative for activity change and appetite change.  HENT: Negative for sinus pressure and sore throat.   Eyes: Negative for visual disturbance.  Respiratory: Positive for cough. Negative for chest tightness and shortness of breath.   Cardiovascular: Negative for chest pain and leg swelling.  Gastrointestinal: Negative for abdominal distention, abdominal pain, constipation and diarrhea.  Endocrine: Negative.   Genitourinary: Negative for dysuria.    Musculoskeletal: Negative for joint swelling and myalgias.  Skin: Negative for rash.  Allergic/Immunologic: Negative.   Neurological: Negative for weakness, light-headedness and numbness.  Psychiatric/Behavioral: Negative for dysphoric mood and suicidal ideas.    Objective:  BP (!) 135/92 (BP Location: Left Arm, Patient Position: Sitting, Cuff Size: Normal)   Pulse 90   Temp 98.7 F (37.1 C) (Oral)   Resp 16   Wt 190 lb (86.2 kg)   SpO2 99%   BMI 29.76 kg/m   BP/Weight 06/22/2018 0/96/2836 05/14/9475  Systolic BP 546 503 546  Diastolic BP 91 92 90  Wt. (Lbs) - 190 196  BMI - 29.76 30.7      Physical Exam  Constitutional: He is oriented  to person, place, and time. He appears well-developed and well-nourished.  HENT:  Right Ear: External ear normal.  Left Ear: External ear normal.  Mouth/Throat: Oropharynx is clear and moist.  No sinus tenderness  Neck: No JVD present.  Cardiovascular: Normal rate, normal heart sounds and intact distal pulses.  No murmur heard. Pulmonary/Chest: Effort normal and breath sounds normal. He has no wheezes. He has no rales. He exhibits no tenderness.  Abdominal: Soft. Bowel sounds are normal. He exhibits no distension and no mass. There is no tenderness.  Musculoskeletal: Normal range of motion.  Neurological: He is alert and oriented to person, place, and time.  Skin: Skin is warm and dry.  Psychiatric: He has a normal mood and affect.     CMP Latest Ref Rng & Units 03/21/2018 01/16/2018 01/15/2018  Glucose 65 - 99 mg/dL 399(H) 114(H) 310(H)  BUN 6 - 24 mg/dL _0 Creatinine 0.76 - 1.27 mg/dL 0.99 0.95 1.07  Sodium 134 - 144 mmol/L 136 139 136  Potassium 3.5 - 5.2 mmol/L 4.3 3.4(L) 3.9  Chloride 96 - 106 mmol/L 98 105 107  CO2 20 - 29 mmol/L 23 22 21(L)  Calcium 8.7 - 10.2 mg/dL 8.8 8.7(L) 8.6(L)  Total Protein 6.0 - 8.5 g/dL 6.7 - 6.1(L)  Total Bilirubin 0.0 - 1.2 mg/dL 0.2 - 0.6  Alkaline Phos 39 - 117 IU/L 94 - 68  AST 0 - 40  IU/L 11 - 15  ALT 0 - 44 IU/L 14 - 15(L)    Lab Results  Component Value Date   HGBA1C 13.2 03/21/2018    Assessment & Plan:   1. Chronic systolic CHF (congestive heart failure) (HCC) EF 20% from 01/2018; status post ICD Euvolemic Fluid restriction of 2 L/day, continue Lasix, Entresto, beta-blocker We will check labs given increase in dose of Entresto by EP - potassium chloride (K-DUR) 10 MEQ tablet; Take 1 tablet (10 mEq total) by mouth daily.  Dispense: 30 tablet; Refill: 3 - furosemide (LASIX) 40 MG tablet; Take 1 tablet (40 mg total) by mouth daily.  Dispense: 30 tablet; Refill: 3  2. Diabetic polyneuropathy associated with type 2 diabetes mellitus ([place]) Uncontrolled diabetes with A1c of 13.2 We will send of hemoglobin A1c and adjust his insulin regimen accordingly Diabetic neuropathy stable on gabapentin Counseled on Diabetic diet, my plate method, 335 minutes of moderate intensity exercise/week Keep blood sugar logs with fasting goals of 80-120 mg/dl, random of less than 180 and in the event of sugars less than 60 mg/dl or greater than 400 mg/dl please notify the clinic ASAP. It is recommended that you undergo annual eye exams and annual foot exams. Pneumonia vaccine is recommended. - Hemoglobin A1c - gabapentin (NEURONTIN) 400 MG capsule; Take 1 capsule (400 mg total) by mouth 3 (three) times daily.  Dispense: 90 capsule; Refill: 3  3. Cough No indication for antibiotic at this time Placed on Tessalon Perles  4. Essential hypertension Slight diastolic elevation No regimen change as he is yet to take his medications this morning Counseled on blood pressure goal of less than 130/80, low-sodium, DASH diet, medication compliance, 150 minutes of moderate intensity exercise per week. Discussed medication compliance, adverse effects. - CMP14+EGFR  5. S/P ICD (internal cardiac defibrillator) procedure Stable Last seen by Dr Caryl Comes in 04/2018    Healthcare maintenance -  declines tetanus shot  Meds ordered this encounter  Medications  . benzonatate (TESSALON) 100 MG capsule    Sig: Take 1 capsule (100 mg  total) by mouth 2 (two) times daily as needed for cough.    Dispense:  20 capsule    Refill:  0  . spironolactone (ALDACTONE) 25 MG tablet    Sig: Take 1 tablet (25 mg total) by mouth daily.    Dispense:  30 tablet    Refill:  3  . rosuvastatin (CRESTOR) 40 MG tablet    Sig: Take 1 tablet (40 mg total) by mouth daily.    Dispense:  30 tablet    Refill:  3  . potassium chloride (K-DUR) 10 MEQ tablet    Sig: Take 1 tablet (10 mEq total) by mouth daily.    Dispense:  30 tablet    Refill:  3  . gabapentin (NEURONTIN) 400 MG capsule    Sig: Take 1 capsule (400 mg total) by mouth 3 (three) times daily.    Dispense:  90 capsule    Refill:  3  . furosemide (LASIX) 40 MG tablet    Sig: Take 1 tablet (40 mg total) by mouth daily.    Dispense:  30 tablet    Refill:  3    Follow-up: Return in about 3 months (around 09/22/2018) for Follow-up of chronic medical conditions.   Charlott Rakes MD

## 2018-06-22 NOTE — Discharge Instructions (Addendum)
Take your medications as prescribed by your doctor.  Eat healthy.  Follow-up as needed.

## 2018-06-22 NOTE — ED Notes (Signed)
Pt also endorses left upper chest pain and left shoulder/arm pain.

## 2018-06-22 NOTE — ED Triage Notes (Signed)
Pt to ER for evaluation of nonproductive cough and feeling as if his sugars are running high - states polyuria & blurred vision. Pt ambulatory. In NAD.

## 2018-06-22 NOTE — ED Notes (Signed)
Patient verbalizes understanding of discharge instructions. Opportunity for questioning and answers were provided. 

## 2018-06-22 NOTE — ED Provider Notes (Signed)
[facility] EMERGENCY DEPARTMENT Provider Note   CSN: [id number] Arrival date & time: 06/22/18  1206     History   Chief Complaint Chief Complaint  Patient presents with  . Hyperglycemia    HPI Jeffrey [name] is a 51 y.o. male.  HPI Jeffrey [name] is a 52 y.o. male with history of CHF, last EF 20%, coronary artery disease, diabetes, hypertension, has implanted ICD, presents to emergency department with generalized malaise and blood sugar being high.  Patient states he started feeling bad yesterday.  He cannot really tell me any specific symptoms but states he just does not feel good.  He states he that he has had some dry nonproductive cough for several weeks but that is not new.  He had a regular appointment with his primary care doctor where he mentioned all his symptoms, he was evaluated, and was discharged home on refills on his medications.  He states he was still not feeling well so he came here.  Patient states he has not taken any of his medicines this morning including his insulin.  He states he had a hotdog for lunch.  He states generally he is not eating very healthy.  He denies any recent illnesses.  No fever or chills.  No chest pain.  He does have mild shortness of breath but attributes it to his cough.  He denies any swelling in extremities.  No urinary symptoms.  Past Medical History:  Diagnosis Date  . CAD (coronary artery disease)    a. cath 01/31/17: 60% 1st RPLB, 60% dist RCA, 55% prox RCA, 10% pro LAD --> Rx TX.   Marland Kitchen Chronic systolic CHF (congestive heart failure) ([place]) 01/28/2017   1. Echo 01/29/17:  EF 20-25, normal wall motion, mild LAE // 2. EF 10-15 by [facility] 01/2017   . Diabetes mellitus   . Diabetic foot infection ([place]) 03/2016   RT FOOT  . HTN (hypertension)   . Hyperlipidemia   . NICM (nonischemic cardiomyopathy) ([place]) 02/15/2017   1. Mod non-obs CAD on LHC in 01/2017 - CAD does not explain cardiomyopathy    Patient Active Problem List   Diagnosis Date Noted  . S/P ICD (internal cardiac defibrillator) procedure 01/19/2018  . Cardiac device in situ   . Syncope 01/13/2018  . NICM (nonischemic cardiomyopathy) ([place]) 02/15/2017  . Coronary artery disease involving native coronary artery of native heart without angina pectoris 02/15/2017  . Hyperlipidemia 02/15/2017  . Chronic systolic heart failure ([place]) 01/28/2017  . Folliculitis 82/95/6213  . 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 ([place]) 08/65/7846  . Anemia, iron deficiency   . Noncompliance with medication regimen 03/30/2016  . Uncontrolled type 2 diabetes mellitus with hyperglycemia, with long-term current use of insulin ([place])   . Cellulitis 09/25/2015  . Essential hypertension 08/21/2013  . Diabetic neuropathy ([place]) 08/21/2013  . Fungal toenail infection 08/21/2013    Past Surgical History:  Procedure Laterality Date  . AMPUTATION Right 04/01/2016   Procedure: Right Great Toe Amputation;  Surgeon: Newt Minion, MD;  Location: [place];  Service: Orthopedics;  Laterality: Right;  . AMPUTATION Right 06/19/2016   Procedure: AMPUTATION SECOND TOE;  Surgeon: Marybelle Killings, MD;  Location: [place];  Service: Orthopedics;  Laterality: Right;  . AMPUTATION TOE Right   . BACK SURGERY     for abscess  . ICD IMPLANT N/A 01/15/2018   Procedure: ICD IMPLANT;  Surgeon: Deboraha Sprang, MD;  Location: [place] [facility];  Service: Cardiovascular;  Laterality: N/A;  . RIGHT/LEFT HEART CATH AND CORONARY ANGIOGRAPHY N/A 01/31/2017   Procedure: Right/Left Heart Cath and Coronary Angiography;  Surgeon: Troy Sine, MD;  Location: [place] [facility];  Service: Cardiovascular;  Laterality: N/A;        Home Medications    Prior to Admission medications   Medication Sig Start Date End Date Taking? Authorizing Provider  acetaminophen (TYLENOL) 500 MG tablet Take 1,000 mg by mouth every 6 (six) hours as needed for headache (pain).    [provider]  albuterol  (PROVENTIL HFA;VENTOLIN HFA) 108 (90 Base) MCG/ACT inhaler Inhale 1-2 puffs into the lungs every 6 (six) hours as needed for wheezing or shortness of breath. 11/15/16   Recardo Evangelist, PA-C  aspirin EC 81 MG EC tablet Take 1 tablet (81 mg total) by mouth daily. 01/16/18   Sheikh, Omair Latif, DO  benzonatate (TESSALON) 100 MG capsule Take 1 capsule (100 mg total) by mouth 2 (two) times daily as needed for cough. 06/22/18   Charlott Rakes, MD  Blood Glucose Monitoring Suppl (TRUE METRIX METER) DEVI 1 each by Does not apply route 3 (three) times daily before meals. 02/15/17   Charlott Rakes, MD  carvedilol (COREG) 6.25 MG tablet Take 2 tablets (12.5 mg total) by mouth 2 (two) times daily with a meal. 04/12/18   Deboraha Sprang, MD  cyclobenzaprine (FLEXERIL) 5 MG tablet Take 1 tablet (5 mg total) at bedtime by mouth. Patient taking differently: Take 5 mg by mouth at bedtime as needed for muscle spasms.  10/21/17   Caccavale, Sophia, PA-C  ferrous sulfate 325 (65 FE) MG tablet Take 1 tablet (325 mg total) by mouth 2 (two) times daily with a meal. Patient taking differently: Take 325 mg by mouth daily with breakfast.  04/03/16   Dhungel, Nishant, MD  furosemide (LASIX) 40 MG tablet Take 1 tablet (40 mg total) by mouth daily. 06/22/18 09/20/18  Charlott Rakes, MD  gabapentin (NEURONTIN) 400 MG capsule Take 1 capsule (400 mg total) by mouth 3 (three) times daily. 06/22/18   Charlott Rakes, MD  glucose blood (TRUE METRIX BLOOD GLUCOSE TEST) test strip Use 3 times daily before meals 02/15/17   Charlott Rakes, MD  insulin aspart (NOVOLOG) 100 UNIT/ML injection Inject 0-15 Units into the skin 3 (three) times daily with meals. Sliding scale  CBG 70 - 120: 0 units: CBG 121 - 140: 2 units; CBG 140 - 200: 4 units; CBG 201 - 240: 6 units; CBG 240 - 300: 8 units;CBG 300 - 350: 12 units; CBG 351 - 400: 16 units; CBG > 400 : 16 units and notify your  MD 12/20/17   Charlott Rakes, MD  Insulin Glargine (LANTUS SOLOSTAR) 100  UNIT/ML Solostar Pen Inject 40 Units into the skin 2 (two) times daily. 03/21/18   Charlott Rakes, MD  potassium chloride (K-DUR) 10 MEQ tablet Take 1 tablet (10 mEq total) by mouth daily. 06/22/18 09/20/18  Charlott Rakes, MD  rosuvastatin (CRESTOR) 40 MG tablet Take 1 tablet (40 mg total) by mouth daily. 06/22/18   Charlott Rakes, MD  sacubitril-valsartan (ENTRESTO) 49-51 MG Take 1 tablet by mouth 2 (two) times daily. 04/12/18   Deboraha Sprang, MD  spironolactone (ALDACTONE) 25 MG tablet Take 1 tablet (25 mg total) by mouth daily. 06/22/18   Charlott Rakes, MD  TRUEPLUS LANCETS 28G MISC 1 each by Does not apply route 3 (three) times daily before meals. 02/15/17   Charlott Rakes, MD  Family History Family History  Problem Relation Age of Onset  . Diabetes Mother   . Hypertension Mother   . Diabetes Father   . Heart attack Father   . Diabetes Sister   . Heart attack Maternal Grandmother     Social History Social History   Tobacco Use  . Smoking status: Never Smoker  . Smokeless tobacco: Never Used  Substance Use Topics  . Alcohol use: No  . Drug use: No     Allergies   Patient has no known allergies.   Review of Systems Review of Systems  Constitutional: Positive for fatigue. Negative for chills and fever.  Respiratory: Positive for cough. Negative for chest tightness and shortness of breath.   Cardiovascular: Negative for chest pain, palpitations and leg swelling.  Gastrointestinal: Negative for abdominal distention, abdominal pain, diarrhea, nausea and vomiting.  Genitourinary: Negative for dysuria, frequency, hematuria and urgency.  Musculoskeletal: Negative for arthralgias, myalgias, neck pain and neck stiffness.  Skin: Negative for rash.  Allergic/Immunologic: Negative for immunocompromised state.  Neurological: Positive for weakness. Negative for dizziness, light-headedness, numbness and headaches.  All other systems reviewed and are negative.    Physical  Exam Updated Vital Signs BP (!) 133/92 (BP Location: Right Arm)   Pulse 80   Temp 98.5 F (36.9 C) (Oral)   Resp 16   SpO2 99%   Physical Exam  Constitutional: He appears well-developed and well-nourished. No distress.  HENT:  Head: Normocephalic and atraumatic.  Eyes: Conjunctivae are normal.  Neck: Neck supple.  Cardiovascular: Normal rate, regular rhythm and normal heart sounds.  Pulmonary/Chest: Effort normal. No respiratory distress. He has no wheezes. He has no rales.  Abdominal: Soft. Bowel sounds are normal. He exhibits no distension. There is no tenderness. There is no rebound.  Musculoskeletal: He exhibits no edema.  Neurological: He is alert.  Skin: Skin is warm and dry.  Nursing note and vitals reviewed.    ED Treatments / Results  Labs (all labs ordered are listed, but only abnormal results are displayed) Labs Reviewed  BASIC METABOLIC PANEL - Abnormal; Notable for the following components:      Result Value   Glucose, Bld 414 (*)    All other components within normal limits  CBC - Abnormal; Notable for the following components:   MCH 24.3 (*)    All other components within normal limits  URINALYSIS, ROUTINE W REFLEX MICROSCOPIC - Abnormal; Notable for the following components:   Glucose, UA >=500 (*)    Hgb urine dipstick SMALL (*)    Protein, ur 100 (*)    Leukocytes, UA LARGE (*)    Bacteria, UA RARE (*)    All other components within normal limits  CBG MONITORING, ED - Abnormal; Notable for the following components:   Glucose-Capillary 388 (*)    All other components within normal limits  I-STAT TROPONIN, ED    EKG EKG Interpretation  Date/Time:  Friday June 22 2018 12:20:41 EDT Ventricular Rate:  88 PR Interval:  180 QRS Duration: 94 QT Interval:  372 QTC Calculation: 450 R Axis:   -5 Text Interpretation:  Normal sinus rhythm Anterior infarct , age undetermined Abnormal ECG No significant change since last tracing Confirmed by Deno Etienne  [phone]) on 06/22/2018 3:13:40 PM   Radiology Dg Chest 2 View  Result Date: 06/22/2018 CLINICAL DATA:  Nonproductive cough.  Chest pain. EXAM: CHEST - 2 VIEW COMPARISON:  01/16/2018 FINDINGS: Heart is at the upper limits of normal in size. AICD  in place. Pulmonary vascularity is normal. Lungs are clear. No significant bone abnormality. IMPRESSION: No active cardiopulmonary disease. Electronically Signed   By: Lorriane Shire M.D.   On: 06/22/2018 12:45    Procedures Procedures (including critical care time)  Medications Ordered in ED Medications  insulin aspart (novoLOG) injection 10 Units (has no administration in time range)     Initial Impression / Assessment and Plan / ED Course  I have reviewed the triage vital signs and the nursing notes.  Pertinent labs & imaging results that were available during my care of the patient were reviewed by me and considered in my medical decision making (see chart for details).     Patient with generalized weakness.  Glucose here is 414.  Patient was just seen by his doctor, in fact he came straight from doctor's office, because he was still not feeling well.  Patient did not take any of his medications today.  We discussed the importance of taking his medicines as prescribed as as he is supposed to daily.  We also discussed his nutrition.  Patient had a hotdog for lunch.  Due to his CHF, will be careful and given IV fluids, but I will give him some subcu insulin.  His labs otherwise unremarkable.  Normal anion gap.  Negative troponin.  Normal CBC.  I will check orthostatics as well.  5:44 PM Patient is not orthostatic.  He received 10 units of insulin subcutaneously, with a glucose dropping to 347.  Patient sisters at bedside.  [name] apparently is noncompliant with his medications on a daily basis.  Had a long discussion with him about importance of taking his insulin and following good diet.  Patient agreed.  We discussed different food choices.   [name] vital signs are normal at this time other than mild hypertension, he is in no acute distress, ambulatory, stable for discharge home.  Vitals:   06/22/18 1558 06/22/18 1559 06/22/18 1600 06/22/18 1630  BP:    (!) 147/99  Pulse: 84 83 81 73  Resp:      Temp:      TempSrc:      SpO2: 100% 100% 100% 99%     Final Clinical Impressions(s) / ED Diagnoses   Final diagnoses:  Hyperglycemia  Weakness    ED Discharge Orders    None       Jeannett Senior, PA-C 06/22/18 [place], [place], DO 06/22/18 1956

## 2018-06-22 NOTE — ED Notes (Signed)
[facility]- left there came directly here-- had all of issues and complaints addressed there -- prescriptions given - [facility] also spoke with wife, [facility] nurse called this nurse to make ED aware. Also had labs done (CMP and A1C done)

## 2018-06-25 ENCOUNTER — Other Ambulatory Visit: Payer: Self-pay | Admitting: Family Medicine

## 2018-06-25 DIAGNOSIS — E1165 Type 2 diabetes mellitus with hyperglycemia: Secondary | ICD-10-CM

## 2018-06-25 DIAGNOSIS — Z794 Long term (current) use of insulin: Principal | ICD-10-CM

## 2018-06-25 LAB — HEMOGLOBIN A1C: Est. average glucose Bld gHb Est-mCnc: >398 mg/dL

## 2018-06-25 LAB — CMP14+EGFR
A/G RATIO: 1.1 — AB (ref 1.2–2.2)
ALBUMIN: 3.8 g/dL (ref 3.5–5.5)
ALK PHOS: 92 IU/L (ref 39–117)
ALT: 8 IU/L (ref 0–44)
AST: 9 IU/L (ref 0–40)
BILIRUBIN TOTAL: 0.2 mg/dL (ref 0.0–1.2)
BUN / CREAT RATIO: 15 (ref 9–20)
BUN: 18 mg/dL (ref 6–24)
CHLORIDE: 95 mmol/L — AB (ref 96–106)
CO2: 22 mmol/L (ref 20–29)
Calcium: 9.6 mg/dL (ref 8.7–10.2)
Creatinine, Ser: 1.22 mg/dL (ref 0.76–1.27)
GFR calc Af Amer: 78 mL/min/{1.73_m2} (ref >59)
GFR calc non Af Amer: 67 mL/min/{1.73_m2} (ref >59)
GLOBULIN, TOTAL: 3.5 g/dL (ref 1.5–4.5)
GLUCOSE: 425 mg/dL — AB (ref 65–99)
POTASSIUM: 4.2 mmol/L (ref 3.5–5.2)
SODIUM: 131 mmol/L — AB (ref 134–144)
TOTAL PROTEIN: 7.3 g/dL (ref 6.0–8.5)

## 2018-06-25 MED ORDER — INSULIN GLARGINE 100 UNIT/ML SOLOSTAR PEN: 45.0000 [IU] | PEN_INJECTOR | Freq: Two times a day (BID) | SUBCUTANEOUS | 3 refills | Status: DC

## 2018-06-26 ENCOUNTER — Telehealth: Payer: Self-pay | Admitting: *Deleted

## 2018-06-26 NOTE — Telephone Encounter (Signed)
Left a message on the voicemail to return call. Attempt to inform patient of message.   Notes recorded by Charlott Rakes, MD on 06/25/2018 at 5:33 PM EDT Diabetes is uncontrolled with A1c of greater than 15.5; poor compliance is a major contributing factor. I have raised his Lantus to 45 units twice daily and referred him to endocrinology for further management of his diabetes. Please encouraged to comply with a diabetic diet and his medications.

## 2018-06-27 NOTE — Progress Notes (Signed)
Pt name and DOB verified. Spoke to patient's wife about lab results and result note per Dr. Margarita Rana. Verbalized understanding.

## 2018-07-04 MED FILL — LANTUS SOLOSTAR 100 UNITS/M: 100 | 32 days supply | Qty: 30 | Fill #0

## 2018-07-12 ENCOUNTER — Encounter: Payer: Self-pay | Admitting: Cardiology

## 2018-07-12 ENCOUNTER — Ambulatory Visit (INDEPENDENT_AMBULATORY_CARE_PROVIDER_SITE_OTHER): Payer: Medicaid Other | Admitting: *Deleted

## 2018-07-12 DIAGNOSIS — I428 Other cardiomyopathies: Secondary | ICD-10-CM | POA: Diagnosis not present

## 2018-07-12 NOTE — Progress Notes (Signed)
Remote ICD transmission.   

## 2018-07-18 ENCOUNTER — Other Ambulatory Visit: Payer: Self-pay | Admitting: Family Medicine

## 2018-07-18 ENCOUNTER — Telehealth: Payer: Self-pay | Admitting: *Deleted

## 2018-07-18 DIAGNOSIS — R059 Cough, unspecified: Secondary | ICD-10-CM

## 2018-07-18 DIAGNOSIS — R05 Cough: Secondary | ICD-10-CM

## 2018-07-18 MED FILL — CARVEDILOL 6.25 MG TABLET: 6.25 | 30 days supply | Qty: 60 | Fill #3

## 2018-07-18 NOTE — Telephone Encounter (Signed)
Pt requiring PA through [employer] received in [employer]. Please contact Coverm[employer]eds/pharmacy to initiate PA.

## 2018-07-20 ENCOUNTER — Telehealth: Payer: Self-pay

## 2018-07-20 NOTE — Telephone Encounter (Signed)
I called [employer] to do an [place] PA over the phone but was advised that I need to go to [employer] site and print an [place] PA form which I did. I completed the form and placed it in Dr Aquilla Hacker mail bin awaiting his signature.

## 2018-07-25 NOTE — Telephone Encounter (Signed)
**Note De-Identified Horacio Werth Obfuscation** Dr Caryl Comes has signed the pts [facility] PA form and I have faxed it to [facility] as requested per form.

## 2018-08-09 LAB — CUP PACEART REMOTE DEVICE CHECK
Battery Remaining Longevity: 130 mo
Battery Voltage: 3.05 V
Brady Statistic RV Percent Paced: 0.05 %
HIGH POWER IMPEDANCE MEASURED VALUE: 57 Ohm
Lead Channel Impedance Value: 323 Ohm
Lead Channel Impedance Value: 380 Ohm
Lead Channel Setting Pacing Amplitude: 2.5 V
Lead Channel Setting Pacing Pulse Width: 0.4 ms
Lead Channel Setting Sensing Sensitivity: 0.3 mV
MDC IDC LEAD IMPLANT DT: 20190204
MDC IDC LEAD LOCATION: 753860
MDC IDC MSMT LEADCHNL RV PACING THRESHOLD AMPLITUDE: 0.875 V
MDC IDC MSMT LEADCHNL RV PACING THRESHOLD PULSEWIDTH: 0.4 ms
MDC IDC MSMT LEADCHNL RV SENSING INTR AMPL: 12.5 mV
MDC IDC MSMT LEADCHNL RV SENSING INTR AMPL: 12.5 mV
MDC IDC PG IMPLANT DT: 20190204
MDC IDC SESS DTM: 20190801052203

## 2018-09-06 MED FILL — FUROSEMIDE 40 MG TAB: 40 | 30 days supply | Qty: 30 | Fill #1

## 2018-09-06 MED FILL — POTASSIUM CL ER 10 MEQ TAB: 10 | 30 days supply | Qty: 30 | Fill #1

## 2018-09-06 MED FILL — SPIRONOLACTONE 25 MG TABLET: 25 | 30 days supply | Qty: 30 | Fill #1

## 2018-09-06 MED FILL — ROSUVASTATIN CALCIUM 40 MG: 40 | 30 days supply | Qty: 30 | Fill #1

## 2018-09-17 ENCOUNTER — Other Ambulatory Visit: Payer: Self-pay | Admitting: Pharmacist

## 2018-09-17 MED ORDER — INSULIN PEN NEEDLE 32G X 4 MM MISC: 11 refills | Status: DC

## 2018-09-17 MED FILL — POTASSIUM CL ER 10 MEQ TAB: 10 | 30 days supply | Qty: 30 | Fill #1

## 2018-09-17 MED FILL — ROSUVASTATIN CALCIUM 40 MG: 40 | 30 days supply | Qty: 30 | Fill #1

## 2018-09-17 MED FILL — FUROSEMIDE 40 MG TAB: 40 | 30 days supply | Qty: 30 | Fill #2

## 2018-09-17 MED FILL — ENTRESTO 49 MG-51 MG TABLET: 49-51 | 30 days supply | Qty: 60 | Fill #2

## 2018-09-17 MED FILL — LANTUS SOLOSTAR 100 UNITS/M: 100 | 32 days supply | Qty: 30 | Fill #1

## 2018-09-17 MED FILL — TRUEPLUS PEN NDL 32GX5/32: 32G X 4 MM | 30 days supply | Qty: 100 | Fill #0

## 2018-09-17 MED FILL — SPIRONOLACTONE 25 MG TABLET: 25 | 30 days supply | Qty: 30 | Fill #1

## 2018-09-17 MED FILL — TRUEPLUS PEN NDL 32GX5/32": 32G X 4 MM | 30 days supply | Qty: 100 | Fill #0

## 2018-09-17 MED FILL — CARVEDILOL 6.25 MG TABLET: 6.25 | 30 days supply | Qty: 60 | Fill #0

## 2018-09-17 MED FILL — GABAPENTIN 400 MG CAPSULE: 400 | 30 days supply | Qty: 90 | Fill #1

## 2018-10-11 ENCOUNTER — Ambulatory Visit (INDEPENDENT_AMBULATORY_CARE_PROVIDER_SITE_OTHER): Payer: Medicaid Other | Admitting: *Deleted

## 2018-10-11 DIAGNOSIS — I428 Other cardiomyopathies: Secondary | ICD-10-CM

## 2018-10-11 DIAGNOSIS — I5023 Acute on chronic systolic (congestive) heart failure: Secondary | ICD-10-CM

## 2018-10-11 NOTE — Progress Notes (Signed)
Remote ICD transmission.   

## 2018-10-16 ENCOUNTER — Emergency Department (HOSPITAL_COMMUNITY): Payer: Medicaid Other

## 2018-10-16 ENCOUNTER — Other Ambulatory Visit: Payer: Self-pay

## 2018-10-16 ENCOUNTER — Encounter (HOSPITAL_COMMUNITY): Payer: Self-pay

## 2018-10-16 ENCOUNTER — Observation Stay (HOSPITAL_COMMUNITY)
Admission: EM | Admit: 2018-10-16 | Discharge: 2018-10-18 | Disposition: A | Discharge status: Home / Self Care | Payer: Medicaid Other | Attending: Family Medicine | Admitting: Family Medicine

## 2018-10-16 DIAGNOSIS — Z95810 Presence of automatic (implantable) cardiac defibrillator: Secondary | ICD-10-CM | POA: Diagnosis not present

## 2018-10-16 DIAGNOSIS — E162 Hypoglycemia, unspecified: Principal | ICD-10-CM

## 2018-10-16 DIAGNOSIS — Z79899 Other long term (current) drug therapy: Secondary | ICD-10-CM | POA: Diagnosis not present

## 2018-10-16 DIAGNOSIS — I5022 Chronic systolic (congestive) heart failure: Secondary | ICD-10-CM | POA: Diagnosis not present

## 2018-10-16 DIAGNOSIS — E782 Mixed hyperlipidemia: Secondary | ICD-10-CM | POA: Diagnosis present

## 2018-10-16 DIAGNOSIS — E785 Hyperlipidemia, unspecified: Secondary | ICD-10-CM

## 2018-10-16 DIAGNOSIS — Z794 Long term (current) use of insulin: Secondary | ICD-10-CM | POA: Insufficient documentation

## 2018-10-16 DIAGNOSIS — R4182 Altered mental status, unspecified: Secondary | ICD-10-CM | POA: Insufficient documentation

## 2018-10-16 DIAGNOSIS — I10 Essential (primary) hypertension: Secondary | ICD-10-CM

## 2018-10-16 DIAGNOSIS — Z7982 Long term (current) use of aspirin: Secondary | ICD-10-CM | POA: Insufficient documentation

## 2018-10-16 DIAGNOSIS — E1165 Type 2 diabetes mellitus with hyperglycemia: Secondary | ICD-10-CM

## 2018-10-16 DIAGNOSIS — I428 Other cardiomyopathies: Secondary | ICD-10-CM

## 2018-10-16 DIAGNOSIS — I2510 Atherosclerotic heart disease of native coronary artery without angina pectoris: Secondary | ICD-10-CM | POA: Diagnosis not present

## 2018-10-16 DIAGNOSIS — G9341 Metabolic encephalopathy: Secondary | ICD-10-CM

## 2018-10-16 DIAGNOSIS — I110 Hypertensive heart disease with heart failure: Secondary | ICD-10-CM | POA: Diagnosis not present

## 2018-10-16 DIAGNOSIS — I5042 Chronic combined systolic (congestive) and diastolic (congestive) heart failure: Secondary | ICD-10-CM | POA: Diagnosis present

## 2018-10-16 LAB — COMPREHENSIVE METABOLIC PANEL
ALT: 18 U/L (ref 0–44)
AST: 33 U/L (ref 15–41)
Albumin: 3.2 g/dL — ABNORMAL LOW (ref 3.5–5.0)
Alkaline Phosphatase: 68 U/L (ref 38–126)
Anion gap: 10 (ref 5–15)
BUN: 18 mg/dL (ref 6–20)
CO2: 25 mmol/L (ref 22–32)
Calcium: 9.5 mg/dL (ref 8.9–10.3)
Chloride: 103 mmol/L (ref 98–111)
Creatinine, Ser: 1.1 mg/dL (ref 0.61–1.24)
Glucose, Bld: 132 mg/dL — ABNORMAL HIGH (ref 70–99)
POTASSIUM: 3.6 mmol/L (ref 3.5–5.1)
SODIUM: 138 mmol/L (ref 135–145)
Total Bilirubin: 0.5 mg/dL (ref 0.3–1.2)
Total Protein: 7.6 g/dL (ref 6.5–8.1)

## 2018-10-16 LAB — I-STAT CHEM 8, ED
BUN: 20 mg/dL (ref 6–20)
CALCIUM ION: 1.19 mmol/L (ref 1.15–1.40)
CREATININE: 0.9 mg/dL (ref 0.61–1.24)
Chloride: 103 mmol/L (ref 98–111)
GLUCOSE: 127 mg/dL — AB (ref 70–99)
HCT: 43 % (ref 39.0–52.0)
HEMOGLOBIN: 14.6 g/dL (ref 13.0–17.0)
POTASSIUM: 3.7 mmol/L (ref 3.5–5.1)
Sodium: 141 mmol/L (ref 135–145)
TCO2: 26 mmol/L (ref 22–32)

## 2018-10-16 LAB — CBG MONITORING, ED
GLUCOSE-CAPILLARY: 106 mg/dL — AB (ref 70–99)
GLUCOSE-CAPILLARY: 89 mg/dL (ref 70–99)
GLUCOSE-CAPILLARY: 81 mg/dL (ref 70–99)

## 2018-10-16 LAB — URINALYSIS, ROUTINE W REFLEX MICROSCOPIC
Bacteria, UA: NONE SEEN
Bilirubin Urine: NEGATIVE
GLUCOSE, UA: NEGATIVE mg/dL
KETONES UR: 5 mg/dL — AB
LEUKOCYTES UA: NEGATIVE
Nitrite: NEGATIVE
Specific Gravity, Urine: 1.022 (ref 1.005–1.030)
pH: 6 (ref 5.0–8.0)

## 2018-10-16 LAB — CBC
HCT: 44.1 % (ref 39.0–52.0)
HEMOGLOBIN: 12.9 g/dL — AB (ref 13.0–17.0)
MCH: 23.2 pg — AB (ref 26.0–34.0)
MCHC: 29.3 g/dL — ABNORMAL LOW (ref 30.0–36.0)
MCV: 79.3 fL — AB (ref 80.0–100.0)
NRBC: 0 % (ref 0.0–0.2)
Platelets: 271 10*3/uL (ref 150–400)
RBC: 5.56 MIL/uL (ref 4.22–5.81)
RDW: 15.3 % (ref 11.5–15.5)
WBC: 6.5 10*3/uL (ref 4.0–10.5)

## 2018-10-16 LAB — RAPID URINE DRUG SCREEN, HOSP PERFORMED
AMPHETAMINES: NOT DETECTED
BENZODIAZEPINES: NOT DETECTED
Barbiturates: NOT DETECTED
COCAINE: NOT DETECTED
OPIATES: NOT DETECTED
TETRAHYDROCANNABINOL: NOT DETECTED

## 2018-10-16 LAB — ETHANOL: Alcohol, Ethyl (B): <10 mg/dL (ref <10)

## 2018-10-16 MED ORDER — SODIUM CHLORIDE 0.9% FLUSH: 3.0000 mL | INTRAVENOUS | Status: DC | PRN

## 2018-10-16 MED ORDER — IOPAMIDOL (ISOVUE-370) INJECTION 76%
INTRAVENOUS | Status: AC
  Filled 2018-10-16: qty 100

## 2018-10-16 MED ORDER — SODIUM CHLORIDE 0.9 % IV SOLN
250.0000 mL | INTRAVENOUS | Status: DC | PRN
  Administered 2018-10-17: 250 mL via INTRAVENOUS

## 2018-10-16 MED ORDER — IOPAMIDOL (ISOVUE-370) INJECTION 76%
100.0000 mL | Freq: Once | INTRAVENOUS | Status: AC | PRN
  Administered 2018-10-16: 50 mL via INTRAVENOUS

## 2018-10-16 MED ORDER — SODIUM CHLORIDE 0.9% FLUSH
3.0000 mL | Freq: Two times a day (BID) | INTRAVENOUS | Status: DC
  Administered 2018-10-17: 3 mL via INTRAVENOUS

## 2018-10-16 MED ORDER — DEXTROSE 50 % IV SOLN
50.0000 mL | Freq: Once | INTRAVENOUS | Status: AC
  Administered 2018-10-17: 50 mL via INTRAVENOUS
  Filled 2018-10-16: qty 50

## 2018-10-16 MED ORDER — IOPAMIDOL (ISOVUE-370) INJECTION 76%
100.0000 mL | Freq: Once | INTRAVENOUS | Status: AC | PRN
  Administered 2018-10-16: 40 mL via INTRAVENOUS

## 2018-10-16 MED ORDER — DEXTROSE 50 % IV SOLN: 50.0000 mL | INTRAVENOUS | Status: DC | PRN

## 2018-10-16 NOTE — ED Notes (Signed)
[employer] defibrillator interrogated.  Waiting on results.

## 2018-10-16 NOTE — H&P (Signed)
History and Physical    Jeffrey [name]:[id number] DOB: 1966/05/04 DOA: 10/16/2018  Referring MD/NP/PA:   PCP: Charlott Rakes, MD   Patient coming from:  The patient is coming from home.  At baseline, pt is independent for most of ADL.        Chief Complaint: AMS  HPI: Jeffrey [name] is a 52 y.o. male with medical history significant of hypertension, hyperlipidemia, diabetes mellitus, CAD, CHF with EF 20%, nonischemic cardiomyopathy, AICD placement, who presents with altered mental status.  Per patient's sister, patient was last known normal at 8:30 AM. Pt was found to to be confused in the evening.  He was found to be on the couch next to a broken chair by family member. Not sure what happed to him. Per EMS, pt was found to have hypoglycemia with blood sugar 48, which improved to 145 after giving 25 g of D10, but mental status has not improved.  Patient is still confused, not oriented x3.  No facial droop, slurred speech noted.  Patient moves all extremities.  Patient does not have active respiratory distress, cough, nausea, vomiting, diarrhea.  ED Course: pt was found to have WBC 6.5, negative UDS, negative urinalysis, electrolytes renal function okay, temperature normal, slightly tachycardia, RR 23, oxygen saturation 97% on room air, elevated blood pressure 189/166-->163/91.  CT head is negative for acute intracranial abnormalities.  CT of C-spine is negative for bony fracture.  Neurology was consulted, Dr. Cheral Marker recommended to get CT angiogram of head/neck and cerebral perfusion, which showed no LVO or stroke, but showed severe narrowing of the left vertebral artery V1 segment. Pt is placed on tele bed for obs.  Review of Systems: Could not be reviewed due to altered mental status.  Allergy: No Known Allergies  Past Medical History:  Diagnosis Date  . CAD (coronary artery disease)    a. cath 01/31/17: 60% 1st RPLB, 60% dist RCA, 55% prox RCA, 10% pro LAD --> Rx TX.   Marland Kitchen  Chronic systolic CHF (congestive heart failure) ([place]) 01/28/2017   1. Echo 01/29/17:  EF 20-25, normal wall motion, mild LAE // 2. EF 10-15 by [facility] 01/2017   . Diabetes mellitus   . Diabetic foot infection ([place]) 03/2016   RT FOOT  . HTN (hypertension)   . Hyperlipidemia   . NICM (nonischemic cardiomyopathy) ([place]) 02/15/2017   1. Mod non-obs CAD on [facility] in 01/2017 - CAD does not explain cardiomyopathy    Past Surgical History:  Procedure Laterality Date  . AMPUTATION Right 04/01/2016   Procedure: Right Great Toe Amputation;  Surgeon: Newt Minion, MD;  Location: [facility];  Service: Orthopedics;  Laterality: Right;  . AMPUTATION Right 06/19/2016   Procedure: AMPUTATION SECOND TOE;  Surgeon: Marybelle Killings, MD;  Location: [facility] [facility];  Service: Orthopedics;  Laterality: Right;  . AMPUTATION TOE Right   . BACK SURGERY     for abscess  . ICD IMPLANT N/A 01/15/2018   Procedure: ICD IMPLANT;  Surgeon: Deboraha Sprang, MD;  Location: [facility];  Service: Cardiovascular;  Laterality: N/A;  . RIGHT/LEFT HEART CATH AND CORONARY ANGIOGRAPHY N/A 01/31/2017   Procedure: Right/Left Heart Cath and Coronary Angiography;  Surgeon: Troy Sine, MD;  Location: [facility];  Service: Cardiovascular;  Laterality: N/A;    Social History:  reports that he has never smoked. He has never used smokeless tobacco. He reports that he does not drink alcohol or use drugs.  Family History:  Family History  Problem Relation Age of Onset  . Diabetes Mother   . Hypertension Mother   . Diabetes Father   . Heart attack Father   . Diabetes Sister   . Heart attack Maternal Grandmother      Prior to Admission medications   Medication Sig Start Date End Date Taking? Authorizing Provider  acetaminophen (TYLENOL) 500 MG tablet Take 1,000 mg by mouth every 6 (six) hours as needed for headache (pain).    [provider]  albuterol (PROVENTIL HFA;VENTOLIN HFA) 108 (90 Base) MCG/ACT inhaler Inhale 1-2 puffs into the  lungs every 6 (six) hours as needed for wheezing or shortness of breath. 11/15/16   Recardo Evangelist, PA-C  aspirin EC 81 MG EC tablet Take 1 tablet (81 mg total) by mouth daily. 01/16/18   Sheikh, Omair Latif, DO  benzonatate (TESSALON) 100 MG capsule Take 1 capsule (100 mg total) by mouth 2 (two) times daily as needed for cough. 06/22/18   Charlott Rakes, MD  benzonatate (TESSALON) 100 MG capsule TAKE 1 CAPSULE BY MOUTH 3 TIMES DAILY AS NEEDED FOR COUGH. 07/19/18   Charlott Rakes, MD  Blood Glucose Monitoring Suppl (TRUE METRIX METER) DEVI 1 each by Does not apply route 3 (three) times daily before meals. 02/15/17   Charlott Rakes, MD  carvedilol (COREG) 6.25 MG tablet Take 2 tablets (12.5 mg total) by mouth 2 (two) times daily with a meal. 04/12/18   Deboraha Sprang, MD  cyclobenzaprine (FLEXERIL) 5 MG tablet Take 1 tablet (5 mg total) at bedtime by mouth. Patient taking differently: Take 5 mg by mouth at bedtime as needed for muscle spasms.  10/21/17   Caccavale, Sophia, PA-C  ferrous sulfate 325 (65 FE) MG tablet Take 1 tablet (325 mg total) by mouth 2 (two) times daily with a meal. Patient taking differently: Take 325 mg by mouth daily with breakfast.  04/03/16   Dhungel, Nishant, MD  furosemide (LASIX) 40 MG tablet Take 1 tablet (40 mg total) by mouth daily. 06/22/18 09/20/18  Charlott Rakes, MD  gabapentin (NEURONTIN) 400 MG capsule Take 1 capsule (400 mg total) by mouth 3 (three) times daily. 06/22/18   Charlott Rakes, MD  glucose blood (TRUE METRIX BLOOD GLUCOSE TEST) test strip Use 3 times daily before meals 02/15/17   Charlott Rakes, MD  insulin aspart (NOVOLOG) 100 UNIT/ML injection Inject 0-15 Units into the skin 3 (three) times daily with meals. Sliding scale  CBG 70 - 120: 0 units: CBG 121 - 140: 2 units; CBG 140 - 200: 4 units; CBG 201 - 240: 6 units; CBG 240 - 300: 8 units;CBG 300 - 350: 12 units; CBG 351 - 400: 16 units; CBG > 400 : 16 units and notify your  MD 12/20/17   Charlott Rakes, MD    Insulin Glargine (LANTUS SOLOSTAR) 100 UNIT/ML Solostar Pen Inject 45 Units into the skin 2 (two) times daily. 06/25/18   Charlott Rakes, MD  Insulin Pen Needle (TRUEPLUS PEN NEEDLES) 32G X 4 MM MISC Use to inject Lantus daily. Must use new pen needle with each injection. 09/17/18   Charlott Rakes, MD  potassium chloride (K-DUR) 10 MEQ tablet Take 1 tablet (10 mEq total) by mouth daily. 06/22/18 09/20/18  Charlott Rakes, MD  rosuvastatin (CRESTOR) 40 MG tablet Take 1 tablet (40 mg total) by mouth daily. 06/22/18   Charlott Rakes, MD  sacubitril-valsartan (ENTRESTO) 49-51 MG Take 1 tablet by mouth 2 (two) times daily. 04/12/18   Deboraha Sprang,  MD  spironolactone (ALDACTONE) 25 MG tablet Take 1 tablet (25 mg total) by mouth daily. 06/22/18   Charlott Rakes, MD  TRUEPLUS LANCETS 28G MISC 1 each by Does not apply route 3 (three) times daily before meals. 02/15/17   Charlott Rakes, MD    Physical Exam: Vitals:   10/16/18 2200 10/16/18 2215 10/16/18 2245 10/16/18 2315  BP: (!) 141/92 (!) 141/94 (!) 150/88 (!) 163/91  Pulse: (!) 102 99 98 95  Resp: 15 14 17  (!) 23  Temp:      TempSrc:      SpO2: 99% 97% 98% 97%  Weight:      Height:       General: Not in acute distress HEENT:       Eyes: PERRL, EOMI, no scleral icterus.       ENT: No discharge from the ears and nose, no pharynx injection, no tonsillar enlargement.        Neck: No JVD, no bruit, no mass felt. Heme: No neck lymph node enlargement. Cardiac: S1/S2, RRR, No murmurs, No gallops or rubs. Respiratory: No rales, wheezing, rhonchi or rubs. GI: Soft, nondistended, nontender, no rebound pain, no organomegaly, BS present. GU: No hematuria Ext: No pitting leg edema bilaterally. 2+DP/PT pulse bilaterally. Musculoskeletal: No joint deformities, No joint redness or warmth, no limitation of ROM in spin. Skin: No rashes.  Neuro: confused, not following commands, not oriented X3, cranial nerves II-XII grossly intact, moves all extremities  normally. Psych: Patient is not psychotic, no suicidal or hemocidal ideation.  Labs on Admission: I have personally reviewed following labs and imaging studies  CBC: Recent Labs  Lab 10/16/18 1903 10/16/18 1912  WBC 6.5  --   HGB 12.9* 14.6  HCT 44.1 43.0  MCV 79.3*  --   PLT 271  --    Basic Metabolic Panel: Recent Labs  Lab 10/16/18 1903 10/16/18 1912  NA 138 141  K 3.6 3.7  CL 103 103  CO2 25  --   GLUCOSE 132* 127*  BUN 18 20  CREATININE 1.10 0.90  CALCIUM 9.5  --    GFR: Estimated Creatinine Clearance: 97.1 mL/min (by C-G formula based on SCr of 0.9 mg/dL). Liver Function Tests: Recent Labs  Lab 10/16/18 1903  AST 33  ALT 18  ALKPHOS 68  BILITOT 0.5  PROT 7.6  ALBUMIN 3.2*   No results for input(s): LIPASE, AMYLASE in the last 168 hours. No results for input(s): AMMONIA in the last 168 hours. Coagulation Profile: No results for input(s): INR, PROTIME in the last 168 hours. Cardiac Enzymes: No results for input(s): CKTOTAL, CKMB, CKMBINDEX, TROPONINI in the last 168 hours. BNP (last 3 results) No results for input(s): PROBNP in the last 8760 hours. HbA1C: No results for input(s): HGBA1C in the last 72 hours. CBG: Recent Labs  Lab 10/16/18 1851 10/16/18 2020 10/16/18 2220 10/17/18 0014  GLUCAP 106* 89 81 101*   Lipid Profile: No results for input(s): CHOL, HDL, LDLCALC, TRIG, CHOLHDL, LDLDIRECT in the last 72 hours. Thyroid Function Tests: No results for input(s): TSH, T4TOTAL, FREET4, T3FREE, THYROIDAB in the last 72 hours. Anemia Panel: No results for input(s): VITAMINB12, FOLATE, FERRITIN, TIBC, IRON, RETICCTPCT in the last 72 hours. Urine analysis:    Component Value Date/Time   COLORURINE YELLOW 10/16/2018 2242   APPEARANCEUR CLEAR 10/16/2018 2242   LABSPEC 1.022 10/16/2018 2242   PHURINE 6.0 10/16/2018 2242   GLUCOSEU NEGATIVE 10/16/2018 2242   HGBUR MODERATE (A) 10/16/2018 2242  BILIRUBINUR NEGATIVE 10/16/2018 2242   BILIRUBINUR  neg 03/30/2016 1116   KETONESUR 5 (A) 10/16/2018 2242   PROTEINUR >=300 (A) 10/16/2018 2242   UROBILINOGEN 0.2 03/30/2016 1116   UROBILINOGEN 1.0 03/10/2014 2230   NITRITE NEGATIVE 10/16/2018 2242   LEUKOCYTESUR NEGATIVE 10/16/2018 2242   Sepsis Labs: @LABRCNTIP (procalcitonin:4,lacticidven:4) )No results found for this or any previous visit (from the past 240 hour(s)).   Radiological Exams on Admission: Ct Angio Head W Or Wo Contrast  Result Date: 10/16/2018 CLINICAL DATA:  Altered mental status.  Found down. EXAM: CT ANGIOGRAPHY HEAD AND NECK CT PERFUSION BRAIN TECHNIQUE: Multidetector CT imaging of the head and neck was performed using the standard protocol during bolus administration of intravenous contrast. Multiplanar CT image reconstructions and MIPs were obtained to evaluate the vascular anatomy. Carotid stenosis measurements (when applicable) are obtained utilizing NASCET criteria, using the distal internal carotid diameter as the denominator. Multiphase CT imaging of the brain was performed following IV bolus contrast injection. Subsequent parametric perfusion maps were calculated using RAPID software. CONTRAST:  50 mL Isovue 370 COMPARISON:  Head CT 10/16/2018 FINDINGS: CTA NECK FINDINGS SKELETON: There is no bony spinal canal stenosis. No lytic or blastic lesion. OTHER NECK: Normal pharynx, larynx and major salivary glands. No cervical lymphadenopathy. Unremarkable thyroid gland. UPPER CHEST: No pneumothorax or pleural effusion. No nodules or masses. AORTIC ARCH: There is no calcific atherosclerosis of the aortic arch. There is no aneurysm, dissection or hemodynamically significant stenosis of the visualized ascending aorta and aortic arch. Conventional 3 vessel aortic branching pattern. The visualized proximal subclavian arteries are widely patent. RIGHT CAROTID SYSTEM: --Common carotid artery: Widely patent origin without common carotid artery dissection or aneurysm. --Internal carotid  artery: No dissection, occlusion or aneurysm. No hemodynamically significant stenosis. --External carotid artery: No acute abnormality. LEFT CAROTID SYSTEM: --Common carotid artery: Widely patent origin without common carotid artery dissection or aneurysm. --Internal carotid artery: No dissection, occlusion or aneurysm. No hemodynamically significant stenosis. --External carotid artery: No acute abnormality. VERTEBRAL ARTERIES: Codominant configuration. There is severe stenosis of the left V1 segment. Otherwise, both vertebral arteries are normal. No dissection, occlusion or flow-limiting stenosis to the vertebrobasilar confluence. CTA HEAD FINDINGS ANTERIOR CIRCULATION: --Intracranial internal carotid arteries: Normal. --Anterior cerebral arteries: Normal. Both A1 segments are present. Patent anterior communicating artery. --Middle cerebral arteries: Normal. --Posterior communicating arteries: Absent bilaterally. POSTERIOR CIRCULATION: --Basilar artery: Normal. --Posterior cerebral arteries: Normal. --Superior cerebellar arteries: Normal. --Inferior cerebellar arteries: Normal anterior and posterior inferior cerebellar arteries. VENOUS SINUSES: As permitted by contrast timing, patent. ANATOMIC VARIANTS: None DELAYED PHASE: Not performed. Review of the MIP images confirms the above findings. CT Brain Perfusion Findings: CBF (<30%) Volume: 55mL Perfusion (Tmax>6.0s) volume: 5mL Mismatch Volume: 71mL Infarction Location:n/a IMPRESSION: 1. No emergent large vessel occlusion. No infarct by CT perfusion criteria. 2. 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 Otherwise normal carotid and vertebral systems. Electronically Signed   By: Ulyses Jarred M.D.   On: 10/16/2018 23:26   Ct Head Wo Contrast  Result Date: 10/16/2018 CLINICAL DATA:  Altered mental status. Hypoglycemia. EXAM: CT HEAD WITHOUT CONTRAST CT CERVICAL SPINE WITHOUT CONTRAST TECHNIQUE: Multidetector CT imaging of the head and cervical spine  was performed following the standard protocol without intravenous contrast. Multiplanar CT image reconstructions of the cervical spine were also generated. COMPARISON:  01/12/2018 FINDINGS: CT HEAD FINDINGS Brain: There is no evidence of acute infarct, intracranial hemorrhage, mass, midline shift, or extra-axial fluid collection. The ventricles and sulci are normal. Cerebral  white matter hypodensities are nonspecific but compatible with mild chronic small vessel ischemic disease. 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 Vascular: No hyperdense vessel. Skull: No fracture or focal osseous lesion. Sinuses/Orbits: Paranasal sinuses and mastoid air cells are clear. Unremarkable orbits. Other: None. CT CERVICAL SPINE FINDINGS Alignment: Mild cervical spine straightening. No listhesis. Skull base and vertebrae: No acute fracture or suspicious osseous lesion. Soft tissues and spinal canal: No prevertebral fluid or swelling. No visible canal hematoma. Disc levels:  Mild cervical spondylosis. Upper chest: Clear lung apices. Other: None. IMPRESSION: 1. No evidence of acute intracranial abnormality. 2. Mild chronic small vessel ischemic disease. 3. No evidence of acute fracture or subluxation in the cervical spine. Electronically Signed   By: Logan Bores M.D.   On: 10/16/2018 20:21   Ct Angio Neck W And/or Wo Contrast  Result Date: 10/16/2018 CLINICAL DATA:  Altered mental status.  Found down. EXAM: CT ANGIOGRAPHY HEAD AND NECK CT PERFUSION BRAIN TECHNIQUE: Multidetector CT imaging of the head and neck was performed using the standard protocol during bolus administration of intravenous contrast. Multiplanar CT image reconstructions and MIPs were obtained to evaluate the vascular anatomy. Carotid stenosis measurements (when applicable) are obtained utilizing NASCET criteria, using the distal internal carotid diameter as the denominator. Multiphase CT imaging of the brain was performed following IV  bolus contrast injection. Subsequent parametric perfusion maps were calculated using RAPID software. CONTRAST:  50 mL Isovue 370 COMPARISON:  Head CT 10/16/2018 FINDINGS: CTA NECK FINDINGS SKELETON: There is no bony spinal canal stenosis. No lytic or blastic lesion. OTHER NECK: Normal pharynx, larynx and major salivary glands. No cervical lymphadenopathy. Unremarkable thyroid gland. UPPER CHEST: No pneumothorax or pleural effusion. No nodules or masses. AORTIC ARCH: There is no calcific atherosclerosis of the aortic arch. There is no aneurysm, dissection or hemodynamically significant stenosis of the visualized ascending aorta and aortic arch. Conventional 3 vessel aortic branching pattern. The visualized proximal subclavian arteries are widely patent. RIGHT CAROTID SYSTEM: --Common carotid artery: Widely patent origin without common carotid artery dissection or aneurysm. --Internal carotid artery: No dissection, occlusion or aneurysm. No hemodynamically significant stenosis. --External carotid artery: No acute abnormality. LEFT CAROTID SYSTEM: --Common carotid artery: Widely patent origin without common carotid artery dissection or aneurysm. --Internal carotid artery: No dissection, occlusion or aneurysm. No hemodynamically significant stenosis. --External carotid artery: No acute abnormality. VERTEBRAL ARTERIES: Codominant configuration. There is severe stenosis of the left V1 segment. Otherwise, both vertebral arteries are normal. No dissection, occlusion or flow-limiting stenosis to the vertebrobasilar confluence. CTA HEAD FINDINGS ANTERIOR CIRCULATION: --Intracranial internal carotid arteries: Normal. --Anterior cerebral arteries: Normal. Both A1 segments are present. Patent anterior communicating artery. --Middle cerebral arteries: Normal. --Posterior communicating arteries: Absent bilaterally. POSTERIOR CIRCULATION: --Basilar artery: Normal. --Posterior cerebral arteries: Normal. --Superior cerebellar  arteries: Normal. --Inferior cerebellar arteries: Normal anterior and posterior inferior cerebellar arteries. VENOUS SINUSES: As permitted by contrast timing, patent. ANATOMIC VARIANTS: None DELAYED PHASE: Not performed. Review of the MIP images confirms the above findings. CT Brain Perfusion Findings: CBF (<30%) Volume: 57mL Perfusion (Tmax>6.0s) volume: 51mL Mismatch Volume: 55mL Infarction Location:n/a IMPRESSION: 1. No emergent large vessel occlusion. No infarct by CT perfusion criteria. 2. 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 Otherwise normal carotid and vertebral systems. Electronically Signed   By: Ulyses Jarred M.D.   On: 10/16/2018 23:26   Ct Cervical Spine Wo Contrast  Result Date: 10/16/2018 CLINICAL DATA:  Altered mental status. Hypoglycemia. EXAM: CT HEAD  WITHOUT CONTRAST CT CERVICAL SPINE WITHOUT CONTRAST TECHNIQUE: Multidetector CT imaging of the head and cervical spine was performed following the standard protocol without intravenous contrast. Multiplanar CT image reconstructions of the cervical spine were also generated. COMPARISON:  01/12/2018 FINDINGS: CT HEAD FINDINGS Brain: There is no evidence of acute infarct, intracranial hemorrhage, mass, midline shift, or extra-axial fluid collection. The ventricles and sulci are normal. Cerebral white matter hypodensities are nonspecific but compatible with mild chronic small vessel ischemic disease. 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 Vascular: No hyperdense vessel. Skull: No fracture or focal osseous lesion. Sinuses/Orbits: Paranasal sinuses and mastoid air cells are clear. Unremarkable orbits. Other: None. CT CERVICAL SPINE FINDINGS Alignment: Mild cervical spine straightening. No listhesis. Skull base and vertebrae: No acute fracture or suspicious osseous lesion. Soft tissues and spinal canal: No prevertebral fluid or swelling. No visible canal hematoma. Disc levels:  Mild cervical spondylosis. Upper  chest: Clear lung apices. Other: None. IMPRESSION: 1. No evidence of acute intracranial abnormality. 2. Mild chronic small vessel ischemic disease. 3. No evidence of acute fracture or subluxation in the cervical spine. Electronically Signed   By: Logan Bores M.D.   On: 10/16/2018 20:21   Ct Cerebral Perfusion W Contrast  Result Date: 10/16/2018 CLINICAL DATA:  Altered mental status.  Found down. EXAM: CT ANGIOGRAPHY HEAD AND NECK CT PERFUSION BRAIN TECHNIQUE: Multidetector CT imaging of the head and neck was performed using the standard protocol during bolus administration of intravenous contrast. Multiplanar CT image reconstructions and MIPs were obtained to evaluate the vascular anatomy. Carotid stenosis measurements (when applicable) are obtained utilizing NASCET criteria, using the distal internal carotid diameter as the denominator. Multiphase CT imaging of the brain was performed following IV bolus contrast injection. Subsequent parametric perfusion maps were calculated using RAPID software. CONTRAST:  50 mL Isovue 370 COMPARISON:  Head CT 10/16/2018 FINDINGS: CTA NECK FINDINGS SKELETON: There is no bony spinal canal stenosis. No lytic or blastic lesion. OTHER NECK: Normal pharynx, larynx and major salivary glands. No cervical lymphadenopathy. Unremarkable thyroid gland. UPPER CHEST: No pneumothorax or pleural effusion. No nodules or masses. AORTIC ARCH: There is no calcific atherosclerosis of the aortic arch. There is no aneurysm, dissection or hemodynamically significant stenosis of the visualized ascending aorta and aortic arch. Conventional 3 vessel aortic branching pattern. The visualized proximal subclavian arteries are widely patent. RIGHT CAROTID SYSTEM: --Common carotid artery: Widely patent origin without common carotid artery dissection or aneurysm. --Internal carotid artery: No dissection, occlusion or aneurysm. No hemodynamically significant stenosis. --External carotid artery: No acute  abnormality. LEFT CAROTID SYSTEM: --Common carotid artery: Widely patent origin without common carotid artery dissection or aneurysm. --Internal carotid artery: No dissection, occlusion or aneurysm. No hemodynamically significant stenosis. --External carotid artery: No acute abnormality. VERTEBRAL ARTERIES: Codominant configuration. There is severe stenosis of the left V1 segment. Otherwise, both vertebral arteries are normal. No dissection, occlusion or flow-limiting stenosis to the vertebrobasilar confluence. CTA HEAD FINDINGS ANTERIOR CIRCULATION: --Intracranial internal carotid arteries: Normal. --Anterior cerebral arteries: Normal. Both A1 segments are present. Patent anterior communicating artery. --Middle cerebral arteries: Normal. --Posterior communicating arteries: Absent bilaterally. POSTERIOR CIRCULATION: --Basilar artery: Normal. --Posterior cerebral arteries: Normal. --Superior cerebellar arteries: Normal. --Inferior cerebellar arteries: Normal anterior and posterior inferior cerebellar arteries. VENOUS SINUSES: As permitted by contrast timing, patent. ANATOMIC VARIANTS: None DELAYED PHASE: Not performed. Review of the MIP images confirms the above findings. CT Brain Perfusion Findings: CBF (<30%) Volume: 65mL Perfusion (Tmax>6.0s) volume: 4mL Mismatch Volume: 69mL  Infarction Location:n/a IMPRESSION: 1. No emergent large vessel occlusion. No infarct by CT perfusion criteria. 2. 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 Otherwise normal carotid and vertebral systems. Electronically Signed   By: Ulyses Jarred M.D.   On: 10/16/2018 23:26   Dg Chest Port 1 View  Result Date: 10/16/2018 CLINICAL DATA:  Altered level of consciousness EXAM: PORTABLE CHEST 1 VIEW COMPARISON:  06/22/2018 FINDINGS: Left-sided pacing device incompletely visualized lead. Cardiomegaly with central vascular congestion. No pleural effusion. No pneumothorax. IMPRESSION: Cardiomegaly with mild central congestion  Electronically Signed   By: Donavan Foil M.D.   On: 10/16/2018 19:53     EKG: Independently reviewed.  Sinus rhythm, regular, QTc 461, nonspecific T wave change.   Assessment/Plan Principal Problem:   Acute metabolic encephalopathy Active Problems:   Essential hypertension   Uncontrolled type 2 diabetes mellitus with hyperglycemia, with long-term current use of insulin (HCC)   Chronic combined systolic (congestive) and diastolic (congestive) heart failure (HCC)   NICM (nonischemic cardiomyopathy) (HCC)   Coronary artery disease involving native coronary artery of native heart without angina pectoris   Hyperlipidemia   S/P ICD (internal cardiac defibrillator) procedure   Hypoglycemia   Acute metabolic encephalopathy: Etiology is not completely clear, but likely due to prolonged hypoglycemia.  No focal neurologic findings on physical examination.  CT angiogram of head and neck and CT of brain perfusion are negative for stroke or LVO.   -will place on telemetry bed for observation -Frequent neuro check -Monitor blood sugar closely as below -If no improvement, will need to call back neurology in AM -hold Hold all home medications until mental status improves -Keep n.p.o.  Hypoglycemia: Not sure why patient has developed hypoglycemia.  Patient may have missed dosing his insulin -hold insulin -q1h CBG -prn D50  Essential hypertension: -IV hydralazine PRN -hold   Uncontrolled type 2 diabetes mellitus with hyperglycemia, with long-term current use of insulin ([place]): Last A1c 15.5 on 06/22/18, very poorly controled. Patient is taking NovoLog and Lantus at home.  Now has hypoglycemia -hold inusulin  Chronic combined diastolic and systolic heart failure and NICM (nonischemic cardiomyopathy) ([place]): 2D echo on 01/14/2018 showed EF 20% with grade 2 diastolic dysfunction.  Patient does not have leg edema, no respiratory distress, no JVD, CHF seems to be compensated -Hold Lasix and  spironolactone until mental status improves -If patient does not have improvement, may need to start IV Lasix in the morning. -Hold Entresto  Coronary artery disease involving native coronary artery of native heart without angina pectoris: pt does not seem to have chest pain. -observe -Hold home medications until mental status improves -will start ASA per rectum in AM if mental status not improve  Hyperlipidemia: -hold Crestor  S/P ICD (internal cardiac defibrillator) procedure:   DVT ppx:   SQ Lovenox Code Status: Full code Family Communication:  Yes, patient's  Sister  at bed side Disposition Plan:  Anticipate discharge back to previous home environment Consults called:  Dr. Cheral Marker or neuro Admission status: Obs / tele     Date of Service 10/17/2018    Ivor Costa [facility] Pager [phone]  If 7PM-7AM, please contact night-coverage [email or web address] Password [email or web address] 10/17/2018, 12:25 AM

## 2018-10-16 NOTE — ED Notes (Signed)
Patient transported to CT 

## 2018-10-16 NOTE — ED Provider Notes (Signed)
[facility] EMERGENCY DEPARTMENT Provider Note   CSN: [id number] Arrival date & time: 10/16/18  0865     History   Chief Complaint Chief Complaint  Patient presents with  . Hypoglycemia    HPI Jeffrey [name] is a 52 y.o. male.  Pt presents to the ED today with [facility].  The pt was last seen at 0830 today.  The pt was found by his family on the couch very confused.  He was incontinent of urine.  When EMS arrived, his blood sugar was 48.  They gave him 25g D10 and blood sugar came up to 145.  MS has improved, but not completely.  EMS also reports that there was a broken chair by the couch where they found him.  Unkn if he fell.  The pt is not speaking now, but is looking at me and following commands.     Past Medical History:  Diagnosis Date  . CAD (coronary artery disease)    a. cath 01/31/17: 60% 1st RPLB, 60% dist RCA, 55% prox RCA, 10% pro LAD --> Rx TX.   Marland Kitchen Chronic systolic CHF (congestive heart failure) ([place]) 01/28/2017   1. Echo 01/29/17:  EF 20-25, normal wall motion, mild LAE // 2. EF 10-15 by [facility] 01/2017   . Diabetes mellitus   . Diabetic foot infection ([place]) 03/2016   RT FOOT  . HTN (hypertension)   . Hyperlipidemia   . NICM (nonischemic cardiomyopathy) ([place]) 02/15/2017   1. Mod non-obs CAD on [facility] in 01/2017 - CAD does not explain cardiomyopathy    Patient Active Problem List   Diagnosis Date Noted  . Hypoglycemia 10/16/2018  . Acute metabolic encephalopathy 78/46/9629  . S/P ICD (internal cardiac defibrillator) procedure 01/19/2018  . Cardiac device in situ   . Syncope 01/13/2018  . NICM (nonischemic cardiomyopathy) ([place]) 02/15/2017  . Coronary artery disease involving native coronary artery of native heart without angina pectoris 02/15/2017  . Hyperlipidemia 02/15/2017  . Chronic systolic heart failure ([place]) 01/28/2017  . Folliculitis 52/84/1324  . 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 Anemia, iron  deficiency   . Noncompliance with medication regimen 03/30/2016  . Uncontrolled type 2 diabetes mellitus with hyperglycemia, with long-term current use of insulin ([place])   . Cellulitis 09/25/2015  . Essential hypertension 08/21/2013  . Diabetic neuropathy ([place]) 08/21/2013  . Fungal toenail infection 08/21/2013    Past Surgical History:  Procedure Laterality Date  . AMPUTATION Right 04/01/2016   Procedure: Right Great Toe Amputation;  Surgeon: Newt Minion, MD;  Location: [facility];  Service: Orthopedics;  Laterality: Right;  . AMPUTATION Right 06/19/2016   Procedure: AMPUTATION SECOND TOE;  Surgeon: Marybelle Killings, MD;  Location: [facility];  Service: Orthopedics;  Laterality: Right;  . AMPUTATION TOE Right   . BACK SURGERY     for abscess  . ICD IMPLANT N/A 01/15/2018   Procedure: ICD IMPLANT;  Surgeon: Deboraha Sprang, MD;  Location: [facility] CV LAB;  Service: Cardiovascular;  Laterality: N/A;  . RIGHT/LEFT HEART CATH AND CORONARY ANGIOGRAPHY N/A 01/31/2017   Procedure: Right/Left Heart Cath and Coronary Angiography;  Surgeon: Troy Sine, MD;  Location: [place] CV LAB;  Service: Cardiovascular;  Laterality: N/A;        Home Medications    Prior to Admission medications   Medication Sig Start Date End Date Taking? Authorizing Provider  acetaminophen (TYLENOL) 500 MG tablet Take 1,000 mg by mouth every  6 (six) hours as needed for headache (pain).    [provider]  albuterol (PROVENTIL HFA;VENTOLIN HFA) 108 (90 Base) MCG/ACT inhaler Inhale 1-2 puffs into the lungs every 6 (six) hours as needed for wheezing or shortness of breath. 11/15/16   Recardo Evangelist, PA-C  aspirin EC 81 MG EC tablet Take 1 tablet (81 mg total) by mouth daily. 01/16/18   Sheikh, Omair Latif, DO  benzonatate (TESSALON) 100 MG capsule Take 1 capsule (100 mg total) by mouth 2 (two) times daily as needed for cough. 06/22/18   Charlott Rakes, MD  benzonatate (TESSALON) 100 MG capsule TAKE 1 CAPSULE BY MOUTH 3  TIMES DAILY AS NEEDED FOR COUGH. 07/19/18   Charlott Rakes, MD  Blood Glucose Monitoring Suppl (TRUE METRIX METER) DEVI 1 each by Does not apply route 3 (three) times daily before meals. 02/15/17   Charlott Rakes, MD  carvedilol (COREG) 6.25 MG tablet Take 2 tablets (12.5 mg total) by mouth 2 (two) times daily with a meal. 04/12/18   Deboraha Sprang, MD  cyclobenzaprine (FLEXERIL) 5 MG tablet Take 1 tablet (5 mg total) at bedtime by mouth. Patient taking differently: Take 5 mg by mouth at bedtime as needed for muscle spasms.  10/21/17   Caccavale, Sophia, PA-C  ferrous sulfate 325 (65 FE) MG tablet Take 1 tablet (325 mg total) by mouth 2 (two) times daily with a meal. Patient taking differently: Take 325 mg by mouth daily with breakfast.  04/03/16   Dhungel, Nishant, MD  furosemide (LASIX) 40 MG tablet Take 1 tablet (40 mg total) by mouth daily. 06/22/18 09/20/18  Charlott Rakes, MD  gabapentin (NEURONTIN) 400 MG capsule Take 1 capsule (400 mg total) by mouth 3 (three) times daily. 06/22/18   Charlott Rakes, MD  glucose blood (TRUE METRIX BLOOD GLUCOSE TEST) test strip Use 3 times daily before meals 02/15/17   Charlott Rakes, MD  insulin aspart (NOVOLOG) 100 UNIT/ML injection Inject 0-15 Units into the skin 3 (three) times daily with meals. Sliding scale  CBG 70 - 120: 0 units: CBG 121 - 140: 2 units; CBG 140 - 200: 4 units; CBG 201 - 240: 6 units; CBG 240 - 300: 8 units;CBG 300 - 350: 12 units; CBG 351 - 400: 16 units; CBG > 400 : 16 units and notify your  MD 12/20/17   Charlott Rakes, MD  Insulin Glargine (LANTUS SOLOSTAR) 100 UNIT/ML Solostar Pen Inject 45 Units into the skin 2 (two) times daily. 06/25/18   Charlott Rakes, MD  Insulin Pen Needle (TRUEPLUS PEN NEEDLES) 32G X 4 MM MISC Use to inject Lantus daily. Must use new pen needle with each injection. 09/17/18   Charlott Rakes, MD  potassium chloride (K-DUR) 10 MEQ tablet Take 1 tablet (10 mEq total) by mouth daily. 06/22/18 09/20/18  Charlott Rakes, MD   rosuvastatin (CRESTOR) 40 MG tablet Take 1 tablet (40 mg total) by mouth daily. 06/22/18   Charlott Rakes, MD  sacubitril-valsartan (ENTRESTO) 49-51 MG Take 1 tablet by mouth 2 (two) times daily. 04/12/18   Deboraha Sprang, MD  spironolactone (ALDACTONE) 25 MG tablet Take 1 tablet (25 mg total) by mouth daily. 06/22/18   Charlott Rakes, MD  TRUEPLUS LANCETS 28G MISC 1 each by Does not apply route 3 (three) times daily before meals. 02/15/17   Charlott Rakes, MD    Family History Family History  Problem Relation Age of Onset  . Diabetes Mother   . Hypertension Mother   . Diabetes Father   .  Heart attack Father   . Diabetes Sister   . Heart attack Maternal Grandmother     Social History Social History   Tobacco Use  . Smoking status: Never Smoker  . Smokeless tobacco: Never Used  Substance Use Topics  . Alcohol use: No  . Drug use: No     Allergies   Patient has no known allergies.   Review of Systems Review of Systems  Unable to perform ROS: Mental status change  All other systems reviewed and are negative.    Physical Exam Updated Vital Signs BP (!) 163/91   Pulse 95   Temp 98.6 F (37 C) (Oral)   Resp (!) 23   Ht 5\' 7"  (1.702 m)   Wt 81.6 kg   SpO2 97%   BMI 28.19 kg/m   Physical Exam  Constitutional: He appears well-developed and well-nourished.  HENT:  Head: Normocephalic and atraumatic.  Right Ear: External ear normal.  Left Ear: External ear normal.  Nose: Nose normal.  Mouth/Throat: Mucous membranes are dry.  Eyes: Pupils are equal, round, and reactive to light. Conjunctivae and EOM are normal.  Neck: Normal range of motion. Neck supple.  Cardiovascular: Normal rate, regular rhythm, normal heart sounds and intact distal pulses.  Pulmonary/Chest: Effort normal and breath sounds normal.  Abdominal: Soft. Bowel sounds are normal.  Musculoskeletal: Normal range of motion.  Right foot:  Transmetatarsal amputation  Neurological: He is alert.  Pt is  following commands, but slowly.  He is moving all 4 extremities.  Skin: Skin is warm. Capillary refill takes less than 2 seconds.  Psychiatric:  Unable to assess  Nursing note and vitals reviewed.    ED Treatments / Results  Labs (all labs ordered are listed, but only abnormal results are displayed) Labs Reviewed  CBC - Abnormal; Notable for the following components:      Result Value   Hemoglobin 12.9 (*)    MCV 79.3 (*)    MCH 23.2 (*)    MCHC 29.3 (*)    All other components within normal limits  URINALYSIS, ROUTINE W REFLEX MICROSCOPIC - Abnormal; Notable for the following components:   Hgb urine dipstick MODERATE (*)    Ketones, ur 5 (*)    Protein, ur >=300 (*)    All other components within normal limits  COMPREHENSIVE METABOLIC PANEL - Abnormal; Notable for the following components:   Glucose, Bld 132 (*)    Albumin 3.2 (*)    All other components within normal limits  CBG MONITORING, ED - Abnormal; Notable for the following components:   Glucose-Capillary 106 (*)    All other components within normal limits  I-STAT CHEM 8, ED - Abnormal; Notable for the following components:   Glucose, Bld 127 (*)    All other components within normal limits  RAPID URINE DRUG SCREEN, HOSP PERFORMED  ETHANOL  CBG MONITORING, ED  CBG MONITORING, ED  CBG MONITORING, ED    EKG EKG Interpretation  Date/Time:  Tuesday October 16 2018 18:52:23 EST Ventricular Rate:  85 PR Interval:    QRS Duration: 86 QT Interval:  387 QTC Calculation: 461 R Axis:   7 Text Interpretation:  Sinus rhythm Borderline T wave abnormalities No significant change since last tracing Confirmed by Isla Pence [phone]) on 10/16/2018 6:54:29 PM   Radiology Ct Angio Head W Or Wo Contrast  Result Date: 10/16/2018 CLINICAL DATA:  Altered mental status.  Found down. EXAM: CT ANGIOGRAPHY HEAD AND NECK CT PERFUSION BRAIN TECHNIQUE: Multidetector  CT imaging of the head and neck was performed using the  standard protocol during bolus administration of intravenous contrast. Multiplanar CT image reconstructions and MIPs were obtained to evaluate the vascular anatomy. Carotid stenosis measurements (when applicable) are obtained utilizing NASCET criteria, using the distal internal carotid diameter as the denominator. Multiphase CT imaging of the brain was performed following IV bolus contrast injection. Subsequent parametric perfusion maps were calculated using RAPID software. CONTRAST:  50 mL Isovue 370 COMPARISON:  Head CT 10/16/2018 FINDINGS: CTA NECK FINDINGS SKELETON: There is no bony spinal canal stenosis. No lytic or blastic lesion. OTHER NECK: Normal pharynx, larynx and major salivary glands. No cervical lymphadenopathy. Unremarkable thyroid gland. UPPER CHEST: No pneumothorax or pleural effusion. No nodules or masses. AORTIC ARCH: There is no calcific atherosclerosis of the aortic arch. There is no aneurysm, dissection or hemodynamically significant stenosis of the visualized ascending aorta and aortic arch. Conventional 3 vessel aortic branching pattern. The visualized proximal subclavian arteries are widely patent. RIGHT CAROTID SYSTEM: --Common carotid artery: Widely patent origin without common carotid artery dissection or aneurysm. --Internal carotid artery: No dissection, occlusion or aneurysm. No hemodynamically significant stenosis. --External carotid artery: No acute abnormality. LEFT CAROTID SYSTEM: --Common carotid artery: Widely patent origin without common carotid artery dissection or aneurysm. --Internal carotid artery: No dissection, occlusion or aneurysm. No hemodynamically significant stenosis. --External carotid artery: No acute abnormality. VERTEBRAL ARTERIES: Codominant configuration. There is severe stenosis of the left V1 segment. Otherwise, both vertebral arteries are normal. No dissection, occlusion or flow-limiting stenosis to the vertebrobasilar confluence. CTA HEAD FINDINGS ANTERIOR  CIRCULATION: --Intracranial internal carotid arteries: Normal. --Anterior cerebral arteries: Normal. Both A1 segments are present. Patent anterior communicating artery. --Middle cerebral arteries: Normal. --Posterior communicating arteries: Absent bilaterally. POSTERIOR CIRCULATION: --Basilar artery: Normal. --Posterior cerebral arteries: Normal. --Superior cerebellar arteries: Normal. --Inferior cerebellar arteries: Normal anterior and posterior inferior cerebellar arteries. VENOUS SINUSES: As permitted by contrast timing, patent. ANATOMIC VARIANTS: None DELAYED PHASE: Not performed. Review of the MIP images confirms the above findings. CT Brain Perfusion Findings: CBF (<30%) Volume: 25mL Perfusion (Tmax>6.0s) volume: 48mL Mismatch Volume: 41mL Infarction Location:n/a IMPRESSION: 1. No emergent large vessel occlusion. No infarct by CT perfusion criteria. 2. 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 Otherwise normal carotid and vertebral systems. Electronically Signed   By: Ulyses Jarred M.D.   On: 10/16/2018 23:26   Ct Head Wo Contrast  Result Date: 10/16/2018 CLINICAL DATA:  Altered mental status. Hypoglycemia. EXAM: CT HEAD WITHOUT CONTRAST CT CERVICAL SPINE WITHOUT CONTRAST TECHNIQUE: Multidetector CT imaging of the head and cervical spine was performed following the standard protocol without intravenous contrast. Multiplanar CT image reconstructions of the cervical spine were also generated. COMPARISON:  01/12/2018 FINDINGS: CT HEAD FINDINGS Brain: There is no evidence of acute infarct, intracranial hemorrhage, mass, midline shift, or extra-axial fluid collection. The ventricles and sulci are normal. Cerebral white matter hypodensities are nonspecific but compatible with mild chronic small vessel ischemic disease. 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 Vascular: No hyperdense vessel. Skull: No fracture or focal osseous lesion. Sinuses/Orbits: Paranasal sinuses and  mastoid air cells are clear. Unremarkable orbits. Other: None. CT CERVICAL SPINE FINDINGS Alignment: Mild cervical spine straightening. No listhesis. Skull base and vertebrae: No acute fracture or suspicious osseous lesion. Soft tissues and spinal canal: No prevertebral fluid or swelling. No visible canal hematoma. Disc levels:  Mild cervical spondylosis. Upper chest: Clear lung apices. Other: None. IMPRESSION: 1. No evidence of acute  intracranial abnormality. 2. Mild chronic small vessel ischemic disease. 3. No evidence of acute fracture or subluxation in the cervical spine. Electronically Signed   By: Logan Bores M.D.   On: 10/16/2018 20:21   Ct Angio Neck W And/or Wo Contrast  Result Date: 10/16/2018 CLINICAL DATA:  Altered mental status.  Found down. EXAM: CT ANGIOGRAPHY HEAD AND NECK CT PERFUSION BRAIN TECHNIQUE: Multidetector CT imaging of the head and neck was performed using the standard protocol during bolus administration of intravenous contrast. Multiplanar CT image reconstructions and MIPs were obtained to evaluate the vascular anatomy. Carotid stenosis measurements (when applicable) are obtained utilizing NASCET criteria, using the distal internal carotid diameter as the denominator. Multiphase CT imaging of the brain was performed following IV bolus contrast injection. Subsequent parametric perfusion maps were calculated using RAPID software. CONTRAST:  50 mL Isovue 370 COMPARISON:  Head CT 10/16/2018 FINDINGS: CTA NECK FINDINGS SKELETON: There is no bony spinal canal stenosis. No lytic or blastic lesion. OTHER NECK: Normal pharynx, larynx and major salivary glands. No cervical lymphadenopathy. Unremarkable thyroid gland. UPPER CHEST: No pneumothorax or pleural effusion. No nodules or masses. AORTIC ARCH: There is no calcific atherosclerosis of the aortic arch. There is no aneurysm, dissection or hemodynamically significant stenosis of the visualized ascending aorta and aortic arch. Conventional 3  vessel aortic branching pattern. The visualized proximal subclavian arteries are widely patent. RIGHT CAROTID SYSTEM: --Common carotid artery: Widely patent origin without common carotid artery dissection or aneurysm. --Internal carotid artery: No dissection, occlusion or aneurysm. No hemodynamically significant stenosis. --External carotid artery: No acute abnormality. LEFT CAROTID SYSTEM: --Common carotid artery: Widely patent origin without common carotid artery dissection or aneurysm. --Internal carotid artery: No dissection, occlusion or aneurysm. No hemodynamically significant stenosis. --External carotid artery: No acute abnormality. VERTEBRAL ARTERIES: Codominant configuration. There is severe stenosis of the left V1 segment. Otherwise, both vertebral arteries are normal. No dissection, occlusion or flow-limiting stenosis to the vertebrobasilar confluence. CTA HEAD FINDINGS ANTERIOR CIRCULATION: --Intracranial internal carotid arteries: Normal. --Anterior cerebral arteries: Normal. Both A1 segments are present. Patent anterior communicating artery. --Middle cerebral arteries: Normal. --Posterior communicating arteries: Absent bilaterally. POSTERIOR CIRCULATION: --Basilar artery: Normal. --Posterior cerebral arteries: Normal. --Superior cerebellar arteries: Normal. --Inferior cerebellar arteries: Normal anterior and posterior inferior cerebellar arteries. VENOUS SINUSES: As permitted by contrast timing, patent. ANATOMIC VARIANTS: None DELAYED PHASE: Not performed. Review of the MIP images confirms the above findings. CT Brain Perfusion Findings: CBF (<30%) Volume: 63mL Perfusion (Tmax>6.0s) volume: 42mL Mismatch Volume: 23mL Infarction Location:n/a IMPRESSION: 1. No emergent large vessel occlusion. No infarct by CT perfusion criteria. 2. 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 Otherwise normal carotid and vertebral systems. Electronically Signed   By: Ulyses Jarred M.D.   On: 10/16/2018 23:26     Ct Cervical Spine Wo Contrast  Result Date: 10/16/2018 CLINICAL DATA:  Altered mental status. Hypoglycemia. EXAM: CT HEAD WITHOUT CONTRAST CT CERVICAL SPINE WITHOUT CONTRAST TECHNIQUE: Multidetector CT imaging of the head and cervical spine was performed following the standard protocol without intravenous contrast. Multiplanar CT image reconstructions of the cervical spine were also generated. COMPARISON:  01/12/2018 FINDINGS: CT HEAD FINDINGS Brain: There is no evidence of acute infarct, intracranial hemorrhage, mass, midline shift, or extra-axial fluid collection. The ventricles and sulci are normal. Cerebral white matter hypodensities are nonspecific but compatible with mild chronic small vessel ischemic disease. 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 Vascular: No hyperdense vessel. Skull: No fracture or focal osseous  lesion. Sinuses/Orbits: Paranasal sinuses and mastoid air cells are clear. Unremarkable orbits. Other: None. CT CERVICAL SPINE FINDINGS Alignment: Mild cervical spine straightening. No listhesis. Skull base and vertebrae: No acute fracture or suspicious osseous lesion. Soft tissues and spinal canal: No prevertebral fluid or swelling. No visible canal hematoma. Disc levels:  Mild cervical spondylosis. Upper chest: Clear lung apices. Other: None. IMPRESSION: 1. No evidence of acute intracranial abnormality. 2. Mild chronic small vessel ischemic disease. 3. No evidence of acute fracture or subluxation in the cervical spine. Electronically Signed   By: Logan Bores M.D.   On: 10/16/2018 20:21   Ct Cerebral Perfusion W Contrast  Result Date: 10/16/2018 CLINICAL DATA:  Altered mental status.  Found down. EXAM: CT ANGIOGRAPHY HEAD AND NECK CT PERFUSION BRAIN TECHNIQUE: Multidetector CT imaging of the head and neck was performed using the standard protocol during bolus administration of intravenous contrast. Multiplanar CT image reconstructions and MIPs were obtained to  evaluate the vascular anatomy. Carotid stenosis measurements (when applicable) are obtained utilizing NASCET criteria, using the distal internal carotid diameter as the denominator. Multiphase CT imaging of the brain was performed following IV bolus contrast injection. Subsequent parametric perfusion maps were calculated using RAPID software. CONTRAST:  50 mL Isovue 370 COMPARISON:  Head CT 10/16/2018 FINDINGS: CTA NECK FINDINGS SKELETON: There is no bony spinal canal stenosis. No lytic or blastic lesion. OTHER NECK: Normal pharynx, larynx and major salivary glands. No cervical lymphadenopathy. Unremarkable thyroid gland. UPPER CHEST: No pneumothorax or pleural effusion. No nodules or masses. AORTIC ARCH: There is no calcific atherosclerosis of the aortic arch. There is no aneurysm, dissection or hemodynamically significant stenosis of the visualized ascending aorta and aortic arch. Conventional 3 vessel aortic branching pattern. The visualized proximal subclavian arteries are widely patent. RIGHT CAROTID SYSTEM: --Common carotid artery: Widely patent origin without common carotid artery dissection or aneurysm. --Internal carotid artery: No dissection, occlusion or aneurysm. No hemodynamically significant stenosis. --External carotid artery: No acute abnormality. LEFT CAROTID SYSTEM: --Common carotid artery: Widely patent origin without common carotid artery dissection or aneurysm. --Internal carotid artery: No dissection, occlusion or aneurysm. No hemodynamically significant stenosis. --External carotid artery: No acute abnormality. VERTEBRAL ARTERIES: Codominant configuration. There is severe stenosis of the left V1 segment. Otherwise, both vertebral arteries are normal. No dissection, occlusion or flow-limiting stenosis to the vertebrobasilar confluence. CTA HEAD FINDINGS ANTERIOR CIRCULATION: --Intracranial internal carotid arteries: Normal. --Anterior cerebral arteries: Normal. Both A1 segments are present.  Patent anterior communicating artery. --Middle cerebral arteries: Normal. --Posterior communicating arteries: Absent bilaterally. POSTERIOR CIRCULATION: --Basilar artery: Normal. --Posterior cerebral arteries: Normal. --Superior cerebellar arteries: Normal. --Inferior cerebellar arteries: Normal anterior and posterior inferior cerebellar arteries. VENOUS SINUSES: As permitted by contrast timing, patent. ANATOMIC VARIANTS: None DELAYED PHASE: Not performed. Review of the MIP images confirms the above findings. CT Brain Perfusion Findings: CBF (<30%) Volume: 54mL Perfusion (Tmax>6.0s) volume: 45mL Mismatch Volume: 57mL Infarction Location:n/a IMPRESSION: 1. No emergent large vessel occlusion. No infarct by CT perfusion criteria. 2. 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 Otherwise normal carotid and vertebral systems. Electronically Signed   By: Ulyses Jarred M.D.   On: 10/16/2018 23:26   Dg Chest Port 1 View  Result Date: 10/16/2018 CLINICAL DATA:  Altered level of consciousness EXAM: PORTABLE CHEST 1 VIEW COMPARISON:  06/22/2018 FINDINGS: Left-sided pacing device incompletely visualized lead. Cardiomegaly with central vascular congestion. No pleural effusion. No pneumothorax. IMPRESSION: Cardiomegaly with mild central congestion Electronically Signed   By: Madie Reno.D.  On: 10/16/2018 19:53    Procedures Procedures (including critical care time)  Medications Ordered in ED Medications  sodium chloride flush (NS) 0.9 % injection 3 mL (has no administration in time range)  sodium chloride flush (NS) 0.9 % injection 3 mL (has no administration in time range)  0.9 %  sodium chloride infusion (has no administration in time range)  iopamidol (ISOVUE-370) 76 % injection (has no administration in time range)  iopamidol (ISOVUE-370) 76 % injection 100 mL (40 mLs Intravenous Contrast Given 10/16/18 2306)  iopamidol (ISOVUE-370) 76 % injection 100 mL (50 mLs Intravenous Contrast Given  10/16/18 2308)     Initial Impression / Assessment and Plan / ED Course  I have reviewed the triage vital signs and the nursing notes.  Pertinent labs & imaging results that were available during my care of the patient were reviewed by me and considered in my medical decision making (see chart for details).  CRITICAL CARE Performed by: Isla Pence   Total critical care time: 30 minutes  Critical care time was exclusive of separately billable procedures and treating other patients.  Critical care was necessary to treat or prevent imminent or life-threatening deterioration.  Critical care was time spent personally by me on the following activities: development of treatment plan with patient and/or surrogate as well as nursing, discussions with consultants, evaluation of patient's response to treatment, examination of patient, obtaining history from patient or surrogate, ordering and performing treatments and interventions, ordering and review of laboratory studies, ordering and review of radiographic studies, pulse oximetry and re-evaluation of patient's condition.   AICD interrogated.  No abnormal event.  Pt d/w Dr. Cheral Marker who recommended CTA head and neck and perfusion study as pt can't get a MRI due to AICD.  CTA and perfusion study do not show any evidence of acute infarct.  He does have narrowing of left vertebral artery, but has normal carotid arteries.  Pt is still confused, but is speaking more.  Pt's difficulty speaking likely from hypoglycemic episode/possible seizure.  No seizure activity while here.  Pt d/w Dr. Blaine Hamper ([facility]) for admission.  Final Clinical Impressions(s) / ED Diagnoses   Final diagnoses:  Hypoglycemia  Metabolic encephalopathy    ED Discharge Orders    None       Isla Pence, MD 10/16/18 2351

## 2018-10-16 NOTE — ED Notes (Addendum)
Placed urinal in attempts to collect urine.  Pt was able to follow direction and lift hips.  Provider bedside

## 2018-10-16 NOTE — ED Triage Notes (Signed)
Pt arrives to ED from home with complaints of hypoglycemia and AMS since this evening when family found him on the couch next to a broken chair. EMS reports pt was last seen well by family at 0830 this morning. Pt is known diabetic, uses insulin. CBG 48 for EMS, 25g D10 given by EMS en route, brought glucose up to 145, no change in mentation, pt remains altered and nonverbal. Pt placed in position of comfort with bed locked and lowered, call bell in reach.

## 2018-10-16 NOTE — ED Notes (Signed)
Repositioned pt, appears confused.  Per visitors he asked 5 times w/in 60 seconds "what time is it?"

## 2018-10-17 DIAGNOSIS — G9341 Metabolic encephalopathy: Secondary | ICD-10-CM | POA: Diagnosis not present

## 2018-10-17 LAB — CBC
HEMATOCRIT: 39.9 % (ref 39.0–52.0)
Hemoglobin: 12 g/dL — ABNORMAL LOW (ref 13.0–17.0)
MCH: 23.2 pg — ABNORMAL LOW (ref 26.0–34.0)
MCHC: 30.1 g/dL (ref 30.0–36.0)
MCV: 77.2 fL — AB (ref 80.0–100.0)
NRBC: 0 % (ref 0.0–0.2)
PLATELETS: 260 10*3/uL (ref 150–400)
RBC: 5.17 MIL/uL (ref 4.22–5.81)
RDW: 15.3 % (ref 11.5–15.5)
WBC: 5.7 10*3/uL (ref 4.0–10.5)

## 2018-10-17 LAB — GLUCOSE, CAPILLARY
GLUCOSE-CAPILLARY: 158 mg/dL — AB (ref 70–99)
GLUCOSE-CAPILLARY: 156 mg/dL — AB (ref 70–99)
GLUCOSE-CAPILLARY: 130 mg/dL — AB (ref 70–99)
GLUCOSE-CAPILLARY: 212 mg/dL — AB (ref 70–99)
GLUCOSE-CAPILLARY: 214 mg/dL — AB (ref 70–99)
Glucose-Capillary: 162 mg/dL — ABNORMAL HIGH (ref 70–99)
Glucose-Capillary: 144 mg/dL — ABNORMAL HIGH (ref 70–99)
Glucose-Capillary: 148 mg/dL — ABNORMAL HIGH (ref 70–99)
Glucose-Capillary: 150 mg/dL — ABNORMAL HIGH (ref 70–99)
Glucose-Capillary: 157 mg/dL — ABNORMAL HIGH (ref 70–99)
Glucose-Capillary: 164 mg/dL — ABNORMAL HIGH (ref 70–99)
Glucose-Capillary: 169 mg/dL — ABNORMAL HIGH (ref 70–99)

## 2018-10-17 LAB — BASIC METABOLIC PANEL
Anion gap: 8 (ref 5–15)
BUN: 18 mg/dL (ref 6–20)
CALCIUM: 9.2 mg/dL (ref 8.9–10.3)
CHLORIDE: 108 mmol/L (ref 98–111)
CO2: 24 mmol/L (ref 22–32)
Creatinine, Ser: 1.24 mg/dL (ref 0.61–1.24)
GFR calc non Af Amer: >60 mL/min (ref >60)
Glucose, Bld: 158 mg/dL — ABNORMAL HIGH (ref 70–99)
Potassium: 3.2 mmol/L — ABNORMAL LOW (ref 3.5–5.1)
SODIUM: 140 mmol/L (ref 135–145)

## 2018-10-17 LAB — BRAIN NATRIURETIC PEPTIDE: B NATRIURETIC PEPTIDE 5: 15.3 pg/mL (ref 0.0–100.0)

## 2018-10-17 LAB — CBG MONITORING, ED: Glucose-Capillary: 101 mg/dL — ABNORMAL HIGH (ref 70–99)

## 2018-10-17 MED ORDER — GABAPENTIN 400 MG PO CAPS
400.0000 mg | ORAL_CAPSULE | Freq: Three times a day (TID) | ORAL | Status: DC
  Administered 2018-10-17 – 2018-10-18 (×4): 400 mg via ORAL
  Filled 2018-10-17 (×4): qty 1

## 2018-10-17 MED ORDER — FUROSEMIDE 40 MG PO TABS
40.0000 mg | ORAL_TABLET | Freq: Every day | ORAL | Status: DC
  Administered 2018-10-17 – 2018-10-18 (×2): 40 mg via ORAL
  Filled 2018-10-17 (×2): qty 1

## 2018-10-17 MED ORDER — CYCLOBENZAPRINE HCL 10 MG PO TABS
5.0000 mg | ORAL_TABLET | Freq: Every day | ORAL | Status: DC
  Administered 2018-10-17: 5 mg via ORAL
  Filled 2018-10-17: qty 1

## 2018-10-17 MED ORDER — CARVEDILOL 12.5 MG PO TABS
12.5000 mg | ORAL_TABLET | Freq: Two times a day (BID) | ORAL | Status: DC
  Administered 2018-10-17 – 2018-10-18 (×3): 12.5 mg via ORAL
  Filled 2018-10-17 (×3): qty 1

## 2018-10-17 MED ORDER — SPIRONOLACTONE 25 MG PO TABS
25.0000 mg | ORAL_TABLET | Freq: Every day | ORAL | Status: DC
  Administered 2018-10-17 – 2018-10-18 (×2): 25 mg via ORAL
  Filled 2018-10-17 (×2): qty 1

## 2018-10-17 MED ORDER — HYDRALAZINE HCL 20 MG/ML IJ SOLN: 5.0000 mg | INTRAMUSCULAR | Status: DC | PRN

## 2018-10-17 MED ORDER — ACETAMINOPHEN 325 MG PO TABS
650.0000 mg | ORAL_TABLET | Freq: Four times a day (QID) | ORAL | Status: DC | PRN
  Administered 2018-10-17: 650 mg via ORAL
  Filled 2018-10-17: qty 2

## 2018-10-17 MED ORDER — ENOXAPARIN SODIUM 40 MG/0.4ML ~~LOC~~ SOLN
40.0000 mg | SUBCUTANEOUS | Status: DC
  Administered 2018-10-17 – 2018-10-18 (×2): 40 mg via SUBCUTANEOUS
  Filled 2018-10-17 (×2): qty 0.4

## 2018-10-17 MED ORDER — ACETAMINOPHEN 500 MG PO TABS: 1000.0000 mg | ORAL_TABLET | Freq: Four times a day (QID) | ORAL | Status: DC | PRN

## 2018-10-17 MED ORDER — ALBUTEROL SULFATE (2.5 MG/3ML) 0.083% IN NEBU: 2.5000 mg | INHALATION_SOLUTION | Freq: Four times a day (QID) | RESPIRATORY_TRACT | Status: DC | PRN

## 2018-10-17 MED ORDER — ASPIRIN 300 MG RE SUPP
150.0000 mg | Freq: Every day | RECTAL | Status: DC
  Administered 2018-10-17: 150 mg via RECTAL
  Filled 2018-10-17: qty 1

## 2018-10-17 MED ORDER — ONDANSETRON HCL 4 MG PO TABS: 4.0000 mg | ORAL_TABLET | Freq: Four times a day (QID) | ORAL | Status: DC | PRN

## 2018-10-17 MED ORDER — SACUBITRIL-VALSARTAN 49-51 MG PO TABS
1.0000 | ORAL_TABLET | Freq: Two times a day (BID) | ORAL | Status: DC
  Administered 2018-10-17 – 2018-10-18 (×2): 1 via ORAL
  Filled 2018-10-17 (×3): qty 1

## 2018-10-17 MED ORDER — ACETAMINOPHEN 650 MG RE SUPP: 650.0000 mg | Freq: Four times a day (QID) | RECTAL | Status: DC | PRN

## 2018-10-17 MED ORDER — FERROUS SULFATE 325 (65 FE) MG PO TABS
325.0000 mg | ORAL_TABLET | Freq: Two times a day (BID) | ORAL | Status: DC
  Administered 2018-10-17 – 2018-10-18 (×3): 325 mg via ORAL
  Filled 2018-10-17 (×3): qty 1

## 2018-10-17 MED ORDER — ALBUTEROL SULFATE HFA 108 (90 BASE) MCG/ACT IN AERS: 1.0000 | INHALATION_SPRAY | Freq: Four times a day (QID) | RESPIRATORY_TRACT | Status: DC | PRN

## 2018-10-17 MED ORDER — ONDANSETRON HCL 4 MG/2ML IJ SOLN: 4.0000 mg | Freq: Four times a day (QID) | INTRAMUSCULAR | Status: DC | PRN

## 2018-10-17 MED ORDER — ASPIRIN EC 81 MG PO TBEC
81.0000 mg | DELAYED_RELEASE_TABLET | Freq: Every day | ORAL | Status: DC
  Administered 2018-10-17 – 2018-10-18 (×2): 81 mg via ORAL
  Filled 2018-10-17 (×2): qty 1

## 2018-10-17 MED ORDER — INSULIN ASPART 100 UNIT/ML ~~LOC~~ SOLN
0.0000 [IU] | Freq: Every day | SUBCUTANEOUS | Status: DC
  Administered 2018-10-17: 2 [IU] via SUBCUTANEOUS

## 2018-10-17 MED ORDER — INSULIN ASPART 100 UNIT/ML ~~LOC~~ SOLN
2.0000 [IU] | Freq: Three times a day (TID) | SUBCUTANEOUS | Status: DC
  Administered 2018-10-18 (×3): 2 [IU] via SUBCUTANEOUS

## 2018-10-17 MED ORDER — POTASSIUM CHLORIDE ER 10 MEQ PO TBCR
10.0000 meq | EXTENDED_RELEASE_TABLET | Freq: Every day | ORAL | Status: DC
  Administered 2018-10-17 – 2018-10-18 (×2): 10 meq via ORAL
  Filled 2018-10-17 (×4): qty 1

## 2018-10-17 MED ORDER — ROSUVASTATIN CALCIUM 20 MG PO TABS
40.0000 mg | ORAL_TABLET | Freq: Every day | ORAL | Status: DC
  Administered 2018-10-17: 40 mg via ORAL
  Filled 2018-10-17: qty 2

## 2018-10-17 NOTE — Progress Notes (Signed)
Patient Demographics:    Jeffrey [name], is a 52 y.o. male, DOB - 07-21-1966, QAS:[id number]  Admit date - 10/16/2018   Admitting Physician Ivor Costa, MD  Outpatient Primary MD for the patient is Charlott Rakes, MD  LOS - 0   Chief Complaint  Patient presents with  . Hypoglycemia        Subjective:    Jeffrey [name] today has no fevers, no emesis,  No chest pain,  S/o Ms [name] at  Bedside, less sleepy   Assessment  & Plan :    Principal Problem:   Acute metabolic encephalopathy Active Problems:   Essential hypertension   Uncontrolled type 2 diabetes mellitus with hyperglycemia, with long-term current use of insulin (HCC)   Chronic combined systolic (congestive) and diastolic (congestive) heart failure (HCC)   NICM (nonischemic cardiomyopathy) (HCC)   Coronary artery disease involving native coronary artery of native heart without angina pectoris   Hyperlipidemia   S/P ICD (internal cardiac defibrillator) procedure   Hypoglycemia  Brief Summary  52 y.o. male with medical history significant of hypertension, hyperlipidemia, diabetes mellitus, CAD, CHF with EF 20%, nonischemic cardiomyopathy, AICD placement admitted 10/16/18 with altered mental status/metabolic encephalopathy secondary to persistent hypoglycemia  Plan:-  1)Acute metabolic Encephalopathy--- secondary to persistent hypoglycemia, patient apparently took more insulin than prescribed accidentally, hold Lantus insulin 45 units twice daily, sliding scale for now, patient becoming more awake more talkative but not quite back to baseline.... Continues to have intermittent confusion some lethargy  2)DM2--- last A1c was 15.5 about 4 months ago, patient blood sugars are erratic, he is noncompliant takes as much insulin as he figures he should take rather than following the sliding scale... He had diabetic education, post discharge patient  will need visiting home nurse to help improve medication compliance  3)Generalized weakness/debility--- fall risk, most likely due to #1 above, get PT eval  4)HFrEF/Non-Ischemic cardiomyopathy status post AICD placement-----patient with history of combined diastolic and systolic dysfunction CHF, EF in the 20% range, okay to restart Lasix and Aldactone as oral intake is resumed, continue Entresto, patient is currently euvolemic, no evidence of volume overload , continue aspirin and Crestor  Disposition/Need for in-Hospital Stay- patient unable to be discharged at this time due to altered mentation/metabolic encephalopathy due to prolonged hypoglycemia.... Patient still sleepy, gait is unsteady, oral intake unreliable  Code Status : full  Disposition Plan  : Possible discharge on 10/18/2018 if more awake and eating better  Consults  :  *Diabetic coordinator  DVT Prophylaxis  :  Lovenox   Lab Results  Component Value Date   PLT 260 10/17/2018    Inpatient Medications  Scheduled Meds: . aspirin  150 mg Rectal Daily  . enoxaparin (LOVENOX) injection  40 mg Subcutaneous Q24H  . sodium chloride flush  3 mL Intravenous Q12H   Continuous Infusions: . sodium chloride 250 mL (10/17/18 0728)   PRN Meds:.sodium chloride, acetaminophen **OR** acetaminophen, dextrose, hydrALAZINE, ondansetron **OR** ondansetron (ZOFRAN) IV, sodium chloride flush    Anti-infectives (From admission, onward)   None        Objective:   Vitals:   10/17/18 0130 10/17/18 0347 10/17/18 0756 10/17/18 1212  BP: (!) 155/89 130/80 131/75 127/83  Pulse: 100 (!) 105 95 86  Resp: 18 18 18 18   Temp: 99.8 F (37.7 C) 98.8 F (37.1 C) 98.9 F (37.2 C) 98.6 F (37 C)  TempSrc: Oral Oral Oral Oral  SpO2: 96% 97% 96% 97%  Weight: 94.7 kg     Height:        Wt Readings from Last 3 Encounters:  10/17/18 94.7 kg  06/22/18 86.2 kg  04/12/18 88.9 kg     Intake/Output Summary (Last 24 hours) at 10/17/2018  1515 Last data filed at 10/17/2018 1500 Gross per 24 hour  Intake 294.84 ml  Output -  Net 294.84 ml     Physical Exam Patient is examined daily including today on 10/17/18 , exams remain the same as of yesterday except that has changed   Gen:- Awake Alert,  In no apparent distress  HEENT:- [place].AT, No sclera icterus Neck-Supple Neck,No JVD,.  Lungs-  CTAB , fair air movement CV- S1, S2 normal Abd-  +ve B.Sounds, Abd Soft, No tenderness,    Extremity/Skin:- No  Edema, amputation of right foot toes Psych-affect is flat, disoriented, less sleepy Neuro-no new focal deficits, no tremors, unsteady gait   Data Review:   Micro Results No results found for this or any previous visit (from the past 240 hour(s)).  Radiology Reports Ct Angio Head W Or Wo Contrast  Result Date: 10/16/2018 CLINICAL DATA:  Altered mental status.  Found down. EXAM: CT ANGIOGRAPHY HEAD AND NECK CT PERFUSION BRAIN TECHNIQUE: Multidetector CT imaging of the head and neck was performed using the standard protocol during bolus administration of intravenous contrast. Multiplanar CT image reconstructions and MIPs were obtained to evaluate the vascular anatomy. Carotid stenosis measurements (when applicable) are obtained utilizing NASCET criteria, using the distal internal carotid diameter as the denominator. Multiphase CT imaging of the brain was performed following IV bolus contrast injection. Subsequent parametric perfusion maps were calculated using RAPID software. CONTRAST:  50 mL Isovue 370 COMPARISON:  Head CT 10/16/2018 FINDINGS: CTA NECK FINDINGS SKELETON: There is no bony spinal canal stenosis. No lytic or blastic lesion. OTHER NECK: Normal pharynx, larynx and major salivary glands. No cervical lymphadenopathy. Unremarkable thyroid gland. UPPER CHEST: No pneumothorax or pleural effusion. No nodules or masses. AORTIC ARCH: There is no calcific atherosclerosis of the aortic arch. There is no aneurysm, dissection or  hemodynamically significant stenosis of the visualized ascending aorta and aortic arch. Conventional 3 vessel aortic branching pattern. The visualized proximal subclavian arteries are widely patent. RIGHT CAROTID SYSTEM: --Common carotid artery: Widely patent origin without common carotid artery dissection or aneurysm. --Internal carotid artery: No dissection, occlusion or aneurysm. No hemodynamically significant stenosis. --External carotid artery: No acute abnormality. LEFT CAROTID SYSTEM: --Common carotid artery: Widely patent origin without common carotid artery dissection or aneurysm. --Internal carotid artery: No dissection, occlusion or aneurysm. No hemodynamically significant stenosis. --External carotid artery: No acute abnormality. VERTEBRAL ARTERIES: Codominant configuration. There is severe stenosis of the left V1 segment. Otherwise, both vertebral arteries are normal. No dissection, occlusion or flow-limiting stenosis to the vertebrobasilar confluence. CTA HEAD FINDINGS ANTERIOR CIRCULATION: --Intracranial internal carotid arteries: Normal. --Anterior cerebral arteries: Normal. Both A1 segments are present. Patent anterior communicating artery. --Middle cerebral arteries: Normal. --Posterior communicating arteries: Absent bilaterally. POSTERIOR CIRCULATION: --Basilar artery: Normal. --Posterior cerebral arteries: Normal. --Superior cerebellar arteries: Normal. --Inferior cerebellar arteries: Normal anterior and posterior inferior cerebellar arteries. VENOUS SINUSES: As permitted by contrast timing, patent. ANATOMIC VARIANTS: None DELAYED PHASE: Not performed. Review of the MIP images confirms the above findings. CT Brain Perfusion Findings:  CBF (<30%) Volume: 37mL Perfusion (Tmax>6.0s) volume: 79mL Mismatch Volume: 31mL Infarction Location:n/a IMPRESSION: 1. No emergent large vessel occlusion. No infarct by CT perfusion criteria. 2. 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 Otherwise normal  carotid and vertebral systems. Electronically Signed   By: Ulyses Jarred M.D.   On: 10/16/2018 23:26   Ct Head Wo Contrast  Result Date: 10/16/2018 CLINICAL DATA:  Altered mental status. Hypoglycemia. EXAM: CT HEAD WITHOUT CONTRAST CT CERVICAL SPINE WITHOUT CONTRAST TECHNIQUE: Multidetector CT imaging of the head and cervical spine was performed following the standard protocol without intravenous contrast. Multiplanar CT image reconstructions of the cervical spine were also generated. COMPARISON:  01/12/2018 FINDINGS: CT HEAD FINDINGS Brain: There is no evidence of acute infarct, intracranial hemorrhage, mass, midline shift, or extra-axial fluid collection. The ventricles and sulci are normal. Cerebral white matter hypodensities are nonspecific but compatible with mild chronic small vessel ischemic disease. 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 Vascular: No hyperdense vessel. Skull: No fracture or focal osseous lesion. Sinuses/Orbits: Paranasal sinuses and mastoid air cells are clear. Unremarkable orbits. Other: None. CT CERVICAL SPINE FINDINGS Alignment: Mild cervical spine straightening. No listhesis. Skull base and vertebrae: No acute fracture or suspicious osseous lesion. Soft tissues and spinal canal: No prevertebral fluid or swelling. No visible canal hematoma. Disc levels:  Mild cervical spondylosis. Upper chest: Clear lung apices. Other: None. IMPRESSION: 1. No evidence of acute intracranial abnormality. 2. Mild chronic small vessel ischemic disease. 3. No evidence of acute fracture or subluxation in the cervical spine. Electronically Signed   By: Logan Bores M.D.   On: 10/16/2018 20:21   Ct Angio Neck W And/or Wo Contrast  Result Date: 10/16/2018 CLINICAL DATA:  Altered mental status.  Found down. EXAM: CT ANGIOGRAPHY HEAD AND NECK CT PERFUSION BRAIN TECHNIQUE: Multidetector CT imaging of the head and neck was performed using the standard protocol during bolus  administration of intravenous contrast. Multiplanar CT image reconstructions and MIPs were obtained to evaluate the vascular anatomy. Carotid stenosis measurements (when applicable) are obtained utilizing NASCET criteria, using the distal internal carotid diameter as the denominator. Multiphase CT imaging of the brain was performed following IV bolus contrast injection. Subsequent parametric perfusion maps were calculated using RAPID software. CONTRAST:  50 mL Isovue 370 COMPARISON:  Head CT 10/16/2018 FINDINGS: CTA NECK FINDINGS SKELETON: There is no bony spinal canal stenosis. No lytic or blastic lesion. OTHER NECK: Normal pharynx, larynx and major salivary glands. No cervical lymphadenopathy. Unremarkable thyroid gland. UPPER CHEST: No pneumothorax or pleural effusion. No nodules or masses. AORTIC ARCH: There is no calcific atherosclerosis of the aortic arch. There is no aneurysm, dissection or hemodynamically significant stenosis of the visualized ascending aorta and aortic arch. Conventional 3 vessel aortic branching pattern. The visualized proximal subclavian arteries are widely patent. RIGHT CAROTID SYSTEM: --Common carotid artery: Widely patent origin without common carotid artery dissection or aneurysm. --Internal carotid artery: No dissection, occlusion or aneurysm. No hemodynamically significant stenosis. --External carotid artery: No acute abnormality. LEFT CAROTID SYSTEM: --Common carotid artery: Widely patent origin without common carotid artery dissection or aneurysm. --Internal carotid artery: No dissection, occlusion or aneurysm. No hemodynamically significant stenosis. --External carotid artery: No acute abnormality. VERTEBRAL ARTERIES: Codominant configuration. There is severe stenosis of the left V1 segment. Otherwise, both vertebral arteries are normal. No dissection, occlusion or flow-limiting stenosis to the vertebrobasilar confluence. CTA HEAD FINDINGS ANTERIOR CIRCULATION: --Intracranial  internal carotid arteries: Normal. --Anterior cerebral arteries: Normal. Both  A1 segments are present. Patent anterior communicating artery. --Middle cerebral arteries: Normal. --Posterior communicating arteries: Absent bilaterally. POSTERIOR CIRCULATION: --Basilar artery: Normal. --Posterior cerebral arteries: Normal. --Superior cerebellar arteries: Normal. --Inferior cerebellar arteries: Normal anterior and posterior inferior cerebellar arteries. VENOUS SINUSES: As permitted by contrast timing, patent. ANATOMIC VARIANTS: None DELAYED PHASE: Not performed. Review of the MIP images confirms the above findings. CT Brain Perfusion Findings: CBF (<30%) Volume: 40mL Perfusion (Tmax>6.0s) volume: 68mL Mismatch Volume: 42mL Infarction Location:n/a IMPRESSION: 1. No emergent large vessel occlusion. No infarct by CT perfusion criteria. 2. 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 Otherwise normal carotid and vertebral systems. Electronically Signed   By: Ulyses Jarred M.D.   On: 10/16/2018 23:26   Ct Cervical Spine Wo Contrast  Result Date: 10/16/2018 CLINICAL DATA:  Altered mental status. Hypoglycemia. EXAM: CT HEAD WITHOUT CONTRAST CT CERVICAL SPINE WITHOUT CONTRAST TECHNIQUE: Multidetector CT imaging of the head and cervical spine was performed following the standard protocol without intravenous contrast. Multiplanar CT image reconstructions of the cervical spine were also generated. COMPARISON:  01/12/2018 FINDINGS: CT HEAD FINDINGS Brain: There is no evidence of acute infarct, intracranial hemorrhage, mass, midline shift, or extra-axial fluid collection. The ventricles and sulci are normal. Cerebral white matter hypodensities are nonspecific but compatible with mild chronic small vessel ischemic disease. 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 Vascular: No hyperdense vessel. Skull: No fracture or focal osseous lesion. Sinuses/Orbits: Paranasal sinuses and mastoid air cells  are clear. Unremarkable orbits. Other: None. CT CERVICAL SPINE FINDINGS Alignment: Mild cervical spine straightening. No listhesis. Skull base and vertebrae: No acute fracture or suspicious osseous lesion. Soft tissues and spinal canal: No prevertebral fluid or swelling. No visible canal hematoma. Disc levels:  Mild cervical spondylosis. Upper chest: Clear lung apices. Other: None. IMPRESSION: 1. No evidence of acute intracranial abnormality. 2. Mild chronic small vessel ischemic disease. 3. No evidence of acute fracture or subluxation in the cervical spine. Electronically Signed   By: Logan Bores M.D.   On: 10/16/2018 20:21   Ct Cerebral Perfusion W Contrast  Result Date: 10/16/2018 CLINICAL DATA:  Altered mental status.  Found down. EXAM: CT ANGIOGRAPHY HEAD AND NECK CT PERFUSION BRAIN TECHNIQUE: Multidetector CT imaging of the head and neck was performed using the standard protocol during bolus administration of intravenous contrast. Multiplanar CT image reconstructions and MIPs were obtained to evaluate the vascular anatomy. Carotid stenosis measurements (when applicable) are obtained utilizing NASCET criteria, using the distal internal carotid diameter as the denominator. Multiphase CT imaging of the brain was performed following IV bolus contrast injection. Subsequent parametric perfusion maps were calculated using RAPID software. CONTRAST:  50 mL Isovue 370 COMPARISON:  Head CT 10/16/2018 FINDINGS: CTA NECK FINDINGS SKELETON: There is no bony spinal canal stenosis. No lytic or blastic lesion. OTHER NECK: Normal pharynx, larynx and major salivary glands. No cervical lymphadenopathy. Unremarkable thyroid gland. UPPER CHEST: No pneumothorax or pleural effusion. No nodules or masses. AORTIC ARCH: There is no calcific atherosclerosis of the aortic arch. There is no aneurysm, dissection or hemodynamically significant stenosis of the visualized ascending aorta and aortic arch. Conventional 3 vessel aortic  branching pattern. The visualized proximal subclavian arteries are widely patent. RIGHT CAROTID SYSTEM: --Common carotid artery: Widely patent origin without common carotid artery dissection or aneurysm. --Internal carotid artery: No dissection, occlusion or aneurysm. No hemodynamically significant stenosis. --External carotid artery: No acute abnormality. LEFT CAROTID SYSTEM: --Common carotid artery: Widely patent origin without common carotid  artery dissection or aneurysm. --Internal carotid artery: No dissection, occlusion or aneurysm. No hemodynamically significant stenosis. --External carotid artery: No acute abnormality. VERTEBRAL ARTERIES: Codominant configuration. There is severe stenosis of the left V1 segment. Otherwise, both vertebral arteries are normal. No dissection, occlusion or flow-limiting stenosis to the vertebrobasilar confluence. CTA HEAD FINDINGS ANTERIOR CIRCULATION: --Intracranial internal carotid arteries: Normal. --Anterior cerebral arteries: Normal. Both A1 segments are present. Patent anterior communicating artery. --Middle cerebral arteries: Normal. --Posterior communicating arteries: Absent bilaterally. POSTERIOR CIRCULATION: --Basilar artery: Normal. --Posterior cerebral arteries: Normal. --Superior cerebellar arteries: Normal. --Inferior cerebellar arteries: Normal anterior and posterior inferior cerebellar arteries. VENOUS SINUSES: As permitted by contrast timing, patent. ANATOMIC VARIANTS: None DELAYED PHASE: Not performed. Review of the MIP images confirms the above findings. CT Brain Perfusion Findings: CBF (<30%) Volume: 56mL Perfusion (Tmax>6.0s) volume: 71mL Mismatch Volume: 56mL Infarction Location:n/a IMPRESSION: 1. No emergent large vessel occlusion. No infarct by CT perfusion criteria. 2. 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 Otherwise normal carotid and vertebral systems. Electronically Signed   By: Ulyses Jarred M.D.   On: 10/16/2018 23:26   Dg Chest  Port 1 View  Result Date: 10/16/2018 CLINICAL DATA:  Altered level of consciousness EXAM: PORTABLE CHEST 1 VIEW COMPARISON:  06/22/2018 FINDINGS: Left-sided pacing device incompletely visualized lead. Cardiomegaly with central vascular congestion. No pleural effusion. No pneumothorax. IMPRESSION: Cardiomegaly with mild central congestion Electronically Signed   By: Donavan Foil M.D.   On: 10/16/2018 19:53     CBC Recent Labs  Lab 10/16/18 1903 10/16/18 1912 10/17/18 0352  WBC 6.5  --  5.7  HGB 12.9* 14.6 12.0*  HCT 44.1 43.0 39.9  PLT 271  --  260  MCV 79.3*  --  77.2*  MCH 23.2*  --  23.2*  MCHC 29.3*  --  30.1  RDW 15.3  --  15.3    Chemistries  Recent Labs  Lab 10/16/18 1903 10/16/18 1912 10/17/18 0352  NA 138 141 140  K 3.6 3.7 3.2*  CL 103 103 108  CO2 25  --  24  GLUCOSE 132* 127* 158*  BUN 18 20 18   CREATININE 1.10 0.90 1.24  CALCIUM 9.5  --  9.2  AST 33  --   --   ALT 18  --   --   ALKPHOS 68  --   --   BILITOT 0.5  --   --    ------------------------------------------------------------------------------------------------------------------ No results for input(s): CHOL, HDL, LDLCALC, TRIG, CHOLHDL, LDLDIRECT in the last 72 hours.  Lab Results  Component Value Date   HGBA1C >15.5 (H) 06/22/2018   ------------------------------------------------------------------------------------------------------------------ No results for input(s): TSH, T4TOTAL, T3FREE, THYROIDAB in the last 72 hours.  Invalid input(s): FREET3 ------------------------------------------------------------------------------------------------------------------ No results for input(s): VITAMINB12, FOLATE, FERRITIN, TIBC, IRON, RETICCTPCT in the last 72 hours.  Coagulation profile No results for input(s): INR, PROTIME in the last 168 hours.  No results for input(s): DDIMER in the last 72 hours.  Cardiac Enzymes No results for input(s): CKMB, TROPONINI, MYOGLOBIN in the last 168  hours.  Invalid input(s): CK ------------------------------------------------------------------------------------------------------------------    Component Value Date/Time   BNP 15.3 10/17/2018 0015   BNP 99.6 06/24/2016 1615   Aikeem Lilley M.D on 10/17/2018 at 3:15 PM  Pager---[phone] Go to [email or web address] - password [email or web address] for contact info  [facility] - Office  [phone]

## 2018-10-17 NOTE — Evaluation (Signed)
Physical Therapy Evaluation Patient Details Name: Jeffrey [name] MRN: [id number] DOB: [birth year] Today's Date: 10/17/2018   History of Present Illness  52 y.o.malewith medical history significant ofhypertension, hyperlipidemia, diabetes mellitus, CAD, CHF with EF 20%, nonischemic cardiomyopathy, AICD placement admitted 10/16/18 with altered mental status/metabolic encephalopathy secondary to persistent hypoglycemia  Clinical Impression  Pt admitted with/for encephalopathy as described above.  Presently, mentation is improved, but not to baseline and pt is unsteady with ambulation needing light minimal assist.  Discussed with family the hope that he can get more PRN assist/supervision until his is back to baseline..  Pt currently limited functionally due to the problems listed. ( See problems list.)   Pt will benefit from PT to maximize function and safety in order to get ready for next venue listed below.     Follow Up Recommendations Home health PT;Supervision - Intermittent;Supervision/Assistance - 24 hour(up to 24 hours as needed)    Equipment Recommendations  Other (comment)(TBA, may need cane)    Recommendations for Other Services       Precautions / Restrictions Precautions Precautions: Fall      Mobility  Bed Mobility Overal bed mobility: Modified Independent             General bed mobility comments: extra time  Transfers Overall transfer level: Needs assistance   Transfers: Sit to/from Stand Sit to Stand: Min assist         General transfer comment: assisted on 3rd attempt to get up from the bed.  Ambulation/Gait Ambulation/Gait assistance: Min assist Gait Distance (Feet): 70 Feet Assistive device: None Gait Pattern/deviations: Step-through pattern   Gait velocity interpretation: <1.31 ft/sec, indicative of household ambulator General Gait Details: Unsteady overall, guarded, narrowed gait.  Stairs            Wheelchair Mobility     Modified Rankin (Stroke Patients Only)       Balance Overall balance assessment: Needs assistance Sitting-balance support: No upper extremity supported Sitting balance-Leahy Scale: Fair       Standing balance-Leahy Scale: Fair Standing balance comment: fair at best                             Pertinent Vitals/Pain Pain Assessment: No/denies pain    Home Living Family/patient expects to be discharged to:: Private residence Living Arrangements: Spouse/significant other Available Help at Discharge: Family;Available PRN/intermittently;Friend(s) Type of Home: Apartment Home Access: Elevator     Home Layout: One level Home Equipment: None(access to RW)      Prior Function Level of Independence: Independent         Comments: gets places by family and the bus.'     Hand Dominance        Extremity/Trunk Assessment        Lower Extremity Assessment Lower Extremity Assessment: Overall WFL for tasks assessed(mild weakness bilaterally)       Communication   Communication: No difficulties  Cognition Arousal/Alertness: Awake/alert Behavior During Therapy: Flat affect;WFL for tasks assessed/performed Overall Cognitive Status: Impaired/Different from baseline(NT formally) Area of Impairment: Attention;Following commands;Safety/judgement;Problem solving                   Current Attention Level: Sustained   Following Commands: Follows one step commands consistently;Follows one step commands with increased time Safety/Judgement: Decreased awareness of safety;Decreased awareness of deficits   Problem Solving: Slow processing        General Comments      Exercises  Assessment/Plan    PT Assessment Patient needs continued PT services  PT Problem List Decreased strength;Decreased activity tolerance;Decreased balance;Decreased mobility;Decreased knowledge of use of DME       PT Treatment Interventions DME instruction;Gait  training;Stair training;Functional mobility training;Therapeutic activities;Balance training;Patient/family education    PT Goals (Current goals can be found in the Care Plan section)  Acute Rehab PT Goals Patient Stated Goal: go home PT Goal Formulation: With patient Time For Goal Achievement: 10/24/18 Potential to Achieve Goals: Good    Frequency Min 3X/week   Barriers to discharge        Co-evaluation               AM-PAC PT "6 Clicks" Daily Activity  Outcome Measure Difficulty turning over in bed (including adjusting bedclothes, sheets and blankets)?: None Difficulty moving from lying on back to sitting on the side of the bed? : None Difficulty sitting down on and standing up from a chair with arms (e.g., wheelchair, bedside commode, etc,.)?: Unable Help needed moving to and from a bed to chair (including a wheelchair)?: A Little Help needed walking in hospital room?: A Little Help needed climbing 3-5 steps with a railing? : A Little 6 Click Score: 18    End of Session   Activity Tolerance: Patient tolerated treatment well Patient left: in bed;with call bell/phone within reach;with family/visitor present Nurse Communication: Mobility status PT Visit Diagnosis: Unsteadiness on feet (R26.81);Muscle weakness (generalized) (M62.81)    Time: 2111-7356 PT Time Calculation (min) (ACUTE ONLY): 28 min   Charges:   PT Evaluation $PT Eval Moderate Complexity: 1 Mod PT Treatments $Gait Training: 8-22 mins        10/17/2018  Donnella Sham, PT Acute Rehabilitation Services [phone]  (pager) [phone]  (office)  Tessie Fass Reubin Bushnell 10/17/2018, 5:19 PM

## 2018-10-17 NOTE — ED Notes (Signed)
Per provider, he cancelled the swallow screen and made pt NPO.  Pt needs to have CBGs every hour, per provider.

## 2018-10-18 DIAGNOSIS — G9341 Metabolic encephalopathy: Secondary | ICD-10-CM | POA: Diagnosis not present

## 2018-10-18 LAB — GLUCOSE, CAPILLARY
GLUCOSE-CAPILLARY: 248 mg/dL — AB (ref 70–99)
GLUCOSE-CAPILLARY: 226 mg/dL — AB (ref 70–99)
GLUCOSE-CAPILLARY: 214 mg/dL — AB (ref 70–99)
Glucose-Capillary: 260 mg/dL — ABNORMAL HIGH (ref 70–99)

## 2018-10-18 MED ORDER — TRAZODONE HCL 100 MG PO TABS: 100.0000 mg | ORAL_TABLET | Freq: Every day | ORAL | 2 refills | Status: DC

## 2018-10-18 MED ORDER — INSULIN GLARGINE 100 UNIT/ML SOLOSTAR PEN: 40.0000 [IU] | PEN_INJECTOR | Freq: Two times a day (BID) | SUBCUTANEOUS | 3 refills | Status: DC

## 2018-10-18 MED FILL — traZODone HCL 100 MG TABS: 100 | 30 days supply | Qty: 30 | Fill #0

## 2018-10-18 NOTE — Discharge Instructions (Signed)
1)Very low-salt diet advised 2)Weigh yourself daily, call if you gain more than 3 pounds in 1 day or more than 5 pounds in 1 week as your diuretic medications may need to be adjusted 3)Limit your Fluid  intake to no more than 60 ounces (1.8 Liters) per day 4)You have-- Poorly controlled diabetes with erratic blood sugars -check your blood sugar before each meal 3 times a day and also before bedtime keep a diary of your blood sugars and take this diary/record with you when you see your primary care physician Dr. Jarold Song on 10/24/2018 5)Take Lantus insulin and NovoLog insulin per sliding scale as advised

## 2018-10-18 NOTE — Progress Notes (Signed)
SLP Cancellation Note  Patient Details Name: FORNEY KLEINPETER MRN: [id number] DOB: [birth year]   Cancelled treatment:       Reason Eval/Treat Not Completed: Other (comment). Orders for BSE discontinued. Please reconsult if needs arise. Thank you!  Celia B. Quentin Ore [facility], CCC-SLP Speech Language Pathologist [phone]  Shonna Chock 10/18/2018, 10:13 AM

## 2018-10-18 NOTE — Progress Notes (Signed)
Inpatient Diabetes Program Recommendations  AACE/ADA: New Consensus Statement on Inpatient Glycemic Control (2015)  Target Ranges:  Prepandial:   less than 140 mg/dL      Peak postprandial:   less than 180 mg/dL (1-2 hours)      Critically ill patients:  140 - 180 mg/dL   Lab Results  Component Value Date   GLUCAP 226 (H) 10/18/2018   HGBA1C >15.5 (H) 06/22/2018    Review of Glycemic Control  Spoke with family regarding pt's glycemic control. GF states she had been checking blood sugars and giving insulin to pt, until pt decided he would do it himself. GF states pt would take his insulin and meter in bathroom, lock the door and give the insulin. States he was giving much more than what was ordered. Pt had hypoglycemia frequently and would then go to ED.  GF states she will be responsible for checking blood sugars and giving his insulin. Family refusing SNF placement. States he will have 24/7 care. Discussed above with RN.  Inpatient Diabetes Program Recommendations:     Lantus 20 units QHS Novolog 0-15 units tidwc and hs  Novolog 4 units tidwc for meal coverage insulin.  Has f/u appt with [facility] next week.  Thank you. Lorenda Peck, RD, LDN, CDE Inpatient Diabetes Coordinator [phone]

## 2018-10-18 NOTE — Progress Notes (Signed)
Patient discharged in stable condition with all belongings. Girlfriend and mother at bedside, they all three verbalized understanding of discharge instructions and importance of follow up visits.

## 2018-10-18 NOTE — Care Management Note (Signed)
Case Management Note  Patient Details  Name: Jeffrey [name] MRN: [id number] Date of Birth: [birth year]  Subjective/Objective:     Pt admitted with acute encephalopathy. He is from home with family.                Action/Plan: Pt discharging to his girlfriend, Barbara's home. [phone] Pt with orders for [facility] services. CM provided choice and they selected [facility]. [facility] unable to provide the PT part of the [facility]. Spoke with family and [facility]. Dorian Pod with [facility] notified and accepted the referral. Girlfriend to provide 24 hour supervision. Family to provide transport home.   Expected Discharge Date:  10/18/18               Expected Discharge Plan:  [place]  In-House Referral:     Discharge planning Services  CM Consult  Post Acute Care Choice:  Home Health Choice offered to:  Patient(girlfriend)  DME Arranged:    DME Agency:     HH Arranged:  PT, RN [place] Agency:  [facility]  Status of Service:  Completed, signed off  If discussed at [place] of Stay Meetings, dates discussed:    Additional Comments:  Pollie Friar, RN 10/18/2018, 3:32 PM

## 2018-10-18 NOTE — Discharge Summary (Signed)
Jeffrey [name], is a 52 y.o. male  DOB Jul 23, 1966  MRN [id number].  Admission date:  10/16/2018  Admitting Physician  Ivor Costa, MD  Discharge Date:  10/18/2018   Primary MD  Charlott Rakes, MD  Recommendations for primary care physician for things to follow:   1)Very low-salt diet advised 2)Weigh yourself daily, call if you gain more than 3 pounds in 1 day or more than 5 pounds in 1 week as your diuretic medications may need to be adjusted 3)Limit your Fluid  intake to no more than 60 ounces (1.8 Liters) per day 4)You have-- Poorly controlled diabetes with erratic blood sugars -check your blood sugar before each meal 3 times a day and also before bedtime keep a diary of your blood sugars and take this diary/record with you when you see your primary care physician Dr. Jarold Song on 10/24/2018 5)Take Lantus insulin and NovoLog insulin per sliding scale as advised  Admission Diagnosis  Metabolic encephalopathy [W65.68] Hypoglycemia [E16.2]   Discharge Diagnosis  Metabolic encephalopathy [L27.51] Hypoglycemia [E16.2]    Principal Problem:   Acute metabolic encephalopathy Active Problems:   Essential hypertension   Uncontrolled type 2 diabetes mellitus with hyperglycemia, with long-term current use of insulin (HCC)   Chronic combined systolic (congestive) and diastolic (congestive) heart failure (HCC)   NICM (nonischemic cardiomyopathy) ([place])   Coronary artery disease involving native coronary artery of native heart without angina pectoris   Hyperlipidemia   S/P ICD (internal cardiac defibrillator) procedure   Hypoglycemia      Past Medical History:  Diagnosis Date  . CAD (coronary artery disease)    a. cath 01/31/17: 60% 1st RPLB, 60% dist RCA, 55% prox RCA, 10% pro LAD --> Rx TX.   Marland Kitchen Chronic systolic CHF (congestive heart failure) ([place]) 01/28/2017   1. Echo 01/29/17:  EF 20-25, normal wall motion,  mild LAE // 2. EF 10-15 by [facility] 01/2017   . Diabetes mellitus   . Diabetic foot infection ([place]) 03/2016   RT FOOT  . HTN (hypertension)   . Hyperlipidemia   . NICM (nonischemic cardiomyopathy) ([place]) 02/15/2017   1. Mod non-obs CAD on LHC in 01/2017 - CAD does not explain cardiomyopathy    Past Surgical History:  Procedure Laterality Date  . AMPUTATION Right 04/01/2016   Procedure: Right Great Toe Amputation;  Surgeon: Newt Minion, MD;  Location: [facility];  Service: Orthopedics;  Laterality: Right;  . AMPUTATION Right 06/19/2016   Procedure: AMPUTATION SECOND TOE;  Surgeon: Marybelle Killings, MD;  Location: [place];  Service: Orthopedics;  Laterality: Right;  . AMPUTATION TOE Right   . BACK SURGERY     for abscess  . ICD IMPLANT N/A 01/15/2018   Procedure: ICD IMPLANT;  Surgeon: Deboraha Sprang, MD;  Location: [facility];  Service: Cardiovascular;  Laterality: N/A;  . RIGHT/LEFT HEART CATH AND CORONARY ANGIOGRAPHY N/A 01/31/2017   Procedure: Right/Left Heart Cath and Coronary Angiography;  Surgeon: Troy Sine, MD;  Location: [facility];  Service: Cardiovascular;  Laterality: N/A;    HPI  from the history and physical done on the day of admission:    PCP: Charlott Rakes, MD   Patient coming from:  The patient is coming from home.  At baseline, pt is independent for most of ADL.        Chief Complaint: AMS  HPI: Jeffrey [name] is a 52 y.o. male with medical history significant of hypertension, hyperlipidemia, diabetes mellitus, CAD, CHF with EF 20%, nonischemic cardiomyopathy, AICD placement, who presents with altered mental status.  Per patient's sister, patient was last known normal at 8:30 AM. Pt was found to to be confused in the evening.  He was found to be on the couch next to a broken chair by family member. Not sure what happed to him. Per EMS, pt was found to have hypoglycemia with blood sugar 48, which improved to 145 after giving 25 g of D10, but mental status has  not improved.  Patient is still confused, not oriented x3.  No facial droop, slurred speech noted.  Patient moves all extremities.  Patient does not have active respiratory distress, cough, nausea, vomiting, diarrhea.  ED Course: pt was found to have WBC 6.5, negative UDS, negative urinalysis, electrolytes renal function okay, temperature normal, slightly tachycardia, RR 23, oxygen saturation 97% on room air, elevated blood pressure 189/166-->163/91.  CT head is negative for acute intracranial abnormalities.  CT of C-spine is negative for bony fracture.  Neurology was consulted, Dr. Cheral Marker recommended to get CT angiogram of head/neck and cerebral perfusion, which showed no LVO or stroke, but showed severe narrowing of the left vertebral artery V1 segment. Pt is placed on tele bed for obs.   Hospital Course:     Brief Summary 52 y.o.malewith medical history significant ofHypertension, hyperlipidemia, diabetes mellitus, CAD, CHF with EF 20%, nonischemic cardiomyopathy, AICD placement admitted 10/16/18 with altered mental status/metabolic encephalopathy secondary to persistent hypoglycemia  Plan:-  1)Acute metabolic Encephalopathy--- secondary to persistent hypoglycemia, patient apparently took more insulin than prescribed accidentally, mental status has returned to baseline according to patient's significant other who is at bedside.  Initially Lantus insulin was held during hospital stay.   2)DM2--- last A1c was 15.5 about 4 months ago, patient blood sugars are erratic, he is noncompliant takes as much insulin as he figures he should take rather than following the sliding scale... He had diabetic education, post discharge patient will need visiting home nurse to help improve medication compliance. Okay to discharge on Lantus insulin 40 units twice a day along with sliding scale NovoLog insulin   3)Generalized Weakness/Debility--- fall risk, most likely due to #1 above,  PT eval appreciated,   patient will get home health PT  4)HFrEF/Non-Ischemic cardiomyopathy status post AICD placement-----patient with history of combined diastolic and systolic dysfunction CHF, EF in the 20% range, on admission due to altered mentation diuretics were initially on hold, they were resumed once mental status improved and patient was taking oral intake , discharged home on Lasix and Aldactone,  continue Entresto, patient is currently euvolemic, no evidence of volume overload , continue aspirin and Crestor  Code Status : full  Disposition Plan  : Discharge on 10/18/2018 with home health therapy and RN Consults  :  Diabetic coordinator  Discharge Condition: stable  Follow UP  Follow-up Information    Charlott Rakes, MD Follow up.   Specialty:  Family Medicine Contact information: [address] [place] [address] [phone]  Diet and Activity recommendation:  As advised  Discharge Instructions    Discharge Instructions    Call MD for:  difficulty breathing, headache or visual disturbances   Complete by:  As directed    Call MD for:  persistant dizziness or light-headedness   Complete by:  As directed    Call MD for:  persistant nausea and vomiting   Complete by:  As directed    Call MD for:  severe uncontrolled pain   Complete by:  As directed    Call MD for:  temperature >100.4   Complete by:  As directed    Diet - low sodium heart healthy   Complete by:  As directed    Diet Carb Modified   Complete by:  As directed    Discharge instructions   Complete by:  As directed    1)Very low-salt diet advised 2)Weigh yourself daily, call if you gain more than 3 pounds in 1 day or more than 5 pounds in 1 week as your diuretic medications may need to be adjusted 3)Limit your Fluid  intake to no more than 60 ounces (1.8 Liters) per day 4)You have-- Poorly controlled diabetes with erratic blood sugars -check your blood sugar before each meal 3 times a day and also  before bedtime keep a diary of your blood sugars and take this diary/record with you when you see your primary care physician Dr. Jarold Song on 10/24/2018 5)Take Lantus insulin and NovoLog insulin per sliding scale as advised   Increase activity slowly   Complete by:  As directed       Discharge Medications     Allergies as of 10/18/2018   No Known Allergies     Medication List    TAKE these medications   acetaminophen 500 MG tablet Commonly known as:  TYLENOL Take 1,000 mg by mouth every 6 (six) hours as needed for headache (pain).   albuterol 108 (90 Base) MCG/ACT inhaler Commonly known as:  PROVENTIL HFA;VENTOLIN HFA Inhale 1-2 puffs into the lungs every 6 (six) hours as needed for wheezing or shortness of breath.   aspirin 81 MG EC tablet Take 1 tablet (81 mg total) by mouth daily.   benzonatate 100 MG capsule Commonly known as:  TESSALON TAKE 1 CAPSULE BY MOUTH 3 TIMES DAILY AS NEEDED FOR COUGH. What changed:  See the new instructions.   carvedilol 6.25 MG tablet Commonly known as:  COREG Take 2 tablets (12.5 mg total) by mouth 2 (two) times daily with a meal.   cyclobenzaprine 5 MG tablet Commonly known as:  FLEXERIL Take 1 tablet (5 mg total) at bedtime by mouth. What changed:    when to take this  reasons to take this   ferrous sulfate 325 (65 FE) MG tablet Take 1 tablet (325 mg total) by mouth 2 (two) times daily with a meal. What changed:  when to take this   furosemide 40 MG tablet Commonly known as:  LASIX Take 1 tablet (40 mg total) by mouth daily.   gabapentin 400 MG capsule Commonly known as:  NEURONTIN Take 1 capsule (400 mg total) by mouth 3 (three) times daily.   glucose blood test strip Use 3 times daily before meals   insulin aspart 100 UNIT/ML injection Commonly known as:  novoLOG Inject 0-15 Units into the skin 3 (three) times daily with meals. Sliding scale  CBG 70 - 120: 0 units: CBG 121 - 140: 2 units; CBG 140 - 200: 4 units; CBG  201 -  240: 6 units; CBG 240 - 300: 8 units;CBG 300 - 350: 12 units; CBG 351 - 400: 16 units; CBG > 400 : 16 units and notify your  MD   Insulin Glargine 100 UNIT/ML Solostar Pen Commonly known as:  LANTUS Inject 40 Units into the skin 2 (two) times daily. What changed:  how much to take   Insulin Pen Needle 32G X 4 MM Misc Use to inject Lantus daily. Must use new pen needle with each injection.   potassium chloride 10 MEQ tablet Commonly known as:  K-DUR Take 1 tablet (10 mEq total) by mouth daily.   rosuvastatin 40 MG tablet Commonly known as:  CRESTOR Take 1 tablet (40 mg total) by mouth daily. What changed:  when to take this   sacubitril-valsartan 49-51 MG Commonly known as:  ENTRESTO Take 1 tablet by mouth 2 (two) times daily.   spironolactone 25 MG tablet Commonly known as:  ALDACTONE Take 1 tablet (25 mg total) by mouth daily.   traZODone 100 MG tablet Commonly known as:  DESYREL Take 1 tablet (100 mg total) by mouth at bedtime. For sleep   TRUE METRIX METER Devi 1 each by Does not apply route 3 (three) times daily before meals.   TRUEPLUS LANCETS 28G Misc 1 each by Does not apply route 3 (three) times daily before meals.       Major procedures and Radiology Reports - PLEASE review detailed and final reports for all details, in brief -   Ct Angio Head W Or Wo Contrast  Result Date: 10/16/2018 CLINICAL DATA:  Altered mental status.  Found down. EXAM: CT ANGIOGRAPHY HEAD AND NECK CT PERFUSION BRAIN TECHNIQUE: Multidetector CT imaging of the head and neck was performed using the standard protocol during bolus administration of intravenous contrast. Multiplanar CT image reconstructions and MIPs were obtained to evaluate the vascular anatomy. Carotid stenosis measurements (when applicable) are obtained utilizing NASCET criteria, using the distal internal carotid diameter as the denominator. Multiphase CT imaging of the brain was performed following IV bolus contrast injection.  Subsequent parametric perfusion maps were calculated using RAPID software. CONTRAST:  50 mL Isovue 370 COMPARISON:  Head CT 10/16/2018 FINDINGS: CTA NECK FINDINGS SKELETON: There is no bony spinal canal stenosis. No lytic or blastic lesion. OTHER NECK: Normal pharynx, larynx and major salivary glands. No cervical lymphadenopathy. Unremarkable thyroid gland. UPPER CHEST: No pneumothorax or pleural effusion. No nodules or masses. AORTIC ARCH: There is no calcific atherosclerosis of the aortic arch. There is no aneurysm, dissection or hemodynamically significant stenosis of the visualized ascending aorta and aortic arch. Conventional 3 vessel aortic branching pattern. The visualized proximal subclavian arteries are widely patent. RIGHT CAROTID SYSTEM: --Common carotid artery: Widely patent origin without common carotid artery dissection or aneurysm. --Internal carotid artery: No dissection, occlusion or aneurysm. No hemodynamically significant stenosis. --External carotid artery: No acute abnormality. LEFT CAROTID SYSTEM: --Common carotid artery: Widely patent origin without common carotid artery dissection or aneurysm. --Internal carotid artery: No dissection, occlusion or aneurysm. No hemodynamically significant stenosis. --External carotid artery: No acute abnormality. VERTEBRAL ARTERIES: Codominant configuration. There is severe stenosis of the left V1 segment. Otherwise, both vertebral arteries are normal. No dissection, occlusion or flow-limiting stenosis to the vertebrobasilar confluence. CTA HEAD FINDINGS ANTERIOR CIRCULATION: --Intracranial internal carotid arteries: Normal. --Anterior cerebral arteries: Normal. Both A1 segments are present. Patent anterior communicating artery. --Middle cerebral arteries: Normal. --Posterior communicating arteries: Absent bilaterally. POSTERIOR CIRCULATION: --Basilar artery: Normal. --Posterior cerebral arteries: Normal. --  Superior cerebellar arteries: Normal. --Inferior  cerebellar arteries: Normal anterior and posterior inferior cerebellar arteries. VENOUS SINUSES: As permitted by contrast timing, patent. ANATOMIC VARIANTS: None DELAYED PHASE: Not performed. Review of the MIP images confirms the above findings. CT Brain Perfusion Findings: CBF (<30%) Volume: 61mL Perfusion (Tmax>6.0s) volume: 65mL Mismatch Volume: 67mL Infarction Location:n/a IMPRESSION: 1. No emergent large vessel occlusion. No infarct by CT perfusion criteria. 2. 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 Otherwise normal carotid and vertebral systems. Electronically Signed   By: Ulyses Jarred M.D.   On: 10/16/2018 23:26   Ct Head Wo Contrast  Result Date: 10/16/2018 CLINICAL DATA:  Altered mental status. Hypoglycemia. EXAM: CT HEAD WITHOUT CONTRAST CT CERVICAL SPINE WITHOUT CONTRAST TECHNIQUE: Multidetector CT imaging of the head and cervical spine was performed following the standard protocol without intravenous contrast. Multiplanar CT image reconstructions of the cervical spine were also generated. COMPARISON:  01/12/2018 FINDINGS: CT HEAD FINDINGS Brain: There is no evidence of acute infarct, intracranial hemorrhage, mass, midline shift, or extra-axial fluid collection. The ventricles and sulci are normal. Cerebral white matter hypodensities are nonspecific but compatible with mild chronic small vessel ischemic disease. 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 Vascular: No hyperdense vessel. Skull: No fracture or focal osseous lesion. Sinuses/Orbits: Paranasal sinuses and mastoid air cells are clear. Unremarkable orbits. Other: None. CT CERVICAL SPINE FINDINGS Alignment: Mild cervical spine straightening. No listhesis. Skull base and vertebrae: No acute fracture or suspicious osseous lesion. Soft tissues and spinal canal: No prevertebral fluid or swelling. No visible canal hematoma. Disc levels:  Mild cervical spondylosis. Upper chest: Clear lung apices. Other: None.  IMPRESSION: 1. No evidence of acute intracranial abnormality. 2. Mild chronic small vessel ischemic disease. 3. No evidence of acute fracture or subluxation in the cervical spine. Electronically Signed   By: Logan Bores M.D.   On: 10/16/2018 20:21   Ct Angio Neck W And/or Wo Contrast  Result Date: 10/16/2018 CLINICAL DATA:  Altered mental status.  Found down. EXAM: CT ANGIOGRAPHY HEAD AND NECK CT PERFUSION BRAIN TECHNIQUE: Multidetector CT imaging of the head and neck was performed using the standard protocol during bolus administration of intravenous contrast. Multiplanar CT image reconstructions and MIPs were obtained to evaluate the vascular anatomy. Carotid stenosis measurements (when applicable) are obtained utilizing NASCET criteria, using the distal internal carotid diameter as the denominator. Multiphase CT imaging of the brain was performed following IV bolus contrast injection. Subsequent parametric perfusion maps were calculated using RAPID software. CONTRAST:  50 mL Isovue 370 COMPARISON:  Head CT 10/16/2018 FINDINGS: CTA NECK FINDINGS SKELETON: There is no bony spinal canal stenosis. No lytic or blastic lesion. OTHER NECK: Normal pharynx, larynx and major salivary glands. No cervical lymphadenopathy. Unremarkable thyroid gland. UPPER CHEST: No pneumothorax or pleural effusion. No nodules or masses. AORTIC ARCH: There is no calcific atherosclerosis of the aortic arch. There is no aneurysm, dissection or hemodynamically significant stenosis of the visualized ascending aorta and aortic arch. Conventional 3 vessel aortic branching pattern. The visualized proximal subclavian arteries are widely patent. RIGHT CAROTID SYSTEM: --Common carotid artery: Widely patent origin without common carotid artery dissection or aneurysm. --Internal carotid artery: No dissection, occlusion or aneurysm. No hemodynamically significant stenosis. --External carotid artery: No acute abnormality. LEFT CAROTID SYSTEM: --Common  carotid artery: Widely patent origin without common carotid artery dissection or aneurysm. --Internal carotid artery: No dissection, occlusion or aneurysm. No hemodynamically significant stenosis. --External carotid artery: No acute abnormality. VERTEBRAL ARTERIES:  Codominant configuration. There is severe stenosis of the left V1 segment. Otherwise, both vertebral arteries are normal. No dissection, occlusion or flow-limiting stenosis to the vertebrobasilar confluence. CTA HEAD FINDINGS ANTERIOR CIRCULATION: --Intracranial internal carotid arteries: Normal. --Anterior cerebral arteries: Normal. Both A1 segments are present. Patent anterior communicating artery. --Middle cerebral arteries: Normal. --Posterior communicating arteries: Absent bilaterally. POSTERIOR CIRCULATION: --Basilar artery: Normal. --Posterior cerebral arteries: Normal. --Superior cerebellar arteries: Normal. --Inferior cerebellar arteries: Normal anterior and posterior inferior cerebellar arteries. VENOUS SINUSES: As permitted by contrast timing, patent. ANATOMIC VARIANTS: None DELAYED PHASE: Not performed. Review of the MIP images confirms the above findings. CT Brain Perfusion Findings: CBF (<30%) Volume: 68mL Perfusion (Tmax>6.0s) volume: 27mL Mismatch Volume: 27mL Infarction Location:n/a IMPRESSION: 1. No emergent large vessel occlusion. No infarct by CT perfusion criteria. 2. 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 Otherwise normal carotid and vertebral systems. Electronically Signed   By: Ulyses Jarred M.D.   On: 10/16/2018 23:26   Ct Cervical Spine Wo Contrast  Result Date: 10/16/2018 CLINICAL DATA:  Altered mental status. Hypoglycemia. EXAM: CT HEAD WITHOUT CONTRAST CT CERVICAL SPINE WITHOUT CONTRAST TECHNIQUE: Multidetector CT imaging of the head and cervical spine was performed following the standard protocol without intravenous contrast. Multiplanar CT image reconstructions of the cervical spine were also generated.  COMPARISON:  01/12/2018 FINDINGS: CT HEAD FINDINGS Brain: There is no evidence of acute infarct, intracranial hemorrhage, mass, midline shift, or extra-axial fluid collection. The ventricles and sulci are normal. Cerebral white matter hypodensities are nonspecific but compatible with mild chronic small vessel ischemic disease. 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 Vascular: No hyperdense vessel. Skull: No fracture or focal osseous lesion. Sinuses/Orbits: Paranasal sinuses and mastoid air cells are clear. Unremarkable orbits. Other: None. CT CERVICAL SPINE FINDINGS Alignment: Mild cervical spine straightening. No listhesis. Skull base and vertebrae: No acute fracture or suspicious osseous lesion. Soft tissues and spinal canal: No prevertebral fluid or swelling. No visible canal hematoma. Disc levels:  Mild cervical spondylosis. Upper chest: Clear lung apices. Other: None. IMPRESSION: 1. No evidence of acute intracranial abnormality. 2. Mild chronic small vessel ischemic disease. 3. No evidence of acute fracture or subluxation in the cervical spine. Electronically Signed   By: Logan Bores M.D.   On: 10/16/2018 20:21   Ct Cerebral Perfusion W Contrast  Result Date: 10/16/2018 CLINICAL DATA:  Altered mental status.  Found down. EXAM: CT ANGIOGRAPHY HEAD AND NECK CT PERFUSION BRAIN TECHNIQUE: Multidetector CT imaging of the head and neck was performed using the standard protocol during bolus administration of intravenous contrast. Multiplanar CT image reconstructions and MIPs were obtained to evaluate the vascular anatomy. Carotid stenosis measurements (when applicable) are obtained utilizing NASCET criteria, using the distal internal carotid diameter as the denominator. Multiphase CT imaging of the brain was performed following IV bolus contrast injection. Subsequent parametric perfusion maps were calculated using RAPID software. CONTRAST:  50 mL Isovue 370 COMPARISON:  Head CT  10/16/2018 FINDINGS: CTA NECK FINDINGS SKELETON: There is no bony spinal canal stenosis. No lytic or blastic lesion. OTHER NECK: Normal pharynx, larynx and major salivary glands. No cervical lymphadenopathy. Unremarkable thyroid gland. UPPER CHEST: No pneumothorax or pleural effusion. No nodules or masses. AORTIC ARCH: There is no calcific atherosclerosis of the aortic arch. There is no aneurysm, dissection or hemodynamically significant stenosis of the visualized ascending aorta and aortic arch. Conventional 3 vessel aortic branching pattern. The visualized proximal subclavian arteries are widely patent. RIGHT CAROTID SYSTEM: --Common  carotid artery: Widely patent origin without common carotid artery dissection or aneurysm. --Internal carotid artery: No dissection, occlusion or aneurysm. No hemodynamically significant stenosis. --External carotid artery: No acute abnormality. LEFT CAROTID SYSTEM: --Common carotid artery: Widely patent origin without common carotid artery dissection or aneurysm. --Internal carotid artery: No dissection, occlusion or aneurysm. No hemodynamically significant stenosis. --External carotid artery: No acute abnormality. VERTEBRAL ARTERIES: Codominant configuration. There is severe stenosis of the left V1 segment. Otherwise, both vertebral arteries are normal. No dissection, occlusion or flow-limiting stenosis to the vertebrobasilar confluence. CTA HEAD FINDINGS ANTERIOR CIRCULATION: --Intracranial internal carotid arteries: Normal. --Anterior cerebral arteries: Normal. Both A1 segments are present. Patent anterior communicating artery. --Middle cerebral arteries: Normal. --Posterior communicating arteries: Absent bilaterally. POSTERIOR CIRCULATION: --Basilar artery: Normal. --Posterior cerebral arteries: Normal. --Superior cerebellar arteries: Normal. --Inferior cerebellar arteries: Normal anterior and posterior inferior cerebellar arteries. VENOUS SINUSES: As permitted by contrast  timing, patent. ANATOMIC VARIANTS: None DELAYED PHASE: Not performed. Review of the MIP images confirms the above findings. CT Brain Perfusion Findings: CBF (<30%) Volume: 60mL Perfusion (Tmax>6.0s) volume: 14mL Mismatch Volume: 38mL Infarction Location:n/a IMPRESSION: 1. No emergent large vessel occlusion. No infarct by CT perfusion criteria. 2. 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 Otherwise normal carotid and vertebral systems. Electronically Signed   By: Ulyses Jarred M.D.   On: 10/16/2018 23:26   Dg Chest Port 1 View  Result Date: 10/16/2018 CLINICAL DATA:  Altered level of consciousness EXAM: PORTABLE CHEST 1 VIEW COMPARISON:  06/22/2018 FINDINGS: Left-sided pacing device incompletely visualized lead. Cardiomegaly with central vascular congestion. No pleural effusion. No pneumothorax. IMPRESSION: Cardiomegaly with mild central congestion Electronically Signed   By: Donavan Foil M.D.   On: 10/16/2018 19:53    Micro Results   Today   Subjective    Jeffrey [name] today has no new complaints, eating or drinking well, ambulating to bathroom with minimal assistance,           Patient has been seen and examined prior to discharge   Objective   Blood pressure (!) 148/85, pulse 87, temperature 98.9 F (37.2 C), temperature source Oral, resp. rate 17, height 5\' 7"  (1.702 m), weight 94.7 kg, SpO2 99 %.   Intake/Output Summary (Last 24 hours) at 10/18/2018 1627 Last data filed at 10/18/2018 1227 Gross per 24 hour  Intake 720 ml  Output 500 ml  Net 220 ml    Exam atient is examined daily including today on 10/18/18 , exams remain the same as of yesterday except that has changed   Gen:- Awake Alert,  In no apparent distress  HEENT:- [place].AT, No sclera icterus Neck-Supple Neck,No JVD,.  Lungs-  CTAB , fair air movement CV- S1, S2 normal Abd-  +ve B.Sounds, Abd Soft, No tenderness,    Extremity/Skin:- No  Edema, amputation of right foot toes Psych-affect is   appropriate, alert and oriented x3  Neuro-no new focal deficits, no tremors, gait is more steady   Data Review   CBC w Diff:  Lab Results  Component Value Date   WBC 5.7 10/17/2018   HGB 12.0 (L) 10/17/2018   HCT 39.9 10/17/2018   PLT 260 10/17/2018   LYMPHOPCT 31 01/16/2018   MONOPCT 7 01/16/2018   EOSPCT 1 01/16/2018   BASOPCT 0 01/16/2018    CMP:  Lab Results  Component Value Date   NA 140 10/17/2018   NA 131 (L) 06/22/2018   K 3.2 (L) 10/17/2018   CL 108 10/17/2018   CO2 24  10/17/2018   BUN 18 10/17/2018   BUN 18 06/22/2018   CREATININE 1.24 10/17/2018   CREATININE 0.91 02/15/2017   PROT 7.6 10/16/2018   PROT 7.3 06/22/2018   ALBUMIN 3.2 (L) 10/16/2018   ALBUMIN 3.8 06/22/2018   BILITOT 0.5 10/16/2018   BILITOT 0.2 06/22/2018   ALKPHOS 68 10/16/2018   AST 33 10/16/2018   ALT 18 10/16/2018  .   Total Discharge time is about 33 minutes  Roxan Hockey M.D on 10/18/2018 at 4:27 PM  Pager---[phone]  Go to [email or web address] - password [email or web address] for contact info  [facility] - Office  [phone]

## 2018-10-19 ENCOUNTER — Encounter: Payer: Self-pay | Admitting: Cardiology

## 2018-10-22 MED FILL — BENZONATATE 100 MG CAP: 100 | 10 days supply | Qty: 30 | Fill #0

## 2018-10-23 ENCOUNTER — Telehealth: Payer: Self-pay | Admitting: Family Medicine

## 2018-10-23 NOTE — Telephone Encounter (Signed)
[facility]rey [name] with [facility] called for verbal orders. Once a week for one week Twice a week for one week Please follow up.

## 2018-10-23 NOTE — Telephone Encounter (Signed)
[facility] called for nursing visits 2 times a week for 4 weeks and 1 time a week for 4 weeks and 2 PRN please call nurse at [phone]

## 2018-10-24 ENCOUNTER — Ambulatory Visit: Payer: Medicaid Other | Attending: Family Medicine | Admitting: Family Medicine

## 2018-10-24 ENCOUNTER — Encounter: Payer: Self-pay | Admitting: Family Medicine

## 2018-10-24 VITALS — BP 119/74 | HR 86 | Temp 98.3°F | Ht 67.0 in | Wt 215.6 lb

## 2018-10-24 DIAGNOSIS — Z89421 Acquired absence of other right toe(s): Secondary | ICD-10-CM | POA: Diagnosis not present

## 2018-10-24 DIAGNOSIS — E1169 Type 2 diabetes mellitus with other specified complication: Secondary | ICD-10-CM | POA: Diagnosis not present

## 2018-10-24 DIAGNOSIS — Z7982 Long term (current) use of aspirin: Secondary | ICD-10-CM | POA: Insufficient documentation

## 2018-10-24 DIAGNOSIS — E1165 Type 2 diabetes mellitus with hyperglycemia: Secondary | ICD-10-CM

## 2018-10-24 DIAGNOSIS — Z79899 Other long term (current) drug therapy: Secondary | ICD-10-CM | POA: Diagnosis not present

## 2018-10-24 DIAGNOSIS — I5022 Chronic systolic (congestive) heart failure: Secondary | ICD-10-CM | POA: Diagnosis not present

## 2018-10-24 DIAGNOSIS — E785 Hyperlipidemia, unspecified: Secondary | ICD-10-CM | POA: Insufficient documentation

## 2018-10-24 DIAGNOSIS — I739 Peripheral vascular disease, unspecified: Secondary | ICD-10-CM

## 2018-10-24 DIAGNOSIS — E1142 Type 2 diabetes mellitus with diabetic polyneuropathy: Secondary | ICD-10-CM | POA: Diagnosis not present

## 2018-10-24 DIAGNOSIS — I2510 Atherosclerotic heart disease of native coronary artery without angina pectoris: Secondary | ICD-10-CM | POA: Diagnosis not present

## 2018-10-24 DIAGNOSIS — Z794 Long term (current) use of insulin: Secondary | ICD-10-CM | POA: Diagnosis not present

## 2018-10-24 DIAGNOSIS — Z89411 Acquired absence of right great toe: Secondary | ICD-10-CM | POA: Diagnosis not present

## 2018-10-24 DIAGNOSIS — I10 Essential (primary) hypertension: Secondary | ICD-10-CM | POA: Diagnosis not present

## 2018-10-24 DIAGNOSIS — I779 Disorder of arteries and arterioles, unspecified: Secondary | ICD-10-CM

## 2018-10-24 DIAGNOSIS — Z1211 Encounter for screening for malignant neoplasm of colon: Secondary | ICD-10-CM

## 2018-10-24 DIAGNOSIS — I110 Hypertensive heart disease with heart failure: Secondary | ICD-10-CM | POA: Diagnosis present

## 2018-10-24 DIAGNOSIS — Z9889 Other specified postprocedural states: Secondary | ICD-10-CM | POA: Insufficient documentation

## 2018-10-24 LAB — POCT GLYCOSYLATED HEMOGLOBIN (HGB A1C): Hemoglobin A1C: 7.2 % — AB (ref 4.0–5.6)

## 2018-10-24 LAB — GLUCOSE, POCT (MANUAL RESULT ENTRY): POC GLUCOSE: 94 mg/dL (ref 70–99)

## 2018-10-24 MED ORDER — CARVEDILOL 12.5 MG PO TABS: 12.5000 mg | ORAL_TABLET | Freq: Two times a day (BID) | ORAL | 3 refills | Status: DC

## 2018-10-24 MED ORDER — ROSUVASTATIN CALCIUM 40 MG PO TABS: 40.0000 mg | ORAL_TABLET | Freq: Every day | ORAL | 3 refills | Status: DC

## 2018-10-24 MED ORDER — FUROSEMIDE 40 MG PO TABS: 60.0000 mg | ORAL_TABLET | Freq: Every day | ORAL | 3 refills | Status: DC

## 2018-10-24 MED ORDER — GABAPENTIN 400 MG PO CAPS: 400.0000 mg | ORAL_CAPSULE | Freq: Three times a day (TID) | ORAL | 3 refills | Status: DC

## 2018-10-24 MED ORDER — POTASSIUM CHLORIDE ER 10 MEQ PO TBCR: 10.0000 meq | EXTENDED_RELEASE_TABLET | Freq: Every day | ORAL | 3 refills | Status: DC

## 2018-10-24 MED ORDER — SPIRONOLACTONE 25 MG PO TABS: 25.0000 mg | ORAL_TABLET | Freq: Every day | ORAL | 3 refills | Status: DC

## 2018-10-24 MED FILL — POTASSIUM CL ER 10 MEQ TAB: 10 | 30 days supply | Qty: 30 | Fill #2

## 2018-10-24 MED FILL — ROSUVASTATIN CALCIUM 40 MG: 40 | 30 days supply | Qty: 30 | Fill #2

## 2018-10-24 MED FILL — GABAPENTIN 400 MG CAPSULE: 400 | 30 days supply | Qty: 90 | Fill #2

## 2018-10-24 MED FILL — SPIRONOLACTONE 25 MG TABLET: 25 | 30 days supply | Qty: 30 | Fill #2

## 2018-10-24 MED FILL — CARVEDILOL 6.25 MG TABLET: 6.25 | 30 days supply | Qty: 60 | Fill #1

## 2018-10-24 MED FILL — FUROSEMIDE 40 MG TAB: 40 | 30 days supply | Qty: 30 | Fill #3

## 2018-10-24 NOTE — Telephone Encounter (Signed)
Verbal orders were given 

## 2018-10-24 NOTE — Progress Notes (Signed)
Subjective:  Patient ID: Jeffrey [name], male    DOB: 1966/09/07  Age: 52 y.o. MRN: [id number]  CC: Diabetes   HPI Jeffrey [name] is a 52 year-old male with a past medical history of Essential hypertension, CHF with ICD placement, ischemic cardiomyopathy, diabetes type II, and diabetic neuropathy presents for follow up of chronic medical conditions. Patient recently hospitalized on 86/04/7845 for  Metabolic encephalopathy.  Acute Metabolic Encephalopathy - Patient recently hospitalized on 96/01/9527 for  Metabolic encphalopathy secondary to hypoglycemia event. While hospitalized Lantus was held and substituted with sliding scale.  CT head was negative for acute intracranial abnormality but revealed mild small vessel ischemic disease. CT angiogram of the head and neck revealed severe left vertebral artery narrowing. Once glucose stabilized patient was discharged and instructed to follow-up with PCP. [name] girlfriend is present today at the bedside and is concerned about changed in mental status since last hospitalized and says he seems slower to respond more then usual.  DM II - (HgbA1C 7.2 today; previously HgbA1c greater than 15.5). Girlfriend monitors fasting blood glucose; documented values range from 88-256.  Patient with hypoglycemic episode yesterday with a glucose of 31 after forgetting to eat Patient and patient girlfriend state that his glucose drops when he forgets to eat and administers insulin and at other times administers insulin above prescribed dose. He has been making progress to walk more and adhere to his diabetic diet. His girlfriend now stays with him to ensure that he eats properly to prevent further hypoglycemic events. He has not had examination due to insurance difficulties.    Essential Hypertension -Patient states he is complaint with medication regimen, but has not taken his hypertensive medication for the past 3 days. He denies adverse effects from medication  and arthalgias. Denies chest pain, visual changes, palpitations, slurred speech or paralysis, or facial dropping.   CHF/Non- Ischemic Cardiomyopathy - Last Echo 01/14/18 20%; previous Echo 20-25% 01/29/17. Patient admits that he is not complaint with fluid restriction. Patient and girlfriend believes that his weight has increased. Previous documented weight was 208 lbs 10/17/2018; weight today 215 lbs. He says that he does experience occasional experience shortness of breath. Denies wheezing, nocturnal dyspnea, and edema.  Patient and girlfriend are unsure if they need medication refills at this time. He requests the influenza vaccine; denies tetanus.  Past Medical History:  Diagnosis Date  . CAD (coronary artery disease)    a. cath 01/31/17: 60% 1st RPLB, 60% dist RCA, 55% prox RCA, 10% pro LAD --> Rx TX.   Marland Kitchen Chronic systolic CHF (congestive heart failure) ([place]) 01/28/2017   1. Echo 01/29/17:  EF 20-25, normal wall motion, mild LAE // 2. EF 10-15 by [facility] 01/2017   . Diabetes mellitus   . Diabetic foot infection ([place]) 03/2016   RT FOOT  . HTN (hypertension)   . Hyperlipidemia   . NICM (nonischemic cardiomyopathy) ([place]) 02/15/2017   1. Mod non-obs CAD on LHC in 01/2017 - CAD does not explain cardiomyopathy     Past Surgical History:  Procedure Laterality Date  . AMPUTATION Right 04/01/2016   Procedure: Right Great Toe Amputation;  Surgeon: Newt Minion, MD;  Location: [place];  Service: Orthopedics;  Laterality: Right;  . AMPUTATION Right 06/19/2016   Procedure: AMPUTATION SECOND TOE;  Surgeon: Marybelle Killings, MD;  Location: [place];  Service: Orthopedics;  Laterality: Right;  . AMPUTATION TOE Right   . BACK SURGERY     for abscess  . ICD  IMPLANT N/A 01/15/2018   Procedure: ICD IMPLANT;  Surgeon: Deboraha Sprang, MD;  Location: [place] [facility];  Service: Cardiovascular;  Laterality: N/A;  . RIGHT/LEFT HEART CATH AND CORONARY ANGIOGRAPHY N/A 01/31/2017   Procedure: Right/Left Heart Cath and Coronary  Angiography;  Surgeon: Troy Sine, MD;  Location: [place] [facility];  Service: Cardiovascular;  Laterality: N/A;      Outpatient Medications Prior to Visit  Medication Sig Dispense Refill  . acetaminophen (TYLENOL) 500 MG tablet Take 1,000 mg by mouth every 6 (six) hours as needed for headache (pain).    Marland Kitchen albuterol (PROVENTIL HFA;VENTOLIN HFA) 108 (90 Base) MCG/ACT inhaler Inhale 1-2 puffs into the lungs every 6 (six) hours as needed for wheezing or shortness of breath. 1 Inhaler 0  . aspirin EC 81 MG EC tablet Take 1 tablet (81 mg total) by mouth daily. 30 tablet 0  . benzonatate (TESSALON) 100 MG capsule TAKE 1 CAPSULE BY MOUTH 3 TIMES DAILY AS NEEDED FOR COUGH. (Patient taking differently: Take 100 mg by mouth 3 (three) times daily as needed for cough. ) 30 capsule 1  . Blood Glucose Monitoring Suppl (TRUE METRIX METER) DEVI 1 each by Does not apply route 3 (three) times daily before meals. 1 Device 0  . cyclobenzaprine (FLEXERIL) 5 MG tablet Take 1 tablet (5 mg total) at bedtime by mouth. (Patient taking differently: Take 5 mg by mouth at bedtime as needed for muscle spasms. ) 10 tablet 0  . ferrous sulfate 325 (65 FE) MG tablet Take 1 tablet (325 mg total) by mouth 2 (two) times daily with a meal. (Patient taking differently: Take 325 mg by mouth daily with breakfast. ) 60 tablet 3  . glucose blood (TRUE METRIX BLOOD GLUCOSE TEST) test strip Use 3 times daily before meals 100 each 12  . insulin aspart (NOVOLOG) 100 UNIT/ML injection Inject 0-15 Units into the skin 3 (three) times daily with meals. Sliding scale  CBG 70 - 120: 0 units: CBG 121 - 140: 2 units; CBG 140 - 200: 4 units; CBG 201 - 240: 6 units; CBG 240 - 300: 8 units;CBG 300 - 350: 12 units; CBG 351 - 400: 16 units; CBG > 400 : 16 units and notify your  MD 10 mL 3  . Insulin Glargine (LANTUS SOLOSTAR) 100 UNIT/ML Solostar Pen Inject 40 Units into the skin 2 (two) times daily. 5 pen 3  . Insulin Pen Needle (TRUEPLUS PEN NEEDLES)  32G X 4 MM MISC Use to inject Lantus daily. Must use new pen needle with each injection. 100 each 11  . sacubitril-valsartan (ENTRESTO) 49-51 MG Take 1 tablet by mouth 2 (two) times daily. 60 tablet 11  . traZODone (DESYREL) 100 MG tablet Take 1 tablet (100 mg total) by mouth at bedtime. For sleep 30 tablet 2  . TRUEPLUS LANCETS 28G MISC 1 each by Does not apply route 3 (three) times daily before meals. 100 each 12  . carvedilol (COREG) 6.25 MG tablet Take 2 tablets (12.5 mg total) by mouth 2 (two) times daily with a meal. 60 tablet 3  . gabapentin (NEURONTIN) 400 MG capsule Take 1 capsule (400 mg total) by mouth 3 (three) times daily. 90 capsule 3  . rosuvastatin (CRESTOR) 40 MG tablet Take 1 tablet (40 mg total) by mouth daily. (Patient taking differently: Take 40 mg by mouth every evening. ) 30 tablet 3  . spironolactone (ALDACTONE) 25 MG tablet Take 1 tablet (25 mg total) by mouth daily.  30 tablet 3  . furosemide (LASIX) 40 MG tablet Take 1 tablet (40 mg total) by mouth daily. 30 tablet 3  . potassium chloride (K-DUR) 10 MEQ tablet Take 1 tablet (10 mEq total) by mouth daily. 30 tablet 3   No facility-administered medications prior to visit.     ROS Review of Systems  Constitutional: Positive for unexpected weight change. Negative for chills and fever.  Eyes: Negative for visual disturbance.  Respiratory: Negative for cough, chest tightness and wheezing.        Occassional shortness of breath  Cardiovascular: Negative for chest pain, palpitations and leg swelling.  Gastrointestinal: Negative for abdominal pain.  Endocrine: Negative for polydipsia, polyphagia and polyuria.  Musculoskeletal: Negative for gait problem and myalgias.  Skin: Negative for color change, pallor, rash and wound.  Neurological: Negative for facial asymmetry, speech difficulty, numbness and headaches.  Psychiatric/Behavioral: Positive for decreased concentration.    Objective:  BP 119/74   Pulse 86   Temp  98.3 F (36.8 C) (Oral)   Ht 5\' 7"  (1.702 m)   Wt 215 lb 9.6 oz (97.8 kg)   SpO2 97%   BMI 33.77 kg/m   BP/Weight 10/24/2018 10/18/2018 00/06/6225  Systolic BP 333 545 -  Diastolic BP 74 94 -  Wt. (Lbs) 215.6 - 208.78  BMI 33.77 - 32.7   Lab Results  Component Value Date   HGBA1C 7.2 (A) 10/24/2018   Lab Results  Component Value Date   POCGLU 94 10/24/2018      Physical Exam  Constitutional: He is oriented to person, place, and time. He appears well-developed and well-nourished. No distress.  Neck: No JVD present.  Cardiovascular: Normal rate, regular rhythm, normal heart sounds and intact distal pulses. Exam reveals no gallop and no friction rub.  No murmur heard. Pulmonary/Chest: Effort normal and breath sounds normal. No respiratory distress. He has no wheezes. He has no rales.  Abdominal: Soft. Bowel sounds are normal. There is no tenderness.  Musculoskeletal: He exhibits no edema.  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  Neurological: He is alert and oriented to person, place, and time. He displays normal reflexes. He exhibits normal muscle tone.  Skin: Skin is warm and dry. Capillary refill takes less than 2 seconds. No rash noted.  Psychiatric: He has a normal mood and affect. Thought content normal.  Patient slow to respond.  Vitals reviewed.   CMP Latest Ref Rng & Units 10/17/2018 10/16/2018 10/16/2018  Glucose 70 - 99 mg/dL 158(H) 127(H) 132(H)  BUN 6 - 20 mg/dL 18 20 18   Creatinine 0.61 - 1.24 mg/dL 1.24 0.90 1.10  Sodium 135 - 145 mmol/L 140 141 138  Potassium 3.5 - 5.1 mmol/L 3.2(L) 3.7 3.6  Chloride 98 - 111 mmol/L 108 103 103  CO2 22 - 32 mmol/L 24 - 25  Calcium 8.9 - 10.3 mg/dL 9.2 - 9.5  Total Protein 6.5 - 8.1 g/dL - - 7.6  Total Bilirubin 0.3 - 1.2 mg/dL - - 0.5  Alkaline Phos 38 - 126 U/L - - 68  AST 15 - 41 U/L - - 33  ALT 0 - 44 U/L - - 18    Lipid Panel     Component Value Date/Time   CHOL 232 (H) 12/20/2017 0928   TRIG 107 12/20/2017  0928   HDL 53 12/20/2017 0928   CHOLHDL 4.4 12/20/2017 0928   CHOLHDL 4.9 02/01/2017 0259   VLDL 42 (H) 02/01/2017 0259   LDLCALC 158 (H) 12/20/2017 6256  Lab Results  Component Value Date   HGBA1C 7.2 (A) 10/24/2018     Assessment & Plan:   1. Type 2 diabetes mellitus with other specified complication, with long-term current use of insulin (HCC) Controlled with A1c of 7.2 Discussed hypoglycemic protocol and he knows to reduce insulin by 2 units if this occurs. Forgetting to eat and overdosing on insulin have been largely contributory but his caregiver/girlfriend is with him most of the time and promises to ensure he adheres to prescribed regimen Declines application for home health - POCT glucose (94) - POCT glycosylated hemoglobin ( 7.2 Hb A1C) - Continue current regimen. Advised to watch for signs of hypoglycemia. - - Continence of adhering to diabetic diet, walking, medication and monitoring discussed with patient and patient's girlfriend. Patient and girlfriend verbalized understanding. - rosuvastatin (CRESTOR) 40 MG tablet; Take 1 tablet (40 mg total) by mouth daily.  Dispense: 30 tablet; Refill: 3 - Ambulatory referral to Ophthalmology - Administer Influenza vaccination.  2. Chronic systolic CHF (congestive heart failure) (HCC) Slight volume overload despite weight gain Latest EF 20% 01/14/2018. - Patient with a 7 lb weight increase in a week. Increased Lasix dose from 40 mg to 60 mg once daily.  - Importance of adhering to fluid restriction and sodium restriction discussed with patient and patient's girlfriend. Discussed the importance of weighing himself daily at the same time and on the same scale.  - spironolactone (ALDACTONE) 25 MG tablet; Take 1 tablet (25 mg total) by mouth daily.  Dispense: 30 tablet; Refill: 3 - potassium chloride (K-DUR) 10 MEQ tablet; Take 1 tablet (10 mEq total) by mouth daily.  Dispense: 30 tablet; Refill: 3 - furosemide (LASIX) 40 MG tablet;  Take 1.5 tablets (60 mg total) by mouth daily.  Dispense: 45 tablet; Refill: 3  3. Diabetic polyneuropathy associated with type 2 diabetes mellitus (HCC)  Stable - Neuropathy not problematic at this time. Continue current regimen. - gabapentin (NEURONTIN) 400 MG capsule; Take 1 capsule (400 mg total) by mouth 3 (three) times daily.  Dispense: 90 capsule; Refill: 3  4. Essential hypertension Controlled BP 119/74 - DASH Diet & Sodium restriction discussed with patient and patient's girlfriend.  - Continue current regimen.  - carvedilol (COREG) 12.5 MG tablet; Take 1 tablet (12.5 mg total) by mouth 2 (two) times daily with a meal.  Dispense: 60 tablet; Refill: 3  5. Vertebral artery disease ([place]) Minicog normal in clinic - Girlfriend concerned with mental status of patient, currently alert and oriented to place time situation, with appropriate conversation. Given the patient's comorbidities and risk factors will obtain US Carotid Duplex and refer to vascular if necessary.  - US Carotid Duplex Bilateral; Future  6. Screening for colon cancer - Ambulatory referral to Gastroenterology   Meds ordered this encounter  Medications  . spironolactone (ALDACTONE) 25 MG tablet    Sig: Take 1 tablet (25 mg total) by mouth daily.    Dispense:  30 tablet    Refill:  3  . rosuvastatin (CRESTOR) 40 MG tablet    Sig: Take 1 tablet (40 mg total) by mouth daily.    Dispense:  30 tablet    Refill:  3  . potassium chloride (K-DUR) 10 MEQ tablet    Sig: Take 1 tablet (10 mEq total) by mouth daily.    Dispense:  30 tablet    Refill:  3  . gabapentin (NEURONTIN) 400 MG capsule    Sig: Take 1 capsule (400 mg total) by mouth 3 (  three) times daily.    Dispense:  90 capsule    Refill:  3  . furosemide (LASIX) 40 MG tablet    Sig: Take 1.5 tablets (60 mg total) by mouth daily.    Dispense:  45 tablet    Refill:  3  . carvedilol (COREG) 12.5 MG tablet    Sig: Take 1 tablet (12.5 mg total) by mouth 2  (two) times daily with a meal.    Dispense:  60 tablet    Refill:  3    Follow-up: Return in about 3 weeks (around 11/14/2018) for Follow-up of diabetes mellitus.   Charlott Rakes MD

## 2018-10-24 NOTE — Telephone Encounter (Signed)
[facility] was called and given verbal orders for nursing.

## 2018-10-30 ENCOUNTER — Ambulatory Visit (HOSPITAL_COMMUNITY)
Admission: RE | Admit: 2018-10-30 | Discharge: 2018-10-30 | Disposition: A | Discharge status: Home / Self Care | Payer: Medicaid Other | Source: Ambulatory Visit | Attending: Family Medicine | Admitting: Family Medicine

## 2018-10-30 ENCOUNTER — Other Ambulatory Visit: Payer: Self-pay | Admitting: Family Medicine

## 2018-10-30 DIAGNOSIS — I739 Peripheral vascular disease, unspecified: Secondary | ICD-10-CM | POA: Diagnosis present

## 2018-10-30 DIAGNOSIS — I779 Disorder of arteries and arterioles, unspecified: Secondary | ICD-10-CM

## 2018-10-30 NOTE — Progress Notes (Signed)
*  Preliminary Results* Carotid artery duplex has been completed. Bilateral internal carotid arteries are 1-39% stenosis. Vertebral arteries are patent with antegrade flow.  10/30/2018 9:30 AM  Jeffrey [name]

## 2018-10-31 ENCOUNTER — Telehealth: Payer: Self-pay

## 2018-10-31 NOTE — Telephone Encounter (Signed)
Patient was called and wife [name] was given the results of his doppler scan.

## 2018-10-31 NOTE — Telephone Encounter (Signed)
-----   Message from Charlott Rakes, MD sent at 10/30/2018  3:59 PM EST ----- Carotid Dopplers are normal however due to the narrowing of the vertebral arteries seen from one of his test during hospitalization I am referring him to a vascular surgeon for further evaluation.

## 2018-11-15 ENCOUNTER — Ambulatory Visit: Payer: Medicaid Other | Admitting: Family Medicine

## 2018-11-26 MED FILL — SPIRONOLACTONE 25 MG TABLET: 25 | 30 days supply | Qty: 30 | Fill #3

## 2018-11-27 ENCOUNTER — Other Ambulatory Visit: Payer: Self-pay | Admitting: Family Medicine

## 2018-11-27 ENCOUNTER — Other Ambulatory Visit: Payer: Self-pay | Admitting: Gastroenterology

## 2018-11-27 DIAGNOSIS — Z794 Long term (current) use of insulin: Principal | ICD-10-CM

## 2018-11-27 DIAGNOSIS — E1165 Type 2 diabetes mellitus with hyperglycemia: Secondary | ICD-10-CM

## 2018-11-27 MED ORDER — INSULIN GLARGINE 100 UNIT/ML SOLOSTAR PEN: 40.0000 [IU] | PEN_INJECTOR | Freq: Two times a day (BID) | SUBCUTANEOUS | 3 refills | Status: DC

## 2018-11-27 MED FILL — LANTUS SOLOSTAR 100 UNITS/M: 100 | 37 days supply | Qty: 30 | Fill #0

## 2018-11-28 ENCOUNTER — Other Ambulatory Visit: Payer: Self-pay | Admitting: Gastroenterology

## 2018-11-28 DIAGNOSIS — Z1211 Encounter for screening for malignant neoplasm of colon: Secondary | ICD-10-CM

## 2018-11-28 MED FILL — traZODone HCL 100 MG TABS: 100 | 30 days supply | Qty: 30 | Fill #1

## 2018-11-28 MED FILL — CARVEDILOL 6.25 MG TABLET: 6.25 | 30 days supply | Qty: 60 | Fill #2

## 2018-11-28 MED FILL — ROSUVASTATIN CALCIUM 40 MG: 40 | 30 days supply | Qty: 30 | Fill #3

## 2018-11-28 MED FILL — BENZONATATE 100 MG CAP: 100 | 10 days supply | Qty: 30 | Fill #1

## 2018-11-28 MED FILL — FUROSEMIDE 40 MG TAB: 40 | 30 days supply | Qty: 45 | Fill #0

## 2018-11-28 MED FILL — POTASSIUM CHLORIDE ER 10 ME: 10 | 30 days supply | Qty: 30 | Fill #0

## 2018-12-04 ENCOUNTER — Ambulatory Visit: Payer: Medicaid Other

## 2018-12-11 LAB — CUP PACEART REMOTE DEVICE CHECK
Battery Remaining Longevity: 128 mo
Battery Voltage: 3.04 V
Brady Statistic RV Percent Paced: 0.04 %
Date Time Interrogation Session: 20191031073623
HIGH POWER IMPEDANCE MEASURED VALUE: 54 Ohm
Implantable Pulse Generator Implant Date: 20190204
Lead Channel Impedance Value: 285 Ohm
Lead Channel Impedance Value: 380 Ohm
Lead Channel Pacing Threshold Amplitude: 0.75 V
Lead Channel Sensing Intrinsic Amplitude: 10.625 mV
Lead Channel Setting Pacing Amplitude: 2.5 V
Lead Channel Setting Pacing Pulse Width: 0.4 ms
Lead Channel Setting Sensing Sensitivity: 0.3 mV
MDC IDC LEAD IMPLANT DT: 20190204
MDC IDC LEAD LOCATION: 753860
MDC IDC MSMT LEADCHNL RV PACING THRESHOLD PULSEWIDTH: 0.4 ms
MDC IDC MSMT LEADCHNL RV SENSING INTR AMPL: 10.625 mV

## 2019-01-08 ENCOUNTER — Other Ambulatory Visit: Payer: Self-pay | Admitting: Family Medicine

## 2019-01-08 DIAGNOSIS — R05 Cough: Secondary | ICD-10-CM

## 2019-01-08 DIAGNOSIS — R059 Cough, unspecified: Secondary | ICD-10-CM

## 2019-01-08 MED FILL — GABAPENTIN 400 MG CAPSULE: 400 | 30 days supply | Qty: 90 | Fill #3

## 2019-01-08 MED FILL — ROSUVASTATIN CALCIUM 40 MG: 40 | 30 days supply | Qty: 30 | Fill #0

## 2019-01-08 MED FILL — CARVEDILOL 6.25 MG TABLET: 6.25 | 30 days supply | Qty: 60 | Fill #3

## 2019-01-08 MED FILL — SPIRONOLACTONE 25 MG TABLET: 25 | 30 days supply | Qty: 30 | Fill #0

## 2019-01-08 MED FILL — POTASSIUM CHLORIDE ER 10 ME: 10 | 30 days supply | Qty: 30 | Fill #1

## 2019-01-08 MED FILL — traZODone HCL 100 MG TABS: 100 | 30 days supply | Qty: 30 | Fill #2

## 2019-01-08 MED FILL — FUROSEMIDE 40 MG TAB: 40 | 30 days supply | Qty: 45 | Fill #1

## 2019-01-10 ENCOUNTER — Ambulatory Visit (INDEPENDENT_AMBULATORY_CARE_PROVIDER_SITE_OTHER): Payer: Medicaid Other

## 2019-01-10 DIAGNOSIS — I428 Other cardiomyopathies: Secondary | ICD-10-CM

## 2019-01-11 LAB — CUP PACEART REMOTE DEVICE CHECK
Battery Remaining Longevity: 126 mo
Battery Voltage: 3.03 V
Brady Statistic RV Percent Paced: 0.01 %
Date Time Interrogation Session: 20200130093824
HIGH POWER IMPEDANCE MEASURED VALUE: 57 Ohm
Implantable Lead Location: 753860
Implantable Pulse Generator Implant Date: 20190204
Lead Channel Impedance Value: 323 Ohm
Lead Channel Impedance Value: 380 Ohm
Lead Channel Pacing Threshold Amplitude: 0.875 V
Lead Channel Pacing Threshold Pulse Width: 0.4 ms
Lead Channel Sensing Intrinsic Amplitude: 12 mV
Lead Channel Sensing Intrinsic Amplitude: 12 mV
Lead Channel Setting Pacing Amplitude: 2.5 V
Lead Channel Setting Pacing Pulse Width: 0.4 ms
Lead Channel Setting Sensing Sensitivity: 0.3 mV
MDC IDC LEAD IMPLANT DT: 20190204

## 2019-01-11 MED FILL — LANTUS SOLOSTAR 100 UNITS/M: 100 | 28 days supply | Qty: 30 | Fill #1

## 2019-01-18 ENCOUNTER — Ambulatory Visit (INDEPENDENT_AMBULATORY_CARE_PROVIDER_SITE_OTHER): Payer: Medicaid Other

## 2019-01-18 DIAGNOSIS — I428 Other cardiomyopathies: Secondary | ICD-10-CM | POA: Diagnosis not present

## 2019-01-18 DIAGNOSIS — I472 Ventricular tachycardia, unspecified: Secondary | ICD-10-CM

## 2019-01-18 NOTE — Progress Notes (Signed)
Remote ICD transmission.   

## 2019-01-20 LAB — CUP PACEART REMOTE DEVICE CHECK
Battery Remaining Longevity: 125 mo
Battery Voltage: 2.99 V
Brady Statistic RV Percent Paced: 0.01 %
Date Time Interrogation Session: 20200207124943
HighPow Impedance: 53 Ohm
Implantable Lead Implant Date: 20190204
Implantable Lead Location: 753860
Implantable Pulse Generator Implant Date: 20190204
Lead Channel Impedance Value: 323 Ohm
Lead Channel Impedance Value: 380 Ohm
Lead Channel Pacing Threshold Amplitude: 0.75 V
Lead Channel Pacing Threshold Pulse Width: 0.4 ms
Lead Channel Sensing Intrinsic Amplitude: 11.625 mV
Lead Channel Sensing Intrinsic Amplitude: 11.625 mV
Lead Channel Setting Pacing Amplitude: 2.5 V
MDC IDC SET LEADCHNL RV PACING PULSEWIDTH: 0.4 ms
MDC IDC SET LEADCHNL RV SENSING SENSITIVITY: 0.3 mV

## 2019-01-21 ENCOUNTER — Encounter: Payer: Self-pay | Admitting: Cardiology

## 2019-01-29 NOTE — Progress Notes (Signed)
Remote ICD transmission.   

## 2019-02-19 ENCOUNTER — Other Ambulatory Visit: Payer: Self-pay | Admitting: Family Medicine

## 2019-02-19 DIAGNOSIS — R059 Cough, unspecified: Secondary | ICD-10-CM

## 2019-02-19 DIAGNOSIS — R05 Cough: Secondary | ICD-10-CM

## 2019-02-19 DIAGNOSIS — E1142 Type 2 diabetes mellitus with diabetic polyneuropathy: Secondary | ICD-10-CM

## 2019-02-19 DIAGNOSIS — I10 Essential (primary) hypertension: Secondary | ICD-10-CM

## 2019-02-19 MED FILL — ROSUVASTATIN CALCIUM 40 MG: 40 | 30 days supply | Qty: 30 | Fill #1

## 2019-02-19 MED FILL — FUROSEMIDE 40 MG TAB: 40 | 30 days supply | Qty: 45 | Fill #2

## 2019-02-19 MED FILL — POTASSIUM CHLORIDE ER 10 ME: 10 | 30 days supply | Qty: 30 | Fill #2

## 2019-02-19 MED FILL — GABAPENTIN 400 MG CAPSULE: 400 | 30 days supply | Qty: 90 | Fill #0

## 2019-02-19 MED FILL — SPIRONOLACTONE 25 MG TABLET: 25 | 30 days supply | Qty: 30 | Fill #1

## 2019-04-02 ENCOUNTER — Other Ambulatory Visit: Payer: Self-pay | Admitting: Family Medicine

## 2019-04-02 DIAGNOSIS — I10 Essential (primary) hypertension: Secondary | ICD-10-CM

## 2019-04-06 MED FILL — POTASSIUM CHLORIDE ER 10 ME: 10 | 30 days supply | Qty: 30 | Fill #3

## 2019-04-06 MED FILL — ROSUVASTATIN CALCIUM 40 MG: 40 | 30 days supply | Qty: 30 | Fill #2

## 2019-04-06 MED FILL — GABAPENTIN 400 MG CAPSULE: 400 | 30 days supply | Qty: 90 | Fill #0

## 2019-04-08 ENCOUNTER — Telehealth: Payer: Self-pay | Admitting: Family Medicine

## 2019-04-08 ENCOUNTER — Other Ambulatory Visit: Payer: Self-pay | Admitting: Family Medicine

## 2019-04-08 DIAGNOSIS — Z794 Long term (current) use of insulin: Principal | ICD-10-CM

## 2019-04-08 DIAGNOSIS — E1165 Type 2 diabetes mellitus with hyperglycemia: Secondary | ICD-10-CM

## 2019-04-08 NOTE — Telephone Encounter (Signed)
Patient dropped off paperwork will be put in PCP box.

## 2019-04-08 NOTE — Telephone Encounter (Signed)
Patient dropped of handicap form to be filled out by PCP. Patient will be called once paperwork is ready for pick up.

## 2019-04-09 MED FILL — LANTUS SOLOSTAR 100 UNITS/M: 100 | 19 days supply | Qty: 15 | Fill #0

## 2019-04-10 MED FILL — ENTRESTO 49 MG-51 MG TABLET: 49-51 | 30 days supply | Qty: 60 | Fill #3

## 2019-04-10 MED FILL — CARVEDILOL 12.5 MG TABLET: 12.5 | 30 days supply | Qty: 60 | Fill #0

## 2019-04-10 MED FILL — SPIRONOLACTONE 25 MG TABLET: 25 | 30 days supply | Qty: 30 | Fill #2

## 2019-04-11 ENCOUNTER — Other Ambulatory Visit: Payer: Self-pay

## 2019-04-11 ENCOUNTER — Ambulatory Visit (INDEPENDENT_AMBULATORY_CARE_PROVIDER_SITE_OTHER): Payer: Medicaid Other | Admitting: *Deleted

## 2019-04-11 DIAGNOSIS — I428 Other cardiomyopathies: Secondary | ICD-10-CM

## 2019-04-11 LAB — CUP PACEART REMOTE DEVICE CHECK
Battery Remaining Longevity: 124 mo
Battery Voltage: 3.03 V
Brady Statistic RV Percent Paced: 0.01 %
Date Time Interrogation Session: 20200430092723
HighPow Impedance: 61 Ohm
Implantable Lead Implant Date: 20190204
Implantable Lead Location: 753860
Implantable Pulse Generator Implant Date: 20190204
Lead Channel Impedance Value: 323 Ohm
Lead Channel Impedance Value: 380 Ohm
Lead Channel Pacing Threshold Amplitude: 0.875 V
Lead Channel Pacing Threshold Pulse Width: 0.4 ms
Lead Channel Sensing Intrinsic Amplitude: 13 mV
Lead Channel Setting Pacing Amplitude: 2.5 V
Lead Channel Setting Pacing Pulse Width: 0.4 ms
Lead Channel Setting Sensing Sensitivity: 0.3 mV

## 2019-04-18 MED FILL — BENZONATATE 100 MG CAPS: 100 | 10 days supply | Qty: 30 | Fill #0

## 2019-04-19 ENCOUNTER — Encounter: Payer: Self-pay | Admitting: Cardiology

## 2019-04-19 NOTE — Progress Notes (Signed)
Remote ICD transmission.   

## 2019-04-24 ENCOUNTER — Encounter: Payer: Self-pay | Admitting: Family Medicine

## 2019-04-24 ENCOUNTER — Telehealth: Payer: Self-pay | Admitting: Family Medicine

## 2019-04-24 ENCOUNTER — Ambulatory Visit: Payer: Medicaid Other | Attending: Family Medicine | Admitting: Family Medicine

## 2019-04-24 ENCOUNTER — Other Ambulatory Visit: Payer: Self-pay

## 2019-04-24 DIAGNOSIS — I110 Hypertensive heart disease with heart failure: Secondary | ICD-10-CM

## 2019-04-24 DIAGNOSIS — E1142 Type 2 diabetes mellitus with diabetic polyneuropathy: Secondary | ICD-10-CM | POA: Diagnosis not present

## 2019-04-24 DIAGNOSIS — I5042 Chronic combined systolic (congestive) and diastolic (congestive) heart failure: Secondary | ICD-10-CM

## 2019-04-24 DIAGNOSIS — Z9119 Patient's noncompliance with other medical treatment and regimen: Secondary | ICD-10-CM

## 2019-04-24 DIAGNOSIS — Z91199 Patient's noncompliance with other medical treatment and regimen due to unspecified reason: Secondary | ICD-10-CM

## 2019-04-24 DIAGNOSIS — E1165 Type 2 diabetes mellitus with hyperglycemia: Secondary | ICD-10-CM

## 2019-04-24 DIAGNOSIS — M62838 Other muscle spasm: Secondary | ICD-10-CM

## 2019-04-24 DIAGNOSIS — I10 Essential (primary) hypertension: Secondary | ICD-10-CM

## 2019-04-24 DIAGNOSIS — Z794 Long term (current) use of insulin: Secondary | ICD-10-CM

## 2019-04-24 MED ORDER — CYCLOBENZAPRINE HCL 5 MG PO TABS: 5.0000 mg | ORAL_TABLET | Freq: Every evening | ORAL | 0 refills | Status: DC | PRN

## 2019-04-24 MED FILL — CYCLOBENZAPRINE 5 MG TABLET: 5 | 30 days supply | Qty: 30 | Fill #0

## 2019-04-24 NOTE — Progress Notes (Signed)
Virtual Visit via Telephone Note  I connected with Greggory Keen, on 04/24/2019 at 9:44 AM by telephone due to the COVID-19 pandemic and verified that I am speaking with the correct person using two identifiers.   Consent: I discussed the limitations, risks, security and privacy concerns of performing an evaluation and management service by telephone and the availability of in person appointments. I also discussed with the patient that there may be a patient responsible charge related to this service. The patient expressed understanding and agreed to proceed.   Location of Patient: Home  Location of Provider: Clinic   Persons participating in Telemedicine visit: AARIZ MAISH  Barbara-caregiver Elmo Putt [place]-CMA Dr. Felecia Shelling     History of Present Illness: Jeffrey [name] is a 53 year old male with a history of type 2 diabetes mellitus (A1c 7.2), diabetic neuropathy, status post right great and second toe amputation , hypertension, Nonischemic cardiomyopathy, CHF (EF 20 % from 2-D echo 01/2018 status post ICD placement in 01/2018) who is seen today for follow-up visit.  He has not been checking his blood sugar but endorses compliance with his insulin.  His last A1c was 7.2 in 10/2018.  Compliance with a diabetic diet cannot been emphasized.  His caregiver is unsure if he has a phobia for needles and still has not been pricking his finger to check his sugars.  He has had a cough for the last 1 month but denies wheezing.  He has noticed right pedal edema and has gained about 20 pounds.  Endorses compliance with his Lasix but is not compliant with fluid restriction.  He has not been to see cardiology in a while. His caregiver has also noticed he sometimes loses control of his bowel and this is unrelated to coughing. He denies chest pain, dyspnea. Requests a refill of Flexeril which he uses intermittently for leg muscle cramps.  Past Medical History:  Diagnosis Date  .  CAD (coronary artery disease)    a. cath 01/31/17: 60% 1st RPLB, 60% dist RCA, 55% prox RCA, 10% pro LAD --> Rx TX.   Marland Kitchen Chronic systolic CHF (congestive heart failure) ([place]) 01/28/2017   1. Echo 01/29/17:  EF 20-25, normal wall motion, mild LAE // 2. EF 10-15 by [facility] 01/2017   . Diabetes mellitus   . Diabetic foot infection ([place]) 03/2016   RT FOOT  . HTN (hypertension)   . Hyperlipidemia   . NICM (nonischemic cardiomyopathy) ([place]) 02/15/2017   1. Mod non-obs CAD on LHC in 01/2017 - CAD does not explain cardiomyopathy   No Known Allergies  Current Outpatient Medications on File Prior to Visit  Medication Sig Dispense Refill  . acetaminophen (TYLENOL) 500 MG tablet Take 1,000 mg by mouth every 6 (six) hours as needed for headache (pain).    Marland Kitchen albuterol (PROVENTIL HFA;VENTOLIN HFA) 108 (90 Base) MCG/ACT inhaler Inhale 1-2 puffs into the lungs every 6 (six) hours as needed for wheezing or shortness of breath. 1 Inhaler 0  . aspirin EC 81 MG EC tablet Take 1 tablet (81 mg total) by mouth daily. 30 tablet 0  . Blood Glucose Monitoring Suppl (TRUE METRIX METER) DEVI 1 each by Does not apply route 3 (three) times daily before meals. 1 Device 0  . carvedilol (COREG) 12.5 MG tablet Take 1 tablet (12.5 mg total) by mouth 2 (two) times daily with a meal. 60 tablet 3  . cyclobenzaprine (FLEXERIL) 5 MG tablet Take 1 tablet (5 mg total) at bedtime by mouth. (Patient taking  differently: Take 5 mg by mouth at bedtime as needed for muscle spasms. ) 10 tablet 0  . ferrous sulfate 325 (65 FE) MG tablet Take 1 tablet (325 mg total) by mouth 2 (two) times daily with a meal. (Patient taking differently: Take 325 mg by mouth daily with breakfast. ) 60 tablet 3  . gabapentin (NEURONTIN) 400 MG capsule TAKE 1 CAPSULE BY MOUTH 3 TAKE TIMES DAILY. 90 capsule 3  . glucose blood (TRUE METRIX BLOOD GLUCOSE TEST) test strip Use 3 times daily before meals 100 each 12  . insulin aspart (NOVOLOG) 100 UNIT/ML injection Inject 0-15  Units into the skin 3 (three) times daily with meals. Sliding scale  CBG 70 - 120: 0 units: CBG 121 - 140: 2 units; CBG 140 - 200: 4 units; CBG 201 - 240: 6 units; CBG 240 - 300: 8 units;CBG 300 - 350: 12 units; CBG 351 - 400: 16 units; CBG > 400 : 16 units and notify your  MD 10 mL 3  . Insulin Pen Needle (TRUEPLUS PEN NEEDLES) 32G X 4 MM MISC Use to inject Lantus daily. Must use new pen needle with each injection. 100 each 11  . LANTUS SOLOSTAR 100 UNIT/ML Solostar Pen INJECT 40 UNITS INTO THE SKIN 2 (TWO) TIMES DAILY. 15 mL 1  . rosuvastatin (CRESTOR) 40 MG tablet Take 1 tablet (40 mg total) by mouth daily. 30 tablet 3  . sacubitril-valsartan (ENTRESTO) 49-51 MG Take 1 tablet by mouth 2 (two) times daily. 60 tablet 11  . spironolactone (ALDACTONE) 25 MG tablet Take 1 tablet (25 mg total) by mouth daily. 30 tablet 3  . traZODone (DESYREL) 100 MG tablet Take 1 tablet (100 mg total) by mouth at bedtime. For sleep 30 tablet 2  . TRUEPLUS LANCETS 28G MISC 1 each by Does not apply route 3 (three) times daily before meals. 100 each 12  . benzonatate (TESSALON) 100 MG capsule Take 1 capsule (100 mg total) by mouth 3 (three) times daily as needed for cough. (Patient not taking: Reported on 04/24/2019) 30 capsule 0  . furosemide (LASIX) 40 MG tablet Take 1.5 tablets (60 mg total) by mouth daily. 45 tablet 3  . potassium chloride (K-DUR) 10 MEQ tablet Take 1 tablet (10 mEq total) by mouth daily. 30 tablet 3   No current facility-administered medications on file prior to visit.     Observations/Objective: Awake, alert, oriented x3 Not in acute distress  CMP Latest Ref Rng & Units 10/17/2018 10/16/2018 10/16/2018  Glucose 70 - 99 mg/dL 158(H) 127(H) 132(H)  BUN 6 - 20 mg/dL _0 Creatinine 0.61 - 1.24 mg/dL 1.24 0.90 1.10  Sodium 135 - 145 mmol/L 140 141 138  Potassium 3.5 - 5.1 mmol/L 3.2(L) 3.7 3.6  Chloride 98 - 111 mmol/L 108 103 103  CO2 22 - 32 mmol/L 24 - 25  Calcium 8.9 - 10.3 mg/dL 9.2 -  9.5  Total Protein 6.5 - 8.1 g/dL - - 7.6  Total Bilirubin 0.3 - 1.2 mg/dL - - 0.5  Alkaline Phos 38 - 126 U/L - - 68  AST 15 - 41 U/L - - 33  ALT 0 - 44 U/L - - 18    Lipid Panel     Component Value Date/Time   CHOL 232 (H) 12/20/2017 0928   TRIG 107 12/20/2017 0928   HDL 53 12/20/2017 0928   CHOLHDL 4.4 12/20/2017 0928   CHOLHDL 4.9 02/01/2017 0259   VLDL 42 (H) 02/01/2017 0259  LDLCALC 158 (H) 12/20/2017 8453    Lab Results  Component Value Date   HGBA1C 7.2 (A) 10/24/2018     Assessment and Plan: 1. Chronic combined systolic (congestive) and diastolic (congestive) heart failure (HCC) EF 20% from 01/2018; from his history sounds to be fluid overloaded We will check BNP and likely increase Lasix dose after result is obtained Strongly encouraged to adhere to fluid restriction and low-sodium diet - Brain natriuretic peptide; Future  2. Essential hypertension Stable Continue antihypertensives We will check blood pressure at next visit  3. Diabetic polyneuropathy associated with type 2 diabetes mellitus (HCC) Stable Continue gabapentin  4. Uncontrolled type 2 diabetes mellitus with hyperglycemia, with long-term current use of insulin (HCC) A1c of 7.2 Unable to assess control given he has not been checking his sugars We will try to see if his insurance covers the freestyle libre and send accordingly to his pharmacy - Hemoglobin A1c; Future - CMP14+EGFR; Future - Lipid panel; Future - Microalbumin/Creatinine Ratio, Urine; Future  5. Muscle spasm He uses this intermittently for leg muscle spasms - cyclobenzaprine (FLEXERIL) 5 MG tablet; Take 1 tablet (5 mg total) by mouth at bedtime as needed for muscle spasms.  Dispense: 30 tablet; Refill: 0  6. Non-compliance Strongly encouraged compliance as he has a history of noncompliance Discussed complications associated with poor control of his chronic medical conditions but he seems to be nonchalant as he has been at his  previous visit.   Follow Up Instructions: Return in about 3 weeks (around 05/15/2019).    I discussed the assessment and treatment plan with the patient. The patient was provided an opportunity to ask questions and all were answered. The patient agreed with the plan and demonstrated an understanding of the instructions.   The patient was advised to call back or seek an in-person evaluation if the symptoms worsen or if the condition fails to improve as anticipated.     I provided 26 minutes total of non-face-to-face time during this encounter including median intraservice time, reviewing previous notes, labs, imaging, medications and explaining diagnosis and management.     Charlott Rakes, MD, FAAFP. [facility] and [place], [place]   04/24/2019, 9:44 AM

## 2019-04-24 NOTE — Progress Notes (Signed)
Patient has been called and DOB has been verified. Patient has been screened and transferred to PCP to start telephone visit.  Patient has had cough for 1 month.

## 2019-04-24 NOTE — Telephone Encounter (Signed)
Can you please check to see if this patient's insurance will cover the freestyle libre and send to the pharmacy accordingly?  Thank you

## 2019-04-25 ENCOUNTER — Ambulatory Visit: Payer: Medicaid Other | Attending: Family Medicine

## 2019-04-25 DIAGNOSIS — E1165 Type 2 diabetes mellitus with hyperglycemia: Secondary | ICD-10-CM

## 2019-04-25 DIAGNOSIS — Z794 Long term (current) use of insulin: Secondary | ICD-10-CM

## 2019-04-25 DIAGNOSIS — I5042 Chronic combined systolic (congestive) and diastolic (congestive) heart failure: Secondary | ICD-10-CM

## 2019-04-25 NOTE — Telephone Encounter (Signed)
I know that medicaid will pay for CGM devices but the process to obtain one is not something I am familiar with. So far the patients who have it that I know of have received it from an endocrine specialist.

## 2019-04-26 LAB — MICROALBUMIN / CREATININE URINE RATIO
Creatinine, Urine: 182.9 mg/dL
Microalb/Creat Ratio: 995 mg/g creat — ABNORMAL HIGH (ref 0–29)
Microalbumin, Urine: 1819.7 ug/mL

## 2019-04-26 LAB — CMP14+EGFR
ALT: 17 IU/L (ref 0–44)
AST: 17 IU/L (ref 0–40)
Albumin/Globulin Ratio: 1.1 — ABNORMAL LOW (ref 1.2–2.2)
Albumin: 4 g/dL (ref 3.8–4.9)
Alkaline Phosphatase: 83 IU/L (ref 39–117)
BUN/Creatinine Ratio: 18 (ref 9–20)
BUN: 25 mg/dL — ABNORMAL HIGH (ref 6–24)
Bilirubin Total: <0.2 mg/dL (ref 0.0–1.2)
CO2: 21 mmol/L (ref 20–29)
Calcium: 9.7 mg/dL (ref 8.7–10.2)
Chloride: 105 mmol/L (ref 96–106)
Creatinine, Ser: 1.39 mg/dL — ABNORMAL HIGH (ref 0.76–1.27)
GFR calc Af Amer: 66 mL/min/{1.73_m2} (ref >59)
GFR calc non Af Amer: 57 mL/min/{1.73_m2} — ABNORMAL LOW (ref >59)
Globulin, Total: 3.5 g/dL (ref 1.5–4.5)
Glucose: 89 mg/dL (ref 65–99)
Potassium: 3.8 mmol/L (ref 3.5–5.2)
Sodium: 139 mmol/L (ref 134–144)
Total Protein: 7.5 g/dL (ref 6.0–8.5)

## 2019-04-26 LAB — LIPID PANEL
Chol/HDL Ratio: 3.8 ratio (ref 0.0–5.0)
Cholesterol, Total: 137 mg/dL (ref 100–199)
HDL: 36 mg/dL — ABNORMAL LOW (ref >39)
LDL Calculated: 57 mg/dL (ref 0–99)
Triglycerides: 218 mg/dL — ABNORMAL HIGH (ref 0–149)
VLDL Cholesterol Cal: 44 mg/dL — ABNORMAL HIGH (ref 5–40)

## 2019-04-26 LAB — BRAIN NATRIURETIC PEPTIDE: BNP: <2.5 pg/mL (ref 0.0–100.0)

## 2019-04-26 LAB — HEMOGLOBIN A1C
Est. average glucose Bld gHb Est-mCnc: 237 mg/dL
Hgb A1c MFr Bld: 9.9 % — ABNORMAL HIGH (ref 4.8–5.6)

## 2019-04-29 MED FILL — FUROSEMIDE 40 MG TAB: 40 | 30 days supply | Qty: 45 | Fill #3

## 2019-05-13 ENCOUNTER — Other Ambulatory Visit: Payer: Self-pay | Admitting: Family Medicine

## 2019-05-13 DIAGNOSIS — I5022 Chronic systolic (congestive) heart failure: Secondary | ICD-10-CM

## 2019-05-13 DIAGNOSIS — R059 Cough, unspecified: Secondary | ICD-10-CM

## 2019-05-13 DIAGNOSIS — R05 Cough: Secondary | ICD-10-CM

## 2019-05-13 MED FILL — ROSUVASTATIN CALCIUM 40 MG: 40 | 30 days supply | Qty: 30 | Fill #3

## 2019-05-13 MED FILL — CARVEDILOL 12.5 MG TABLET: 12.5 | 30 days supply | Qty: 60 | Fill #1

## 2019-05-13 MED FILL — SPIRONOLACTONE 25 MG TABLET: 25 | 30 days supply | Qty: 30 | Fill #3

## 2019-05-14 MED FILL — POTASSIUM CHLORIDE ER 10 ME: 10 | 90 days supply | Qty: 90 | Fill #0

## 2019-05-16 ENCOUNTER — Other Ambulatory Visit: Payer: Self-pay

## 2019-05-16 ENCOUNTER — Ambulatory Visit: Payer: Medicaid Other | Attending: Family Medicine | Admitting: Family Medicine

## 2019-05-16 ENCOUNTER — Encounter: Payer: Self-pay | Admitting: Family Medicine

## 2019-05-16 VITALS — BP 149/94 | HR 82 | Temp 99.0°F | Ht 67.0 in | Wt 203.6 lb

## 2019-05-16 DIAGNOSIS — I428 Other cardiomyopathies: Secondary | ICD-10-CM | POA: Diagnosis not present

## 2019-05-16 DIAGNOSIS — R059 Cough, unspecified: Secondary | ICD-10-CM

## 2019-05-16 DIAGNOSIS — E785 Hyperlipidemia, unspecified: Secondary | ICD-10-CM | POA: Diagnosis not present

## 2019-05-16 DIAGNOSIS — I110 Hypertensive heart disease with heart failure: Secondary | ICD-10-CM | POA: Insufficient documentation

## 2019-05-16 DIAGNOSIS — Z9119 Patient's noncompliance with other medical treatment and regimen: Secondary | ICD-10-CM | POA: Insufficient documentation

## 2019-05-16 DIAGNOSIS — I5022 Chronic systolic (congestive) heart failure: Secondary | ICD-10-CM

## 2019-05-16 DIAGNOSIS — E1169 Type 2 diabetes mellitus with other specified complication: Secondary | ICD-10-CM | POA: Diagnosis not present

## 2019-05-16 DIAGNOSIS — Z91199 Patient's noncompliance with other medical treatment and regimen due to unspecified reason: Secondary | ICD-10-CM

## 2019-05-16 DIAGNOSIS — Z95810 Presence of automatic (implantable) cardiac defibrillator: Secondary | ICD-10-CM | POA: Insufficient documentation

## 2019-05-16 DIAGNOSIS — Z794 Long term (current) use of insulin: Secondary | ICD-10-CM

## 2019-05-16 DIAGNOSIS — E1165 Type 2 diabetes mellitus with hyperglycemia: Secondary | ICD-10-CM

## 2019-05-16 DIAGNOSIS — R05 Cough: Secondary | ICD-10-CM | POA: Insufficient documentation

## 2019-05-16 DIAGNOSIS — Z9114 Patient's other noncompliance with medication regimen: Secondary | ICD-10-CM | POA: Diagnosis not present

## 2019-05-16 DIAGNOSIS — Z79899 Other long term (current) drug therapy: Secondary | ICD-10-CM | POA: Insufficient documentation

## 2019-05-16 DIAGNOSIS — I2510 Atherosclerotic heart disease of native coronary artery without angina pectoris: Secondary | ICD-10-CM | POA: Diagnosis not present

## 2019-05-16 DIAGNOSIS — Z7982 Long term (current) use of aspirin: Secondary | ICD-10-CM | POA: Diagnosis not present

## 2019-05-16 DIAGNOSIS — I10 Essential (primary) hypertension: Secondary | ICD-10-CM

## 2019-05-16 DIAGNOSIS — E1142 Type 2 diabetes mellitus with diabetic polyneuropathy: Secondary | ICD-10-CM | POA: Diagnosis not present

## 2019-05-16 DIAGNOSIS — Z8249 Family history of ischemic heart disease and other diseases of the circulatory system: Secondary | ICD-10-CM | POA: Diagnosis not present

## 2019-05-16 LAB — GLUCOSE, POCT (MANUAL RESULT ENTRY): POC Glucose: 390 mg/dl — AB (ref 70–99)

## 2019-05-16 MED ORDER — INSULIN GLARGINE 100 UNIT/ML SOLOSTAR PEN: 45.0000 [IU] | PEN_INJECTOR | Freq: Two times a day (BID) | SUBCUTANEOUS | 3 refills | Status: DC

## 2019-05-16 MED ORDER — CARVEDILOL 12.5 MG PO TABS: 12.5000 mg | ORAL_TABLET | Freq: Two times a day (BID) | ORAL | 3 refills | Status: DC

## 2019-05-16 MED ORDER — GABAPENTIN 400 MG PO CAPS: ORAL_CAPSULE | ORAL | 3 refills | Status: DC

## 2019-05-16 MED ORDER — CETIRIZINE HCL 10 MG PO TABS: 10.0000 mg | ORAL_TABLET | Freq: Every day | ORAL | 1 refills | Status: DC

## 2019-05-16 MED ORDER — SPIRONOLACTONE 25 MG PO TABS: 25.0000 mg | ORAL_TABLET | Freq: Every day | ORAL | 3 refills | Status: DC

## 2019-05-16 MED ORDER — FUROSEMIDE 40 MG PO TABS: 60.0000 mg | ORAL_TABLET | Freq: Every day | ORAL | 3 refills | Status: DC

## 2019-05-16 MED ORDER — ROSUVASTATIN CALCIUM 40 MG PO TABS: 40.0000 mg | ORAL_TABLET | Freq: Every day | ORAL | 3 refills | Status: DC

## 2019-05-16 MED FILL — LANTUS SOLOSTAR 100 UNITS/M: 100 | 16 days supply | Qty: 15 | Fill #0

## 2019-05-16 MED FILL — CETIRIZINE HCL 10 MG TABS: 10 | 30 days supply | Qty: 30 | Fill #0

## 2019-05-16 MED FILL — GABAPENTIN 400 MG CAPSULE: 400 | 30 days supply | Qty: 90 | Fill #0

## 2019-05-16 NOTE — Patient Instructions (Signed)

## 2019-05-16 NOTE — Progress Notes (Signed)
Subjective:  Patient ID: Jeffrey [name], male    DOB: 06/17/1966  Age: 53 y.o. MRN: [id number]  CC: Diabetes   HPI Jeffrey [name] is a 53 year old male with a history of type 2 diabetes mellitus (A1c 9.9), diabetic neuropathy, status post right great and second toe amputation , hypertension, Nonischemic cardiomyopathy, CHF (EF 20 % from 2-D echo 01/2018 status post ICD placement in 01/2018) who is seen today for a follow-up visit. He had complained of cough at his last office visit and increased weight but his weight today is 203 pounds and 5 months ago it was 215 pounds.  BNP was less than 2.5.  Cough is dry and not associated with sinus drainage or sinus pressure or dyspnea. I have reviewed his medications with him he seems his last prescription of Lasix was in 10/2018 and he had 3 refills at the time which indicates he could have run out of his Lasix but he informs me he has been compliant with his medications.  He does not adhere to fluid restriction, low-sodium diet. And has not had cardiology follow-up in a while.  He is also not compliant with his diabetic diet and drinks a lot of sodas hence increasing A1c from 7.2 , six months ago to 9.9 now.  I had reduced the dose of his Lantus last week when his A1c returned at 9.9.  Past Medical History:  Diagnosis Date  . CAD (coronary artery disease)    a. cath 01/31/17: 60% 1st RPLB, 60% dist RCA, 55% prox RCA, 10% pro LAD --> Rx TX.   Marland Kitchen Chronic systolic CHF (congestive heart failure) ([place]) 01/28/2017   1. Echo 01/29/17:  EF 20-25, normal wall motion, mild LAE // 2. EF 10-15 by [facility] 01/2017   . Diabetes mellitus   . Diabetic foot infection ([place]) 03/2016   RT FOOT  . HTN (hypertension)   . Hyperlipidemia   . NICM (nonischemic cardiomyopathy) ([place]) 02/15/2017   1. Mod non-obs CAD on LHC in 01/2017 - CAD does not explain cardiomyopathy    Past Surgical History:  Procedure Laterality Date  . AMPUTATION Right 04/01/2016   Procedure: Right Great  Toe Amputation;  Surgeon: Newt Minion, MD;  Location: [facility];  Service: Orthopedics;  Laterality: Right;  . AMPUTATION Right 06/19/2016   Procedure: AMPUTATION SECOND TOE;  Surgeon: Marybelle Killings, MD;  Location: [place];  Service: Orthopedics;  Laterality: Right;  . AMPUTATION TOE Right   . BACK SURGERY     for abscess  . ICD IMPLANT N/A 01/15/2018   Procedure: ICD IMPLANT;  Surgeon: Deboraha Sprang, MD;  Location: [place] [facility];  Service: Cardiovascular;  Laterality: N/A;  . RIGHT/LEFT HEART CATH AND CORONARY ANGIOGRAPHY N/A 01/31/2017   Procedure: Right/Left Heart Cath and Coronary Angiography;  Surgeon: Troy Sine, MD;  Location: [place] [facility];  Service: Cardiovascular;  Laterality: N/A;    Family History  Problem Relation Age of Onset  . Diabetes Mother   . Hypertension Mother   . Diabetes Father   . Heart attack Father   . Diabetes Sister   . Heart attack Maternal Grandmother     No Known Allergies  Outpatient Medications Prior to Visit  Medication Sig Dispense Refill  . acetaminophen (TYLENOL) 500 MG tablet Take 1,000 mg by mouth every 6 (six) hours as needed for headache (pain).    Marland Kitchen albuterol (PROVENTIL HFA;VENTOLIN HFA) 108 (90 Base) MCG/ACT inhaler Inhale 1-2 puffs into the  lungs every 6 (six) hours as needed for wheezing or shortness of breath. 1 Inhaler 0  . aspirin EC 81 MG EC tablet Take 1 tablet (81 mg total) by mouth daily. 30 tablet 0  . benzonatate (TESSALON) 100 MG capsule TAKE 1 CAPSULE BY MOUTH 3 TIMES DAILY AS NEEDED FOR COUGH. 30 capsule 0  . Blood Glucose Monitoring Suppl (TRUE METRIX METER) DEVI 1 each by Does not apply route 3 (three) times daily before meals. 1 Device 0  . cyclobenzaprine (FLEXERIL) 5 MG tablet Take 1 tablet (5 mg total) by mouth at bedtime as needed for muscle spasms. 30 tablet 0  . ferrous sulfate 325 (65 FE) MG tablet Take 1 tablet (325 mg total) by mouth 2 (two) times daily with a meal. (Patient taking differently: Take 325 mg by  mouth daily with breakfast. ) 60 tablet 3  . glucose blood (TRUE METRIX BLOOD GLUCOSE TEST) test strip Use 3 times daily before meals 100 each 12  . insulin aspart (NOVOLOG) 100 UNIT/ML injection Inject 0-15 Units into the skin 3 (three) times daily with meals. Sliding scale  CBG 70 - 120: 0 units: CBG 121 - 140: 2 units; CBG 140 - 200: 4 units; CBG 201 - 240: 6 units; CBG 240 - 300: 8 units;CBG 300 - 350: 12 units; CBG 351 - 400: 16 units; CBG > 400 : 16 units and notify your  MD 10 mL 3  . Insulin Pen Needle (TRUEPLUS PEN NEEDLES) 32G X 4 MM MISC Use to inject Lantus daily. Must use new pen needle with each injection. 100 each 11  . potassium chloride (K-DUR) 10 MEQ tablet TAKE 1 TABLET (10 MEQ TOTAL) BY MOUTH DAILY. 30 tablet 3  . sacubitril-valsartan (ENTRESTO) 49-51 MG Take 1 tablet by mouth 2 (two) times daily. 60 tablet 11  . traZODone (DESYREL) 100 MG tablet Take 1 tablet (100 mg total) by mouth at bedtime. For sleep 30 tablet 2  . TRUEPLUS LANCETS 28G MISC 1 each by Does not apply route 3 (three) times daily before meals. 100 each 12  . carvedilol (COREG) 12.5 MG tablet Take 1 tablet (12.5 mg total) by mouth 2 (two) times daily with a meal. 60 tablet 3  . gabapentin (NEURONTIN) 400 MG capsule TAKE 1 CAPSULE BY MOUTH 3 TAKE TIMES DAILY. 90 capsule 3  . LANTUS SOLOSTAR 100 UNIT/ML Solostar Pen INJECT 40 UNITS INTO THE SKIN 2 (TWO) TIMES DAILY. 15 mL 1  . rosuvastatin (CRESTOR) 40 MG tablet Take 1 tablet (40 mg total) by mouth daily. 30 tablet 3  . spironolactone (ALDACTONE) 25 MG tablet Take 1 tablet (25 mg total) by mouth daily. 30 tablet 3  . furosemide (LASIX) 40 MG tablet Take 1.5 tablets (60 mg total) by mouth daily. 45 tablet 3   No facility-administered medications prior to visit.      ROS Review of Systems  Constitutional: Negative for activity change and appetite change.  HENT: Negative for sinus pressure and sore throat.   Eyes: Negative for visual disturbance.  Respiratory:  Negative for cough, chest tightness and shortness of breath.   Cardiovascular: Negative for chest pain and leg swelling.  Gastrointestinal: Negative for abdominal distention, abdominal pain, constipation and diarrhea.  Endocrine: Negative.   Genitourinary: Negative for dysuria.  Musculoskeletal: Negative for joint swelling and myalgias.  Skin: Negative for rash.  Allergic/Immunologic: Negative.   Neurological: Negative for weakness, light-headedness and numbness.  Psychiatric/Behavioral: Negative for dysphoric mood and suicidal ideas.  Objective:  BP (!) 149/94   Pulse 82   Temp 99 F (37.2 C) (Oral)   Ht 5\' 7"  (1.702 m)   Wt 203 lb 9.6 oz (92.4 kg)   SpO2 99%   BMI 31.89 kg/m   BP/Weight 05/16/2019 10/24/2018 62/02/7627  Systolic BP 315 176 160  Diastolic BP 94 74 94  Wt. (Lbs) 203.6 215.6 -  BMI 31.89 33.77 -      Physical Exam Constitutional:      Appearance: He is well-developed.  Neck:     Comments: +JVD Cardiovascular:     Rate and Rhythm: Normal rate.     Heart sounds: Normal heart sounds. No murmur.  Pulmonary:     Effort: Pulmonary effort is normal.     Breath sounds: Normal breath sounds. No wheezing or rales.  Chest:     Chest wall: No tenderness.  Abdominal:     General: Bowel sounds are normal. There is no distension.     Palpations: Abdomen is soft. There is no mass.     Tenderness: There is no abdominal tenderness.  Musculoskeletal: Normal range of motion.     Right lower leg: No edema.     Left lower leg: No edema.  Neurological:     Mental Status: He is alert and oriented to person, place, and time.     CMP Latest Ref Rng & Units 04/25/2019 10/17/2018 10/16/2018  Glucose 65 - 99 mg/dL 89 158(H) 127(H)  BUN 6 - 24 mg/dL 25(H) 18 20  Creatinine 0.76 - 1.27 mg/dL 1.39(H) 1.24 0.90  Sodium 134 - 144 mmol/L 139 140 141  Potassium 3.5 - 5.2 mmol/L 3.8 3.2(L) 3.7  Chloride 96 - 106 mmol/L 105 108 103  CO2 20 - 29 mmol/L 21 24 -  Calcium 8.7 -  10.2 mg/dL 9.7 9.2 -  Total Protein 6.0 - 8.5 g/dL 7.5 - -  Total Bilirubin 0.0 - 1.2 mg/dL <0.2 - -  Alkaline Phos 39 - 117 IU/L 83 - -  AST 0 - 40 IU/L 17 - -  ALT 0 - 44 IU/L 17 - -    Lipid Panel     Component Value Date/Time   CHOL 137 04/25/2019 0925   TRIG 218 (H) 04/25/2019 0925   HDL 36 (L) 04/25/2019 0925   CHOLHDL 3.8 04/25/2019 0925   CHOLHDL 4.9 02/01/2017 0259   VLDL 42 (H) 02/01/2017 0259   LDLCALC 57 04/25/2019 0925    CBC    Component Value Date/Time   WBC 5.7 10/17/2018 0352   RBC 5.17 10/17/2018 0352   HGB 12.0 (L) 10/17/2018 0352   HCT 39.9 10/17/2018 0352   PLT 260 10/17/2018 0352   MCV 77.2 (L) 10/17/2018 0352   MCH 23.2 (L) 10/17/2018 0352   MCHC 30.1 10/17/2018 0352   RDW 15.3 10/17/2018 0352   LYMPHSABS 1.8 01/16/2018 0436   MONOABS 0.4 01/16/2018 0436   EOSABS 0.1 01/16/2018 0436   BASOSABS 0.0 01/16/2018 0436    Lab Results  Component Value Date   HGBA1C 9.9 (H) 04/25/2019    Assessment & Plan:   1. Uncontrolled type 2 diabetes mellitus with hyperglycemia, with long-term current use of insulin (HCC) Uncontrolled with A1c of 9.9 due to noncompliance Lantus had been increased 1 week ago Emphasized the need to comply with a diabetic diet and lifestyle modification - POCT glucose (manual entry) - Insulin Glargine (LANTUS SOLOSTAR) 100 UNIT/ML Solostar Pen; Inject 45 Units into the skin 2 (two) times  daily.  Dispense: 15 mL; Refill: 3  2. Chronic systolic CHF (congestive heart failure) (HCC) EF 20% Slight evidence of fluid overload Refilled Lasix which I am not sure he has been compliant with Emphasized the need to comply with fluid restriction of 2 L/day, daily weight checks Schedule appointment with cardiology - furosemide (LASIX) 40 MG tablet; Take 1.5 tablets (60 mg total) by mouth daily.  Dispense: 45 tablet; Refill: 3 - spironolactone (ALDACTONE) 25 MG tablet; Take 1 tablet (25 mg total) by mouth daily.  Dispense: 30 tablet;  Refill: 3  3. Type 2 diabetes mellitus with other specified complication, with long-term current use of insulin (HCC) See #1 above - rosuvastatin (CRESTOR) 40 MG tablet; Take 1 tablet (40 mg total) by mouth daily.  Dispense: 30 tablet; Refill: 3  4. Essential hypertension Slightly elevated No regimen change today Counseled on blood pressure goal of less than 130/80, low-sodium, DASH diet, medication compliance, 150 minutes of moderate intensity exercise per week. Discussed medication compliance, adverse effects. - carvedilol (COREG) 12.5 MG tablet; Take 1 tablet (12.5 mg total) by mouth 2 (two) times daily with a meal.  Dispense: 60 tablet; Refill: 3  5. Diabetic polyneuropathy associated with type 2 diabetes mellitus (HCC) Stable - gabapentin (NEURONTIN) 400 MG capsule; TAKE 1 CAPSULE BY MOUTH 3 TAKE TIMES DAILY.  Dispense: 90 capsule; Refill: 3  6. Non-compliance Counseled on the need to be more compliant to prevent exacerbations of his chronic medical conditions  7. Cough We will treat presumptively for sinus etiology - cetirizine (ZYRTEC) 10 MG tablet; Take 1 tablet (10 mg total) by mouth daily.  Dispense: 30 tablet; Refill: 1   Meds ordered this encounter  Medications  . Insulin Glargine (LANTUS SOLOSTAR) 100 UNIT/ML Solostar Pen    Sig: Inject 45 Units into the skin 2 (two) times daily.    Dispense:  15 mL    Refill:  3  . cetirizine (ZYRTEC) 10 MG tablet    Sig: Take 1 tablet (10 mg total) by mouth daily.    Dispense:  30 tablet    Refill:  1  . furosemide (LASIX) 40 MG tablet    Sig: Take 1.5 tablets (60 mg total) by mouth daily.    Dispense:  45 tablet    Refill:  3  . rosuvastatin (CRESTOR) 40 MG tablet    Sig: Take 1 tablet (40 mg total) by mouth daily.    Dispense:  30 tablet    Refill:  3  . spironolactone (ALDACTONE) 25 MG tablet    Sig: Take 1 tablet (25 mg total) by mouth daily.    Dispense:  30 tablet    Refill:  3  . carvedilol (COREG) 12.5 MG tablet     Sig: Take 1 tablet (12.5 mg total) by mouth 2 (two) times daily with a meal.    Dispense:  60 tablet    Refill:  3  . gabapentin (NEURONTIN) 400 MG capsule    Sig: TAKE 1 CAPSULE BY MOUTH 3 TAKE TIMES DAILY.    Dispense:  90 capsule    Refill:  3    Follow-up: Return in about 3 months (around 08/16/2019) for medical conditions.       Charlott Rakes, MD, FAAFP. [facility] and [place], [place]   05/16/2019, 11:30 AM

## 2019-05-29 ENCOUNTER — Other Ambulatory Visit: Payer: Self-pay | Admitting: Internal Medicine

## 2019-05-29 MED FILL — ENTRESTO 49 MG-51 MG TABLET: 49-51 | 30 days supply | Qty: 60 | Fill #0

## 2019-06-03 ENCOUNTER — Telehealth: Payer: Self-pay | Admitting: Family Medicine

## 2019-06-03 NOTE — Telephone Encounter (Signed)
paperwork has been received and patient will be called once ready for pick up.

## 2019-06-03 NOTE — Telephone Encounter (Signed)
New Message   Pt dropped off some paperwork to be signed. Paperwork was placed in Dr. Smitty Pluck Box

## 2019-06-10 ENCOUNTER — Other Ambulatory Visit: Payer: Self-pay

## 2019-06-10 ENCOUNTER — Ambulatory Visit: Payer: Medicaid Other | Attending: Family Medicine | Admitting: Family Medicine

## 2019-06-10 DIAGNOSIS — E1165 Type 2 diabetes mellitus with hyperglycemia: Secondary | ICD-10-CM | POA: Diagnosis not present

## 2019-06-10 DIAGNOSIS — Z794 Long term (current) use of insulin: Secondary | ICD-10-CM

## 2019-06-10 DIAGNOSIS — I5042 Chronic combined systolic (congestive) and diastolic (congestive) heart failure: Secondary | ICD-10-CM | POA: Diagnosis not present

## 2019-06-10 MED FILL — LANTUS SOLOSTAR 100 UNITS/M: 100 | 19 days supply | Qty: 15 | Fill #1

## 2019-06-10 NOTE — Progress Notes (Signed)
Patient has been called and DOB has been verified. Patient has been screened and transferred to PCP to start phone visit.     

## 2019-06-10 NOTE — Progress Notes (Signed)
Virtual Visit via Telephone Note  I connected with Jeffrey [name], on 06/10/2019 at 4:17 PM by telephone due to the COVID-19 pandemic and verified that I am speaking with the correct person using two identifiers.   Consent: I discussed the limitations, risks, security and privacy concerns of performing an evaluation and management service by telephone and the availability of in person appointments. I also discussed with the patient that there may be a patient responsible charge related to this service. The patient expressed understanding and agreed to proceed.   Location of Patient: Environmental education officer of Provider: Clinic   Persons participating in Telemedicine visit: [name] Dr. Alisa Graff- care giver    History of Present Illness: Jeffrey [name] is a 53 year old male with a history of type 2 diabetes mellitus (A1c 9.9), diabetic neuropathy, status post right great and second toe amputation , hypertension, Nonischemic cardiomyopathy, CHF (EF 20 % from 2-D echo 01/2018 status post ICD placement in 01/2018) who is seen today for a telehealth visit for completion of his disability paperwork. 3 weeks ago he had a visit for management of his chronic medical conditions. 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  Most of the information is provided by Jeffrey [name] who is his caregiver.   Past Medical History:  Diagnosis Date  . CAD (coronary artery disease)    a. cath 01/31/17: 60% 1st RPLB, 60% dist RCA, 55% prox RCA, 10% pro LAD --> Rx TX.   Marland Kitchen Chronic systolic CHF (congestive heart failure) ([place]) 01/28/2017   1. Echo 01/29/17:  EF 20-25, normal wall motion, mild LAE // 2. EF 10-15 by [facility] 01/2017   . Diabetes mellitus   . Diabetic foot infection ([place]) 03/2016   RT FOOT  . HTN (hypertension)   .  Hyperlipidemia   . NICM (nonischemic cardiomyopathy) ([place]) 02/15/2017   1. Mod non-obs CAD on LHC in 01/2017 - CAD does not explain cardiomyopathy   No Known Allergies  Current Outpatient Medications on File Prior to Visit  Medication Sig Dispense Refill  . acetaminophen (TYLENOL) 500 MG tablet Take 1,000 mg by mouth every 6 (six) hours as needed for headache (pain).    Marland Kitchen albuterol (PROVENTIL HFA;VENTOLIN HFA) 108 (90 Base) MCG/ACT inhaler Inhale 1-2 puffs into the lungs every 6 (six) hours as needed for wheezing or shortness of breath. 1 Inhaler 0  . aspirin EC 81 MG EC tablet Take 1 tablet (81 mg total) by mouth daily. 30 tablet 0  . benzonatate (TESSALON) 100 MG capsule TAKE 1 CAPSULE BY MOUTH 3 TIMES DAILY AS NEEDED FOR COUGH. 30 capsule 0  . Blood Glucose Monitoring Suppl (TRUE METRIX METER) DEVI 1 each by Does not apply route 3 (three) times daily before meals. 1 Device 0  . carvedilol (COREG) 12.5 MG tablet Take 1 tablet (12.5 mg total) by mouth 2 (two) times daily with a meal. 60 tablet 3  . cetirizine (ZYRTEC) 10 MG tablet Take 1 tablet (10 mg total) by mouth daily. 30 tablet 1  . cyclobenzaprine (FLEXERIL) 5 MG tablet Take 1 tablet (5 mg total) by mouth at bedtime as needed for muscle spasms. 30 tablet 0  . ENTRESTO 49-51 MG TAKE 1 TABLET BY MOUTH 2 TIMES DAILY. 60 tablet 0  . ferrous sulfate 325 (65 FE) MG tablet Take 1 tablet (  325 mg total) by mouth 2 (two) times daily with a meal. (Patient taking differently: Take 325 mg by mouth daily with breakfast. ) 60 tablet 3  . furosemide (LASIX) 40 MG tablet Take 1.5 tablets (60 mg total) by mouth daily. 45 tablet 3  . gabapentin (NEURONTIN) 400 MG capsule TAKE 1 CAPSULE BY MOUTH 3 TAKE TIMES DAILY. 90 capsule 3  . glucose blood (TRUE METRIX BLOOD GLUCOSE TEST) test strip Use 3 times daily before meals 100 each 12  . insulin aspart (NOVOLOG) 100 UNIT/ML injection Inject 0-15 Units into the skin 3 (three) times daily with meals. Sliding scale   CBG 70 - 120: 0 units: CBG 121 - 140: 2 units; CBG 140 - 200: 4 units; CBG 201 - 240: 6 units; CBG 240 - 300: 8 units;CBG 300 - 350: 12 units; CBG 351 - 400: 16 units; CBG > 400 : 16 units and notify your  MD 10 mL 3  . Insulin Glargine (LANTUS SOLOSTAR) 100 UNIT/ML Solostar Pen Inject 45 Units into the skin 2 (two) times daily. 15 mL 3  . Insulin Pen Needle (TRUEPLUS PEN NEEDLES) 32G X 4 MM MISC Use to inject Lantus daily. Must use new pen needle with each injection. 100 each 11  . potassium chloride (K-DUR) 10 MEQ tablet TAKE 1 TABLET (10 MEQ TOTAL) BY MOUTH DAILY. 30 tablet 3  . rosuvastatin (CRESTOR) 40 MG tablet Take 1 tablet (40 mg total) by mouth daily. 30 tablet 3  . spironolactone (ALDACTONE) 25 MG tablet Take 1 tablet (25 mg total) by mouth daily. 30 tablet 3  . traZODone (DESYREL) 100 MG tablet Take 1 tablet (100 mg total) by mouth at bedtime. For sleep 30 tablet 2  . TRUEPLUS LANCETS 28G MISC 1 each by Does not apply route 3 (three) times daily before meals. 100 each 12   No current facility-administered medications on file prior to visit.     Observations/Objective: Awake, alert, oriented x3 Not in acute distress  Assessment and Plan: 1. Chronic combined systolic (congestive) and diastolic (congestive) heart failure (HCC) Status post ICD, EF of 20% NYHA III He continues to experience cough despite adjustment of his Lasix Advised he would need to schedule an appointment with his cardiologist whom he has not seen in a while  2. Uncontrolled type 2 diabetes mellitus with hyperglycemia, with long-term current use of insulin (HCC) Uncontrolled Regimen was adjusted at last office visit.   Follow Up Instructions: Keep previously scheduled appointment   I discussed the assessment and treatment plan with the patient. The patient was provided an opportunity to ask questions and all were answered. The patient agreed with the plan and demonstrated an understanding of the  instructions.   The patient was advised to call back or seek an in-person evaluation if the symptoms worsen or if the condition fails to improve as anticipated.     I provided 15 minutes total of non-face-to-face time during this encounter including median intraservice time, reviewing previous notes, labs, imaging, medications, management and patient verbalized understanding.     Charlott Rakes, MD, FAAFP. [facility] and [place], [place]   06/10/2019, 4:17 PM

## 2019-06-21 ENCOUNTER — Telehealth: Payer: Self-pay | Admitting: Internal Medicine

## 2019-06-21 NOTE — Telephone Encounter (Signed)
New Message ° ° ° °Left message to confirm appt and answer covid questions  °

## 2019-06-21 NOTE — Telephone Encounter (Signed)
Follow up       COVID-19 Pre-Screening Questions:   In the past 7 to 10 days have you had a cough,  shortness of breath, headache, congestion, fever (100 or greater) body aches, chills, sore throat, or sudden loss of taste or sense of smell? Cough, SOB says he has had these symptoms for many months and the PC says it could be from a medication   Have you been around anyone with known Covid 19. NO  Have you been around anyone who is awaiting Covid 19 test results in the past 7 to 10 days? NO  Have you been around anyone who has been exposed to Covid 19, or has mentioned symptoms of Covid 19 within the past 7 to 10 days? NO  If you have any concerns/questions about symptoms patients report during screening (either on the phone or at threshold). Contact the provider seeing the patient or DOD for further guidance.  If neither are available contact a member of the leadership team.

## 2019-06-24 ENCOUNTER — Ambulatory Visit (INDEPENDENT_AMBULATORY_CARE_PROVIDER_SITE_OTHER): Payer: Medicaid Other | Admitting: Internal Medicine

## 2019-06-24 ENCOUNTER — Other Ambulatory Visit: Payer: Self-pay

## 2019-06-24 ENCOUNTER — Encounter: Payer: Self-pay | Admitting: Internal Medicine

## 2019-06-24 VITALS — BP 122/80 | HR 84 | Ht 67.0 in | Wt 208.4 lb

## 2019-06-24 DIAGNOSIS — I472 Ventricular tachycardia, unspecified: Secondary | ICD-10-CM

## 2019-06-24 DIAGNOSIS — I428 Other cardiomyopathies: Secondary | ICD-10-CM

## 2019-06-24 DIAGNOSIS — Z95810 Presence of automatic (implantable) cardiac defibrillator: Secondary | ICD-10-CM | POA: Diagnosis not present

## 2019-06-24 LAB — CUP PACEART INCLINIC DEVICE CHECK
Battery Remaining Longevity: 122 mo
Battery Voltage: 3.02 V
Brady Statistic RV Percent Paced: 0.02 %
Date Time Interrogation Session: 20200713151828
HighPow Impedance: 58 Ohm
Implantable Lead Implant Date: 20190204
Implantable Lead Location: 753860
Implantable Pulse Generator Implant Date: 20190204
Lead Channel Impedance Value: 323 Ohm
Lead Channel Impedance Value: 342 Ohm
Lead Channel Pacing Threshold Amplitude: 1 V
Lead Channel Pacing Threshold Pulse Width: 0.4 ms
Lead Channel Sensing Intrinsic Amplitude: 11.25 mV
Lead Channel Sensing Intrinsic Amplitude: 11.875 mV
Lead Channel Setting Pacing Amplitude: 2.5 V
Lead Channel Setting Pacing Pulse Width: 0.4 ms
Lead Channel Setting Sensing Sensitivity: 0.3 mV

## 2019-06-24 NOTE — Progress Notes (Addendum)
Patient Care Team: Charlott Rakes, MD as PCP - General (Family Medicine) Nahser, Wonda Cheng, MD as PCP - Cardiology (Cardiology)   HPI  Jeffrey [name] is a 53 y.o. male Seen in follow-up for an ICD implanted 2/19 for syncope in the setting of nonischemic cardiomyopathy.  He has concomitant nonobstructive coronary disease.  DATE TEST EF   2/18 LHC 10-15% CAD nonobstructive  2/18 Echo   20 %   2/19 Echo   20 % LAE//RV systolic dysfn        Date Cr K Hgb  4/19 0.99 4.3 13.0   5/20 1.39 3.8     The patient denies chest pain, shortness of breath, nocturnal dyspnea, orthopnea or peripheral edema.  There have been no palpitations, lightheadedness or syncope.   He does have a cough, about a month,  No fever  Taking tessalon and inhalers and followed by PCP   Past Medical History:  Diagnosis Date   CAD (coronary artery disease)    a. cath 01/31/17: 60% 1st RPLB, 60% dist RCA, 55% prox RCA, 10% pro LAD --> Rx TX.    Chronic systolic CHF (congestive heart failure) ([place]) 01/28/2017   1. Echo 01/29/17:  EF 20-25, normal wall motion, mild LAE // 2. EF 10-15 by [facility] 01/2017    Diabetes mellitus    Diabetic foot infection ([place]) 03/2016   RT FOOT   HTN (hypertension)    Hyperlipidemia    NICM (nonischemic cardiomyopathy) ([place]) 02/15/2017   1. Mod non-obs CAD on LHC in 01/2017 - CAD does not explain cardiomyopathy    Past Surgical History:  Procedure Laterality Date   AMPUTATION Right 04/01/2016   Procedure: Right Great Toe Amputation;  Surgeon: Newt Minion, MD;  Location: [place];  Service: Orthopedics;  Laterality: Right;   AMPUTATION Right 06/19/2016   Procedure: AMPUTATION SECOND TOE;  Surgeon: Marybelle Killings, MD;  Location: [place];  Service: Orthopedics;  Laterality: Right;   AMPUTATION TOE Right    BACK SURGERY     for abscess   ICD IMPLANT N/A 01/15/2018   Procedure: ICD IMPLANT;  Surgeon: Deboraha Sprang, MD;  Location: [place] [facility];  Service: Cardiovascular;   Laterality: N/A;   RIGHT/LEFT HEART CATH AND CORONARY ANGIOGRAPHY N/A 01/31/2017   Procedure: Right/Left Heart Cath and Coronary Angiography;  Surgeon: Troy Sine, MD;  Location: [place] [facility];  Service: Cardiovascular;  Laterality: N/A;    Current Meds  Medication Sig   acetaminophen (TYLENOL) 500 MG tablet Take 1,000 mg by mouth every 6 (six) hours as needed for headache (pain).   albuterol (PROVENTIL HFA;VENTOLIN HFA) 108 (90 Base) MCG/ACT inhaler Inhale 1-2 puffs into the lungs every 6 (six) hours as needed for wheezing or shortness of breath.   aspirin EC 81 MG EC tablet Take 1 tablet (81 mg total) by mouth daily.   benzonatate (TESSALON) 100 MG capsule TAKE 1 CAPSULE BY MOUTH 3 TIMES DAILY AS NEEDED FOR COUGH.   Blood Glucose Monitoring Suppl (TRUE METRIX METER) DEVI 1 each by Does not apply route 3 (three) times daily before meals.   carvedilol (COREG) 12.5 MG tablet Take 1 tablet (12.5 mg total) by mouth 2 (two) times daily with a meal.   cetirizine (ZYRTEC) 10 MG tablet Take 1 tablet (10 mg total) by mouth daily.   cyclobenzaprine (FLEXERIL) 5 MG tablet Take 1 tablet (5 mg total) by mouth at bedtime as needed for muscle spasms.  ENTRESTO 49-51 MG TAKE 1 TABLET BY MOUTH 2 TIMES DAILY.   ferrous sulfate 325 (65 FE) MG tablet Take 1 tablet (325 mg total) by mouth 2 (two) times daily with a meal.   furosemide (LASIX) 40 MG tablet Take 1.5 tablets (60 mg total) by mouth daily.   gabapentin (NEURONTIN) 400 MG capsule TAKE 1 CAPSULE BY MOUTH 3 TAKE TIMES DAILY.   glucose blood (TRUE METRIX BLOOD GLUCOSE TEST) test strip Use 3 times daily before meals   insulin aspart (NOVOLOG) 100 UNIT/ML injection Inject 0-15 Units into the skin 3 (three) times daily with meals. Sliding scale  CBG 70 - 120: 0 units: CBG 121 - 140: 2 units; CBG 140 - 200: 4 units; CBG 201 - 240: 6 units; CBG 240 - 300: 8 units;CBG 300 - 350: 12 units; CBG 351 - 400: 16 units; CBG > 400 : 16 units and  notify your  MD   Insulin Glargine (LANTUS SOLOSTAR) 100 UNIT/ML Solostar Pen Inject 45 Units into the skin 2 (two) times daily.   Insulin Pen Needle (TRUEPLUS PEN NEEDLES) 32G X 4 MM MISC Use to inject Lantus daily. Must use new pen needle with each injection.   potassium chloride (K-DUR) 10 MEQ tablet TAKE 1 TABLET (10 MEQ TOTAL) BY MOUTH DAILY.   rosuvastatin (CRESTOR) 40 MG tablet Take 1 tablet (40 mg total) by mouth daily.   spironolactone (ALDACTONE) 25 MG tablet Take 1 tablet (25 mg total) by mouth daily.   traZODone (DESYREL) 100 MG tablet Take 1 tablet (100 mg total) by mouth at bedtime. For sleep   TRUEPLUS LANCETS 28G MISC 1 each by Does not apply route 3 (three) times daily before meals.    No Known Allergies    Review of Systems negative except from HPI and PMH  Physical Exam BP 122/80    Pulse 84    Ht 5\' 7"  (1.702 m)    Wt 208 lb 6.4 oz (94.5 kg)    SpO2 96%    BMI 32.64 kg/m  Well developed and well nourished in no acute distress HENT normal Neck supple with JVP-flat Clear Device pocket well healed; without hematoma or erythema.  There is no tethering  Regular rate and rhythm, no murmur Abd-soft with active BS No Clubbing cyanosis  edema Skin-warm and dry A & Oriented  Grossly normal sensory and motor function  ECG Sinus @ 84 19/08/41   Assessment and  Plan Syncope  NICM  ICD - Medtronic  The patient's device was interrogated.  The information was reviewed. No changes were made in the programming.     CHF chronic systolic  HTN       BP well controlled  Euvolemic    Will continue entresto -- his EF stable but CHF improved  No recurrent syncope  Cough per PCP, no evidence of CHF and no wheezing today           Current medicines are reviewed at length with the patient today .  The patient does not  have concerns regarding medicines.

## 2019-06-24 NOTE — Patient Instructions (Signed)

## 2019-06-25 ENCOUNTER — Other Ambulatory Visit: Payer: Self-pay | Admitting: Pharmacist

## 2019-06-25 MED ORDER — TRAZODONE HCL 100 MG PO TABS: 100.0000 mg | ORAL_TABLET | Freq: Every day | ORAL | 2 refills | Status: DC

## 2019-07-08 MED FILL — CARVEDILOL 12.5 MG TABLET: 12.5 | 30 days supply | Qty: 60 | Fill #2

## 2019-07-08 MED FILL — ROSUVASTATIN CALCIUM 40 MG: 40 | 30 days supply | Qty: 30 | Fill #0

## 2019-07-08 MED FILL — SPIRONOLACTONE 25 MG TABLET: 25 | 30 days supply | Qty: 30 | Fill #0

## 2019-07-08 MED FILL — FUROSEMIDE 40 MG TAB: 40 | 30 days supply | Qty: 45 | Fill #0

## 2019-07-11 ENCOUNTER — Encounter: Payer: Medicaid Other | Admitting: *Deleted

## 2019-07-12 ENCOUNTER — Other Ambulatory Visit: Payer: Self-pay | Admitting: Internal Medicine

## 2019-07-12 ENCOUNTER — Telehealth: Payer: Self-pay

## 2019-07-12 NOTE — Telephone Encounter (Signed)
Spoke with patient to remind of missed remote transmission 

## 2019-07-19 ENCOUNTER — Encounter: Payer: Self-pay | Admitting: Cardiology

## 2019-07-22 MED FILL — ROSUVASTATIN CALCIUM 40 MG: 40 | 30 days supply | Qty: 30 | Fill #0

## 2019-07-22 MED FILL — LANTUS SOLOSTAR 100 UNITS/M: 100 | 16 days supply | Qty: 15 | Fill #0

## 2019-07-22 MED FILL — FUROSEMIDE 40 MG TAB: 40 | 30 days supply | Qty: 45 | Fill #0

## 2019-07-22 MED FILL — SPIRONOLACTONE 25 MG TABLET: 25 | 30 days supply | Qty: 30 | Fill #0

## 2019-07-25 ENCOUNTER — Telehealth: Payer: Self-pay

## 2019-07-25 NOTE — Telephone Encounter (Signed)
Hi Dr Caryl Comes,  Can you add a sentence or 2 to your notes from the pts last OV with you on 06/24/2019 stating that this pt is receiving clinical benefit from [facility] such as stabilization of symptoms, improvement or stability of EF, or a reduction in hospitalizations?  We have to show benefit from [facility] in order for Medicaid to cover.

## 2019-07-30 NOTE — Telephone Encounter (Signed)
I called X 2 but had issues with both calls (Dropped). We are working on the pts [place] PA form and will fax to [place] once it is complete and MD has signed it which will hopefully be tomorrow.

## 2019-07-30 NOTE — Telephone Encounter (Signed)
Follow Up  Jeffrey [name] from [facility] [employer] is calling in stating that a fax was with the [place] [employer] call in form and the information needs to be called in concerning medication Entresto.    Phone Number for call in:  [phone]

## 2019-07-31 ENCOUNTER — Telehealth: Payer: Self-pay

## 2019-07-31 NOTE — Telephone Encounter (Signed)
We have completed the [facility] PA form, Dr Rayann Heman (DOD) has signed it and we faxed it to [place] at [phone].

## 2019-07-31 NOTE — Telephone Encounter (Signed)
Pt brought form for physician to sign and provide date range pt seen. Placed in providers box.

## 2019-08-05 MED FILL — CARVEDILOL 12.5 MG TABLET: 12.5 | 30 days supply | Qty: 60 | Fill #2

## 2019-08-05 MED FILL — ENTRESTO 49 MG-51 MG TABLET: 49-51 | 30 days supply | Qty: 60 | Fill #0

## 2019-09-09 ENCOUNTER — Ambulatory Visit: Payer: Medicaid Other | Admitting: Family Medicine

## 2019-09-29 ENCOUNTER — Emergency Department (HOSPITAL_COMMUNITY): Payer: Medicaid Other

## 2019-09-29 ENCOUNTER — Encounter (HOSPITAL_COMMUNITY): Payer: Self-pay | Admitting: Emergency Medicine

## 2019-09-29 ENCOUNTER — Emergency Department (HOSPITAL_COMMUNITY)
Admission: EM | Admit: 2019-09-29 | Discharge: 2019-09-29 | Disposition: A | Discharge status: Home / Self Care | Payer: Medicaid Other | Attending: Emergency Medicine | Admitting: Emergency Medicine

## 2019-09-29 ENCOUNTER — Other Ambulatory Visit: Payer: Self-pay

## 2019-09-29 DIAGNOSIS — I5042 Chronic combined systolic (congestive) and diastolic (congestive) heart failure: Secondary | ICD-10-CM | POA: Insufficient documentation

## 2019-09-29 DIAGNOSIS — Z79899 Other long term (current) drug therapy: Secondary | ICD-10-CM | POA: Diagnosis not present

## 2019-09-29 DIAGNOSIS — I2510 Atherosclerotic heart disease of native coronary artery without angina pectoris: Secondary | ICD-10-CM | POA: Diagnosis not present

## 2019-09-29 DIAGNOSIS — I959 Hypotension, unspecified: Secondary | ICD-10-CM | POA: Insufficient documentation

## 2019-09-29 DIAGNOSIS — I110 Hypertensive heart disease with heart failure: Secondary | ICD-10-CM | POA: Diagnosis not present

## 2019-09-29 DIAGNOSIS — Z794 Long term (current) use of insulin: Secondary | ICD-10-CM | POA: Insufficient documentation

## 2019-09-29 DIAGNOSIS — E119 Type 2 diabetes mellitus without complications: Secondary | ICD-10-CM | POA: Diagnosis not present

## 2019-09-29 DIAGNOSIS — Z7982 Long term (current) use of aspirin: Secondary | ICD-10-CM | POA: Diagnosis not present

## 2019-09-29 DIAGNOSIS — Z95810 Presence of automatic (implantable) cardiac defibrillator: Secondary | ICD-10-CM | POA: Insufficient documentation

## 2019-09-29 DIAGNOSIS — T50901A Poisoning by unspecified drugs, medicaments and biological substances, accidental (unintentional), initial encounter: Secondary | ICD-10-CM | POA: Insufficient documentation

## 2019-09-29 DIAGNOSIS — R410 Disorientation, unspecified: Secondary | ICD-10-CM | POA: Diagnosis present

## 2019-09-29 LAB — POCT I-STAT EG7
Acid-Base Excess: 1 mmol/L (ref 0.0–2.0)
Bicarbonate: 26.5 mmol/L (ref 20.0–28.0)
Calcium, Ion: 1.2 mmol/L (ref 1.15–1.40)
HCT: 38 % — ABNORMAL LOW (ref 39.0–52.0)
Hemoglobin: 12.9 g/dL — ABNORMAL LOW (ref 13.0–17.0)
O2 Saturation: 87 %
Potassium: 3.1 mmol/L — ABNORMAL LOW (ref 3.5–5.1)
Sodium: 142 mmol/L (ref 135–145)
TCO2: 28 mmol/L (ref 22–32)
pCO2, Ven: 44.2 mmHg (ref 44.0–60.0)
pH, Ven: 7.386 (ref 7.250–7.430)
pO2, Ven: 55 mmHg — ABNORMAL HIGH (ref 32.0–45.0)

## 2019-09-29 LAB — URINALYSIS, ROUTINE W REFLEX MICROSCOPIC
Bilirubin Urine: NEGATIVE
Glucose, UA: NEGATIVE mg/dL
Ketones, ur: NEGATIVE mg/dL
Leukocytes,Ua: NEGATIVE
Nitrite: NEGATIVE
Protein, ur: >=300 mg/dL — AB
Specific Gravity, Urine: 1.015 (ref 1.005–1.030)
pH: 6 (ref 5.0–8.0)

## 2019-09-29 LAB — BASIC METABOLIC PANEL
Anion gap: 10 (ref 5–15)
BUN: 11 mg/dL (ref 6–20)
CO2: 24 mmol/L (ref 22–32)
Calcium: 8.9 mg/dL (ref 8.9–10.3)
Chloride: 106 mmol/L (ref 98–111)
Creatinine, Ser: 1.26 mg/dL — ABNORMAL HIGH (ref 0.61–1.24)
GFR calc Af Amer: >60 mL/min (ref >60)
GFR calc non Af Amer: >60 mL/min (ref >60)
Glucose, Bld: 131 mg/dL — ABNORMAL HIGH (ref 70–99)
Potassium: 3 mmol/L — ABNORMAL LOW (ref 3.5–5.1)
Sodium: 140 mmol/L (ref 135–145)

## 2019-09-29 LAB — TROPONIN I (HIGH SENSITIVITY)
Troponin I (High Sensitivity): 19 ng/L — ABNORMAL HIGH (ref <18)
Troponin I (High Sensitivity): 20 ng/L — ABNORMAL HIGH (ref <18)

## 2019-09-29 LAB — RAPID URINE DRUG SCREEN, HOSP PERFORMED
Amphetamines: NOT DETECTED
Barbiturates: NOT DETECTED
Benzodiazepines: NOT DETECTED
Cocaine: NOT DETECTED
Opiates: NOT DETECTED
Tetrahydrocannabinol: NOT DETECTED

## 2019-09-29 LAB — ETHANOL: Alcohol, Ethyl (B): <10 mg/dL (ref <10)

## 2019-09-29 LAB — HEPATIC FUNCTION PANEL
ALT: 18 U/L (ref 0–44)
AST: 24 U/L (ref 15–41)
Albumin: 3.3 g/dL — ABNORMAL LOW (ref 3.5–5.0)
Alkaline Phosphatase: 72 U/L (ref 38–126)
Bilirubin, Direct: 0.2 mg/dL (ref 0.0–0.2)
Indirect Bilirubin: 0.4 mg/dL (ref 0.3–0.9)
Total Bilirubin: 0.6 mg/dL (ref 0.3–1.2)
Total Protein: 7.2 g/dL (ref 6.5–8.1)

## 2019-09-29 LAB — SALICYLATE LEVEL: Salicylate Lvl: <7 mg/dL (ref 2.8–30.0)

## 2019-09-29 LAB — CBC
HCT: 39.1 % (ref 39.0–52.0)
Hemoglobin: 12.2 g/dL — ABNORMAL LOW (ref 13.0–17.0)
MCH: 25.4 pg — ABNORMAL LOW (ref 26.0–34.0)
MCHC: 31.2 g/dL (ref 30.0–36.0)
MCV: 81.5 fL (ref 80.0–100.0)
Platelets: 248 10*3/uL (ref 150–400)
RBC: 4.8 MIL/uL (ref 4.22–5.81)
RDW: 14.4 % (ref 11.5–15.5)
WBC: 5.4 10*3/uL (ref 4.0–10.5)
nRBC: 0 % (ref 0.0–0.2)

## 2019-09-29 LAB — CBG MONITORING, ED: Glucose-Capillary: 140 mg/dL — ABNORMAL HIGH (ref 70–99)

## 2019-09-29 LAB — ACETAMINOPHEN LEVEL: Acetaminophen (Tylenol), Serum: <10 ug/mL — ABNORMAL LOW (ref 10–30)

## 2019-09-29 LAB — MAGNESIUM: Magnesium: 1.8 mg/dL (ref 1.7–2.4)

## 2019-09-29 MED ORDER — POTASSIUM CHLORIDE CRYS ER 20 MEQ PO TBCR
20.0000 meq | EXTENDED_RELEASE_TABLET | Freq: Once | ORAL | Status: AC
  Administered 2019-09-29: 23:00:00 20 meq via ORAL
  Filled 2019-09-29: qty 1

## 2019-09-29 MED ORDER — SODIUM CHLORIDE 0.9 % IV BOLUS
1000.0000 mL | Freq: Once | INTRAVENOUS | Status: AC
  Administered 2019-09-29: 18:00:00 1000 mL via INTRAVENOUS

## 2019-09-29 MED ORDER — POTASSIUM CHLORIDE CRYS ER 20 MEQ PO TBCR: 40.0000 meq | EXTENDED_RELEASE_TABLET | Freq: Once | ORAL | Status: DC

## 2019-09-29 MED ORDER — NALOXONE HCL 0.4 MG/ML IJ SOLN
0.4000 mg | Freq: Once | INTRAMUSCULAR | Status: AC
  Administered 2019-09-29: 0.4 mg via INTRAVENOUS

## 2019-09-29 MED ORDER — SODIUM CHLORIDE 0.9% FLUSH
3.0000 mL | Freq: Once | INTRAVENOUS | Status: AC
  Administered 2019-09-29: 18:00:00 3 mL via INTRAVENOUS

## 2019-09-29 MED ORDER — NALOXONE HCL 0.4 MG/ML IJ SOLN
INTRAMUSCULAR | Status: AC
  Filled 2019-09-29: qty 1

## 2019-09-29 MED ORDER — POTASSIUM CHLORIDE 10 MEQ/100ML IV SOLN
10.0000 meq | INTRAVENOUS | Status: AC
  Administered 2019-09-29 (×3): 10 meq via INTRAVENOUS
  Filled 2019-09-29 (×3): qty 100

## 2019-09-29 NOTE — Discharge Instructions (Signed)
Please follow-up with your PCP for a recheck this week. We have observed you for over 6 six hours and are reassured that you are back to your baseline.

## 2019-09-29 NOTE — ED Notes (Signed)
Medtronic report:  Last checked on 06/24/19.  Non-sustained VT episode on 07/24/19.  No other arrhythmias noted.

## 2019-09-29 NOTE — ED Triage Notes (Signed)
Pt arrives with gf who reports pt drove to her today and began vomiting. Pt told her that he thinks he took too much of his medicine today but not sure of what. Pt very weak and fatigued.

## 2019-09-29 NOTE — ED Notes (Signed)
Patient verbalizes understanding of discharge instructions. Opportunity for questioning and answers were provided. Armband removed by staff, pt discharged from ED.  

## 2019-09-29 NOTE — ED Provider Notes (Signed)
[facility] EMERGENCY DEPARTMENT Provider Note   CSN: [id number] Arrival date & time: 09/29/19  1638     History   Chief Complaint Chief Complaint  Patient presents with  . Emesis  . Drug Overdose    HPI Jeffrey [name] is a 53 y.o. male.     HPI   The patient is a 53 year old male with a history of NICM (moderate non-obstructive CAD on [facility] in 2018), HTN, HLD, CHF, DM2 who presents after an accidental medication overdose.  The patient was found by his wife 1 hour PTA confused. He stated that he thought he took too much of his home medications. Home medications reviewed and are significant for Aspirin, Coreg, Cetirizine, Flexeril, Entresto, iron tablets, Lasix, Gabapentin, Crestor, Potassium supplements, Spironolactone. The patient also takes insulin in the form of lantus and novolog injections. He also takes trazodone tablets but ran out recently.   The patient arrived to the ED acutely somnolent and altered, only able to call out his name. He was GCS 13 on arrival, ABC intact. His initial BP was 106/69 and then 86/61 so a 1L LR bolus was started.    Past Medical History:  Diagnosis Date  . CAD (coronary artery disease)    a. cath 01/31/17: 60% 1st RPLB, 60% dist RCA, 55% prox RCA, 10% pro LAD --> Rx TX.   Marland Kitchen Chronic systolic CHF (congestive heart failure) ([place]) 01/28/2017   1. Echo 01/29/17:  EF 20-25, normal wall motion, mild LAE // 2. EF 10-15 by [facility] 01/2017   . Diabetes mellitus   . Diabetic foot infection ([place]) 03/2016   RT FOOT  . HTN (hypertension)   . Hyperlipidemia   . NICM (nonischemic cardiomyopathy) ([place]) 02/15/2017   1. Mod non-obs CAD on [facility] in 01/2017 - CAD does not explain cardiomyopathy    Patient Active Problem List   Diagnosis Date Noted  . Hypoglycemia 10/16/2018  . Acute metabolic encephalopathy 0000000  . S/P ICD (internal cardiac defibrillator) procedure 01/19/2018  . Cardiac device in situ   . Syncope 01/13/2018  . NICM  (nonischemic cardiomyopathy) ([place]) 02/15/2017  . Coronary artery disease involving native coronary artery of native heart without angina pectoris 02/15/2017  . Hyperlipidemia 02/15/2017  . Chronic combined systolic (congestive) and diastolic (congestive) heart failure ([place]) 01/28/2017  . Folliculitis [id number]  . 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 Anemia, iron deficiency   . Noncompliance with medication regimen 03/30/2016  . Uncontrolled type 2 diabetes mellitus with hyperglycemia, with long-term current use of insulin ([place])   . Cellulitis 09/25/2015  . Essential hypertension 08/21/2013  . Diabetic neuropathy ([place]) 08/21/2013  . Fungal toenail infection 08/21/2013    Past Surgical History:  Procedure Laterality Date  . AMPUTATION Right 04/01/2016   Procedure: Right Great Toe Amputation;  Surgeon: Newt Minion, MD;  Location: [place];  Service: Orthopedics;  Laterality: Right;  . AMPUTATION Right 06/19/2016   Procedure: AMPUTATION SECOND TOE;  Surgeon: Marybelle Killings, MD;  Location: [place];  Service: Orthopedics;  Laterality: Right;  . AMPUTATION TOE Right   . BACK SURGERY     for abscess  . ICD IMPLANT N/A 01/15/2018   Procedure: ICD IMPLANT;  Surgeon: Deboraha Sprang, MD;  Location: [place] CV LAB;  Service: Cardiovascular;  Laterality: N/A;  . RIGHT/LEFT HEART CATH AND CORONARY ANGIOGRAPHY N/A 01/31/2017   Procedure: Right/Left Heart Cath and Coronary Angiography;  Surgeon: Troy Sine,  MD;  Location: [place] CV LAB;  Service: Cardiovascular;  Laterality: N/A;        Home Medications    Prior to Admission medications   Medication Sig Start Date End Date Taking? Authorizing Provider  acetaminophen (TYLENOL) 500 MG tablet Take 1,000 mg by mouth every 6 (six) hours as needed for headache (pain).    [provider]  albuterol (PROVENTIL HFA;VENTOLIN HFA) 108 (90 Base) MCG/ACT inhaler Inhale 1-2 puffs into the lungs every 6  (six) hours as needed for wheezing or shortness of breath. 11/15/16   Recardo Evangelist, PA-C  aspirin EC 81 MG EC tablet Take 1 tablet (81 mg total) by mouth daily. 01/16/18   Sheikh, Omair Latif, DO  benzonatate (TESSALON) 100 MG capsule TAKE 1 CAPSULE BY MOUTH 3 TIMES DAILY AS NEEDED FOR COUGH. 05/14/19   Charlott Rakes, MD  Blood Glucose Monitoring Suppl (TRUE METRIX METER) DEVI 1 each by Does not apply route 3 (three) times daily before meals. 02/15/17   Charlott Rakes, MD  carvedilol (COREG) 12.5 MG tablet Take 1 tablet (12.5 mg total) by mouth 2 (two) times daily with a meal. 05/16/19   Charlott Rakes, MD  cetirizine (ZYRTEC) 10 MG tablet Take 1 tablet (10 mg total) by mouth daily. 05/16/19   Charlott Rakes, MD  cyclobenzaprine (FLEXERIL) 5 MG tablet Take 1 tablet (5 mg total) by mouth at bedtime as needed for muscle spasms. 04/24/19   Charlott Rakes, MD  ENTRESTO 49-51 MG TAKE 1 TABLET BY MOUTH 2 TIMES DAILY. 07/15/19   Deboraha Sprang, MD  ferrous sulfate 325 (65 FE) MG tablet Take 1 tablet (325 mg total) by mouth 2 (two) times daily with a meal. 04/03/16   Dhungel, Nishant, MD  furosemide (LASIX) 40 MG tablet Take 1.5 tablets (60 mg total) by mouth daily. 05/16/19 08/14/19  Charlott Rakes, MD  gabapentin (NEURONTIN) 400 MG capsule TAKE 1 CAPSULE BY MOUTH 3 TAKE TIMES DAILY. 05/16/19   Charlott Rakes, MD  glucose blood (TRUE METRIX BLOOD GLUCOSE TEST) test strip Use 3 times daily before meals 02/15/17   Charlott Rakes, MD  insulin aspart (NOVOLOG) 100 UNIT/ML injection Inject 0-15 Units into the skin 3 (three) times daily with meals. Sliding scale  CBG 70 - 120: 0 units: CBG 121 - 140: 2 units; CBG 140 - 200: 4 units; CBG 201 - 240: 6 units; CBG 240 - 300: 8 units;CBG 300 - 350: 12 units; CBG 351 - 400: 16 units; CBG > 400 : 16 units and notify your  MD 12/20/17   Charlott Rakes, MD  Insulin Glargine (LANTUS SOLOSTAR) 100 UNIT/ML Solostar Pen Inject 45 Units into the skin 2 (two) times daily. 05/16/19   Charlott Rakes, MD  Insulin Pen Needle (TRUEPLUS PEN NEEDLES) 32G X 4 MM MISC Use to inject Lantus daily. Must use new pen needle with each injection. 09/17/18   Charlott Rakes, MD  potassium chloride (K-DUR) 10 MEQ tablet TAKE 1 TABLET (10 MEQ TOTAL) BY MOUTH DAILY. 05/14/19 08/12/19  Charlott Rakes, MD  rosuvastatin (CRESTOR) 40 MG tablet Take 1 tablet (40 mg total) by mouth daily. 05/16/19   Charlott Rakes, MD  spironolactone (ALDACTONE) 25 MG tablet Take 1 tablet (25 mg total) by mouth daily. 05/16/19   Charlott Rakes, MD  traZODone (DESYREL) 100 MG tablet Take 1 tablet (100 mg total) by mouth at bedtime. For sleep 06/25/19   Charlott Rakes, MD  TRUEPLUS LANCETS 28G MISC 1 each by Does not apply  route 3 (three) times daily before meals. 02/15/17   Charlott Rakes, MD    Family History Family History  Problem Relation Age of Onset  . Diabetes Mother   . Hypertension Mother   . Diabetes Father   . Heart attack Father   . Diabetes Sister   . Heart attack Maternal Grandmother     Social History Social History   Tobacco Use  . Smoking status: Never Smoker  . Smokeless tobacco: Never Used  Substance Use Topics  . Alcohol use: No  . Drug use: No     Allergies   Patient has no known allergies.   Review of Systems Review of Systems  Unable to perform ROS: Mental status change     Physical Exam Updated Vital Signs BP 116/87   Pulse 66   Temp 98.5 F (36.9 C) (Oral)   Resp 15   SpO2 100%   Physical Exam Vitals signs and nursing note reviewed.  Constitutional:      Appearance: He is well-developed.     Comments: Somnolent but arousable to verbal stimuli. Does not follow commands.   HENT:     Head: Normocephalic and atraumatic.  Eyes:     Conjunctiva/sclera: Conjunctivae normal.     Pupils: Pupils are equal, round, and reactive to light.  Neck:     Musculoskeletal: Neck supple.  Cardiovascular:     Rate and Rhythm: Normal rate and regular rhythm.  Pulmonary:     Effort:  Pulmonary effort is normal. No respiratory distress.     Breath sounds: Normal breath sounds.  Abdominal:     Palpations: Abdomen is soft.     Tenderness: There is no abdominal tenderness.  Skin:    General: Skin is warm and dry.     Capillary Refill: Capillary refill takes less than 2 seconds.  Neurological:     Comments: AAOx1. Somnolent. CN II-XII grossly intact. Moving all four extremities spontaneously. Sensation grossly intact.      ED Treatments / Results  Labs (all labs ordered are listed, but only abnormal results are displayed) Labs Reviewed  BASIC METABOLIC PANEL - Abnormal; Notable for the following components:      Result Value   Potassium 3.0 (*)    Glucose, Bld 131 (*)    Creatinine, Ser 1.26 (*)    All other components within normal limits  CBC - Abnormal; Notable for the following components:   Hemoglobin 12.2 (*)    MCH 25.4 (*)    All other components within normal limits  URINALYSIS, ROUTINE W REFLEX MICROSCOPIC - Abnormal; Notable for the following components:   Hgb urine dipstick SMALL (*)    Protein, ur >=300 (*)    Bacteria, UA RARE (*)    All other components within normal limits  HEPATIC FUNCTION PANEL - Abnormal; Notable for the following components:   Albumin 3.3 (*)    All other components within normal limits  ACETAMINOPHEN LEVEL - Abnormal; Notable for the following components:   Acetaminophen (Tylenol), Serum <10 (*)    All other components within normal limits  CBG MONITORING, ED - Abnormal; Notable for the following components:   Glucose-Capillary 140 (*)    All other components within normal limits  POCT I-STAT EG7 - Abnormal; Notable for the following components:   pO2, Ven 55.0 (*)    Potassium 3.1 (*)    HCT 38.0 (*)    Hemoglobin 12.9 (*)    All other components within normal limits  TROPONIN  I (HIGH SENSITIVITY) - Abnormal; Notable for the following components:   Troponin I (High Sensitivity) 19 (*)    All other components  within normal limits  TROPONIN I (HIGH SENSITIVITY) - Abnormal; Notable for the following components:   Troponin I (High Sensitivity) 20 (*)    All other components within normal limits  ETHANOL  SALICYLATE LEVEL  RAPID URINE DRUG SCREEN, HOSP PERFORMED  MAGNESIUM  I-STAT VENOUS BLOOD GAS, ED    EKG EKG Interpretation  Date/Time:  Sunday September 29 2019 16:51:12 EDT Ventricular Rate:  71 PR Interval:  188 QRS Duration: 88 QT Interval:  452 QTC Calculation: 491 R Axis:   2 Text Interpretation:  Normal sinus rhythm T wave abnormality, consider lateral ischemia Prolonged QT Abnormal ECG prolonged QT new since previous Confirmed by Wandra Arthurs [phone]) on 09/29/2019 5:20:24 PM   Radiology Ct Head Wo Contrast  Result Date: 09/29/2019 CLINICAL DATA:  53 year old male with weakness and vomiting. Altered mental status. EXAM: CT HEAD WITHOUT CONTRAST TECHNIQUE: Contiguous axial images were obtained from the base of the skull through the vertex without intravenous contrast. COMPARISON:  Head CT dated 10/16/2018. FINDINGS: Brain: The ventricles and sulci appropriate size for patient's age. Minimal chronic microvascular ischemic changes noted. Subcentimeter left basal ganglia old lacunar infarct noted. There is no acute intracranial hemorrhage. No mass effect or midline shift. No extra-axial fluid collection. Vascular: No hyperdense vessel or unexpected calcification. Skull: Normal. Negative for fracture or focal lesion. Sinuses/Orbits: No acute finding. Other: None IMPRESSION: No acute intracranial pathology. Electronically Signed   By: Anner Crete M.D.   On: 09/29/2019 19:28   Dg Chest Port 1 View  Result Date: 09/29/2019 CLINICAL DATA:  Hypotension, altered mental status EXAM: PORTABLE CHEST 1 VIEW COMPARISON:  10/16/2018 FINDINGS: Left-sided implanted cardiac device in stable positioning. Lung volumes are low with crowding of the central bronchovascular markings. Linear bibasilar  atelectasis. Irregular 1.5 cm opacity in the left suprahilar region. No pleural effusion or pneumothorax. IMPRESSION: 1. Low lung volumes with crowding of the bronchovascular markings. 2. Irregular 1.5 cm opacity in the left suprahilar region. Recommend CT for further correlation. Electronically Signed   By: Davina Poke M.D.   On: 09/29/2019 17:51    Procedures Procedures (including critical care time)  Medications Ordered in ED Medications  sodium chloride flush (NS) 0.9 % injection 3 mL (3 mLs Intravenous Given 09/29/19 1745)  naloxone (NARCAN) injection 0.4 mg (0.4 mg Intravenous Given 09/29/19 1718)  sodium chloride 0.9 % bolus 1,000 mL (0 mLs Intravenous Stopped 09/29/19 1832)  potassium chloride 10 mEq in 100 mL IVPB (10 mEq Intravenous Not Given 09/29/19 2316)  potassium chloride SA (KLOR-CON) CR tablet 20 mEq (20 mEq Oral Given 09/29/19 2321)     Initial Impression / Assessment and Plan / ED Course  I have reviewed the triage vital signs and the nursing notes.  Pertinent labs & imaging results that were available during my care of the patient were reviewed by me and considered in my medical decision making (see chart for details).  Clinical Course as of Sep 30 1443  Sun Sep 29, 2019  1724 Glucose-Capillary(!): 140 [JL]    Clinical Course User Index [JL] Regan Lemming, MD       The patient is a 53 year old male presenting after an accidental overdose of his home medications. On arrival, the patient was somnolent and mildly hypotensive. A 1L LR bolus was initiated. Of the patient's above listed home medications, likely  OD culprits include Gabapentin and his home Carvedilol.   0.4mg  Narcan administered on arrival without response. His CBG was normal. His pupillary exam was 60mm and mildly reactive. Neuro exam without focal findings, doubt acute CVA in setting of known accidental medication ingestion. An EKG revealed prolonged QTc, normal QRS duration, nonspecific T wave  changes. Given the patient's normal HR in the 70s, favor likely main culprit of the patient's symptoms to be Gabapentin.  Tylenol, Salicylate, EtOH levels were normal. Hepatic function, BMP, and CBC grossly normal. A Mg level was normal. Spoke to [place] poison control who recommended a 6 hour period of observation.   A CT Head was normal. A CXR revealed low lung volumes and an irregular 1.5cm opacity in the left suprahilar region. CT Chest may be needed outpatient for correlation. Troponins x2 were mildly elevated. Favor likely mild demand ischemia in the setting of hypotension. On re-evaluation, the patient's mental status had returned to baseline by the four hour mark. He denies chest pain or any other symptoms. He was observed in the ED for a total of 6 hours and remained back to his neurologic baseline. He was cleared for discharge home. Recommended close follow-up with his PCP this week.  Final Clinical Impressions(s) / ED Diagnoses   Final diagnoses:  Accidental drug overdose, initial encounter  Hypotension, unspecified hypotension type    ED Discharge Orders    None       Regan Lemming, MD 09/30/19 1449    Drenda Freeze, MD 10/01/19 [phone]

## 2019-09-30 ENCOUNTER — Other Ambulatory Visit: Payer: Self-pay | Admitting: *Deleted

## 2019-09-30 DIAGNOSIS — Z20822 Contact with and (suspected) exposure to covid-19: Secondary | ICD-10-CM

## 2019-10-02 LAB — NOVEL CORONAVIRUS, NAA: SARS-CoV-2, NAA: NOT DETECTED

## 2019-10-02 MED FILL — CARVEDILOL 12.5 MG TABLET: 12.5 | 30 days supply | Qty: 60 | Fill #3

## 2019-10-02 MED FILL — ROSUVASTATIN CALCIUM 40 MG: 40 | 30 days supply | Qty: 30 | Fill #1

## 2019-10-02 MED FILL — GABAPENTIN 400 MG CAPSULE: 400 | 30 days supply | Qty: 90 | Fill #0

## 2019-10-02 MED FILL — FUROSEMIDE 40 MG TAB: 40 | 30 days supply | Qty: 45 | Fill #1

## 2019-10-02 MED FILL — LANTUS SOLOSTAR 100 UNITS/M: 100 | 16 days supply | Qty: 15 | Fill #1

## 2019-10-02 MED FILL — SPIRONOLACTONE 25 MG TABLET: 25 | 30 days supply | Qty: 30 | Fill #1

## 2019-10-02 MED FILL — ENTRESTO 49 MG-51 MG TABLET: 49-51 | 30 days supply | Qty: 60 | Fill #1

## 2019-10-04 ENCOUNTER — Telehealth: Payer: Self-pay | Admitting: Family Medicine

## 2019-10-04 NOTE — Telephone Encounter (Signed)
New Message   Pt is wanting to know the results to his Covid test. Please f/u

## 2019-10-04 NOTE — Telephone Encounter (Signed)
Patient was called and informed of negative results

## 2019-10-28 IMAGING — CT CT CERVICAL SPINE W/O CM
5 of 8 series · 11 of 33 positions shown, 12 images · non-contrast
Comparison: 01/12/2018

CLINICAL DATA: Altered mental status. Hypoglycemia.

EXAM:
CT HEAD WITHOUT CONTRAST
CT CERVICAL SPINE WITHOUT CONTRAST
TECHNIQUE: Multidetector CT imaging of the head and cervical spine was
performed following the standard protocol without intravenous
contrast. Multiplanar CT image reconstructions of the cervical spine
were also generated.

[Series 5: head bone · axial · 0.44mm/px · z∈[-103,-47]mm · 2 of 85 slices shown]
[im 29/85  bone]
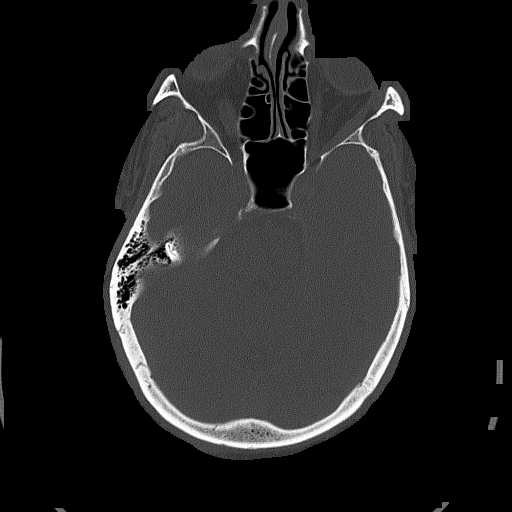
[im 57/85  bone]
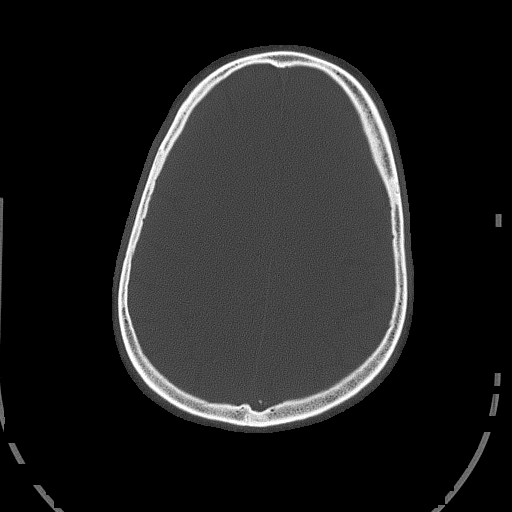

[Series 8: c_spine 2.0 st · axial · 0.24mm/px · z∈[-230,-176]mm · 2 of 83 slices shown, 3 images]
[im 28/83  soft-tissue]
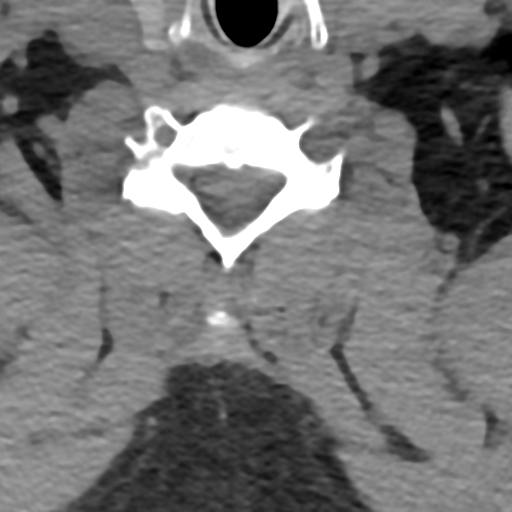
[im 28/83  bone]
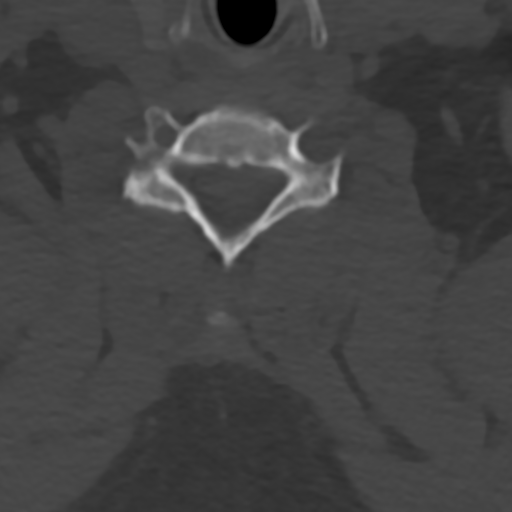
[im 55/83  bone]
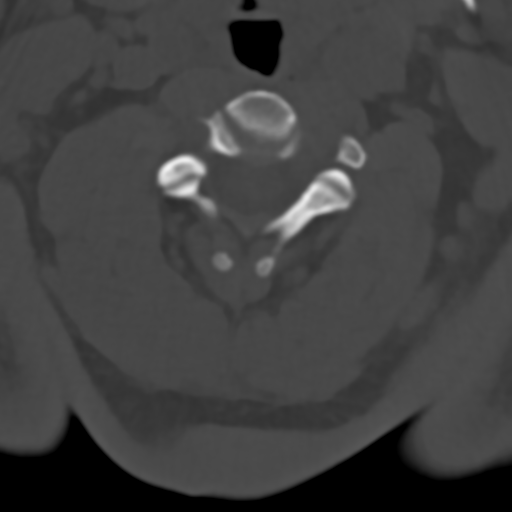

[Series 10: c_spine 2.0 sag bone · sagittal · 0.24mm/px · 4 of 53 slices shown]
[im 11/53  bone]
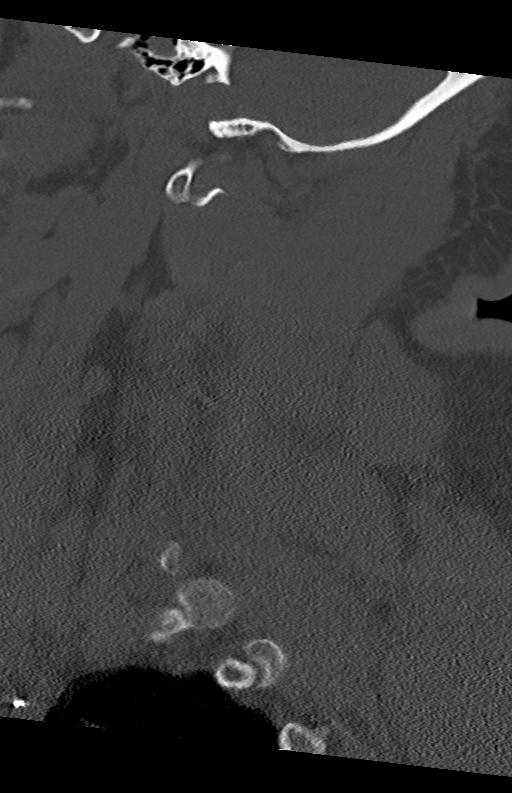
[im 21/53  bone]
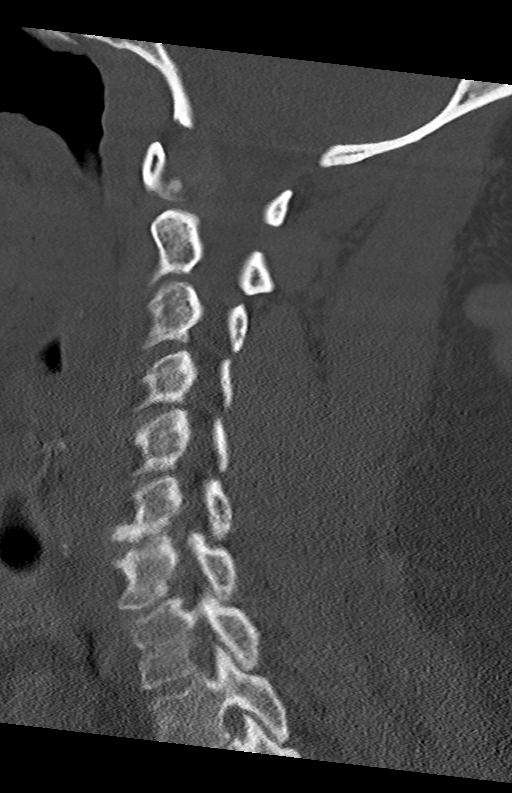
[im 32/53  bone]
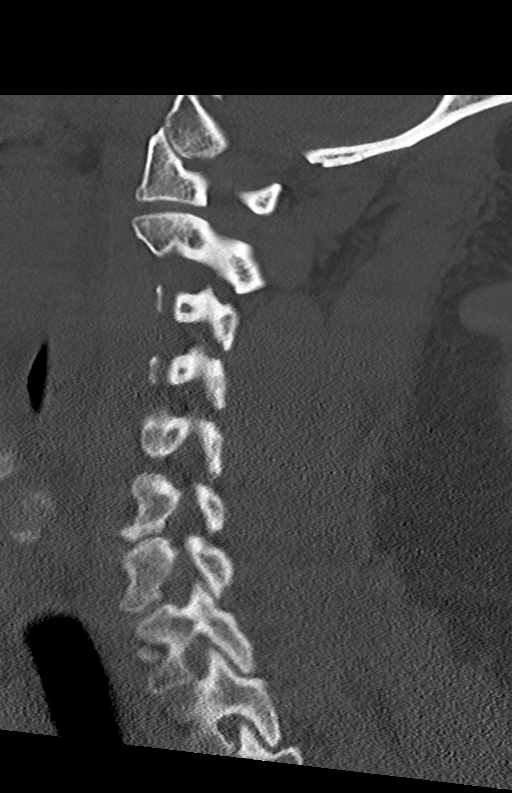
[im 42/53  bone]
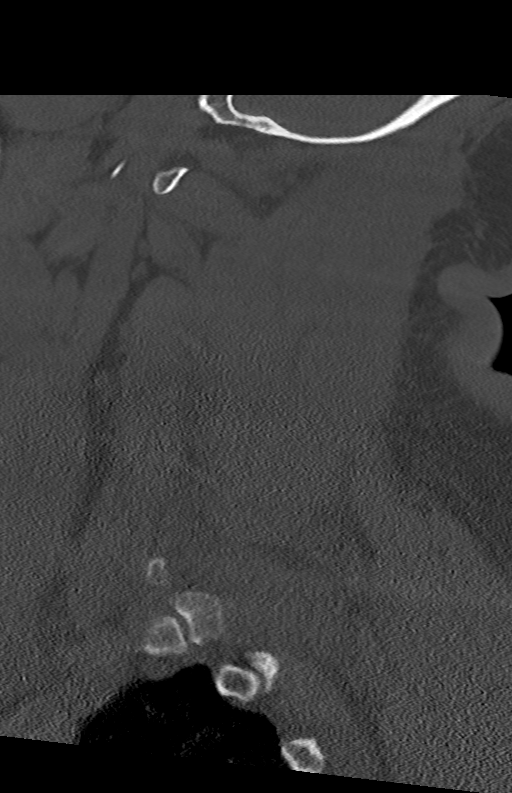

[Series 11: c_spine 2.0 cor bone · coronal · 0.19mm/px · 1 of 61 slices shown]
[im 31/61  bone]
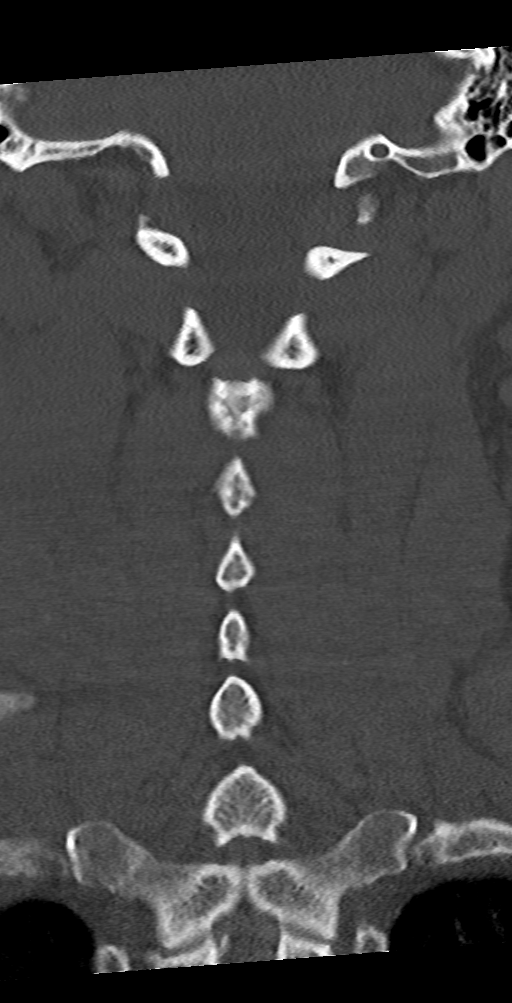

[Series 12: c_spine 2.0 orthogonals · axial · 0.21mm/px · z∈[-239,-180]mm · 2 of 84 slices shown]
[im 28/84  bone]
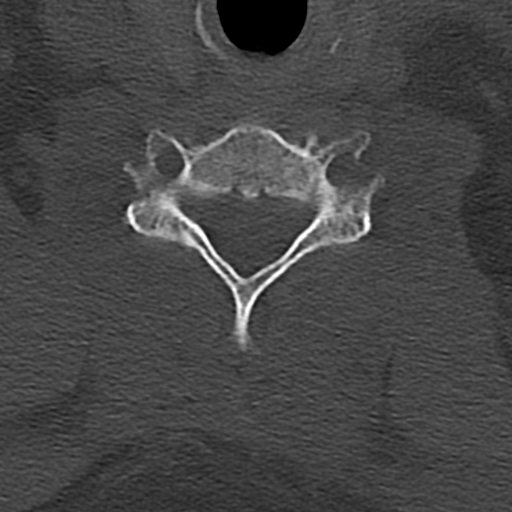
[im 56/84  bone]
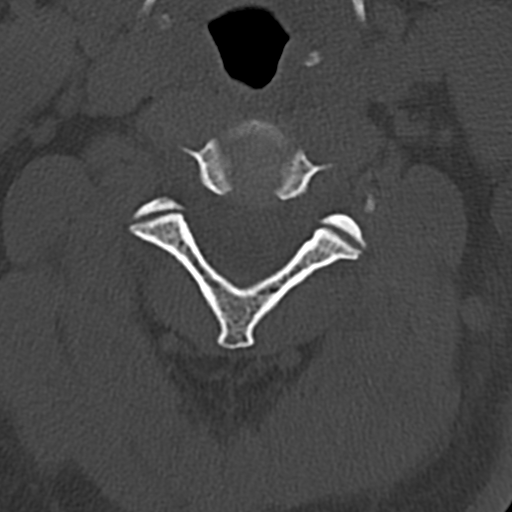

[11 of 33 positions shown; findings below may reference images not displayed]

FINDINGS: CT HEAD FINDINGS

Brain: There is no evidence of acute infarct, intracranial
hemorrhage, mass, midline shift, or extra-axial fluid collection.
The ventricles and sulci are normal. Cerebral white matter
hypodensities are nonspecific but compatible with mild chronic small
vessel ischemic disease. There is a chronic lacunar infarct in the
right lentiform nucleus.

Vascular: No hyperdense vessel.

Skull: No fracture or focal osseous lesion.

Sinuses/Orbits: Paranasal sinuses and mastoid air cells are clear.
Unremarkable orbits.

Other: None.

CT CERVICAL SPINE FINDINGS

Alignment: Mild cervical spine straightening. No listhesis.

Skull base and vertebrae: No acute fracture or suspicious osseous
lesion.

Soft tissues and spinal canal: No prevertebral fluid or swelling. No
visible canal hematoma.

Disc levels:  Mild cervical spondylosis.

Upper chest: Clear lung apices.

Other: None.
IMPRESSION: 1. No evidence of acute intracranial abnormality.
2. Mild chronic small vessel ischemic disease.
3. No evidence of acute fracture or subluxation in the cervical
spine.

## 2019-11-04 ENCOUNTER — Inpatient Hospital Stay: Payer: Medicaid Other | Admitting: Family Medicine

## 2019-11-04 MED FILL — POTASSIUM CHLORIDE ER 10 ME: 10 | 30 days supply | Qty: 30 | Fill #1

## 2019-11-06 MED FILL — LANTUS SOLOSTAR 100 UNITS/M: 100 | 16 days supply | Qty: 15 | Fill #2

## 2019-11-19 ENCOUNTER — Inpatient Hospital Stay: Payer: Medicaid Other | Admitting: Family Medicine

## 2019-11-27 ENCOUNTER — Other Ambulatory Visit: Payer: Self-pay | Admitting: Family Medicine

## 2019-11-27 DIAGNOSIS — I5022 Chronic systolic (congestive) heart failure: Secondary | ICD-10-CM

## 2019-11-27 MED FILL — ROSUVASTATIN CALCIUM 40 MG: 40 | 30 days supply | Qty: 30 | Fill #2

## 2019-11-27 MED FILL — CARVEDILOL 12.5 MG TABLET: 12.5 | 30 days supply | Qty: 60 | Fill #0

## 2019-11-27 MED FILL — ENTRESTO 49 MG-51 MG TABLET: 49-51 | 30 days supply | Qty: 60 | Fill #2

## 2019-11-27 MED FILL — FUROSEMIDE 40 MG TAB: 40 | 30 days supply | Qty: 45 | Fill #2

## 2019-11-27 MED FILL — SPIRONOLACTONE 25 MG TABLET: 25 | 30 days supply | Qty: 30 | Fill #2

## 2019-11-27 MED FILL — GABAPENTIN 400 MG CAPSULE: 400 | 30 days supply | Qty: 90 | Fill #1

## 2019-12-02 ENCOUNTER — Other Ambulatory Visit: Payer: Self-pay | Admitting: Family Medicine

## 2019-12-02 ENCOUNTER — Encounter (HOSPITAL_COMMUNITY): Payer: Self-pay | Admitting: Emergency Medicine

## 2019-12-02 ENCOUNTER — Other Ambulatory Visit: Payer: Self-pay

## 2019-12-02 ENCOUNTER — Emergency Department (HOSPITAL_COMMUNITY)
Admission: EM | Admit: 2019-12-02 | Discharge: 2019-12-03 | Disposition: A | Discharge status: Home / Self Care | Payer: Medicaid Other | Attending: Emergency Medicine | Admitting: Emergency Medicine

## 2019-12-02 DIAGNOSIS — Z95810 Presence of automatic (implantable) cardiac defibrillator: Secondary | ICD-10-CM | POA: Diagnosis not present

## 2019-12-02 DIAGNOSIS — I2510 Atherosclerotic heart disease of native coronary artery without angina pectoris: Secondary | ICD-10-CM | POA: Insufficient documentation

## 2019-12-02 DIAGNOSIS — N289 Disorder of kidney and ureter, unspecified: Secondary | ICD-10-CM | POA: Insufficient documentation

## 2019-12-02 DIAGNOSIS — I5042 Chronic combined systolic (congestive) and diastolic (congestive) heart failure: Secondary | ICD-10-CM | POA: Diagnosis not present

## 2019-12-02 DIAGNOSIS — Z7982 Long term (current) use of aspirin: Secondary | ICD-10-CM | POA: Diagnosis not present

## 2019-12-02 DIAGNOSIS — Z794 Long term (current) use of insulin: Secondary | ICD-10-CM | POA: Diagnosis not present

## 2019-12-02 DIAGNOSIS — E876 Hypokalemia: Secondary | ICD-10-CM

## 2019-12-02 DIAGNOSIS — I110 Hypertensive heart disease with heart failure: Secondary | ICD-10-CM | POA: Insufficient documentation

## 2019-12-02 DIAGNOSIS — R1031 Right lower quadrant pain: Secondary | ICD-10-CM | POA: Diagnosis not present

## 2019-12-02 DIAGNOSIS — Z79899 Other long term (current) drug therapy: Secondary | ICD-10-CM | POA: Diagnosis not present

## 2019-12-02 DIAGNOSIS — Z20822 Contact with and (suspected) exposure to covid-19: Secondary | ICD-10-CM

## 2019-12-02 DIAGNOSIS — U071 COVID-19: Secondary | ICD-10-CM | POA: Diagnosis not present

## 2019-12-02 DIAGNOSIS — D703 Neutropenia due to infection: Secondary | ICD-10-CM | POA: Diagnosis not present

## 2019-12-02 DIAGNOSIS — E119 Type 2 diabetes mellitus without complications: Secondary | ICD-10-CM | POA: Diagnosis not present

## 2019-12-02 DIAGNOSIS — R05 Cough: Secondary | ICD-10-CM | POA: Diagnosis present

## 2019-12-02 LAB — URINALYSIS, ROUTINE W REFLEX MICROSCOPIC
Bacteria, UA: NONE SEEN
Bilirubin Urine: NEGATIVE
Glucose, UA: NEGATIVE mg/dL
Ketones, ur: NEGATIVE mg/dL
Leukocytes,Ua: NEGATIVE
Nitrite: NEGATIVE
Protein, ur: >=300 mg/dL — AB
Specific Gravity, Urine: 1.023 (ref 1.005–1.030)
pH: 6 (ref 5.0–8.0)

## 2019-12-02 LAB — CBC
HCT: 38.2 % — ABNORMAL LOW (ref 39.0–52.0)
Hemoglobin: 11.7 g/dL — ABNORMAL LOW (ref 13.0–17.0)
MCH: 24.8 pg — ABNORMAL LOW (ref 26.0–34.0)
MCHC: 30.6 g/dL (ref 30.0–36.0)
MCV: 80.9 fL (ref 80.0–100.0)
Platelets: 212 10*3/uL (ref 150–400)
RBC: 4.72 MIL/uL (ref 4.22–5.81)
RDW: 15 % (ref 11.5–15.5)
WBC: 3.2 10*3/uL — ABNORMAL LOW (ref 4.0–10.5)
nRBC: 0 % (ref 0.0–0.2)

## 2019-12-02 LAB — CBG MONITORING, ED: Glucose-Capillary: 167 mg/dL — ABNORMAL HIGH (ref 70–99)

## 2019-12-02 MED FILL — LANTUS SOLOSTAR 100 UNITS/M: 100 | 16 days supply | Qty: 15 | Fill #3

## 2019-12-02 NOTE — ED Triage Notes (Signed)
Pt reports he has been unable to eat for a couple days due to decreased appetite, reports a few episodes of emesis yesterday and RLQ abdominal pain. Denies sick contacts.

## 2019-12-03 ENCOUNTER — Emergency Department (HOSPITAL_COMMUNITY): Payer: Medicaid Other

## 2019-12-03 ENCOUNTER — Telehealth: Payer: Self-pay | Admitting: *Deleted

## 2019-12-03 LAB — COMPREHENSIVE METABOLIC PANEL
ALT: 23 U/L (ref 0–44)
AST: 26 U/L (ref 15–41)
Albumin: 3.1 g/dL — ABNORMAL LOW (ref 3.5–5.0)
Alkaline Phosphatase: 69 U/L (ref 38–126)
Anion gap: 11 (ref 5–15)
BUN: 12 mg/dL (ref 6–20)
CO2: 22 mmol/L (ref 22–32)
Calcium: 8.4 mg/dL — ABNORMAL LOW (ref 8.9–10.3)
Chloride: 104 mmol/L (ref 98–111)
Creatinine, Ser: 1.47 mg/dL — ABNORMAL HIGH (ref 0.61–1.24)
GFR calc Af Amer: >60 mL/min (ref >60)
GFR calc non Af Amer: 53 mL/min — ABNORMAL LOW (ref >60)
Glucose, Bld: 158 mg/dL — ABNORMAL HIGH (ref 70–99)
Potassium: 3.1 mmol/L — ABNORMAL LOW (ref 3.5–5.1)
Sodium: 137 mmol/L (ref 135–145)
Total Bilirubin: 0.5 mg/dL (ref 0.3–1.2)
Total Protein: 6.9 g/dL (ref 6.5–8.1)

## 2019-12-03 LAB — SARS CORONAVIRUS 2 (TAT 6-24 HRS): SARS Coronavirus 2: POSITIVE — AB

## 2019-12-03 LAB — LIPASE, BLOOD: Lipase: 17 U/L (ref 11–51)

## 2019-12-03 MED ORDER — SODIUM CHLORIDE 0.9 % IV BOLUS
1000.0000 mL | Freq: Once | INTRAVENOUS | Status: AC
  Administered 2019-12-03: 04:00:00 1000 mL via INTRAVENOUS

## 2019-12-03 MED ORDER — IOHEXOL 300 MG/ML  SOLN
100.0000 mL | Freq: Once | INTRAMUSCULAR | Status: AC | PRN
  Administered 2019-12-03: 100 mL via INTRAVENOUS

## 2019-12-03 MED ORDER — ONDANSETRON HCL 4 MG/2ML IJ SOLN
4.0000 mg | Freq: Once | INTRAMUSCULAR | Status: AC
  Administered 2019-12-03: 4 mg via INTRAVENOUS
  Filled 2019-12-03: qty 2

## 2019-12-03 MED ORDER — POTASSIUM CHLORIDE CRYS ER 20 MEQ PO TBCR: 20.0000 meq | EXTENDED_RELEASE_TABLET | Freq: Every day | ORAL | 0 refills | Status: DC

## 2019-12-03 MED ORDER — ONDANSETRON HCL 4 MG PO TABS: 4.0000 mg | ORAL_TABLET | Freq: Four times a day (QID) | ORAL | 0 refills | Status: DC | PRN

## 2019-12-03 MED ORDER — POTASSIUM CHLORIDE CRYS ER 20 MEQ PO TBCR
40.0000 meq | EXTENDED_RELEASE_TABLET | Freq: Once | ORAL | Status: AC
  Administered 2019-12-03: 40 meq via ORAL
  Filled 2019-12-03: qty 2

## 2019-12-03 MED FILL — POTASSIUM CL ER 20 MEQ TAB: 20 | 5 days supply | Qty: 5 | Fill #0

## 2019-12-03 MED FILL — ONDANSETRON HCL 4 MG TABLET: 4 | 3 days supply | Qty: 12 | Fill #0

## 2019-12-03 MED FILL — TRUEPLUS PEN NDL 32GX5/32: 32G X 4 MM | 25 days supply | Qty: 100 | Fill #0

## 2019-12-03 NOTE — ED Notes (Signed)
Patient transported to CT by this RN 

## 2019-12-03 NOTE — ED Provider Notes (Signed)
[facility] EMERGENCY DEPARTMENT Provider Note   CSN: [id number] Arrival date & time: 12/02/19  2145   History Anorexia, COVID-19 exposure  Jeffrey [name] is a 53 y.o. male.  The history is provided by the patient.  He has history of hypertension, diabetes, hyperlipidemia and comes in complaining of anorexia for the last 4 days.  He states that food just does not taste right but denies actual loss of sense of taste or smell.  He has had some nausea and vomiting but no constipation or diarrhea.  He does admit to a cough which is nonproductive.  He denies nasal congestion or sore throat.  His girlfriend has been tested positive for coronavirus.  He had subjective fever yesterday but denies chills or sweats.  He denies arthralgias or myalgias.  He is complaining of right lower quadrant abdominal pain which he rates at 5/10.  Past Medical History:  Diagnosis Date  . CAD (coronary artery disease)    a. cath 01/31/17: 60% 1st RPLB, 60% dist RCA, 55% prox RCA, 10% pro LAD --> Rx TX.   Marland Kitchen Chronic systolic CHF (congestive heart failure) ([place]) 01/28/2017   1. Echo 01/29/17:  EF 20-25, normal wall motion, mild LAE // 2. EF 10-15 by [facility] 01/2017   . Diabetes mellitus   . Diabetic foot infection ([place]) 03/2016   RT FOOT  . HTN (hypertension)   . Hyperlipidemia   . NICM (nonischemic cardiomyopathy) ([place]) 02/15/2017   1. Mod non-obs CAD on LHC in 01/2017 - CAD does not explain cardiomyopathy    Patient Active Problem List   Diagnosis Date Noted  . Hypoglycemia 10/16/2018  . Acute metabolic encephalopathy [id number]  . S/P ICD (internal cardiac defibrillator) procedure 01/19/2018  . Cardiac device in situ   . Syncope 01/13/2018  . NICM (nonischemic cardiomyopathy) ([place]) 02/15/2017  . Coronary artery disease involving native coronary artery of native heart without angina pectoris 02/15/2017  . Hyperlipidemia 02/15/2017  . Chronic combined systolic (congestive) and diastolic  (congestive) heart failure ([place]) 01/28/2017  . Folliculitis [id number]  . 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 Anemia, iron deficiency   . Noncompliance with medication regimen 03/30/2016  . Uncontrolled type 2 diabetes mellitus with hyperglycemia, with long-term current use of insulin ([place])   . Cellulitis 09/25/2015  . Essential hypertension 08/21/2013  . Diabetic neuropathy ([place]) 08/21/2013  . Fungal toenail infection 08/21/2013    Past Surgical History:  Procedure Laterality Date  . AMPUTATION Right 04/01/2016   Procedure: Right Great Toe Amputation;  Surgeon: Newt Minion, MD;  Location: [place];  Service: Orthopedics;  Laterality: Right;  . AMPUTATION Right 06/19/2016   Procedure: AMPUTATION SECOND TOE;  Surgeon: Marybelle Killings, MD;  Location: [place];  Service: Orthopedics;  Laterality: Right;  . AMPUTATION TOE Right   . BACK SURGERY     for abscess  . ICD IMPLANT N/A 01/15/2018   Procedure: ICD IMPLANT;  Surgeon: Deboraha Sprang, MD;  Location: [place] [facility];  Service: Cardiovascular;  Laterality: N/A;  . RIGHT/LEFT HEART CATH AND CORONARY ANGIOGRAPHY N/A 01/31/2017   Procedure: Right/Left Heart Cath and Coronary Angiography;  Surgeon: Troy Sine, MD;  Location: [place] [facility];  Service: Cardiovascular;  Laterality: N/A;       Family History  Problem Relation Age of Onset  . Diabetes Mother   . Hypertension Mother   . Diabetes Father   . Heart attack Father   .  Diabetes Sister   . Heart attack Maternal Grandmother     Social History   Tobacco Use  . Smoking status: Never Smoker  . Smokeless tobacco: Never Used  Substance Use Topics  . Alcohol use: No  . Drug use: No    Home Medications Prior to Admission medications   Medication Sig Start Date End Date Taking? Authorizing Provider  acetaminophen (TYLENOL) 500 MG tablet Take 1,000 mg by mouth every 6 (six) hours as needed for headache (pain).    [provider]  albuterol (PROVENTIL HFA;VENTOLIN HFA) 108 (90 Base) MCG/ACT inhaler Inhale 1-2 puffs into the lungs every 6 (six) hours as needed for wheezing or shortness of breath. 11/15/16   Recardo Evangelist, PA-C  aspirin EC 81 MG EC tablet Take 1 tablet (81 mg total) by mouth daily. 01/16/18   Sheikh, Omair Latif, DO  benzonatate (TESSALON) 100 MG capsule TAKE 1 CAPSULE BY MOUTH 3 TIMES DAILY AS NEEDED FOR COUGH. 05/14/19   Charlott Rakes, MD  Blood Glucose Monitoring Suppl (TRUE METRIX METER) DEVI 1 each by Does not apply route 3 (three) times daily before meals. 02/15/17   Charlott Rakes, MD  carvedilol (COREG) 12.5 MG tablet Take 1 tablet (12.5 mg total) by mouth 2 (two) times daily with a meal. 05/16/19   Charlott Rakes, MD  cetirizine (ZYRTEC) 10 MG tablet Take 1 tablet (10 mg total) by mouth daily. 05/16/19   Charlott Rakes, MD  cyclobenzaprine (FLEXERIL) 5 MG tablet Take 1 tablet (5 mg total) by mouth at bedtime as needed for muscle spasms. 04/24/19   Charlott Rakes, MD  ENTRESTO 49-51 MG TAKE 1 TABLET BY MOUTH 2 TIMES DAILY. 07/15/19   Deboraha Sprang, MD  ferrous sulfate 325 (65 FE) MG tablet Take 1 tablet (325 mg total) by mouth 2 (two) times daily with a meal. 04/03/16   Dhungel, Nishant, MD  furosemide (LASIX) 40 MG tablet Take 1.5 tablets (60 mg total) by mouth daily. 05/16/19 08/14/19  Charlott Rakes, MD  gabapentin (NEURONTIN) 400 MG capsule TAKE 1 CAPSULE BY MOUTH 3 TAKE TIMES DAILY. 05/16/19   Charlott Rakes, MD  glucose blood (TRUE METRIX BLOOD GLUCOSE TEST) test strip Use 3 times daily before meals 02/15/17   Charlott Rakes, MD  insulin aspart (NOVOLOG) 100 UNIT/ML injection Inject 0-15 Units into the skin 3 (three) times daily with meals. Sliding scale  CBG 70 - 120: 0 units: CBG 121 - 140: 2 units; CBG 140 - 200: 4 units; CBG 201 - 240: 6 units; CBG 240 - 300: 8 units;CBG 300 - 350: 12 units; CBG 351 - 400: 16 units; CBG > 400 : 16 units and notify your  MD 12/20/17   Charlott Rakes, MD  Insulin  Glargine (LANTUS SOLOSTAR) 100 UNIT/ML Solostar Pen Inject 45 Units into the skin 2 (two) times daily. 05/16/19   Charlott Rakes, MD  Insulin Pen Needle (TRUEPLUS PEN NEEDLES) 32G X 4 MM MISC Use to inject Lantus daily. Must use new pen needle with each injection. 09/17/18   Charlott Rakes, MD  potassium chloride (K-DUR) 10 MEQ tablet TAKE 1 TABLET (10 MEQ TOTAL) BY MOUTH DAILY. 05/14/19 08/12/19  Charlott Rakes, MD  rosuvastatin (CRESTOR) 40 MG tablet Take 1 tablet (40 mg total) by mouth daily. 05/16/19   Charlott Rakes, MD  spironolactone (ALDACTONE) 25 MG tablet Take 1 tablet (25 mg total) by mouth daily. 05/16/19   Charlott Rakes, MD  traZODone (DESYREL) 100 MG tablet Take 1 tablet (100  mg total) by mouth at bedtime. For sleep 06/25/19   Charlott Rakes, MD  TRUEPLUS LANCETS 28G MISC 1 each by Does not apply route 3 (three) times daily before meals. 02/15/17   Charlott Rakes, MD    Allergies    Patient has no known allergies.  Review of Systems   Review of Systems  All other systems reviewed and are negative.   Physical Exam Updated Vital Signs BP 140/89 (BP Location: Left Arm)   Pulse 92   Temp 99.7 F (37.6 C) (Oral)   Resp 16   SpO2 97%   Physical Exam Vitals and nursing note reviewed.   53 year old male, resting comfortably and in no acute distress. Vital signs are normal. Oxygen saturation is 97%, which is normal. Head is normocephalic and atraumatic. PERRLA, EOMI. Oropharynx is clear. Neck is nontender and supple without adenopathy or JVD. Back is nontender and there is no CVA tenderness. Lungs are clear without rales, wheezes, or rhonchi. Chest is nontender. Heart has regular rate and rhythm without murmur. Abdomen is soft, flat, with mild right lower quadrant tenderness.  There is no rebound or guarding.  There are no masses or hepatosplenomegaly and peristalsis is hypoactive. Extremities have no cyanosis or edema, full range of motion is present. Skin is warm and dry  without rash. Neurologic: Mental status is normal, cranial nerves are intact, there are no motor or sensory deficits.  ED Results / Procedures / Treatments   Labs (all labs ordered are listed, but only abnormal results are displayed) Labs Reviewed  COMPREHENSIVE METABOLIC PANEL - Abnormal; Notable for the following components:      Result Value   Potassium 3.1 (*)    Glucose, Bld 158 (*)    Creatinine, Ser 1.47 (*)    Calcium 8.4 (*)    Albumin 3.1 (*)    GFR calc non Af Amer 53 (*)    All other components within normal limits  CBC - Abnormal; Notable for the following components:   WBC 3.2 (*)    Hemoglobin 11.7 (*)    HCT 38.2 (*)    MCH 24.8 (*)    All other components within normal limits  URINALYSIS, ROUTINE W REFLEX MICROSCOPIC - Abnormal; Notable for the following components:   Hgb urine dipstick MODERATE (*)    Protein, ur >=300 (*)    All other components within normal limits  CBG MONITORING, ED - Abnormal; Notable for the following components:   Glucose-Capillary 167 (*)    All other components within normal limits  SARS CORONAVIRUS 2 (TAT 6-24 HRS)  LIPASE, BLOOD    EKG EKG Interpretation  Date/Time:  Monday December 02 2019 22:47:42 EST Ventricular Rate:  99 PR Interval:  170 QRS Duration: 86 QT Interval:  368 QTC Calculation: 472 R Axis:   7 Text Interpretation: Normal sinus rhythm Possible Inferior infarct , age undetermined Abnormal ECG Nonspecific T wave abnormality When compared with ECG of 09/29/2019, No significant change was found Confirmed by Delora Fuel ([id number]) on 12/02/2019 11:01:32 PM   Radiology CT Chest Wo Contrast  Result Date: 12/03/2019 CLINICAL DATA:  Followup abnormal chest x-ray. EXAM: CT CHEST WITHOUT CONTRAST TECHNIQUE: Multidetector CT imaging of the chest was performed following the standard protocol without IV contrast. COMPARISON:  Chest x-ray 10/03/2019 FINDINGS: Cardiovascular: The heart is mildly enlarged. No pericardial  effusion. Stable single right ventricular pacer wire. The aorta is normal in caliber. No atherosclerotic calcifications. Mediastinum/Nodes: No mediastinal or hilar mass or adenopathy.  The esophagus is grossly normal. Lungs/Pleura: Patchy bilateral ground-glass infiltrates suggesting atypical pneumonia such as COVID pneumonia. No worrisome pulmonary lesions or pleural effusion. No pulmonary edema. Streaky bibasilar atelectasis. Upper Abdomen: No significant upper abdominal findings. Calcification noted in the upper left kidney. Musculoskeletal: Symmetric appearing bilateral gynecomastia. No supraclavicular or axillary adenopathy. The bony thorax is intact. IMPRESSION: 1. Patchy bilateral ground-glass infiltrates suggesting atypical/viral pneumonia such as COVID pneumonia. 2. No worrisome pulmonary lesions or pleural effusion. 3. Mild stable cardiac enlargement. Electronically Signed   By: Marijo Sanes M.D.   On: 12/03/2019 07:23   CT ABDOMEN PELVIS W CONTRAST  Result Date: 12/03/2019 CLINICAL DATA:  Right lower quadrant pain EXAM: CT ABDOMEN AND PELVIS WITH CONTRAST TECHNIQUE: Multidetector CT imaging of the abdomen and pelvis was performed using the standard protocol following bolus administration of intravenous contrast. CONTRAST:  139mL OMNIPAQUE IOHEXOL 300 MG/ML  SOLN COMPARISON:  None. FINDINGS: Lower chest:  Pacer lead into the right ventricular apex. Hepatobiliary: No focal liver abnormality.No evidence of biliary obstruction or stone. Pancreas: Unremarkable. Spleen: Unremarkable. Adrenals/Urinary Tract: Negative adrenals. No hydronephrosis or ureteral stone. Punctate left renal calculus. Unremarkable bladder. Stomach/Bowel:  No obstruction. No appendicitis. Vascular/Lymphatic: No acute vascular abnormality. Prominent bilateral inguinal lymph nodes but symmetric and overall benign appearing Reproductive:Seminal vesicle calcification correlating with diabetes history. Other: No ascites or  pneumoperitoneum.  Fatty umbilical hernia. Musculoskeletal: No acute abnormalities. Diffuse lumbar disc narrowing and endplate degeneration. IMPRESSION: 1. Negative for appendicitis or other acute finding. 2. Small left renal calculus. 3. Fatty umbilical hernia. Electronically Signed   By: Monte Fantasia M.D.   On: 12/03/2019 05:34   DG Chest Port 1 View  Result Date: 12/03/2019 CLINICAL DATA:  Cough, recent COVID exposure. EXAM: PORTABLE CHEST 1 VIEW COMPARISON:  Radiograph 09/29/2019 FINDINGS: Single lead AICD battery pack projects in the left chest wall lead directed towards the cardiac apex. Stable cardiomegaly when compared to prior exams. Persistent nodular opacity present in the left mid lung, indeterminate on radiography. Lung volumes are low with some streaky atelectatic changes. No consolidation, features of edema, pneumothorax, or effusion. No acute osseous or soft tissue abnormality. IMPRESSION: 1. Persistent nodular opacity in the left mid lung, indeterminate on radiography. CT chest recommended for further characterization. 2. Stable cardiomegaly without edema or effusion. 3. Low lung volumes with streaky atelectatic changes. Electronically Signed   By: Lovena Le M.D.   On: 12/03/2019 03:38    Procedures Procedures   Medications Ordered in ED Medications  sodium chloride 0.9 % bolus 1,000 mL (has no administration in time range)  ondansetron (ZOFRAN) injection 4 mg (has no administration in time range)    ED Course  I have reviewed the triage vital signs and the nursing notes.  Pertinent labs & imaging results that were available during my care of the patient were reviewed by me and considered in my medical decision making (see chart for details).  MDM Rules/Calculators/A&P                     Symptom complex is very likely to be COVID-19.  Labs obtained prior to my seeing the patient do show leukopenia.  There is also a slight increase in creatinine over baseline which is  likely due to dehydration.  Mild hypokalemia is present and he is given a dose of oral potassium.  Will check chest x-ray to look for evidence of pneumonia and we will also send for CT of abdomen and pelvis to look  for evidence of appendicitis.  Old records are reviewed, and he has no relevant past visits.  CT of abdomen and pelvis showed no acute process.  Chest x-ray showed a nodular density in the left midlung, CT of chest recommended for further characterization.  CT of the chest has been ordered.    CT shows no significant nodule, groundglass densities suggestive of COVID-19.  He will be sent home with prescriptions for ondansetron and K. Dur, return to the ED for worsening dyspnea.  ZACKRY ZEPHIR was evaluated in Emergency Department on 12/03/2019 for the symptoms described in the history of present illness. He was evaluated in the context of the global COVID-19 pandemic, which necessitated consideration that the patient might be at risk for infection with the SARS-CoV-2 virus that causes COVID-19. Institutional protocols and algorithms that pertain to the evaluation of patients at risk for COVID-19 are in a state of rapid change based on information released by regulatory bodies including the CDC and federal and state organizations. These policies and algorithms were followed during the patient's care in the ED.  Final Clinical Impression(s) / ED Diagnoses Final diagnoses:  None    Rx / DC Orders ED Discharge Orders    None       Delora Fuel, [place] [id number] [phone]

## 2019-12-03 NOTE — ED Notes (Signed)
Pt states that his GF just tested positive for covid

## 2019-12-03 NOTE — Discharge Instructions (Addendum)
Take acetaminophen as needed for fever.  Return if your breathing is getting worse.

## 2019-12-03 NOTE — Telephone Encounter (Signed)
Pt covid tested in ED yesterday. Results positive:    Reviewed positive covid 19 results with patient. Patient states he is afebrile. Tested due to direct exposure. Reviewed quarantine precautions; self isolate for 10 days from onset of symptoms, with 3 consecutive days fever free without fever reducing medications. Any respiratory symptoms should be resolved at that time as well. Leave home for medical issues only. Treat any symptoms with over the counter medications. Reviewed household precautions and preventive care measures, including: frequent hand-washing, wiping down of high touch areas ie: doorknobs, counter tops. Isolate, distance from rest of household. Any household members must quarantine as well for 14 days from your test date. Reviewed symptoms which warrant an ED visit. Pt verbalizes understanding.   Will alert [facility]

## 2019-12-03 NOTE — Telephone Encounter (Signed)
Pt notified of positive COVID-19 test results. Pt verbalized understanding. Pt reports that he does have a cough.Pt advised to remain in self quarantine until at least 14 days since positive exposure And 3 consecutive days fever free without antipyretics And improvement in respiratory symptoms. Patient advised to utilize over the counter medications to treat symptoms. Pt advised to seek treatment in the ED if respiratory issues/distress develops.Pt advised they should only leave home to seek and medical care and must wear a mask in public. Pt instructed to limit contact with family members or caregivers in the home. Pt advised to practice social distancing and to continue to use good preventative care measures such has frequent hand washing, staying out of crowds and cleaning hard surfaces frequently touched in the home.Pt informed that the health department will likely follow up and may have additional recommendations. [facility] Department notified.

## 2019-12-04 ENCOUNTER — Encounter (HOSPITAL_COMMUNITY): Payer: Self-pay | Admitting: Emergency Medicine

## 2019-12-04 ENCOUNTER — Emergency Department (HOSPITAL_COMMUNITY)
Admission: EM | Admit: 2019-12-04 | Discharge: 2019-12-04 | Disposition: A | Discharge status: Home / Self Care | Payer: Medicaid Other | Attending: Emergency Medicine | Admitting: Emergency Medicine

## 2019-12-04 DIAGNOSIS — Z794 Long term (current) use of insulin: Secondary | ICD-10-CM | POA: Insufficient documentation

## 2019-12-04 DIAGNOSIS — E119 Type 2 diabetes mellitus without complications: Secondary | ICD-10-CM | POA: Diagnosis not present

## 2019-12-04 DIAGNOSIS — Z7982 Long term (current) use of aspirin: Secondary | ICD-10-CM | POA: Insufficient documentation

## 2019-12-04 DIAGNOSIS — Z79899 Other long term (current) drug therapy: Secondary | ICD-10-CM | POA: Insufficient documentation

## 2019-12-04 DIAGNOSIS — I5021 Acute systolic (congestive) heart failure: Secondary | ICD-10-CM | POA: Insufficient documentation

## 2019-12-04 DIAGNOSIS — R0602 Shortness of breath: Secondary | ICD-10-CM | POA: Diagnosis present

## 2019-12-04 DIAGNOSIS — U071 COVID-19: Secondary | ICD-10-CM | POA: Diagnosis not present

## 2019-12-04 DIAGNOSIS — I2510 Atherosclerotic heart disease of native coronary artery without angina pectoris: Secondary | ICD-10-CM | POA: Insufficient documentation

## 2019-12-04 DIAGNOSIS — I110 Hypertensive heart disease with heart failure: Secondary | ICD-10-CM | POA: Diagnosis not present

## 2019-12-04 MED ORDER — ALBUTEROL SULFATE HFA 108 (90 BASE) MCG/ACT IN AERS
2.0000 | INHALATION_SPRAY | Freq: Once | RESPIRATORY_TRACT | Status: AC
  Administered 2019-12-04: 2 via RESPIRATORY_TRACT
  Filled 2019-12-04: qty 6.7

## 2019-12-04 MED ORDER — AEROCHAMBER PLUS FLO-VU LARGE MISC
1.0000 | Freq: Once | Status: AC
  Administered 2019-12-04: 1

## 2019-12-04 MED ORDER — AEROCHAMBER PLUS FLO-VU LARGE MISC
Status: AC
  Filled 2019-12-04: qty 1

## 2019-12-04 NOTE — ED Triage Notes (Signed)
Pt reports pos covid test here yesterday, having worsening sob, cough and abd pain, unsure of fevers. resp e/u, nad.

## 2019-12-04 NOTE — ED Provider Notes (Signed)
[facility] EMERGENCY DEPARTMENT Provider Note   CSN: [id number] Arrival date & time: 12/04/19  1250     History Chief Complaint  Patient presents with  . covid  . Shortness of Breath    JEHAD BAACK is a 53 y.o. male hx of CAD, CHF, DM, HTN here presenting with shortness of breath.  Patient came to the ER yesterday with exact same symptoms.  He also has abdominal pain so he had extensive work-up including a CT chest abdomen pelvis which is consistent with Covid .  His abdominal CT was unremarkable .  He had a outpatient Covid test that came back this morning and was positive .  He was very concerned about his symptoms and told me that he has shortness of breath which is unchanged since yesterday .  He was told to follow-up in the health department but instead he came here for evaluation.  He has some chills but no documented fevers.  The history is provided by the patient.       Past Medical History:  Diagnosis Date  . CAD (coronary artery disease)    a. cath 01/31/17: 60% 1st RPLB, 60% dist RCA, 55% prox RCA, 10% pro LAD --> Rx TX.   Marland Kitchen Chronic systolic CHF (congestive heart failure) ([place]) 01/28/2017   1. Echo 01/29/17:  EF 20-25, normal wall motion, mild LAE // 2. EF 10-15 by [facility] 01/2017   . Diabetes mellitus   . Diabetic foot infection ([place]) 03/2016   RT FOOT  . HTN (hypertension)   . Hyperlipidemia   . NICM (nonischemic cardiomyopathy) ([place]) 02/15/2017   1. Mod non-obs CAD on [facility] in 01/2017 - CAD does not explain cardiomyopathy    Patient Active Problem List   Diagnosis Date Noted  . Hypoglycemia 10/16/2018  . Acute metabolic encephalopathy 0000000  . S/P ICD (internal cardiac defibrillator) procedure 01/19/2018  . Cardiac device in situ   . Syncope 01/13/2018  . NICM (nonischemic cardiomyopathy) ([place]) 02/15/2017  . Coronary artery disease involving native coronary artery of native heart without angina pectoris 02/15/2017  . Hyperlipidemia  02/15/2017  . Chronic combined systolic (congestive) and diastolic (congestive) heart failure ([place]) 01/28/2017  . Folliculitis [id number]  . 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 Anemia, iron deficiency   . Noncompliance with medication regimen 03/30/2016  . Uncontrolled type 2 diabetes mellitus with hyperglycemia, with long-term current use of insulin ([place])   . Cellulitis 09/25/2015  . Essential hypertension 08/21/2013  . Diabetic neuropathy ([place]) 08/21/2013  . Fungal toenail infection 08/21/2013    Past Surgical History:  Procedure Laterality Date  . AMPUTATION Right 04/01/2016   Procedure: Right Great Toe Amputation;  Surgeon: Newt Minion, MD;  Location: [facility];  Service: Orthopedics;  Laterality: Right;  . AMPUTATION Right 06/19/2016   Procedure: AMPUTATION SECOND TOE;  Surgeon: Marybelle Killings, MD;  Location: [facility];  Service: Orthopedics;  Laterality: Right;  . AMPUTATION TOE Right   . BACK SURGERY     for abscess  . ICD IMPLANT N/A 01/15/2018   Procedure: ICD IMPLANT;  Surgeon: Deboraha Sprang, MD;  Location: [facility];  Service: Cardiovascular;  Laterality: N/A;  . RIGHT/LEFT HEART CATH AND CORONARY ANGIOGRAPHY N/A 01/31/2017   Procedure: Right/Left Heart Cath and Coronary Angiography;  Surgeon: Troy Sine, MD;  Location: [facility];  Service: Cardiovascular;  Laterality: N/A;       Family History  Problem Relation Age of Onset  . Diabetes Mother   . Hypertension Mother   . Diabetes Father   . Heart attack Father   . Diabetes Sister   . Heart attack Maternal Grandmother     Social History   Tobacco Use  . Smoking status: Never Smoker  . Smokeless tobacco: Never Used  Substance Use Topics  . Alcohol use: No  . Drug use: No    Home Medications Prior to Admission medications   Medication Sig Start Date End Date Taking? Authorizing Provider  TRUEPLUS PEN NEEDLES 32G X 4 MM MISC USE TO INJECT LANTUS DAILY. MUST  USE NEW PEN NEEDLE WITH EACH INJECTION. 12/03/19   Charlott Rakes, MD  acetaminophen (TYLENOL) 500 MG tablet Take 1,000 mg by mouth every 6 (six) hours as needed for headache (pain).    [provider]  albuterol (PROVENTIL HFA;VENTOLIN HFA) 108 (90 Base) MCG/ACT inhaler Inhale 1-2 puffs into the lungs every 6 (six) hours as needed for wheezing or shortness of breath. 11/15/16   Recardo Evangelist, PA-C  aspirin EC 81 MG EC tablet Take 1 tablet (81 mg total) by mouth daily. 01/16/18   Sheikh, Omair Latif, DO  benzonatate (TESSALON) 100 MG capsule TAKE 1 CAPSULE BY MOUTH 3 TIMES DAILY AS NEEDED FOR COUGH. 05/14/19   Charlott Rakes, MD  Blood Glucose Monitoring Suppl (TRUE METRIX METER) DEVI 1 each by Does not apply route 3 (three) times daily before meals. 02/15/17   Charlott Rakes, MD  carvedilol (COREG) 12.5 MG tablet Take 1 tablet (12.5 mg total) by mouth 2 (two) times daily with a meal. 05/16/19   Charlott Rakes, MD  cetirizine (ZYRTEC) 10 MG tablet Take 1 tablet (10 mg total) by mouth daily. 05/16/19   Charlott Rakes, MD  cyclobenzaprine (FLEXERIL) 5 MG tablet Take 1 tablet (5 mg total) by mouth at bedtime as needed for muscle spasms. 04/24/19   Charlott Rakes, MD  ENTRESTO 49-51 MG TAKE 1 TABLET BY MOUTH 2 TIMES DAILY. 07/15/19   Deboraha Sprang, MD  ferrous sulfate 325 (65 FE) MG tablet Take 1 tablet (325 mg total) by mouth 2 (two) times daily with a meal. 04/03/16   Dhungel, Nishant, MD  furosemide (LASIX) 40 MG tablet Take 1.5 tablets (60 mg total) by mouth daily. 05/16/19 08/14/19  Charlott Rakes, MD  gabapentin (NEURONTIN) 400 MG capsule TAKE 1 CAPSULE BY MOUTH 3 TAKE TIMES DAILY. 05/16/19   Charlott Rakes, MD  glucose blood (TRUE METRIX BLOOD GLUCOSE TEST) test strip Use 3 times daily before meals 02/15/17   Charlott Rakes, MD  insulin aspart (NOVOLOG) 100 UNIT/ML injection Inject 0-15 Units into the skin 3 (three) times daily with meals. Sliding scale  CBG 70 - 120: 0 units: CBG 121 - 140: 2  units; CBG 140 - 200: 4 units; CBG 201 - 240: 6 units; CBG 240 - 300: 8 units;CBG 300 - 350: 12 units; CBG 351 - 400: 16 units; CBG > 400 : 16 units and notify your  MD 12/20/17   Charlott Rakes, MD  Insulin Glargine (LANTUS SOLOSTAR) 100 UNIT/ML Solostar Pen Inject 45 Units into the skin 2 (two) times daily. 05/16/19   Charlott Rakes, MD  ondansetron (ZOFRAN) 4 MG tablet Take 1 tablet (4 mg total) by mouth every 6 (six) hours as needed for nausea or vomiting. 123456   Delora Fuel, MD  potassium chloride (K-DUR) 10 MEQ tablet TAKE 1 TABLET (10 MEQ TOTAL) BY MOUTH DAILY. 05/14/19 08/12/19  Charlott Rakes, MD  potassium chloride SA (KLOR-CON) 20 MEQ tablet Take 1 tablet (20 mEq total) by mouth daily. 123456   Delora Fuel, MD  rosuvastatin (CRESTOR) 40 MG tablet Take 1 tablet (40 mg total) by mouth daily. 05/16/19   Charlott Rakes, MD  spironolactone (ALDACTONE) 25 MG tablet Take 1 tablet (25 mg total) by mouth daily. 05/16/19   Charlott Rakes, MD  traZODone (DESYREL) 100 MG tablet Take 1 tablet (100 mg total) by mouth at bedtime. For sleep 06/25/19   Charlott Rakes, MD  TRUEPLUS LANCETS 28G MISC 1 each by Does not apply route 3 (three) times daily before meals. 02/15/17   Charlott Rakes, MD    Allergies    Patient has no known allergies.  Review of Systems   Review of Systems  Respiratory: Positive for shortness of breath.   All other systems reviewed and are negative.   Physical Exam Updated Vital Signs BP (!) 177/97 (BP Location: Right Arm)   Pulse 80   Temp 98.9 F (37.2 C) (Oral)   Resp 18   SpO2 98%   Physical Exam Vitals and nursing note reviewed.  HENT:     Head: Normocephalic.  Eyes:     Pupils: Pupils are equal, round, and reactive to light.  Cardiovascular:     Rate and Rhythm: Normal rate and regular rhythm.  Pulmonary:     Effort: Pulmonary effort is normal.     Breath sounds: Normal breath sounds.  Abdominal:     General: Bowel sounds are normal.     Palpations:  Abdomen is soft.  Musculoskeletal:        General: Normal range of motion.     Cervical back: Normal range of motion.  Skin:    General: Skin is warm.     Capillary Refill: Capillary refill takes less than 2 seconds.  Neurological:     General: No focal deficit present.     Mental Status: He is alert and oriented to person, place, and time.  Psychiatric:        Mood and Affect: Mood normal.        Behavior: Behavior normal.     ED Results / Procedures / Treatments   Labs (all labs ordered are listed, but only abnormal results are displayed) Labs Reviewed - No data to display  EKG EKG Interpretation  Date/Time:  Wednesday December 04 2019 13:05:00 EST Ventricular Rate:  79 PR Interval:  180 QRS Duration: 82 QT Interval:  416 QTC Calculation: 477 R Axis:   -4 Text Interpretation: Normal sinus rhythm Nonspecific T wave abnormality Prolonged QT Abnormal ECG No significant change since last tracing Confirmed by Wandra Arthurs [phone]) on 12/04/2019 1:11:30 PM   Radiology CT Chest Wo Contrast  Result Date: 12/03/2019 CLINICAL DATA:  Followup abnormal chest x-ray. EXAM: CT CHEST WITHOUT CONTRAST TECHNIQUE: Multidetector CT imaging of the chest was performed following the standard protocol without IV contrast. COMPARISON:  Chest x-ray 10/03/2019 FINDINGS: Cardiovascular: The heart is mildly enlarged. No pericardial effusion. Stable single right ventricular pacer wire. The aorta is normal in caliber. No atherosclerotic calcifications. Mediastinum/Nodes: No mediastinal or hilar mass or adenopathy. The esophagus is grossly normal. Lungs/Pleura: Patchy bilateral ground-glass infiltrates suggesting atypical pneumonia such as COVID pneumonia. No worrisome pulmonary lesions or pleural effusion. No pulmonary edema. Streaky bibasilar atelectasis. Upper Abdomen: No significant upper abdominal findings. Calcification noted in the upper left kidney. Musculoskeletal: Symmetric appearing bilateral  gynecomastia. No supraclavicular or axillary adenopathy. The bony  thorax is intact. IMPRESSION: 1. Patchy bilateral ground-glass infiltrates suggesting atypical/viral pneumonia such as COVID pneumonia. 2. No worrisome pulmonary lesions or pleural effusion. 3. Mild stable cardiac enlargement. Electronically Signed   By: Marijo Sanes M.D.   On: 12/03/2019 07:23   CT ABDOMEN PELVIS W CONTRAST  Result Date: 12/03/2019 CLINICAL DATA:  Right lower quadrant pain EXAM: CT ABDOMEN AND PELVIS WITH CONTRAST TECHNIQUE: Multidetector CT imaging of the abdomen and pelvis was performed using the standard protocol following bolus administration of intravenous contrast. CONTRAST:  142mL OMNIPAQUE IOHEXOL 300 MG/ML  SOLN COMPARISON:  None. FINDINGS: Lower chest:  Pacer lead into the right ventricular apex. Hepatobiliary: No focal liver abnormality.No evidence of biliary obstruction or stone. Pancreas: Unremarkable. Spleen: Unremarkable. Adrenals/Urinary Tract: Negative adrenals. No hydronephrosis or ureteral stone. Punctate left renal calculus. Unremarkable bladder. Stomach/Bowel:  No obstruction. No appendicitis. Vascular/Lymphatic: No acute vascular abnormality. Prominent bilateral inguinal lymph nodes but symmetric and overall benign appearing Reproductive:Seminal vesicle calcification correlating with diabetes history. Other: No ascites or pneumoperitoneum.  Fatty umbilical hernia. Musculoskeletal: No acute abnormalities. Diffuse lumbar disc narrowing and endplate degeneration. IMPRESSION: 1. Negative for appendicitis or other acute finding. 2. Small left renal calculus. 3. Fatty umbilical hernia. Electronically Signed   By: Monte Fantasia M.D.   On: 12/03/2019 05:34   DG Chest Port 1 View  Result Date: 12/03/2019 CLINICAL DATA:  Cough, recent COVID exposure. EXAM: PORTABLE CHEST 1 VIEW COMPARISON:  Radiograph 09/29/2019 FINDINGS: Single lead AICD battery pack projects in the left chest wall lead directed towards the  cardiac apex. Stable cardiomegaly when compared to prior exams. Persistent nodular opacity present in the left mid lung, indeterminate on radiography. Lung volumes are low with some streaky atelectatic changes. No consolidation, features of edema, pneumothorax, or effusion. No acute osseous or soft tissue abnormality. IMPRESSION: 1. Persistent nodular opacity in the left mid lung, indeterminate on radiography. CT chest recommended for further characterization. 2. Stable cardiomegaly without edema or effusion. 3. Low lung volumes with streaky atelectatic changes. Electronically Signed   By: Lovena Le M.D.   On: 12/03/2019 03:38    Procedures Procedures (including critical care time)  Medications Ordered in ED Medications  albuterol (VENTOLIN HFA) 108 (90 Base) MCG/ACT inhaler 2 puff (has no administration in time range)    ED Course  I have reviewed the triage vital signs and the nursing notes.  Pertinent labs & imaging results that were available during my care of the patient were reviewed by me and considered in my medical decision making (see chart for details).    MDM Rules/Calculators/A&P                      JOVE HANNINEN is a 53 y.o. male who presented with shortness of breath.  Patient was diagnosed with Covid yesterday.  He also had a CT chest abdomen pelvis that was unremarkable.  His oxygen saturation is normal currently .  I told him that his symptoms are from Covid and his work-up yesterday was appropriate.  I gave him strict return precautions and told him to quarantine at home as per guidelines.  DAEMION SWIMMER was evaluated in Emergency Department on 12/04/2019 for the symptoms described in the history of present illness. He was evaluated in the context of the global COVID-19 pandemic, which necessitated consideration that the patient might be at risk for infection with the SARS-CoV-2 virus that causes COVID-19. Institutional protocols and algorithms that pertain to the  evaluation  of patients at risk for COVID-19 are in a state of rapid change based on information released by regulatory bodies including the CDC and federal and state organizations. These policies and algorithms were followed during the patient's care in the ED.   Final Clinical Impression(s) / ED Diagnoses  Final diagnoses:  None    Rx / DC Orders ED Discharge Orders    None       Drenda Freeze, MD 12/04/19 1326

## 2019-12-04 NOTE — ED Notes (Signed)
Pt given discharge papers, all questions answered at discharge.

## 2019-12-04 NOTE — ED Notes (Signed)
Barbara (Fiancee#[phone]).

## 2019-12-04 NOTE — Discharge Instructions (Signed)
You have COVID 19 and that is why you are short of breath.   Use albuterol every 4 hrs as needed   Take tylenol for fever or chills. You are expected to have fever, shortness of breath   Get a pulse Ox, return if O2 less than 90%   See your doctor  Quarantine for 10 days as per guidelines   Return to ER if you have worse shortness of breath, O2 less than 90%, fever for a week      Person Under Monitoring Name: Jeffrey [name]  Location: [address] [place] [address]   Infection Prevention Recommendations for Individuals Confirmed to have, or Being Evaluated for, 2019 Novel Coronavirus (COVID-19) Infection Who Receive Care at Home  Individuals who are confirmed to have, or are being evaluated for, COVID-19 should follow the prevention steps below until a healthcare provider or local or state health department says they can return to normal activities.  Stay home except to get medical care You should restrict activities outside your home, except for getting medical care. Do not go to work, school, or public areas, and do not use public transportation or taxis.  Call ahead before visiting your doctor Before your medical appointment, call the healthcare provider and tell them that you have, or are being evaluated for, COVID-19 infection. This will help the healthcare provider's office take steps to keep other people from getting infected. Ask your healthcare provider to call the local or state health department.  Monitor your symptoms Seek prompt medical attention if your illness is worsening (e.g., difficulty breathing). Before going to your medical appointment, call the healthcare provider and tell them that you have, or are being evaluated for, COVID-19 infection. Ask your healthcare provider to call the local or state health department.  Wear a facemask You should wear a facemask that covers your nose and mouth when you are in the same room with other people  and when you visit a healthcare provider. People who live with or visit you should also wear a facemask while they are in the same room with you.  Separate yourself from other people in your home As much as possible, you should stay in a different room from other people in your home. Also, you should use a separate bathroom, if available.  Avoid sharing household items You should not share dishes, drinking glasses, cups, eating utensils, towels, bedding, or other items with other people in your home. After using these items, you should wash them thoroughly with soap and water.  Cover your coughs and sneezes Cover your mouth and nose with a tissue when you cough or sneeze, or you can cough or sneeze into your sleeve. Throw used tissues in a lined trash can, and immediately wash your hands with soap and water for at least 20 seconds or use an alcohol-based hand rub.  Wash your [facility] your hands often and thoroughly with soap and water for at least 20 seconds. You can use an alcohol-based hand sanitizer if soap and water are not available and if your hands are not visibly dirty. Avoid touching your eyes, nose, and mouth with unwashed hands.   Prevention Steps for Caregivers and Household Members of Individuals Confirmed to have, or Being Evaluated for, COVID-19 Infection Being Cared for in the Home  If you live with, or provide care at home for, a person confirmed to have, or being evaluated for, COVID-19 infection please follow these guidelines to prevent infection:  Follow  healthcare provider's instructions Make sure that you understand and can help the patient follow any healthcare provider instructions for all care.  Provide for the patient's basic needs You should help the patient with basic needs in the home and provide support for getting groceries, prescriptions, and other personal needs.  Monitor the patient's symptoms If they are getting sicker, call his or her medical  provider and tell them that the patient has, or is being evaluated for, COVID-19 infection. This will help the healthcare provider's office take steps to keep other people from getting infected. Ask the healthcare provider to call the local or state health department.  Limit the number of people who have contact with the patient If possible, have only one caregiver for the patient. Other household members should stay in another home or place of residence. If this is not possible, they should stay in another room, or be separated from the patient as much as possible. Use a separate bathroom, if available. Restrict visitors who do not have an essential need to be in the home.  Keep older adults, very young children, and other sick people away from the patient Keep older adults, very young children, and those who have compromised immune systems or chronic health conditions away from the patient. This includes people with chronic heart, lung, or kidney conditions, diabetes, and cancer.  Ensure good ventilation Make sure that shared spaces in the home have good air flow, such as from an air conditioner or an opened window, weather permitting.  Wash your hands often Wash your hands often and thoroughly with soap and water for at least 20 seconds. You can use an alcohol based hand sanitizer if soap and water are not available and if your hands are not visibly dirty. Avoid touching your eyes, nose, and mouth with unwashed hands. Use disposable paper towels to dry your hands. If not available, use dedicated cloth towels and replace them when they become wet.  Wear a facemask and gloves Wear a disposable facemask at all times in the room and gloves when you touch or have contact with the patient's blood, body fluids, and/or secretions or excretions, such as sweat, saliva, sputum, nasal mucus, vomit, urine, or feces.  Ensure the mask fits over your nose and mouth tightly, and do not touch it during  use. Throw out disposable facemasks and gloves after using them. Do not reuse. Wash your hands immediately after removing your facemask and gloves. If your personal clothing becomes contaminated, carefully remove clothing and launder. Wash your hands after handling contaminated clothing. Place all used disposable facemasks, gloves, and other waste in a lined container before disposing them with other household waste. Remove gloves and wash your hands immediately after handling these items.  Do not share dishes, glasses, or other household items with the patient Avoid sharing household items. You should not share dishes, drinking glasses, cups, eating utensils, towels, bedding, or other items with a patient who is confirmed to have, or being evaluated for, COVID-19 infection. After the person uses these items, you should wash them thoroughly with soap and water.  Wash laundry thoroughly Immediately remove and wash clothes or bedding that have blood, body fluids, and/or secretions or excretions, such as sweat, saliva, sputum, nasal mucus, vomit, urine, or feces, on them. Wear gloves when handling laundry from the patient. Read and follow directions on labels of laundry or clothing items and detergent. In general, wash and dry with the warmest temperatures recommended on the label.  Clean  all areas the individual has used often Clean all touchable surfaces, such as counters, tabletops, doorknobs, bathroom fixtures, toilets, phones, keyboards, tablets, and bedside tables, every day. Also, clean any surfaces that may have blood, body fluids, and/or secretions or excretions on them. Wear gloves when cleaning surfaces the patient has come in contact with. Use a diluted bleach solution (e.g., dilute bleach with 1 part bleach and 10 parts water) or a household disinfectant with a label that says EPA-registered for coronaviruses. To make a bleach solution at home, add 1 tablespoon of bleach to 1 quart (4  cups) of water. For a larger supply, add  cup of bleach to 1 gallon (16 cups) of water. Read labels of cleaning products and follow recommendations provided on product labels. Labels contain instructions for safe and effective use of the cleaning product including precautions you should take when applying the product, such as wearing gloves or eye protection and making sure you have good ventilation during use of the product. Remove gloves and wash hands immediately after cleaning.  Monitor yourself for signs and symptoms of illness Caregivers and household members are considered close contacts, should monitor their health, and will be asked to limit movement outside of the home to the extent possible. Follow the monitoring steps for close contacts listed on the symptom monitoring form.   ? If you have additional questions, contact your local health department or call the epidemiologist on call at [phone] (available 24/7). ? This guidance is subject to change. For the most up-to-date guidance from [facility], please refer to their website: YouBlogs.pl

## 2020-01-01 ENCOUNTER — Ambulatory Visit (INDEPENDENT_AMBULATORY_CARE_PROVIDER_SITE_OTHER): Payer: Medicaid Other | Admitting: *Deleted

## 2020-01-01 DIAGNOSIS — I428 Other cardiomyopathies: Secondary | ICD-10-CM

## 2020-01-01 LAB — CUP PACEART REMOTE DEVICE CHECK
Battery Remaining Longevity: 118 mo
Battery Voltage: 3.02 V
Brady Statistic RV Percent Paced: 0.01 %
Date Time Interrogation Session: 20210120001704
HighPow Impedance: 51 Ohm
Implantable Lead Implant Date: 20190204
Implantable Lead Location: 753860
Implantable Pulse Generator Implant Date: 20190204
Lead Channel Impedance Value: 323 Ohm
Lead Channel Impedance Value: 380 Ohm
Lead Channel Pacing Threshold Amplitude: 1.125 V
Lead Channel Pacing Threshold Pulse Width: 0.4 ms
Lead Channel Sensing Intrinsic Amplitude: 10.5 mV
Lead Channel Sensing Intrinsic Amplitude: 10.5 mV
Lead Channel Setting Pacing Amplitude: 2.5 V
Lead Channel Setting Pacing Pulse Width: 0.4 ms
Lead Channel Setting Sensing Sensitivity: 0.3 mV

## 2020-01-07 ENCOUNTER — Other Ambulatory Visit: Payer: Self-pay

## 2020-01-07 ENCOUNTER — Encounter: Payer: Self-pay | Admitting: Family Medicine

## 2020-01-07 ENCOUNTER — Ambulatory Visit: Payer: Medicaid Other | Attending: Family Medicine | Admitting: Family Medicine

## 2020-01-07 DIAGNOSIS — E1165 Type 2 diabetes mellitus with hyperglycemia: Secondary | ICD-10-CM

## 2020-01-07 DIAGNOSIS — R05 Cough: Secondary | ICD-10-CM

## 2020-01-07 DIAGNOSIS — I110 Hypertensive heart disease with heart failure: Secondary | ICD-10-CM | POA: Diagnosis not present

## 2020-01-07 DIAGNOSIS — Z794 Long term (current) use of insulin: Secondary | ICD-10-CM

## 2020-01-07 DIAGNOSIS — R059 Cough, unspecified: Secondary | ICD-10-CM

## 2020-01-07 DIAGNOSIS — E1169 Type 2 diabetes mellitus with other specified complication: Secondary | ICD-10-CM

## 2020-01-07 DIAGNOSIS — E1142 Type 2 diabetes mellitus with diabetic polyneuropathy: Secondary | ICD-10-CM | POA: Diagnosis not present

## 2020-01-07 DIAGNOSIS — Z1211 Encounter for screening for malignant neoplasm of colon: Secondary | ICD-10-CM

## 2020-01-07 DIAGNOSIS — E785 Hyperlipidemia, unspecified: Secondary | ICD-10-CM

## 2020-01-07 DIAGNOSIS — I5022 Chronic systolic (congestive) heart failure: Secondary | ICD-10-CM

## 2020-01-07 MED ORDER — SPIRONOLACTONE 25 MG PO TABS: 25.0000 mg | ORAL_TABLET | Freq: Every day | ORAL | 3 refills | Status: DC

## 2020-01-07 MED ORDER — CARVEDILOL 12.5 MG PO TABS: 12.5000 mg | ORAL_TABLET | Freq: Two times a day (BID) | ORAL | 3 refills | Status: DC

## 2020-01-07 MED ORDER — POTASSIUM CHLORIDE ER 10 MEQ PO TBCR: 10.0000 meq | EXTENDED_RELEASE_TABLET | Freq: Every day | ORAL | 3 refills | Status: DC

## 2020-01-07 MED ORDER — ROSUVASTATIN CALCIUM 40 MG PO TABS: 40.0000 mg | ORAL_TABLET | Freq: Every day | ORAL | 3 refills | Status: DC

## 2020-01-07 MED ORDER — INSULIN ASPART 100 UNIT/ML ~~LOC~~ SOLN: 0.0000 [IU] | Freq: Three times a day (TID) | SUBCUTANEOUS | 3 refills | Status: DC

## 2020-01-07 MED ORDER — FUROSEMIDE 40 MG PO TABS: 60.0000 mg | ORAL_TABLET | Freq: Every day | ORAL | 3 refills | Status: DC

## 2020-01-07 MED ORDER — GABAPENTIN 400 MG PO CAPS: ORAL_CAPSULE | ORAL | 3 refills | Status: DC

## 2020-01-07 MED ORDER — CETIRIZINE HCL 10 MG PO TABS: 10.0000 mg | ORAL_TABLET | Freq: Every day | ORAL | 1 refills | Status: DC

## 2020-01-07 MED ORDER — LANTUS SOLOSTAR 100 UNIT/ML ~~LOC~~ SOPN: 45.0000 [IU] | PEN_INJECTOR | Freq: Two times a day (BID) | SUBCUTANEOUS | 3 refills | Status: DC

## 2020-01-07 MED FILL — LANTUS SOLOSTAR 100 UNITS/M: 100 | 32 days supply | Qty: 30 | Fill #0

## 2020-01-07 MED FILL — POTASSIUM CHLORIDE ER 10 ME: 10 | 90 days supply | Qty: 90 | Fill #0

## 2020-01-07 MED FILL — CARVEDILOL 12.5 MG TABLET: 12.5 | 90 days supply | Qty: 180 | Fill #0

## 2020-01-07 MED FILL — FUROSEMIDE 40 MG TAB: 40 | 90 days supply | Qty: 135 | Fill #0

## 2020-01-07 MED FILL — GABAPENTIN 400 MG CAPSULE: 400 | 30 days supply | Qty: 90 | Fill #0

## 2020-01-07 MED FILL — ROSUVASTATIN CALCIUM 40 MG: 40 | 90 days supply | Qty: 90 | Fill #0

## 2020-01-07 MED FILL — CETIRIZINE HCL 10 MG TABS: 10 | 30 days supply | Qty: 30 | Fill #0

## 2020-01-07 MED FILL — SPIRONOLACTONE 25 MG TABLET: 25 | 90 days supply | Qty: 90 | Fill #0

## 2020-01-07 NOTE — Progress Notes (Signed)
Patient has been called and DOB has been verified. Patient has been screened and transferred to PCP to start phone visit.   Refill on lantus.

## 2020-01-07 NOTE — Progress Notes (Signed)
Virtual Visit via Telephone Note  I connected with Jeffrey [name], on 01/07/2020 at 9:12 AM by telephone due to the COVID-19 pandemic and verified that I am speaking with the correct person using two identifiers.   Consent: I discussed the limitations, risks, security and privacy concerns of performing an evaluation and management service by telephone and the availability of in person appointments. I also discussed with the patient that there may be a patient responsible charge related to this service. The patient expressed understanding and agreed to proceed.   Location of Patient: Home  Location of Provider: Clinic   Persons participating in Telemedicine visit: Elmar Schiek [place]-CMA Dr. Margarita Rana     History of Present Illness: Jeffrey [name] is a 54 year old male with a history of type 2 diabetes mellitus (A1c 9.9), diabetic neuropathy, status post right great and second toe amputation , hypertension, Nonischemic cardiomyopathy, CHF (EF 20 % from 2-D echo 01/2018 status post ICD placement in 01/2018) who is seen today for a telehealth visit.   He tested positive for COVID-19 on 12/03/2019 at an outpatient setting and presented to the ED for evaluation and did not meet criteria for admission. He had dyspnea and chills at the time but states now  he is back to his baseline. He does have some dyspnea on mild exertion from his CHF, he has no chest pain, has right ankle edema, has 2 pillow orthopnea, cough with phlegm in his throat ; last visit with Cardiology was in 06/2019.  With regards to Diabetes his blood sugars have been low - 60 on some occassions and he has had to take a snack but it averages around 116. His friend [name] assists with  Medication administration. He has no acute concerns today.  Past Medical History:  Diagnosis Date  . CAD (coronary artery disease)    a. cath 01/31/17: 60% 1st RPLB, 60% dist RCA, 55% prox RCA, 10% pro LAD --> Rx TX.   Marland Kitchen  Chronic systolic CHF (congestive heart failure) ([place]) 01/28/2017   1. Echo 01/29/17:  EF 20-25, normal wall motion, mild LAE // 2. EF 10-15 by [facility] 01/2017   . Diabetes mellitus   . Diabetic foot infection ([place]) 03/2016   RT FOOT  . HTN (hypertension)   . Hyperlipidemia   . NICM (nonischemic cardiomyopathy) ([place]) 02/15/2017   1. Mod non-obs CAD on LHC in 01/2017 - CAD does not explain cardiomyopathy   No Known Allergies  Current Outpatient Medications on File Prior to Visit  Medication Sig Dispense Refill  . acetaminophen (TYLENOL) 500 MG tablet Take 1,000 mg by mouth every 6 (six) hours as needed for headache (pain).    Marland Kitchen albuterol (PROVENTIL HFA;VENTOLIN HFA) 108 (90 Base) MCG/ACT inhaler Inhale 1-2 puffs into the lungs every 6 (six) hours as needed for wheezing or shortness of breath. 1 Inhaler 0  . aspirin EC 81 MG EC tablet Take 1 tablet (81 mg total) by mouth daily. 30 tablet 0  . benzonatate (TESSALON) 100 MG capsule TAKE 1 CAPSULE BY MOUTH 3 TIMES DAILY AS NEEDED FOR COUGH. 30 capsule 0  . Blood Glucose Monitoring Suppl (TRUE METRIX METER) DEVI 1 each by Does not apply route 3 (three) times daily before meals. 1 Device 0  . carvedilol (COREG) 12.5 MG tablet Take 1 tablet (12.5 mg total) by mouth 2 (two) times daily with a meal. 60 tablet 3  . cetirizine (ZYRTEC) 10 MG tablet Take 1 tablet (10 mg total) by mouth  daily. 30 tablet 1  . cyclobenzaprine (FLEXERIL) 5 MG tablet Take 1 tablet (5 mg total) by mouth at bedtime as needed for muscle spasms. 30 tablet 0  . ENTRESTO 49-51 MG TAKE 1 TABLET BY MOUTH 2 TIMES DAILY. 180 tablet 3  . ferrous sulfate 325 (65 FE) MG tablet Take 1 tablet (325 mg total) by mouth 2 (two) times daily with a meal. 60 tablet 3  . gabapentin (NEURONTIN) 400 MG capsule TAKE 1 CAPSULE BY MOUTH 3 TAKE TIMES DAILY. 90 capsule 3  . glucose blood (TRUE METRIX BLOOD GLUCOSE TEST) test strip Use 3 times daily before meals 100 each 12  . insulin aspart (NOVOLOG) 100 UNIT/ML  injection Inject 0-15 Units into the skin 3 (three) times daily with meals. Sliding scale  CBG 70 - 120: 0 units: CBG 121 - 140: 2 units; CBG 140 - 200: 4 units; CBG 201 - 240: 6 units; CBG 240 - 300: 8 units;CBG 300 - 350: 12 units; CBG 351 - 400: 16 units; CBG > 400 : 16 units and notify your  MD 10 mL 3  . Insulin Glargine (LANTUS SOLOSTAR) 100 UNIT/ML Solostar Pen Inject 45 Units into the skin 2 (two) times daily. 15 mL 3  . ondansetron (ZOFRAN) 4 MG tablet Take 1 tablet (4 mg total) by mouth every 6 (six) hours as needed for nausea or vomiting. 12 tablet 0  . potassium chloride SA (KLOR-CON) 20 MEQ tablet Take 1 tablet (20 mEq total) by mouth daily. 5 tablet 0  . rosuvastatin (CRESTOR) 40 MG tablet Take 1 tablet (40 mg total) by mouth daily. 30 tablet 3  . spironolactone (ALDACTONE) 25 MG tablet Take 1 tablet (25 mg total) by mouth daily. 30 tablet 3  . traZODone (DESYREL) 100 MG tablet Take 1 tablet (100 mg total) by mouth at bedtime. For sleep 30 tablet 2  . TRUEPLUS LANCETS 28G MISC 1 each by Does not apply route 3 (three) times daily before meals. 100 each 12  . TRUEPLUS PEN NEEDLES 32G X 4 MM MISC USE TO INJECT LANTUS DAILY. MUST USE NEW PEN NEEDLE WITH EACH INJECTION. 100 each 0  . furosemide (LASIX) 40 MG tablet Take 1.5 tablets (60 mg total) by mouth daily. 45 tablet 3  . potassium chloride (K-DUR) 10 MEQ tablet TAKE 1 TABLET (10 MEQ TOTAL) BY MOUTH DAILY. 30 tablet 3   No current facility-administered medications on file prior to visit.    Observations/Objective: Awake, alert, oriented x3 Not in acute distress   CMP Latest Ref Rng & Units 12/02/2019 09/29/2019 09/29/2019  Glucose 70 - 99 mg/dL 158(H) - 131(H)  BUN 6 - 20 mg/dL 12 - 11  Creatinine 0.61 - 1.24 mg/dL 1.47(H) - 1.26(H)  Sodium 135 - 145 mmol/L 137 142 140  Potassium 3.5 - 5.1 mmol/L 3.1(L) 3.1(L) 3.0(L)  Chloride 98 - 111 mmol/L 104 - 106  CO2 22 - 32 mmol/L 22 - 24  Calcium 8.9 - 10.3 mg/dL 8.4(L) - 8.9  Total  Protein 6.5 - 8.1 g/dL 6.9 - 7.2  Total Bilirubin 0.3 - 1.2 mg/dL 0.5 - 0.6  Alkaline Phos 38 - 126 U/L 69 - 72  AST 15 - 41 U/L 26 - 24  ALT 0 - 44 U/L 23 - 18    Lab Results  Component Value Date   HGBA1C 9.9 (H) 04/25/2019    Lipid Panel     Component Value Date/Time   CHOL 137 04/25/2019 0925   TRIG  218 (H) 04/25/2019 0925   HDL 36 (L) 04/25/2019 0925   CHOLHDL 3.8 04/25/2019 0925   CHOLHDL 4.9 02/01/2017 0259   VLDL 42 (H) 02/01/2017 0259   LDLCALC 57 04/25/2019 0925   LABVLDL 44 (H) 04/25/2019 0925    Assessment and Plan: 1. Hypertensive heart disease with chronic systolic congestive heart failure (HCC) EF 20% from 2019; s/p ICD Euvolemic Continue Spironolactone, Coreg, Furosemide, Entresto Keep appointment with Cardiology - spironolactone (ALDACTONE) 25 MG tablet; Take 1 tablet (25 mg total) by mouth daily.  Dispense: 30 tablet; Refill: 3 - potassium chloride (KLOR-CON) 10 MEQ tablet; Take 1 tablet (10 mEq total) by mouth daily.  Dispense: 30 tablet; Refill: 3 - furosemide (LASIX) 40 MG tablet; Take 1.5 tablets (60 mg total) by mouth daily.  Dispense: 45 tablet; Refill: 3 - carvedilol (COREG) 12.5 MG tablet; Take 1 tablet (12.5 mg total) by mouth 2 (two) times daily with a meal.  Dispense: 60 tablet; Refill: 3  2. Cough Sinus related - cetirizine (ZYRTEC) 10 MG tablet; Take 1 tablet (10 mg total) by mouth daily.  Dispense: 30 tablet; Refill: 1  3. Hyperlipidemia associated with type 2 diabetes mellitus (HCC) LDL at goal Continue statin, omega 3 for triglycerides - Lipid panel; Future - rosuvastatin (CRESTOR) 40 MG tablet; Take 1 tablet (40 mg total) by mouth daily.  Dispense: 30 tablet; Refill: 3  4. Uncontrolled type 2 diabetes mellitus with hyperglycemia, with long-term current use of insulin (HCC) Uncontrolled Will order A1c and adjust regimen accordingly - Hemoglobin A1c; Future - Microalbumin/Creatinine Ratio, Urine; Future - Ambulatory referral to  Ophthalmology - Insulin Glargine (LANTUS SOLOSTAR) 100 UNIT/ML Solostar Pen; Inject 45 Units into the skin 2 (two) times daily.  Dispense: 15 mL; Refill: 3  5. Diabetic polyneuropathy associated with type 2 diabetes mellitus (HCC) Stable - gabapentin (NEURONTIN) 400 MG capsule; TAKE 1 CAPSULE BY MOUTH 3 TAKE TIMES DAILY.  Dispense: 90 capsule; Refill: 3  6. Screening for colon cancer - Ambulatory referral to Gastroenterology   Follow Up Instructions: Return in about 3 months (around 04/06/2020), or if symptoms worsen or fail to improve, for Chronic medical conditions.    I discussed the assessment and treatment plan with the patient. The patient was provided an opportunity to ask questions and all were answered. The patient agreed with the plan and demonstrated an understanding of the instructions.   The patient was advised to call back or seek an in-person evaluation if the symptoms worsen or if the condition fails to improve as anticipated.     I provided 20 minutes total of non-face-to-face time during this encounter including median intraservice time, reviewing previous notes, investigations, ordering medications, medical decision making, coordinating care and patient verbalized understanding at the end of the visit.     Charlott Rakes, MD, FAAFP. [facility], [place]   01/07/2020, 9:12 AM

## 2020-01-08 MED FILL — NovoLOG 100 UNIT/ML SOLN: 100 | 22 days supply | Qty: 10 | Fill #0

## 2020-01-09 ENCOUNTER — Other Ambulatory Visit: Payer: Medicaid Other

## 2020-01-09 ENCOUNTER — Encounter: Payer: Self-pay | Admitting: Family Medicine

## 2020-01-13 ENCOUNTER — Other Ambulatory Visit: Payer: Self-pay | Admitting: Family Medicine

## 2020-01-13 DIAGNOSIS — I110 Hypertensive heart disease with heart failure: Secondary | ICD-10-CM

## 2020-01-13 MED FILL — TRUEPLUS PEN NDL 32GX5/32": 32G X 4 MM | 25 days supply | Qty: 100 | Fill #0

## 2020-01-13 MED FILL — TRUEPLUS PEN NDL 32GX5/32: 32G X 4 MM | 25 days supply | Qty: 100 | Fill #0

## 2020-01-13 MED FILL — ENTRESTO 49 MG-51 MG TABLET: 49-51 | 30 days supply | Qty: 60 | Fill #3

## 2020-03-05 LAB — HM DIABETES EYE EXAM

## 2020-03-06 ENCOUNTER — Encounter: Payer: Self-pay | Admitting: Family Medicine

## 2020-04-01 ENCOUNTER — Ambulatory Visit (INDEPENDENT_AMBULATORY_CARE_PROVIDER_SITE_OTHER): Payer: Medicaid Other | Admitting: *Deleted

## 2020-04-01 DIAGNOSIS — I428 Other cardiomyopathies: Secondary | ICD-10-CM | POA: Diagnosis not present

## 2020-04-01 LAB — CUP PACEART REMOTE DEVICE CHECK
Battery Remaining Longevity: 116 mo
Battery Voltage: 3.01 V
Brady Statistic RV Percent Paced: 0.01 %
Date Time Interrogation Session: 20210421031706
HighPow Impedance: 62 Ohm
Implantable Lead Implant Date: 20190204
Implantable Lead Location: 753860
Implantable Pulse Generator Implant Date: 20190204
Lead Channel Impedance Value: 323 Ohm
Lead Channel Impedance Value: 380 Ohm
Lead Channel Pacing Threshold Amplitude: 1.25 V
Lead Channel Pacing Threshold Pulse Width: 0.4 ms
Lead Channel Sensing Intrinsic Amplitude: 12.375 mV
Lead Channel Sensing Intrinsic Amplitude: 12.375 mV
Lead Channel Setting Pacing Amplitude: 2.75 V
Lead Channel Setting Pacing Pulse Width: 0.4 ms
Lead Channel Setting Sensing Sensitivity: 0.3 mV

## 2020-04-01 NOTE — Progress Notes (Signed)
ICD Remote  

## 2020-04-04 ENCOUNTER — Other Ambulatory Visit (HOSPITAL_COMMUNITY)
Admission: RE | Admit: 2020-04-04 | Discharge: 2020-04-04 | Disposition: A | Discharge status: Home / Self Care | Payer: Medicaid Other | Source: Ambulatory Visit | Attending: Ophthalmology | Admitting: Ophthalmology

## 2020-04-04 DIAGNOSIS — Z01812 Encounter for preprocedural laboratory examination: Secondary | ICD-10-CM | POA: Insufficient documentation

## 2020-04-04 DIAGNOSIS — Z20822 Contact with and (suspected) exposure to covid-19: Secondary | ICD-10-CM | POA: Insufficient documentation

## 2020-04-04 LAB — SARS CORONAVIRUS 2 (TAT 6-24 HRS): SARS Coronavirus 2: NEGATIVE

## 2020-04-06 ENCOUNTER — Encounter: Payer: Self-pay | Admitting: Emergency Medicine

## 2020-04-06 ENCOUNTER — Other Ambulatory Visit: Payer: Self-pay

## 2020-04-06 ENCOUNTER — Encounter (HOSPITAL_COMMUNITY): Payer: Self-pay | Admitting: Vascular Surgery

## 2020-04-06 ENCOUNTER — Ambulatory Visit: Payer: Self-pay | Admitting: Ophthalmology

## 2020-04-06 ENCOUNTER — Encounter (HOSPITAL_COMMUNITY): Payer: Self-pay | Admitting: Ophthalmology

## 2020-04-06 NOTE — Progress Notes (Signed)
Anesthesia Chart Review: SAME DAY WORK-UP   Case: [id number] Date/Time: 04/07/20 1330   Procedure: PARS PLANA VITRECTOMY WITH 25 GAUGE WITH MEMBRANE PEEL WITH GAS SILICONE OIL AND ENDOLASER AND PHOTOCOAGULATION (Right )   Anesthesia type: Monitor Anesthesia Care   Pre-op diagnosis: TRACTION DETACHMENT OF RETINA RIGHT EYE   Location: [place] OR ROOM 08 / [place] OR   Surgeons: Jalene Mullet, MD      DISCUSSION: Patient is a 54 year old male scheduled for the above procedure.  History includes DM2, HTN, CAD (non-obstructive, 0454), chronic systolic CHF (diagnosed 0981), nonischemic cardiomyopathy, ICD (Medtronic [id number] Visia AF MRI VR ICD 01/15/18), diabetic foot ulcer (s/p right great and 2nd toe amputations 2017), HLD.  Positive COVID-19 12/03/2019.  Last cardiology evaluation was in July 2020.  Last ICD remote interrogation appears to be just last week.  Perioperative cardiac device prescription still pending from [facility],   He is a same-day work-up, so labs and anesthesia team evaluation on the day of surgery.  04/04/2020 presurgical COVID-19 test negative.   VS: Day of surgery   PROVIDERS: Charlott Rakes, MD is PCP. Last evaluation 01/07/20.  Virl Axe, MD is EP cardiologist. Last evaluation 06/24/19. CHF stable. No recurrent syncope.   LABS: He is for labs on the day of surgery. As of 12/02/19, Cr 1.47, H/H 11.7/38.2.    IMAGES: CT Chest 12/03/19 (in setting of active COVID-19): IMPRESSION: 1. Patchy bilateral ground-glass infiltrates suggesting atypical/viral pneumonia such as COVID pneumonia. 2. No worrisome pulmonary lesions or pleural effusion. 3. Mild stable cardiac enlargement.   EKG: 12/10/19: NSR, possible inferior infarct (age undetermined)   CV: Echo 01/14/18: Study Conclusions  - Left ventricle: The cavity size was normal. Wall thickness was  increased in a pattern of mild LVH. Systolic function was  severely reduced. The estimated ejection fraction was  20%.  Diffuse hypokinesis. Features are consistent with a pseudonormal  left ventricular filling pattern, with concomitant abnormal  relaxation and increased filling pressure (grade 2 diastolic  dysfunction). Doppler parameters are consistent with high  ventricular filling pressure.  - Left atrium: The atrium was moderately dilated.  - Right ventricle: The cavity size was mildly dilated. Systolic  function was moderately to severely reduced.  (Comparison: LVEF 20-25% 01/29/17 echo; 10-15% 01/31/17 cath)   Cardiac cath 01/31/17:  1st RPLB lesion, 60 %stenosed.  Dist RCA lesion, 60 %stenosed.  Prox RCA lesion, 55 %stenosed.  Prox LAD lesion, 10 %stenosed.  There is severe left ventricular systolic dysfunction.  LV end diastolic pressure is mildly elevated. - Severe global LV dysfunction with an ejection fraction of 10-15%.  The pattern is one of a nonischemic cardiomyopathy. - Mild coronary obstructive disease with smooth 10% narrowing in the LAD; normal ramus intermediate, normal left circumflex; and RCA with 50-60% proximal stenosis and distal tapering of 60%.  Prior to giving rise to 3 small distal branches with 60% narrowing in a small inferior LV branch.  The patient's LV dysfunction is out of proportion to his CAD. - Initiation of medical therapy with carvedilol, spironolactone, and probable initiation of angiotension receptorblocker/neprilysin inhibition therapy.  Consider short-term life-vest prior to discharge to allow for possible medication induced improvement in LV function. [S/p ICD 01/15/18]   Past Medical History:  Diagnosis Date  . CAD (coronary artery disease)    a. cath 01/31/17: 60% 1st RPLB, 60% dist RCA, 55% prox RCA, 10% pro LAD --> Rx TX.   Marland Kitchen Chronic systolic CHF (congestive heart failure) ([place]) 01/28/2017   1. Echo  01/29/17:  EF 20-25, normal wall motion, mild LAE // 2. EF 10-15 [facility] 01/2017   . Diabetes mellitus   . Diabetic foot infection ([place]) 03/2016    RT FOOT  . HTN (hypertension)   . Hyperlipidemia   . NICM (nonischemic cardiomyopathy) ([place]) 02/15/2017   1. Mod non-obs CAD on LHC in 01/2017 - CAD does not explain cardiomyopathy    Past Surgical History:  Procedure Laterality Date  . AMPUTATION Right 04/01/2016   Procedure: Right Great Toe Amputation;  Surgeon: Newt Minion, MD;  Location: [place];  Service: Orthopedics;  Laterality: Right;  . AMPUTATION Right 06/19/2016   Procedure: AMPUTATION SECOND TOE;  Surgeon: Marybelle Killings, MD;  Location: [place];  Service: Orthopedics;  Laterality: Right;  . AMPUTATION TOE Right   . BACK SURGERY     for abscess  . ICD IMPLANT N/A 01/15/2018   Procedure: ICD IMPLANT;  Surgeon: Deboraha Sprang, MD;  Location: [place] CV LAB;  Service: Cardiovascular;  Laterality: N/A;  . RIGHT/LEFT HEART CATH AND CORONARY ANGIOGRAPHY N/A 01/31/2017   Procedure: Right/Left Heart Cath and Coronary Angiography;  Surgeon: Troy Sine, MD;  Location: [place] CV LAB;  Service: Cardiovascular;  Laterality: N/A;    MEDICATIONS: No current facility-administered medications for this encounter.   Marland Kitchen albuterol (PROVENTIL HFA;VENTOLIN HFA) 108 (90 Base) MCG/ACT inhaler  . aspirin EC 81 MG EC tablet  . Aspirin-Acetaminophen-Caffeine (GOODY HEADACHE PO)  . carvedilol (COREG) 12.5 MG tablet  . cetirizine (ZYRTEC) 10 MG tablet  . cyclobenzaprine (FLEXERIL) 5 MG tablet  . ENTRESTO 49-51 MG  . ferrous sulfate 325 (65 FE) MG tablet  . furosemide (LASIX) 40 MG tablet  . gabapentin (NEURONTIN) 400 MG capsule  . insulin aspart (NOVOLOG) 100 UNIT/ML injection  . Insulin Glargine (LANTUS SOLOSTAR) 100 UNIT/ML Solostar Pen  . potassium chloride (KLOR-CON) 10 MEQ tablet  . rosuvastatin (CRESTOR) 40 MG tablet  . spironolactone (ALDACTONE) 25 MG tablet  . Blood Glucose Monitoring Suppl (TRUE METRIX METER) DEVI  . glucose blood (TRUE METRIX BLOOD GLUCOSE TEST) test strip  . ondansetron (ZOFRAN) 4 MG tablet  . traZODone  (DESYREL) 100 MG tablet  . TRUEPLUS LANCETS 28G MISC  . TRUEPLUS PEN NEEDLES 32G X 4 MM MISC     Myra Gianotti, PA-C Surgical Short Stay/Anesthesiology [facility] Phone [phone] [facility] Phone [phone] 04/06/2020 1:04 PM

## 2020-04-06 NOTE — Progress Notes (Signed)
Mr. Jeffrey [name] denies chest pain or shortness of breath. Mr. Jeffrey [name] tested negative for Covid 04/04/20 and is with family. [name] has type II diabetes," he does not check CBG's or eat right , hi wife reported." I instructed patient to take 22 units Lantus tonight. Lantus 22 units in am if CBG is > than 70. I instructed patient to check CBG after awaking and every 2 hours until arrival  to the hospital.  I Instructed patient if CBG is less than 70 to drink 1/2 cup of a clear juice. Recheck CBG in 15 minutes then call pre- op desk at [phone] for further instructions. If scheduled to receive Insulin, do not take Insulin. If CBG > 220 take 1/2 of Novolog SS dose.

## 2020-04-06 NOTE — Anesthesia Preprocedure Evaluation (Deleted)
Anesthesia Evaluation Anesthesia Physical Anesthesia Plan  ASA:   Anesthesia Plan:    Post-op Pain Management:    Induction:   PONV Risk Score and Plan:   Airway Management Planned:   Additional Equipment:   Intra-op Plan:   Post-operative Plan:   Informed Consent:   Plan Discussed with:   Anesthesia Plan Comments: (PAT note written 04/06/2020 by Myra Gianotti, PA-C. SAME DAY WORK-UP   )        Anesthesia Quick Evaluation

## 2020-04-06 NOTE — Progress Notes (Signed)
PERIOPERATIVE PRESCRIPTION FOR IMPLANTED CARDIAC DEVICE PROGRAMMING  Patient Information: Name:  Jeffrey [name]  DOB:  1966/08/03  MRN:  [id number]    Planned Procedure: Eye surgery  Surgeon: Dr. Sammie Bench  Date of Procedure: 04/07/2020  Cautery will be used.  Position during surgery: Supine   Please send documentation back to:  [facility] (Fax # [phone])    Device Information:  Clinic EP Physician:  Virl Axe, MD   Device Type:  Defibrillator Manufacturer and Phone #:  Medtronic: [id number] Pacemaker Dependent?:  No. Date of Last Device Check:04/02/2020* Normal Device Function?:  Yes.    Electrophysiologist's Recommendations:   Have magnet available.  Provide continuous ECG monitoring when magnet is used or reprogramming is to be performed.   Procedure may interfere with device function.  Magnet should be placed over device during procedure.  Per Device Clinic [address] Drake Leach, RN  4:54 PM 04/06/2020

## 2020-04-07 ENCOUNTER — Ambulatory Visit (HOSPITAL_COMMUNITY): Admission: RE | Admit: 2020-04-07 | Payer: Medicaid Other | Source: Home / Self Care | Admitting: Ophthalmology

## 2020-04-07 HISTORY — DX: Personal history of urinary calculi: Z87.442

## 2020-04-07 HISTORY — DX: Dyspnea, unspecified: R06.00

## 2020-04-07 SURGERY — PARS PLANA VITRECTOMY WITH 25 GAUGE
Anesthesia: Monitor Anesthesia Care | Laterality: Right

## 2020-05-26 MED FILL — LANTUS SOLOSTAR 100 UNITS/M: 100 | 32 days supply | Qty: 30 | Fill #1

## 2020-05-26 MED FILL — ROSUVASTATIN CALCIUM 40 MG: 40 | 30 days supply | Qty: 30 | Fill #1

## 2020-05-26 MED FILL — NovoLOG 100 UNIT/ML SOLN: 100 | 22 days supply | Qty: 10 | Fill #1

## 2020-05-26 MED FILL — CETIRIZINE HCL 10 MG TABS: 10 | 30 days supply | Qty: 30 | Fill #1

## 2020-05-26 MED FILL — FUROSEMIDE 40 MG TAB: 40 | 30 days supply | Qty: 45 | Fill #1

## 2020-05-26 MED FILL — ENTRESTO 49 MG-51 MG TABLET: 49-51 | 30 days supply | Qty: 60 | Fill #4

## 2020-05-26 MED FILL — CARVEDILOL 12.5 MG TABLET: 12.5 | 30 days supply | Qty: 60 | Fill #1

## 2020-05-26 MED FILL — POTASSIUM CHLORIDE ER 10 ME: 10 | 30 days supply | Qty: 30 | Fill #1

## 2020-05-26 MED FILL — SPIRONOLACTONE 25 MG TABLET: 25 | 30 days supply | Qty: 30 | Fill #1

## 2020-06-12 ENCOUNTER — Emergency Department (HOSPITAL_COMMUNITY): Payer: Medicaid Other

## 2020-06-12 ENCOUNTER — Other Ambulatory Visit: Payer: Self-pay

## 2020-06-12 ENCOUNTER — Encounter (HOSPITAL_COMMUNITY): Payer: Self-pay

## 2020-06-12 ENCOUNTER — Inpatient Hospital Stay (HOSPITAL_COMMUNITY)
Admission: EM | Admit: 2020-06-12 | Discharge: 2020-06-16 | DRG: 638 | Disposition: A | Discharge status: Home / Self Care | Payer: Medicaid Other | Attending: Family Medicine | Admitting: Family Medicine

## 2020-06-12 DIAGNOSIS — Z20822 Contact with and (suspected) exposure to covid-19: Secondary | ICD-10-CM | POA: Diagnosis present

## 2020-06-12 DIAGNOSIS — I110 Hypertensive heart disease with heart failure: Secondary | ICD-10-CM | POA: Diagnosis present

## 2020-06-12 DIAGNOSIS — Z95810 Presence of automatic (implantable) cardiac defibrillator: Secondary | ICD-10-CM

## 2020-06-12 DIAGNOSIS — R55 Syncope and collapse: Secondary | ICD-10-CM

## 2020-06-12 DIAGNOSIS — Z9114 Patient's other noncompliance with medication regimen: Secondary | ICD-10-CM | POA: Diagnosis not present

## 2020-06-12 DIAGNOSIS — E871 Hypo-osmolality and hyponatremia: Secondary | ICD-10-CM | POA: Diagnosis present

## 2020-06-12 DIAGNOSIS — I083 Combined rheumatic disorders of mitral, aortic and tricuspid valves: Secondary | ICD-10-CM | POA: Diagnosis present

## 2020-06-12 DIAGNOSIS — I428 Other cardiomyopathies: Secondary | ICD-10-CM | POA: Diagnosis present

## 2020-06-12 DIAGNOSIS — I5042 Chronic combined systolic (congestive) and diastolic (congestive) heart failure: Secondary | ICD-10-CM | POA: Diagnosis present

## 2020-06-12 DIAGNOSIS — Z8249 Family history of ischemic heart disease and other diseases of the circulatory system: Secondary | ICD-10-CM

## 2020-06-12 DIAGNOSIS — R32 Unspecified urinary incontinence: Secondary | ICD-10-CM | POA: Diagnosis present

## 2020-06-12 DIAGNOSIS — E1121 Type 2 diabetes mellitus with diabetic nephropathy: Secondary | ICD-10-CM | POA: Diagnosis present

## 2020-06-12 DIAGNOSIS — E861 Hypovolemia: Secondary | ICD-10-CM | POA: Diagnosis present

## 2020-06-12 DIAGNOSIS — W19XXXA Unspecified fall, initial encounter: Secondary | ICD-10-CM | POA: Diagnosis present

## 2020-06-12 DIAGNOSIS — Z833 Family history of diabetes mellitus: Secondary | ICD-10-CM

## 2020-06-12 DIAGNOSIS — E785 Hyperlipidemia, unspecified: Secondary | ICD-10-CM | POA: Diagnosis present

## 2020-06-12 DIAGNOSIS — Z87442 Personal history of urinary calculi: Secondary | ICD-10-CM | POA: Diagnosis not present

## 2020-06-12 DIAGNOSIS — N049 Nephrotic syndrome with unspecified morphologic changes: Secondary | ICD-10-CM

## 2020-06-12 DIAGNOSIS — E1100 Type 2 diabetes mellitus with hyperosmolarity without nonketotic hyperglycemic-hyperosmolar coma (NKHHC): Principal | ICD-10-CM | POA: Diagnosis present

## 2020-06-12 DIAGNOSIS — D509 Iron deficiency anemia, unspecified: Secondary | ICD-10-CM | POA: Diagnosis present

## 2020-06-12 DIAGNOSIS — Z794 Long term (current) use of insulin: Secondary | ICD-10-CM | POA: Diagnosis not present

## 2020-06-12 DIAGNOSIS — Y92000 Kitchen of unspecified non-institutional (private) residence as  the place of occurrence of the external cause: Secondary | ICD-10-CM | POA: Diagnosis not present

## 2020-06-12 DIAGNOSIS — R739 Hyperglycemia, unspecified: Secondary | ICD-10-CM

## 2020-06-12 DIAGNOSIS — R05 Cough: Secondary | ICD-10-CM | POA: Diagnosis present

## 2020-06-12 DIAGNOSIS — N179 Acute kidney failure, unspecified: Secondary | ICD-10-CM | POA: Diagnosis present

## 2020-06-12 DIAGNOSIS — E1140 Type 2 diabetes mellitus with diabetic neuropathy, unspecified: Secondary | ICD-10-CM | POA: Diagnosis present

## 2020-06-12 DIAGNOSIS — R801 Persistent proteinuria, unspecified: Secondary | ICD-10-CM | POA: Diagnosis not present

## 2020-06-12 DIAGNOSIS — E1165 Type 2 diabetes mellitus with hyperglycemia: Secondary | ICD-10-CM | POA: Diagnosis present

## 2020-06-12 DIAGNOSIS — M47812 Spondylosis without myelopathy or radiculopathy, cervical region: Secondary | ICD-10-CM | POA: Diagnosis present

## 2020-06-12 DIAGNOSIS — I2510 Atherosclerotic heart disease of native coronary artery without angina pectoris: Secondary | ICD-10-CM | POA: Diagnosis present

## 2020-06-12 DIAGNOSIS — Z79899 Other long term (current) drug therapy: Secondary | ICD-10-CM

## 2020-06-12 LAB — URINALYSIS, ROUTINE W REFLEX MICROSCOPIC
Bilirubin Urine: NEGATIVE
Glucose, UA: >=500 mg/dL — AB
Ketones, ur: NEGATIVE mg/dL
Nitrite: NEGATIVE
Protein, ur: >=300 mg/dL — AB
Specific Gravity, Urine: 1.028 (ref 1.005–1.030)
WBC, UA: >50 WBC/hpf — ABNORMAL HIGH (ref 0–5)
pH: 5 (ref 5.0–8.0)

## 2020-06-12 LAB — HEPATIC FUNCTION PANEL
ALT: 16 U/L (ref 0–44)
AST: 21 U/L (ref 15–41)
Albumin: 3.2 g/dL — ABNORMAL LOW (ref 3.5–5.0)
Alkaline Phosphatase: 86 U/L (ref 38–126)
Bilirubin, Direct: 0.3 mg/dL — ABNORMAL HIGH (ref 0.0–0.2)
Indirect Bilirubin: 0.2 mg/dL — ABNORMAL LOW (ref 0.3–0.9)
Total Bilirubin: 0.5 mg/dL (ref 0.3–1.2)
Total Protein: 7 g/dL (ref 6.5–8.1)

## 2020-06-12 LAB — CBC
HCT: 38.6 % — ABNORMAL LOW (ref 39.0–52.0)
Hemoglobin: 11.7 g/dL — ABNORMAL LOW (ref 13.0–17.0)
MCH: 24.2 pg — ABNORMAL LOW (ref 26.0–34.0)
MCHC: 30.3 g/dL (ref 30.0–36.0)
MCV: 79.9 fL — ABNORMAL LOW (ref 80.0–100.0)
Platelets: 236 10*3/uL (ref 150–400)
RBC: 4.83 MIL/uL (ref 4.22–5.81)
RDW: 13.8 % (ref 11.5–15.5)
WBC: 6.3 10*3/uL (ref 4.0–10.5)
nRBC: 0 % (ref 0.0–0.2)

## 2020-06-12 LAB — SARS CORONAVIRUS 2 BY RT PCR (HOSPITAL ORDER, PERFORMED IN ~~LOC~~ HOSPITAL LAB): SARS Coronavirus 2: NEGATIVE

## 2020-06-12 LAB — CBG MONITORING, ED
Glucose-Capillary: 446 mg/dL — ABNORMAL HIGH (ref 70–99)
Glucose-Capillary: 345 mg/dL — ABNORMAL HIGH (ref 70–99)
Glucose-Capillary: 334 mg/dL — ABNORMAL HIGH (ref 70–99)

## 2020-06-12 LAB — BASIC METABOLIC PANEL
Anion gap: 8 (ref 5–15)
BUN: 14 mg/dL (ref 6–20)
CO2: 26 mmol/L (ref 22–32)
Calcium: 8.8 mg/dL — ABNORMAL LOW (ref 8.9–10.3)
Chloride: 99 mmol/L (ref 98–111)
Creatinine, Ser: 1.48 mg/dL — ABNORMAL HIGH (ref 0.61–1.24)
GFR calc Af Amer: >60 mL/min (ref >60)
GFR calc non Af Amer: 53 mL/min — ABNORMAL LOW (ref >60)
Glucose, Bld: 454 mg/dL — ABNORMAL HIGH (ref 70–99)
Potassium: 4.1 mmol/L (ref 3.5–5.1)
Sodium: 133 mmol/L — ABNORMAL LOW (ref 135–145)

## 2020-06-12 LAB — I-STAT CHEM 8, ED
BUN: 18 mg/dL (ref 6–20)
Calcium, Ion: 1.24 mmol/L (ref 1.15–1.40)
Chloride: 96 mmol/L — ABNORMAL LOW (ref 98–111)
Creatinine, Ser: 1.4 mg/dL — ABNORMAL HIGH (ref 0.61–1.24)
Glucose, Bld: 464 mg/dL — ABNORMAL HIGH (ref 70–99)
HCT: 38 % — ABNORMAL LOW (ref 39.0–52.0)
Hemoglobin: 12.9 g/dL — ABNORMAL LOW (ref 13.0–17.0)
Potassium: 4.1 mmol/L (ref 3.5–5.1)
Sodium: 136 mmol/L (ref 135–145)
TCO2: 30 mmol/L (ref 22–32)

## 2020-06-12 LAB — BRAIN NATRIURETIC PEPTIDE: B Natriuretic Peptide: 23.2 pg/mL (ref 0.0–100.0)

## 2020-06-12 LAB — MAGNESIUM: Magnesium: 1.6 mg/dL — ABNORMAL LOW (ref 1.7–2.4)

## 2020-06-12 MED ORDER — INSULIN GLARGINE 100 UNIT/ML ~~LOC~~ SOLN
20.0000 [IU] | Freq: Every day | SUBCUTANEOUS | Status: DC
  Administered 2020-06-13 – 2020-06-14 (×2): 20 [IU] via SUBCUTANEOUS
  Filled 2020-06-12 (×3): qty 0.2

## 2020-06-12 MED ORDER — MAGNESIUM SULFATE 2 GM/50ML IV SOLN
2.0000 g | Freq: Once | INTRAVENOUS | Status: AC
  Administered 2020-06-12: 2 g via INTRAVENOUS
  Filled 2020-06-12: qty 50

## 2020-06-12 MED ORDER — FERROUS SULFATE 325 (65 FE) MG PO TABS
325.0000 mg | ORAL_TABLET | Freq: Every day | ORAL | Status: DC
  Administered 2020-06-13 – 2020-06-16 (×4): 325 mg via ORAL
  Filled 2020-06-12 (×4): qty 1

## 2020-06-12 MED ORDER — GABAPENTIN 400 MG PO CAPS
400.0000 mg | ORAL_CAPSULE | Freq: Three times a day (TID) | ORAL | Status: DC
  Administered 2020-06-12 – 2020-06-16 (×11): 400 mg via ORAL
  Filled 2020-06-12 (×11): qty 1

## 2020-06-12 MED ORDER — POTASSIUM CHLORIDE IN NACL 40-0.9 MEQ/L-% IV SOLN
INTRAVENOUS | Status: DC
  Filled 2020-06-12 (×2): qty 1000

## 2020-06-12 MED ORDER — ENOXAPARIN SODIUM 40 MG/0.4ML ~~LOC~~ SOLN
40.0000 mg | SUBCUTANEOUS | Status: DC
  Administered 2020-06-12 – 2020-06-15 (×4): 40 mg via SUBCUTANEOUS
  Filled 2020-06-12 (×4): qty 0.4

## 2020-06-12 MED ORDER — ACETAMINOPHEN 650 MG RE SUPP: 650.0000 mg | Freq: Four times a day (QID) | RECTAL | Status: DC | PRN

## 2020-06-12 MED ORDER — INSULIN ASPART 100 UNIT/ML ~~LOC~~ SOLN
12.0000 [IU] | Freq: Once | SUBCUTANEOUS | Status: AC
  Administered 2020-06-12: 12 [IU] via SUBCUTANEOUS

## 2020-06-12 MED ORDER — ALBUTEROL SULFATE (2.5 MG/3ML) 0.083% IN NEBU: 2.5000 mg | INHALATION_SOLUTION | Freq: Four times a day (QID) | RESPIRATORY_TRACT | Status: DC | PRN

## 2020-06-12 MED ORDER — ACETAMINOPHEN 325 MG PO TABS
650.0000 mg | ORAL_TABLET | Freq: Four times a day (QID) | ORAL | Status: DC | PRN
  Administered 2020-06-13 – 2020-06-15 (×5): 650 mg via ORAL
  Filled 2020-06-12 (×5): qty 2

## 2020-06-12 MED ORDER — FENTANYL CITRATE (PF) 100 MCG/2ML IJ SOLN
50.0000 ug | Freq: Once | INTRAMUSCULAR | Status: AC
  Administered 2020-06-12: 50 ug via INTRAVENOUS
  Filled 2020-06-12: qty 2

## 2020-06-12 MED ORDER — CARVEDILOL 12.5 MG PO TABS
12.5000 mg | ORAL_TABLET | Freq: Two times a day (BID) | ORAL | Status: DC
  Administered 2020-06-13 – 2020-06-16 (×7): 12.5 mg via ORAL
  Filled 2020-06-12 (×7): qty 1

## 2020-06-12 MED ORDER — SODIUM CHLORIDE 0.9% FLUSH
3.0000 mL | Freq: Once | INTRAVENOUS | Status: AC
  Administered 2020-06-12: 3 mL via INTRAVENOUS

## 2020-06-12 MED ORDER — INSULIN ASPART 100 UNIT/ML ~~LOC~~ SOLN
0.0000 [IU] | Freq: Three times a day (TID) | SUBCUTANEOUS | Status: DC
  Administered 2020-06-13: 9 [IU] via SUBCUTANEOUS
  Administered 2020-06-13: 7 [IU] via SUBCUTANEOUS
  Administered 2020-06-13: 3 [IU] via SUBCUTANEOUS

## 2020-06-12 MED ORDER — INSULIN ASPART 100 UNIT/ML ~~LOC~~ SOLN: 16.0000 [IU] | Freq: Once | SUBCUTANEOUS | Status: DC

## 2020-06-12 MED ORDER — ROSUVASTATIN CALCIUM 20 MG PO TABS
40.0000 mg | ORAL_TABLET | Freq: Every day | ORAL | Status: DC
  Administered 2020-06-13 – 2020-06-16 (×4): 40 mg via ORAL
  Filled 2020-06-12 (×4): qty 2

## 2020-06-12 NOTE — ED Notes (Signed)
Pt visitor, [name], would like an update when pt goes upstairs. Contact number is in the chart.

## 2020-06-12 NOTE — ED Notes (Signed)
Interrogation report given to EDP.

## 2020-06-12 NOTE — ED Triage Notes (Signed)
Pt BIB [facility] from home with Syncopal Episode and fall. Pt had a fall a few days ago with similar presentation (did not seek medical attention at that time).   Pt does not remember fall but wife stated he was on floor unconscious.  Hx of Diabetes, Defibrillator.   Pt is A&Ox4, GCS 15. Negative for Blood Thinners.   CBG 546 with EMS BP 170/90 All other VSS with EMS

## 2020-06-12 NOTE — ED Provider Notes (Signed)
Jeffrey [name]   CSN: [id number] Arrival date & time: 06/12/20  1612     History Chief Complaint  Patient presents with  . Loss of Consciousness    Jeffrey [name] is a 54 y.o. male.  The history is provided by the patient, medical records, the EMS personnel and a significant other. No language interpreter was used.  Loss of Consciousness  Jeffrey [name] is a 54 y.o. male who presents to the Emergency Department complaining of syncope.  Level V caveat due to confusion.  He was at home when he had a syncopal event.  He states he was sitting on the bed and he woke up on the floor.    On ED arrival he complains of neck pain.  He has been coughing for one to two weeks.  Denies chest pain, headache, abdominal pain.  He was vomiting a few days ago - now resolved.  Lives with girlfriend.    Additional hx available from girlfriend.  Passed out two days ago - during first episode he hit his head and was complaining of HA and neck pain.  Today he passed out.  He sat down and was unconscious for 1-2 minutes.  No preceeding sxs.  No recent medication changes.  He has not been consistent with taking his medications.  He has been not following a diabetic diet.    He has been experiencing coughing spells for the last few weeks.  No hemoptysis.     No alcohol, tobacco, street drugs.   Past Medical History:  Diagnosis Date  . CAD (coronary artery disease)    a. cath 01/31/17: 60% 1st RPLB, 60% dist RCA, 55% prox RCA, 10% pro LAD --> Rx TX.   Marland Kitchen Chronic systolic CHF (congestive heart failure) ([place]) 01/28/2017   1. Echo 01/29/17:  EF 20-25, normal wall motion, mild LAE // 2. EF 10-15 by [facility] 01/2017   . Diabetes mellitus    type II  . Diabetic foot infection ([place]) 03/2016   RT FOOT  . Dyspnea   . History of kidney stones    passed  . HTN (hypertension)   . Hyperlipidemia   . NICM (nonischemic cardiomyopathy) ([place]) 02/15/2017   1. Mod non-obs CAD  on [facility] in 01/2017 - CAD does not explain cardiomyopathy    Patient Active Problem List   Diagnosis Date Noted  . Hypoglycemia 10/16/2018  . Acute metabolic encephalopathy 24/08/7352  . S/P ICD (internal cardiac defibrillator) procedure 01/19/2018  . Cardiac device in situ   . Syncope 01/13/2018  . NICM (nonischemic cardiomyopathy) ([place]) 02/15/2017  . Coronary artery disease involving native coronary artery of native heart without angina pectoris 02/15/2017  . Hyperlipidemia 02/15/2017  . Chronic combined systolic (congestive) and diastolic (congestive) heart failure ([place]) 01/28/2017  . Folliculitis 29/92/4268  . 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 Anemia, iron deficiency   . Noncompliance with medication regimen 03/30/2016  . Uncontrolled type 2 diabetes mellitus with hyperglycemia, with long-term current use of insulin ([place])   . Cellulitis 09/25/2015  . Essential hypertension 08/21/2013  . Diabetic neuropathy ([place]) 08/21/2013  . Fungal toenail infection 08/21/2013    Past Surgical History:  Procedure Laterality Date  . AMPUTATION Right 04/01/2016   Procedure: Right Great Toe Amputation;  Surgeon: Newt Minion, MD;  Location: [facility];  Service: Orthopedics;  Laterality: Right;  . AMPUTATION Right 06/19/2016   Procedure: AMPUTATION SECOND TOE;  Surgeon:  Marybelle Killings, MD;  Location: [facility];  Service: Orthopedics;  Laterality: Right;  . AMPUTATION TOE Right   . BACK SURGERY     for abscess  . ICD IMPLANT N/A 01/15/2018   Procedure: ICD IMPLANT;  Surgeon: Deboraha Sprang, MD;  Location: [facility];  Service: Cardiovascular;  Laterality: N/A;  . RIGHT/LEFT HEART CATH AND CORONARY ANGIOGRAPHY N/A 01/31/2017   Procedure: Right/Left Heart Cath and Coronary Angiography;  Surgeon: Troy Sine, MD;  Location: [facility];  Service: Cardiovascular;  Laterality: N/A;       Family History  Problem Relation Age of Onset  . Diabetes Mother    . Hypertension Mother   . Diabetes Father   . Heart attack Father   . Diabetes Sister   . Heart attack Maternal Grandmother     Social History   Tobacco Use  . Smoking status: Never Smoker  . Smokeless tobacco: Never Used  Vaping Use  . Vaping Use: Never used  Substance Use Topics  . Alcohol use: No  . Drug use: No    Home Medications Prior to Admission medications   Medication Sig Start Date End Date Taking? Authorizing Provider  albuterol (PROVENTIL HFA;VENTOLIN HFA) 108 (90 Base) MCG/ACT inhaler Inhale 1-2 puffs into the lungs every 6 (six) hours as needed for wheezing or shortness of breath. 11/15/16  Yes Recardo Evangelist, PA-C  Aspirin-Acetaminophen-Caffeine (GOODY HEADACHE PO) Take 2 packets by mouth as needed (for dental pain).    Yes [provider]  carvedilol (COREG) 12.5 MG tablet Take 1 tablet (12.5 mg total) by mouth 2 (two) times daily with a meal. 01/07/20  Yes Newlin, Enobong, MD  cyclobenzaprine (FLEXERIL) 5 MG tablet Take 1 tablet (5 mg total) by mouth at bedtime as needed for muscle spasms. 04/24/19  Yes Newlin, Enobong, MD  ENTRESTO 49-51 MG TAKE 1 TABLET BY MOUTH 2 TIMES DAILY. Patient taking differently: Take 1 tablet by mouth 2 (two) times daily.  07/15/19  Yes Deboraha Sprang, MD  ferrous sulfate 325 (65 FE) MG tablet Take 1 tablet (325 mg total) by mouth 2 (two) times daily with a meal. Patient taking differently: Take 325 mg by mouth daily.  04/03/16  Yes Dhungel, Nishant, MD  furosemide (LASIX) 40 MG tablet Take 1.5 tablets (60 mg total) by mouth daily. 01/07/20 06/12/20 Yes Newlin, Charlane Ferretti, MD  gabapentin (NEURONTIN) 400 MG capsule TAKE 1 CAPSULE BY MOUTH 3 TAKE TIMES DAILY. Patient taking differently: Take 400 mg by mouth 3 (three) times daily.  01/07/20  Yes Newlin, Charlane Ferretti, MD  insulin aspart (NOVOLOG) 100 UNIT/ML injection Inject 0-15 Units into the skin 3 (three) times daily with meals. Sliding scale  CBG 70 - 120: 0 units: CBG 121 - 140: 2 units;  CBG 140 - 200: 4 units; CBG 201 - 240: 6 units; CBG 240 - 300: 8 units;CBG 300 - 350: 12 units; CBG 351 - 400: 16 units; CBG > 400 : 16 units and notify your  MD Patient taking differently: Inject 0-16 Units into the skin 3 (three) times daily with meals. Inject 0-16 units into the skin 3 times a day with meals, per sliding scale: BGL 70-120 = use nothing; 121-140= 2 units; 140-200 = 4 units; 201- 240 = 6 units; 240-300 = 8 units; 300-350 = 12 units; 351-400 = 16 units;  > 400 = 16 units and notify your MD 01/07/20  Yes Charlott Rakes, MD  Insulin Glargine (LANTUS SOLOSTAR)  100 UNIT/ML Solostar Pen Inject 45 Units into the skin 2 (two) times daily. Patient taking differently: Inject 45 Units into the skin 2 (two) times daily before a meal.  01/07/20  Yes Newlin, Enobong, MD  ondansetron (ZOFRAN) 4 MG tablet Take 1 tablet (4 mg total) by mouth every 6 (six) hours as needed for nausea or vomiting. 94/76/54  Yes Delora Fuel, MD  potassium chloride (KLOR-CON) 10 MEQ tablet Take 1 tablet (10 mEq total) by mouth daily. 01/07/20 06/12/20 Yes Charlott Rakes, MD  rosuvastatin (CRESTOR) 40 MG tablet Take 1 tablet (40 mg total) by mouth daily. 01/07/20  Yes Charlott Rakes, MD  spironolactone (ALDACTONE) 25 MG tablet Take 1 tablet (25 mg total) by mouth daily. 01/07/20  Yes Charlott Rakes, MD  aspirin EC 81 MG EC tablet Take 1 tablet (81 mg total) by mouth daily. Patient not taking: Reported on 06/12/2020 01/16/18   Raiford Noble Latif, DO  Blood Glucose Monitoring Suppl (TRUE METRIX METER) DEVI 1 each by Does not apply route 3 (three) times daily before meals. 02/15/17   Charlott Rakes, MD  cetirizine (ZYRTEC) 10 MG tablet Take 1 tablet (10 mg total) by mouth daily. Patient taking differently: Take 10 mg by mouth daily as needed for allergies or rhinitis.  01/07/20   Charlott Rakes, MD  glucose blood (TRUE METRIX BLOOD GLUCOSE TEST) test strip Use 3 times daily before meals 02/15/17   Charlott Rakes, MD  traZODone  (DESYREL) 100 MG tablet Take 1 tablet (100 mg total) by mouth at bedtime. For sleep Patient not taking: Reported on 06/12/2020 06/25/19   Charlott Rakes, MD  TRUEPLUS LANCETS 28G MISC 1 each by Does not apply route 3 (three) times daily before meals. 02/15/17   Charlott Rakes, MD  TRUEPLUS PEN NEEDLES 32G X 4 MM MISC USE TO INJECT LANTUS DAILY. MUST USE NEW PEN NEEDLE WITH EACH INJECTION. Patient taking differently: as directed.  12/03/19   Charlott Rakes, MD    Allergies    Patient has no known allergies.  Review of Systems   Review of Systems  Cardiovascular: Positive for syncope.  All other systems reviewed and are negative.   Physical Exam Updated Vital Signs BP (!) 166/99   Pulse 71   Temp 98.8 F (37.1 C) (Oral)   Resp 17   Ht 5\' 8"  (1.727 m)   Wt 95.3 kg   SpO2 93%   BMI 31.93 kg/m   Physical Exam Vitals and nursing [name] reviewed.  Constitutional:      Appearance: He is well-developed.  HENT:     Head: Normocephalic and atraumatic.  Cardiovascular:     Rate and Rhythm: Normal rate and regular rhythm.     Heart sounds: No murmur heard.   Pulmonary:     Effort: Pulmonary effort is normal. No respiratory distress.     Breath sounds: Normal breath sounds.  Abdominal:     Palpations: Abdomen is soft.     Tenderness: There is no abdominal tenderness. There is no guarding or rebound.  Musculoskeletal:        General: No tenderness.     Cervical back: Neck supple. No tenderness.  Skin:    General: Skin is warm and dry.  Neurological:     Mental Status: He is alert and oriented to person, place, and time.     Comments: 5/5 strength in all four extremities with sensation to light touch intact in all four extremities.    Psychiatric:  Behavior: Behavior normal.     ED Results / Procedures / Treatments   Labs (all labs ordered are listed, but only abnormal results are displayed) Labs Reviewed  BASIC METABOLIC PANEL - Abnormal; Notable for the following  components:      Result Value   Sodium 133 (*)    Glucose, Bld 454 (*)    Creatinine, Ser 1.48 (*)    Calcium 8.8 (*)    GFR calc non Af Amer 53 (*)    All other components within normal limits  CBC - Abnormal; Notable for the following components:   Hemoglobin 11.7 (*)    HCT 38.6 (*)    MCV 79.9 (*)    MCH 24.2 (*)    All other components within normal limits  MAGNESIUM - Abnormal; Notable for the following components:   Magnesium 1.6 (*)    All other components within normal limits  HEPATIC FUNCTION PANEL - Abnormal; Notable for the following components:   Albumin 3.2 (*)    Bilirubin, Direct 0.3 (*)    Indirect Bilirubin 0.2 (*)    All other components within normal limits  CBG MONITORING, ED - Abnormal; Notable for the following components:   Glucose-Capillary 446 (*)    All other components within normal limits  CBG MONITORING, ED - Abnormal; Notable for the following components:   Glucose-Capillary 345 (*)    All other components within normal limits  I-STAT CHEM 8, ED - Abnormal; Notable for the following components:   Chloride 96 (*)    Creatinine, Ser 1.40 (*)    Glucose, Bld 464 (*)    Hemoglobin 12.9 (*)    HCT 38.0 (*)    All other components within normal limits  SARS CORONAVIRUS 2 BY RT PCR (HOSPITAL ORDER, [facility])  BRAIN NATRIURETIC PEPTIDE  URINALYSIS, ROUTINE W REFLEX MICROSCOPIC    EKG EKG Interpretation  Date/Time:  Friday June 12 2020 16:19:55 EDT Ventricular Rate:  82 PR Interval:    QRS Duration: 89 QT Interval:  403 QTC Calculation: 471 R Axis:   -13 Text Interpretation: Sinus rhythm Minimal ST elevation, anterior leads Confirmed by Quintella Reichert [phone]) on 06/12/2020 5:19:45 PM   Radiology CT Head Wo Contrast  Result Date: 06/12/2020 CLINICAL DATA:  Provided history: Syncope x2 with head injury, head trauma, headache. Neck trauma, uncomplicated. Additional provided: Fall. EXAM: CT HEAD WITHOUT CONTRAST CT  CERVICAL SPINE WITHOUT CONTRAST TECHNIQUE: Multidetector CT imaging of the head and cervical spine was performed following the standard protocol without intravenous contrast. Multiplanar CT image reconstructions of the cervical spine were also generated. COMPARISON:  Head CT 09/29/2019, CT cervical spine 10/16/2018 FINDINGS: CT HEAD FINDINGS Brain: Mild generalized parenchymal atrophy. Mild ill-defined hypoattenuation within the cerebral white matter is nonspecific, but consistent with chronic small vessel ischemic disease. Redemonstrated prominent perivascular space versus small chronic lacunar infarct within the left basal ganglia (series 3, image 18). There is no acute intracranial hemorrhage. No demarcated cortical infarct. No extra-axial fluid collection. No evidence of intracranial mass. No midline shift. Vascular: No hyperdense vessel. Skull: Normal. Negative for fracture or focal lesion. Sinuses/Orbits: Visualized orbits show no acute finding. Mild ethmoid and right maxillary sinus mucosal thickening. No significant mastoid effusion. CT CERVICAL SPINE FINDINGS Alignment: Mild reversal of the expected cervical lordosis. Mild C6-C7 grade 1 retrolisthesis. Skull base and vertebrae: The basion-dental and atlanto-dental intervals are maintained.No evidence of acute fracture to the cervical spine. Soft tissues and spinal canal: No prevertebral  fluid or swelling. No visible canal hematoma. Disc levels: Cervical spondylosis without high-grade bony spinal canal narrowing. Upper chest: No consolidation within the imaged lung apices. No visible pneumothorax. IMPRESSION: CT head: 1. No evidence of acute intracranial abnormality. 2. Stable mild generalized parenchymal atrophy and chronic small vessel ischemic disease. 3. Mild ethmoid and right maxillary sinus mucosal thickening. CT cervical spine: 1. No evidence of acute fracture to the cervical spine. 2. Mild C6-C7 grade 1 retrolisthesis. 3. Cervical spondylosis without  high-grade bony spinal canal narrowing. Electronically Signed   By: Kellie Simmering DO   On: 06/12/2020 19:27   CT Cervical Spine Wo Contrast  Result Date: 06/12/2020 CLINICAL DATA:  Provided history: Syncope x2 with head injury, head trauma, headache. Neck trauma, uncomplicated. Additional provided: Fall. EXAM: CT HEAD WITHOUT CONTRAST CT CERVICAL SPINE WITHOUT CONTRAST TECHNIQUE: Multidetector CT imaging of the head and cervical spine was performed following the standard protocol without intravenous contrast. Multiplanar CT image reconstructions of the cervical spine were also generated. COMPARISON:  Head CT 09/29/2019, CT cervical spine 10/16/2018 FINDINGS: CT HEAD FINDINGS Brain: Mild generalized parenchymal atrophy. Mild ill-defined hypoattenuation within the cerebral white matter is nonspecific, but consistent with chronic small vessel ischemic disease. Redemonstrated prominent perivascular space versus small chronic lacunar infarct within the left basal ganglia (series 3, image 18). There is no acute intracranial hemorrhage. No demarcated cortical infarct. No extra-axial fluid collection. No evidence of intracranial mass. No midline shift. Vascular: No hyperdense vessel. Skull: Normal. Negative for fracture or focal lesion. Sinuses/Orbits: Visualized orbits show no acute finding. Mild ethmoid and right maxillary sinus mucosal thickening. No significant mastoid effusion. CT CERVICAL SPINE FINDINGS Alignment: Mild reversal of the expected cervical lordosis. Mild C6-C7 grade 1 retrolisthesis. Skull base and vertebrae: The basion-dental and atlanto-dental intervals are maintained.No evidence of acute fracture to the cervical spine. Soft tissues and spinal canal: No prevertebral fluid or swelling. No visible canal hematoma. Disc levels: Cervical spondylosis without high-grade bony spinal canal narrowing. Upper chest: No consolidation within the imaged lung apices. No visible pneumothorax. IMPRESSION: CT head: 1.  No evidence of acute intracranial abnormality. 2. Stable mild generalized parenchymal atrophy and chronic small vessel ischemic disease. 3. Mild ethmoid and right maxillary sinus mucosal thickening. CT cervical spine: 1. No evidence of acute fracture to the cervical spine. 2. Mild C6-C7 grade 1 retrolisthesis. 3. Cervical spondylosis without high-grade bony spinal canal narrowing. Electronically Signed   By: Kellie Simmering DO   On: 06/12/2020 19:27   DG Chest Port 1 View  Result Date: 06/12/2020 CLINICAL DATA:  Syncope and cough for 3 weeks. EXAM: PORTABLE CHEST 1 VIEW COMPARISON:  12/03/2019 CT chest and chest radiograph. FINDINGS: Single lead left chest pacing device. Previously visualized left mid lung nodular opacity is partially obscured by overlying pacing device. Linear bibasilar opacities are new. No pneumothorax or pleural effusion. Cardiomegaly, unchanged.  No acute osseous abnormalities. IMPRESSION: New linear bibasilar opacities. Differential includes atelectasis, less likely infectious/inflammatory foci. Cardiomegaly. Electronically Signed   By: Primitivo Gauze M.D.   On: 06/12/2020 17:40    Procedures Procedures (including critical care time)  Medications Ordered in ED Medications  magnesium sulfate IVPB 2 g 50 mL (2 g Intravenous New Bag/Given 06/12/20 1954)  sodium chloride flush (NS) 0.9 % injection 3 mL (3 mLs Intravenous Given 06/12/20 1622)  fentaNYL (SUBLIMAZE) injection 50 mcg (50 mcg Intravenous Given 06/12/20 1723)  insulin aspart (novoLOG) injection 12 Units (12 Units Subcutaneous Given 06/12/20 1954)    ED Course  I have reviewed the triage vital signs and the nursing notes.  Pertinent labs & imaging results that were available during my care of the patient were reviewed by me and considered in my medical decision making (see chart for details).    MDM Rules/Calculators/A&P                         patient here for evaluation following syncopal event times two. He is at  his neurologic baseline on ED arrival. Imaging is negative for acute fracture or intracranial hemorrhage. Labs significant for hyperglycemia, hypomag. He was treated with insulin, magnesium supplementation.  medicine consulted for admission for observation for recurrent syncopal events..   [name] device was interrogated and there were no recorded events on his device. Final Clinical Impression(s) / ED Diagnoses Final diagnoses:  Syncope and collapse  Hypomagnesemia  Hyperglycemia    Rx / DC Orders ED Discharge Orders    None       Quintella Reichert, MD 06/12/20 2030

## 2020-06-12 NOTE — H&P (Addendum)
[facility] Admission History and Physical Service Pager: [phone]  Patient name: Jeffrey [name] Medical record number: [id number] Date of birth: Apr 02, 1966 Age: 54 y.o. Gender: male  Primary Care Provider: Charlott Rakes, MD Consultants: none Code Status: full Preferred Emergency Contact: [phone] Jeffrey Pong (fiancee) [phone] Jeffrey [name] (sister)  Chief Complaint: syncope  Assessment and Plan: Jeffrey [name] is a 54 y.o. male presenting with syncope. PMH is significant for poorly controlled T2DM, HTN, diabetic neuropathy, right toe amputations, iron-deficiency anemia, CHF, NICM, CAD, HLD, syncope, ICD  Syncope- 2 occurences in the past week. fiancee states that patient has similar episodes in the past when his diabetes really gets out of hand. CT negative for acute intracranial abnormality or acute fracture of the cervical spine.  Does have stable mild generalized parenchymal atrophy and chronic small vessel ischemic disease, mild ethmoid and right maxillary sinus mucosal thickening, cervical spondylosis without high-grade bony spinal canal narrowing on CT.Unlikely seizure with no history, no shaking witnessed, no tongue biting or incontinence. No focal findings on exam to suggest stroke. Patient complains of headache. Received fentanyl in ED just prior to our exam which limited his alertness but was able to follow commands and answer questions appropriately. ICD reviewed and without arrhythmias. EKG shows NSR with abnormal ST changes which are unchanged from previous.Vital signs are stable with hypertension. Metabolic contribution likely from hyperglycemia and hyponatremia (corrected to ~128).  Suspect that these episodes are vasovagal vs orthostatic. Patient is hypovolemic on exam due to poor PO intake and poorly controlled DM.   - admit to [facility]PTS, attending Dr. Andria Frames - orthostatic vital signs - fall precautions - IV maintenance  fluids - BMP am for electrolyte reassessment - echo - ambulate with pulse ox -Frequent neuro checks, reassess after fentanyl has worn off  Insulin dependent T2DM-  Most recent A1c 04/2019 was 9.9, 15.4 today likely due to non-adherence with medications. CBGs on admission as high as 464. K+ 4.1, anion gap 8.  Given 12u novolog in ED. Home meds include 45u BID lantus and sliding scale novolog with meals which patient takes only when he feels really bad despite family influences. Does not follow regularly with PCP.  - lantus 20u am - sSSI - CBGs AC and HS - BMP am  - maintenance IV fluids NS with 96mEq K+ - diabetes coordinator consult - repeat hgb A1c -Replete potassium as needed -Gabapentin home dose - encourage/educate lifestyle changes - consider adding metformin, SGLT-2, etc if patient will agree to take for secondary benefits  AKI/nephrotic syndrome- creatinine 1.48 on admission.  Difficult to assess for baseline but probably around 1.2.  BUN/creatinine ratio 9.5 indicating intrarenal/glomerular etiology.  Urinalysis positive for greater than 500 glucose, moderate hemoglobin, greater than 300 protein, large leukocytes, greater than 50 white blood cells, rare bacteria. Negative for signs of peripheral edema on exam. 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 Renal US normal- no hydronephrosis. -IV fluids as above - strict I/O -nephrology consult am for possible biopsy  HFrEF, NICM- ICD implanted 01/2018. Last echo showed EF 20%, moderately dilated left atrium, and mildly dilated right ventricle.  Systolic function moderate to severely reduced. Home medications:  - holding lasix for aki and no hypervolemia on exam - daily weights - strict I/O  HTN- BP elevated on admission to 135-167/82-99. Home meds: coreg 12.5mg , entresto 49-51mg , lasix 60mg  daily, spironolactone 25mg . Patient non-adherent. - continue coreg as above  Iron deficiency anemia- hemoglobin 12.9, MCV 79.9. home meds: iron 325 mg daily.  Not adherent with iron supplement. Appears to be at baseline and stable - continue home iron - CBC am  HLD- home meds: rosuvastatin 40mg . Last lipid panel 04/2019 showed total cholesterol 137, HDL 36, LDL 57 - continue statin - lipid panel am  FEN/GI: Full diet Prophylaxis: Lovenox 40 subcu  Disposition: MedSurg, pending treatment for DM  History of Present Illness:  Jeffrey [name] is a 55 y.o. male presenting with syncope.  Jeffrey [name] has an extensive past medical history including uncontrolled type 2 diabetes, coronary artery disease, nonischemic cardiomyopathy (status post ICD defibrillator in place), hyperlipidemia, hypertension, and iron deficiency anemia.  He presented today with 2 episodes of syncope.  A couple days ago, he fell in his kitchen.  He remembers falling, but does not remember well if he was dizzy, had chest pain, or any other symptoms leading up to his syncopal episode.  His fiance reports he has been drinking a lot of fluids and has had episodes of polyuria with incontinence. No fever, chills, GI symptoms.  He is having neck and back pain after this fall in his kitchen.  His fiance says that he is slower than normal since his fall.  She called EMS today, who found Jeffrey [name] with fingerstick blood sugar over 500 and elevated blood pressures.  Initial glucose in the ED was 446, lowered to 334 after 12 units of NovoLog.  It has been several months since he has seen his PCP, and he historically has difficulties taking his medications.  He rarely takes insulin, only taking it if he feels "really bad" despite pressure from fianc and family.  Last time he took his insulin was 2 days ago.  He ate a small amount this morning.  His fiance reports his current presentation is congruent with previous episodes of hypoglycemia.  In late November or early December 2020, he OD on his insulin medications and required hospitalization.  The last few weeks he has been having a bad  cough, his fiance believes he is drinking too many fluids causing him to cough.   Review Of Systems: Per HPI with the following additions:  Review of Systems  Constitutional: Negative for chills and fever.  Respiratory: Positive for cough. Negative for hemoptysis and shortness of breath.   Cardiovascular: Negative for chest pain and orthopnea.  Musculoskeletal: Positive for back pain, falls and neck pain.  Neurological: Negative for dizziness, sensory change, speech change, seizures and weakness.   Review of Systems  Constitutional: Negative for chills and fever.  Respiratory: Positive for cough. Negative for hemoptysis and shortness of breath.   Cardiovascular: Negative for chest pain and orthopnea.  Musculoskeletal: Positive for back pain, falls and neck pain.  Neurological: Negative for dizziness, sensory change, speech change, seizures and weakness.    Patient Active Problem List   Diagnosis Date Noted  . Hyperglycemia due to diabetes mellitus ([place]) 06/12/2020  . Hypoglycemia 10/16/2018  . Acute metabolic encephalopathy 79/15/0569  . S/P ICD (internal cardiac defibrillator) procedure 01/19/2018  . Cardiac device in situ   . Syncope 01/13/2018  . NICM (nonischemic cardiomyopathy) ([place]) 02/15/2017  . Coronary artery disease involving native coronary artery of native heart without angina pectoris 02/15/2017  . Hyperlipidemia 02/15/2017  . Chronic combined systolic (congestive) and diastolic (congestive) heart failure ([place]) 01/28/2017  . Folliculitis 79/48/0165  . 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 ([place]) 53/74/8270  . Anemia, iron deficiency   . Noncompliance with medication regimen 03/30/2016  . Uncontrolled type 2 diabetes  mellitus with hyperglycemia, with long-term current use of insulin ([place])   . Cellulitis 09/25/2015  . Essential hypertension 08/21/2013  . Diabetic neuropathy ([place]) 08/21/2013  . Fungal toenail infection 08/21/2013    Past Medical  History: Past Medical History:  Diagnosis Date  . CAD (coronary artery disease)    a. cath 01/31/17: 60% 1st RPLB, 60% dist RCA, 55% prox RCA, 10% pro LAD --> Rx TX.   Marland Kitchen Chronic systolic CHF (congestive heart failure) ([place]) 01/28/2017   1. Echo 01/29/17:  EF 20-25, normal wall motion, mild LAE // 2. EF 10-15 by [employer] 01/2017   . Diabetes mellitus    type II  . Diabetic foot infection ([place]) 03/2016   RT FOOT  . Dyspnea   . History of kidney stones    passed  . HTN (hypertension)   . Hyperlipidemia   . NICM (nonischemic cardiomyopathy) ([place]) 02/15/2017   1. Mod non-obs CAD on LHC in 01/2017 - CAD does not explain cardiomyopathy    Past Surgical History: Past Surgical History:  Procedure Laterality Date  . AMPUTATION Right 04/01/2016   Procedure: Right Great Toe Amputation;  Surgeon: Newt Minion, MD;  Location: [facility];  Service: Orthopedics;  Laterality: Right;  . AMPUTATION Right 06/19/2016   Procedure: AMPUTATION SECOND TOE;  Surgeon: Marybelle Killings, MD;  Location: [facility];  Service: Orthopedics;  Laterality: Right;  . AMPUTATION TOE Right   . BACK SURGERY     for abscess  . ICD IMPLANT N/A 01/15/2018   Procedure: ICD IMPLANT;  Surgeon: Deboraha Sprang, MD;  Location: [facility];  Service: Cardiovascular;  Laterality: N/A;  . RIGHT/LEFT HEART CATH AND CORONARY ANGIOGRAPHY N/A 01/31/2017   Procedure: Right/Left Heart Cath and Coronary Angiography;  Surgeon: Troy Sine, MD;  Location: [place] [facility] [facility];  Service: Cardiovascular;  Laterality: N/A;    Social History: Social History   Tobacco Use  . Smoking status: Never Smoker  . Smokeless tobacco: Never Used  Vaping Use  . Vaping Use: Never used  Substance Use Topics  . Alcohol use: No  . Drug use: No   Family History: Family History  Problem Relation Age of Onset  . Diabetes Mother   . Hypertension Mother   . Diabetes Father   . Heart attack Father   . Diabetes Sister   . Heart attack Maternal Grandmother    Allergies  and Medications: No Known Allergies No current facility-administered medications on file prior to encounter.   Current Outpatient Medications on File Prior to Encounter  Medication Sig Dispense Refill  . albuterol (PROVENTIL HFA;VENTOLIN HFA) 108 (90 Base) MCG/ACT inhaler Inhale 1-2 puffs into the lungs every 6 (six) hours as needed for wheezing or shortness of breath. 1 Inhaler 0  . Aspirin-Acetaminophen-Caffeine (GOODY HEADACHE PO) Take 2 packets by mouth as needed (for dental pain).     . carvedilol (COREG) 12.5 MG tablet Take 1 tablet (12.5 mg total) by mouth 2 (two) times daily with a meal. 60 tablet 3  . cyclobenzaprine (FLEXERIL) 5 MG tablet Take 1 tablet (5 mg total) by mouth at bedtime as needed for muscle spasms. 30 tablet 0  . ENTRESTO 49-51 MG TAKE 1 TABLET BY MOUTH 2 TIMES DAILY. (Patient taking differently: Take 1 tablet by mouth 2 (two) times daily. ) 180 tablet 3  . ferrous sulfate 325 (65 FE) MG tablet Take 1 tablet (325 mg total) by mouth 2 (two) times daily with a meal. (Patient taking  differently: Take 325 mg by mouth daily. ) 60 tablet 3  . furosemide (LASIX) 40 MG tablet Take 1.5 tablets (60 mg total) by mouth daily. 45 tablet 3  . gabapentin (NEURONTIN) 400 MG capsule TAKE 1 CAPSULE BY MOUTH 3 TAKE TIMES DAILY. (Patient taking differently: Take 400 mg by mouth 3 (three) times daily. ) 90 capsule 3  . insulin aspart (NOVOLOG) 100 UNIT/ML injection Inject 0-15 Units into the skin 3 (three) times daily with meals. Sliding scale  CBG 70 - 120: 0 units: CBG 121 - 140: 2 units; CBG 140 - 200: 4 units; CBG 201 - 240: 6 units; CBG 240 - 300: 8 units;CBG 300 - 350: 12 units; CBG 351 - 400: 16 units; CBG > 400 : 16 units and notify your  MD (Patient taking differently: Inject 0-16 Units into the skin 3 (three) times daily with meals. Inject 0-16 units into the skin 3 times a day with meals, per sliding scale: BGL 70-120 = use nothing; 121-140= 2 units; 140-200 = 4 units; 201- 240 = 6  units; 240-300 = 8 units; 300-350 = 12 units; 351-400 = 16 units;  > 400 = 16 units and notify your MD) 10 mL 3  . Insulin Glargine (LANTUS SOLOSTAR) 100 UNIT/ML Solostar Pen Inject 45 Units into the skin 2 (two) times daily. (Patient taking differently: Inject 45 Units into the skin 2 (two) times daily before a meal. ) 15 mL 3  . ondansetron (ZOFRAN) 4 MG tablet Take 1 tablet (4 mg total) by mouth every 6 (six) hours as needed for nausea or vomiting. 12 tablet 0  . potassium chloride (KLOR-CON) 10 MEQ tablet Take 1 tablet (10 mEq total) by mouth daily. 30 tablet 3  . rosuvastatin (CRESTOR) 40 MG tablet Take 1 tablet (40 mg total) by mouth daily. 30 tablet 3  . spironolactone (ALDACTONE) 25 MG tablet Take 1 tablet (25 mg total) by mouth daily. 30 tablet 3  . aspirin EC 81 MG EC tablet Take 1 tablet (81 mg total) by mouth daily. (Patient not taking: Reported on 06/12/2020) 30 tablet 0  . Blood Glucose Monitoring Suppl (TRUE METRIX METER) DEVI 1 each by Does not apply route 3 (three) times daily before meals. 1 Device 0  . cetirizine (ZYRTEC) 10 MG tablet Take 1 tablet (10 mg total) by mouth daily. (Patient taking differently: Take 10 mg by mouth daily as needed for allergies or rhinitis. ) 30 tablet 1  . glucose blood (TRUE METRIX BLOOD GLUCOSE TEST) test strip Use 3 times daily before meals 100 each 12  . traZODone (DESYREL) 100 MG tablet Take 1 tablet (100 mg total) by mouth at bedtime. For sleep (Patient not taking: Reported on 06/12/2020) 30 tablet 2  . TRUEPLUS LANCETS 28G MISC 1 each by Does not apply route 3 (three) times daily before meals. 100 each 12  . TRUEPLUS PEN NEEDLES 32G X 4 MM MISC USE TO INJECT LANTUS DAILY. MUST USE NEW PEN NEEDLE WITH EACH INJECTION. (Patient taking differently: as directed. ) 100 each 0    Objective: BP (!) 146/98   Pulse 75   Temp 98.8 F (37.1 C) (Oral)   Resp 18   Ht 5\' 8"  (1.727 m)   Wt 95.3 kg   SpO2 94%   BMI 31.93 kg/m   Physical  Exam Constitutional:      Appearance: He is ill-appearing and diaphoretic. He is not toxic-appearing.  HENT:     Head: Normocephalic and atraumatic.  Mouth/Throat:     Mouth: Mucous membranes are moist.     Pharynx: Oropharynx is clear.  Cardiovascular:     Rate and Rhythm: Normal rate and regular rhythm.     Pulses: Normal pulses.     Heart sounds: Normal heart sounds.  Pulmonary:     Effort: Pulmonary effort is normal. No accessory muscle usage or respiratory distress.     Breath sounds: Normal breath sounds. No wheezing or rhonchi.  Chest:     Chest wall: No lacerations or deformity.  Abdominal:     General: Bowel sounds are normal.     Palpations: Abdomen is soft. There is no mass.     Tenderness: There is no abdominal tenderness. There is no guarding or rebound.  Musculoskeletal:     Cervical back: No tenderness.     Right lower leg: No edema.     Left lower leg: No edema.  Feet:     Comments: Missing right great toe and second toe. Skin:    General: Skin is warm.  Neurological:     Comments: Initially a little difficult to awaken d/t fentanyl in ED. Once awake, was able to discuss symptoms leading to ER visit. Alert enough at end to joke.      Labs and Imaging: CBC BMET  Recent Labs  Lab 06/13/20 0027  WBC 6.2  HGB 12.9*  HCT 41.7  PLT 261   Recent Labs  Lab 06/13/20 0027  NA 137  K 3.1*  CL 101  CO2 27  BUN 14  CREATININE 1.24  GLUCOSE 217*  CALCIUM 9.3     EKG: Normal sinus rhythm, normal axis. No ST elevation or BBB.    Ezequiel Essex, MD 06/13/2020, 1:48 AM PGY-1, [facility] Intern pager: [phone], text pages welcome  [place]    I have seen and examined this patient.     I have discussed the findings and exam with the intern and agree with the above note, which I have edited appropriately in [place]. I helped develop the management plan that is described in the resident's note, and I agree with the  content.   Doristine Mango, DO PGY-3 Family Medicine Resident

## 2020-06-13 ENCOUNTER — Inpatient Hospital Stay (HOSPITAL_COMMUNITY): Payer: Medicaid Other

## 2020-06-13 ENCOUNTER — Encounter (HOSPITAL_COMMUNITY): Payer: Self-pay | Admitting: Student in an Organized Health Care Education/Training Program

## 2020-06-13 DIAGNOSIS — R55 Syncope and collapse: Secondary | ICD-10-CM

## 2020-06-13 DIAGNOSIS — R801 Persistent proteinuria, unspecified: Secondary | ICD-10-CM

## 2020-06-13 DIAGNOSIS — R739 Hyperglycemia, unspecified: Secondary | ICD-10-CM

## 2020-06-13 LAB — BASIC METABOLIC PANEL
Anion gap: 9 (ref 5–15)
BUN: 14 mg/dL (ref 6–20)
CO2: 27 mmol/L (ref 22–32)
Calcium: 9.3 mg/dL (ref 8.9–10.3)
Chloride: 101 mmol/L (ref 98–111)
Creatinine, Ser: 1.24 mg/dL (ref 0.61–1.24)
GFR calc Af Amer: >60 mL/min (ref >60)
GFR calc non Af Amer: >60 mL/min (ref >60)
Glucose, Bld: 217 mg/dL — ABNORMAL HIGH (ref 70–99)
Potassium: 3.1 mmol/L — ABNORMAL LOW (ref 3.5–5.1)
Sodium: 137 mmol/L (ref 135–145)

## 2020-06-13 LAB — URINALYSIS, ROUTINE W REFLEX MICROSCOPIC
Bacteria, UA: NONE SEEN
Bilirubin Urine: NEGATIVE
Glucose, UA: >=500 mg/dL — AB
Ketones, ur: NEGATIVE mg/dL
Nitrite: NEGATIVE
Protein, ur: >=300 mg/dL — AB
Specific Gravity, Urine: 1.026 (ref 1.005–1.030)
pH: 5 (ref 5.0–8.0)

## 2020-06-13 LAB — PROTEIN / CREATININE RATIO, URINE
Creatinine, Urine: 281.91 mg/dL
Protein Creatinine Ratio: 1.68 mg/mg{Cre} — ABNORMAL HIGH (ref 0.00–0.15)
Total Protein, Urine: 474 mg/dL

## 2020-06-13 LAB — CBC
HCT: 41.7 % (ref 39.0–52.0)
Hemoglobin: 12.9 g/dL — ABNORMAL LOW (ref 13.0–17.0)
MCH: 25 pg — ABNORMAL LOW (ref 26.0–34.0)
MCHC: 30.9 g/dL (ref 30.0–36.0)
MCV: 80.7 fL (ref 80.0–100.0)
Platelets: 261 10*3/uL (ref 150–400)
RBC: 5.17 MIL/uL (ref 4.22–5.81)
RDW: 14.1 % (ref 11.5–15.5)
WBC: 6.2 10*3/uL (ref 4.0–10.5)
nRBC: 0 % (ref 0.0–0.2)

## 2020-06-13 LAB — HEMOGLOBIN A1C
Hgb A1c MFr Bld: 15.4 % — ABNORMAL HIGH (ref 4.8–5.6)
Mean Plasma Glucose: 395.28 mg/dL

## 2020-06-13 LAB — LIPID PANEL
Cholesterol: 145 mg/dL (ref 0–200)
HDL: 43 mg/dL (ref >40)
LDL Cholesterol: 69 mg/dL (ref 0–99)
Total CHOL/HDL Ratio: 3.4 RATIO
Triglycerides: 167 mg/dL — ABNORMAL HIGH (ref <150)
VLDL: 33 mg/dL (ref 0–40)

## 2020-06-13 LAB — GLUCOSE, CAPILLARY
Glucose-Capillary: 241 mg/dL — ABNORMAL HIGH (ref 70–99)
Glucose-Capillary: 342 mg/dL — ABNORMAL HIGH (ref 70–99)
Glucose-Capillary: 352 mg/dL — ABNORMAL HIGH (ref 70–99)
Glucose-Capillary: 388 mg/dL — ABNORMAL HIGH (ref 70–99)

## 2020-06-13 LAB — ECHOCARDIOGRAM COMPLETE
Height: 68 in
Weight: 3153.46 oz

## 2020-06-13 LAB — HIV ANTIBODY (ROUTINE TESTING W REFLEX): HIV Screen 4th Generation wRfx: NONREACTIVE

## 2020-06-13 LAB — CBG MONITORING, ED: Glucose-Capillary: 189 mg/dL — ABNORMAL HIGH (ref 70–99)

## 2020-06-13 MED ORDER — LIVING WELL WITH DIABETES BOOK
Freq: Once | Status: AC
  Filled 2020-06-13: qty 1

## 2020-06-13 MED ORDER — INSULIN ASPART 100 UNIT/ML ~~LOC~~ SOLN
0.0000 [IU] | Freq: Every day | SUBCUTANEOUS | Status: DC
  Administered 2020-06-13: 5 [IU] via SUBCUTANEOUS
  Administered 2020-06-14: 2 [IU] via SUBCUTANEOUS
  Administered 2020-06-15: 3 [IU] via SUBCUTANEOUS

## 2020-06-13 MED ORDER — INSULIN ASPART 100 UNIT/ML ~~LOC~~ SOLN
0.0000 [IU] | Freq: Three times a day (TID) | SUBCUTANEOUS | Status: DC
  Administered 2020-06-14: 11 [IU] via SUBCUTANEOUS
  Administered 2020-06-14: 5 [IU] via SUBCUTANEOUS
  Administered 2020-06-14 – 2020-06-15 (×3): 11 [IU] via SUBCUTANEOUS
  Administered 2020-06-15: 3 [IU] via SUBCUTANEOUS
  Administered 2020-06-16: 11 [IU] via SUBCUTANEOUS
  Administered 2020-06-16: 5 [IU] via SUBCUTANEOUS

## 2020-06-13 MED ORDER — POTASSIUM CHLORIDE CRYS ER 20 MEQ PO TBCR
40.0000 meq | EXTENDED_RELEASE_TABLET | Freq: Two times a day (BID) | ORAL | Status: DC
  Administered 2020-06-13: 40 meq via ORAL
  Filled 2020-06-13: qty 2

## 2020-06-13 MED ORDER — ALBUTEROL SULFATE (2.5 MG/3ML) 0.083% IN NEBU
3.0000 mL | INHALATION_SOLUTION | Freq: Once | RESPIRATORY_TRACT | Status: AC
  Administered 2020-06-13: 3 mL via RESPIRATORY_TRACT
  Filled 2020-06-13: qty 3

## 2020-06-13 NOTE — Hospital Course (Addendum)
Jeffrey [name] is a 54 y.o. male presenting with syncope. 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 Pt non-adherent to medications. Hospital course outlined by problem below. Please see the H&P for additional information.   Syncope Patient arrived to the ED s/p 2 syncopal episodes. BGL found to be 464. Patient c/o head, neck and back pain which he attributed to his syncopal episodes. He was given Tylenol for his pain.   Insulin Dependent T2DM Jeffrey [name] arrived to the ED hyperglycemic with a BGL of 464. Patient has a history of non-compliance with all of his medications.  He received 12 U Novolog in the ED, single dose of fentanyl, mIVF NaCl w/ KCL 56mEq, Mg in the ED. Patient repeat BGL at admission 217. A1c found to be 15.4 on admission, was 9.9 on 04/2019. IV maintenance fluids were discontinued due to h/o HFrEF and no signs that patient was dry.Home medications include 45U BID lantus and sliding scale novolog with meals. Patient initially started on 20U of Lantus on admission. Morning glucose on day 2 was 363 so another 20U was ordered.  Placed on regular diet to monitor patient glucose on his normal outpatient diet.  AKI Initially admitted for AKI with creatinine of 1.48. Repeat labs show patient in the range of 1.24-1.4. This likely to be baseline. UA significant for 474 protein, Protein/Cr ratio of 1.68.  Cough Patient reported 1 week of cough. CXR on admission significant for new linear opacities suggestive of atelectasis vs less likely infectious/inflammatory foci. Pt with known history of CHF but no signs of fluid overload. Incentive spirometry and Tessalon Perles ordered. Patient was noted to be SOB at times and received 1 albuterol tx to which he responded____.***  Neck Pain Patient c/o left sided neck pain s/p syncopal episodes. CT Head was negative for  fractures, significant for mild C6-7 grade 1 retrolisthesis and cervical spondylosis without high-grade bony spinal canal narrowing. Patient received Baclofen 5TID with *** relief   HTN Patient with history of hypertension. Patients home medications include 12.5 Coreg BID, 49-51mg  Entresto, 60mg  Lasix, 25mg  Spironolactone. Patient non-adherent to medications. Elevated BP throughout admission ranging *** Coreg continued as above.    Continued patient home mediations for hyperlipidemia, CHF, neuropathy, and iron deficiency anemia.  Follow up: ACE-I/ARB outpatient. Check BMP Follow up with PCP for diabetes medication adjustment, consider addition of metformin or SGLT-2.  Would advise against taking goody powder.

## 2020-06-13 NOTE — Progress Notes (Signed)
Family Medicine Teaching Service Daily Progress Note Intern Pager: [phone]  Patient name: Jeffrey [name] Medical record number: [id number] Date of birth: 1966-03-08 Age: 54 y.o. Gender: male  Primary Care Provider: Charlott Rakes, MD Consultants: Nephrology Code Status: FULL  Pt Overview and Major Events to Date:  7/2- Patient admitted after 2 syncopal- like episodes in the past week, diagnosed with Hyperglycemia  Assessment and Plan: Jeffrey [name] is a 54 y.o. male presenting with syncope. PMH is significant for poorly controlled T2DM, HTN, diabetic neuropathy, right toe amputations, iron-deficiency anemia, CHF, NICM, CAD, HLD, syncope, ICD  Syncope- 2 occurences in the past week. fiancee states that patient has similar episodes in the past when his diabetes really gets out of hand. Patient remains hyperglycemic and reports that he has not been taking his medication regularly. CT negative for acute intracranial abnormality or acute fracture of the cervical spine.  Does have stable mild generalized parenchymal atrophy and chronic small vessel ischemic disease, mild ethmoid and right maxillary sinus mucosal thickening, cervical spondylosis without high-grade bony spinal canal narrowing on CT. Unlikely seizure with no history, no shaking witnessed, no tongue biting or incontinence. Patient was reassessed for mentation and is oriented to person, place, time, and situation. Patient was able to spell WORLD backwards, could not complete serial 7s, although he did not seem to be a fan of math. - orthostatic vital signs - fall precautions - Discontinue IV maintenance fluids - BMP am for electrolyte reassessment - echo - ambulate with pulse ox -Frequent neuro checks  Insulin dependent T2DM-  Most recent A1c 04/2019 was 9.9, 15.4 at admission likely due to non-adherence with medications.  Given 12u novolog in ED repeat CBG 395 after 12 U with Anion gap of 9. . Home meds include 45u BID lantus  and sliding scale novolog with meals which patient takes only when he feels really bad despite family influences. Patient is on a Full diet, we would like to formulate treatment regimen on the diet he is most likely to follow in outpatient.  Does not follow regularly with PCP.  - lantus 20u am - sSSI - CBGs AC and HS - BMP am, continue to trend -Discontinue maintenance IV fluids NS with 16mEq K+ - diabetes coordinator consult -Replete potassium as needed -Gabapentin home dose - encourage/educate lifestyle changes - consider adding metformin, SGLT-2, etc if patient will agree to take for secondary benefits  Cough Patient reports that he has a had a cough and is noticeable SOB at the end of exam. Patient received CXR at admission was found to have new linear opacities suggestive of atelectasis vs less likely infectious/inflammatory foci. Patient also has known hx of CHF; however patient does not have peripheral edema no fluid excess on the lungs at this time. - albuterol one time  AKI- Repeat Cr. 1.24 which is likely his baseline.  BUN/creatinine ratio 11.29.  Urinalysis positive for greater than 500 glucose, moderate hemoglobin, greater than 300 protein, large leukocytes, greater than 50 white blood cells, rare bacteria. Negative for signs of peripheral edema on exam.  -Renal US normal- no hydronephrosis. - Discontinue IV fluids as above - repeat UA - Protein/Cr ratio - strict I/O   HFrEF, NICM- ICD implanted 01/2018. Last echo showed EF 20%, moderately dilated left atrium, and mildly dilated right ventricle.  Systolic function moderate to severely reduced. Home medications:  - holding lasix for aki and no hypervolemia on exam - daily weights - strict I/O  HTN- BP elevated on  admission to 135-167/82-99. Home meds: coreg 12.5mg  BID, entresto 49-51mg , lasix 60mg  daily, spironolactone 25mg . Patient non-adherent. - continue coreg as above  Iron deficiency anemia- hemoglobin 12.9, MCV  79.9. home meds: iron 325 mg daily. Not adherent with iron supplement. Appears to be at baseline and stable - continue home iron   HLD- home meds: rosuvastatin 40mg . Last lipid panel 04/2019 showed total cholesterol 137, HDL 36, LDL 57 - continue statin    FEN/GI: Full PPx: Lovenox  Disposition: Inpatient  Subjective:  Patient reports that he did have LOC; however was not aware that this was likely due to hyperglycemia. Patient reports that he was not taking his medication as prescribed. He continues to report that he has sharp head, neck, and spine pain that he rates 7/10 subsequent to his falls from standing height. Patient also reports that he feels a "little SOB" but believes he may need rest. Patient reports that he has had a slight cough.   Objective: Temp:  [98.1 F (36.7 C)-99.3 F (37.4 C)] 98.1 F (36.7 C) (07/03 0705) Pulse Rate:  [71-84] 73 (07/03 0705) Resp:  [15-20] 18 (07/03 0705) BP: (135-167)/(82-99) 165/99 (07/03 0705) SpO2:  [90 %-100 %] 95 % (07/03 0705) Weight:  [89.4 kg-95.3 kg] 89.4 kg (07/03 0300) Physical Exam: General: Patient is resting in bed comfortably, by the end of exam however, patient appears to be a bit more SOB. Cardiovascular: Distant heart sounds Respiratory: Increased work of breathing, but no accessory muscle usage clear lung lobes to auscultation bilaterally Abdomen: Normoactive bowel sounds, no pain to palpation Extremities: No edema, distal pulses intact  Laboratory: Recent Labs  Lab 06/12/20 1621 06/12/20 1726 06/13/20 0027  WBC 6.3  --  6.2  HGB 11.7* 12.9* 12.9*  HCT 38.6* 38.0* 41.7  PLT 236  --  261   Recent Labs  Lab 06/12/20 1621 06/12/20 1726 06/12/20 1744 06/13/20 0027  NA 133* 136  --  137  K 4.1 4.1  --  3.1*  CL 99 96*  --  101  CO2 26  --   --  27  BUN 14 18  --  14  CREATININE 1.48* 1.40*  --  1.24  CALCIUM 8.8*  --   --  9.3  PROT  --   --  7.0  --   BILITOT  --   --  0.5  --   ALKPHOS  --   --  86   --   ALT  --   --  16  --   AST  --   --  21  --   GLUCOSE 454* 464*  --  217*      Imaging/Diagnostic Tests: 7/1- XR Chest IMPRESSION: New linear bibasilar opacities. Differential includes atelectasis, less likely infectious/inflammatory foci.  Cardiomegaly.  7/1- CT Head wo contrast IMPRESSION: CT head:  1. No evidence of acute intracranial abnormality. 2. Stable mild generalized parenchymal atrophy and chronic small vessel ischemic disease. 3. Mild ethmoid and right maxillary sinus mucosal thickening.  7/1- CT Cervical spine wo contrast CT cervical spine:  1. No evidence of acute fracture to the cervical spine. 2. Mild C6-C7 grade 1 retrolisthesis. 3. Cervical spondylosis without high-grade bony spinal canal narrowing.  7/2- US Renal IMPRESSION: Normal renal ultrasound. No hydronephrosis or other significant finding.  Freida Busman, MD 06/13/2020, 7:34 AM PGY-1, [place] Intern pager: [phone], text pages welcome

## 2020-06-13 NOTE — Progress Notes (Signed)
  Echocardiogram 2D Echocardiogram has been performed.  Jeffrey [name] 06/13/2020, 2:28 PM

## 2020-06-13 NOTE — Progress Notes (Signed)
Inpatient Diabetes Program Recommendations  AACE/ADA: New Consensus Statement on Inpatient Glycemic Control (2015)  Target Ranges:  Prepandial:   less than 140 mg/dL      Peak postprandial:   less than 180 mg/dL (1-2 hours)      Critically ill patients:  140 - 180 mg/dL   Lab Results  Component Value Date   GLUCAP 342 (H) 06/13/2020   HGBA1C 15.4 (H) 06/13/2020    Review of Glycemic Control Results for Jeffrey, [name] (MRN [id number]) as of 06/13/2020 14:48  Ref. Range 06/13/2020 02:41 06/13/2020 07:03 06/13/2020 12:06  Glucose-Capillary Latest Ref Range: 70 - 99 mg/dL 189 (H) 241 (H) 342 (H)   Diabetes history:  DM2  Outpatient Diabetes medications:  Lantus 45 units bid  Novolog 0-15 units tid  Current orders for Inpatient glycemic control:  Lantus 20 units daily  Novolog 0-9 units tid  Inpatient Diabetes Program Recommendations:     Novolog 0-15 units tid Novolog meal coverage 3 units tid with meals if post prandials remain elevated  Note:  Spoke with patient on the phone.  Reviewed patient's current A1c of 15.4% (average blood sugar of 395 mg/dl). Explained what a A1c is and what it measures. Also reviewed goal A1c with patient, importance of good glucose control @ home, and blood sugar goals.  He states he has not been taking his medications as prescribed and misses multiple doses of insulin.  He states he checks his blood sugar 1-2 times a day and can't remember what the reading are.  He could not remember his lantus dose either.  Educated patient on long and short term complications of not getting his blood sugar under control.  He states he drinks [name] and does not watch his CHO intake.  Again reviewed important of following a diet that limits CHO to 60 grams of CHO per meal.  We reviewed CHO's and food that contain CHO's.  He states he knows he need to do better and will try.  Denies hypoglycemia;  Reviewed symptoms and treatment.  He could not tell me what a normal blood  sugar is.  He needs a lot of reinforcement in the hospital by staff.  Ordered LWWD booklet, RD consult and attached education to DC paperwork.   Will continue to follow while inpatient.  Thank you, Reche Dixon, RN, BSN Diabetes Coordinator Inpatient Diabetes Program [phone] (team pager from 8a-5p)   Will continue to follow while inpatient.  Thank you, Reche Dixon, RN, BSN Diabetes Coordinator Inpatient Diabetes Program [phone] (team pager from 8a-5p)

## 2020-06-14 DIAGNOSIS — N049 Nephrotic syndrome with unspecified morphologic changes: Secondary | ICD-10-CM

## 2020-06-14 LAB — BASIC METABOLIC PANEL
Anion gap: 8 (ref 5–15)
BUN: 17 mg/dL (ref 6–20)
CO2: 22 mmol/L (ref 22–32)
Calcium: 8.4 mg/dL — ABNORMAL LOW (ref 8.9–10.3)
Chloride: 105 mmol/L (ref 98–111)
Creatinine, Ser: 1.3 mg/dL — ABNORMAL HIGH (ref 0.61–1.24)
GFR calc Af Amer: >60 mL/min (ref >60)
GFR calc non Af Amer: >60 mL/min (ref >60)
Glucose, Bld: 363 mg/dL — ABNORMAL HIGH (ref 70–99)
Potassium: 4 mmol/L (ref 3.5–5.1)
Sodium: 135 mmol/L (ref 135–145)

## 2020-06-14 LAB — URINE CULTURE

## 2020-06-14 LAB — GLUCOSE, CAPILLARY
Glucose-Capillary: 335 mg/dL — ABNORMAL HIGH (ref 70–99)
Glucose-Capillary: 237 mg/dL — ABNORMAL HIGH (ref 70–99)
Glucose-Capillary: 302 mg/dL — ABNORMAL HIGH (ref 70–99)
Glucose-Capillary: 230 mg/dL — ABNORMAL HIGH (ref 70–99)

## 2020-06-14 MED ORDER — INSULIN GLARGINE 100 UNIT/ML ~~LOC~~ SOLN
20.0000 [IU] | Freq: Once | SUBCUTANEOUS | Status: AC
  Administered 2020-06-14: 20 [IU] via SUBCUTANEOUS
  Filled 2020-06-14: qty 0.2

## 2020-06-14 MED ORDER — INSULIN GLARGINE 100 UNIT/ML ~~LOC~~ SOLN
40.0000 [IU] | Freq: Every day | SUBCUTANEOUS | Status: DC
  Administered 2020-06-15: 40 [IU] via SUBCUTANEOUS
  Filled 2020-06-14: qty 0.4

## 2020-06-14 MED ORDER — BENZONATATE 100 MG PO CAPS
100.0000 mg | ORAL_CAPSULE | Freq: Two times a day (BID) | ORAL | Status: DC
  Administered 2020-06-14 – 2020-06-16 (×5): 100 mg via ORAL
  Filled 2020-06-14 (×5): qty 1

## 2020-06-14 MED ORDER — BACLOFEN 10 MG PO TABS
5.0000 mg | ORAL_TABLET | Freq: Three times a day (TID) | ORAL | Status: DC
  Administered 2020-06-14 – 2020-06-16 (×6): 5 mg via ORAL
  Filled 2020-06-14 (×6): qty 1

## 2020-06-14 NOTE — Progress Notes (Signed)
Inpatient Diabetes Program Recommendations  AACE/ADA: New Consensus Statement on Inpatient Glycemic Control (2015)  Target Ranges:  Prepandial:   less than 140 mg/dL      Peak postprandial:   less than 180 mg/dL (1-2 hours)      Critically ill patients:  140 - 180 mg/dL   Lab Results  Component Value Date   GLUCAP 335 (H) 06/14/2020   HGBA1C 15.4 (H) 06/13/2020    Review of Glycemic Control Results for THELMER, LEGLER (MRN [id number]) as of 06/14/2020 07:48  Ref. Range 06/13/2020 07:03 06/13/2020 12:06 06/13/2020 16:35 06/13/2020 21:38 06/14/2020 06:45  Glucose-Capillary Latest Ref Range: 70 - 99 mg/dL 241 (H) 342 (H) 352 (H) 388 (H) 335 (H)    Diabetes history:  DM2  Outpatient Diabetes medications:  Lantus 45 units bid  Novolog 0-15 units tid  Current orders for Inpatient glycemic control:  Lantus 20 units daily  Novolog 0-9 units tid  Inpatient Diabetes Program Recommendations:     -Lantus 20 units bid  -Novolog 3 units tid with meals if eats at least 50% and cbg >80 mg.dl  Will continue to follow while inpatient.  Thank you, Reche Dixon, RN, BSN Diabetes Coordinator Inpatient Diabetes Program [phone] (team pager from 8a-5p)

## 2020-06-14 NOTE — Progress Notes (Addendum)
Family Medicine Teaching Service Daily Progress Note Intern Pager: [phone]  Patient name: Jeffrey [name] Medical record number: [id number] Date of birth: [birth year] Age: 54 y.o. Gender: male  Primary Care Provider: Charlott Rakes, MD Consultants: None Code Status: FULL  Pt Overview and Major Events to Date:  Admitted 7/2- syncopal episodes and hyperglycemia  Assessment and Plan: Jeffrey Sassone Robersonis a 54 y.o.malepresenting with syncope. 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 Pt non-adherent to medications.  Syncope-2 occurences in the past week. Jeffrey [name] states that patient has similar episodes in the past when his diabetes really gets out of hand. Glucose >500 by EMS. Patient reports that he has not been taking his medication regularly. CTnegativefor acute intracranial abnormality or acute fracture of the cervical spine. Unlikely seizure with no history, no shaking witnessed, no tongue biting or incontinence. Echo significant for EF of 30-35%, global left ventricular hypokinesis and mitral valve regurgitation. - orthostatic vital signs - fall precautions - Discontinue IV maintenance fluids - AM BMP - ambulate with pulse ox  Insulin dependent T2DM- Most recent A1c 04/2019 was 9.9, 15.4 at admission likely due to non-adherence with medications.  Given 12u novolog in ED repeat CBG 395 after 12 U with Anion gap of 9. Glucose this morning at 363. Home meds include 45u BID lantus and sliding scale novolog with meals which patient Jeffrey [name] when he feels really bad despite family influences. Patient is on a Full diet, we would like to formulate treatment regimen on the diet he is most likely to follow outpatient.  Does not follow regularly with PCP.Diabetes coordinator is following patient. - Discussed importance of diabetes control, states he will do better  and is motivated to do so - Give another 20U of Lantus - Start 40U of Lantus tomorrow - sSSI - CBGs AC and HS - Start ACE-I - AM BMP - Discontinue maintenance IV fluids NS - Replete potassium as needed - Gabapentin home dose - Consider adding metformin, SGLT-2, etc if patient will agree to take for secondary benefits - Close outpatient follow up to follow his diabetes/possibly adjust medications  Cough Patient reports that he has a had a cough x1 week.  Patient received CXR at admission was found to have new linear opacities suggestive of atelectasis vs less likely infectious/inflammatory foci. Patient also has known hx of CHF; however patient does not have peripheral edema no fluid excess on the lungs at this time. -  Albuterol q6h PRN SOB - incentive spirometry - tessalon perles  AKI-Cr. 1.3 this AM, this is likely his baseline. BUN/creatinine ratio 13.07. Urinalysis positive for greater than 500 glucose, moderate hemoglobin, greater than 300 protein, large leukocytes, greater than 50 white blood cells, rare bacteria. No edema on exam.  - Renal US normal- no hydronephrosis. - Discontinue IV fluids as above - AM BMP - Urine Protein/Cr ratio - strict I/O  Neck Pain Patient notes pain to the left posterior side of his neck. Believes this occurred after syncopal episode. On palpation of neck, no signs of acute distress or discomfort. Seems to be more MSK in origin. CT Head on 7/2 significant for mild C6-7 grade 1 retrolisthesis and cervical spondylosis without high-grade bony spinal canal narrowing. Negative for fractures. - Baclofen 5 TID  HFrEF, NICM- ICD implanted 01/2018. Last echo showed EF20%, moderately dilated left atrium, and mildly dilated right ventricle. Most recent echo was a difficult study due to  poor window. Showed EF of 30-35%, global left ventricular hypokinesis and mitral regurgitation.  - holding lasix for aki and no hypervolemia on exam - daily weights -  strict I/O  HTN-BP elevated on admission to 135-167/82-99. Last 24 hours show range of (132/91)-(167/103). Home meds: coreg 12.91m BID, entresto 49-550m lasix 6033maily, spironolactone 27m66matient non-adherent. - continue coreg as above  Iron deficiency anemia-hemoglobin 12.9, MCV 80.7. home meds:iron 325 mg daily. Not adherent with iron supplement. Appears to be at baseline and stable - continue home iron  HLD-home meds: rosuvastatin 40mg28mst lipid panel 04/2019 showed total cholesterol 137, HDL 36, LDL 57 - continuestatin  FEN/GI: Full PPx: Lovenox  Disposition: Anticipate discharge possibly tomorrow  Subjective:  Patient states he is a little short of breath still, intermittently coughing during encounter. He also states that he continues to have left-sided neck pain, rates it a 7/10 which is improved from 10/10 on admission. Believes that this is from his syncopal episode. Notes limited range of motion and states he has trying to sleep on his right side. He met with the diabetes coordinator yesterday and tells me that he understands the importance of keeping his diabetes controlled. Pt states that his family is getting on him about it as well. When asked why he has not been adherent to his medication he says he is "lazy". He considers his girlfriend his caretaker and says she will make his follow up appointments for him. He denies any chest pain, abdominal pain, head pain.   Objective: Temp:  [98.1 F (36.7 C)-99.1 F (37.3 C)] 98.7 F (37.1 C) (07/04 0505) Pulse Rate:  [70-73] 73 (07/04 0505) Resp:  [16-20] 16 (07/04 0505) BP: (132-167)/(91-103) 167/103 (07/04 0505) SpO2:  [95 %-99 %] 96 % (07/04 0505) Weight:  [95.9 kg] 95.9 kg (07/03 2140) Physical Exam: General: awake, alert, no acute distress, [employer] cup at bedside Neck: supple, decreased active rotation of neck, palpation of neck without any signs of distress or pain Cardiovascular: RRR, no  murmurs Respiratory: Clear to auscultation, intermittently coughing throughout exam Abdomen: Several spots of hyperpigmentation across abdomen, normoactive BS, non-tender to palpation Extremities: 2+DP and radial pusles bilaterally, no edema b/l legs, amputated 1st and 2nd digit of right foot  Laboratory: Recent Labs  Lab 06/12/20 1621 06/12/20 1726 06/13/20 0027  WBC 6.3  --  6.2  HGB 11.7* 12.9* 12.9*  HCT 38.6* 38.0* 41.7  PLT 236  --  261   Recent Labs  Lab 06/12/20 1621 06/12/20 1621 06/12/20 1726 06/12/20 1744 06/13/20 0027 06/14/20 0326  NA 133*   < > 136  --  137 135  K 4.1   < > 4.1  --  3.1* 4.0  CL 99   < > 96*  --  101 105  CO2 26  --   --   --  27 22  BUN 14   < > 18  --  14 17  CREATININE 1.48*   < > 1.40*  --  1.24 1.30*  CALCIUM 8.8*  --   --   --  9.3 8.4*  PROT  --   --   --  7.0  --   --   BILITOT  --   --   --  0.5  --   --   ALKPHOS  --   --   --  86  --   --   ALT  --   --   --  16  --   --  AST  --   --   --  21  --   --   GLUCOSE 454*   < > 464*  --  217* 363*   < > = values in this interval not displayed.    Imaging/Diagnostic Tests: Echo Sonographer Comments: Technically difficult study due to poor echo  windows.  IMPRESSIONS  1. Left ventricular ejection fraction, by estimation, is 30 to 35%. The  left ventricle has moderate to severely decreased function. The left  ventricle demonstrates global hypokinesis. There is mild left ventricular  hypertrophy. Left ventricular  diastolic parameters are consistent with Grade I diastolic dysfunction  (impaired relaxation).  2. Right ventricular systolic function is normal. The right ventricular  size is normal. Tricuspid regurgitation signal is inadequate for assessing  PA pressure.  3. The mitral valve is normal in structure. Trivial mitral valve  regurgitation. No evidence of mitral stenosis.  4. The aortic valve is tricuspid. Aortic valve regurgitation is not  visualized. Mild aortic  valve sclerosis is present, with no evidence of  aortic valve stenosis.  5. The inferior vena cava is normal in size with greater than 50%  respiratory variability, suggesting right atrial pressure of 3 mmHg.   CT cervical spine: 1. No evidence of acute fracture to the cervical spine. 2. Mild C6-C7 grade 1 retrolisthesis. 3. Cervical spondylosis without high-grade bony spinal canal narrowing.  Sharion Settler, DO 06/14/2020, 6:24 AM PGY-1, [place] Intern pager: [phone], text pages welcome

## 2020-06-15 DIAGNOSIS — R55 Syncope and collapse: Secondary | ICD-10-CM

## 2020-06-15 DIAGNOSIS — E1165 Type 2 diabetes mellitus with hyperglycemia: Secondary | ICD-10-CM

## 2020-06-15 LAB — BASIC METABOLIC PANEL
Anion gap: 9 (ref 5–15)
BUN: 18 mg/dL (ref 6–20)
CO2: 24 mmol/L (ref 22–32)
Calcium: 8.8 mg/dL — ABNORMAL LOW (ref 8.9–10.3)
Chloride: 101 mmol/L (ref 98–111)
Creatinine, Ser: 1.43 mg/dL — ABNORMAL HIGH (ref 0.61–1.24)
GFR calc Af Amer: >60 mL/min (ref >60)
GFR calc non Af Amer: 55 mL/min — ABNORMAL LOW (ref >60)
Glucose, Bld: 316 mg/dL — ABNORMAL HIGH (ref 70–99)
Potassium: 4.1 mmol/L (ref 3.5–5.1)
Sodium: 134 mmol/L — ABNORMAL LOW (ref 135–145)

## 2020-06-15 LAB — CBC
HCT: 38.3 % — ABNORMAL LOW (ref 39.0–52.0)
Hemoglobin: 11.6 g/dL — ABNORMAL LOW (ref 13.0–17.0)
MCH: 24.5 pg — ABNORMAL LOW (ref 26.0–34.0)
MCHC: 30.3 g/dL (ref 30.0–36.0)
MCV: 80.8 fL (ref 80.0–100.0)
Platelets: 211 10*3/uL (ref 150–400)
RBC: 4.74 MIL/uL (ref 4.22–5.81)
RDW: 14.3 % (ref 11.5–15.5)
WBC: 5.8 10*3/uL (ref 4.0–10.5)
nRBC: 0 % (ref 0.0–0.2)

## 2020-06-15 LAB — GLUCOSE, CAPILLARY
Glucose-Capillary: 185 mg/dL — ABNORMAL HIGH (ref 70–99)
Glucose-Capillary: 316 mg/dL — ABNORMAL HIGH (ref 70–99)
Glucose-Capillary: 310 mg/dL — ABNORMAL HIGH (ref 70–99)
Glucose-Capillary: 252 mg/dL — ABNORMAL HIGH (ref 70–99)

## 2020-06-15 MED ORDER — SACUBITRIL-VALSARTAN 49-51 MG PO TABS
1.0000 | ORAL_TABLET | Freq: Two times a day (BID) | ORAL | Status: DC
  Administered 2020-06-15 – 2020-06-16 (×3): 1 via ORAL
  Filled 2020-06-15 (×4): qty 1

## 2020-06-15 MED ORDER — INSULIN GLARGINE 100 UNIT/ML ~~LOC~~ SOLN
10.0000 [IU] | Freq: Once | SUBCUTANEOUS | Status: AC
  Administered 2020-06-15: 10 [IU] via SUBCUTANEOUS
  Filled 2020-06-15: qty 0.1

## 2020-06-15 MED ORDER — INSULIN GLARGINE 100 UNIT/ML ~~LOC~~ SOLN
50.0000 [IU] | Freq: Every day | SUBCUTANEOUS | Status: DC
  Filled 2020-06-15: qty 0.5

## 2020-06-15 NOTE — Progress Notes (Signed)
Family Medicine Teaching Service Daily Progress Note Intern Pager: [phone]  Patient name: Jeffrey [name] Medical record number: [id number] Date of birth: [birth year] Age: 54 y.o. Gender: male  Primary Care Provider: Charlott Rakes, MD Consultants: None Code Status: Full  Pt Overview and Major Events to Date:  Patient is currently hospital day 3, admitted for syncope, found to have nonketotic hyperosmolar state and AKI.  Assessment and Plan: Jeffrey Rosenfield Robersonis a 54 y.o.malepresenting with syncope. 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 Pt non-adherent to medications.  Syncope  Admitted for 2 occurrences in the past week.  CT head negative.  Echo shows EF of 30 to 35%, with global left ventricular hypokinesis and mitral valve regurgitation.  Unlikely seizure due to no history, incongruent witnessed events. -Orthostatic vital signs -Fall precautions -Morning BMP shows creatinine 1.43, hemoglobin 11.6, glucose 316 -Ambulate with pulse ox  Non ketotic hyperosmolar state 2/2 insulin dependent T2DM Admission A1c 15.4, up from previous 9.20 Apr 2019.  Home diabetes meds include 45 units Lantus twice daily and sliding scale NovoLog with meals; however the patient only takes it "when he feels really bad".  -Diabetes coordinator on board -Morning BMP glucose 316, creatinine 1.43 -Allow regular diet to attempt to approximate insulin need at home -Lantus 50 units daily started today, previously 40 units daily -Consider starting ACE inhibitor today -CBG measured before meals and at bedtime -Maintenance IV fluids have been discontinued -Consider starting home gabapentin today, 400 mg p.o. 3 times daily -Close outpatient follow-up -Consider adding Metformin, SGLT-2, or others if patient will agree to take  AKI Creatinine 1.3 yesterday morning, BUN/creatinine  ratio 13.07. Urine protein to creatinine ratio 1.68.  Renal ultrasound normal. -Morning creatinine 1.43 -Continue strict I's and O's -Discontinued IV fluids, as noted above  Cough Patient reports that he has a had a cough x1 week.  Patient received CXR at admission was found to have new linear opacities suggestive of atelectasis vs less likely infectious/inflammatory foci. Patient also has known hx of CHF; however patient does not have peripheral edema no fluid excess on the lungs at this time. -  Albuterol q6h PRN SOB - incentive spirometry - tessalon perles  Neck Pain Patient notes pain to the left posterior side of his neck. Believes this occurred after syncopal episode. On palpation of neck, no signs of acute distress or discomfort. Seems to be more MSK in origin. CT Head on 7/2 significant for mild C6-7 grade 1 retrolisthesis and cervical spondylosis without high-grade bony spinal canal narrowing. Negative for fractures. - Baclofen 5 TID  HFrEF, NICM ICD implanted 01/2018. Last echo showed EF20%, moderately dilated left atrium, and mildly dilated right ventricle. Most recent echo was a difficult study due to poor window. Showed EF of 30-35%, global left ventricular hypokinesis and mitral regurgitation.  - holding lasix for aki and no hypervolemia on exam - daily weights - strict I/O -Plan to start Entresto and furosemide today, consider starting spironolactone tomorrow  HTN BP elevated on admission to 135-167/82-99. Last 24 hours show range of (132/91)-(167/103). Home meds: coreg 12.5mg BID, entresto 49-51mg , lasix 60mg  daily, spironolactone 25mg . Patient non-adherent. - continue coreg as above  Iron deficiency anemia Hemoglobin 12.9, MCV 80.7. home meds:iron 325 mg daily. Not adherent with iron supplement. Appears to be at baseline and stable - continue home iron  HLD Home meds: rosuvastatin 40mg . Last lipid panel 04/2019 showed total cholesterol 137,  HDL 36, LDL 57 -  continuestatin  FEN/GI:Full DVV:OHYWVPX  Disposition: Potential discharge today  Subjective:  Patient was found laying in bed this morning upon entering room.  He was awake, alert, and conversational.  He had a good night, slept well.  He has no complaints this morning.  When asked about shortness of breath, he reported that he was still a little short of breath, and that the albuterol provided minimal to moderate relief.  Objective: Temp:  [97.8 F (36.6 C)-98.8 F (37.1 C)] 98.4 F (36.9 C) (07/05 0502) Pulse Rate:  [71-84] 71 (07/05 0502) Resp:  [18] 18 (07/05 0502) BP: (141-156)/(91-99) 141/91 (07/05 0502) SpO2:  [96 %-97 %] 97 % (07/05 0502) Weight:  [103 kg] 103 kg (07/05 0502)  Physical Exam: General: Awake, alert, conversational Cardiovascular: Regular rate and rhythm, S1 and S2 auscultated, no murmurs or gallops appreciated Respiratory: Lungs clear to auscultation bilaterally, no wheezes noted Abdomen: Soft, bowel sounds present, no tenderness to palpation or rebound tenderness Extremities: No extremity edema  Laboratory: Recent Labs  Lab 06/12/20 1621 06/12/20 1621 06/12/20 1726 06/13/20 0027 06/15/20 0942  WBC 6.3  --   --  6.2 5.8  HGB 11.7*   < > 12.9* 12.9* 11.6*  HCT 38.6*   < > 38.0* 41.7 38.3*  PLT 236  --   --  261 211   < > = values in this interval not displayed.   Recent Labs  Lab 06/12/20 1726 06/12/20 1744 06/13/20 0027 06/14/20 0326 06/15/20 0942  NA   < >  --  137 135 134*  K   < >  --  3.1* 4.0 4.1  CL   < >  --  101 105 101  CO2   < >  --  27 22 24   BUN   < >  --  14 17 18   CREATININE   < >  --  1.24 1.30* 1.43*  CALCIUM   < >  --  9.3 8.4* 8.8*  PROT  --  7.0  --   --   --   BILITOT  --  0.5  --   --   --   ALKPHOS  --  86  --   --   --   ALT  --  16  --   --   --   AST  --  21  --   --   --   GLUCOSE   < >  --  217* 363* 316*   < > = values in this interval not displayed.    Imaging/Diagnostic Tests: ECHOCARDIOGRAM  COMPLETE  Result Date: 06/13/2020    ECHOCARDIOGRAM REPORT   Patient Name:   Jeffrey [name] Date of Exam: 06/13/2020 Medical Rec #:  [id number]         Height:       68.0 in Accession #:    [id number]        Weight:       197.1 lb Date of Birth:  1966-06-28         BSA:          2.031 m Patient Age:    69 years          BP:           132/91 mmHg Patient Gender: M                 HR:  72 bpm. Exam Location:  Inpatient Procedure: 2D Echo, Color Doppler, Cardiac Doppler and Strain Analysis Indications:    R55 Syncope  History:        Patient has prior history of Echocardiogram examinations. CHF                 and Cardiomyopathy, CAD, Defibrillator, Signs/Symptoms:Syncope;                 Risk Factors:Hypertension, Diabetes and Dyslipidemia.  Sonographer:    Roseanna Rainbow RDCS Referring Phys: Francis Dowse Gwyndolyn Saxon A HENSEL  Sonographer Comments: Technically difficult study due to poor echo windows. IMPRESSIONS  1. Left ventricular ejection fraction, by estimation, is 30 to 35%. The left ventricle has moderate to severely decreased function. The left ventricle demonstrates global hypokinesis. There is mild left ventricular hypertrophy. Left ventricular diastolic parameters are consistent with Grade I diastolic dysfunction (impaired relaxation).  2. Right ventricular systolic function is normal. The right ventricular size is normal. Tricuspid regurgitation signal is inadequate for assessing PA pressure.  3. The mitral valve is normal in structure. Trivial mitral valve regurgitation. No evidence of mitral stenosis.  4. The aortic valve is tricuspid. Aortic valve regurgitation is not visualized. Mild aortic valve sclerosis is present, with no evidence of aortic valve stenosis.  5. The inferior vena cava is normal in size with greater than 50% respiratory variability, suggesting right atrial pressure of 3 mmHg. FINDINGS  Left Ventricle: Left ventricular ejection fraction, by estimation, is 30 to 35%. The left ventricle has  moderate to severely decreased function. The left ventricle demonstrates global hypokinesis. The left ventricular internal cavity size was normal in size. There is mild left ventricular hypertrophy. Left ventricular diastolic parameters are consistent with Grade I diastolic dysfunction (impaired relaxation). Right Ventricle: The right ventricular size is normal. Right ventricular systolic function is normal. Tricuspid regurgitation signal is inadequate for assessing PA pressure. The tricuspid regurgitant velocity is 2.13 m/s, and with an assumed right atrial  pressure of 3 mmHg, the estimated right ventricular systolic pressure is 06.3 mmHg. Left Atrium: Left atrial size was normal in size. Right Atrium: Right atrial size was normal in size. Pericardium: There is no evidence of pericardial effusion. Mitral Valve: The mitral valve is normal in structure. Normal mobility of the mitral valve leaflets. Trivial mitral valve regurgitation. No evidence of mitral valve stenosis. Tricuspid Valve: The tricuspid valve is normal in structure. Tricuspid valve regurgitation is trivial. No evidence of tricuspid stenosis. Aortic Valve: The aortic valve is tricuspid. Aortic valve regurgitation is not visualized. Mild aortic valve sclerosis is present, with no evidence of aortic valve stenosis. Pulmonic Valve: The pulmonic valve was normal in structure. Pulmonic valve regurgitation is not visualized. No evidence of pulmonic stenosis. Aorta: The aortic root is normal in size and structure. Venous: The inferior vena cava is normal in size with greater than 50% respiratory variability, suggesting right atrial pressure of 3 mmHg.  Additional Comments: A pacer wire is visualized.  LEFT VENTRICLE PLAX 2D LVIDd:         3.70 cm      Diastology LVIDs:         2.60 cm      LV e' lateral:   8.55 cm/s LV PW:         1.60 cm      LV E/e' lateral: 4.0 LV IVS:        1.30 cm      LV e' medial:    4.95 cm/s LVOT  diam:     2.30 cm      LV E/e'  medial:  6.8 LV SV:         74 LV SV Index:   36 LVOT Area:     4.15 cm  LV Volumes (MOD) LV vol d, MOD A2C: 139.0 ml LV vol d, MOD A4C: 110.0 ml LV vol s, MOD A2C: 63.3 ml LV vol s, MOD A4C: 59.4 ml LV SV MOD A2C:     75.7 ml LV SV MOD A4C:     110.0 ml LV SV MOD BP:      61.6 ml IVC IVC diam: 1.50 cm LEFT ATRIUM           Index       RIGHT ATRIUM           Index LA diam:      3.00 cm 1.48 cm/m  RA Area:     12.90 cm LA Vol (A2C): 56.5 ml 27.82 ml/m RA Volume:   30.00 ml  14.77 ml/m LA Vol (A4C): 25.7 ml 12.65 ml/m  AORTIC VALVE LVOT Vmax:   104.00 cm/s LVOT Vmean:  68.200 cm/s LVOT VTI:    0.177 m  AORTA Ao Root diam: 3.40 cm Ao Asc diam:  3.50 cm MITRAL VALVE               TRICUSPID VALVE MV Area (PHT): 3.89 cm    TR Peak grad:   18.1 mmHg MV Decel Time: 195 msec    TR Vmax:        213.00 cm/s MV E velocity: 33.82 cm/s MV A velocity: 61.40 cm/s  SHUNTS MV E/A ratio:  0.55        Systemic VTI:  0.18 m                            Systemic Diam: 2.30 cm Kirk Ruths MD Electronically signed by Kirk Ruths MD Signature Date/Time: 06/13/2020/2:51:47 PM    Final      Ezequiel Essex, MD 06/15/2020, 1:43 PM PGY-1, [place] Intern pager: [phone], text pages welcome

## 2020-06-15 NOTE — Plan of Care (Signed)
°  RD consulted for nutrition education regarding diabetes.   Lab Results  Component Value Date   HGBA1C 15.4 (H) 06/13/2020    RD provided "Carbohydrate Counting for People with Diabetes" handout from the Academy of Nutrition and Dietetics. Discussed different food groups and their effects on blood sugar, emphasizing carbohydrate-containing foods. Provided list of carbohydrates and recommended serving sizes of common foods.  Discussed importance of controlled and consistent carbohydrate intake throughout the day. Provided examples of ways to balance meals/snacks and encouraged intake of high-fiber, whole grain complex carbohydrates. Teach back method used.  Expect fair compliance.  Body mass index is 34.52 kg/m. Pt meets criteria for obesity based on current BMI.  Current diet order is regular, patient is consuming approximately 100% of meals at this time. Labs and medications reviewed. No further nutrition interventions warranted at this time. RD contact information provided. If additional nutrition issues arise, please re-consult RD.  Larkin Ina, MS, RD, LDN RD pager number and weekend/on-call pager number located in [place].

## 2020-06-16 ENCOUNTER — Other Ambulatory Visit (HOSPITAL_COMMUNITY): Payer: Self-pay | Admitting: Family Medicine

## 2020-06-16 LAB — BASIC METABOLIC PANEL
Anion gap: 9 (ref 5–15)
BUN: 16 mg/dL (ref 6–20)
CO2: 22 mmol/L (ref 22–32)
Calcium: 9 mg/dL (ref 8.9–10.3)
Chloride: 104 mmol/L (ref 98–111)
Creatinine, Ser: 1.25 mg/dL — ABNORMAL HIGH (ref 0.61–1.24)
GFR calc Af Amer: >60 mL/min (ref >60)
GFR calc non Af Amer: >60 mL/min (ref >60)
Glucose, Bld: 326 mg/dL — ABNORMAL HIGH (ref 70–99)
Potassium: 4.2 mmol/L (ref 3.5–5.1)
Sodium: 135 mmol/L (ref 135–145)

## 2020-06-16 LAB — GLUCOSE, CAPILLARY
Glucose-Capillary: 336 mg/dL — ABNORMAL HIGH (ref 70–99)
Glucose-Capillary: 237 mg/dL — ABNORMAL HIGH (ref 70–99)

## 2020-06-16 LAB — CBC
HCT: 40.4 % (ref 39.0–52.0)
Hemoglobin: 12.1 g/dL — ABNORMAL LOW (ref 13.0–17.0)
MCH: 24.5 pg — ABNORMAL LOW (ref 26.0–34.0)
MCHC: 30 g/dL (ref 30.0–36.0)
MCV: 81.9 fL (ref 80.0–100.0)
Platelets: 212 10*3/uL (ref 150–400)
RBC: 4.93 MIL/uL (ref 4.22–5.81)
RDW: 14.4 % (ref 11.5–15.5)
WBC: 5.8 10*3/uL (ref 4.0–10.5)
nRBC: 0 % (ref 0.0–0.2)

## 2020-06-16 MED ORDER — LANTUS SOLOSTAR 100 UNIT/ML ~~LOC~~ SOPN: 60.0000 [IU] | PEN_INJECTOR | Freq: Every day | SUBCUTANEOUS | 3 refills | Status: DC

## 2020-06-16 MED ORDER — SPIRONOLACTONE 25 MG PO TABS
25.0000 mg | ORAL_TABLET | Freq: Every day | ORAL | Status: DC
  Administered 2020-06-16: 25 mg via ORAL
  Filled 2020-06-16: qty 1

## 2020-06-16 MED ORDER — INSULIN GLARGINE 100 UNIT/ML ~~LOC~~ SOLN
50.0000 [IU] | Freq: Every day | SUBCUTANEOUS | Status: DC
  Filled 2020-06-16: qty 0.5

## 2020-06-16 MED ORDER — INSULIN GLARGINE 100 UNIT/ML ~~LOC~~ SOLN
60.0000 [IU] | Freq: Every day | SUBCUTANEOUS | Status: DC
  Administered 2020-06-16: 60 [IU] via SUBCUTANEOUS
  Filled 2020-06-16: qty 0.6

## 2020-06-16 NOTE — Plan of Care (Signed)

## 2020-06-16 NOTE — Progress Notes (Signed)
Inpatient Diabetes Program Recommendations  AACE/ADA: New Consensus Statement on Inpatient Glycemic Control (2015)  Target Ranges:  Prepandial:   less than 140 mg/dL      Peak postprandial:   less than 180 mg/dL (1-2 hours)      Critically ill patients:  140 - 180 mg/dL   Results for ROGERIO, BOUTELLE (MRN [id number]) as of 06/16/2020 09:28  Ref. Range 06/15/2020 06:55 06/15/2020 11:38 06/15/2020 16:30 06/15/2020 21:39  Glucose-Capillary Latest Ref Range: 70 - 99 mg/dL 185 (H)  3 units NOVOLOG given at 10:40am  316 (H)  11 units NOVOLOG +  50 units LANTUS (40 units given at 10:41am and 10 units given at 12:58pm)  310 (H)  11 units NOVOLOG  252 (H)  3 units NOVOLOG    Results for ANTWIONE, PICOTTE (MRN [id number]) as of 06/16/2020 09:28  Ref. Range 06/16/2020 06:32  Glucose-Capillary Latest Ref Range: 70 - 99 mg/dL 336 (H)     Home DM Meds: Lantus 45 units BID       Novolog 0-16 units TID per SSI  Current Orders: Lantus 50 units Daily      Novolog Moderate Correction Scale/ SSI (0-15 units) TID AC + HS     MD- Note patient received a total of 50 units Lantus yesterday (40 units given at 10:41am and 10 units given at 12:58pm).  CBG 336 this AM--Eating 100% of meals per PO documentation.  Please consider:  1. Increase Lantus to 60 units Daily (if 50 unit dose already given this AM, please order Lantus 10 units X 1)  2. Start Novolog Meal Coverage: Novolog 8 units TID with meals  (Please add the following Hold Parameters: Hold if pt eats <50% of meal, Hold if pt NPO)     --Will follow patient during hospitalization--  Wyn Quaker RN, MSN, CDE Diabetes Coordinator Inpatient Glycemic Control Team Team Pager: [phone] (8a-5p)

## 2020-06-16 NOTE — Discharge Instructions (Signed)
Dear Greggory Keen,  Thank you for letting us participate in your care.   POST-HOSPITAL & CARE INSTRUCTIONS 1. We changed your Lantus to 60 units daily. Continue your home sliding scale. Please follow up with your primacy care doctor for needed adjustment.  2. We have referred you to nephrology. You will need to have an appointment with them in the future. They will call you to make an appointment.  3. Go to your follow up appointments (listed below)   DOCTOR'S APPOINTMENT   Future Appointments  Date Time Provider [place]  07/01/2020  7:30 AM [facility] DEVICE REMOTES [facility]  09/30/2020  7:30 AM [facility] DEVICE REMOTES [facility]     Take care and be well!  [facility]  [place], [place] [address] [phone]

## 2020-06-16 NOTE — Discharge Summary (Addendum)
[facility] Discharge Summary  Patient name: Jeffrey [name] Medical record number: [id number] Date of birth: 12-May-1966 Age: 54 y.o. 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    Appearance: Normal appearance. He is not ill-appearing.  HENT:     Head: Normocephalic.  Cardiovascular:     Rate and Rhythm: Normal rate and regular rhythm.     Pulses: Normal pulses.     Heart sounds: Normal heart sounds. No murmur heard.   Pulmonary:     Effort: Pulmonary effort is normal. No respiratory distress.     Breath sounds: Normal breath sounds. No wheezing or rhonchi.  Musculoskeletal:     Right lower leg: No edema.     Left lower leg: No edema.  Neurological:     Mental Status: He is alert and oriented to person, place, and time.  Psychiatric:        Mood and Affect: Mood normal.        Behavior: Behavior normal.    Brief Hospital Course:  Jeffrey [name] is a 54 y.o. male  admitted the evening of 7/2 for reported syncopal episodes.  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 Pt non-adherent to medications. Hospital course outlined by problem below. Please see the H&P for additional information.   Syncope Patient arrived to the ED s/p 2 syncopal episodes in the past week.   In the ED, fingerstick blood sugar was found to be 464.  Patient complained of head neck and back pain which he attributed to his syncopal episodes. EKG 7/2 at 1619 revealed normal sinus rhythm, normal axis, appropriate R wave progression, no ST elevation. Orthostatic vitals were negative. CT head negative.  Echo showed EF of 30 to 35%, with global left ventricular hypokinesis and mitral valve regurgitation. Which was improved from his last echo. Unlikely this syncopal episode was a seizure due to no history, incongruent with this events. Syncope was attributed to his increased glucose.   Insulin Dependent T2DM Mr. Cindric arrived to the ED hyperglycemic with a BGL of 464. Patient has a history of non-compliance with all of his medications.  He received 12 U Novolog in the ED, single dose of fentanyl, mIVF NaCl w/ KCL 23mEq in the ED. Patient repeat BGL at admission 217. A1c found to be 15.4 on admission, was 9.9 on 04/2019. IV maintenance fluids were discontinued due to h/o HFrEF and no signs that patient was dry. Home medications include 45U BID lantus and sliding scale novolog with meals. Patient initially started on 20U of Lantus on admission. Morning glucose on day 2 was 363 so another 20U was ordered.  Placed on regular diet to monitor patient glucose on his normal outpatient diet.  Lantus increased daily to reflect glucose levels, was 60 units of Lantus daily at discharge.  Consider adding Metformin, SGLT-2, or others the patient will agree to take. Close outpatient follow-up.  AKI  Initially admitted for AKI.  Creatinine 1.30 > 1.43 > 1.25.  Repeat labs show patient in the range of 1.24-1.4. This likely to be baseline. UA significant for 474 protein, Protein/Cr ratio of 1.68. Entresto, lasix, and spironolactone were restarted prior to discharge. Recommend nephrology outpatient follow-up.   Cough Patient reported 1 week of cough. CXR on admission significant for new linear opacities suggestive of  atelectasis vs less likely infectious/inflammatory foci. Pt with known history of CHF but no signs of fluid overload. Incentive spirometry and Tessalon Perles ordered. Patient was noted to be SOB at times and received 1 albuterol tx to which he responded well.  Pulmonary exam revealed lung fields clear to auscultation bilaterally.  Neck Pain Patient c/o left sided neck pain s/p syncopal episodes. CT Head was negative for fractures, significant for mild C6-7 grade 1 retrolisthesis and cervical spondylosis without high-grade bony spinal canal narrowing. Patient received Baclofen 5TID with moderate relief.  HTN Patient with history of hypertension. Patients home medications include 12.5 Coreg BID, 49-51mg  Entresto, 60mg  Lasix, 25mg  Spironolactone. Patient non-adherent to medications.  Elevated blood pressure throughout admission ranging systolic 323-557, diastolic 32-20.  Coreg was continued at 12.5 twice daily throughout admission. Entresto, lasix, and spironolactone were restarted s/p AKI improvement.  Iron deficiency anemia Hemoglobin 12.9 > 11.6 > 12.1.  Patient appeared to be at baseline and stable.  Home med iron 325 mg daily.  Patient nonadherent with iron supplement, but this was supplied daily during admission.   Continued patient home mediations for hyperlipidemia, CHF, neuropathy, and iron deficiency anemia.   Issues for Follow Up:  1. Need to follow up nephrology outpatient. Ambulatory referral placed at discharge.  2. ACE-I/ARB outpatient. Check BMP. 3. Follow up with PCP for diabetes medication adjustment, consider addition of metformin or SGLT-2.  4. Would advise against taking goody powder. This was stopped at discharge.  Significant Procedures: CT head, renal ultrasound  Significant Labs and Imaging:  Recent Labs  Lab 06/13/20 0027 06/15/20 0942 06/16/20 0606  WBC 6.2 5.8 5.8  HGB 12.9* 11.6* 12.1*  HCT 41.7 38.3* 40.4  PLT 261 211 212   Recent Labs  Lab 06/12/20 1621  06/12/20 1621 06/12/20 1726 06/12/20 1726 06/12/20 1744 06/13/20 0027 06/13/20 0027 06/14/20 0326 06/14/20 0326 06/15/20 0942 06/16/20 0606  NA 133*   < > 136  --   --  137  --  135  --  134* 135  K 4.1   < > 4.1   < >  --  3.1*   < > 4.0   < > 4.1 4.2  CL 99   < > 96*  --   --  101  --  105  --  101 104  CO2 26  --   --   --   --  27  --  22  --  24 22  GLUCOSE 454*   < > 464*  --   --  217*  --  363*  --  316* 326*  BUN 14   < > 18  --   --  14  --  17  --  18 16  CREATININE 1.48*   < > 1.40*  --   --  1.24  --  1.30*  --  1.43* 1.25*  CALCIUM 8.8*  --   --   --   --  9.3  --  8.4*  --  8.8* 9.0  MG  --   --   --   --  1.6*  --   --   --   --   --   --   ALKPHOS  --   --   --   --  86  --   --   --   --   --   --   AST  --   --   --   --  21  --   --   --   --   --   --   ALT  --   --   --   --  16  --   --   --   --   --   --   ALBUMIN  --   --   --   --  3.2*  --   --   --   --   --   --    < > = values in this interval not displayed.   PORTABLE CHEST 1 VIEW COMPARISON:  12/03/2019 CT chest and chest radiograph. IMPRESSION: New linear bibasilar opacities. Differential includes atelectasis, less likely infectious/inflammatory foci. Cardiomegaly.  CT HEAD WITHOUT CONTRAST CT CERVICAL SPINE WITHOUT CONTRAST COMPARISON:  Head CT 09/29/2019, CT cervical spine 10/16/2018 IMPRESSION: CT head: 1. No evidence of acute intracranial abnormality. 2. Stable mild generalized parenchymal atrophy and chronic small vessel ischemic disease. 3. Mild ethmoid and right maxillary sinus mucosal thickening. CT cervical spine: 1. No evidence of acute fracture to the cervical spine. 2. Mild C6-C7 grade 1 retrolisthesis. 3. Cervical spondylosis without high-grade bony spinal canal Narrowing.  RENAL / URINARY TRACT ULTRASOUND COMPLETE COMPARISON:  Prior CT from 12/03/2019 IMPRESSION: Normal renal ultrasound. No hydronephrosis or other significant finding.   Results/Tests Pending at  Time of Discharge: None  Discharge Medications:  Allergies as of 06/16/2020   No Known Allergies     Medication List    STOP taking these medications   GOODY HEADACHE PO   traZODone 100 MG tablet Commonly known as: DESYREL     TAKE these medications   albuterol 108 (90 Base) MCG/ACT inhaler Commonly known as: VENTOLIN HFA Inhale 1-2 puffs into the lungs every 6 (six) hours as needed for wheezing or shortness of breath.   aspirin 81 MG EC tablet Take 1 tablet (81 mg total) by mouth daily.   carvedilol 12.5 MG tablet Commonly known as: COREG Take 1 tablet (12.5 mg total) by mouth 2 (two) times daily with a meal.   cetirizine 10 MG tablet Commonly known as: ZYRTEC Take 1 tablet (10 mg total) by mouth daily. What changed:   when to take this  reasons to take this   cyclobenzaprine 5 MG tablet Commonly known as: FLEXERIL Take 1 tablet (5 mg total) by mouth at bedtime as needed for muscle spasms.   Entresto 49-51 MG Generic drug: sacubitril-valsartan TAKE 1 TABLET BY MOUTH 2 TIMES DAILY.   ferrous sulfate 325 (65 FE) MG tablet Take 1 tablet (325 mg total) by mouth 2 (two) times daily with a meal. What changed: when to take this   furosemide 40 MG tablet Commonly known as: LASIX Take 1.5 tablets (60 mg total) by mouth daily.   gabapentin 400 MG capsule Commonly known as: NEURONTIN TAKE 1 CAPSULE BY MOUTH 3 TAKE TIMES DAILY. What changed:   how much to take  how to take this  when to take this  additional instructions   glucose blood test strip Commonly known as: True Metrix Blood Glucose Test Use 3 times daily  before meals   insulin aspart 100 UNIT/ML injection Commonly known as: novoLOG Inject 0-15 Units into the skin 3 (three) times daily with meals. Sliding scale  CBG 70 - 120: 0 units: CBG 121 - 140: 2 units; CBG 140 - 200: 4 units; CBG 201 - 240: 6 units; CBG 240 - 300: 8 units;CBG 300 - 350: 12 units; CBG 351 - 400: 16 units; CBG > 400 : 16 units  and notify your  MD What changed:   how much to take  additional instructions   Lantus SoloStar 100 UNIT/ML Solostar Pen Generic drug: insulin glargine Inject 60 Units into the skin daily. What changed:   how much to take  when to take this   ondansetron 4 MG tablet Commonly known as: ZOFRAN Take 1 tablet (4 mg total) by mouth every 6 (six) hours as needed for nausea or vomiting.   potassium chloride 10 MEQ tablet Commonly known as: KLOR-CON Take 1 tablet (10 mEq total) by mouth daily.   rosuvastatin 40 MG tablet Commonly known as: Crestor Take 1 tablet (40 mg total) by mouth daily.   spironolactone 25 MG tablet Commonly known as: ALDACTONE Take 1 tablet (25 mg total) by mouth daily.   True Metrix Meter Devi 1 each by Does not apply route 3 (three) times daily before meals.   TRUEplus Lancets 28G Misc 1 each by Does not apply route 3 (three) times daily before meals.   TRUEplus Pen Needles 32G X 4 MM Misc Generic drug: Insulin Pen Needle USE TO INJECT LANTUS DAILY. MUST USE NEW PEN NEEDLE WITH EACH INJECTION. What changed: See the new instructions.       Discharge Instructions: Please refer to Patient Instructions section of EMR for full details.  Patient was counseled important signs and symptoms that should prompt return to medical care, changes in medications, dietary instructions, activity restrictions, and follow up appointments.   Follow-Up Appointments:  Follow-up Information    Charlott Rakes, MD. Schedule an appointment as soon as possible for a visit in 2 day(s).   Specialty: Family Medicine Why: For a hospital follow up Contact information: [address] [place] [address] [phone]        Nahser, Wonda Cheng, MD Follow up.   Specialty: Cardiology Contact information: [place] [address] [phone]               Ezequiel Essex, MD 06/16/2020, 3:40 PM PGY-1, [place] Autry-Lott, DO 06/16/2020, 4:14 PM PGY-2, [place]

## 2020-06-16 NOTE — Progress Notes (Addendum)
Family Medicine Teaching Service Daily Progress Note Intern Pager: [phone]  Patient name: Jeffrey [name] Medical record number: [id number] Date of birth: May 24, 1966 Age: 54 y.o. Gender: male  Primary Care Provider: Charlott Rakes, MD Consultants: None Code Status: Full  Pt Overview and Major Events to Date:  Patient is currently hospital day 4.  Originally admitted for syncope, was found to have nonketotic hyperosmolar state and AKI.  Assessment and Plan: Jeffrey Runco Robersonis a 54 y.o.malepresenting with syncope. PMH is significant forpoorly controlled T2DM, HTN, diabetic neuropathy,1st and 2ndright toe amputations, iron-deficiency anemia, CHF, NICM, CAD, HLD, syncope, ICD. Pt non-adherent to medications.  Syncope  Admitted for 2 occurrences in the past week.  CT head negative.  Echo shows EF of 30 to 35%, with global left ventricular hypokinesis and mitral valve regurgitation.  Unlikely seizure due to no history, incongruent witnessed events.  Creatinine 1.43 > 1.25, hemoglobin 11.6 > 12.1, glucose 316 > 326. -Orthostatic vitals negative -Fall precautions -Continue following BMP labs -Ambulate with pulse ox  Non ketotic hyperosmolar state 2/2 insulin dependent T2DM Admission A1c 15.4, up from previous 9.20 Apr 2019.  Home diabetes meds include 45 units Lantus twice daily and sliding scale NovoLog with meals; however the patient only takes it "when he feels really bad". Creatinine 1.43 > 1.25, hemoglobin 11.6 > 12.1, glucose 316 > 326. -Diabetes coordinator on board -Allow regular diet to attempt to approximate insulin need at home -New insulin dose of Lantus 60 units scheduled to begin today, up from 50 units yesterday -Entresto 49-51 mg started 7/6 -CBG measured before meals and at bedtime -Maintenance IV fluids have been discontinued -Home gabapentin on for neuropathic pain 400 mg p.o. 3 times daily -Close outpatient follow-up -Consider adding Metformin, SGLT-2, or  others if patient will agree to take  AKI Creatinine 1.43 > 1.25, BUN/creatinine ratio 13.07. Urine protein to creatinine ratio 1.68.  Renal ultrasound normal. -Morning creatinine 1.25 -Continue strict I's and O's -Discontinued IV fluids, as noted above  Cough Patient reports that he has a had a coughx1 week.Patient received CXR at admission was found to have new linear opacities suggestive of atelectasis vs less likely infectious/inflammatory foci. Patient also has known hx of CHF; however patient does not have peripheral edema no fluid excess on the lungs at this time. -Albuterol q6h PRN SOB - incentive spirometry - tessalon perles  Neck Pain Patient notes pain to the left posterior side of his neck. Believes this occurred after syncopal episode. On palpation of neck, no signs of acute distress or discomfort. Seems to be more MSK in origin. CT Head on 7/2 significant for mild C6-7 grade 1 retrolisthesis and cervical spondylosis without high-grade bony spinal canal narrowing. Negative for fractures. -Baclofen 5 TID  HFrEF, NICM ICD implanted 01/2018. Last echo showed EF20%, moderately dilated left atrium, and mildly dilated right ventricle.Most recent echo was a difficult study due to poor window. Showed EF of 30-35%, global left ventricular hypokinesis and mitral regurgitation. - holding lasix for aki and no hypervolemia on exam - daily weights - strict I/O -Started Entresto and furosemide 7/5 -Starting spironolactone today, 7/6  HTN BP elevated on admission to 135-167/82-99.Last 24 hours show range of (132/91)-(167/103).Home meds: coreg 12.5mg BID, entresto 49-51mg , lasix 60mg  daily, spironolactone 25mg . Patient non-adherent. - continue coreg as above  Iron deficiency anemia Hemoglobin 12.9, MCV80.7. home meds:iron 325 mg daily. Not adherent with iron supplement. Appears to be at baseline and stable - continue home iron  HLD Home meds: rosuvastatin 40mg .  Last  lipid panel 04/2019 showed total cholesterol 137, HDL 36, LDL 57 - continuestatin  FEN/GI:Full EZM:OQHUTML  Disposition:  Medically stable, potential discharge tonight  Subjective:  Patient was found reclining comfortably in bed upon entering the room.  He reports that he slept well overnight, and has no complaints.  He started on Keppra yesterday.  He denies any headaches, dizziness, vision changes, nausea, vomiting.  He currently has no complaints.  I discussed starting more home medications today.  I asked him how he would feel about possibly discharging later tonight and he expressed that he was not comfortable with that.  Will likely discharge tomorrow, pending tolerance of new medicines.  Objective: Temp:  [97.8 F (36.6 C)-98.8 F (37.1 C)] 98.8 F (37.1 C) (07/06 0521) Pulse Rate:  [68-73] 73 (07/06 0521) Resp:  [18-20] 18 (07/06 0521) BP: (161-173)/(90-96) 161/96 (07/06 0521) SpO2:  [94 %-98 %] 94 % (07/06 0521)  Physical Exam Constitutional:      Appearance: Normal appearance. He is normal weight.  HENT:     Head: Normocephalic.  Cardiovascular:     Rate and Rhythm: Normal rate and regular rhythm.     Pulses: Normal pulses.     Heart sounds: Normal heart sounds. No murmur heard.  No gallop.   Pulmonary:     Effort: Pulmonary effort is normal. No respiratory distress.     Breath sounds: Normal breath sounds. No wheezing.  Musculoskeletal:     Right lower leg: No edema.     Left lower leg: No edema.  Skin:    General: Skin is warm and dry.  Neurological:     Mental Status: He is alert and oriented to person, place, and time.  Psychiatric:        Mood and Affect: Mood normal.        Thought Content: Thought content normal.      Laboratory: Recent Labs  Lab 06/13/20 0027 06/15/20 0942 06/16/20 0606  WBC 6.2 5.8 5.8  HGB 12.9* 11.6* 12.1*  HCT 41.7 38.3* 40.4  PLT 261 211 212   Recent Labs  Lab 06/12/20 1744 06/13/20 0027 06/14/20 0326  06/15/20 0942 06/16/20 0606  NA  --    < > 135 134* 135  K  --    < > 4.0 4.1 4.2  CL  --    < > 105 101 104  CO2  --    < > 22 24 22   BUN  --    < > 17 18 16   CREATININE  --    < > 1.30* 1.43* 1.25*  CALCIUM  --    < > 8.4* 8.8* 9.0  PROT 7.0  --   --   --   --   BILITOT 0.5  --   --   --   --   ALKPHOS 86  --   --   --   --   ALT 16  --   --   --   --   AST 21  --   --   --   --   GLUCOSE  --    < > 363* 316* 326*   < > = values in this interval not displayed.   Imaging/Diagnostic Tests: No results found.   Ezequiel Essex, MD 06/16/2020, 9:39 AM PGY-1, [place] Intern pager: [phone], text pages welcome

## 2020-06-16 NOTE — Evaluation (Signed)
Physical Therapy Evaluation Patient Details Name: Jeffrey [name] MRN: [id number] DOB: 07/14/1966 Today's Date: 06/16/2020   History of Present Illness  Jeffrey [name] is a 54 y.o. male presenting with syncope. PMH is significant for poorly controlled T2DM, HTN, diabetic neuropathy, right toe amputations, iron-deficiency anemia, CHF, NICM, CAD, HLD, syncope, ICD  Clinical Impression  PTA pt reports living with girlfriend in single story home with 2 steps to enter. Pt reports independence in mobility with use of a cane. Pt also reports independence in ADLs/iADLs. Pt is limited in safe mobility by poor safety awareness in presence of decreased dynamic balance and generalized weakness. Pt currently is mod I for bed mobility, min guard for sit>stand with cane, min A for ambulation with cane and min guard for ambulation with RW. Pt requires mod A for ascent/descent of 2 steps without rails, and min A for ascent/descent of 2 steps with cane. PT recommending use of RW with mobility and HHPT to improve balance and strength in home environment. Pt will likely d/c this afternoon.     Follow Up Recommendations Home health PT;Supervision/Assistance - 24 hour    Equipment Recommendations  None recommended by PT (has RW and cane)       Precautions / Restrictions Precautions Precautions: Fall Restrictions Weight Bearing Restrictions: No      Mobility  Bed Mobility Overal bed mobility: Modified Independent             General bed mobility comments: use of bed rails  Transfers Overall transfer level: Needs assistance Equipment used: Straight cane Transfers: Sit to/from Stand Sit to Stand: Min guard         General transfer comment: min guard for power up from bed and toilet, increased time and effort to balance  Ambulation/Gait Ambulation/Gait assistance: Min guard;Min assist Gait Distance (Feet): 500 Feet Assistive device: Rolling walker (2 wheeled);Straight cane Gait  Pattern/deviations: Step-through pattern;Shuffle;Trunk flexed;Wide base of support Gait velocity: slowed Gait velocity interpretation: <1.31 ft/sec, indicative of household ambulator General Gait Details: min A for steadying with cane, hands on min guard progressing to min guard with RW, cued for proximity and upright posture  Stairs Stairs: Yes Stairs assistance: Mod assist;Min assist Stair Management: No rails;Forwards;With cane;Alternating pattern Number of Stairs: 2 General stair comments: modA for ascent/descent with no rails, min A for steadying with cane use      Balance Overall balance assessment: Needs assistance Sitting-balance support: Feet supported;No upper extremity supported Sitting balance-Leahy Scale: Fair     Standing balance support: No upper extremity supported;During functional activity Standing balance-Leahy Scale: Fair Standing balance comment: able to put on mask without assist for balance, requires at least single UE support with dynamic balance                             Pertinent Vitals/Pain Pain Assessment: No/denies pain    Home Living Family/patient expects to be discharged to:: Private residence Living Arrangements: Spouse/significant other Available Help at Discharge: Family;Available 24 hours/day Type of Home: House Home Access: Stairs to enter Entrance Stairs-Rails: None Entrance Stairs-Number of Steps: 2 Home Layout: One level Home Equipment: Walker - 2 wheels;Cane - single point      Prior Function Level of Independence: Independent with assistive device(s)         Comments: utilizes cane for mobility         Extremity/Trunk Assessment   Upper Extremity Assessment Upper Extremity Assessment: Defer to OT  evaluation    Lower Extremity Assessment Lower Extremity Assessment: Generalized weakness       Communication   Communication: No difficulties  Cognition Arousal/Alertness: Awake/alert Behavior During  Therapy: WFL for tasks assessed/performed Overall Cognitive Status: Impaired/Different from baseline Area of Impairment: Memory;Attention;Following commands;Safety/judgement;Problem solving;Awareness                   Current Attention Level: Selective Memory: Decreased short-term memory Following Commands: Follows one step commands with increased time;Follows multi-step commands with increased time;Follows multi-step commands inconsistently Safety/Judgement: Decreased awareness of safety;Decreased awareness of deficits Awareness: Emergent Problem Solving: Slow processing;Difficulty sequencing;Requires verbal cues;Requires tactile cues General Comments: pt has difficulty remembering home set up, has decreased safety awareness with RW and requires increased cuing       General Comments General comments (skin integrity, edema, etc.): VSS on RA, reinforced need to be compliant with DM medication and decreased sugar intake        Assessment/Plan    PT Assessment Patient needs continued PT services  PT Problem List Decreased strength;Decreased activity tolerance;Decreased balance;Decreased mobility;Decreased cognition;Decreased knowledge of use of DME;Decreased safety awareness       PT Treatment Interventions DME instruction;Gait training;Functional mobility training;Therapeutic activities;Stair training;Therapeutic exercise;Balance training;Cognitive remediation;Patient/family education    PT Goals (Current goals can be found in the Care Plan section)  Acute Rehab PT Goals Patient Stated Goal: take better care of himself PT Goal Formulation: With patient Time For Goal Achievement: 06/30/20 Potential to Achieve Goals: Fair    Frequency Min 3X/week    AM-PAC PT "6 Clicks" Mobility  Outcome Measure Help needed turning from your back to your side while in a flat bed without using bedrails?: None Help needed moving from lying on your back to sitting on the side of a flat bed  without using bedrails?: None Help needed moving to and from a bed to a chair (including a wheelchair)?: None Help needed standing up from a chair using your arms (e.g., wheelchair or bedside chair)?: None Help needed to walk in hospital room?: None Help needed climbing 3-5 steps with a railing? : A Little 6 Click Score: 23    End of Session Equipment Utilized During Treatment: Gait belt Activity Tolerance: Patient tolerated treatment well Patient left: in chair;with call bell/phone within reach;with chair alarm set;Other (comment) (physician in room ) Nurse Communication: Mobility status PT Visit Diagnosis: Unsteadiness on feet (R26.81);Other abnormalities of gait and mobility (R26.89);Muscle weakness (generalized) (M62.81);Difficulty in walking, not elsewhere classified (R26.2)    Time: 5035-4656 PT Time Calculation (min) (ACUTE ONLY): 12 min   Charges:   PT Evaluation $PT Eval Moderate Complexity: 1 Mod          Aarohi Redditt B. Migdalia Dk PT, DPT Acute Rehabilitation Services Pager [phone] Office [phone]   [place] 06/16/2020, 2:54 PM

## 2020-06-16 NOTE — Progress Notes (Signed)
DISCHARGE NOTE HOME  Jeffrey [name] to be discharged Home per MD order. Discussed prescriptions and follow up appointments with the patient. Prescriptions given to patient; medication list explained in detail. Patient verbalized understanding.  Skin clean, dry and intact without evidence of skin break down, no evidence of skin tears noted. IV catheter discontinued intact. Site without signs and symptoms of complications. Dressing and pressure applied. Pt denies pain at the site currently. No complaints noted.  Patient free of lines, drains, and wounds.   An After Visit Summary (AVS) was printed and given to the patient. Patient escorted via wheelchair, and discharged home via private auto.  Beatris Ship, RN

## 2020-06-16 NOTE — Evaluation (Signed)
Occupational Therapy Evaluation Patient Details Name: Jeffrey [name] MRN: [id number] DOB: Dec 27, 1965 Today's Date: 06/16/2020    History of Present Illness Jeffrey [name] is a 54 y.o. male presenting with syncope. PMH is significant for poorly controlled T2DM, HTN, diabetic neuropathy, right toe amputations, iron-deficiency anemia, CHF, NICM, CAD, HLD, syncope, ICD   Clinical Impression   This 54 yo male admitted with above presents to acute OT with PLOF report of independent with basic ADLs, IADLs, and driving. Currently he is min-min guard A when up on his feet for basic ADLs. He will benefit from acute OT without need for follow up.    Follow Up Recommendations  No OT follow up;Supervision/Assistance - 24 hour    Equipment Recommendations  None recommended by OT       Precautions / Restrictions Precautions Precautions: Fall Restrictions Weight Bearing Restrictions: No      Mobility Bed Mobility Overal bed mobility: Modified Independent             General bed mobility comments: use of bed rails  Transfers Overall transfer level: Needs assistance Equipment used: Straight cane Transfers: Sit to/from Stand Sit to Stand: Min guard         General transfer comment: min guard for power up from bed and toilet, increased time and effort to balance    Balance Overall balance assessment: Needs assistance Sitting-balance support: Feet supported;No upper extremity supported Sitting balance-Leahy Scale: Fair     Standing balance support: No upper extremity supported;During functional activity Standing balance-Leahy Scale: Fair Standing balance comment: able to put on mask without assist for balance, requires at least single UE support with dynamic balance                           ADL either performed or assessed with clinical judgement   ADL Overall ADL's : Needs assistance/impaired Eating/Feeding: Independent   Grooming: Min guard;Standing    Upper Body Bathing: Set up;Sitting   Lower Body Bathing: Min guard;Sit to/from stand   Upper Body Dressing : Set up;Sitting   Lower Body Dressing: Min guard;Sit to/from stand   Toilet Transfer: Minimal assistance;Ambulation   Toileting- Clothing Manipulation and Hygiene: Min guard;Sit to/from Nurse, children's Details (indicate cue type and reason): encouarged pt to have a seat to sit on in shower due to decreased balance         Vision Patient Visual Report: No change from baseline              Pertinent Vitals/Pain Pain Assessment: No/denies pain     Hand Dominance  right   Extremity/Trunk Assessment Upper Extremity Assessment Upper Extremity Assessment: Generalized weakness   Lower Extremity Assessment Lower Extremity Assessment: Generalized weakness       Communication Communication Communication: No difficulties   Cognition Arousal/Alertness: Awake/alert Behavior During Therapy: WFL for tasks assessed/performed Overall Cognitive Status: Impaired/Different from baseline Area of Impairment: Memory;Attention;Following commands;Safety/judgement;Problem solving;Awareness                   Current Attention Level: Selective Memory: Decreased short-term memory Following Commands: Follows one step commands with increased time;Follows multi-step commands with increased time;Follows multi-step commands inconsistently Safety/Judgement: Decreased awareness of safety;Decreased awareness of deficits Awareness: Emergent Problem Solving: Slow processing;Difficulty sequencing;Requires verbal cues;Requires tactile cues General Comments: pt has difficulty remembering home set up, has decreased safety awareness with RW and requires increased cuing    General Comments  VSS on RA, reinforced need to be compliant with DM medication and decreased sugar intake            Home Living Family/patient expects to be discharged to:: Private residence Living  Arrangements: Spouse/significant other Available Help at Discharge: Family;Available 24 hours/day Type of Home: House Home Access: Stairs to enter [employer] of Steps: 2 Entrance Stairs-Rails: None Home Layout: One level     Bathroom Shower/Tub: Astronomer Accessibility: Yes   Home Equipment: Environmental consultant - 2 wheels;Cane - single point          Prior Functioning/Environment Level of Independence: Independent with assistive device(s)        Comments: utilizes cane for mobility         OT Problem List: Impaired balance (sitting and/or standing);Decreased cognition      OT Treatment/Interventions: Self-care/ADL training;DME and/or AE instruction;Patient/family education;Balance training    OT Goals(Current goals can be found in the care plan section) Acute Rehab OT Goals Patient Stated Goal: take better care of myself OT Goal Formulation: With patient Time For Goal Achievement: 06/30/20 Potential to Achieve Goals: Good  OT Frequency: Min 2X/week              AM-PAC OT "6 Clicks" Daily Activity     Outcome Measure Help from another person eating meals?: None Help from another person taking care of personal grooming?: A Little Help from another person toileting, which includes using toliet, bedpan, or urinal?: A Little Help from another person bathing (including washing, rinsing, drying)?: A Little Help from another person to put on and taking off regular upper body clothing?: A Little Help from another person to put on and taking off regular lower body clothing?: A Little 6 Click Score: 19   End of Session Equipment Utilized During Treatment: Gait belt;Rolling walker (SPC)  Activity Tolerance: Patient tolerated treatment well Patient left: in chair;with call bell/phone within reach;with chair alarm set  OT Visit Diagnosis: Unsteadiness on feet (R26.81);Other abnormalities of gait and mobility (R26.89);Other symptoms and signs involving  cognitive function                Time: 1357-1410 OT Time Calculation (min): 13 min Charges:  OT General Charges $OT Visit: 1 Visit OT Evaluation $OT Eval Moderate Complexity: [place], OTR/L Acute [employer] Pager [phone] Office [phone]     Almon Register 06/16/2020, 3:46 PM

## 2020-06-17 ENCOUNTER — Telehealth: Payer: Self-pay

## 2020-06-17 NOTE — Telephone Encounter (Signed)
Transition Care Management Follow-up Telephone Call Date of discharge and from where: 06/16/2020, [facility]   Calls placed to patient # [phone] and # [phone], messages left at both numbers requesting a call back to this CM. # [phone].  Patient needs to schedule hospital follow up appt with Dr Margarita Rana

## 2020-06-18 ENCOUNTER — Other Ambulatory Visit: Payer: Self-pay | Admitting: Family Medicine

## 2020-06-18 ENCOUNTER — Telehealth: Payer: Self-pay

## 2020-06-18 DIAGNOSIS — Z794 Long term (current) use of insulin: Secondary | ICD-10-CM

## 2020-06-18 MED ORDER — TRUE METRIX METER DEVI: 0 refills | Status: DC

## 2020-06-18 NOTE — Telephone Encounter (Signed)
Transition Care Management Follow-up Telephone Call Attempt # 2 Date of discharge and from where: 7/6/2021[facility]   Call placed to patient # [phone], message left with call back requested to this CM # [phone].  Call placed to # [phone], [name], his girlfriend answered and said he was sleeping and she would have him call back.   He needs to schedule hospital follow up appt with Dr Margarita Rana

## 2020-06-18 NOTE — Telephone Encounter (Signed)
Requested Prescriptions  Pending Prescriptions Disp Refills  . Blood Glucose Monitoring Suppl (TRUE METRIX METER) DEVI 1 each 0    Sig: Check blood sugar three times / day, before meals.     Endocrinology: Diabetes - Testing Supplies Passed - 06/18/2020  1:55 PM      Passed - Valid encounter within last 12 months    Recent Outpatient Visits          5 months ago Screening for colon cancer   [place], Charlane Ferretti, MD   1 year ago Chronic combined systolic (congestive) and diastolic (congestive) heart failure ([place])   [place], Charlane Ferretti, MD   1 year ago Uncontrolled type 2 diabetes mellitus with hyperglycemia, with long-term current use of insulin ([place])   [place], Charlane Ferretti, MD   1 year ago Chronic combined systolic (congestive) and diastolic (congestive) heart failure ([place])   [place], Charlane Ferretti, MD   1 year ago Type 2 diabetes mellitus with other specified complication, with long-term current use of insulin ([place])   [place], MD      Future Appointments            In 3 weeks Charlott Rakes, MD [place]            Call placed to pt.  Spoke with [name], pt's wife. Questioned if pt. needed a new meter, or refill of test strips.  Per wife, the pt. Needs a new kit with both meter and test strips. Advised will send in refill.

## 2020-06-18 NOTE — Telephone Encounter (Signed)
Medication Refill - Medication: Blood Glucose Monitoring Suppl (TRUE METRIX METER) DEVI    Preferred Pharmacy (with phone number or street name):  [facility] [address] Phone:  [phone]  Fax:  [phone]       Agent: Please be advised that RX refills may take up to 3 business days. We ask that you follow-up with your pharmacy.

## 2020-06-19 MED FILL — ACCU-CHEK GUIDE W/DEVICE KI: W/DEVICE | 1 days supply | Qty: 1 | Fill #0

## 2020-06-23 ENCOUNTER — Other Ambulatory Visit: Payer: Self-pay | Admitting: Pharmacist

## 2020-06-23 DIAGNOSIS — Z794 Long term (current) use of insulin: Secondary | ICD-10-CM

## 2020-06-23 MED ORDER — ACCU-CHEK GUIDE VI STRP: ORAL_STRIP | 2 refills | Status: DC

## 2020-06-23 MED ORDER — ACCU-CHEK SOFTCLIX LANCETS MISC: 2 refills | Status: DC

## 2020-06-23 MED FILL — ACCU-CHEK GUIDE TEST STRIP: 30 days supply | Qty: 100 | Fill #0

## 2020-06-23 MED FILL — ACCU-CHEK FASTCLIX LANCETS: 34 days supply | Qty: 102 | Fill #0

## 2020-07-01 ENCOUNTER — Ambulatory Visit (INDEPENDENT_AMBULATORY_CARE_PROVIDER_SITE_OTHER): Payer: Medicaid Other | Admitting: *Deleted

## 2020-07-01 DIAGNOSIS — I472 Ventricular tachycardia, unspecified: Secondary | ICD-10-CM

## 2020-07-02 LAB — CUP PACEART REMOTE DEVICE CHECK
Battery Remaining Longevity: 112 mo
Battery Voltage: 3.01 V
Brady Statistic RV Percent Paced: 0.01 %
Date Time Interrogation Session: 20210721023323
HighPow Impedance: 52 Ohm
Implantable Lead Implant Date: 20190204
Implantable Lead Location: 753860
Implantable Pulse Generator Implant Date: 20190204
Lead Channel Impedance Value: 285 Ohm
Lead Channel Impedance Value: 342 Ohm
Lead Channel Pacing Threshold Amplitude: 1.25 V
Lead Channel Pacing Threshold Pulse Width: 0.4 ms
Lead Channel Sensing Intrinsic Amplitude: 10 mV
Lead Channel Sensing Intrinsic Amplitude: 10 mV
Lead Channel Setting Pacing Amplitude: 2.75 V
Lead Channel Setting Pacing Pulse Width: 0.4 ms
Lead Channel Setting Sensing Sensitivity: 0.3 mV

## 2020-07-03 NOTE — Progress Notes (Signed)
Remote ICD transmission.   

## 2020-07-06 ENCOUNTER — Telehealth: Payer: Self-pay

## 2020-07-06 NOTE — Telephone Encounter (Signed)
Remote transmission received, no clear indication for why manual transmission was sent.  Optivol is elevated with thoracic impedence trending down.    Attempted to reach pt to determine reason for manual transmission and HF symptoms/ med compliance.  No answer, LVM with DC # and hours to return call.

## 2020-07-09 ENCOUNTER — Other Ambulatory Visit: Payer: Self-pay

## 2020-07-09 ENCOUNTER — Ambulatory Visit
Admission: EM | Admit: 2020-07-09 | Discharge: 2020-07-09 | Disposition: A | Discharge status: Home / Self Care | Payer: Medicaid Other | Attending: Physician Assistant | Admitting: Physician Assistant

## 2020-07-09 ENCOUNTER — Encounter: Payer: Self-pay | Admitting: Emergency Medicine

## 2020-07-09 DIAGNOSIS — M542 Cervicalgia: Secondary | ICD-10-CM

## 2020-07-09 MED ORDER — TIZANIDINE HCL 2 MG PO TABS
2.0000 mg | ORAL_TABLET | Freq: Three times a day (TID) | ORAL | 0 refills | Status: DC | PRN
Start: 2020-07-09 — End: 2021-01-13
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
## 2020-07-10 ENCOUNTER — Other Ambulatory Visit (HOSPITAL_COMMUNITY)
Admission: RE | Admit: 2020-07-10 | Discharge: 2020-07-10 | Disposition: A | Discharge status: Home / Self Care | Payer: Medicaid Other | Source: Ambulatory Visit | Attending: Ophthalmology | Admitting: Ophthalmology

## 2020-07-10 DIAGNOSIS — Z01812 Encounter for preprocedural laboratory examination: Secondary | ICD-10-CM | POA: Insufficient documentation

## 2020-07-10 DIAGNOSIS — Z20822 Contact with and (suspected) exposure to covid-19: Secondary | ICD-10-CM | POA: Diagnosis not present

## 2020-07-10 LAB — SARS CORONAVIRUS 2 (TAT 6-24 HRS): SARS Coronavirus 2: NEGATIVE

## 2020-07-10 MED FILL — tiZANidine HCL 2 MG TABS: 2 | 5 days supply | Qty: 15 | Fill #0

## 2020-07-13 ENCOUNTER — Encounter (HOSPITAL_COMMUNITY): Payer: Self-pay | Admitting: Ophthalmology

## 2020-07-13 ENCOUNTER — Other Ambulatory Visit: Payer: Self-pay

## 2020-07-13 ENCOUNTER — Encounter: Payer: Self-pay | Admitting: Internal Medicine

## 2020-07-13 NOTE — Progress Notes (Signed)
Mr Molstad denies chest pain or shortness of breath. Patient tested negative for Covid_7/30/21_ and has been in quarantine since that time.  Mr Shi has type II diabetes, patient reports that CBG's in the past 3 weeks have ranged  from 63- 142, no CBC's > 200. Mr Kiraly asked me to speak to his wife with instructions.  I instructed patient to take 30 units of Lantus tonight, in am to take 1/2 of sliding scale Insulin dose  if CBG > 200.  I instructed patient to check CBG after awaking and every 2 hours until arrival  to the hospital.  I Instructed patient if CBG is less than 70 to take 4 Glucose Tablets . Recheck CBG in 15 minutes then call pre- op desk at [phone] for further instructions.

## 2020-07-13 NOTE — Progress Notes (Signed)
PERIOPERATIVE PRESCRIPTION FOR IMPLANTED CARDIAC DEVICE PROGRAMMING  Patient Information: Name:  Jeffrey [name]  DOB:  06-01-1966  MRN:  [id number]   Planned Procedure: Repair of complex traction retinal detachment with membrane peel and air, gas, silicone ointment  Surgeon: Dr. Mellody Life  Date of Procedure: 07/14/20  Cautery will be used.  Position during surgery: supine   Please send documentation back to:  [facility] (Fax # [phone])   Device Information:  Clinic EP Physician:  Virl Axe, MD  Device Type:  Defibrillator Manufacturer and Phone #:  Medtronic: [phone] Pacemaker Dependent?:  No. Date of Last Device Check:  07/01/2020 Normal Device Function?:  Yes.    Electrophysiologist's Recommendations:   Have magnet available.  Provide continuous ECG monitoring when magnet is used or reprogramming is to be performed.   Procedure may interfere with device function.  Magnet should be placed over device during procedure.  Per [facility] [address], [place], [place]  1:36 PM 07/13/2020

## 2020-07-14 ENCOUNTER — Encounter (HOSPITAL_COMMUNITY): Admission: RE | Payer: Self-pay | Source: Home / Self Care

## 2020-07-14 ENCOUNTER — Ambulatory Visit (HOSPITAL_COMMUNITY): Payer: Medicare Other

## 2020-07-14 ENCOUNTER — Ambulatory Visit (HOSPITAL_COMMUNITY)
Admission: RE | Admit: 2020-07-14 | Discharge: 2020-07-14 | Disposition: A | Discharge status: Home / Self Care | Payer: Medicare Other | Attending: Ophthalmology | Admitting: Ophthalmology

## 2020-07-14 ENCOUNTER — Encounter (HOSPITAL_COMMUNITY)
Admission: RE | Disposition: A | Discharge status: Home / Self Care | Payer: Self-pay | Source: Home / Self Care | Attending: Ophthalmology

## 2020-07-14 ENCOUNTER — Ambulatory Visit (HOSPITAL_COMMUNITY): Admission: RE | Admit: 2020-07-14 | Payer: Medicaid Other | Source: Home / Self Care | Admitting: Ophthalmology

## 2020-07-14 ENCOUNTER — Encounter (HOSPITAL_COMMUNITY): Payer: Self-pay | Admitting: Ophthalmology

## 2020-07-14 DIAGNOSIS — E785 Hyperlipidemia, unspecified: Secondary | ICD-10-CM | POA: Diagnosis not present

## 2020-07-14 DIAGNOSIS — Z87442 Personal history of urinary calculi: Secondary | ICD-10-CM | POA: Insufficient documentation

## 2020-07-14 DIAGNOSIS — I5022 Chronic systolic (congestive) heart failure: Secondary | ICD-10-CM | POA: Insufficient documentation

## 2020-07-14 DIAGNOSIS — Z833 Family history of diabetes mellitus: Secondary | ICD-10-CM | POA: Diagnosis not present

## 2020-07-14 DIAGNOSIS — Z95810 Presence of automatic (implantable) cardiac defibrillator: Secondary | ICD-10-CM | POA: Insufficient documentation

## 2020-07-14 DIAGNOSIS — E113521 Type 2 diabetes mellitus with proliferative diabetic retinopathy with traction retinal detachment involving the macula, right eye: Secondary | ICD-10-CM | POA: Insufficient documentation

## 2020-07-14 DIAGNOSIS — I110 Hypertensive heart disease with heart failure: Secondary | ICD-10-CM | POA: Diagnosis not present

## 2020-07-14 DIAGNOSIS — Z8249 Family history of ischemic heart disease and other diseases of the circulatory system: Secondary | ICD-10-CM | POA: Insufficient documentation

## 2020-07-14 DIAGNOSIS — I428 Other cardiomyopathies: Secondary | ICD-10-CM | POA: Insufficient documentation

## 2020-07-14 DIAGNOSIS — I2510 Atherosclerotic heart disease of native coronary artery without angina pectoris: Secondary | ICD-10-CM | POA: Diagnosis not present

## 2020-07-14 HISTORY — PX: AIR/FLUID EXCHANGE: SHX6494

## 2020-07-14 HISTORY — PX: INJECTION OF SILICONE OIL: SHX6422

## 2020-07-14 HISTORY — PX: PARS PLANA VITRECTOMY: SHX2166

## 2020-07-14 HISTORY — PX: PHOTOCOAGULATION WITH LASER: SHX6027

## 2020-07-14 HISTORY — DX: Presence of automatic (implantable) cardiac defibrillator: Z95.810

## 2020-07-14 LAB — GLUCOSE, CAPILLARY
Glucose-Capillary: 102 mg/dL — ABNORMAL HIGH (ref 70–99)
Glucose-Capillary: 102 mg/dL — ABNORMAL HIGH (ref 70–99)
Glucose-Capillary: 88 mg/dL (ref 70–99)

## 2020-07-14 LAB — BASIC METABOLIC PANEL
Anion gap: 6 (ref 5–15)
BUN: 20 mg/dL (ref 6–20)
CO2: 23 mmol/L (ref 22–32)
Calcium: 8.9 mg/dL (ref 8.9–10.3)
Chloride: 107 mmol/L (ref 98–111)
Creatinine, Ser: 1.37 mg/dL — ABNORMAL HIGH (ref 0.61–1.24)
GFR calc Af Amer: >60 mL/min (ref >60)
GFR calc non Af Amer: 58 mL/min — ABNORMAL LOW (ref >60)
Glucose, Bld: 115 mg/dL — ABNORMAL HIGH (ref 70–99)
Potassium: 3.7 mmol/L (ref 3.5–5.1)
Sodium: 136 mmol/L (ref 135–145)

## 2020-07-14 SURGERY — PARS PLANA VITRECTOMY WITH 25 GAUGE
Anesthesia: Monitor Anesthesia Care | Site: Eye | Laterality: Right

## 2020-07-14 SURGERY — REPAIR, RETINAL DETACHMENT, COMPLEX
Anesthesia: Monitor Anesthesia Care | Laterality: Right

## 2020-07-14 MED ORDER — NA CHONDROIT SULF-NA HYALURON 40-30 MG/ML IO SOLN
INTRAOCULAR | Status: DC | PRN
  Administered 2020-07-14: 0.5 mL via INTRAOCULAR

## 2020-07-14 MED ORDER — EPINEPHRINE PF 1 MG/ML IJ SOLN
INTRAMUSCULAR | Status: AC
  Filled 2020-07-14: qty 1

## 2020-07-14 MED ORDER — BSS IO SOLN
INTRAOCULAR | Status: DC | PRN
  Administered 2020-07-14: 15 mL via INTRAOCULAR

## 2020-07-14 MED ORDER — NA CHONDROIT SULF-NA HYALURON 40-30 MG/ML IO SOLN
INTRAOCULAR | Status: AC
  Filled 2020-07-14: qty 0.5

## 2020-07-14 MED ORDER — HYDRALAZINE HCL 20 MG/ML IJ SOLN
INTRAMUSCULAR | Status: DC | PRN
Start: 2020-07-14 — End: 2020-07-14
  Administered 2020-07-14: 10 mg via INTRAVENOUS

## 2020-07-14 MED ORDER — BUPIVACAINE HCL (PF) 0.75 % IJ SOLN
INTRAMUSCULAR | Status: AC
  Filled 2020-07-14: qty 10

## 2020-07-14 MED ORDER — EPHEDRINE 5 MG/ML INJ
INTRAVENOUS | Status: AC
  Filled 2020-07-14: qty 10

## 2020-07-14 MED ORDER — LABETALOL HCL 5 MG/ML IV SOLN
INTRAVENOUS | Status: AC
  Filled 2020-07-14: qty 4

## 2020-07-14 MED ORDER — ATROPINE SULFATE 1 % OP SOLN
OPHTHALMIC | Status: AC
  Filled 2020-07-14: qty 5

## 2020-07-14 MED ORDER — ORAL CARE MOUTH RINSE: 15.0000 mL | Freq: Once | OROMUCOSAL | Status: AC

## 2020-07-14 MED ORDER — CEFAZOLIN SUBCONJUNCTIVAL INJECTION 100 MG/0.5 ML
100.0000 mg | INJECTION | SUBCONJUNCTIVAL | Status: DC
  Filled 2020-07-14: qty 5

## 2020-07-14 MED ORDER — CEFAZOLIN SUBCONJUNCTIVAL INJECTION 100 MG/0.5 ML
INJECTION | SUBCONJUNCTIVAL | Status: DC | PRN
  Administered 2020-07-14: 100 mg via SUBCONJUNCTIVAL

## 2020-07-14 MED ORDER — LIDOCAINE HCL 2 % IJ SOLN
INTRAMUSCULAR | Status: DC | PRN
  Administered 2020-07-14: 6 mL via RETROBULBAR

## 2020-07-14 MED ORDER — LIDOCAINE 2% (20 MG/ML) 5 ML SYRINGE
INTRAMUSCULAR | Status: DC | PRN
Start: 2020-07-14 — End: 2020-07-14
  Administered 2020-07-14: 60 mg via INTRAVENOUS

## 2020-07-14 MED ORDER — ONDANSETRON HCL 4 MG/2ML IJ SOLN
INTRAMUSCULAR | Status: AC
  Filled 2020-07-14: qty 6

## 2020-07-14 MED ORDER — TOBRAMYCIN-DEXAMETHASONE 0.3-0.1 % OP OINT
TOPICAL_OINTMENT | OPHTHALMIC | Status: AC
  Filled 2020-07-14: qty 3.5

## 2020-07-14 MED ORDER — DEXAMETHASONE SODIUM PHOSPHATE 10 MG/ML IJ SOLN
INTRAMUSCULAR | Status: AC
  Filled 2020-07-14: qty 1

## 2020-07-14 MED ORDER — OFLOXACIN 0.3 % OP SOLN
1.0000 [drp] | OPHTHALMIC | Status: AC | PRN
  Administered 2020-07-14 (×3): 1 [drp] via OPHTHALMIC

## 2020-07-14 MED ORDER — LIDOCAINE HCL 3.5 % OP GEL
Freq: Once | OPHTHALMIC | Status: AC
  Administered 2020-07-14: 1 via OPHTHALMIC
  Filled 2020-07-14: qty 1

## 2020-07-14 MED ORDER — ONDANSETRON HCL 4 MG/2ML IJ SOLN
INTRAMUSCULAR | Status: AC
  Filled 2020-07-14: qty 2

## 2020-07-14 MED ORDER — LIDOCAINE HCL 2 % IJ SOLN
INTRAMUSCULAR | Status: AC
  Filled 2020-07-14: qty 20

## 2020-07-14 MED ORDER — LABETALOL HCL 5 MG/ML IV SOLN
INTRAVENOUS | Status: DC | PRN
  Administered 2020-07-14 (×2): 5 mg via INTRAVENOUS
  Administered 2020-07-14: 10 mg via INTRAVENOUS

## 2020-07-14 MED ORDER — BSS IO SOLN
INTRAOCULAR | Status: AC
  Filled 2020-07-14: qty 15

## 2020-07-14 MED ORDER — LIDOCAINE 2% (20 MG/ML) 5 ML SYRINGE
INTRAMUSCULAR | Status: AC
  Filled 2020-07-14: qty 5

## 2020-07-14 MED ORDER — SODIUM CHLORIDE 0.9 % IV SOLN: INTRAVENOUS | Status: DC

## 2020-07-14 MED ORDER — TOBRAMYCIN-DEXAMETHASONE 0.3-0.1 % OP OINT
TOPICAL_OINTMENT | OPHTHALMIC | Status: DC | PRN
  Administered 2020-07-14: 1 via OPHTHALMIC

## 2020-07-14 MED ORDER — CYCLOPENTOLATE HCL 1 % OP SOLN
1.0000 [drp] | OPHTHALMIC | Status: AC | PRN
  Administered 2020-07-14 (×3): 1 [drp] via OPHTHALMIC

## 2020-07-14 MED ORDER — HYALURONIDASE HUMAN 150 UNIT/ML IJ SOLN
INTRAMUSCULAR | Status: AC
  Filled 2020-07-14: qty 1

## 2020-07-14 MED ORDER — CHLORHEXIDINE GLUCONATE 0.12 % MT SOLN
15.0000 mL | Freq: Once | OROMUCOSAL | Status: AC
  Administered 2020-07-14: 15 mL via OROMUCOSAL

## 2020-07-14 MED ORDER — BSS PLUS IO SOLN
INTRAOCULAR | Status: AC
  Filled 2020-07-14: qty 500

## 2020-07-14 MED ORDER — ONDANSETRON HCL 4 MG/2ML IJ SOLN
INTRAMUSCULAR | Status: DC | PRN
  Administered 2020-07-14: 4 mg via INTRAVENOUS

## 2020-07-14 MED ORDER — PROPOFOL 10 MG/ML IV BOLUS
INTRAVENOUS | Status: DC | PRN
  Administered 2020-07-14: 30 mg via INTRAVENOUS
  Administered 2020-07-14: 20 mg via INTRAVENOUS

## 2020-07-14 MED ORDER — DEXAMETHASONE SODIUM PHOSPHATE 10 MG/ML IJ SOLN
INTRAMUSCULAR | Status: DC | PRN
  Administered 2020-07-14: 10 mg

## 2020-07-14 MED ORDER — PHENYLEPHRINE HCL 10 % OP SOLN
1.0000 [drp] | OPHTHALMIC | Status: AC | PRN
  Administered 2020-07-14: 1 [drp] via OPHTHALMIC

## 2020-07-14 MED ORDER — PHENYLEPHRINE HCL 2.5 % OP SOLN
1.0000 [drp] | OPHTHALMIC | Status: AC | PRN
  Administered 2020-07-14 (×3): 1 [drp] via OPHTHALMIC

## 2020-07-14 MED ORDER — HYDRALAZINE HCL 20 MG/ML IJ SOLN
INTRAMUSCULAR | Status: AC
  Filled 2020-07-14: qty 1

## 2020-07-14 MED ORDER — TROPICAMIDE 1 % OP SOLN
1.0000 [drp] | OPHTHALMIC | Status: AC | PRN
  Administered 2020-07-14 (×3): 1 [drp] via OPHTHALMIC

## 2020-07-14 MED ORDER — INDOCYANINE GREEN 25 MG IV SOLR
INTRAVENOUS | Status: AC
  Filled 2020-07-14: qty 10

## 2020-07-14 MED ORDER — EPINEPHRINE PF 1 MG/ML IJ SOLN
INTRAOCULAR | Status: DC | PRN
  Administered 2020-07-14: 500 mL

## 2020-07-14 SURGICAL SUPPLY — 70 items
APL SWBSTK 6 STRL LF DISP (MISCELLANEOUS) ×2
APPLICATOR COTTON TIP 6 STRL (MISCELLANEOUS) ×2 IMPLANT
APPLICATOR COTTON TIP 6IN STRL (MISCELLANEOUS) ×4
BAND WRIST GAS GREEN (MISCELLANEOUS) IMPLANT
BLADE MVR KNIFE 20G (BLADE) IMPLANT
BLADE STAB KNIFE 15DEG (BLADE) IMPLANT
CABLE BIPOLOR RESECTION CORD (MISCELLANEOUS) ×3 IMPLANT
CANNULA ANT CHAM MAIN (OPHTHALMIC RELATED) IMPLANT
CANNULA DUAL BORE 23G (CANNULA) IMPLANT
CANNULA DUALBORE 25G (CANNULA) IMPLANT
CANNULA VLV SOFT TIP 25G (OPHTHALMIC) ×1 IMPLANT
CANNULA VLV SOFT TIP 25GA (OPHTHALMIC) ×4 IMPLANT
CAUTERY EYE LOW TEMP 1300F FIN (OPHTHALMIC RELATED) IMPLANT
CLOSURE STERI-STRIP 1/2X4 (GAUZE/BANDAGES/DRESSINGS) ×1
CLSR STERI-STRIP ANTIMIC 1/2X4 (GAUZE/BANDAGES/DRESSINGS) ×3 IMPLANT
COVER MAYO STAND STRL (DRAPES) IMPLANT
DRAPE HALF SHEET 40X57 (DRAPES) ×4 IMPLANT
DRAPE INCISE 51X51 W/FILM STRL (DRAPES) ×3 IMPLANT
DRAPE RETRACTOR (MISCELLANEOUS) ×4 IMPLANT
ERASER HMR WETFIELD 23G BP (MISCELLANEOUS) IMPLANT
FORCEPS ECKARDT ILM 25G SERR (OPHTHALMIC RELATED) IMPLANT
FORCEPS GRIESHABER ILM 25G A (INSTRUMENTS) ×3 IMPLANT
GAS AUTO FILL CONSTEL (OPHTHALMIC) ×4
GAS AUTO FILL CONSTELLATION (OPHTHALMIC) ×1 IMPLANT
GAS WRIST BAND GREEN (MISCELLANEOUS)
GLOVE SURG SYN 7.5  E (GLOVE) ×4
GLOVE SURG SYN 7.5 E (GLOVE) ×2 IMPLANT
GLOVE SURG SYN 7.5 PF PI (GLOVE) ×1 IMPLANT
GOWN STRL REUS W/ TWL LRG LVL3 (GOWN DISPOSABLE) ×2 IMPLANT
GOWN STRL REUS W/TWL LRG LVL3 (GOWN DISPOSABLE) ×4
KIT BASIN OR (CUSTOM PROCEDURE TRAY) ×4 IMPLANT
KIT TURNOVER KIT B (KITS) ×4 IMPLANT
LENS BIOM SUPER VIEW SET DISP (MISCELLANEOUS) ×4 IMPLANT
MICROPICK 25G (MISCELLANEOUS)
NDL 18GX1X1/2 (RX/OR ONLY) (NEEDLE) ×1 IMPLANT
NDL 25GX 5/8IN NON SAFETY (NEEDLE) ×1 IMPLANT
NDL FILTER BLUNT 18X1 1/2 (NEEDLE) ×1 IMPLANT
NDL HYPO 25GX1X1/2 BEV (NEEDLE) IMPLANT
NDL HYPO 30X.5 LL (NEEDLE) ×2 IMPLANT
NDL RETROBULBAR 25GX1.5 (NEEDLE) ×1 IMPLANT
NEEDLE 18GX1X1/2 (RX/OR ONLY) (NEEDLE) ×4 IMPLANT
NEEDLE 25GX 5/8IN NON SAFETY (NEEDLE) ×4 IMPLANT
NEEDLE FILTER BLUNT 18X 1/2SAF (NEEDLE) ×2
NEEDLE FILTER BLUNT 18X1 1/2 (NEEDLE) ×2 IMPLANT
NEEDLE HYPO 25GX1X1/2 BEV (NEEDLE) IMPLANT
NEEDLE HYPO 30X.5 LL (NEEDLE) ×8 IMPLANT
NEEDLE RETROBULBAR 25GX1.5 (NEEDLE) ×4 IMPLANT
NS IRRIG 1000ML POUR BTL (IV SOLUTION) ×4 IMPLANT
OIL SILICONE OPHTHALMIC 1000 (Ophthalmic Related) ×3 IMPLANT
PACK FRAGMATOME (OPHTHALMIC) IMPLANT
PACK VITRECTOMY CUSTOM (CUSTOM PROCEDURE TRAY) ×4 IMPLANT
PAD ARMBOARD 7.5X6 YLW CONV (MISCELLANEOUS) ×8 IMPLANT
PAK PIK VITRECTOMY CVS 25GA (OPHTHALMIC) ×4 IMPLANT
PIC ILLUMINATED 25G (OPHTHALMIC) ×4
PICK MICROPICK 25G (MISCELLANEOUS) IMPLANT
PIK ILLUMINATED 25G (OPHTHALMIC) ×1 IMPLANT
PROBE ENDO DIATHERMY 25G (MISCELLANEOUS) IMPLANT
PROBE LASER ILLUM FLEX CVD 25G (OPHTHALMIC) ×3 IMPLANT
SCRAPER DIAMOND 25GA (OPHTHALMIC RELATED) ×3 IMPLANT
SOL ANTI FOG 6CC (MISCELLANEOUS) ×2 IMPLANT
SOLUTION ANTI FOG 6CC (MISCELLANEOUS) ×2
STOPCOCK 4 WAY LG BORE MALE ST (IV SETS) ×3 IMPLANT
SUT VICRYL 7 0 TG140 8 (SUTURE) ×4 IMPLANT
SUT VICRYL 8 0 TG140 8 (SUTURE) IMPLANT
SYR 10ML LL (SYRINGE) IMPLANT
SYR 20ML LL LF (SYRINGE) ×4 IMPLANT
SYR 5ML LL (SYRINGE) ×4 IMPLANT
SYR TB 1ML LUER SLIP (SYRINGE) ×3 IMPLANT
WATER STERILE IRR 1000ML POUR (IV SOLUTION) ×4 IMPLANT
WIPE INSTRUMENT VISIWIPE 73X73 (MISCELLANEOUS) ×3 IMPLANT

## 2020-07-14 NOTE — Brief Op Note (Signed)
07/14/2020  6:03 PM  PATIENT:  Jeffrey [name]  54 y.o. male  PRE-OPERATIVE DIAGNOSIS:  Traction Retinal Detatchment  POST-OPERATIVE DIAGNOSIS:  Traction Retinal Detatchment  PROCEDURE:  Procedure(s): PARS PLANA VITRECTOMY WITH 25 GAUGE, Membranetomy, drainage of subretinal fluid (Right) INJECTION OF SILICONE OIL (Right) AIR/FLUID EXCHANGE (Right) PHOTOCOAGULATION WITH LASER (Right)  SURGEON:  Surgeon(s) and Role:    * Jalene Mullet, MD - Primary  PHYSICIAN ASSISTANT:   ASSISTANTS: none   ANESTHESIA:   local and MAC  EBL:  minimal   BLOOD ADMINISTERED:none  DRAINS: none   LOCAL MEDICATIONS USED:  MARCAINE    and LIDOCAINE   SPECIMEN:  No Specimen  DISPOSITION OF SPECIMEN:  N/A  COUNTS:  YES  TOURNIQUET:  * No tourniquets in log *  DICTATION: .Note written in EPIC  PLAN OF CARE: Discharge to home after PACU  PATIENT DISPOSITION:  PACU - hemodynamically stable.   Delay start of Pharmacological VTE agent (>24hrs) due to surgical blood loss or risk of bleeding: not applicable

## 2020-07-14 NOTE — Anesthesia Preprocedure Evaluation (Addendum)
Anesthesia Evaluation  Patient identified by MRN, date of birth, ID band Patient awake    Reviewed: Allergy & Precautions, NPO status , [name] Chart, lab work & pertinent test results  Airway Mallampati: III  TM Distance: >3 FB Neck ROM: Full    Dental  (+) Teeth Intact, Dental Advisory Given, Missing,    Pulmonary     + decreased breath sounds      Cardiovascular hypertension, + CAD and +CHF  + Cardiac Defibrillator  Rhythm:Regular Rate:Normal     Neuro/Psych negative neurological ROS  negative psych ROS   GI/Hepatic negative GI ROS, Neg liver ROS,   Endo/Other  diabetes  Renal/GU Renal disease     Musculoskeletal negative musculoskeletal ROS (+)   Abdominal Normal abdominal exam  (+)   Peds  Hematology negative hematology ROS (+)   Anesthesia Other Findings   Reproductive/Obstetrics                            Anesthesia Physical Anesthesia Plan  ASA: IV  Anesthesia Plan:    Post-op Pain Management:    Induction:   PONV Risk Score and Plan:   Airway Management Planned:   Additional Equipment:   Intra-op Plan:   Post-operative Plan:   Informed Consent:   Plan Discussed with:   Anesthesia Plan Comments: (Echo:  1. Left ventricular ejection fraction, by estimation, is 30 to 35%. The  left ventricle has moderate to severely decreased function. The left  ventricle demonstrates global hypokinesis. There is mild left ventricular  hypertrophy. Left ventricular  diastolic parameters are consistent with Grade I diastolic dysfunction  (impaired relaxation).  2. Right ventricular systolic function is normal. The right ventricular  size is normal. Tricuspid regurgitation signal is inadequate for assessing  PA pressure.  3. The mitral valve is normal in structure. Trivial mitral valve  regurgitation. No evidence of mitral stenosis.  4. The aortic valve is tricuspid.  Aortic valve regurgitation is not  visualized. Mild aortic valve sclerosis is present, with no evidence of  aortic valve stenosis.  5. The inferior vena cava is normal in size with greater than 50%  respiratory variability, suggesting right atrial pressure of 3 mmHg. )        Anesthesia Quick Evaluation

## 2020-07-14 NOTE — Anesthesia Procedure Notes (Signed)
Procedure Name: MAC Date/Time: 07/14/2020 4:15 PM Performed by: Candis Shine, CRNA Pre-anesthesia Checklist: Patient identified, Emergency Drugs available, Suction available and Patient being monitored Patient Re-evaluated:Patient Re-evaluated prior to induction Oxygen Delivery Method: Nasal cannula Dental Injury: Teeth and Oropharynx as per pre-operative assessment

## 2020-07-14 NOTE — Op Note (Signed)
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  A lid speculum was placed.  Infusion line and trocar was placed at the 8 o'clock position approximately 3.5 mm from the surgical limbus.   The infusion line was allowed to run and then clamped when placed at the cannula opening. The line was inserted and secured to the drape with an adhesive strip.   Active trocars/cannula were placed at the 10 and 2 o'clock positions approximately 3.5 mm from the surgical limbus. The cannula was visualized in the vitreous cavity.  The light pipe and vitreous cutter were inserted into the vitreous cavity and a core vitrectomy was performed.  Care taken to remove the vitreous attachments to the posterior hyaloid.  Care taken to remove the vitreous up to the vitreous base for 360 degrees.   Attention was directed toward relieving the tractional detachment from the posterior pole in particular peripherally (nasally, inferiorly and at the disk). This was done carefully at the disc and surrounding arcades. There was notable neovascular fronds with traction and significant gliosis inferior to the macula.  There was also associated hemorrhage inferiorly. Care was taken to elevate the membranes and remove them both with a vitrector and a lighted pick, membrane forceps and the vitrector.  Hemostasis of the neovascular fronds was performed with endocautery and endolaser. Following hemostasis, continued dissection of membranes and removal of membranes was performed including the  superior and inferior retina. Membranes causing striae over the macula were relieve with the Tano scratcher.  Due to the significant gliosis of the inferior neovascular membrane two breaks in the retina were noted.  Due to the traction, gliosis and retinal elevation, a decision to place a silicone oil tamponade was made.  Endolaser was applied to the areas where the neovascular fronds were still present.  3 rows of endolaser were applied 360 degrees to the periphery.  A complete air-fluid exchange was then performed and additional endolaser was applied.  4098 centistoke silicone oil was placed in the eye. The trocars were sequentially removed and all were noted to be sealed. Subconjunctival injections of Ancef and Decadron were placed.   The speculum and drapes were removed and the eye was patched with Polymixin/Bacitracin ophthalmic ointment. An eye shield was placed and the patient was transferred alert and conversant with stable vital signs to the post operative recovery area.  The patient tolerated the procedure well and no complications were noted.  Royston Cowper MD

## 2020-07-14 NOTE — Transfer of Care (Signed)
Immediate Anesthesia Transfer of Care Note  Patient: Jeffrey [name]  Procedure(s) Performed: PARS PLANA VITRECTOMY WITH 25 GAUGE, Membranetomy, drainage of subretinal fluid (Right Eye) INJECTION OF SILICONE OIL (Right Eye) AIR/FLUID EXCHANGE (Right Eye) PHOTOCOAGULATION WITH LASER (Right Eye)  Patient Location: PACU  Anesthesia Type:MAC  Level of Consciousness: drowsy and patient cooperative  Airway & Oxygen Therapy: Patient Spontanous Breathing  Post-op Assessment: Report given to RN and Post -op Vital signs reviewed and stable  Post vital signs: Reviewed and stable  Last Vitals:  Vitals Value Taken Time  BP 130/91 07/14/20 1749  Temp    Pulse 73 07/14/20 1749  Resp 20 07/14/20 1749  SpO2 99 % 07/14/20 1749  Vitals shown include unvalidated device data.  Last Pain:  Vitals:   07/14/20 1324  TempSrc:   PainSc: 6       Patients Stated Pain Goal: 6 (33/00/76 2263)  Complications: No complications documented.

## 2020-07-14 NOTE — H&P (Signed)
Date of examination:  07/14/20  Indication for surgery: tractional retinal detachment right eye  Pertinent past medical history:  Past Medical History:  Diagnosis Date  . AICD (automatic cardioverter/defibrillator) present    Medtronic  . CAD (coronary artery disease)    a. cath 01/31/17: 60% 1st RPLB, 60% dist RCA, 55% prox RCA, 10% pro LAD --> Rx TX.   Marland Kitchen Chronic systolic CHF (congestive heart failure) ([place]) 01/28/2017   1. Echo 01/29/17:  EF 20-25, normal wall motion, mild LAE // 2. EF 10-15 by [facility] 01/2017   . Diabetes mellitus    type II  . Diabetic foot infection ([place]) 03/2016   RT FOOT  . Dyspnea   . History of kidney stones    passed  . HTN (hypertension)   . Hyperlipidemia   . NICM (nonischemic cardiomyopathy) ([place]) 02/15/2017   1. Mod non-obs CAD on LHC in 01/2017 - CAD does not explain cardiomyopathy    Pertinent ocular history:  Severe proliferative diabetic retinopathy with acute vision loss  Pertinent family history:  Family History  Problem Relation Age of Onset  . Diabetes Mother   . Hypertension Mother   . Diabetes Father   . Heart attack Father   . Diabetes Sister   . Heart attack Maternal Grandmother     General:  Healthy appearing patient in no distress.    Eyes:    Acuity OD HM   External: Within normal limits     Anterior segment: shallow AC    Fundus:No View - B-scan - tractional retinal detachment   Impression: Tractional retinal detachment right eye  Plan: Complex retinal detachment repair right eye  Jalene Mullet, MD

## 2020-07-15 ENCOUNTER — Inpatient Hospital Stay: Payer: Medicaid Other | Admitting: Family Medicine

## 2020-07-15 ENCOUNTER — Encounter (HOSPITAL_COMMUNITY): Payer: Self-pay | Admitting: Ophthalmology

## 2020-07-15 NOTE — Anesthesia Postprocedure Evaluation (Signed)
Anesthesia Post Note  Patient: Jeffrey [name]  Procedure(s) Performed: PARS PLANA VITRECTOMY WITH 25 GAUGE, Membranetomy, drainage of subretinal fluid (Right Eye) INJECTION OF SILICONE OIL (Right Eye) AIR/FLUID EXCHANGE (Right Eye) PHOTOCOAGULATION WITH LASER (Right Eye)     Patient location during evaluation: PACU Anesthesia Type: MAC Level of consciousness: awake and alert Pain management: pain level controlled Vital Signs Assessment: post-procedure vital signs reviewed and stable Respiratory status: spontaneous breathing, nonlabored ventilation, respiratory function stable and patient connected to nasal cannula oxygen Cardiovascular status: stable and blood pressure returned to baseline Postop Assessment: no apparent nausea or vomiting Anesthetic complications: no   No complications documented.  Last Vitals:  Vitals:   07/14/20 1750 07/14/20 1805  BP: (!) 130/91 136/83  Pulse: 71 71  Resp: (!) 22 17  Temp: 36.6 C   SpO2: 99% 99%    Last Pain:  Vitals:   07/14/20 1750  TempSrc:   PainSc: Jeffrey [name]

## 2020-07-16 NOTE — Telephone Encounter (Signed)
Spoke with pt. He denies sending transmission, states his grandson may have sent it while playing with the monitor. Pt denies any CHF symptoms or cardiac concerns at this time. He is appreciative of call and aware to call back with concerns.

## 2020-07-27 ENCOUNTER — Other Ambulatory Visit: Payer: Self-pay | Admitting: Internal Medicine

## 2020-07-27 ENCOUNTER — Other Ambulatory Visit: Payer: Self-pay | Admitting: Family Medicine

## 2020-07-27 DIAGNOSIS — I110 Hypertensive heart disease with heart failure: Secondary | ICD-10-CM

## 2020-07-27 DIAGNOSIS — I5022 Chronic systolic (congestive) heart failure: Secondary | ICD-10-CM

## 2020-07-27 DIAGNOSIS — R059 Cough, unspecified: Secondary | ICD-10-CM

## 2020-07-27 DIAGNOSIS — R05 Cough: Secondary | ICD-10-CM

## 2020-07-27 MED FILL — LANTUS SOLOSTAR 100 UNITS/M: 100 | 25 days supply | Qty: 15 | Fill #0

## 2020-07-27 MED FILL — GABAPENTIN 400 MG CAPSULE: 400 | 30 days supply | Qty: 90 | Fill #1

## 2020-07-27 MED FILL — CETIRIZINE HCL 10 MG TABS: 10 | 30 days supply | Qty: 30 | Fill #0

## 2020-07-27 MED FILL — NovoLOG 100 UNIT/ML SOLN: 100 | 22 days supply | Qty: 10 | Fill #2

## 2020-07-27 MED FILL — FUROSEMIDE 40 MG TAB: 40 | 30 days supply | Qty: 45 | Fill #0

## 2020-07-29 ENCOUNTER — Telehealth: Payer: Self-pay

## 2020-07-29 NOTE — Telephone Encounter (Signed)
**Note De-Identified Clotilda Hafer Obfuscation** I started a Entresto PA through covermymeds. Key: [id number]

## 2020-07-30 MED FILL — ENTRESTO 49 MG-51 MG TABLET: 49-51 | 30 days supply | Qty: 60 | Fill #0

## 2020-08-19 ENCOUNTER — Inpatient Hospital Stay: Payer: Medicaid Other | Admitting: Family Medicine

## 2020-08-24 ENCOUNTER — Encounter: Payer: Self-pay | Admitting: Internal Medicine

## 2020-08-24 ENCOUNTER — Other Ambulatory Visit: Payer: Self-pay | Admitting: Family Medicine

## 2020-08-24 ENCOUNTER — Encounter (HOSPITAL_COMMUNITY): Payer: Self-pay | Admitting: Ophthalmology

## 2020-08-24 ENCOUNTER — Other Ambulatory Visit: Payer: Self-pay

## 2020-08-24 ENCOUNTER — Other Ambulatory Visit (HOSPITAL_COMMUNITY)
Admission: RE | Admit: 2020-08-24 | Discharge: 2020-08-24 | Disposition: A | Discharge status: Home / Self Care | Payer: Medicare Other | Source: Ambulatory Visit | Attending: Ophthalmology | Admitting: Ophthalmology

## 2020-08-24 DIAGNOSIS — Z20822 Contact with and (suspected) exposure to covid-19: Secondary | ICD-10-CM | POA: Diagnosis not present

## 2020-08-24 DIAGNOSIS — Z01812 Encounter for preprocedural laboratory examination: Secondary | ICD-10-CM | POA: Insufficient documentation

## 2020-08-24 DIAGNOSIS — I110 Hypertensive heart disease with heart failure: Secondary | ICD-10-CM

## 2020-08-24 DIAGNOSIS — E785 Hyperlipidemia, unspecified: Secondary | ICD-10-CM

## 2020-08-24 LAB — SARS CORONAVIRUS 2 (TAT 6-24 HRS): SARS Coronavirus 2: NEGATIVE

## 2020-08-24 MED FILL — SPIRONOLACTONE 25 MG TABLET: 25 | 30 days supply | Qty: 30 | Fill #0

## 2020-08-24 MED FILL — ROSUVASTATIN CALCIUM 40 MG: 40 | 90 days supply | Qty: 90 | Fill #0

## 2020-08-24 MED FILL — POTASSIUM CHLORIDE ER 10 ME: 10 | 30 days supply | Qty: 30 | Fill #0

## 2020-08-24 NOTE — Progress Notes (Signed)
PERIOPERATIVE PRESCRIPTION FOR IMPLANTED CARDIAC DEVICE PROGRAMMING  Patient Information: Name:  Jeffrey [name]  DOB:  Mar 28, 1966  MRN:  [id number]   Planned Procedure: Repair of Hemorragic Retinal; Detachment  Surgeon: Dr. Sammie Bench  Date of Procedure: 08/25/2020  Cautery will be used.  Position during surgery: supine   Please send documentation back to:  [facility] (Fax # [phone])   Device Information:  Clinic EP Physician:  Virl Axe, MD   Device Type:  Defibrillator Manufacturer and Phone #:  Medtronic: [phone] Pacemaker Dependent?:  No. Date of Last Device Check:  07/01/2020 Normal Device Function?:  Yes.    Electrophysiologist's Recommendations:   Have magnet available.  Provide continuous ECG monitoring when magnet is used or reprogramming is to be performed.   Procedure may interfere with device function.  Magnet should be placed over device during procedure.  Per Device Clinic Standing Orders, Rosalyn Charters, RN  3:29 PM 08/24/2020

## 2020-08-24 NOTE — Progress Notes (Addendum)
Mr. Jeffrey [name] denies chest pain or shortness of breath.  Patient was tested for Covid today and has been in quarantine since that time.    Jeffrey [name] has type II diabetes, fasting CBGs run 78- 120. I instructed Jeffrey [name] that patie should take 22 units of Lantus Insulin tonight, in am if CBG is greater than 70 he should take 22 units of Lantus.  I instructed patient to check CBG after awaking and every 2 hours until arrival  to the hospital.  I Instructed patient if CBG is less than 70 to take 4 Glucose Tablets or 1 tube of Glucose Gel. Recheck CBG in 15 minutes then call pre- op desk at [phone] for further instructions. If scheduled to receive Insulin, do not take Insulin

## 2020-08-25 ENCOUNTER — Inpatient Hospital Stay (HOSPITAL_COMMUNITY): Payer: Medicare Other | Admitting: Certified Registered Nurse Anesthetist

## 2020-08-25 ENCOUNTER — Encounter (HOSPITAL_COMMUNITY): Payer: Self-pay | Admitting: Ophthalmology

## 2020-08-25 ENCOUNTER — Ambulatory Visit (HOSPITAL_COMMUNITY)
Admission: RE | Admit: 2020-08-25 | Discharge: 2020-08-25 | Disposition: A | Discharge status: Home / Self Care | Payer: Medicare Other | Attending: Ophthalmology | Admitting: Ophthalmology

## 2020-08-25 ENCOUNTER — Encounter (HOSPITAL_COMMUNITY)
Admission: RE | Disposition: A | Discharge status: Home / Self Care | Payer: Self-pay | Source: Home / Self Care | Attending: Ophthalmology

## 2020-08-25 DIAGNOSIS — Z833 Family history of diabetes mellitus: Secondary | ICD-10-CM | POA: Diagnosis not present

## 2020-08-25 DIAGNOSIS — H3341 Traction detachment of retina, right eye: Secondary | ICD-10-CM | POA: Diagnosis present

## 2020-08-25 DIAGNOSIS — I428 Other cardiomyopathies: Secondary | ICD-10-CM | POA: Diagnosis not present

## 2020-08-25 DIAGNOSIS — Z95810 Presence of automatic (implantable) cardiac defibrillator: Secondary | ICD-10-CM | POA: Diagnosis not present

## 2020-08-25 DIAGNOSIS — E113591 Type 2 diabetes mellitus with proliferative diabetic retinopathy without macular edema, right eye: Secondary | ICD-10-CM | POA: Insufficient documentation

## 2020-08-25 DIAGNOSIS — I2510 Atherosclerotic heart disease of native coronary artery without angina pectoris: Secondary | ICD-10-CM | POA: Insufficient documentation

## 2020-08-25 DIAGNOSIS — E785 Hyperlipidemia, unspecified: Secondary | ICD-10-CM | POA: Diagnosis not present

## 2020-08-25 DIAGNOSIS — I110 Hypertensive heart disease with heart failure: Secondary | ICD-10-CM | POA: Diagnosis not present

## 2020-08-25 DIAGNOSIS — I5022 Chronic systolic (congestive) heart failure: Secondary | ICD-10-CM | POA: Diagnosis not present

## 2020-08-25 DIAGNOSIS — Z8249 Family history of ischemic heart disease and other diseases of the circulatory system: Secondary | ICD-10-CM | POA: Diagnosis not present

## 2020-08-25 HISTORY — PX: INJECTION OF SILICONE OIL: SHX6422

## 2020-08-25 HISTORY — PX: SILICON OIL REMOVAL: SHX5305

## 2020-08-25 HISTORY — PX: PARS PLANA VITRECTOMY: SHX2166

## 2020-08-25 HISTORY — PX: REPAIR OF COMPLEX TRACTION RETINAL DETACHMENT: SHX6217

## 2020-08-25 HISTORY — PX: MEMBRANE PEEL: SHX5967

## 2020-08-25 HISTORY — PX: PHOTOCOAGULATION WITH LASER: SHX6027

## 2020-08-25 LAB — GLUCOSE, CAPILLARY
Glucose-Capillary: 137 mg/dL — ABNORMAL HIGH (ref 70–99)
Glucose-Capillary: 43 mg/dL — CL (ref 70–99)
Glucose-Capillary: 93 mg/dL (ref 70–99)
Glucose-Capillary: 115 mg/dL — ABNORMAL HIGH (ref 70–99)
Glucose-Capillary: 72 mg/dL (ref 70–99)
Glucose-Capillary: 77 mg/dL (ref 70–99)
Glucose-Capillary: 90 mg/dL (ref 70–99)

## 2020-08-25 LAB — BASIC METABOLIC PANEL
Anion gap: 10 (ref 5–15)
BUN: 17 mg/dL (ref 6–20)
CO2: 25 mmol/L (ref 22–32)
Calcium: 9.5 mg/dL (ref 8.9–10.3)
Chloride: 105 mmol/L (ref 98–111)
Creatinine, Ser: 1.32 mg/dL — ABNORMAL HIGH (ref 0.61–1.24)
GFR calc Af Amer: >60 mL/min (ref >60)
GFR calc non Af Amer: >60 mL/min (ref >60)
Glucose, Bld: 48 mg/dL — ABNORMAL LOW (ref 70–99)
Potassium: 3 mmol/L — ABNORMAL LOW (ref 3.5–5.1)
Sodium: 140 mmol/L (ref 135–145)

## 2020-08-25 SURGERY — REPAIR, RETINAL DETACHMENT, COMPLEX
Anesthesia: General | Site: Eye | Laterality: Right

## 2020-08-25 MED ORDER — DEXTROSE 50 % IV SOLN
INTRAVENOUS | Status: AC
  Filled 2020-08-25: qty 50

## 2020-08-25 MED ORDER — LIDOCAINE 2% (20 MG/ML) 5 ML SYRINGE
INTRAMUSCULAR | Status: DC | PRN
  Administered 2020-08-25: 40 mg via INTRAVENOUS

## 2020-08-25 MED ORDER — PHENYLEPHRINE HCL 2.5 % OP SOLN
1.0000 [drp] | OPHTHALMIC | Status: AC | PRN
  Administered 2020-08-25 (×2): 1 [drp] via OPHTHALMIC

## 2020-08-25 MED ORDER — MIDAZOLAM HCL 2 MG/2ML IJ SOLN
INTRAMUSCULAR | Status: AC
  Filled 2020-08-25: qty 2

## 2020-08-25 MED ORDER — PROPARACAINE HCL 0.5 % OP SOLN
OPHTHALMIC | Status: AC
  Administered 2020-08-25: 1 [drp] via OPHTHALMIC
  Filled 2020-08-25: qty 15

## 2020-08-25 MED ORDER — PROPARACAINE HCL 0.5 % OP SOLN
1.0000 [drp] | OPHTHALMIC | Status: AC | PRN
  Administered 2020-08-25 (×2): 1 [drp] via OPHTHALMIC

## 2020-08-25 MED ORDER — OFLOXACIN 0.3 % OP SOLN
OPHTHALMIC | Status: AC
  Administered 2020-08-25: 1 [drp] via OPHTHALMIC
  Filled 2020-08-25: qty 5

## 2020-08-25 MED ORDER — SODIUM CHLORIDE 0.9 % IV SOLN: INTRAVENOUS | Status: DC

## 2020-08-25 MED ORDER — CHLORHEXIDINE GLUCONATE 0.12 % MT SOLN
OROMUCOSAL | Status: AC
  Administered 2020-08-25: 15 mL via OROMUCOSAL
  Filled 2020-08-25: qty 15

## 2020-08-25 MED ORDER — TROPICAMIDE 1 % OP SOLN
1.0000 [drp] | OPHTHALMIC | Status: AC | PRN
  Administered 2020-08-25 (×2): 1 [drp] via OPHTHALMIC

## 2020-08-25 MED ORDER — TOBRAMYCIN-DEXAMETHASONE 0.3-0.1 % OP OINT
TOPICAL_OINTMENT | OPHTHALMIC | Status: AC
  Filled 2020-08-25: qty 3.5

## 2020-08-25 MED ORDER — CYCLOPENTOLATE HCL 1 % OP SOLN
OPHTHALMIC | Status: AC
  Administered 2020-08-25: 1 [drp] via OPHTHALMIC
  Filled 2020-08-25: qty 2

## 2020-08-25 MED ORDER — ONDANSETRON HCL 4 MG/2ML IJ SOLN
INTRAMUSCULAR | Status: AC
  Filled 2020-08-25: qty 2

## 2020-08-25 MED ORDER — ROCURONIUM BROMIDE 10 MG/ML (PF) SYRINGE
PREFILLED_SYRINGE | INTRAVENOUS | Status: AC
  Filled 2020-08-25: qty 10

## 2020-08-25 MED ORDER — FENTANYL CITRATE (PF) 250 MCG/5ML IJ SOLN
INTRAMUSCULAR | Status: DC | PRN
Start: 2020-08-25 — End: 2020-08-25
  Administered 2020-08-25: 50 ug via INTRAVENOUS

## 2020-08-25 MED ORDER — TROPICAMIDE 1 % OP SOLN
OPHTHALMIC | Status: AC
  Administered 2020-08-25: 1 [drp] via OPHTHALMIC
  Filled 2020-08-25: qty 15

## 2020-08-25 MED ORDER — PROPOFOL 10 MG/ML IV BOLUS
INTRAVENOUS | Status: DC | PRN
  Administered 2020-08-25: 100 mg via INTRAVENOUS
  Administered 2020-08-25: 30 mg via INTRAVENOUS
  Administered 2020-08-25: 20 mg via INTRAVENOUS

## 2020-08-25 MED ORDER — BSS IO SOLN
INTRAOCULAR | Status: AC
  Filled 2020-08-25: qty 15

## 2020-08-25 MED ORDER — FENTANYL CITRATE (PF) 100 MCG/2ML IJ SOLN: 25.0000 ug | INTRAMUSCULAR | Status: DC | PRN

## 2020-08-25 MED ORDER — BUPIVACAINE HCL (PF) 0.75 % IJ SOLN
INTRAMUSCULAR | Status: AC
  Filled 2020-08-25: qty 10

## 2020-08-25 MED ORDER — DEXAMETHASONE SODIUM PHOSPHATE 10 MG/ML IJ SOLN
INTRAMUSCULAR | Status: AC
  Filled 2020-08-25: qty 1

## 2020-08-25 MED ORDER — LACTATED RINGERS IV SOLN: INTRAVENOUS | Status: DC

## 2020-08-25 MED ORDER — MEPERIDINE HCL 25 MG/ML IJ SOLN: 6.2500 mg | INTRAMUSCULAR | Status: DC | PRN

## 2020-08-25 MED ORDER — LIDOCAINE HCL 2 % IJ SOLN
INTRAMUSCULAR | Status: DC | PRN
  Administered 2020-08-25: 6 mL via RETROBULBAR

## 2020-08-25 MED ORDER — BSS IO SOLN
INTRAOCULAR | Status: DC | PRN
  Administered 2020-08-25: 15 mL via INTRAOCULAR

## 2020-08-25 MED ORDER — TOBRAMYCIN-DEXAMETHASONE 0.3-0.1 % OP OINT
TOPICAL_OINTMENT | OPHTHALMIC | Status: DC | PRN
  Administered 2020-08-25: 1 via OPHTHALMIC

## 2020-08-25 MED ORDER — CYCLOPENTOLATE HCL 1 % OP SOLN
1.0000 [drp] | OPHTHALMIC | Status: AC | PRN
  Administered 2020-08-25 (×2): 1 [drp] via OPHTHALMIC

## 2020-08-25 MED ORDER — DEXTROSE 50 % IV SOLN
INTRAVENOUS | Status: AC
  Administered 2020-08-25: 50 mL via INTRAVENOUS
  Filled 2020-08-25: qty 50

## 2020-08-25 MED ORDER — DEXAMETHASONE SODIUM PHOSPHATE 10 MG/ML IJ SOLN
INTRAMUSCULAR | Status: DC | PRN
  Administered 2020-08-25: 5 mg via INTRAVENOUS

## 2020-08-25 MED ORDER — INDOCYANINE GREEN 25 MG IV SOLR
INTRAVENOUS | Status: AC
  Filled 2020-08-25: qty 10

## 2020-08-25 MED ORDER — HYALURONIDASE HUMAN 150 UNIT/ML IJ SOLN
INTRAMUSCULAR | Status: AC
  Filled 2020-08-25: qty 1

## 2020-08-25 MED ORDER — ACETAMINOPHEN 10 MG/ML IV SOLN: 1000.0000 mg | Freq: Once | INTRAVENOUS | Status: DC | PRN

## 2020-08-25 MED ORDER — HYPROMELLOSE (GONIOSCOPIC) 2.5 % OP SOLN
OPHTHALMIC | Status: DC | PRN
  Administered 2020-08-25: 2 [drp] via OPHTHALMIC

## 2020-08-25 MED ORDER — LIDOCAINE HCL 2 % IJ SOLN
INTRAMUSCULAR | Status: AC
  Filled 2020-08-25: qty 20

## 2020-08-25 MED ORDER — AMISULPRIDE (ANTIEMETIC) 5 MG/2ML IV SOLN: 10.0000 mg | Freq: Once | INTRAVENOUS | Status: DC | PRN

## 2020-08-25 MED ORDER — DEXTROSE 50 % IV SOLN
INTRAVENOUS | Status: DC | PRN
  Administered 2020-08-25 (×2): 12.5 g via INTRAVENOUS

## 2020-08-25 MED ORDER — EPINEPHRINE PF 1 MG/ML IJ SOLN
INTRAOCULAR | Status: DC | PRN
  Administered 2020-08-25: 500 mL

## 2020-08-25 MED ORDER — HYALURONIDASE HUMAN 150 UNIT/ML IJ SOLN: INTRAMUSCULAR | Status: DC | PRN

## 2020-08-25 MED ORDER — DEXTROSE 50 % IV SOLN
50.0000 mL | Freq: Once | INTRAVENOUS | Status: AC
  Filled 2020-08-25: qty 50

## 2020-08-25 MED ORDER — PHENYLEPHRINE HCL 2.5 % OP SOLN
OPHTHALMIC | Status: AC
  Administered 2020-08-25: 1 [drp] via OPHTHALMIC
  Filled 2020-08-25: qty 2

## 2020-08-25 MED ORDER — SUGAMMADEX SODIUM 200 MG/2ML IV SOLN
INTRAVENOUS | Status: DC | PRN
  Administered 2020-08-25: 200 mg via INTRAVENOUS

## 2020-08-25 MED ORDER — PHENYLEPHRINE HCL-NACL 10-0.9 MG/250ML-% IV SOLN
INTRAVENOUS | Status: DC | PRN
  Administered 2020-08-25: 25 ug/min via INTRAVENOUS

## 2020-08-25 MED ORDER — EPINEPHRINE PF 1 MG/ML IJ SOLN
INTRAMUSCULAR | Status: AC
  Filled 2020-08-25: qty 1

## 2020-08-25 MED ORDER — DEXAMETHASONE SODIUM PHOSPHATE 10 MG/ML IJ SOLN
INTRAMUSCULAR | Status: DC | PRN
  Administered 2020-08-25: 10 mg

## 2020-08-25 MED ORDER — ATROPINE SULFATE 1 % OP SOLN
OPHTHALMIC | Status: AC
  Filled 2020-08-25: qty 5

## 2020-08-25 MED ORDER — PHENYLEPHRINE 40 MCG/ML (10ML) SYRINGE FOR IV PUSH (FOR BLOOD PRESSURE SUPPORT)
PREFILLED_SYRINGE | INTRAVENOUS | Status: DC | PRN
  Administered 2020-08-25 (×5): 80 ug via INTRAVENOUS

## 2020-08-25 MED ORDER — PROPOFOL 10 MG/ML IV BOLUS
INTRAVENOUS | Status: AC
  Filled 2020-08-25: qty 20

## 2020-08-25 MED ORDER — ACETAMINOPHEN 160 MG/5ML PO SOLN: 325.0000 mg | Freq: Once | ORAL | Status: DC | PRN

## 2020-08-25 MED ORDER — BSS PLUS IO SOLN
INTRAOCULAR | Status: AC
  Filled 2020-08-25: qty 500

## 2020-08-25 MED ORDER — FENTANYL CITRATE (PF) 250 MCG/5ML IJ SOLN
INTRAMUSCULAR | Status: AC
  Filled 2020-08-25: qty 5

## 2020-08-25 MED ORDER — TRYPAN BLUE 0.15 % OP SOLN
OPHTHALMIC | Status: DC | PRN
  Administered 2020-08-25: 0.5 mL via INTRAVITREAL

## 2020-08-25 MED ORDER — CHLORHEXIDINE GLUCONATE 0.12 % MT SOLN: 15.0000 mL | Freq: Once | OROMUCOSAL | Status: AC

## 2020-08-25 MED ORDER — NA CHONDROIT SULF-NA HYALURON 40-30 MG/ML IO SOLN
INTRAOCULAR | Status: AC
  Filled 2020-08-25: qty 0.5

## 2020-08-25 MED ORDER — OFLOXACIN 0.3 % OP SOLN
1.0000 [drp] | OPHTHALMIC | Status: AC | PRN
  Administered 2020-08-25 (×2): 1 [drp] via OPHTHALMIC

## 2020-08-25 MED ORDER — ROCURONIUM BROMIDE 100 MG/10ML IV SOLN
INTRAVENOUS | Status: DC | PRN
  Administered 2020-08-25: 40 mg via INTRAVENOUS
  Administered 2020-08-25: 10 mg via INTRAVENOUS
  Administered 2020-08-25 (×2): 20 mg via INTRAVENOUS

## 2020-08-25 MED ORDER — CEFAZOLIN SUBCONJUNCTIVAL INJECTION 100 MG/0.5 ML
INJECTION | SUBCONJUNCTIVAL | Status: DC | PRN
  Administered 2020-08-25: 100 mg via SUBCONJUNCTIVAL

## 2020-08-25 MED ORDER — HYPROMELLOSE (GONIOSCOPIC) 2.5 % OP SOLN
OPHTHALMIC | Status: AC
  Filled 2020-08-25: qty 15

## 2020-08-25 MED ORDER — LIDOCAINE 2% (20 MG/ML) 5 ML SYRINGE
INTRAMUSCULAR | Status: AC
  Filled 2020-08-25: qty 5

## 2020-08-25 MED ORDER — ACETAMINOPHEN 325 MG PO TABS: 325.0000 mg | ORAL_TABLET | Freq: Once | ORAL | Status: DC | PRN

## 2020-08-25 MED ORDER — CEFAZOLIN SUBCONJUNCTIVAL INJECTION 100 MG/0.5 ML
100.0000 mg | INJECTION | SUBCONJUNCTIVAL | Status: DC
  Filled 2020-08-25: qty 5

## 2020-08-25 MED ORDER — ORAL CARE MOUTH RINSE: 15.0000 mL | Freq: Once | OROMUCOSAL | Status: AC

## 2020-08-25 SURGICAL SUPPLY — 74 items
APL SWBSTK 6 STRL LF DISP (MISCELLANEOUS) ×1
APPLICATOR COTTON TIP 6 STRL (MISCELLANEOUS) ×1 IMPLANT
APPLICATOR COTTON TIP 6IN STRL (MISCELLANEOUS) ×3
BAND WRIST GAS GREEN (MISCELLANEOUS) IMPLANT
BLADE MVR KNIFE 20G (BLADE) IMPLANT
BLADE STAB KNIFE 15DEG (BLADE) IMPLANT
BNDG EYE OVAL (GAUZE/BANDAGES/DRESSINGS) ×2 IMPLANT
CABLE BIPOLOR RESECTION CORD (MISCELLANEOUS) ×2 IMPLANT
CANNULA ANT CHAM MAIN (OPHTHALMIC RELATED) IMPLANT
CANNULA DUAL BORE 23G (CANNULA) IMPLANT
CANNULA DUALBORE 25G (CANNULA) IMPLANT
CANNULA VLV SOFT TIP 25G (OPHTHALMIC) ×1 IMPLANT
CANNULA VLV SOFT TIP 25GA (OPHTHALMIC) ×3 IMPLANT
CAUTERY EYE LOW TEMP 1300F FIN (OPHTHALMIC RELATED) IMPLANT
CLOSURE STERI-STRIP 1/2X4 (GAUZE/BANDAGES/DRESSINGS) ×1
CLSR STERI-STRIP ANTIMIC 1/2X4 (GAUZE/BANDAGES/DRESSINGS) ×2 IMPLANT
COVER MAYO STAND STRL (DRAPES) ×2 IMPLANT
DRAPE HALF SHEET 40X57 (DRAPES) ×3 IMPLANT
DRAPE INCISE 51X51 W/FILM STRL (DRAPES) ×2 IMPLANT
DRAPE RETRACTOR (MISCELLANEOUS) ×3 IMPLANT
ERASER HMR WETFIELD 23G BP (MISCELLANEOUS) IMPLANT
FORCEPS ECKARDT ILM 25G SERR (OPHTHALMIC RELATED) IMPLANT
FORCEPS GRIESHABER ILM 25G A (INSTRUMENTS) ×2 IMPLANT
GAS AUTO FILL CONSTEL (OPHTHALMIC)
GAS AUTO FILL CONSTELLATION (OPHTHALMIC) IMPLANT
GAS WRIST BAND GREEN (MISCELLANEOUS)
GLOVE SURG SYN 7.5  E (GLOVE) ×3
GLOVE SURG SYN 7.5 E (GLOVE) ×1 IMPLANT
GLOVE SURG SYN 7.5 PF PI (GLOVE) ×1 IMPLANT
GOWN STRL REUS W/ TWL LRG LVL3 (GOWN DISPOSABLE) ×1 IMPLANT
GOWN STRL REUS W/TWL LRG LVL3 (GOWN DISPOSABLE) ×3
KIT BASIN OR (CUSTOM PROCEDURE TRAY) ×3 IMPLANT
KIT TURNOVER KIT B (KITS) ×3 IMPLANT
LENS BIOM SUPER VIEW SET DISP (MISCELLANEOUS) ×3 IMPLANT
MICROPICK 25G (MISCELLANEOUS)
NDL 18GX1X1/2 (RX/OR ONLY) (NEEDLE) ×1 IMPLANT
NDL 25GX 5/8IN NON SAFETY (NEEDLE) ×1 IMPLANT
NDL FILTER BLUNT 18X1 1/2 (NEEDLE) ×1 IMPLANT
NDL HYPO 25GX1X1/2 BEV (NEEDLE) IMPLANT
NDL HYPO 30X.5 LL (NEEDLE) ×2 IMPLANT
NDL RETROBULBAR 25GX1.5 (NEEDLE) ×1 IMPLANT
NEEDLE 18GX1X1/2 (RX/OR ONLY) (NEEDLE) ×3 IMPLANT
NEEDLE 25GX 5/8IN NON SAFETY (NEEDLE) ×3 IMPLANT
NEEDLE FILTER BLUNT 18X 1/2SAF (NEEDLE) ×2
NEEDLE FILTER BLUNT 18X1 1/2 (NEEDLE) ×1 IMPLANT
NEEDLE HYPO 25GX1X1/2 BEV (NEEDLE) ×3 IMPLANT
NEEDLE HYPO 30X.5 LL (NEEDLE) ×9 IMPLANT
NEEDLE RETROBULBAR 25GX1.5 (NEEDLE) ×3 IMPLANT
NS IRRIG 1000ML POUR BTL (IV SOLUTION) ×3 IMPLANT
OIL SILICONE OPHTHALMIC 1000 (Ophthalmic Related) ×2 IMPLANT
PACK FRAGMATOME (OPHTHALMIC) IMPLANT
PACK VITRECTOMY CUSTOM (CUSTOM PROCEDURE TRAY) ×3 IMPLANT
PAD ARMBOARD 7.5X6 YLW CONV (MISCELLANEOUS) ×6 IMPLANT
PAK PIK VITRECTOMY CVS 25GA (OPHTHALMIC) ×3 IMPLANT
PIC ILLUMINATED 25G (OPHTHALMIC) ×3
PICK MICROPICK 25G (MISCELLANEOUS) IMPLANT
PIK ILLUMINATED 25G (OPHTHALMIC) IMPLANT
PROBE ENDO DIATHERMY 25G (MISCELLANEOUS) ×2 IMPLANT
PROBE LASER ILLUM FLEX CVD 25G (OPHTHALMIC) ×2 IMPLANT
ROLLS DENTAL (MISCELLANEOUS) IMPLANT
SCRAPER DIAMOND 25GA (OPHTHALMIC RELATED) IMPLANT
SET INJECTOR OIL FLUID CONSTEL (OPHTHALMIC) ×4 IMPLANT
SHIELD EYE LENSE ONLY DISP (GAUZE/BANDAGES/DRESSINGS) ×2 IMPLANT
SOL ANTI FOG 6CC (MISCELLANEOUS) ×1 IMPLANT
SOLUTION ANTI FOG 6CC (MISCELLANEOUS) ×2
STOPCOCK 4 WAY LG BORE MALE ST (IV SETS) IMPLANT
SUT VICRYL 7 0 TG140 8 (SUTURE) ×1 IMPLANT
SUT VICRYL 8 0 TG140 8 (SUTURE) IMPLANT
SYR 10ML LL (SYRINGE) IMPLANT
SYR 20ML LL LF (SYRINGE) ×3 IMPLANT
SYR 5ML LL (SYRINGE) ×3 IMPLANT
SYR TB 1ML LUER SLIP (SYRINGE) IMPLANT
WATER STERILE IRR 1000ML POUR (IV SOLUTION) ×3 IMPLANT
WIPE INSTRUMENT VISIWIPE 73X73 (MISCELLANEOUS) IMPLANT

## 2020-08-25 NOTE — Op Note (Signed)
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  A lid speculum was placed.  Infusion line and trocar was placed at the 8 o'clock position approximately 3.5 mm from the surgical limbus.   The infusion line was allowed to run and then clamped when placed at the cannula opening. The line was inserted and secured to the drape with an adhesive strip.   Active trocars/cannula were placed at the 10 and 2 o'clock positions approximately 3.5 mm from the surgical limbus. The cannula was visualized in the vitreous cavity.  The viscous fluid extractor was used to remove silicone oil.  The light pipe and vitreous cutter were inserted into the vitreous cavity and the remaining silicone oil was removed.  Dehemoglobinized blood was also removed from the eye.  Whitened clot was carefully vitrectomized.  The subretinal hemorrhage was carefully removed.  Proliferative vitreoretinopathy was noted and membranectomy was performed.  Membrane blue dye was injected over the macula and irrigated free after 1 minute.  The ILM was carefully elevated and removed over the temporal macula where the gliosis and detachment was greatest.  Subretinal blood and associated fibrosis was carefully dissected with the vitrector and illuminated pick.    A complete air fluid exchange was performed and the retina flattened.  Silicone oil was then placed in the eye to achieve a near complete oil fill.  The trocars were sequentially removed.  Normal  intraocular pressure was noted by digital palpapation.  Subconjunctival injection of Vancomycin and Dexamethasone 4mg /20ml was placed.   The speculum and drapes were removed and the eye was patched with Polymixin/Bacitracin ophthalmic ointment. An eye shield was placed and the patient was transferred alert and conversant with stable vital signs to the post operative recovery area.  The patient tolerated the procedure well and no complications were noted.  Royston Cowper MD

## 2020-08-25 NOTE — H&P (Signed)
Date of examination:  08/25/20  Indication for surgery: hemorrhagic retinal detachment right eye  Pertinent past medical history:  Past Medical History:  Diagnosis Date  . AICD (automatic cardioverter/defibrillator) present    Medtronic  . CAD (coronary artery disease)    a. cath 01/31/17: 60% 1st RPLB, 60% dist RCA, 55% prox RCA, 10% pro LAD --> Rx TX.   Marland Kitchen Chronic systolic CHF (congestive heart failure) ([place]) 01/28/2017   1. Echo 01/29/17:  EF 20-25, normal wall motion, mild LAE // 2. EF 10-15 by [facility] 01/2017   . Diabetes mellitus    type II  . Diabetic foot infection ([place]) 03/2016   RT FOOT  . Dyspnea   . History of kidney stones    passed  . HTN (hypertension)   . Hyperlipidemia   . NICM (nonischemic cardiomyopathy) ([place]) 02/15/2017   1. Mod non-obs CAD on LHC in 01/2017 - CAD does not explain cardiomyopathy    Pertinent ocular history:  Proliferative diabetic retinopathy  Pertinent family history:  Family History  Problem Relation Age of Onset  . Diabetes Mother   . Hypertension Mother   . Diabetes Father   . Heart attack Father   . Diabetes Sister   . Heart attack Maternal Grandmother     General:  Healthy appearing patient in no distress.   Eyes:    Acuity OD CF   External: Within normal limits      Anterior segment: Within normal limits         Fundus: hemorrhagic retinal detachment under silicone oil right eye        Impression: Hemorrhagic retinal detachment with retained silicone oil right eye  Plan:  Silicone oil removal right eye with hemorrhagic retinal detachment repair   Jalene Mullet, MD

## 2020-08-25 NOTE — Brief Op Note (Signed)
08/25/2020  6:05 PM  PATIENT:  Jeffrey [name]  54 y.o. male  PRE-OPERATIVE DIAGNOSIS:  Right Eye Hemorrhagic Retinal Detachment  POST-OPERATIVE DIAGNOSIS:  Right Eye Hemorrhagic Retinal Detachment with proliferative retinopathy  PROCEDURE:  Procedure(s): REPAIR OF HEMORRHAGIC DETACHMENT (Right) SILICONE OIL REMOVAL (Right) PARS PLANA VITRECTOMY WITH 25 GAUGE (Right) MEMBRANE PEEL (Right) MEMBRANECTOMY (Right) AIR FLUID EXCHANGE (Right) INJECTION OF SILICONE OIL (Right) PHOTOCOAGULATION WITH LASER (Right)  SURGEON:  Surgeon(s) and Role:    Jalene Mullet, MD - Primary  PHYSICIAN ASSISTANT:   ASSISTANTS: none   ANESTHESIA:  General and local  EBL:  minimal   BLOOD ADMINISTERED:none  DRAINS: none   LOCAL MEDICATIONS USED:  MARCAINE    and LIDOCAINE   SPECIMEN:  No Specimen  DISPOSITION OF SPECIMEN:  N/A  COUNTS:  YES  TOURNIQUET:  * No tourniquets in log *  DICTATION: .Note written in EPIC  PLAN OF CARE: Discharge to home after PACU  PATIENT DISPOSITION:  PACU - hemodynamically stable.   Delay start of Pharmacological VTE agent (>24hrs) due to surgical blood loss or risk of bleeding: not applicable

## 2020-08-25 NOTE — Progress Notes (Signed)
Hypoglycemic Event  CBG: 1351  Treatment: D50 50 mL (25 gm)  Symptoms: Pale  Follow-up CBG: Time:14:18 CBG Result: 137  Possible Reasons for Event: NPO and took insulin without checking blood sugar this morning.   Comments/MD notified:     Kreg Shropshire

## 2020-08-25 NOTE — Discharge Instructions (Signed)
DO NOT SLEEP ON BACK, THE EYE PRESSURE CAN GO UP AND CAUSE VISION LOSS   SLEEP ON SIDE WITH NOSE TO PILLOW  DURING DAY KEEP UPRIGHT 

## 2020-08-25 NOTE — Anesthesia Postprocedure Evaluation (Signed)
Anesthesia Post Note  Patient: Jeffrey [name]  Procedure(s) Performed: REPAIR OF HEMORRHAGIC DETACHMENT (Right Eye) SILICON OIL REMOVAL (Right Eye) PARS PLANA VITRECTOMY WITH 25 GAUGE (Right Eye) MEMBRANE PEEL (Right Eye) INJECTION OF SILICONE OIL (Right Eye) PHOTOCOAGULATION WITH LASER (Right Eye)     Patient location during evaluation: PACU Anesthesia Type: General Level of consciousness: awake and alert Pain management: pain level controlled Vital Signs Assessment: post-procedure vital signs reviewed and stable Respiratory status: spontaneous breathing, nonlabored ventilation and respiratory function stable Cardiovascular status: blood pressure returned to baseline and stable Postop Assessment: no apparent nausea or vomiting Anesthetic complications: no   No complications documented.  Last Vitals:  Vitals:   08/25/20 1845 08/25/20 1859  BP: (!) 143/95 (!) 147/93  Pulse: 76 79  Resp: 20 (!) 22  Temp:  36.4 C  SpO2: 96% 94%    Last Pain:  Vitals:   08/25/20 1859  TempSrc:   PainSc: 0-No pain                 Maythe Deramo,W. EDMOND

## 2020-08-25 NOTE — Transfer of Care (Signed)
Immediate Anesthesia Transfer of Care Note  Patient: Jeffrey [name]  Procedure(s) Performed: REPAIR OF HEMORRHAGIC DETACHMENT (Right Eye) SILICON OIL REMOVAL (Right Eye) PARS PLANA VITRECTOMY WITH 25 GAUGE (Right Eye) MEMBRANE PEEL (Right Eye) INJECTION OF SILICONE OIL (Right Eye) PHOTOCOAGULATION WITH LASER (Right Eye)  Patient Location: PACU  Anesthesia Type:General  Level of Consciousness: awake and patient cooperative  Airway & Oxygen Therapy: Patient Spontanous Breathing  Post-op Assessment: Report given to RN  Post vital signs: Reviewed and stable  Last Vitals:  Vitals Value Taken Time  BP 147/92 08/25/20 1812  Temp    Pulse 69 08/25/20 1814  Resp 20 08/25/20 1814  SpO2 95 % 08/25/20 1814  Vitals shown include unvalidated device data.  Last Pain:  Vitals:   08/25/20 1424  TempSrc:   PainSc: 0-No pain      Patients Stated Pain Goal: 3 (58/30/74 6002)  Complications: No complications documented.

## 2020-08-25 NOTE — Anesthesia Preprocedure Evaluation (Addendum)
Anesthesia Evaluation  Patient identified by MRN, date of birth, ID band Patient awake    Reviewed: Allergy & Precautions, NPO status , [name] Chart, lab work & pertinent test results  Airway Mallampati: III  TM Distance: >3 FB Neck ROM: Full    Dental  (+) Teeth Intact, Dental Advisory Given, Missing,    Pulmonary     + decreased breath sounds      Cardiovascular hypertension, + CAD and +CHF  + Cardiac Defibrillator  Rhythm:Regular Rate:Normal     Neuro/Psych negative neurological ROS  negative psych ROS   GI/Hepatic negative GI ROS, Neg liver ROS,   Endo/Other  diabetes  Renal/GU Renal disease     Musculoskeletal negative musculoskeletal ROS (+)   Abdominal Normal abdominal exam  (+)   Peds  Hematology negative hematology ROS (+)   Anesthesia Other Findings   Reproductive/Obstetrics                             Anesthesia Physical  Anesthesia Plan  ASA: IV  Anesthesia Plan: MAC and General   Post-op Pain Management:    Induction: Intravenous  PONV Risk Score and Plan: 2 and Propofol infusion, Ondansetron and Midazolam  Airway Management Planned: Natural Airway, Simple Face Mask, Oral ETT and Mask  Additional Equipment: None  Intra-op Plan:   Post-operative Plan: Extubation in OR  Informed Consent: I have reviewed the patients History and Physical, chart, labs and discussed the procedure including the risks, benefits and alternatives for the proposed anesthesia with the patient or authorized representative who has indicated his/her understanding and acceptance.       Plan Discussed with: CRNA  Anesthesia Plan Comments: (Echo:  1. Left ventricular ejection fraction, by estimation, is 30 to 35%. The  left ventricle has moderate to severely decreased function. The left  ventricle demonstrates global hypokinesis. There is mild left ventricular  hypertrophy. Left  ventricular  diastolic parameters are consistent with Grade I diastolic dysfunction  (impaired relaxation).  2. Right ventricular systolic function is normal. The right ventricular  size is normal. Tricuspid regurgitation signal is inadequate for assessing  PA pressure.  3. The mitral valve is normal in structure. Trivial mitral valve  regurgitation. No evidence of mitral stenosis.  4. The aortic valve is tricuspid. Aortic valve regurgitation is not  visualized. Mild aortic valve sclerosis is present, with no evidence of  aortic valve stenosis.  5. The inferior vena cava is normal in size with greater than 50%  respiratory variability, suggesting right atrial pressure of 3 mmHg. )       Anesthesia Quick Evaluation

## 2020-08-25 NOTE — Anesthesia Procedure Notes (Signed)
Procedure Name: Intubation Date/Time: 08/25/2020 4:17 PM Performed by: Janene Harvey, CRNA Pre-anesthesia Checklist: Patient identified, Emergency Drugs available, Suction available and Patient being monitored Patient Re-evaluated:Patient Re-evaluated prior to induction Oxygen Delivery Method: Circle system utilized Preoxygenation: Pre-oxygenation with 100% oxygen Induction Type: IV induction Ventilation: Oral airway inserted - appropriate to patient size and Two handed mask ventilation required Laryngoscope Size: Mac and 4 Grade View: Grade II Tube type: Oral Tube size: 7.5 mm Number of attempts: 1 Airway Equipment and Method: Stylet and Oral airway Placement Confirmation: ETT inserted through vocal cords under direct vision,  positive ETCO2 and breath sounds checked- equal and bilateral Secured at: 23 cm Tube secured with: Tape Dental Injury: Teeth and Oropharynx as per pre-operative assessment

## 2020-08-26 ENCOUNTER — Encounter (HOSPITAL_COMMUNITY): Payer: Self-pay | Admitting: Ophthalmology

## 2020-08-28 MED FILL — PREDNISOLONE AC 1% EYE DROP: 1 | 33 days supply | Qty: 5 | Fill #0

## 2020-08-28 MED FILL — OFLOXACIN 0.3% EYE DROPS: 0.3 | 25 days supply | Qty: 5 | Fill #0

## 2020-08-28 MED FILL — NovoLOG 100 UNIT/ML SOLN: 100 | 22 days supply | Qty: 10 | Fill #3

## 2020-08-28 MED FILL — LANTUS SOLOSTAR 100 UNITS/M: 100 | 25 days supply | Qty: 15 | Fill #1

## 2020-09-08 NOTE — Telephone Encounter (Signed)
**Note De-Identified Stran Raper Obfuscation** I called [facility] [employer] to f/u on this Foster PA. I s/w Patty who advised me that the PA was approved on 07/29/2020 and is valid until 07/24/2021. PA#: [id number]

## 2020-09-15 MED FILL — LANTUS SOLOSTAR 100 UNITS/M: 100 | 25 days supply | Qty: 15 | Fill #1

## 2020-09-15 MED FILL — NovoLOG 100 UNIT/ML SOLN: 100 | 22 days supply | Qty: 10 | Fill #3

## 2020-09-18 ENCOUNTER — Ambulatory Visit: Payer: Self-pay

## 2020-09-18 ENCOUNTER — Other Ambulatory Visit: Payer: Self-pay | Admitting: Internal Medicine

## 2020-09-18 DIAGNOSIS — E1165 Type 2 diabetes mellitus with hyperglycemia: Secondary | ICD-10-CM

## 2020-09-18 MED ORDER — LANTUS SOLOSTAR 100 UNIT/ML ~~LOC~~ SOPN: 60.0000 [IU] | PEN_INJECTOR | Freq: Every day | SUBCUTANEOUS | 0 refills | Status: DC

## 2020-09-18 MED ORDER — INSULIN ASPART 100 UNIT/ML ~~LOC~~ SOLN: 0.0000 [IU] | Freq: Three times a day (TID) | SUBCUTANEOUS | 0 refills | Status: DC

## 2020-09-18 NOTE — Telephone Encounter (Signed)
Patient called because he missed his pick up of his insulin today. Called office and transferred call to them for refill.  Reason for Disposition . [1] Caller requesting NON-URGENT health information AND [2] PCP's office is the best resource  Answer Assessment - Initial Assessment Questions 1. REASON FOR CALL or QUESTION: "What is your reason for calling today?" or "How can I best help you?" or "What question do you have that I can help answer?"    Patient needs insulin. Called office and spoke with them . Call transferred to them  Protocols used: [place]

## 2020-09-21 ENCOUNTER — Inpatient Hospital Stay: Payer: Medicare Other | Admitting: Family Medicine

## 2020-09-30 ENCOUNTER — Ambulatory Visit (INDEPENDENT_AMBULATORY_CARE_PROVIDER_SITE_OTHER): Payer: Medicare Other

## 2020-09-30 DIAGNOSIS — I428 Other cardiomyopathies: Secondary | ICD-10-CM

## 2020-10-02 LAB — CUP PACEART REMOTE DEVICE CHECK
Battery Remaining Longevity: 107 mo
Battery Voltage: 3.01 V
Brady Statistic RV Percent Paced: 0.01 %
Date Time Interrogation Session: 20211020022603
HighPow Impedance: 59 Ohm
Implantable Lead Implant Date: 20190204
Implantable Lead Location: 753860
Implantable Pulse Generator Implant Date: 20190204
Lead Channel Impedance Value: 323 Ohm
Lead Channel Impedance Value: 399 Ohm
Lead Channel Pacing Threshold Amplitude: 1.25 V
Lead Channel Pacing Threshold Pulse Width: 0.4 ms
Lead Channel Sensing Intrinsic Amplitude: 11.375 mV
Lead Channel Sensing Intrinsic Amplitude: 11.375 mV
Lead Channel Setting Pacing Amplitude: 2.5 V
Lead Channel Setting Pacing Pulse Width: 0.4 ms
Lead Channel Setting Sensing Sensitivity: 0.3 mV

## 2020-10-06 NOTE — Progress Notes (Signed)
Remote ICD transmission.   

## 2020-10-08 ENCOUNTER — Ambulatory Visit: Payer: Medicare Other | Admitting: Family Medicine

## 2020-10-29 ENCOUNTER — Other Ambulatory Visit: Payer: Self-pay | Admitting: Internal Medicine

## 2020-10-29 ENCOUNTER — Other Ambulatory Visit: Payer: Self-pay | Admitting: Family Medicine

## 2020-10-29 DIAGNOSIS — R059 Cough, unspecified: Secondary | ICD-10-CM

## 2020-10-29 DIAGNOSIS — I110 Hypertensive heart disease with heart failure: Secondary | ICD-10-CM

## 2020-10-29 MED ORDER — ENTRESTO 49-51 MG PO TABS: 1.0000 | ORAL_TABLET | Freq: Two times a day (BID) | ORAL | 0 refills | Status: DC

## 2020-10-29 MED FILL — POTASSIUM CHLORIDE ER 10 ME: 10 | 30 days supply | Qty: 30 | Fill #1

## 2020-10-29 MED FILL — SPIRONOLACTONE 25 MG TABLET: 25 | 30 days supply | Qty: 30 | Fill #1

## 2020-10-29 NOTE — Telephone Encounter (Signed)
Requested medication (s) are due for refill today:no  Requested medication (s) are on the active medication list: yes  Last refill:  05/26/2020  Future visit scheduled: no  Notes to clinic:  overdue for follow up appt Vm left for patient to callback and schedule   Requested Prescriptions  Pending Prescriptions Disp Refills   carvedilol (COREG) 12.5 MG tablet [Pharmacy Med Name: CARVEDILOL 12.5 MG TABLET 12.5 Tablet] 60 tablet 3    Sig: Take 1 tablet (12.5 mg total) by mouth 2 (two) times daily with a meal.      Cardiovascular:  Beta Blockers Failed - 10/29/2020  8:26 AM      Failed - Last BP in normal range    BP Readings from Last 1 Encounters:  08/25/20 (!) 147/93          Failed - Valid encounter within last 6 months    Recent Outpatient Visits           9 months ago Screening for colon cancer   [place], [place], MD   1 year ago Chronic combined systolic (congestive) and diastolic (congestive) heart failure ([place])   [place], Royal Kunia, MD   1 year ago Uncontrolled type 2 diabetes mellitus with hyperglycemia, with long-term current use of insulin ([place])   [place], Vincent, MD   1 year ago Chronic combined systolic (congestive) and diastolic (congestive) heart failure ([place])   [place], [place], MD   2 years ago Type 2 diabetes mellitus with other specified complication, with long-term current use of insulin ([place])   [place], [place], MD              Passed - Last Heart Rate in normal range    Pulse Readings from Last 1 Encounters:  08/25/20 79            cetirizine (ZYRTEC) 10 MG tablet [Pharmacy Med Name: CETIRIZINE HCL 10 MG TABS 10 Tablet] 30 tablet 0    Sig: TAKE 1 TABLET (10 MG TOTAL) BY MOUTH DAILY.      Ear, Nose, and Throat:  Antihistamines Passed -  10/29/2020  8:26 AM      Passed - Valid encounter within last 12 months    Recent Outpatient Visits           9 months ago Screening for colon cancer   [place], [place], MD   1 year ago Chronic combined systolic (congestive) and diastolic (congestive) heart failure ([place])   [place], Charlane Ferretti, MD   1 year ago Uncontrolled type 2 diabetes mellitus with hyperglycemia, with long-term current use of insulin ([place])   [place], Charlane Ferretti, MD   1 year ago Chronic combined systolic (congestive) and diastolic (congestive) heart failure ([place])   [place], Charlane Ferretti, MD   2 years ago Type 2 diabetes mellitus with other specified complication, with long-term current use of insulin ([place])   [place], [place], MD                furosemide (LASIX) 40 MG tablet [Pharmacy Med Name: FUROSEMIDE 40 MG TAB 40 Tablet] 45 tablet 0    Sig: TAKE 1.5 TABLETS (60 MG TOTAL) BY MOUTH DAILY.  Cardiovascular:  Diuretics - Loop Failed - 10/29/2020  8:26 AM      Failed - K in normal range and within 360 days    Potassium  Date Value Ref Range Status  08/25/2020 3.0 (L) 3.5 - 5.1 mmol/L Final          Failed - Cr in normal range and within 360 days    Creat  Date Value Ref Range Status  02/15/2017 0.91 0.70 - 1.33 mg/dL Final    Comment:      For patients > or = 55 years of age: The upper reference limit for Creatinine is approximately 13% higher for people identified as African-American.      Creatinine, Ser  Date Value Ref Range Status  08/25/2020 1.32 (H) 0.61 - 1.24 mg/dL Final   Creatinine, Urine  Date Value Ref Range Status  06/13/2020 281.91 mg/dL Final          Failed - Last BP in normal range    BP Readings from Last 1 Encounters:  08/25/20 (!) 147/93          Failed - Valid encounter  within last 6 months    Recent Outpatient Visits           9 months ago Screening for colon cancer   [place], [place], MD   1 year ago Chronic combined systolic (congestive) and diastolic (congestive) heart failure ([place])   [place], Tiburones, MD   1 year ago Uncontrolled type 2 diabetes mellitus with hyperglycemia, with long-term current use of insulin ([place])   [place], [place], MD   1 year ago Chronic combined systolic (congestive) and diastolic (congestive) heart failure ([place])   [place], Adeline, MD   2 years ago Type 2 diabetes mellitus with other specified complication, with long-term current use of insulin ([place])   [place], [place], MD              Passed - Ca in normal range and within 360 days    Calcium  Date Value Ref Range Status  08/25/2020 9.5 8.9 - 10.3 mg/dL Final   Calcium, Ion  Date Value Ref Range Status  06/12/2020 1.24 1.15 - 1.40 mmol/L Final          Passed - Na in normal range and within 360 days    Sodium  Date Value Ref Range Status  08/25/2020 140 135 - 145 mmol/L Final  04/25/2019 139 134 - 144 mmol/L Final

## 2020-10-30 MED FILL — ENTRESTO 49 MG-51 MG TABLET: 49-51 | 30 days supply | Qty: 30 | Fill #0

## 2020-11-03 ENCOUNTER — Other Ambulatory Visit: Payer: Self-pay | Admitting: Family Medicine

## 2020-11-03 DIAGNOSIS — E1165 Type 2 diabetes mellitus with hyperglycemia: Secondary | ICD-10-CM

## 2020-11-03 NOTE — Telephone Encounter (Signed)
Requested medication (s) are due for refill today: yes  Requested medication (s) are on the active medication list: yes  Last refill:  09/15/2020  Future visit scheduled: no  Notes to clinic:  overdue for office visit Vm left for patient to callback and schedule    Requested Prescriptions  Pending Prescriptions Disp Refills   NOVOLOG 100 UNIT/ML injection [Pharmacy Med Name: NovoLOG 100 UNIT/ML SOLN 100 Solution] 10 mL 3    Sig: INJECT 0-15 UNITS INTO THE SKIN 3 (THREE) TIMES DAILY WITH MEALS. SLIDING SCALE CBG 70 - 120: 0 UNITS: CBG 121 - 140: 2 UNITS; CBG 140 - 200:8 UNITS      Endocrinology:  Diabetes - Insulins Failed - 11/03/2020  3:01 PM      Failed - HBA1C is between 0 and 7.9 and within 180 days    Hgb A1c MFr Bld  Date Value Ref Range Status  06/13/2020 15.4 (H) 4.8 - 5.6 % Final    Comment:    (NOTE) Pre diabetes:          5.7%-6.4%  Diabetes:              >6.4%  Glycemic control for   <7.0% adults with diabetes           Failed - Valid encounter within last 6 months    Recent Outpatient Visits           10 months ago Screening for colon cancer   [place], [place], MD   1 year ago Chronic combined systolic (congestive) and diastolic (congestive) heart failure ([place])   [place], [place], MD   1 year ago Uncontrolled type 2 diabetes mellitus with hyperglycemia, with long-term current use of insulin ([place])   [place], [place], MD   1 year ago Chronic combined systolic (congestive) and diastolic (congestive) heart failure ([place])   [place], Charlane Ferretti, MD   2 years ago Type 2 diabetes mellitus with other specified complication, with long-term current use of insulin ([place])   [facility] Charlott Rakes, MD

## 2020-11-11 ENCOUNTER — Other Ambulatory Visit: Payer: Self-pay | Admitting: Family Medicine

## 2020-11-11 DIAGNOSIS — I110 Hypertensive heart disease with heart failure: Secondary | ICD-10-CM

## 2020-11-11 MED FILL — FUROSEMIDE 40 MG TAB: 40 | 30 days supply | Qty: 45 | Fill #0

## 2020-11-11 MED FILL — CARVEDILOL 12.5 MG TABLET: 12.5 | 30 days supply | Qty: 60 | Fill #0

## 2020-11-16 ENCOUNTER — Other Ambulatory Visit: Payer: Self-pay | Admitting: Family Medicine

## 2020-11-16 DIAGNOSIS — E1165 Type 2 diabetes mellitus with hyperglycemia: Secondary | ICD-10-CM

## 2020-11-16 DIAGNOSIS — Z794 Long term (current) use of insulin: Secondary | ICD-10-CM

## 2020-11-16 MED FILL — NovoLOG 100 UNIT/ML SOLN: 100 | 22 days supply | Qty: 10 | Fill #0

## 2020-11-16 MED FILL — LANTUS SOLOSTAR 100 UNITS/M: 100 | 25 days supply | Qty: 15 | Fill #2

## 2020-11-26 ENCOUNTER — Telehealth: Payer: Self-pay

## 2020-11-26 NOTE — Telephone Encounter (Signed)
Disability paperwork has been received.

## 2020-12-15 ENCOUNTER — Encounter: Payer: Medicare Other | Admitting: Student

## 2020-12-21 ENCOUNTER — Encounter (HOSPITAL_COMMUNITY): Payer: Self-pay

## 2020-12-21 ENCOUNTER — Other Ambulatory Visit: Payer: Self-pay

## 2020-12-21 ENCOUNTER — Emergency Department (HOSPITAL_COMMUNITY)
Admission: EM | Admit: 2020-12-21 | Discharge: 2020-12-22 | Disposition: A | Discharge status: Home / Self Care | Payer: Medicare Other | Attending: Emergency Medicine | Admitting: Emergency Medicine

## 2020-12-21 DIAGNOSIS — E1165 Type 2 diabetes mellitus with hyperglycemia: Secondary | ICD-10-CM | POA: Diagnosis not present

## 2020-12-21 DIAGNOSIS — Z7982 Long term (current) use of aspirin: Secondary | ICD-10-CM | POA: Diagnosis not present

## 2020-12-21 DIAGNOSIS — Z79899 Other long term (current) drug therapy: Secondary | ICD-10-CM | POA: Diagnosis not present

## 2020-12-21 DIAGNOSIS — U071 COVID-19: Secondary | ICD-10-CM

## 2020-12-21 DIAGNOSIS — Z794 Long term (current) use of insulin: Secondary | ICD-10-CM | POA: Insufficient documentation

## 2020-12-21 DIAGNOSIS — I5041 Acute combined systolic (congestive) and diastolic (congestive) heart failure: Secondary | ICD-10-CM | POA: Insufficient documentation

## 2020-12-21 DIAGNOSIS — I110 Hypertensive heart disease with heart failure: Secondary | ICD-10-CM | POA: Insufficient documentation

## 2020-12-21 DIAGNOSIS — M791 Myalgia, unspecified site: Secondary | ICD-10-CM

## 2020-12-21 DIAGNOSIS — R059 Cough, unspecified: Secondary | ICD-10-CM | POA: Diagnosis present

## 2020-12-21 LAB — CBG MONITORING, ED: Glucose-Capillary: 246 mg/dL — ABNORMAL HIGH (ref 70–99)

## 2020-12-21 LAB — RESP PANEL BY RT-PCR (FLU A&B, COVID) ARPGX2
Influenza A by PCR: NEGATIVE
Influenza B by PCR: NEGATIVE
SARS Coronavirus 2 by RT PCR: POSITIVE — AB

## 2020-12-21 MED ORDER — ACETAMINOPHEN 325 MG PO TABS
650.0000 mg | ORAL_TABLET | Freq: Once | ORAL | Status: AC
  Administered 2020-12-21: 15:00:00 650 mg via ORAL
  Filled 2020-12-21: qty 2

## 2020-12-21 NOTE — ED Triage Notes (Signed)
Pt presents with all over body aches and cough x1 day. Pt also states he's a diabetic and has not checked his sugar

## 2020-12-22 ENCOUNTER — Telehealth: Payer: Self-pay | Admitting: *Deleted

## 2020-12-22 ENCOUNTER — Other Ambulatory Visit (HOSPITAL_COMMUNITY): Payer: Self-pay | Admitting: Physician Assistant

## 2020-12-22 LAB — CBC
HCT: 40.2 % (ref 39.0–52.0)
Hemoglobin: 12.5 g/dL — ABNORMAL LOW (ref 13.0–17.0)
MCH: 24.8 pg — ABNORMAL LOW (ref 26.0–34.0)
MCHC: 31.1 g/dL (ref 30.0–36.0)
MCV: 79.6 fL — ABNORMAL LOW (ref 80.0–100.0)
Platelets: 225 10*3/uL (ref 150–400)
RBC: 5.05 MIL/uL (ref 4.22–5.81)
RDW: 14.7 % (ref 11.5–15.5)
WBC: 4.6 10*3/uL (ref 4.0–10.5)
nRBC: 0 % (ref 0.0–0.2)

## 2020-12-22 LAB — BASIC METABOLIC PANEL
Anion gap: 10 (ref 5–15)
BUN: 15 mg/dL (ref 6–20)
CO2: 24 mmol/L (ref 22–32)
Calcium: 9.2 mg/dL (ref 8.9–10.3)
Chloride: 102 mmol/L (ref 98–111)
Creatinine, Ser: 1.59 mg/dL — ABNORMAL HIGH (ref 0.61–1.24)
GFR, Estimated: 51 mL/min — ABNORMAL LOW (ref >60)
Glucose, Bld: 232 mg/dL — ABNORMAL HIGH (ref 70–99)
Potassium: 3.9 mmol/L (ref 3.5–5.1)
Sodium: 136 mmol/L (ref 135–145)

## 2020-12-22 LAB — CBG MONITORING, ED: Glucose-Capillary: 231 mg/dL — ABNORMAL HIGH (ref 70–99)

## 2020-12-22 MED ORDER — BENZONATATE 100 MG PO CAPS: 100.0000 mg | ORAL_CAPSULE | Freq: Three times a day (TID) | ORAL | 0 refills | Status: DC

## 2020-12-22 MED ORDER — SODIUM CHLORIDE 0.9 % IV BOLUS: 250.0000 mL | Freq: Once | INTRAVENOUS | Status: DC

## 2020-12-22 MED FILL — BENZONATATE 100 MG CAPS: 100 | 7 days supply | Qty: 21 | Fill #0

## 2020-12-22 NOTE — ED Notes (Signed)
Pt provided w/ 234mL water

## 2020-12-22 NOTE — ED Provider Notes (Signed)
[place] EMERGENCY DEPARTMENT Provider Note   CSN: [id number] Arrival date & time: 12/21/20  1324     History Chief Complaint  Patient presents with  . Generalized Body Aches  . Cough    [name] is a 55 y.o. male with past medical history significant for AICD due to CHF, last EF 30-35, CAD, diabetes, hypertension who presents for evaluation of myalgias and cough.  He is not vaccinated against COVID.  He is unsure of any COVID exposures.  States he has had a decreased appetite however no emesis, nausea or vomiting.  States he has been checking his blood sugars at home which have been in the 250s which is "normal for me."  He denies any firing of his AICD maker.  No fever however admits to chills.  Denies headache, lightheadedness, dizziness, chest pain, shortness of breath, hemoptysis, abdominal pain, diarrhea, dysuria, unilateral leg swelling, redness or warmth.  No rashes, lesions.  Denies additional aggravating or alleviating factors.  History obtained from patient and past medical records.  No interpreter used.   HPI     Past Medical History:  Diagnosis Date  . AICD (automatic cardioverter/defibrillator) present    [employer]  . CAD (coronary artery disease)    a. cath 01/31/17: 60% 1st RPLB, 60% dist RCA, 55% prox RCA, 10% pro LAD --> Rx TX.   Marland Kitchen Chronic systolic CHF (congestive heart failure) ([place]) 01/28/2017   1. Echo 01/29/17:  EF 20-25, normal wall motion, mild LAE // 2. EF 10-15 by [facility] 01/2017   . Diabetes mellitus    type II  . Diabetic foot infection ([place]) 03/2016   RT FOOT  . Dyspnea   . History of kidney stones    passed  . HTN (hypertension)   . Hyperlipidemia   . NICM (nonischemic cardiomyopathy) ([place]) 02/15/2017   1. Mod non-obs CAD on LHC in 01/2017 - CAD does not explain cardiomyopathy    Patient Active Problem List   Diagnosis Date Noted  . Nephrotic syndrome   . Hyperglycemia due to diabetes mellitus ([place]) 06/12/2020  .  Hypoglycemia 10/16/2018  . Acute metabolic encephalopathy 69/48/5462  . S/P ICD (internal cardiac defibrillator) procedure 01/19/2018  . Cardiac device in situ   . Syncope and collapse 01/13/2018  . NICM (nonischemic cardiomyopathy) ([place]) 02/15/2017  . Coronary artery disease involving native coronary artery of native heart without angina pectoris 02/15/2017  . Hyperlipidemia 02/15/2017  . Chronic combined systolic (congestive) and diastolic (congestive) heart failure ([place]) 01/28/2017  . Folliculitis 70/35/0093  . 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 ([place]) 81/82/9937  . Anemia, iron deficiency   . Noncompliance with medication regimen 03/30/2016  . Uncontrolled type 2 diabetes mellitus with hyperglycemia, with long-term current use of insulin ([place])   . Cellulitis 09/25/2015  . Essential hypertension 08/21/2013  . Diabetic neuropathy ([place]) 08/21/2013  . Fungal toenail infection 08/21/2013    Past Surgical History:  Procedure Laterality Date  . AIR/FLUID EXCHANGE Right 07/14/2020   Procedure: AIR/FLUID EXCHANGE;  Surgeon: Jalene Mullet, MD;  Location: [place];  Service: Ophthalmology;  Laterality: Right;  . AMPUTATION Right 04/01/2016   Procedure: Right Great Toe Amputation;  Surgeon: Newt Minion, MD;  Location: [place];  Service: Orthopedics;  Laterality: Right;  . AMPUTATION Right 06/19/2016   Procedure: AMPUTATION SECOND TOE;  Surgeon: Marybelle Killings, MD;  Location: [place];  Service: Orthopedics;  Laterality: Right;  . BACK SURGERY     for abscess  .  ICD IMPLANT N/A 01/15/2018   Procedure: ICD IMPLANT;  Surgeon: Deboraha Sprang, MD;  Location: [place] CV [facility];  Service: Cardiovascular;  Laterality: N/A;  . INJECTION OF SILICONE OIL Right 02/11/8249   Procedure: INJECTION OF SILICONE OIL;  Surgeon: Jalene Mullet, MD;  Location: [place];  Service: Ophthalmology;  Laterality: Right;  . INJECTION OF SILICONE OIL Right 5/39/7673   Procedure: INJECTION OF SILICONE OIL;   Surgeon: Jalene Mullet, MD;  Location: [place];  Service: Ophthalmology;  Laterality: Right;  . MEMBRANE PEEL Right 08/25/2020   Procedure: MEMBRANE PEEL;  Surgeon: Jalene Mullet, MD;  Location: [place];  Service: Ophthalmology;  Laterality: Right;  . PARS PLANA VITRECTOMY Right 07/14/2020   Procedure: PARS PLANA VITRECTOMY WITH 25 GAUGE, Membranetomy, drainage of subretinal fluid;  Surgeon: Jalene Mullet, MD;  Location: [place];  Service: Ophthalmology;  Laterality: Right;  . PARS PLANA VITRECTOMY Right 08/25/2020   Procedure: PARS PLANA VITRECTOMY WITH 25 GAUGE;  Surgeon: Jalene Mullet, MD;  Location: [place];  Service: Ophthalmology;  Laterality: Right;  . PHOTOCOAGULATION WITH LASER Right 07/14/2020   Procedure: PHOTOCOAGULATION WITH LASER;  Surgeon: Jalene Mullet, MD;  Location: [place];  Service: Ophthalmology;  Laterality: Right;  . PHOTOCOAGULATION WITH LASER Right 08/25/2020   Procedure: PHOTOCOAGULATION WITH LASER;  Surgeon: Jalene Mullet, MD;  Location: [place];  Service: Ophthalmology;  Laterality: Right;  . 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   Procedure: REPAIR OF HEMORRHAGIC DETACHMENT;  Surgeon: Jalene Mullet, MD;  Location: [place];  Service: Ophthalmology;  Laterality: Right;  . RIGHT/LEFT HEART CATH AND CORONARY ANGIOGRAPHY N/A 01/31/2017   Procedure: Right/Left Heart Cath and Coronary Angiography;  Surgeon: Troy Sine, MD;  Location: [place] CV LAB;  Service: Cardiovascular;  Laterality: N/A;  . SILICON OIL REMOVAL Right 03/30/3789   Procedure: SILICON OIL REMOVAL;  Surgeon: Jalene Mullet, MD;  Location: [place];  Service: Ophthalmology;  Laterality: Right;       Family History  Problem Relation Age of Onset  . Diabetes Mother   . Hypertension Mother   . Diabetes Father   . Heart attack Father   . Diabetes Sister   . Heart attack Maternal Grandmother     Social History   Tobacco Use  . Smoking status: Never Smoker  . Smokeless tobacco:  Never Used  Vaping Use  . Vaping Use: Never used  Substance Use Topics  . Alcohol use: No  . Drug use: No    Home Medications Prior to Admission medications   Medication Sig Start Date End Date Taking? Authorizing Provider  benzonatate (TESSALON) 100 MG capsule Take 1 capsule (100 mg total) by mouth every 8 (eight) hours. 12/22/20  Yes Jamarea Selner A, PA-C  Accu-Chek Softclix Lancets lancets Use as instructed to check blood sugar TID. E11.65 06/23/20   Charlott Rakes, MD  acetaminophen (TYLENOL) 325 MG tablet Take 650 mg by mouth every 6 (six) hours as needed for mild pain or moderate pain.    [provider]  albuterol (PROVENTIL HFA;VENTOLIN HFA) 108 (90 Base) MCG/ACT inhaler Inhale 1-2 puffs into the lungs every 6 (six) hours as needed for wheezing or shortness of breath. 11/15/16   Recardo Evangelist, PA-C  aspirin EC 81 MG EC tablet Take 1 tablet (81 mg total) by mouth daily. 01/16/18   Raiford Noble Latif, DO  Blood Glucose Monitoring Suppl (TRUE METRIX METER) DEVI Check blood sugar three times / day, before meals. 06/18/20   Newlin,  Enobong, MD  carvedilol (COREG) 12.5 MG tablet TAKE 1 TABLET (12.5 MG TOTAL) BY MOUTH 2 (TWO) TIMES DAILY WITH A MEAL. 11/11/20   Charlott Rakes, MD  cetirizine (ZYRTEC) 10 MG tablet TAKE 1 TABLET (10 MG TOTAL) BY MOUTH DAILY. 07/27/20   Charlott Rakes, MD  cyclobenzaprine (FLEXERIL) 5 MG tablet Take 1 tablet (5 mg total) by mouth at bedtime as needed for muscle spasms. 04/24/19   Charlott Rakes, MD  ferrous sulfate 325 (65 FE) MG tablet Take 1 tablet (325 mg total) by mouth 2 (two) times daily with a meal. Patient taking differently: Take 325 mg by mouth daily.  04/03/16   Dhungel, Nishant, MD  furosemide (LASIX) 40 MG tablet TAKE 1.5 TABLETS (60 MG TOTAL) BY MOUTH DAILY. 11/11/20 02/09/21  Charlott Rakes, MD  gabapentin (NEURONTIN) 400 MG capsule TAKE 1 CAPSULE BY MOUTH 3 TAKE TIMES DAILY. Patient taking differently: Take 400 mg by mouth 3 (three)  times daily.  01/07/20   Charlott Rakes, MD  glucose blood (ACCU-CHEK GUIDE) test strip Use as instructed to check blood sugar TID. E11.65 06/23/20   Charlott Rakes, MD  insulin glargine (LANTUS SOLOSTAR) 100 UNIT/ML Solostar Pen Inject 60 Units into the skin daily. 09/18/20   Ladell Pier, MD  NOVOLOG 100 UNIT/ML injection INJECT 0-15 UNITS INTO THE SKIN 3 (THREE) TIMES DAILY WITH MEALS. SLIDING SCALE CBG 70 - 120: 0 UNITS: CBG 121 - 140: 2 UNITS; CBG 140 - 200:8 UNITS 11/16/20   Charlott Rakes, MD  ondansetron (ZOFRAN) 4 MG tablet Take 1 tablet (4 mg total) by mouth every 6 (six) hours as needed for nausea or vomiting. 29/56/21   Delora Fuel, MD  potassium chloride (KLOR-CON) 10 MEQ tablet TAKE 1 TABLET (10 MEQ TOTAL) BY MOUTH DAILY. 08/24/20 11/22/20  Charlott Rakes, MD  rosuvastatin (CRESTOR) 40 MG tablet TAKE 1 TABLET (40 MG TOTAL) BY MOUTH DAILY. 08/24/20   Charlott Rakes, MD  sacubitril-valsartan (ENTRESTO) 49-51 MG Take 1 tablet by mouth 2 (two) times daily. Please make overdue appt with Dr. Caryl Comes before anymore refills. 2nd attempt 10/29/20   Deboraha Sprang, MD  spironolactone (ALDACTONE) 25 MG tablet TAKE 1 TABLET (25 MG TOTAL) BY MOUTH DAILY. 08/24/20   Charlott Rakes, MD  tiZANidine (ZANAFLEX) 2 MG tablet Take 1 tablet (2 mg total) by mouth every 8 (eight) hours as needed for muscle spasms. 07/09/20   Yu, Amy V, PA-C  TRUEPLUS PEN NEEDLES 32G X 4 MM MISC USE TO INJECT LANTUS DAILY. MUST USE NEW PEN NEEDLE WITH EACH INJECTION. Patient taking differently: as directed.  12/03/19   Charlott Rakes, MD    Allergies    Patient has no known allergies.  Review of Systems   Review of Systems  Constitutional: Positive for activity change, appetite change, chills and fatigue.  HENT: Negative.   Respiratory: Positive for cough. Negative for apnea, choking, chest tightness, shortness of breath, wheezing and stridor.   Cardiovascular: Negative.   Gastrointestinal: Negative.    Genitourinary: Negative.   Musculoskeletal: Negative.   Skin: Negative.   Neurological: Negative.   All other systems reviewed and are negative.   Physical Exam Updated Vital Signs BP (!) 175/98 (BP Location: Right Arm)   Pulse 79   Temp 98.4 F (36.9 C) (Oral)   Resp 17   Ht 5\' 7"  (1.702 m)   Wt 88.5 kg   SpO2 98%   BMI 30.54 kg/m   Physical Exam Vitals and nursing note reviewed.  Constitutional:  General: He is not in acute distress.    Appearance: He is well-developed and well-nourished. He is not ill-appearing, toxic-appearing or diaphoretic.  HENT:     Head: Normocephalic and atraumatic.     Nose: Nose normal.     Mouth/Throat:     Mouth: Mucous membranes are moist.  Eyes:     Pupils: Pupils are equal, round, and reactive to light.  Cardiovascular:     Rate and Rhythm: Normal rate and regular rhythm.     Pulses:          Dorsalis pedis pulses are 1+ on the right side and 1+ on the left side.     Heart sounds: Normal heart sounds.  Pulmonary:     Effort: Pulmonary effort is normal. No respiratory distress.     Breath sounds: Normal breath sounds and air entry.     Comments: Speaks in full sentences without difficulty.  Clear to auscultation bilaterally. Chest:     Comments: Equal rise and fall to chest wall.  Defibrillator to left upper chest without erythema, warmth Abdominal:     General: Bowel sounds are normal. There is no distension.     Palpations: Abdomen is soft.     Tenderness: There is no abdominal tenderness. There is no right CVA tenderness or guarding.     Comments: Soft, nontender  Musculoskeletal:        General: No swelling, tenderness, deformity or signs of injury. Normal range of motion.     Cervical back: Normal range of motion and neck supple.     Right lower leg: No edema.     Left lower leg: No edema.  Feet:     Comments: Right foot with 1st and 2nd toe amputations. No drainage, edema, erythema, warmth Skin:    General: Skin is  warm and dry.     Capillary Refill: Capillary refill takes less than 2 seconds.  Neurological:     General: No focal deficit present.     Mental Status: He is alert and oriented to person, place, and time.  Psychiatric:        Mood and Affect: Mood and affect normal.     ED Results / Procedures / Treatments   Labs (all labs ordered are listed, but only abnormal results are displayed) Labs Reviewed  RESP PANEL BY RT-PCR (FLU A&B, COVID) ARPGX2 - Abnormal; Notable for the following components:      Result Value   SARS Coronavirus 2 by RT PCR POSITIVE (*)    All other components within normal limits  CBC - Abnormal; Notable for the following components:   Hemoglobin 12.5 (*)    MCV 79.6 (*)    MCH 24.8 (*)    All other components within normal limits  BASIC METABOLIC PANEL - Abnormal; Notable for the following components:   Glucose, Bld 232 (*)    Creatinine, Ser 1.59 (*)    GFR, Estimated 51 (*)    All other components within normal limits  CBG MONITORING, ED - Abnormal; Notable for the following components:   Glucose-Capillary 246 (*)    All other components within normal limits  CBG MONITORING, ED - Abnormal; Notable for the following components:   Glucose-Capillary 231 (*)    All other components within normal limits    EKG None  Radiology No results found.  Procedures Procedures (including critical care time)  Medications Ordered in ED Medications  sodium chloride 0.9 % bolus 250 mL (0 mLs Intravenous Hold  12/22/20 1221)  acetaminophen (TYLENOL) tablet 650 mg (650 mg Oral Given 12/21/20 1501)   ED Course  I have reviewed the triage vital signs and the nursing notes.  Pertinent labs & imaging results that were available during my care of the patient were reviewed by me and considered in my medical decision making (see chart for details).  Unfortunately patient with wait time greater than 20 hours in the waiting room prior to being assessed by  provider.  55 year old presents for evaluation of cough, myalgias and chills.  He has not vaccine against COVID.  No known exposures.  On arrival he is febrile, nonseptic, not ill-appearing.  Defervesced with Tylenol provided from triage.  His heart and lungs are clear.  His abdomen is soft, nontender.  No clinical evidence of DVT on exam.  He has normal musculoskeletal exam.  Does have AICD maker for CHF, last EF 30-35.  Does appear mildly dehydrated.  No tachycardia, tachypnea or hypoxia.  He is ambulatory oxygen saturation greater than 97% on room air.  Work-up started from triage  Labs and imaging personally reviewed and interpreted:  CBC without leukocytosis Metabolic panel mild hyperglycemia at 232, creatinine 1.59 at baseline, anion gap 10, normal bicarb COVID positive  Given extended wait we will plan on rechecking CBG, chest xray.  Patient refused chest xray. States " I dont need it." Discussed risk vs benefit.  Voiced understanding risk versus benefit, continues to deny chest x-ray.  He is ambulatory without hypoxia with oxygen saturation greater than 98% on room air.  He has no tachycardia, tachypnea.  He has clear lung sounds.  Likely myalgias, cough from his COVID diagnosed today.  We will be given symptomatic management.  Encouraged rest, fluids at home.  He will return for any worsening symptoms.  I have low suspicion for any ACS, PE, dissection, bacterial infectious process, CHF exacerbation as cause of his cough.  The patient has been appropriately medically screened and/or stabilized in the ED. I have low suspicion for any other emergent medical condition which would require further screening, evaluation or treatment in the ED or require inpatient management.  Patient is hemodynamically stable and in no acute distress.  Patient able to ambulate in department prior to ED.  Evaluation does not show acute pathology that would require ongoing or additional emergent interventions while  in the emergency department or further inpatient treatment.  I have discussed the diagnosis with the patient and answered all questions.  Pain is been managed while in the emergency department and patient has no further complaints prior to discharge.  Patient is comfortable with plan discussed in room and is stable for discharge at this time.  I have discussed strict return precautions for returning to the emergency department.  Patient was encouraged to follow-up with PCP/specialist refer to at discharge.    MDM Rules/Calculators/A&P                         Jeffrey [name] was evaluated in Emergency Department on 12/22/2020 for the symptoms described in the history of present illness. He was evaluated in the context of the global COVID-19 pandemic, which necessitated consideration that the patient might be at risk for infection with the SARS-CoV-2 virus that causes COVID-19. Institutional protocols and algorithms that pertain to the evaluation of patients at risk for COVID-19 are in a state of rapid change based on information released by regulatory bodies including the CDC and federal and state organizations.  These policies and algorithms were followed during the patient's care in the ED. Final Clinical Impression(s) / ED Diagnoses Final diagnoses:  COVID  Myalgia    Rx / DC Orders ED Discharge Orders         Ordered    benzonatate (TESSALON) 100 MG capsule  Every 8 hours        12/22/20 1238           Shakeya Kerkman A, PA-C 12/22/20 1250    Dorie Rank, MD 12/23/20 [phone]

## 2020-12-22 NOTE — Discharge Instructions (Signed)
You may take Tylenol as needed for pain.  I have written you for some medication for your cough  Will need to isolate at home for 1 week per CDC guidelines due to your recent COVID positive test here in the ED.  Return if you have any new or worsening symptoms such as chest pain, shortness of breath, coughing up blood, inability to eat or drink without vomiting.

## 2020-12-22 NOTE — ED Notes (Signed)
Reviewed discharge instructions with patient. Follow-up care and medications reviewed. Patient  verbalized understanding. Patient A&Ox4, VSS, and ambulatory with steady gait upon discharge.  °

## 2020-12-23 ENCOUNTER — Telehealth: Payer: Self-pay

## 2020-12-23 NOTE — Telephone Encounter (Signed)
Transition Care Management Unsuccessful Follow-up Telephone Call  Date of discharge and from where:  12/22/2020 [name]  Attempts:  1st Attempt  Reason for unsuccessful TCM follow-up call:  Left voice message

## 2020-12-23 NOTE — Telephone Encounter (Signed)
No answer. Left VM to return call.

## 2020-12-23 NOTE — Telephone Encounter (Signed)
Transition Care Management Follow-up Telephone Call  Date of discharge and from where: 12/22/2020 [name]  How have you been since you were released from the hospital? Doing okay, just tested positive for COVID yesterday.   Any questions or concerns? No  Items Reviewed:  Did the pt receive and understand the discharge instructions provided? Yes   Medications obtained and verified? has not been able to pick up yet.   Other? No   Any new allergies since your discharge? No   Dietary orders reviewed? Yes  Do you have support at home? Yes   Functional Questionnaire: (I = Independent and D = Dependent) ADLs: I  Bathing/Dressing- I  Meal Prep- I  Eating- I  Maintaining continence- I  Transferring/Ambulation- I  Managing Meds- I  Follow up appointments reviewed:   PCP Hospital f/u appt confirmed? Yes  Scheduled to see Charlott Rakes, MD on 01/04/2021 @ 3:50pm.  [facility] f/u appt confirmed? Yes  Scheduled to see Barrington Ellison, [facility] on [facility] on 12/29/2020 @ 11:20am..  Are transportation arrangements needed? No   If their condition worsens, is the pt aware to call PCP or go to the Emergency Dept.? Yes  Was the patient provided with contact information for the PCP's office or ED? Yes  Was to pt encouraged to call back with questions or concerns? Yes

## 2020-12-24 ENCOUNTER — Telehealth: Payer: Self-pay

## 2020-12-24 NOTE — Telephone Encounter (Signed)
Copied from Luis M. Cintron [phone]. Topic: Quick Communication - See Telephone Encounter >> Dec 24, 2020 12:05 PM Loma Boston wrote: CRM for notification. See Telephone encounter for: 12/24/20.  [name] Roberinson states had left paperwork for ins, now household positive Covid, call her [phone] and will send someone for PU?

## 2020-12-28 NOTE — Telephone Encounter (Signed)
Pt has been notified of appt in feb.

## 2020-12-29 ENCOUNTER — Encounter: Payer: Medicare Other | Admitting: Student

## 2020-12-29 MED FILL — LANTUS SOLOSTAR 100 UNITS/M: 100 | 25 days supply | Qty: 15 | Fill #3

## 2020-12-29 MED FILL — NovoLOG 100 UNIT/ML SOLN: 100 | 22 days supply | Qty: 10 | Fill #1

## 2020-12-30 ENCOUNTER — Ambulatory Visit (INDEPENDENT_AMBULATORY_CARE_PROVIDER_SITE_OTHER): Payer: Medicaid Other

## 2020-12-30 DIAGNOSIS — I472 Ventricular tachycardia, unspecified: Secondary | ICD-10-CM

## 2020-12-31 LAB — CUP PACEART REMOTE DEVICE CHECK
Battery Remaining Longevity: 103 mo
Battery Voltage: 3.01 V
Brady Statistic RV Percent Paced: 0.01 %
Date Time Interrogation Session: 20220119043727
HighPow Impedance: 65 Ohm
Implantable Lead Implant Date: 20190204
Implantable Lead Location: 753860
Implantable Pulse Generator Implant Date: 20190204
Lead Channel Impedance Value: 323 Ohm
Lead Channel Impedance Value: 380 Ohm
Lead Channel Pacing Threshold Amplitude: 1.375 V
Lead Channel Pacing Threshold Pulse Width: 0.4 ms
Lead Channel Sensing Intrinsic Amplitude: 12.875 mV
Lead Channel Sensing Intrinsic Amplitude: 12.875 mV
Lead Channel Setting Pacing Amplitude: 2.75 V
Lead Channel Setting Pacing Pulse Width: 0.4 ms
Lead Channel Setting Sensing Sensitivity: 0.3 mV

## 2021-01-04 ENCOUNTER — Other Ambulatory Visit: Payer: Medicare Other

## 2021-01-04 ENCOUNTER — Ambulatory Visit: Payer: Medicare Other | Admitting: Family Medicine

## 2021-01-04 DIAGNOSIS — Z20822 Contact with and (suspected) exposure to covid-19: Secondary | ICD-10-CM

## 2021-01-06 LAB — NOVEL CORONAVIRUS, NAA

## 2021-01-07 ENCOUNTER — Other Ambulatory Visit: Payer: Medicare Other

## 2021-01-07 DIAGNOSIS — Z20822 Contact with and (suspected) exposure to covid-19: Secondary | ICD-10-CM

## 2021-01-08 LAB — NOVEL CORONAVIRUS, NAA: SARS-CoV-2, NAA: NOT DETECTED

## 2021-01-08 LAB — SARS-COV-2, NAA 2 DAY TAT

## 2021-01-11 NOTE — Progress Notes (Deleted)
Electrophysiology Office Note Date: 01/11/2021  ID:  Jeffrey [name], DOB 23-Sep-1966, MRN [id number]  PCP: Charlott Rakes, MD Primary Cardiologist: Mertie Moores, MD Electrophysiologist: Virl Axe, MD   CC: Routine ICD follow-up  Jeffrey [name] is a 55 y.o. male seen today for Virl Axe, MD for routine electrophysiology followup.  Since last being seen in our clinic the patient reports doing ***. He was diagnosed with COVID last month.  he denies chest pain, palpitations, dyspnea, PND, orthopnea, nausea, vomiting, dizziness, syncope, edema, weight gain, or early satiety. {He/she (caps):30048} has not had ICD shocks.   Device History: Medtronic Single Chamber ICD implanted 01/2018 for NICM and syncope History of appropriate therapy: No History of AAD therapy: No   Past Medical History:  Diagnosis Date  . AICD (automatic cardioverter/defibrillator) present    Medtronic  . CAD (coronary artery disease)    a. cath 01/31/17: 60% 1st RPLB, 60% dist RCA, 55% prox RCA, 10% pro LAD --> Rx TX.   Marland Kitchen Chronic systolic CHF (congestive heart failure) ([place]) 01/28/2017   1. Echo 01/29/17:  EF 20-25, normal wall motion, mild LAE // 2. EF 10-15 by [facility] 01/2017   . Diabetes mellitus    type II  . Diabetic foot infection ([place]) 03/2016   RT FOOT  . Dyspnea   . History of kidney stones    passed  . HTN (hypertension)   . Hyperlipidemia   . NICM (nonischemic cardiomyopathy) ([place]) 02/15/2017   1. Mod non-obs CAD on LHC in 01/2017 - CAD does not explain cardiomyopathy   Past Surgical History:  Procedure Laterality Date  . AIR/FLUID EXCHANGE Right 07/14/2020   Procedure: AIR/FLUID EXCHANGE;  Surgeon: Jalene Mullet, MD;  Location: [place];  Service: Ophthalmology;  Laterality: Right;  . AMPUTATION Right 04/01/2016   Procedure: Right Great Toe Amputation;  Surgeon: Newt Minion, MD;  Location: [place];  Service: Orthopedics;  Laterality: Right;  . AMPUTATION Right 06/19/2016   Procedure: AMPUTATION  SECOND TOE;  Surgeon: Marybelle Killings, MD;  Location: [place];  Service: Orthopedics;  Laterality: Right;  . BACK SURGERY     for abscess  . ICD IMPLANT N/A 01/15/2018   Procedure: ICD IMPLANT;  Surgeon: Deboraha Sprang, MD;  Location: [place] [facility];  Service: Cardiovascular;  Laterality: N/A;  . INJECTION OF SILICONE OIL Right [id number]   Procedure: INJECTION OF SILICONE OIL;  Surgeon: Jalene Mullet, MD;  Location: [place];  Service: Ophthalmology;  Laterality: Right;  . INJECTION OF SILICONE OIL Right [id number]   Procedure: INJECTION OF SILICONE OIL;  Surgeon: Jalene Mullet, MD;  Location: [place];  Service: Ophthalmology;  Laterality: Right;  . MEMBRANE PEEL Right 08/25/2020   Procedure: MEMBRANE PEEL;  Surgeon: Jalene Mullet, MD;  Location: [place];  Service: Ophthalmology;  Laterality: Right;  . PARS PLANA VITRECTOMY Right 07/14/2020   Procedure: PARS PLANA VITRECTOMY WITH 25 GAUGE, Membranetomy, drainage of subretinal fluid;  Surgeon: Jalene Mullet, MD;  Location: [place];  Service: Ophthalmology;  Laterality: Right;  . PARS PLANA VITRECTOMY Right 08/25/2020   Procedure: PARS PLANA VITRECTOMY WITH 25 GAUGE;  Surgeon: Jalene Mullet, MD;  Location: [place];  Service: Ophthalmology;  Laterality: Right;  . PHOTOCOAGULATION WITH LASER Right 07/14/2020   Procedure: PHOTOCOAGULATION WITH LASER;  Surgeon: Jalene Mullet, MD;  Location: [place];  Service: Ophthalmology;  Laterality: Right;  . PHOTOCOAGULATION WITH LASER Right 08/25/2020   Procedure: PHOTOCOAGULATION WITH LASER;  Surgeon: Jalene Mullet, MD;  Location:  [place] [facility];  Service: Ophthalmology;  Laterality: Right;  . 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   Procedure: REPAIR OF HEMORRHAGIC DETACHMENT;  Surgeon: Jalene Mullet, MD;  Location: [place];  Service: Ophthalmology;  Laterality: Right;  . RIGHT/LEFT HEART CATH AND CORONARY ANGIOGRAPHY N/A 01/31/2017   Procedure: Right/Left Heart Cath and Coronary Angiography;  Surgeon:  Troy Sine, MD;  Location: [place] [facility];  Service: Cardiovascular;  Laterality: N/A;  . SILICON OIL REMOVAL Right [id number]   Procedure: SILICON OIL REMOVAL;  Surgeon: Jalene Mullet, MD;  Location: [place];  Service: Ophthalmology;  Laterality: Right;    Current Outpatient Medications  Medication Sig Dispense Refill  . Accu-Chek Softclix Lancets lancets Use as instructed to check blood sugar TID. E11.65 100 each 2  . acetaminophen (TYLENOL) 325 MG tablet Take 650 mg by mouth every 6 (six) hours as needed for mild pain or moderate pain.    Marland Kitchen albuterol (PROVENTIL HFA;VENTOLIN HFA) 108 (90 Base) MCG/ACT inhaler Inhale 1-2 puffs into the lungs every 6 (six) hours as needed for wheezing or shortness of breath. 1 Inhaler 0  . aspirin EC 81 MG EC tablet Take 1 tablet (81 mg total) by mouth daily. 30 tablet 0  . benzonatate (TESSALON) 100 MG capsule Take 1 capsule (100 mg total) by mouth every 8 (eight) hours. 21 capsule 0  . Blood Glucose Monitoring Suppl (TRUE METRIX METER) DEVI Check blood sugar three times / day, before meals. 1 each 0  . carvedilol (COREG) 12.5 MG tablet TAKE 1 TABLET (12.5 MG TOTAL) BY MOUTH 2 (TWO) TIMES DAILY WITH A MEAL. 60 tablet 0  . cetirizine (ZYRTEC) 10 MG tablet TAKE 1 TABLET (10 MG TOTAL) BY MOUTH DAILY. 30 tablet 0  . cyclobenzaprine (FLEXERIL) 5 MG tablet Take 1 tablet (5 mg total) by mouth at bedtime as needed for muscle spasms. 30 tablet 0  . ferrous sulfate 325 (65 FE) MG tablet Take 1 tablet (325 mg total) by mouth 2 (two) times daily with a meal. (Patient taking differently: Take 325 mg by mouth daily. ) 60 tablet 3  . furosemide (LASIX) 40 MG tablet TAKE 1.5 TABLETS (60 MG TOTAL) BY MOUTH DAILY. 45 tablet 0  . gabapentin (NEURONTIN) 400 MG capsule TAKE 1 CAPSULE BY MOUTH 3 TAKE TIMES DAILY. (Patient taking differently: Take 400 mg by mouth 3 (three) times daily. ) 90 capsule 3  . glucose blood (ACCU-CHEK GUIDE) test strip Use as instructed to check blood  sugar TID. E11.65 100 each 2  . insulin glargine (LANTUS SOLOSTAR) 100 UNIT/ML Solostar Pen Inject 60 Units into the skin daily. 15 mL 0  . NOVOLOG 100 UNIT/ML injection INJECT 0-15 UNITS INTO THE SKIN 3 (THREE) TIMES DAILY WITH MEALS. SLIDING SCALE CBG 70 - 120: 0 UNITS: CBG 121 - 140: 2 UNITS; CBG 140 - 200:8 UNITS 10 mL 1  . ondansetron (ZOFRAN) 4 MG tablet Take 1 tablet (4 mg total) by mouth every 6 (six) hours as needed for nausea or vomiting. 12 tablet 0  . potassium chloride (KLOR-CON) 10 MEQ tablet TAKE 1 TABLET (10 MEQ TOTAL) BY MOUTH DAILY. 90 tablet 0  . rosuvastatin (CRESTOR) 40 MG tablet TAKE 1 TABLET (40 MG TOTAL) BY MOUTH DAILY. 90 tablet 0  . sacubitril-valsartan (ENTRESTO) 49-51 MG Take 1 tablet by mouth 2 (two) times daily. Please make overdue appt with Dr. Caryl Comes before anymore refills. 2nd attempt 30 tablet 0  . spironolactone (ALDACTONE) 25 MG tablet TAKE  1 TABLET (25 MG TOTAL) BY MOUTH DAILY. 90 tablet 0  . tiZANidine (ZANAFLEX) 2 MG tablet Take 1 tablet (2 mg total) by mouth every 8 (eight) hours as needed for muscle spasms. 15 tablet 0  . TRUEPLUS PEN NEEDLES 32G X 4 MM MISC USE TO INJECT LANTUS DAILY. MUST USE NEW PEN NEEDLE WITH EACH INJECTION. (Patient taking differently: as directed. ) 100 each 0   No current facility-administered medications for this visit.    Allergies:   Patient has no known allergies.   Social History: Social History   Socioeconomic History  . Marital status: Significant Other    Spouse name: Not on file  . Number of children: Not on file  . Years of education: Not on file  . Highest education level: Not on file  Occupational History  . Not on file  Tobacco Use  . Smoking status: Never Smoker  . Smokeless tobacco: Never Used  Vaping Use  . Vaping Use: Never used  Substance and Sexual Activity  . Alcohol use: No  . Drug use: No  . Sexual activity: Not on file  Other Topics Concern  . Not on file  Social History Narrative  . Not on  file   Social Determinants of Health   Financial Resource Strain: Not on file  Food Insecurity: Not on file  Transportation Needs: Not on file  Physical Activity: Not on file  Stress: Not on file  Social Connections: Not on file  Intimate Partner Violence: Not on file    Family History: Family History  Problem Relation Age of Onset  . Diabetes Mother   . Hypertension Mother   . Diabetes Father   . Heart attack Father   . Diabetes Sister   . Heart attack Maternal Grandmother     Review of Systems: All other systems reviewed and are otherwise negative except as noted above.   Physical Exam: There were no vitals filed for this visit.   GEN- The patient is well appearing, alert and oriented x 3 today.   HEENT: normocephalic, atraumatic; sclera clear, conjunctiva pink; hearing intact; oropharynx clear; neck supple, no JVP Lymph- no cervical lymphadenopathy Lungs- Clear to ausculation bilaterally, normal work of breathing.  No wheezes, rales, rhonchi Heart- Regular rate and rhythm, no murmurs, rubs or gallops, PMI not laterally displaced GI- soft, non-tender, non-distended, bowel sounds present, no hepatosplenomegaly Extremities- no clubbing or cyanosis. No edema; DP/PT/radial pulses 2+ bilaterally MS- no significant deformity or atrophy Skin- warm and dry, no rash or lesion; ICD pocket well healed Psych- euthymic mood, full affect Neuro- strength and sensation are intact  ICD interrogation- reviewed in detail today,  See PACEART report  EKG:  EKG is ordered today. The ekg ordered today shows ***  Recent Labs: 06/12/2020: ALT 16; B Natriuretic Peptide 23.2; Magnesium 1.6 12/22/2020: BUN 15; Creatinine, Ser 1.59; Hemoglobin 12.5; Platelets 225; Potassium 3.9; Sodium 136   Wt Readings from Last 3 Encounters:  12/22/20 195 lb (88.5 kg)  08/25/20 199 lb (90.3 kg)  07/14/20 205 lb (93 kg)     Other studies Reviewed: Additional studies/ records that were reviewed today  include: ***   Assessment and Plan:  1.  Chronic systolic dysfunction s/p Medtronic single chamber ICD  euvolemic today Stable on an appropriate medical regimen Normal ICD function See Pace Art report No changes today  2. HTN Stable on current medications.   3. [place] Consideration  Current medicines are reviewed at length with the patient today.  The patient {ACTIONS; HAS/DOES NOT HAVE:19233} concerns regarding his medicines.  The following changes were made today:  {NONE DEFAULTED:18576::"none"}  Labs/ tests ordered today include: *** No orders of the defined types were placed in this encounter.    Disposition:   Follow up with {Blank single:19197::"Dr. Allred","Dr. Arlan Organ. Klein","Dr. Camnitz","Dr. Lambert","EP APP"}  {gen number [id number] {TIME; UNITS DAY/WEEK/MONTH:19136}   Signed, Shirley Friar, PA-C  01/11/2021 1:02 PM  [place] [address] [phone] (office) [phone] (fax)

## 2021-01-11 NOTE — Progress Notes (Signed)
Remote ICD transmission.   

## 2021-01-12 ENCOUNTER — Encounter: Payer: Medicare Other | Admitting: Student

## 2021-01-13 ENCOUNTER — Encounter: Payer: Self-pay | Admitting: Family Medicine

## 2021-01-13 ENCOUNTER — Other Ambulatory Visit: Payer: Self-pay | Admitting: Family Medicine

## 2021-01-13 ENCOUNTER — Other Ambulatory Visit: Payer: Self-pay

## 2021-01-13 ENCOUNTER — Ambulatory Visit: Payer: Medicare Other | Attending: Family Medicine | Admitting: Family Medicine

## 2021-01-13 VITALS — BP 169/113 | HR 94 | Ht 67.0 in | Wt 207.0 lb

## 2021-01-13 DIAGNOSIS — Z7982 Long term (current) use of aspirin: Secondary | ICD-10-CM | POA: Insufficient documentation

## 2021-01-13 DIAGNOSIS — Z89411 Acquired absence of right great toe: Secondary | ICD-10-CM | POA: Diagnosis not present

## 2021-01-13 DIAGNOSIS — E785 Hyperlipidemia, unspecified: Secondary | ICD-10-CM | POA: Insufficient documentation

## 2021-01-13 DIAGNOSIS — E1169 Type 2 diabetes mellitus with other specified complication: Secondary | ICD-10-CM | POA: Diagnosis not present

## 2021-01-13 DIAGNOSIS — I5022 Chronic systolic (congestive) heart failure: Secondary | ICD-10-CM | POA: Insufficient documentation

## 2021-01-13 DIAGNOSIS — Z9119 Patient's noncompliance with other medical treatment and regimen: Secondary | ICD-10-CM

## 2021-01-13 DIAGNOSIS — Z794 Long term (current) use of insulin: Secondary | ICD-10-CM | POA: Diagnosis not present

## 2021-01-13 DIAGNOSIS — Z91199 Patient's noncompliance with other medical treatment and regimen due to unspecified reason: Secondary | ICD-10-CM

## 2021-01-13 DIAGNOSIS — E1165 Type 2 diabetes mellitus with hyperglycemia: Secondary | ICD-10-CM | POA: Diagnosis not present

## 2021-01-13 DIAGNOSIS — Z95810 Presence of automatic (implantable) cardiac defibrillator: Secondary | ICD-10-CM | POA: Insufficient documentation

## 2021-01-13 DIAGNOSIS — I110 Hypertensive heart disease with heart failure: Secondary | ICD-10-CM | POA: Insufficient documentation

## 2021-01-13 DIAGNOSIS — Z79899 Other long term (current) drug therapy: Secondary | ICD-10-CM | POA: Insufficient documentation

## 2021-01-13 DIAGNOSIS — I428 Other cardiomyopathies: Secondary | ICD-10-CM | POA: Diagnosis not present

## 2021-01-13 DIAGNOSIS — Z8249 Family history of ischemic heart disease and other diseases of the circulatory system: Secondary | ICD-10-CM | POA: Insufficient documentation

## 2021-01-13 DIAGNOSIS — T383X6A Underdosing of insulin and oral hypoglycemic [antidiabetic] drugs, initial encounter: Secondary | ICD-10-CM | POA: Insufficient documentation

## 2021-01-13 DIAGNOSIS — E1142 Type 2 diabetes mellitus with diabetic polyneuropathy: Secondary | ICD-10-CM | POA: Diagnosis present

## 2021-01-13 DIAGNOSIS — Z89421 Acquired absence of other right toe(s): Secondary | ICD-10-CM | POA: Insufficient documentation

## 2021-01-13 DIAGNOSIS — Z833 Family history of diabetes mellitus: Secondary | ICD-10-CM | POA: Diagnosis not present

## 2021-01-13 LAB — POCT GLYCOSYLATED HEMOGLOBIN (HGB A1C): HbA1c, POC (controlled diabetic range): 10.7 % — AB (ref 0.0–7.0)

## 2021-01-13 LAB — GLUCOSE, POCT (MANUAL RESULT ENTRY): POC Glucose: 50 mg/dl — AB (ref 70–99)

## 2021-01-13 MED ORDER — FUROSEMIDE 40 MG PO TABS: 60.0000 mg | ORAL_TABLET | Freq: Every day | ORAL | 6 refills | Status: DC

## 2021-01-13 MED ORDER — GABAPENTIN 400 MG PO CAPS: ORAL_CAPSULE | ORAL | 3 refills | Status: DC

## 2021-01-13 MED ORDER — SPIRONOLACTONE 25 MG PO TABS: 25.0000 mg | ORAL_TABLET | Freq: Every day | ORAL | 1 refills | Status: DC

## 2021-01-13 MED ORDER — POTASSIUM CHLORIDE ER 10 MEQ PO TBCR: 10.0000 meq | EXTENDED_RELEASE_TABLET | Freq: Every day | ORAL | 1 refills | Status: DC

## 2021-01-13 MED ORDER — LANTUS SOLOSTAR 100 UNIT/ML ~~LOC~~ SOPN: 60.0000 [IU] | PEN_INJECTOR | Freq: Every day | SUBCUTANEOUS | 3 refills | Status: DC

## 2021-01-13 MED ORDER — CARVEDILOL 12.5 MG PO TABS: 12.5000 mg | ORAL_TABLET | Freq: Two times a day (BID) | ORAL | 1 refills | Status: DC

## 2021-01-13 MED ORDER — ROSUVASTATIN CALCIUM 40 MG PO TABS: 40.0000 mg | ORAL_TABLET | Freq: Every day | ORAL | 1 refills | Status: DC

## 2021-01-13 MED FILL — SPIRONOLACTONE 25 MG TABLET: 25 | 90 days supply | Qty: 90 | Fill #0

## 2021-01-13 MED FILL — POTASSIUM CHLORIDE ER 10 ME: 10 | 90 days supply | Qty: 90 | Fill #0

## 2021-01-13 MED FILL — FUROSEMIDE 40 MG TAB: 40 | 30 days supply | Qty: 45 | Fill #0

## 2021-01-13 MED FILL — GABAPENTIN 400 MG CAPSULE: 400 | 30 days supply | Qty: 90 | Fill #0

## 2021-01-13 MED FILL — CARVEDILOL 12.5 MG TABLET: 12.5 | 90 days supply | Qty: 180 | Fill #0

## 2021-01-13 MED FILL — ROSUVASTATIN CALCIUM 40 MG: 40 | 90 days supply | Qty: 90 | Fill #0

## 2021-01-13 NOTE — Progress Notes (Signed)
Subjective:  Patient ID: Jeffrey [name], male    DOB: 07-02-1966  Age: 55 y.o. MRN: [id number]  CC: No chief complaint on file.   HPI Jeffrey [name] is a 55 year old male with a history of type 2 diabetes mellitus (A1c 10.7), diabetic neuropathy, status post right great and second toe amputation , hypertension, Nonischemic cardiomyopathy, CHF (EF 20 % from 2-D echo 01/2018 status post ICD placement in 01/2018) who is seen todayfor follow-up visit. He sees Cardiology this Friday. He has no chest pain or dyspnea but has intermittent pedal edema up to his ankle. Gained 12 lbs in the last 3 weeks BP is elevated today as he is yet to take his antihypertensive.  Unsure if he has been compliant with his Lasix. Denies chest pain or shortness of breath.  He is not compliant with insulin but only takes it when he does not feel good about 3-4/week Does not check his sugars either and has not been compliant with a diabetic diet. He has no additional concerns today. He has a disability form which he would like completed.  Past Medical History:  Diagnosis Date  . AICD (automatic cardioverter/defibrillator) present    [employer]  . CAD (coronary artery disease)    a. cath 01/31/17: 60% 1st RPLB, 60% dist RCA, 55% prox RCA, 10% pro LAD --> Rx TX.   Marland Kitchen Chronic systolic CHF (congestive heart failure) ([place]) 01/28/2017   1. Echo 01/29/17:  EF 20-25, normal wall motion, mild LAE // 2. EF 10-15 by [facility] 01/2017   . Diabetes mellitus    type II  . Diabetic foot infection ([place]) 03/2016   RT FOOT  . Dyspnea   . History of kidney stones    passed  . HTN (hypertension)   . Hyperlipidemia   . NICM (nonischemic cardiomyopathy) ([place]) 02/15/2017   1. Mod non-obs CAD on LHC in 01/2017 - CAD does not explain cardiomyopathy    Past Surgical History:  Procedure Laterality Date  . AIR/FLUID EXCHANGE Right 07/14/2020   Procedure: AIR/FLUID EXCHANGE;  Surgeon: Jalene Mullet, MD;  Location: [place];  Service:  Ophthalmology;  Laterality: Right;  . AMPUTATION Right 04/01/2016   Procedure: Right Great Toe Amputation;  Surgeon: Newt Minion, MD;  Location: [place];  Service: Orthopedics;  Laterality: Right;  . AMPUTATION Right 06/19/2016   Procedure: AMPUTATION SECOND TOE;  Surgeon: Marybelle Killings, MD;  Location: [place];  Service: Orthopedics;  Laterality: Right;  . BACK SURGERY     for abscess  . ICD IMPLANT N/A 01/15/2018   Procedure: ICD IMPLANT;  Surgeon: Deboraha Sprang, MD;  Location: [place] [facility];  Service: Cardiovascular;  Laterality: N/A;  . INJECTION OF SILICONE OIL Right [id number]   Procedure: INJECTION OF SILICONE OIL;  Surgeon: Jalene Mullet, MD;  Location: [place];  Service: Ophthalmology;  Laterality: Right;  . INJECTION OF SILICONE OIL Right [id number]   Procedure: INJECTION OF SILICONE OIL;  Surgeon: Jalene Mullet, MD;  Location: [place];  Service: Ophthalmology;  Laterality: Right;  . MEMBRANE PEEL Right 08/25/2020   Procedure: MEMBRANE PEEL;  Surgeon: Jalene Mullet, MD;  Location: [place];  Service: Ophthalmology;  Laterality: Right;  . PARS PLANA VITRECTOMY Right 07/14/2020   Procedure: PARS PLANA VITRECTOMY WITH 25 GAUGE, Membranetomy, drainage of subretinal fluid;  Surgeon: Jalene Mullet, MD;  Location: [place];  Service: Ophthalmology;  Laterality: Right;  . PARS PLANA VITRECTOMY Right 08/25/2020   Procedure: PARS PLANA VITRECTOMY WITH 25 GAUGE;  Surgeon: Jalene Mullet, MD;  Location: [place];  Service: Ophthalmology;  Laterality: Right;  . PHOTOCOAGULATION WITH LASER Right 07/14/2020   Procedure: PHOTOCOAGULATION WITH LASER;  Surgeon: Jalene Mullet, MD;  Location: [place];  Service: Ophthalmology;  Laterality: Right;  . PHOTOCOAGULATION WITH LASER Right 08/25/2020   Procedure: PHOTOCOAGULATION WITH LASER;  Surgeon: Jalene Mullet, MD;  Location: [place];  Service: Ophthalmology;  Laterality: Right;  . 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   Procedure: REPAIR OF  HEMORRHAGIC DETACHMENT;  Surgeon: Jalene Mullet, MD;  Location: [place];  Service: Ophthalmology;  Laterality: Right;  . RIGHT/LEFT HEART CATH AND CORONARY ANGIOGRAPHY N/A 01/31/2017   Procedure: Right/Left Heart Cath and Coronary Angiography;  Surgeon: Troy Sine, MD;  Location: [place] [facility];  Service: Cardiovascular;  Laterality: N/A;  . SILICON OIL REMOVAL Right [id number]   Procedure: SILICON OIL REMOVAL;  Surgeon: Jalene Mullet, MD;  Location: [place];  Service: Ophthalmology;  Laterality: Right;    Family History  Problem Relation Age of Onset  . Diabetes Mother   . Hypertension Mother   . Diabetes Father   . Heart attack Father   . Diabetes Sister   . Heart attack Maternal Grandmother     No Known Allergies  Outpatient Medications Prior to Visit  Medication Jeffrey Dispense Refill  . Accu-Chek Softclix Lancets lancets Use as instructed to check blood sugar TID. E11.65 100 each 2  . acetaminophen (TYLENOL) 325 MG tablet Take 650 mg by mouth every 6 (six) hours as needed for mild pain or moderate pain.    Marland Kitchen albuterol (PROVENTIL HFA;VENTOLIN HFA) 108 (90 Base) MCG/ACT inhaler Inhale 1-2 puffs into the lungs every 6 (six) hours as needed for wheezing or shortness of breath. 1 Inhaler 0  . aspirin EC 81 MG EC tablet Take 1 tablet (81 mg total) by mouth daily. 30 tablet 0  . Blood Glucose Monitoring Suppl (TRUE METRIX METER) DEVI Check blood sugar three times / day, before meals. 1 each 0  . cetirizine (ZYRTEC) 10 MG tablet TAKE 1 TABLET (10 MG TOTAL) BY MOUTH DAILY. 30 tablet 0  . ferrous sulfate 325 (65 FE) MG tablet Take 1 tablet (325 mg total) by mouth 2 (two) times daily with a meal. (Patient taking differently: Take 325 mg by mouth daily.) 60 tablet 3  . glucose blood (ACCU-CHEK GUIDE) test strip Use as instructed to check blood sugar TID. E11.65 100 each 2  . NOVOLOG 100 UNIT/ML injection INJECT 0-15 UNITS INTO THE SKIN 3 (THREE) TIMES DAILY WITH MEALS. SLIDING SCALE CBG 70 -  120: 0 UNITS: CBG 121 - 140: 2 UNITS; CBG 140 - 200:8 UNITS 10 mL 1  . ondansetron (ZOFRAN) 4 MG tablet Take 1 tablet (4 mg total) by mouth every 6 (six) hours as needed for nausea or vomiting. 12 tablet 0  . sacubitril-valsartan (ENTRESTO) 49-51 MG Take 1 tablet by mouth 2 (two) times daily. Please make overdue appt with Dr. Caryl Comes before anymore refills. 2nd attempt 30 tablet 0  . TRUEPLUS PEN NEEDLES 32G X 4 MM MISC USE TO INJECT LANTUS DAILY. MUST USE NEW PEN NEEDLE WITH EACH INJECTION. (Patient taking differently: as directed.) 100 each 0  . carvedilol (COREG) 12.5 MG tablet TAKE 1 TABLET (12.5 MG TOTAL) BY MOUTH 2 (TWO) TIMES DAILY WITH A MEAL. 60 tablet 0  . cyclobenzaprine (FLEXERIL) 5 MG tablet Take 1 tablet (5 mg total) by mouth at bedtime as needed for muscle spasms. [place]  tablet 0  . furosemide (LASIX) 40 MG tablet TAKE 1.5 TABLETS (60 MG TOTAL) BY MOUTH DAILY. 45 tablet 0  . gabapentin (NEURONTIN) 400 MG capsule TAKE 1 CAPSULE BY MOUTH 3 TAKE TIMES DAILY. (Patient taking differently: Take 400 mg by mouth 3 (three) times daily.) 90 capsule 3  . insulin glargine (LANTUS SOLOSTAR) 100 UNIT/ML Solostar Pen Inject 60 Units into the skin daily. 15 mL 0  . rosuvastatin (CRESTOR) 40 MG tablet TAKE 1 TABLET (40 MG TOTAL) BY MOUTH DAILY. 90 tablet 0  . spironolactone (ALDACTONE) 25 MG tablet TAKE 1 TABLET (25 MG TOTAL) BY MOUTH DAILY. 90 tablet 0  . tiZANidine (ZANAFLEX) 2 MG tablet Take 1 tablet (2 mg total) by mouth every 8 (eight) hours as needed for muscle spasms. 15 tablet 0  . benzonatate (TESSALON) 100 MG capsule Take 1 capsule (100 mg total) by mouth every 8 (eight) hours. (Patient not taking: Reported on 01/13/2021) 21 capsule 0  . potassium chloride (KLOR-CON) 10 MEQ tablet TAKE 1 TABLET (10 MEQ TOTAL) BY MOUTH DAILY. 90 tablet 0   No facility-administered medications prior to visit.     ROS Review of Systems  Constitutional: Negative for activity change and appetite change.  HENT:  Negative for sinus pressure and sore throat.   Eyes: Negative for visual disturbance.  Respiratory: Negative for cough, chest tightness and shortness of breath.   Cardiovascular: Negative for chest pain and leg swelling.  Gastrointestinal: Negative for abdominal distention, abdominal pain, constipation and diarrhea.  Endocrine: Negative.   Genitourinary: Negative for dysuria.  Musculoskeletal: Negative for joint swelling and myalgias.  Skin: Negative for rash.  Allergic/Immunologic: Negative.   Neurological: Negative for weakness, light-headedness and numbness.  Psychiatric/Behavioral: Negative for dysphoric mood and suicidal ideas.    Objective:  BP (!) 169/113   Pulse 94   Ht '5\' 7"'$  (1.702 m)   Wt 207 lb (93.9 kg)   SpO2 97%   BMI 32.42 kg/m   BP/Weight 01/13/2021 12/22/2020 [id number]  Systolic BP [id number] 0000000 [id number]  Diastolic BP 123456 98 93  Wt. (Lbs) 207 195 199  BMI 32.42 30.54 31.17      Physical Exam Constitutional:      Appearance: He is well-developed.  Neck:     Vascular: No JVD.  Cardiovascular:     Rate and Rhythm: Normal rate.     Heart sounds: Normal heart sounds. No murmur heard.   Pulmonary:     Effort: Pulmonary effort is normal.     Breath sounds: Normal breath sounds. No wheezing or rales.  Chest:     Chest wall: No tenderness.  Abdominal:     General: Bowel sounds are normal. There is no distension.     Palpations: Abdomen is soft. There is no mass.     Tenderness: There is no abdominal tenderness.  Musculoskeletal:        General: Normal range of motion.     Right lower leg: No edema.     Left lower leg: No edema.  Neurological:     Mental Status: He is alert and oriented to person, place, and time.  Psychiatric:        Mood and Affect: Mood normal.     CMP Latest Ref Rng & Units 12/22/2020 08/25/2020 07/14/2020  Glucose 70 - 99 mg/dL 232(H) 48(L) 115(H)  BUN 6 - 20 mg/dL '15 17 20  '$ Creatinine 0.61 - 1.24 mg/dL 1.59(H) 1.32(H) 1.37(H)  Sodium 135 -  145 mmol/L 136 140  136  Potassium 3.5 - 5.1 mmol/L 3.9 3.0(L) 3.7  Chloride 98 - 111 mmol/L 102 105 107  CO2 22 - 32 mmol/L '24 25 23  '$ Calcium 8.9 - 10.3 mg/dL 9.2 9.5 8.9  Total Protein 6.5 - 8.1 g/dL - - -  Total Bilirubin 0.3 - 1.2 mg/dL - - -  Alkaline Phos 38 - 126 U/L - - -  AST 15 - 41 U/L - - -  ALT 0 - 44 U/L - - -    Lipid Panel     Component Value Date/Time   CHOL 145 06/13/2020 0027   CHOL 137 04/25/2019 0925   TRIG 167 (H) 06/13/2020 0027   HDL 43 06/13/2020 0027   HDL 36 (L) 04/25/2019 0925   CHOLHDL 3.4 06/13/2020 0027   VLDL 33 06/13/2020 0027   LDLCALC 69 06/13/2020 0027   LDLCALC 57 04/25/2019 0925    CBC    Component Value Date/Time   WBC 4.6 12/22/2020 0124   RBC 5.05 12/22/2020 0124   HGB 12.5 (L) 12/22/2020 0124   HCT 40.2 12/22/2020 0124   PLT 225 12/22/2020 0124   MCV 79.6 (L) 12/22/2020 0124   MCH 24.8 (L) 12/22/2020 0124   MCHC 31.1 12/22/2020 0124   RDW 14.7 12/22/2020 0124   LYMPHSABS 1.8 01/16/2018 0436   MONOABS 0.4 01/16/2018 0436   EOSABS 0.1 01/16/2018 0436   BASOSABS 0.0 01/16/2018 0436    Lab Results  Component Value Date   HGBA1C 10.7 (A) 01/13/2021    Assessment & Plan:  1. Diabetic polyneuropathy associated with type 2 diabetes mellitus (HCC) Stable Continue gabapentin - Microalbumin / creatinine urine ratio - gabapentin (NEURONTIN) 400 MG capsule; TAKE 1 CAPSULE BY MOUTH 3 TAKE TIMES DAILY.  Dispense: 90 capsule; Refill: 3  2. Hypertensive heart disease with chronic systolic congestive heart failure (HCC) EF of 20% status post ICD Blood pressure is uncontrolled due to not taking antihypertensive Compliance has been strongly emphasized He has gained 12 pounds in the last 3 weeks; compliance with Lasix cannot be ascertained Strongly encouraged to comply with his Lasix, low-sodium diet which he is not compliant with He has an upcoming appointment with cardiology later this week at which time he will be reevaluated. -  carvedilol (COREG) 12.5 MG tablet; Take 1 tablet (12.5 mg total) by mouth 2 (two) times daily with a meal.  Dispense: 180 tablet; Refill: 1 - furosemide (LASIX) 40 MG tablet; Take 1.5 tablets (60 mg total) by mouth daily.  Dispense: 45 tablet; Refill: 6 - potassium chloride (KLOR-CON) 10 MEQ tablet; Take 1 tablet (10 mEq total) by mouth daily.  Dispense: 90 tablet; Refill: 1 - spironolactone (ALDACTONE) 25 MG tablet; Take 1 tablet (25 mg total) by mouth daily.  Dispense: 90 tablet; Refill: 1  3. Hyperlipidemia associated with type 2 diabetes mellitus (HCC) Controlled Low-cholesterol diet - rosuvastatin (CRESTOR) 40 MG tablet; Take 1 tablet (40 mg total) by mouth daily.  Dispense: 90 tablet; Refill: 1  4. Non-compliance We have gone over implications of noncompliance with him and complications of his medical conditions including death He seems to lack of insight regarding his medical condition I have offered to order home health nursing for him however the male friend with him states he does not need that  5. Uncontrolled type 2 diabetes mellitus with hyperglycemia, with long-term current use of insulin (HCC) Uncontrolled with A1c of 10.7 due to noncompliance His goal is less than 7.0 Discussed implications of noncompliance and complications  of diabetes I would not adjust his regimen given he has been noncompliant His blood sugar was 50 in the clinic today due to the fact that he has not had a meal in the last several hours.  Glucose drink administered in the clinic along with crackers - insulin glargine (LANTUS SOLOSTAR) 100 UNIT/ML Solostar Pen; Inject 60 Units into the skin daily.  Dispense: 30 mL; Refill: 3 - POCT glucose (manual entry) - POCT glycosylated hemoglobin (Hb A1C)    Meds ordered this encounter  Medications  . gabapentin (NEURONTIN) 400 MG capsule    Jeffrey: TAKE 1 CAPSULE BY MOUTH 3 TAKE TIMES DAILY.    Dispense:  90 capsule    Refill:  3  . carvedilol (COREG) 12.5 MG  tablet    Jeffrey: Take 1 tablet (12.5 mg total) by mouth 2 (two) times daily with a meal.    Dispense:  180 tablet    Refill:  1  . furosemide (LASIX) 40 MG tablet    Jeffrey: Take 1.5 tablets (60 mg total) by mouth daily.    Dispense:  45 tablet    Refill:  6  . potassium chloride (KLOR-CON) 10 MEQ tablet    Jeffrey: Take 1 tablet (10 mEq total) by mouth daily.    Dispense:  90 tablet    Refill:  1  . rosuvastatin (CRESTOR) 40 MG tablet    Jeffrey: Take 1 tablet (40 mg total) by mouth daily.    Dispense:  90 tablet    Refill:  1  . spironolactone (ALDACTONE) 25 MG tablet    Jeffrey: Take 1 tablet (25 mg total) by mouth daily.    Dispense:  90 tablet    Refill:  1  . insulin glargine (LANTUS SOLOSTAR) 100 UNIT/ML Solostar Pen    Jeffrey: Inject 60 Units into the skin daily.    Dispense:  30 mL    Refill:  3    Follow-up: Return in about 2 weeks (around 01/27/2021) for virtual visit for disability form.       Charlott Rakes, MD, FAAFP. [facility] [place], [place]   01/13/2021, 5:04 PM

## 2021-01-14 ENCOUNTER — Ambulatory Visit: Payer: Medicare Other | Admitting: Physician Assistant

## 2021-01-14 LAB — MICROALBUMIN / CREATININE URINE RATIO
Creatinine, Urine: 168.7 mg/dL
Microalb/Creat Ratio: 2914 mg/g creat — ABNORMAL HIGH (ref 0–29)
Microalbumin, Urine: 4916.6 ug/mL

## 2021-01-15 ENCOUNTER — Telehealth: Payer: Self-pay

## 2021-01-15 NOTE — Telephone Encounter (Signed)
Patient name and DOB has been verified Patient was informed of lab results. Patient had no questions.  

## 2021-01-15 NOTE — Telephone Encounter (Signed)
-----   Message from Charlott Rakes, MD sent at 01/14/2021  4:59 PM EST ----- Urine reveals a lot of protein in his urine which is a marker of Diabetes affecting his kidneys. Compliance with his Diabetic medication and diabetic diet is important

## 2021-01-18 ENCOUNTER — Telehealth: Payer: Self-pay | Admitting: Family Medicine

## 2021-01-18 NOTE — Telephone Encounter (Signed)
Pt was called and a VM was left informing pt to return phone call for televisit.

## 2021-01-18 NOTE — Telephone Encounter (Signed)
error:[id number] ° °

## 2021-01-18 NOTE — Telephone Encounter (Addendum)
Patient checking on the status of disability paperwork and would like a follow up call as soon as possible. Insurance is requesting forms filled out prior to 02/02/2021. Patient PCP has no available appointments prior to 02/02/2021 please call  best # [phone]

## 2021-01-22 ENCOUNTER — Other Ambulatory Visit: Payer: Self-pay | Admitting: Internal Medicine

## 2021-01-22 MED ORDER — ENTRESTO 49-51 MG PO TABS: 1.0000 | ORAL_TABLET | Freq: Two times a day (BID) | ORAL | 0 refills | Status: DC

## 2021-01-22 MED FILL — GABAPENTIN 400 MG CAPSULE: 400 | 30 days supply | Qty: 90 | Fill #0

## 2021-01-22 MED FILL — FUROSEMIDE 40 MG TAB: 40 | 30 days supply | Qty: 45 | Fill #0

## 2021-01-22 MED FILL — POTASSIUM CHLORIDE ER 10 ME: 10 | 30 days supply | Qty: 30 | Fill #2

## 2021-01-22 MED FILL — SPIRONOLACTONE 25 MG TABLET: 25 | 30 days supply | Qty: 30 | Fill #2

## 2021-01-27 ENCOUNTER — Encounter: Payer: Self-pay | Admitting: Student

## 2021-01-27 ENCOUNTER — Telehealth: Payer: Self-pay

## 2021-01-27 ENCOUNTER — Other Ambulatory Visit: Payer: Self-pay | Admitting: Family Medicine

## 2021-01-27 ENCOUNTER — Other Ambulatory Visit: Payer: Self-pay | Admitting: Student

## 2021-01-27 ENCOUNTER — Ambulatory Visit (INDEPENDENT_AMBULATORY_CARE_PROVIDER_SITE_OTHER): Payer: Medicare Other | Admitting: Student

## 2021-01-27 ENCOUNTER — Other Ambulatory Visit: Payer: Self-pay

## 2021-01-27 VITALS — BP 164/110 | HR 78 | Ht 67.0 in | Wt 205.4 lb

## 2021-01-27 DIAGNOSIS — I472 Ventricular tachycardia, unspecified: Secondary | ICD-10-CM

## 2021-01-27 DIAGNOSIS — Z95810 Presence of automatic (implantable) cardiac defibrillator: Secondary | ICD-10-CM | POA: Diagnosis not present

## 2021-01-27 DIAGNOSIS — I428 Other cardiomyopathies: Secondary | ICD-10-CM

## 2021-01-27 DIAGNOSIS — E1165 Type 2 diabetes mellitus with hyperglycemia: Secondary | ICD-10-CM

## 2021-01-27 LAB — CUP PACEART INCLINIC DEVICE CHECK
Battery Remaining Longevity: 103 mo
Battery Voltage: 2.98 V
Brady Statistic RV Percent Paced: 0.01 %
Date Time Interrogation Session: 20220216122907
HighPow Impedance: 62 Ohm
Implantable Lead Implant Date: 20190204
Implantable Lead Location: 753860
Implantable Pulse Generator Implant Date: 20190204
Lead Channel Impedance Value: 285 Ohm
Lead Channel Impedance Value: 380 Ohm
Lead Channel Pacing Threshold Amplitude: 1.25 V
Lead Channel Pacing Threshold Pulse Width: 0.4 ms
Lead Channel Sensing Intrinsic Amplitude: 11.625 mV
Lead Channel Sensing Intrinsic Amplitude: 13.25 mV
Lead Channel Setting Pacing Amplitude: 2.5 V
Lead Channel Setting Pacing Pulse Width: 0.6 ms
Lead Channel Setting Sensing Sensitivity: 0.3 mV

## 2021-01-27 MED ORDER — HYDRALAZINE HCL 25 MG PO TABS: 25.0000 mg | ORAL_TABLET | Freq: Three times a day (TID) | ORAL | 3 refills | Status: DC

## 2021-01-27 MED ORDER — ISOSORBIDE MONONITRATE ER 30 MG PO TB24: 30.0000 mg | ORAL_TABLET | Freq: Every day | ORAL | 3 refills | Status: DC

## 2021-01-27 MED FILL — ISOSORBIDE MN ER 30 MG TAB: 30 | 90 days supply | Qty: 90 | Fill #0

## 2021-01-27 MED FILL — NovoLOG 100 UNIT/ML SOLN: 100 | 22 days supply | Qty: 10 | Fill #0

## 2021-01-27 MED FILL — hydrALAZINE HCL 25 MG TABS: 25 | 90 days supply | Qty: 270 | Fill #0

## 2021-01-27 NOTE — Patient Instructions (Signed)
Medication Instructions:  Your physician has recommended you make the following change in your medication:   START: Hydralazine '25mg'$  three times daily START: Isosorbide '30mg'$  once daily  *If you need a refill on your cardiac medications before your next appointment, please call your pharmacy*   Lab Work: TODAY: BMET, ProBNP  If you have labs (blood work) drawn today and your tests are completely normal, you will receive your results only by: Marland Kitchen MyChart Message (if you have MyChart) OR . A paper copy in the mail If you have any lab test that is abnormal or we need to change your treatment, we will call you to review the results.   Follow-Up: At [facility], you and your health needs are our priority.  As part of our continuing mission to provide you with exceptional heart care, we have created designated Provider Care Teams.  These Care Teams include your primary Cardiologist (physician) and Advanced Practice Providers (APPs -  Physician Assistants and Nurse Practitioners) who all work together to provide you with the care you need, when you need it.  We recommend signing up for the patient portal called "MyChart".  Sign up information is provided on this After Visit Summary.  MyChart is used to connect with patients for Virtual Visits (Telemedicine).  Patients are able to view lab/test results, encounter notes, upcoming appointments, etc.  Non-urgent messages can be sent to your provider as well.   To learn more about what you can do with MyChart, go to [email or web address].    Your next appointment:   03/08/2021  The format for your next appointment:   In Person  Provider:   Legrand Como "Jonni Sanger" [place], [place]

## 2021-01-27 NOTE — Progress Notes (Signed)
Electrophysiology Office Note Date: 01/27/2021  ID:  Jeffrey [name], DOB 30-Jul-1966, MRN [id number]  PCP: Charlott Rakes, MD Primary Cardiologist: Mertie Moores, MD Electrophysiologist: Virl Axe, MD   CC: Routine ICD follow-up  Jeffrey [name] is a 55 y.o. male seen today for Virl Axe, MD for routine electrophysiology followup.  Since last being seen in our clinic the patient reports doing well. BP elevated on arrival. Runs [id number] systolic at home. He has SOB with ADLs including bathing and showering. His mom notices that when he is getting into or out of bed he is SOB.  month.  he denies exertional chest pain, palpitations, PND, nausea, vomiting, dizziness, syncope, weight gain, or early satiety. He has not had ICD shocks.   Device History: Medtronic Single Chamber ICD implanted 01/2018 for NICM and syncope History of appropriate therapy: No History of AAD therapy: No   Past Medical History:  Diagnosis Date  . AICD (automatic cardioverter/defibrillator) present    Medtronic  . CAD (coronary artery disease)    a. cath 01/31/17: 60% 1st RPLB, 60% dist RCA, 55% prox RCA, 10% pro LAD --> Rx TX.   Marland Kitchen Chronic systolic CHF (congestive heart failure) ([place]) 01/28/2017   1. Echo 01/29/17:  EF 20-25, normal wall motion, mild LAE // 2. EF 10-15 by [facility] 01/2017   . Diabetes mellitus    type II  . Diabetic foot infection ([place]) 03/2016   RT FOOT  . Dyspnea   . History of kidney stones    passed  . HTN (hypertension)   . Hyperlipidemia   . NICM (nonischemic cardiomyopathy) ([place]) 02/15/2017   1. Mod non-obs CAD on LHC in 01/2017 - CAD does not explain cardiomyopathy   Past Surgical History:  Procedure Laterality Date  . AIR/FLUID EXCHANGE Right 07/14/2020   Procedure: AIR/FLUID EXCHANGE;  Surgeon: Jalene Mullet, MD;  Location: [place];  Service: Ophthalmology;  Laterality: Right;  . AMPUTATION Right 04/01/2016   Procedure: Right Great Toe Amputation;  Surgeon: Newt Minion, MD;   Location: [place];  Service: Orthopedics;  Laterality: Right;  . AMPUTATION Right 06/19/2016   Procedure: AMPUTATION SECOND TOE;  Surgeon: Marybelle Killings, MD;  Location: [place];  Service: Orthopedics;  Laterality: Right;  . BACK SURGERY     for abscess  . ICD IMPLANT N/A 01/15/2018   Procedure: ICD IMPLANT;  Surgeon: Deboraha Sprang, MD;  Location: [place] CV [facility];  Service: Cardiovascular;  Laterality: N/A;  . INJECTION OF SILICONE OIL Right [id number]   Procedure: INJECTION OF SILICONE OIL;  Surgeon: Jalene Mullet, MD;  Location: [place];  Service: Ophthalmology;  Laterality: Right;  . INJECTION OF SILICONE OIL Right [id number]   Procedure: INJECTION OF SILICONE OIL;  Surgeon: Jalene Mullet, MD;  Location: [place];  Service: Ophthalmology;  Laterality: Right;  . MEMBRANE PEEL Right 08/25/2020   Procedure: MEMBRANE PEEL;  Surgeon: Jalene Mullet, MD;  Location: [place];  Service: Ophthalmology;  Laterality: Right;  . PARS PLANA VITRECTOMY Right 07/14/2020   Procedure: PARS PLANA VITRECTOMY WITH 25 GAUGE, Membranetomy, drainage of subretinal fluid;  Surgeon: Jalene Mullet, MD;  Location: [place];  Service: Ophthalmology;  Laterality: Right;  . PARS PLANA VITRECTOMY Right 08/25/2020   Procedure: PARS PLANA VITRECTOMY WITH 25 GAUGE;  Surgeon: Jalene Mullet, MD;  Location: [place];  Service: Ophthalmology;  Laterality: Right;  . PHOTOCOAGULATION WITH LASER Right 07/14/2020   Procedure: PHOTOCOAGULATION WITH LASER;  Surgeon: Jalene Mullet, MD;  Location: [place];  Service: Ophthalmology;  Laterality: Right;  . PHOTOCOAGULATION WITH LASER Right 08/25/2020   Procedure: PHOTOCOAGULATION WITH LASER;  Surgeon: Jalene Mullet, MD;  Location: [place];  Service: Ophthalmology;  Laterality: Right;  . 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   Procedure: REPAIR OF HEMORRHAGIC DETACHMENT;  Surgeon: Jalene Mullet, MD;  Location: [place];  Service: Ophthalmology;  Laterality: Right;  . RIGHT/LEFT HEART CATH  AND CORONARY ANGIOGRAPHY N/A 01/31/2017   Procedure: Right/Left Heart Cath and Coronary Angiography;  Surgeon: Troy Sine, MD;  Location: [place] CV [facility];  Service: Cardiovascular;  Laterality: N/A;  . SILICON OIL REMOVAL Right [id number]   Procedure: SILICON OIL REMOVAL;  Surgeon: Jalene Mullet, MD;  Location: [place];  Service: Ophthalmology;  Laterality: Right;    Current Outpatient Medications  Medication Sig Dispense Refill  . Accu-Chek Softclix Lancets lancets Use as instructed to check blood sugar TID. E11.65 100 each 2  . acetaminophen (TYLENOL) 325 MG tablet Take 650 mg by mouth every 6 (six) hours as needed for mild pain or moderate pain.    Marland Kitchen albuterol (PROVENTIL HFA;VENTOLIN HFA) 108 (90 Base) MCG/ACT inhaler Inhale 1-2 puffs into the lungs every 6 (six) hours as needed for wheezing or shortness of breath. 1 Inhaler 0  . aspirin EC 81 MG EC tablet Take 1 tablet (81 mg total) by mouth daily. 30 tablet 0  . benzonatate (TESSALON) 100 MG capsule Take 1 capsule (100 mg total) by mouth every 8 (eight) hours. 21 capsule 0  . Blood Glucose Monitoring Suppl (TRUE METRIX METER) DEVI Check blood sugar three times / day, before meals. 1 each 0  . carvedilol (COREG) 12.5 MG tablet Take 1 tablet (12.5 mg total) by mouth 2 (two) times daily with a meal. 180 tablet 1  . cetirizine (ZYRTEC) 10 MG tablet TAKE 1 TABLET (10 MG TOTAL) BY MOUTH DAILY. 30 tablet 0  . ferrous sulfate 325 (65 FE) MG tablet Take 1 tablet (325 mg total) by mouth 2 (two) times daily with a meal. (Patient taking differently: Take 325 mg by mouth daily.) 60 tablet 3  . furosemide (LASIX) 40 MG tablet Take 1.5 tablets (60 mg total) by mouth daily. 45 tablet 6  . gabapentin (NEURONTIN) 400 MG capsule TAKE 1 CAPSULE BY MOUTH 3 TAKE TIMES DAILY. 90 capsule 3  . glucose blood (ACCU-CHEK GUIDE) test strip Use as instructed to check blood sugar TID. E11.65 100 each 2  . insulin glargine (LANTUS SOLOSTAR) 100 UNIT/ML Solostar Pen  Inject 60 Units into the skin daily. 30 mL 3  . NOVOLOG 100 UNIT/ML injection INJECT 0-15 UNITS INTO THE SKIN 3 (THREE) TIMES DAILY WITH MEALS. SLIDING SCALE CBG 70 - 120: 0 UNITS: CBG 121 - 140: 2 UNITS; CBG 140 - 200:8 UNITS 10 mL 1  . ondansetron (ZOFRAN) 4 MG tablet Take 1 tablet (4 mg total) by mouth every 6 (six) hours as needed for nausea or vomiting. 12 tablet 0  . potassium chloride (KLOR-CON) 10 MEQ tablet Take 1 tablet (10 mEq total) by mouth daily. 90 tablet 1  . rosuvastatin (CRESTOR) 40 MG tablet Take 1 tablet (40 mg total) by mouth daily. 90 tablet 1  . sacubitril-valsartan (ENTRESTO) 49-51 MG Take 1 tablet by mouth 2 (two) times daily. Please make overdue appt with Dr. Caryl Comes before anymore refills. 3rd and Final attempt 30 tablet 0  . spironolactone (ALDACTONE) 25 MG tablet Take 1 tablet (25 mg total) by mouth daily. 90 tablet 1  .  TRUEPLUS PEN NEEDLES 32G X 4 MM MISC USE TO INJECT LANTUS DAILY. MUST USE NEW PEN NEEDLE WITH EACH INJECTION. (Patient taking differently: as directed.) 100 each 0   No current facility-administered medications for this visit.    Allergies:   Patient has no known allergies.   Social History: Social History   Socioeconomic History  . Marital status: Significant Other    Spouse name: Not on file  . Number of children: Not on file  . Years of education: Not on file  . Highest education level: Not on file  Occupational History  . Not on file  Tobacco Use  . Smoking status: Never Smoker  . Smokeless tobacco: Never Used  Vaping Use  . Vaping Use: Never used  Substance and Sexual Activity  . Alcohol use: No  . Drug use: No  . Sexual activity: Not on file  Other Topics Concern  . Not on file  Social History Narrative  . Not on file   Social Determinants of Health   Financial Resource Strain: Not on file  Food Insecurity: Not on file  Transportation Needs: Not on file  Physical Activity: Not on file  Stress: Not on file  Social  Connections: Not on file  Intimate Partner Violence: Not on file    Family History: Family History  Problem Relation Age of Onset  . Diabetes Mother   . Hypertension Mother   . Diabetes Father   . Heart attack Father   . Diabetes Sister   . Heart attack Maternal Grandmother     Review of Systems: All other systems reviewed and are otherwise negative except as noted above.   Physical Exam: Vitals:   01/27/21 1145  BP: (!) 164/110  Pulse: 78  SpO2: 96%  Weight: 205 lb 6.4 oz (93.2 kg)  Height: '5\' 7"'$  (1.702 m)     GEN- The patient is well appearing, alert and oriented x 3 today.   HEENT: normocephalic, atraumatic; sclera clear, conjunctiva pink; hearing intact; oropharynx clear; neck supple, no JVP Lymph- no cervical lymphadenopathy Lungs- Clear to ausculation bilaterally, normal work of breathing.  No wheezes, rales, rhonchi Heart- Regular rate and rhythm, no murmurs, rubs or gallops, PMI not laterally displaced GI- soft, non-tender, non-distended, bowel sounds present, no hepatosplenomegaly Extremities- no clubbing or cyanosis. No edema; DP/PT/radial pulses 2+ bilaterally MS- no significant deformity or atrophy Skin- warm and dry, no rash or lesion; ICD pocket well healed Psych- euthymic mood, full affect Neuro- strength and sensation are intact  ICD interrogation- reviewed in detail today,  See PACEART report  EKG:  EKG is ordered today. The ekg ordered today shows NSR at 78 bpm, QRS 88 ms.  Recent Labs: 06/12/2020: ALT 16; B Natriuretic Peptide 23.2; Magnesium 1.6 12/22/2020: BUN 15; Creatinine, Ser 1.59; Hemoglobin 12.5; Platelets 225; Potassium 3.9; Sodium 136   Wt Readings from Last 3 Encounters:  01/27/21 205 lb 6.4 oz (93.2 kg)  01/13/21 207 lb (93.9 kg)  12/22/20 195 lb (88.5 kg)     Other studies Reviewed: Additional studies/ records that were reviewed today include: Previous EP office notes. Most recent labs and echo.    Assessment and Plan:  1.   Chronic systolic dysfunction s/p Medtronic single chamber ICD  euvolemic today Stable on an appropriate medical regimen Normal ICD function See Pace Art report No changes today  2. HTN Add hydralazine and imdur as above.    3. Barostim Consideration Jeffrey [name]'s heart failure has failed to improve  despite titration of guideline directed medication such that he qualifies for the [facility] NEO device. The following information from the patient's medical record supports the medical necessity of this procedure for my patient, consistent with the FDA on-label indication for BAROSTIM NEO:  ? LVEF of 30-35%  confirmed by Echo on 06/13/2020   ? NT-proBNP of <1600 pg/ml  = Labs to be drawn today, 01/27/21  ?Symptomatic despite medication management of: diuretic, beta blocker, ACEi/ARB/ARNi and Aldosterone inhibitor as evidenced by symptoms below.   ? This patients signs and symptoms of heart failure include "dyspnea with mild to moderate exertion, orthopnea, edema and fatigue   ? NYHA Congestive Heart Failure Classification: III  ? Recent hospitalization for Heart Failure on (not applicable for this patient)   This patient is NOT indicated for cardiac resynchronization therapy because QRS = 88 ms by EKG on 01/27/21    Current medicines are reviewed at length with the patient today.   The patient does not have concerns regarding his medicines.  The following changes were made today:  none  Labs/ tests ordered today include:  No orders of the defined types were placed in this encounter.  Disposition:   Follow up with EP APP  4-6 weeks for continued HTN management and discussion of barostim if qualifies.   Jacalyn Lefevre, PA-C  01/27/2021 11:56 AM  [facility] [address] [place] [address] [phone] (office) [phone] (fax)

## 2021-01-27 NOTE — Telephone Encounter (Signed)
**Note De-Identified [employer] Obfuscation** I started a Entresto PA through covermymeds and received this message:  CEDRIC VARGHESE Key: [id number] - PA Case ID: [id number] - Rx #: [id number] Outcome: Approved today Type:Prior Auth Coverage Start Date:12/28/2020;Coverage End Date:01/27/2022 Drug: Delene Loll 49-'51MG'$  tablets Form: [employer] Electronic PA Form (2017 NCPDP) Original Claim Info [id number] Provide Notice: Medicare Prescription Drug Coverage and Your Rights  I have notified [place] [facility] of this approval.

## 2021-01-28 LAB — BASIC METABOLIC PANEL
BUN/Creatinine Ratio: 11 (ref 9–20)
BUN: 15 mg/dL (ref 6–24)
CO2: 21 mmol/L (ref 20–29)
Calcium: 8.8 mg/dL (ref 8.7–10.2)
Chloride: 102 mmol/L (ref 96–106)
Creatinine, Ser: 1.36 mg/dL — ABNORMAL HIGH (ref 0.76–1.27)
GFR calc Af Amer: 67 mL/min/{1.73_m2} (ref >59)
GFR calc non Af Amer: 58 mL/min/{1.73_m2} — ABNORMAL LOW (ref >59)
Glucose: 342 mg/dL — ABNORMAL HIGH (ref 65–99)
Potassium: 3.8 mmol/L (ref 3.5–5.2)
Sodium: 138 mmol/L (ref 134–144)

## 2021-01-28 LAB — PRO B NATRIURETIC PEPTIDE: NT-Pro BNP: 185 pg/mL (ref 0–210)

## 2021-01-28 MED FILL — ENTRESTO 49 MG-51 MG TABLET: 49-51 | 30 days supply | Qty: 30 | Fill #0

## 2021-02-02 ENCOUNTER — Ambulatory Visit: Payer: Medicare Other | Attending: Family Medicine | Admitting: Family Medicine

## 2021-02-02 ENCOUNTER — Other Ambulatory Visit: Payer: Self-pay

## 2021-02-02 DIAGNOSIS — I110 Hypertensive heart disease with heart failure: Secondary | ICD-10-CM | POA: Diagnosis not present

## 2021-02-02 DIAGNOSIS — I5022 Chronic systolic (congestive) heart failure: Secondary | ICD-10-CM | POA: Diagnosis not present

## 2021-02-02 DIAGNOSIS — E1142 Type 2 diabetes mellitus with diabetic polyneuropathy: Secondary | ICD-10-CM

## 2021-02-02 NOTE — Progress Notes (Signed)
Virtual Visit via Telephone Note  I connected with Jeffrey [name], on 02/02/2021 at 8:33 AM by telephone due to the COVID-19 pandemic and verified that I am speaking with the correct person using two identifiers.   Consent: I discussed the limitations, risks, security and privacy concerns of performing an evaluation and management service by telephone and the availability of in person appointments. I also discussed with the patient that there may be a patient responsible charge related to this service. The patient expressed understanding and agreed to proceed.   Location of Patient: Home  Location of Provider: Clinic   Persons participating in Telemedicine visit: Jeffrey [name]     History of Present Illness: Jeffrey [name] is a 55 year old male with a history of type 2 diabetes mellitus (A1c 10.7), diabetic neuropathy, status post right great and second toe amputation , hypertension, Nonischemic cardiomyopathy, CHF (EF 20 % from 2-D echo 01/2018 status post ICD placement in 01/2018) who is seen todayfor follow-up visit. He is requesting completion of disability form for [employer]. States he has been disabled since 2017.  His male friend Jeffrey [name] provides some of the history needed to complete his form  He continues to have shortness of breath on mild exertion and was recently seen by cardiology and hydralazine and Imdur was added to his regimen.  Notes also indicate consideration for Barostim.  Past Medical History:  Diagnosis Date  . AICD (automatic cardioverter/defibrillator) present    Medtronic  . CAD (coronary artery disease)    a. cath 01/31/17: 60% 1st RPLB, 60% dist RCA, 55% prox RCA, 10% pro LAD --> Rx TX.   Marland Kitchen Chronic systolic CHF (congestive heart failure) ([place]) 01/28/2017   1. Echo 01/29/17:  EF 20-25, normal wall motion, mild LAE // 2. EF 10-15 by [facility] 01/2017   . Diabetes mellitus    type II  .  Diabetic foot infection ([place]) 03/2016   RT FOOT  . Dyspnea   . History of kidney stones    passed  . HTN (hypertension)   . Hyperlipidemia   . NICM (nonischemic cardiomyopathy) ([place]) 02/15/2017   1. Mod non-obs CAD on LHC in 01/2017 - CAD does not explain cardiomyopathy   No Known Allergies  Current Outpatient Medications on File Prior to Visit  Medication Sig Dispense Refill  . Accu-Chek Softclix Lancets lancets Use as instructed to check blood sugar TID. E11.65 100 each 2  . acetaminophen (TYLENOL) 325 MG tablet Take 650 mg by mouth every 6 (six) hours as needed for mild pain or moderate pain.    Marland Kitchen albuterol (PROVENTIL HFA;VENTOLIN HFA) 108 (90 Base) MCG/ACT inhaler Inhale 1-2 puffs into the lungs every 6 (six) hours as needed for wheezing or shortness of breath. 1 Inhaler 0  . aspirin EC 81 MG EC tablet Take 1 tablet (81 mg total) by mouth daily. 30 tablet 0  . benzonatate (TESSALON) 100 MG capsule Take 1 capsule (100 mg total) by mouth every 8 (eight) hours. 21 capsule 0  . Blood Glucose Monitoring Suppl (TRUE METRIX METER) DEVI Check blood sugar three times / day, before meals. 1 each 0  . carvedilol (COREG) 12.5 MG tablet Take 1 tablet (12.5 mg total) by mouth 2 (two) times daily with a meal. 180 tablet 1  . cetirizine (ZYRTEC) 10 MG tablet TAKE 1 TABLET (10 MG TOTAL) BY MOUTH DAILY. 30 tablet 0  . ferrous sulfate 325 (65 FE) MG tablet Take 1  tablet (325 mg total) by mouth 2 (two) times daily with a meal. (Patient taking differently: Take 325 mg by mouth daily.) 60 tablet 3  . furosemide (LASIX) 40 MG tablet Take 1.5 tablets (60 mg total) by mouth daily. 45 tablet 6  . gabapentin (NEURONTIN) 400 MG capsule TAKE 1 CAPSULE BY MOUTH 3 TAKE TIMES DAILY. 90 capsule 3  . glucose blood (ACCU-CHEK GUIDE) test strip Use as instructed to check blood sugar TID. E11.65 100 each 2  . hydrALAZINE (APRESOLINE) 25 MG tablet Take 1 tablet (25 mg total) by mouth 3 (three) times daily. 270 tablet 3  .  insulin glargine (LANTUS SOLOSTAR) 100 UNIT/ML Solostar Pen Inject 60 Units into the skin daily. 30 mL 3  . isosorbide mononitrate (IMDUR) 30 MG 24 hr tablet Take 1 tablet (30 mg total) by mouth daily. 90 tablet 3  . NOVOLOG 100 UNIT/ML injection INJECT 0-15 UNITS INTO THE SKIN 3 (THREE) TIMES DAILY WITH MEALS. SLIDING SCALE CBG 70 - 120: 0 UNITS: CBG 121 - 140: 2 UNITS; CBG 140 - 200:8 UNITS 10 mL 1  . ondansetron (ZOFRAN) 4 MG tablet Take 1 tablet (4 mg total) by mouth every 6 (six) hours as needed for nausea or vomiting. 12 tablet 0  . potassium chloride (KLOR-CON) 10 MEQ tablet Take 1 tablet (10 mEq total) by mouth daily. 90 tablet 1  . rosuvastatin (CRESTOR) 40 MG tablet Take 1 tablet (40 mg total) by mouth daily. 90 tablet 1  . sacubitril-valsartan (ENTRESTO) 49-51 MG Take 1 tablet by mouth 2 (two) times daily. Please make overdue appt with Dr. Caryl Comes before anymore refills. 3rd and Final attempt 30 tablet 0  . spironolactone (ALDACTONE) 25 MG tablet Take 1 tablet (25 mg total) by mouth daily. 90 tablet 1  . TRUEPLUS PEN NEEDLES 32G X 4 MM MISC USE TO INJECT LANTUS DAILY. MUST USE NEW PEN NEEDLE WITH EACH INJECTION. (Patient taking differently: as directed.) 100 each 0   No current facility-administered medications on file prior to visit.    ROS: See HPI  Observations/Objective: Speaks in full sentences Not in acute distress  Lab Results  Component Value Date   HGBA1C 10.7 (A) 01/13/2021    Assessment and Plan: 1. Hypertensive heart disease with chronic systolic congestive heart failure (HCC) EF of 30 to 35% Status post ICD Noncompliance with medications places him at risk for future exacerbations Currently followed up by cardiology I have completed his disability form  2. Diabetic polyneuropathy associated with type 2 diabetes mellitus ([place]) Uncontrolled Discussed the need to comply with his diabetic medications He recently had a visit for chronic care management.  Follow  Up Instructions: 3 months for chronic disease management   I discussed the assessment and treatment plan with the patient. The patient was provided an opportunity to ask questions and all were answered. The patient agreed with the plan and demonstrated an understanding of the instructions.   The patient was advised to call back or seek an in-person evaluation if the symptoms worsen or if the condition fails to improve as anticipated.     I provided 13 minutes total of non-face-to-face time during this encounter.   Charlott Rakes, MD, FAAFP. [place] [facility] [place], [place]   02/02/2021, 8:33 AM

## 2021-02-03 ENCOUNTER — Ambulatory Visit: Payer: Medicaid Other | Admitting: Podiatrist

## 2021-03-08 ENCOUNTER — Other Ambulatory Visit: Payer: Self-pay | Admitting: *Deleted

## 2021-03-08 ENCOUNTER — Encounter: Payer: Medicare Other | Admitting: Student

## 2021-03-08 DIAGNOSIS — Z006 Encounter for examination for normal comparison and control in clinical research program: Secondary | ICD-10-CM

## 2021-03-11 ENCOUNTER — Other Ambulatory Visit: Payer: Self-pay

## 2021-03-11 ENCOUNTER — Ambulatory Visit (INDEPENDENT_AMBULATORY_CARE_PROVIDER_SITE_OTHER): Payer: Medicare Other | Admitting: Student

## 2021-03-11 ENCOUNTER — Ambulatory Visit (HOSPITAL_COMMUNITY): Admission: RE | Admit: 2021-03-11 | Payer: Medicare Other | Source: Ambulatory Visit

## 2021-03-11 ENCOUNTER — Other Ambulatory Visit: Payer: Self-pay | Admitting: Student

## 2021-03-11 ENCOUNTER — Encounter: Payer: Self-pay | Admitting: Student

## 2021-03-11 VITALS — BP 160/90 | HR 90 | Ht 66.0 in | Wt 206.0 lb

## 2021-03-11 DIAGNOSIS — Z95810 Presence of automatic (implantable) cardiac defibrillator: Secondary | ICD-10-CM

## 2021-03-11 DIAGNOSIS — I472 Ventricular tachycardia, unspecified: Secondary | ICD-10-CM

## 2021-03-11 DIAGNOSIS — I5022 Chronic systolic (congestive) heart failure: Secondary | ICD-10-CM | POA: Diagnosis not present

## 2021-03-11 DIAGNOSIS — I110 Hypertensive heart disease with heart failure: Secondary | ICD-10-CM

## 2021-03-11 DIAGNOSIS — I428 Other cardiomyopathies: Secondary | ICD-10-CM | POA: Diagnosis not present

## 2021-03-11 MED ORDER — ENTRESTO 97-103 MG PO TABS: 1.0000 | ORAL_TABLET | Freq: Two times a day (BID) | ORAL | 3 refills | Status: DC

## 2021-03-11 MED ORDER — CARVEDILOL 12.5 MG PO TABS: 12.5000 mg | ORAL_TABLET | Freq: Two times a day (BID) | ORAL | 3 refills | Status: DC

## 2021-03-11 NOTE — Patient Instructions (Signed)
Medication Instructions:  Your physician has recommended you make the following change in your medication:   INCREASE: Entresto to 97-'103mg'$  twice daily  *If you need a refill on your cardiac medications before your next appointment, please call your pharmacy*   Lab Work: BMET in 7-10 days - 03/19/2021  If you have labs (blood work) drawn today and your tests are completely normal, you will receive your results only by: Marland Kitchen MyChart Message (if you have MyChart) OR . A paper copy in the mail If you have any lab test that is abnormal or we need to change your treatment, we will call you to review the results.  Follow-Up: At [facility], you and your health needs are our priority.  As part of our continuing mission to provide you with exceptional heart care, we have created designated Provider Care Teams.  These Care Teams include your primary Cardiologist (physician) and Advanced Practice Providers (APPs -  Physician Assistants and Nurse Practitioners) who all work together to provide you with the care you need, when you need it.  We recommend signing up for the patient portal called "MyChart".  Sign up information is provided on this After Visit Summary.  MyChart is used to connect with patients for Virtual Visits (Telemedicine).  Patients are able to view lab/test results, encounter notes, upcoming appointments, etc.  Non-urgent messages can be sent to your provider as well.   To learn more about what you can do with MyChart, go to [email or web address].    Your next appointment:   As scheduled

## 2021-03-11 NOTE — Progress Notes (Signed)
Electrophysiology Office Note Date: 03/11/2021  ID:  Jeffrey [name], DOB 03-24-1966, MRN [id number]  PCP: Charlott Rakes, MD Primary Cardiologist: Mertie Moores, MD Electrophysiologist: Virl Axe, MD   CC: Routine ICD follow-up  Jeffrey [name] is a 55 y.o. male seen today for Virl Axe, MD for routine electrophysiology followup.  Since last being seen in our clinic the patient reports doing well. BP remains elevated ~ 0000000 systolic. BPs runs 120-130s at home. Girlfriend reports she is strict about making sure he gets his medication. He has SOB with mild exertion and is being seen by research today for further work up for [employer]. He has not had ICD shocks.   Device History: Medtronic Single Chamber ICD implanted 01/2018 for NICM and syncope History of appropriate therapy: No History of AAD therapy: No   Past Medical History:  Diagnosis Date  . AICD (automatic cardioverter/defibrillator) present    Medtronic  . CAD (coronary artery disease)    a. cath 01/31/17: 60% 1st RPLB, 60% dist RCA, 55% prox RCA, 10% pro LAD --> Rx TX.   Marland Kitchen Chronic systolic CHF (congestive heart failure) ([place]) 01/28/2017   1. Echo 01/29/17:  EF 20-25, normal wall motion, mild LAE // 2. EF 10-15 by [facility] 01/2017   . Diabetes mellitus    type II  . Diabetic foot infection ([place]) 03/2016   RT FOOT  . Dyspnea   . History of kidney stones    passed  . HTN (hypertension)   . Hyperlipidemia   . NICM (nonischemic cardiomyopathy) ([place]) 02/15/2017   1. Mod non-obs CAD on LHC in 01/2017 - CAD does not explain cardiomyopathy   Past Surgical History:  Procedure Laterality Date  . AIR/FLUID EXCHANGE Right 07/14/2020   Procedure: AIR/FLUID EXCHANGE;  Surgeon: Jalene Mullet, MD;  Location: [place];  Service: Ophthalmology;  Laterality: Right;  . AMPUTATION Right 04/01/2016   Procedure: Right Great Toe Amputation;  Surgeon: Newt Minion, MD;  Location: [place];  Service: Orthopedics;  Laterality: Right;  . AMPUTATION  Right 06/19/2016   Procedure: AMPUTATION SECOND TOE;  Surgeon: Marybelle Killings, MD;  Location: [place];  Service: Orthopedics;  Laterality: Right;  . BACK SURGERY     for abscess  . ICD IMPLANT N/A 01/15/2018   Procedure: ICD IMPLANT;  Surgeon: Deboraha Sprang, MD;  Location: [place] [facility];  Service: Cardiovascular;  Laterality: N/A;  . INJECTION OF SILICONE OIL Right [id number]   Procedure: INJECTION OF SILICONE OIL;  Surgeon: Jalene Mullet, MD;  Location: [place];  Service: Ophthalmology;  Laterality: Right;  . INJECTION OF SILICONE OIL Right [id number]   Procedure: INJECTION OF SILICONE OIL;  Surgeon: Jalene Mullet, MD;  Location: [place];  Service: Ophthalmology;  Laterality: Right;  . MEMBRANE PEEL Right 08/25/2020   Procedure: MEMBRANE PEEL;  Surgeon: Jalene Mullet, MD;  Location: [place];  Service: Ophthalmology;  Laterality: Right;  . PARS PLANA VITRECTOMY Right 07/14/2020   Procedure: PARS PLANA VITRECTOMY WITH 25 GAUGE, Membranetomy, drainage of subretinal fluid;  Surgeon: Jalene Mullet, MD;  Location: [place];  Service: Ophthalmology;  Laterality: Right;  . PARS PLANA VITRECTOMY Right 08/25/2020   Procedure: PARS PLANA VITRECTOMY WITH 25 GAUGE;  Surgeon: Jalene Mullet, MD;  Location: [place];  Service: Ophthalmology;  Laterality: Right;  . PHOTOCOAGULATION WITH LASER Right 07/14/2020   Procedure: PHOTOCOAGULATION WITH LASER;  Surgeon: Jalene Mullet, MD;  Location: [place];  Service: Ophthalmology;  Laterality: Right;  . PHOTOCOAGULATION WITH LASER  Right 08/25/2020   Procedure: PHOTOCOAGULATION WITH LASER;  Surgeon: Jalene Mullet, MD;  Location: [place];  Service: Ophthalmology;  Laterality: Right;  . 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   Procedure: REPAIR OF HEMORRHAGIC DETACHMENT;  Surgeon: Jalene Mullet, MD;  Location: [place];  Service: Ophthalmology;  Laterality: Right;  . RIGHT/LEFT HEART CATH AND CORONARY ANGIOGRAPHY N/A 01/31/2017   Procedure: Right/Left Heart Cath  and Coronary Angiography;  Surgeon: [place] Sine, MD;  Location: [place] [facility];  Service: Cardiovascular;  Laterality: N/A;  . SILICON OIL REMOVAL Right [id number]   Procedure: SILICON OIL REMOVAL;  Surgeon: Jalene Mullet, MD;  Location: [place];  Service: Ophthalmology;  Laterality: Right;    Current Outpatient Medications  Medication Sig Dispense Refill  . Accu-Chek Softclix Lancets lancets Use as instructed to check blood sugar TID. E11.65 100 each 2  . acetaminophen (TYLENOL) 325 MG tablet Take 650 mg by mouth every 6 (six) hours as needed for mild pain or moderate pain.    Marland Kitchen albuterol (PROVENTIL HFA;VENTOLIN HFA) 108 (90 Base) MCG/ACT inhaler Inhale 1-2 puffs into the lungs every 6 (six) hours as needed for wheezing or shortness of breath. 1 Inhaler 0  . aspirin EC 81 MG EC tablet Take 1 tablet (81 mg total) by mouth daily. 30 tablet 0  . benzonatate (TESSALON) 100 MG capsule Take 1 capsule (100 mg total) by mouth every 8 (eight) hours. 21 capsule 0  . Blood Glucose Monitoring Suppl (TRUE METRIX METER) DEVI Check blood sugar three times / day, before meals. 1 each 0  . carvedilol (COREG) 12.5 MG tablet Take 1 tablet (12.5 mg total) by mouth 2 (two) times daily with a meal. 180 tablet 1  . cetirizine (ZYRTEC) 10 MG tablet TAKE 1 TABLET (10 MG TOTAL) BY MOUTH DAILY. 30 tablet 0  . ferrous sulfate 325 (65 FE) MG tablet Take 1 tablet (325 mg total) by mouth 2 (two) times daily with a meal. 60 tablet 3  . furosemide (LASIX) 40 MG tablet Take 1.5 tablets (60 mg total) by mouth daily. 45 tablet 6  . gabapentin (NEURONTIN) 400 MG capsule TAKE 1 CAPSULE BY MOUTH 3 TAKE TIMES DAILY. 90 capsule 3  . glucose blood (ACCU-CHEK GUIDE) test strip Use as instructed to check blood sugar TID. E11.65 100 each 2  . hydrALAZINE (APRESOLINE) 25 MG tablet Take 1 tablet (25 mg total) by mouth 3 (three) times daily. 270 tablet 3  . insulin glargine (LANTUS SOLOSTAR) 100 UNIT/ML Solostar Pen Inject 60 Units into  the skin daily. 30 mL 3  . isosorbide mononitrate (IMDUR) 30 MG 24 hr tablet Take 1 tablet (30 mg total) by mouth daily. 90 tablet 3  . NOVOLOG 100 UNIT/ML injection INJECT 0-15 UNITS INTO THE SKIN 3 (THREE) TIMES DAILY WITH MEALS. SLIDING SCALE CBG 70 - 120: 0 UNITS: CBG 121 - 140: 2 UNITS; CBG 140 - 200:8 UNITS 10 mL 1  . ondansetron (ZOFRAN) 4 MG tablet Take 1 tablet (4 mg total) by mouth every 6 (six) hours as needed for nausea or vomiting. 12 tablet 0  . potassium chloride (KLOR-CON) 10 MEQ tablet Take 1 tablet (10 mEq total) by mouth daily. 90 tablet 1  . rosuvastatin (CRESTOR) 40 MG tablet Take 1 tablet (40 mg total) by mouth daily. 90 tablet 1  . sacubitril-valsartan (ENTRESTO) 49-51 MG Take 1 tablet by mouth 2 (two) times daily. Please make overdue appt with Dr. Caryl Comes before anymore refills. 3rd and  Final attempt 30 tablet 0  . spironolactone (ALDACTONE) 25 MG tablet Take 1 tablet (25 mg total) by mouth daily. 90 tablet 1  . TRUEPLUS PEN NEEDLES 32G X 4 MM MISC USE TO INJECT LANTUS DAILY. MUST USE NEW PEN NEEDLE WITH EACH INJECTION. (Patient taking differently: as directed.) 100 each 0   No current facility-administered medications for this visit.    Allergies:   Patient has no known allergies.   Social History: Social History   Socioeconomic History  . Marital status: Significant Other    Spouse name: Not on file  . Number of children: Not on file  . Years of education: Not on file  . Highest education level: Not on file  Occupational History  . Not on file  Tobacco Use  . Smoking status: Never Smoker  . Smokeless tobacco: Never Used  Vaping Use  . Vaping Use: Never used  Substance and Sexual Activity  . Alcohol use: No  . Drug use: No  . Sexual activity: Not on file  Other Topics Concern  . Not on file  Social History Narrative  . Not on file   Social Determinants of Health   Financial Resource Strain: Not on file  Food Insecurity: Not on file  Transportation  Needs: Not on file  Physical Activity: Not on file  Stress: Not on file  Social Connections: Not on file  Intimate Partner Violence: Not on file    Family History: Family History  Problem Relation Age of Onset  . Diabetes Mother   . Hypertension Mother   . Diabetes Father   . Heart attack Father   . Diabetes Sister   . Heart attack Maternal Grandmother     Review of Systems: All other systems reviewed and are otherwise negative except as noted above.   Physical Exam: There were no vitals filed for this visit.   GEN- The patient is well appearing, alert and oriented x 3 today.   HEENT: normocephalic, atraumatic; sclera clear, conjunctiva pink; hearing intact; oropharynx clear; neck supple, no JVP Lymph- no cervical lymphadenopathy Lungs- Clear to ausculation bilaterally, normal work of breathing.  No wheezes, rales, rhonchi Heart- Regular rate and rhythm, no murmurs, rubs or gallops, PMI not laterally displaced GI- soft, non-tender, non-distended, bowel sounds present, no hepatosplenomegaly Extremities- no clubbing or cyanosis. No edema; DP/PT/radial pulses 2+ bilaterally MS- no significant deformity or atrophy Skin- warm and dry, no rash or lesion; ICD pocket well healed Psych- euthymic mood, full affect Neuro- strength and sensation are intact  ICD interrogation- reviewed in detail today,  See PACEART report  EKG:  EKG is ordered today. The ekg ordered today shows NSR at 78 bpm, QRS 88 ms.  Recent Labs: 06/12/2020: ALT 16; B Natriuretic Peptide 23.2; Magnesium 1.6 12/22/2020: Hemoglobin 12.5; Platelets 225 01/27/2021: BUN 15; Creatinine, Ser 1.36; NT-Pro BNP 185; Potassium 3.8; Sodium 138   Wt Readings from Last 3 Encounters:  01/27/21 205 lb 6.4 oz (93.2 kg)  01/13/21 207 lb (93.9 kg)  12/22/20 195 lb (88.5 kg)     Other studies Reviewed: Additional studies/ records that were reviewed today include: Previous EP office notes. Most recent labs and echo.     Assessment and Plan:  1.  Chronic systolic dysfunction s/p Medtronic single chamber ICD  euvolemic today Stable on an appropriate medical regimen Normal ICD function See Pace Art report from last visit.   2. HTN About the same on hydralazine and imdur. Systolic BP ranges 123456 at home.  Increase Entresto to max dose.  3. Barostim Consideration Jeffrey [name]'s heart failure has failed to improve despite titration of guideline directed medication such that he qualifies for the [facility] NEO device. He has follow up today with Research to be screened for the [facility] approach.  If screen fails, would proceed with commercial if carotid anatomy appropriate.   4. Uncontrolled Diabetes Most recent Hgb A1c was 10.7.  This may affect his candidacy for procedures. Will follow closely.  Labs/ tests ordered today include:  No orders of the defined types were placed in this encounter.  Disposition:   Follow up with EP APP 3 months, sooner if proceeding with barostim.   Jacalyn Lefevre, PA-C  03/11/2021 11:36 AM  [facility] [address] [place] [address] [phone] (office) [phone] (fax)

## 2021-03-13 ENCOUNTER — Other Ambulatory Visit: Payer: Self-pay

## 2021-03-14 ENCOUNTER — Other Ambulatory Visit: Payer: Self-pay

## 2021-03-14 MED FILL — Sacubitril-Valsartan Tab 97-103 MG: ORAL | 30 days supply | Qty: 60 | Fill #0 | Status: CN

## 2021-03-14 MED FILL — Carvedilol Tab 12.5 MG: ORAL | 30 days supply | Qty: 60 | Fill #0 | Status: CN

## 2021-03-19 ENCOUNTER — Other Ambulatory Visit: Payer: Medicare Other

## 2021-03-22 ENCOUNTER — Other Ambulatory Visit: Payer: Self-pay

## 2021-03-30 ENCOUNTER — Other Ambulatory Visit: Payer: Self-pay

## 2021-03-30 ENCOUNTER — Ambulatory Visit (HOSPITAL_COMMUNITY)
Admission: RE | Admit: 2021-03-30 | Discharge: 2021-03-30 | Disposition: A | Discharge status: Home / Self Care | Payer: Medicare Other | Source: Ambulatory Visit | Attending: Internal Medicine | Admitting: Internal Medicine

## 2021-03-30 ENCOUNTER — Ambulatory Visit (HOSPITAL_BASED_OUTPATIENT_CLINIC_OR_DEPARTMENT_OTHER)
Admission: RE | Admit: 2021-03-30 | Discharge: 2021-03-30 | Disposition: A | Discharge status: Home / Self Care | Payer: Medicare Other | Source: Ambulatory Visit | Attending: Internal Medicine | Admitting: Internal Medicine

## 2021-03-30 ENCOUNTER — Encounter: Payer: Medicare Other | Admitting: *Deleted

## 2021-03-30 VITALS — BP 160/90 | HR 78 | Ht 65.75 in | Wt 206.0 lb

## 2021-03-30 DIAGNOSIS — Z006 Encounter for examination for normal comparison and control in clinical research program: Secondary | ICD-10-CM

## 2021-03-30 DIAGNOSIS — I5022 Chronic systolic (congestive) heart failure: Secondary | ICD-10-CM

## 2021-03-30 LAB — ECHOCARDIOGRAM COMPLETE
Area-P 1/2: 3.6 cm2
Calc EF: 39.2 %
S' Lateral: 4 cm
Single Plane A2C EF: 37.9 %
Single Plane A4C EF: 39 %

## 2021-03-30 NOTE — Research (Signed)
[place] Informed Consent   Subject Name: Jeffrey [name]  Subject met inclusion and exclusion criteria.  The informed consent form, study requirements and expectations were reviewed with the subject and questions and concerns were addressed prior to the signing of the consent form.  The subject verbalized understanding of the trial requirements.  The subject agreed to participate in the [facility] trial and signed the informed consent at 1050 on 03/30/2021.  The informed consent was obtained prior to performance of any protocol-specific procedures for the subject.  A copy of the signed informed consent was given to the subject and a copy was placed in the subject's medical record.   [name] D

## 2021-03-30 NOTE — Progress Notes (Signed)
Carotid duplex bilateral study completed.   Please see CV Proc for preliminary results.   Liz Pinho, RDMS, RVT  

## 2021-03-30 NOTE — Research (Signed)
Section A:  Administrative Section  Subject ID: _[id number]_ - _018_ - 015 Subject Initials: _C_ _L_ _R_  Date subject signed informed consent  _19_/_APR_/_2022__      (DD / MMM / YYYY)  Has the subject been previously screened?    '[x]'  No     '[]'   Yes                                                         Previous Subject ID _1 _2 _4 _5_ - __ __ __ - 015   Section B:  Enrollment Criteria  In the investigator's opinion does the subject meet the FDA Indication for Use:  '[x]'  Yes    '[]'  No (STOP - subject does not qualify for the study)  Has the subject been previously, or are currently, randomized in the CVRx BeAT-HF Trial? '[]'  Yes (STOP - subject does not qualify for the study)   '[x]'  No  Has the subject received cardiac resynchronization therapy (CRT) within six months of enrollment, or is actively receiving CRT? '[]'  Yes (STOP - subject does not qualify for the study)   '[x]'  No  Section C:  Serum Biomarkers   NT-proBNP: ___290_____ Date:    _19_/_APR_/_2022_                  (DD / MMM / [place] Bre[place])  eGFR:  _54_ mL/min/1.58m Date:    _19_/_APR_/_2022______                  (DD / MMM / [place] Breeding  Negative Pregnancy Test? '[x]'  N/A Date:    _____/_______/_______                  (DD / MMM / [place] Breeding   '[]'  Yes     '[]'  No   Section D:  Appropriate Surgical/Study Candidate:   Is the subject able to discontinue the use of antiplatelet drugs (e.g. aspirin) in advance of the procedure, if required? '[x]'  Yes   '[]'  No  Assessed by:   ________________________________ Implanting Physician '[]'  Yes Date:    _____/_______/_______                 (DD / MMM / [place] Breeding   '[x]'  No   Section E:  Inclusion/Exclusion Criteria  Does the subject meet all Inclusion Criteria? '[]'  Yes   '[x]'  No (STOP - subject does not qualify for the study)   Check all that apply   '[]'  1. Age at least 21 years and no more than 80 years at the time of enrollment.   '[x]'  2. Appropriate candidate for the surgery as determined by an evaluation from the implanting  physician using a carotid duplex ultrasound (CDU) [or Computed Tomography Angiography (CTA) if CDU inconclusive or incomplete] and a review of medical history (including existence of infections that may increase implant risk).  Evaluation must confirm the following within 45 days of the BAROSTIM NEO implant: . Appropriate medical condition and medical history for implantation of the BAROSTIM NEO System AND . Anatomy that enables this implant procedure, with no vascular structures or orientations or neck anomalies that would be obstructive to the implantation path AND . The artery planned for the BAROSTIM NEO implant must have: o A carotid bifurcation below the level of the mandible AND o No ulcerative carotid  arterial plaques AND o No carotid atherosclerosis producing a 30% or greater stenosis in linear diameter in the internal carotid AND o No carotid atherosclerosis producing a 30% or greater stenosis in linear diameter in the distal common carotid AND . Have had no prior surgery, radiation, or endovascular stent placement in the carotid artery or the carotid sinus region AND . Able to discontinue the use of antiplatelet drugs (e.g. aspirin) in advance of the procedure, if required   '[]'  3. Six-minute hall walk (6MHW) ? 150 m AND ? 400 m within 45 days prior to implant.   '[]'  4. Serum estimated glomerular filtration rate (eGFR) ? 25 mL/min/1.73 m2 using the CKD-EPI method within 45 days prior to the [facility] NEO implant.   '[]'  5. Body mass index ? 40 kg/m2 within 45 days prior to the [facility] NEO implant.   '[]'  6. If male and of childbearing potential, must use a medically accepted method of birth control (e.g., barrier method with spermicide, oral contraceptive, or abstinence) and agree to continue use of this method for the duration of the study. Women of childbearing potential must have a negative pregnancy test within 14 days prior to the [facility] NEO implant.   '[]'  7. Subjects implanted with a  cardiac rhythm management device that does not utilize an intracardiac lead, or implanted with a neurostimulation device, must be approved by the [place].   '[]'  8. At the end of screening/baseline, subject still meets the Barostim Neo Indication for Use   '[]'  9. Signed a CVRx-approved informed consent form for participation in this study.      Does the subject meet any Exclusion Criteria? '[]'  Yes (STOP - subject does not qualify for the study)   '[x]'  No     List criteria # met: __________________________   '[]'  1. Previously or currently randomized in the CVRx BeAT-HF Trial.   '[]'  2. Received cardiac resynchronization therapy (CRT) within six months of enrollment or is actively receiving CRT.   '[]'  3. Any of the following contraindications: . Baroreflex failure or autonomic neuropathy  . Uncontrolled, symptomatic cardiac bradyarrhythmias . Known allergy to silicone or titanium   '[]'  4. Unstable ventricular arrhythmias.   '[]'  5. Presence of baseline cranial nerve dysfunction at risk from cervical interventions on the carotid bifurcation determined by the Ear, Nose and Throat (ENT) examination.   '[]'  6. Subjects with any surgery that has occurred, or is planned to occur, within 45 days of the [facility] NEO implant.   '[]'  7. Recent history (within 6 months of implant) of significant and uncontrolled bleeding.   '[]'  8. Known and untreated hypercoagulability state.   '[]'  9. An inappropriate study candidate as evidenced by: Marland Kitchen Solid organ or hematologic transplant, or currently being evaluated for an organ transplant. . Has received or is receiving LVAD therapy or chronic dialysis. . Current or planned treatment with intravenous positive inotrope therapy. . Primary pulmonary hypertension. . Severe COPD or severe restrictive lung disease (e.g. requires chronic oral steroid use or home oxygen use). .  Heart failure secondary to a reversible cause, such as cardiac structural valvular disease, acute  myocarditis and pericardial constriction. . Clinically significant cardiac structural valvular disease. .  Unable or unwilling to fulfill the protocol medication compliance and follow-up requirements, for reasons including but not limited to an unresolved history of alcohol or substance abuse or psychiatric disorder. . Active malignancy. .  Any other serious medical condition that may adversely affect the safety of the  participant or validity of the study, in the opinion of the investigator. . Life expectancy less than one year.   '[]'  10. Any of the following within 3 months prior to the [facility] NEO implant. . Myocardial infarction . Unstable angina . Coronary intervention (e.g. CABG or PTCA)  . Cerebral vascular accident or transient ischemic attack . Sudden cardiac death  . Surgical cardiac intervention (e.g. cardiac ablation, valve replacement)   '[]'  11. Enrolled and active in another (e.g. device, pharmaceutical, or biological) clinical study unless approved by the [place].  Section F:  Adverse Events  Were there any Adverse Events that occurred since consent? '[]'  Yes (complete AE form)   '[x]'  No  Section H:  Medication Changes   Have there been any changes to the subject's home use medications for Arrhythmia, Antiplatelet/Anticoagulation and Heart failure medications during screening and baseline? '[]'  Yes (update Med form)   '[x]'  No  Section I:  Signature   Person completing form (Print) Name: [name] :) _______________  Signature: [name] :) ____ Date: _04/19/2022_________________________    SECTION A:  Administrative Section  Patient ID: _[id number]_ - _018_ - 015 Patient Initials: _C_ _L_ _R__  Visit Interval:    '[x]'  Screening/Baseline*    '[]'  Activation   '[]'  0.5 Month       '[]'  1 Month     '[]'  2 Month     '[]'  3 Month       '[]'  6 Month     '[]'  12 Month         '[]'  Unscheduled, reason for visit: _________________________________________  * For Screening  / Baseline only the questions are based on 30 days prior to consent.  SECTION B:  COVID-19 Like Illness Symptoms   Has the subject experienced any cold, flu or COVID-19 symptoms since the last study visit? '[x]'  No  (skip to section C)     '[]'  Yes, date of onset:  _____/_____ MMM/YYYY    If yes, check all symptoms that apply:   '[]'  Fevers or chills   '[]'  New or Worsening Cough  '[]'  Productive  '[]'  Dry    '[]'  If yes to cough, indicate severity  '[]'  Constant  '[]'  Occasional, several per hour   '[]'  New or worsened shortness of breath   '[]'  Diarrhea   '[]'  Altered or reduced sense of smell or taste   '[]'  Muscle aches/Severe Fatigue   '[]'  Chest pain or tightness   '[]'  Sore throat   '[]'  Nausea or vomiting  SECTION C:  COVID-19 Like Illness Testing   Has the patient been tested for COVID-19 since the last study visit? '[x]'  No     '[]'  Yes, date of test:  _____/_____ MMM/YYYY    If yes, test results:   '[]'  Positive '[]'  Negative '[]'  Unknown  Has the patient been tested for COVID-19 Antibodies since the last study visit? '[x]'  No     '[]'  Yes, date of test:  _____/_____ MMM/YYYY    If yes, test results:   '[]'  Positive '[]'  Negative '[]'  Unknown  Has the patient been vaccinated for COVID-19 since the last study visit? '[x]'  No     '[]'  Yes, date of test:  _____/_____ MMM/YYYY  Has the patient been tested for Influenza ("flu") since the last study visit? '[]'  No     '[]'  Yes, date of test:  _____/_____ MMM/YYYY    If yes, test results:   '[]'  Positive '[]'  Negative '[]'  Unknown  Has the  patient been vaccinated for Influenza ("flu") since the last study visit? '[x]'  No     '[]'  Yes, date of test:  _____/_____ MMM/YYYY  SECTION D:  COVID-19 Like Illness Exposure  Has the subject been told that they might have had COVID-19/have symptoms suggestive of COVID-19 since the last study visit? '[x]'  No   '[]'  Yes   '[]'  Unknown  Has the subject been exposed to anyone with known or suspected COVID-19 since the last study visit? '[x]'  No   '[]'  Yes   '[]'   Unknown  Has the subject been told that they might have had the "flu" or influenza since the last study visit? '[x]'  No   '[]'  Yes   '[]'  Unknown  SECTION E:  Effects of COVID Pandemic on [place] Interactions  Since the last study visit, did the subject feel the need to go to an emergency department or hospital for their heart failure but decided not to because of concerns about COVID-19? '[x]'  No   '[]'  Yes   '[]'  Unknown    If yes, how did they seek care (check all that apply):   '[]'  Telemedicine visit '[]'  In-person '[]'  Clinic '[]'  Urgent Care   '[]'  Subject did not change hospital/ER use due to COVID-19 '[]'  Other, specify: ________________________  Since the last study visit, did the subject have a cardiology/HF related appointment cancelled/rescheduled due to COVID-19 pandemic? '[x]'  No   '[]'  Yes, how many? ___________   '[]'  Unknown  Since the last study visit, did the subject have any cardiology/HF related telemedicine visit due to COVID-19 pandemic? '[x]'  No   '[]'  Yes, how many? ___________   '[]'  Unknown  Since the last study visit, did the subject have a cardiology/HF procedure cancelled/rescheduled due to COVID-19 pandemic? '[x]'  No   '[]'  Yes, how many? ___________   '[]'  Unknown  SECTION F:  Effects of COVID Pandemic on Subject's Medications  Since the last visit, did any of their heart failure medications change or stop, even for a short time?   If yes, enter change into medication eCRF '[x]'  No   '[]'  Yes, how many? ___________   '[]'  Unknown    If yes, why:   '[]'  Instructed by Doctor '[]'  Self-discontinued '[]'  Unknown  Other: ________________  Since the last visit, was the subject prescribed any medications for COVID-19? '[x]'  No   '[]'  Yes   '[]'  Unknown    If yes, what medications (generic name):  SECTION G:  Effects of COVID Pandemic on Subject's Lifestyle  How has the subject's activity/exercise level changed due to COVID-19 pandemic? '[x]'  No change   '[]'  More activity/exercise   '[]'  Less  activity/exercise  How has the subject's smoking habits changed due to COVID-19? '[x]'  Does not smoke   '[]'  No change   '[]'  Smoke more   '[]'  Smoke less  How has the subject's alcohol drinking habits changed due to COVID-19? '[x]'  Does not drink   '[]'  No change   '[]'  Drink more   '[]'  Drink less  Section H:  Signature    Person completing form (Print Name): [name] :) __________  Signature: [name] :) _____ Date: __04/19/2022________________________     Six Minute Hall Walk Test Date:    _04_/_19_/_2022______  (DD / MMM / [place])  Distance Walked _381__ meters  Were there any devices used to assist the subject in walking (e.g. cane, walker, etc.)? '[x]'  No   '[]'  Yes specify:_______________________  Was the walk terminated before 6 minutes? '[x]'  No   '[]'   Yes specify reason: (select all that apply)    '[]'  Angina    '[]'   Dyspnea    '[]'   Fatigue    '[]'   Dizziness    '[]'   Syncope    '[]'   Other, specify:  Name of person conducting 6-Minute [place] Walk: [name] :) _______   Page 1 of 1     Confidential   Form Protocol [id number] UMP. B dated 18-Sep-2019   Effective Date: 31-Oct-2019   Subject ID: _[id number]_ - _018_ - 015 Subject Initials: _C_ _L_ _R_ Visit Interval:  '[x]'   Baseline     '[]'  6 Month  Section B:  NYHA   NYHA Classification Date:    _____/_______/_______  (DD / MMM / [place])  Select One Class Subject Symptoms  '[]'  I No limitations of physical activity, No undue fatigue, palpitation or dyspnea  '[x]'  II Slight limitation of physical activity, Comfortable at rest, Less than ordinary activity results in fatigue, Palpitation, or dyspnea  '[]'  III Marked limitation of physical activity, Comfortable at rest, Less than ordinary activity results in fatigue, palpitation, or dyspnea  '[]'  IV Unable to carry out any physical activity without discomfort, Symptoms of cardiac insufficiency at rest, Physical activity causes increased discomfort  Name of person conducting NYHA:  [name] :) ________________   Section A:  Administrative Section  Subject ID: _[id number]_ - _018_ - 015 Subject Initials: _C_ _L_ _R__  Visit Interval:    '[x]'  Screening/Baseline    '[]'  Activation   '[]'  0.5 Month       '[]'  1 Month     '[]'  2 Month     '[]'  3 Month       '[]'  6 Month     '[]'  12 Month         '[]'  Unscheduled, reason for visit: _________________________________________  Section B:  Physical Assessment   Date:    _19_/_Apr_/_2022__   (DD / MMM / [place])  Weight: _206_  '[]'  kg    '[x]'  pounds Height (Screening Visit Only): _167_  '[x]'  cm '[]'  inches  Blood Pressure: _160_  / _90_mmHg Heart Rate: __78________ bpm  Section E:  Signature     Person completing form (Print Name): [name] :) _________  Signature: [name] :) ____ Date: __04/19/2022___       Patient ID: _[id number]_ - _018_ - 015 Patient Initials: _C_ _L_ _R_ Visit Interval:  '[x]'   Baseline     '[]'  6 Month  Section D:  MLWHF   [place] Living With Heart Failure Questionnaire Date:    _19_/_APR_/_2022_  (DD / MMM / YYYY)  These questions concern how your heart failure (heart condition) has prevented you from living as you wanted during the last month.  These items listed below describe different ways some people are affected.  If you are sure an item does not apply to you or is not related to your heart failure then circle 0 (no) and go on to the next item.  If an item does apply to you, then circle the number rating how much it prevented you from living as you wanted.  Did your heart failure prevent you from living as you wanted during the last month by:   No Very little    Very much  1. Causing swelling in your ankles, legs, etc.? 0'[]'  1'[]'  2'[x]'  3'[]'  4'[]'  5'[]'   2. Making you sit or lie down to rest during the day? 0'[]'  1'[]'  2'[]'  3'[]'  4'[x]'  5'[]'   3. Making your  walking about or climbing stairs difficult? 0'[]'  1'[]'  2'[]'  3'[x]'  4'[]'  5'[]'   4. Making your working around the house or yard difficult? 0'[]'  1'[x]'  2'[]'  3'[]'  4'[]'  5'[]'    5. Making your going places away from home difficult? 0'[]'  1'[x]'  2'[]'  3'[]'  4'[]'  5'[]'   6. Making your sleeping well at night difficult? 0'[]'  1'[]'  2'[x]'  3'[]'  4'[]'  5'[]'   7. Making your relating to or doing things with your friends or family difficult? 0'[x]'  1'[]'  2'[]'   3'[]'  4'[]'  5'[]'   8. Making your working to earn a living difficult? 0'[x]'  1'[]'  2'[]'  3'[]'  4'[]'  5'[]'   9. Making your recreational pastimes, sports or hobbies difficult? 0'[]'  1'[x]'  2'[]'  3'[]'  4'[]'  5'[]'   10. Making your sexual activities difficult? 0'[x]'  1'[]'  2'[]'  3'[]'  4'[]'  5'[]'   11. Making you eat less of the foods you like? 0'[]'  1'[x]'  2'[]'  3'[]'  4'[]'  5'[]'   12. Making you short of breath? 0'[]'  1'[]'  2'[]'  3'[x]'  4'[]'  5'[]'   13. Making you tired, fatigued, or low on energy? 0'[]'  1'[x]'  2'[]'  3'[]'  4'[]'  5'[]'   14. Making you stay in a hospital? 0'[x]'  1'[]'  2'[]'  3'[]'  4'[]'  5'[]'   15. Costing you money for medical care? 0'[x]'  1'[]'  2'[]'  3'[]'  4'[]'  5'[]'   16. Giving you side effects from medications? 0'[x]'  1'[]'  2'[]'  3'[]'  4'[]'  5'[]'   17. Making you feel you are a burden to your family or friends? 0'[x]'  1'[]'  2'[]'  3'[]'  4'[]'  5'[]'   18. Making you feel a loss of self-control in your life? 0'[x]'  1'[]'  2'[]'  3'[]'  4'[]'  5'[]'   19. Making you worry? 0'[]'  1'[x]'  2'[]'  3'[]'  4'[]'  5'[]'   20. Making it difficult for you to concentrate or remember things? 0'[]'  1'[]'  2'[x]'  3'[]'  4'[]'  5'[]'   21 Making you feel depressed? 0'[x]'  1'[]'  2'[]'  3'[]'  4'[]'  5'[]'   Section E:  Signature Section  Person Administering Questionnaire Name: (person who read first question and handed questionnaire to subject) [name] :) ____ (Print) 03/30/2021   [name] :) ____ (Sign) (Date)  Person Completing Questionnaire Name: (e.g. subject, person reading questionnaire, etc.) [name] :) __ (Print) 03/30/2021   [name] :) ______ (Sign) (Date)

## 2021-03-30 NOTE — Progress Notes (Signed)
  Echocardiogram 2D Echocardiogram has been performed.  Jeffrey [name] 03/30/2021, 1:08 PM

## 2021-03-31 ENCOUNTER — Ambulatory Visit (INDEPENDENT_AMBULATORY_CARE_PROVIDER_SITE_OTHER): Payer: Medicaid Other

## 2021-03-31 DIAGNOSIS — I428 Other cardiomyopathies: Secondary | ICD-10-CM

## 2021-03-31 LAB — BASIC METABOLIC PANEL
BUN/Creatinine Ratio: 13 (ref 9–20)
BUN: 19 mg/dL (ref 6–24)
CO2: 21 mmol/L (ref 20–29)
Calcium: 8.6 mg/dL — ABNORMAL LOW (ref 8.7–10.2)
Chloride: 101 mmol/L (ref 96–106)
Creatinine, Ser: 1.5 mg/dL — ABNORMAL HIGH (ref 0.76–1.27)
Glucose: 166 mg/dL — ABNORMAL HIGH (ref 65–99)
Potassium: 3.2 mmol/L — ABNORMAL LOW (ref 3.5–5.2)
Sodium: 140 mmol/L (ref 134–144)
eGFR: 54 mL/min/{1.73_m2} — ABNORMAL LOW (ref >59)

## 2021-03-31 LAB — PRO B NATRIURETIC PEPTIDE: NT-Pro BNP: 290 pg/mL — ABNORMAL HIGH (ref 0–210)

## 2021-04-01 LAB — CUP PACEART REMOTE DEVICE CHECK
Battery Remaining Longevity: 101 mo
Battery Voltage: 3 V
Brady Statistic RV Percent Paced: 0.01 %
Date Time Interrogation Session: 20220420044224
HighPow Impedance: 57 Ohm
Implantable Lead Implant Date: 20190204
Implantable Lead Location: 753860
Implantable Pulse Generator Implant Date: 20190204
Lead Channel Impedance Value: 266 Ohm
Lead Channel Impedance Value: 342 Ohm
Lead Channel Pacing Threshold Amplitude: 1.5 V
Lead Channel Pacing Threshold Pulse Width: 0.4 ms
Lead Channel Sensing Intrinsic Amplitude: 9.375 mV
Lead Channel Sensing Intrinsic Amplitude: 9.375 mV
Lead Channel Setting Pacing Amplitude: 3 V
Lead Channel Setting Pacing Pulse Width: 0.4 ms
Lead Channel Setting Sensing Sensitivity: 0.3 mV

## 2021-04-02 ENCOUNTER — Other Ambulatory Visit: Payer: Self-pay

## 2021-04-02 MED FILL — Insulin Aspart Inj 100 Unit/ML: SUBCUTANEOUS | 28 days supply | Qty: 10 | Fill #0 | Status: AC

## 2021-04-02 MED FILL — Insulin Glargine Soln Pen-Injector 100 Unit/ML: SUBCUTANEOUS | 30 days supply | Qty: 18 | Fill #0 | Status: AC

## 2021-04-07 ENCOUNTER — Other Ambulatory Visit: Payer: Self-pay

## 2021-04-12 ENCOUNTER — Encounter: Payer: Medicare Other | Admitting: Surgery

## 2021-04-15 ENCOUNTER — Other Ambulatory Visit: Payer: Self-pay

## 2021-04-15 MED FILL — Gabapentin Cap 400 MG: ORAL | 30 days supply | Qty: 90 | Fill #0 | Status: CN

## 2021-04-15 MED FILL — Spironolactone Tab 25 MG: ORAL | 90 days supply | Qty: 90 | Fill #0 | Status: CN

## 2021-04-15 MED FILL — Furosemide Tab 40 MG: ORAL | 30 days supply | Qty: 45 | Fill #0 | Status: CN

## 2021-04-15 MED FILL — Carvedilol Tab 12.5 MG: ORAL | 90 days supply | Qty: 180 | Fill #0 | Status: CN

## 2021-04-15 MED FILL — Sacubitril-Valsartan Tab 97-103 MG: ORAL | 90 days supply | Qty: 180 | Fill #0 | Status: CN

## 2021-04-15 MED FILL — Potassium Chloride Tab ER 10 mEq: ORAL | 90 days supply | Qty: 90 | Fill #0 | Status: CN

## 2021-04-15 MED FILL — Rosuvastatin Calcium Tab 40 MG: ORAL | 90 days supply | Qty: 90 | Fill #0 | Status: CN

## 2021-04-15 MED FILL — Isosorbide Mononitrate Tab ER 24HR 30 MG: ORAL | 90 days supply | Qty: 90 | Fill #0 | Status: CN

## 2021-04-15 MED FILL — Hydralazine HCl Tab 25 MG: ORAL | 90 days supply | Qty: 270 | Fill #0 | Status: CN

## 2021-04-16 ENCOUNTER — Other Ambulatory Visit: Payer: Self-pay

## 2021-04-19 ENCOUNTER — Encounter: Payer: Medicare Other | Admitting: Surgery

## 2021-04-19 NOTE — Progress Notes (Signed)
Remote ICD transmission.   

## 2021-04-22 ENCOUNTER — Other Ambulatory Visit: Payer: Self-pay

## 2021-04-23 ENCOUNTER — Other Ambulatory Visit: Payer: Self-pay

## 2021-04-26 ENCOUNTER — Other Ambulatory Visit: Payer: Self-pay

## 2021-04-26 MED FILL — Isosorbide Mononitrate Tab ER 24HR 30 MG: ORAL | 90 days supply | Qty: 90 | Fill #0 | Status: AC

## 2021-04-26 MED FILL — Insulin Glargine Soln Pen-Injector 100 Unit/ML: SUBCUTANEOUS | 30 days supply | Qty: 18 | Fill #1 | Status: AC

## 2021-04-26 MED FILL — Gabapentin Cap 400 MG: ORAL | 30 days supply | Qty: 90 | Fill #0 | Status: AC

## 2021-04-26 MED FILL — Hydralazine HCl Tab 25 MG: ORAL | 90 days supply | Qty: 270 | Fill #0 | Status: AC

## 2021-04-26 MED FILL — Carvedilol Tab 12.5 MG: ORAL | 90 days supply | Qty: 180 | Fill #0 | Status: AC

## 2021-04-26 MED FILL — Furosemide Tab 40 MG: ORAL | 30 days supply | Qty: 45 | Fill #0 | Status: AC

## 2021-04-28 ENCOUNTER — Other Ambulatory Visit: Payer: Self-pay | Admitting: Pharmacist

## 2021-04-28 ENCOUNTER — Other Ambulatory Visit: Payer: Self-pay

## 2021-04-28 MED ORDER — INSULIN ASPART 100 UNIT/ML IJ SOLN
INTRAMUSCULAR | 0 refills | Status: DC
  Filled 2021-04-28 (×2): qty 10, 22d supply, fill #0
  Filled 2021-07-27: qty 10, 41d supply, fill #0

## 2021-05-05 ENCOUNTER — Other Ambulatory Visit: Payer: Self-pay

## 2021-05-14 ENCOUNTER — Telehealth: Payer: Self-pay

## 2021-05-14 NOTE — Telephone Encounter (Signed)
Jeffrey [name] failed screening for the [facility] study and has been recommended for consideration of a Barostim device implant.  Dr. Trula Slade has seen the patient in the Research department at [facility] and can complete a virtual visit with the patient in light of possible challenges on the patient's part in arriving to in-person visits (no show x 2). Today, I left a message for the patient on his cell phone voicemail to call back and schedule an in-person or virtual visit with Dr. Trula Slade.

## 2021-05-17 ENCOUNTER — Telehealth: Payer: Self-pay | Admitting: Surgery

## 2021-05-31 ENCOUNTER — Ambulatory Visit: Payer: Medicare Other | Admitting: Surgery

## 2021-06-15 ENCOUNTER — Encounter: Payer: Medicare Other | Admitting: Student

## 2021-06-15 NOTE — Progress Notes (Deleted)
Electrophysiology Office Note Date: 06/15/2021  ID:  Jeffrey [name], DOB [birth year], MRN [id number]  PCP: Charlott Rakes, MD Primary Cardiologist: Mertie Moores, MD Electrophysiologist: Virl Axe, MD   CC: Routine ICD follow-up  Jeffrey [name] is a 55 y.o. male seen today for Virl Axe, MD for routine electrophysiology followup.  Since last being seen in our clinic the patient reports doing ***.  he denies chest pain, palpitations, dyspnea, PND, orthopnea, nausea, vomiting, dizziness, syncope, edema, weight gain, or early satiety. He has not had ICD shocks.   Device History: Medtronic Single Chamber ICD implanted 01/2018 for NICM and syncope History of appropriate therapy: No History of AAD therapy: No   Past Medical History:  Diagnosis Date   AICD (automatic cardioverter/defibrillator) present    Medtronic   CAD (coronary artery disease)    a. cath 01/31/17: 60% 1st RPLB, 60% dist RCA, 55% prox RCA, 10% pro LAD --> Rx TX.    Chronic systolic CHF (congestive heart failure) ([place]) 01/28/2017   1. Echo 01/29/17:  EF 20-25, normal wall motion, mild LAE // 2. EF 10-15 by [place] 01/2017    Diabetes mellitus    type II   Diabetic foot infection ([place]) 03/2016   RT FOOT   Dyspnea    History of kidney stones    passed   HTN (hypertension)    Hyperlipidemia    NICM (nonischemic cardiomyopathy) ([place]) 02/15/2017   1. Mod non-obs CAD on LHC in 01/2017 - CAD does not explain cardiomyopathy   Past Surgical History:  Procedure Laterality Date   AIR/FLUID EXCHANGE Right 07/14/2020   Procedure: AIR/FLUID EXCHANGE;  Surgeon: Jalene Mullet, MD;  Location: [place];  Service: Ophthalmology;  Laterality: Right;   AMPUTATION Right 04/01/2016   Procedure: Right Great Toe Amputation;  Surgeon: Newt Minion, MD;  Location: [place];  Service: Orthopedics;  Laterality: Right;   AMPUTATION Right 06/19/2016   Procedure: AMPUTATION SECOND TOE;  Surgeon: Marybelle Killings, MD;  Location: [place];  Service:  Orthopedics;  Laterality: Right;   BACK SURGERY     for abscess   ICD IMPLANT N/A 01/15/2018   Procedure: ICD IMPLANT;  Surgeon: Deboraha Sprang, MD;  Location: [place] [place] [facility];  Service: Cardiovascular;  Laterality: N/A;   INJECTION OF SILICONE OIL Right [id number]   Procedure: INJECTION OF SILICONE OIL;  Surgeon: Jalene Mullet, MD;  Location: [place];  Service: Ophthalmology;  Laterality: Right;   INJECTION OF SILICONE OIL Right [id number]   Procedure: INJECTION OF SILICONE OIL;  Surgeon: Jalene Mullet, MD;  Location: [place];  Service: Ophthalmology;  Laterality: Right;   MEMBRANE PEEL Right 08/25/2020   Procedure: MEMBRANE PEEL;  Surgeon: Jalene Mullet, MD;  Location: [place];  Service: Ophthalmology;  Laterality: Right;   PARS PLANA VITRECTOMY Right 07/14/2020   Procedure: PARS PLANA VITRECTOMY WITH 25 GAUGE, Membranetomy, drainage of subretinal fluid;  Surgeon: Jalene Mullet, MD;  Location: [place];  Service: Ophthalmology;  Laterality: Right;   PARS PLANA VITRECTOMY Right 08/25/2020   Procedure: PARS PLANA VITRECTOMY WITH 25 GAUGE;  Surgeon: Jalene Mullet, MD;  Location: [place];  Service: Ophthalmology;  Laterality: Right;   PHOTOCOAGULATION WITH LASER Right 07/14/2020   Procedure: PHOTOCOAGULATION WITH LASER;  Surgeon: Jalene Mullet, MD;  Location: [place];  Service: Ophthalmology;  Laterality: Right;   PHOTOCOAGULATION WITH LASER Right 08/25/2020   Procedure: PHOTOCOAGULATION WITH LASER;  Surgeon: Jalene Mullet, MD;  Location: [place];  Service: Ophthalmology;  Laterality: Right;  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   Procedure: REPAIR OF HEMORRHAGIC DETACHMENT;  Surgeon: Jalene Mullet, MD;  Location: [place];  Service: Ophthalmology;  Laterality: Right;   RIGHT/LEFT HEART CATH AND CORONARY ANGIOGRAPHY N/A 01/31/2017   Procedure: Right/Left Heart Cath and Coronary Angiography;  Surgeon: Troy Sine, MD;  Location: [place] [facility];  Service: Cardiovascular;   Laterality: N/A;   SILICON OIL REMOVAL Right [id number]   Procedure: SILICON OIL REMOVAL;  Surgeon: Jalene Mullet, MD;  Location: [place] [facility];  Service: Ophthalmology;  Laterality: Right;    Current Outpatient Medications  Medication Sig Dispense Refill   Accu-Chek Softclix Lancets lancets Use as instructed to check blood sugar TID. E11.65 100 each 2   acetaminophen (TYLENOL) 325 MG tablet Take 650 mg by mouth every 6 (six) hours as needed for mild pain or moderate pain.     albuterol (PROVENTIL HFA;VENTOLIN HFA) 108 (90 Base) MCG/ACT inhaler Inhale 1-2 puffs into the lungs every 6 (six) hours as needed for wheezing or shortness of breath. 1 Inhaler 0   aspirin EC 81 MG EC tablet Take 1 tablet (81 mg total) by mouth daily. 30 tablet 0   benzonatate (TESSALON) 100 MG capsule TAKE 1 CAPSULE BY MOUTH EVERY 8 HOURS 21 capsule 0   Blood Glucose Monitoring Suppl (TRUE METRIX METER) DEVI Check blood sugar three times / day, before meals. 1 each 0   carvedilol (COREG) 12.5 MG tablet TAKE 1 TABLET (12.5 MG TOTAL) BY MOUTH 2 (TWO) TIMES DAILY WITH A MEAL. 180 tablet 3   cetirizine (ZYRTEC) 10 MG tablet TAKE 1 TABLET (10 MG TOTAL) BY MOUTH DAILY. 30 tablet 0   ferrous sulfate 325 (65 FE) MG tablet Take 1 tablet (325 mg total) by mouth 2 (two) times daily with a meal. 60 tablet 3   furosemide (LASIX) 40 MG tablet TAKE 1.5 TABLETS (60 MG TOTAL) BY MOUTH DAILY. 45 tablet 6   gabapentin (NEURONTIN) 400 MG capsule TAKE 1 CAPSULE BY MOUTH 3 TAKE TIMES DAILY. 90 capsule 3   glucose blood (ACCU-CHEK GUIDE) test strip Use as instructed to check blood sugar TID. E11.65 100 each 2   hydrALAZINE (APRESOLINE) 25 MG tablet TAKE 1 TABLET (25 MG TOTAL) BY MOUTH 3 (THREE) TIMES DAILY. 270 tablet 3   insulin aspart (NOVOLOG) 100 UNIT/ML injection INJECT 0-15 UNITS INTO THE SKIN 3 (THREE) TIMES DAILY WITH MEALS. SLIDING SCALE CBG 70 - 120: 0 UNITS: CBG 121 - 140: 2 UNITS; CBG 140 - 200:8 UNITS 10 mL 0   insulin glargine (LANTUS)  100 UNIT/ML Solostar Pen INJECT 60 UNITS INTO THE SKIN DAILY. 30 mL 3   isosorbide mononitrate (IMDUR) 30 MG 24 hr tablet TAKE 1 TABLET (30 MG TOTAL) BY MOUTH DAILY. 90 tablet 3   ondansetron (ZOFRAN) 4 MG tablet Take 1 tablet (4 mg total) by mouth every 6 (six) hours as needed for nausea or vomiting. 12 tablet 0   potassium chloride (KLOR-CON) 10 MEQ tablet TAKE 1 TABLET (10 MEQ TOTAL) BY MOUTH DAILY. 90 tablet 1   rosuvastatin (CRESTOR) 40 MG tablet TAKE 1 TABLET (40 MG TOTAL) BY MOUTH DAILY. 90 tablet 1   sacubitril-valsartan (ENTRESTO) 97-103 MG TAKE 1 TABLET BY MOUTH 2 (TWO) TIMES DAILY. 180 tablet 3   spironolactone (ALDACTONE) 25 MG tablet TAKE 1 TABLET (25 MG TOTAL) BY MOUTH DAILY. 90 tablet 1   TRUEPLUS PEN NEEDLES 32G X 4 MM MISC USE TO INJECT LANTUS DAILY. MUST USE NEW  PEN NEEDLE WITH EACH INJECTION. (Patient taking differently: as directed.) 100 each 0   No current facility-administered medications for this visit.    Allergies:   Patient has no known allergies.   Social History: Social History   Socioeconomic History   Marital status: Significant Other    Spouse name: Not on file   Number of children: Not on file   Years of education: Not on file   Highest education level: Not on file  Occupational History   Not on file  Tobacco Use   Smoking status: Never   Smokeless tobacco: Never  Vaping Use   Vaping Use: Never used  Substance and Sexual Activity   Alcohol use: No   Drug use: No   Sexual activity: Not on file  Other Topics Concern   Not on file  Social History Narrative   Not on file   Social Determinants of Health   Financial Resource Strain: Not on file  Food Insecurity: Not on file  Transportation Needs: Not on file  Physical Activity: Not on file  Stress: Not on file  Social Connections: Not on file  Intimate Partner Violence: Not on file    Family History: Family History  Problem Relation Age of Onset   Diabetes Mother    Hypertension Mother     Diabetes Father    Heart attack Father    Diabetes Sister    Heart attack Maternal Grandmother     Review of Systems: All other systems reviewed and are otherwise negative except as noted above.   Physical Exam: There were no vitals filed for this visit.   GEN- The patient is well appearing, alert and oriented x 3 today.   HEENT: normocephalic, atraumatic; sclera clear, conjunctiva pink; hearing intact; oropharynx clear; neck supple, no JVP Lymph- no cervical lymphadenopathy Lungs- Clear to ausculation bilaterally, normal work of breathing.  No wheezes, rales, rhonchi Heart- Regular rate and rhythm, no murmurs, rubs or gallops, PMI not laterally displaced GI- soft, non-tender, non-distended, bowel sounds present, no hepatosplenomegaly Extremities- no clubbing or cyanosis. No edema; DP/PT/radial pulses 2+ bilaterally MS- no significant deformity or atrophy Skin- warm and dry, no rash or lesion; ICD pocket well healed Psych- euthymic mood, full affect Neuro- strength and sensation are intact  ICD interrogation- reviewed in detail today,  See PACEART report  EKG:  EKG is not ordered today.  Recent Labs: 12/22/2020: Hemoglobin 12.5; Platelets 225 03/30/2021: BUN 19; Creatinine, Ser 1.50; NT-Pro BNP 290; Potassium 3.2; Sodium 140   Wt Readings from Last 3 Encounters:  03/30/21 206 lb (93.4 kg)  03/11/21 206 lb (93.4 kg)  01/27/21 205 lb 6.4 oz (93.2 kg)     Other studies Reviewed: Additional studies/ records that were reviewed today include: Previous EP office notes   Assessment and Plan:  1.  Chronic systolic dysfunction s/p Medtronic single chamber ICD  euvolemic today Stable on an appropriate medical regimen Normal ICD function See [facility] report No changes today He meets criteria for Barostim implantation. He failed Batwire screening and was recommended for commercial implant but has been non-compliant with follow up.   2. HTN Continue hydralazine and  imdur Continue Entresto 97/103 mg BID  3. Uncontrolled diabetes Needs follow up with PCP Risk of infection from any procedures would be higher without better control.    Current medicines are reviewed at length with the patient today.   The patient {ACTIONS; HAS/DOES NOT HAVE:19233} concerns regarding his medicines.  The following changes were  made today:  {NONE DEFAULTED:18576}  Labs/ tests ordered today include: *** No orders of the defined types were placed in this encounter.  Disposition:   Follow up with EP APP  6 months. Sooner if he is able to proceed with Barostim.    Jacalyn Lefevre, PA-C  06/15/2021 9:16 AM  [facility] [address] [place] [address] [phone] (office) [phone] (fax)

## 2021-06-30 ENCOUNTER — Other Ambulatory Visit: Payer: Self-pay

## 2021-06-30 ENCOUNTER — Inpatient Hospital Stay (HOSPITAL_COMMUNITY)
Admission: EM | Admit: 2021-06-30 | Discharge: 2021-07-02 | DRG: 637 | Disposition: A | Discharge status: Home / Self Care | Payer: Medicare Other | Attending: Family Medicine | Admitting: Family Medicine

## 2021-06-30 ENCOUNTER — Encounter (HOSPITAL_COMMUNITY): Payer: Self-pay | Admitting: Internal Medicine

## 2021-06-30 ENCOUNTER — Emergency Department (HOSPITAL_COMMUNITY): Payer: Medicare Other

## 2021-06-30 ENCOUNTER — Ambulatory Visit (INDEPENDENT_AMBULATORY_CARE_PROVIDER_SITE_OTHER): Payer: Medicare Other

## 2021-06-30 DIAGNOSIS — E876 Hypokalemia: Secondary | ICD-10-CM | POA: Diagnosis present

## 2021-06-30 DIAGNOSIS — E1165 Type 2 diabetes mellitus with hyperglycemia: Secondary | ICD-10-CM | POA: Diagnosis present

## 2021-06-30 DIAGNOSIS — I639 Cerebral infarction, unspecified: Secondary | ICD-10-CM | POA: Diagnosis not present

## 2021-06-30 DIAGNOSIS — N184 Chronic kidney disease, stage 4 (severe): Secondary | ICD-10-CM | POA: Diagnosis present

## 2021-06-30 DIAGNOSIS — I428 Other cardiomyopathies: Secondary | ICD-10-CM | POA: Diagnosis not present

## 2021-06-30 DIAGNOSIS — I5042 Chronic combined systolic (congestive) and diastolic (congestive) heart failure: Secondary | ICD-10-CM | POA: Diagnosis present

## 2021-06-30 DIAGNOSIS — R9431 Abnormal electrocardiogram [ECG] [EKG]: Secondary | ICD-10-CM | POA: Diagnosis present

## 2021-06-30 DIAGNOSIS — R778 Other specified abnormalities of plasma proteins: Secondary | ICD-10-CM | POA: Diagnosis present

## 2021-06-30 DIAGNOSIS — N1832 Chronic kidney disease, stage 3b: Secondary | ICD-10-CM | POA: Diagnosis not present

## 2021-06-30 DIAGNOSIS — I252 Old myocardial infarction: Secondary | ICD-10-CM

## 2021-06-30 DIAGNOSIS — Z6832 Body mass index (BMI) 32.0-32.9, adult: Secondary | ICD-10-CM

## 2021-06-30 DIAGNOSIS — I2510 Atherosclerotic heart disease of native coronary artery without angina pectoris: Secondary | ICD-10-CM | POA: Diagnosis present

## 2021-06-30 DIAGNOSIS — Z95810 Presence of automatic (implantable) cardiac defibrillator: Secondary | ICD-10-CM

## 2021-06-30 DIAGNOSIS — N179 Acute kidney failure, unspecified: Secondary | ICD-10-CM | POA: Diagnosis present

## 2021-06-30 DIAGNOSIS — D631 Anemia in chronic kidney disease: Secondary | ICD-10-CM | POA: Diagnosis present

## 2021-06-30 DIAGNOSIS — E872 Acidosis: Secondary | ICD-10-CM | POA: Diagnosis present

## 2021-06-30 DIAGNOSIS — Z28310 Unvaccinated for covid-19: Secondary | ICD-10-CM

## 2021-06-30 DIAGNOSIS — I248 Other forms of acute ischemic heart disease: Secondary | ICD-10-CM | POA: Diagnosis present

## 2021-06-30 DIAGNOSIS — Z9114 Patient's other noncompliance with medication regimen: Secondary | ICD-10-CM

## 2021-06-30 DIAGNOSIS — E1100 Type 2 diabetes mellitus with hyperosmolarity without nonketotic hyperglycemic-hyperosmolar coma (NKHHC): Secondary | ICD-10-CM | POA: Diagnosis not present

## 2021-06-30 DIAGNOSIS — Z79899 Other long term (current) drug therapy: Secondary | ICD-10-CM

## 2021-06-30 DIAGNOSIS — I130 Hypertensive heart and chronic kidney disease with heart failure and stage 1 through stage 4 chronic kidney disease, or unspecified chronic kidney disease: Secondary | ICD-10-CM | POA: Diagnosis present

## 2021-06-30 DIAGNOSIS — Z794 Long term (current) use of insulin: Secondary | ICD-10-CM

## 2021-06-30 DIAGNOSIS — E669 Obesity, unspecified: Secondary | ICD-10-CM | POA: Diagnosis present

## 2021-06-30 DIAGNOSIS — R739 Hyperglycemia, unspecified: Secondary | ICD-10-CM | POA: Diagnosis present

## 2021-06-30 DIAGNOSIS — E1122 Type 2 diabetes mellitus with diabetic chronic kidney disease: Secondary | ICD-10-CM | POA: Diagnosis present

## 2021-06-30 DIAGNOSIS — N189 Chronic kidney disease, unspecified: Secondary | ICD-10-CM | POA: Diagnosis present

## 2021-06-30 DIAGNOSIS — Z20822 Contact with and (suspected) exposure to covid-19: Secondary | ICD-10-CM | POA: Diagnosis present

## 2021-06-30 DIAGNOSIS — E785 Hyperlipidemia, unspecified: Secondary | ICD-10-CM | POA: Diagnosis present

## 2021-06-30 DIAGNOSIS — E860 Dehydration: Secondary | ICD-10-CM | POA: Diagnosis present

## 2021-06-30 DIAGNOSIS — I10 Essential (primary) hypertension: Secondary | ICD-10-CM | POA: Diagnosis present

## 2021-06-30 DIAGNOSIS — Z833 Family history of diabetes mellitus: Secondary | ICD-10-CM

## 2021-06-30 DIAGNOSIS — N183 Chronic kidney disease, stage 3 unspecified: Secondary | ICD-10-CM | POA: Diagnosis present

## 2021-06-30 DIAGNOSIS — W19XXXA Unspecified fall, initial encounter: Secondary | ICD-10-CM

## 2021-06-30 DIAGNOSIS — N1831 Chronic kidney disease, stage 3a: Secondary | ICD-10-CM | POA: Diagnosis present

## 2021-06-30 DIAGNOSIS — E1140 Type 2 diabetes mellitus with diabetic neuropathy, unspecified: Secondary | ICD-10-CM | POA: Diagnosis present

## 2021-06-30 DIAGNOSIS — Z8673 Personal history of transient ischemic attack (TIA), and cerebral infarction without residual deficits: Secondary | ICD-10-CM

## 2021-06-30 DIAGNOSIS — I5022 Chronic systolic (congestive) heart failure: Secondary | ICD-10-CM | POA: Diagnosis present

## 2021-06-30 DIAGNOSIS — R569 Unspecified convulsions: Secondary | ICD-10-CM | POA: Diagnosis present

## 2021-06-30 DIAGNOSIS — R29700 NIHSS score 0: Secondary | ICD-10-CM | POA: Diagnosis present

## 2021-06-30 HISTORY — DX: Acute myocardial infarction, unspecified: I21.9

## 2021-06-30 HISTORY — DX: Type 2 diabetes mellitus without complications: E11.9

## 2021-06-30 HISTORY — DX: Personal history of urinary calculi: Z87.442

## 2021-06-30 HISTORY — DX: Anemia, unspecified: D64.9

## 2021-06-30 HISTORY — DX: Atherosclerotic heart disease of native coronary artery without angina pectoris: I25.10

## 2021-06-30 HISTORY — DX: Heart failure, unspecified: I50.9

## 2021-06-30 HISTORY — DX: Presence of automatic (implantable) cardiac defibrillator: Z95.810

## 2021-06-30 HISTORY — DX: Essential (primary) hypertension: I10

## 2021-06-30 HISTORY — DX: Disorder of kidney and ureter, unspecified: N28.9

## 2021-06-30 LAB — CBC WITH DIFFERENTIAL/PLATELET
Abs Immature Granulocytes: 0.03 10*3/uL (ref 0.00–0.07)
Basophils Absolute: 0 10*3/uL (ref 0.0–0.1)
Basophils Relative: 1 %
Eosinophils Absolute: 0.1 10*3/uL (ref 0.0–0.5)
Eosinophils Relative: 1 %
HCT: 39.9 % (ref 39.0–52.0)
Hemoglobin: 12.1 g/dL — ABNORMAL LOW (ref 13.0–17.0)
Immature Granulocytes: 0 %
Lymphocytes Relative: 41 %
Lymphs Abs: 3 10*3/uL (ref 0.7–4.0)
MCH: 24.6 pg — ABNORMAL LOW (ref 26.0–34.0)
MCHC: 30.3 g/dL (ref 30.0–36.0)
MCV: 81.1 fL (ref 80.0–100.0)
Monocytes Absolute: 0.4 10*3/uL (ref 0.1–1.0)
Monocytes Relative: 6 %
Neutro Abs: 3.8 10*3/uL (ref 1.7–7.7)
Neutrophils Relative %: 51 %
Platelets: 247 10*3/uL (ref 150–400)
RBC: 4.92 MIL/uL (ref 4.22–5.81)
RDW: 14 % (ref 11.5–15.5)
WBC: 7.3 10*3/uL (ref 4.0–10.5)
nRBC: 0 % (ref 0.0–0.2)

## 2021-06-30 LAB — CBG MONITORING, ED
Glucose-Capillary: 175 mg/dL — ABNORMAL HIGH (ref 70–99)
Glucose-Capillary: 189 mg/dL — ABNORMAL HIGH (ref 70–99)
Glucose-Capillary: 225 mg/dL — ABNORMAL HIGH (ref 70–99)
Glucose-Capillary: 282 mg/dL — ABNORMAL HIGH (ref 70–99)
Glucose-Capillary: 323 mg/dL — ABNORMAL HIGH (ref 70–99)
Glucose-Capillary: 494 mg/dL — ABNORMAL HIGH (ref 70–99)
Glucose-Capillary: 573 mg/dL (ref 70–99)
Glucose-Capillary: >600 mg/dL (ref 70–99)

## 2021-06-30 LAB — RAPID URINE DRUG SCREEN, HOSP PERFORMED
Amphetamines: NOT DETECTED
Barbiturates: NOT DETECTED
Benzodiazepines: NOT DETECTED
Cocaine: NOT DETECTED
Opiates: NOT DETECTED
Tetrahydrocannabinol: NOT DETECTED

## 2021-06-30 LAB — URINALYSIS, ROUTINE W REFLEX MICROSCOPIC
Bilirubin Urine: NEGATIVE
Glucose, UA: >=500 mg/dL — AB
Ketones, ur: NEGATIVE mg/dL
Leukocytes,Ua: NEGATIVE
Nitrite: NEGATIVE
Protein, ur: 100 mg/dL — AB
Specific Gravity, Urine: 1.021 (ref 1.005–1.030)
pH: 6 (ref 5.0–8.0)

## 2021-06-30 LAB — I-STAT VENOUS BLOOD GAS, ED
Acid-base deficit: 3 mmol/L — ABNORMAL HIGH (ref 0.0–2.0)
Bicarbonate: 21.5 mmol/L (ref 20.0–28.0)
Calcium, Ion: 1.08 mmol/L — ABNORMAL LOW (ref 1.15–1.40)
HCT: 38 % — ABNORMAL LOW (ref 39.0–52.0)
Hemoglobin: 12.9 g/dL — ABNORMAL LOW (ref 13.0–17.0)
O2 Saturation: 89 %
Potassium: 2.7 mmol/L — CL (ref 3.5–5.1)
Sodium: 133 mmol/L — ABNORMAL LOW (ref 135–145)
TCO2: 23 mmol/L (ref 22–32)
pCO2, Ven: 37.6 mmHg — ABNORMAL LOW (ref 44.0–60.0)
pH, Ven: 7.366 (ref 7.250–7.430)
pO2, Ven: 58 mmHg — ABNORMAL HIGH (ref 32.0–45.0)

## 2021-06-30 LAB — TROPONIN I (HIGH SENSITIVITY)
Troponin I (High Sensitivity): 51 ng/L — ABNORMAL HIGH (ref <18)
Troponin I (High Sensitivity): 54 ng/L — ABNORMAL HIGH (ref <18)

## 2021-06-30 LAB — HEMOGLOBIN A1C
Hgb A1c MFr Bld: 14.2 % — ABNORMAL HIGH (ref 4.8–5.6)
Mean Plasma Glucose: 360.84 mg/dL

## 2021-06-30 LAB — COMPREHENSIVE METABOLIC PANEL
ALT: 15 U/L (ref 0–44)
AST: 20 U/L (ref 15–41)
Albumin: 3.4 g/dL — ABNORMAL LOW (ref 3.5–5.0)
Alkaline Phosphatase: 85 U/L (ref 38–126)
Anion gap: 17 — ABNORMAL HIGH (ref 5–15)
BUN: 24 mg/dL — ABNORMAL HIGH (ref 6–20)
CO2: 21 mmol/L — ABNORMAL LOW (ref 22–32)
Calcium: 9 mg/dL (ref 8.9–10.3)
Chloride: 94 mmol/L — ABNORMAL LOW (ref 98–111)
Creatinine, Ser: 2.24 mg/dL — ABNORMAL HIGH (ref 0.61–1.24)
GFR, Estimated: 34 mL/min — ABNORMAL LOW (ref >60)
Glucose, Bld: 705 mg/dL (ref 70–99)
Potassium: 2.7 mmol/L — CL (ref 3.5–5.1)
Sodium: 132 mmol/L — ABNORMAL LOW (ref 135–145)
Total Bilirubin: 0.6 mg/dL (ref 0.3–1.2)
Total Protein: 7.2 g/dL (ref 6.5–8.1)

## 2021-06-30 LAB — I-STAT CHEM 8, ED
BUN: 25 mg/dL — ABNORMAL HIGH (ref 6–20)
Calcium, Ion: 1.1 mmol/L — ABNORMAL LOW (ref 1.15–1.40)
Chloride: 94 mmol/L — ABNORMAL LOW (ref 98–111)
Creatinine, Ser: 2 mg/dL — ABNORMAL HIGH (ref 0.61–1.24)
Glucose, Bld: >700 mg/dL (ref 70–99)
HCT: 39 % (ref 39.0–52.0)
Hemoglobin: 13.3 g/dL (ref 13.0–17.0)
Potassium: 2.7 mmol/L — CL (ref 3.5–5.1)
Sodium: 133 mmol/L — ABNORMAL LOW (ref 135–145)
TCO2: 21 mmol/L — ABNORMAL LOW (ref 22–32)

## 2021-06-30 LAB — LACTIC ACID, PLASMA: Lactic Acid, Venous: 2.3 mmol/L (ref 0.5–1.9)

## 2021-06-30 LAB — MAGNESIUM: Magnesium: 1.4 mg/dL — ABNORMAL LOW (ref 1.7–2.4)

## 2021-06-30 LAB — OSMOLALITY: Osmolality: 312 mOsm/kg — ABNORMAL HIGH (ref 275–295)

## 2021-06-30 LAB — BETA-HYDROXYBUTYRIC ACID: Beta-Hydroxybutyric Acid: 0.1 mmol/L (ref 0.05–0.27)

## 2021-06-30 LAB — BRAIN NATRIURETIC PEPTIDE: B Natriuretic Peptide: 30.6 pg/mL (ref 0.0–100.0)

## 2021-06-30 LAB — ETHANOL: Alcohol, Ethyl (B): <10 mg/dL (ref <10)

## 2021-06-30 LAB — SARS CORONAVIRUS 2 (TAT 6-24 HRS): SARS Coronavirus 2: NEGATIVE

## 2021-06-30 LAB — PHOSPHORUS: Phosphorus: 2.7 mg/dL (ref 2.5–4.6)

## 2021-06-30 LAB — CK: Total CK: 386 U/L (ref 49–397)

## 2021-06-30 MED ORDER — SODIUM CHLORIDE 0.9 % IV SOLN
INTRAVENOUS | Status: DC
Start: 2021-06-30 — End: 2021-07-01

## 2021-06-30 MED ORDER — HYDRALAZINE HCL 25 MG PO TABS
25.0000 mg | ORAL_TABLET | Freq: Three times a day (TID) | ORAL | Status: DC
  Administered 2021-07-01 (×3): 25 mg via ORAL
  Filled 2021-06-30 (×3): qty 1

## 2021-06-30 MED ORDER — MAGNESIUM SULFATE 2 GM/50ML IV SOLN
2.0000 g | Freq: Once | INTRAVENOUS | Status: AC
  Administered 2021-07-01: 2 g via INTRAVENOUS
  Filled 2021-06-30: qty 50

## 2021-06-30 MED ORDER — ACETAMINOPHEN 325 MG PO TABS: 650.0000 mg | ORAL_TABLET | Freq: Four times a day (QID) | ORAL | Status: DC | PRN

## 2021-06-30 MED ORDER — INSULIN ASPART 100 UNIT/ML IV SOLN
10.0000 [IU] | Freq: Once | INTRAVENOUS | Status: AC
  Administered 2021-06-30: 10 [IU] via INTRAVENOUS

## 2021-06-30 MED ORDER — HYDROCODONE-ACETAMINOPHEN 5-325 MG PO TABS: 1.0000 | ORAL_TABLET | ORAL | Status: DC | PRN

## 2021-06-30 MED ORDER — GABAPENTIN 400 MG PO CAPS
400.0000 mg | ORAL_CAPSULE | Freq: Three times a day (TID) | ORAL | Status: DC
  Administered 2021-07-01 – 2021-07-02 (×6): 400 mg via ORAL
  Filled 2021-06-30 (×6): qty 1

## 2021-06-30 MED ORDER — ROSUVASTATIN CALCIUM 20 MG PO TABS
40.0000 mg | ORAL_TABLET | Freq: Every day | ORAL | Status: DC
  Administered 2021-07-01 – 2021-07-02 (×2): 40 mg via ORAL
  Filled 2021-06-30 (×2): qty 2

## 2021-06-30 MED ORDER — POTASSIUM CHLORIDE 10 MEQ/100ML IV SOLN
10.0000 meq | INTRAVENOUS | Status: AC
  Administered 2021-06-30 (×4): 10 meq via INTRAVENOUS
  Filled 2021-06-30 (×4): qty 100

## 2021-06-30 MED ORDER — ACETAMINOPHEN 650 MG RE SUPP: 650.0000 mg | Freq: Four times a day (QID) | RECTAL | Status: DC | PRN

## 2021-06-30 MED ORDER — DEXTROSE IN LACTATED RINGERS 5 % IV SOLN: INTRAVENOUS | Status: DC

## 2021-06-30 MED ORDER — CARVEDILOL 12.5 MG PO TABS
12.5000 mg | ORAL_TABLET | Freq: Two times a day (BID) | ORAL | Status: DC
  Administered 2021-07-01 (×2): 12.5 mg via ORAL
  Filled 2021-06-30 (×2): qty 1

## 2021-06-30 MED ORDER — SODIUM CHLORIDE 0.9 % IV BOLUS: 1000.0000 mL | Freq: Once | INTRAVENOUS | Status: DC

## 2021-06-30 MED ORDER — LACTATED RINGERS IV SOLN
INTRAVENOUS | Status: DC
Start: 2021-06-30 — End: 2021-07-01

## 2021-06-30 MED ORDER — DEXTROSE 50 % IV SOLN
0.0000 mL | INTRAVENOUS | Status: DC | PRN
Start: 2021-06-30 — End: 2021-07-02

## 2021-06-30 MED ORDER — INSULIN REGULAR(HUMAN) IN NACL 100-0.9 UT/100ML-% IV SOLN
INTRAVENOUS | Status: DC
  Administered 2021-06-30: 13 [IU]/h via INTRAVENOUS
  Filled 2021-06-30: qty 100

## 2021-06-30 NOTE — ED Notes (Signed)
PA aware of pt's K 2.7 and glucose >700.

## 2021-06-30 NOTE — ED Notes (Signed)
PA, RT, tech and me at bedside with pt. No seizure activity, but pt not responding and unable to follow commands.

## 2021-06-30 NOTE — ED Provider Notes (Signed)
[facility] EMERGENCY DEPARTMENT Provider Note   CSN: [id number] Arrival date & time: 06/30/21  1613     History Chief Complaint  Patient presents with   Seizures   LEVEL 5 CAVEAT - POST ICTAL STATE  Jeffrey [name] is a 55 y.o. male who presents to the ED today for seizure like activity. Per nursing staff pt was sitting in a recliner in the waiting room as he is a visitor for another patient when he began having an active tonic clonic seizure. Once patient was brought back to a room he was no longer seizing and in a post ictal state. History significantly limited as patient unable to provide additional details at this time. Awaiting for pt to become more aware to be able to provide additional information/his name. Unknown if patient has hx of seizures at this time.   The history is provided by the patient and medical records (the nursing staff). The history is limited by the condition of the patient.      No past medical history on file.  There are no problems to display for this patient.    No family history on file.     Home Medications Prior to Admission medications   Not on File    Allergies    Patient has no known allergies.  Review of Systems   Review of Systems  Unable to perform ROS: Acuity of condition  Constitutional:  Negative for fever.  Neurological:  Positive for seizures.   Physical Exam Updated Vital Signs BP (!) 201/104 (BP Location: Left Arm)   Pulse 100   Temp 99 F (37.2 C) (Axillary)   Resp (!) 25   Ht '5\' 7"'$  (1.702 m)   Wt 95.3 kg   SpO2 95%   BMI 32.89 kg/m   Physical Exam Vitals and nursing note reviewed.  Constitutional:      Appearance: He is diaphoretic.  HENT:     Head: Normocephalic and atraumatic.     Mouth/Throat:     Mouth: Mucous membranes are dry.  Eyes:     Extraocular Movements: Extraocular movements intact.     Pupils: Pupils are equal, round, and reactive to light.  Cardiovascular:     Rate  and Rhythm: Normal rate and regular rhythm.     Pulses: Normal pulses.  Pulmonary:     Effort: Pulmonary effort is normal.     Breath sounds: Normal breath sounds. No wheezing, rhonchi or rales.  Abdominal:     Palpations: Abdomen is soft.     Tenderness: There is no abdominal tenderness. There is no guarding or rebound.  Musculoskeletal:     Cervical back: Neck supple.  Skin:    General: Skin is warm.  Neurological:     Mental Status: He is alert.     GCS: GCS eye subscore is 4. GCS verbal subscore is 4. GCS motor subscore is 5.     Comments: Responding to painful stimuli, continues to pull of NRB. Opening eyes spontaneously. Moaning with incomprehensible speech at this time. Unable to follow commands.     ED Results / Procedures / Treatments   Labs (all labs ordered are listed, but only abnormal results are displayed) Labs Reviewed  CBC WITH DIFFERENTIAL/PLATELET - Abnormal; Notable for the following components:      Result Value   Hemoglobin 12.1 (*)    MCH 24.6 (*)    All other components within normal limits  COMPREHENSIVE METABOLIC PANEL - Abnormal; Notable for  the following components:   Sodium 132 (*)    Potassium 2.7 (*)    Chloride 94 (*)    CO2 21 (*)    Glucose, Bld 705 (*)    BUN 24 (*)    Creatinine, Ser 2.24 (*)    Albumin 3.4 (*)    GFR, Estimated 34 (*)    Anion gap 17 (*)    All other components within normal limits  URINALYSIS, ROUTINE W REFLEX MICROSCOPIC - Abnormal; Notable for the following components:   Color, Urine STRAW (*)    Glucose, UA >=500 (*)    Hgb urine dipstick MODERATE (*)    Protein, ur 100 (*)    Bacteria, UA RARE (*)    All other components within normal limits  CBG MONITORING, ED - Abnormal; Notable for the following components:   Glucose-Capillary >600 (*)    All other components within normal limits  I-STAT CHEM 8, ED - Abnormal; Notable for the following components:   Sodium 133 (*)    Potassium 2.7 (*)    Chloride 94 (*)     BUN 25 (*)    Creatinine, Ser 2.00 (*)    Glucose, Bld >700 (*)    Calcium, Ion 1.10 (*)    TCO2 21 (*)    All other components within normal limits  I-STAT VENOUS BLOOD GAS, ED - Abnormal; Notable for the following components:   pCO2, Ven 37.6 (*)    pO2, Ven 58.0 (*)    Acid-base deficit 3.0 (*)    Sodium 133 (*)    Potassium 2.7 (*)    Calcium, Ion 1.08 (*)    HCT 38.0 (*)    Hemoglobin 12.9 (*)    All other components within normal limits  CBG MONITORING, ED - Abnormal; Notable for the following components:   Glucose-Capillary 573 (*)    All other components within normal limits  CBG MONITORING, ED - Abnormal; Notable for the following components:   Glucose-Capillary 494 (*)    All other components within normal limits  SARS CORONAVIRUS 2 (TAT 6-24 HRS)  ETHANOL  RAPID URINE DRUG SCREEN, HOSP PERFORMED  BETA-HYDROXYBUTYRIC ACID  OSMOLALITY  TROPONIN I (HIGH SENSITIVITY)    EKG None  Radiology CT HEAD WO CONTRAST  Result Date: 06/30/2021 CLINICAL DATA:  Mental status change. Unknown cause. visitor in a recliner and had an active tonic clonic seizure in the waiting room. Unclear per provider and patient if recent surgical history the orbit. EXAM: CT HEAD WITHOUT CONTRAST TECHNIQUE: Contiguous axial images were obtained from the base of the skull through the vertex without intravenous contrast. COMPARISON:  Head 06/12/2020 FINDINGS: Brain: Cerebral ventricle sizes are concordant with the degree of cerebral volume loss. Patchy and confluent areas of decreased attenuation are noted throughout the deep and periventricular white matter of the cerebral hemispheres bilaterally, compatible with chronic microvascular ischemic disease. No evidence of large-territorial acute infarction. No parenchymal hemorrhage. No mass lesion. No extra-axial collection. No mass effect or midline shift. No hydrocephalus. Basilar cisterns are patent. Vascular: No hyperdense vessel. Skull: No acute  fracture or focal lesion. Sinuses/Orbits: Paranasal sinuses and mastoid air cells are clear. Interval development of a hyperdense right globe. The left orbit is unremarkable. Other: None. IMPRESSION: 1. Interval development of a hyperdense right globe. Finding may be related to hematoma versus postsurgical changes. Recommend correlation with physical exam and clinical history. If no recent surgical procedure in the last year, recommend emergent ophthalmological consultation. 2.  Otherwise no  acute intracranial abnormality. These results were called by telephone at the time of interpretation on 06/30/2021 at 6:01 pm to provider Margerite Impastato , who verbally acknowledged these results. Electronically Signed   By: Iven Finn M.D.   On: 06/30/2021 18:04   DG Chest Port 1 View  Result Date: 06/30/2021 CLINICAL DATA:  CP, SOB, coughing Pacemaker placed but pt unsure of what year Hx of CH EXAM: PORTABLE CHEST 1 VIEW COMPARISON:  CT chest 12/03/2019, chest x-ray 06/12/2020 FINDINGS: The heart size and mediastinal contours are unchanged. Left chest wall single lead cardiac device in stable position. Persistent left base atelectasis with minimally elevated left hemidiaphragm. No focal consolidation. No pulmonary edema. No pleural effusion. No pneumothorax. No acute osseous abnormality. IMPRESSION: No active disease. Electronically Signed   By: Iven Finn M.D.   On: 06/30/2021 18:01    Procedures Procedures   Medications Ordered in ED Medications  insulin regular, human (MYXREDLIN) 100 units/ 100 mL infusion (13 Units/hr Intravenous New Bag/Given 06/30/21 1750)  lactated ringers infusion ( Intravenous New Bag/Given 06/30/21 1753)  dextrose 5 % in lactated ringers infusion (0 mLs Intravenous Hold 06/30/21 1742)  dextrose 50 % solution 0-50 mL (has no administration in time range)  potassium chloride 10 mEq in 100 mL IVPB (10 mEq Intravenous New Bag/Given 06/30/21 1755)  insulin aspart (novoLOG) injection 10  Units (10 Units Intravenous Given 06/30/21 1724)    ED Course  I have reviewed the triage vital signs and the nursing notes.  Pertinent labs & imaging results that were available during my care of the patient were reviewed by me and considered in my medical decision making (see chart for details).    MDM Rules/Calculators/A&P                           Jeffrey [name] is a male of unknown age who presents to the ED today for seizure-like activity.  He was in the waiting room as a visitor when he began having an active tonic-clonic seizure.  When he was brought back to the room he was in a postictal state.  History significantly limited as patient unable to provide additional information at this time.  His CBG is high, greater than 600.  Unknown if patient has history of diabetes.  Unknown if patient has history of seizures.  Remainder of vitals included temperature of 99.0, patient is somewhat diaphoretic at this time.  His pulse is 100.  He is slightly tachypneic with respirations at 25.  Blood pressure is elevated 201/104.  GCS of 12 at this time, maintaining his airway.  We will plan to work-up for seizure-like activity as well as concern for possible DKA.  Patient will likely require admission.   About 25 minutes into pt's ED visit he was able to provide his name and date of birth. He is slowly coming to at this time. He denies hx of seizures and per chart review it does not appear he has hx of same. He is unsure when his last insulin dose was. He is A&Ox3 currently; unable to remember what happened in the waiting room. Had initially ordered 1 L fluid bolus with 10 units IV insulin however pt has hx of CHF with EF 30%; will hold off on fluids at this time.   Istat chem 8 with glucose > 700 and potassium 2.7. creatinine 2.00 (appears stable from previous).  Istat vbg with normal pH at 7.366 and bicarb 21.5.  Will treat for HHS at this time. Started on endo tool with potassium supplementation  CXR  clear Received call from radiologist  regarding CT Head. Appears that R orbit has blood in it; question post surgical changes vs acute abnormality. 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   Pt currently on endotool. Receiving small amount of LR infusion for elevated creatinine 2.24 (baseline 1.90).   Discussed case with [facility] Dr. Roel Cluck who agrees to accept patient for admission.   This note was prepared using Dragon voice recognition software and may include unintentional dictation errors due to the inherent limitations of voice recognition software.   Final Clinical Impression(s) / ED Diagnoses Final diagnoses:  Seizure ([place])  Type 2 diabetes mellitus with hyperosmolar hyperglycemic state (HHS) ([place])  Hypokalemia    Rx / DC Orders ED Discharge Orders     None        Eustaquio Maize, PA-C 06/30/21 1848    Lucrezia Starch, MD 07/01/21 1550

## 2021-06-30 NOTE — ED Notes (Signed)
Pt is much more with it. Able to follow commands and is alert. Able to answer some questions, but not others. Able to answer orientation questions except for situation, but unable to answer medical hx.

## 2021-06-30 NOTE — H&P (Signed)
Jeffrey [name] [id number] DOB: Jan 15, 1966 DOA: 06/30/2021     PCP: Charlott Rakes, MD   Outpatient Specialists:   CARDS:  Dr.Nasher, Allred    Patient arrived to ER on 06/30/21 at 1613 Referred by Attending Toy Baker, MD   Patient coming from: home Lives alone,     With  SO    Chief Complaint:   Chief Complaint  Patient presents with   Seizures    HPI: Jeffrey [name] is a 55 y.o. male with medical history significant of   Nonischemic cardiomyopathy status post ICD, type 2 diabetes, HTN, HLD, systolic CHF, CKD stage IIIa  Presented with   seizure while in the waiting room of ER visiting family Unsure when was the last time he took his insulin Significant other reported that she has not been able him to get to take his medications. He takes his insulin sometimes but not often usually refuses Has  NOt been vaccinated against COVID    Initial COVID TEST  NEGATIVE   Lab Results  Component Value Date   [place] NEGATIVE 06/30/2021     Regarding pertinent Chronic problems:     Hyperlipidemia -  on statins Crestor Lipid Panel      HTN on Coreg, hydralazine, Imdur   chronic CHF diastolic/systolic/ combined - last echo April 2022 - is 30 to 35%. The   Echo 01/29/17:  EF 20-25, normal wall motion, mild LAE // 2. EF 10-15 by [facility] 01/2017 [employer] Single Chamber ICD implanted 01/2018 for NICM and syncope On lasix, Entresto, Spironolactone    CAD  - On A  statin, betablocker,                  -  followed by cardiology                - last cardiac cath   cath 01/31/17: 60% 1st RPLB, 60% dist RCA, 55% prox RCA, 10% pro LAD --> Rx TX.     DM 2 -  on insulin,  Lantus Most recent Hgb A1c was 10.7.        CKD stage IIIa- baseline Cr 1.5 Estimated Creatinine Clearance: 45.4 mL/min (A) (by C-G formula based on SCr of 2 mg/dL (H)).  Lab Results  Component Value Date   CREATININE 2.00 (H) 06/30/2021   CREATININE 2.24 (H) 06/30/2021     Chronic anemia  - baseline hg Hemoglobin & Hematocrit around 12 Recent Labs    06/30/21 1625 06/30/21 1655  HGB 12.1* 12.9*  13.3    While in ER: Initilly hypertensive, post ictal  VG >700 Diagnosed with HHS ans started on insulin drip     ED Triage Vitals  Enc Vitals Group     BP 06/30/21 1628 (!) 201/104     Pulse Rate 06/30/21 1628 100     Resp 06/30/21 1628 (!) 25     Temp 06/30/21 1628 99 F (37.2 C)     Temp Source 06/30/21 1628 Axillary     SpO2 06/30/21 1628 95 %     Weight 06/30/21 1736 210 lb (95.3 kg)     Height 06/30/21 1736 '5\' 7"'$  (1.702 m)     Head Circumference --      Peak Flow --      Pain Score --      Pain Loc --      Pain Edu? --      Excl. in [place]? --   [id number]     _________________________________________  Significant initial  Findings: Abnormal Labs Reviewed  CBC WITH DIFFERENTIAL/PLATELET - Abnormal; Notable for the following components:      Result Value   Hemoglobin 12.1 (*)    MCH 24.6 (*)    All other components within normal limits  COMPREHENSIVE METABOLIC PANEL - Abnormal; Notable for the following components:   Sodium 132 (*)    Potassium 2.7 (*)    Chloride 94 (*)    CO2 21 (*)    Glucose, Bld 705 (*)    BUN 24 (*)    Creatinine, Ser 2.24 (*)    Albumin 3.4 (*)    GFR, Estimated 34 (*)    Anion gap 17 (*)    All other components within normal limits  URINALYSIS, ROUTINE W REFLEX MICROSCOPIC - Abnormal; Notable for the following components:   Color, Urine STRAW (*)    Glucose, UA >=500 (*)    Hgb urine dipstick MODERATE (*)    Protein, ur 100 (*)    Bacteria, UA RARE (*)    All other components within normal limits  CBG MONITORING, ED - Abnormal; Notable for the following components:   Glucose-Capillary >600 (*)    All other components within normal limits  I-STAT CHEM 8, ED - Abnormal; Notable for the following components:   Sodium 133 (*)    Potassium 2.7 (*)    Chloride 94 (*)    BUN 25 (*)    Creatinine, Ser 2.00 (*)    Glucose,  Bld >700 (*)    Calcium, Ion 1.10 (*)    TCO2 21 (*)    All other components within normal limits  I-STAT VENOUS BLOOD GAS, ED - Abnormal; Notable for the following components:   pCO2, Ven 37.6 (*)    pO2, Ven 58.0 (*)    Acid-base deficit 3.0 (*)    Sodium 133 (*)    Potassium 2.7 (*)    Calcium, Ion 1.08 (*)    HCT 38.0 (*)    Hemoglobin 12.9 (*)    All other components within normal limits  CBG MONITORING, ED - Abnormal; Notable for the following components:   Glucose-Capillary 573 (*)    All other components within normal limits  CBG MONITORING, ED - Abnormal; Notable for the following components:   Glucose-Capillary 494 (*)    All other components within normal limits   ____________________________________________ Ordered CT HEAD  NON acute, Right eye post surgical changes  CXR -  NON acute    _________________________ Troponin 51 -54  ECG: Ordered Personally reviewed by me showing: HR : 100 Rhythm:  Sinus tachycardia    no evidence of ischemic changes QTC 505 ___________    The recent clinical data is shown below. Vitals:   06/30/21 1628 06/30/21 1736  BP: (!) 201/104   Pulse: 100   Resp: (!) 25   Temp: 99 F (37.2 C)   TempSrc: Axillary   SpO2: 95%   Weight:  95.3 kg  Height:  '5\' 7"'$  (1.702 m)      WBC     Component Value Date/Time   WBC 7.3 06/30/2021 1625   LYMPHSABS 3.0 06/30/2021 1625   MONOABS 0.4 06/30/2021 1625   EOSABS 0.1 06/30/2021 1625   BASOSABS 0.0 06/30/2021 1625     Lactic Acid, Venous    Component Value Date/Time   LATICACIDVEN 2.3 ([place]) 06/30/2021 2102       UA no evidence of UTI     Urine analysis:    Component  Value Date/Time   COLORURINE STRAW (A) 06/30/2021 1735   APPEARANCEUR CLEAR 06/30/2021 1735   LABSPEC 1.021 06/30/2021 1735   PHURINE 6.0 06/30/2021 1735   GLUCOSEU >=500 (A) 06/30/2021 1735   HGBUR MODERATE (A) 06/30/2021 1735   BILIRUBINUR NEGATIVE 06/30/2021 1735   KETONESUR NEGATIVE 06/30/2021 1735    PROTEINUR 100 (A) 06/30/2021 1735   NITRITE NEGATIVE 06/30/2021 1735   LEUKOCYTESUR NEGATIVE 06/30/2021 1735    No results found for this or any previous visit.   _______________________________________________ Hospitalist was called for admission for HHS  The following Work up has been ordered so far:  Orders Placed This Encounter  Procedures   SARS CORONAVIRUS 2 (TAT 6-24 HRS) Nasopharyngeal Nasopharyngeal Swab   CT HEAD WO CONTRAST   DG Chest Port 1 View   CBC WITH DIFFERENTIAL   Comprehensive metabolic panel   Ethanol/ETOH   Urinalysis, Routine w reflex microscopic   Urine rapid drug screen (hosp performed)   Beta-hydroxybutyric acid   Osmolality   Diet NPO time specified   Cardiac monitoring   Initiate Carrier Fluid Protocol   Interrogate Pacemaker if present   Cardiac monitoring   Initiate Carrier Fluid Protocol   Notify physician   If present, discontinue Insulin Pump after IV Insulin is initiated.   Do NOT use lab glucose values in [employer].  If CBG meter reads "Critical High", enter 600.   Upon IV fluid bolus completion, place order for STAT BMET (LAB15) and call provider with results.   Cardiac monitoring   Consult to hospitalist   Pulse oximetry, continuous   Pulse oximetry, continuous   CBG monitoring, ED   I-Stat Chem 8, ED   I-Stat venous blood gas, ([facility] ED)   CBG monitoring, ED   CBG monitoring, ED   CBG monitoring, ED   EKG 12-Lead   Insert peripheral IV   Insert peripheral IV   Place in observation (patient's expected length of stay will be less than 2 midnights)   Seizure precautions     Following Medications were ordered in ER: Medications  insulin regular, human (MYXREDLIN) 100 units/ 100 mL infusion (10 Units/hr Intravenous Rate/Dose Change 06/30/21 1850)  lactated ringers infusion ( Intravenous New Bag/Given 06/30/21 1753)  dextrose 5 % in lactated ringers infusion (0 mLs Intravenous Hold 06/30/21 1742)  dextrose 50 % solution 0-50 mL (has no  administration in time range)  potassium chloride 10 mEq in 100 mL IVPB (10 mEq Intravenous New Bag/Given 06/30/21 1852)  insulin aspart (novoLOG) injection 10 Units (10 Units Intravenous Given 06/30/21 1724)        Consult Orders  (From admission, onward)           Start     Ordered   06/30/21 1810  Consult to hospitalist  Once       Provider:  (Not yet assigned)  Question Answer Comment  Place call to: [facility]   Reason for Consult Admit      06/30/21 1813             OTHER Significant initial  Findings:  labs showing:    Recent Labs  Lab 06/30/21 1625 06/30/21 1655 06/30/21 2044  NA 132* 133*  133*  --   K 2.7* 2.7*  2.7*  --   CO2 21*  --   --   GLUCOSE 705* >700*  --   BUN 24* 25*  --   CREATININE 2.24* 2.00*  --   CALCIUM 9.0  --   --  MG  --   --  1.4*  PHOS  --   --  2.7    Cr  stable,    Lab Results  Component Value Date   CREATININE 2.00 (H) 06/30/2021   CREATININE 2.24 (H) 06/30/2021    Recent Labs  Lab 06/30/21 1625  AST 20  ALT 15  ALKPHOS 85  BILITOT 0.6  PROT 7.2  ALBUMIN 3.4*   Lab Results  Component Value Date   CALCIUM 9.0 06/30/2021       Plt: Lab Results  Component Value Date   PLT 247 06/30/2021     Venous  Blood Gas result:  pH 7.366  pCO2 37.6;     ABG    Component Value Date/Time   HCO3 21.5 06/30/2021 1655   TCO2 21 (L) 06/30/2021 1655   TCO2 23 06/30/2021 1655   ACIDBASEDEF 3.0 (H) 06/30/2021 1655   O2SAT 89.0 06/30/2021 1655       Recent Labs  Lab 06/30/21 1625 06/30/21 1655  WBC 7.3  --   NEUTROABS 3.8  --   HGB 12.1* 12.9*  13.3  HCT 39.9 38.0*  39.0  MCV 81.1  --   PLT 247  --     HG/HCT  stable,      Component Value Date/Time   HGB 13.3 06/30/2021 1655   HGB 12.9 (L) 06/30/2021 1655   HCT 39.0 06/30/2021 1655   HCT 38.0 (L) 06/30/2021 1655   MCV 81.1 06/30/2021 1625      Cardiac Panel (last 3 results) Recent Labs    06/30/21 2044  CKTOTAL 386     BNP (last 3  results) Recent Labs    06/30/21 1935  BNP 30.6      DM  labs:  HbA1C: Recent Labs    06/30/21 2007  HGBA1C 14.2*       CBG (last 3)  Recent Labs    06/30/21 2150 06/30/21 2254 06/30/21 2353  GLUCAP 282* 225* 175*      Cultures: No results found for: SDES, SPECREQUEST, CULT, REPTSTATUS   Radiological Exams on Admission: CT HEAD WO CONTRAST  Result Date: 06/30/2021 CLINICAL DATA:  Mental status change. Unknown cause. visitor in a recliner and had an active tonic clonic seizure in the waiting room. Unclear per provider and patient if recent surgical history the orbit. EXAM: CT HEAD WITHOUT CONTRAST TECHNIQUE: Contiguous axial images were obtained from the base of the skull through the vertex without intravenous contrast. COMPARISON:  Head 06/12/2020 FINDINGS: Brain: Cerebral ventricle sizes are concordant with the degree of cerebral volume loss. Patchy and confluent areas of decreased attenuation are noted throughout the deep and periventricular white matter of the cerebral hemispheres bilaterally, compatible with chronic microvascular ischemic disease. No evidence of large-territorial acute infarction. No parenchymal hemorrhage. No mass lesion. No extra-axial collection. No mass effect or midline shift. No hydrocephalus. Basilar cisterns are patent. Vascular: No hyperdense vessel. Skull: No acute fracture or focal lesion. Sinuses/Orbits: Paranasal sinuses and mastoid air cells are clear. Interval development of a hyperdense right globe. The left orbit is unremarkable. Other: None. IMPRESSION: 1. Interval development of a hyperdense right globe. Finding may be related to hematoma versus postsurgical changes. Recommend correlation with physical exam and clinical history. If no recent surgical procedure in the last year, recommend emergent ophthalmological consultation. 2.  Otherwise no acute intracranial abnormality. These results were called by telephone at the time of interpretation on  06/30/2021 at 6:01 pm to provider MARGAUX VENTER ,  who verbally acknowledged these results. Electronically Signed   By: Iven Finn M.D.   On: 06/30/2021 18:04   DG Chest Port 1 View  Result Date: 06/30/2021 CLINICAL DATA:  CP, SOB, coughing Pacemaker placed but pt unsure of what year Hx of CH EXAM: PORTABLE CHEST 1 VIEW COMPARISON:  CT chest 12/03/2019, chest x-ray 06/12/2020 FINDINGS: The heart size and mediastinal contours are unchanged. Left chest wall single lead cardiac device in stable position. Persistent left base atelectasis with minimally elevated left hemidiaphragm. No focal consolidation. No pulmonary edema. No pleural effusion. No pneumothorax. No acute osseous abnormality. IMPRESSION: No active disease. Electronically Signed   By: Iven Finn M.D.   On: 06/30/2021 18:01   _______________________________________________________________________________________________________ Latest  Blood pressure (!) 201/104, pulse 100, temperature 99 F (37.2 C), temperature source Axillary, resp. rate (!) 25, height '5\' 7"'$  (1.702 m), weight 95.3 kg, SpO2 95 %.   Review of Systems:    Pertinent positives include: seizure  Constitutional:  No weight loss, night sweats, Fevers, chills, fatigue, weight loss  HEENT:  No headaches, Difficulty swallowing,Tooth/dental problems,Sore throat,  No sneezing, itching, ear ache, nasal congestion, post nasal drip,  Cardio-vascular:  No chest pain, Orthopnea, PND, anasarca, dizziness, palpitations.no Bilateral lower extremity swelling  GI:  No heartburn, indigestion, abdominal pain, nausea, vomiting, diarrhea, change in bowel habits, loss of appetite, melena, blood in stool, hematemesis Resp:  no shortness of breath at rest. No dyspnea on exertion, No excess mucus, no productive cough, No non-productive cough, No coughing up of blood.No change in color of mucus.No wheezing. Skin:  no rash or lesions. No jaundice GU:  no dysuria, change in color of  urine, no urgency or frequency. No straining to urinate.  No flank pain.  Musculoskeletal:  No joint pain or no joint swelling. No decreased range of motion. No back pain.  Psych:  No change in mood or affect. No depression or anxiety. No memory loss.  Neuro: no localizing neurological complaints, no tingling, no weakness, no double vision, no gait abnormality, no slurred speech, no confusion  All systems reviewed and apart from [place] all are negative _______________________________________________________________________________________________ Past Medical History:   Past Medical History:  Diagnosis Date   CHF (congestive heart failure) ([place])    Diabetes mellitus without complication ([place])    Hypertension    Renal disorder       Past Surgical History:  Procedure Laterality Date   CARDIAC CATHETERIZATION     INSERT / REPLACE / REMOVE PACEMAKER      Social History:  Ambulatory  independently       reports that he has never smoked. He has never used smokeless tobacco. He reports that he does not drink alcohol and does not use drugs.     Family History:   Family History  Problem Relation Age of Onset   Diabetes Mother    ______________________________________________________________________________________________ Allergies: No Known Allergies   Prior to Admission medications   Not on File    ___________________________________________________________________________________________________ Physical Exam: Vitals with BMI 06/30/2021  Height '5\' 7"'$   Weight 210 lbs  BMI [id number]  Systolic 123456  Diastolic 123456  Pulse [id number]     1. General:  in No  Acute distress   Chronically ill  -appearing 2. Psychological: Alert and  Oriented 3. Head/ENT:    Dry Mucous Membranes                          Head Non traumatic,  neck supple                           Poor Dentition 4. SKIN:  decreased Skin turgor,  Skin clean Dry and intact no rash 5. Heart: Regular rate and rhythm no  Murmur, no Rub or gallop 6. Lungs:  no wheezes or crackles   7. Abdomen: Soft,  non-tender, Non distended   obese  bowel sounds present 8. Lower extremities: no clubbing, cyanosis, no  edema 9. Neurologically Grossly intact, moving all 4 extremities equally   10. MSK: Normal range of motion    Chart has been reviewed  ______________________________________________________________________________________________  Assessment/Plan 55 y.o. male with medical history significant of   Nonischemic cardiomyopathy status post ICD, type 2 diabetes, HTN, HLD, systolic CHF, CKD stage IIIa   Admitted for HHS/seizure  Present on Admission:  Hyperglycemia  Hyperosmolar hyperglycemic state (HHS) (HCC)will admit per HHS protocol, obtain serial BMET, start on glucosestabalizer, aggressive IVF.    So far work up of possible causes of  HSS with CXR, ECG one set of cardiac enzymes, UA.  Showed steadily elevated troponin no evidence of infection  Most likely cause been noncompliance Monitor in Stepdown. Replace potassium as needed.   Consult diabetes coordinator Transition off of insulin drip as blood sugar remains below 200 subsequently for  3 times No evidence of DKA beta hydroxybutyric acid within normal limits acidosis most likely secondary to lactic acidosis in the setting of seizure   Prolonged QT interval - - will monitor on tele avoid QT prolonging medications, rehydrate correct electrolytes  Seizure -in the setting of hyperglycemia continue seizure precautions currently still slightly postictal but improving   Acute on chronic renal failure (HCC) secondary to dehydration will rehydrate and follow fluid status   Hypokalemia, hypomagnesemia-replace Replace magnesium    Chronic systolic CHF (congestive heart failure) (HCC) -appears to be on a dry side hold Lasix for tonight it is unclear if patient still takes Entresto but will hold given worsening AKI   Essential hypertension -resume some of  her home medications as able to tolerate   Uncontrolled type 2 DM with hyperosmolar nonketotic hyperglycemia ([place]) once able to transition off of insulin drip and resume Lantus at two thirds of her home dose Need diabetes coordinator consult   Anemia due to chronic kidney disease -chronic stable   CKD (chronic kidney disease), stage III (HCC)-  -chronic avoid nephrotoxic medications such as NSAIDs, Vanco Zosyn combo,  avoid hypotension, continue to follow renal function   CAD (coronary artery disease) -  - chronic, continue statin and beta blocker*   Elevated troponin  -  -no chest pain no EKG changes in the setting of an systolic CHF chronic kidney disease likely due to demand ischemia and poor clearance, monitor on telemetry and cycle cardiac enzymes to trend.  if continues to rise will need further work-up   Other plan as per orders.  DVT prophylaxis:  SCD     Code Status:    Code Status: Not on file FULL CODE   as per patient   I had personally discussed CODE STATUS with patient    Family Communication:   Family not at  Bedside    Disposition Plan:     To home once workup is complete and patient is stable   Following barriers for discharge:  Electrolytes corrected                               Anemia stable                                                            Will need to be able to tolerate PO                            Will likely need home health,                             Will need consultants to evaluate patient prior to discharge                      Would benefit from PT/OT eval prior to DC  Ordered                   Swallow eval - SLP ordered                   Diabetes care coordinator                                        Consults called:  email cardiology to let tthm know pt has been admitted  Admission status:  ED Disposition     ED Disposition  [place]: [place] [[id number]]  Level of Care: Progressive [102]  Admit to Progressive based on following criteria: GI, ENDOCRINE disease patients with GI bleeding, acute liver failure or pancreatitis, stable with diabetic ketoacidosis or thyrotoxicosis (hypothyroid) state.  May place patient in observation at [facility] or [place] if equivalent level of care is available:: No  Covid Evaluation: Asymptomatic Screening Protocol (No Symptoms)  Diagnosis: Hyperglycemia [id number]  Admitting Physician: Toy Baker [[id number]]  Attending Physician: Toy Baker [[id number]]           Obs      Level of care  progressive  tele indefinitely please discontinue once patient no longer qualifies COVID-19 Labs    Lab Results  Component Value Date   [place] 06/30/2021     Precautions: admitted as Covid Negative      PPE: Used by the provider:   N95  eye Goggles,  Gloves      Aracelys Glade 07/01/2021, 12:45 AM     [facility]     after 2 AM please page floor coverage PA If 7AM-7PM, please contact the day team taking care of the patient using [email or web address]   Patient was evaluated in the context of the global COVID-19 pandemic, which necessitated consideration that the patient might be at risk for infection with the SARS-CoV-2 virus that causes COVID-19. Institutional protocols and algorithms that pertain to the evaluation of patients at risk for COVID-19 are in a state of rapid change based on information released by regulatory bodies including the CDC and federal and state organizations. These policies and algorithms were followed during the patient's  care.

## 2021-06-30 NOTE — ED Notes (Signed)
Pt oriented x4, but still unsure of medical hx. According to sister, but doesn't really take care of himself, he has someone who gives him his meds and he only takes them sometimes.

## 2021-06-30 NOTE — ED Triage Notes (Signed)
Pt here as a visitor in a recliner and had an active tonic clonic seizure in the waiting room.

## 2021-06-30 NOTE — ED Notes (Signed)
Pt asleep. NAD noted.

## 2021-07-01 ENCOUNTER — Inpatient Hospital Stay (HOSPITAL_COMMUNITY): Payer: Medicare Other

## 2021-07-01 ENCOUNTER — Observation Stay (HOSPITAL_COMMUNITY): Payer: Medicare Other

## 2021-07-01 ENCOUNTER — Encounter (HOSPITAL_COMMUNITY): Payer: Self-pay | Admitting: Internal Medicine

## 2021-07-01 DIAGNOSIS — N1831 Chronic kidney disease, stage 3a: Secondary | ICD-10-CM | POA: Diagnosis present

## 2021-07-01 DIAGNOSIS — I428 Other cardiomyopathies: Secondary | ICD-10-CM | POA: Diagnosis present

## 2021-07-01 DIAGNOSIS — E1140 Type 2 diabetes mellitus with diabetic neuropathy, unspecified: Secondary | ICD-10-CM | POA: Diagnosis present

## 2021-07-01 DIAGNOSIS — N179 Acute kidney failure, unspecified: Secondary | ICD-10-CM | POA: Diagnosis present

## 2021-07-01 DIAGNOSIS — I5042 Chronic combined systolic (congestive) and diastolic (congestive) heart failure: Secondary | ICD-10-CM | POA: Diagnosis present

## 2021-07-01 DIAGNOSIS — Z28310 Unvaccinated for covid-19: Secondary | ICD-10-CM | POA: Diagnosis not present

## 2021-07-01 DIAGNOSIS — R778 Other specified abnormalities of plasma proteins: Secondary | ICD-10-CM | POA: Diagnosis present

## 2021-07-01 DIAGNOSIS — I639 Cerebral infarction, unspecified: Secondary | ICD-10-CM | POA: Diagnosis not present

## 2021-07-01 DIAGNOSIS — Z6832 Body mass index (BMI) 32.0-32.9, adult: Secondary | ICD-10-CM | POA: Diagnosis not present

## 2021-07-01 DIAGNOSIS — E785 Hyperlipidemia, unspecified: Secondary | ICD-10-CM | POA: Diagnosis present

## 2021-07-01 DIAGNOSIS — I2510 Atherosclerotic heart disease of native coronary artery without angina pectoris: Secondary | ICD-10-CM | POA: Diagnosis present

## 2021-07-01 DIAGNOSIS — Z20822 Contact with and (suspected) exposure to covid-19: Secondary | ICD-10-CM | POA: Diagnosis present

## 2021-07-01 DIAGNOSIS — I130 Hypertensive heart and chronic kidney disease with heart failure and stage 1 through stage 4 chronic kidney disease, or unspecified chronic kidney disease: Secondary | ICD-10-CM | POA: Diagnosis present

## 2021-07-01 DIAGNOSIS — Z95810 Presence of automatic (implantable) cardiac defibrillator: Secondary | ICD-10-CM | POA: Diagnosis not present

## 2021-07-01 DIAGNOSIS — E876 Hypokalemia: Secondary | ICD-10-CM | POA: Diagnosis present

## 2021-07-01 DIAGNOSIS — E1100 Type 2 diabetes mellitus with hyperosmolarity without nonketotic hyperglycemic-hyperosmolar coma (NKHHC): Secondary | ICD-10-CM | POA: Diagnosis present

## 2021-07-01 DIAGNOSIS — E860 Dehydration: Secondary | ICD-10-CM | POA: Diagnosis present

## 2021-07-01 DIAGNOSIS — I248 Other forms of acute ischemic heart disease: Secondary | ICD-10-CM | POA: Diagnosis present

## 2021-07-01 DIAGNOSIS — Z9114 Patient's other noncompliance with medication regimen: Secondary | ICD-10-CM | POA: Diagnosis not present

## 2021-07-01 DIAGNOSIS — I6389 Other cerebral infarction: Secondary | ICD-10-CM | POA: Diagnosis not present

## 2021-07-01 DIAGNOSIS — I5022 Chronic systolic (congestive) heart failure: Secondary | ICD-10-CM | POA: Diagnosis not present

## 2021-07-01 DIAGNOSIS — E872 Acidosis: Secondary | ICD-10-CM | POA: Diagnosis present

## 2021-07-01 DIAGNOSIS — I10 Essential (primary) hypertension: Secondary | ICD-10-CM | POA: Diagnosis not present

## 2021-07-01 DIAGNOSIS — E669 Obesity, unspecified: Secondary | ICD-10-CM | POA: Diagnosis present

## 2021-07-01 DIAGNOSIS — E1122 Type 2 diabetes mellitus with diabetic chronic kidney disease: Secondary | ICD-10-CM | POA: Diagnosis present

## 2021-07-01 DIAGNOSIS — R569 Unspecified convulsions: Secondary | ICD-10-CM

## 2021-07-01 DIAGNOSIS — D631 Anemia in chronic kidney disease: Secondary | ICD-10-CM | POA: Diagnosis present

## 2021-07-01 DIAGNOSIS — R29700 NIHSS score 0: Secondary | ICD-10-CM | POA: Diagnosis present

## 2021-07-01 LAB — BASIC METABOLIC PANEL
Anion gap: 8 (ref 5–15)
Anion gap: 6 (ref 5–15)
BUN: 21 mg/dL — ABNORMAL HIGH (ref 6–20)
BUN: 20 mg/dL (ref 6–20)
CO2: 25 mmol/L (ref 22–32)
CO2: 27 mmol/L (ref 22–32)
Calcium: 9.1 mg/dL (ref 8.9–10.3)
Calcium: 8.7 mg/dL — ABNORMAL LOW (ref 8.9–10.3)
Chloride: 103 mmol/L (ref 98–111)
Chloride: 104 mmol/L (ref 98–111)
Creatinine, Ser: 1.64 mg/dL — ABNORMAL HIGH (ref 0.61–1.24)
Creatinine, Ser: 1.51 mg/dL — ABNORMAL HIGH (ref 0.61–1.24)
GFR, Estimated: 49 mL/min — ABNORMAL LOW (ref >60)
GFR, Estimated: 54 mL/min — ABNORMAL LOW (ref >60)
Glucose, Bld: 161 mg/dL — ABNORMAL HIGH (ref 70–99)
Glucose, Bld: 86 mg/dL (ref 70–99)
Potassium: 2.5 mmol/L — CL (ref 3.5–5.1)
Potassium: 2.6 mmol/L — CL (ref 3.5–5.1)
Sodium: 136 mmol/L (ref 135–145)
Sodium: 137 mmol/L (ref 135–145)

## 2021-07-01 LAB — GLUCOSE, CAPILLARY
Glucose-Capillary: 193 mg/dL — ABNORMAL HIGH (ref 70–99)
Glucose-Capillary: 192 mg/dL — ABNORMAL HIGH (ref 70–99)

## 2021-07-01 LAB — CBC WITH DIFFERENTIAL/PLATELET
Abs Immature Granulocytes: 0.02 10*3/uL (ref 0.00–0.07)
Basophils Absolute: 0 10*3/uL (ref 0.0–0.1)
Basophils Relative: 1 %
Eosinophils Absolute: 0.1 10*3/uL (ref 0.0–0.5)
Eosinophils Relative: 1 %
HCT: 36 % — ABNORMAL LOW (ref 39.0–52.0)
Hemoglobin: 11.3 g/dL — ABNORMAL LOW (ref 13.0–17.0)
Immature Granulocytes: 0 %
Lymphocytes Relative: 27 %
Lymphs Abs: 1.5 10*3/uL (ref 0.7–4.0)
MCH: 25.1 pg — ABNORMAL LOW (ref 26.0–34.0)
MCHC: 31.4 g/dL (ref 30.0–36.0)
MCV: 79.8 fL — ABNORMAL LOW (ref 80.0–100.0)
Monocytes Absolute: 0.4 10*3/uL (ref 0.1–1.0)
Monocytes Relative: 8 %
Neutro Abs: 3.5 10*3/uL (ref 1.7–7.7)
Neutrophils Relative %: 63 %
Platelets: 207 10*3/uL (ref 150–400)
RBC: 4.51 MIL/uL (ref 4.22–5.81)
RDW: 14.2 % (ref 11.5–15.5)
WBC: 5.6 10*3/uL (ref 4.0–10.5)
nRBC: 0 % (ref 0.0–0.2)

## 2021-07-01 LAB — COMPREHENSIVE METABOLIC PANEL
ALT: 13 U/L (ref 0–44)
AST: 16 U/L (ref 15–41)
Albumin: 2.8 g/dL — ABNORMAL LOW (ref 3.5–5.0)
Alkaline Phosphatase: 65 U/L (ref 38–126)
Anion gap: 13 (ref 5–15)
BUN: 22 mg/dL — ABNORMAL HIGH (ref 6–20)
CO2: 24 mmol/L (ref 22–32)
Calcium: 9.1 mg/dL (ref 8.9–10.3)
Chloride: 102 mmol/L (ref 98–111)
Creatinine, Ser: 1.58 mg/dL — ABNORMAL HIGH (ref 0.61–1.24)
GFR, Estimated: 51 mL/min — ABNORMAL LOW (ref >60)
Glucose, Bld: 110 mg/dL — ABNORMAL HIGH (ref 70–99)
Potassium: 2.5 mmol/L — CL (ref 3.5–5.1)
Sodium: 139 mmol/L (ref 135–145)
Total Bilirubin: 0.7 mg/dL (ref 0.3–1.2)
Total Protein: 6.1 g/dL — ABNORMAL LOW (ref 6.5–8.1)

## 2021-07-01 LAB — ECHOCARDIOGRAM COMPLETE
Area-P 1/2: 4.89 cm2
Calc EF: 37.2 %
Height: 67 in
S' Lateral: 3.9 cm
Single Plane A2C EF: 35.8 %
Single Plane A4C EF: 38.6 %
Weight: 3360 oz

## 2021-07-01 LAB — TROPONIN I (HIGH SENSITIVITY)
Troponin I (High Sensitivity): 59 ng/L — ABNORMAL HIGH (ref <18)
Troponin I (High Sensitivity): 59 ng/L — ABNORMAL HIGH (ref <18)

## 2021-07-01 LAB — CBG MONITORING, ED
Glucose-Capillary: 145 mg/dL — ABNORMAL HIGH (ref 70–99)
Glucose-Capillary: 164 mg/dL — ABNORMAL HIGH (ref 70–99)
Glucose-Capillary: 184 mg/dL — ABNORMAL HIGH (ref 70–99)
Glucose-Capillary: 204 mg/dL — ABNORMAL HIGH (ref 70–99)
Glucose-Capillary: 230 mg/dL — ABNORMAL HIGH (ref 70–99)
Glucose-Capillary: 89 mg/dL (ref 70–99)
Glucose-Capillary: 94 mg/dL (ref 70–99)

## 2021-07-01 LAB — LACTIC ACID, PLASMA
Lactic Acid, Venous: 1.8 mmol/L (ref 0.5–1.9)
Lactic Acid, Venous: 1.4 mmol/L (ref 0.5–1.9)
Lactic Acid, Venous: 1.4 mmol/L (ref 0.5–1.9)

## 2021-07-01 LAB — PHOSPHORUS: Phosphorus: 3 mg/dL (ref 2.5–4.6)

## 2021-07-01 LAB — SODIUM, URINE, RANDOM: Sodium, Ur: 59 mmol/L

## 2021-07-01 LAB — CREATININE, URINE, RANDOM: Creatinine, Urine: 155.62 mg/dL

## 2021-07-01 LAB — POTASSIUM: Potassium: 2.9 mmol/L — ABNORMAL LOW (ref 3.5–5.1)

## 2021-07-01 LAB — HIV ANTIBODY (ROUTINE TESTING W REFLEX): HIV Screen 4th Generation wRfx: NONREACTIVE

## 2021-07-01 LAB — TSH: TSH: 0.709 u[IU]/mL (ref 0.350–4.500)

## 2021-07-01 LAB — MAGNESIUM: Magnesium: 1.7 mg/dL (ref 1.7–2.4)

## 2021-07-01 MED ORDER — CLOPIDOGREL BISULFATE 75 MG PO TABS
75.0000 mg | ORAL_TABLET | Freq: Every day | ORAL | Status: DC
  Administered 2021-07-02: 75 mg via ORAL
  Filled 2021-07-01: qty 1

## 2021-07-01 MED ORDER — SODIUM CHLORIDE 0.9 % IV SOLN: INTRAVENOUS | Status: DC

## 2021-07-01 MED ORDER — INSULIN GLARGINE 100 UNIT/ML ~~LOC~~ SOLN
40.0000 [IU] | Freq: Every day | SUBCUTANEOUS | Status: DC
  Administered 2021-07-01 (×2): 40 [IU] via SUBCUTANEOUS
  Filled 2021-07-01 (×3): qty 0.4

## 2021-07-01 MED ORDER — INSULIN ASPART 100 UNIT/ML IJ SOLN
0.0000 [IU] | INTRAMUSCULAR | Status: DC
  Administered 2021-07-01 (×2): 2 [IU] via SUBCUTANEOUS
  Administered 2021-07-01: 1 [IU] via SUBCUTANEOUS
  Administered 2021-07-01: 2 [IU] via SUBCUTANEOUS
  Administered 2021-07-02: 3 [IU] via SUBCUTANEOUS
  Administered 2021-07-02 (×2): 2 [IU] via SUBCUTANEOUS

## 2021-07-01 MED ORDER — POTASSIUM CHLORIDE CRYS ER 20 MEQ PO TBCR
40.0000 meq | EXTENDED_RELEASE_TABLET | ORAL | Status: AC
Start: 2021-07-01 — End: 2021-07-01
  Administered 2021-07-01 (×2): 40 meq via ORAL
  Filled 2021-07-01 (×2): qty 2

## 2021-07-01 MED ORDER — HYDRALAZINE HCL 50 MG PO TABS
50.0000 mg | ORAL_TABLET | Freq: Three times a day (TID) | ORAL | Status: DC
  Administered 2021-07-01 – 2021-07-02 (×3): 50 mg via ORAL
  Filled 2021-07-01 (×3): qty 1

## 2021-07-01 MED ORDER — ISOSORBIDE MONONITRATE ER 30 MG PO TB24
30.0000 mg | ORAL_TABLET | Freq: Every day | ORAL | Status: DC
  Administered 2021-07-01 – 2021-07-02 (×2): 30 mg via ORAL
  Filled 2021-07-01 (×2): qty 1

## 2021-07-01 MED ORDER — ASPIRIN EC 81 MG PO TBEC
81.0000 mg | DELAYED_RELEASE_TABLET | Freq: Every day | ORAL | Status: DC
  Administered 2021-07-02: 81 mg via ORAL
  Filled 2021-07-01: qty 1

## 2021-07-01 MED ORDER — CARVEDILOL 6.25 MG PO TABS
18.7500 mg | ORAL_TABLET | Freq: Two times a day (BID) | ORAL | Status: DC
  Administered 2021-07-01 – 2021-07-02 (×3): 18.75 mg via ORAL
  Filled 2021-07-01 (×3): qty 1

## 2021-07-01 MED ORDER — POTASSIUM CHLORIDE 10 MEQ/100ML IV SOLN
10.0000 meq | INTRAVENOUS | Status: AC
Start: 2021-07-01 — End: 2021-07-01
  Administered 2021-07-01 (×4): 10 meq via INTRAVENOUS
  Filled 2021-07-01 (×4): qty 100

## 2021-07-01 NOTE — Progress Notes (Signed)
Informed of MRI for today.   Device system confirmed to be MRI conditional, with implant date > 6 weeks ago, and no evidence of abandoned or epicardial leads in review of most recent CXR Interrogation from today reviewed, pt is currently VS with 0% V pacing.  Change device settings for MRI to  OVO  Tachy-therapies to off if applicable.  Program device back to pre-MRI settings after completion of exam.  Shirley Friar, PA-C  07/01/2021 2:00 PM

## 2021-07-01 NOTE — Procedures (Signed)
Patient Name: Jeffrey [name]  MRN: [id number]  Epilepsy Attending: Lora Havens  Referring Physician/Provider: Dr Cordelia Poche Date: 07/01/2021 Duration: 24.18 mins  Patient history: 55 year old male with a seizure in the setting of hyperglycemia.  EEG did not show seizures.  Level of alertness: Awake, asleep  AEDs during EEG study: Gabapentin  Technical aspects: This EEG study was done with scalp electrodes positioned according to the 10-20 International system of electrode placement. Electrical activity was acquired at a sampling rate of '500Hz'$  and reviewed with a high frequency filter of '70Hz'$  and a low frequency filter of '1Hz'$ . EEG data were recorded continuously and digitally stored.   Description: The posterior dominant rhythm consists of 9-10 Hz activity of moderate voltage (25-35 uV) seen predominantly in posterior head regions, symmetric and reactive to eye opening and eye closing. Sleep was characterized by vertex waves, sleep spindles (12 to 14 Hz), maximal frontocentral region. Hyperventilation and photic stimulation were not performed.     IMPRESSION: This study is within normal limits. No seizures or epileptiform discharges were seen throughout the recording.  [name]

## 2021-07-01 NOTE — Progress Notes (Signed)
Occupational Therapy Evaluation  PTA pt independent with ADL and IADL tasks. Pt appears slow to respond to some questions, however wife reports his "thinking is better".  Pt apparently noncompliant with medical management of diabetes. Wife states he is suppose to check his "sugar level" 3x/day, however has not checked it in 4-5 months. Pt only gives himself insulin "when he feels really bad". Pt with numbness B feet, R worse than L. Apparent Charcot foot with hammer toes on R and at risk for wound development, Pt does have new diabetic shoes. Discussed possibility of using FreeStyle Libre with diabetic Nurse to increase compliance with management of diabetes. Will follow acutely to facilitate safe DC home.     07/01/21 1616  OT Visit Information  Last OT Received On 07/01/21  Assistance Needed +1  History of Present Illness Pt is 55 yo male who was a visitor in the ED when he had a seizure.  He was admitted on 06/30/21.  Pt admitted with seizure and hyperglycemia. Pt did have MRI that revealed Acute to subacute punctate infarctions within the pons and the left side of the splenium of the corpus callosum. Pt with hx of cardiomyopathy, DM2, HTN, HLD, CHF, CKD.  Precautions  Precautions Fall  Home Living  Family/patient expects to be discharged to: Private residence  Living Arrangements Spouse/significant other  Available Help at Discharge Family;Available 24 hours/day  Type of Home House  Home Access Stairs to enter  Entrance Stairs-Number of Steps 3  Entrance Stairs-Rails None  Home Layout One level  Bathroom Shower/Tub Tub/shower unit  Camera operator - 2 wheels;Cane - single point  Prior Function  Level of Independence Independent  Comments Pt indepedent with adls, iadls, and community ambulation;  Does occasionally use RW or cane for balance.  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  Pain Assessment  Pain Assessment No/denies pain  Cognition  Arousal/Alertness Awake/alert  Behavior During Therapy Flat affect  Overall Cognitive Status Impaired/Different from baseline  Area of Impairment Orientation;Attention;Memory;Safety/judgement;Awareness  Orientation Level Disoriented to;Situation  Current Attention Level Selective  Memory Decreased short-term memory  Safety/Judgement Decreased awareness of safety;Decreased awareness of deficits  Awareness Emergent  General Comments Wife feels cognition is slowly improving; staes he has soem memory issues at baseline  Upper Extremity Assessment  Upper Extremity Assessment Overall WFL for tasks assessed  Lower Extremity Assessment  Lower Extremity Assessment  (Apparent Charcot foot on R with amputated toes; 3-5 hammer toes - mild MASD under toes; abnormal sensation)  Cervical / Trunk Assessment  Cervical / Trunk Assessment Normal  ADL  Overall ADL's  Needs assistance/impaired  Functional mobility during ADLs Min guard (unsteady at times)  General ADL Comments Overall completing basic ADL with S; Apparently noncompliant with medication management for diabetes; has diabetic shoes  Vision- History  Baseline Vision/History Wears glasses  Wears Glasses Reading only  Patient Visual Report Blurring of vision  Vision- Assessment  Vision Assessment? Vision impaired- to be further tested in functional context  Additional Comments R eye poor vision due to diabetic retinopathy per pt's wife  Bed Mobility  Overal bed mobility Modified Independent  Transfers  Overall transfer level Needs assistance  Equipment used 1 person hand held assist  Transfers Sit to/from Stand  Sit to Stand Supervision  General transfer comment unsafe descent to stretcher  Balance  Overall balance assessment  Needs assistance  Sitting-balance support Feet supported  Sitting balance-Leahy Scale Good  Standing  balance-Leahy Scale Fair  General Comments  General comments (skin integrity, edema, etc.) Educated pt/wife on impact of uncontrolled diabetes adn risk for future health problems; wife apparently frustrated with husband and his lack of compliance "I'm not fussing with him anymore"  OT - End of Session  Activity Tolerance Patient tolerated treatment well  Patient left with call bell/phone within reach;in bed;with family/visitor present  Nurse Communication Mobility status;Other (comment) (possibility of using FreeStyle system with Diabetic coordinator)  OT Assessment  OT Recommendation/Assessment Patient needs continued OT Services  OT Visit Diagnosis Other abnormalities of gait and mobility (R26.89);Low vision, both eyes (H54.2);Other symptoms and signs involving cognitive function  OT Problem List Decreased activity tolerance;Impaired balance (sitting and/or standing);Impaired vision/perception;Decreased safety awareness;Decreased cognition;Decreased knowledge of use of DME or AE;Obesity  OT Plan  OT Frequency (ACUTE ONLY) Min 2X/week  OT Treatment/Interventions (ACUTE ONLY) Self-care/ADL training;Therapeutic exercise;DME and/or AE instruction;Therapeutic activities;Patient/family education;Balance training  AM-PAC OT "6 Clicks" Daily Activity Outcome Measure (Version 2)  Help from another person eating meals? 4  Help from another person taking care of personal grooming? 4  Help from another person toileting, which includes using toliet, bedpan, or urinal? 3  Help from another person bathing (including washing, rinsing, drying)? 3  Help from another person to put on and taking off regular upper body clothing? 3  Help from another person to put on and taking off regular lower body clothing? 3  6 Click Score 20  Progressive Mobility  What is the highest level of mobility based on the progressive mobility assessment? Level 5 (Walks with assist in room/hall) - Balance while stepping forward/back  and can walk in room with assist - Complete  Mobility Ambulated with assistance in hallway;Out of bed to chair with meals;Out of bed for toileting  OT Recommendation  Recommendations for Other Services Other (comment) (Diabetic nsg consult)  Follow Up Recommendations No OT follow up  OT Equipment None recommended by OT  Individuals Consulted  Consulted and Agree with Results and Recommendations Patient;Family member/caregiver  Family Member Consulted wife  Acute Rehab OT Goals  Patient Stated Goal return home  OT Goal Formulation With patient  Time For Goal Achievement 07/15/21  Potential to Achieve Goals Good  OT Time Calculation  OT Start Time (ACUTE ONLY) 1551  OT Stop Time (ACUTE ONLY) 1606  OT Time Calculation (min) 15 min  OT General Charges  $OT Visit 1 Visit  OT Evaluation  $OT Eval Low Complexity 1 Low  Written Expression  Dominant Hand Right  Maurie Boettcher, OT/L   Acute OT Clinical Specialist Acute Rehabilitation Services Pager [phone] Office [phone]

## 2021-07-01 NOTE — Progress Notes (Signed)
Inpatient Diabetes Program Recommendations  AACE/ADA: New Consensus Statement on Inpatient Glycemic Control (2015)  Target Ranges:  Prepandial:   less than 140 mg/dL      Peak postprandial:   less than 180 mg/dL (1-2 hours)      Critically ill patients:  140 - 180 mg/dL   Lab Results  Component Value Date   GLUCAP 184 (H) 07/01/2021   HGBA1C 14.2 (H) 06/30/2021    Review of Glycemic Control Results for AMADI, SIBAYAN (MRN [id number]) as of 07/01/2021 16:26  Ref. Range 07/01/2021 03:21 07/01/2021 04:29 07/01/2021 08:18 07/01/2021 12:24 07/01/2021 15:54  Glucose-Capillary Latest Ref Range: 70 - 99 mg/dL 89 94 145 (H) 164 (H) 184 (H)   Diabetes history: DM 2 Outpatient Diabetes medications:  Lantus 60 units q HS, Novolog 0-15 units tid with meals Current orders for Inpatient glycemic control:  Novolog sensitive q 4 hours Lantus 40 units q HS  Inpatient Diabetes Program Recommendations:    Spoke with patient and wife in the ED regarding patient's A1C=14.2%.  They both state that he has not been taking insulin for 4 months.  Wife states he is "hard headed" and patient was unable to state why he was not taking it.  We discussed the importance of glycemic control and the need for insulin in order to prevent high blood sugars and so he will have more energy.  He is interested in Continuous Glucose monitor , however I am not sure that the Medicare he has will cover this?  Will ask for benefits check.  Reiterated the importance of patient taking insulin as ordered.  Wife agreed.  Blood sugars are much better with Lantus/Novolog.   Thanks  Adah Perl, RN, BC-ADM Inpatient Diabetes Coordinator Pager [phone] (8a-5p)

## 2021-07-01 NOTE — Consult Note (Signed)
Neurology Consultation  Reason for Consult: Provoked seizure in the setting of hyperglycemia, incidental MRI brain findings of acute to subacute infarctions within the pons and left side of splenium of corpus callosum.  Referring Physician: Dr. Lonny Prude  CC: Hyperglycemia   History is obtained from: Patient, Chart Review  HPI: Jeffrey [name] is a 55 y.o. male with a medical history significant for type 2 diabetes mellitus, myocardial infarction, nonischemic cardiomyopathy with AICD, essential hypertension, coronary artery disease, CHF with EF of 30% and 2 previous provoked seizures in the setting of hyperglycemia. Patient was sitting in the waiting room in the ER when he was witnessed having a generalized tonic-clonic seizure by ER staff. Once he was brought to a room for evaluation, he was no longer seizing and was seen to be in a post-ictal state. Work up in the ED found Jeffrey [name] to have a blood glucose of > 700, potassium of 2.7, and elevated creatinine of 2.24 with a baseline creatinine of 1.9. [name] wife endorses that he has had 2 similar episodes of seizure activity all related to elevated blood glucose levels. Patient states that he has not checked his blood sugar at home in weeks and states that he ingests too many sodas and too much Kool-aid and states that he has not been taking care of himself as he should. He states that he is back to his baseline and does not endorse tongue biting or urinary incontinence during his witnessed seizure activity but does endorse acute right eye blurry vision. An MRI was obtained revealing incidental acute to subacute punctate infarctions within the pons and left side of the splenium of the corpus callosum.   ROS: A complete ROS was performed and is negative except as noted in the HPI. Past Medical History:  Diagnosis Date   AICD (automatic cardioverter/defibrillator) present    MDT Visia AF MRI   Anemia    CHF (congestive heart failure) (HCC)     Coronary artery disease    Diabetes mellitus without complication ([place])    History of kidney stones    Hypertension    Myocardial infarction ([place]), although reported, NICM at cath    Renal disorder    Past Surgical History:  Procedure Laterality Date   CARDIAC CATHETERIZATION     INSERT / REPLACE / REMOVE PACEMAKER     Family History  Problem Relation Age of Onset   Diabetes Mother    Social History:   reports that he has never smoked. He has never used smokeless tobacco. He reports that he does not drink alcohol and does not use drugs.  Medications  Current Facility-Administered Medications:    acetaminophen (TYLENOL) tablet 650 mg, 650 mg, Oral, Q6H PRN **OR** acetaminophen (TYLENOL) suppository 650 mg, 650 mg, Rectal, Q6H PRN, Doutova, Anastassia, MD   carvedilol (COREG) tablet 18.75 mg, 18.75 mg, Oral, BID WC, Barrett, Rhonda G, PA-C   dextrose 50 % solution 0-50 mL, 0-50 mL, Intravenous, PRN, Doutova, Anastassia, MD   gabapentin (NEURONTIN) capsule 400 mg, 400 mg, Oral, TID, Doutova, Anastassia, MD, 400 mg at 07/01/21 1634   hydrALAZINE (APRESOLINE) tablet 50 mg, 50 mg, Oral, TID, Barrett, Rhonda G, PA-C   HYDROcodone-acetaminophen (NORCO/VICODIN) 5-325 MG per tablet 1-2 tablet, 1-2 tablet, Oral, Q4H PRN, Doutova, Anastassia, MD   insulin aspart (novoLOG) injection 0-9 Units, 0-9 Units, Subcutaneous, Q4H, Doutova, Anastassia, MD, 2 Units at 07/01/21 1234   insulin glargine (LANTUS) injection 40 Units, 40 Units, Subcutaneous, QHS, Doutova, Anastassia, MD, 40 Units at  07/01/21 0215   isosorbide mononitrate (IMDUR) 24 hr tablet 30 mg, 30 mg, Oral, Daily, Mariel Aloe, MD   rosuvastatin (CRESTOR) tablet 40 mg, 40 mg, Oral, Daily, Doutova, Anastassia, MD, 40 mg at 07/01/21 1015  Exam: Current vital signs: BP (!) 160/106   Pulse 75   Temp 98.7 F (37.1 C) (Oral)   Resp 17   Ht '5\' 7"'$  (1.702 m)   Wt 95.3 kg   SpO2 99%   BMI 32.89 kg/m  Vital signs in last 24 hours: Temp:   [97.7 F (36.5 C)-98.7 F (37.1 C)] 98.7 F (37.1 C) (07/21 1355) Pulse Rate:  [64-88] 75 (07/21 1600) Resp:  [14-25] 17 (07/21 1600) BP: (148-174)/(81-106) 160/106 (07/21 1600) SpO2:  [96 %-100 %] 99 % (07/21 1600)  GENERAL: Awake, alert, in no acute distress Psych: Affect appropriate for situation, calm and cooperative with examination Head: Normocephalic and atraumatic, without obvious abnormality EENT: Normal conjunctivae, no OP obstruction, dry mucous membranes LUNGS: Normal respiratory effort. Non-labored breathing on room air CV: Regular rate on cardiac monitor, extremities warm, without edema ABDOMEN: Soft, rounded, non-tender Ext: warm, well perfused, s/p toe amputation on right foot   NEURO:  Mental Status: Awake, alert, and oriented to person, place, and time. He is unable to provide details regarding history of present illness except that he has not been taking care of himself at home.  Speech/Language: speech is intact without dysarthria or aphasia. Naming, repetition, fluency, and comprehension intact. No neglect noted Cranial Nerves:  II: PERRL 4 mm/brisk. Visual fields full with c/o right eye blurry vision III, IV, VI: EOMI without ptosis, nystagmus, or gaze palsy V: Sensation is intact to light touch and symmetrical to face.  VII: Face is symmetric resting and smiling.  VIII: Hearing is intact to voice IX, X: Palate elevation is symmetric. Phonation normal.  XI: Normal sternocleidomastoid and trapezius muscle strength XII: Tongue protrudes midline without fasciculations.   Motor: 5/5 strength is all muscle groups without vertical drift on assessment, no asymmetry noted Tone is normal. Bulk is normal.  Sensation: Intact to light touch bilaterally in all four extremities. No extinction to DSS present.  Coordination: FTN intact bilaterally. HKS intact bilaterally. No pronator drift. DTRs: 2+ and symmetric bilateral patellae and biceps Gait: Deferred  1a Level of  Conscious.: 0 1b LOC Questions: 0 1c LOC Commands: 0 2 Best Gaze: 0 3 Visual: 0 4 Facial Palsy: 0 5a Motor Arm - left: 0 5b Motor Arm - Right: 0 6a Motor Leg - Left: 0 6b Motor Leg - Right: 0 7 Limb Ataxia: 0 8 Sensory: 0 9 Best Language: 0 10 Dysarthria: 0 11 Extinct. and Inatten.: 0 TOTAL: 0  Labs I have reviewed labs in epic and the results pertinent to this consultation are: CBC    Component Value Date/Time   WBC 5.6 07/01/2021 0323   RBC 4.51 07/01/2021 0323   HGB 11.3 (L) 07/01/2021 0323   HCT 36.0 (L) 07/01/2021 0323   PLT 207 07/01/2021 0323   MCV 79.8 (L) 07/01/2021 0323   MCH 25.1 (L) 07/01/2021 0323   MCHC 31.4 07/01/2021 0323   RDW 14.2 07/01/2021 0323   LYMPHSABS 1.5 07/01/2021 0323   MONOABS 0.4 07/01/2021 0323   EOSABS 0.1 07/01/2021 0323   BASOSABS 0.0 07/01/2021 0323   CMP     Component Value Date/Time   NA 137 07/01/2021 0323   K 2.6 (LL) 07/01/2021 0323   CL 104 07/01/2021 0323   CO2 27  07/01/2021 0323   GLUCOSE 86 07/01/2021 0323   BUN 20 07/01/2021 0323   CREATININE 1.51 (H) 07/01/2021 0323   CALCIUM 8.7 (L) 07/01/2021 0323   PROT 6.1 (L) 07/01/2021 0233   ALBUMIN 2.8 (L) 07/01/2021 0233   AST 16 07/01/2021 0233   ALT 13 07/01/2021 0233   ALKPHOS 65 07/01/2021 0233   BILITOT 0.7 07/01/2021 0233   GFRNONAA 54 (L) 07/01/2021 0323   Lipid Panel  No results found for: CHOL, TRIG, HDL, CHOLHDL, VLDL, LDLCALC, LDLDIRECT  Lab Results  Component Value Date   HGBA1C 14.2 (H) 06/30/2021   Imaging I have reviewed the images obtained:  CT-scan of the brain 06/30/2021: 1. Interval development of a hyperdense right globe. Finding may be related to hematoma versus postsurgical changes. Recommend correlation with physical exam and clinical history. If no recent surgical procedure in the last year, recommend emergent ophthalmological consultation. 2.  Otherwise no acute intracranial abnormality.  MRI examination of the brain  06/30/2021: Background pattern of chronic small vessel ischemic changes of the white matter with old small vessel infarctions in the right external capsule, both basal ganglia and the right thalamus. Acute to subacute punctate infarctions within the pons and the left side of the splenium of the corpus callosum. No evidence of mass effect or hemorrhage.  Routine EEG 07/01/2021: "This study is within normal limits. No seizures or epileptiform discharges were seen throughout the recording"  Assessment: 55 y.o. male who presented with a provoked GTC in the setting of severe hyperglycemia with an incidental finding of acute to subacute punctate infarctions within the pons and left side of the splenium of the corpus callosum.  - Examination reveals patient at baseline mental and functional status without acute neurologic deficit with only complaints of acute right eye blurry vision. NIHSS of 0.  - MRI brain findings of acute to subacute punctate infarctions within the pons and the left side of the splenium of the corpus callosum are incidental findings noncontributory to patient seizure activity in the setting of severe hyperglycemia.  - Etiology of stroke unclear but patient with multiple risk factors including uncontrolled type 2 DM, obesity, CAD, CHF with EF of 30%, CAD, hypertension, remote MI, previous CVA identified on imaging. Will complete stroke work up for further optimization.  Impression: Acute punctate infarctions Hyperglycemia with uncontrolled type 2 diabetes mellitus- A1c of 14.2% Provoked seizure in the setting of severe hyperglycemia  Recommendations: - Lipid panel, initiate statin therapy for LDL goal of < 70 - Hemoblobin A1c grossly elevated at 14.2, goal A1c < 7% - MRA head and neck without contrast - Frequent neuro checks - Echocardiogram pending - Prophylactic therapy- Antiplatelet med / DAPT: Aspirin - 81 mg PO daily + clopidogrel 75 mg daily together for 21 days followed by ASA  81 mg monotherapy - Risk factor modification - Telemetry monitoring - PT consult, OT consult, Speech consult - Stroke team to follow  Pt seen by NP/Neuro and later by MD. Note/plan to be edited by MD as needed.  Jeffrey [name], AGAC-NP [facility] Pager: [phone]  Neurology Attending Attestation   I examined the patient and discussed plan with Ms. Toberman NP. Above note has been edited by me to reflect my findings and recommendations. No indication for AED for provoked seizure in setting of severe hypoglycemia. Incidental finding acute infarct warrants stroke workup to optimize secondary stroke prevention. Stroke team to follow.   Su Monks, MD [facility] [phone]   If 7pm- 7am, please  page neurology on call as listed in [place].

## 2021-07-01 NOTE — ED Notes (Signed)
Pt transported to MRI 

## 2021-07-01 NOTE — Progress Notes (Signed)
PROGRESS NOTE    Jeffrey [name]  [id number] DOB: 20-Oct-1966 DOA: 06/30/2021 PCP: Charlott Rakes, MD   Brief Narrative: Jeffrey [name] is a 55 y.o. 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 Patient presented after witnessed tonic clonic seizure in the ED waiting room. He was found to have severely elevated blood sugar concerning for HHS. He was given IV fluids and IV insulin with improvement. During his workup for seizure, he was found to have an incidental finding of two punctate infarcts. Neurology consulted.   Assessment & Plan:   Active Problems:   Hyperglycemia   Prolonged QT interval   Acute on chronic renal failure (HCC)   Hypokalemia   Hyperosmolar hyperglycemic state (HHS) (HCC)   Chronic systolic CHF (congestive heart failure) (HCC)   Essential hypertension   Uncontrolled type 2 DM with hyperosmolar nonketotic hyperglycemia (HCC)   Anemia due to chronic kidney disease   CKD (chronic kidney disease), stage III (HCC)   CAD (coronary artery disease)   Fall due to seizure ([facility])   Elevated troponin   Seizure Tonic-clonic. Unsure of duration. Likely secondary to extremely high blood sugar. Possible this is actually the second time this may have happened per discussion with wife. EEG without seizure activity noted. No AED started. Patient will need driving precautions on discharge.  Hyperosmolar hyperglycemic state Secondary to medication non-adherence and likely dehydration. Patient managed with IV fluids and IV insulin with significant improvement of blood sugar. No anion gap acidosis. Patient transitioned to Lantus and SSI.  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 Discussed with neurology who will see and workup overnight. -Transthoracic Echocardiogram -Neurology recommendations pending  Diabetes  mellitus, type 2 Neuropathy -Continue Lantus 40 units qhs -Continue SSI for now -Continue gabapentin  AKI on CKD 3a [name] baseline is around 1.3-1.5. Back to baseline with IV fluids.  Chronic combined systolic and diastolic heart failure Nonischemic cardiomyopathy CAD Transthoracic Echocardiogram from 03/2021 significant for an EF of 30-35% with grade 2 diastolic dysfunction. Fluid balance improved. Patient is s/p AICD recently placed. He is on Lasix 40 mg daily as an outpatient in addition to Coreg. Not on an ACEi or ARB presumably secondary to kidney disease, but unsure. -Continue Coreg -Will likely resume Lasix in AM  Primary hypertension Patient is on hydralazine, Coreg and Imdur as an outpatient. -Continue Coreg and hydralazine -Restart home Imdur  Anemia Mild and stable.  Demand ischemia Flat trend in setting of kidney disease and seizure. No chest pain.   DVT prophylaxis: SCDs Start: 06/30/21 2310 Code Status:   Code Status: Full Code Family Communication: Wife at bedside Disposition Plan: Discharge home likely in AM after stroke workup   Consultants:  Neurology  Procedures:  EEG (07/01/2021) IMPRESSION: This study is within normal limits. No seizures or epileptiform discharges were seen throughout the recording.  Antimicrobials: None    Subjective: No issues today. States he drinks a lot of sugary drinks. Per wife, he is not very adherent with medications and there are a lot of stressors at this time making it difficult for her to help him.   Objective: Vitals:   07/01/21 0300 07/01/21 0500 07/01/21 0726 07/01/21 0800  BP: (!) 158/103 (!) 169/95  (!) 148/86  Pulse: 70 78  64  Resp: 16 (!) 24  14  Temp:   97.7 F (36.5 C)   TempSrc:  Oral   SpO2: 98% 99%  100%  Weight:      Height:        Intake/Output Summary (Last 24 hours) at 07/01/2021 1008 Last data filed at 07/01/2021 0709 Gross per 24 hour  Intake 966.34 ml  Output 600 ml  Net 366.34  ml   Filed Weights   06/30/21 1736  Weight: 95.3 kg    Examination:  General exam: Appears calm and comfortable and in no acute distress. Conversant Respiratory: Clear to auscultation. Respiratory effort normal with no intercostal retractions or use of accessory muscles Cardiovascular: S1 & S2 heard, RRR. No murmurs, rubs, gallops or clicks.  Gastrointestinal: Abdomen is nondistended, soft and nontender. No masses felt. Normal bowel sounds heard Neurologic: No focal neurological deficits Musculoskeletal: No calf tenderness Skin: No cyanosis. No new rashes Psychiatry: Alert and oriented. Memory intact. Mood & affect appropriate    Data Reviewed: I have personally reviewed following labs and imaging studies  CBC Lab Results  Component Value Date   WBC 5.6 07/01/2021   RBC 4.51 07/01/2021   HGB 11.3 (L) 07/01/2021   HCT 36.0 (L) 07/01/2021   MCV 79.8 (L) 07/01/2021   MCH 25.1 (L) 07/01/2021   PLT 207 07/01/2021   MCHC 31.4 07/01/2021   RDW 14.2 07/01/2021   LYMPHSABS 1.5 07/01/2021   MONOABS 0.4 07/01/2021   EOSABS 0.1 07/01/2021   BASOSABS 0.0 [id number]     Last metabolic panel Lab Results  Component Value Date   NA 137 07/01/2021   K 2.6 (LL) 07/01/2021   CL 104 07/01/2021   CO2 27 07/01/2021   BUN 20 07/01/2021   CREATININE 1.51 (H) 07/01/2021   GLUCOSE 86 07/01/2021   GFRNONAA 54 (L) 07/01/2021   CALCIUM 8.7 (L) 07/01/2021   PHOS 3.0 07/01/2021   PROT 6.1 (L) 07/01/2021   ALBUMIN 2.8 (L) 07/01/2021   BILITOT 0.7 07/01/2021   ALKPHOS 65 07/01/2021   AST 16 07/01/2021   ALT 13 07/01/2021   ANIONGAP 6 07/01/2021    CBG (last 3)  Recent Labs    07/01/21 0321 07/01/21 0429 07/01/21 0818  GLUCAP 89 94 145*     GFR: Estimated Creatinine Clearance: 60.1 mL/min (A) (by C-G formula based on SCr of 1.51 mg/dL (H)).  Coagulation Profile: No results for input(s): INR, PROTIME in the last 168 hours.  Recent Results (from the past 240 hour(s))  SARS  CORONAVIRUS 2 (TAT 6-24 HRS) Nasopharyngeal Nasopharyngeal Swab     Status: None   Collection Time: 06/30/21  4:43 PM   Specimen: Nasopharyngeal Swab  Result Value Ref Range Status   SARS Coronavirus 2 NEGATIVE NEGATIVE Final    Comment: (NOTE) SARS-CoV-2 target nucleic acids are NOT DETECTED.  The SARS-CoV-2 RNA is generally detectable in upper and lower respiratory specimens during the acute phase of infection. Negative results do not preclude SARS-CoV-2 infection, do not rule out co-infections with other pathogens, and should not be used as the sole basis for treatment or other patient management decisions. Negative results must be combined with clinical observations, patient history, and epidemiological information. The expected result is Negative.  Fact Sheet for Patients: SugarRoll.be  Fact Sheet for Healthcare Providers: https://www.woods-mathews.com/  This test is not yet approved or cleared by the [place] FDA and  has been authorized for detection and/or diagnosis of SARS-CoV-2 by FDA under an Emergency Use Authorization (EUA). This EUA will remain  in effect (meaning this test can be used) for the duration of the  COVID-19 declaration under Se ction 564(b)(1) of the Act, 21 U.S.C. section 360bbb-3(b)(1), unless the authorization is terminated or revoked sooner.  Performed at [facility], [place] [address] [place], [place] [address]         Radiology Studies: CT HEAD WO CONTRAST  Result Date: 06/30/2021 CLINICAL DATA:  Mental status change. Unknown cause. visitor in a recliner and had an active tonic clonic seizure in the waiting room. Unclear per provider and patient if recent surgical history the orbit. EXAM: CT HEAD WITHOUT CONTRAST TECHNIQUE: Contiguous axial images were obtained from the base of the skull through the vertex without intravenous contrast. COMPARISON:  Head 06/12/2020 FINDINGS: Brain: Cerebral  ventricle sizes are concordant with the degree of cerebral volume loss. Patchy and confluent areas of decreased attenuation are noted throughout the deep and periventricular white matter of the cerebral hemispheres bilaterally, compatible with chronic microvascular ischemic disease. No evidence of large-territorial acute infarction. No parenchymal hemorrhage. No mass lesion. No extra-axial collection. No mass effect or midline shift. No hydrocephalus. Basilar cisterns are patent. Vascular: No hyperdense vessel. Skull: No acute fracture or focal lesion. Sinuses/Orbits: Paranasal sinuses and mastoid air cells are clear. Interval development of a hyperdense right globe. The left orbit is unremarkable. Other: None. IMPRESSION: 1. Interval development of a hyperdense right globe. Finding may be related to hematoma versus postsurgical changes. Recommend correlation with physical exam and clinical history. If no recent surgical procedure in the last year, recommend emergent ophthalmological consultation. 2.  Otherwise no acute intracranial abnormality. These results were called by telephone at the time of interpretation on 06/30/2021 at 6:01 pm to provider MARGAUX VENTER , who verbally acknowledged these results. Electronically Signed   By: Iven Finn M.D.   On: 06/30/2021 18:04   DG Chest Port 1 View  Result Date: 06/30/2021 CLINICAL DATA:  CP, SOB, coughing Pacemaker placed but pt unsure of what year Hx of CH EXAM: PORTABLE CHEST 1 VIEW COMPARISON:  CT chest 12/03/2019, chest x-ray 06/12/2020 FINDINGS: The heart size and mediastinal contours are unchanged. Left chest wall single lead cardiac device in stable position. Persistent left base atelectasis with minimally elevated left hemidiaphragm. No focal consolidation. No pulmonary edema. No pleural effusion. No pneumothorax. No acute osseous abnormality. IMPRESSION: No active disease. Electronically Signed   By: Iven Finn M.D.   On: 06/30/2021 18:01         Scheduled Meds:  carvedilol  12.5 mg Oral BID WC   gabapentin  400 mg Oral TID   hydrALAZINE  25 mg Oral TID   insulin aspart  0-9 Units Subcutaneous Q4H   insulin glargine  40 Units Subcutaneous QHS   potassium chloride  40 mEq Oral Q4H   rosuvastatin  40 mg Oral Daily   Continuous Infusions:  sodium chloride Stopped (07/01/21 0211)   sodium chloride 75 mL/hr at 07/01/21 0213   dextrose 5% lactated ringers Stopped (07/01/21 0011)   lactated ringers Stopped (06/30/21 2048)     LOS: 0 days     Cordelia Poche, MD [facility] 07/01/2021, 10:08 AM  If 7PM-7AM, please contact night-coverage [email or web address]

## 2021-07-01 NOTE — Progress Notes (Signed)
EEG complete - results pending 

## 2021-07-01 NOTE — Evaluation (Signed)
Physical Therapy Evaluation Patient Details Name: Jeffrey [name] MRN: [id number] DOB: [birth year] Today's Date: 07/01/2021   History of Present Illness  Pt is 55 yo male who was a visitor in the ED when he had a seizure.  He was admitted on 06/30/21.  Pt admitted with seizure and hyperglycemia. Pt did have MRI that revealed Acute to subacute punctate infarctions within the pons and the left side of the splenium of the corpus callosum. Pt with hx of cardiomyopathy, DM2, HTN, HLD, CHF, CKD.  Clinical Impression  Pt admitted with above diagnosis. At baseline, pt is fairly independent and resides with wife.  Wife reports he does have mild issues with balance at baseline and had 1 fall since January.  Today, pt ambulated 300' without AD - demonstrated mild balance deficits.  He did have decreased attention at times and decreased safety awareness - wife reported that pt's cognition seemed near baseline.  Defer to OT for further screening. Will benefit from acute PT to advance, but likely no needs at d/c.  Pt currently with functional limitations due to the deficits listed below (see PT Problem List). Pt will benefit from skilled PT to increase their independence and safety with mobility to allow discharge to the venue listed below.       Follow Up Recommendations No PT follow up    Equipment Recommendations  None recommended by PT    Recommendations for Other Services       Precautions / Restrictions Precautions Precautions: Fall      Mobility  Bed Mobility Overal bed mobility: Needs Assistance Bed Mobility: Supine to Sit;Sit to Supine     Supine to sit: Supervision Sit to supine: Supervision        Transfers Overall transfer level: Needs assistance Equipment used: None Transfers: Sit to/from Stand Sit to Stand: Supervision            Ambulation/Gait Ambulation/Gait assistance: Supervision Gait Distance (Feet): 300 Feet   Gait Pattern/deviations: Drifts right/left Gait  velocity: decreased   General Gait Details: Pt did have some mild unsteadiness with drifting R/L but no overt LOB.  Wife reports pt with poor balance at times at baseline.  Stairs Stairs: Yes Stairs assistance: Min guard Stair Management: One rail Left;Step to pattern Number of Stairs: 1 General stair comments: Performed on a step stool  Wheelchair Mobility    Modified Rankin (Stroke Patients Only) Modified Rankin (Stroke Patients Only) Pre-Morbid Rankin Score: Slight disability Modified Rankin: Slight disability     Balance Overall balance assessment: Needs assistance   Sitting balance-Leahy Scale: Normal       Standing balance-Leahy Scale: Good Standing balance comment: Able to look up/down/L/R, stop, turn , and step over object without LOB                             Pertinent Vitals/Pain Pain Assessment: No/denies pain    Home Living Family/patient expects to be discharged to:: Private residence Living Arrangements: Spouse/significant other Available Help at Discharge: Family;Available 24 hours/day Type of Home: House Home Access: Stairs to enter Entrance Stairs-Rails: None Entrance Stairs-Number of Steps: 3 Home Layout: One level Home Equipment: Clinical cytogeneticist - 2 wheels;Cane - single point      Prior Function Level of Independence: Independent         Comments: Pt indepedent with adls, iadls, and community ambulation;  Does occasionally use RW or cane for balance.  Has had 1 fall in past  6 months     Hand Dominance        Extremity/Trunk Assessment   Upper Extremity Assessment Upper Extremity Assessment: Defer to OT evaluation    Lower Extremity Assessment Lower Extremity Assessment: Overall WFL for tasks assessed (ROM WFL, MMT 5/5, coordination WNL, sensation intact)    Cervical / Trunk Assessment Cervical / Trunk Assessment: Normal  Communication   Communication: No difficulties  Cognition Arousal/Alertness:  Awake/alert Behavior During Therapy: WFL for tasks assessed/performed Overall Cognitive Status: Impaired/Different from baseline Area of Impairment: Orientation;Problem solving;Attention;Awareness;Safety/judgement                 Orientation Level: Situation Current Attention Level: Sustained     Safety/Judgement: Decreased awareness of safety Awareness: Emergent   General Comments: Pt was not initially aware why he was in hospital but after wife told him he was able to recall that he was visiting someone and then doesn't know what happen.  Does have some decreased safety awareness, easily distracted, and decreased attention.      General Comments      Exercises     Assessment/Plan    PT Assessment Patient needs continued PT services  PT Problem List Decreased safety awareness;Decreased balance;Decreased knowledge of use of DME       PT Treatment Interventions DME instruction;Therapeutic activities;Gait training;Patient/family education;Balance training;Stair training;Therapeutic exercise;Functional mobility training;Neuromuscular re-education    PT Goals (Current goals can be found in the Care Plan section)  Acute Rehab PT Goals Patient Stated Goal: return home PT Goal Formulation: With patient/family Time For Goal Achievement: 07/15/21 Potential to Achieve Goals: Good Additional Goals Additional Goal #1: Pt will score >19 on DGI to indicate lower fall risk    Frequency Min 4X/week   Barriers to discharge        Co-evaluation               AM-PAC PT "6 Clicks" Mobility  Outcome Measure Help needed turning from your back to your side while in a flat bed without using bedrails?: None Help needed moving from lying on your back to sitting on the side of a flat bed without using bedrails?: None Help needed moving to and from a bed to a chair (including a wheelchair)?: None Help needed standing up from a chair using your arms (e.g., wheelchair or bedside  chair)?: A Little Help needed to walk in hospital room?: A Little Help needed climbing 3-5 steps with a railing? : A Little 6 Click Score: 21    End of Session Equipment Utilized During Treatment: Gait belt Activity Tolerance: Patient tolerated treatment well Patient left: in bed;with call bell/phone within reach (OT with pt) Nurse Communication: Mobility status PT Visit Diagnosis: Unsteadiness on feet (R26.81)    Time: [id number] PT Time Calculation (min) (ACUTE ONLY): 22 min   Charges:   PT Evaluation $PT Eval Moderate Complexity: 1 Mod          Corlette Ciano, PT Acute Rehab Services Pager [phone] [facility] [phone]   Karlton Lemon 07/01/2021, 4:17 PM

## 2021-07-01 NOTE — Progress Notes (Signed)
Patient from ED for MRI. Patient has [place]. Transmission sent. Orders received for OVO

## 2021-07-01 NOTE — Consult Note (Signed)
Cardiology Consultation:   Patient ID: Jeffrey [name] MRN: [id number]; DOB: [birth year]  Admit date: 06/30/2021 Date of Consult: 07/01/2021  PCP:  Charlott Rakes, MD   [facility] Providers Cardiologist:  Mertie Moores, MD  Electrophysiologist:  Virl Axe, MD  {   Patient Profile:   Jeffrey [name] is a 55 y.o. male with a hx of NICM w/ EF 10-20% >> 30-35%, syncope s/p MDT VISIA AF MRI ICD VVI mode, S-D-CHF, non-obs CAD, DM, HTN, HLD, CKD III, med noncompliance, who is being seen 07/01/2021 for the evaluation of acute on chronic combined CHF at the request of Dr Lonny Prude.  History of Present Illness:   Jeffrey [name] was referred for Biostim, but has not followed up and has not had this done.  He was admitted 07/20 after he had a tonic/clonic seizure while in the waiting room as a visitor with another patient. No seizures seen on EEG, K+ 2.7 and gluc > 700. BP 201/104. He was given IV meds including K+, insulin, D5LR, LR, NS, and MgSO4.   Jeffrey [name] is here with his sister, whom he lives with.  She tries to help him be compliant with diet and meds, but has had multiple family issues and her daughter and grandchildren.  She has not really had time to help him.  Jeffrey [name] admits that he has been drinking a lot of sodas.  His sister states that he eats cookies and chips and other snacks off and on all day and all night.  He does not consistently take his insulin.  He does not consistently take his medications.  He does not weigh himself.  He does not know the amount of salt or carbohydrates in any of the foods that he eats.  He has noticed an increase in his weight, but does not know how much because he does not weigh himself.  He has scales.  He has noticed an increase in his shortness of breath and his sister reports PND.  He denies orthopnea.  He has noticed an increase in his girth and that his clothes are not fitting as well.  He has not noticed lower extremity edema.  He  buys his own groceries, but generally does not buy any healthy food.  He has not been checking his sugars, so did not realize he was that sick.  He felt like he was in his usual state of health until he had a seizure in the waiting room here in the hospital.  He does not feel bad right now.  He denies any other seizure history.  He admits that he needs to do better.  He says he will do what he is supposed to do.  He does not check his blood pressure very often, but has a BP cuff.  He says it is always high.   Past Medical History:  Diagnosis Date   AICD (automatic cardioverter/defibrillator) present    MDT Visia AF MRI   Anemia    CHF (congestive heart failure) (HCC)    Coronary artery disease    Diabetes mellitus without complication ([place])    History of kidney stones    Hypertension    Myocardial infarction ([place]), although reported, NICM at cath    Renal disorder     Past Surgical History:  Procedure Laterality Date   CARDIAC CATHETERIZATION     INSERT / REPLACE / REMOVE PACEMAKER       Home Medications:  Prior to Admission medications   Medication Sig Start  Date End Date Taking? Authorizing Provider  carvedilol (COREG) 12.5 MG tablet Take 12.5 mg by mouth 2 (two) times daily with a meal.   Yes [provider]  furosemide (LASIX) 40 MG tablet Take 40 mg by mouth daily.   Yes [provider]  gabapentin (NEURONTIN) 400 MG capsule Take 400 mg by mouth 3 (three) times daily.   Yes [provider]  hydrALAZINE (APRESOLINE) 25 MG tablet Take 25 mg by mouth 3 (three) times daily.   Yes [provider]  isosorbide mononitrate (IMDUR) 30 MG 24 hr tablet Take 30 mg by mouth daily.   Yes [provider]  LANTUS SOLOSTAR 100 UNIT/ML Solostar Pen Inject 60 Units into the skin. 04/26/21  Yes [provider]  NOVOLOG 100 UNIT/ML injection Inject 0-15 Units into the skin 3 (three) times daily with meals. Per sliding scale 04/02/21  Yes  [provider]  rosuvastatin (CRESTOR) 40 MG tablet Take 40 mg by mouth daily.   Yes [provider]    Inpatient Medications: Scheduled Meds:  carvedilol  12.5 mg Oral BID WC   gabapentin  400 mg Oral TID   hydrALAZINE  25 mg Oral TID   insulin aspart  0-9 Units Subcutaneous Q4H   insulin glargine  40 Units Subcutaneous QHS   rosuvastatin  40 mg Oral Daily   Continuous Infusions:  sodium chloride Stopped (07/01/21 0211)   sodium chloride 75 mL/hr at 07/01/21 1235   dextrose 5% lactated ringers Stopped (07/01/21 0011)   lactated ringers Stopped (06/30/21 2048)   PRN Meds: acetaminophen **OR** acetaminophen, dextrose, HYDROcodone-acetaminophen  Allergies:   No Known Allergies  Social History:   Social History   Socioeconomic History   Marital status: Unknown    Spouse name: Not on file   Number of children: Not on file   Years of education: Not on file   Highest education level: Not on file  Occupational History   Not on file  Tobacco Use   Smoking status: Never   Smokeless tobacco: Never  Substance and Sexual Activity   Alcohol use: Never   Drug use: Never   Sexual activity: Not on file  Other Topics Concern   Not on file  Social History Narrative   Not on file   Social Determinants of Health   Financial Resource Strain: Not on file  Food Insecurity: Not on file  Transportation Needs: Not on file  Physical Activity: Not on file  Stress: Not on file  Social Connections: Not on file  Intimate Partner Violence: Not on file    Family History:    Family History  Problem Relation Age of Onset   Diabetes Mother     Family Status  Relation Name Status   Mother  Deceased   Father  Deceased   Sister  Alive     ROS:  Please see the history of present illness.  All other ROS reviewed and negative.     Physical Exam/Data:   Vitals:   07/01/21 0800 07/01/21 1105 07/01/21 1355 07/01/21 1600  BP: (!) 148/86 (!) 174/91  (!) 160/106   Pulse: 64 70  75  Resp: '14 18  17  '$ Temp:   98.7 F (37.1 C)   TempSrc:   Oral   SpO2: 100% 98%  99%  Weight:      Height:        Intake/Output Summary (Last 24 hours) at 07/01/2021 1632 Last data filed at 07/01/2021 0709 Gross per  24 hour  Intake 966.34 ml  Output 600 ml  Net 366.34 ml   Last 3 Weights 06/30/2021  Weight (lbs) 210 lb  Weight (kg) 95.255 kg     Body mass index is 32.89 kg/m.  General:  Well nourished, well developed, in no acute distress HEENT: normal Lymph: no adenopathy Neck: JVD 9 cm Endocrine:  No thryomegaly Vascular: No carotid bruits; 4/4 extremity pulses 2+ bilaterally Cardiac:  normal S1, S2; RRR; no murmur  Lungs: decreased BS bases bilaterally, no wheezing, rhonchi or rales  Abd: soft, nontender, no hepatomegaly  Ext: no edema Musculoskeletal:  No deformities, BUE and BLE strength normal and equal Skin: warm and dry  Neuro:  CNs 2-12 intact, no focal abnormalities noted Psych:  Normal affect   EKG:  The EKG was personally reviewed and demonstrates: 7/20 ECG is sinus tach, heart rate 100, mild inferior ST changes Telemetry:  Telemetry was personally reviewed and demonstrates: Sinus rhythm  Relevant CV Studies:  ECHO: 03/30/2021  1. Left ventricular ejection fraction, by estimation, is 30 to 35%. The left ventricle has moderately decreased function. The left ventricle demonstrates global hypokinesis. There is mild concentric left ventricular hypertrophy. Left ventricular  diastolic parameters are consistent with Grade II diastolic dysfunction (pseudonormalization). The average left ventricular global longitudinal strain is -12.0 %. The global longitudinal strain is abnormal.   2. Right ventricular systolic function is normal. The right ventricular size is normal. There is normal pulmonary artery systolic pressure.   3. The mitral valve is grossly normal. No evidence of mitral valve  regurgitation.   4. The aortic valve is abnormal. There is mild  thickening of the aortic valve. Aortic valve regurgitation is not visualized. Mild aortic valve sclerosis is present, with no evidence of aortic valve stenosis.   5. The inferior vena cava is normal in size with greater than 50%  respiratory variability, suggesting right atrial pressure of 3 mmHg.   Comparison(s): A prior study was performed on 01/31/2017. Prior images reviewed side by side. Similar to prior; Strain imaging from this study is more concordate with LVEF in this study.   CARDIAC CATH: 01/31/2017 1st RPLB lesion, 60 %stenosed. Dist RCA lesion, 60 %stenosed. Prox RCA lesion, 55 %stenosed. Prox LAD lesion, 10 %stenosed. There is severe left ventricular systolic dysfunction. LV end diastolic pressure is mildly elevated.   Severe global LV dysfunction with an ejection fraction of 10-15%.  The pattern is one of a nonischemic cardiomyopathy.   Mild coronary obstructive disease with smooth 10% narrowing in the LAD; normal ramus intermediate, normal left circumflex; and RCA with 50-60% proximal stenosis and distal tapering of 60%.  Prior to giving rise to 3 small distal branches with 60% narrowing in a small inferior LV branch.  The patient's LV dysfunction is out of proportion to his CAD.   RECOMMENDATION: With the patient's severe LV dysfunction, it is recommended that the patient be transferred to 4 N stepdown unit rather than return back to [facility].  Initiation of medical therapy with carvedilol, spironolactone, and probable initiation of angiotension receptorblocker/neprilysin inhibition therapy.  Consider short-term life-vest prior to discharge to allow for possible medication induced improvement in LV function.    Laboratory Data:  High Sensitivity Troponin:   Recent Labs  Lab 06/30/21 1844 06/30/21 2044 06/30/21 2306 07/01/21 0233  TROPONINIHS 51* 54* 59* 59*     Chemistry Recent Labs  Lab 06/30/21 2306 07/01/21 0233 07/01/21 0323  NA 136 139 137  K 2.5* 2.5*  2.6*  CL 103 102 104  CO2 '25 24 27  '$ GLUCOSE 161* 110* 86  BUN 21* 22* 20  CREATININE 1.64* 1.58* 1.51*  CALCIUM 9.1 9.1 8.7*  GFRNONAA 49* 51* 54*  ANIONGAP '8 13 6    '$ Recent Labs  Lab 06/30/21 1625 07/01/21 0233  PROT 7.2 6.1*  ALBUMIN 3.4* 2.8*  AST 20 16  ALT 15 13  ALKPHOS 85 65  BILITOT 0.6 0.7   Hematology Recent Labs  Lab 06/30/21 1625 06/30/21 1655 07/01/21 0323  WBC 7.3  --  5.6  RBC 4.92  --  4.51  HGB 12.1* 12.9*  13.3 11.3*  HCT 39.9 38.0*  39.0 36.0*  MCV 81.1  --  79.8*  MCH 24.6*  --  25.1*  MCHC 30.3  --  31.4  RDW 14.0  --  14.2  PLT 247  --  207   BNP Recent Labs  Lab 06/30/21 1935  BNP 30.6    DDimer No results for input(s): DDIMER in the last 168 hours. Lab Results  Component Value Date   HGBA1C 14.2 (H) 06/30/2021   Lab Results  Component Value Date   TSH 0.709 07/01/2021   No results found for: CHOL, HDL, LDLCALC, LDLDIRECT, TRIG, CHOLHDL   Radiology/Studies:  CT HEAD WO CONTRAST  Result Date: 06/30/2021 CLINICAL DATA:  Mental status change. Unknown cause. visitor in a recliner and had an active tonic clonic seizure in the waiting room. Unclear per provider and patient if recent surgical history the orbit. EXAM: CT HEAD WITHOUT CONTRAST TECHNIQUE: Contiguous axial images were obtained from the base of the skull through the vertex without intravenous contrast. COMPARISON:  Head 06/12/2020 FINDINGS: Brain: Cerebral ventricle sizes are concordant with the degree of cerebral volume loss. Patchy and confluent areas of decreased attenuation are noted throughout the deep and periventricular white matter of the cerebral hemispheres bilaterally, compatible with chronic microvascular ischemic disease. No evidence of large-territorial acute infarction. No parenchymal hemorrhage. No mass lesion. No extra-axial collection. No mass effect or midline shift. No hydrocephalus. Basilar cisterns are patent. Vascular: No hyperdense vessel. Skull: No acute  fracture or focal lesion. Sinuses/Orbits: Paranasal sinuses and mastoid air cells are clear. Interval development of a hyperdense right globe. The left orbit is unremarkable. Other: None. IMPRESSION: 1. Interval development of a hyperdense right globe. Finding may be related to hematoma versus postsurgical changes. Recommend correlation with physical exam and clinical history. If no recent surgical procedure in the last year, recommend emergent ophthalmological consultation. 2.  Otherwise no acute intracranial abnormality. These results were called by telephone at the time of interpretation on 06/30/2021 at 6:01 pm to provider MARGAUX VENTER , who verbally acknowledged these results. Electronically Signed   By: Iven Finn M.D.   On: 06/30/2021 18:04   MR BRAIN WO CONTRAST  Result Date: 07/01/2021 CLINICAL DATA:  Seizure like activity yesterday. Mental status changes. EXAM: MRI HEAD WITHOUT CONTRAST TECHNIQUE: Multiplanar, multiecho pulse sequences of the brain and surrounding structures were obtained without intravenous contrast. COMPARISON:  Head CT yesterday. FINDINGS: Brain: Diffusion imaging shows a few punctate foci of acute infarction within the pons. There is a small region of acute to subacute infarction at the left splenium of corpus callosum. Punctate focus of acute infarction in the right frontal subcortical white matter. No confluent large infarction. There are mild moderate chronic small-vessel ischemic changes of the cerebral hemispheric white matter. Old small vessel infarction in the external capsule on the right, in the left basal  ganglia and in the medial right thalamus. No large vessel territory stroke. No mass, hemorrhage, hydrocephalus or extra-axial collection. Mesial temporal lobes normal. Vascular: Major vessels at the base of the brain show flow. Skull and upper cervical spine: Negative Sinuses/Orbits: Clear/normal Other: None IMPRESSION: Background pattern of chronic small vessel  ischemic changes of the white matter with old small vessel infarctions in the right external capsule, both basal ganglia and the right thalamus. Acute to subacute punctate infarctions within the pons and the left side of the splenium of the corpus callosum. No evidence of mass effect or hemorrhage. Electronically Signed   By: Nelson Chimes M.D.   On: 07/01/2021 15:07   DG Chest Port 1 View  Result Date: 06/30/2021 CLINICAL DATA:  CP, SOB, coughing Pacemaker placed but pt unsure of what year Hx of CH EXAM: PORTABLE CHEST 1 VIEW COMPARISON:  CT chest 12/03/2019, chest x-ray 06/12/2020 FINDINGS: The heart size and mediastinal contours are unchanged. Left chest wall single lead cardiac device in stable position. Persistent left base atelectasis with minimally elevated left hemidiaphragm. No focal consolidation. No pulmonary edema. No pleural effusion. No pneumothorax. No acute osseous abnormality. IMPRESSION: No active disease. Electronically Signed   By: Iven Finn M.D.   On: 06/30/2021 18:01   EEG adult  Result Date: 07/01/2021 Lora Havens, MD     07/01/2021 12:09 PM Patient Name: Baylie Strumpf MRN: [id number] Epilepsy Attending: Lora Havens Referring Physician/Provider: Dr Cordelia Poche Date: 07/01/2021 Duration: 24.18 mins Patient history: 55 year old male with a seizure in the setting of hyperglycemia.  EEG did not show seizures. Level of alertness: Awake, asleep AEDs during EEG study: Gabapentin Technical aspects: This EEG study was done with scalp electrodes positioned according to the 10-20 International system of electrode placement. Electrical activity was acquired at a sampling rate of '500Hz'$  and reviewed with a high frequency filter of '70Hz'$  and a low frequency filter of '1Hz'$ . EEG data were recorded continuously and digitally stored. Description: The posterior dominant rhythm consists of 9-10 Hz activity of moderate voltage (25-35 uV) seen predominantly in posterior head regions, symmetric  and reactive to eye opening and eye closing. Sleep was characterized by vertex waves, sleep spindles (12 to 14 Hz), maximal frontocentral region. Hyperventilation and photic stimulation were not performed.   IMPRESSION: This study is within normal limits. No seizures or epileptiform discharges were seen throughout the recording. Priyanka Barbra Sarks     Assessment and Plan:   Chronic combined systolic and diastolic CHF -With no significant volume overload on exam, normal BNP and no fluid on CXR, would hold off on Lasix at this time - Follow volume status daily and symptoms as well as BUN/creatinine - Restart Lasix as appropriate based on patient condition - His sister is aware that he has agreed to have a home health RN or visits by home paramedic to help him -He says he will take his medicines, weigh daily and decrease the salt  2.  Diabetes - Patient admits to extremely poor diet compliance - He also admits to poor medication compliance - Management per IM, but this is contributing to his multiple electrolyte abnormalities  3.  Hypertension: - He has been poorly compliant with medications. -He has been restarted on his home dose of carvedilol 12.5 mg twice daily and hydralazine 25 mg 3 times daily.  He is also on Imdur 30 mg daily - Increase hydralazine to 50 mg 3 times daily - Increase carvedilol to 18.75 mg twice daily  Risk Assessment/Risk Scores:        New York Heart Association (NYHA) Functional Class NYHA Class III        For questions or updates, please contact [facility] Please consult [email or web address].com for contact info under    Signed, Rosaria Ferries, PA-C  07/01/2021 4:32 PM

## 2021-07-01 NOTE — Progress Notes (Signed)
Pt arrived from ED, VSS, oriented to unit, CHG complete, Telebox [id number]. Neuro Intact. Will continue to monitor. Call light within reach.  Chrisandra Carota, RN 07/01/2021 6:12 PM

## 2021-07-01 NOTE — ED Notes (Addendum)
Dr. Roel Cluck paged about pt's potassium. See new orders.

## 2021-07-01 NOTE — ED Notes (Signed)
Pt remains asleep. NAD noted.

## 2021-07-01 NOTE — ED Notes (Signed)
Breakfast Ordered 

## 2021-07-01 NOTE — ED Notes (Signed)
Pt asleep. NAD noted.

## 2021-07-01 NOTE — ED Notes (Signed)
Ultrasound at bedside with patient. 

## 2021-07-01 NOTE — ED Notes (Signed)
I spoke with Dr. Roel Cluck over phone regarding pt's BG and per endotool. See new orders.

## 2021-07-01 NOTE — Progress Notes (Signed)
  Echocardiogram 2D Echocardiogram has been performed.  Jeffrey [name] 07/01/2021, 5:56 PM

## 2021-07-01 NOTE — ED Notes (Signed)
I woke pt up to take medications. Still oriented x4. Denies needs.

## 2021-07-02 ENCOUNTER — Other Ambulatory Visit (HOSPITAL_COMMUNITY): Payer: Self-pay

## 2021-07-02 ENCOUNTER — Inpatient Hospital Stay (HOSPITAL_COMMUNITY): Payer: Medicare Other

## 2021-07-02 LAB — BASIC METABOLIC PANEL
Anion gap: 5 (ref 5–15)
BUN: 19 mg/dL (ref 6–20)
CO2: 28 mmol/L (ref 22–32)
Calcium: 8.5 mg/dL — ABNORMAL LOW (ref 8.9–10.3)
Chloride: 105 mmol/L (ref 98–111)
Creatinine, Ser: 1.75 mg/dL — ABNORMAL HIGH (ref 0.61–1.24)
GFR, Estimated: 45 mL/min — ABNORMAL LOW (ref >60)
Glucose, Bld: 161 mg/dL — ABNORMAL HIGH (ref 70–99)
Potassium: 2.9 mmol/L — ABNORMAL LOW (ref 3.5–5.1)
Sodium: 138 mmol/L (ref 135–145)

## 2021-07-02 LAB — LIPID PANEL
Cholesterol: 201 mg/dL — ABNORMAL HIGH (ref 0–200)
HDL: 36 mg/dL — ABNORMAL LOW (ref >40)
LDL Cholesterol: 120 mg/dL — ABNORMAL HIGH (ref 0–99)
Total CHOL/HDL Ratio: 5.6 RATIO
Triglycerides: 225 mg/dL — ABNORMAL HIGH (ref <150)
VLDL: 45 mg/dL — ABNORMAL HIGH (ref 0–40)

## 2021-07-02 LAB — GLUCOSE, CAPILLARY
Glucose-Capillary: 105 mg/dL — ABNORMAL HIGH (ref 70–99)
Glucose-Capillary: 172 mg/dL — ABNORMAL HIGH (ref 70–99)
Glucose-Capillary: 188 mg/dL — ABNORMAL HIGH (ref 70–99)
Glucose-Capillary: 194 mg/dL — ABNORMAL HIGH (ref 70–99)
Glucose-Capillary: 234 mg/dL — ABNORMAL HIGH (ref 70–99)

## 2021-07-02 LAB — POTASSIUM: Potassium: 3.5 mmol/L (ref 3.5–5.1)

## 2021-07-02 MED ORDER — ASPIRIN 81 MG PO TBEC
81.0000 mg | DELAYED_RELEASE_TABLET | Freq: Every day | ORAL | 2 refills | Status: DC
  Filled 2021-07-02 – 2021-07-27 (×3): qty 30, 30d supply, fill #0

## 2021-07-02 MED ORDER — ACCU-CHEK GUIDE VI STRP
ORAL_STRIP | 12 refills | Status: DC
  Filled 2021-07-02 – 2021-08-10 (×3): qty 100, 30d supply, fill #0

## 2021-07-02 MED ORDER — BLOOD GLUCOSE METER KIT
PACK | 0 refills | Status: DC
  Filled 2021-07-02: qty 1, fill #0

## 2021-07-02 MED ORDER — POTASSIUM CHLORIDE CRYS ER 20 MEQ PO TBCR
40.0000 meq | EXTENDED_RELEASE_TABLET | ORAL | Status: AC
  Administered 2021-07-02 (×2): 40 meq via ORAL
  Filled 2021-07-02 (×2): qty 2

## 2021-07-02 MED ORDER — LANTUS SOLOSTAR 100 UNIT/ML ~~LOC~~ SOPN
40.0000 [IU] | PEN_INJECTOR | Freq: Every day | SUBCUTANEOUS | 2 refills | Status: DC
Start: 2021-07-02 — End: 2021-08-07
  Filled 2021-07-02 – 2021-07-27 (×3): qty 12, 30d supply, fill #0

## 2021-07-02 MED ORDER — ACCU-CHEK SOFTCLIX LANCETS MISC
5 refills | Status: DC
  Filled 2021-07-02 – 2021-07-08 (×2): qty 100, 30d supply, fill #0

## 2021-07-02 MED ORDER — CLOPIDOGREL BISULFATE 75 MG PO TABS
75.0000 mg | ORAL_TABLET | Freq: Every day | ORAL | 0 refills | Status: AC
  Filled 2021-07-02: qty 20, 20d supply, fill #0

## 2021-07-02 MED ORDER — ACCU-CHEK GUIDE W/DEVICE KIT
PACK | 0 refills | Status: DC
  Filled 2021-07-02: qty 1, 1d supply, fill #0

## 2021-07-02 NOTE — Progress Notes (Signed)
Physical Therapy Treatment Patient Details Name: Jeffrey [name] MRN: [id number] DOB: Mar 26, 1966 Today's Date: 07/02/2021    History of Present Illness Pt is 55 yo male who was a visitor in the ED when he had a seizure.  He was admitted on 06/30/21.  Pt admitted with seizure and hyperglycemia. Pt did have MRI that revealed Acute to subacute punctate infarctions within the pons and the left side of the splenium of the corpus callosum. Pt with hx of cardiomyopathy, DM2, HTN, HLD, CHF, CKD.    PT Comments    Pt progressing steadily toward goals.  Expect getting over 19/24 on DGI will be difficulty given pt's neuropathy and lower vision.  Emphasis on general transition, transfers, gait stability, stairs and negotiating parameters of the DGI.    Follow Up Recommendations  No PT follow up     Equipment Recommendations  None recommended by PT    Recommendations for Other Services       Precautions / Restrictions Precautions Precautions: Fall    Mobility  Bed Mobility Overal bed mobility: Modified Independent                  Transfers Overall transfer level: Needs assistance   Transfers: Sit to/from Stand Sit to Stand: Supervision            Ambulation/Gait Ambulation/Gait assistance: Supervision Gait Distance (Feet): 400 Feet Assistive device: None Gait Pattern/deviations: Step-through pattern Gait velocity: decreased Gait velocity interpretation: 1.31 - 2.62 ft/sec, indicative of limited community ambulator General Gait Details: periods of mild instability with scanning or changes in direction.  Likely due to neuropathy and low vision based on how pt responded to commands and activities.  Several deviations, but no overt LOB.   Stairs Stairs: Yes Stairs assistance: Supervision Stair Management: One rail Right;Alternating pattern Number of Stairs: 4 General stair comments: safe with a rail   Wheelchair Mobility    Modified Rankin (Stroke Patients  Only) Modified Rankin (Stroke Patients Only) Pre-Morbid Rankin Score: Slight disability Modified Rankin: Slight disability     Balance     Sitting balance-Leahy Scale: Good       Standing balance-Leahy Scale: Fair                   Standardized Balance Assessment Standardized Balance Assessment : Dynamic Gait Index   Dynamic Gait Index Level Surface: Normal Change in Gait Speed: Mild Impairment Gait with Horizontal Head Turns: Normal Gait with Vertical Head Turns: Normal Gait and Pivot Turn: Mild Impairment Step Over Obstacle: Mild Impairment Step Around Obstacles: Mild Impairment Steps: Mild Impairment Total Score: 19      Cognition Arousal/Alertness: Awake/alert Behavior During Therapy: Flat affect Overall Cognitive Status: Impaired/Different from baseline                   Orientation Level: Situation Current Attention Level: Selective     Safety/Judgement: Decreased awareness of safety Awareness: Emergent          Exercises      General Comments General comments (skin integrity, edema, etc.): pt still shows some instability and scored 19/24 on the DGI which is also predictive fall potential.      Pertinent Vitals/Pain Pain Assessment: Faces Faces Pain Scale: No hurt Pain Intervention(s): Monitored during session    Home Living                      Prior Function  PT Goals (current goals can now be found in the care plan section) Acute Rehab PT Goals Patient Stated Goal: return home PT Goal Formulation: With patient/family Time For Goal Achievement: 07/15/21 Potential to Achieve Goals: Good Progress towards PT goals: Progressing toward goals    Frequency    Min 4X/week      PT Plan Current plan remains appropriate    Co-evaluation              AM-PAC PT "6 Clicks" Mobility   Outcome Measure  Help needed turning from your back to your side while in a flat bed without using bedrails?:  None Help needed moving from lying on your back to sitting on the side of a flat bed without using bedrails?: None Help needed moving to and from a bed to a chair (including a wheelchair)?: None Help needed standing up from a chair using your arms (e.g., wheelchair or bedside chair)?: A Little Help needed to walk in hospital room?: A Little Help needed climbing 3-5 steps with a railing? : A Little 6 Click Score: 21    End of Session   Activity Tolerance: Patient tolerated treatment well Patient left: in bed;with call bell/phone within reach Nurse Communication: Mobility status PT Visit Diagnosis: Unsteadiness on feet (R26.81)     Time: [id number] PT Time Calculation (min) (ACUTE ONLY): 16 min  Charges:  $Gait Training: 8-22 mins                     07/02/2021  Ginger Carne., PT Acute Rehabilitation Services [phone]  (pager) [phone]  (office)   [name] 07/02/2021, 5:46 PM

## 2021-07-02 NOTE — Plan of Care (Signed)
Nutrition Education Note  RD consulted for nutrition education regarding uncontrolled type 2 diabetes.   Lab Results  Component Value Date   HGBA1C 14.2 (H) 06/30/2021   RD provided "Carbohydrate Counting for People with Diabetes" handout from the Academy of Nutrition and Dietetics. Discussed different food groups and their effects on blood sugar, emphasizing carbohydrate-containing foods. Provided list of carbohydrates and recommended serving sizes of common foods.  Discussed in depth sugar-sweetened beverages, such as sodas and fruit juices. Pt realized this is the cause of his increased A1c. He reports that he is prepared to change and limit these beverages.  Discussed importance of controlled and consistent carbohydrate intake throughout the day. Provided examples of ways to balance meals/snacks and encouraged intake of high-fiber, whole grain complex carbohydrates. Teach back method used.  Expect good compliance.  Body mass index is 32.89 kg/m. Pt meets criteria for Obesity Class 1 based on current BMI.  Current diet order is Carb Modified. Patient meals are undocumented in Epic at this time. Labs and medications reviewed.   No further nutrition interventions warranted at this time. RD contact information provided. If additional nutrition issues arise, please re-consult RD.  Derrel Nip, RD, LDN (she/her/hers) Registered Dietitian I After-Hours/Weekend Pager # in [place]

## 2021-07-02 NOTE — Progress Notes (Signed)
Progress Note  Patient Name: Jeffrey [name] Date of Encounter: 07/02/2021  [facility] Cardiologist: Mertie Moores, MD   Subjective   Feeling well.  Reports taking his cardiac medications at home.   Inpatient Medications    Scheduled Meds:  aspirin EC  81 mg Oral Daily   carvedilol  18.75 mg Oral BID WC   clopidogrel  75 mg Oral Daily   gabapentin  400 mg Oral TID   hydrALAZINE  50 mg Oral TID   insulin aspart  0-9 Units Subcutaneous Q4H   insulin glargine  40 Units Subcutaneous QHS   isosorbide mononitrate  30 mg Oral Daily   potassium chloride  40 mEq Oral Q4H   rosuvastatin  40 mg Oral Daily   Continuous Infusions:  PRN Meds: acetaminophen **OR** acetaminophen, dextrose, HYDROcodone-acetaminophen   Vital Signs    Vitals:   07/01/21 2300 07/02/21 0000 07/02/21 0406 07/02/21 0731  BP: (!) 142/92  (!) 142/88 140/89  Pulse: 74 70 69 69  Resp: (!) '26 19 16 12  '$ Temp: 98.2 F (36.8 C)  98.8 F (37.1 C) 98.9 F (37.2 C)  TempSrc: Oral  Oral Oral  SpO2: 97% 99% 97% 100%  Weight:      Height:        Intake/Output Summary (Last 24 hours) at 07/02/2021 0938 Last data filed at 07/02/2021 0740 Gross per 24 hour  Intake 1408.91 ml  Output 300 ml  Net 1108.91 ml   Last 3 Weights 06/30/2021  Weight (lbs) 210 lb  Weight (kg) 95.255 kg      Telemetry    Sinus rhythm.  No events.  - Personally Reviewed  ECG    N/a - Personally Reviewed  Physical Exam   VS:  BP 140/89 (BP Location: Right Arm)   Pulse 69   Temp 98.9 F (37.2 C) (Oral)   Resp 12   Ht '5\' 7"'$  (1.702 m)   Wt 95.3 kg   SpO2 100%   BMI 32.89 kg/m  , BMI Body mass index is 32.89 kg/m. GENERAL:  Well appearing HEENT: Pupils equal round and reactive, fundi not visualized, oral mucosa unremarkable NECK:  No jugular venous distention, waveform within normal limits, carotid upstroke brisk and symmetric, no bruits LUNGS:  Clear to auscultation bilaterally HEART:  RRR.  PMI not displaced or  sustained,S1 and S2 within normal limits, no S3, no S4, no clicks, no rubs, n murmurs ABD:  Flat, positive bowel sounds normal in frequency in pitch, no bruits, no rebound, no guarding, no midline pulsatile mass, no hepatomegaly, no splenomegaly EXT:  2 plus pulses throughout, no edema, no cyanosis no clubbing SKIN:  No rashes no nodules NEURO:  Cranial nerves II through XII grossly intact, motor grossly intact throughout PSYCH:  Cognitively intact, oriented to person place and time   Labs    High Sensitivity Troponin:   Recent Labs  Lab 06/30/21 1844 06/30/21 2044 06/30/21 2306 07/01/21 0233  TROPONINIHS 51* 54* 59* 59*      Chemistry Recent Labs  Lab 06/30/21 1625 06/30/21 1655 07/01/21 0233 07/01/21 0323 07/01/21 1656 07/02/21 0037  NA 132*   < > 139 137  --  138  K 2.7*   < > 2.5* 2.6* 2.9* 2.9*  CL 94*   < > 102 104  --  105  CO2 21*   < > 24 27  --  28  GLUCOSE 705*   < > 110* 86  --  161*  BUN 24*   < >  22* 20  --  19  CREATININE 2.24*   < > 1.58* 1.51*  --  1.75*  CALCIUM 9.0   < > 9.1 8.7*  --  8.5*  PROT 7.2  --  6.1*  --   --   --   ALBUMIN 3.4*  --  2.8*  --   --   --   AST 20  --  16  --   --   --   ALT 15  --  13  --   --   --   ALKPHOS 85  --  65  --   --   --   BILITOT 0.6  --  0.7  --   --   --   GFRNONAA 34*   < > 51* 54*  --  45*  ANIONGAP 17*   < > 13 6  --  5   < > = values in this interval not displayed.     Hematology Recent Labs  Lab 06/30/21 1625 06/30/21 1655 07/01/21 0323  WBC 7.3  --  5.6  RBC 4.92  --  4.51  HGB 12.1* 12.9*  13.3 11.3*  HCT 39.9 38.0*  39.0 36.0*  MCV 81.1  --  79.8*  MCH 24.6*  --  25.1*  MCHC 30.3  --  31.4  RDW 14.0  --  14.2  PLT 247  --  207    BNP Recent Labs  Lab 06/30/21 1935  BNP 30.6     DDimer No results for input(s): DDIMER in the last 168 hours.   Radiology    CT HEAD WO CONTRAST  Result Date: 06/30/2021 CLINICAL DATA:  Mental status change. Unknown cause. visitor in a recliner  and had an active tonic clonic seizure in the waiting room. Unclear per provider and patient if recent surgical history the orbit. EXAM: CT HEAD WITHOUT CONTRAST TECHNIQUE: Contiguous axial images were obtained from the base of the skull through the vertex without intravenous contrast. COMPARISON:  Head 06/12/2020 FINDINGS: Brain: Cerebral ventricle sizes are concordant with the degree of cerebral volume loss. Patchy and confluent areas of decreased attenuation are noted throughout the deep and periventricular white matter of the cerebral hemispheres bilaterally, compatible with chronic microvascular ischemic disease. No evidence of large-territorial acute infarction. No parenchymal hemorrhage. No mass lesion. No extra-axial collection. No mass effect or midline shift. No hydrocephalus. Basilar cisterns are patent. Vascular: No hyperdense vessel. Skull: No acute fracture or focal lesion. Sinuses/Orbits: Paranasal sinuses and mastoid air cells are clear. Interval development of a hyperdense right globe. The left orbit is unremarkable. Other: None. IMPRESSION: 1. Interval development of a hyperdense right globe. Finding may be related to hematoma versus postsurgical changes. Recommend correlation with physical exam and clinical history. If no recent surgical procedure in the last year, recommend emergent ophthalmological consultation. 2.  Otherwise no acute intracranial abnormality. These results were called by telephone at the time of interpretation on 06/30/2021 at 6:01 pm to provider MARGAUX VENTER , who verbally acknowledged these results. Electronically Signed   By: Iven Finn M.D.   On: 06/30/2021 18:04   MR BRAIN WO CONTRAST  Result Date: 07/01/2021 CLINICAL DATA:  Seizure like activity yesterday. Mental status changes. EXAM: MRI HEAD WITHOUT CONTRAST TECHNIQUE: Multiplanar, multiecho pulse sequences of the brain and surrounding structures were obtained without intravenous contrast. COMPARISON:  Head CT  yesterday. FINDINGS: Brain: Diffusion imaging shows a few punctate foci of acute infarction within the pons. There is a  small region of acute to subacute infarction at the left splenium of corpus callosum. Punctate focus of acute infarction in the right frontal subcortical white matter. No confluent large infarction. There are mild moderate chronic small-vessel ischemic changes of the cerebral hemispheric white matter. Old small vessel infarction in the external capsule on the right, in the left basal ganglia and in the medial right thalamus. No large vessel territory stroke. No mass, hemorrhage, hydrocephalus or extra-axial collection. Mesial temporal lobes normal. Vascular: Major vessels at the base of the brain show flow. Skull and upper cervical spine: Negative Sinuses/Orbits: Clear/normal Other: None IMPRESSION: Background pattern of chronic small vessel ischemic changes of the white matter with old small vessel infarctions in the right external capsule, both basal ganglia and the right thalamus. Acute to subacute punctate infarctions within the pons and the left side of the splenium of the corpus callosum. No evidence of mass effect or hemorrhage. Electronically Signed   By: Nelson Chimes M.D.   On: 07/01/2021 15:07   DG Chest Port 1 View  Result Date: 06/30/2021 CLINICAL DATA:  CP, SOB, coughing Pacemaker placed but pt unsure of what year Hx of CH EXAM: PORTABLE CHEST 1 VIEW COMPARISON:  CT chest 12/03/2019, chest x-ray 06/12/2020 FINDINGS: The heart size and mediastinal contours are unchanged. Left chest wall single lead cardiac device in stable position. Persistent left base atelectasis with minimally elevated left hemidiaphragm. No focal consolidation. No pulmonary edema. No pleural effusion. No pneumothorax. No acute osseous abnormality. IMPRESSION: No active disease. Electronically Signed   By: Iven Finn M.D.   On: 06/30/2021 18:01   EEG adult  Result Date: 07/01/2021 Lora Havens, MD      07/01/2021 12:09 PM Patient Name: Joren Fargnoli MRN: [id number] Epilepsy Attending: Lora Havens Referring Physician/Provider: Dr Cordelia Poche Date: 07/01/2021 Duration: 24.18 mins Patient history: 56 year old male with a seizure in the setting of hyperglycemia.  EEG did not show seizures. Level of alertness: Awake, asleep AEDs during EEG study: Gabapentin Technical aspects: This EEG study was done with scalp electrodes positioned according to the 10-20 International system of electrode placement. Electrical activity was acquired at a sampling rate of '500Hz'$  and reviewed with a high frequency filter of '70Hz'$  and a low frequency filter of '1Hz'$ . EEG data were recorded continuously and digitally stored. Description: The posterior dominant rhythm consists of 9-10 Hz activity of moderate voltage (25-35 uV) seen predominantly in posterior head regions, symmetric and reactive to eye opening and eye closing. Sleep was characterized by vertex waves, sleep spindles (12 to 14 Hz), maximal frontocentral region. Hyperventilation and photic stimulation were not performed.   IMPRESSION: This study is within normal limits. No seizures or epileptiform discharges were seen throughout the recording. Lora Havens   ECHOCARDIOGRAM COMPLETE  Result Date: 07/01/2021    ECHOCARDIOGRAM REPORT   Patient Name:   JARQUIS REICHL Date of Exam: 07/01/2021 Medical Rec #:  [id number]       Height:       67.0 in Accession #:    [id number]      Weight:       210.0 lb Date of Birth:  September 04, 1966       BSA:          2.065 m Patient Age:    55 years        BP:           160/106 mmHg Patient Gender: M  HR:           74 bpm. Exam Location:  Inpatient Procedure: 2D Echo Indications:    stroke  History:        Patient has no prior history of Echocardiogram examinations.                 CHF, CAD, chronic kidney disease; Risk Factors:Hypertension and                 Diabetes.  Sonographer:    Johny Chess Referring Phys: [place]  1. Left ventricular ejection fraction, by estimation, is 30 to 35%. The left ventricle has moderately decreased function. The left ventricle demonstrates global hypokinesis. Left ventricular diastolic parameters are consistent with Grade I diastolic dysfunction (impaired relaxation).  2. Right ventricular systolic function is normal. The right ventricular size is normal. There is normal pulmonary artery systolic pressure.  3. The mitral valve is grossly normal. No evidence of mitral valve regurgitation. No evidence of mitral stenosis.  4. The aortic valve was not well visualized. There is mild thickening of the aortic valve. Aortic valve regurgitation is not visualized. Mild aortic valve sclerosis is present, with no evidence of aortic valve stenosis.  5. The inferior vena cava is normal in size with greater than 50% respiratory variability, suggesting right atrial pressure of 3 mmHg. Comparison(s): No prior Echocardiogram. FINDINGS  Left Ventricle: Left ventricular ejection fraction, by estimation, is 30 to 35%. The left ventricle has moderately decreased function. The left ventricle demonstrates global hypokinesis. The left ventricular internal cavity size was normal in size. There is no left ventricular hypertrophy. Left ventricular diastolic parameters are consistent with Grade I diastolic dysfunction (impaired relaxation). Right Ventricle: The right ventricular size is normal. No increase in right ventricular wall thickness. Right ventricular systolic function is normal. There is normal pulmonary artery systolic pressure. The tricuspid regurgitant velocity is 2.13 m/s, and  with an assumed right atrial pressure of 3 mmHg, the estimated right ventricular systolic pressure is 123456 mmHg. Left Atrium: Left atrial size was normal in size. Right Atrium: Right atrial size was normal in size. Pericardium: There is no evidence of pericardial effusion. Mitral Valve: The mitral valve is grossly normal.  No evidence of mitral valve regurgitation. No evidence of mitral valve stenosis. Tricuspid Valve: The tricuspid valve is normal in structure. Tricuspid valve regurgitation is not demonstrated. No evidence of tricuspid stenosis. Aortic Valve: The aortic valve was not well visualized. There is mild thickening of the aortic valve. Aortic valve regurgitation is not visualized. Mild aortic valve sclerosis is present, with no evidence of aortic valve stenosis. Pulmonic Valve: The pulmonic valve was not well visualized. Pulmonic valve regurgitation is not visualized. No evidence of pulmonic stenosis. Aorta: The aortic root and ascending aorta are structurally normal, with no evidence of dilitation. Venous: The inferior vena cava is normal in size with greater than 50% respiratory variability, suggesting right atrial pressure of 3 mmHg. IAS/Shunts: The atrial septum is grossly normal. Additional Comments: A device lead is visualized.  LEFT VENTRICLE PLAX 2D LVIDd:         4.80 cm      Diastology LVIDs:         3.90 cm      LV e' medial:    4.57 cm/s LV PW:         1.00 cm      LV E/e' medial:  22.1 LV IVS:  1.00 cm      LV e' lateral:   7.07 cm/s LVOT diam:     2.20 cm      LV E/e' lateral: 14.3 LV SV:         68 LV SV Index:   33 LVOT Area:     3.80 cm  LV Volumes (MOD) LV vol d, MOD A2C: 136.0 ml LV vol d, MOD A4C: 147.0 ml LV vol s, MOD A2C: 87.3 ml LV vol s, MOD A4C: 90.2 ml LV SV MOD A2C:     48.7 ml LV SV MOD A4C:     147.0 ml LV SV MOD BP:      52.6 ml RIGHT VENTRICLE             IVC RV S prime:     12.40 cm/s  IVC diam: 1.80 cm TAPSE (M-mode): 1.9 cm LEFT ATRIUM             Index       RIGHT ATRIUM           Index LA diam:        3.40 cm 1.65 cm/m  RA Area:     11.90 cm LA Vol (A2C):   65.5 ml 31.73 ml/m RA Volume:   25.80 ml  12.50 ml/m LA Vol (A4C):   59.8 ml 28.96 ml/m LA Biplane Vol: 67.3 ml 32.60 ml/m  AORTIC VALVE LVOT Vmax:   90.80 cm/s LVOT Vmean:  58.500 cm/s LVOT VTI:    0.179 m  AORTA Ao Root  diam: 3.50 cm Ao Asc diam:  3.30 cm MITRAL VALVE                TRICUSPID VALVE MV Area (PHT): 4.89 cm     TR Peak grad:   18.1 mmHg MV Decel Time: 155 msec     TR Vmax:        213.00 cm/s MV E velocity: 101.00 cm/s MV A velocity: 125.00 cm/s  SHUNTS MV E/A ratio:  0.81         Systemic VTI:  0.18 m                             Systemic Diam: 2.20 cm Rudean Haskell MD Electronically signed by Rudean Haskell MD Signature Date/Time: 07/01/2021/6:51:33 PM    Final     Cardiac Studies   Echo 07/01/21: IMPRESSIONS     1. Left ventricular ejection fraction, by estimation, is 30 to 35%. The  left ventricle has moderately decreased function. The left ventricle  demonstrates global hypokinesis. Left ventricular diastolic parameters are  consistent with Grade I diastolic  dysfunction (impaired relaxation).   2. Right ventricular systolic function is normal. The right ventricular  size is normal. There is normal pulmonary artery systolic pressure.   3. The mitral valve is grossly normal. No evidence of mitral valve  regurgitation. No evidence of mitral stenosis.   4. The aortic valve was not well visualized. There is mild thickening of  the aortic valve. Aortic valve regurgitation is not visualized. Mild  aortic valve sclerosis is present, with no evidence of aortic valve  stenosis.   5. The inferior vena cava is normal in size with greater than 50%  respiratory variability, suggesting right atrial pressure of 3 mmHg.   Patient Profile     Mr. Lougheed is a 39M with chronic systolic and diastolic heart failure (LVEF 10-20% improved to  30-35%), s/p ICD, diabetes, CKD III and non-adherence admitted with seizure.  Assessment & Plan    # Seizure:  In the setting of hyperglycermia and electrolyte abnormalities.  Per Neurology.   # Chronic systolic and diastolic heart failure:  # Essential hypertension:  Mr. Sanghera is a 67M with chronic systolic and diastolic heart failure (LVEF 10-20%  improved to 30-35%), s/p ICD, diabetes, CKD III and non-adherence admitted with seizure. BP remains poorly controlled.  Increase carvedilol to '25mg'$ .  Hydralazine was increased to '50mg'$  today.  Continue Imdur.  WIll hold on starting Entresto or spironolactone given his acute on chronic renal failure.  Consider as an outpatient.  No SGLT2 inhibitor for now until DM better controlled.   # CAD:  Lipids poorly controlled.  Suspect he wasn't taking rosuvastatin though he reports adherence.  Would repeat lipids/CMP in 2-3 months.  Stressed the importance of taking mediation as prescribed.  Continue aspirin, clopidogrel, carvedilol, Imdur, and rosuvastatin.    [facility] will sign off.   Medication Recommendations:  increase carvedilol  Other recommendations (labs, testing, etc):  try to add Entresto, spironolactone, SGLT2 inhibitor as outpatient Follow up as an outpatient:  we will arrange  For questions or updates, please contact [facility] Please consult [email or web address] for contact info under        Signed, Skeet Latch, MD  07/02/2021, 9:38 AM

## 2021-07-02 NOTE — Progress Notes (Addendum)
Inpatient Diabetes Program Recommendations  AACE/ADA: New Consensus Statement on Inpatient Glycemic Control (2015)  Target Ranges:  Prepandial:   less than 140 mg/dL      Peak postprandial:   less than 180 mg/dL (1-2 hours)      Critically ill patients:  140 - 180 mg/dL   Lab Results  Component Value Date   GLUCAP 105 (H) 07/02/2021   HGBA1C 14.2 (H) 06/30/2021    Review of Glycemic Control Results for Jeffrey [name], Jeffrey [name] (MRN [id number]) as of 07/02/2021 11:37  Ref. Range 07/01/2021 18:07 07/01/2021 20:10 07/02/2021 00:01 07/02/2021 04:10 07/02/2021 07:33  Glucose-Capillary Latest Ref Range: 70 - 99 mg/dL 193 (H) 192 (H) 194 (H) 172 (H) 105 (H)   Diabetes history: DM2 Outpatient Diabetes medications:  Current orders for Inpatient glycemic control: Lantus 40 units, Novolog 0-9 units Q4H    Inpatient Diabetes Program Recommendations:    Please consider Novolog 0-9 units TID and 0-5 QHS  Will continue to follow while inpatient.  Thank you, Reche Dixon, RN, BSN Diabetes Coordinator Inpatient Diabetes Program [phone] (team pager from 8a-5p)

## 2021-07-02 NOTE — Discharge Instructions (Addendum)
Jeffrey [name],  You are in the hospital for multiple reasons:  Seizure: This is secondary to hyperglycemia (high blood sugar).  You are observed with no more seizures noted.  Please ensure that you keep your blood sugar well controlled.  Hyperglycemia: You were initially treated with IV insulin and have been transitioned to injectable insulin under the skin.  Please continue your insulin as recommended.  I have decreased your insulin dose with the assumption that you will adhere to a lower carb diet as we discussed.  Stroke: This was found incidentally (by accident).  He had a work-up performed while in the hospital with recommendations from the neurologist to start on aspirin and Plavix.  You should only take the Plavix for a total of 21 days including the day you took Plavix in the hospital.  Please follow-up with your primary care physician and the neurologist as an outpatient as recommended.  Cordelia Poche, MD

## 2021-07-02 NOTE — Progress Notes (Addendum)
STROKE TEAM PROGRESS NOTE   INTERVAL HISTORY Admitted last 06/30/21 after having a having a tonic clonic seizure in the ED. in the setting of significantly elevated blood glucose of greater than 700 and this is apparently happened in the past as well. MRA head and neck this morning were unremarkable.  MRI scan of the brain yesterday showed tiny right lateral thalamus, basal ganglia bilateral and pontine lacunar infarcts which are likely clinically silent. CBGs have improved from admission.  Potassium remains low this morning at 2.9 EEG shows no seizure activity CBC:  Recent Labs  Lab 06/30/21 1625 06/30/21 1655 07/01/21 0323  WBC 7.3  --  5.6  NEUTROABS 3.8  --  3.5  HGB 12.1* 12.9*  13.3 11.3*  HCT 39.9 38.0*  39.0 36.0*  MCV 81.1  --  79.8*  PLT 247  --  [id number]   Basic Metabolic Panel:  Recent Labs  Lab 06/30/21 2044 06/30/21 2306 07/01/21 0233 07/01/21 0323 07/01/21 1656 07/02/21 0037  NA  --    < > 139 137  --  138  K  --    < > 2.5* 2.6* 2.9* 2.9*  CL  --    < > 102 104  --  105  CO2  --    < > 24 27  --  28  GLUCOSE  --    < > 110* 86  --  161*  BUN  --    < > 22* 20  --  19  CREATININE  --    < > 1.58* 1.51*  --  1.75*  CALCIUM  --    < > 9.1 8.7*  --  8.5*  MG 1.4*  --  1.7  --   --   --   PHOS 2.7  --  3.0  --   --   --    < > = values in this interval not displayed.   Lipid Panel:  Recent Labs  Lab 07/02/21 0037  CHOL 201*  TRIG 225*  HDL 36*  CHOLHDL 5.6  VLDL 45*  LDLCALC 120*   HgbA1c:  Recent Labs  Lab 06/30/21 2007  HGBA1C 14.2*   Urine Drug Screen:  Recent Labs  Lab 06/30/21 1735  LABOPIA NONE DETECTED  COCAINSCRNUR NONE DETECTED  LABBENZ NONE DETECTED  AMPHETMU NONE DETECTED  THCU NONE DETECTED  LABBARB NONE DETECTED    Alcohol Level  Recent Labs  Lab 06/30/21 1625  ETH <10    CT-scan of the brain 06/30/2021: 1. Interval development of a hyperdense right globe. Finding may be related to hematoma versus postsurgical changes.  Recommend correlation with physical exam and clinical history. If no recent surgical procedure in the last year, recommend emergent ophthalmological consultation. 2.  Otherwise no acute intracranial abnormality.   MRI examination of the brain 06/30/2021: Background pattern of chronic small vessel ischemic changes of the white matter with old small vessel infarctions in the right external capsule, both basal ganglia and the right thalamus. Acute to subacute punctate infarctions within the pons and the left side of the splenium of the corpus callosum. No evidence of mass effect or hemorrhage.  EEG 07/01/21:  no seizures  MRA head and neck 07/02/21 No significant stenosis or occlusion  PHYSICAL EXAM Obese middle-aged African-American male not in distress. . Afebrile. Head is nontraumatic. Neck is supple without bruit.    Cardiac exam no murmur or gallop. Lungs are clear to auscultation. Distal pulses are well felt.  Neurological Exam ;  Awake  Alert oriented x 3.  Diminished attention, registration and recall.  Normal speech and language.eye movements full without nystagmus.fundi were not visualized. Vision acuity and fields appear normal. Hearing is normal. Palatal movements are normal. Face symmetric. Tongue midline. Normal strength, tone, reflexes and coordination. Normal sensation. Gait deferred.   ASSESSMENT/PLAN Jeffrey [name] is a 55 y.o. male with hypertension, CAD, NICM with reduced EF, prior MI, uncontrolled diabetes with history of seizures in the setting of hyperglycemia, and history of stroke who presented with a witnessed tonic clonic seizure while in the ED waiting room. Brain MRI, which was obtained for further evaluation CT findings and seizure, incidentally noted acute to subacute punctuate infarcts within the pons and left splenium of the corpus collosum.  Punctuate subcortical infarcts involving the pons and corpus collusum He presents with multiple risk factors including  uncontrolled diabetes, hypertension, hyperlipidemia, vascular disease, and prior strokes. Blood pressure on admission 201/104 CT-scan of the brain 06/30/2021: Interval development of a hyperdense right globe. Finding may be related to hematoma versus postsurgical changes.  MRI brain: acute to subacute punctuate infarcts within the pons and left side of the splenium of corpus callosum MRA head and neck: no significant stenosis or occlusions 2D Echo: no thrombus.  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 LDL 120 NICM with reduced EF and diastolic dysfunction s/p ICD placement Repeat echocardiogram this admission actually showed an improved in EF from 10-20% to 30-35%.  Coreg and hydralazine increased today Continue imdur and crestor Management per primary team Blood pressure goal is normotensive LDL goal <70   Hospital day # [place], MD Internal Medicine Resident PGY-3 [facility] Pager: [phone] 07/02/2021 3:38 PM    Stroke MD note: I have personally obtained history,examined this patient, reviewed notes, independently viewed imaging studies, participated in medical decision making and plan of care.ROS completed by me personally and pertinent positives fully documented  I have made any additions or clarifications directly to the above note. Agree with note above.  Patient presented with with altered mental status in the setting of significant hyperglycemia and seizure-like events.  Neurological exam is nonfocal.  MRI scan shows tiny  punctate clinically silent bilateral basal ganglia, right thalamic and pontine lacunar infarcts likely due to intracellular dehydration from severe hyperglycemia.  Recommend aspirin Plavix for 3 weeks followed by aspirin alone and aggressive risk factor modification.  Patient counseled to be compliant with his diabetic medications.  I do not think he needs long-term anticonvulsants in the setting of normal EEG and significant provocation from elevated hyperglycemia.  If he has unprovoked seizures in the future may need anticonvulsants at that time.  Discussed with patient and Dr. Lonny Prude.  Greater than 50% time during this 35-minute visit was spent on counseling and coordination of care about a silent strokes and provoked seizures in setting of severe hyperglycemia and answered questions. Stroke team will sign off.  Kindly call for questions Antony Contras, MD Medical Director [place] Pager: [phone] 07/02/2021 4:03 PM  To contact Stroke Continuity provider, please refer to [email or web address] After hours,  contact General Neurology

## 2021-07-04 NOTE — Discharge Summary (Signed)
Physician Discharge Summary  Jeffrey [name] CZY:[id number] DOB: 1966/04/21 DOA: 06/30/2021  PCP: Charlott Rakes, MD  Admit date: 06/30/2021 Discharge date: 07/04/2021  Admitted From: Home Disposition: Home  Recommendations for Outpatient Follow-up:  Follow up with PCP in 1 week Follow up with neurology Please follow up on the following pending results: None  Home Health: None Equipment/Devices: None  Discharge Condition: Stable CODE STATUS: Full code Diet recommendation: Heart healthy   Brief/Interim Summary:  Admission HPI written by Toy Baker, MD   HPI: Jeffrey [name] is a 55 y.o. male with medical history significant of   Nonischemic cardiomyopathy status post ICD, type 2 diabetes, HTN, HLD, systolic CHF, CKD stage IIIa   Presented with   seizure while in the waiting room of ER visiting family Unsure when was the last time he took his insulin Significant other reported that she has not been able him to get to take his medications. He takes his insulin sometimes but not often usually refuses Has  NOt been vaccinated against [facility] course:  Seizure Tonic-clonic. Unsure of duration. Likely secondary to extremely high blood sugar. Possible this is actually the second time this may have happened per discussion with wife. EEG without seizure activity noted. No AED started. Patient given driving precautions on discharge.   Hyperosmolar hyperglycemic state Secondary to medication non-adherence and likely dehydration. Patient managed with IV fluids and IV insulin with significant improvement of blood sugar. No anion gap acidosis. Patient transitioned to Lantus and SSI.   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 Neurology consulted. MRA head/neck with no significant stenosis or occlusions. Transthoracic Echocardiogram significant for EF of 30-35% with grade 1 diastolic  dysfunction. Recommendation for DAPT on discharge; Plavix 75 mg for a total of 21 days and aspirin 81 mg daily indefinitely.   Diabetes mellitus, type 2 Neuropathy Continue Lantus on discharge. Discharge on Lantus 40 units.   AKI on CKD 3a [name] baseline is around 1.3-1.5. Back to baseline with IV fluids.   Chronic combined systolic and diastolic heart failure Nonischemic cardiomyopathy CAD Transthoracic Echocardiogram from 03/2021 significant for an EF of 30-35% with grade 2 diastolic dysfunction. Fluid balance improved. Patient is s/p AICD recently placed. He is on Lasix 40 mg daily as an outpatient in addition to Coreg. Not on an ACEi or ARB presumably secondary to kidney disease, but unsure. Continue Coreg and Lasix on discharge.   Primary hypertension Patient is on hydralazine, Coreg and Imdur as an outpatient. Continue on discharge.   Anemia Mild and stable.   Demand ischemia Flat trend in setting of kidney disease and seizure. No chest pain.  Discharge Diagnoses:  Principal Problem:   Seizure ([place]) Active Problems:   Hyperglycemia   Prolonged QT interval   Acute on chronic renal failure (HCC)   Hypokalemia   Hyperosmolar hyperglycemic state (HHS) (HCC)   Chronic systolic CHF (congestive heart failure) (HCC)   Essential hypertension   Uncontrolled type 2 DM with hyperosmolar nonketotic hyperglycemia (HCC)   Anemia due to chronic kidney disease   CKD (chronic kidney disease), stage III (HCC)   CAD (coronary artery disease)   Fall due to seizure ([place])   Elevated troponin   Acute stroke due to ischemia [facility])    Discharge Instructions   Allergies as of 07/02/2021   No Known Allergies      Medication List     TAKE these medications  Accu-Chek Guide test strip Generic drug: glucose blood Use as instructed up to 4 times daily   Accu-Chek Guide w/Device Kit Use as directed   Accu-Chek Softclix Lancets lancets Use as directed up to 4 times daily    Aspirin Low Dose 81 MG EC tablet Generic drug: aspirin Take 1 tablet (81 mg total) by mouth daily. Swallow whole.   carvedilol 12.5 MG tablet Commonly known as: COREG Take 12.5 mg by mouth 2 (two) times daily with a meal.   clopidogrel 75 MG tablet Commonly known as: PLAVIX Take 1 tablet (75 mg total) by mouth daily for 20 days.   furosemide 40 MG tablet Commonly known as: LASIX Take 40 mg by mouth daily.   gabapentin 400 MG capsule Commonly known as: NEURONTIN Take 400 mg by mouth 3 (three) times daily.   hydrALAZINE 25 MG tablet Commonly known as: APRESOLINE Take 25 mg by mouth 3 (three) times daily.   isosorbide mononitrate 30 MG 24 hr tablet Commonly known as: IMDUR Take 30 mg by mouth daily.   Lantus SoloStar 100 UNIT/ML Solostar Pen Generic drug: insulin glargine Inject 40 Units into the skin at bedtime. What changed:  how much to take when to take this   NovoLOG 100 UNIT/ML injection Generic drug: insulin aspart Inject 0-15 Units into the skin 3 (three) times daily with meals. Per sliding scale   rosuvastatin 40 MG tablet Commonly known as: CRESTOR Take 40 mg by mouth daily.        Follow-up Information     Charlott Rakes, MD. Schedule an appointment as soon as possible for a visit in 1 week(s).   Specialty: Family Medicine Why: For hospital follow-up Contact information: [place] [address] [phone]         Garvin Fila, MD. Schedule an appointment as soon as possible for a visit in 4 week(s).   Specialties: Neurology, Radiology Why: For hospital follow-up of stroke Contact information: [address] [place] [address] [phone]                No Known Allergies  Consultations: Neurology Cardiology   Procedures/Studies: CT HEAD WO CONTRAST  Result Date: 06/30/2021 CLINICAL DATA:  Mental status change. Unknown cause. visitor in a recliner and had an active tonic clonic  seizure in the waiting room. Unclear per provider and patient if recent surgical history the orbit. EXAM: CT HEAD WITHOUT CONTRAST TECHNIQUE: Contiguous axial images were obtained from the base of the skull through the vertex without intravenous contrast. COMPARISON:  Head 06/12/2020 FINDINGS: Brain: Cerebral ventricle sizes are concordant with the degree of cerebral volume loss. Patchy and confluent areas of decreased attenuation are noted throughout the deep and periventricular white matter of the cerebral hemispheres bilaterally, compatible with chronic microvascular ischemic disease. No evidence of large-territorial acute infarction. No parenchymal hemorrhage. No mass lesion. No extra-axial collection. No mass effect or midline shift. No hydrocephalus. Basilar cisterns are patent. Vascular: No hyperdense vessel. Skull: No acute fracture or focal lesion. Sinuses/Orbits: Paranasal sinuses and mastoid air cells are clear. Interval development of a hyperdense right globe. The left orbit is unremarkable. Other: None. IMPRESSION: 1. Interval development of a hyperdense right globe. Finding may be related to hematoma versus postsurgical changes. Recommend correlation with physical exam and clinical history. If no recent surgical procedure in the last year, recommend emergent ophthalmological consultation. 2.  Otherwise no acute intracranial abnormality. These results were called by telephone at the time of  interpretation on 06/30/2021 at 6:01 pm to provider MARGAUX VENTER , who verbally acknowledged these results. Electronically Signed   By: Iven Finn M.D.   On: 06/30/2021 18:04   MR ANGIO HEAD WO CONTRAST  Result Date: 07/02/2021 CLINICAL DATA:  Stroke follow-up. EXAM: MRA HEAD WITHOUT CONTRAST TECHNIQUE: Angiographic images of the Circle of Willis were acquired using MRA technique without intravenous contrast. COMPARISON:  Head and neck CTA 10/16/2018 FINDINGS: Anterior circulation: The internal carotid  arteries are widely patent from skull base to carotid termini. ACAs and MCAs are patent without evidence of a proximal branch occlusion or significant proximal stenosis. No aneurysm is identified. Posterior circulation: Intracranial vertebral arteries are widely patent to the basilar and codominant. Patent AICA and SCA origins are seen bilaterally. The basilar artery is widely patent. Posterior communicating arteries are not clearly identified and may be diminutive or absent. The PCAs are patent without evidence of a significant proximal stenosis. No aneurysm is identified. Anatomic variants: None. IMPRESSION: Negative head MRA. Electronically Signed   By: Logan Bores M.D.   On: 07/02/2021 14:15   MR ANGIO NECK WO CONTRAST  Result Date: 07/02/2021 CLINICAL DATA:  Stroke follow-up. EXAM: MRA NECK WITHOUT CONTRAST TECHNIQUE: Angiographic images of the neck were acquired using MRA technique without intravenous contrast. Carotid stenosis measurements (when applicable) are obtained utilizing NASCET criteria, using the distal internal carotid diameter as the denominator. COMPARISON:  Head and neck CTA 10/16/2018 FINDINGS: Image quality is degraded by motion artifact (moderate through the lower neck and upper chest) and noncontrast technique. Aortic arch: Not included on this noncontrast study. Right carotid system: Patent. Artifact limits assessment of the proximal and mid portions of the common carotid artery. There is no evidence of a significant stenosis or dissection in the distal common carotid artery or cervical internal carotid artery. Left carotid system: Patent. Artifact limits assessment of the proximal and mid portions of the common carotid artery. There is no evidence of a significant stenosis or dissection in the distal common carotid artery or cervical internal carotid artery. Vertebral arteries: The proximal aspects of both V1 segments were not imaged, and motion artifact limits assessment of mid to  distal V1 segments including the region of the left V1 stenosis described on the prior CTA. The V2, V3, and V4 segments are patent with antegrade flow bilaterally without evidence of a significant stenosis or dissection. IMPRESSION: 1. Study limitations as detailed above primarily affecting assessment of the proximal portions of the common carotid and vertebral arteries. 2. No evidence of significant cervical carotid or vertebral artery stenosis or dissection where the vessels were adequately visualized. Electronically Signed   By: Logan Bores M.D.   On: 07/02/2021 15:04   MR BRAIN WO CONTRAST  Result Date: 07/01/2021 CLINICAL DATA:  Seizure like activity yesterday. Mental status changes. EXAM: MRI HEAD WITHOUT CONTRAST TECHNIQUE: Multiplanar, multiecho pulse sequences of the brain and surrounding structures were obtained without intravenous contrast. COMPARISON:  Head CT yesterday. FINDINGS: Brain: Diffusion imaging shows a few punctate foci of acute infarction within the pons. There is a small region of acute to subacute infarction at the left splenium of corpus callosum. Punctate focus of acute infarction in the right frontal subcortical white matter. No confluent large infarction. There are mild moderate chronic small-vessel ischemic changes of the cerebral hemispheric white matter. Old small vessel infarction in the external capsule on the right, in the left basal ganglia and in the medial right thalamus. No large vessel territory stroke. No  mass, hemorrhage, hydrocephalus or extra-axial collection. Mesial temporal lobes normal. Vascular: Major vessels at the base of the brain show flow. Skull and upper cervical spine: Negative Sinuses/Orbits: Clear/normal Other: None IMPRESSION: Background pattern of chronic small vessel ischemic changes of the white matter with old small vessel infarctions in the right external capsule, both basal ganglia and the right thalamus. Acute to subacute punctate infarctions  within the pons and the left side of the splenium of the corpus callosum. No evidence of mass effect or hemorrhage. Electronically Signed   By: Nelson Chimes M.D.   On: 07/01/2021 15:07   DG Chest Port 1 View  Result Date: 06/30/2021 CLINICAL DATA:  CP, SOB, coughing Pacemaker placed but pt unsure of what year Hx of CH EXAM: PORTABLE CHEST 1 VIEW COMPARISON:  CT chest 12/03/2019, chest x-ray 06/12/2020 FINDINGS: The heart size and mediastinal contours are unchanged. Left chest wall single lead cardiac device in stable position. Persistent left base atelectasis with minimally elevated left hemidiaphragm. No focal consolidation. No pulmonary edema. No pleural effusion. No pneumothorax. No acute osseous abnormality. IMPRESSION: No active disease. Electronically Signed   By: Iven Finn M.D.   On: 06/30/2021 18:01   EEG adult  Result Date: 07/01/2021 Lora Havens, MD     07/01/2021 12:09 PM Patient Name: Jeffrey [name] MRN: [id number] Epilepsy Attending: Lora Havens Referring Physician/Provider: Dr Cordelia Poche Date: 07/01/2021 Duration: 24.18 mins Patient history: 55 year old male with a seizure in the setting of hyperglycemia.  EEG did not show seizures. Level of alertness: Awake, asleep AEDs during EEG study: Gabapentin Technical aspects: This EEG study was done with scalp electrodes positioned according to the 10-20 International system of electrode placement. Electrical activity was acquired at a sampling rate of _0  and reviewed with a high frequency filter of _1  and a low frequency filter of _2 . EEG data were recorded continuously and digitally stored. Description: The posterior dominant rhythm consists of 9-10 Hz activity of moderate voltage (25-35 uV) seen predominantly in posterior head regions, symmetric and reactive to eye opening and eye closing. Sleep was characterized by vertex waves, sleep spindles (12 to 14 Hz), maximal frontocentral region. Hyperventilation and photic  stimulation were not performed.   IMPRESSION: This study is within normal limits. No seizures or epileptiform discharges were seen throughout the recording. Lora Havens   ECHOCARDIOGRAM COMPLETE  Result Date: 07/01/2021    ECHOCARDIOGRAM REPORT   Patient Name:   Jeffrey [name] Date of Exam: 07/01/2021 Medical Rec #:  [id number]       Height:       67.0 in Accession #:    [id number]      Weight:       210.0 lb Date of Birth:  1966-07-22       BSA:          2.065 m Patient Age:    76 years        BP:           160/106 mmHg Patient Gender: M               HR:           74 bpm. Exam Location:  Inpatient Procedure: 2D Echo Indications:    stroke  History:        Patient has no prior history of Echocardiogram examinations.                 CHF, CAD, chronic kidney disease; Risk Factors:Hypertension and  Diabetes.  Sonographer:    Johny Chess Referring Phys: [place]  1. Left ventricular ejection fraction, by estimation, is 30 to 35%. The left ventricle has moderately decreased function. The left ventricle demonstrates global hypokinesis. Left ventricular diastolic parameters are consistent with Grade I diastolic dysfunction (impaired relaxation).  2. Right ventricular systolic function is normal. The right ventricular size is normal. There is normal pulmonary artery systolic pressure.  3. The mitral valve is grossly normal. No evidence of mitral valve regurgitation. No evidence of mitral stenosis.  4. The aortic valve was not well visualized. There is mild thickening of the aortic valve. Aortic valve regurgitation is not visualized. Mild aortic valve sclerosis is present, with no evidence of aortic valve stenosis.  5. The inferior vena cava is normal in size with greater than 50% respiratory variability, suggesting right atrial pressure of 3 mmHg. Comparison(s): No prior Echocardiogram. FINDINGS  Left Ventricle: Left ventricular ejection fraction, by estimation, is 30 to  35%. The left ventricle has moderately decreased function. The left ventricle demonstrates global hypokinesis. The left ventricular internal cavity size was normal in size. There is no left ventricular hypertrophy. Left ventricular diastolic parameters are consistent with Grade I diastolic dysfunction (impaired relaxation). Right Ventricle: The right ventricular size is normal. No increase in right ventricular wall thickness. Right ventricular systolic function is normal. There is normal pulmonary artery systolic pressure. The tricuspid regurgitant velocity is 2.13 m/s, and  with an assumed right atrial pressure of 3 mmHg, the estimated right ventricular systolic pressure is 76.1 mmHg. Left Atrium: Left atrial size was normal in size. Right Atrium: Right atrial size was normal in size. Pericardium: There is no evidence of pericardial effusion. Mitral Valve: The mitral valve is grossly normal. No evidence of mitral valve regurgitation. No evidence of mitral valve stenosis. Tricuspid Valve: The tricuspid valve is normal in structure. Tricuspid valve regurgitation is not demonstrated. No evidence of tricuspid stenosis. Aortic Valve: The aortic valve was not well visualized. There is mild thickening of the aortic valve. Aortic valve regurgitation is not visualized. Mild aortic valve sclerosis is present, with no evidence of aortic valve stenosis. Pulmonic Valve: The pulmonic valve was not well visualized. Pulmonic valve regurgitation is not visualized. No evidence of pulmonic stenosis. Aorta: The aortic root and ascending aorta are structurally normal, with no evidence of dilitation. Venous: The inferior vena cava is normal in size with greater than 50% respiratory variability, suggesting right atrial pressure of 3 mmHg. IAS/Shunts: The atrial septum is grossly normal. Additional Comments: A device lead is visualized.  LEFT VENTRICLE PLAX 2D LVIDd:         4.80 cm      Diastology LVIDs:         3.90 cm      LV e'  medial:    4.57 cm/s LV PW:         1.00 cm      LV E/e' medial:  22.1 LV IVS:        1.00 cm      LV e' lateral:   7.07 cm/s LVOT diam:     2.20 cm      LV E/e' lateral: 14.3 LV SV:         68 LV SV Index:   33 LVOT Area:     3.80 cm  LV Volumes (MOD) LV vol d, MOD A2C: 136.0 ml LV vol d, MOD A4C: 147.0 ml LV vol s, MOD A2C: 87.3 ml  LV vol s, MOD A4C: 90.2 ml LV SV MOD A2C:     48.7 ml LV SV MOD A4C:     147.0 ml LV SV MOD BP:      52.6 ml RIGHT VENTRICLE             IVC RV S prime:     12.40 cm/s  IVC diam: 1.80 cm TAPSE (M-mode): 1.9 cm LEFT ATRIUM             Index       RIGHT ATRIUM           Index LA diam:        3.40 cm 1.65 cm/m  RA Area:     11.90 cm LA Vol (A2C):   65.5 ml 31.73 ml/m RA Volume:   25.80 ml  12.50 ml/m LA Vol (A4C):   59.8 ml 28.96 ml/m LA Biplane Vol: 67.3 ml 32.60 ml/m  AORTIC VALVE LVOT Vmax:   90.80 cm/s LVOT Vmean:  58.500 cm/s LVOT VTI:    0.179 m  AORTA Ao Root diam: 3.50 cm Ao Asc diam:  3.30 cm MITRAL VALVE                TRICUSPID VALVE MV Area (PHT): 4.89 cm     TR Peak grad:   18.1 mmHg MV Decel Time: 155 msec     TR Vmax:        213.00 cm/s MV E velocity: 101.00 cm/s MV A velocity: 125.00 cm/s  SHUNTS MV E/A ratio:  0.81         Systemic VTI:  0.18 m                             Systemic Diam: 2.20 cm Rudean Haskell MD Electronically signed by Rudean Haskell MD Signature Date/Time: 07/01/2021/6:51:33 PM    Final      Subjective: No issues overnight.  Discharge Exam: Vitals:   07/02/21 0731 07/02/21 1650  BP: 140/89 (!) 158/92  Pulse: 69 74  Resp: 12 18  Temp: 98.9 F (37.2 C) 98.7 F (37.1 C)  SpO2: 100% 99%   Vitals:   07/02/21 0000 07/02/21 0406 07/02/21 0731 07/02/21 1650  BP:  (!) 142/88 140/89 (!) 158/92  Pulse: 70 69 69 74  Resp: _0 Temp:  98.8 F (37.1 C) 98.9 F (37.2 C) 98.7 F (37.1 C)  TempSrc:  Oral Oral Oral  SpO2: 99% 97% 100% 99%  Weight:      Height:        General: Pt is alert, awake, not in acute  distress Cardiovascular: RRR, S1/S2 +, no rubs, no gallops Respiratory: CTA bilaterally, no wheezing, no rhonchi Abdominal: Soft, NT, ND, bowel sounds + Extremities: no cyanosis    The results of significant diagnostics from this hospitalization (including imaging, microbiology, ancillary and laboratory) are listed below for reference.     Microbiology: Recent Results (from the past 240 hour(s))  SARS CORONAVIRUS 2 (TAT 6-24 HRS) Nasopharyngeal Nasopharyngeal Swab     Status: None   Collection Time: 06/30/21  4:43 PM   Specimen: Nasopharyngeal Swab  Result Value Ref Range Status   SARS Coronavirus 2 NEGATIVE NEGATIVE Final    Comment: (NOTE) SARS-CoV-2 target nucleic acids are NOT DETECTED.  The SARS-CoV-2 RNA is generally detectable in upper and lower respiratory specimens during the acute phase of infection. Negative results do not preclude SARS-CoV-2 infection,  do not rule out co-infections with other pathogens, and should not be used as the sole basis for treatment or other patient management decisions. Negative results must be combined with clinical observations, patient history, and epidemiological information. The expected result is Negative.  Fact Sheet for Patients: SugarRoll.be  Fact Sheet for Healthcare Providers: https://www.woods-mathews.com/  This test is not yet approved or cleared by the [place] FDA and  has been authorized for detection and/or diagnosis of SARS-CoV-2 by FDA under an Emergency Use Authorization (EUA). This EUA will remain  in effect (meaning this test can be used) for the duration of the COVID-19 declaration under Se ction 564(b)(1) of the Act, 21 U.S.C. section 360bbb-3(b)(1), unless the authorization is terminated or revoked sooner.  Performed at [facility], [place] [address], [place], [place] [address]      Labs: BNP (last 3 results) Recent Labs    06/30/21 1935  BNP 78.4    Basic Metabolic Panel: Recent Labs  Lab 06/30/21 1625 06/30/21 1655 06/30/21 2044 06/30/21 2306 07/01/21 0233 07/01/21 0323 07/01/21 1656 07/02/21 0037 07/02/21 1731  NA 132* 133*  133*  --  136 139 137  --  138  --   K 2.7* 2.7*  2.7*  --  2.5* 2.5* 2.6* 2.9* 2.9* 3.5  CL 94* 94*  --  103 102 104  --  105  --   CO2 21*  --   --  _0 --  28  --   GLUCOSE 705* >700*  --  161* 110* 86  --  161*  --   BUN 24* 25*  --  21* 22* 20  --  19  --   CREATININE 2.24* 2.00*  --  1.64* 1.58* 1.51*  --  1.75*  --   CALCIUM 9.0  --   --  9.1 9.1 8.7*  --  8.5*  --   MG  --   --  1.4*  --  1.7  --   --   --   --   PHOS  --   --  2.7  --  3.0  --   --   --   --    Liver Function Tests: Recent Labs  Lab 06/30/21 1625 07/01/21 0233  AST 20 16  ALT 15 13  ALKPHOS 85 65  BILITOT 0.6 0.7  PROT 7.2 6.1*  ALBUMIN 3.4* 2.8*   No results for input(s): LIPASE, AMYLASE in the last 168 hours. No results for input(s): AMMONIA in the last 168 hours. CBC: Recent Labs  Lab 06/30/21 1625 06/30/21 1655 07/01/21 0323  WBC 7.3  --  5.6  NEUTROABS 3.8  --  3.5  HGB 12.1* 12.9*  13.3 11.3*  HCT 39.9 38.0*  39.0 36.0*  MCV 81.1  --  79.8*  PLT 247  --  207   Cardiac Enzymes: Recent Labs  Lab 06/30/21 2044  CKTOTAL 386   BNP: Invalid input(s): POCBNP CBG: Recent Labs  Lab 07/02/21 0001 07/02/21 0410 07/02/21 0733 07/02/21 1346 07/02/21 1633  GLUCAP 194* 172* 105* 188* 234*   D-Dimer No results for input(s): DDIMER in the last 72 hours. Hgb A1c No results for input(s): HGBA1C in the last 72 hours. Lipid Profile Recent Labs    07/02/21 0037  CHOL 201*  HDL 36*  LDLCALC 120*  TRIG 225*  CHOLHDL 5.6   Thyroid function studies No results for input(s): TSH, T4TOTAL, T3FREE, THYROIDAB in the last 72 hours.  Invalid input(s): FREET3 Anemia work up No results for input(s): VITAMINB12, FOLATE, FERRITIN, TIBC, IRON, RETICCTPCT in the last 72 hours. Urinalysis     Component Value Date/Time   COLORURINE STRAW (A) 06/30/2021 1735   APPEARANCEUR CLEAR 06/30/2021 1735   LABSPEC 1.021 06/30/2021 1735   PHURINE 6.0 06/30/2021 1735   GLUCOSEU >=500 (A) 06/30/2021 1735   HGBUR MODERATE (A) 06/30/2021 1735   BILIRUBINUR NEGATIVE 06/30/2021 1735   KETONESUR NEGATIVE 06/30/2021 1735   PROTEINUR 100 (A) 06/30/2021 1735   NITRITE NEGATIVE 06/30/2021 1735   LEUKOCYTESUR NEGATIVE 06/30/2021 1735   Sepsis Labs Invalid input(s): PROCALCITONIN,  WBC,  LACTICIDVEN Microbiology Recent Results (from the past 240 hour(s))  SARS CORONAVIRUS 2 (TAT 6-24 HRS) Nasopharyngeal Nasopharyngeal Swab     Status: None   Collection Time: 06/30/21  4:43 PM   Specimen: Nasopharyngeal Swab  Result Value Ref Range Status   SARS Coronavirus 2 NEGATIVE NEGATIVE Final    Comment: (NOTE) SARS-CoV-2 target nucleic acids are NOT DETECTED.  The SARS-CoV-2 RNA is generally detectable in upper and lower respiratory specimens during the acute phase of infection. Negative results do not preclude SARS-CoV-2 infection, do not rule out co-infections with other pathogens, and should not be used as the sole basis for treatment or other patient management decisions. Negative results must be combined with clinical observations, patient history, and epidemiological information. The expected result is Negative.  Fact Sheet for Patients: SugarRoll.be  Fact Sheet for Healthcare Providers: https://www.woods-mathews.com/  This test is not yet approved or cleared by the [place] FDA and  has been authorized for detection and/or diagnosis of SARS-CoV-2 by FDA under an Emergency Use Authorization (EUA). This EUA will remain  in effect (meaning this test can be used) for the duration of the COVID-19 declaration under Se ction 564(b)(1) of the Act, 21 U.S.C. section 360bbb-3(b)(1), unless the authorization is terminated or revoked  sooner.  Performed at [facility], [place] [address], [place], [place] [address]      Time coordinating discharge: 35 minutes  SIGNED:   Cordelia Poche, MD [facility] 07/04/2021, 2:04 PM

## 2021-07-05 ENCOUNTER — Telehealth: Payer: Self-pay

## 2021-07-05 ENCOUNTER — Encounter: Payer: Self-pay | Admitting: Student

## 2021-07-05 LAB — CUP PACEART REMOTE DEVICE CHECK
Battery Remaining Longevity: 98 mo
Battery Voltage: 2.98 V
Brady Statistic RV Percent Paced: 0 %
Date Time Interrogation Session: 20220722224223
HighPow Impedance: 54 Ohm
Implantable Lead Implant Date: 20190204
Implantable Lead Location: 753860
Implantable Pulse Generator Implant Date: 20190204
Lead Channel Impedance Value: 323 Ohm
Lead Channel Impedance Value: 380 Ohm
Lead Channel Pacing Threshold Amplitude: 1.25 V
Lead Channel Pacing Threshold Pulse Width: 0.4 ms
Lead Channel Sensing Intrinsic Amplitude: 10.625 mV
Lead Channel Sensing Intrinsic Amplitude: 10.625 mV
Lead Channel Setting Pacing Amplitude: 3 V
Lead Channel Setting Pacing Pulse Width: 0.4 ms
Lead Channel Setting Sensing Sensitivity: 0.3 mV

## 2021-07-05 NOTE — Telephone Encounter (Signed)
Transition Care Management Follow-up Telephone Call Date of discharge and from where: 07/02/2021, [facility]  How have you been since you were released from the hospital? He stated he is feeling good.  Any questions or concerns? No  Items Reviewed: Did the pt receive and understand the discharge instructions provided? Yes  Medications obtained and verified? Yes  - he said that he has all medications including the new medication and his insulin. He has a glucometer. Instructed him to keep a log of his blood sugars for PCP to review.  He didn't have any questions about his med regime.  Other? No  Any new allergies since your discharge? No  Do you have support at home? Yes   Home Care and Equipment/Supplies: Were home health services ordered? no If so, what is the name of the agency? N/a  Has the agency set up a time to come to the patient's home? not applicable Were any new equipment or medical supplies ordered?  No What is the name of the medical supply agency? N/a Were you able to get the supplies/equipment? not applicable Do you have any questions related to the use of the equipment or supplies? No  Functional Questionnaire: (I = Independent and D = Dependent) ADLs: independent. Has a cane to use if needed  Follow up appointments reviewed:  PCP Hospital f/u appt confirmed? Yes  Scheduled to see Dr Margarita Rana  on 07/19/2021 @ 1050. [facility] f/u appt confirmed?  Needs to schedule appointment with neurology   Are transportation arrangements needed? No  If their condition worsens, is the pt aware to call PCP or go to the Emergency Dept.? Yes Was the patient provided with contact information for the PCP's office or ED? Yes Was to pt encouraged to call back with questions or concerns? Yes

## 2021-07-05 NOTE — Telephone Encounter (Signed)
From the discharge call: Patient has appointment with Dr Margarita Rana 07/19/2021.   He stated he is feeling good.   he said that he has all medications including the new medication and his insulin. He has a glucometer. Instructed him to keep a log of his blood sugars for PCP to review.  He didn't have any questions about his med regime.

## 2021-07-07 ENCOUNTER — Other Ambulatory Visit (HOSPITAL_BASED_OUTPATIENT_CLINIC_OR_DEPARTMENT_OTHER): Payer: Self-pay

## 2021-07-08 ENCOUNTER — Other Ambulatory Visit (HOSPITAL_BASED_OUTPATIENT_CLINIC_OR_DEPARTMENT_OTHER): Payer: Self-pay

## 2021-07-08 ENCOUNTER — Telehealth (HOSPITAL_BASED_OUTPATIENT_CLINIC_OR_DEPARTMENT_OTHER): Payer: Self-pay | Admitting: Pharmacist

## 2021-07-08 ENCOUNTER — Other Ambulatory Visit: Payer: Self-pay

## 2021-07-08 ENCOUNTER — Other Ambulatory Visit (HOSPITAL_COMMUNITY): Payer: Self-pay

## 2021-07-08 NOTE — Telephone Encounter (Signed)
Pharmacy Transitions of Care Follow-up Telephone Call  Date of discharge: 07/02/2021  Discharge Diagnosis: Seizure   Patient Assessment: Mr. [place] appears well over the phone today. States he is doing and feeling better. No signs or symptoms of bleeding. Blood sugars have been good. Has had 1 single hypoglycemic episode (reports BG was 58) and this was corrected with food. FBG this morning was 108. No questions or concerns for me today.   Medication changes made at discharge:  - START: aspirin 81 mg daily and clopidogrel 75 mg once daily x 21 days   Medication changes verified by the patient? YES     Medication Accessibility:  Home Pharmacy: [facility]   Was the patient provided with refills on discharged medications? Yes   Have all prescriptions been [facility] from [facility] to home pharmacy? Completed now   Is the patient able to afford medications? Yes - has insurance and uses [facility]    Medication Review: CLOPIDOGREL (PLAVIX) Clopidogrel 75 mg mg once daily.  - Educated patient on expected duration of therapy of 21 days with clopidogrel. Advised patient that aspirin will be continued indefinitely.  - Reviewed potential DDIs with patient  - Advised patient of medications to avoid (NSAIDs, ASA)  - Educated that Tylenol (acetaminophen) will be the preferred analgesic to prevent risk of bleeding  - Emphasized importance of monitoring for signs and symptoms of bleeding (abnormal bruising, prolonged bleeding, nose bleeds, bleeding from gums, discolored urine, black tarry stools)  - Advised patient to alert all providers of anticoagulation therapy prior to starting a new medication or having a procedure    Follow-up Appointments:  PCP follow-up appointment with Dr. Margarita Rana at [facility] confirmed for 07/19/2021.  If their condition worsens, is the pt aware to call PCP or go to the Emergency Dept.? Yes  Harriet Pho, PharmD Clinical  Pharmacist [facility]  07/08/2021 12:53 PM

## 2021-07-14 ENCOUNTER — Other Ambulatory Visit: Payer: Self-pay

## 2021-07-14 MED ORDER — ISOSORBIDE MONONITRATE ER 30 MG PO TB24: 30.0000 mg | ORAL_TABLET | Freq: Every day | ORAL | 2 refills | Status: DC

## 2021-07-15 ENCOUNTER — Other Ambulatory Visit: Payer: Self-pay

## 2021-07-19 ENCOUNTER — Ambulatory Visit: Payer: Medicare Other | Admitting: Family Medicine

## 2021-07-22 NOTE — Progress Notes (Signed)
Remote ICD transmission.   

## 2021-07-27 ENCOUNTER — Other Ambulatory Visit: Payer: Self-pay

## 2021-07-27 ENCOUNTER — Other Ambulatory Visit: Payer: Self-pay | Admitting: Family Medicine

## 2021-07-27 MED ORDER — BD PEN NEEDLE NANO U/F 32G X 4 MM MISC
0 refills | Status: DC
  Filled 2021-07-27: qty 100, 30d supply, fill #0

## 2021-07-27 MED FILL — Furosemide Tab 40 MG: ORAL | 30 days supply | Qty: 45 | Fill #1 | Status: AC

## 2021-07-27 NOTE — Telephone Encounter (Signed)
  Notes to clinic:  script has expired  Review for new script    Requested Prescriptions  Pending Prescriptions Disp Refills   Insulin Pen Needle (TRUEPLUS PEN NEEDLES) 32G X 4 MM MISC 100 each 0    Sig: USE TO INJECT LANTUS DAILY. MUST USE NEW PEN NEEDLE WITH EACH INJECTION.     Endocrinology: Diabetes - Testing Supplies Passed - 07/27/2021 12:30 PM      Passed - Valid encounter within last 12 months    Recent Outpatient Visits           5 months ago Hypertensive heart disease with chronic systolic congestive heart failure ([place])   [place], Enobong, MD   6 months ago Non-compliance   [place], Enobong, MD   1 year ago Screening for colon cancer   [place], Charlane Ferretti, MD   2 years ago Chronic combined systolic (congestive) and diastolic (congestive) heart failure [facility])   [place], Charlane Ferretti, MD   2 years ago Uncontrolled type 2 diabetes mellitus with hyperglycemia, with long-term current use of insulin ([place])   [place], MD       Future Appointments             In 6 days Charlott Rakes, MD [place]

## 2021-07-28 ENCOUNTER — Other Ambulatory Visit: Payer: Self-pay

## 2021-08-02 ENCOUNTER — Encounter: Payer: Self-pay | Admitting: Internal Medicine

## 2021-08-02 ENCOUNTER — Ambulatory Visit: Payer: Medicare Other | Admitting: Family Medicine

## 2021-08-02 ENCOUNTER — Encounter (HOSPITAL_COMMUNITY): Payer: Self-pay | Admitting: Vascular Surgery

## 2021-08-02 ENCOUNTER — Other Ambulatory Visit: Payer: Self-pay

## 2021-08-02 ENCOUNTER — Encounter (HOSPITAL_COMMUNITY): Payer: Self-pay | Admitting: Ophthalmology

## 2021-08-02 NOTE — Progress Notes (Signed)
Jeffrey [name]  Patient Information: Name:  Jeffrey [name]  DOB:  1966-04-20  MRN:  [id number]    Planned Procedure:  (no orders - per posting: Left pars plana vitrectomy with 25 gauge)  Surgeon:  Jalene Mullet, MD  Date of Procedure:  08/03/21  Cautery will be used.  Position during surgery:     Please send documentation back to:  [facility] (Fax # [phone])   Device Information:  Clinic EP Physician:  Virl Axe, MD   Device Type:  Defibrillator Manufacturer and Phone #:  [employer]tr[employer]: [id number] Pacemaker Dependent?:  No. Date of Last Device Check:  07/11/21 (remote) Normal Device Function?:  Yes.    Electrophysiologist's Recommendations:  Have magnet available. Provide continuous ECG monitoring when magnet is used or reprogramming is to be performed.  Procedure may interfere with device function.  Magnet should be placed over device during procedure.  Per Device Clinic Standing Orders, Simone Curia, RN  9:41 AM 08/02/2021

## 2021-08-02 NOTE — Progress Notes (Signed)
PCP - Dr. Margarita Rana Cardiologist - Dr. Acie Fredrickson and Dr. Caryl Comes (for ICD) EKG - 06/30/21 Chest x-ray -  ECHO - 07/01/21 Cardiac Cath - 01/31/18 CPAP - scheduled for sleep study but currently does not wear any O2 for sleep or CPAP  Fasting Blood Sugar:  has not been over 172 per spouse [employer] Blood Sugar:  2x/day (per wife MD instructed 4x daily but pt only checks manually 2x daily)  Blood Thinner Instructions:  Aspirin Instructions: Follow your surgeon's instructions on when to stop Aspirin.  If no instructions were given by your surgeon then you will need to call the office to get those instructions.    ERAS Protcol - n/a   COVID TEST- /na  Anesthesia review: yes AICD - rep notified and device orders requested, cardiac hx  -------------  SDW INSTRUCTIONS:  Your procedure is scheduled on Tuesday 08/03/21. Please [facility] to [facility]A" at 1:00 PM, and check in at the Admitting office. Call this number if you have problems the morning of surgery: [phone]   Remember: Do not eat after midnight the night before your surgery  You may drink clear liquids until 12:30 the day of your surgery.   Clear liquids allowed are: Water, Non-Citrus Juices (without pulp), Carbonated Beverages, Clear Tea, Black Coffee Only, and Gatorade   Medications to take morning of surgery with a sip of water include: carvedilol (COREG) gabapentin (NEURONTIN) hydrALAZINE (APRESOLINE) isosorbide mononitrate (IMDUR) rosuvastatin (CRESTOR)   If needed: albuterol (PROVENTIL HFA;VENTOLIN HFA) --- Please bring all inhalers with you the day of surgery.  cetirizine (ZYRTEC)  Follow your surgeon's instructions on when to stop Aspirin.  If no instructions were given by your surgeon then you will need to call the office to get those instructions.    As of today, STOP taking any Aleve, Naproxen, Ibuprofen, Motrin, Advil, Goody's, BC's, all herbal medications, fish oil, and all vitamins.   **  PLEASE check your blood sugar the morning of your surgery when you wake up and every 2 hours until you get to the Short Stay unit.  If your blood sugar is less than 70 mg/dL, you will need to treat for low blood sugar: Do not take insulin. Treat a low blood sugar (less than 70 mg/dL) with  cup of clear juice (cranberry or apple), 4 glucose tablets, OR glucose gel. Recheck blood sugar in 15 minutes after treatment (to make sure it is greater than 70 mg/dL). If your blood sugar is not greater than 70 mg/dL on recheck, call [phone] for further instructions.  NOVOLOG 8/22: no PM dose  8/23: none; if CBG >220 take 1/2 of usual SS dose   LANTUS SOLOSTAR 8/22: 20 units PM dose 8/23: 20 none (pt only takes PM)   The Morning of Surgery Do not wear jewelry Do not wear lotions, powders, colognes, or deodorant Do not bring valuables to the hospital. [facility] is not responsible for any belongings or valuables.  If you are a smoker, DO NOT Smoke 24 hours prior to surgery  If you wear a CPAP at night please bring your mask the morning of surgery   Remember that you must have someone to transport you home after your surgery, and remain with you for 24 hours if you are discharged the same day.  Please bring cases for contacts, glasses, hearing aids, dentures or bridgework because it cannot be worn into surgery.   Patients discharged the day of surgery will not be allowed to  drive home.   Please shower the NIGHT BEFORE/MORNING OF SURGERY (use antibacterial soap like DIAL soap if possible). Wear comfortable clothes the morning of surgery. Oral Hygiene is also important to reduce your risk of infection.  Remember - BRUSH YOUR TEETH THE MORNING OF SURGERY WITH YOUR REGULAR TOOTHPASTE  Patient denies shortness of breath, fever, cough and chest pain.

## 2021-08-02 NOTE — Progress Notes (Signed)
Anesthesia Chart Review:  Case: [id number] Date/Time: 08/03/21 1515   Procedures:      PARS PLANA VITRECTOMY WITH 25 GAUGE (Left)     PHOTOCOAGULATION WITH LASER (Left)   Anesthesia type: Monitor Anesthesia Care   Pre-op diagnosis: OTHER MECHANICAL COMPLICATIONS OF OTHER OCCULAR PROSTHETIC DEVISES , IMPLANTS AND GRAFTS. RETINAL DETACHMENT   Location: [place] OR ROOM 08 / [place] [facility]   Surgeons: Jalene Mullet, MD       DISCUSSION: Patient is a 55 year old male scheduled for the above procedure.   History includes never smoker, DM2, HTN, HLD, CAD (non-obstructive, 7322), chronic systolic CHF (diagnosed 0254), nonischemic cardiomyopathy, ICD (Medtronic [id number] Visia AF MRI VR ICD 01/15/18), dyspnea, CKD (stage 3a), anemia, diabetic foot ulcer (s/p right great and 2nd toe amputations 2017), eye surgery (right para plan vitrectomy 07/14/20; repair hemorrhagic detached right retina 08/25/20), back surgery.  Positive COVID-19 12/03/2019 & 12/21/20.  Reported he is scheduled for sleep study but does not currently use any O2 or CPAP for sleep.  - Admission to [facility] 06/30/21-07/04/21 for hyperosmolar hyperglycemic state with seizure-like activity believed secondary to hyperglycemia and hypokalemia. Reportedly seizure in setting of hyperglycemia years ago as well. In the ED he was noted to have significant electrolyte abnormalities with a glucose > 700 and his potassium was 2.7.  BP 201/104.  He has not been compliant with his medications or diet recently.  He was treated with potassium, insulin, D5 LR, NS, MgSO4.  A1c 14.2 %, Lantus and SSI resumed. EEG normal. 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 No evidence of mass effect or hemorrhage. Neurology consulted. Echo showed no  thrombus and MRA head/neck showed no significant stenosis or occlusions.  Seizures felt provoked, so no anti-seizure medication recommended. Acute/subacute infarcts felt likely due to intracellular dehydration from severe hyperglycemia. ASA/Plavix for 3 weeks followed by ASA alone recommended and well as aggressive risk factor modification.  Cardiology also consulted for CHF and uncontrolled hypertension.  Not felt to be significantly volume overloaded on exam.  Home BP medications resumed although Entresto and spironolactone held for acute on chronic kidney disease.  Echo showed stable LVEF 30 to 35%.  As above A1c greater than 14% last month.  Insulin resumed.  Per medication list he is currently on Lantus 40 mg nightly and NovoLog 100 units/mL 0-15 units 3 times daily with meals.  Per PAT RN phone interview, patient now reports fasting CBGs have not been over 172.  He monitors his home CBGs twice daily. He will get a CBG on the day of surgery.    Perioperative cardiac device Rx: Device Information: Clinic EP Physician:  Virl Axe, MD    Device Type:  Defibrillator Manufacturer and Phone #:  Medtronic: [phone] Pacemaker Dependent?:  No. Date of Last Device Check:  07/11/21 (remote)        Normal Device Function?:  Yes.     Electrophysiologist's Recommendations: Have magnet available. Provide continuous ECG monitoring when magnet is used or reprogramming is to be performed.  Procedure may interfere with device function.  Magnet should be placed over device during procedure.    He is a same-day work-up, so labs and anesthesia team evaluation on the day of surgery.  04/04/2020 presurgical COVID-19 test negative.  VS: Ht _0  (1.702 m)   Wt 95.3 kg   BMI 32.89 kg/m  BP Readings from Last 3 Encounters:  07/02/21 (!) 158/92  03/30/21 (!) 160/90  03/11/21 (!) 160/90   Pulse Readings from Last 3 Encounters:  07/02/21 74  03/30/21 78  03/11/21 90     PROVIDERS: Charlott Rakes, MD is PCP. Last evaluation 01/07/20.  Virl Axe, MD is EP cardiologist. Last office evaluation 03/11/21 by Barrington Ellison, PA-C. His HF has failed to improved despite titration of guideline directed medication, so was being considered for the [facility] NEO device.  He apparently failed the screening for the [facility] study so referral sent to vascular surgeon Brabham, V. Rock Nephew, MD for further evaluation. in patient evaluation by Skeet Latch, MD during July 2022 admission as above.   Nahser, Arnette Norris, MD is listed as primary cardiologist, but primarily is followed by EP.   LABS: For day of surgery. Last labs 07/02/21. Cr 1.75 (Cr 1.36-2.24 since 12/22/20, primarily 1.5-1.6 range) , K 3.5. H/H 11.3/36.0 on 07/01/21.    OTHER: EEG 07/01/21: IMPRESSION: This study is within normal limits. No seizures or epileptiform discharges were seen throughout the recording.   IMAGES: MRA Head 07/02/21: IMPRESSION: Negative head MRA.  MRA Neck 07/02/21: IMPRESSION: 1. Study limitations as detailed above [see full report] primarily affecting assessment of the proximal portions of the common carotid and vertebral arteries. 2. No evidence of significant cervical carotid or vertebral artery stenosis or dissection where the vessels were adequately visualized.  1V PCXR 06/30/21: FINDINGS: - The heart size and mediastinal contours are unchanged. Left chest wall single lead cardiac device in stable position. - Persistent left base atelectasis with minimally elevated left hemidiaphragm. No focal consolidation. No pulmonary edema. No pleural effusion. No pneumothorax. - No acute osseous abnormality. IMPRESSION: No active disease.    EKG: 06/30/21: Sinus tachycardia at 100 bpm Borderline T abnormalities, inferior leads Prolonged QT interval Confirmed by Davonna Belling [phone]) on 07/01/2021 10:30:12 AM   CV: Echo 07/01/21: IMPRESSIONS   1. Left ventricular ejection fraction, by estimation,  is 30 to 35%. The  left ventricle has moderately decreased function. The left ventricle  demonstrates global hypokinesis. Left ventricular diastolic parameters are  consistent with Grade I diastolic  dysfunction (impaired relaxation).   2. Right ventricular systolic function is normal. The right ventricular  size is normal. There is normal pulmonary artery systolic pressure.   3. The mitral valve is grossly normal. No evidence of mitral valve  regurgitation. No evidence of mitral stenosis.   4. The aortic valve was not well visualized. There is mild thickening of  the aortic valve. Aortic valve regurgitation is not visualized. Mild  aortic valve sclerosis is present, with no evidence of aortic valve  stenosis.   5. The inferior vena cava is normal in size with greater than 50%  respiratory variability, suggesting right atrial pressure of 3 mmHg.  (Comparison: LVEF 20-25% 01/29/17 echo; 10-15% 01/31/17 cath; LVEF 20% dffuse hypokinesis, grade 2 DD, RV systolic functio nmoderately-severely reduced 01/14/18 echo; LVEF 30-35%, normal RVSF 06/13/20 echo; LVEF 30-35%, grade 2 DD, normal RVSF 03/30/21 echo)    US Carotid 03/30/21: Summary:  Right Carotid: Velocities in the right ICA are consistent with a 1-39% stenosis.  Left Carotid: Velocities in the left ICA are consistent with a 1-39% stenosis.  Vertebrals:  Bilateral vertebral arteries demonstrate antegrade flow.  Subclavians: Normal flow hemodynamics were seen in bilateral subclavian arteries.    Cardiac cath 01/31/17: 1st  RPLB lesion, 60 %stenosed. Dist RCA lesion, 60 %stenosed. Prox RCA lesion, 55 %stenosed. Prox LAD lesion, 10 %stenosed. There is severe left ventricular systolic dysfunction. LV end diastolic pressure is mildly elevated. - Severe global LV dysfunction with an ejection fraction of 10-15%.  The pattern is one of a nonischemic cardiomyopathy. - Mild coronary obstructive disease with smooth 10% narrowing in the LAD; normal  ramus intermediate, normal left circumflex; and RCA with 50-60% proximal stenosis and distal tapering of 60%.  Prior to giving rise to 3 small distal branches with 60% narrowing in a small inferior LV branch.  The patient's LV dysfunction is out of proportion to his CAD. - Initiation of medical therapy with carvedilol, spironolactone, and probable initiation of angiotension receptorblocker/neprilysin inhibition therapy.  Consider short-term life-vest prior to discharge to allow for possible medication induced improvement in LV function. [S/p ICD 01/15/18]    Past Medical History:  Diagnosis Date   AICD (automatic cardioverter/defibrillator) present    Medtronic   AICD (automatic cardioverter/defibrillator) present    MDT Visia AF MRI   Anemia    CAD (coronary artery disease)    a. cath 01/31/17: 60% 1st RPLB, 60% dist RCA, 55% prox RCA, 10% pro LAD --> Rx TX.    CHF (congestive heart failure) (HCC)    Chronic systolic CHF (congestive heart failure) ([place]) 01/28/2017   1. Echo 01/29/17:  EF 20-25, normal wall motion, mild LAE // 2. EF 10-15 by [facility] 01/2017    Coronary artery disease    Diabetes mellitus    type II   Diabetes mellitus without complication ([place])    Diabetic foot infection ([place]) 03/2016   RT FOOT   Dyspnea    History of kidney stones    passed   History of kidney stones    HTN (hypertension)    Hyperlipidemia    Hypertension    Myocardial infarction [facility]), although reported, NICM at cath    NICM (nonischemic cardiomyopathy) ([place]) 02/15/2017   1. Mod non-obs CAD on LHC in 01/2017 - CAD does not explain cardiomyopathy   Renal disorder     Past Surgical History:  Procedure Laterality Date   AIR/FLUID EXCHANGE Right 07/14/2020   Procedure: AIR/FLUID EXCHANGE;  Surgeon: Jalene Mullet, MD;  Location: [place];  Service: Ophthalmology;  Laterality: Right;   AMPUTATION Right 04/01/2016   Procedure: Right Great Toe Amputation;  Surgeon: Newt Minion, MD;  Location: [place];  Service:  Orthopedics;  Laterality: Right;   AMPUTATION Right 06/19/2016   Procedure: AMPUTATION SECOND TOE;  Surgeon: Marybelle Killings, MD;  Location: [place];  Service: Orthopedics;  Laterality: Right;   BACK SURGERY     for abscess   CARDIAC CATHETERIZATION     ICD IMPLANT N/A 01/15/2018   Procedure: ICD IMPLANT;  Surgeon: Deboraha Sprang, MD;  Location: [place] CV LAB;  Service: Cardiovascular;  Laterality: N/A;   INJECTION OF SILICONE OIL Right 5/0/9326   Procedure: INJECTION OF SILICONE OIL;  Surgeon: Jalene Mullet, MD;  Location: [place];  Service: Ophthalmology;  Laterality: Right;   INJECTION OF SILICONE OIL Right 06/23/4579   Procedure: INJECTION OF SILICONE OIL;  Surgeon: Jalene Mullet, MD;  Location: [place];  Service: Ophthalmology;  Laterality: Right;   INSERT / REPLACE / REMOVE PACEMAKER     MEMBRANE PEEL Right 08/25/2020   Procedure: MEMBRANE PEEL;  Surgeon: Jalene Mullet, MD;  Location: [place];  Service: Ophthalmology;  Laterality: Right;   PARS PLANA VITRECTOMY Right 07/14/2020  Procedure: PARS PLANA VITRECTOMY WITH 25 GAUGE, Membranetomy, drainage of subretinal fluid;  Surgeon: Jalene Mullet, MD;  Location: [place];  Service: Ophthalmology;  Laterality: Right;   PARS PLANA VITRECTOMY Right 08/25/2020   Procedure: PARS PLANA VITRECTOMY WITH 25 GAUGE;  Surgeon: Jalene Mullet, MD;  Location: [place];  Service: Ophthalmology;  Laterality: Right;   PHOTOCOAGULATION WITH LASER Right 07/14/2020   Procedure: PHOTOCOAGULATION WITH LASER;  Surgeon: Jalene Mullet, MD;  Location: [place];  Service: Ophthalmology;  Laterality: Right;   PHOTOCOAGULATION WITH LASER Right 08/25/2020   Procedure: PHOTOCOAGULATION WITH LASER;  Surgeon: Jalene Mullet, MD;  Location: [place];  Service: Ophthalmology;  Laterality: Right;   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   Procedure: REPAIR OF HEMORRHAGIC DETACHMENT;  Surgeon: Jalene Mullet, MD;  Location: [place];  Service: Ophthalmology;  Laterality:  Right;   RIGHT/LEFT HEART CATH AND CORONARY ANGIOGRAPHY N/A 01/31/2017   Procedure: Right/Left Heart Cath and Coronary Angiography;  Surgeon: Troy Sine, MD;  Location: [place] CV LAB;  Service: Cardiovascular;  Laterality: N/A;   SILICON OIL REMOVAL Right 0/16/0109   Procedure: SILICON OIL REMOVAL;  Surgeon: Jalene Mullet, MD;  Location: [place];  Service: Ophthalmology;  Laterality: Right;    MEDICATIONS: No current facility-administered medications for this encounter.    albuterol (PROVENTIL HFA;VENTOLIN HFA) 108 (90 Base) MCG/ACT inhaler   aspirin 81 MG EC tablet   carvedilol (COREG) 12.5 MG tablet   cetirizine (ZYRTEC) 10 MG tablet   furosemide (LASIX) 40 MG tablet   gabapentin (NEURONTIN) 400 MG capsule   hydrALAZINE (APRESOLINE) 25 MG tablet   isosorbide mononitrate (IMDUR) 30 MG 24 hr tablet   LANTUS SOLOSTAR 100 UNIT/ML Solostar Pen   NOVOLOG 100 UNIT/ML injection   rosuvastatin (CRESTOR) 40 MG tablet   Accu-Chek Softclix Lancets lancets   Accu-Chek Softclix Lancets lancets   benzonatate (TESSALON) 100 MG capsule   Blood Glucose Monitoring Suppl (ACCU-CHEK GUIDE) w/Device KIT   Blood Glucose Monitoring Suppl (TRUE METRIX METER) DEVI   ferrous sulfate 325 (65 FE) MG tablet   glucose blood (ACCU-CHEK GUIDE) test strip   glucose blood (ACCU-CHEK GUIDE) test strip   insulin aspart (NOVOLOG) 100 UNIT/ML injection   insulin glargine (LANTUS) 100 UNIT/ML Solostar Pen   Insulin Pen Needle (BD PEN NEEDLE NANO U/F) 32G X 4 MM MISC   ondansetron (ZOFRAN) 4 MG tablet   potassium chloride (KLOR-CON) 10 MEQ tablet   sacubitril-valsartan (ENTRESTO) 97-103 MG   spironolactone (ALDACTONE) 25 MG tablet    Myra Gianotti, PA-C Surgical Short Stay/Anesthesiology [facility] Phone [phone] [facility] Phone [phone] 08/02/2021 4:14 PM

## 2021-08-02 NOTE — Anesthesia Preprocedure Evaluation (Deleted)
Anesthesia Evaluation    Airway        Dental   Pulmonary           Cardiovascular hypertension,      Neuro/Psych    GI/Hepatic   Endo/Other  diabetes  Renal/GU      Musculoskeletal   Abdominal   Peds  Hematology   Anesthesia Other Findings   Reproductive/Obstetrics                             Anesthesia Physical Anesthesia Plan  ASA:   Anesthesia Plan:    Post-op Pain Management:    Induction:   PONV Risk Score and Plan:   Airway Management Planned:   Additional Equipment:   Intra-op Plan:   Post-operative Plan:   Informed Consent:   Plan Discussed with:   Anesthesia Plan Comments: (PAT note written 08/02/2021 by Myra Gianotti, PA-C. )        Anesthesia Quick Evaluation

## 2021-08-03 ENCOUNTER — Emergency Department (HOSPITAL_COMMUNITY)
Admission: EM | Admit: 2021-08-03 | Discharge: 2021-08-03 | Disposition: A | Discharge status: Home / Self Care | Payer: Medicare Other | Attending: Emergency Medicine | Admitting: Emergency Medicine

## 2021-08-03 ENCOUNTER — Other Ambulatory Visit: Payer: Self-pay

## 2021-08-03 ENCOUNTER — Ambulatory Visit (HOSPITAL_COMMUNITY): Admission: RE | Admit: 2021-08-03 | Payer: Medicare Other | Source: Home / Self Care | Admitting: Ophthalmology

## 2021-08-03 ENCOUNTER — Encounter (HOSPITAL_COMMUNITY): Payer: Self-pay | Admitting: Emergency Medicine

## 2021-08-03 DIAGNOSIS — Z7982 Long term (current) use of aspirin: Secondary | ICD-10-CM | POA: Insufficient documentation

## 2021-08-03 DIAGNOSIS — D631 Anemia in chronic kidney disease: Secondary | ICD-10-CM | POA: Diagnosis not present

## 2021-08-03 DIAGNOSIS — E11649 Type 2 diabetes mellitus with hypoglycemia without coma: Secondary | ICD-10-CM | POA: Diagnosis not present

## 2021-08-03 DIAGNOSIS — Z794 Long term (current) use of insulin: Secondary | ICD-10-CM | POA: Diagnosis not present

## 2021-08-03 DIAGNOSIS — N183 Chronic kidney disease, stage 3 unspecified: Secondary | ICD-10-CM | POA: Insufficient documentation

## 2021-08-03 DIAGNOSIS — Z955 Presence of coronary angioplasty implant and graft: Secondary | ICD-10-CM | POA: Diagnosis not present

## 2021-08-03 DIAGNOSIS — I2510 Atherosclerotic heart disease of native coronary artery without angina pectoris: Secondary | ICD-10-CM | POA: Insufficient documentation

## 2021-08-03 DIAGNOSIS — Z79899 Other long term (current) drug therapy: Secondary | ICD-10-CM | POA: Diagnosis not present

## 2021-08-03 DIAGNOSIS — I130 Hypertensive heart and chronic kidney disease with heart failure and stage 1 through stage 4 chronic kidney disease, or unspecified chronic kidney disease: Secondary | ICD-10-CM | POA: Diagnosis not present

## 2021-08-03 DIAGNOSIS — I5042 Chronic combined systolic (congestive) and diastolic (congestive) heart failure: Secondary | ICD-10-CM | POA: Diagnosis not present

## 2021-08-03 DIAGNOSIS — Z95810 Presence of automatic (implantable) cardiac defibrillator: Secondary | ICD-10-CM | POA: Diagnosis not present

## 2021-08-03 DIAGNOSIS — E1140 Type 2 diabetes mellitus with diabetic neuropathy, unspecified: Secondary | ICD-10-CM | POA: Diagnosis not present

## 2021-08-03 DIAGNOSIS — I10 Essential (primary) hypertension: Secondary | ICD-10-CM

## 2021-08-03 DIAGNOSIS — E162 Hypoglycemia, unspecified: Secondary | ICD-10-CM

## 2021-08-03 DIAGNOSIS — R4182 Altered mental status, unspecified: Secondary | ICD-10-CM | POA: Diagnosis present

## 2021-08-03 LAB — CBG MONITORING, ED
Glucose-Capillary: 98 mg/dL (ref 70–99)
Glucose-Capillary: 130 mg/dL — ABNORMAL HIGH (ref 70–99)
Glucose-Capillary: 76 mg/dL (ref 70–99)

## 2021-08-03 LAB — BASIC METABOLIC PANEL
Anion gap: 7 (ref 5–15)
BUN: 16 mg/dL (ref 6–20)
CO2: 24 mmol/L (ref 22–32)
Calcium: 8.9 mg/dL (ref 8.9–10.3)
Chloride: 106 mmol/L (ref 98–111)
Creatinine, Ser: 1.76 mg/dL — ABNORMAL HIGH (ref 0.61–1.24)
GFR, Estimated: 45 mL/min — ABNORMAL LOW (ref >60)
Glucose, Bld: 161 mg/dL — ABNORMAL HIGH (ref 70–99)
Potassium: 3.6 mmol/L (ref 3.5–5.1)
Sodium: 137 mmol/L (ref 135–145)

## 2021-08-03 LAB — CBC
HCT: 40.3 % (ref 39.0–52.0)
Hemoglobin: 12 g/dL — ABNORMAL LOW (ref 13.0–17.0)
MCH: 24.4 pg — ABNORMAL LOW (ref 26.0–34.0)
MCHC: 29.8 g/dL — ABNORMAL LOW (ref 30.0–36.0)
MCV: 81.9 fL (ref 80.0–100.0)
Platelets: 288 10*3/uL (ref 150–400)
RBC: 4.92 MIL/uL (ref 4.22–5.81)
RDW: 15.2 % (ref 11.5–15.5)
WBC: 5.8 10*3/uL (ref 4.0–10.5)
nRBC: 0 % (ref 0.0–0.2)

## 2021-08-03 SURGERY — PARS PLANA VITRECTOMY WITH 25 GAUGE
Anesthesia: Monitor Anesthesia Care | Laterality: Left

## 2021-08-03 MED ORDER — CARVEDILOL 12.5 MG PO TABS: 12.5000 mg | ORAL_TABLET | Freq: Two times a day (BID) | ORAL | Status: DC

## 2021-08-03 MED ORDER — SODIUM CHLORIDE 0.9 % IV SOLN: 250.0000 mL | INTRAVENOUS | Status: DC | PRN

## 2021-08-03 MED ORDER — SODIUM CHLORIDE 0.9% FLUSH
3.0000 mL | Freq: Two times a day (BID) | INTRAVENOUS | Status: DC
  Administered 2021-08-03: 3 mL via INTRAVENOUS

## 2021-08-03 MED ORDER — HYDRALAZINE HCL 25 MG PO TABS
25.0000 mg | ORAL_TABLET | Freq: Three times a day (TID) | ORAL | Status: DC
  Administered 2021-08-03: 25 mg via ORAL
  Filled 2021-08-03: qty 1

## 2021-08-03 MED ORDER — SODIUM CHLORIDE 0.9% FLUSH: 3.0000 mL | INTRAVENOUS | Status: DC | PRN

## 2021-08-03 NOTE — Progress Notes (Signed)
No procedure orders. Called Dr. Posey Pronto to obtain orders; he stated he will place them.

## 2021-08-03 NOTE — H&P (Signed)
Date of examination:  08/03/21  Indication for surgery: Sudden vision loss and vitreous hemorrhage left eye  Pertinent past medical history:  Past Medical History:  Diagnosis Date   AICD (automatic cardioverter/defibrillator) present    Medtronic   AICD (automatic cardioverter/defibrillator) present    MDT Visia AF MRI   Anemia    CAD (coronary artery disease)    a. cath 01/31/17: 60% 1st RPLB, 60% dist RCA, 55% prox RCA, 10% pro LAD --> Rx TX.    CHF (congestive heart failure) (HCC)    Chronic systolic CHF (congestive heart failure) ([place]) 01/28/2017   1. Echo 01/29/17:  EF 20-25, normal wall motion, mild LAE // 2. EF 10-15 by [facility] 01/2017    Coronary artery disease    Diabetes mellitus    type II   Diabetes mellitus without complication ([place])    Diabetic foot infection ([place]) 03/2016   RT FOOT   Dyspnea    History of kidney stones    passed   History of kidney stones    HTN (hypertension)    Hyperlipidemia    Hypertension    Myocardial infarction [facility]), although reported, NICM at cath    NICM (nonischemic cardiomyopathy) ([place]) 02/15/2017   1. Mod non-obs CAD on LHC in 01/2017 - CAD does not explain cardiomyopathy   Renal disorder     Pertinent ocular history:  proliferative diabetic retinopathy  Pertinent family history:  Family History  Problem Relation Age of Onset   Diabetes Mother    Hypertension Mother    Diabetes Father    Heart attack Father    Diabetes Sister    Heart attack Maternal Grandmother     General:  Healthy appearing patient in no distress.    Eyes:    Acuity  OS 20/60   External: Within normal limits     Anterior segment: Within normal limits     Fundus: Vitreous hemorrhage and tractional retinal detachment left eye    Impression: Tractional retinal detachment left eye  Plan: Complex retinal detachment repair left eye with vitrectomy, membranectomy, endolaser and gas vs silicone oil tamponade  Jalene Mullet, MD

## 2021-08-03 NOTE — ED Provider Notes (Signed)
[facility] EMERGENCY DEPARTMENT Provider Note   CSN: [id number] Arrival date & time: 08/03/21  1319     History Chief Complaint  Patient presents with   Hypoglycemia    Jeffrey [name] is a 55 y.o. male.  He is here for evaluation of a unresponsive episode.  He was found unresponsive by his girlfriend at home.  EMS arrived and found his blood sugar to be 35.  He was given glucose with improvement in his mental status and blood sugar.  He is diabetic and on insulin.  He said he has been eating and drinking well.  He feels back to baseline now.  He said its been a long time since he had a low blood sugar event  The history is provided by the patient.  Hypoglycemia Initial blood sugar:  35 Severity:  Severe Onset quality:  Unable to specify Progression:  Resolved Chronicity:  New Diabetic status:  Controlled with insulin Context: not decreased oral intake and not diet changes   Relieved by:  IV glucose Ineffective treatments:  None tried Associated symptoms: altered mental status and decreased responsiveness   Associated symptoms: no shortness of breath   Risk factors: no alcohol abuse and no drug use       Past Medical History:  Diagnosis Date   AICD (automatic cardioverter/defibrillator) present    [employer]   AICD (automatic cardioverter/defibrillator) present    MDT Visia AF MRI   Anemia    CAD (coronary artery disease)    a. cath 01/31/17: 60% 1st RPLB, 60% dist RCA, 55% prox RCA, 10% pro LAD --> Rx TX.    CHF (congestive heart failure) (HCC)    Chronic systolic CHF (congestive heart failure) ([place]) 01/28/2017   1. Echo 01/29/17:  EF 20-25, normal wall motion, mild LAE // 2. EF 10-15 by [facility] 01/2017    Coronary artery disease    Diabetes mellitus    type II   Diabetes mellitus without complication ([place])    Diabetic foot infection ([place]) 03/2016   RT FOOT   Dyspnea    History of kidney stones    passed   History of kidney stones    HTN  (hypertension)    Hyperlipidemia    Hypertension    Myocardial infarction [facility]), although reported, NICM at cath    NICM (nonischemic cardiomyopathy) ([place]) 02/15/2017   1. Mod non-obs CAD on LHC in 01/2017 - CAD does not explain cardiomyopathy   Renal disorder     Patient Active Problem List   Diagnosis Date Noted   Elevated troponin 07/01/2021   Seizure ([place]) 07/01/2021   Acute stroke due to ischemia [facility])    Hyperglycemia 06/30/2021   Prolonged QT interval 06/30/2021   Acute on chronic renal failure ([place]) 06/30/2021   Hypokalemia 06/30/2021   Hyperosmolar hyperglycemic state (HHS) ([place]) 41/93/7902   Chronic systolic CHF (congestive heart failure) ([place]) 06/30/2021   Essential hypertension 06/30/2021   Uncontrolled type 2 DM with hyperosmolar nonketotic hyperglycemia ([place]) 06/30/2021   Anemia due to chronic kidney disease 06/30/2021   CKD (chronic kidney disease), stage III ([place]) 06/30/2021   CAD (coronary artery disease) 06/30/2021   Fall due to seizure ([place]) 06/30/2021   Nephrotic syndrome    Hyperglycemia due to diabetes mellitus ([place]) 06/12/2020   Hypoglycemia 40/97/3532   Acute metabolic encephalopathy 99/24/2683   S/P ICD (internal cardiac defibrillator) procedure 01/19/2018   Cardiac device in situ    Syncope and collapse 01/13/2018   NICM (nonischemic  cardiomyopathy) ([place]) 02/15/2017   Coronary artery disease involving native coronary artery of native heart without angina pectoris 02/15/2017   Hyperlipidemia 02/15/2017   Chronic combined systolic (congestive) and diastolic (congestive) heart failure ([place]) 94/85/4627   Folliculitis 03/50/0938   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 ([place]) 18/29/9371   Anemia, iron deficiency    Noncompliance with medication regimen 03/30/2016   Uncontrolled type 2 diabetes mellitus with hyperglycemia, with long-term current use of insulin ([place])    Cellulitis 09/25/2015   Essential hypertension 08/21/2013   Diabetic  neuropathy ([place]) 08/21/2013   Fungal toenail infection 08/21/2013    Past Surgical History:  Procedure Laterality Date   AIR/FLUID EXCHANGE Right 07/14/2020   Procedure: AIR/FLUID EXCHANGE;  Surgeon: Jalene Mullet, MD;  Location: [place];  Service: Ophthalmology;  Laterality: Right;   AMPUTATION Right 04/01/2016   Procedure: Right Great Toe Amputation;  Surgeon: Newt Minion, MD;  Location: [place];  Service: Orthopedics;  Laterality: Right;   AMPUTATION Right 06/19/2016   Procedure: AMPUTATION SECOND TOE;  Surgeon: Marybelle Killings, MD;  Location: [place];  Service: Orthopedics;  Laterality: Right;   BACK SURGERY     for abscess   CARDIAC CATHETERIZATION     ICD IMPLANT N/A 01/15/2018   Procedure: ICD IMPLANT;  Surgeon: Deboraha Sprang, MD;  Location: [place];  Service: Cardiovascular;  Laterality: N/A;   INJECTION OF SILICONE OIL Right 05/20/6788   Procedure: INJECTION OF SILICONE OIL;  Surgeon: Jalene Mullet, MD;  Location: [place];  Service: Ophthalmology;  Laterality: Right;   INJECTION OF SILICONE OIL Right 3/81/0175   Procedure: INJECTION OF SILICONE OIL;  Surgeon: Jalene Mullet, MD;  Location: [place];  Service: Ophthalmology;  Laterality: Right;   INSERT / REPLACE / REMOVE PACEMAKER     MEMBRANE PEEL Right 08/25/2020   Procedure: MEMBRANE PEEL;  Surgeon: Jalene Mullet, MD;  Location: [place];  Service: Ophthalmology;  Laterality: Right;   PARS PLANA VITRECTOMY Right 07/14/2020   Procedure: PARS PLANA VITRECTOMY WITH 25 GAUGE, Membranetomy, drainage of subretinal fluid;  Surgeon: Jalene Mullet, MD;  Location: [place];  Service: Ophthalmology;  Laterality: Right;   PARS PLANA VITRECTOMY Right 08/25/2020   Procedure: PARS PLANA VITRECTOMY WITH 25 GAUGE;  Surgeon: Jalene Mullet, MD;  Location: [place];  Service: Ophthalmology;  Laterality: Right;   PHOTOCOAGULATION WITH LASER Right 07/14/2020   Procedure: PHOTOCOAGULATION WITH LASER;  Surgeon: Jalene Mullet, MD;  Location: [place];  Service:  Ophthalmology;  Laterality: Right;   PHOTOCOAGULATION WITH LASER Right 08/25/2020   Procedure: PHOTOCOAGULATION WITH LASER;  Surgeon: Jalene Mullet, MD;  Location: [place];  Service: Ophthalmology;  Laterality: Right;   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   Procedure: REPAIR OF HEMORRHAGIC DETACHMENT;  Surgeon: Jalene Mullet, MD;  Location: [place];  Service: Ophthalmology;  Laterality: Right;   RIGHT/LEFT HEART CATH AND CORONARY ANGIOGRAPHY N/A 01/31/2017   Procedure: Right/Left Heart Cath and Coronary Angiography;  Surgeon: Troy Sine, MD;  Location: [place] [facility];  Service: Cardiovascular;  Laterality: N/A;   SILICON OIL REMOVAL Right 12/13/5850   Procedure: SILICON OIL REMOVAL;  Surgeon: Jalene Mullet, MD;  Location: [place];  Service: Ophthalmology;  Laterality: Right;       Family History  Problem Relation Age of Onset   Diabetes Mother    Hypertension Mother    Diabetes Father    Heart attack Father    Diabetes Sister    Heart  attack Maternal Grandmother     Social History   Tobacco Use   Smoking status: Never   Smokeless tobacco: Never  Vaping Use   Vaping Use: Never used  Substance Use Topics   Alcohol use: Never   Drug use: Never    Home Medications Prior to Admission medications   Medication Sig Start Date End Date Taking? Authorizing Provider  Accu-Chek Softclix Lancets lancets Use as instructed to check blood sugar TID. E11.65 06/23/20   Charlott Rakes, MD  Accu-Chek Softclix Lancets lancets Use as directed up to 4 times daily 07/02/21   Mariel Aloe, MD  albuterol (PROVENTIL HFA;VENTOLIN HFA) 108 (90 Base) MCG/ACT inhaler Inhale 1-2 puffs into the lungs every 6 (six) hours as needed for wheezing or shortness of breath. 11/15/16   Recardo Evangelist, PA-C  aspirin 81 MG EC tablet Take 1 tablet (81 mg total) by mouth daily. Swallow whole. 07/03/21   Mariel Aloe, MD  benzonatate (TESSALON) 100 MG capsule TAKE 1 CAPSULE BY MOUTH  EVERY 8 HOURS Patient not taking: Reported on 07/28/2021 12/22/20 12/22/21  Henderly, Britni A, PA-C  Blood Glucose Monitoring Suppl (ACCU-CHEK GUIDE) w/Device KIT Use as directed 07/02/21   Mariel Aloe, MD  Blood Glucose Monitoring Suppl (TRUE METRIX METER) DEVI Check blood sugar three times / day, before meals. 06/18/20   Charlott Rakes, MD  carvedilol (COREG) 12.5 MG tablet TAKE 1 TABLET (12.5 MG TOTAL) BY MOUTH 2 (TWO) TIMES DAILY WITH A MEAL. 03/11/21 03/11/22  Shirley Friar, PA-C  cetirizine (ZYRTEC) 10 MG tablet TAKE 1 TABLET (10 MG TOTAL) BY MOUTH DAILY. Patient taking differently: Take 10 mg by mouth daily as needed for allergies. 07/27/20   Charlott Rakes, MD  ferrous sulfate 325 (65 FE) MG tablet Take 1 tablet (325 mg total) by mouth 2 (two) times daily with a meal. Patient not taking: Reported on 07/28/2021 04/03/16   Dhungel, Flonnie Overman, MD  furosemide (LASIX) 40 MG tablet TAKE 1.5 TABLETS (60 MG TOTAL) BY MOUTH DAILY. Patient taking differently: Take 60 mg by mouth daily. 01/13/21 01/13/22  Charlott Rakes, MD  gabapentin (NEURONTIN) 400 MG capsule TAKE 1 CAPSULE BY MOUTH 3 TAKE TIMES DAILY. 01/13/21 01/13/22  Charlott Rakes, MD  glucose blood (ACCU-CHEK GUIDE) test strip Use as instructed to check blood sugar TID. E11.65 06/23/20   Charlott Rakes, MD  glucose blood (ACCU-CHEK GUIDE) test strip Use as instructed up to 4 times daily 07/02/21   Mariel Aloe, MD  hydrALAZINE (APRESOLINE) 25 MG tablet TAKE 1 TABLET (25 MG TOTAL) BY MOUTH 3 (THREE) TIMES DAILY. 01/27/21 01/27/22  Shirley Friar, PA-C  insulin aspart (NOVOLOG) 100 UNIT/ML injection INJECT 0-15 UNITS INTO THE SKIN 3 (THREE) TIMES DAILY WITH MEALS. SLIDING SCALE CBG 70 - 120: 0 UNITS: CBG 121 - 140: 2 UNITS; CBG 140 - 200:8 UNITS Patient not taking: Reported on 07/28/2021 04/28/21   Charlott Rakes, MD  insulin glargine (LANTUS) 100 UNIT/ML Solostar Pen INJECT 60 UNITS INTO THE SKIN DAILY. Patient not taking: Reported on  07/28/2021 01/13/21 01/13/22  Charlott Rakes, MD  Insulin Pen Needle (BD PEN NEEDLE NANO U/F) 32G X 4 MM MISC USE TO INJECT LANTUS DAILY. MUST USE NEW PEN NEEDLE WITH EACH INJECTION. 07/27/21   Charlott Rakes, MD  isosorbide mononitrate (IMDUR) 30 MG 24 hr tablet Take 1 tablet (30 mg total) by mouth daily. 01/27/21   Shirley Friar, PA-C  LANTUS SOLOSTAR 100 UNIT/ML Solostar Pen Inject 40 Units into  the skin at bedtime. 07/02/21   Mariel Aloe, MD  NOVOLOG 100 UNIT/ML injection Inject 0-15 Units into the skin 3 (three) times daily with meals. Per sliding scale 04/02/21   [provider]  ondansetron (ZOFRAN) 4 MG tablet Take 1 tablet (4 mg total) by mouth every 6 (six) hours as needed for nausea or vomiting. Patient not taking: Reported on 02/24/1760 60/73/71   Delora Fuel, MD  potassium chloride (KLOR-CON) 10 MEQ tablet TAKE 1 TABLET (10 MEQ TOTAL) BY MOUTH DAILY. Patient not taking: Reported on 07/28/2021 01/13/21 01/13/22  Charlott Rakes, MD  rosuvastatin (CRESTOR) 40 MG tablet TAKE 1 TABLET (40 MG TOTAL) BY MOUTH DAILY. 01/13/21 01/13/22  Charlott Rakes, MD  sacubitril-valsartan (ENTRESTO) 97-103 MG TAKE 1 TABLET BY MOUTH 2 (TWO) TIMES DAILY. Patient not taking: Reported on 07/28/2021 03/11/21 03/11/22  Shirley Friar, PA-C  spironolactone (ALDACTONE) 25 MG tablet TAKE 1 TABLET (25 MG TOTAL) BY MOUTH DAILY. Patient not taking: Reported on 07/28/2021 01/13/21 01/13/22  Charlott Rakes, MD    Allergies    Patient has no known allergies.  Review of Systems   Review of Systems  Constitutional:  Positive for decreased responsiveness. Negative for fever.  HENT:  Negative for sore throat.   Eyes:  Negative for visual disturbance.  Respiratory:  Negative for shortness of breath.   Cardiovascular:  Negative for chest pain.  Gastrointestinal:  Negative for abdominal pain.  Genitourinary:  Negative for dysuria.  Musculoskeletal:  Negative for neck pain.  Skin:  Negative for rash.   Neurological:  Negative for headaches.   Physical Exam Updated Vital Signs BP (!) 194/104 (BP Location: Right Arm)   Pulse 78   Temp 98.2 F (36.8 C)   Resp 16   SpO2 98%   Physical Exam Vitals and nursing note reviewed.  Constitutional:      Appearance: Normal appearance. He is well-developed.  HENT:     Head: Normocephalic and atraumatic.  Eyes:     Conjunctiva/sclera: Conjunctivae normal.  Cardiovascular:     Rate and Rhythm: Normal rate and regular rhythm.     Heart sounds: No murmur heard. Pulmonary:     Effort: Pulmonary effort is normal. No respiratory distress.     Breath sounds: Normal breath sounds.  Abdominal:     Palpations: Abdomen is soft.     Tenderness: There is no abdominal tenderness.  Musculoskeletal:        General: Normal range of motion.     Cervical back: Neck supple.     Right lower leg: No edema.     Left lower leg: No edema.  Skin:    General: Skin is warm and dry.  Neurological:     General: No focal deficit present.     Mental Status: He is alert and oriented to person, place, and time.    ED Results / Procedures / Treatments   Labs (all labs ordered are listed, but only abnormal results are displayed) Labs Reviewed  BASIC METABOLIC PANEL - Abnormal; Notable for the following components:      Result Value   Glucose, Bld 161 (*)    Creatinine, Ser 1.76 (*)    GFR, Estimated 45 (*)    All other components within normal limits  CBC - Abnormal; Notable for the following components:   Hemoglobin 12.0 (*)    MCH 24.4 (*)    MCHC 29.8 (*)    All other components within normal limits  CBG MONITORING, ED -  Abnormal; Notable for the following components:   Glucose-Capillary 130 (*)    All other components within normal limits  CBG MONITORING, ED  CBG MONITORING, ED    EKG EKG Interpretation  Date/Time:  Tuesday August 03 2021 13:35:46 EDT Ventricular Rate:  77 PR Interval:  189 QRS Duration: 89 QT Interval:  407 QTC  Calculation: 461 R Axis:   -9 Text Interpretation: Sinus rhythm Nonspecific T abnormalities, lateral leads No significant change since 7/21 Confirmed by Aletta Edouard [phone]) on 08/03/2021 1:42:28 PM  Radiology No results found.  Procedures Procedures   Medications Ordered in ED Medications  sodium chloride flush (NS) 0.9 % injection 3 mL (3 mLs Intravenous Given 08/03/21 1338)  sodium chloride flush (NS) 0.9 % injection 3 mL (has no administration in time range)  0.9 %  sodium chloride infusion (has no administration in time range)  carvedilol (COREG) tablet 12.5 mg (has no administration in time range)  hydrALAZINE (APRESOLINE) tablet 25 mg (25 mg Oral Given 08/03/21 1505)    ED Course  I have reviewed the triage vital signs and the nursing notes.  Pertinent labs & imaging results that were available during my care of the patient were reviewed by me and considered in my medical decision making (see chart for details).  Clinical Course as of 08/03/21 1922  Tue Aug 03, 2021  1421 EKG did not cross in epic.  Normal sinus rhythm rate of 77 normal intervals, nonspecific T wave changes not significantly different from priors. [MB]  8937 Patient blood pressure remains high here.  Patient states has been taking his medications regularly but his mother disagrees and states he is not compliant with medications.  Have ordered a couple of his blood pressure medicines here. [MB]  1438 CKD unchanged [MB]  60 [name] girlfriend is here now and she confirms that the patient is not very reliable with taking his medications.  He has eaten a [place] sandwich and I have ordered some of his blood pressure medicines.  Will discharge after these are administered. [MB]    Clinical Course User Index [MB] Hayden Rasmussen, MD   MDM Rules/Calculators/A&P                          This patient complains of altered mental status low blood sugar; this involves an extensive number of treatment Options and  is a complaint that carries with it a high risk of complications and Morbidity. The differential includes hypoglycemia, medication mismanagement, poor p.o. intake, dehydration  I ordered, reviewed and interpreted labs, which included CBC with normal white count, hemoglobin slightly lower than baseline, chemistries normal other than elevated creatinine similar to priors.  Glucose has remained stable in department I ordered medication patient's oral blood pressure medications  Additional history obtained from EMS and patient's significant other.  It sounds like he has poor compliance with his medications and his diet Previous records obtained and reviewed in epic no recent visits  After the interventions stated above, I reevaluated the patient and found patient remained awake and alert.  He is hypertensive but better than on arrival.  Counseled to take blood pressure medication routinely and to check his blood sugars more frequently.  Return instructions discussed   Final Clinical Impression(s) / ED Diagnoses Final diagnoses:  Hypoglycemia  Primary hypertension    Rx / DC Orders ED Discharge Orders     None        Hayden Rasmussen,  MD 08/03/21 1924

## 2021-08-03 NOTE — ED Triage Notes (Addendum)
Pt BIB [facility] at home. Pt found by girlfriend unresponsive. EMS arrived and found pt's CBG to be 35.Pt was given 25 g of d10 and upon recheck CBG was found to be 123. Pt is diabetic and insulin dependent. Did not eat this AM or do any insulin coverage this AM. Pt is now Aox4. VS per EMS:   BP-198/86 Spo2-98% RA  HR-78  Resp-18

## 2021-08-03 NOTE — Discharge Instructions (Signed)
You were seen in the emergency department for evaluation of low blood sugar.  Your blood sugar stayed stable and your lab work did not show any significant abnormalities.  Your blood pressure was elevated here and this will need to be followed up with your primary care doctor.  Please remember to eat regular meals.  Check your blood sugar more frequently.

## 2021-08-03 NOTE — ED Notes (Signed)
Patient given [place] sandwich and water to eat.

## 2021-08-06 ENCOUNTER — Encounter (HOSPITAL_COMMUNITY): Payer: Self-pay

## 2021-08-06 ENCOUNTER — Observation Stay (HOSPITAL_COMMUNITY)
Admission: EM | Admit: 2021-08-06 | Discharge: 2021-08-07 | Disposition: A | Discharge status: Home / Self Care | Payer: Medicare Other | Attending: Internal Medicine | Admitting: Internal Medicine

## 2021-08-06 ENCOUNTER — Other Ambulatory Visit: Payer: Self-pay

## 2021-08-06 DIAGNOSIS — Z20822 Contact with and (suspected) exposure to covid-19: Secondary | ICD-10-CM | POA: Insufficient documentation

## 2021-08-06 DIAGNOSIS — Z794 Long term (current) use of insulin: Secondary | ICD-10-CM | POA: Diagnosis not present

## 2021-08-06 DIAGNOSIS — I5042 Chronic combined systolic (congestive) and diastolic (congestive) heart failure: Secondary | ICD-10-CM | POA: Diagnosis present

## 2021-08-06 DIAGNOSIS — Z79899 Other long term (current) drug therapy: Secondary | ICD-10-CM | POA: Insufficient documentation

## 2021-08-06 DIAGNOSIS — Z95810 Presence of automatic (implantable) cardiac defibrillator: Secondary | ICD-10-CM | POA: Diagnosis not present

## 2021-08-06 DIAGNOSIS — E876 Hypokalemia: Secondary | ICD-10-CM | POA: Diagnosis present

## 2021-08-06 DIAGNOSIS — N183 Chronic kidney disease, stage 3 unspecified: Secondary | ICD-10-CM | POA: Insufficient documentation

## 2021-08-06 DIAGNOSIS — E11649 Type 2 diabetes mellitus with hypoglycemia without coma: Principal | ICD-10-CM | POA: Insufficient documentation

## 2021-08-06 DIAGNOSIS — E1122 Type 2 diabetes mellitus with diabetic chronic kidney disease: Secondary | ICD-10-CM | POA: Diagnosis not present

## 2021-08-06 DIAGNOSIS — Z7982 Long term (current) use of aspirin: Secondary | ICD-10-CM | POA: Insufficient documentation

## 2021-08-06 DIAGNOSIS — N179 Acute kidney failure, unspecified: Secondary | ICD-10-CM | POA: Diagnosis present

## 2021-08-06 DIAGNOSIS — R410 Disorientation, unspecified: Secondary | ICD-10-CM

## 2021-08-06 DIAGNOSIS — I130 Hypertensive heart and chronic kidney disease with heart failure and stage 1 through stage 4 chronic kidney disease, or unspecified chronic kidney disease: Secondary | ICD-10-CM | POA: Diagnosis not present

## 2021-08-06 DIAGNOSIS — E162 Hypoglycemia, unspecified: Secondary | ICD-10-CM | POA: Diagnosis present

## 2021-08-06 DIAGNOSIS — N1832 Chronic kidney disease, stage 3b: Secondary | ICD-10-CM

## 2021-08-06 DIAGNOSIS — D509 Iron deficiency anemia, unspecified: Secondary | ICD-10-CM | POA: Diagnosis present

## 2021-08-06 DIAGNOSIS — I2510 Atherosclerotic heart disease of native coronary artery without angina pectoris: Secondary | ICD-10-CM | POA: Diagnosis not present

## 2021-08-06 DIAGNOSIS — E1100 Type 2 diabetes mellitus with hyperosmolarity without nonketotic hyperglycemic-hyperosmolar coma (NKHHC): Secondary | ICD-10-CM

## 2021-08-06 DIAGNOSIS — E1165 Type 2 diabetes mellitus with hyperglycemia: Secondary | ICD-10-CM

## 2021-08-06 DIAGNOSIS — I10 Essential (primary) hypertension: Secondary | ICD-10-CM | POA: Diagnosis present

## 2021-08-06 DIAGNOSIS — N184 Chronic kidney disease, stage 4 (severe): Secondary | ICD-10-CM | POA: Diagnosis present

## 2021-08-06 DIAGNOSIS — N049 Nephrotic syndrome with unspecified morphologic changes: Secondary | ICD-10-CM

## 2021-08-06 LAB — I-STAT CHEM 8, ED
BUN: 18 mg/dL (ref 6–20)
Calcium, Ion: 1.2 mmol/L (ref 1.15–1.40)
Chloride: 108 mmol/L (ref 98–111)
Creatinine, Ser: 2 mg/dL — ABNORMAL HIGH (ref 0.61–1.24)
Glucose, Bld: 70 mg/dL (ref 70–99)
HCT: 35 % — ABNORMAL LOW (ref 39.0–52.0)
Hemoglobin: 11.9 g/dL — ABNORMAL LOW (ref 13.0–17.0)
Potassium: 2.5 mmol/L — CL (ref 3.5–5.1)
Sodium: 146 mmol/L — ABNORMAL HIGH (ref 135–145)
TCO2: 24 mmol/L (ref 22–32)

## 2021-08-06 LAB — COMPREHENSIVE METABOLIC PANEL
ALT: 24 U/L (ref 0–44)
AST: 25 U/L (ref 15–41)
Albumin: 2.8 g/dL — ABNORMAL LOW (ref 3.5–5.0)
Alkaline Phosphatase: 59 U/L (ref 38–126)
Anion gap: 8 (ref 5–15)
BUN: 18 mg/dL (ref 6–20)
CO2: 24 mmol/L (ref 22–32)
Calcium: 8.4 mg/dL — ABNORMAL LOW (ref 8.9–10.3)
Chloride: 110 mmol/L (ref 98–111)
Creatinine, Ser: 2.06 mg/dL — ABNORMAL HIGH (ref 0.61–1.24)
GFR, Estimated: 37 mL/min — ABNORMAL LOW (ref >60)
Glucose, Bld: 71 mg/dL (ref 70–99)
Potassium: 2.5 mmol/L — CL (ref 3.5–5.1)
Sodium: 142 mmol/L (ref 135–145)
Total Bilirubin: 0.5 mg/dL (ref 0.3–1.2)
Total Protein: 6.3 g/dL — ABNORMAL LOW (ref 6.5–8.1)

## 2021-08-06 LAB — URINALYSIS, ROUTINE W REFLEX MICROSCOPIC
Bacteria, UA: NONE SEEN
Bilirubin Urine: NEGATIVE
Glucose, UA: NEGATIVE mg/dL
Ketones, ur: NEGATIVE mg/dL
Leukocytes,Ua: NEGATIVE
Nitrite: NEGATIVE
Protein, ur: 100 mg/dL — AB
Specific Gravity, Urine: 1.01 (ref 1.005–1.030)
pH: 5 (ref 5.0–8.0)

## 2021-08-06 LAB — CBC WITH DIFFERENTIAL/PLATELET
Abs Immature Granulocytes: 0.03 10*3/uL (ref 0.00–0.07)
Basophils Absolute: 0 10*3/uL (ref 0.0–0.1)
Basophils Relative: 1 %
Eosinophils Absolute: 0.1 10*3/uL (ref 0.0–0.5)
Eosinophils Relative: 1 %
HCT: 36.3 % — ABNORMAL LOW (ref 39.0–52.0)
Hemoglobin: 10.9 g/dL — ABNORMAL LOW (ref 13.0–17.0)
Immature Granulocytes: 1 %
Lymphocytes Relative: 19 %
Lymphs Abs: 1.1 10*3/uL (ref 0.7–4.0)
MCH: 24.8 pg — ABNORMAL LOW (ref 26.0–34.0)
MCHC: 30 g/dL (ref 30.0–36.0)
MCV: 82.7 fL (ref 80.0–100.0)
Monocytes Absolute: 0.4 10*3/uL (ref 0.1–1.0)
Monocytes Relative: 7 %
Neutro Abs: 4.2 10*3/uL (ref 1.7–7.7)
Neutrophils Relative %: 71 %
Platelets: 276 10*3/uL (ref 150–400)
RBC: 4.39 MIL/uL (ref 4.22–5.81)
RDW: 15.3 % (ref 11.5–15.5)
WBC: 5.9 10*3/uL (ref 4.0–10.5)
nRBC: 0 % (ref 0.0–0.2)

## 2021-08-06 LAB — MAGNESIUM: Magnesium: 1.4 mg/dL — ABNORMAL LOW (ref 1.7–2.4)

## 2021-08-06 LAB — CBG MONITORING, ED
Glucose-Capillary: 70 mg/dL (ref 70–99)
Glucose-Capillary: 84 mg/dL (ref 70–99)
Glucose-Capillary: 73 mg/dL (ref 70–99)
Glucose-Capillary: 73 mg/dL (ref 70–99)
Glucose-Capillary: 111 mg/dL — ABNORMAL HIGH (ref 70–99)

## 2021-08-06 LAB — CREATININE, SERUM
Creatinine, Ser: 1.98 mg/dL — ABNORMAL HIGH (ref 0.61–1.24)
GFR, Estimated: 39 mL/min — ABNORMAL LOW (ref >60)

## 2021-08-06 LAB — GLUCOSE, CAPILLARY: Glucose-Capillary: 168 mg/dL — ABNORMAL HIGH (ref 70–99)

## 2021-08-06 LAB — RESP PANEL BY RT-PCR (FLU A&B, COVID) ARPGX2
Influenza A by PCR: NEGATIVE
Influenza B by PCR: NEGATIVE
SARS Coronavirus 2 by RT PCR: NEGATIVE

## 2021-08-06 LAB — CBC
HCT: 36.4 % — ABNORMAL LOW (ref 39.0–52.0)
Hemoglobin: 11 g/dL — ABNORMAL LOW (ref 13.0–17.0)
MCH: 24.8 pg — ABNORMAL LOW (ref 26.0–34.0)
MCHC: 30.2 g/dL (ref 30.0–36.0)
MCV: 82 fL (ref 80.0–100.0)
Platelets: 270 10*3/uL (ref 150–400)
RBC: 4.44 MIL/uL (ref 4.22–5.81)
RDW: 15.3 % (ref 11.5–15.5)
WBC: 6.2 10*3/uL (ref 4.0–10.5)
nRBC: 0 % (ref 0.0–0.2)

## 2021-08-06 MED ORDER — SODIUM CHLORIDE 0.9 % IV SOLN
250.0000 mL | INTRAVENOUS | Status: DC | PRN
  Administered 2021-08-06: 250 mL via INTRAVENOUS

## 2021-08-06 MED ORDER — ROSUVASTATIN CALCIUM 20 MG PO TABS
40.0000 mg | ORAL_TABLET | Freq: Every day | ORAL | Status: DC
  Administered 2021-08-07: 40 mg via ORAL
  Filled 2021-08-06: qty 2

## 2021-08-06 MED ORDER — POTASSIUM CHLORIDE CRYS ER 20 MEQ PO TBCR
40.0000 meq | EXTENDED_RELEASE_TABLET | Freq: Once | ORAL | Status: AC
  Administered 2021-08-06: 40 meq via ORAL
  Filled 2021-08-06: qty 2

## 2021-08-06 MED ORDER — HEPARIN SODIUM (PORCINE) 5000 UNIT/ML IJ SOLN
5000.0000 [IU] | Freq: Three times a day (TID) | INTRAMUSCULAR | Status: DC
  Administered 2021-08-06 – 2021-08-07 (×2): 5000 [IU] via SUBCUTANEOUS
  Filled 2021-08-06 (×2): qty 1

## 2021-08-06 MED ORDER — SODIUM CHLORIDE 0.9% FLUSH: 3.0000 mL | INTRAVENOUS | Status: DC | PRN

## 2021-08-06 MED ORDER — ACETAMINOPHEN 325 MG PO TABS: 650.0000 mg | ORAL_TABLET | Freq: Four times a day (QID) | ORAL | Status: DC | PRN

## 2021-08-06 MED ORDER — SODIUM CHLORIDE 0.9% FLUSH
3.0000 mL | Freq: Two times a day (BID) | INTRAVENOUS | Status: DC
  Administered 2021-08-06 – 2021-08-07 (×3): 3 mL via INTRAVENOUS

## 2021-08-06 MED ORDER — MAGNESIUM SULFATE IN D5W 1-5 GM/100ML-% IV SOLN
1.0000 g | Freq: Once | INTRAVENOUS | Status: AC
  Administered 2021-08-06: 1 g via INTRAVENOUS
  Filled 2021-08-06: qty 100

## 2021-08-06 MED ORDER — ASPIRIN EC 81 MG PO TBEC
81.0000 mg | DELAYED_RELEASE_TABLET | Freq: Every day | ORAL | Status: DC
  Administered 2021-08-07: 81 mg via ORAL
  Filled 2021-08-06: qty 1

## 2021-08-06 MED ORDER — ACETAMINOPHEN 650 MG RE SUPP: 650.0000 mg | Freq: Four times a day (QID) | RECTAL | Status: DC | PRN

## 2021-08-06 MED ORDER — FUROSEMIDE 40 MG PO TABS
60.0000 mg | ORAL_TABLET | Freq: Every day | ORAL | Status: DC
  Administered 2021-08-07: 60 mg via ORAL
  Filled 2021-08-06: qty 1

## 2021-08-06 MED ORDER — POTASSIUM CHLORIDE 10 MEQ/100ML IV SOLN
10.0000 meq | INTRAVENOUS | Status: AC
  Administered 2021-08-06 (×2): 10 meq via INTRAVENOUS
  Filled 2021-08-06 (×2): qty 100

## 2021-08-06 MED ORDER — GABAPENTIN 300 MG PO CAPS
300.0000 mg | ORAL_CAPSULE | Freq: Three times a day (TID) | ORAL | Status: DC
  Administered 2021-08-07: 300 mg via ORAL
  Filled 2021-08-06: qty 1

## 2021-08-06 MED ORDER — HYDRALAZINE HCL 25 MG PO TABS
25.0000 mg | ORAL_TABLET | Freq: Three times a day (TID) | ORAL | Status: DC
  Administered 2021-08-06 – 2021-08-07 (×2): 25 mg via ORAL
  Filled 2021-08-06 (×2): qty 1

## 2021-08-06 MED ORDER — CARVEDILOL 12.5 MG PO TABS
12.5000 mg | ORAL_TABLET | Freq: Two times a day (BID) | ORAL | Status: DC
  Administered 2021-08-07: 12.5 mg via ORAL
  Filled 2021-08-06: qty 1

## 2021-08-06 MED ORDER — ISOSORBIDE MONONITRATE ER 30 MG PO TB24
30.0000 mg | ORAL_TABLET | Freq: Every day | ORAL | Status: DC
  Administered 2021-08-07: 30 mg via ORAL
  Filled 2021-08-06: qty 1

## 2021-08-06 NOTE — ED Provider Notes (Signed)
[facility] EMERGENCY DEPARTMENT Provider Note   CSN: [id number] Arrival date & time: 08/06/21  1732     History Chief Complaint  Patient presents with   Hypoglycemia    Jeffrey [name] is a 55 y.o. male.  HPI  55 year old male with a past medical history of CAD, CHF, diabetes on insulin, hypertension presenting to the emergency department with EMS for hypoglycemia.  EMS reports they were called out by the patient's significant other as they found him unresponsive on the couch.  When EMS arrived, the patient's blood sugar was 30.  They administered 25 g of glucose via a did D10 bolus as blood sugar improved to 135.  They state that his mental status greatly improved at that time and he was able to tell them that he was in his otherwise normal state health this morning.  They state that his blood sugar continued to trend down to 88 and then 78, so they initiated a D10 infusion that is still running on their arrival to the emergency department.  Speak with the patient, he states that he was in his normal state of health last night and this morning.  He states that he takes 30 units of long-acting insulin and does not take any other hypoglycemic agents.  He denies any recent nausea, vomiting, diarrhea, fever.  Past Medical History:  Diagnosis Date   AICD (automatic cardioverter/defibrillator) present    [employer]   AICD (automatic cardioverter/defibrillator) present    MDT Visia AF MRI   Anemia    CAD (coronary artery disease)    a. cath 01/31/17: 60% 1st RPLB, 60% dist RCA, 55% prox RCA, 10% pro LAD --> Rx TX.    CHF (congestive heart failure) (HCC)    Chronic systolic CHF (congestive heart failure) ([place]) 01/28/2017   1. Echo 01/29/17:  EF 20-25, normal wall motion, mild LAE // 2. EF 10-15 by [facility] 01/2017    Coronary artery disease    Diabetes mellitus    type II   Diabetes mellitus without complication ([place])    Diabetic foot infection ([place]) 03/2016   RT FOOT    Dyspnea    History of kidney stones    passed   History of kidney stones    HTN (hypertension)    Hyperlipidemia    Hypertension    Myocardial infarction [facility]), although reported, NICM at cath    NICM (nonischemic cardiomyopathy) ([place]) 02/15/2017   1. Mod non-obs CAD on LHC in 01/2017 - CAD does not explain cardiomyopathy   Renal disorder     Patient Active Problem List   Diagnosis Date Noted   Elevated troponin 07/01/2021   Seizure ([place]) 07/01/2021   Acute stroke due to ischemia [facility])    Hyperglycemia 06/30/2021   Prolonged QT interval 06/30/2021   Acute on chronic renal failure ([place]) 06/30/2021   Hypokalemia 06/30/2021   Hyperosmolar hyperglycemic state (HHS) ([place]) 00/86/7619   Chronic systolic CHF (congestive heart failure) ([place]) 06/30/2021   Essential hypertension 06/30/2021   Uncontrolled type 2 DM with hyperosmolar nonketotic hyperglycemia ([place]) 06/30/2021   Anemia due to chronic kidney disease 06/30/2021   CKD (chronic kidney disease), stage III ([place]) 06/30/2021   CAD (coronary artery disease) 06/30/2021   Fall due to seizure ([place]) 06/30/2021   Nephrotic syndrome    Hyperglycemia due to diabetes mellitus ([place]) 06/12/2020   Hypoglycemia 50/93/2671   Acute metabolic encephalopathy 24/58/0998   S/P ICD (internal cardiac defibrillator) procedure 01/19/2018   Cardiac device in  situ    Syncope and collapse 01/13/2018   NICM (nonischemic cardiomyopathy) ([place]) 02/15/2017   Coronary artery disease involving native coronary artery of native heart without angina pectoris 02/15/2017   Hyperlipidemia 02/15/2017   Chronic combined systolic (congestive) and diastolic (congestive) heart failure ([place]) 19/37/9024   Folliculitis 09/73/5329   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 ([place]) 92/42/6834   Anemia, iron deficiency    Noncompliance with medication regimen 03/30/2016   Uncontrolled type 2 diabetes mellitus with hyperglycemia, with long-term current use of  insulin ([place])    Cellulitis 09/25/2015   Essential hypertension 08/21/2013   Diabetic neuropathy ([place]) 08/21/2013   Fungal toenail infection 08/21/2013    Past Surgical History:  Procedure Laterality Date   AIR/FLUID EXCHANGE Right 07/14/2020   Procedure: AIR/FLUID EXCHANGE;  Surgeon: Jalene Mullet, MD;  Location: [place];  Service: Ophthalmology;  Laterality: Right;   AMPUTATION Right 04/01/2016   Procedure: Right Great Toe Amputation;  Surgeon: Newt Minion, MD;  Location: [place];  Service: Orthopedics;  Laterality: Right;   AMPUTATION Right 06/19/2016   Procedure: AMPUTATION SECOND TOE;  Surgeon: Marybelle Killings, MD;  Location: [place];  Service: Orthopedics;  Laterality: Right;   BACK SURGERY     for abscess   CARDIAC CATHETERIZATION     ICD IMPLANT N/A 01/15/2018   Procedure: ICD IMPLANT;  Surgeon: Deboraha Sprang, MD;  Location: [place] [facility];  Service: Cardiovascular;  Laterality: N/A;   INJECTION OF SILICONE OIL Right 12/20/6220   Procedure: INJECTION OF SILICONE OIL;  Surgeon: Jalene Mullet, MD;  Location: [place];  Service: Ophthalmology;  Laterality: Right;   INJECTION OF SILICONE OIL Right 9/79/8921   Procedure: INJECTION OF SILICONE OIL;  Surgeon: Jalene Mullet, MD;  Location: [place];  Service: Ophthalmology;  Laterality: Right;   INSERT / REPLACE / REMOVE PACEMAKER     MEMBRANE PEEL Right 08/25/2020   Procedure: MEMBRANE PEEL;  Surgeon: Jalene Mullet, MD;  Location: [place];  Service: Ophthalmology;  Laterality: Right;   PARS PLANA VITRECTOMY Right 07/14/2020   Procedure: PARS PLANA VITRECTOMY WITH 25 GAUGE, Membranetomy, drainage of subretinal fluid;  Surgeon: Jalene Mullet, MD;  Location: [place];  Service: Ophthalmology;  Laterality: Right;   PARS PLANA VITRECTOMY Right 08/25/2020   Procedure: PARS PLANA VITRECTOMY WITH 25 GAUGE;  Surgeon: Jalene Mullet, MD;  Location: [place];  Service: Ophthalmology;  Laterality: Right;   PHOTOCOAGULATION WITH LASER Right 07/14/2020   Procedure:  PHOTOCOAGULATION WITH LASER;  Surgeon: Jalene Mullet, MD;  Location: [place];  Service: Ophthalmology;  Laterality: Right;   PHOTOCOAGULATION WITH LASER Right 08/25/2020   Procedure: PHOTOCOAGULATION WITH LASER;  Surgeon: Jalene Mullet, MD;  Location: [place];  Service: Ophthalmology;  Laterality: Right;   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   Procedure: REPAIR OF HEMORRHAGIC DETACHMENT;  Surgeon: Jalene Mullet, MD;  Location: [place];  Service: Ophthalmology;  Laterality: Right;   RIGHT/LEFT HEART CATH AND CORONARY ANGIOGRAPHY N/A 01/31/2017   Procedure: Right/Left Heart Cath and Coronary Angiography;  Surgeon: Troy Sine, MD;  Location: [place] [facility];  Service: Cardiovascular;  Laterality: N/A;   SILICON OIL REMOVAL Right 1/94/1740   Procedure: SILICON OIL REMOVAL;  Surgeon: Jalene Mullet, MD;  Location: [place];  Service: Ophthalmology;  Laterality: Right;       Family History  Problem Relation Age of Onset   Diabetes Mother    Hypertension Mother    Diabetes Father  Heart attack Father    Diabetes Sister    Heart attack Maternal Grandmother     Social History   Tobacco Use   Smoking status: Never   Smokeless tobacco: Never  Vaping Use   Vaping Use: Never used  Substance Use Topics   Alcohol use: Never   Drug use: Never    Home Medications Prior to Admission medications   Medication Sig Start Date End Date Taking? Authorizing Provider  albuterol (PROVENTIL HFA;VENTOLIN HFA) 108 (90 Base) MCG/ACT inhaler Inhale 1-2 puffs into the lungs every 6 (six) hours as needed for wheezing or shortness of breath. 11/15/16  Yes Recardo Evangelist, PA-C  aspirin 81 MG EC tablet Take 1 tablet (81 mg total) by mouth daily. Swallow whole. 07/03/21  Yes Mariel Aloe, MD  carvedilol (COREG) 12.5 MG tablet TAKE 1 TABLET (12.5 MG TOTAL) BY MOUTH 2 (TWO) TIMES DAILY WITH A MEAL. 03/11/21 03/11/22 Yes Tillery, Satira Mccallum, PA-C  cetirizine (ZYRTEC) 10 MG  tablet TAKE 1 TABLET (10 MG TOTAL) BY MOUTH DAILY. Patient taking differently: Take 10 mg by mouth daily as needed for allergies. 07/27/20  Yes Newlin, Charlane Ferretti, MD  furosemide (LASIX) 40 MG tablet TAKE 1.5 TABLETS (60 MG TOTAL) BY MOUTH DAILY. Patient taking differently: Take 60 mg by mouth daily. 01/13/21 01/13/22 Yes Charlott Rakes, MD  gabapentin (NEURONTIN) 400 MG capsule TAKE 1 CAPSULE BY MOUTH 3 TAKE TIMES DAILY. 01/13/21 01/13/22 Yes Newlin, Charlane Ferretti, MD  hydrALAZINE (APRESOLINE) 25 MG tablet TAKE 1 TABLET (25 MG TOTAL) BY MOUTH 3 (THREE) TIMES DAILY. 01/27/21 01/27/22 Yes Tillery, Satira Mccallum, PA-C  isosorbide mononitrate (IMDUR) 30 MG 24 hr tablet Take 1 tablet (30 mg total) by mouth daily. 01/27/21  Yes Shirley Friar, PA-C  LANTUS SOLOSTAR 100 UNIT/ML Solostar Pen Inject 40 Units into the skin at bedtime. 07/02/21  Yes Mariel Aloe, MD  NOVOLOG 100 UNIT/ML injection Inject 0-15 Units into the skin 3 (three) times daily with meals. Per sliding scale 04/02/21  Yes [provider]  rosuvastatin (CRESTOR) 40 MG tablet TAKE 1 TABLET (40 MG TOTAL) BY MOUTH DAILY. 01/13/21 01/13/22 Yes Charlott Rakes, MD  Accu-Chek Softclix Lancets lancets Use as instructed to check blood sugar TID. E11.65 06/23/20   Charlott Rakes, MD  Accu-Chek Softclix Lancets lancets Use as directed up to 4 times daily 07/02/21   Mariel Aloe, MD  benzonatate (TESSALON) 100 MG capsule TAKE 1 CAPSULE BY MOUTH EVERY 8 HOURS Patient not taking: No sig reported 12/22/20 12/22/21  Henderly, Britni A, PA-C  Blood Glucose Monitoring Suppl (ACCU-CHEK GUIDE) w/Device KIT Use as directed 07/02/21   Mariel Aloe, MD  Blood Glucose Monitoring Suppl (TRUE METRIX METER) DEVI Check blood sugar three times / day, before meals. 06/18/20   Charlott Rakes, MD  ferrous sulfate 325 (65 FE) MG tablet Take 1 tablet (325 mg total) by mouth 2 (two) times daily with a meal. Patient not taking: No sig reported 04/03/16   Dhungel, Nishant, MD   glucose blood (ACCU-CHEK GUIDE) test strip Use as instructed to check blood sugar TID. E11.65 06/23/20   Charlott Rakes, MD  glucose blood (ACCU-CHEK GUIDE) test strip Use as instructed up to 4 times daily 07/02/21   Mariel Aloe, MD  insulin aspart (NOVOLOG) 100 UNIT/ML injection INJECT 0-15 UNITS INTO THE SKIN 3 (THREE) TIMES DAILY WITH MEALS. SLIDING SCALE CBG 70 - 120: 0 UNITS: CBG 121 - 140: 2 UNITS; CBG 140 - 200:8 UNITS Patient not  taking: No sig reported 04/28/21   Charlott Rakes, MD  insulin glargine (LANTUS) 100 UNIT/ML Solostar Pen INJECT 60 UNITS INTO THE SKIN DAILY. Patient not taking: No sig reported 01/13/21 01/13/22  Charlott Rakes, MD  Insulin Pen Needle (BD PEN NEEDLE NANO U/F) 32G X 4 MM MISC USE TO INJECT LANTUS DAILY. MUST USE NEW PEN NEEDLE WITH EACH INJECTION. 07/27/21   Charlott Rakes, MD  ondansetron (ZOFRAN) 4 MG tablet Take 1 tablet (4 mg total) by mouth every 6 (six) hours as needed for nausea or vomiting. Patient not taking: No sig reported 81/77/11   Delora Fuel, MD  potassium chloride (KLOR-CON) 10 MEQ tablet TAKE 1 TABLET (10 MEQ TOTAL) BY MOUTH DAILY. Patient not taking: No sig reported 01/13/21 01/13/22  Charlott Rakes, MD  sacubitril-valsartan (ENTRESTO) 97-103 MG TAKE 1 TABLET BY MOUTH 2 (TWO) TIMES DAILY. Patient not taking: No sig reported 03/11/21 03/11/22  Shirley Friar, PA-C  spironolactone (ALDACTONE) 25 MG tablet TAKE 1 TABLET (25 MG TOTAL) BY MOUTH DAILY. Patient not taking: No sig reported 01/13/21 01/13/22  Charlott Rakes, MD    Allergies    Patient has no known allergies.  Review of Systems   Review of Systems  Constitutional:  Positive for fatigue. Negative for chills and fever.  HENT:  Negative for ear pain and sore throat.   Eyes:  Negative for pain and visual disturbance.  Respiratory:  Negative for cough and shortness of breath.   Cardiovascular:  Negative for chest pain and palpitations.  Gastrointestinal:  Negative for abdominal  pain and vomiting.  Genitourinary:  Negative for dysuria and hematuria.  Musculoskeletal:  Negative for arthralgias and back pain.  Skin:  Negative for color change and rash.  Neurological:  Negative for seizures and syncope.  All other systems reviewed and are negative.  Physical Exam Updated Vital Signs BP (!) 149/89 (BP Location: Right Arm)   Pulse 74   Temp 97.9 F (36.6 C) (Oral)   Resp 19   Ht _0  (1.702 m)   Wt 98 kg   SpO2 98%   BMI 33.83 kg/m   Physical Exam Vitals and nursing note reviewed.  Constitutional:      General: He is not in acute distress.    Appearance: He is well-developed. He is not ill-appearing or toxic-appearing.     Comments: Somnolent but wakes easily  HENT:     Head: Normocephalic and atraumatic.  Eyes:     Conjunctiva/sclera: Conjunctivae normal.  Cardiovascular:     Rate and Rhythm: Normal rate and regular rhythm.  Pulmonary:     Effort: Pulmonary effort is normal. No respiratory distress.     Breath sounds: Normal breath sounds. No stridor. No wheezing, rhonchi or rales.  Abdominal:     Palpations: Abdomen is soft.     Tenderness: There is no abdominal tenderness. There is no guarding or rebound.  Musculoskeletal:     Cervical back: Neck supple.  Skin:    General: Skin is warm and dry.     Capillary Refill: Capillary refill takes less than 2 seconds.  Neurological:     Mental Status: He is oriented to person, place, and time.     Sensory: No sensory deficit.     Motor: No weakness.    ED Results / Procedures / Treatments   Labs (all labs ordered are listed, but only abnormal results are displayed) Labs Reviewed  CBC WITH DIFFERENTIAL/PLATELET - Abnormal; Notable for the following components:  Result Value   Hemoglobin 10.9 (*)    HCT 36.3 (*)    MCH 24.8 (*)    All other components within normal limits  COMPREHENSIVE METABOLIC PANEL - Abnormal; Notable for the following components:   Potassium 2.5 (*)    Creatinine,  Ser 2.06 (*)    Calcium 8.4 (*)    Total Protein 6.3 (*)    Albumin 2.8 (*)    GFR, Estimated 37 (*)    All other components within normal limits  MAGNESIUM - Abnormal; Notable for the following components:   Magnesium 1.4 (*)    All other components within normal limits  I-STAT CHEM 8, ED - Abnormal; Notable for the following components:   Sodium 146 (*)    Potassium 2.5 (*)    Creatinine, Ser 2.00 (*)    Hemoglobin 11.9 (*)    HCT 35.0 (*)    All other components within normal limits  RESP PANEL BY RT-PCR (FLU A&B, COVID) [id number]  SARS CORONAVIRUS 2 (TAT 6-24 HRS)  URINALYSIS, ROUTINE W REFLEX MICROSCOPIC  CBC  CREATININE, SERUM  BASIC METABOLIC PANEL  CBC  CBG MONITORING, ED  CBG MONITORING, ED  CBG MONITORING, ED    EKG None  Radiology No results found.  Procedures Procedures   Medications Ordered in ED Medications  sodium chloride flush (NS) 0.9 % injection 3 mL (has no administration in time range)  potassium chloride 10 mEq in 100 mL IVPB (10 mEq Intravenous New Bag/Given 08/06/21 1928)  magnesium sulfate IVPB 1 g 100 mL (has no administration in time range)  aspirin EC tablet 81 mg (has no administration in time range)  furosemide (LASIX) tablet 60 mg (has no administration in time range)  carvedilol (COREG) tablet 12.5 mg (has no administration in time range)  isosorbide mononitrate (IMDUR) 24 hr tablet 30 mg (has no administration in time range)  hydrALAZINE (APRESOLINE) tablet 25 mg (has no administration in time range)  rosuvastatin (CRESTOR) tablet 40 mg (has no administration in time range)  gabapentin (NEURONTIN) capsule 300 mg (has no administration in time range)  heparin injection 5,000 Units (has no administration in time range)  acetaminophen (TYLENOL) tablet 650 mg (has no administration in time range)    Or  acetaminophen (TYLENOL) suppository 650 mg (has no administration in time range)  potassium chloride SA (KLOR-CON) CR tablet 40 mEq (has  no administration in time range)    ED Course  I have reviewed the triage vital signs and the nursing notes.  Pertinent labs & imaging results that were available during my care of the patient were reviewed by me and considered in my medical decision making (see chart for details).    MDM Rules/Calculators/A&P                           55 year old male with above past medical history presenting to the urgency department with altered mental status in the setting of hyperglycemia.  Vital signs reviewed on arrival, within acceptable limits.  Blood sugar checked at 84.  Differential for the patient's hyperglycemia includes medication nonadherence, lack of p.o. intake, accidental insulin administration.  Patient denies any suicidal intent today.  He does not appear septic based on his vital signs and my interview with the patient.  We will obtain a metabolic panel to evaluate his kidney function.  We will continue to get hourly blood sugar checks.  Given that the patient is currently awake, will see  if he will tolerate p.o. intake.  Patient tolerating p.o.  Blood work returned notable for a potassium of 2.5 and a magnesium of 1.4.  Creatinine of 2, increased from several days ago.  The patient's hypokalemia is likely due to significant shifting from insulin administration, so we will gently replete with 2 rounds of 10 mEq of potassium.  Patient significant other bedside reported that the patient is unable to see the insulin pen and is not supposed to administer by himself, but he does say when she believes this is how his blood sugar and so below.  Given his significant hypokalemia, believe that he warrants inpatient admission for observation, potassium repletion, electrolyte trending, and diabetes education.  Handoff given to the admitting team.  Final Clinical Impression(s) / ED Diagnoses Final diagnoses:  Hypoglycemia  Hypokalemia  Confusion    Rx / DC Orders ED Discharge Orders     None         Claud Kelp, MD 08/06/21 2025    Isla Pence, MD 08/06/21 2038

## 2021-08-06 NOTE — ED Triage Notes (Addendum)
Pt from home BIB EMS for low BGL. Pt found diaphoretic and slumped over by sister. Hx of diabetes. BGL 30 with EMS. Pt given a total of 25 gm D10 and D10 infusion. BGL last taken PTA was 85. Pt incontinent on arrival. Pt took an unknown amount of insulin today. Pt reports he takes Lantus and Novolog, however is unable to report doses.

## 2021-08-06 NOTE — Plan of Care (Signed)

## 2021-08-06 NOTE — H&P (Signed)
History and Physical    Jeffrey [name] RSW:[id number] DOB: 05-31-1966 DOA: 08/06/2021  PCP: Charlott Rakes, MD  Patient coming from: Home.  History obtained from patient's wife.  Chief Complaint: Low blood sugar.  HPI: [name] is a 55 y.o. male with history of nonischemic cardiomyopathy status post AICD placement, chronic kidney disease stage III, diabetes mellitus, anemia, hypertension was admitted last month for hyperosmolar status when patient also was found to have seizure-like activity is being brought to the ER for the second time in the last 3 days for hypoglycemic episode.  Patient was originally scheduled to have an eye surgery 3 days ago but was found to be hypoglycemic and the surgery was canceled.  Patient was brought to the ER and was optimized and discharged home.  This evening patient was found to be unresponsive by patient's family and EMS was called and patient blood sugar was found to be in the 20s.  Patient was given D50 and few minutes later patient became more alert but still confused.  ED Course: In the ER patient became more alert but blood sugar was still in the 60s and 70s.  Labs show worsening creatinine from 1.7-2.06 potassium of 2.5 admission 1.4.  Given the patient's blood sugar still in the low range.  Hypokalemia worsening renal function hypomagnesemia patient admitted for further observation.  Patient was given magnesium replacement potassium replacement.  On exam patient does have significant lower extremity edema.  But not short of breath.  COVID test was negative.  Review of Systems: As per HPI, rest all negative.   Past Medical History:  Diagnosis Date   AICD (automatic cardioverter/defibrillator) present    [employer]   AICD (automatic cardioverter/defibrillator) present    MDT [facility] AF MRI   Anemia    CAD (coronary artery disease)    a. cath 01/31/17: 60% 1st RPLB, 60% dist RCA, 55% prox RCA, 10% pro LAD --> Rx TX.    CHF (congestive  heart failure) (HCC)    Chronic systolic CHF (congestive heart failure) ([place]) 01/28/2017   1. Echo 01/29/17:  EF 20-25, normal wall motion, mild LAE // 2. EF 10-15 by [facility] 01/2017    Coronary artery disease    Diabetes mellitus    type II   Diabetes mellitus without complication ([place])    Diabetic foot infection ([place]) 03/2016   RT FOOT   Dyspnea    History of kidney stones    passed   History of kidney stones    HTN (hypertension)    Hyperlipidemia    Hypertension    Myocardial infarction [facility]), although reported, NICM at cath    NICM (nonischemic cardiomyopathy) ([place]) 02/15/2017   1. Mod non-obs CAD on LHC in 01/2017 - CAD does not explain cardiomyopathy   Renal disorder     Past Surgical History:  Procedure Laterality Date   AIR/FLUID EXCHANGE Right 07/14/2020   Procedure: AIR/FLUID EXCHANGE;  Surgeon: Jalene Mullet, MD;  Location: [place];  Service: Ophthalmology;  Laterality: Right;   AMPUTATION Right 04/01/2016   Procedure: Right Great Toe Amputation;  Surgeon: Newt Minion, MD;  Location: [place];  Service: Orthopedics;  Laterality: Right;   AMPUTATION Right 06/19/2016   Procedure: AMPUTATION SECOND TOE;  Surgeon: Marybelle Killings, MD;  Location: [place];  Service: Orthopedics;  Laterality: Right;   BACK SURGERY     for abscess   CARDIAC CATHETERIZATION     ICD IMPLANT N/A 01/15/2018   Procedure: ICD IMPLANT;  Surgeon: Deboraha Sprang, MD;  Location: [place] [facility];  Service: Cardiovascular;  Laterality: N/A;   INJECTION OF SILICONE OIL Right 06/12/9020   Procedure: INJECTION OF SILICONE OIL;  Surgeon: Jalene Mullet, MD;  Location: [place];  Service: Ophthalmology;  Laterality: Right;   INJECTION OF SILICONE OIL Right 12/26/5206   Procedure: INJECTION OF SILICONE OIL;  Surgeon: Jalene Mullet, MD;  Location: [place];  Service: Ophthalmology;  Laterality: Right;   INSERT / REPLACE / REMOVE PACEMAKER     MEMBRANE PEEL Right 08/25/2020   Procedure: MEMBRANE PEEL;  Surgeon: Jalene Mullet,  MD;  Location: [place];  Service: Ophthalmology;  Laterality: Right;   PARS PLANA VITRECTOMY Right 07/14/2020   Procedure: PARS PLANA VITRECTOMY WITH 25 GAUGE, Membranetomy, drainage of subretinal fluid;  Surgeon: Jalene Mullet, MD;  Location: [place];  Service: Ophthalmology;  Laterality: Right;   PARS PLANA VITRECTOMY Right 08/25/2020   Procedure: PARS PLANA VITRECTOMY WITH 25 GAUGE;  Surgeon: Jalene Mullet, MD;  Location: [place];  Service: Ophthalmology;  Laterality: Right;   PHOTOCOAGULATION WITH LASER Right 07/14/2020   Procedure: PHOTOCOAGULATION WITH LASER;  Surgeon: Jalene Mullet, MD;  Location: [place];  Service: Ophthalmology;  Laterality: Right;   PHOTOCOAGULATION WITH LASER Right 08/25/2020   Procedure: PHOTOCOAGULATION WITH LASER;  Surgeon: Jalene Mullet, MD;  Location: [place];  Service: Ophthalmology;  Laterality: Right;   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   Procedure: REPAIR OF HEMORRHAGIC DETACHMENT;  Surgeon: Jalene Mullet, MD;  Location: [place];  Service: Ophthalmology;  Laterality: Right;   RIGHT/LEFT HEART CATH AND CORONARY ANGIOGRAPHY N/A 01/31/2017   Procedure: Right/Left Heart Cath and Coronary Angiography;  Surgeon: Troy Sine, MD;  Location: [place] [facility];  Service: Cardiovascular;  Laterality: N/A;   SILICON OIL REMOVAL Right 0/22/3361   Procedure: SILICON OIL REMOVAL;  Surgeon: Jalene Mullet, MD;  Location: [place];  Service: Ophthalmology;  Laterality: Right;     reports that he has never smoked. He has never used smokeless tobacco. He reports that he does not drink alcohol and does not use drugs.  No Known Allergies  Family History  Problem Relation Age of Onset   Diabetes Mother    Hypertension Mother    Diabetes Father    Heart attack Father    Diabetes Sister    Heart attack Maternal Grandmother     Prior to Admission medications   Medication Sig Start Date End Date Taking? Authorizing Provider  albuterol (PROVENTIL  HFA;VENTOLIN HFA) 108 (90 Base) MCG/ACT inhaler Inhale 1-2 puffs into the lungs every 6 (six) hours as needed for wheezing or shortness of breath. 11/15/16  Yes Recardo Evangelist, PA-C  aspirin 81 MG EC tablet Take 1 tablet (81 mg total) by mouth daily. Swallow whole. 07/03/21  Yes Mariel Aloe, MD  carvedilol (COREG) 12.5 MG tablet TAKE 1 TABLET (12.5 MG TOTAL) BY MOUTH 2 (TWO) TIMES DAILY WITH A MEAL. 03/11/21 03/11/22 Yes Tillery, Satira Mccallum, PA-C  cetirizine (ZYRTEC) 10 MG tablet TAKE 1 TABLET (10 MG TOTAL) BY MOUTH DAILY. Patient taking differently: Take 10 mg by mouth daily as needed for allergies. 07/27/20  Yes Newlin, Charlane Ferretti, MD  furosemide (LASIX) 40 MG tablet TAKE 1.5 TABLETS (60 MG TOTAL) BY MOUTH DAILY. Patient taking differently: Take 60 mg by mouth daily. 01/13/21 01/13/22 Yes Charlott Rakes, MD  gabapentin (NEURONTIN) 400 MG capsule TAKE 1 CAPSULE BY MOUTH 3 TAKE TIMES DAILY. 01/13/21 01/13/22 Yes Charlott Rakes, MD  hydrALAZINE (APRESOLINE) 25 MG tablet TAKE 1 TABLET (25 MG TOTAL) BY MOUTH 3 (THREE) TIMES DAILY. 01/27/21 01/27/22 Yes Tillery, Satira Mccallum, PA-C  isosorbide mononitrate (IMDUR) 30 MG 24 hr tablet Take 1 tablet (30 mg total) by mouth daily. 01/27/21  Yes Shirley Friar, PA-C  LANTUS SOLOSTAR 100 UNIT/ML Solostar Pen Inject 40 Units into the skin at bedtime. 07/02/21  Yes Mariel Aloe, MD  NOVOLOG 100 UNIT/ML injection Inject 0-15 Units into the skin 3 (three) times daily with meals. Per sliding scale 04/02/21  Yes [provider]  rosuvastatin (CRESTOR) 40 MG tablet TAKE 1 TABLET (40 MG TOTAL) BY MOUTH DAILY. 01/13/21 01/13/22 Yes Charlott Rakes, MD  Accu-Chek Softclix Lancets lancets Use as instructed to check blood sugar TID. E11.65 06/23/20   Charlott Rakes, MD  Accu-Chek Softclix Lancets lancets Use as directed up to 4 times daily 07/02/21   Mariel Aloe, MD  benzonatate (TESSALON) 100 MG capsule TAKE 1 CAPSULE BY MOUTH EVERY 8 HOURS Patient not  taking: No sig reported 12/22/20 12/22/21  Henderly, Britni A, PA-C  Blood Glucose Monitoring Suppl (ACCU-CHEK GUIDE) w/Device KIT Use as directed 07/02/21   Mariel Aloe, MD  Blood Glucose Monitoring Suppl (TRUE METRIX METER) DEVI Check blood sugar three times / day, before meals. 06/18/20   Charlott Rakes, MD  ferrous sulfate 325 (65 FE) MG tablet Take 1 tablet (325 mg total) by mouth 2 (two) times daily with a meal. Patient not taking: No sig reported 04/03/16   Dhungel, Nishant, MD  glucose blood (ACCU-CHEK GUIDE) test strip Use as instructed to check blood sugar TID. E11.65 06/23/20   Charlott Rakes, MD  glucose blood (ACCU-CHEK GUIDE) test strip Use as instructed up to 4 times daily 07/02/21   Mariel Aloe, MD  insulin aspart (NOVOLOG) 100 UNIT/ML injection INJECT 0-15 UNITS INTO THE SKIN 3 (THREE) TIMES DAILY WITH MEALS. SLIDING SCALE CBG 70 - 120: 0 UNITS: CBG 121 - 140: 2 UNITS; CBG 140 - 200:8 UNITS Patient not taking: No sig reported 04/28/21   Charlott Rakes, MD  insulin glargine (LANTUS) 100 UNIT/ML Solostar Pen INJECT 60 UNITS INTO THE SKIN DAILY. Patient not taking: No sig reported 01/13/21 01/13/22  Charlott Rakes, MD  Insulin Pen Needle (BD PEN NEEDLE NANO U/F) 32G X 4 MM MISC USE TO INJECT LANTUS DAILY. MUST USE NEW PEN NEEDLE WITH EACH INJECTION. 07/27/21   Charlott Rakes, MD  ondansetron (ZOFRAN) 4 MG tablet Take 1 tablet (4 mg total) by mouth every 6 (six) hours as needed for nausea or vomiting. Patient not taking: No sig reported 35/32/99   Delora Fuel, MD  potassium chloride (KLOR-CON) 10 MEQ tablet TAKE 1 TABLET (10 MEQ TOTAL) BY MOUTH DAILY. Patient not taking: No sig reported 01/13/21 01/13/22  Charlott Rakes, MD  sacubitril-valsartan (ENTRESTO) 97-103 MG TAKE 1 TABLET BY MOUTH 2 (TWO) TIMES DAILY. Patient not taking: No sig reported 03/11/21 03/11/22  Shirley Friar, PA-C  spironolactone (ALDACTONE) 25 MG tablet TAKE 1 TABLET (25 MG TOTAL) BY MOUTH DAILY. Patient not  taking: No sig reported 01/13/21 01/13/22  Charlott Rakes, MD    Physical Exam: Constitutional: Moderately built and nourished. Vitals:   08/06/21 1800 08/06/21 1841 08/06/21 1933 08/06/21 1934  BP: (!) 174/112 (!) 151/98 (!) 149/89   Pulse: 77 72 74   Resp: (!) _0 Temp:   97.9 F (36.6 C)   TempSrc:   Oral   SpO2: 96% 97% 98%  Weight:    98 kg  Height:    _0  (1.702 m)   Eyes: Anicteric no pallor. ENMT: No discharge from the ears eyes nose and mouth. Neck: No mass felt.  No neck rigidity. Respiratory: No rhonchi or crepitations. Cardiovascular: S1-S2 heard. Abdomen: Soft nontender bowel sounds present. Musculoskeletal: Bilateral lower extremity edema present. Skin: No rash. Neurologic: Alert awake oriented to his name and place.  Moving all extremities. Psychiatric: Appears normal.  Normal affect.   Labs on Admission: I have personally reviewed following labs and imaging studies  CBC: Recent Labs  Lab 08/03/21 1338 08/06/21 1737 08/06/21 1751  WBC 5.8 5.9  --   NEUTROABS  --  4.2  --   HGB 12.0* 10.9* 11.9*  HCT 40.3 36.3* 35.0*  MCV 81.9 82.7  --   PLT 288 276  --    Basic Metabolic Panel: Recent Labs  Lab 08/03/21 1338 08/06/21 1737 08/06/21 1751  NA 137 142 146*  K 3.6 2.5* 2.5*  CL 106 110 108  CO2 24 24  --   GLUCOSE 161* 71 70  BUN _1 CREATININE 1.76* 2.06* 2.00*  CALCIUM 8.9 8.4*  --   MG  --  1.4*  --    GFR: Estimated Creatinine Clearance: 46 mL/min (A) (by C-G formula based on SCr of 2 mg/dL (H)). Liver Function Tests: Recent Labs  Lab 08/06/21 1737  AST 25  ALT 24  ALKPHOS 59  BILITOT 0.5  PROT 6.3*  ALBUMIN 2.8*   No results for input(s): LIPASE, AMYLASE in the last 168 hours. No results for input(s): AMMONIA in the last 168 hours. Coagulation Profile: No results for input(s): INR, PROTIME in the last 168 hours. Cardiac Enzymes: No results for input(s): CKTOTAL, CKMB, CKMBINDEX, TROPONINI in the last 168  hours. BNP (last 3 results) Recent Labs    01/27/21 1227 03/30/21 1130  PROBNP 185 290*   HbA1C: No results for input(s): HGBA1C in the last 72 hours. CBG: Recent Labs  Lab 08/03/21 1436 08/03/21 1521 08/06/21 1745 08/06/21 1840 08/06/21 1946  GLUCAP 76 130* 70 84 73   Lipid Profile: No results for input(s): CHOL, HDL, LDLCALC, TRIG, CHOLHDL, LDLDIRECT in the last 72 hours. Thyroid Function Tests: No results for input(s): TSH, T4TOTAL, FREET4, T3FREE, THYROIDAB in the last 72 hours. Anemia Panel: No results for input(s): VITAMINB12, FOLATE, FERRITIN, TIBC, IRON, RETICCTPCT in the last 72 hours. Urine analysis:    Component Value Date/Time   COLORURINE STRAW (A) 06/30/2021 1735   APPEARANCEUR CLEAR 06/30/2021 1735   LABSPEC 1.021 06/30/2021 1735   PHURINE 6.0 06/30/2021 1735   GLUCOSEU >=500 (A) 06/30/2021 1735   HGBUR MODERATE (A) 06/30/2021 1735   BILIRUBINUR NEGATIVE 06/30/2021 1735   BILIRUBINUR neg 03/30/2016 1116   KETONESUR NEGATIVE 06/30/2021 1735   PROTEINUR 100 (A) 06/30/2021 1735   UROBILINOGEN 0.2 03/30/2016 1116   UROBILINOGEN 1.0 03/10/2014 2230   NITRITE NEGATIVE 06/30/2021 1735   LEUKOCYTESUR NEGATIVE 06/30/2021 1735   Sepsis Labs: _2 (procalcitonin:4,lacticidven:4) )No results found for this or any previous visit (from the past 240 hour(s)).   Radiological Exams on Admission: No results found.   Assessment/Plan Principal Problem:   Hypoglycemia Active Problems:   Essential hypertension   Uncontrolled type 2 diabetes mellitus with hyperglycemia, with long-term current use of insulin (HCC)   Anemia, iron deficiency   Chronic combined systolic (congestive) and diastolic (congestive) heart failure (HCC)   Coronary artery disease involving native coronary artery of native  heart without angina pectoris   S/P ICD (internal cardiac defibrillator) procedure   Nephrotic syndrome   Acute on chronic renal failure (HCC)   Hypokalemia     Recurrent episodes of hypoglycemia in the setting of worsening renal function -patient last hemoglobin A1c last month was 14.  Probably patient may have to switch from Lantus to NPH.  For now we will hold off any insulin and check CBGs q. hourly until patient's blood sugar is more stable. Hypokalemia and hypomagnesemia likely from diuretic use.  Holding diuretics replacing potassium and magnesium recheck magnesium and potassium after replacement. Acute on chronic kidney disease stage III likely from diuretic.  Probably new baseline.  Restart diuretics after electrolytes are corrected. Chronic combined systolic and diastolic EF status post ICD placement presently holding diuretics due to electrolyte imbalance.  Restart once electrolytes are corrected. Anemia appears to be chronic. Hypertension on Imdur hydralazine and Coreg.   DVT prophylaxis: Heparin. Code Status: Full code. Family Communication: [name] wife. Disposition Plan: Home. Consults called: None. Admission status: Observation.   Rise Patience MD [facility] Pager [phone].  If 7PM-7AM, please contact night-coverage [email or web address] Password [email or web address]  08/06/2021, 8:02 PM

## 2021-08-07 DIAGNOSIS — E162 Hypoglycemia, unspecified: Secondary | ICD-10-CM | POA: Diagnosis not present

## 2021-08-07 LAB — BASIC METABOLIC PANEL
Anion gap: 6 (ref 5–15)
BUN: 19 mg/dL (ref 6–20)
CO2: 23 mmol/L (ref 22–32)
Calcium: 8.1 mg/dL — ABNORMAL LOW (ref 8.9–10.3)
Chloride: 108 mmol/L (ref 98–111)
Creatinine, Ser: 1.97 mg/dL — ABNORMAL HIGH (ref 0.61–1.24)
GFR, Estimated: 39 mL/min — ABNORMAL LOW (ref >60)
Glucose, Bld: 171 mg/dL — ABNORMAL HIGH (ref 70–99)
Potassium: 3.1 mmol/L — ABNORMAL LOW (ref 3.5–5.1)
Sodium: 137 mmol/L (ref 135–145)

## 2021-08-07 LAB — GLUCOSE, CAPILLARY
Glucose-Capillary: 139 mg/dL — ABNORMAL HIGH (ref 70–99)
Glucose-Capillary: 151 mg/dL — ABNORMAL HIGH (ref 70–99)
Glucose-Capillary: 152 mg/dL — ABNORMAL HIGH (ref 70–99)
Glucose-Capillary: 152 mg/dL — ABNORMAL HIGH (ref 70–99)
Glucose-Capillary: 154 mg/dL — ABNORMAL HIGH (ref 70–99)
Glucose-Capillary: 155 mg/dL — ABNORMAL HIGH (ref 70–99)
Glucose-Capillary: 165 mg/dL — ABNORMAL HIGH (ref 70–99)
Glucose-Capillary: 166 mg/dL — ABNORMAL HIGH (ref 70–99)
Glucose-Capillary: 173 mg/dL — ABNORMAL HIGH (ref 70–99)
Glucose-Capillary: 193 mg/dL — ABNORMAL HIGH (ref 70–99)
Glucose-Capillary: 194 mg/dL — ABNORMAL HIGH (ref 70–99)
Glucose-Capillary: 198 mg/dL — ABNORMAL HIGH (ref 70–99)

## 2021-08-07 LAB — CBC
HCT: 33.5 % — ABNORMAL LOW (ref 39.0–52.0)
Hemoglobin: 10.3 g/dL — ABNORMAL LOW (ref 13.0–17.0)
MCH: 24.9 pg — ABNORMAL LOW (ref 26.0–34.0)
MCHC: 30.7 g/dL (ref 30.0–36.0)
MCV: 81.1 fL (ref 80.0–100.0)
Platelets: 273 10*3/uL (ref 150–400)
RBC: 4.13 MIL/uL — ABNORMAL LOW (ref 4.22–5.81)
RDW: 15.4 % (ref 11.5–15.5)
WBC: 5.4 10*3/uL (ref 4.0–10.5)
nRBC: 0 % (ref 0.0–0.2)

## 2021-08-07 MED ORDER — POTASSIUM CHLORIDE CRYS ER 20 MEQ PO TBCR
40.0000 meq | EXTENDED_RELEASE_TABLET | ORAL | Status: AC
  Administered 2021-08-07 (×2): 40 meq via ORAL
  Filled 2021-08-07 (×2): qty 2

## 2021-08-07 MED ORDER — INSULIN ASPART 100 UNIT/ML IJ SOLN: 0.0000 [IU] | Freq: Three times a day (TID) | INTRAMUSCULAR | Status: DC

## 2021-08-07 MED ORDER — NOVOLOG FLEXPEN 100 UNIT/ML ~~LOC~~ SOPN
0.0000 [IU] | PEN_INJECTOR | Freq: Three times a day (TID) | SUBCUTANEOUS | 1 refills | Status: DC
  Filled 2021-08-09: qty 9, 33d supply, fill #0
  Filled 2021-09-20: qty 9, 33d supply, fill #1

## 2021-08-07 MED ORDER — POTASSIUM CHLORIDE CRYS ER 20 MEQ PO TBCR
40.0000 meq | EXTENDED_RELEASE_TABLET | Freq: Every day | ORAL | 0 refills | Status: DC
  Filled 2021-08-09: qty 60, 30d supply, fill #0

## 2021-08-07 MED ORDER — MAGNESIUM OXIDE 400 MG PO TABS
400.0000 mg | ORAL_TABLET | Freq: Every day | ORAL | 0 refills | Status: DC
  Filled 2021-08-09: qty 15, 15d supply, fill #0

## 2021-08-07 MED ORDER — LANTUS SOLOSTAR 100 UNIT/ML ~~LOC~~ SOPN: 35.0000 [IU] | PEN_INJECTOR | Freq: Every day | SUBCUTANEOUS | 2 refills | Status: DC

## 2021-08-07 NOTE — Progress Notes (Signed)
Inpatient Diabetes Program Recommendations  AACE/ADA: New Consensus Statement on Inpatient Glycemic Control (2015)  Target Ranges:  Prepandial:   less than 140 mg/dL      Peak postprandial:   less than 180 mg/dL (1-2 hours)      Critically ill patients:  140 - 180 mg/dL   Lab Results  Component Value Date   GLUCAP 166 (H) 08/07/2021   HGBA1C 14.2 (H) 06/30/2021    Review of Glycemic Control  Diabetes history: DM 2 Outpatient Diabetes medications: Lantus 40 units daily, Novolog SSI Current orders for Inpatient glycemic control:  None  Inpatient Diabetes Program Recommendations:    Spoke with pt at bedside regarding hypoglycemia and A1c of 14.2 on 7/20. Pt reports not checking glucose all the time and guessing, taking too much of Novolog with the meals causing hypoglycemia. Pt reports it is the evening when his hypoglycemia occurs and is after an instance of when he does not check the glucose in order to give his prescribed sliding scale. Pt also reported taking 45 of lantus instead of the prescribed 40 from last admission.  Pt feels the Lantus dose prescribed last admission is appropriate and thinks he just needs to do what is prescribed. He has a friend, Primus Mcnamara, who will be assisting him from now on making sure his doses are right.  D/c: Lantus 35 units Daily starting tomorrow Novolog 0-9 units tid sliding scale  Thanks,  Tama Headings RN, MSN, BC-ADM Inpatient Diabetes Coordinator Team Pager [phone] (8a-5p)

## 2021-08-07 NOTE — Discharge Summary (Addendum)
Physician Discharge Summary  Jeffrey [name]:[id number] DOB: Apr 29, 1966 DOA: 08/06/2021  PCP: Charlott Rakes, MD  Admit date: 08/06/2021 Discharge date: 08/07/2021  Admitted From: Home Disposition:  Home  Discharge Condition:Stable CODE STATUS:FULL Diet recommendation:  Carb Modified  Brief/Interim Summary: Jeffrey [name] is a 55 y.o. male with history of nonischemic cardiomyopathy status post AICD placement, chronic kidney disease stage III, diabetes mellitus, anemia, hypertension was admitted last month for hyperosmolar status when patient also was found to have seizure-like activity is being brought to the ER for the second time in the last 3 days for hypoglycemic episode.  Patient was originally scheduled to have an eye surgery 3 days ago but was found to be hypoglycemic and the surgery was canceled.  Patient was brought to the ER and was optimized and discharged home.  On evening  of 08/06/21,patient was found to be unresponsive by patient's family and EMS was called and patient blood sugar was found to be in the 20s.  Patient was given D50 and few minutes later patient became more alert but still confused.  On presentation blood sugar was in the range of 60s.  Patient was admitted for observation. This morning, his blood sugars have been stable.  Upon discussion with the patient it was found that he was taking more insulin than needed.  Diabetic coordinator was consulted.  Recommending to decrease the Lantus to 35 units and sliding scale to 0.9 3 times daily patient has been advised to monitor his sugars closely at home.  Patient was also found to be severely hypokalemic and hypomagnesemic, supplemented here and we recommend to continue taking potassium and magnesium as an outpatient.  He needs to follow-up with the cardiologist as possible for the management of his chronic nonischemic cardiomyopathy.  Following problems were addressed during his hospitalization:  Recurrent episodes of  hypoglycemia in the setting of worsening renal function -patient last hemoglobin A1c last month was 14. Management as above  Hypokalemia and hypomagnesemia likely from diuretic use. Continue supplmentation.  Acute on chronic kidney disease stage III likely from diuretic.  Probably new baseline.  Restart diuretics after electrolytes are corrected.  Chronic combined systolic and diastolic : status post ICD placement.  Lasix at home.  He needs to follow-up with cardiology as an outpatient.  He was previously taking spironolactone and Entresto.  On last note, cardiology recommended to continue holding low-dose until follow-up as an outpatient.  Continue Lasix  Anemia appears to be chronic.  Hypertension: on Imdur hydralazine and Coreg  Discharge Diagnoses:  Principal Problem:   Hypoglycemia Active Problems:   Essential hypertension   Uncontrolled type 2 diabetes mellitus with hyperglycemia, with long-term current use of insulin (HCC)   Anemia, iron deficiency   Chronic combined systolic (congestive) and diastolic (congestive) heart failure (HCC)   Coronary artery disease involving native coronary artery of native heart without angina pectoris   S/P ICD (internal cardiac defibrillator) procedure   Nephrotic syndrome   Acute on chronic renal failure (HCC)   Hypokalemia    Discharge Instructions  Discharge Instructions     Diet Carb Modified   Complete by: As directed    Discharge instructions   Complete by: As directed    1)Please monitor blood sugars at home.Take insulin as instructed 2) follow-up with your PCP in a week.  Do a BMP, magnesium test during the follow-up. 3)Please follow-up with cardiology as an outpatient as soon as possible   Increase activity slowly   Complete by: As  directed       Allergies as of 08/07/2021   No Known Allergies      Medication List     STOP taking these medications    benzonatate 100 MG capsule Commonly known as: TESSALON    Entresto 97-103 MG Generic drug: sacubitril-valsartan   NovoLOG 100 UNIT/ML injection Generic drug: insulin aspart Replaced by: NovoLOG FlexPen 100 UNIT/ML FlexPen   potassium chloride 10 MEQ tablet Commonly known as: KLOR-CON   spironolactone 25 MG tablet Commonly known as: ALDACTONE       TAKE these medications    Accu-Chek Guide test strip Generic drug: glucose blood Use as instructed to check blood sugar TID. E11.65   Accu-Chek Guide test strip Generic drug: glucose blood Use as instructed up to 4 times daily   Accu-Chek Softclix Lancets lancets Use as instructed to check blood sugar TID. E11.65   Accu-Chek Softclix Lancets lancets Use as directed up to 4 times daily   albuterol 108 (90 Base) MCG/ACT inhaler Commonly known as: VENTOLIN HFA Inhale 1-2 puffs into the lungs every 6 (six) hours as needed for wheezing or shortness of breath.   aspirin 81 MG EC tablet Take 1 tablet (81 mg total) by mouth daily. Swallow whole.   BD Pen Needle Nano U/F 32G X 4 MM Misc Generic drug: Insulin Pen Needle USE TO INJECT LANTUS DAILY. MUST USE NEW PEN NEEDLE WITH EACH INJECTION.   carvedilol 12.5 MG tablet Commonly known as: COREG TAKE 1 TABLET (12.5 MG TOTAL) BY MOUTH 2 (TWO) TIMES DAILY WITH A MEAL.   cetirizine 10 MG tablet Commonly known as: ZYRTEC TAKE 1 TABLET (10 MG TOTAL) BY MOUTH DAILY. What changed:  when to take this reasons to take this   ferrous sulfate 325 (65 FE) MG tablet Take 1 tablet (325 mg total) by mouth 2 (two) times daily with a meal.   furosemide 40 MG tablet Commonly known as: LASIX TAKE 1.5 TABLETS (60 MG TOTAL) BY MOUTH DAILY. What changed:  how much to take how to take this when to take this   gabapentin 400 MG capsule Commonly known as: NEURONTIN TAKE 1 CAPSULE BY MOUTH 3 TAKE TIMES DAILY.   hydrALAZINE 25 MG tablet Commonly known as: APRESOLINE TAKE 1 TABLET (25 MG TOTAL) BY MOUTH 3 (THREE) TIMES DAILY.   isosorbide  mononitrate 30 MG 24 hr tablet Commonly known as: IMDUR Take 1 tablet (30 mg total) by mouth daily.   Lantus SoloStar 100 UNIT/ML Solostar Pen Generic drug: insulin glargine Inject 35 Units into the skin daily. Start taking on: August 08, 2021 What changed:  how much to take when to take this Another medication with the same name was removed. Continue taking this medication, and follow the directions you see here.   Magnesium Oxide 400 MG Caps Take 1 capsule (400 mg total) by mouth daily.   NovoLOG FlexPen 100 UNIT/ML FlexPen Generic drug: insulin aspart Inject 0-9 Units into the skin 3 (three) times daily with meals. CBG 70 - 120: 0 units  CBG 121 - 150: 1 unit  CBG 151 - 200: 2 units  CBG 201 - 250: 3 units  CBG 251 - 300: 5 units  CBG 301 - 350: 7 units  CBG 351 - 400 9 units Replaces: NovoLOG 100 UNIT/ML injection   ondansetron 4 MG tablet Commonly known as: ZOFRAN Take 1 tablet (4 mg total) by mouth every 6 (six) hours as needed for nausea or vomiting.   potassium chloride  SA 20 MEQ tablet Commonly known as: KLOR-CON Take 2 tablets (40 mEq total) by mouth daily.   rosuvastatin 40 MG tablet Commonly known as: CRESTOR TAKE 1 TABLET (40 MG TOTAL) BY MOUTH DAILY.   True Metrix Meter Devi Check blood sugar three times / day, before meals.   Accu-Chek Guide w/Device Kit Use as directed        Follow-up Information     Charlott Rakes, MD. Schedule an appointment as soon as possible for a visit in 1 week(s).   Specialty: Family Medicine Contact information: [place] [address] [phone]                No Known Allergies  Consultations: Diabetic coordinator  Procedures/Studies: No results found.    Subjective: Patient seen and examined the bedside this morning.  Hemodynamically stable for discharge today.  Discharge Exam: Vitals:   08/07/21 0737 08/07/21 0911  BP: (!) 147/82 (!) 158/95  Pulse: 85 86  Resp: 18    Temp: 98.9 F (37.2 C)   SpO2: 100%    Vitals:   08/07/21 0400 08/07/21 0406 08/07/21 0737 08/07/21 0911  BP: (!) 166/99 (!) 166/99 (!) 147/82 (!) 158/95  Pulse:  82 85 86  Resp:  14 18   Temp:  97.8 F (36.6 C) 98.9 F (37.2 C)   TempSrc:  Oral Oral   SpO2: 97% 98% 100%   Weight:  99.7 kg    Height:        General: Pt is alert, awake, not in acute distress, obese Cardiovascular: RRR, S1/S2 +, no rubs, no gallops Respiratory: CTA bilaterally, no wheezing, no rhonchi Abdominal: Soft, NT, ND, bowel sounds + Extremities: Trace bilateral lower extremity edema edema, no cyanosis    The results of significant diagnostics from this hospitalization (including imaging, microbiology, ancillary and laboratory) are listed below for reference.     Microbiology: Recent Results (from the past 240 hour(s))  Resp Panel by RT-PCR (Flu A&B, Covid) Nasopharyngeal Swab     Status: None   Collection Time: 08/06/21  7:10 PM   Specimen: Nasopharyngeal Swab; Nasopharyngeal(NP) swabs in vial transport medium  Result Value Ref Range Status   SARS Coronavirus 2 by RT PCR NEGATIVE NEGATIVE Final    Comment: (NOTE) SARS-CoV-2 target nucleic acids are NOT DETECTED.  The SARS-CoV-2 RNA is generally detectable in upper respiratory specimens during the acute phase of infection. The lowest concentration of SARS-CoV-2 viral copies this assay can detect is 138 copies/mL. A negative result does not preclude SARS-Cov-2 infection and should not be used as the sole basis for treatment or other patient management decisions. A negative result may occur with  improper specimen collection/handling, submission of specimen other than nasopharyngeal swab, presence of viral mutation(s) within the areas targeted by this assay, and inadequate number of viral copies(<138 copies/mL). A negative result must be combined with clinical observations, patient history, and epidemiological information. The expected result is  Negative.  Fact Sheet for Patients:  EntrepreneurPulse.com.au  Fact Sheet for Healthcare Providers:  IncredibleEmployment.be  This test is no t yet approved or cleared by the [place] FDA and  has been authorized for detection and/or diagnosis of SARS-CoV-2 by FDA under an Emergency Use Authorization (EUA). This EUA will remain  in effect (meaning this test can be used) for the duration of the COVID-19 declaration under Section 564(b)(1) of the Act, 21 U.S.C.section 360bbb-3(b)(1), unless the authorization is terminated  or revoked sooner.  Influenza A by PCR NEGATIVE NEGATIVE Final   Influenza B by PCR NEGATIVE NEGATIVE Final    Comment: (NOTE) The Xpert Xpress SARS-CoV-2/FLU/RSV plus assay is intended as an aid in the diagnosis of influenza from Nasopharyngeal swab specimens and should not be used as a sole basis for treatment. Nasal washings and aspirates are unacceptable for Xpert Xpress SARS-CoV-2/FLU/RSV testing.  Fact Sheet for Patients: EntrepreneurPulse.com.au  Fact Sheet for Healthcare Providers: IncredibleEmployment.be  This test is not yet approved or cleared by the [place] FDA and has been authorized for detection and/or diagnosis of SARS-CoV-2 by FDA under an Emergency Use Authorization (EUA). This EUA will remain in effect (meaning this test can be used) for the duration of the COVID-19 declaration under Section 564(b)(1) of the Act, 21 U.S.C. section 360bbb-3(b)(1), unless the authorization is terminated or revoked.  Performed at [facility], [place] [address], [place], [place] [address]      Labs: BNP (last 3 results) Recent Labs    06/30/21 1935  BNP 09.9   Basic Metabolic Panel: Recent Labs  Lab 08/03/21 1338 08/06/21 1737 08/06/21 1751 08/06/21 2001 08/07/21 0408  NA 137 142 146*  --  137  K 3.6 2.5* 2.5*  --  3.1*  CL 106 110 108  --  108  CO2  24 24  --   --  23  GLUCOSE 161* 71 70  --  171*  BUN '16 18 18  ' --  19  CREATININE 1.76* 2.06* 2.00* 1.98* 1.97*  CALCIUM 8.9 8.4*  --   --  8.1*  MG  --  1.4*  --   --   --    Liver Function Tests: Recent Labs  Lab 08/06/21 1737  AST 25  ALT 24  ALKPHOS 59  BILITOT 0.5  PROT 6.3*  ALBUMIN 2.8*   No results for input(s): LIPASE, AMYLASE in the last 168 hours. No results for input(s): AMMONIA in the last 168 hours. CBC: Recent Labs  Lab 08/03/21 1338 08/06/21 1737 08/06/21 1751 08/06/21 2001 08/07/21 0408  WBC 5.8 5.9  --  6.2 5.4  NEUTROABS  --  4.2  --   --   --   HGB 12.0* 10.9* 11.9* 11.0* 10.3*  HCT 40.3 36.3* 35.0* 36.4* 33.5*  MCV 81.9 82.7  --  82.0 81.1  PLT 288 276  --  270 273   Cardiac Enzymes: No results for input(s): CKTOTAL, CKMB, CKMBINDEX, TROPONINI in the last 168 hours. BNP: Invalid input(s): POCBNP CBG: Recent Labs  Lab 08/07/21 0602 08/07/21 0700 08/07/21 0807 08/07/21 0856 08/07/21 1004  GLUCAP 152* 151* 139* 166* 154*   D-Dimer No results for input(s): DDIMER in the last 72 hours. Hgb A1c No results for input(s): HGBA1C in the last 72 hours. Lipid Profile No results for input(s): CHOL, HDL, LDLCALC, TRIG, CHOLHDL, LDLDIRECT in the last 72 hours. Thyroid function studies No results for input(s): TSH, T4TOTAL, T3FREE, THYROIDAB in the last 72 hours.  Invalid input(s): FREET3 Anemia work up No results for input(s): VITAMINB12, FOLATE, FERRITIN, TIBC, IRON, RETICCTPCT in the last 72 hours. Urinalysis    Component Value Date/Time   COLORURINE YELLOW 08/06/2021 2119   APPEARANCEUR CLEAR 08/06/2021 2119   LABSPEC 1.010 08/06/2021 2119   PHURINE 5.0 08/06/2021 2119   GLUCOSEU NEGATIVE 08/06/2021 2119   HGBUR SMALL (A) 08/06/2021 2119   BILIRUBINUR NEGATIVE 08/06/2021 2119   BILIRUBINUR neg 03/30/2016 1116   KETONESUR NEGATIVE 08/06/2021 2119   PROTEINUR 100 (A)  08/06/2021 2119   UROBILINOGEN 0.2 03/30/2016 1116   UROBILINOGEN  1.0 03/10/2014 2230   NITRITE NEGATIVE 08/06/2021 2119   LEUKOCYTESUR NEGATIVE 08/06/2021 2119   Sepsis Labs Invalid input(s): PROCALCITONIN,  WBC,  LACTICIDVEN Microbiology Recent Results (from the past 240 hour(s))  Resp Panel by RT-PCR (Flu A&B, Covid) Nasopharyngeal Swab     Status: None   Collection Time: 08/06/21  7:10 PM   Specimen: Nasopharyngeal Swab; Nasopharyngeal(NP) swabs in vial transport medium  Result Value Ref Range Status   SARS Coronavirus 2 by RT PCR NEGATIVE NEGATIVE Final    Comment: (NOTE) SARS-CoV-2 target nucleic acids are NOT DETECTED.  The SARS-CoV-2 RNA is generally detectable in upper respiratory specimens during the acute phase of infection. The lowest concentration of SARS-CoV-2 viral copies this assay can detect is 138 copies/mL. A negative result does not preclude SARS-Cov-2 infection and should not be used as the sole basis for treatment or other patient management decisions. A negative result may occur with  improper specimen collection/handling, submission of specimen other than nasopharyngeal swab, presence of viral mutation(s) within the areas targeted by this assay, and inadequate number of viral copies(<138 copies/mL). A negative result must be combined with clinical observations, patient history, and epidemiological information. The expected result is Negative.  Fact Sheet for Patients:  EntrepreneurPulse.com.au  Fact Sheet for Healthcare Providers:  IncredibleEmployment.be  This test is no t yet approved or cleared by the [place] FDA and  has been authorized for detection and/or diagnosis of SARS-CoV-2 by FDA under an Emergency Use Authorization (EUA). This EUA will remain  in effect (meaning this test can be used) for the duration of the COVID-19 declaration under Section 564(b)(1) of the Act, 21 U.S.C.section 360bbb-3(b)(1), unless the authorization is terminated  or revoked sooner.        Influenza A by PCR NEGATIVE NEGATIVE Final   Influenza B by PCR NEGATIVE NEGATIVE Final    Comment: (NOTE) The Xpert Xpress SARS-CoV-2/FLU/RSV plus assay is intended as an aid in the diagnosis of influenza from Nasopharyngeal swab specimens and should not be used as a sole basis for treatment. Nasal washings and aspirates are unacceptable for Xpert Xpress SARS-CoV-2/FLU/RSV testing.  Fact Sheet for Patients: EntrepreneurPulse.com.au  Fact Sheet for Healthcare Providers: IncredibleEmployment.be  This test is not yet approved or cleared by the [place] FDA and has been authorized for detection and/or diagnosis of SARS-CoV-2 by FDA under an Emergency Use Authorization (EUA). This EUA will remain in effect (meaning this test can be used) for the duration of the COVID-19 declaration under Section 564(b)(1) of the Act, 21 U.S.C. section 360bbb-3(b)(1), unless the authorization is terminated or revoked.  Performed at [facility], [place] [address], [place],  [address]     Please note: You were cared for by a hospitalist during your hospital stay. Once you are discharged, your primary care physician will handle any further medical issues. Please note that NO REFILLS for any discharge medications will be authorized once you are discharged, as it is imperative that you return to your primary care physician (or establish a relationship with a primary care physician if you do not have one) for your post hospital discharge needs so that they can reassess your need for medications and monitor your lab values.    Time coordinating discharge: 40 minutes  SIGNED:   Shelly Coss, MD  [facility] 08/07/2021, 11:09 AM Pager [phone]  If 7PM-7AM, please contact night-coverage [email or web address] Password [email or web address]

## 2021-08-09 ENCOUNTER — Other Ambulatory Visit: Payer: Self-pay

## 2021-08-09 ENCOUNTER — Telehealth: Payer: Self-pay

## 2021-08-09 NOTE — Telephone Encounter (Signed)
Transition Care Management Unsuccessful Follow-up Telephone Call  Date of discharge and from where:  08/07/2021, [facility]   Attempts:  1st Attempt  Reason for unsuccessful TCM follow-up call:  Left voice messages on # [phone] and [phone]. Call back requested to this CM.  Patient has hospital follow up appointments scheduled for 08/10/2021 and 08/12/2021.  Need to cancel one of the appointments.

## 2021-08-10 ENCOUNTER — Other Ambulatory Visit: Payer: Self-pay

## 2021-08-10 ENCOUNTER — Telehealth: Payer: Medicare Other | Admitting: Nurse Practitioner

## 2021-08-10 ENCOUNTER — Telehealth: Payer: Self-pay

## 2021-08-10 DIAGNOSIS — E1165 Type 2 diabetes mellitus with hyperglycemia: Secondary | ICD-10-CM

## 2021-08-10 NOTE — Telephone Encounter (Signed)
Pt returning call to [facility]. Attempted x3  to reach office with no response. Please advise.

## 2021-08-10 NOTE — Telephone Encounter (Signed)
From the discharge call:  He said he is feeling okay now. He thinks he took too much insulin and that is how he ended up in the hospital   he needs refill of glucometer test strips. [facility] pharmacy unable to fill due to Medicare Part B.  Rx needs to be sent to [place]   he said he has all medications including the new ones and has been taking them as ordered. He did not have any questions about his med regime and stated that his girlfriend, [name], gives him his insulin and manages his medications.   He is requesting a walker   Scheduled to see Freeman Caldron, PA on 08/12/2021 - virtual visit.  He said that he forgot about the appointment today

## 2021-08-10 NOTE — Telephone Encounter (Addendum)
Transition Care Management Unsuccessful Follow-up Telephone Call  Date of discharge and from where:  08/07/2021, [facility]   Attempts:  2nd Attempt  Reason for unsuccessful TCM follow-up call:  Left voice message on # [phone].  Call back requested to this CM. Call placed to # [phone], the person who answered said he was in the bathroom and would call this CM back.

## 2021-08-10 NOTE — Telephone Encounter (Signed)
Transition Care Management Follow-up Telephone Call  Call returned to patient Date of discharge and from where: 08/07/2021, [facility]  How have you been since you were released from the hospital? He said he is feeling okay now. He thinks he took too much insulin and that is how he ended up in the hospital  Any questions or concerns? Yes - he needs refill of glucometer test strips. [facility] pharmacy unable to fill due to Medicare Part B.  Rx needs to be sent to [place]   Items Reviewed: Did the pt receive and understand the discharge instructions provided? Yes  Medications obtained and verified? Yes  - he said he has all medications including the new ones and has been taking them as ordered. He did not have any questions about his med regime and stated that his girlfriend, Jeffrey [name], gives him his insulin and manages his medications.  Other? No  Any new allergies since your discharge? No  Dietary orders reviewed? No Do you have support at home? Yes   Home Care and Equipment/Supplies: Were home health services ordered? no If so, what is the name of the agency? N/a  Has the agency set up a time to come to the patient's home? not applicable Were any new equipment or medical supplies ordered?  No What is the name of the medical supply agency? N/a Were you able to get the supplies/equipment? not applicable Do you have any questions related to the use of the equipment or supplies? No  Functional Questionnaire: (I = Independent and D = Dependent) ADLs: has been using a friend's walker as needed with ambulation.  He is requesting his own walker. independent with personal care but his girlfriend can assist as needed.   Follow up appointments reviewed:  PCP Hospital f/u appt confirmed? Yes  Scheduled to see Jeffrey [name], [place] on 08/12/2021 - virtual visit.  He said that he forgot about the appointment today with Jeffrey Arms, NP.   [facility] f/u appt confirmed? Yes   Scheduled to see neurology on 08/23/2021. Are transportation arrangements needed? No  If their condition worsens, is the pt aware to call PCP or go to the Emergency Dept.? Yes Was the patient provided with contact information for the PCP's office or ED? Yes Was to pt encouraged to call back with questions or concerns? Yes

## 2021-08-11 NOTE — Progress Notes (Deleted)
Patient ID: Jeffrey [name], male   DOB: 04-12-1966, 55 y.o.   MRN: [id number]  After hospitalization 8/26-8/27/2022  From discharge summary: Brief/Interim Summary: Jeffrey [name] is a 55 y.o. male with history of nonischemic cardiomyopathy status post AICD placement, chronic kidney disease stage III, diabetes mellitus, anemia, hypertension was admitted last month for hyperosmolar status when patient also was found to have seizure-like activity is being brought to the ER for the second time in the last 3 days for hypoglycemic episode.  Patient was originally scheduled to have an eye surgery 3 days ago but was found to be hypoglycemic and the surgery was canceled.  Patient was brought to the ER and was optimized and discharged home.  On evening  of 08/06/21,patient was found to be unresponsive by patient's family and EMS was called and patient blood sugar was found to be in the 20s.  Patient was given D50 and few minutes later patient became more alert but still confused.  On presentation blood sugar was in the range of 60s.  Patient was admitted for observation. This morning, his blood sugars have been stable.  Upon discussion with the patient it was found that he was taking more insulin than needed.  Diabetic coordinator was consulted.  Recommending to decrease the Lantus to 35 units and sliding scale to 0.9 3 times daily patient has been advised to monitor his sugars closely at home.  Patient was also found to be severely hypokalemic and hypomagnesemic, supplemented here and we recommend to continue taking potassium and magnesium as an outpatient.  He needs to follow-up with the cardiologist as possible for the management of his chronic nonischemic cardiomyopathy.   Following problems were addressed during his hospitalization:   Recurrent episodes of hypoglycemia in the setting of worsening renal function -patient last hemoglobin A1c last month was 14. Management as above   Hypokalemia and  hypomagnesemia likely from diuretic use. Continue supplmentation.   Acute on chronic kidney disease stage III likely from diuretic.  Probably new baseline.  Restart diuretics after electrolytes are corrected.   Chronic combined systolic and diastolic : status post ICD placement.  Lasix at home.  He needs to follow-up with cardiology as an outpatient.  He was previously taking spironolactone and Entresto.  On last note, cardiology recommended to continue holding low-dose until follow-up as an outpatient.  Continue Lasix   Anemia appears to be chronic.   Hypertension: on Imdur hydralazine and Coreg   Discharge Diagnoses:  Principal Problem:   Hypoglycemia Active Problems:   Essential hypertension   Uncontrolled type 2 diabetes mellitus with hyperglycemia, with long-term current use of insulin (HCC)   Anemia, iron deficiency   Chronic combined systolic (congestive) and diastolic (congestive) heart failure (HCC)   Coronary artery disease involving native coronary artery of native heart without angina pectoris   S/P ICD (internal cardiac defibrillator) procedure   Nephrotic syndrome   Acute on chronic renal failure (HCC)   Hypokalemia

## 2021-08-11 NOTE — Telephone Encounter (Signed)
Refills sent last month to our pharmacy. He can pick these up from our pharmacy.

## 2021-08-12 ENCOUNTER — Other Ambulatory Visit: Payer: Self-pay

## 2021-08-12 ENCOUNTER — Ambulatory Visit: Payer: Medicare Other | Attending: Physician Assistant | Admitting: Physician Assistant

## 2021-08-12 MED ORDER — ACCU-CHEK SOFTCLIX LANCETS MISC: 5 refills | Status: AC

## 2021-08-12 MED ORDER — ACCU-CHEK GUIDE W/DEVICE KIT: PACK | 0 refills | Status: AC

## 2021-08-12 MED ORDER — ACCU-CHEK GUIDE VI STRP: ORAL_STRIP | 12 refills | Status: AC

## 2021-08-12 NOTE — Telephone Encounter (Signed)
Call placed to patient and informed him that the rx for test strips was set to [facility]

## 2021-08-12 NOTE — Addendum Note (Signed)
Addended by: Daisy Blossom, Annie Main L on: 08/12/2021 12:33 PM   Modules accepted: Orders

## 2021-08-12 NOTE — Telephone Encounter (Signed)
I see, sorry about that. Rx sent to [facility] on [address]

## 2021-08-13 ENCOUNTER — Encounter (HOSPITAL_COMMUNITY): Payer: Self-pay

## 2021-08-13 ENCOUNTER — Other Ambulatory Visit: Payer: Self-pay

## 2021-08-13 ENCOUNTER — Emergency Department (HOSPITAL_COMMUNITY)
Admission: EM | Admit: 2021-08-13 | Discharge: 2021-08-13 | Disposition: A | Discharge status: Home / Self Care | Payer: Medicare Other | Attending: Emergency Medicine | Admitting: Emergency Medicine

## 2021-08-13 DIAGNOSIS — I5042 Chronic combined systolic (congestive) and diastolic (congestive) heart failure: Secondary | ICD-10-CM | POA: Insufficient documentation

## 2021-08-13 DIAGNOSIS — I130 Hypertensive heart and chronic kidney disease with heart failure and stage 1 through stage 4 chronic kidney disease, or unspecified chronic kidney disease: Secondary | ICD-10-CM | POA: Insufficient documentation

## 2021-08-13 DIAGNOSIS — E162 Hypoglycemia, unspecified: Secondary | ICD-10-CM

## 2021-08-13 DIAGNOSIS — N183 Chronic kidney disease, stage 3 unspecified: Secondary | ICD-10-CM | POA: Insufficient documentation

## 2021-08-13 DIAGNOSIS — E1140 Type 2 diabetes mellitus with diabetic neuropathy, unspecified: Secondary | ICD-10-CM | POA: Diagnosis not present

## 2021-08-13 DIAGNOSIS — Z7982 Long term (current) use of aspirin: Secondary | ICD-10-CM | POA: Insufficient documentation

## 2021-08-13 DIAGNOSIS — Z79899 Other long term (current) drug therapy: Secondary | ICD-10-CM | POA: Diagnosis not present

## 2021-08-13 DIAGNOSIS — Z955 Presence of coronary angioplasty implant and graft: Secondary | ICD-10-CM | POA: Insufficient documentation

## 2021-08-13 DIAGNOSIS — Z95810 Presence of automatic (implantable) cardiac defibrillator: Secondary | ICD-10-CM | POA: Diagnosis not present

## 2021-08-13 DIAGNOSIS — Z794 Long term (current) use of insulin: Secondary | ICD-10-CM | POA: Diagnosis not present

## 2021-08-13 DIAGNOSIS — I10 Essential (primary) hypertension: Secondary | ICD-10-CM

## 2021-08-13 DIAGNOSIS — E11649 Type 2 diabetes mellitus with hypoglycemia without coma: Secondary | ICD-10-CM | POA: Diagnosis not present

## 2021-08-13 DIAGNOSIS — I2510 Atherosclerotic heart disease of native coronary artery without angina pectoris: Secondary | ICD-10-CM | POA: Insufficient documentation

## 2021-08-13 LAB — URINALYSIS, ROUTINE W REFLEX MICROSCOPIC
Bilirubin Urine: NEGATIVE
Glucose, UA: NEGATIVE mg/dL
Ketones, ur: NEGATIVE mg/dL
Leukocytes,Ua: NEGATIVE
Nitrite: NEGATIVE
Protein, ur: >=300 mg/dL — AB
Specific Gravity, Urine: 1.011 (ref 1.005–1.030)
pH: 7 (ref 5.0–8.0)

## 2021-08-13 LAB — CBG MONITORING, ED
Glucose-Capillary: 100 mg/dL — ABNORMAL HIGH (ref 70–99)
Glucose-Capillary: 131 mg/dL — ABNORMAL HIGH (ref 70–99)

## 2021-08-13 LAB — CBC
HCT: 38.6 % — ABNORMAL LOW (ref 39.0–52.0)
Hemoglobin: 11.8 g/dL — ABNORMAL LOW (ref 13.0–17.0)
MCH: 25.1 pg — ABNORMAL LOW (ref 26.0–34.0)
MCHC: 30.6 g/dL (ref 30.0–36.0)
MCV: 82 fL (ref 80.0–100.0)
Platelets: 291 10*3/uL (ref 150–400)
RBC: 4.71 MIL/uL (ref 4.22–5.81)
RDW: 15.3 % (ref 11.5–15.5)
WBC: 7.4 10*3/uL (ref 4.0–10.5)
nRBC: 0 % (ref 0.0–0.2)

## 2021-08-13 LAB — BASIC METABOLIC PANEL
Anion gap: 10 (ref 5–15)
BUN: 26 mg/dL — ABNORMAL HIGH (ref 6–20)
CO2: 24 mmol/L (ref 22–32)
Calcium: 8.8 mg/dL — ABNORMAL LOW (ref 8.9–10.3)
Chloride: 105 mmol/L (ref 98–111)
Creatinine, Ser: 1.71 mg/dL — ABNORMAL HIGH (ref 0.61–1.24)
GFR, Estimated: 46 mL/min — ABNORMAL LOW (ref >60)
Glucose, Bld: 105 mg/dL — ABNORMAL HIGH (ref 70–99)
Potassium: 3.1 mmol/L — ABNORMAL LOW (ref 3.5–5.1)
Sodium: 139 mmol/L (ref 135–145)

## 2021-08-13 MED ORDER — SODIUM CHLORIDE 0.9% FLUSH: 3.0000 mL | INTRAVENOUS | Status: DC | PRN

## 2021-08-13 MED ORDER — SODIUM CHLORIDE 0.9 % IV SOLN: 250.0000 mL | INTRAVENOUS | Status: DC | PRN

## 2021-08-13 MED ORDER — HYDRALAZINE HCL 25 MG PO TABS
50.0000 mg | ORAL_TABLET | Freq: Three times a day (TID) | ORAL | 3 refills | Status: DC
  Filled 2021-08-13: qty 540, 90d supply, fill #0

## 2021-08-13 MED ORDER — CARVEDILOL 12.5 MG PO TABS
12.5000 mg | ORAL_TABLET | Freq: Two times a day (BID) | ORAL | Status: DC
  Administered 2021-08-13: 12.5 mg via ORAL
  Filled 2021-08-13: qty 1

## 2021-08-13 MED ORDER — HYDRALAZINE HCL 25 MG PO TABS
25.0000 mg | ORAL_TABLET | Freq: Once | ORAL | Status: AC
  Administered 2021-08-13: 25 mg via ORAL
  Filled 2021-08-13: qty 1

## 2021-08-13 MED ORDER — SODIUM CHLORIDE 0.9% FLUSH
3.0000 mL | Freq: Two times a day (BID) | INTRAVENOUS | Status: DC
  Administered 2021-08-13: 3 mL via INTRAVENOUS

## 2021-08-13 NOTE — ED Triage Notes (Signed)
Pt bib [facility] from home with complaints of hypoglycemia. Pt found to have CBG 20 on arrival, pt given 25gm D10. Pt reports he has been taking all of his regular medicine as prescribed but unable to report what and when he takes it. AOx4

## 2021-08-13 NOTE — ED Notes (Signed)
Got patient undressed on the monitor did ekg shown to er provider patient is resting with nurse at bedside and call bell in reach

## 2021-08-13 NOTE — ED Notes (Signed)
Pt discharged and wheeled out of the ED in a wheel chair without difficulty. 

## 2021-08-13 NOTE — Discharge Instructions (Signed)
Make sure to decrease your Lantus to 35 mg at night as opposed to the 40 mg that you  took.  Make sure to check your blood sugar regularly at least 3 times during the day.  Keep a record of your blood sugar and follow-up with your doctor this coming week to recheck on your medication regimen.  Increase your hydralazine to 50 mg 3 times daily

## 2021-08-13 NOTE — ED Provider Notes (Signed)
[facility] EMERGENCY DEPARTMENT Provider Note   CSN: [id number] Arrival date & time: 08/13/21  1447     History No chief complaint on file.   Jeffrey [name] is a 55 y.o. male.  HPI   Pt remembers going to bed last night around 1 am.  The next thing he remembers is the ambulance arriving.  Pt does not remember when he took his medications.  His girlfriend usually handles that.  She may have given it to him this morning, he is not sure.  No fever or chills.  No vomiting.  Pt states his appetitie is fine.  EMS found the pt with a CBG of 20.  He was given 25 gm of D10.  Pt states he feels fine right now.  Past Medical History:  Diagnosis Date   AICD (automatic cardioverter/defibrillator) present    Medtronic   AICD (automatic cardioverter/defibrillator) present    MDT [facility] AF MRI   Anemia    CAD (coronary artery disease)    a. cath 01/31/17: 60% 1st RPLB, 60% dist RCA, 55% prox RCA, 10% pro LAD --> Rx TX.    CHF (congestive heart failure) (HCC)    Chronic systolic CHF (congestive heart failure) ([place]) 01/28/2017   1. Echo 01/29/17:  EF 20-25, normal wall motion, mild LAE // 2. EF 10-15 by [facility] 01/2017    Coronary artery disease    Diabetes mellitus    type II   Diabetes mellitus without complication ([place])    Diabetic foot infection ([place]) 03/2016   RT FOOT   Dyspnea    History of kidney stones    passed   History of kidney stones    HTN (hypertension)    Hyperlipidemia    Hypertension    Myocardial infarction [facility]), although reported, NICM at cath    NICM (nonischemic cardiomyopathy) ([place]) 02/15/2017   1. Mod non-obs CAD on LHC in 01/2017 - CAD does not explain cardiomyopathy   Renal disorder     Patient Active Problem List   Diagnosis Date Noted   Elevated troponin 07/01/2021   Seizure ([place]) 07/01/2021   Acute stroke due to ischemia [facility])    Hyperglycemia 06/30/2021   Prolonged QT interval 06/30/2021   Acute on chronic renal failure ([place])  06/30/2021   Hypokalemia 06/30/2021   Hyperosmolar hyperglycemic state (HHS) ([place]) 09/32/6712   Chronic systolic CHF (congestive heart failure) ([place]) 06/30/2021   Essential hypertension 06/30/2021   Uncontrolled type 2 DM with hyperosmolar nonketotic hyperglycemia ([place]) 06/30/2021   Anemia due to chronic kidney disease 06/30/2021   CKD (chronic kidney disease), stage III ([place]) 06/30/2021   CAD (coronary artery disease) 06/30/2021   Fall due to seizure ([place]) 06/30/2021   Nephrotic syndrome    Hyperglycemia due to diabetes mellitus ([place]) 06/12/2020   Hypoglycemia 45/80/9983   Acute metabolic encephalopathy 38/25/0539   S/P ICD (internal cardiac defibrillator) procedure 01/19/2018   Cardiac device in situ    Syncope and collapse 01/13/2018   NICM (nonischemic cardiomyopathy) ([place]) 02/15/2017   Coronary artery disease involving native coronary artery of native heart without angina pectoris 02/15/2017   Hyperlipidemia 02/15/2017   Chronic combined systolic (congestive) and diastolic (congestive) heart failure ([place]) 76/73/4193   Folliculitis 79/01/4096   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 ([place]) 35/32/9924   Anemia, iron deficiency    Noncompliance with medication regimen 03/30/2016   Uncontrolled type 2 diabetes mellitus with hyperglycemia, with long-term current use of insulin ([place])  Cellulitis 09/25/2015   Essential hypertension 08/21/2013   Diabetic neuropathy ([place]) 08/21/2013   Fungal toenail infection 08/21/2013    Past Surgical History:  Procedure Laterality Date   AIR/FLUID EXCHANGE Right 07/14/2020   Procedure: AIR/FLUID EXCHANGE;  Surgeon: Jalene Mullet, MD;  Location: [facility];  Service: Ophthalmology;  Laterality: Right;   AMPUTATION Right 04/01/2016   Procedure: Right Great Toe Amputation;  Surgeon: Newt Minion, MD;  Location: [facility];  Service: Orthopedics;  Laterality: Right;   AMPUTATION Right 06/19/2016   Procedure: AMPUTATION SECOND TOE;  Surgeon:  Marybelle Killings, MD;  Location: [facility];  Service: Orthopedics;  Laterality: Right;   BACK SURGERY     for abscess   CARDIAC CATHETERIZATION     ICD IMPLANT N/A 01/15/2018   Procedure: ICD IMPLANT;  Surgeon: Deboraha Sprang, MD;  Location: [place] CV [facility];  Service: Cardiovascular;  Laterality: N/A;   INJECTION OF SILICONE OIL Right 04/14/6143   Procedure: INJECTION OF SILICONE OIL;  Surgeon: Jalene Mullet, MD;  Location: [facility];  Service: Ophthalmology;  Laterality: Right;   INJECTION OF SILICONE OIL Right 02/24/4007   Procedure: INJECTION OF SILICONE OIL;  Surgeon: Jalene Mullet, MD;  Location: [facility];  Service: Ophthalmology;  Laterality: Right;   INSERT / REPLACE / REMOVE PACEMAKER     MEMBRANE PEEL Right 08/25/2020   Procedure: MEMBRANE PEEL;  Surgeon: Jalene Mullet, MD;  Location: [facility];  Service: Ophthalmology;  Laterality: Right;   PARS PLANA VITRECTOMY Right 07/14/2020   Procedure: PARS PLANA VITRECTOMY WITH 25 GAUGE, Membranetomy, drainage of subretinal fluid;  Surgeon: Jalene Mullet, MD;  Location: [facility];  Service: Ophthalmology;  Laterality: Right;   PARS PLANA VITRECTOMY Right 08/25/2020   Procedure: PARS PLANA VITRECTOMY WITH 25 GAUGE;  Surgeon: Jalene Mullet, MD;  Location: [facility] [place];  Service: Ophthalmology;  Laterality: Right;   PHOTOCOAGULATION WITH LASER Right 07/14/2020   Procedure: PHOTOCOAGULATION WITH LASER;  Surgeon: Jalene Mullet, MD;  Location: [place];  Service: Ophthalmology;  Laterality: Right;   PHOTOCOAGULATION WITH LASER Right 08/25/2020   Procedure: PHOTOCOAGULATION WITH LASER;  Surgeon: Jalene Mullet, MD;  Location: [facility];  Service: Ophthalmology;  Laterality: Right;   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   Procedure: REPAIR OF HEMORRHAGIC DETACHMENT;  Surgeon: Jalene Mullet, MD;  Location: [facility];  Service: Ophthalmology;  Laterality: Right;   RIGHT/LEFT HEART CATH AND CORONARY ANGIOGRAPHY N/A 01/31/2017   Procedure: Right/Left Heart Cath  and Coronary Angiography;  Surgeon: Troy Sine, MD;  Location: [place] [facility];  Service: Cardiovascular;  Laterality: N/A;   SILICON OIL REMOVAL Right 6/76/1950   Procedure: SILICON OIL REMOVAL;  Surgeon: Jalene Mullet, MD;  Location: [place] [facility];  Service: Ophthalmology;  Laterality: Right;       Family History  Problem Relation Age of Onset   Diabetes Mother    Hypertension Mother    Diabetes Father    Heart attack Father    Diabetes Sister    Heart attack Maternal Grandmother     Social History   Tobacco Use   Smoking status: Never   Smokeless tobacco: Never  Vaping Use   Vaping Use: Never used  Substance Use Topics   Alcohol use: Never   Drug use: Never    Home Medications Prior to Admission medications   Medication Sig Start Date End Date Taking? Authorizing Provider  albuterol (PROVENTIL HFA;VENTOLIN HFA) 108 (90 Base) MCG/ACT inhaler Inhale 1-2 puffs into the lungs every 6 (  six) hours as needed for wheezing or shortness of breath. 11/15/16  Yes Recardo Evangelist, PA-C  aspirin 81 MG EC tablet Take 1 tablet (81 mg total) by mouth daily. Swallow whole. 07/03/21  Yes Mariel Aloe, MD  carvedilol (COREG) 12.5 MG tablet TAKE 1 TABLET (12.5 MG TOTAL) BY MOUTH 2 (TWO) TIMES DAILY WITH A MEAL. Patient taking differently: Take 12.5 mg by mouth 2 (two) times daily with a meal. 03/11/21 03/11/22 Yes Tillery, Satira Mccallum, PA-C  furosemide (LASIX) 40 MG tablet TAKE 1.5 TABLETS (60 MG TOTAL) BY MOUTH DAILY. Patient taking differently: Take 60 mg by mouth in the morning. 01/13/21 01/13/22 Yes Newlin, Charlane Ferretti, MD  gabapentin (NEURONTIN) 400 MG capsule TAKE 1 CAPSULE BY MOUTH 3 TAKE TIMES DAILY. Patient taking differently: Take 400 mg by mouth 3 (three) times daily. 01/13/21 01/13/22 Yes Newlin, Charlane Ferretti, MD  insulin aspart (NOVOLOG FLEXPEN) 100 UNIT/ML FlexPen Inject 0-9 Units into the skin 3 (three) times daily with meals. CBG 70 - 120: 0 units  CBG 121 - 150: 1 unit  CBG 151 - 200: 2  units  CBG 201 - 250: 3 units  CBG 251 - 300: 5 units  CBG 301 - 350: 7 units  CBG 351 - 400 9 units Patient taking differently: Inject 0-9 Units into the skin See admin instructions. Inject 0-9 units into the skin 3 times a day with meals, PER SLIDING SCALE:  CBG 70 - 120 =  0 unit CBG 121 - 150: 1 unit  CBG 151 - 200: 2 units  CBG 201 - 250: 3 units  CBG 251 - 300: 5 units  CBG 301 - 350: 7 units  CBG 351 - 400 9 units 08/07/21  Yes Adhikari, Tamsen Meek, MD  isosorbide mononitrate (IMDUR) 30 MG 24 hr tablet Take 1 tablet (30 mg total) by mouth daily. 01/27/21  Yes Shirley Friar, PA-C  LANTUS SOLOSTAR 100 UNIT/ML Solostar Pen Inject 35 Units into the skin daily. Patient taking differently: Inject 35 Units into the skin at bedtime. 08/08/21  Yes Shelly Coss, MD  ondansetron (ZOFRAN) 4 MG tablet Take 1 tablet (4 mg total) by mouth every 6 (six) hours as needed for nausea or vomiting. 27/25/36  Yes Delora Fuel, MD  potassium chloride SA (KLOR-CON) 20 MEQ tablet Take 2 tablets (40 mEq total) by mouth daily. 08/07/21  Yes Shelly Coss, MD  rosuvastatin (CRESTOR) 40 MG tablet TAKE 1 TABLET (40 MG TOTAL) BY MOUTH DAILY. Patient taking differently: Take 40 mg by mouth at bedtime. 01/13/21 01/13/22 Yes Charlott Rakes, MD  Accu-Chek Softclix Lancets lancets Use to check blood sugar three times daily E11.65 08/12/21   Charlott Rakes, MD  Blood Glucose Monitoring Suppl (ACCU-CHEK GUIDE) w/Device KIT Use to check blood sugar three times daily E11.65 08/12/21   Charlott Rakes, MD  cetirizine (ZYRTEC) 10 MG tablet TAKE 1 TABLET (10 MG TOTAL) BY MOUTH DAILY. 07/27/20   Charlott Rakes, MD  ferrous sulfate 325 (65 FE) MG tablet Take 1 tablet (325 mg total) by mouth 2 (two) times daily with a meal. 04/03/16   Dhungel, Nishant, MD  glucose blood (ACCU-CHEK GUIDE) test strip Use to check blood sugar three times daily E11.65 08/12/21   Charlott Rakes, MD  hydrALAZINE (APRESOLINE) 25 MG tablet Take 2 tablets  (50 mg total) by mouth 3 (three) times daily. 08/13/21 08/13/22  Dorie Rank, MD  Insulin Pen Needle (BD PEN NEEDLE NANO U/F) 32G X 4 MM MISC USE TO INJECT LANTUS  DAILY. MUST USE NEW PEN NEEDLE WITH EACH INJECTION. 07/27/21   Charlott Rakes, MD  magnesium oxide (MAG-OX) 400 MG tablet Take 1 tablet (400 mg total) by mouth daily. 08/07/21   Shelly Coss, MD    Allergies    Patient has no known allergies.  Review of Systems   Review of Systems  All other systems reviewed and are negative.  Physical Exam Updated Vital Signs BP (!) 173/98   Pulse 74   Temp 97.8 F (36.6 C) (Oral)   Resp (!) 23   Ht 1.702 m ('5\' 7"' )   Wt 99.7 kg   SpO2 98%   BMI 34.43 kg/m   Physical Exam Vitals and nursing note reviewed.  Constitutional:      Appearance: He is well-developed. He is not ill-appearing, toxic-appearing or diaphoretic.  HENT:     Head: Normocephalic and atraumatic.     Right Ear: External ear normal.     Left Ear: External ear normal.  Eyes:     General: No scleral icterus.       Right eye: No discharge.        Left eye: No discharge.     Conjunctiva/sclera: Conjunctivae normal.  Neck:     Trachea: No tracheal deviation.  Cardiovascular:     Rate and Rhythm: Normal rate and regular rhythm.  Pulmonary:     Effort: Pulmonary effort is normal. No respiratory distress.     Breath sounds: Normal breath sounds. No stridor. No wheezing or rales.  Abdominal:     General: Bowel sounds are normal. There is no distension.     Palpations: Abdomen is soft.     Tenderness: There is no abdominal tenderness. There is no guarding or rebound.  Musculoskeletal:        General: No tenderness or deformity.     Cervical back: Neck supple.  Skin:    General: Skin is warm and dry.     Findings: No rash.  Neurological:     General: No focal deficit present.     Mental Status: He is alert.     Cranial Nerves: No cranial nerve deficit (no facial droop, extraocular movements intact, no slurred  speech).     Sensory: No sensory deficit.     Motor: No abnormal muscle tone or seizure activity.     Coordination: Coordination normal.     Comments: Not confused, alert and awake, answering questions appropriately  Psychiatric:        Mood and Affect: Mood normal.    ED Results / Procedures / Treatments   Labs (all labs ordered are listed, but only abnormal results are displayed) Labs Reviewed  BASIC METABOLIC PANEL - Abnormal; Notable for the following components:      Result Value   Potassium 3.1 (*)    Glucose, Bld 105 (*)    BUN 26 (*)    Creatinine, Ser 1.71 (*)    Calcium 8.8 (*)    GFR, Estimated 46 (*)    All other components within normal limits  CBC - Abnormal; Notable for the following components:   Hemoglobin 11.8 (*)    HCT 38.6 (*)    MCH 25.1 (*)    All other components within normal limits  URINALYSIS, ROUTINE W REFLEX MICROSCOPIC - Abnormal; Notable for the following components:   Color, Urine STRAW (*)    Hgb urine dipstick SMALL (*)    Protein, ur >=300 (*)    Bacteria, UA RARE (*)  All other components within normal limits  CBG MONITORING, ED - Abnormal; Notable for the following components:   Glucose-Capillary 100 (*)    All other components within normal limits  CBG MONITORING, ED - Abnormal; Notable for the following components:   Glucose-Capillary 131 (*)    All other components within normal limits  CBG MONITORING, ED  CBG MONITORING, ED    EKG EKG Interpretation  Date/Time:  Friday August 13 2021 14:53:19 EDT Ventricular Rate:  79 PR Interval:  180 QRS Duration: 88 QT Interval:  423 QTC Calculation: 485 R Axis:   9 Text Interpretation: Sinus rhythm Nonspecific T abnormalities, lateral leads Borderline prolonged QT interval No significant change since last tracing Confirmed by Dorie Rank [phone]) on 08/13/2021 6:30:09 PM  Radiology No results found.  Procedures Procedures   Medications Ordered in ED Medications  sodium  chloride flush (NS) 0.9 % injection 3 mL (3 mLs Intravenous Given 08/13/21 1528)  sodium chloride flush (NS) 0.9 % injection 3 mL (has no administration in time range)  0.9 %  sodium chloride infusion (has no administration in time range)  carvedilol (COREG) tablet 12.5 mg (12.5 mg Oral Given 08/13/21 1532)  hydrALAZINE (APRESOLINE) tablet 25 mg (25 mg Oral Given 08/13/21 1532)  hydrALAZINE (APRESOLINE) tablet 25 mg (25 mg Oral Given 08/13/21 1852)    ED Course  I have reviewed the triage vital signs and the nursing notes.  Pertinent labs & imaging results that were available during my care of the patient were reviewed by me and considered in my medical decision making (see chart for details).  Clinical Course as of 08/13/21 2001  Fri Aug 13, 2021  1726 Labs show table hemoglobin. [JK]  1726 Chronic renal sufficiency is stable. [JK]  1726 Initial glucose is stable at 100 [JK]  1727 Blood pressure is improving after his blood pressure medications were administered [JK]  1835 Patient remained stable.  No confusion.  Family does mention some urinary frequency.  Will order urinalysis [JK]  1835 Blood sugar remained stable at 131 [JK]  1945 Urinalysis does not suggest infection.  Hemoglobin is stable. [JK]    Clinical Course User Index [JK] Dorie Rank, MD   MDM Rules/Calculators/A&P                           Patient presented to the ED for evaluation of recurrent hypoglycemia.  Patient was recently in hospital for similar symptoms.  Patient was monitored in the ED for several hours.  No signs of acute infection.  Blood sugar has remained stable.  No signs of recurrent hypoglycemia.  Patient did take 40 units last evening.  According to his records he supposed to be back down to 35.  I discussed that with the patient and again recommended to take 35 units instead of the 40.  We also discussed the importance of checking her blood sugar regularly during the day.  Patient was also known to be  hypertensive.  I did give an additional dose of Solazine.  I will have him increase his hydralazine to 50 mg 3 times daily.  Findings and plan were discussed with the patient and his family. Final Clinical Impression(s) / ED Diagnoses Final diagnoses:  Hypoglycemia  Hypertension, unspecified type    Rx / DC Orders ED Discharge Orders          Ordered    hydrALAZINE (APRESOLINE) 25 MG tablet  3 times daily  08/13/21 2001             Dorie Rank, MD 08/13/21 2003

## 2021-08-17 ENCOUNTER — Other Ambulatory Visit: Payer: Self-pay

## 2021-08-23 ENCOUNTER — Telehealth: Payer: Self-pay | Admitting: Neurology

## 2021-08-23 ENCOUNTER — Ambulatory Visit: Payer: Medicare Other | Admitting: Neurology

## 2021-08-23 NOTE — Telephone Encounter (Signed)
Pt's wife called, had to reschedule her husbands appt. Was up and down all night due to his BP.

## 2021-08-24 ENCOUNTER — Other Ambulatory Visit: Payer: Self-pay

## 2021-09-01 ENCOUNTER — Encounter (HOSPITAL_COMMUNITY): Payer: Self-pay | Admitting: Ophthalmology

## 2021-09-01 NOTE — Progress Notes (Signed)
Spoke with Wife Jeffrey [name] for [employer] and instructions for DOS.  DUE TO COVID-19 ONLY ONE VISITOR IS ALLOWED TO COME WITH YOU AND STAY IN THE WAITING ROOM ONLY DURING PRE OP AND PROCEDURE DAY OF SURGERY.   Internal Med - Dr Charlott Rakes Cardiologist - Dr Mertie Moores Electrophysiologist - Dr Virl Axe  Chest x-ray - 06/30/21 (1V) EKG - 08/17/21 Stress Test - n/a ECHO - 07/01/21 Cardiac Cath - 01/31/17  ICD - Medtronic Defibrillator.  Request sent to [facility].  Perioperative Prescription for ICD Programming was faxed and awaiting results.  Emailed sent to [employer] Rep [place] and cc Jovita Kussmaul, RN.  Last remote device check was on 07/02/21.   Sleep Study -  n/a CPAP - none  Fasting Blood Sugar - unknown Checks Blood Sugar 3 times a day  THE NIGHT BEFORE SURGERY, take 17 units of Lantus Insulin.        THE MORNING OF SURGERY, do not take Novolog Insulin unless your CBG is greater than 220 mg/dL.  If greater than 220, then you may take  of your sliding scale (correction) dose of insulin.  If your blood sugar is less than 70 mg/dL, you will need to treat for low blood sugar: Treat a low blood sugar (less than 70 mg/dL) with  cup of clear juice (cranberry or apple), 4 glucose tablets, OR glucose gel. Recheck blood sugar in 15 minutes after treatment (to make sure it is greater than 70 mg/dL). If your blood sugar is not greater than 70 mg/dL on recheck, call [phone] for further instructions.  Aspirin Instructions: Follow your surgeon's instructions on when to stop aspirin prior to surgery,  If no instructions were given by your surgeon then you will need to call the office for those instructions.  ERAS: Clear liquids til 11:15 am on DOS  Anesthesia review: Yes  STOP now taking any Aspirin (unless otherwise instructed by your surgeon), Aleve, Naproxen, Ibuprofen, Motrin, Advil, Goody's, BC's, all herbal medications, fish oil, and all vitamins.    Coronavirus Screening Covid test n/a Ambulatory Surgery  Does patient have any of the following symptoms:  Cough yes/no: No Fever (>100.35F)  yes/no: No Runny nose yes/no: No Sore throat yes/no: No Difficulty breathing/shortness of breath  yes/no: No  Has patient traveled in the last 14 days and where? yes/no: No  Wife Jeffrey [name] verbalized understanding of instructions that were given via phone.

## 2021-09-02 ENCOUNTER — Encounter (HOSPITAL_COMMUNITY): Payer: Self-pay | Admitting: Physician Assistant

## 2021-09-02 ENCOUNTER — Encounter: Payer: Self-pay | Admitting: Internal Medicine

## 2021-09-02 NOTE — Anesthesia Preprocedure Evaluation (Deleted)
Anesthesia Evaluation    Airway        Dental   Pulmonary           Cardiovascular hypertension,      Neuro/Psych    GI/Hepatic   Endo/Other  diabetes  Renal/GU      Musculoskeletal   Abdominal   Peds  Hematology   Anesthesia Other Findings   Reproductive/Obstetrics                             Anesthesia Physical Anesthesia Plan  ASA:   Anesthesia Plan:    Post-op Pain Management:    Induction:   PONV Risk Score and Plan:   Airway Management Planned:   Additional Equipment:   Intra-op Plan:   Post-operative Plan:   Informed Consent:   Plan Discussed with:   Anesthesia Plan Comments: (PAT note written 09/02/2021 by Myra Gianotti, PA-C. )        Anesthesia Quick Evaluation

## 2021-09-02 NOTE — Progress Notes (Signed)
[facility] DEVICE PROGRAMMING  Patient Information: Name:  DOREON OSADA  DOB:  1966/07/09  MRN:  [id number]    Berneta Sages, RN  P Cv Div Heartcare Device Planned Procedure:  PARS PLANA VITRECTOMY WITH 25 GAUGE - Left  PHOTOCOAGULATION WITH ENDO LASER - Left  Surgeon:  Dr Jalene Mullet  Date of Procedure:  09/07/21  Cautery will be used.  Position during surgery:  Supine   Please send documentation back to:  [facility] (Fax # [phone])   Marveen Reeks, RN  09/01/2021 1:53 PM  Device Information:  Clinic EP Physician:  Virl Axe, MD   Device Type:  Defibrillator Manufacturer and Phone #:  Medtronic: [phone] Pacemaker Dependent?:  No. Date of Last Device Check:  07/11/21 Normal Device Function?:  Yes.    Electrophysiologist's Recommendations:  Have magnet available. Provide continuous ECG monitoring when magnet is used or reprogramming is to be performed.  Procedure may interfere with device function.  Magnet should be placed over device during procedure.  Per Device Clinic Standing Orders, York Ram, RN  2:24 PM 09/02/2021

## 2021-09-02 NOTE — Progress Notes (Signed)
Anesthesia Chart Review: SAME DAY WORK-UP  Case: [id number] Date/Time: 09/07/21 1400   Procedures:      PARS PLANA VITRECTOMY WITH 25 GAUGE (Left)     PHOTOCOAGULATION WITH ENDO LASER (Left)   Anesthesia type: Monitor Anesthesia Care   Pre-op diagnosis: LEFT EYE RETINAL DETACHMENT   Location: [place] OR ROOM 08 / [place] [facility]   Surgeons: Jalene Mullet, MD       DISCUSSION: Patient is a 55 year old male scheduled for the above procedure.  Surgery was initially scheduled for 08/03/2021, but on the day of surgery he was found unresponsive at home by his girlfriend. CBG 35-->123 after 25 gm of D10. He reported not eating or taking any insulin coverage that morning. He was taken to the ED for further evaluation. He returned to baseline with correction of his hypoglycemia. His BP was elevated 194/104, family/friend confirm he was not always compliant with taking medications. Home BP medications ordered/given and discharged home. He had another hypoglycemic event (20's, unresponsive at home) 08/06/21 and was admitted overnight. Labs showed worsening Creatinine (thought to be new baseline) with hypomagnesemia and hypokalemia, supplemented. They found he was taking more insulin than needed (45 units instead of 40 units). DM Coordinator consulted with recommendation to decrease Lantus to 35 units and sliding scale to 0-9 units (down from 0-15 units) 3 times daily and monitor his sugars closely at home. Entresto and spironolactone remained on hold per cardiology given CKD. Out-patient follow-up recommended.    History includes never smoker, DM2, HTN, HLD, CAD (non-obstructive, 1829), chronic systolic CHF (diagnosed 9371), nonischemic cardiomyopathy, ICD (Medtronic [id number] Visia AF MRI VR ICD 01/15/18), dyspnea, CKD (stage 3a), anemia, diabetic foot ulcer (s/p right great and 2nd toe amputations 2017), eye surgery (right para plan vitrectomy 07/14/20; repair hemorrhagic detached right retina 08/25/20), back surgery.  Positive  COVID-19 12/03/2019 & 12/21/20.  Reported he is scheduled for sleep study but does not currently use any O2 or CPAP for sleep.   - [place] ED visit for hypoglycemia (20's). Glucose stabilized after 25 gm D10). No signs of infection. Labs felt stable. He had not decreased his Lantus dose as previously recommended, so advised to decrease Lantus to 35 units.  - Admission to [facility] 08/06/21-08/07/21 for recurrent hypoglycemic event as described above. ED visit 08/03/21 for hypoglycemia.  - Admission to [facility] 06/30/21-07/04/21 for hyperosmolar hyperglycemic state with seizure-like activity believed secondary to hyperglycemia and hypokalemia. Reportedly seizure in setting of hyperglycemia years ago as well. In the ED he was noted to have significant electrolyte abnormalities with a glucose > 700 and his potassium was 2.7.  BP 201/104.  He has not been compliant with his medications or diet recently.  He was treated with potassium, insulin, D5 LR, NS, MgSO4.  A1c 14.2 %, Lantus and SSI resumed. EEG normal. 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 No evidence of mass effect or hemorrhage. Neurology consulted. Echo showed no thrombus and MRA head/neck showed no significant stenosis or occlusions.  Seizures felt provoked, so no anti-seizure medication recommended. Acute/subacute infarcts felt likely due to intracellular dehydration from severe hyperglycemia. ASA/Plavix for 3 weeks followed by ASA alone recommended and well as aggressive risk factor modification.  Cardiology also consulted for CHF and uncontrolled hypertension.  Not felt to be significantly volume overloaded on exam.  Home BP medications resumed although Entresto and spironolactone held for acute on  chronic kidney disease.  Echo showed stable LVEF 30 to 35%.   As above A1c 14.2% 06/30/21, prompting increase in his Lantus and NovoLog, but then had recurrent significant hypoglycemic events, so was advised to decreased Lantus to 35 units daily (takes at bedtime) and 0-9 units NovoLog for SSI. Last hypoglycemic event 08/13/21. He monitors his home CBGs 3 times per daily. PAT instructions included instruction on how he can management day of surgery CBGs > 220 or < 70. He will get a CBG along with other labs as indicated on arrival for surgery.    Perioperative cardiac device Rx is still pending. He has a Medtronic ICD. He was not pacer dependent as of 07/11/21.    VS: Per 08/13/21 ED visit: BP (!) 173/98   Pulse 74   Temp 97.8 F (36.6 C) (Oral)   Resp (!) 23   Ht 1.702 m (5' 7")   Wt 99.7 kg   SpO2 98%   BMI 34.43 kg/m    PROVIDERS: Charlott Rakes, MD is PCP. Last evaluation 01/07/20.  Virl Axe, MD is EP cardiologist. Last office evaluation 03/11/21 by Barrington Ellison, PA-C. His HF has failed to improved despite titration of guideline directed medication, so was being considered for the [facility] NEO device.  He apparently failed the screening for the [facility] study so referral sent to vascular surgeon Brabham, V. Rock Nephew, MD for further evaluation. In patient evaluation by Skeet Latch, MD during July 2022 admission as above.   Nahser, Arnette Norris, MD is listed as primary cardiologist, but primarily is followed by EP.   LABS: For day of surgery as indicated. Last lab results include: Lab Results  Component Value Date   WBC 7.4 08/13/2021   HGB 11.8 (L) 08/13/2021   HCT 38.6 (L) 08/13/2021   PLT 291 08/13/2021   GLUCOSE 105 (H) 08/13/2021   ALT 24 08/06/2021   AST 25 08/06/2021   NA 139 08/13/2021   K 3.1 (L) 08/13/2021   CL 105 08/13/2021   CREATININE 1.71 (H) 08/13/2021   BUN 26 (H) 08/13/2021   CO2 24 08/13/2021   TSH 0.709 07/01/2021   HGBA1C 14.2 (H) 06/30/2021     OTHER: EEG 07/01/21: IMPRESSION: This study is within normal limits. No seizures or epileptiform discharges were seen throughout the recording.     IMAGES: MRA Head 07/02/21: IMPRESSION: Negative head MRA.   MRA Neck 07/02/21: IMPRESSION: 1. Study limitations as detailed above [see full report] primarily affecting assessment of the proximal portions of the common carotid and vertebral arteries. 2. No evidence of significant cervical carotid or vertebral artery stenosis or dissection where the vessels were adequately visualized.   1V PCXR 06/30/21: FINDINGS: - The heart size and mediastinal contours are unchanged. Left chest wall single lead cardiac device in stable position. - Persistent left base atelectasis with minimally elevated left hemidiaphragm. No focal consolidation. No pulmonary edema. No pleural effusion. No pneumothorax. - No acute osseous abnormality. IMPRESSION: No active disease.     EKG: 08/13/21: Sinus rhythm Nonspecific T abnormalities, lateral leads Borderline prolonged QT interval [QT 423 ms, QTc 485 ms] No significant change since last tracing Confirmed by Dorie Rank [phone]) on 08/13/2021 6:30:09 PM   CV: Echo 07/01/21: IMPRESSIONS   1. Left ventricular ejection fraction, by estimation, is 30 to 35%. The  left ventricle has moderately decreased function. The left ventricle  demonstrates global hypokinesis. Left ventricular diastolic parameters are  consistent with Grade I diastolic  dysfunction (impaired relaxation).   2. Right ventricular systolic function is normal. The right ventricular  size is normal. There is normal pulmonary artery systolic pressure.   3. The mitral valve is grossly normal. No evidence of mitral valve  regurgitation. No evidence of mitral stenosis.   4. The aortic valve was not well visualized. There is mild thickening of  the aortic valve. Aortic valve regurgitation is not visualized. Mild  aortic valve sclerosis is  present, with no evidence of aortic valve  stenosis.   5. The inferior vena cava is normal in size with greater than 50%  respiratory variability, suggesting right atrial pressure of 3 mmHg.  (Comparison: LVEF 20-25% 01/29/17 echo; 10-15% 01/31/17 cath; LVEF 20% dffuse hypokinesis, grade 2 DD, RV systolic functio nmoderately-severely reduced 01/14/18 echo; LVEF 30-35%, normal RVSF 06/13/20 echo; LVEF 30-35%, grade 2 DD, normal RVSF 03/30/21 echo)     US Carotid 03/30/21: Summary:  Right Carotid: Velocities in the right ICA are consistent with a 1-39% stenosis.  Left Carotid: Velocities in the left ICA are consistent with a 1-39% stenosis.  Vertebrals:  Bilateral vertebral arteries demonstrate antegrade flow.  Subclavians: Normal flow hemodynamics were seen in bilateral subclavian arteries.      Cardiac cath 01/31/17: 1st RPLB lesion, 60 %stenosed. Dist RCA lesion, 60 %stenosed. Prox RCA lesion, 55 %stenosed. Prox LAD lesion, 10 %stenosed. There is severe left ventricular systolic dysfunction. LV end diastolic pressure is mildly elevated. - Severe global LV dysfunction with an ejection fraction of 10-15%.  The pattern is one of a nonischemic cardiomyopathy. - Mild coronary obstructive disease with smooth 10% narrowing in the LAD; normal ramus intermediate, normal left circumflex; and RCA with 50-60% proximal stenosis and distal tapering of 60%.  Prior to giving rise to 3 small distal branches with 60% narrowing in a small inferior LV branch.  The patient's LV dysfunction is out of proportion to his CAD. - Initiation of medical therapy with carvedilol, spironolactone, and probable initiation of angiotension receptorblocker/neprilysin inhibition therapy.  Consider short-term life-vest prior to discharge to allow for possible medication induced improvement in LV function. [S/p ICD 01/15/18]   Past Medical History:  Diagnosis Date   AICD (automatic cardioverter/defibrillator) present    Medtronic    AICD (automatic cardioverter/defibrillator) present    MDT Visia AF MRI   Anemia    CAD (coronary artery disease)    a. cath 01/31/17: 60% 1st RPLB, 60% dist RCA, 55% prox RCA, 10% pro LAD --> Rx TX.    CHF (congestive heart failure) (HCC)    Chronic systolic CHF (congestive heart failure) ([place]) 01/28/2017   1. Echo 01/29/17:  EF 20-25, normal wall motion, mild LAE // 2. EF 10-15 by [facility] 01/2017    Coronary artery disease    Diabetes mellitus    type II   Diabetes mellitus without complication ([place])    Diabetic foot infection ([place]) 03/2016   RT FOOT   Dyspnea    History of kidney stones    passed   History of kidney stones    HTN (hypertension)    Hyperlipidemia    Hypertension    Myocardial infarction [facility]), although reported, NICM at cath    NICM (nonischemic cardiomyopathy) ([place]) 02/15/2017   1. Mod non-obs CAD on LHC in 01/2017 - CAD does not explain cardiomyopathy   Renal disorder     Past Surgical History:  Procedure Laterality Date  AIR/FLUID EXCHANGE Right 07/14/2020   Procedure: AIR/FLUID EXCHANGE;  Surgeon: Jalene Mullet, MD;  Location: [place];  Service: Ophthalmology;  Laterality: Right;   AMPUTATION Right 04/01/2016   Procedure: Right Great Toe Amputation;  Surgeon: Newt Minion, MD;  Location: [place];  Service: Orthopedics;  Laterality: Right;   AMPUTATION Right 06/19/2016   Procedure: AMPUTATION SECOND TOE;  Surgeon: Marybelle Killings, MD;  Location: [place];  Service: Orthopedics;  Laterality: Right;   BACK SURGERY     for abscess   CARDIAC CATHETERIZATION     ICD IMPLANT N/A 01/15/2018   Procedure: ICD IMPLANT;  Surgeon: Deboraha Sprang, MD;  Location: [place] CV LAB;  Service: Cardiovascular;  Laterality: N/A;   INJECTION OF SILICONE OIL Right 0/05/3015   Procedure: INJECTION OF SILICONE OIL;  Surgeon: Jalene Mullet, MD;  Location: [place];  Service: Ophthalmology;  Laterality: Right;   INJECTION OF SILICONE OIL Right 0/09/9322   Procedure: INJECTION OF SILICONE OIL;   Surgeon: Jalene Mullet, MD;  Location: [place];  Service: Ophthalmology;  Laterality: Right;   INSERT / REPLACE / REMOVE PACEMAKER     MEMBRANE PEEL Right 08/25/2020   Procedure: MEMBRANE PEEL;  Surgeon: Jalene Mullet, MD;  Location: [place];  Service: Ophthalmology;  Laterality: Right;   PARS PLANA VITRECTOMY Right 07/14/2020   Procedure: PARS PLANA VITRECTOMY WITH 25 GAUGE, Membranetomy, drainage of subretinal fluid;  Surgeon: Jalene Mullet, MD;  Location: [place];  Service: Ophthalmology;  Laterality: Right;   PARS PLANA VITRECTOMY Right 08/25/2020   Procedure: PARS PLANA VITRECTOMY WITH 25 GAUGE;  Surgeon: Jalene Mullet, MD;  Location: [place];  Service: Ophthalmology;  Laterality: Right;   PHOTOCOAGULATION WITH LASER Right 07/14/2020   Procedure: PHOTOCOAGULATION WITH LASER;  Surgeon: Jalene Mullet, MD;  Location: [place];  Service: Ophthalmology;  Laterality: Right;   PHOTOCOAGULATION WITH LASER Right 08/25/2020   Procedure: PHOTOCOAGULATION WITH LASER;  Surgeon: Jalene Mullet, MD;  Location: [place];  Service: Ophthalmology;  Laterality: Right;   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   Procedure: REPAIR OF HEMORRHAGIC DETACHMENT;  Surgeon: Jalene Mullet, MD;  Location: [place];  Service: Ophthalmology;  Laterality: Right;   RIGHT/LEFT HEART CATH AND CORONARY ANGIOGRAPHY N/A 01/31/2017   Procedure: Right/Left Heart Cath and Coronary Angiography;  Surgeon: Troy Sine, MD;  Location: [place] CV LAB;  Service: Cardiovascular;  Laterality: N/A;   SILICON OIL REMOVAL Right 5/57/3220   Procedure: SILICON OIL REMOVAL;  Surgeon: Jalene Mullet, MD;  Location: [place];  Service: Ophthalmology;  Laterality: Right;    MEDICATIONS: No current facility-administered medications for this encounter.    albuterol (PROVENTIL HFA;VENTOLIN HFA) 108 (90 Base) MCG/ACT inhaler   aspirin 81 MG EC tablet   carvedilol (COREG) 12.5 MG tablet   cetirizine (ZYRTEC) 10 MG tablet   ferrous  sulfate 325 (65 FE) MG tablet   furosemide (LASIX) 40 MG tablet   gabapentin (NEURONTIN) 400 MG capsule   hydrALAZINE (APRESOLINE) 25 MG tablet   insulin aspart (NOVOLOG FLEXPEN) 100 UNIT/ML FlexPen   isosorbide mononitrate (IMDUR) 30 MG 24 hr tablet   LANTUS SOLOSTAR 100 UNIT/ML Solostar Pen   magnesium oxide (MAG-OX) 400 MG tablet   ondansetron (ZOFRAN) 4 MG tablet   potassium chloride SA (KLOR-CON) 20 MEQ tablet   rosuvastatin (CRESTOR) 40 MG tablet   Accu-Chek Softclix Lancets lancets   Blood Glucose Monitoring Suppl (ACCU-CHEK GUIDE) w/Device KIT   glucose blood (ACCU-CHEK GUIDE) test strip  Insulin Pen Needle (BD PEN NEEDLE NANO U/F) 32G X 4 MM MISC    Myra Gianotti, PA-C Surgical Short Stay/Anesthesiology [facility] Phone [phone] [facility] Phone [phone] 09/02/2021 1:12 PM

## 2021-09-07 ENCOUNTER — Ambulatory Visit (HOSPITAL_COMMUNITY): Admission: RE | Admit: 2021-09-07 | Payer: Medicare Other | Source: Home / Self Care | Admitting: Ophthalmology

## 2021-09-07 SURGERY — PARS PLANA VITRECTOMY WITH 25 GAUGE
Anesthesia: Monitor Anesthesia Care | Laterality: Left

## 2021-09-08 ENCOUNTER — Ambulatory Visit: Payer: Medicare Other | Admitting: Physician Assistant

## 2021-09-19 ENCOUNTER — Encounter (HOSPITAL_COMMUNITY): Payer: Self-pay | Admitting: Ophthalmology

## 2021-09-19 NOTE — Progress Notes (Signed)
Spoke with Wife Jeffrey [name] for [employer] and instructions for DOS.  DUE TO COVID-19 ONLY ONE VISITOR IS ALLOWED TO COME WITH YOU AND STAY IN THE WAITING ROOM ONLY DURING PRE OP AND PROCEDURE DAY OF SURGERY.   Internal Med - Dr Charlott Rakes Cardiologist - Dr Mertie Moores EP - Dr Virl Axe  Chest x-ray - 06/30/21 (1V) EKG - 08/17/21 Stress Test - n/a ECHO - 07/01/21 Cardiac Cath - 01/31/17  ICD - Medtronic Defibrillator.  Request sent to CV Heart Care Device.  Orders for programming under Chart Review encounters: Magnet ok.  This was for previously scheduled surgery on 09/07/21.  Per Jeffrey [name], pt's wife, surgery was cancelled due to low blood sugars. Secure E-mail sent to Medtronic Rep [place].  Last remote device check was on 07/02/21.   Sleep Study -  n/a CPAP - none  Fasting Blood Sugar - 71-150 Checks Blood Sugar 2-3 times a day.  Per wife, not having as many hypoglycemic events.  THE NIGHT BEFORE SURGERY, take 17 units of Lantus Insulin.        THE MORNING OF SURGERY, do not take Novolog Insulin unless your CBG is greater than 220 mg/dL.  If greater than 220, then you may take  of your sliding scale (correction) dose of insulin.  If your blood sugar is less than 70 mg/dL, you will need to treat for low blood sugar: Treat a low blood sugar (less than 70 mg/dL) with  cup of clear juice (cranberry or apple), 4 glucose tablets, OR glucose gel. Recheck blood sugar in 15 minutes after treatment (to make sure it is greater than 70 mg/dL). If your blood sugar is not greater than 70 mg/dL on recheck, call [phone] for further instructions.  Aspirin Instructions: Follow your surgeon's instructions on when to stop aspirin prior to surgery,  If no instructions were given by your surgeon then you will need to call the office for those instructions.  ERAS: Clear liquids til 12:45am on DOS  Anesthesia review: Yes, staff message sent  STOP now taking any Aspirin (unless otherwise  instructed by your surgeon), Aleve, Naproxen, Ibuprofen, Motrin, Advil, Goody's, BC's, all herbal medications, fish oil, and all vitamins.   Coronavirus Screening Covid test n/a Ambulatory Surgery  Does patient have any of the following symptoms:  Cough yes/no: No Fever (>100.38F)  yes/no: No Runny nose yes/no: No Sore throat yes/no: No Difficulty breathing/shortness of breath  yes/no: No  Has patient traveled in the last 14 days and where? yes/no: No  Wife Jeffrey [name] verbalized understanding of instructions that were given via phone.

## 2021-09-20 ENCOUNTER — Other Ambulatory Visit: Payer: Self-pay | Admitting: Family Medicine

## 2021-09-20 ENCOUNTER — Encounter: Payer: Self-pay | Admitting: Internal Medicine

## 2021-09-20 ENCOUNTER — Other Ambulatory Visit: Payer: Self-pay

## 2021-09-20 ENCOUNTER — Encounter (HOSPITAL_COMMUNITY): Payer: Self-pay | Admitting: Physician Assistant

## 2021-09-20 MED FILL — Gabapentin Cap 400 MG: ORAL | 30 days supply | Qty: 90 | Fill #1 | Status: CN

## 2021-09-20 MED FILL — Furosemide Tab 40 MG: ORAL | 30 days supply | Qty: 45 | Fill #2 | Status: CN

## 2021-09-20 NOTE — Progress Notes (Signed)
PERIOPERATIVE PRESCRIPTION FOR IMPLANTED CARDIAC DEVICE PROGRAMMING  Patient Information: Name:  Jeffrey [name]  DOB:  08-20-1966  MRN:  [id number]     Device Information:  Planned Procedure:  PARS PLANA VITRECTOMY WITH 25 GAUGE - Left  PHOTOCOAGULATION WITH LASER, PAN RETINAL - Left  Surgeon:  Dr. Jalene Mullet  Date of Procedure:  September 21, 2021  Cautery will be used.  Position during surgery:  Supine   Please send documentation back to:  [facility] (Fax # [phone])   Rickey Primus, RN  09/19/2021 10:30 AM  Clinic EP Physician:  Virl Axe, MD   Device Type:  Pacemaker and Defibrillator Medtronic [id number] Visia AF MRI VR Manufacturer and Phone #:  Medtronic: [phone] Pacemaker Dependent?:  No. Date of Last Device Check:  07/11/21 remote Normal Device Function?:  Yes.    Electrophysiologist's Recommendations:  Have magnet available. Provide continuous ECG monitoring when magnet is used or reprogramming is to be performed.  Procedure may interfere with device function.  Magnet should be placed over device during procedure.  Per Device Clinic Standing Orders, Willadean Carol, [place]  2:57 PM 09/20/2021

## 2021-09-21 ENCOUNTER — Ambulatory Visit (HOSPITAL_COMMUNITY): Admission: RE | Admit: 2021-09-21 | Payer: Medicare Other | Source: Home / Self Care | Admitting: Ophthalmology

## 2021-09-21 ENCOUNTER — Other Ambulatory Visit: Payer: Self-pay

## 2021-09-21 SURGERY — PARS PLANA VITRECTOMY WITH 25 GAUGE
Anesthesia: General | Laterality: Left

## 2021-09-21 MED ORDER — LIDOCAINE HCL 2 % IJ SOLN
INTRAMUSCULAR | Status: AC
  Filled 2021-09-21: qty 20

## 2021-09-21 MED ORDER — HYALURONIDASE HUMAN 150 UNIT/ML IJ SOLN
INTRAMUSCULAR | Status: AC
  Filled 2021-09-21: qty 1

## 2021-09-21 MED ORDER — DEXAMETHASONE SODIUM PHOSPHATE 10 MG/ML IJ SOLN
INTRAMUSCULAR | Status: AC
  Filled 2021-09-21: qty 1

## 2021-09-21 MED ORDER — TOBRAMYCIN-DEXAMETHASONE 0.3-0.1 % OP OINT
TOPICAL_OINTMENT | OPHTHALMIC | Status: AC
  Filled 2021-09-21: qty 3.5

## 2021-09-21 MED ORDER — BSS IO SOLN
INTRAOCULAR | Status: AC
  Filled 2021-09-21: qty 15

## 2021-09-21 MED ORDER — BUPIVACAINE HCL (PF) 0.75 % IJ SOLN
INTRAMUSCULAR | Status: AC
  Filled 2021-09-21: qty 10

## 2021-09-21 MED ORDER — INDOCYANINE GREEN 25 MG IV SOLR
INTRAVENOUS | Status: AC
  Filled 2021-09-21: qty 10

## 2021-09-21 MED ORDER — BSS PLUS IO SOLN
INTRAOCULAR | Status: AC
  Filled 2021-09-21: qty 500

## 2021-09-21 MED ORDER — EPINEPHRINE PF 1 MG/ML IJ SOLN
INTRAMUSCULAR | Status: AC
  Filled 2021-09-21: qty 1

## 2021-09-21 MED ORDER — ATROPINE SULFATE 1 % OP SOLN
OPHTHALMIC | Status: AC
  Filled 2021-09-21: qty 5

## 2021-09-24 NOTE — Progress Notes (Addendum)
Instructions given to [name] (wife).  [name] stated understanding of instructions for DOS.   Internal Med - Dr. Charlott Rakes Cardiologist - Dr. Mertie Moores EKG - 08/17/21 Chest x-ray - 06/30/21 (1 view) ECHO - 07/01/21 Cardiac Cath - 01/31/17 CPAP - n/a  ICD - Medtronic Defibrillator.  Request sent to CV Heart Care Device.  Orders for programming under Chart Review encounters: Magnet ok.  This was for previously scheduled surgery on 09/07/21, then rescheduled for 09/21/21, then rescheduled again to 09/24/21.  Per [name], pt's wife, surgery was cancelled due to low blood sugars. Secure E-mail sent to [employer]tr[employer] Rep [place].  Last remote device check was on 07/02/21.   *Per Theodoro Doing, RN - No changes to Device Perioperative Clearance (received 09/24/21) Orders are in chart.  Fasting Blood Sugar:  71-150 Checks Blood Sugar:  2-3x/day  ERAS Protcol - clears until 1315  COVID TEST- n/a (ambulatory surgery)  Anesthesia review: yes, heart history, ICD (Medtronic)  -------------  SDW INSTRUCTIONS:  Your procedure is scheduled on Tuesday, October 18th. Please report to [facility] Main Entrance "A" at 2:15 A.M., and check in at the Admitting office. Call this number if you have problems the morning of surgery: [phone]   Remember: Do not eat after midnight the night before your surgery  You may drink clear liquids until 1:15 PM the afternoon of your surgery.   Clear liquids allowed are: Water, Non-Citrus Juices (without pulp), Carbonated Beverages, Clear Tea, Black Coffee Only, and Gatorade   Medications to take morning of surgery with a sip of water include: Albuterol inhaler - if needed Carvedilol (Coreg) Zyrtec - if needed Gabapentin (Neurontin) Hydralazine (Apresoline) Isosorbide Mononitrate (Imdur) Ondansetron (Zofran) - if needed  As of today, STOP taking any Aspirin (unless otherwise instructed by your surgeon), Aleve, Naproxen, Ibuprofen, Motrin, Advil,  Goody's, BC's, all herbal medications, fish oil, and all vitamins.  WHAT DO I DO ABOUT MY DIABETES MEDICATION?   THE NIGHT BEFORE SURGERY, take 17 units of Lantus Insulin.                                                    THE MORNING OF SURGERY, do not take Novolog Insulin unless your CBG is greater than 220 mg/dL.  If greater than 220, then you may take  of your sliding scale (correction) dose of insulin.  If your blood sugar is less than 70 mg/dL, you will need to treat for low blood sugar: Treat a low blood sugar (less than 70 mg/dL) with  cup of clear juice (cranberry or apple), 4 glucose tablets, OR glucose gel. Recheck blood sugar in 15 minutes after treatment (to make sure it is greater than 70 mg/dL). If your blood sugar is not greater than 70 mg/dL on recheck, call [phone] for further instructions.   The Morning of Surgery Do not wear jewelry Do not wear lotions, powders, colognes, or deodorant Do not shave 48 hours prior to surgery.   Men may shave face and neck. Do not bring valuables to the hospital. [facility] is not responsible for any belongings or valuables.  If you are a smoker, DO NOT Smoke 24 hours prior to surgery  If you wear a CPAP at night please bring your mask the morning of surgery   Remember that you must have someone to transport you  home after your surgery, and remain with you for 24 hours if you are discharged the same day.  Please bring cases for contacts, glasses, hearing aids, dentures or bridgework because it cannot be worn into surgery.   Patients discharged the day of surgery will not be allowed to drive home.   Please shower the NIGHT BEFORE/MORNING OF SURGERY (use antibacterial soap like DIAL soap if possible). Wear comfortable clothes the morning of surgery. Oral Hygiene is also important to reduce your risk of infection.  Remember - BRUSH YOUR TEETH THE MORNING OF SURGERY WITH YOUR REGULAR TOOTHPASTE  Patient denies shortness of  breath, fever, cough and chest pain.

## 2021-09-27 ENCOUNTER — Encounter (HOSPITAL_COMMUNITY): Payer: Self-pay | Admitting: Vascular Surgery

## 2021-09-27 NOTE — Progress Notes (Addendum)
Anesthesia follow-up:    Case: [id number] Date/Time: 09/28/21 1600   Procedures:      PARS PLANA VITRECTOMY WITH 25 GAUGE (Left)     PHOTOCOAGULATION WITH LASER, PAN RETINAL (Left)   Anesthesia type: General   Pre-op diagnosis: MECHANICAL COMPLICATION OF OTHER OCCULAR PROSTHESTIC,RETINAL DETACHMENT   Location: [facility] OR ROOM 08 / [facility]   Surgeons: Jalene Mullet, MD       DISCUSSION: See my previous Anesthesia APP note for date of service 09/02/21. Surgery initially scheduled on 08/03/21 but canceled prior to arrival due to home hypoglycemic event prompting ED evaluation and then admission a few days later with additional insulin adjustments. (See my previous note or hospital discharge summary for more details.) Surgery then rescheduled for 09/07/21, but rescheduled again for 09/21/21 and now 09/28/21 for unclear reasons.    Perioperative cardiac device Rx: Clinic EP Physician:  Virl Axe, MD  Device Type:  Pacemaker and Defibrillator Medtronic [id number] Visia AF MRI VR Manufacturer and Phone #:  Medtronic: [phone] Pacemaker Dependent?:  No. Date of Last Device Check:  07/11/21 remote         Normal Device Function?:  Yes.     Electrophysiologist's Recommendations: Have magnet available. Provide continuous ECG monitoring when magnet is used or reprogramming is to be performed.  Procedure may interfere with device function.  Magnet should be placed over device during procedure.   Last labs seen are > 51 days old, so will need updating on arrival as indicated. Currently, most recent lab results include: Lab Results  Component Value Date   WBC 7.4 08/13/2021   HGB 11.8 (L) 08/13/2021   HCT 38.6 (L) 08/13/2021   PLT 291 08/13/2021   GLUCOSE 105 (H) 08/13/2021   ALT 24 08/06/2021   AST 25 08/06/2021   NA 139 08/13/2021   K 3.1 (L) 08/13/2021   CL 105 08/13/2021   CREATININE 1.71 (H) 08/13/2021   BUN 26 (H) 08/13/2021   CO2 24 08/13/2021   TSH 0.709 07/01/2021   HGBA1C 14.2 (H)  06/30/2021    Last A1c 14.2% 06/30/21, prompting increase in his Lantus and NovoLog, but then had recurrent significant hypoglycemic events as mentioned above, so Lantus decreased to 35 units daily (takes at bedtime) and 0-9 units NovoLog for SSI. PAT instructions included instruction on how he can management day of surgery CBGs > 220 or < 70. He will get a CBG along with other labs as indicated on arrival for surgery. Anesthesia team to evaluate on the day of surgery.   Myra Gianotti, PA-C Surgical Short Stay/Anesthesiology [facility] Phone [phone] [employer] [facility] Phone [phone] 09/27/2021 11:34 AM

## 2021-09-27 NOTE — Anesthesia Preprocedure Evaluation (Deleted)
Anesthesia Evaluation    Airway        Dental   Pulmonary           Cardiovascular hypertension,      Neuro/Psych    GI/Hepatic   Endo/Other  diabetes  Renal/GU      Musculoskeletal   Abdominal   Peds  Hematology   Anesthesia Other Findings   Reproductive/Obstetrics                             Anesthesia Physical Anesthesia Plan  ASA:   Anesthesia Plan:    Post-op Pain Management:    Induction:   PONV Risk Score and Plan:   Airway Management Planned:   Additional Equipment:   Intra-op Plan:   Post-operative Plan:   Informed Consent:   Plan Discussed with:   Anesthesia Plan Comments: (See PAT note written 09/27/2021 by Myra Gianotti, PA-C. )        Anesthesia Quick Evaluation

## 2021-09-28 ENCOUNTER — Encounter (HOSPITAL_COMMUNITY): Admission: RE | Payer: Self-pay | Source: Home / Self Care

## 2021-09-28 ENCOUNTER — Ambulatory Visit (HOSPITAL_COMMUNITY): Admission: RE | Admit: 2021-09-28 | Payer: Medicare Other | Source: Home / Self Care | Admitting: Ophthalmology

## 2021-09-28 ENCOUNTER — Other Ambulatory Visit: Payer: Self-pay

## 2021-09-28 SURGERY — PARS PLANA VITRECTOMY WITH 25 GAUGE
Anesthesia: General | Laterality: Left

## 2021-09-29 ENCOUNTER — Ambulatory Visit: Payer: Medicare Other | Admitting: Physician Assistant

## 2021-09-29 ENCOUNTER — Ambulatory Visit (INDEPENDENT_AMBULATORY_CARE_PROVIDER_SITE_OTHER): Payer: Medicaid Other

## 2021-09-29 DIAGNOSIS — I428 Other cardiomyopathies: Secondary | ICD-10-CM

## 2021-09-30 ENCOUNTER — Ambulatory Visit: Payer: Medicare Other | Admitting: Neurology

## 2021-09-30 ENCOUNTER — Encounter: Payer: Self-pay | Admitting: Neurology

## 2021-10-01 ENCOUNTER — Other Ambulatory Visit: Payer: Self-pay | Admitting: Pharmacist

## 2021-10-01 ENCOUNTER — Other Ambulatory Visit: Payer: Self-pay

## 2021-10-01 DIAGNOSIS — I5022 Chronic systolic (congestive) heart failure: Secondary | ICD-10-CM

## 2021-10-01 DIAGNOSIS — I110 Hypertensive heart disease with heart failure: Secondary | ICD-10-CM

## 2021-10-01 DIAGNOSIS — E1142 Type 2 diabetes mellitus with diabetic polyneuropathy: Secondary | ICD-10-CM

## 2021-10-01 LAB — CUP PACEART REMOTE DEVICE CHECK
Battery Remaining Longevity: 92 mo
Battery Voltage: 3 V
Brady Statistic RV Percent Paced: 0 %
Date Time Interrogation Session: 20221019162505
HighPow Impedance: 56 Ohm
Implantable Lead Implant Date: 20190204
Implantable Lead Location: 753860
Implantable Pulse Generator Implant Date: 20190204
Lead Channel Impedance Value: 323 Ohm
Lead Channel Impedance Value: 380 Ohm
Lead Channel Pacing Threshold Amplitude: 1.25 V
Lead Channel Pacing Threshold Pulse Width: 0.4 ms
Lead Channel Sensing Intrinsic Amplitude: 12.875 mV
Lead Channel Sensing Intrinsic Amplitude: 12.875 mV
Lead Channel Setting Pacing Amplitude: 2.5 V
Lead Channel Setting Pacing Pulse Width: 0.4 ms
Lead Channel Setting Sensing Sensitivity: 0.3 mV

## 2021-10-01 MED ORDER — GABAPENTIN 400 MG PO CAPS
ORAL_CAPSULE | ORAL | 0 refills | Status: DC
  Filled 2021-10-01: qty 21, 7d supply, fill #0

## 2021-10-01 MED ORDER — CARVEDILOL 12.5 MG PO TABS
ORAL_TABLET | Freq: Two times a day (BID) | ORAL | 0 refills | Status: DC
Start: 2021-10-01 — End: 2021-10-07
  Filled 2021-10-01: qty 14, 7d supply, fill #0

## 2021-10-01 MED ORDER — ISOSORBIDE MONONITRATE ER 30 MG PO TB24
30.0000 mg | ORAL_TABLET | Freq: Every day | ORAL | 0 refills | Status: DC
  Filled 2021-10-01: qty 7, 7d supply, fill #0

## 2021-10-04 NOTE — Progress Notes (Signed)
Remote ICD transmission.   

## 2021-10-07 ENCOUNTER — Other Ambulatory Visit: Payer: Self-pay | Admitting: Physician Assistant

## 2021-10-07 ENCOUNTER — Ambulatory Visit: Payer: Medicare Other | Attending: Physician Assistant | Admitting: Physician Assistant

## 2021-10-07 ENCOUNTER — Emergency Department (HOSPITAL_COMMUNITY)
Admission: EM | Admit: 2021-10-07 | Discharge: 2021-10-07 | Disposition: A | Discharge status: Home / Self Care | Payer: Medicare Other | Attending: Emergency Medicine | Admitting: Emergency Medicine

## 2021-10-07 ENCOUNTER — Encounter: Payer: Self-pay | Admitting: Physician Assistant

## 2021-10-07 ENCOUNTER — Other Ambulatory Visit: Payer: Self-pay

## 2021-10-07 VITALS — BP 225/123 | HR 86

## 2021-10-07 DIAGNOSIS — I10 Essential (primary) hypertension: Secondary | ICD-10-CM

## 2021-10-07 DIAGNOSIS — E162 Hypoglycemia, unspecified: Secondary | ICD-10-CM

## 2021-10-07 DIAGNOSIS — E11649 Type 2 diabetes mellitus with hypoglycemia without coma: Secondary | ICD-10-CM | POA: Insufficient documentation

## 2021-10-07 DIAGNOSIS — Z79899 Other long term (current) drug therapy: Secondary | ICD-10-CM | POA: Diagnosis not present

## 2021-10-07 DIAGNOSIS — N183 Chronic kidney disease, stage 3 unspecified: Secondary | ICD-10-CM | POA: Diagnosis not present

## 2021-10-07 DIAGNOSIS — E1165 Type 2 diabetes mellitus with hyperglycemia: Secondary | ICD-10-CM

## 2021-10-07 DIAGNOSIS — I5042 Chronic combined systolic (congestive) and diastolic (congestive) heart failure: Secondary | ICD-10-CM | POA: Insufficient documentation

## 2021-10-07 DIAGNOSIS — Z794 Long term (current) use of insulin: Secondary | ICD-10-CM | POA: Diagnosis not present

## 2021-10-07 DIAGNOSIS — E1140 Type 2 diabetes mellitus with diabetic neuropathy, unspecified: Secondary | ICD-10-CM | POA: Diagnosis not present

## 2021-10-07 DIAGNOSIS — I130 Hypertensive heart and chronic kidney disease with heart failure and stage 1 through stage 4 chronic kidney disease, or unspecified chronic kidney disease: Secondary | ICD-10-CM | POA: Insufficient documentation

## 2021-10-07 DIAGNOSIS — I2510 Atherosclerotic heart disease of native coronary artery without angina pectoris: Secondary | ICD-10-CM | POA: Diagnosis not present

## 2021-10-07 DIAGNOSIS — Z91199 Patient's noncompliance with other medical treatment and regimen due to unspecified reason: Secondary | ICD-10-CM | POA: Diagnosis not present

## 2021-10-07 DIAGNOSIS — E1142 Type 2 diabetes mellitus with diabetic polyneuropathy: Secondary | ICD-10-CM

## 2021-10-07 DIAGNOSIS — Z7982 Long term (current) use of aspirin: Secondary | ICD-10-CM | POA: Insufficient documentation

## 2021-10-07 DIAGNOSIS — E1169 Type 2 diabetes mellitus with other specified complication: Secondary | ICD-10-CM

## 2021-10-07 DIAGNOSIS — I110 Hypertensive heart disease with heart failure: Secondary | ICD-10-CM

## 2021-10-07 DIAGNOSIS — E1122 Type 2 diabetes mellitus with diabetic chronic kidney disease: Secondary | ICD-10-CM | POA: Insufficient documentation

## 2021-10-07 LAB — BASIC METABOLIC PANEL
Anion gap: 6 (ref 5–15)
BUN: 18 mg/dL (ref 6–20)
CO2: 26 mmol/L (ref 22–32)
Calcium: 9.1 mg/dL (ref 8.9–10.3)
Chloride: 106 mmol/L (ref 98–111)
Creatinine, Ser: 2.1 mg/dL — ABNORMAL HIGH (ref 0.61–1.24)
GFR, Estimated: 36 mL/min — ABNORMAL LOW (ref >60)
Glucose, Bld: 62 mg/dL — ABNORMAL LOW (ref 70–99)
Potassium: 3.6 mmol/L (ref 3.5–5.1)
Sodium: 138 mmol/L (ref 135–145)

## 2021-10-07 LAB — CBC
HCT: 37.8 % — ABNORMAL LOW (ref 39.0–52.0)
Hemoglobin: 11.6 g/dL — ABNORMAL LOW (ref 13.0–17.0)
MCH: 24.9 pg — ABNORMAL LOW (ref 26.0–34.0)
MCHC: 30.7 g/dL (ref 30.0–36.0)
MCV: 81.1 fL (ref 80.0–100.0)
Platelets: 268 10*3/uL (ref 150–400)
RBC: 4.66 MIL/uL (ref 4.22–5.81)
RDW: 15.2 % (ref 11.5–15.5)
WBC: 6.6 10*3/uL (ref 4.0–10.5)
nRBC: 0 % (ref 0.0–0.2)

## 2021-10-07 LAB — CBG MONITORING, ED
Glucose-Capillary: 73 mg/dL (ref 70–99)
Glucose-Capillary: 108 mg/dL — ABNORMAL HIGH (ref 70–99)

## 2021-10-07 LAB — GLUCOSE, POCT (MANUAL RESULT ENTRY): POC Glucose: 39 mg/dl — AB (ref 70–99)

## 2021-10-07 MED ORDER — POTASSIUM CHLORIDE ER 20 MEQ PO TBCR
EXTENDED_RELEASE_TABLET | ORAL | 0 refills | Status: DC
Start: 2021-10-07 — End: 2021-10-20
  Filled 2021-10-07: qty 30, 15d supply, fill #0

## 2021-10-07 MED ORDER — HYDRALAZINE HCL 25 MG PO TABS
25.0000 mg | ORAL_TABLET | Freq: Three times a day (TID) | ORAL | 0 refills | Status: DC
  Filled 2021-10-07: qty 40, 14d supply, fill #0

## 2021-10-07 MED ORDER — ISOSORBIDE MONONITRATE ER 30 MG PO TB24
30.0000 mg | ORAL_TABLET | Freq: Every day | ORAL | 0 refills | Status: DC
  Filled 2021-10-07: qty 21, 21d supply, fill #0

## 2021-10-07 MED ORDER — CARVEDILOL 12.5 MG PO TABS
12.5000 mg | ORAL_TABLET | Freq: Once | ORAL | Status: AC
  Administered 2021-10-07: 12.5 mg via ORAL
  Filled 2021-10-07: qty 1

## 2021-10-07 MED ORDER — NOVOLOG FLEXPEN 100 UNIT/ML ~~LOC~~ SOPN
0.0000 [IU] | PEN_INJECTOR | SUBCUTANEOUS | 0 refills | Status: DC
  Filled 2021-10-07: qty 3, fill #0
  Filled 2021-10-20: qty 3, 11d supply, fill #0

## 2021-10-07 MED ORDER — GABAPENTIN 400 MG PO CAPS: ORAL_CAPSULE | ORAL | 0 refills | Status: DC

## 2021-10-07 MED ORDER — ALBUTEROL SULFATE HFA 108 (90 BASE) MCG/ACT IN AERS: 1.0000 | INHALATION_SPRAY | Freq: Four times a day (QID) | RESPIRATORY_TRACT | 0 refills | Status: DC | PRN

## 2021-10-07 MED ORDER — LANTUS SOLOSTAR 100 UNIT/ML ~~LOC~~ SOPN
35.0000 [IU] | PEN_INJECTOR | Freq: Every day | SUBCUTANEOUS | 0 refills | Status: DC
  Filled 2021-10-07: qty 15, 42d supply, fill #0

## 2021-10-07 MED ORDER — GLUCOSE-VITAMIN C 4-6 GM-MG PO CHEW
1.0000 | CHEWABLE_TABLET | Freq: Once | ORAL | Status: AC
  Administered 2021-10-07: 1 via ORAL

## 2021-10-07 MED ORDER — CARVEDILOL 12.5 MG PO TABS
ORAL_TABLET | Freq: Two times a day (BID) | ORAL | 0 refills | Status: DC
  Filled 2021-10-07: qty 24, 12d supply, fill #0

## 2021-10-07 MED ORDER — POTASSIUM CHLORIDE CRYS ER 20 MEQ PO TBCR: 40.0000 meq | EXTENDED_RELEASE_TABLET | Freq: Every day | ORAL | 0 refills | Status: DC

## 2021-10-07 MED ORDER — BD PEN NEEDLE NANO U/F 32G X 4 MM MISC
0 refills | Status: DC
  Filled 2021-10-07: qty 100, 25d supply, fill #0

## 2021-10-07 MED ORDER — ALBUTEROL SULFATE HFA 108 (90 BASE) MCG/ACT IN AERS
INHALATION_SPRAY | RESPIRATORY_TRACT | 0 refills | Status: AC
  Filled 2021-10-07: qty 18, 25d supply, fill #0

## 2021-10-07 MED ORDER — ISOSORBIDE MONONITRATE ER 30 MG PO TB24
30.0000 mg | ORAL_TABLET | Freq: Once | ORAL | Status: AC
  Administered 2021-10-07: 30 mg via ORAL
  Filled 2021-10-07: qty 1

## 2021-10-07 MED ORDER — HYDRALAZINE HCL 25 MG PO TABS
25.0000 mg | ORAL_TABLET | Freq: Once | ORAL | Status: AC
  Administered 2021-10-07: 25 mg via ORAL
  Filled 2021-10-07: qty 1

## 2021-10-07 NOTE — Discharge Instructions (Signed)
Be consistent with taking her blood pressure medicines.  Follow-up with your doctors.

## 2021-10-07 NOTE — ED Triage Notes (Signed)
Pt via EMS from PCP office for routine diabetic check. Pt in bathroom, weak/unable to get off toilet, CBG 37. Improved with oral glucose and orange juice. Pt hypertensive with hx of same and non-compliant with meds. Reports taking 36 U of insulin this morning and did not eat breakfast. A/O x 4.

## 2021-10-07 NOTE — ED Provider Notes (Signed)
[facility] EMERGENCY DEPARTMENT Provider Note   CSN: [id number] Arrival date & time: 10/07/21  1231     History Chief Complaint  Patient presents with   Hypoglycemia    Jeffrey [name] is a 55 y.o. male.   Hypoglycemia Associated symptoms: no shortness of breath and no weakness   Patient presents with.  Hyperglycemia.  Was at [place] office this morning.  Became weak in the bathroom.  CBG of 37.  Had oral glucose and orange juice.  Sent in.  Also had hypertension.  History of same.  States he did not take his blood pressure medicine this morning.  States he did however take 36 units of insulin and did not eat any breakfast.  Feeling somewhat better now.  No chest pain.  No trouble breathing.  Not feeling confused.  No fevers.  States that sugar was 80 this morning when he checked it and 85 when he checked it last night.    Past Medical History:  Diagnosis Date   AICD (automatic cardioverter/defibrillator) present    Medtronic   AICD (automatic cardioverter/defibrillator) present    MDT Visia AF MRI   Anemia    CAD (coronary artery disease)    a. cath 01/31/17: 60% 1st RPLB, 60% dist RCA, 55% prox RCA, 10% pro LAD --> Rx TX.    CHF (congestive heart failure) (HCC)    Chronic systolic CHF (congestive heart failure) ([place]) 01/28/2017   1. Echo 01/29/17:  EF 20-25, normal wall motion, mild LAE // 2. EF 10-15 by [facility] 01/2017    Coronary artery disease    Diabetes mellitus    type II   Diabetes mellitus without complication ([place])    Diabetic foot infection ([place]) 03/2016   RT FOOT   Dyspnea    History of kidney stones    passed   History of kidney stones    HTN (hypertension)    Hyperlipidemia    Hypertension    Myocardial infarction [facility]), although reported, NICM at cath    NICM (nonischemic cardiomyopathy) ([place]) 02/15/2017   1. Mod non-obs CAD on LHC in 01/2017 - CAD does not explain cardiomyopathy   Renal disorder     Patient Active Problem List    Diagnosis Date Noted   Elevated troponin 07/01/2021   Seizure ([place]) 07/01/2021   Acute stroke due to ischemia [facility])    Hyperglycemia 06/30/2021   Prolonged QT interval 06/30/2021   Acute on chronic renal failure ([place]) 06/30/2021   Hypokalemia 06/30/2021   Hyperosmolar hyperglycemic state (HHS) ([place]) 41/66/0630   Chronic systolic CHF (congestive heart failure) ([place]) 06/30/2021   Essential hypertension 06/30/2021   Uncontrolled type 2 DM with hyperosmolar nonketotic hyperglycemia ([place]) 06/30/2021   Anemia due to chronic kidney disease 06/30/2021   CKD (chronic kidney disease), stage III ([place]) 06/30/2021   CAD (coronary artery disease) 06/30/2021   Fall due to seizure ([place]) 06/30/2021   Nephrotic syndrome    Hyperglycemia due to diabetes mellitus ([place]) 06/12/2020   Hypoglycemia 16/12/930   Acute metabolic encephalopathy 35/57/3220   S/P ICD (internal cardiac defibrillator) procedure 01/19/2018   Cardiac device in situ    Syncope and collapse 01/13/2018   NICM (nonischemic cardiomyopathy) ([place]) 02/15/2017   Coronary artery disease involving native coronary artery of native heart without angina pectoris 02/15/2017   Hyperlipidemia 02/15/2017   Chronic combined systolic (congestive) and diastolic (congestive) heart failure ([place]) 25/42/7062   Folliculitis 37/62/8315   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 (  [place]) 04/13/2016   Anemia, iron deficiency    Noncompliance with medication regimen 03/30/2016   Uncontrolled type 2 diabetes mellitus with hyperglycemia, with long-term current use of insulin ([place])    Cellulitis 09/25/2015   Essential hypertension 08/21/2013   Diabetic neuropathy ([place]) 08/21/2013   Fungal toenail infection 08/21/2013    Past Surgical History:  Procedure Laterality Date   AIR/FLUID EXCHANGE Right 07/14/2020   Procedure: AIR/FLUID EXCHANGE;  Surgeon: Jalene Mullet, MD;  Location: [facility];  Service: Ophthalmology;  Laterality: Right;   AMPUTATION  Right 04/01/2016   Procedure: Right Great Toe Amputation;  Surgeon: Newt Minion, MD;  Location: [facility];  Service: Orthopedics;  Laterality: Right;   AMPUTATION Right 06/19/2016   Procedure: AMPUTATION SECOND TOE;  Surgeon: Marybelle Killings, MD;  Location: [facility];  Service: Orthopedics;  Laterality: Right;   BACK SURGERY     for abscess   CARDIAC CATHETERIZATION     ICD IMPLANT N/A 01/15/2018   Procedure: ICD IMPLANT;  Surgeon: Deboraha Sprang, MD;  Location: [facility] CV LAB;  Service: Cardiovascular;  Laterality: N/A;   INJECTION OF SILICONE OIL Right 01/18/3784   Procedure: INJECTION OF SILICONE OIL;  Surgeon: Jalene Mullet, MD;  Location: [facility];  Service: Ophthalmology;  Laterality: Right;   INJECTION OF SILICONE OIL Right 8/85/0277   Procedure: INJECTION OF SILICONE OIL;  Surgeon: Jalene Mullet, MD;  Location: [place];  Service: Ophthalmology;  Laterality: Right;   INSERT / REPLACE / REMOVE PACEMAKER     MEMBRANE PEEL Right 08/25/2020   Procedure: MEMBRANE PEEL;  Surgeon: Jalene Mullet, MD;  Location: [facility];  Service: Ophthalmology;  Laterality: Right;   PARS PLANA VITRECTOMY Right 07/14/2020   Procedure: PARS PLANA VITRECTOMY WITH 25 GAUGE, Membranetomy, drainage of subretinal fluid;  Surgeon: Jalene Mullet, MD;  Location: [facility];  Service: Ophthalmology;  Laterality: Right;   PARS PLANA VITRECTOMY Right 08/25/2020   Procedure: PARS PLANA VITRECTOMY WITH 25 GAUGE;  Surgeon: Jalene Mullet, MD;  Location: [facility];  Service: Ophthalmology;  Laterality: Right;   PHOTOCOAGULATION WITH LASER Right 07/14/2020   Procedure: PHOTOCOAGULATION WITH LASER;  Surgeon: Jalene Mullet, MD;  Location: [facility];  Service: Ophthalmology;  Laterality: Right;   PHOTOCOAGULATION WITH LASER Right 08/25/2020   Procedure: PHOTOCOAGULATION WITH LASER;  Surgeon: Jalene Mullet, MD;  Location: [place];  Service: Ophthalmology;  Laterality: Right;   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   Procedure: REPAIR  OF HEMORRHAGIC DETACHMENT;  Surgeon: Jalene Mullet, MD;  Location: [place];  Service: Ophthalmology;  Laterality: Right;   RIGHT/LEFT HEART CATH AND CORONARY ANGIOGRAPHY N/A 01/31/2017   Procedure: Right/Left Heart Cath and Coronary Angiography;  Surgeon: Troy Sine, MD;  Location: [place] CV LAB;  Service: Cardiovascular;  Laterality: N/A;   SILICON OIL REMOVAL Right 03/23/8785   Procedure: SILICON OIL REMOVAL;  Surgeon: Jalene Mullet, MD;  Location: [facility];  Service: Ophthalmology;  Laterality: Right;       Family History  Problem Relation Age of Onset   Diabetes Mother    Hypertension Mother    Diabetes Father    Heart attack Father    Diabetes Sister    Heart attack Maternal Grandmother     Social History   Tobacco Use   Smoking status: Never   Smokeless tobacco: Never  Vaping Use   Vaping Use: Never used  Substance Use Topics   Alcohol use: Never   Drug use: Never    Home  Medications Prior to Admission medications   Medication Sig Start Date End Date Taking? Authorizing Provider  albuterol (VENTOLIN HFA) 108 (90 Base) MCG/ACT inhaler Inhale 1 to 2 puffs by mouth into the lungs every 6 hours as needed for wheezing or shortness of breath. 10/07/21  Yes Argentina Donovan, PA-C  aspirin 81 MG EC tablet Take 1 tablet (81 mg total) by mouth daily. Swallow whole. 07/03/21  Yes Mariel Aloe, MD  carvedilol (COREG) 12.5 MG tablet TAKE 1 TABLET (12.5 MG TOTAL) BY MOUTH 2 (TWO) TIMES DAILY WITH A MEAL. 10/07/21  Yes McClung, Angela M, PA-C  furosemide (LASIX) 40 MG tablet TAKE 1.5 TABLETS (60 MG TOTAL) BY MOUTH DAILY. Patient taking differently: Take 60 mg by mouth daily. 01/13/21 01/13/22 Yes Newlin, Charlane Ferretti, MD  gabapentin (NEURONTIN) 400 MG capsule TAKE 1 CAPSULE BY MOUTH 3 TAKE TIMES DAILY. Patient taking differently: Take 400 mg by mouth 3 (three) times daily. 10/07/21 10/07/22 Yes McClung, Dionne Bucy, PA-C  hydrALAZINE (APRESOLINE) 25 MG tablet Take 1 tablet (25 mg total)  by mouth 3 (three) times daily. 10/07/21 10/07/22 Yes McClung, Dionne Bucy, PA-C  insulin aspart (NOVOLOG FLEXPEN) 100 UNIT/ML FlexPen Inject 0-9 Units into the skin See admin instructions. Inject 0-9 units into the skin 3 times a day with meals, PER SLIDING SCALE:  CBG 70 - 120 =  0 unit CBG 121 - 150: 1 unit  CBG 151 - 200: 2 units  CBG 201 - 250: 3 units  CBG 251 - 300: 5 units  CBG 301 - 350: 7 units  CBG 351 - 400 9 units 10/07/21  Yes McClung, Angela M, PA-C  isosorbide mononitrate (IMDUR) 30 MG 24 hr tablet Take 1 tablet (30 mg total) by mouth daily. 10/07/21  Yes McClung, Angela M, PA-C  LANTUS SOLOSTAR 100 UNIT/ML Solostar Pen Inject 35 Units into the skin daily. 10/07/21  Yes Freeman Caldron M, PA-C  ondansetron (ZOFRAN) 4 MG tablet Take 1 tablet (4 mg total) by mouth every 6 (six) hours as needed for nausea or vomiting. 75/91/63  Yes Delora Fuel, MD  Potassium Chloride ER 20 MEQ TBCR Take 2 tablets by mouth daily. Patient taking differently: Take 40 mEq by mouth daily. 10/07/21  Yes McClung, Angela M, PA-C  rosuvastatin (CRESTOR) 40 MG tablet TAKE 1 TABLET (40 MG TOTAL) BY MOUTH DAILY. Patient taking differently: Take 40 mg by mouth at bedtime. 01/13/21 01/13/22 Yes Charlott Rakes, MD  Accu-Chek Softclix Lancets lancets Use to check blood sugar three times daily E11.65 08/12/21   Charlott Rakes, MD  albuterol (VENTOLIN HFA) 108 (90 Base) MCG/ACT inhaler Inhale 1-2 puffs into the lungs every 6 (six) hours as needed for wheezing or shortness of breath. Patient not taking: Reported on 10/07/2021 10/07/21   Argentina Donovan, PA-C  Blood Glucose Monitoring Suppl (ACCU-CHEK GUIDE) w/Device KIT Use to check blood sugar three times daily E11.65 08/12/21   Charlott Rakes, MD  cetirizine (ZYRTEC) 10 MG tablet TAKE 1 TABLET (10 MG TOTAL) BY MOUTH DAILY. Patient not taking: Reported on 10/07/2021 07/27/20   Charlott Rakes, MD  ferrous sulfate 325 (65 FE) MG tablet Take 1 tablet (325 mg total) by mouth  2 (two) times daily with a meal. 04/03/16   Dhungel, Nishant, MD  glucose blood (ACCU-CHEK GUIDE) test strip Use to check blood sugar three times daily E11.65 08/12/21   Charlott Rakes, MD  Insulin Pen Needle (BD PEN NEEDLE NANO U/F) 32G X 4 MM MISC USE TO INJECT  LANTUS DAILY. MUST USE NEW PEN NEEDLE WITH EACH INJECTION. 10/07/21   Argentina Donovan, PA-C  magnesium oxide (MAG-OX) 400 MG tablet Take 1 tablet (400 mg total) by mouth daily. Patient not taking: Reported on 10/07/2021 08/07/21   Shelly Coss, MD  potassium chloride SA (KLOR-CON) 20 MEQ tablet Take 2 tablets (40 mEq total) by mouth daily. Patient not taking: Reported on 10/07/2021 10/07/21   Argentina Donovan, PA-C    Allergies    Patient has no known allergies.  Review of Systems   Review of Systems  Constitutional:  Negative for appetite change.  Respiratory:  Negative for shortness of breath.   Cardiovascular:  Negative for chest pain.  Gastrointestinal:  Negative for abdominal pain.  Genitourinary:  Negative for flank pain.  Musculoskeletal:  Negative for back pain.  Skin:  Negative for rash.  Neurological:  Negative for weakness.  Psychiatric/Behavioral:  Positive for confusion.    Physical Exam Updated Vital Signs BP (!) 172/97 (BP Location: Left Arm)   Pulse 72   Temp 98.5 F (36.9 C)   Resp 16   SpO2 95%   Physical Exam Vitals and nursing note reviewed.  HENT:     Head: Atraumatic.  Eyes:     Pupils: Pupils are equal, round, and reactive to light.  Cardiovascular:     Rate and Rhythm: Regular rhythm.  Pulmonary:     Breath sounds: No wheezing.  Abdominal:     Tenderness: There is no abdominal tenderness.  Musculoskeletal:        General: No tenderness.     Cervical back: Neck supple.  Skin:    General: Skin is warm.     Capillary Refill: Capillary refill takes less than 2 seconds.  Neurological:     Mental Status: He is alert and oriented to person, place, and time.  Psychiatric:        Mood  and Affect: Mood normal.    ED Results / Procedures / Treatments   Labs (all labs ordered are listed, but only abnormal results are displayed) Labs Reviewed  BASIC METABOLIC PANEL - Abnormal; Notable for the following components:      Result Value   Glucose, Bld 62 (*)    Creatinine, Ser 2.10 (*)    GFR, Estimated 36 (*)    All other components within normal limits  CBC - Abnormal; Notable for the following components:   Hemoglobin 11.6 (*)    HCT 37.8 (*)    MCH 24.9 (*)    All other components within normal limits  CBG MONITORING, ED - Abnormal; Notable for the following components:   Glucose-Capillary 108 (*)    All other components within normal limits  CBG MONITORING, ED    EKG None  Radiology No results found.  Procedures Procedures   Medications Ordered in ED Medications  carvedilol (COREG) tablet 12.5 mg (12.5 mg Oral Given 10/07/21 1303)  hydrALAZINE (APRESOLINE) tablet 25 mg (25 mg Oral Given 10/07/21 1304)  isosorbide mononitrate (IMDUR) 24 hr tablet 30 mg (30 mg Oral Given 10/07/21 1304)    ED Course  I have reviewed the triage vital signs and the nursing notes.  Pertinent labs & imaging results that were available during my care of the patient were reviewed by me and considered in my medical decision making (see chart for details).    MDM Rules/Calculators/A&P  Patient with hyperglycemia.  Took insulin this morning without eating.  Sugar had dropped.  Also had hypertension but not taking his other medicines.  Sugar improved with oral intake.  Can follow at home.  No infection.  Sugar likely low from not eating and still taking the insulin. Final Clinical Impression(s) / ED Diagnoses Final diagnoses:  Hypoglycemia  Hypertension, unspecified type    Rx / DC Orders ED Discharge Orders     None        Davonna Belling, MD 10/08/21 2351

## 2021-10-07 NOTE — Progress Notes (Signed)
Patient ID: Jeffrey [name], male   DOB: 1966/04/25, 55 y.o.   MRN: [id number]   Dylan Monforte, is a 55 y.o. male  GYI:[id number]  JJK:[id number]  DOB - 02/06/1966  No chief complaint on file.      Subjective:   Jeffrey [name] is a 55 y.o. male here today for a diabetes check and was feeling lethargic.  He was brought in by his mom and she does not stay with him and does not know anything about when/how he last took medications.  He asked to go to the bathroom and was unable to get off the toilet.  His mom came and got staff.  He was lethargic and only partially responding to questions.  He thinks he took too much insulin but could not tell me exactly what he took.  He can't tell me if he took any other meds.  Denies CP.  Admits to some SOB.    We checked his blood sugar (39)and VS and alerted 911 bc he still could not get off the toilet.  He was given glucose tablet and drank a small amount of grape juice.  Ems arrived before 2nd glucose could be checked.    His mom says she is aware he has been having falls frequently  No problems updated.  ALLERGIES: No Known Allergies  PAST MEDICAL HISTORY: Past Medical History:  Diagnosis Date   AICD (automatic cardioverter/defibrillator) present    Medtronic   AICD (automatic cardioverter/defibrillator) present    MDT Visia AF MRI   Anemia    CAD (coronary artery disease)    a. cath 01/31/17: 60% 1st RPLB, 60% dist RCA, 55% prox RCA, 10% pro LAD --> Rx TX.    CHF (congestive heart failure) (HCC)    Chronic systolic CHF (congestive heart failure) ([place]) 01/28/2017   1. Echo 01/29/17:  EF 20-25, normal wall motion, mild LAE // 2. EF 10-15 by [facility] 01/2017    Coronary artery disease    Diabetes mellitus    type II   Diabetes mellitus without complication ([place])    Diabetic foot infection ([place]) 03/2016   RT FOOT   Dyspnea    History of kidney stones    passed   History of kidney stones    HTN (hypertension)    Hyperlipidemia     Hypertension    Myocardial infarction [facility]), although reported, NICM at cath    NICM (nonischemic cardiomyopathy) ([place]) 02/15/2017   1. Mod non-obs CAD on LHC in 01/2017 - CAD does not explain cardiomyopathy   Renal disorder     MEDICATIONS AT HOME: Prior to Admission medications   Medication Sig Start Date End Date Taking? Authorizing Provider  Accu-Chek Softclix Lancets lancets Use to check blood sugar three times daily E11.65 08/12/21   Charlott Rakes, MD  albuterol (VENTOLIN HFA) 108 (90 Base) MCG/ACT inhaler Inhale 1-2 puffs into the lungs every 6 (six) hours as needed for wheezing or shortness of breath. 10/07/21   Argentina Donovan, PA-C  albuterol (VENTOLIN HFA) 108 (90 Base) MCG/ACT inhaler Inhale 1 to 2 puffs by mouth into the lungs every 6 hours as needed for wheezing or shortness of breath. 10/07/21   Argentina Donovan, PA-C  aspirin 81 MG EC tablet Take 1 tablet (81 mg total) by mouth daily. Swallow whole. 07/03/21   Mariel Aloe, MD  Blood Glucose Monitoring Suppl (ACCU-CHEK GUIDE) w/Device KIT Use to check blood sugar three times daily E11.65 08/12/21   Charlott Rakes, MD  carvedilol (COREG) 12.5 MG tablet TAKE 1 TABLET (12.5 MG TOTAL) BY MOUTH 2 (TWO) TIMES DAILY WITH A MEAL. 10/07/21   Argentina Donovan, PA-C  cetirizine (ZYRTEC) 10 MG tablet TAKE 1 TABLET (10 MG TOTAL) BY MOUTH DAILY. 07/27/20   Charlott Rakes, MD  ferrous sulfate 325 (65 FE) MG tablet Take 1 tablet (325 mg total) by mouth 2 (two) times daily with a meal. 04/03/16   Dhungel, Nishant, MD  furosemide (LASIX) 40 MG tablet TAKE 1.5 TABLETS (60 MG TOTAL) BY MOUTH DAILY. Patient taking differently: Take 60 mg by mouth in the morning. 01/13/21 01/13/22  Charlott Rakes, MD  gabapentin (NEURONTIN) 400 MG capsule TAKE 1 CAPSULE BY MOUTH 3 TAKE TIMES DAILY. 10/07/21 10/07/22  Argentina Donovan, PA-C  glucose blood (ACCU-CHEK GUIDE) test strip Use to check blood sugar three times daily E11.65 08/12/21   Charlott Rakes, MD   hydrALAZINE (APRESOLINE) 25 MG tablet Take 1 tablet (25 mg total) by mouth 3 (three) times daily. 10/07/21 10/07/22  Argentina Donovan, PA-C  insulin aspart (NOVOLOG FLEXPEN) 100 UNIT/ML FlexPen Inject 0-9 Units into the skin See admin instructions. Inject 0-9 units into the skin 3 times a day with meals, PER SLIDING SCALE:  CBG 70 - 120 =  0 unit CBG 121 - 150: 1 unit  CBG 151 - 200: 2 units  CBG 201 - 250: 3 units  CBG 251 - 300: 5 units  CBG 301 - 350: 7 units  CBG 351 - 400 9 units 10/07/21   Peyten Weare M, PA-C  Insulin Pen Needle (BD PEN NEEDLE NANO U/F) 32G X 4 MM MISC USE TO INJECT LANTUS DAILY. MUST USE NEW PEN NEEDLE WITH EACH INJECTION. 10/07/21   Argentina Donovan, PA-C  isosorbide mononitrate (IMDUR) 30 MG 24 hr tablet Take 1 tablet (30 mg total) by mouth daily. 10/07/21   Argentina Donovan, PA-C  LANTUS SOLOSTAR 100 UNIT/ML Solostar Pen Inject 35 Units into the skin daily. 10/07/21   Argentina Donovan, PA-C  magnesium oxide (MAG-OX) 400 MG tablet Take 1 tablet (400 mg total) by mouth daily. 08/07/21   Shelly Coss, MD  ondansetron (ZOFRAN) 4 MG tablet Take 1 tablet (4 mg total) by mouth every 6 (six) hours as needed for nausea or vomiting. 21/22/48   Delora Fuel, MD  Potassium Chloride ER 20 MEQ TBCR Take 2 tablets by mouth daily. 10/07/21   Argentina Donovan, PA-C  potassium chloride SA (KLOR-CON) 20 MEQ tablet Take 2 tablets (40 mEq total) by mouth daily. 10/07/21   Argentina Donovan, PA-C  rosuvastatin (CRESTOR) 40 MG tablet TAKE 1 TABLET (40 MG TOTAL) BY MOUTH DAILY. Patient taking differently: Take 40 mg by mouth at bedtime. 01/13/21 01/13/22  Charlott Rakes, MD    Objective:   Vitals:   10/07/21 1204  BP: (!) 225/123  Pulse: 86  SpO2: 96%   Exam General appearance : sluggish, conscious and semi-alert, not in any distress. Speech is mumbled and incomplete. Not toxic looking HEENT: Atraumatic and Normocephalic, Chest: fair air entry bilaterally, CTAB.   CVS: S1  S2 regular, no murmurs.  Skin: No Rash  Data Review Lab Results  Component Value Date   HGBA1C 14.2 (H) 06/30/2021   HGBA1C 10.7 (A) 01/13/2021   HGBA1C 15.4 (H) 06/13/2020    Assessment & Plan   1. Hypoglycemia Glucose 39; grape juice and glucose tablet given - glucose-Vitamin C 4-0.006 GM per chewable tablet 1 tablet  2. HTN (hypertension),  malignant To ED via EMS  3. Uncontrolled type 2 diabetes mellitus with hyperglycemia, with long-term current use of insulin (HCC) uncontrolled - POCT glucose (manual entry)  4. Poor compliance     Patient have been counseled extensively about nutrition and exercise. Other issues discussed during this visit include: low cholesterol diet, weight control and daily exercise, foot care, annual eye examinations at Ophthalmology, importance of adherence with medications and regular follow-up. We also discussed long term complications of uncontrolled diabetes and hypertension.   Return in about 3 weeks (around 10/28/2021) for with Dr Margarita Rana.  The patient was given clear instructions to go to ER or return to medical center if symptoms don't improve, worsen or new problems develop. The patient verbalized understanding. The patient was told to call to get lab results if they haven't heard anything in the next week.      Freeman Caldron, PA-C [facility] and [place], [place]   10/07/2021, 12:38 PM

## 2021-10-11 ENCOUNTER — Other Ambulatory Visit (HOSPITAL_COMMUNITY): Payer: Self-pay

## 2021-10-11 ENCOUNTER — Other Ambulatory Visit: Payer: Self-pay

## 2021-10-11 ENCOUNTER — Encounter: Payer: Self-pay | Admitting: Surgery

## 2021-10-11 ENCOUNTER — Ambulatory Visit (INDEPENDENT_AMBULATORY_CARE_PROVIDER_SITE_OTHER): Payer: Medicare Other | Admitting: Surgery

## 2021-10-11 DIAGNOSIS — I5031 Acute diastolic (congestive) heart failure: Secondary | ICD-10-CM

## 2021-10-11 MED FILL — Furosemide Tab 40 MG: ORAL | 30 days supply | Qty: 45 | Fill #2 | Status: CN

## 2021-10-11 NOTE — Progress Notes (Signed)
[facility] [place]  Patient name: Jeffrey [name] MRN: [id number] DOB: [birth year] Sex: male      Virtual Visit via Telephone Note   This visit type was conducted due to national recommendations for restrictions regarding the COVID-19 Pandemic (e.g. social distancing) in an effort to limit this patient's exposure and mitigate transmission in our community.  Due to his co-morbid illnesses, this patient is at least at moderate risk for complications without adequate follow up.  This format is felt to be most appropriate for this patient at this time.  The patient did not have access to video technology/had technical difficulties with video requiring transitioning to audio format only (telephone).  All issues noted in this document were discussed and addressed.  No physical exam could be performed with this format.    Patient Location: Home Provider Location: Office/Clinic  REQUESTING PROVIDER:    Dr. Rayann Heman    REASON FOR APPOINTMENT:    Barostim evaluation  HISTORY OF PRESENT ILLNESS:   Jeffrey [name] is a 55 y.o. male, who is being referred for Barostim device implant.  He was initially enrolled in the [place] study, however I felt his bifurcation was too high for the percutaneous approach.  I did feel that he was a good candidate for a commercial device.  The patient has a single-chamber ICD in place since 2019.  He has a history of coronary artery disease, status post cath in 2018 for which medical management was recommended.  He is a type II diabetic.  He is medically managed for hypertension.  He is on a statin for hypercholesterolemia.    PAST MEDICAL HISTORY    Past Medical History:  Diagnosis Date   AICD (automatic cardioverter/defibrillator) present    Medtronic   AICD (automatic cardioverter/defibrillator) present    MDT Visia AF MRI   Anemia    CAD (coronary artery disease)    a. cath 01/31/17: 60% 1st RPLB, 60% dist RCA, 55%  prox RCA, 10% pro LAD --> Rx TX.    CHF (congestive heart failure) (HCC)    Chronic systolic CHF (congestive heart failure) ([place]) 01/28/2017   1. Echo 01/29/17:  EF 20-25, normal wall motion, mild LAE // 2. EF 10-15 by [facility] 01/2017    Coronary artery disease    Diabetes mellitus    type II   Diabetes mellitus without complication ([place])    Diabetic foot infection ([place]) 03/2016   RT FOOT   Dyspnea    History of kidney stones    passed   History of kidney stones    HTN (hypertension)    Hyperlipidemia    Hypertension    Myocardial infarction [facility]), although reported, NICM at cath    NICM (nonischemic cardiomyopathy) ([place]) 02/15/2017   1. Mod non-obs CAD on LHC in 01/2017 - CAD does not explain cardiomyopathy   Renal disorder      FAMILY HISTORY   Family History  Problem Relation Age of Onset   Diabetes Mother    Hypertension Mother    Diabetes Father    Heart attack Father    Diabetes Sister    Heart attack Maternal Grandmother     SOCIAL HISTORY:   Social History   Socioeconomic History   Marital status: Unknown    Spouse name: Not on file   Number of children: Not on file   Years of education: Not on file  Highest education level: Not on file  Occupational History   Not on file  Tobacco Use   Smoking status: Never   Smokeless tobacco: Never  Vaping Use   Vaping Use: Never used  Substance and Sexual Activity   Alcohol use: Never   Drug use: Never   Sexual activity: Not on file  Other Topics Concern   Not on file  Social History Narrative   ** Merged History Encounter **       Social Determinants of Health   Financial Resource Strain: Not on file  Food Insecurity: Not on file  Transportation Needs: Not on file  Physical Activity: Not on file  Stress: Not on file  Social Connections: Not on file  Intimate Partner Violence: Not on file    ALLERGIES:    No Known Allergies  CURRENT MEDICATIONS:    Current Outpatient Medications  Medication Sig  Dispense Refill   Accu-Chek Softclix Lancets lancets Use to check blood sugar three times daily E11.65 100 each 5   albuterol (VENTOLIN HFA) 108 (90 Base) MCG/ACT inhaler Inhale 1 to 2 puffs by mouth into the lungs every 6 hours as needed for wheezing or shortness of breath. 18 g 0   aspirin 81 MG EC tablet Take 1 tablet (81 mg total) by mouth daily. Swallow whole. 30 tablet 2   Blood Glucose Monitoring Suppl (ACCU-CHEK GUIDE) w/Device KIT Use to check blood sugar three times daily E11.65 1 kit 0   carvedilol (COREG) 12.5 MG tablet TAKE 1 TABLET (12.5 MG TOTAL) BY MOUTH 2 (TWO) TIMES DAILY WITH A MEAL. 24 tablet 0   ferrous sulfate 325 (65 FE) MG tablet Take 1 tablet (325 mg total) by mouth 2 (two) times daily with a meal. 60 tablet 3   furosemide (LASIX) 40 MG tablet TAKE 1.5 TABLETS (60 MG TOTAL) BY MOUTH DAILY. (Patient taking differently: Take 60 mg by mouth daily.) 45 tablet 6   gabapentin (NEURONTIN) 400 MG capsule TAKE 1 CAPSULE BY MOUTH 3 TAKE TIMES DAILY. (Patient taking differently: Take 400 mg by mouth 3 (three) times daily.) 21 capsule 0   glucose blood (ACCU-CHEK GUIDE) test strip Use to check blood sugar three times daily E11.65 100 each 12   hydrALAZINE (APRESOLINE) 25 MG tablet Take 1 tablet (25 mg total) by mouth 3 (three) times daily. 40 tablet 0   insulin aspart (NOVOLOG FLEXPEN) 100 UNIT/ML FlexPen Inject 0-9 Units into the skin See admin instructions. Inject 0-9 units into the skin 3 times a day with meals, PER SLIDING SCALE:  CBG 70 - 120 =  0 unit CBG 121 - 150: 1 unit  CBG 151 - 200: 2 units  CBG 201 - 250: 3 units  CBG 251 - 300: 5 units  CBG 301 - 350: 7 units  CBG 351 - 400 9 units 3 mL 0   Insulin Pen Needle (BD PEN NEEDLE NANO U/F) 32G X 4 MM MISC USE TO INJECT LANTUS DAILY. MUST USE NEW PEN NEEDLE WITH EACH INJECTION. 100 each 0   isosorbide mononitrate (IMDUR) 30 MG 24 hr tablet Take 1 tablet (30 mg total) by mouth daily. 21 tablet 0   LANTUS SOLOSTAR 100 UNIT/ML  Solostar Pen Inject 35 Units into the skin daily. 15 mL 0   magnesium oxide (MAG-OX) 400 MG tablet Take 1 tablet (400 mg total) by mouth daily. 15 tablet 0   ondansetron (ZOFRAN) 4 MG tablet Take 1 tablet (4 mg total) by mouth every 6 (  six) hours as needed for nausea or vomiting. 12 tablet 0   Potassium Chloride ER 20 MEQ TBCR Take 2 tablets by mouth daily. (Patient taking differently: Take 40 mEq by mouth daily.) 30 tablet 0   potassium chloride SA (KLOR-CON) 20 MEQ tablet Take 2 tablets (40 mEq total) by mouth daily. 30 tablet 0   rosuvastatin (CRESTOR) 40 MG tablet TAKE 1 TABLET (40 MG TOTAL) BY MOUTH DAILY. (Patient taking differently: Take 40 mg by mouth at bedtime.) 90 tablet 1   albuterol (VENTOLIN HFA) 108 (90 Base) MCG/ACT inhaler Inhale 1-2 puffs into the lungs every 6 (six) hours as needed for wheezing or shortness of breath. (Patient not taking: No sig reported) 1 each 0   cetirizine (ZYRTEC) 10 MG tablet TAKE 1 TABLET (10 MG TOTAL) BY MOUTH DAILY. (Patient not taking: No sig reported) 30 tablet 0   No current facility-administered medications for this visit.    REVIEW OF SYSTEMS:   Please see the history of present illness.     All other systems reviewed and are negative.  PHYSICAL EXAM:      Recent Labs: 03/30/2021: NT-Pro BNP 290 06/30/2021: B Natriuretic Peptide 30.6 07/01/2021: TSH 0.709 08/06/2021: ALT 24; Magnesium 1.4 10/07/2021: BUN 18; Creatinine, Ser 2.10; Hemoglobin 11.6; Platelets 268; Potassium 3.6; Sodium 138   Recent Lipid Panel Lab Results  Component Value Date/Time   CHOL 201 (H) 07/02/2021 12:37 AM   CHOL 137 04/25/2019 09:25 AM   TRIG 225 (H) 07/02/2021 12:37 AM   HDL 36 (L) 07/02/2021 12:37 AM   HDL 36 (L) 04/25/2019 09:25 AM   CHOLHDL 5.6 07/02/2021 12:37 AM   LDLCALC 120 (H) 07/02/2021 12:37 AM   LDLCALC 57 04/25/2019 09:25 AM    Wt Readings from Last 3 Encounters:  08/13/21 219 lb 12.8 oz (99.7 kg)  08/07/21 219 lb 11.2 oz (99.7 kg)   08/03/21 209 lb 7 oz (95 kg)     STUDIES:   I have reviewed his echocardiogram which shows ejection fraction of 30-35%  I have reviewed his carotid duplex which shows 1-39% stenosis ASSESSMENT and PLAN   NYHA class III chronic systolic dysfunction: The patient has failed to improve despite optimization of guideline directed medical therapy.  He is an appropriate candidate for a Barostim device implant which would be on the right side.  I discussed the details of the operation with the patient.  All of his questions were answered.  We will move forward    Time:   Today, I have spent 14 minutes with the patient with telehealth technology discussing the above problems.        Leia Alf, MD, FACS [facility] [phone] Pager [phone]

## 2021-10-18 ENCOUNTER — Other Ambulatory Visit: Payer: Self-pay

## 2021-10-20 ENCOUNTER — Other Ambulatory Visit: Payer: Self-pay

## 2021-10-20 ENCOUNTER — Ambulatory Visit: Payer: Medicare Other | Attending: Family Medicine | Admitting: Family Medicine

## 2021-10-20 ENCOUNTER — Encounter: Payer: Self-pay | Admitting: Family Medicine

## 2021-10-20 VITALS — BP 130/89 | HR 81 | Ht 67.0 in | Wt 218.4 lb

## 2021-10-20 DIAGNOSIS — Z23 Encounter for immunization: Secondary | ICD-10-CM

## 2021-10-20 DIAGNOSIS — E1165 Type 2 diabetes mellitus with hyperglycemia: Secondary | ICD-10-CM

## 2021-10-20 DIAGNOSIS — E1169 Type 2 diabetes mellitus with other specified complication: Secondary | ICD-10-CM | POA: Diagnosis not present

## 2021-10-20 DIAGNOSIS — E1142 Type 2 diabetes mellitus with diabetic polyneuropathy: Secondary | ICD-10-CM

## 2021-10-20 DIAGNOSIS — I129 Hypertensive chronic kidney disease with stage 1 through stage 4 chronic kidney disease, or unspecified chronic kidney disease: Secondary | ICD-10-CM

## 2021-10-20 DIAGNOSIS — I110 Hypertensive heart disease with heart failure: Secondary | ICD-10-CM

## 2021-10-20 DIAGNOSIS — N049 Nephrotic syndrome with unspecified morphologic changes: Secondary | ICD-10-CM

## 2021-10-20 DIAGNOSIS — R269 Unspecified abnormalities of gait and mobility: Secondary | ICD-10-CM

## 2021-10-20 DIAGNOSIS — E785 Hyperlipidemia, unspecified: Secondary | ICD-10-CM

## 2021-10-20 DIAGNOSIS — I5022 Chronic systolic (congestive) heart failure: Secondary | ICD-10-CM

## 2021-10-20 DIAGNOSIS — Z794 Long term (current) use of insulin: Secondary | ICD-10-CM

## 2021-10-20 DIAGNOSIS — E1121 Type 2 diabetes mellitus with diabetic nephropathy: Secondary | ICD-10-CM

## 2021-10-20 DIAGNOSIS — E1122 Type 2 diabetes mellitus with diabetic chronic kidney disease: Secondary | ICD-10-CM | POA: Diagnosis not present

## 2021-10-20 DIAGNOSIS — R400 Somnolence: Secondary | ICD-10-CM

## 2021-10-20 DIAGNOSIS — N1832 Chronic kidney disease, stage 3b: Secondary | ICD-10-CM

## 2021-10-20 LAB — POCT GLYCOSYLATED HEMOGLOBIN (HGB A1C): HbA1c, POC (controlled diabetic range): 7.6 % — AB (ref 0.0–7.0)

## 2021-10-20 LAB — GLUCOSE, POCT (MANUAL RESULT ENTRY): POC Glucose: 170 mg/dl — AB (ref 70–99)

## 2021-10-20 MED ORDER — CARVEDILOL 12.5 MG PO TABS
ORAL_TABLET | Freq: Two times a day (BID) | ORAL | 6 refills | Status: DC
  Filled 2021-10-20 – 2021-12-23 (×3): qty 60, 30d supply, fill #0
  Filled 2022-02-24: qty 60, 30d supply, fill #1
  Filled 2022-04-19: qty 60, 30d supply, fill #2
  Filled 2022-05-30 – 2022-06-06 (×2): qty 60, 30d supply, fill #3

## 2021-10-20 MED ORDER — ROSUVASTATIN CALCIUM 40 MG PO TABS
ORAL_TABLET | Freq: Every day | ORAL | 1 refills | Status: DC
  Filled 2021-10-20: qty 90, 90d supply, fill #0

## 2021-10-20 MED ORDER — GABAPENTIN 300 MG PO CAPS
600.0000 mg | ORAL_CAPSULE | Freq: Every day | ORAL | 6 refills | Status: DC
  Filled 2021-10-20 – 2021-11-08 (×2): qty 60, 30d supply, fill #0
  Filled 2021-12-08: qty 60, 30d supply, fill #1
  Filled 2021-12-23: qty 60, 30d supply, fill #0
  Filled 2022-02-24: qty 60, 30d supply, fill #1

## 2021-10-20 MED ORDER — POTASSIUM CHLORIDE CRYS ER 20 MEQ PO TBCR
40.0000 meq | EXTENDED_RELEASE_TABLET | Freq: Every day | ORAL | 6 refills | Status: DC
Start: 2021-10-20 — End: 2022-03-09
  Filled 2021-10-20 – 2021-11-08 (×2): qty 30, 15d supply, fill #0
  Filled 2021-12-08: qty 30, 15d supply, fill #1
  Filled 2021-12-23: qty 60, 30d supply, fill #0
  Filled 2022-02-28: qty 60, 30d supply, fill #1

## 2021-10-20 MED ORDER — FUROSEMIDE 40 MG PO TABS
40.0000 mg | ORAL_TABLET | Freq: Two times a day (BID) | ORAL | 6 refills | Status: DC
  Filled 2021-10-20 – 2021-11-08 (×2): qty 60, 30d supply, fill #0
  Filled 2021-12-08: qty 60, 30d supply, fill #1
  Filled 2021-12-23 – 2021-12-24 (×4): qty 60, 30d supply, fill #0
  Filled 2022-02-24: qty 60, 30d supply, fill #1

## 2021-10-20 MED ORDER — ISOSORBIDE MONONITRATE ER 30 MG PO TB24
30.0000 mg | ORAL_TABLET | Freq: Every day | ORAL | 6 refills | Status: DC
  Filled 2021-10-20 – 2021-12-23 (×3): qty 30, 30d supply, fill #0
  Filled 2022-02-24: qty 30, 30d supply, fill #1
  Filled 2022-04-19: qty 30, 30d supply, fill #2
  Filled 2022-05-30 – 2022-06-06 (×2): qty 30, 30d supply, fill #3

## 2021-10-20 MED ORDER — HYDRALAZINE HCL 25 MG PO TABS
25.0000 mg | ORAL_TABLET | Freq: Three times a day (TID) | ORAL | 6 refills | Status: DC
  Filled 2021-10-20: qty 90, 30d supply, fill #0

## 2021-10-20 MED ORDER — LANTUS SOLOSTAR 100 UNIT/ML ~~LOC~~ SOPN
35.0000 [IU] | PEN_INJECTOR | Freq: Every day | SUBCUTANEOUS | 6 refills | Status: DC
Start: 2021-10-20 — End: 2022-03-17
  Filled 2021-10-20 – 2022-02-24 (×3): qty 30, 85d supply, fill #0

## 2021-10-20 NOTE — Patient Instructions (Signed)
Nephrotic Syndrome Nephrotic syndrome is a condition that results from damage to the glomeruli in the kidneys. Glomeruli are filters that remove toxins and waste products from the bloodstream. When they are damaged, glomeruli may also remove needed substances, such as proteins. In nephrotic syndrome, the body loses too much of the proteins and other needed substances. If you have nephrotic syndrome, you may have: Proteinuria, which is high levels of protein in your urine. High blood pressure. Hypoalbuminemia, which is a low level of the protein albumin in your blood. High cholesterol. High blood levels of fatty substances known as triglycerides. Edema, or swelling, of your face, abdomen, arms, and legs. Nephrotic syndrome may increase your risk of further kidney damage and of health problems such as blood clots and infections. What are the causes? This condition may be caused by other kidney diseases that damage the glomeruli. These may include: Minimal change disease. Focal segmental glomerulosclerosis. Membranous nephropathy. Glomerulonephritis. In some cases, the cause may not be known. What increases the risk? The following factors may make you more likely to develop this condition: Having certain medical conditions. These include: Diabetes. A disease, such as lupus, that causes your body's defense system (immune system) to attack the body itself. Amyloidosis. Some types of cancer, including a cancer of plasma cells (multiple myeloma). An infection, such as hepatitis C. Taking certain medicines, such as ibuprofen and other NSAIDs, and some anti-cancer drugs. What are the signs or symptoms? Symptoms of this condition include: Edema. Foamy urine. Unexplained weight gain. Decreased urine production. This may develop slowly and may not be obvious until you have shortness of breath, weakness, or tiredness. Loss of appetite. In some cases, there are no symptoms. How is this  diagnosed? This condition may be diagnosed with two urine tests: A dipstick urine test. A 24-hour urine test where you collect all of the urine that you make over a 24-hour period. If your test results show that you have large amounts of certain proteins in your urine, more tests may be needed to confirm the diagnosis and to find the cause. These tests may include: Blood tests. More urine testing. Getting a sample of your kidney tissue to look at under a microscope(kidney biopsy). How is this treated? This condition may be treated with medicines to control symptoms or to prevent other health problems that may make your condition worse. These medicines may: Decrease inflammation in your kidneys. Lower blood pressure. Lower cholesterol. Help control edema. Further treatment for this condition will depend on the cause. Your health care provider will talk about treatment options with you. Follow these instructions at home: Medicines Take over-the-counter and prescription medicines only as told by your health care provider. Many medicines can make this condition worse. Doses may need to be adjusted. Do not take ibuprofen or other NSAIDs. Do not take any new over-the-counter medicines or nutritional supplements unless approved by your health care provider. General instructions Follow instructions from your health care provider about eating or drinking restrictions. This may include changes to help manage swelling or high blood pressure, such as limiting your intake of salt or fluids. Wear compression stockings as told by your health care provider. These stockings help to prevent blood clots and reduce swelling in your legs. Keep all follow-up visits. This is important. Contact a health care provider if: Your symptoms do not go away as expected or you develop new symptoms. You continue to gain weight. You have increased swelling, pain, or redness of your feet, ankles, or  legs. Get help right  away if: You stop making urine. You have prolonged bleeding. You have shortness of breath or difficulty breathing. You develop chest pain or tightness. You have a severe headache. You have severe weakness. These symptoms may be an emergency. Get help right away. Call 911. Do not wait to see if the symptoms will go away. Do not drive yourself to the hospital. Summary Nephrotic syndrome may be caused by kidney diseases that damage the filters in the kidneys (glomeruli). In some cases, the cause may not be known. This condition may be treated with medicines to control symptoms or to prevent other health problems that may make your condition worse. Take over-the-counter and prescription medicines only as told by your health care provider. Do not take ibuprofen or other NSAIDs, any new over-the-counter medicines, or any nutritional supplements unless approved. This information is not intended to replace advice given to you by your health care provider. Make sure you discuss any questions you have with your health care provider. Document Revised: 06/04/2021 Document Reviewed: 06/04/2021 Elsevier Patient Education  [place].

## 2021-10-20 NOTE — Progress Notes (Signed)
Subjective:  Patient ID: Jeffrey [name], male    DOB: Oct 02, 1966  Age: 55 y.o. MRN: [id number]  CC: Diabetes and Follow-up   HPI Jeffrey [name] is a 55 y.o. year old male with a history of type 2 diabetes mellitus (A1c A1c is 7.6), diabetic neuropathy, nephrotic syndrome status post right great and second toe amputation , hypertension, Nonischemic cardiomyopathy, CHF (EF 30-35% from 06/2021 status post ICD placement in 01/2018) He was hospitalized in 06/2021 for seizure and DKA and questionable TIA.  Interval History: He had a telehealth visit with vascular surgery last month and notes reviewed with plans for Verastem device implant of the right side due to failure of optimization with GDMT.  His gait has been unstable and he has been falling. Has upcoming appointment with Neurology. 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 Acute to subacute punctate infarctions within the pons and the left side of the splenium of the corpus callosum. No evidence of mass effect or hemorrhage.  He has pedal edema and he has been dyspneic, has had orthopnea and PND to the point he has to sit up to sleep.  His lady friend is having to give him an extra half pill of his Lasix he was prescribed 33m of Lasix daily) Yet to see his Cardiologist. Denies presence of chest pain  Compliant with insulin and has not had any hypoglycemic episodes. His friend complains he has daytime somnolence.  Past Medical History:  Diagnosis Date   AICD (automatic cardioverter/defibrillator) present    [employer]   AICD (automatic cardioverter/defibrillator) present    MDT Visia AF MRI   Anemia    CAD (coronary artery disease)    a. cath 01/31/17: 60% 1st RPLB, 60% dist RCA, 55% prox RCA, 10% pro LAD --> Rx TX.    CHF  (congestive heart failure) (HCC)    Chronic systolic CHF (congestive heart failure) ([place] 01/28/2017   1. Echo 01/29/17:  EF 20-25, normal wall motion, mild LAE // [facility]    Coronary artery disease    Diabetes mellitus    type II   Diabetes mellitus without complication ([place]    [place] infection [place] 03/2016   RT FOOT   Dyspnea    History of kidney stones    passed   History of kidney stones    HTN (hypertension)    Hyperlipidemia    Hypertension    Myocardial infarction ([facility], although reported, NICM at cath    NICM (nonischemic cardiomyopathy) ([facility] 02/15/2017   1. Mod non-obs CAD on LHC in 01/2017 - CAD does not explain cardiomyopathy   Renal disorder     Past Surgical History:  Procedure Laterality Date   AIR/FLUID EXCHANGE Right 07/14/2020   Procedure: AIR/FLUID EXCHANGE;  Surgeon: PJalene Mullet MD;  [place]  Service: Ophthalmology;  Laterality: Right;   AMPUTATION Right 04/01/2016   Procedure: Right Great Toe Amputation;  Surgeon: MNewt Minion MD;  [place]  Service: Orthopedics;  Laterality: Right;   AMPUTATION Right 06/19/2016   Procedure: AMPUTATION SECOND TOE;  Surgeon: MMarybelle Killings MD;  [place]  Service: Orthopedics;  Laterality: Right;   BACK SURGERY     for abscess   CARDIAC CATHETERIZATION     ICD IMPLANT N/A 01/15/2018  Procedure: ICD IMPLANT;  Surgeon: Deboraha Sprang, MD;  Location: [place] [facility];  Service: Cardiovascular;  Laterality: N/A;   INJECTION OF SILICONE OIL Right 02/13/5973   Procedure: INJECTION OF SILICONE OIL;  Surgeon: Jalene Mullet, MD;  Location: [place];  Service: Ophthalmology;  Laterality: Right;   INJECTION OF SILICONE OIL Right 1/63/8453   Procedure: INJECTION OF SILICONE OIL;  Surgeon: Jalene Mullet, MD;  Location: [place];  Service: Ophthalmology;  Laterality: Right;   INSERT / REPLACE / REMOVE PACEMAKER     MEMBRANE PEEL Right 08/25/2020   Procedure: MEMBRANE PEEL;  Surgeon: Jalene Mullet, MD;  Location: [place];  Service: Ophthalmology;  Laterality: Right;   PARS PLANA VITRECTOMY Right 07/14/2020   Procedure: PARS PLANA VITRECTOMY WITH 25 GAUGE, Membranetomy, drainage of subretinal fluid;  Surgeon: Jalene Mullet, MD;  Location: [place];  Service: Ophthalmology;  Laterality: Right;   PARS PLANA VITRECTOMY Right 08/25/2020   Procedure: PARS PLANA VITRECTOMY WITH 25 GAUGE;  Surgeon: Jalene Mullet, MD;  Location: [place];  Service: Ophthalmology;  Laterality: Right;   PHOTOCOAGULATION WITH LASER Right 07/14/2020   Procedure: PHOTOCOAGULATION WITH LASER;  Surgeon: Jalene Mullet, MD;  Location: [place];  Service: Ophthalmology;  Laterality: Right;   PHOTOCOAGULATION WITH LASER Right 08/25/2020   Procedure: PHOTOCOAGULATION WITH LASER;  Surgeon: Jalene Mullet, MD;  Location: [place];  Service: Ophthalmology;  Laterality: Right;   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   Procedure: REPAIR OF HEMORRHAGIC DETACHMENT;  Surgeon: Jalene Mullet, MD;  Location: [place];  Service: Ophthalmology;  Laterality: Right;   RIGHT/LEFT HEART CATH AND CORONARY ANGIOGRAPHY N/A 01/31/2017   Procedure: Right/Left Heart Cath and Coronary Angiography;  Surgeon: Troy Sine, MD;  Location: [place] [facility];  Service: Cardiovascular;  Laterality: N/A;   SILICON OIL REMOVAL Right 6/46/8032   Procedure: SILICON OIL REMOVAL;  Surgeon: Jalene Mullet, MD;  Location: [place];  Service: Ophthalmology;  Laterality: Right;    Family History  Problem Relation Age of Onset   Diabetes Mother    Hypertension Mother    Diabetes Father    Heart attack Father    Diabetes Sister    Heart attack Maternal Grandmother     No Known Allergies  Outpatient Medications Prior to Visit  Medication Sig Dispense Refill   Accu-Chek Softclix Lancets lancets Use to check blood sugar three times daily E11.65 100 each 5   albuterol (VENTOLIN HFA) 108 (90 Base) MCG/ACT inhaler Inhale 1-2 puffs into the  lungs every 6 (six) hours as needed for wheezing or shortness of breath. (Patient not taking: No sig reported) 1 each 0   albuterol (VENTOLIN HFA) 108 (90 Base) MCG/ACT inhaler Inhale 1 to 2 puffs by mouth into the lungs every 6 hours as needed for wheezing or shortness of breath. 18 g 0   aspirin 81 MG EC tablet Take 1 tablet (81 mg total) by mouth daily. Swallow whole. 30 tablet 2   Blood Glucose Monitoring Suppl (ACCU-CHEK GUIDE) w/Device KIT Use to check blood sugar three times daily E11.65 1 kit 0   cetirizine (ZYRTEC) 10 MG tablet TAKE 1 TABLET (10 MG TOTAL) BY MOUTH DAILY. (Patient not taking: No sig reported) 30 tablet 0   ferrous sulfate 325 (65 FE) MG tablet Take 1 tablet (325 mg total) by mouth 2 (two) times daily with a meal. 60 tablet 3   glucose blood (ACCU-CHEK GUIDE) test strip Use to check blood sugar three times daily  E11.65 100 each 12   insulin aspart (NOVOLOG FLEXPEN) 100 UNIT/ML FlexPen Inject 0-9 Units into the skin See admin instructions. Inject 0-9 units into the skin 3 times a day with meals, PER SLIDING SCALE:  CBG 70 - 120 =  0 unit CBG 121 - 150: 1 unit  CBG 151 - 200: 2 units  CBG 201 - 250: 3 units  CBG 251 - 300: 5 units  CBG 301 - 350: 7 units  CBG 351 - 400 9 units 3 mL 0   Insulin Pen Needle (BD PEN NEEDLE NANO U/F) 32G X 4 MM MISC USE TO INJECT LANTUS DAILY. MUST USE NEW PEN NEEDLE WITH EACH INJECTION. 100 each 0   magnesium oxide (MAG-OX) 400 MG tablet Take 1 tablet (400 mg total) by mouth daily. (Patient not taking: Reported on 10/20/2021) 15 tablet 0   ondansetron (ZOFRAN) 4 MG tablet Take 1 tablet (4 mg total) by mouth every 6 (six) hours as needed for nausea or vomiting. 12 tablet 0   carvedilol (COREG) 12.5 MG tablet TAKE 1 TABLET (12.5 MG TOTAL) BY MOUTH 2 (TWO) TIMES DAILY WITH A MEAL. 24 tablet 0   furosemide (LASIX) 40 MG tablet TAKE 1.5 TABLETS (60 MG TOTAL) BY MOUTH DAILY. (Patient taking differently: Take 60 mg by mouth daily.) 45 tablet 6    gabapentin (NEURONTIN) 400 MG capsule TAKE 1 CAPSULE BY MOUTH 3 TAKE TIMES DAILY. (Patient taking differently: Take 400 mg by mouth 3 (three) times daily.) 21 capsule 0   hydrALAZINE (APRESOLINE) 25 MG tablet Take 1 tablet (25 mg total) by mouth 3 (three) times daily. 40 tablet 0   isosorbide mononitrate (IMDUR) 30 MG 24 hr tablet Take 1 tablet (30 mg total) by mouth daily. 21 tablet 0   LANTUS SOLOSTAR 100 UNIT/ML Solostar Pen Inject 35 Units into the skin daily. 15 mL 0   Potassium Chloride ER 20 MEQ TBCR Take 2 tablets by mouth daily. (Patient taking differently: Take 40 mEq by mouth daily.) 30 tablet 0   potassium chloride SA (KLOR-CON) 20 MEQ tablet Take 2 tablets (40 mEq total) by mouth daily. 30 tablet 0   rosuvastatin (CRESTOR) 40 MG tablet TAKE 1 TABLET (40 MG TOTAL) BY MOUTH DAILY. (Patient taking differently: Take 40 mg by mouth at bedtime.) 90 tablet 1   No facility-administered medications prior to visit.     ROS Review of Systems  Constitutional:  Negative for activity change and appetite change.  HENT:  Negative for sinus pressure and sore throat.   Eyes:  Negative for visual disturbance.  Respiratory:  Positive for shortness of breath. Negative for cough and chest tightness.   Cardiovascular:  Positive for leg swelling. Negative for chest pain.  Gastrointestinal:  Negative for abdominal distention, abdominal pain, constipation and diarrhea.  Endocrine: Negative.   Genitourinary:  Negative for dysuria.  Musculoskeletal:  Positive for gait problem. Negative for joint swelling and myalgias.  Skin:  Negative for rash.  Allergic/Immunologic: Negative.   Neurological:  Positive for weakness. Negative for light-headedness and numbness.  Psychiatric/Behavioral:  Negative for dysphoric mood and suicidal ideas.   Objective:  BP 130/89   Pulse 81   Ht '5\' 7"'  (1.702 m)   Wt 218 lb 6.4 oz (99.1 kg)   SpO2 98%   BMI 34.21 kg/m   BP/Weight 10/20/2021 10/07/2021 81/85/6314   Systolic BP 970 263 785  Diastolic BP 89 97 885  Wt. (Lbs) 218.4 - -  BMI 34.21 - -  Wt Readings from Last 3 Encounters:  10/20/21 218 lb 6.4 oz (99.1 kg)  08/13/21 219 lb 12.8 oz (99.7 kg)  08/07/21 219 lb 11.2 oz (99.7 kg)      Physical Exam Constitutional:      Appearance: He is well-developed. He is obese.  Cardiovascular:     Rate and Rhythm: Normal rate.     Heart sounds: Normal heart sounds. No murmur heard. Pulmonary:     Effort: Pulmonary effort is normal.     Breath sounds: Normal breath sounds. No wheezing or rales.  Chest:     Chest wall: No tenderness.  Abdominal:     General: Bowel sounds are normal. There is distension.     Palpations: Abdomen is soft. There is no mass.     Tenderness: There is no abdominal tenderness.  Musculoskeletal:        General: Normal range of motion.     Right lower leg: Edema present.     Left lower leg: Edema present.  Neurological:     Mental Status: He is alert and oriented to person, place, and time.  Psychiatric:        Mood and Affect: Mood normal.   Diabetic Foot Exam - Simple   Simple Foot Form Diabetic Foot exam was performed with the following findings: Yes 10/20/2021  5:41 PM  Visual Inspection See comments: Yes Sensation Testing Intact to touch and monofilament testing bilaterally: Yes Pulse Check Posterior Tibialis and Dorsalis pulse intact bilaterally: Yes Comments Amputation of medial 3 toes on the right foot     CMP Latest Ref Rng & Units 10/07/2021 08/13/2021 08/07/2021  Glucose 70 - 99 mg/dL 62(L) 105(H) 171(H)  BUN 6 - 20 mg/dL 18 26(H) 19  Creatinine 0.61 - 1.24 mg/dL 2.10(H) 1.71(H) 1.97(H)  Sodium 135 - 145 mmol/L 138 139 137  Potassium 3.5 - 5.1 mmol/L 3.6 3.1(L) 3.1(L)  Chloride 98 - 111 mmol/L 106 105 108  CO2 22 - 32 mmol/L '26 24 23  ' Calcium 8.9 - 10.3 mg/dL 9.1 8.8(L) 8.1(L)  Total Protein 6.5 - 8.1 g/dL - - -  Total Bilirubin 0.3 - 1.2 mg/dL - - -  Alkaline Phos 38 - 126 U/L - - -  AST  15 - 41 U/L - - -  ALT 0 - 44 U/L - - -    Lipid Panel     Component Value Date/Time   CHOL 201 (H) 07/02/2021 0037   CHOL 137 04/25/2019 0925   TRIG 225 (H) 07/02/2021 0037   HDL 36 (L) 07/02/2021 0037   HDL 36 (L) 04/25/2019 0925   CHOLHDL 5.6 07/02/2021 0037   VLDL 45 (H) 07/02/2021 0037   LDLCALC 120 (H) 07/02/2021 0037   LDLCALC 57 04/25/2019 0925    CBC    Component Value Date/Time   WBC 6.6 10/07/2021 1343   RBC 4.66 10/07/2021 1343   HGB 11.6 (L) 10/07/2021 1343   HCT 37.8 (L) 10/07/2021 1343   PLT 268 10/07/2021 1343   MCV 81.1 10/07/2021 1343   MCH 24.9 (L) 10/07/2021 1343   MCHC 30.7 10/07/2021 1343   RDW 15.2 10/07/2021 1343   LYMPHSABS 1.1 08/06/2021 1737   MONOABS 0.4 08/06/2021 1737   EOSABS 0.1 08/06/2021 1737   BASOSABS 0.0 08/06/2021 1737    Lab Results  Component Value Date   HGBA1C 7.6 (A) 10/20/2021    Assessment & Plan:  1. Diabetic nephropathy associated with type 2 diabetes mellitus (HCC) A1c is 7.6;  goal is <8.0 due to multiple co- morbidities Continue current regimen Counseled on blood pressure goal of less than 130/80, low-sodium, DASH diet, medication compliance, 150 minutes of moderate intensity exercise per week. Discussed medication compliance, adverse effects. - POCT glucose (manual entry) - POCT glycosylated hemoglobin (Hb A1C) - Ambulatory referral to Nephrology - Magnesium - LANTUS SOLOSTAR 100 UNIT/ML Solostar Pen; Inject 35 Units into the skin daily.  Dispense: 30 mL; Refill: 6  2. Hypertensive heart disease with chronic systolic congestive heart failure (HCC) S/p ICD EF of 30-35%, LV hypokinesis from echo of 06/2021 He is in fluid overload Lasix dose increased Anticipate worsening of renal function hence will check in 1 week - tough to strike a balance between euvolemia and maintaining renal function He needs to see Cardiology and I have placed referral as he does not recall who his Cardiologist Continue GDMT -  furosemide (LASIX) 40 MG tablet; Take 1 tablet (40 mg total) by mouth 2 (two) times daily.  Dispense: 60 tablet; Refill: 6 - Ambulatory referral to Cardiology - Brain natriuretic peptide - Basic Metabolic Panel; Future - carvedilol (COREG) 12.5 MG tablet; TAKE 1 TABLET (12.5 MG TOTAL) BY MOUTH 2 (TWO) TIMES DAILY WITH A MEAL.  Dispense: 60 tablet; Refill: 6 - hydrALAZINE (APRESOLINE) 25 MG tablet; Take 1 tablet (25 mg total) by mouth 3 (three) times daily.  Dispense: 90 tablet; Refill: 6 - isosorbide mononitrate (IMDUR) 30 MG 24 hr tablet; Take 1 tablet (30 mg total) by mouth daily.  Dispense: 30 tablet; Refill: 6  3. Diabetic polyneuropathy associated with type 2 diabetes mellitus (HCC) Stable but due to somnolence I will reduce his dose and adjust it to night time dosin - gabapentin (NEURONTIN) 300 MG capsule; Take 2 capsules (600 mg total) by mouth at bedtime.  Dispense: 60 capsule; Refill: 6  4. Hyperlipidemia associated with type 2 diabetes mellitus ([place]) Uncontrolled from 06/2021 Continue Crestor Low cholesterol diet - rosuvastatin (CRESTOR) 40 MG tablet; TAKE 1 TABLET (40 MG TOTAL) BY MOUTH DAILY.  Dispense: 90 tablet; Refill: 1  5. Nephrotic syndrome Could explain edema Lasix dose increased Referred to Nephrology  6. Stage 3b chronic kidney disease (HCC) Combination of Hypertensive, Diabetic Nephropathy Avoid Nephrotoxins - Ambulatory referral to Nephrology  7. Hypertension associated with stage 3b chronic kidney disease due to type 2 diabetes mellitus ([place]) Controlled Counseled on blood pressure goal of less than 130/80, low-sodium, DASH diet, medication compliance, 150 minutes of moderate intensity exercise per week. Discussed medication compliance, adverse effects.  - hydrALAZINE (APRESOLINE) 25 MG tablet; Take 1 tablet (25 mg total) by mouth 3 (three) times daily.  Dispense: 90 tablet; Refill: 6 - isosorbide mononitrate (IMDUR) 30 MG 24 hr tablet; Take 1 tablet (30 mg  total) by mouth daily.  Dispense: 30 tablet; Refill: 6  8. 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 Daytime somnolence Decreased Gabapentin dose Advised to take Gabapentin at night   Meds ordered this encounter  Medications   furosemide (LASIX) 40 MG tablet    Sig: Take 1 tablet (40 mg total) by mouth 2 (two) times daily.    Dispense:  60 tablet    Refill:  6    Dose increase   carvedilol (COREG) 12.5 MG tablet    Sig: TAKE 1 TABLET (12.5 MG TOTAL) BY MOUTH 2 (TWO) TIMES DAILY WITH A MEAL.    Dispense:  60 tablet  Refill:  6   gabapentin (NEURONTIN) 300 MG capsule    Sig: Take 2 capsules (600 mg total) by mouth at bedtime.    Dispense:  60 capsule    Refill:  6    Dose change   hydrALAZINE (APRESOLINE) 25 MG tablet    Sig: Take 1 tablet (25 mg total) by mouth 3 (three) times daily.    Dispense:  90 tablet    Refill:  6   isosorbide mononitrate (IMDUR) 30 MG 24 hr tablet    Sig: Take 1 tablet (30 mg total) by mouth daily.    Dispense:  30 tablet    Refill:  6   LANTUS SOLOSTAR 100 UNIT/ML Solostar Pen    Sig: Inject 35 Units into the skin daily.    Dispense:  30 mL    Refill:  6   potassium chloride SA (KLOR-CON) 20 MEQ tablet    Sig: Take 2 tablets (40 mEq total) by mouth daily.    Dispense:  30 tablet    Refill:  6   rosuvastatin (CRESTOR) 40 MG tablet    Sig: TAKE 1 TABLET (40 MG TOTAL) BY MOUTH DAILY.    Dispense:  90 tablet    Refill:  1    Follow-up: Return in about 3 months (around 01/20/2022) for Chronic medical conditions.    52 minutes of total face to face time spent including median intraservice time reviewing previous notes and test results, counseling patient on diagnosis of Edema, dyspnea in addition to management of chronic medical conditions.Time also spent ordering medications, investigations and documenting in the chart.   All questions were answered to the patient's satisfaction   Charlott Rakes, MD, FAAFP. [facility] and [place], [place]   10/20/2021, 5:52 PM

## 2021-10-20 NOTE — Progress Notes (Signed)
Balance off Swelling in feet legs, and hands.

## 2021-10-21 DIAGNOSIS — Z23 Encounter for immunization: Secondary | ICD-10-CM | POA: Diagnosis not present

## 2021-10-21 LAB — BRAIN NATRIURETIC PEPTIDE: BNP: 70.6 pg/mL (ref 0.0–100.0)

## 2021-10-21 LAB — MAGNESIUM: Magnesium: 1.6 mg/dL (ref 1.6–2.3)

## 2021-10-22 ENCOUNTER — Telehealth: Payer: Self-pay

## 2021-10-22 NOTE — Telephone Encounter (Signed)
Pt was called and results were given to patients spouse.

## 2021-10-22 NOTE — Telephone Encounter (Signed)
-----   Message from Charlott Rakes, MD sent at 10/22/2021  8:47 AM EST ----- Please inform him that magnesium is normal and he does not need to continue taking it.  Labs do not reveal evidence of fluid overload.  At his last visit I had placed a cardiology and nephrology referral and he should be hearing from them soon regarding an appointment.

## 2021-10-27 ENCOUNTER — Other Ambulatory Visit: Payer: Self-pay

## 2021-10-29 ENCOUNTER — Telehealth: Payer: Self-pay

## 2021-10-29 ENCOUNTER — Other Ambulatory Visit: Payer: Self-pay

## 2021-10-29 DIAGNOSIS — I5022 Chronic systolic (congestive) heart failure: Secondary | ICD-10-CM

## 2021-10-29 NOTE — Telephone Encounter (Signed)
Multiple attempts made to reach patient to schedule Bar[facility] surgery. Left messages for patient to return call on each attempt with no response. Will mail unable to reach letter.

## 2021-11-03 ENCOUNTER — Other Ambulatory Visit: Payer: Self-pay

## 2021-11-08 ENCOUNTER — Other Ambulatory Visit: Payer: Self-pay

## 2021-11-08 NOTE — Progress Notes (Signed)
Cardiology Office Note Date:  11/09/2021  Patient ID:  Jeffrey [name], Jeffrey [name] September 04, 1966, MRN [id number] PCP:  Charlott Rakes, MD  Cardiologist:  Dr. Acie Fredrickson Electrophysiologist: Dr. Caryl Comes    Chief Complaint: lost to f/u  History of Present Illness: Jeffrey [name] is a 55 y.o. male with history of non-obstructive CAD, NICM, IDDM, HTN, HLD, prior toe amputations 2/2 osteomyelitis, chronic CHF (systolic), ICD, CKD (III)  He comes in today to be seen for Dr. Caryl Comes, last seen by him July 2020, was doing well, no changes were made.  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 No seizures seen on EEG, K+ 2.7 and gluc > 700. BP 201/104 Noted poor medication and diet compliance at home despite his sister trying to urge him to do so.  He also suffered acute/subact ue strokes Cardiology consulted, not felt to be volume OL, meds adjusted.  Discussed Delene Loll though held off given AKI/CKD and recommended try to get started out pt  August  hospitalized with hypoglycemia, BS 20's associated with AMS and LOC K+ and mag low again, AKI on CKD his entresto, K+  and aldactone stopped ER visit in October for hypoglycemia and hypertension, took his insulin without eating not his BP meds that AM  He saw Dr. Trula Slade 10/11/21 for Barostim, planned to proceed, looks like he is scheduled for 11/26/21  TODAY He is accompanied by his girlfriend who is very knowledgeable about his medical issues and meedicines. Since his hospitalization in July he has struggled with his DM and volume.   She has been trying to get him to eat better and take better care of himself. They both report compliance is better the last couple months and hehas started to do better with his diet as well. For the last 3 mo or so about 1-2/week she has given him an extra lasix 2/2 SOB and swelling In the last 2 weeks or so this has leveled off and has not  required as much extra lasix as he did a couple months ago  Since his hospital stay in July she says he has never been the same, slower all around, slower thinking and slower physically, they see neurology soon.  He gets the barostim in a couple weeks  He sleeps with several pillows Gets SOB with minimal exertion, an example walking from the witing room to the room he was winded. He is very sedentary as well  No CP NO near syncope or syncope No shocks  Device information MDT single chamber ICD implanted 01/15/2018   Past Medical History:  Diagnosis Date   AICD (automatic cardioverter/defibrillator) present    Medtronic   AICD (automatic cardioverter/defibrillator) present    MDT Visia AF MRI   Anemia    CAD (coronary artery disease)    a. cath 01/31/17: 60% 1st RPLB, 60% dist RCA, 55% prox RCA, 10% pro LAD --> Rx TX.    CHF (congestive heart failure) (HCC)    Chronic systolic CHF (congestive heart failure) ([place]) 01/28/2017   1. Echo 01/29/17:  EF 20-25, normal wall motion, mild LAE // 2. EF 10-15 by [facility] 01/2017    Coronary artery disease    Diabetes mellitus    type II   Diabetes mellitus without complication ([place])    Diabetic foot infection ([place]) 03/2016   RT FOOT   Dyspnea    History of kidney stones    passed  History of kidney stones    HTN (hypertension)    Hyperlipidemia    Hypertension    Myocardial infarction [facility]), although reported, NICM at cath    NICM (nonischemic cardiomyopathy) ([place]) 02/15/2017   1. Mod non-obs CAD on LHC in 01/2017 - CAD does not explain cardiomyopathy   Renal disorder     Past Surgical History:  Procedure Laterality Date   AIR/FLUID EXCHANGE Right 07/14/2020   Procedure: AIR/FLUID EXCHANGE;  Surgeon: Jalene Mullet, MD;  Location: [place];  Service: Ophthalmology;  Laterality: Right;   AMPUTATION Right 04/01/2016   Procedure: Right Great Toe Amputation;  Surgeon: Newt Minion, MD;  Location: [place];  Service: Orthopedics;  Laterality:  Right;   AMPUTATION Right 06/19/2016   Procedure: AMPUTATION SECOND TOE;  Surgeon: Marybelle Killings, MD;  Location: [place];  Service: Orthopedics;  Laterality: Right;   BACK SURGERY     for abscess   CARDIAC CATHETERIZATION     ICD IMPLANT N/A 01/15/2018   Procedure: ICD IMPLANT;  Surgeon: Deboraha Sprang, MD;  Location: [place] [facility];  Service: Cardiovascular;  Laterality: N/A;   INJECTION OF SILICONE OIL Right 07/12/8562   Procedure: INJECTION OF SILICONE OIL;  Surgeon: Jalene Mullet, MD;  Location: [place];  Service: Ophthalmology;  Laterality: Right;   INJECTION OF SILICONE OIL Right 1/49/7026   Procedure: INJECTION OF SILICONE OIL;  Surgeon: Jalene Mullet, MD;  Location: [place];  Service: Ophthalmology;  Laterality: Right;   INSERT / REPLACE / REMOVE PACEMAKER     MEMBRANE PEEL Right 08/25/2020   Procedure: MEMBRANE PEEL;  Surgeon: Jalene Mullet, MD;  Location: [place];  Service: Ophthalmology;  Laterality: Right;   PARS PLANA VITRECTOMY Right 07/14/2020   Procedure: PARS PLANA VITRECTOMY WITH 25 GAUGE, Membranetomy, drainage of subretinal fluid;  Surgeon: Jalene Mullet, MD;  Location: [place];  Service: Ophthalmology;  Laterality: Right;   PARS PLANA VITRECTOMY Right 08/25/2020   Procedure: PARS PLANA VITRECTOMY WITH 25 GAUGE;  Surgeon: Jalene Mullet, MD;  Location: [place];  Service: Ophthalmology;  Laterality: Right;   PHOTOCOAGULATION WITH LASER Right 07/14/2020   Procedure: PHOTOCOAGULATION WITH LASER;  Surgeon: Jalene Mullet, MD;  Location: [place];  Service: Ophthalmology;  Laterality: Right;   PHOTOCOAGULATION WITH LASER Right 08/25/2020   Procedure: PHOTOCOAGULATION WITH LASER;  Surgeon: Jalene Mullet, MD;  Location: [place];  Service: Ophthalmology;  Laterality: Right;   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   Procedure: REPAIR OF HEMORRHAGIC DETACHMENT;  Surgeon: Jalene Mullet, MD;  Location: [place];  Service: Ophthalmology;  Laterality: Right;   RIGHT/LEFT HEART  CATH AND CORONARY ANGIOGRAPHY N/A 01/31/2017   Procedure: Right/Left Heart Cath and Coronary Angiography;  Surgeon: Troy Sine, MD;  Location: [place] [facility];  Service: Cardiovascular;  Laterality: N/A;   SILICON OIL REMOVAL Right 3/78/5885   Procedure: SILICON OIL REMOVAL;  Surgeon: Jalene Mullet, MD;  Location: [place];  Service: Ophthalmology;  Laterality: Right;    Current Outpatient Medications  Medication Sig Dispense Refill   Accu-Chek Softclix Lancets lancets Use to check blood sugar three times daily E11.65 100 each 5   albuterol (VENTOLIN HFA) 108 (90 Base) MCG/ACT inhaler Inhale 1-2 puffs into the lungs every 6 (six) hours as needed for wheezing or shortness of breath. 1 each 0   albuterol (VENTOLIN HFA) 108 (90 Base) MCG/ACT inhaler Inhale 1 to 2 puffs by mouth into the lungs every 6 hours as needed for wheezing or shortness  of breath. 18 g 0   aspirin 81 MG EC tablet Take 1 tablet (81 mg total) by mouth daily. Swallow whole. 30 tablet 2   Blood Glucose Monitoring Suppl (ACCU-CHEK GUIDE) w/Device KIT Use to check blood sugar three times daily E11.65 1 kit 0   carvedilol (COREG) 12.5 MG tablet TAKE 1 TABLET (12.5 MG TOTAL) BY MOUTH 2 (TWO) TIMES DAILY WITH A MEAL. 60 tablet 6   cetirizine (ZYRTEC) 10 MG tablet TAKE 1 TABLET (10 MG TOTAL) BY MOUTH DAILY. 30 tablet 0   ferrous sulfate 325 (65 FE) MG tablet Take 1 tablet (325 mg total) by mouth 2 (two) times daily with a meal. 60 tablet 3   furosemide (LASIX) 40 MG tablet Take 1 tablet (40 mg total) by mouth 2 (two) times daily. 60 tablet 6   gabapentin (NEURONTIN) 300 MG capsule Take 2 capsules (600 mg total) by mouth at bedtime. 60 capsule 6   glucose blood (ACCU-CHEK GUIDE) test strip Use to check blood sugar three times daily E11.65 100 each 12   hydrALAZINE (APRESOLINE) 25 MG tablet Take 1 tablet (25 mg total) by mouth 3 (three) times daily. 90 tablet 6   insulin aspart (NOVOLOG FLEXPEN) 100 UNIT/ML FlexPen Inject 0-9 Units  into the skin See admin instructions. Inject 0-9 units into the skin 3 times a day with meals, PER SLIDING SCALE:  CBG 70 - 120 =  0 unit CBG 121 - 150: 1 unit  CBG 151 - 200: 2 units  CBG 201 - 250: 3 units  CBG 251 - 300: 5 units  CBG 301 - 350: 7 units  CBG 351 - 400 9 units 3 mL 0   Insulin Pen Needle (BD PEN NEEDLE NANO U/F) 32G X 4 MM MISC USE TO INJECT LANTUS DAILY. MUST USE NEW PEN NEEDLE WITH EACH INJECTION. 100 each 0   isosorbide mononitrate (IMDUR) 30 MG 24 hr tablet Take 1 tablet (30 mg total) by mouth daily. 30 tablet 6   LANTUS SOLOSTAR 100 UNIT/ML Solostar Pen Inject 35 Units into the skin daily. 30 mL 6   magnesium oxide (MAG-OX) 400 MG tablet Take 1 tablet (400 mg total) by mouth daily. 15 tablet 0   ondansetron (ZOFRAN) 4 MG tablet Take 1 tablet (4 mg total) by mouth every 6 (six) hours as needed for nausea or vomiting. 12 tablet 0   potassium chloride SA (KLOR-CON) 20 MEQ tablet Take 2 tablets (40 mEq total) by mouth daily. 30 tablet 6   rosuvastatin (CRESTOR) 40 MG tablet TAKE 1 TABLET (40 MG TOTAL) BY MOUTH DAILY. 90 tablet 1   No current facility-administered medications for this visit.    Allergies:   Patient has no known allergies.   Social History:  The patient  reports that he has never smoked. He has never used smokeless tobacco. He reports that he does not drink alcohol and does not use drugs.   Family History:  The patient's family history includes Diabetes in his father, mother, and sister; Heart attack in his father and maternal grandmother; Hypertension in his mother.  ROS:  Please see the history of present illness.    All other systems are reviewed and otherwise negative.   PHYSICAL EXAM:  VS:  BP (!) 160/70   Pulse 80   Ht _0  (1.702 m)   Wt 217 lb 12.8 oz (98.8 kg)   SpO2 97%   BMI 34.11 kg/m  BMI: Body mass index is 34.11 kg/m. Well  nourished, well developed, in no acute distress HEENT: normocephalic, atraumatic Neck: no JVD, carotid  bruits or masses Cardiac:  RRR; no significant murmurs, no rubs, or gallops Lungs:  CTA b/l, no wheezing, rhonchi or rales Abd: soft, nontender MS: no deformity or atrophy Ext: trace if any edema Skin: warm and dry, no rash Neuro:  No gross deficits appreciated Psych: euthymic mood, full affect  CD site is stable, no tethering or discomfort   EKG:  not done today  Device interrogation done today and reviewed by myself:  Battery and lead measurement are stable No arrhythmias His device is a Visia and no Afib detected either   Echo 07/01/21: IMPRESSIONS   1. Left ventricular ejection fraction, by estimation, is 30 to 35%. The  left ventricle has moderately decreased function. The left ventricle  demonstrates global hypokinesis. Left ventricular diastolic parameters are  consistent with Grade I diastolic  dysfunction (impaired relaxation).   2. Right ventricular systolic function is normal. The right ventricular  size is normal. There is normal pulmonary artery systolic pressure.   3. The mitral valve is grossly normal. No evidence of mitral valve  regurgitation. No evidence of mitral stenosis.   4. The aortic valve was not well visualized. There is mild thickening of  the aortic valve. Aortic valve regurgitation is not visualized. Mild  aortic valve sclerosis is present, with no evidence of aortic valve  stenosis.   5. The inferior vena cava is normal in size with greater than 50%  respiratory variability, suggesting right atrial pressure of 3 mmHg.    01/14/18: TTE Study Conclusions - Left ventricle: The cavity size was normal. Wall thickness was   increased in a pattern of mild LVH. Systolic function was   severely reduced. The estimated ejection fraction was 20%.   Diffuse hypokinesis. Features are consistent with a pseudonormal   left ventricular filling pattern, with concomitant abnormal   relaxation and increased filling pressure (grade 2 diastolic   dysfunction).  Doppler parameters are consistent with high   ventricular filling pressure. - Left atrium: The atrium was moderately dilated. - Right ventricle: The cavity size was mildly dilated. Systolic   function was moderately to severely reduced.   01/29/17: TTE w/LVEF 20-25%   01/31/17: LHC 1st RPLB lesion, 60 %stenosed. Dist RCA lesion, 60 %stenosed. Prox RCA lesion, 55 %stenosed. Prox LAD lesion, 10 %stenosed. There is severe left ventricular systolic dysfunction. LV end diastolic pressure is mildly elevated. Severe global LV dysfunction with an ejection fraction of 10-15%.  The pattern is one of a nonischemic cardiomyopathy. Mild coronary obstructive disease with smooth 10% narrowing in the LAD; normal ramus intermediate, normal left circumflex; and RCA with 50-60% proximal stenosis and distal tapering of 60%.  Prior to giving rise to 3 small distal branches with 60% narrowing in a small inferior LV branch.  The patient's LV dysfunction is out of proportion to his CAD. RECOMMENDATION: With the patient's severe LV dysfunction, it is recommended that the patient be transferred to 4 N stepdown unit rather than return back to [facility].  Initiation of medical therapy with carvedilol, spironolactone, and probable initiation of angiotension receptorblocker/neprilysin inhibition therapy.  Consider short-term life-vest prior to discharge to allow for possible medication induced improvement in LV function.   07/31/14: TTE [facility]) LVEF 45-50% 10/01/11: [facility], TTE LVEF 55-60%  Recent Labs: 03/30/2021: NT-Pro BNP 290 07/01/2021: TSH 0.709 08/06/2021: ALT 24 10/07/2021: BUN 18; Creatinine, Ser 2.10; Hemoglobin 11.6; Platelets 268; Potassium 3.6; Sodium 138  10/20/2021: BNP 70.6; Magnesium 1.6  07/02/2021: Cholesterol 201; HDL 36; LDL Cholesterol 120; Total CHOL/HDL Ratio 5.6; Triglycerides 225; VLDL 45   CrCl cannot be calculated ([name] most recent lab result is older than the maximum 21 days  allowed.).   Wt Readings from Last 3 Encounters:  11/09/21 217 lb 12.8 oz (98.8 kg)  10/20/21 218 lb 6.4 oz (99.1 kg)  08/13/21 219 lb 12.8 oz (99.7 kg)     Other studies reviewed: Additional studies/records reviewed today include: summarized above  ASSESSMENT AND PLAN:  ICD Intact function No programming changes made  NICM Chronic CHF Scheduled for barostim w/Dr. Trula Slade Renal function has limited meds it seems Lost to EP/cardiology follow up for a coupl years Currently appears euvolemic by exam and OptiVol Will get a BMET see if there is room to add back his aldactone  He has not seen Dr. Caryl Comes in a couple years Will have him see MD next, in a couple monthspost barostim and see how he is doing  HTN Left without taking his meds this AM   Disposition: F/u with remotes as usual, and as above  Current medicines are reviewed at length with the patient today.  The patient did not have any concerns regarding medicines.  Venetia Night, PA-C 11/09/2021 9:51 AM     [facility] [address] [place] [address] [phone] (office)  [phone] (fax)

## 2021-11-09 ENCOUNTER — Other Ambulatory Visit: Payer: Self-pay

## 2021-11-09 ENCOUNTER — Ambulatory Visit (INDEPENDENT_AMBULATORY_CARE_PROVIDER_SITE_OTHER): Payer: Medicare Other | Admitting: Physician Assistant

## 2021-11-09 ENCOUNTER — Encounter: Payer: Self-pay | Admitting: Physician Assistant

## 2021-11-09 VITALS — BP 160/70 | HR 80 | Ht 67.0 in | Wt 217.8 lb

## 2021-11-09 DIAGNOSIS — I428 Other cardiomyopathies: Secondary | ICD-10-CM | POA: Diagnosis not present

## 2021-11-09 DIAGNOSIS — Z79899 Other long term (current) drug therapy: Secondary | ICD-10-CM | POA: Diagnosis not present

## 2021-11-09 DIAGNOSIS — Z95810 Presence of automatic (implantable) cardiac defibrillator: Secondary | ICD-10-CM | POA: Diagnosis not present

## 2021-11-09 DIAGNOSIS — I10 Essential (primary) hypertension: Secondary | ICD-10-CM

## 2021-11-09 DIAGNOSIS — I5022 Chronic systolic (congestive) heart failure: Secondary | ICD-10-CM

## 2021-11-09 LAB — BASIC METABOLIC PANEL
BUN/Creatinine Ratio: 14 (ref 9–20)
BUN: 32 mg/dL — ABNORMAL HIGH (ref 6–24)
CO2: 23 mmol/L (ref 20–29)
Calcium: 8.7 mg/dL (ref 8.7–10.2)
Chloride: 102 mmol/L (ref 96–106)
Creatinine, Ser: 2.32 mg/dL — ABNORMAL HIGH (ref 0.76–1.27)
Glucose: 264 mg/dL — ABNORMAL HIGH (ref 70–99)
Potassium: 4 mmol/L (ref 3.5–5.2)
Sodium: 137 mmol/L (ref 134–144)
eGFR: 32 mL/min/{1.73_m2} — ABNORMAL LOW (ref >59)

## 2021-11-09 LAB — MAGNESIUM: Magnesium: 1.6 mg/dL (ref 1.6–2.3)

## 2021-11-09 NOTE — Patient Instructions (Addendum)
Medication Instructions:   Your physician recommends that you continue on your current medications as directed. Please refer to the Current Medication list given to you today.  *If you need a refill on your cardiac medications before your next appointment, please call your pharmacy*   Lab Work:  BMET AND [place]   If you have labs (blood work) drawn today and your tests are completely normal, you will receive your results only by: [place] (if you have MyChart) OR A paper copy in the mail If you have any lab test that is abnormal or we need to change your treatment, we will call you to review the results.   Testing/Procedures: NONE ORDERED  TODAY    Follow-Up: At [facility], you and your health needs are our priority.  As part of our continuing mission to provide you with exceptional heart care, we have created designated Provider Care Teams.  These Care Teams include your primary Cardiologist (physician) and Advanced Practice Providers (APPs -  Physician Assistants and Nurse Practitioners) who all work together to provide you with the care you need, when you need it.  We recommend signing up for the patient portal called "MyChart".  Sign up information is provided on this After Visit Summary.  MyChart is used to connect with patients for Virtual Visits (Telemedicine).  Patients are able to view lab/test results, encounter notes, upcoming appointments, etc.  Non-urgent messages can be sent to your provider as well.   To learn more about what you can do with MyChart, go to [email or web address].    Your next appointment:   2-3 month(s)  ( CONTACT [facility] FOR EP SCHEDULING ISSUES )   The format for your next appointment:   In Person  Provider:   Virl Axe, MD  ONLY  Other Instructions

## 2021-11-10 ENCOUNTER — Other Ambulatory Visit: Payer: Self-pay | Admitting: *Deleted

## 2021-11-10 DIAGNOSIS — Z79899 Other long term (current) drug therapy: Secondary | ICD-10-CM

## 2021-11-11 ENCOUNTER — Other Ambulatory Visit: Payer: Self-pay

## 2021-11-11 MED ORDER — T.E.D. KNEE LENGTH/S-LONG MISC: 0 refills | Status: AC

## 2021-11-11 MED ORDER — TAMSULOSIN HCL 0.4 MG PO CAPS
ORAL_CAPSULE | ORAL | 3 refills | Status: DC
  Filled 2021-11-11: qty 30, 30d supply, fill #0

## 2021-11-15 ENCOUNTER — Other Ambulatory Visit: Payer: Self-pay | Admitting: Family Medicine

## 2021-11-15 ENCOUNTER — Other Ambulatory Visit: Payer: Self-pay

## 2021-11-15 MED ORDER — NOVOLOG FLEXPEN 100 UNIT/ML ~~LOC~~ SOPN
0.0000 [IU] | PEN_INJECTOR | SUBCUTANEOUS | 1 refills | Status: DC
  Filled 2021-11-15: qty 3, 11d supply, fill #0
  Filled 2021-12-23: qty 6, 22d supply, fill #0

## 2021-11-15 NOTE — Telephone Encounter (Signed)
Requested Prescriptions  Pending Prescriptions Disp Refills  . insulin aspart (NOVOLOG FLEXPEN) 100 UNIT/ML FlexPen 3 mL 1    Sig: Inject 0-9 Units into the skin See admin instructions. Inject 0-9 units into the skin 3 times a day with meals, PER SLIDING SCALE:  CBG 70 - 120 =  0 unit CBG 121 - 150: 1 unit  CBG 151 - 200: 2 units  CBG 201 - 250: 3 units  CBG 251 - 300: 5 units  CBG 301 - 350: 7 units  CBG 351 - 400 9 units     Endocrinology:  Diabetes - Insulins Passed - 11/15/2021  3:32 PM      Passed - HBA1C is between 0 and 7.9 and within 180 days    HbA1c, POC (controlled diabetic range)  Date Value Ref Range Status  10/20/2021 7.6 (A) 0.0 - 7.0 % Final         Passed - Valid encounter within last 6 months    Recent Outpatient Visits          3 weeks ago Diabetic nephropathy associated with type 2 diabetes mellitus ([place])   [place], [place], MD   1 month ago Uncontrolled type 2 diabetes mellitus with hyperglycemia, with long-term current use of insulin [facility])   [place], [place], [place]   3 months ago    [place], [place], [place]   9 months ago Hypertensive heart disease with chronic systolic congestive heart failure [facility])   [place], Enobong, MD   10 months ago Non-compliance   [place], [place], MD      Future Appointments            In 2 months Charlott Rakes, MD [place]

## 2021-11-16 ENCOUNTER — Other Ambulatory Visit: Payer: Self-pay

## 2021-11-17 ENCOUNTER — Other Ambulatory Visit: Payer: Self-pay | Admitting: Nephrology

## 2021-11-17 DIAGNOSIS — N1832 Chronic kidney disease, stage 3b: Secondary | ICD-10-CM

## 2021-11-23 ENCOUNTER — Other Ambulatory Visit: Payer: Self-pay

## 2021-11-23 ENCOUNTER — Encounter: Payer: Self-pay | Admitting: Internal Medicine

## 2021-11-23 NOTE — Progress Notes (Signed)
Surgical Instructions    Your procedure is scheduled on Friday December 16th.  Report to [facility] Main Entrance "A" at 10 A.M., then check in with the Admitting office.  Call this number if you have problems the morning of surgery:  [phone]   If you have any questions prior to your surgery date call [phone]: Open Monday-Friday 8am-4pm    Remember:  Do not eat or drink anything after midnight the night before your surgery     Take these medicines the morning of surgery with A SIP OF WATER aspirin 81 MG EC tablet carvedilol (COREG) 12.5 MG tablet cetirizine (ZYRTEC) 10 MG tablet hydrALAZINE (APRESOLINE) 25 MG tablet isosorbide mononitrate (IMDUR) 30 MG 24 hr tablet  IF NEEDED  albuterol (VENTOLIN HFA) 108 (90 Base) MCG/ACT inhaler - please bring with you to the hospital ondansetron (ZOFRAN) 4 MG tablet rosuvastatin (CRESTOR) 40 MG tablet    As of today, STOP taking any Aspirin (unless otherwise instructed by your surgeon) Aleve, Naproxen, Ibuprofen, Motrin, Advil, Goody's, BC's, all herbal medications, fish oil, and all vitamins.   WHAT DO I DO ABOUT MY DIABETES MEDICATION?   Do not take oral diabetes medicines (pills) the morning of surgery.'  DAY before surgery Take normal dose of Novolog insulin before meals but NO bedtime dose.  THE NIGHT BEFORE SURGERY, take ______17_____ units of ____Lantus_______insulin.      The day of surgery, do not take other diabetes injectables, including Byetta (exenatide), Bydureon (exenatide ER), Victoza (liraglutide), or Trulicity (dulaglutide).  If your CBG is greater than 220 mg/dL, you may take  of your sliding scale (correction) dose of insulin.   HOW TO MANAGE YOUR DIABETES BEFORE AND AFTER SURGERY  Why is it important to control my blood sugar before and after surgery? Improving blood sugar levels before and after surgery helps healing and can limit problems. A way of improving blood sugar control is eating a  healthy diet by:  Eating less sugar and carbohydrates  Increasing activity/exercise  Talking with your doctor about reaching your blood sugar goals High blood sugars (greater than 180 mg/dL) can raise your risk of infections and slow your recovery, so you will need to focus on controlling your diabetes during the weeks before surgery. Make sure that the doctor who takes care of your diabetes knows about your planned surgery including the date and location.  How do I manage my blood sugar before surgery? Check your blood sugar at least 4 times a day, starting 2 days before surgery, to make sure that the level is not too high or low.  Check your blood sugar the morning of your surgery when you wake up and every 2 hours until you get to the Short Stay unit.  If your blood sugar is less than 70 mg/dL, you will need to treat for low blood sugar: Do not take insulin. Treat a low blood sugar (less than 70 mg/dL) with  cup of clear juice (cranberry or apple), 4 glucose tablets, OR glucose gel. Recheck blood sugar in 15 minutes after treatment (to make sure it is greater than 70 mg/dL). If your blood sugar is not greater than 70 mg/dL on recheck, call [phone] for further instructions. Report your blood sugar to the short stay nurse when you get to Short Stay.  If you are admitted to the hospital after surgery: Your blood sugar will be checked by the staff and you will probably be given insulin after surgery (instead of oral diabetes medicines) to  make sure you have good blood sugar levels. The goal for blood sugar control after surgery is 80-180 mg/dL.     After your COVID test   You are not required to quarantine however you are required to wear a well-fitting mask when you are out and around people not in your household.  If your mask becomes wet or soiled, replace with a new one.  Wash your hands often with soap and water for 20 seconds or clean your hands with an alcohol-based hand  sanitizer that contains at least 60% alcohol.  Do not share personal items.  Notify your provider: if you are in close contact with someone who has COVID  or if you develop a fever of 100.4 or greater, sneezing, cough, sore throat, shortness of breath or body aches.             Do not wear jewelry  Do not wear lotions, powders, colognes, or deodorant. Do not shave 48 hours prior to surgery.  Men may shave face and neck. Do not bring valuables to the hospital. DO Not wear nail polish, gel polish, artificial nails, or any other type of covering on natural nails including finger and toenails. If patients have artificial nails, gel coating, etc. that need to be removed by a nail salon, please have this removed prior to surgery or surgery may need to be canceled/delayed if the surgeon/ anesthesia feels like the patient is unable to be adequately monitored.             [facility] is not responsible for any belongings or valuables.  Do NOT Smoke (Tobacco/Vaping)  24 hours prior to your procedure  If you use a CPAP at night, you may bring your mask for your overnight stay.   Contacts, glasses, hearing aids, dentures or partials may not be worn into surgery, please bring cases for these belongings   For patients admitted to the hospital, discharge time will be determined by your treatment team.   Patients discharged the day of surgery will not be allowed to drive home, and someone needs to stay with them for 24 hours.  NO VISITORS WILL BE ALLOWED IN PRE-OP WHERE PATIENTS ARE PREPPED FOR SURGERY.  ONLY 1 SUPPORT PERSON MAY BE PRESENT IN THE WAITING ROOM WHILE YOU ARE IN SURGERY.  IF YOU ARE TO BE ADMITTED, ONCE YOU ARE IN YOUR ROOM YOU WILL BE ALLOWED TWO (2) VISITORS. 1 (ONE) VISITOR MAY STAY OVERNIGHT BUT MUST ARRIVE TO THE ROOM BY 8pm.  Minor children may have two parents present. Special consideration for safety and communication needs will be reviewed on a case by case basis.  Special  instructions:    Oral Hygiene is also important to reduce your risk of infection.  Remember - BRUSH YOUR TEETH THE MORNING OF SURGERY WITH YOUR REGULAR TOOTHPASTE   [facility]- Preparing For Surgery  Before surgery, you can play an important role. Because skin is not sterile, your skin needs to be as free of germs as possible. You can reduce the number of germs on your skin by washing with CHG (chlorahexidine gluconate) Soap before surgery.  CHG is an antiseptic cleaner which kills germs and bonds with the skin to continue killing germs even after washing.     Please do not use if you have an allergy to CHG or antibacterial soaps. If your skin becomes reddened/irritated stop using the CHG.  Do not shave (including legs and underarms) for at least 48 hours prior  to first CHG shower. It is OK to shave your face.  Please follow these instructions carefully.     Shower the NIGHT BEFORE SURGERY and the MORNING OF SURGERY with CHG Soap.   If you chose to wash your hair, wash your hair first as usual with your normal shampoo. After you shampoo, rinse your hair and body thoroughly to remove the shampoo.  Then [employer] and genitals (private parts) with your normal soap and rinse thoroughly to remove soap.  After that Use CHG Soap as you would any other liquid soap. You can apply CHG directly to the skin and wash gently with a scrungie or a clean washcloth.   Apply the CHG Soap to your body ONLY FROM THE NECK DOWN.  Do not use on open wounds or open sores. Avoid contact with your eyes, ears, mouth and genitals (private parts). Wash Face and genitals (private parts)  with your normal soap.   Wash thoroughly, paying special attention to the area where your surgery will be performed.  Thoroughly rinse your body with warm water from the neck down.  DO NOT shower/wash with your normal soap after using and rinsing off the CHG Soap.  Pat yourself dry with a CLEAN TOWEL.  Wear CLEAN PAJAMAS to bed the  night before surgery  Place CLEAN SHEETS on your bed the night before your surgery  DO NOT SLEEP WITH PETS.   Day of Surgery:  Take a shower with CHG soap. Wear Clean/Comfortable clothing the morning of surgery Do not apply any deodorants/lotions.   Remember to brush your teeth WITH YOUR REGULAR TOOTHPASTE.   Please read over the following fact sheets that you were given.

## 2021-11-23 NOTE — Progress Notes (Signed)
[facility] DEVICE PROGRAMMING  Patient Information: Name:  Jeffrey [name]  DOB:  04-15-1966  MRN:  [id number]    Planned Procedure:  Insertion of right barostim  Surgeon:  Dr. Trula Slade  Date of Procedure:  11/26/21  Cautery will be used.  Position during surgery:  supine   Please send documentation back to:  [facility] (Fax # [phone])  Device Information:  Clinic EP Physician:  Virl Axe, MD   Device Type:  Defibrillator Manufacturer and Phone #:  [employer]: [id number] Pacemaker Dependent?:  No. Date of Last Device Check:  11/09/21 Normal Device Function?:  Yes.    Electrophysiologist's Recommendations:  Have magnet available. Provide continuous ECG monitoring when magnet is used or reprogramming is to be performed.  Procedure will likely interfere with device function.  Device should be programmed:  Tachy therapies disabled  Per Device Clinic Standing Orders, Wanda Plump, RN  10:25 AM 11/23/2021

## 2021-11-24 ENCOUNTER — Encounter (HOSPITAL_COMMUNITY): Payer: Self-pay

## 2021-11-24 ENCOUNTER — Encounter (HOSPITAL_COMMUNITY)
Admission: RE | Admit: 2021-11-24 | Discharge: 2021-11-24 | Disposition: A | Discharge status: Home / Self Care | Payer: Medicare Other | Source: Ambulatory Visit | Attending: Surgery | Admitting: Surgery

## 2021-11-24 ENCOUNTER — Other Ambulatory Visit: Payer: Self-pay

## 2021-11-24 VITALS — BP 196/109 | HR 84 | Temp 98.5°F | Resp 19 | Ht 67.0 in | Wt 222.9 lb

## 2021-11-24 DIAGNOSIS — E1122 Type 2 diabetes mellitus with diabetic chronic kidney disease: Secondary | ICD-10-CM | POA: Insufficient documentation

## 2021-11-24 DIAGNOSIS — Z79899 Other long term (current) drug therapy: Secondary | ICD-10-CM | POA: Diagnosis not present

## 2021-11-24 DIAGNOSIS — E785 Hyperlipidemia, unspecified: Secondary | ICD-10-CM | POA: Diagnosis not present

## 2021-11-24 DIAGNOSIS — Z7982 Long term (current) use of aspirin: Secondary | ICD-10-CM | POA: Insufficient documentation

## 2021-11-24 DIAGNOSIS — N183 Chronic kidney disease, stage 3 unspecified: Secondary | ICD-10-CM | POA: Insufficient documentation

## 2021-11-24 DIAGNOSIS — Z01812 Encounter for preprocedural laboratory examination: Secondary | ICD-10-CM | POA: Diagnosis not present

## 2021-11-24 DIAGNOSIS — I5022 Chronic systolic (congestive) heart failure: Secondary | ICD-10-CM | POA: Diagnosis not present

## 2021-11-24 DIAGNOSIS — Z89411 Acquired absence of right great toe: Secondary | ICD-10-CM | POA: Insufficient documentation

## 2021-11-24 DIAGNOSIS — Z01818 Encounter for other preprocedural examination: Secondary | ICD-10-CM

## 2021-11-24 DIAGNOSIS — Z8616 Personal history of COVID-19: Secondary | ICD-10-CM | POA: Insufficient documentation

## 2021-11-24 DIAGNOSIS — I130 Hypertensive heart and chronic kidney disease with heart failure and stage 1 through stage 4 chronic kidney disease, or unspecified chronic kidney disease: Secondary | ICD-10-CM | POA: Insufficient documentation

## 2021-11-24 DIAGNOSIS — I2510 Atherosclerotic heart disease of native coronary artery without angina pectoris: Secondary | ICD-10-CM | POA: Diagnosis not present

## 2021-11-24 DIAGNOSIS — Z95810 Presence of automatic (implantable) cardiac defibrillator: Secondary | ICD-10-CM | POA: Diagnosis not present

## 2021-11-24 DIAGNOSIS — Z794 Long term (current) use of insulin: Secondary | ICD-10-CM | POA: Insufficient documentation

## 2021-11-24 LAB — COMPREHENSIVE METABOLIC PANEL
ALT: 19 U/L (ref 0–44)
AST: 23 U/L (ref 15–41)
Albumin: 3 g/dL — ABNORMAL LOW (ref 3.5–5.0)
Alkaline Phosphatase: 63 U/L (ref 38–126)
Anion gap: 7 (ref 5–15)
BUN: 25 mg/dL — ABNORMAL HIGH (ref 6–20)
CO2: 22 mmol/L (ref 22–32)
Calcium: 8.8 mg/dL — ABNORMAL LOW (ref 8.9–10.3)
Chloride: 107 mmol/L (ref 98–111)
Creatinine, Ser: 2.04 mg/dL — ABNORMAL HIGH (ref 0.61–1.24)
GFR, Estimated: 38 mL/min — ABNORMAL LOW (ref >60)
Glucose, Bld: 124 mg/dL — ABNORMAL HIGH (ref 70–99)
Potassium: 3.6 mmol/L (ref 3.5–5.1)
Sodium: 136 mmol/L (ref 135–145)
Total Bilirubin: 0.7 mg/dL (ref 0.3–1.2)
Total Protein: 6.7 g/dL (ref 6.5–8.1)

## 2021-11-24 LAB — PROTIME-INR
INR: 1 (ref 0.8–1.2)
Prothrombin Time: 12.9 seconds (ref 11.4–15.2)

## 2021-11-24 LAB — CBC
HCT: 37.3 % — ABNORMAL LOW (ref 39.0–52.0)
Hemoglobin: 11.4 g/dL — ABNORMAL LOW (ref 13.0–17.0)
MCH: 25.1 pg — ABNORMAL LOW (ref 26.0–34.0)
MCHC: 30.6 g/dL (ref 30.0–36.0)
MCV: 82 fL (ref 80.0–100.0)
Platelets: 271 10*3/uL (ref 150–400)
RBC: 4.55 MIL/uL (ref 4.22–5.81)
RDW: 14.6 % (ref 11.5–15.5)
WBC: 5.1 10*3/uL (ref 4.0–10.5)
nRBC: 0 % (ref 0.0–0.2)

## 2021-11-24 LAB — URINALYSIS, ROUTINE W REFLEX MICROSCOPIC
Bilirubin Urine: NEGATIVE
Glucose, UA: NEGATIVE mg/dL
Ketones, ur: NEGATIVE mg/dL
Leukocytes,Ua: NEGATIVE
Nitrite: NEGATIVE
Protein, ur: >300 mg/dL — AB
Specific Gravity, Urine: 1.02 (ref 1.005–1.030)
pH: 6.5 (ref 5.0–8.0)

## 2021-11-24 LAB — TYPE AND SCREEN
ABO/RH(D): O POS
Antibody Screen: NEGATIVE

## 2021-11-24 LAB — SURGICAL PCR SCREEN
MRSA, PCR: NEGATIVE
Staphylococcus aureus: NEGATIVE

## 2021-11-24 LAB — URINALYSIS, MICROSCOPIC (REFLEX)
Bacteria, UA: NONE SEEN
Squamous Epithelial / HPF: NONE SEEN (ref 0–5)

## 2021-11-24 LAB — APTT: aPTT: 35 seconds (ref 24–36)

## 2021-11-24 LAB — GLUCOSE, CAPILLARY
Glucose-Capillary: 48 mg/dL — ABNORMAL LOW (ref 70–99)
Glucose-Capillary: 101 mg/dL — ABNORMAL HIGH (ref 70–99)

## 2021-11-24 NOTE — Progress Notes (Addendum)
[place] Mr Stencel to find out if he was still coming to his 10am appt. He stated he was on the way and would be here in 5 minutes that his friend was sick and his daughter was giving him a ride.   1105  Patient arrived to PAT for his appt.    PCP - Dr. Charlott Rakes Cardiologist - Dr. Acie Fredrickson  PPM/ICD - ICD Device Orders - Received and in chart Rep Notified - [facility] emailed on 11/23/21  Chest x-ray - Not indicated today last one on 06/30/21 EKG - Not indicated today last one on 08/17/21 Stress Test - 10/04/11 CE ECHO - 07/01/21 Cardiac Cath - 01/31/17  Sleep Study - Yes unsure if diagnosed with OSA  CPAP - No  DM - Type II CBG at PAT appt fasting 48 was not thinking clearly 1141 Recheck on CBG 101 Checks Blood Sugar __daily last check 2 days ago 154  Aspirin Instructions: Aspirin per [facility] will continue.  COVID TEST-  Not indicated   Anesthesia review: Yes extensive cardiac history  Patient denies shortness of breath, fever, cough and chest pain at PAT appointment   All instructions explained to the patient, with a verbal understanding of the material. Patient agrees to go over the instructions while at home for a better understanding.  The opportunity to ask questions was provided.

## 2021-11-24 NOTE — Progress Notes (Signed)
°   11/24/21 1130  OBSTRUCTIVE SLEEP APNEA  Have you ever been diagnosed with sleep apnea through a sleep study? Yes  If yes, do you have and use a CPAP or BPAP machine every night? 0  Do you snore loudly (loud enough to be heard through closed doors)?  0  Do you often feel tired, fatigued, or sleepy during the daytime (such as falling asleep during driving or talking to someone)? 0  Has anyone observed you stop breathing during your sleep? 1  Do you have, or are you being treated for high blood pressure? 1  Age > 50 (1-yes) 1  Neck circumference greater than:Male 16 inches or larger, Male 17inches or larger? 1  Male Gender (Yes=1) 1  Obstructive Sleep Apnea Score 5

## 2021-11-25 NOTE — Progress Notes (Signed)
Anesthesia Chart Review:  Case: [id number] Date/Time: 11/26/21 1149   Procedure: INSERTION OF [name] (Right)   Anesthesia type: General   Pre-op diagnosis: CHF   Location: [facility] OR ROOM 39 / [place] OR   Surgeons: Serafina Mitchell, MD       DISCUSSION: Patient is a 55 year old male scheduled for the above procedure. He had left eye pars plana vitrectomy scheduled for 08/03/21 but canceled prior to arrival due to home hypoglycemic event prompting ED evaluation and then admission a few days later with additional insulin adjustments. That surgery was rescheduled at least three times since, but eye surgery never happened at [facility] for unclear reasons. He did have another ED visit for hypoglycemia on 10/07/21 in the setting of not eating after taking insulin. He was then evaluated by Dr. Trula Slade on 10/11/21 for Barostim evaluation per EP since her HF failed to improved despite optimization of guideline directed medical therapy.    History includes never smoker, DM2, HTN, HLD, CAD (non-obstructive, 8588), chronic systolic CHF (diagnosed 5027), nonischemic cardiomyopathy, ICD (Medtronic [id number] Visia AF MRI VR ICD 01/15/18), dyspnea, CKD (stage 3), anemia, diabetic foot ulcer (s/p right great and 2nd toe amputations 2017), eye surgery (right para plan vitrectomy 07/14/20; repair hemorrhagic detached right retina 08/25/20), back surgery.  Positive COVID-19 12/03/2019 & 12/21/20.  Reported he is scheduled for sleep study but does not currently use any O2 or CPAP for sleep.   - Admission to [facility] 08/06/21-08/07/21 for hypoglycemia in setting of worsening renal function. Medications adjusted.   - Admission to [facility] 06/30/21-07/04/21 for hyperosmolar hyperglycemic state with seizure-like activity believed secondary to hyperglycemia and hypokalemia. Reportedly seizure in setting of hyperglycemia years ago as well. In the ED he was noted to have significant electrolyte abnormalities with a glucose > 700 and his potassium was  2.7.  BP 201/104.  He has not been compliant with his medications or diet recently.  He was treated with potassium, insulin, D5 LR, NS, MgSO4.  A1c 14.2 %, Lantus and SSI resumed. EEG normal. 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 No evidence of mass effect or hemorrhage. Neurology consulted. Echo showed no thrombus and MRA head/neck showed no significant stenosis or occlusions.  Seizures felt provoked, so no anti-seizure medication recommended. Acute/subacute infarcts felt likely due to intracellular dehydration from severe hyperglycemia. ASA/Plavix for 3 weeks followed by ASA alone recommended and well as aggressive risk factor modification. Cardiology also consulted for CHF and uncontrolled hypertension.  Not felt to be significantly volume overloaded on exam.  Home BP medications resumed although Entresto and spironolactone held for acute on chronic kidney disease.  Echo showed stable LVEF 30 to 35%.    Last EP evaluation by Tommye Standard, PA-C. She is aware of plans for Barostim. Device function was stable.   Perioperative cardiac device Rx: Device Information: Clinic EP Physician:  Virl Axe, MD  Device Type:  Defibrillator Manufacturer and Phone #:  Medtronic: [phone] Pacemaker Dependent?:  No. Date of Last Device Check:  11/09/21         Normal Device Function?:  Yes.     Electrophysiologist's Recommendations: Have magnet available. Provide continuous ECG monitoring when magnet is used or reprogramming is to be performed.  Procedure will likely interfere with device  function.  Device should be programmed:  Tachy therapies disabled    Last saw PCP Dr. Margarita Rana on 10/20/21 for DM follow-up. ED visit 10/07/21 for hypoglycemia after not  eating after taking inuslin. BP elevated, but had also not taken his medications. No further hypoglycemic episodes, so she did not change his DM regimen. Last A1c 7.6%. Of note, PAT RN notes the CBG was 48 on arrival, documented as "fasting". He as given CHO with recheck CBG 101. She spoke with another anesthesia APP about this. He has instructions on "How do I manage my blood sugar before surgery?". He will get a CBG on arrival  Anesthesia team to evaluate on the day of surgery.   VS: BP (!) 196/109    Pulse 84    Temp 36.9 C (Oral)    Resp 19    Ht '5\' 7"'  (1.702 m)    Wt 101.1 kg    SpO2 100%    BMI 34.91 kg/m  I don't see the BP was rechecked at his PAT visit. Last two readings were more reasonable 130-160/70-89. BP Readings from Last 3 Encounters:  11/24/21 (!) 196/109  11/09/21 (!) 160/70  10/20/21 130/89     PROVIDERS: Charlott Rakes, MD is PCP. Last visit 10/20/21. She referred him to nephrology for CKD/diabetic nephropathy.  She did increased in Lasix and referred him back to cardiology. Virl Axe, MD is EP cardiologist. Last visit 11/09/21 with Tommye Standard, PA-C. She noted plans for Barostim.  His device was interrogated and battery and lead measurements were stable, no arrhythmias. Nahser, Arnette Norris, MD is primary cardiologist but is primarily followed by EP   LABS: Labs reviewed: Acceptable for surgery. His CBG was 48 on arrival, but up to 101 after CHO and 124 for BMET. A1c 7.6% 10/20/21.  (all labs ordered are listed, but only abnormal results are displayed)  Labs Reviewed  GLUCOSE, CAPILLARY - Abnormal; Notable for the following components:      Result Value   Glucose-Capillary 48 (*)    All other components within normal limits  GLUCOSE, CAPILLARY - Abnormal; Notable for the following components:   Glucose-Capillary 101 (*)    All other components within normal limits  CBC - Abnormal; Notable for the following components:   Hemoglobin 11.4 (*)    HCT 37.3 (*)    MCH  25.1 (*)    All other components within normal limits  COMPREHENSIVE METABOLIC PANEL - Abnormal; Notable for the following components:   Glucose, Bld 124 (*)    BUN 25 (*)    Creatinine, Ser 2.04 (*)    Calcium 8.8 (*)    Albumin 3.0 (*)    GFR, Estimated 38 (*)    All other components within normal limits  URINALYSIS, ROUTINE W REFLEX MICROSCOPIC - Abnormal; Notable for the following components:   Hgb urine dipstick MODERATE (*)    Protein, ur >300 (*)    All other components within normal limits  SURGICAL PCR SCREEN  PROTIME-INR  APTT  URINALYSIS, MICROSCOPIC (REFLEX)  TYPE AND SCREEN     OTHER: EEG 07/01/21: IMPRESSION: This study is within normal limits. No seizures or epileptiform discharges were seen throughout the recording.     IMAGES: MRA Head 07/02/21: IMPRESSION: Negative head MRA.   MRA Neck 07/02/21: IMPRESSION: 1. Study limitations as detailed above [see full report] primarily affecting assessment of the proximal portions of the common carotid and vertebral arteries. 2. No evidence of significant cervical carotid or vertebral artery stenosis or  dissection where the vessels were adequately visualized.   1V PCXR 06/30/21: FINDINGS: - The heart size and mediastinal contours are unchanged. Left chest wall single lead cardiac device in stable position. - Persistent left base atelectasis with minimally elevated left hemidiaphragm. No focal consolidation. No pulmonary edema. No pleural effusion. No pneumothorax. - No acute osseous abnormality. IMPRESSION: No active disease.     EKG: 79/2/22: Sinus rhythm Nonspecific T abnormalities, lateral leads Borderline prolonged QT interval No significant change since last tracing Confirmed by Dorie Rank [id number]) on 08/13/2021 6:30:09 PM    CV: Echo 07/01/21: IMPRESSIONS   1. Left ventricular ejection fraction, by estimation, is 30 to 35%. The  left ventricle has moderately decreased function. The left ventricle   demonstrates global hypokinesis. Left ventricular diastolic parameters are  consistent with Grade I diastolic  dysfunction (impaired relaxation).   2. Right ventricular systolic function is normal. The right ventricular  size is normal. There is normal pulmonary artery systolic pressure.   3. The mitral valve is grossly normal. No evidence of mitral valve  regurgitation. No evidence of mitral stenosis.   4. The aortic valve was not well visualized. There is mild thickening of  the aortic valve. Aortic valve regurgitation is not visualized. Mild  aortic valve sclerosis is present, with no evidence of aortic valve  stenosis.   5. The inferior vena cava is normal in size with greater than 50%  respiratory variability, suggesting right atrial pressure of 3 mmHg.  (Comparison: LVEF 20-25% 01/29/17 echo; 10-15% 01/31/17 cath; LVEF 20% dffuse hypokinesis, grade 2 DD, RV systolic functio nmoderately-severely reduced 01/14/18 echo; LVEF 30-35%, normal RVSF 06/13/20 echo; LVEF 30-35%, grade 2 DD, normal RVSF 03/30/21 echo)     US Carotid 03/30/21: Summary:  Right Carotid: Velocities in the right ICA are consistent with a 1-39% stenosis.  Left Carotid: Velocities in the left ICA are consistent with a 1-39% stenosis.  Vertebrals:  Bilateral vertebral arteries demonstrate antegrade flow.  Subclavians: Normal flow hemodynamics were seen in bilateral subclavian arteries.      Cardiac cath 01/31/17: 1st RPLB lesion, 60 %stenosed. Dist RCA lesion, 60 %stenosed. Prox RCA lesion, 55 %stenosed. Prox LAD lesion, 10 %stenosed. There is severe left ventricular systolic dysfunction. LV end diastolic pressure is mildly elevated. - Severe global LV dysfunction with an ejection fraction of 10-15%.  The pattern is one of a nonischemic cardiomyopathy. - Mild coronary obstructive disease with smooth 10% narrowing in the LAD; normal ramus intermediate, normal left circumflex; and RCA with 50-60% proximal stenosis and  distal tapering of 60%.  Prior to giving rise to 3 small distal branches with 60% narrowing in a small inferior LV branch.  The patient's LV dysfunction is out of proportion to his CAD. - Initiation of medical therapy with carvedilol, spironolactone, and probable initiation of angiotension receptorblocker/neprilysin inhibition therapy.  Consider short-term life-vest prior to discharge to allow for possible medication induced improvement in LV function. [S/p ICD 01/15/18]   Past Medical History:  Diagnosis Date   AICD (automatic cardioverter/defibrillator) present    Medtronic   AICD (automatic cardioverter/defibrillator) present    MDT Visia AF MRI   Anemia    CAD (coronary artery disease)    a. cath 01/31/17: 60% 1st RPLB, 60% dist RCA, 55% prox RCA, 10% pro LAD --> Rx TX.    CHF (congestive heart failure) (HCC)    Chronic systolic CHF (congestive heart failure) ([place]) 01/28/2017   1. Echo 01/29/17:  EF 20-25, normal wall motion, mild  LAE // 2. EF 10-15 by [facility] 01/2017    Coronary artery disease    Diabetes mellitus    type II   Diabetes mellitus without complication ([place])    Diabetic foot infection ([place]) 03/2016   RT FOOT   Dyspnea    History of kidney stones    passed   History of kidney stones    HTN (hypertension)    Hyperlipidemia    Hypertension    Myocardial infarction [facility]), although reported, NICM at cath    NICM (nonischemic cardiomyopathy) ([place]) 02/15/2017   1. Mod non-obs CAD on LHC in 01/2017 - CAD does not explain cardiomyopathy   Renal disorder     Past Surgical History:  Procedure Laterality Date   AIR/FLUID EXCHANGE Right 07/14/2020   Procedure: AIR/FLUID EXCHANGE;  Surgeon: Jalene Mullet, MD;  Location: [place];  Service: Ophthalmology;  Laterality: Right;   AMPUTATION Right 04/01/2016   Procedure: Right Great Toe Amputation;  Surgeon: Newt Minion, MD;  Location: [place];  Service: Orthopedics;  Laterality: Right;   AMPUTATION Right 06/19/2016   Procedure: AMPUTATION  SECOND TOE;  Surgeon: Marybelle Killings, MD;  Location: [place];  Service: Orthopedics;  Laterality: Right;   BACK SURGERY     for abscess   CARDIAC CATHETERIZATION     ICD IMPLANT N/A 01/15/2018   Procedure: ICD IMPLANT;  Surgeon: Deboraha Sprang, MD;  Location: [place] CV LAB;  Service: Cardiovascular;  Laterality: N/A;   INJECTION OF SILICONE OIL Right 01/13/253   Procedure: INJECTION OF SILICONE OIL;  Surgeon: Jalene Mullet, MD;  Location: [place];  Service: Ophthalmology;  Laterality: Right;   INJECTION OF SILICONE OIL Right 2/70/6237   Procedure: INJECTION OF SILICONE OIL;  Surgeon: Jalene Mullet, MD;  Location: [place];  Service: Ophthalmology;  Laterality: Right;   INSERT / REPLACE / REMOVE PACEMAKER     MEMBRANE PEEL Right 08/25/2020   Procedure: MEMBRANE PEEL;  Surgeon: Jalene Mullet, MD;  Location: [place];  Service: Ophthalmology;  Laterality: Right;   PARS PLANA VITRECTOMY Right 07/14/2020   Procedure: PARS PLANA VITRECTOMY WITH 25 GAUGE, Membranetomy, drainage of subretinal fluid;  Surgeon: Jalene Mullet, MD;  Location: [place];  Service: Ophthalmology;  Laterality: Right;   PARS PLANA VITRECTOMY Right 08/25/2020   Procedure: PARS PLANA VITRECTOMY WITH 25 GAUGE;  Surgeon: Jalene Mullet, MD;  Location: [place];  Service: Ophthalmology;  Laterality: Right;   PHOTOCOAGULATION WITH LASER Right 07/14/2020   Procedure: PHOTOCOAGULATION WITH LASER;  Surgeon: Jalene Mullet, MD;  Location: [place];  Service: Ophthalmology;  Laterality: Right;   PHOTOCOAGULATION WITH LASER Right 08/25/2020   Procedure: PHOTOCOAGULATION WITH LASER;  Surgeon: Jalene Mullet, MD;  Location: [place];  Service: Ophthalmology;  Laterality: Right;   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   Procedure: REPAIR OF HEMORRHAGIC DETACHMENT;  Surgeon: Jalene Mullet, MD;  Location: [place];  Service: Ophthalmology;  Laterality: Right;   RIGHT/LEFT HEART CATH AND CORONARY ANGIOGRAPHY N/A 01/31/2017   Procedure:  Right/Left Heart Cath and Coronary Angiography;  Surgeon: Troy Sine, MD;  Location: [place] CV LAB;  Service: Cardiovascular;  Laterality: N/A;   SILICON OIL REMOVAL Right 06/08/3150   Procedure: SILICON OIL REMOVAL;  Surgeon: Jalene Mullet, MD;  Location: [place];  Service: Ophthalmology;  Laterality: Right;    MEDICATIONS:  Accu-Chek Softclix Lancets lancets   albuterol (VENTOLIN HFA) 108 (90 Base) MCG/ACT inhaler   aspirin 81 MG EC tablet   Blood Glucose  Monitoring Suppl (ACCU-CHEK GUIDE) w/Device KIT   carvedilol (COREG) 12.5 MG tablet   cetirizine (ZYRTEC) 10 MG tablet   Elastic Bandages & Supports (T.E.D. KNEE LENGTH/S-LONG) MISC   ferrous sulfate 325 (65 FE) MG tablet   furosemide (LASIX) 40 MG tablet   gabapentin (NEURONTIN) 300 MG capsule   glucose blood (ACCU-CHEK GUIDE) test strip   hydrALAZINE (APRESOLINE) 25 MG tablet   insulin aspart (NOVOLOG FLEXPEN) 100 UNIT/ML FlexPen   Insulin Pen Needle (BD PEN NEEDLE NANO U/F) 32G X 4 MM MISC   isosorbide mononitrate (IMDUR) 30 MG 24 hr tablet   LANTUS SOLOSTAR 100 UNIT/ML Solostar Pen   magnesium oxide (MAG-OX) 400 MG tablet   ondansetron (ZOFRAN) 4 MG tablet   potassium chloride SA (KLOR-CON) 20 MEQ tablet   rosuvastatin (CRESTOR) 40 MG tablet   tamsulosin (FLOMAX) 0.4 MG CAPS capsule   No current facility-administered medications for this encounter.    Myra Gianotti, PA-C Surgical Short Stay/Anesthesiology [facility] Phone [phone] [facility] Phone [phone] 11/25/2021 11:59 AM

## 2021-11-25 NOTE — Anesthesia Preprocedure Evaluation (Addendum)
Anesthesia Evaluation  Patient identified by MRN, date of birth, ID band Patient awake    Reviewed: Allergy & Precautions, NPO status , [name] Chart, lab work & pertinent test results, reviewed documented beta blocker date and time   Airway Mallampati: III  TM Distance: >3 FB Neck ROM: Full    Dental  (+) Missing, Dental Advisory Given,    Pulmonary shortness of breath and with exertion,    Pulmonary exam normal breath sounds clear to auscultation       Cardiovascular hypertension, Pt. on medications + CAD, + Past MI and +CHF  Normal cardiovascular exam+ Cardiac Defibrillator  Rhythm:Regular Rate:Normal  Cardiac Cath 01/31/17: 60% 1st RPLB, 60% dist RCA, 55% prox RCA, 10% pro LAD   Echo 07/01/21 1. Left ventricular ejection fraction, by estimation, is 30 to 35%. The left ventricle has moderately decreased function. The left ventricle demonstrates global hypokinesis. Left ventricular diastolic parameters are consistent with Grade I diastolic dysfunction (impaired relaxation).  2. Right ventricular systolic function is normal. The right ventricular size is normal. There is normal pulmonary artery systolic pressure.  3. The mitral valve is grossly normal. No evidence of mitral valve regurgitation. No evidence of mitral stenosis.  4. The aortic valve was not well visualized. There is mild thickening of  the aortic valve. Aortic valve regurgitation is not visualized. Mild aortic valve sclerosis is present, with no evidence of aortic valve stenosis.  5. The inferior vena cava is normal in size with greater than 50% respiratory variability, suggesting right atrial pressure of 3 mmHg.   EKG 08/13/2021 NSR, non specific lateral ST-T wave abnormalities   Neuro/Psych Seizures -, Well Controlled,  Diabetic neuropath CVA, No Residual Symptoms negative psych ROS   GI/Hepatic negative GI ROS, Neg liver ROS,   Endo/Other  diabetes, Poorly  Controlled, Type 2, Insulin DependentHyperlipidemia Obesity  Renal/GU Renal InsufficiencyRenal disease  negative genitourinary   Musculoskeletal negative musculoskeletal ROS (+)   Abdominal (+) + obese,   Peds  Hematology  (+) anemia ,   Anesthesia Other Findings   Reproductive/Obstetrics                           Anesthesia Physical Anesthesia Plan  ASA: 3  Anesthesia Plan: General   Post-op Pain Management:    Induction: Intravenous  PONV Risk Score and Plan: 3 and Treatment may vary due to age or medical condition  Airway Management Planned: Oral ETT  Additional Equipment: Arterial line  Intra-op Plan:   Post-operative Plan: Extubation in OR  Informed Consent: I have reviewed the patients History and Physical, chart, labs and discussed the procedure including the risks, benefits and alternatives for the proposed anesthesia with the patient or authorized representative who has indicated his/her understanding and acceptance.     Dental advisory given  Plan Discussed with: CRNA and Anesthesiologist  Anesthesia Plan Comments: (PAT note written 11/25/2021 by Myra Gianotti, PA-C. )       Anesthesia Quick Evaluation

## 2021-11-26 ENCOUNTER — Ambulatory Visit (HOSPITAL_COMMUNITY): Payer: Medicare Other | Admitting: Physician Assistant

## 2021-11-26 ENCOUNTER — Ambulatory Visit (HOSPITAL_COMMUNITY): Payer: Medicare Other | Admitting: Anesthesiology

## 2021-11-26 ENCOUNTER — Other Ambulatory Visit: Payer: Self-pay

## 2021-11-26 ENCOUNTER — Other Ambulatory Visit (HOSPITAL_COMMUNITY): Payer: Self-pay

## 2021-11-26 ENCOUNTER — Encounter (HOSPITAL_COMMUNITY)
Admission: RE | Disposition: A | Discharge status: Home / Self Care | Payer: Self-pay | Source: Home / Self Care | Attending: Surgery

## 2021-11-26 ENCOUNTER — Encounter (HOSPITAL_COMMUNITY): Payer: Self-pay | Admitting: Surgery

## 2021-11-26 ENCOUNTER — Ambulatory Visit (HOSPITAL_COMMUNITY)
Admission: RE | Admit: 2021-11-26 | Discharge: 2021-11-26 | Disposition: A | Discharge status: Home / Self Care | Payer: Medicare Other | Attending: Surgery | Admitting: Surgery

## 2021-11-26 DIAGNOSIS — E785 Hyperlipidemia, unspecified: Secondary | ICD-10-CM | POA: Diagnosis not present

## 2021-11-26 DIAGNOSIS — Z79899 Other long term (current) drug therapy: Secondary | ICD-10-CM | POA: Insufficient documentation

## 2021-11-26 DIAGNOSIS — Z95810 Presence of automatic (implantable) cardiac defibrillator: Secondary | ICD-10-CM | POA: Diagnosis not present

## 2021-11-26 DIAGNOSIS — E669 Obesity, unspecified: Secondary | ICD-10-CM | POA: Diagnosis not present

## 2021-11-26 DIAGNOSIS — I110 Hypertensive heart disease with heart failure: Secondary | ICD-10-CM | POA: Insufficient documentation

## 2021-11-26 DIAGNOSIS — I2510 Atherosclerotic heart disease of native coronary artery without angina pectoris: Secondary | ICD-10-CM | POA: Diagnosis not present

## 2021-11-26 DIAGNOSIS — I509 Heart failure, unspecified: Secondary | ICD-10-CM | POA: Diagnosis not present

## 2021-11-26 DIAGNOSIS — E7800 Pure hypercholesterolemia, unspecified: Secondary | ICD-10-CM | POA: Diagnosis not present

## 2021-11-26 DIAGNOSIS — Z794 Long term (current) use of insulin: Secondary | ICD-10-CM | POA: Insufficient documentation

## 2021-11-26 DIAGNOSIS — Z006 Encounter for examination for normal comparison and control in clinical research program: Secondary | ICD-10-CM

## 2021-11-26 DIAGNOSIS — G40909 Epilepsy, unspecified, not intractable, without status epilepticus: Secondary | ICD-10-CM | POA: Insufficient documentation

## 2021-11-26 DIAGNOSIS — I252 Old myocardial infarction: Secondary | ICD-10-CM | POA: Diagnosis not present

## 2021-11-26 DIAGNOSIS — I5022 Chronic systolic (congestive) heart failure: Secondary | ICD-10-CM | POA: Insufficient documentation

## 2021-11-26 DIAGNOSIS — E1165 Type 2 diabetes mellitus with hyperglycemia: Secondary | ICD-10-CM | POA: Insufficient documentation

## 2021-11-26 DIAGNOSIS — I5089 Other heart failure: Secondary | ICD-10-CM | POA: Diagnosis present

## 2021-11-26 LAB — GLUCOSE, CAPILLARY
Glucose-Capillary: 129 mg/dL — ABNORMAL HIGH (ref 70–99)
Glucose-Capillary: 170 mg/dL — ABNORMAL HIGH (ref 70–99)

## 2021-11-26 LAB — ABO/RH: ABO/RH(D): O POS

## 2021-11-26 SURGERY — INSERTION, CAROTID SINUS BAROREFLEX ACTIVATION DEVICE
Anesthesia: General | Site: Chest | Laterality: Right

## 2021-11-26 MED ORDER — ROCURONIUM BROMIDE 10 MG/ML (PF) SYRINGE
PREFILLED_SYRINGE | INTRAVENOUS | Status: AC
  Filled 2021-11-26: qty 10

## 2021-11-26 MED ORDER — ROCURONIUM BROMIDE 10 MG/ML (PF) SYRINGE
PREFILLED_SYRINGE | INTRAVENOUS | Status: DC | PRN
  Administered 2021-11-26: 100 mg via INTRAVENOUS

## 2021-11-26 MED ORDER — SODIUM CHLORIDE 0.9 % IV SOLN
0.1500 ug/kg/min | INTRAVENOUS | Status: DC
  Filled 2021-11-26: qty 2000

## 2021-11-26 MED ORDER — LIDOCAINE 2% (20 MG/ML) 5 ML SYRINGE
INTRAMUSCULAR | Status: DC | PRN
  Administered 2021-11-26: 60 mg via INTRAVENOUS

## 2021-11-26 MED ORDER — OXYCODONE HCL 5 MG PO TABS
5.0000 mg | ORAL_TABLET | Freq: Once | ORAL | Status: AC | PRN
  Administered 2021-11-26: 5 mg via ORAL

## 2021-11-26 MED ORDER — LIDOCAINE 2% (20 MG/ML) 5 ML SYRINGE
INTRAMUSCULAR | Status: AC
  Filled 2021-11-26: qty 5

## 2021-11-26 MED ORDER — MIDAZOLAM HCL 5 MG/5ML IJ SOLN
INTRAMUSCULAR | Status: DC | PRN
  Administered 2021-11-26: 2 mg via INTRAVENOUS
  Administered 2021-11-26: 1 mg via INTRAVENOUS

## 2021-11-26 MED ORDER — CHLORHEXIDINE GLUCONATE CLOTH 2 % EX PADS: 6.0000 | MEDICATED_PAD | Freq: Once | CUTANEOUS | Status: DC

## 2021-11-26 MED ORDER — NOREPINEPHRINE 4 MG/250ML-% IV SOLN: INTRAVENOUS | Status: DC | PRN

## 2021-11-26 MED ORDER — OXYCODONE-ACETAMINOPHEN 5-325 MG PO TABS
1.0000 | ORAL_TABLET | Freq: Four times a day (QID) | ORAL | 0 refills | Status: DC | PRN
  Filled 2021-11-26: qty 20, 5d supply, fill #0

## 2021-11-26 MED ORDER — FENTANYL CITRATE (PF) 100 MCG/2ML IJ SOLN
25.0000 ug | INTRAMUSCULAR | Status: DC | PRN
  Administered 2021-11-26 (×2): 50 ug via INTRAVENOUS

## 2021-11-26 MED ORDER — ETOMIDATE 2 MG/ML IV SOLN
INTRAVENOUS | Status: DC | PRN
  Administered 2021-11-26: 20 mg via INTRAVENOUS

## 2021-11-26 MED ORDER — MIDAZOLAM HCL 2 MG/2ML IJ SOLN
INTRAMUSCULAR | Status: AC
  Filled 2021-11-26: qty 2

## 2021-11-26 MED ORDER — SODIUM CHLORIDE 0.9 % IV SOLN: INTRAVENOUS | Status: DC

## 2021-11-26 MED ORDER — OXYCODONE HCL 5 MG/5ML PO SOLN: 5.0000 mg | Freq: Once | ORAL | Status: AC | PRN

## 2021-11-26 MED ORDER — DEXAMETHASONE SODIUM PHOSPHATE 10 MG/ML IJ SOLN
INTRAMUSCULAR | Status: DC | PRN
  Administered 2021-11-26: 4 mg via INTRAVENOUS

## 2021-11-26 MED ORDER — SODIUM CHLORIDE 0.9 % IV SOLN
INTRAVENOUS | Status: DC | PRN
  Administered 2021-11-26: 500 mL

## 2021-11-26 MED ORDER — DEXAMETHASONE SODIUM PHOSPHATE 10 MG/ML IJ SOLN
INTRAMUSCULAR | Status: AC
  Filled 2021-11-26: qty 1

## 2021-11-26 MED ORDER — CHLORHEXIDINE GLUCONATE 0.12 % MT SOLN: 15.0000 mL | Freq: Once | OROMUCOSAL | Status: AC

## 2021-11-26 MED ORDER — SODIUM CHLORIDE 0.9 % IV SOLN
0.1500 ug/kg/min | INTRAVENOUS | Status: AC
  Administered 2021-11-26: .15 ug/kg/min via INTRAVENOUS
  Administered 2021-11-26: .1 ug/kg/min via INTRAVENOUS
  Filled 2021-11-26: qty 2000

## 2021-11-26 MED ORDER — ORAL CARE MOUTH RINSE: 15.0000 mL | Freq: Once | OROMUCOSAL | Status: AC

## 2021-11-26 MED ORDER — ETOMIDATE 2 MG/ML IV SOLN
INTRAVENOUS | Status: AC
  Filled 2021-11-26: qty 10

## 2021-11-26 MED ORDER — CEFAZOLIN SODIUM-DEXTROSE 2-4 GM/100ML-% IV SOLN
INTRAVENOUS | Status: AC
  Filled 2021-11-26: qty 100

## 2021-11-26 MED ORDER — 0.9 % SODIUM CHLORIDE (POUR BTL) OPTIME
TOPICAL | Status: DC | PRN
  Administered 2021-11-26: 500 mL

## 2021-11-26 MED ORDER — ONDANSETRON HCL 4 MG/2ML IJ SOLN
INTRAMUSCULAR | Status: AC
  Filled 2021-11-26: qty 2

## 2021-11-26 MED ORDER — CHLORHEXIDINE GLUCONATE 0.12 % MT SOLN
OROMUCOSAL | Status: AC
  Administered 2021-11-26: 15 mL via OROMUCOSAL
  Filled 2021-11-26: qty 15

## 2021-11-26 MED ORDER — FENTANYL CITRATE (PF) 250 MCG/5ML IJ SOLN
INTRAMUSCULAR | Status: DC | PRN
  Administered 2021-11-26: 100 ug via INTRAVENOUS

## 2021-11-26 MED ORDER — PROPOFOL 1000 MG/100ML IV EMUL
INTRAVENOUS | Status: AC
  Filled 2021-11-26: qty 100

## 2021-11-26 MED ORDER — CEFAZOLIN SODIUM-DEXTROSE 2-4 GM/100ML-% IV SOLN
2.0000 g | INTRAVENOUS | Status: AC
  Administered 2021-11-26: 2 g via INTRAVENOUS

## 2021-11-26 MED ORDER — FENTANYL CITRATE (PF) 250 MCG/5ML IJ SOLN
INTRAMUSCULAR | Status: AC
  Filled 2021-11-26: qty 5

## 2021-11-26 MED ORDER — ONDANSETRON HCL 4 MG/2ML IJ SOLN
INTRAMUSCULAR | Status: DC | PRN
  Administered 2021-11-26: 4 mg via INTRAVENOUS

## 2021-11-26 MED ORDER — FENTANYL CITRATE (PF) 100 MCG/2ML IJ SOLN
INTRAMUSCULAR | Status: AC
  Filled 2021-11-26: qty 2

## 2021-11-26 MED ORDER — NOREPINEPHRINE 4 MG/250ML-% IV SOLN
INTRAVENOUS | Status: DC | PRN
  Administered 2021-11-26: 8 ug/min via INTRAVENOUS

## 2021-11-26 MED ORDER — OXYCODONE HCL 5 MG PO TABS
ORAL_TABLET | ORAL | Status: AC
  Filled 2021-11-26: qty 1

## 2021-11-26 SURGICAL SUPPLY — 43 items
ADH SKN CLS APL DERMABOND .7 (GAUZE/BANDAGES/DRESSINGS) ×2
APL PRP STRL LF DISP 70% ISPRP (MISCELLANEOUS) ×1
BAG COUNTER SPONGE SURGICOUNT (BAG) ×3 IMPLANT
BAG SPNG CNTER NS LX DISP (BAG) ×1
BAG SURGICOUNT SPONGE COUNTING (BAG) ×1
CANISTER SUCT 3000ML PPV (MISCELLANEOUS) ×4 IMPLANT
CHLORAPREP W/TINT 26 (MISCELLANEOUS) ×4 IMPLANT
CLIP VESOCCLUDE MED 6/CT (CLIP) IMPLANT
CLIP VESOCCLUDE SM WIDE 6/CT (CLIP) IMPLANT
DERMABOND ADVANCED (GAUZE/BANDAGES/DRESSINGS) ×4
DERMABOND ADVANCED .7 DNX12 (GAUZE/BANDAGES/DRESSINGS) ×2 IMPLANT
ELECT REM PT RETURN 9FT ADLT (ELECTROSURGICAL) ×3
ELECTRODE REM PT RTRN 9FT ADLT (ELECTROSURGICAL) ×2 IMPLANT
GENERATOR IPG BAROSTIM 2104 (Generator) ×2 IMPLANT
GLOVE SRG 8 PF TXTR STRL LF DI (GLOVE) ×2 IMPLANT
GLOVE SURG POLYISO LF SZ7.5 (GLOVE) ×4 IMPLANT
GLOVE SURG UNDER POLY LF SZ8 (GLOVE) ×3
GOWN STRL REUS W/ TWL LRG LVL3 (GOWN DISPOSABLE) ×6 IMPLANT
GOWN STRL REUS W/ TWL XL LVL3 (GOWN DISPOSABLE) ×2 IMPLANT
GOWN STRL REUS W/TWL LRG LVL3 (GOWN DISPOSABLE) ×9
GOWN STRL REUS W/TWL XL LVL3 (GOWN DISPOSABLE) ×3
HEMOSTAT SNOW SURGICEL 2X4 (HEMOSTASIS) IMPLANT
KIT BASIN OR (CUSTOM PROCEDURE TRAY) ×4 IMPLANT
KIT TURNOVER KIT B (KITS) ×4 IMPLANT
LEAD CAROTID BAROSTIM 1036 (Lead) ×2 IMPLANT
MARKER SKIN DUAL TIP RULER LAB (MISCELLANEOUS) ×4 IMPLANT
NDL 18GX1X1/2 (RX/OR ONLY) (NEEDLE) ×2 IMPLANT
NEEDLE 18GX1X1/2 (RX/OR ONLY) (NEEDLE) ×3 IMPLANT
NS IRRIG 1000ML POUR BTL (IV SOLUTION) ×8 IMPLANT
PACK CAROTID (CUSTOM PROCEDURE TRAY) ×4 IMPLANT
PAD ARMBOARD 7.5X6 YLW CONV (MISCELLANEOUS) ×8 IMPLANT
POSITIONER HEAD DONUT 9IN (MISCELLANEOUS) ×4 IMPLANT
SUT ETHIBOND CT1 BRD #0 30IN (SUTURE) ×8 IMPLANT
SUT ETHILON 3 0 PS 1 (SUTURE) IMPLANT
SUT PROLENE 6 0 BV (SUTURE) ×40 IMPLANT
SUT SILK 0 FSL (SUTURE) IMPLANT
SUT VIC AB 3-0 SH 27 (SUTURE) ×9
SUT VIC AB 3-0 SH 27X BRD (SUTURE) ×4 IMPLANT
SUT VICRYL 4-0 PS2 18IN ABS (SUTURE) ×8 IMPLANT
SYR 5ML LL (SYRINGE) ×4 IMPLANT
SYR BULB IRRIG 60ML STRL (SYRINGE) ×4 IMPLANT
TOWEL GREEN STERILE (TOWEL DISPOSABLE) ×4 IMPLANT
WATER STERILE IRR 1000ML POUR (IV SOLUTION) ×4 IMPLANT

## 2021-11-26 NOTE — Discharge Instructions (Addendum)
May shower in 24 hours

## 2021-11-26 NOTE — Anesthesia Procedure Notes (Signed)
Arterial Line Insertion Start/End12/16/2022 11:30 AM, 11/26/2021 11:35 AM Performed by: Josephine Igo, MD, anesthesiologist  Patient location: Pre-op. Preanesthetic checklist: patient identified, IV checked, site marked, risks and benefits discussed, surgical consent, monitors and equipment checked, pre-op evaluation, timeout performed and anesthesia consent Lidocaine 1% used for infiltration Left, radial was placed Catheter size: 20 G Hand hygiene performed  and maximum sterile barriers used  Allen's test indicative of satisfactory collateral circulation Attempts: 1 Procedure performed without using ultrasound guided technique. Ultrasound Notes:anatomy identified, needle tip was noted to be adjacent to the nerve/plexus identified and no ultrasound evidence of intravascular and/or intraneural injection Following insertion, Biopatch. Post procedure assessment: normal  Post procedure complications: second provider assisted. Patient tolerated the procedure well with no immediate complications. Additional procedure comments: Multiple attempts by CRNA prior to MD arrival..

## 2021-11-26 NOTE — Anesthesia Procedure Notes (Addendum)
Procedure Name: Intubation Date/Time: 11/26/2021 12:02 PM Performed by: Renato Shin, CRNA Pre-anesthesia Checklist: Patient identified, Emergency Drugs available, Suction available and Patient being monitored Patient Re-evaluated:Patient Re-evaluated prior to induction Oxygen Delivery Method: Circle system utilized Preoxygenation: Pre-oxygenation with 100% oxygen Induction Type: IV induction Ventilation: Mask ventilation without difficulty Laryngoscope Size: Miller and 3 Grade View: Grade I Tube type: Oral Number of attempts: 1 Airway Equipment and Method: Stylet and Oral airway Placement Confirmation: ETT inserted through vocal cords under direct vision, positive ETCO2 and breath sounds checked- equal and bilateral Secured at: 23 cm Tube secured with: Tape Dental Injury: Teeth and Oropharynx as per pre-operative assessment

## 2021-11-26 NOTE — Op Note (Signed)
° ° °  Patient name: Jeffrey [name] MRN: [id number] DOB: 1966/08/18 Sex: male  11/26/2021 Pre-operative Diagnosis: NYHA class III heart Failure Post-operative diagnosis:  Same Surgeon:  Annamarie Major Assistants:  Laurence Slate, PA Procedure:   Barostim Neo device implant Anesthesia:  General Blood Loss:  minimal Specimens:  none  Findings:  Device at position A  IPG SN  [id number]  model:  [id number] Lead SN:  [id number]  model:  [id number]  Indications: This is a 55 year old gentleman with NYHA class III heart failure on goal-directed medical therapy.  He still has residual symptoms and has been approved for a Barostim Neo device implant.  In the holding area I again discussed the details of the procedure as well as the risks and benefits and he wished to proceed.  Procedure:  The patient was identified in the holding area and taken to [place] [address]  The patient was then placed supine on the table. general anesthesia was administered.  The patient was prepped and draped in the usual sterile fashion.  A time out was called and antibiotics were administered.  A PA was necessary to expedite procedure and assist with technical details.  Ultrasound was used to identify the carotid bifurcation which was marked on the skin surface.  I then made a transverse 3 cm incision at this level.  Cautery was used divide subcutaneous tissue and platysma muscle.  I then dissected out the common carotid artery.  The patient had a very high bifurcation.  In order to get adequate exposure, an appendiceal retractor was utilized.  I had to mobilize the hypoglossal nerve to get good visualization.  The carotid artery was slightly externally rotated and so it had to be manipulated to get good exposure.  Once I was satisfied with exposure, attention was turned towards the device implant.  A transverse incision was made in the upper chest 1 fingerbreadth below the clavicle.  Cautery was used divide subcutaneous tissue down to the  pectoral fascia.  A pocket was then created on top of the pectoral fascia.  I then used a tonsil clamp to tunnel between the 2 incisions.  The lead was then brought through the incision and connected to the IPG.  The IPG was placed into the pocket and device testing was performed.  We had an appropriate hemodynamic response at position a.  The device was sutured in at this position and then retested with again a good response.  A total of six 6-0 Prolene sutures were placed to secure the lead.  The strain relief loop was then sutured to the anterior surface of the common carotid artery.  Next, 2 Ethibond 2-0 sutures were used to secure the IPG to the pectoral fascia.  The IPG was then placed into the pocket.  The wounds were irrigated with antibiotic impregnated solution.  Hemostasis was achieved.  The neck incision was closed by reapproximating the platysma muscle with 3-0 Vicryl and a subcuticular closure.  The chest incision was closed by reapproximating the subcutaneous tissue with 3-0 Vicryl and the skin with subcuticular closure.  Dermabond was placed on the incisions.  There were no immediate complications.  The patient was successfully extubated and taken recovery in stable condition.   Disposition: To PACU stable.   Theotis Burrow, M.D., [facility] [place] Office: [phone] Pager:  [phone]

## 2021-11-26 NOTE — Anesthesia Postprocedure Evaluation (Signed)
Anesthesia Post Note  Patient: Jeffrey [name]  Procedure(s) Performed: INSERTION OF [name] (Right: Chest)     Patient location during evaluation: PACU Anesthesia Type: General Level of consciousness: awake and alert and oriented Pain management: pain level controlled Vital Signs Assessment: post-procedure vital signs reviewed and stable Respiratory status: spontaneous breathing, nonlabored ventilation, respiratory function stable and patient connected to nasal cannula oxygen Cardiovascular status: blood pressure returned to baseline and stable Postop Assessment: no apparent nausea or vomiting Anesthetic complications: no   No notable events documented.  Last Vitals:  Vitals:   11/26/21 1508 11/26/21 1515  BP:    Pulse: 76   Resp: 15 16  Temp: 36.7 C   SpO2: 98%     Last Pain:  Vitals:   11/26/21 1445  TempSrc:   PainSc: 0-No pain                 Jeffrey [name] A.

## 2021-11-26 NOTE — Transfer of Care (Signed)
Immediate Anesthesia Transfer of Care Note  Patient: Jeffrey [name]  Procedure(s) Performed: INSERTION OF [name] (Right: Chest)  Patient Location: PACU  Anesthesia Type:General  Level of Consciousness: awake and patient cooperative  Airway & Oxygen Therapy: Patient Spontanous Breathing and Patient connected to face mask oxygen  Post-op Assessment: Report given to RN and Post -op Vital signs reviewed and stable  Post vital signs: Reviewed and stable  Last Vitals:  Vitals Value Taken Time  BP 170/110 11/26/21 1417  Temp    Pulse 79 11/26/21 1423  Resp 16 11/26/21 1423  SpO2 99 % 11/26/21 1423  Vitals shown include unvalidated device data.  Last Pain:  Vitals:   11/26/21 1115  TempSrc:   PainSc: 0-No pain         Complications: No notable events documented.

## 2021-11-26 NOTE — H&P (Signed)
[facility] [place]   Patient name: Jeffrey [name]       MRN: [id number]        DOB: 24-Sep-1966          Sex: male            REQUESTING PROVIDER:     Dr. Rayann Heman             REASON FOR APPOINTMENT:    Barostim evaluation   HISTORY OF PRESENT ILLNESS:    Jeffrey [name] is a 54 y.o. male, who is being referred for Barostim device implant.  He was initially enrolled in the [place] study, however I felt his bifurcation was too high for the percutaneous approach.  I did feel that he was a good candidate for a commercial device.   The patient has a single-chamber ICD in place since 2019.  He has a history of coronary artery disease, status post cath in 2018 for which medical management was recommended.  He is a type II diabetic.  He is medically managed for hypertension.  He is on a statin for hypercholesterolemia.       PAST MEDICAL HISTORY          Past Medical History:  Diagnosis Date   AICD (automatic cardioverter/defibrillator) present      Medtronic   AICD (automatic cardioverter/defibrillator) present      MDT Visia AF MRI   Anemia     CAD (coronary artery disease)      a. cath 01/31/17: 60% 1st RPLB, 60% dist RCA, 55% prox RCA, 10% pro LAD --> Rx TX.    CHF (congestive heart failure) (HCC)     Chronic systolic CHF (congestive heart failure) ([place]) 01/28/2017    1. Echo 01/29/17:  EF 20-25, normal wall motion, mild LAE // 2. EF 10-15 by [facility] 01/2017    Coronary artery disease     Diabetes mellitus      type II   Diabetes mellitus without complication ([place])     Diabetic foot infection ([place]) 03/2016    RT FOOT   Dyspnea     History of kidney stones      passed   History of kidney stones     HTN (hypertension)     Hyperlipidemia     Hypertension     Myocardial infarction [facility]), although reported, NICM at cath     NICM (nonischemic cardiomyopathy) ([place]) 02/15/2017    1. Mod non-obs CAD on LHC in 01/2017 - CAD does not explain  cardiomyopathy   Renal disorder          FAMILY HISTORY         Family History  Problem Relation Age of Onset   Diabetes Mother     Hypertension Mother     Diabetes Father     Heart attack Father     Diabetes Sister     Heart attack Maternal Grandmother        SOCIAL HISTORY:    Social History         Socioeconomic History   Marital status: Unknown      Spouse name: Not on file   Number of children: Not on file   Years of education: Not on file   Highest education level: Not on file  Occupational History   Not on file  Tobacco Use   Smoking status: Never   Smokeless tobacco: Never  Vaping Use   Vaping Use: Never  used  Substance and Sexual Activity   Alcohol use: Never   Drug use: Never   Sexual activity: Not on file  Other Topics Concern   Not on file  Social History Narrative    ** Merged History Encounter **         Social Determinants of Health    Financial Resource Strain: Not on file  Food Insecurity: Not on file  Transportation Needs: Not on file  Physical Activity: Not on file  Stress: Not on file  Social Connections: Not on file  Intimate Partner Violence: Not on file      ALLERGIES:      No Known Allergies   CURRENT MEDICATIONS:            Current Outpatient Medications  Medication Sig Dispense Refill   Accu-Chek Softclix Lancets lancets Use to check blood sugar three times daily E11.65 100 each 5   albuterol (VENTOLIN HFA) 108 (90 Base) MCG/ACT inhaler Inhale 1 to 2 puffs by mouth into the lungs every 6 hours as needed for wheezing or shortness of breath. 18 g 0   aspirin 81 MG EC tablet Take 1 tablet (81 mg total) by mouth daily. Swallow whole. 30 tablet 2   Blood Glucose Monitoring Suppl (ACCU-CHEK GUIDE) w/Device KIT Use to check blood sugar three times daily E11.65 1 kit 0   carvedilol (COREG) 12.5 MG tablet TAKE 1 TABLET (12.5 MG TOTAL) BY MOUTH 2 (TWO) TIMES DAILY WITH A MEAL. 24 tablet 0   ferrous sulfate 325 (65 FE) MG tablet  Take 1 tablet (325 mg total) by mouth 2 (two) times daily with a meal. 60 tablet 3   furosemide (LASIX) 40 MG tablet TAKE 1.5 TABLETS (60 MG TOTAL) BY MOUTH DAILY. (Patient taking differently: Take 60 mg by mouth daily.) 45 tablet 6   gabapentin (NEURONTIN) 400 MG capsule TAKE 1 CAPSULE BY MOUTH 3 TAKE TIMES DAILY. (Patient taking differently: Take 400 mg by mouth 3 (three) times daily.) 21 capsule 0   glucose blood (ACCU-CHEK GUIDE) test strip Use to check blood sugar three times daily E11.65 100 each 12   hydrALAZINE (APRESOLINE) 25 MG tablet Take 1 tablet (25 mg total) by mouth 3 (three) times daily. 40 tablet 0   insulin aspart (NOVOLOG FLEXPEN) 100 UNIT/ML FlexPen Inject 0-9 Units into the skin See admin instructions. Inject 0-9 units into the skin 3 times a day with meals, PER SLIDING SCALE:  CBG 70 - 120 =       0 unit CBG 121 - 150:       1 unit      CBG 151 - 200:       2 units     CBG 201 - 250:       3 units     CBG 251 - 300:       5 units     CBG 301 - 350:       7 units     CBG 351 - 400        9 units 3 mL 0   Insulin Pen Needle (BD PEN NEEDLE NANO U/F) 32G X 4 MM MISC USE TO INJECT LANTUS DAILY. MUST USE NEW PEN NEEDLE WITH EACH INJECTION. 100 each 0   isosorbide mononitrate (IMDUR) 30 MG 24 hr tablet Take 1 tablet (30 mg total) by mouth daily. 21 tablet 0   LANTUS SOLOSTAR 100 UNIT/ML Solostar Pen Inject 35 Units into the skin daily. 15  mL 0   magnesium oxide (MAG-OX) 400 MG tablet Take 1 tablet (400 mg total) by mouth daily. 15 tablet 0   ondansetron (ZOFRAN) 4 MG tablet Take 1 tablet (4 mg total) by mouth every 6 (six) hours as needed for nausea or vomiting. 12 tablet 0   Potassium Chloride ER 20 MEQ TBCR Take 2 tablets by mouth daily. (Patient taking differently: Take 40 mEq by mouth daily.) 30 tablet 0   potassium chloride SA (KLOR-CON) 20 MEQ tablet Take 2 tablets (40 mEq total) by mouth daily. 30 tablet 0   rosuvastatin (CRESTOR) 40 MG tablet TAKE 1 TABLET (40 MG TOTAL) BY  MOUTH DAILY. (Patient taking differently: Take 40 mg by mouth at bedtime.) 90 tablet 1   albuterol (VENTOLIN HFA) 108 (90 Base) MCG/ACT inhaler Inhale 1-2 puffs into the lungs every 6 (six) hours as needed for wheezing or shortness of breath. (Patient not taking: No sig reported) 1 each 0   cetirizine (ZYRTEC) 10 MG tablet TAKE 1 TABLET (10 MG TOTAL) BY MOUTH DAILY. (Patient not taking: No sig reported) 30 tablet 0    No current facility-administered medications for this visit.      REVIEW OF SYSTEMS:    Please see the history of present illness.      All other systems reviewed and are negative.   PHYSICAL EXAM:          Recent Labs: 03/30/2021: NT-Pro BNP 290 06/30/2021: B Natriuretic Peptide 30.6 07/01/2021: TSH 0.709 08/06/2021: ALT 24; Magnesium 1.4 10/07/2021: BUN 18; Creatinine, Ser 2.10; Hemoglobin 11.6; Platelets 268; Potassium 3.6; Sodium 138    Recent Lipid Panel Labs (Brief)       Lab Results  Component Value Date/Time    CHOL 201 (H) 07/02/2021 12:37 AM    CHOL 137 04/25/2019 09:25 AM    TRIG 225 (H) 07/02/2021 12:37 AM    HDL 36 (L) 07/02/2021 12:37 AM    HDL 36 (L) 04/25/2019 09:25 AM    CHOLHDL 5.6 07/02/2021 12:37 AM    LDLCALC 120 (H) 07/02/2021 12:37 AM    LDLCALC 57 04/25/2019 09:25 AM           Wt Readings from Last 3 Encounters:  08/13/21 219 lb 12.8 oz (99.7 kg)  08/07/21 219 lb 11.2 oz (99.7 kg)  08/03/21 209 lb 7 oz (95 kg)      STUDIES:    I have reviewed his echocardiogram which shows ejection fraction of 30-35%   I have reviewed his carotid duplex which shows 1-39% stenosis ASSESSMENT and PLAN    NYHA class III chronic systolic dysfunction: The patient has failed to improve despite optimization of guideline directed medical therapy.  He is an appropriate candidate for a Barostim device implant which would be on the right side.  I discussed the details of the operation with the patient.  All of his questions were answered.  We will move  forward       Time:   Today, I have spent 14 minutes with the patient with telehealth technology discussing the above problems.           Leia Alf, MD, FACS [facility] [phone] Pager [phone]    No changes since our last visit PE: CV:RRR PULM:CTA Neuro:  no focal deficits Plan for right sided Barostim.  All questions answered  Wells Holley Kocurek

## 2021-11-29 LAB — SURGICAL PATHOLOGY

## 2021-11-30 ENCOUNTER — Telehealth: Payer: Self-pay

## 2021-11-30 NOTE — Telephone Encounter (Signed)
-----   Message from Ladell Pier, MD sent at 11/29/2021 10:11 PM EST ----- Let pt know that lymph node removed from right side of neck a 3 days ago during procedure revealed no cancerous cells.

## 2021-11-30 NOTE — Telephone Encounter (Signed)
Patient name and DOB has been verified Patient was informed of lab results. Patient had no questions.  

## 2021-12-01 ENCOUNTER — Other Ambulatory Visit: Payer: Medicare Other

## 2021-12-08 ENCOUNTER — Other Ambulatory Visit: Payer: Self-pay

## 2021-12-08 ENCOUNTER — Ambulatory Visit
Admission: RE | Admit: 2021-12-08 | Discharge: 2021-12-08 | Disposition: A | Discharge status: Home / Self Care | Payer: Medicare Other | Source: Ambulatory Visit | Attending: Nephrology | Admitting: Nephrology

## 2021-12-08 DIAGNOSIS — N1832 Chronic kidney disease, stage 3b: Secondary | ICD-10-CM

## 2021-12-09 ENCOUNTER — Other Ambulatory Visit: Payer: Self-pay

## 2021-12-10 ENCOUNTER — Other Ambulatory Visit: Payer: Self-pay

## 2021-12-13 ENCOUNTER — Other Ambulatory Visit: Payer: Self-pay

## 2021-12-14 ENCOUNTER — Other Ambulatory Visit: Payer: Self-pay

## 2021-12-16 ENCOUNTER — Ambulatory Visit: Payer: Medicare Other | Admitting: Neurology

## 2021-12-17 ENCOUNTER — Encounter: Payer: Medicare Other | Admitting: Student

## 2021-12-20 ENCOUNTER — Encounter: Payer: Medicare Other | Admitting: Surgery

## 2021-12-23 ENCOUNTER — Other Ambulatory Visit: Payer: Self-pay

## 2021-12-24 ENCOUNTER — Other Ambulatory Visit: Payer: Self-pay

## 2022-01-03 ENCOUNTER — Encounter: Payer: Medicare Other | Admitting: Surgery

## 2022-01-13 ENCOUNTER — Encounter: Payer: Medicare Other | Admitting: Internal Medicine

## 2022-01-13 ENCOUNTER — Ambulatory Visit: Payer: Medicare Other | Admitting: Neurology

## 2022-01-13 ENCOUNTER — Encounter: Payer: Self-pay | Admitting: Neurology

## 2022-01-19 NOTE — Progress Notes (Unsigned)
Electrophysiology Office Note Date: 01/19/2022  ID:  Jeffrey [name], DOB 1966-02-20, MRN [id number]  PCP: Charlott Rakes, MD Primary Cardiologist: Mertie Moores, MD Electrophysiologist: Virl Axe, MD   CC: Barostim titration   Jeffrey [name] is a 56 y.o. male seen today for barostim titration.  His device was implanted 11/26/2021 and he has missed multiple visits for titration.   He has been doing *** since implantation. The patients goals for barostim titration are ***   Today ***  Device History: Barostim (standard) implanted 11/26/2021 for Chronic systolic CHF Medtronic Single Chamber ICD implanted 01/2018 for NICM and syncope History of appropriate therapy: No History of AAD therapy: No    Past Medical History:  Diagnosis Date   AICD (automatic cardioverter/defibrillator) present    Medtronic   AICD (automatic cardioverter/defibrillator) present    MDT Visia AF MRI   Anemia    CAD (coronary artery disease)    a. cath 01/31/17: 60% 1st RPLB, 60% dist RCA, 55% prox RCA, 10% pro LAD --> Rx TX.    CHF (congestive heart failure) (HCC)    Chronic systolic CHF (congestive heart failure) ([place]) 01/28/2017   1. Echo 01/29/17:  EF 20-25, normal wall motion, mild LAE // 2. EF 10-15 by [facility] 01/2017    Coronary artery disease    Diabetes mellitus    type II   Diabetes mellitus without complication ([place])    Diabetic foot infection ([place]) 03/2016   RT FOOT   Dyspnea    History of kidney stones    passed   History of kidney stones    HTN (hypertension)    Hyperlipidemia    Hypertension    Myocardial infarction [facility]), although reported, NICM at cath    NICM (nonischemic cardiomyopathy) ([place]) 02/15/2017   1. Mod non-obs CAD on LHC in 01/2017 - CAD does not explain cardiomyopathy   Renal disorder    Past Surgical History:  Procedure Laterality Date   AIR/FLUID EXCHANGE Right 07/14/2020   Procedure: AIR/FLUID EXCHANGE;  Surgeon: Jalene Mullet, MD;  Location: [place];   Service: Ophthalmology;  Laterality: Right;   AMPUTATION Right 04/01/2016   Procedure: Right Great Toe Amputation;  Surgeon: Newt Minion, MD;  Location: [place];  Service: Orthopedics;  Laterality: Right;   AMPUTATION Right 06/19/2016   Procedure: AMPUTATION SECOND TOE;  Surgeon: Marybelle Killings, MD;  Location: [place];  Service: Orthopedics;  Laterality: Right;   BACK SURGERY     for abscess   CARDIAC CATHETERIZATION     ICD IMPLANT N/A 01/15/2018   Procedure: ICD IMPLANT;  Surgeon: Deboraha Sprang, MD;  Location: [place] [facility];  Service: Cardiovascular;  Laterality: N/A;   INJECTION OF SILICONE OIL Right 03/16/9976   Procedure: INJECTION OF SILICONE OIL;  Surgeon: Jalene Mullet, MD;  Location: [place];  Service: Ophthalmology;  Laterality: Right;   INJECTION OF SILICONE OIL Right 03/25/2394   Procedure: INJECTION OF SILICONE OIL;  Surgeon: Jalene Mullet, MD;  Location: [place];  Service: Ophthalmology;  Laterality: Right;   INSERT / REPLACE / REMOVE PACEMAKER     MEMBRANE PEEL Right 08/25/2020   Procedure: MEMBRANE PEEL;  Surgeon: Jalene Mullet, MD;  Location: [place];  Service: Ophthalmology;  Laterality: Right;   PARS PLANA VITRECTOMY Right 07/14/2020   Procedure: PARS PLANA VITRECTOMY WITH 25 GAUGE, Membranetomy, drainage of subretinal fluid;  Surgeon: Jalene Mullet, MD;  Location: [place];  Service: Ophthalmology;  Laterality: Right;   PARS PLANA  VITRECTOMY Right 08/25/2020   Procedure: PARS PLANA VITRECTOMY WITH 25 GAUGE;  Surgeon: Jalene Mullet, MD;  Location: [place];  Service: Ophthalmology;  Laterality: Right;   PHOTOCOAGULATION WITH LASER Right 07/14/2020   Procedure: PHOTOCOAGULATION WITH LASER;  Surgeon: Jalene Mullet, MD;  Location: [place];  Service: Ophthalmology;  Laterality: Right;   PHOTOCOAGULATION WITH LASER Right 08/25/2020   Procedure: PHOTOCOAGULATION WITH LASER;  Surgeon: Jalene Mullet, MD;  Location: [place];  Service: Ophthalmology;  Laterality: Right;   REPAIR OF COMPLEX  TRACTION RETINAL DETACHMENT Right 08/25/2020   Procedure: REPAIR OF HEMORRHAGIC DETACHMENT;  Surgeon: Jalene Mullet, MD;  Location: [place];  Service: Ophthalmology;  Laterality: Right;   RIGHT/LEFT HEART CATH AND CORONARY ANGIOGRAPHY N/A 01/31/2017   Procedure: Right/Left Heart Cath and Coronary Angiography;  Surgeon: Troy Sine, MD;  Location: [place] [facility];  Service: Cardiovascular;  Laterality: N/A;   SILICON OIL REMOVAL Right 0/34/7425   Procedure: SILICON OIL REMOVAL;  Surgeon: Jalene Mullet, MD;  Location: [place];  Service: Ophthalmology;  Laterality: Right;    Current Outpatient Medications  Medication Sig Dispense Refill   Accu-Chek Softclix Lancets lancets Use to check blood sugar three times daily E11.65 100 each 5   albuterol (VENTOLIN HFA) 108 (90 Base) MCG/ACT inhaler Inhale 1 to 2 puffs by mouth into the lungs every 6 hours as needed for wheezing or shortness of breath. 18 g 0   aspirin 81 MG EC tablet Take 1 tablet (81 mg total) by mouth daily. Swallow whole. 30 tablet 2   Blood Glucose Monitoring Suppl (ACCU-CHEK GUIDE) w/Device KIT Use to check blood sugar three times daily E11.65 1 kit 0   carvedilol (COREG) 12.5 MG tablet TAKE 1 TABLET (12.5 MG TOTAL) BY MOUTH 2 (TWO) TIMES DAILY WITH A MEAL. 60 tablet 6   cetirizine (ZYRTEC) 10 MG tablet TAKE 1 TABLET (10 MG TOTAL) BY MOUTH DAILY. 30 tablet 0   Elastic Bandages & Supports (T.E.D. KNEE LENGTH/S-LONG) MISC 1 (one) each daily, apply compression stockings daily to help decrease leg swelling 1 each 0   ferrous sulfate 325 (65 FE) MG tablet Take 1 tablet (325 mg total) by mouth 2 (two) times daily with a meal. 60 tablet 3   furosemide (LASIX) 40 MG tablet Take 1 tablet (40 mg total) by mouth 2 (two) times daily. 60 tablet 6   gabapentin (NEURONTIN) 300 MG capsule Take 2 capsules (600 mg total) by mouth at bedtime. (Patient taking differently: Take 300 mg by mouth at bedtime.) 60 capsule 6   glucose blood (ACCU-CHEK GUIDE)  test strip Use to check blood sugar three times daily E11.65 100 each 12   hydrALAZINE (APRESOLINE) 25 MG tablet Take 1 tablet (25 mg total) by mouth 3 (three) times daily. 90 tablet 6   insulin aspart (NOVOLOG FLEXPEN) 100 UNIT/ML FlexPen Inject 0-9 Units into the skin See admin instructions. Inject 0-9 units into the skin 3 times a day with meals, PER SLIDING SCALE:  CBG 70 - 120 =  0 unit CBG 121 - 150: 1 unit  CBG 151 - 200: 2 units  CBG 201 - 250: 3 units  CBG 251 - 300: 5 units  CBG 301 - 350: 7 units  CBG 351 - 400 9 units 3 mL 1   Insulin Pen Needle (BD PEN NEEDLE NANO U/F) 32G X 4 MM MISC USE TO INJECT LANTUS DAILY. MUST USE NEW PEN NEEDLE WITH EACH INJECTION. 100 each 0   isosorbide mononitrate (  IMDUR) 30 MG 24 hr tablet Take 1 tablet (30 mg total) by mouth daily. 30 tablet 6   LANTUS SOLOSTAR 100 UNIT/ML Solostar Pen Inject 35 Units into the skin daily. (Patient taking differently: Inject 35 Units into the skin at bedtime.) 30 mL 6   magnesium oxide (MAG-OX) 400 MG tablet Take 1 tablet (400 mg total) by mouth daily. 15 tablet 0   ondansetron (ZOFRAN) 4 MG tablet Take 1 tablet (4 mg total) by mouth every 6 (six) hours as needed for nausea or vomiting. 12 tablet 0   oxyCODONE-acetaminophen (PERCOCET/ROXICET) 5-325 MG tablet Take 1 tablet by mouth every 6 (six) hours as needed. 20 tablet 0   potassium chloride SA (KLOR-CON M) 20 MEQ tablet Take 2 tablets (40 mEq total) by mouth daily. 30 tablet 6   rosuvastatin (CRESTOR) 40 MG tablet TAKE 1 TABLET (40 MG TOTAL) BY MOUTH DAILY. 90 tablet 1   tamsulosin (FLOMAX) 0.4 MG CAPS capsule Take 1 capsule by mouth nightly, Stop if it causes dizziness 30 capsule 3   No current facility-administered medications for this visit.    Allergies:   Patient has no known allergies.   Social History: Social History   Socioeconomic History   Marital status: Significant Other    Spouse name: Significant other [name]   Number of children: 1   Years  of education: Not on file   Highest education level: Not on file  Occupational History   Not on file  Tobacco Use   Smoking status: Never   Smokeless tobacco: Never  Vaping Use   Vaping Use: Never used  Substance and Sexual Activity   Alcohol use: Never   Drug use: Never   Sexual activity: Not Currently  Other Topics Concern   Not on file  Social History Narrative   ** Merged History Encounter **       Social Determinants of Health   Financial Resource Strain: Not on file  Food Insecurity: Not on file  Transportation Needs: Not on file  Physical Activity: Not on file  Stress: Not on file  Social Connections: Not on file  Intimate Partner Violence: Not on file    Family History: Family History  Problem Relation Age of Onset   Diabetes Mother    Hypertension Mother    Diabetes Father    Heart attack Father    Diabetes Sister    Heart attack Maternal Grandmother      Review of Systems: All other systems reviewed and are otherwise negative except as noted above.  Physical Exam: There were no vitals filed for this visit.   GEN- The patient is well appearing, alert and oriented x 3 today.   HEENT: normocephalic, atraumatic; sclera clear, conjunctiva pink; hearing intact; oropharynx clear; neck supple  Lungs- Clear to ausculation bilaterally, normal work of breathing.  No wheezes, rales, rhonchi Heart- Regular rate and rhythm, no murmurs, rubs or gallops  GI- soft, non-tender, non-distended, bowel sounds present  Extremities- no clubbing, cyanosis, or edema  MS- no significant deformity or atrophy Skin- warm and dry, no rash or lesion; PPM pocket well healed Psych- euthymic mood, full affect Neuro- strength and sensation are intact  Barostim Interrogation- Performed personally and reviewed in detail today,  See scanned report  EKG:  EKG is not ordered today.  Recent Labs: 03/30/2021: NT-Pro BNP 290 07/01/2021: TSH 0.709 10/20/2021: BNP 70.6 11/09/2021:  Magnesium 1.6 11/24/2021: ALT 19; BUN 25; Creatinine, Ser 2.04; Hemoglobin 11.4; Platelets 271; Potassium 3.6; Sodium  136   Wt Readings from Last 3 Encounters:  11/26/21 215 lb (97.5 kg)  11/24/21 222 lb 14.4 oz (101.1 kg)  11/09/21 217 lb 12.8 oz (98.8 kg)     Other studies Reviewed: Additional studies/ records that were reviewed today include: EP office notes  Assessment and Plan:  1. Chronic systolic CHF s/p Medtronic ICD and Barostim implantation NYHA *** symptoms.   Device titrated from *** millamp to *** milliamp by increments of 0.4 with good blood pressure response and no stimulation.  Device impedence stable. Pt goals are to ***  Normal device function See scanned report. Will follow up in *** weeks to continue titration with goal of 6-8 milliamps for chronic settings.   2. HTN Stable on current regimen   3. Non-compliance Stressed importance of follow up   4. Uncontrolled diabetes Hgb A1c 14.2 in July; I don't see one since.  Current medicines are reviewed at length with the patient today.   The patient {ACTIONS; HAS/DOES NOT HAVE:19233} concerns regarding his medicines.  The following changes were made today:  none  Labs/ tests ordered today include:  No orders of the defined types were placed in this encounter.   Disposition:   Follow up with {Blank single:19197::"Dr. Allred","Dr. Arlan Organ. Klein","Dr. Camnitz","EP APP"} in 4 {Blank single:19197::"Months","Weeks"} for further barostim titration.   Jacalyn Lefevre, PA-C  01/19/2022 10:12 AM  [facility] [address] [place] [address] [phone] (office) [phone] (fax)

## 2022-01-24 ENCOUNTER — Ambulatory Visit: Payer: Medicare Other | Admitting: Family Medicine

## 2022-01-24 NOTE — Progress Notes (Unsigned)
Subjective:  Patient ID: Jeffrey [name], male    DOB: Apr 08, 1966  Age: 56 y.o. MRN: [id number]  CC: No chief complaint on file.   HPI Jeffrey [name] is a 56 y.o. year old male with a history of type 2 diabetes mellitus (A1c A1c is 7.6), diabetic neuropathy, nephrotic syndrome status post right great and second toe amputation , hypertension, Nonischemic cardiomyopathy, CHF (EF 30-35% from 06/2021 status post ICD placement in 01/2018), hospitalized in 06/2021 for seizure and DKA and questionable TIA. In 11/2021 he underwent right neck Barostim placement.  Interval History: He did have an ED presentation last month for neck pain at the site of his [place] presentation.  Evaluated at [facility] hospital ED and notes reviewed.  He was discharged after communication with his vascular surgeon with recommendations to follow-up in the office with vascular due to low risk of complications. Vascular visit  Last cardiology visit   Nephrology visit Past Medical History:  Diagnosis Date   AICD (automatic cardioverter/defibrillator) present    Medtronic   AICD (automatic cardioverter/defibrillator) present    MDT Visia AF MRI   Anemia    CAD (coronary artery disease)    a. cath 01/31/17: 60% 1st RPLB, 60% dist RCA, 55% prox RCA, 10% pro LAD --> Rx TX.    CHF (congestive heart failure) (HCC)    Chronic systolic CHF (congestive heart failure) ([place]) 01/28/2017   1. Echo 01/29/17:  EF 20-25, normal wall motion, mild LAE // 2. EF 10-15 by [facility] 01/2017    Coronary artery disease    Diabetes mellitus    type II   Diabetes mellitus without complication ([place])    Diabetic foot infection ([place]) 03/2016   RT FOOT   Dyspnea    History of kidney stones    passed   History of kidney stones    HTN (hypertension)    Hyperlipidemia    Hypertension    Myocardial infarction [facility]), although reported, NICM at cath    NICM (nonischemic cardiomyopathy) ([place]) 02/15/2017   1. Mod non-obs CAD on LHC in  01/2017 - CAD does not explain cardiomyopathy   Renal disorder     Past Surgical History:  Procedure Laterality Date   AIR/FLUID EXCHANGE Right 07/14/2020   Procedure: AIR/FLUID EXCHANGE;  Surgeon: Jalene Mullet, MD;  Location: [place];  Service: Ophthalmology;  Laterality: Right;   AMPUTATION Right 04/01/2016   Procedure: Right Great Toe Amputation;  Surgeon: Newt Minion, MD;  Location: [place];  Service: Orthopedics;  Laterality: Right;   AMPUTATION Right 06/19/2016   Procedure: AMPUTATION SECOND TOE;  Surgeon: Marybelle Killings, MD;  Location: [place];  Service: Orthopedics;  Laterality: Right;   BACK SURGERY     for abscess   CARDIAC CATHETERIZATION     ICD IMPLANT N/A 01/15/2018   Procedure: ICD IMPLANT;  Surgeon: Deboraha Sprang, MD;  Location: [place] [facility];  Service: Cardiovascular;  Laterality: N/A;   INJECTION OF SILICONE OIL Right 12/16/7260   Procedure: INJECTION OF SILICONE OIL;  Surgeon: Jalene Mullet, MD;  Location: [place];  Service: Ophthalmology;  Laterality: Right;   INJECTION OF SILICONE OIL Right 0/35/5974   Procedure: INJECTION OF SILICONE OIL;  Surgeon: Jalene Mullet, MD;  Location: [place];  Service: Ophthalmology;  Laterality: Right;   INSERT / REPLACE / REMOVE PACEMAKER     MEMBRANE PEEL Right 08/25/2020   Procedure: MEMBRANE PEEL;  Surgeon: Jalene Mullet, MD;  Location: [place];  Service:  Ophthalmology;  Laterality: Right;   PARS PLANA VITRECTOMY Right 07/14/2020   Procedure: PARS PLANA VITRECTOMY WITH 25 GAUGE, Membranetomy, drainage of subretinal fluid;  Surgeon: Jalene Mullet, MD;  Location: [place];  Service: Ophthalmology;  Laterality: Right;   PARS PLANA VITRECTOMY Right 08/25/2020   Procedure: PARS PLANA VITRECTOMY WITH 25 GAUGE;  Surgeon: Jalene Mullet, MD;  Location: [place];  Service: Ophthalmology;  Laterality: Right;   PHOTOCOAGULATION WITH LASER Right 07/14/2020   Procedure: PHOTOCOAGULATION WITH LASER;  Surgeon: Jalene Mullet, MD;  Location: [place];  Service:  Ophthalmology;  Laterality: Right;   PHOTOCOAGULATION WITH LASER Right 08/25/2020   Procedure: PHOTOCOAGULATION WITH LASER;  Surgeon: Jalene Mullet, MD;  Location: [place];  Service: Ophthalmology;  Laterality: Right;   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   Procedure: REPAIR OF HEMORRHAGIC DETACHMENT;  Surgeon: Jalene Mullet, MD;  Location: [place];  Service: Ophthalmology;  Laterality: Right;   RIGHT/LEFT HEART CATH AND CORONARY ANGIOGRAPHY N/A 01/31/2017   Procedure: Right/Left Heart Cath and Coronary Angiography;  Surgeon: Troy Sine, MD;  Location: [place] [facility];  Service: Cardiovascular;  Laterality: N/A;   SILICON OIL REMOVAL Right 9/73/5329   Procedure: SILICON OIL REMOVAL;  Surgeon: Jalene Mullet, MD;  Location: [place];  Service: Ophthalmology;  Laterality: Right;    Family History  Problem Relation Age of Onset   Diabetes Mother    Hypertension Mother    Diabetes Father    Heart attack Father    Diabetes Sister    Heart attack Maternal Grandmother     No Known Allergies  Outpatient Medications Prior to Visit  Medication Sig Dispense Refill   Accu-Chek Softclix Lancets lancets Use to check blood sugar three times daily E11.65 100 each 5   albuterol (VENTOLIN HFA) 108 (90 Base) MCG/ACT inhaler Inhale 1 to 2 puffs by mouth into the lungs every 6 hours as needed for wheezing or shortness of breath. 18 g 0   aspirin 81 MG EC tablet Take 1 tablet (81 mg total) by mouth daily. Swallow whole. 30 tablet 2   Blood Glucose Monitoring Suppl (ACCU-CHEK GUIDE) w/Device KIT Use to check blood sugar three times daily E11.65 1 kit 0   carvedilol (COREG) 12.5 MG tablet TAKE 1 TABLET (12.5 MG TOTAL) BY MOUTH 2 (TWO) TIMES DAILY WITH A MEAL. 60 tablet 6   cetirizine (ZYRTEC) 10 MG tablet TAKE 1 TABLET (10 MG TOTAL) BY MOUTH DAILY. 30 tablet 0   Elastic Bandages & Supports (T.E.D. KNEE LENGTH/S-LONG) MISC 1 (one) each daily, apply compression stockings daily to  help decrease leg swelling 1 each 0   ferrous sulfate 325 (65 FE) MG tablet Take 1 tablet (325 mg total) by mouth 2 (two) times daily with a meal. 60 tablet 3   furosemide (LASIX) 40 MG tablet Take 1 tablet (40 mg total) by mouth 2 (two) times daily. 60 tablet 6   gabapentin (NEURONTIN) 300 MG capsule Take 2 capsules (600 mg total) by mouth at bedtime. (Patient taking differently: Take 300 mg by mouth at bedtime.) 60 capsule 6   glucose blood (ACCU-CHEK GUIDE) test strip Use to check blood sugar three times daily E11.65 100 each 12   hydrALAZINE (APRESOLINE) 25 MG tablet Take 1 tablet (25 mg total) by mouth 3 (three) times daily. 90 tablet 6   insulin aspart (NOVOLOG FLEXPEN) 100 UNIT/ML FlexPen Inject 0-9 Units into the skin See admin instructions. Inject 0-9 units into the skin 3 times a  day with meals, PER SLIDING SCALE:  CBG 70 - 120 =  0 unit CBG 121 - 150: 1 unit  CBG 151 - 200: 2 units  CBG 201 - 250: 3 units  CBG 251 - 300: 5 units  CBG 301 - 350: 7 units  CBG 351 - 400 9 units 3 mL 1   Insulin Pen Needle (BD PEN NEEDLE NANO U/F) 32G X 4 MM MISC USE TO INJECT LANTUS DAILY. MUST USE NEW PEN NEEDLE WITH EACH INJECTION. 100 each 0   isosorbide mononitrate (IMDUR) 30 MG 24 hr tablet Take 1 tablet (30 mg total) by mouth daily. 30 tablet 6   LANTUS SOLOSTAR 100 UNIT/ML Solostar Pen Inject 35 Units into the skin daily. (Patient taking differently: Inject 35 Units into the skin at bedtime.) 30 mL 6   magnesium oxide (MAG-OX) 400 MG tablet Take 1 tablet (400 mg total) by mouth daily. 15 tablet 0   ondansetron (ZOFRAN) 4 MG tablet Take 1 tablet (4 mg total) by mouth every 6 (six) hours as needed for nausea or vomiting. 12 tablet 0   oxyCODONE-acetaminophen (PERCOCET/ROXICET) 5-325 MG tablet Take 1 tablet by mouth every 6 (six) hours as needed. 20 tablet 0   potassium chloride SA (KLOR-CON M) 20 MEQ tablet Take 2 tablets (40 mEq total) by mouth daily. 30 tablet 6   rosuvastatin (CRESTOR) 40 MG  tablet TAKE 1 TABLET (40 MG TOTAL) BY MOUTH DAILY. 90 tablet 1   tamsulosin (FLOMAX) 0.4 MG CAPS capsule Take 1 capsule by mouth nightly, Stop if it causes dizziness 30 capsule 3   No facility-administered medications prior to visit.     ROS Review of Systems *** Objective:  There were no vitals taken for this visit.  BP/Weight 11/26/2021 11/24/2021 56/15/3794  Systolic BP 327 614 709  Diastolic BP 86 295 70  Wt. (Lbs) 215 222.9 217.8  BMI 33.67 34.91 34.11      Physical Exam *** CMP Latest Ref Rng & Units 11/24/2021 11/09/2021 10/07/2021  Glucose 70 - 99 mg/dL 124(H) 264(H) 62(L)  BUN 6 - 20 mg/dL 25(H) 32(H) 18  Creatinine 0.61 - 1.24 mg/dL 2.04(H) 2.32(H) 2.10(H)  Sodium 135 - 145 mmol/L 136 137 138  Potassium 3.5 - 5.1 mmol/L 3.6 4.0 3.6  Chloride 98 - 111 mmol/L 107 102 106  CO2 22 - 32 mmol/L '22 23 26  ' Calcium 8.9 - 10.3 mg/dL 8.8(L) 8.7 9.1  Total Protein 6.5 - 8.1 g/dL 6.7 - -  Total Bilirubin 0.3 - 1.2 mg/dL 0.7 - -  Alkaline Phos 38 - 126 U/L 63 - -  AST 15 - 41 U/L 23 - -  ALT 0 - 44 U/L 19 - -    Lipid Panel     Component Value Date/Time   CHOL 201 (H) 07/02/2021 0037   CHOL 137 04/25/2019 0925   TRIG 225 (H) 07/02/2021 0037   HDL 36 (L) 07/02/2021 0037   HDL 36 (L) 04/25/2019 0925   CHOLHDL 5.6 07/02/2021 0037   VLDL 45 (H) 07/02/2021 0037   LDLCALC 120 (H) 07/02/2021 0037   LDLCALC 57 04/25/2019 0925    CBC    Component Value Date/Time   WBC 5.1 11/24/2021 1159   RBC 4.55 11/24/2021 1159   HGB 11.4 (L) 11/24/2021 1159   HCT 37.3 (L) 11/24/2021 1159   PLT 271 11/24/2021 1159   MCV 82.0 11/24/2021 1159   MCH 25.1 (L) 11/24/2021 1159   MCHC 30.6 11/24/2021 1159  RDW 14.6 11/24/2021 1159   LYMPHSABS 1.1 08/06/2021 1737   MONOABS 0.4 08/06/2021 1737   EOSABS 0.1 08/06/2021 1737   BASOSABS 0.0 08/06/2021 1737    Lab Results  Component Value Date   HGBA1C 7.6 (A) 10/20/2021    Assessment & Plan:  There are no diagnoses linked to  this encounter.  Health Care Maintenance: *** No orders of the defined types were placed in this encounter.   Follow-up: No follow-ups on file.       Charlott Rakes, MD, FAAFP. [facility] and [place], [place]   01/24/2022, 9:04 AM

## 2022-01-25 ENCOUNTER — Encounter: Payer: Medicare Other | Admitting: Student

## 2022-01-25 DIAGNOSIS — E1165 Type 2 diabetes mellitus with hyperglycemia: Secondary | ICD-10-CM

## 2022-01-25 DIAGNOSIS — I5022 Chronic systolic (congestive) heart failure: Secondary | ICD-10-CM

## 2022-01-25 DIAGNOSIS — I428 Other cardiomyopathies: Secondary | ICD-10-CM

## 2022-01-25 DIAGNOSIS — I10 Essential (primary) hypertension: Secondary | ICD-10-CM

## 2022-01-31 ENCOUNTER — Encounter: Payer: Medicare Other | Admitting: Surgery

## 2022-02-04 ENCOUNTER — Other Ambulatory Visit: Payer: Self-pay

## 2022-02-07 ENCOUNTER — Encounter: Payer: Medicare Other | Admitting: Surgery

## 2022-02-14 NOTE — Progress Notes (Unsigned)
Electrophysiology Office Note Date: 02/14/2022  ID:  Jeffrey [name], DOB June 18, 1966, MRN [id number]  PCP: Charlott Rakes, MD Primary Cardiologist: Mertie Moores, MD Electrophysiologist: Virl Axe, MD   CC: Barostim titration   Jeffrey [name] is a 56 y.o. male seen today for barostim titration.  His device was implanted 11/26/2021 and he has missed multiple visits for titration.   He has been doing *** since implantation. The patients goals for barostim titration are ***   Today ***  Device History: Barostim (standard) implanted 11/26/2021 for Chronic systolic CHF Medtronic Single Chamber ICD implanted 01/2018 for NICM and syncope History of appropriate therapy: No History of AAD therapy: No    Past Medical History:  Diagnosis Date   AICD (automatic cardioverter/defibrillator) present    Medtronic   AICD (automatic cardioverter/defibrillator) present    MDT Visia AF MRI   Anemia    CAD (coronary artery disease)    a. cath 01/31/17: 60% 1st RPLB, 60% dist RCA, 55% prox RCA, 10% pro LAD --> Rx TX.    CHF (congestive heart failure) (HCC)    Chronic systolic CHF (congestive heart failure) ([place]) 01/28/2017   1. Echo 01/29/17:  EF 20-25, normal wall motion, mild LAE // 2. EF 10-15 by [facility] 01/2017    Coronary artery disease    Diabetes mellitus    type II   Diabetes mellitus without complication ([place])    Diabetic foot infection ([place]) 03/2016   RT FOOT   Dyspnea    History of kidney stones    passed   History of kidney stones    HTN (hypertension)    Hyperlipidemia    Hypertension    Myocardial infarction [facility]), although reported, NICM at cath    NICM (nonischemic cardiomyopathy) ([place]) 02/15/2017   1. Mod non-obs CAD on LHC in 01/2017 - CAD does not explain cardiomyopathy   Renal disorder    Past Surgical History:  Procedure Laterality Date   AIR/FLUID EXCHANGE Right 07/14/2020   Procedure: AIR/FLUID EXCHANGE;  Surgeon: Jalene Mullet, MD;  Location: [place];   Service: Ophthalmology;  Laterality: Right;   AMPUTATION Right 04/01/2016   Procedure: Right Great Toe Amputation;  Surgeon: Newt Minion, MD;  Location: [place];  Service: Orthopedics;  Laterality: Right;   AMPUTATION Right 06/19/2016   Procedure: AMPUTATION SECOND TOE;  Surgeon: Marybelle Killings, MD;  Location: [place];  Service: Orthopedics;  Laterality: Right;   BACK SURGERY     for abscess   CARDIAC CATHETERIZATION     ICD IMPLANT N/A 01/15/2018   Procedure: ICD IMPLANT;  Surgeon: Deboraha Sprang, MD;  Location: [place] [facility];  Service: Cardiovascular;  Laterality: N/A;   INJECTION OF SILICONE OIL Right 02/11/2024   Procedure: INJECTION OF SILICONE OIL;  Surgeon: Jalene Mullet, MD;  Location: [place];  Service: Ophthalmology;  Laterality: Right;   INJECTION OF SILICONE OIL Right 04/07/622   Procedure: INJECTION OF SILICONE OIL;  Surgeon: Jalene Mullet, MD;  Location: [place];  Service: Ophthalmology;  Laterality: Right;   INSERT / REPLACE / REMOVE PACEMAKER     MEMBRANE PEEL Right 08/25/2020   Procedure: MEMBRANE PEEL;  Surgeon: Jalene Mullet, MD;  Location: [place];  Service: Ophthalmology;  Laterality: Right;   PARS PLANA VITRECTOMY Right 07/14/2020   Procedure: PARS PLANA VITRECTOMY WITH 25 GAUGE, Membranetomy, drainage of subretinal fluid;  Surgeon: Jalene Mullet, MD;  Location: [place];  Service: Ophthalmology;  Laterality: Right;   PARS PLANA  VITRECTOMY Right 08/25/2020   Procedure: PARS PLANA VITRECTOMY WITH 25 GAUGE;  Surgeon: Jalene Mullet, MD;  Location: [place];  Service: Ophthalmology;  Laterality: Right;   PHOTOCOAGULATION WITH LASER Right 07/14/2020   Procedure: PHOTOCOAGULATION WITH LASER;  Surgeon: Jalene Mullet, MD;  Location: [place];  Service: Ophthalmology;  Laterality: Right;   PHOTOCOAGULATION WITH LASER Right 08/25/2020   Procedure: PHOTOCOAGULATION WITH LASER;  Surgeon: Jalene Mullet, MD;  Location: [place];  Service: Ophthalmology;  Laterality: Right;   REPAIR OF COMPLEX  TRACTION RETINAL DETACHMENT Right 08/25/2020   Procedure: REPAIR OF HEMORRHAGIC DETACHMENT;  Surgeon: Jalene Mullet, MD;  Location: [place];  Service: Ophthalmology;  Laterality: Right;   RIGHT/LEFT HEART CATH AND CORONARY ANGIOGRAPHY N/A 01/31/2017   Procedure: Right/Left Heart Cath and Coronary Angiography;  Surgeon: Troy Sine, MD;  Location: [place] [facility];  Service: Cardiovascular;  Laterality: N/A;   SILICON OIL REMOVAL Right 0/34/7425   Procedure: SILICON OIL REMOVAL;  Surgeon: Jalene Mullet, MD;  Location: [place];  Service: Ophthalmology;  Laterality: Right;    Current Outpatient Medications  Medication Sig Dispense Refill   Accu-Chek Softclix Lancets lancets Use to check blood sugar three times daily E11.65 100 each 5   albuterol (VENTOLIN HFA) 108 (90 Base) MCG/ACT inhaler Inhale 1 to 2 puffs by mouth into the lungs every 6 hours as needed for wheezing or shortness of breath. 18 g 0   aspirin 81 MG EC tablet Take 1 tablet (81 mg total) by mouth daily. Swallow whole. 30 tablet 2   Blood Glucose Monitoring Suppl (ACCU-CHEK GUIDE) w/Device KIT Use to check blood sugar three times daily E11.65 1 kit 0   carvedilol (COREG) 12.5 MG tablet TAKE 1 TABLET (12.5 MG TOTAL) BY MOUTH 2 (TWO) TIMES DAILY WITH A MEAL. 60 tablet 6   cetirizine (ZYRTEC) 10 MG tablet TAKE 1 TABLET (10 MG TOTAL) BY MOUTH DAILY. 30 tablet 0   Elastic Bandages & Supports (T.E.D. KNEE LENGTH/S-LONG) MISC 1 (one) each daily, apply compression stockings daily to help decrease leg swelling 1 each 0   ferrous sulfate 325 (65 FE) MG tablet Take 1 tablet (325 mg total) by mouth 2 (two) times daily with a meal. 60 tablet 3   furosemide (LASIX) 40 MG tablet Take 1 tablet (40 mg total) by mouth 2 (two) times daily. 60 tablet 6   gabapentin (NEURONTIN) 300 MG capsule Take 2 capsules (600 mg total) by mouth at bedtime. (Patient taking differently: Take 300 mg by mouth at bedtime.) 60 capsule 6   glucose blood (ACCU-CHEK GUIDE)  test strip Use to check blood sugar three times daily E11.65 100 each 12   hydrALAZINE (APRESOLINE) 25 MG tablet Take 1 tablet (25 mg total) by mouth 3 (three) times daily. 90 tablet 6   insulin aspart (NOVOLOG FLEXPEN) 100 UNIT/ML FlexPen Inject 0-9 Units into the skin See admin instructions. Inject 0-9 units into the skin 3 times a day with meals, PER SLIDING SCALE:  CBG 70 - 120 =  0 unit CBG 121 - 150: 1 unit  CBG 151 - 200: 2 units  CBG 201 - 250: 3 units  CBG 251 - 300: 5 units  CBG 301 - 350: 7 units  CBG 351 - 400 9 units 3 mL 1   Insulin Pen Needle (BD PEN NEEDLE NANO U/F) 32G X 4 MM MISC USE TO INJECT LANTUS DAILY. MUST USE NEW PEN NEEDLE WITH EACH INJECTION. 100 each 0   isosorbide mononitrate (  IMDUR) 30 MG 24 hr tablet Take 1 tablet (30 mg total) by mouth daily. 30 tablet 6   LANTUS SOLOSTAR 100 UNIT/ML Solostar Pen Inject 35 Units into the skin daily. (Patient taking differently: Inject 35 Units into the skin at bedtime.) 30 mL 6   magnesium oxide (MAG-OX) 400 MG tablet Take 1 tablet (400 mg total) by mouth daily. 15 tablet 0   ondansetron (ZOFRAN) 4 MG tablet Take 1 tablet (4 mg total) by mouth every 6 (six) hours as needed for nausea or vomiting. 12 tablet 0   oxyCODONE-acetaminophen (PERCOCET/ROXICET) 5-325 MG tablet Take 1 tablet by mouth every 6 (six) hours as needed. 20 tablet 0   potassium chloride SA (KLOR-CON M) 20 MEQ tablet Take 2 tablets (40 mEq total) by mouth daily. 30 tablet 6   rosuvastatin (CRESTOR) 40 MG tablet TAKE 1 TABLET (40 MG TOTAL) BY MOUTH DAILY. 90 tablet 1   tamsulosin (FLOMAX) 0.4 MG CAPS capsule Take 1 capsule by mouth nightly, Stop if it causes dizziness 30 capsule 3   No current facility-administered medications for this visit.    Allergies:   Patient has no known allergies.   Social History: Social History   Socioeconomic History   Marital status: Significant Other    Spouse name: Significant other [name]   Number of children: 1   Years  of education: Not on file   Highest education level: Not on file  Occupational History   Not on file  Tobacco Use   Smoking status: Never   Smokeless tobacco: Never  Vaping Use   Vaping Use: Never used  Substance and Sexual Activity   Alcohol use: Never   Drug use: Never   Sexual activity: Not Currently  Other Topics Concern   Not on file  Social History Narrative   ** Merged History Encounter **       Social Determinants of Health   Financial Resource Strain: Not on file  Food Insecurity: Not on file  Transportation Needs: Not on file  Physical Activity: Not on file  Stress: Not on file  Social Connections: Not on file  Intimate Partner Violence: Not on file    Family History: Family History  Problem Relation Age of Onset   Diabetes Mother    Hypertension Mother    Diabetes Father    Heart attack Father    Diabetes Sister    Heart attack Maternal Grandmother      Review of Systems: All other systems reviewed and are otherwise negative except as noted above.  Physical Exam: There were no vitals filed for this visit.   GEN- The patient is well appearing, alert and oriented x 3 today.   HEENT: normocephalic, atraumatic; sclera clear, conjunctiva pink; hearing intact; oropharynx clear; neck supple  Lungs- Clear to ausculation bilaterally, normal work of breathing.  No wheezes, rales, rhonchi Heart- Regular rate and rhythm, no murmurs, rubs or gallops  GI- soft, non-tender, non-distended, bowel sounds present  Extremities- no clubbing, cyanosis, or edema  MS- no significant deformity or atrophy Skin- warm and dry, no rash or lesion; PPM pocket well healed Psych- euthymic mood, full affect Neuro- strength and sensation are intact  Barostim Interrogation- Performed personally and reviewed in detail today,  See scanned report  EKG:  EKG is not ordered today.  Recent Labs: 03/30/2021: NT-Pro BNP 290 07/01/2021: TSH 0.709 10/20/2021: BNP 70.6 11/09/2021:  Magnesium 1.6 11/24/2021: ALT 19; BUN 25; Creatinine, Ser 2.04; Hemoglobin 11.4; Platelets 271; Potassium 3.6; Sodium  136   Wt Readings from Last 3 Encounters:  11/26/21 215 lb (97.5 kg)  11/24/21 222 lb 14.4 oz (101.1 kg)  11/09/21 217 lb 12.8 oz (98.8 kg)     Other studies Reviewed: Additional studies/ records that were reviewed today include: EP office notes  Assessment and Plan:  1. Chronic systolic CHF s/p Medtronic ICD and Barostim implantation NYHA *** symptoms.   Device titrated from *** millamp to *** milliamp by increments of 0.4 with good blood pressure response and no stimulation.  Device impedence stable. Pt goals are to ***  Normal device function See scanned report. Will follow up in *** weeks to continue titration with goal of 6-8 milliamps for chronic settings.   2. HTN Stable on current regimen   3. Non-compliance Stressed importance of follow up   4. Uncontrolled diabetes Hgb A1c 14.2 in July; I don't see one since.  Current medicines are reviewed at length with the patient today.   The patient {ACTIONS; HAS/DOES NOT HAVE:19233} concerns regarding his medicines.  The following changes were made today:  none  Labs/ tests ordered today include:  No orders of the defined types were placed in this encounter.   Disposition:   Follow up with {Blank single:19197::"Dr. Allred","Dr. Arlan Organ. Klein","Dr. Camnitz","EP APP"} in 4 {Blank single:19197::"Months","Weeks"} for further barostim titration.   Jacalyn Lefevre, PA-C  02/14/2022 9:20 AM  [facility] [address] [place] [address] [phone] (office) [phone] (fax)

## 2022-02-21 ENCOUNTER — Encounter: Payer: Medicare Other | Admitting: Student

## 2022-02-21 DIAGNOSIS — I110 Hypertensive heart disease with heart failure: Secondary | ICD-10-CM

## 2022-02-21 DIAGNOSIS — I428 Other cardiomyopathies: Secondary | ICD-10-CM

## 2022-02-21 DIAGNOSIS — I5022 Chronic systolic (congestive) heart failure: Secondary | ICD-10-CM

## 2022-02-21 DIAGNOSIS — Z91199 Patient's noncompliance with other medical treatment and regimen due to unspecified reason: Secondary | ICD-10-CM

## 2022-02-22 ENCOUNTER — Encounter: Payer: Medicare Other | Admitting: Internal Medicine

## 2022-02-24 ENCOUNTER — Other Ambulatory Visit: Payer: Self-pay | Admitting: Family Medicine

## 2022-02-24 ENCOUNTER — Other Ambulatory Visit: Payer: Self-pay

## 2022-02-24 MED ORDER — NOVOLOG FLEXPEN 100 UNIT/ML ~~LOC~~ SOPN
0.0000 [IU] | PEN_INJECTOR | SUBCUTANEOUS | 0 refills | Status: DC
  Filled 2022-02-24: qty 3, 11d supply, fill #0

## 2022-02-25 ENCOUNTER — Other Ambulatory Visit: Payer: Self-pay

## 2022-02-28 ENCOUNTER — Other Ambulatory Visit: Payer: Self-pay

## 2022-03-01 ENCOUNTER — Other Ambulatory Visit: Payer: Self-pay

## 2022-03-04 ENCOUNTER — Other Ambulatory Visit: Payer: Self-pay

## 2022-03-06 ENCOUNTER — Encounter (HOSPITAL_COMMUNITY): Payer: Self-pay | Admitting: Emergency Medicine

## 2022-03-06 ENCOUNTER — Other Ambulatory Visit: Payer: Self-pay

## 2022-03-06 ENCOUNTER — Emergency Department (HOSPITAL_COMMUNITY)
Admission: EM | Admit: 2022-03-06 | Discharge: 2022-03-06 | Disposition: A | Discharge status: Home / Self Care | Payer: Medicare Other | Attending: Emergency Medicine | Admitting: Emergency Medicine

## 2022-03-06 DIAGNOSIS — E162 Hypoglycemia, unspecified: Secondary | ICD-10-CM | POA: Insufficient documentation

## 2022-03-06 DIAGNOSIS — Z7982 Long term (current) use of aspirin: Secondary | ICD-10-CM | POA: Insufficient documentation

## 2022-03-06 DIAGNOSIS — Z794 Long term (current) use of insulin: Secondary | ICD-10-CM | POA: Diagnosis not present

## 2022-03-06 LAB — CBC WITH DIFFERENTIAL/PLATELET
Abs Immature Granulocytes: 0.01 10*3/uL (ref 0.00–0.07)
Basophils Absolute: 0 10*3/uL (ref 0.0–0.1)
Basophils Relative: 0 %
Eosinophils Absolute: 0 10*3/uL (ref 0.0–0.5)
Eosinophils Relative: 0 %
HCT: 34.8 % — ABNORMAL LOW (ref 39.0–52.0)
Hemoglobin: 10.7 g/dL — ABNORMAL LOW (ref 13.0–17.0)
Immature Granulocytes: 0 %
Lymphocytes Relative: 11 %
Lymphs Abs: 0.8 10*3/uL (ref 0.7–4.0)
MCH: 25.5 pg — ABNORMAL LOW (ref 26.0–34.0)
MCHC: 30.7 g/dL (ref 30.0–36.0)
MCV: 82.9 fL (ref 80.0–100.0)
Monocytes Absolute: 0.4 10*3/uL (ref 0.1–1.0)
Monocytes Relative: 6 %
Neutro Abs: 6 10*3/uL (ref 1.7–7.7)
Neutrophils Relative %: 83 %
Platelets: 243 10*3/uL (ref 150–400)
RBC: 4.2 MIL/uL — ABNORMAL LOW (ref 4.22–5.81)
RDW: 15 % (ref 11.5–15.5)
WBC: 7.3 10*3/uL (ref 4.0–10.5)
nRBC: 0 % (ref 0.0–0.2)

## 2022-03-06 LAB — BASIC METABOLIC PANEL
Anion gap: 6 (ref 5–15)
BUN: 20 mg/dL (ref 6–20)
CO2: 25 mmol/L (ref 22–32)
Calcium: 8.5 mg/dL — ABNORMAL LOW (ref 8.9–10.3)
Chloride: 105 mmol/L (ref 98–111)
Creatinine, Ser: 2.11 mg/dL — ABNORMAL HIGH (ref 0.61–1.24)
GFR, Estimated: 36 mL/min — ABNORMAL LOW (ref >60)
Glucose, Bld: 276 mg/dL — ABNORMAL HIGH (ref 70–99)
Potassium: 3 mmol/L — ABNORMAL LOW (ref 3.5–5.1)
Sodium: 136 mmol/L (ref 135–145)

## 2022-03-06 LAB — CBG MONITORING, ED
Glucose-Capillary: 80 mg/dL (ref 70–99)
Glucose-Capillary: 87 mg/dL (ref 70–99)

## 2022-03-06 NOTE — ED Triage Notes (Signed)
Pt BIB [facility] from home, pt hypoglycemic at 42, given 25g D10 with improvement to 128. A&Ox4, per family pt takes insulin but doesn't always eat. EMS VSS. ?

## 2022-03-06 NOTE — Discharge Instructions (Signed)
This is not your first trip to the emergency department for low blood sugar.  The most common issue is that people take their insulin and do not eat appropriately.  It may be worthwhile to hold your insulin until you talk with your family doctor and see if they can think of a safer regiment for you to be on. ?

## 2022-03-06 NOTE — ED Notes (Signed)
Pt was given soda for his blood sugar  ?

## 2022-03-06 NOTE — ED Provider Notes (Signed)
?[facility] ?Provider Note ? ? ?CSN: [id number] ?Arrival date & time: 03/06/22  0014 ? ?  ? ?History ? ?Chief Complaint  ?Patient presents with  ? Hypoglycemia  ? ? ?KRISTOFF COONRADT is a 56 y.o. male. ? ?56 yo M with a cc of hypoglycemia.  Has had an issue with this in the past.  He does not provide much history during questioning.  Nods yes or no.  Denies any chest pain shortness of breath cough or fever.  Feels like he has been eating and drinking okay. ? ? ?Hypoglycemia ? ?  ? ?Home Medications ?Prior to Admission medications   ?Medication Sig Start Date End Date Taking? Authorizing Provider  ?insulin aspart (NOVOLOG FLEXPEN) 100 UNIT/ML FlexPen Inject 0-9 Units into the skin See admin instructions. Inject 0-9 units into the skin 3 times a day with meals, PER SLIDING SCALE:  ?CBG 70 - 120 =  0 unit ?CBG 121 - 150: 1 unit  ?CBG 151 - 200: 2 units  ?CBG 201 - 250: 3 units  ?CBG 251 - 300: 5 units  ?CBG 301 - 350: 7 units  ?CBG 351 - 400 9 units 02/24/22   Charlott Rakes, MD  ?Accu-Chek Softclix Lancets lancets Use to check blood sugar three times daily E11.65 08/12/21   Charlott Rakes, MD  ?albuterol (VENTOLIN HFA) 108 (90 Base) MCG/ACT inhaler Inhale 1 to 2 puffs by mouth into the lungs every 6 hours as needed for wheezing or shortness of breath. 10/07/21   Argentina Donovan, PA-C  ?aspirin 81 MG EC tablet Take 1 tablet (81 mg total) by mouth daily. Swallow whole. 07/03/21   Mariel Aloe, MD  ?Blood Glucose Monitoring Suppl (ACCU-CHEK GUIDE) w/Device KIT Use to check blood sugar three times daily E11.65 08/12/21   Charlott Rakes, MD  ?carvedilol (COREG) 12.5 MG tablet TAKE 1 TABLET (12.5 MG TOTAL) BY MOUTH 2 (TWO) TIMES DAILY WITH A MEAL. 10/20/21   Charlott Rakes, MD  ?cetirizine (ZYRTEC) 10 MG tablet TAKE 1 TABLET (10 MG TOTAL) BY MOUTH DAILY. 07/27/20   Charlott Rakes, MD  ?Elastic Bandages & Supports (T.E.D. KNEE LENGTH/S-LONG) MISC 1 (one) each daily, apply compression  stockings daily to help decrease leg swelling 11/11/21     ?ferrous sulfate 325 (65 FE) MG tablet Take 1 tablet (325 mg total) by mouth 2 (two) times daily with a meal. 04/03/16   Dhungel, Nishant, MD  ?furosemide (LASIX) 40 MG tablet Take 1 tablet (40 mg total) by mouth 2 (two) times daily. 10/20/21 10/20/22  Charlott Rakes, MD  ?gabapentin (NEURONTIN) 300 MG capsule Take 2 capsules (600 mg total) by mouth at bedtime. ?Patient taking differently: Take 300 mg by mouth at bedtime. 10/20/21 10/20/22  Charlott Rakes, MD  ?glucose blood (ACCU-CHEK GUIDE) test strip Use to check blood sugar three times daily E11.65 08/12/21   Charlott Rakes, MD  ?hydrALAZINE (APRESOLINE) 25 MG tablet Take 1 tablet (25 mg total) by mouth 3 (three) times daily. 10/20/21 10/20/22  Charlott Rakes, MD  ?Insulin Pen Needle (BD PEN NEEDLE NANO U/F) 32G X 4 MM MISC USE TO INJECT LANTUS DAILY. MUST USE NEW PEN NEEDLE WITH EACH INJECTION. 10/07/21   Argentina Donovan, PA-C  ?isosorbide mononitrate (IMDUR) 30 MG 24 hr tablet Take 1 tablet (30 mg total) by mouth daily. 10/20/21   Charlott Rakes, MD  ?LANTUS SOLOSTAR 100 UNIT/ML Solostar Pen Inject 35 Units into the skin daily. ?Patient taking differently: Inject 35 Units  into the skin at bedtime. 10/20/21   Charlott Rakes, MD  ?magnesium oxide (MAG-OX) 400 MG tablet Take 1 tablet (400 mg total) by mouth daily. 08/07/21   Shelly Coss, MD  ?ondansetron (ZOFRAN) 4 MG tablet Take 1 tablet (4 mg total) by mouth every 6 (six) hours as needed for nausea or vomiting. 83/38/25   Delora Fuel, MD  ?oxyCODONE-acetaminophen (PERCOCET/ROXICET) 5-325 MG tablet Take 1 tablet by mouth every 6 (six) hours as needed. 11/26/21   Ulyses Amor, PA-C  ?potassium chloride SA (KLOR-CON M) 20 MEQ tablet Take 2 tablets (40 mEq total) by mouth daily. 10/20/21   Charlott Rakes, MD  ?rosuvastatin (CRESTOR) 40 MG tablet TAKE 1 TABLET (40 MG TOTAL) BY MOUTH DAILY. 10/20/21 10/20/22  Charlott Rakes, MD  ?tamsulosin (FLOMAX) 0.4 MG  CAPS capsule Take 1 capsule by mouth nightly, Stop if it causes dizziness 11/11/21     ?   ? ?Allergies    ?Patient has no known allergies.   ? ?Review of Systems   ?Review of Systems ? ?Physical Exam ?Updated Vital Signs ?BP (!) 160/102   Pulse 68   Temp 97.8 ?F (36.6 ?C) (Oral)   Resp (!) 21   SpO2 96%  ?Physical Exam ?Vitals and nursing note reviewed.  ?Constitutional:   ?   Appearance: He is well-developed.  ?HENT:  ?   Head: Normocephalic and atraumatic.  ?Eyes:  ?   Pupils: Pupils are equal, round, and reactive to light.  ?Neck:  ?   Vascular: No JVD.  ?Cardiovascular:  ?   Rate and Rhythm: Normal rate and regular rhythm.  ?   Heart sounds: No murmur heard. ?  No friction rub. No gallop.  ?Pulmonary:  ?   Effort: No respiratory distress.  ?   Breath sounds: No wheezing.  ?Abdominal:  ?   General: There is no distension.  ?   Tenderness: There is no abdominal tenderness. There is no guarding or rebound.  ?Musculoskeletal:     ?   General: Normal range of motion.  ?   Cervical back: Normal range of motion and neck supple.  ?Skin: ?   Coloration: Skin is not pale.  ?   Findings: No rash.  ?Neurological:  ?   Mental Status: He is alert and oriented to person, place, and time.  ?Psychiatric:     ?   Behavior: Behavior normal.  ? ? ?ED Results / Procedures / Treatments   ?Labs ?(all labs ordered are listed, but only abnormal results are displayed) ?Labs Reviewed  ?CBC WITH DIFFERENTIAL/PLATELET - Abnormal; Notable for the following components:  ?    Result Value  ? RBC 4.20 (*)   ? Hemoglobin 10.7 (*)   ? HCT 34.8 (*)   ? MCH 25.5 (*)   ? All other components within normal limits  ?BASIC METABOLIC PANEL - Abnormal; Notable for the following components:  ? Potassium 3.0 (*)   ? Glucose, Bld 276 (*)   ? Creatinine, Ser 2.11 (*)   ? Calcium 8.5 (*)   ? GFR, Estimated 36 (*)   ? All other components within normal limits  ?CBG MONITORING, ED  ?CBG MONITORING, ED  ? ? ?EKG ?None ? ?Radiology ?No results  found. ? ?Procedures ?Procedures  ? ? ?Medications Ordered in ED ?Medications - No data to display ? ?ED Course/ Medical Decision Making/ A&P ?  ?                        ?  Medical Decision Making ?Amount and/or Complexity of Data Reviewed ?Labs: ordered. ? ? ?56 yo M with a chief complaints of hypoglycemia.  This happened to him multiple times in the past.  He feels like he has been eating and drinking okay.  She is nodding yes and no to me on exam.  We will have him eat and drink here.  Check blood work.  Reassess. ? ?Patient is feeling a bit better and would like to go home.  Repeat blood sugar continues to be normal.  We will discharge him.  With this being multiple events that he is presented for hyperglycemia suggest that he talk with his family doctor about perhaps changing his regimen. ? ?2:57 AM:  I have discussed the diagnosis/risks/treatment options with the patient and family.  Evaluation and diagnostic testing in the emergency department does not suggest an emergent condition requiring admission or immediate intervention beyond what has been performed at this time.  They will follow up with  PCP. We also discussed returning to the ED immediately if new or worsening sx occur. We discussed the sx which are most concerning (e.g., sudden worsening pain, fever, inability to tolerate by mouth) that necessitate immediate return. Medications administered to the patient during their visit and any new prescriptions provided to the patient are listed below. ? ?Medications given during this visit ?Medications - No data to display ? ? ?The patient appears reasonably screen and/or stabilized for discharge and I doubt any other medical condition or other [facility] requiring further screening, evaluation, or treatment in the ED at this time prior to discharge.  ? ? ? ? ? ? ? ? ?Final Clinical Impression(s) / ED Diagnoses ?Final diagnoses:  ?Hypoglycemia  ? ? ?Rx / DC Orders ?ED Discharge Orders   ? ? None  ? ?  ? ? ?  ?Deno Etienne, DO ?03/06/22 0257 ? ?

## 2022-03-06 NOTE — ED Notes (Signed)
CBG is 80 ?

## 2022-03-08 ENCOUNTER — Other Ambulatory Visit: Payer: Self-pay

## 2022-03-08 MED ORDER — D3-50 1.25 MG (50000 UT) PO CAPS
ORAL_CAPSULE | ORAL | 1 refills | Status: DC
  Filled 2022-03-08 – 2022-06-06 (×2): qty 4, 28d supply, fill #0

## 2022-03-09 ENCOUNTER — Encounter: Payer: Self-pay | Admitting: Internal Medicine

## 2022-03-09 ENCOUNTER — Telehealth: Payer: Self-pay

## 2022-03-09 ENCOUNTER — Ambulatory Visit (INDEPENDENT_AMBULATORY_CARE_PROVIDER_SITE_OTHER): Payer: Medicare Other | Admitting: Internal Medicine

## 2022-03-09 ENCOUNTER — Other Ambulatory Visit: Payer: Self-pay

## 2022-03-09 VITALS — BP 150/92 | HR 81 | Ht 67.0 in | Wt 222.2 lb

## 2022-03-09 DIAGNOSIS — I5022 Chronic systolic (congestive) heart failure: Secondary | ICD-10-CM

## 2022-03-09 DIAGNOSIS — Z95810 Presence of automatic (implantable) cardiac defibrillator: Secondary | ICD-10-CM

## 2022-03-09 DIAGNOSIS — I428 Other cardiomyopathies: Secondary | ICD-10-CM

## 2022-03-09 MED ORDER — POTASSIUM CHLORIDE CRYS ER 20 MEQ PO TBCR
40.0000 meq | EXTENDED_RELEASE_TABLET | Freq: Two times a day (BID) | ORAL | 3 refills | Status: DC
  Filled 2022-03-09: qty 180, 45d supply, fill #0

## 2022-03-09 NOTE — Telephone Encounter (Signed)
Per [place] Dec rep I ordered the patient a new monitor. He should receive it in 7-10 business days. ?

## 2022-03-09 NOTE — Patient Instructions (Addendum)
Medication Instructions:  ?Your physician has recommended you make the following change in your medication:  ? ? START taking potassium 20 meq-  Take 2 tablets (40 meq) by mouth  TWICE a day. ? ?Labwork: ?You will get lab work today:  BMP ? ?Testing/Procedures: ?None ordered. ? ?Follow-Up: ?Your physician wants you to follow-up in: one year with Dr. Caryl Comes or one of the following Advanced Practice Providers on your designated Care Team:   ?Tommye Standard, PA-C ?Legrand Como "Jonni Sanger" [place], PA-C ?You will receive a reminder letter in the mail two months in advance. If you don't receive a letter, please call our office to schedule the follow-up appointment. ? ? ?Remote monitoring is used to monitor your ICD from home. This monitoring reduces the number of office visits required to check your device to one time per year. It allows [place] to keep an eye on the functioning of your device to ensure it is working properly. You are scheduled for a device check from home on 03/30/2022. You may send your transmission at any time that day. If you have a wireless device, the transmission will be sent automatically. After your physician reviews your transmission, you will receive a postcard with your next transmission date. ? ?Any Other Special Instructions Will Be Listed Below (If Applicable). ? ?If you need a refill on your cardiac medications before your next appointment, please call your pharmacy.  ? ? ? ?

## 2022-03-09 NOTE — Progress Notes (Signed)
? ? ? ? ?Patient Care Team: ?Charlott Rakes, MD as PCP - General (Family Medicine) ?Nahser, Wonda Cheng, MD as PCP - Cardiology (Cardiology) ?Deboraha Sprang, MD as PCP - Electrophysiology (Cardiology) ?Charlott Rakes, MD (Family Medicine) ?Nahser, Wonda Cheng, MD (Cardiology) ?Deboraha Sprang, MD (Cardiology) ? ? ?HPI ? ?Jeffrey [name] is a 56 y.o. male ?Seen in follow-up for an ICD implanted 2/19 for syncope in the setting of nonischemic cardiomyopathy.  He has concomitant nonobstructive coronary disease. ? ?Seen in the ER earlier this weekend for hypoglycemia; potassium appreciably low.  Hemoglobin low.  Do not see that they were addressed. ? ?The patient denies chest pain , nocturnal dyspnea, orthopnea .  There have been no palpitations  or syncope.  Complains of extreme somnolence.  He has shortness of breath and fluid accumulation both in his belly and in his feet.  He reached out to nephrology who suggested increase his furosemide from 40--80 twice daily.  ? ?DATE TEST EF   ?2/18 LHC 10-15% CAD nonobstructive  ?2/18 Echo   20 %   ?2/19 Echo   20 % LAE//RV systolic dysfn  ?7/22 Echo  30-35%   ? ?Date Cr K Hgb  ?4/19 0.99 4.3 13.0  ? 5/20 1.39 3.8   ?3/23 2.11 3.0 10.7  ? ?  ? ?Past Medical History:  ?Diagnosis Date  ? AICD (automatic cardioverter/defibrillator) present   ? Medtronic  ? AICD (automatic cardioverter/defibrillator) present   ? MDT Visia AF MRI  ? Anemia   ? CAD (coronary artery disease)   ? a. cath 01/31/17: 60% 1st RPLB, 60% dist RCA, 55% prox RCA, 10% pro LAD --> Rx TX.   ? CHF (congestive heart failure) ([place])   ? Chronic systolic CHF (congestive heart failure) ([place]) 01/28/2017  ? 1. Echo 01/29/17:  EF 20-25, normal wall motion, mild LAE // 2. EF 10-15 by [facility] 01/2017   ? Coronary artery disease   ? Diabetes mellitus   ? type II  ? Diabetes mellitus without complication ([place])   ? Diabetic foot infection ([place]) 03/2016  ? RT FOOT  ? Dyspnea   ? History of kidney stones   ? passed  ? History of kidney  stones   ? HTN (hypertension)   ? Hyperlipidemia   ? Hypertension   ? Myocardial infarction [facility]), although reported, NICM at cath   ? NICM (nonischemic cardiomyopathy) ([place]) 02/15/2017  ? 1. Mod non-obs CAD on LHC in 01/2017 - CAD does not explain cardiomyopathy  ? Renal disorder   ? ? ?Past Surgical History:  ?Procedure Laterality Date  ? AIR/FLUID EXCHANGE Right 07/14/2020  ? Procedure: AIR/FLUID EXCHANGE;  Surgeon: Jalene Mullet, MD;  Location: [place];  Service: Ophthalmology;  Laterality: Right;  ? AMPUTATION Right 04/01/2016  ? Procedure: Right Great Toe Amputation;  Surgeon: Newt Minion, MD;  Location: [place] ;  Service: Orthopedics;  Laterality: Right;  ? AMPUTATION Right 06/19/2016  ? Procedure: AMPUTATION SECOND TOE;  Surgeon: Marybelle Killings, MD;  Location: [place];  Service: Orthopedics;  Laterality: Right;  ? BACK SURGERY    ? for abscess  ? CARDIAC CATHETERIZATION    ? ICD IMPLANT N/A 01/15/2018  ? Procedure: ICD IMPLANT;  Surgeon: Deboraha Sprang, MD;  Location: [place] [facility];  Service: Cardiovascular;  Laterality: N/A;  ? INJECTION OF SILICONE OIL Right 01/17/3334  ? Procedure: INJECTION OF SILICONE OIL;  Surgeon: Jalene Mullet, MD;  Location: [place];  Service: Ophthalmology;  Laterality: Right;  ? INJECTION OF SILICONE OIL Right 01/31/2541  ? Procedure: INJECTION OF SILICONE OIL;  Surgeon: Jalene Mullet, MD;  Location: [place];  Service: Ophthalmology;  Laterality: Right;  ? INSERT / REPLACE / REMOVE PACEMAKER    ? MEMBRANE PEEL Right 08/25/2020  ? Procedure: MEMBRANE PEEL;  Surgeon: Jalene Mullet, MD;  Location: [place];  Service: Ophthalmology;  Laterality: Right;  ? PARS PLANA VITRECTOMY Right 07/14/2020  ? Procedure: PARS PLANA VITRECTOMY WITH 25 GAUGE, Membranetomy, drainage of subretinal fluid;  Surgeon: Jalene Mullet, MD;  Location: [place];  Service: Ophthalmology;  Laterality: Right;  ? PARS PLANA VITRECTOMY Right 08/25/2020  ? Procedure: PARS PLANA VITRECTOMY WITH 25 GAUGE;  Surgeon: Jalene Mullet,  MD;  Location: [place];  Service: Ophthalmology;  Laterality: Right;  ? PHOTOCOAGULATION WITH LASER Right 07/14/2020  ? Procedure: PHOTOCOAGULATION WITH LASER;  Surgeon: Jalene Mullet, MD;  Location: [place];  Service: Ophthalmology;  Laterality: Right;  ? PHOTOCOAGULATION WITH LASER Right 08/25/2020  ? Procedure: PHOTOCOAGULATION WITH LASER;  Surgeon: Jalene Mullet, MD;  Location: [place];  Service: Ophthalmology;  Laterality: Right;  ? 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  ? Procedure: REPAIR OF HEMORRHAGIC DETACHMENT;  Surgeon: Jalene Mullet, MD;  Location: [place];  Service: Ophthalmology;  Laterality: Right;  ? RIGHT/LEFT HEART CATH AND CORONARY ANGIOGRAPHY N/A 01/31/2017  ? Procedure: Right/Left Heart Cath and Coronary Angiography;  Surgeon: Troy Sine, MD;  Location: [place] [facility];  Service: Cardiovascular;  Laterality: N/A;  ? SILICON OIL REMOVAL Right 08/25/2020  ? Procedure: SILICON OIL REMOVAL;  Surgeon: Jalene Mullet, MD;  Location: [place];  Service: Ophthalmology;  Laterality: Right;  ? ? ?Current Meds  ?Medication Sig  ? Accu-Chek Softclix Lancets lancets Use to check blood sugar three times daily E11.65  ? albuterol (VENTOLIN HFA) 108 (90 Base) MCG/ACT inhaler Inhale 1 to 2 puffs by mouth into the lungs every 6 hours as needed for wheezing or shortness of breath.  ? aspirin 81 MG EC tablet Take 1 tablet (81 mg total) by mouth daily. Swallow whole.  ? Blood Glucose Monitoring Suppl (ACCU-CHEK GUIDE) w/Device KIT Use to check blood sugar three times daily E11.65  ? carvedilol (COREG) 12.5 MG tablet TAKE 1 TABLET (12.5 MG TOTAL) BY MOUTH 2 (TWO) TIMES DAILY WITH A MEAL.  ? cetirizine (ZYRTEC) 10 MG tablet TAKE 1 TABLET (10 MG TOTAL) BY MOUTH DAILY.  ? Elastic Bandages & Supports (T.E.D. KNEE LENGTH/S-LONG) MISC 1 (one) each daily, apply compression stockings daily to help decrease leg swelling  ? ferrous sulfate 325 (65 FE) MG tablet Take 1 tablet (325 mg total) by mouth 2  (two) times daily with a meal.  ? furosemide (LASIX) 40 MG tablet Take 1 tablet (40 mg total) by mouth 2 (two) times daily.  ? gabapentin (NEURONTIN) 300 MG capsule Take 2 capsules (600 mg total) by mouth at bedtime.  ? glucose blood (ACCU-CHEK GUIDE) test strip Use to check blood sugar three times daily E11.65  ? hydrALAZINE (APRESOLINE) 25 MG tablet Take 1 tablet (25 mg total) by mouth 3 (three) times daily.  ? insulin aspart (NOVOLOG FLEXPEN) 100 UNIT/ML FlexPen Inject 0-9 Units into the skin See admin instructions. Inject 0-9 units into the skin 3 times a day with meals, PER SLIDING SCALE:  ?CBG 70 - 120 =  0 unit ?CBG 121 - 150: 1 unit  ?CBG 151 - 200: 2 units  ?CBG 201 - 250: 3 units  ?  CBG 251 - 300: 5 units  ?CBG 301 - 350: 7 units  ?CBG 351 - 400 9 units  ? Insulin Pen Needle (BD PEN NEEDLE NANO U/F) 32G X 4 MM MISC USE TO INJECT LANTUS DAILY. MUST USE NEW PEN NEEDLE WITH EACH INJECTION.  ? isosorbide mononitrate (IMDUR) 30 MG 24 hr tablet Take 1 tablet (30 mg total) by mouth daily.  ? LANTUS SOLOSTAR 100 UNIT/ML Solostar Pen Inject 35 Units into the skin daily. (Patient taking differently: Inject 35 Units into the skin at bedtime.)  ? magnesium oxide (MAG-OX) 400 MG tablet Take 1 tablet (400 mg total) by mouth daily.  ? ondansetron (ZOFRAN) 4 MG tablet Take 1 tablet (4 mg total) by mouth every 6 (six) hours as needed for nausea or vomiting.  ? potassium chloride SA (KLOR-CON M) 20 MEQ tablet Take 2 tablets (40 mEq total) by mouth daily.  ? rosuvastatin (CRESTOR) 40 MG tablet TAKE 1 TABLET (40 MG TOTAL) BY MOUTH DAILY.  ? Vitamin D, Ergocalciferol, (DRISDOL) 1.25 MG (50000 UNIT) CAPS capsule Take 1 capsule by mouth weekly.  ? ? ?No Known Allergies ? ? ? ?Review of Systems negative except from HPI and PMH ? ?Physical Exam ?BP (!) 150/92   Pulse 81   Ht '5\' 7"'  (1.702 m)   Wt 222 lb 3.2 oz (100.8 kg)   SpO2 97%   BMI 34.80 kg/m?  ?Well developed and well nourished in no acute distress ?HENT normal ?Neck  supple JVP to jaw ?Clear ?Device pocket well healed; without hematoma or erythema.  There is no tethering  ?Regular rate and rhythm, no murmur ?Abd-distended with active BS ?No Clubbing cyanosis 1+ edema ?Skin-w

## 2022-03-10 ENCOUNTER — Emergency Department (HOSPITAL_COMMUNITY): Payer: Medicare Other

## 2022-03-10 ENCOUNTER — Observation Stay (HOSPITAL_COMMUNITY): Payer: Medicare Other

## 2022-03-10 ENCOUNTER — Other Ambulatory Visit: Payer: Self-pay

## 2022-03-10 ENCOUNTER — Inpatient Hospital Stay (HOSPITAL_COMMUNITY)
Admission: EM | Admit: 2022-03-10 | Discharge: 2022-03-17 | DRG: 682 | Disposition: A | Discharge status: Home / Self Care | Payer: Medicare Other | Attending: Internal Medicine | Admitting: Internal Medicine

## 2022-03-10 ENCOUNTER — Encounter (HOSPITAL_COMMUNITY): Payer: Self-pay | Admitting: Emergency Medicine

## 2022-03-10 DIAGNOSIS — E1165 Type 2 diabetes mellitus with hyperglycemia: Secondary | ICD-10-CM

## 2022-03-10 DIAGNOSIS — E11649 Type 2 diabetes mellitus with hypoglycemia without coma: Secondary | ICD-10-CM | POA: Diagnosis not present

## 2022-03-10 DIAGNOSIS — E1140 Type 2 diabetes mellitus with diabetic neuropathy, unspecified: Secondary | ICD-10-CM | POA: Diagnosis present

## 2022-03-10 DIAGNOSIS — I129 Hypertensive chronic kidney disease with stage 1 through stage 4 chronic kidney disease, or unspecified chronic kidney disease: Secondary | ICD-10-CM

## 2022-03-10 DIAGNOSIS — Z20822 Contact with and (suspected) exposure to covid-19: Secondary | ICD-10-CM | POA: Diagnosis present

## 2022-03-10 DIAGNOSIS — Z794 Long term (current) use of insulin: Secondary | ICD-10-CM

## 2022-03-10 DIAGNOSIS — E785 Hyperlipidemia, unspecified: Secondary | ICD-10-CM | POA: Diagnosis present

## 2022-03-10 DIAGNOSIS — E162 Hypoglycemia, unspecified: Secondary | ICD-10-CM

## 2022-03-10 DIAGNOSIS — Z833 Family history of diabetes mellitus: Secondary | ICD-10-CM

## 2022-03-10 DIAGNOSIS — R296 Repeated falls: Secondary | ICD-10-CM

## 2022-03-10 DIAGNOSIS — D631 Anemia in chronic kidney disease: Secondary | ICD-10-CM | POA: Diagnosis present

## 2022-03-10 DIAGNOSIS — I130 Hypertensive heart and chronic kidney disease with heart failure and stage 1 through stage 4 chronic kidney disease, or unspecified chronic kidney disease: Secondary | ICD-10-CM | POA: Diagnosis present

## 2022-03-10 DIAGNOSIS — R55 Syncope and collapse: Secondary | ICD-10-CM | POA: Diagnosis present

## 2022-03-10 DIAGNOSIS — N183 Chronic kidney disease, stage 3 unspecified: Secondary | ICD-10-CM | POA: Diagnosis present

## 2022-03-10 DIAGNOSIS — E782 Mixed hyperlipidemia: Secondary | ICD-10-CM | POA: Diagnosis present

## 2022-03-10 DIAGNOSIS — I5042 Chronic combined systolic (congestive) and diastolic (congestive) heart failure: Secondary | ICD-10-CM | POA: Diagnosis not present

## 2022-03-10 DIAGNOSIS — Z87442 Personal history of urinary calculi: Secondary | ICD-10-CM

## 2022-03-10 DIAGNOSIS — W19XXXA Unspecified fall, initial encounter: Secondary | ICD-10-CM

## 2022-03-10 DIAGNOSIS — G9341 Metabolic encephalopathy: Secondary | ICD-10-CM | POA: Diagnosis present

## 2022-03-10 DIAGNOSIS — I5043 Acute on chronic combined systolic (congestive) and diastolic (congestive) heart failure: Secondary | ICD-10-CM | POA: Diagnosis present

## 2022-03-10 DIAGNOSIS — N179 Acute kidney failure, unspecified: Secondary | ICD-10-CM | POA: Diagnosis not present

## 2022-03-10 DIAGNOSIS — E876 Hypokalemia: Secondary | ICD-10-CM | POA: Diagnosis present

## 2022-03-10 DIAGNOSIS — I110 Hypertensive heart disease with heart failure: Secondary | ICD-10-CM

## 2022-03-10 DIAGNOSIS — I2510 Atherosclerotic heart disease of native coronary artery without angina pectoris: Secondary | ICD-10-CM | POA: Diagnosis present

## 2022-03-10 DIAGNOSIS — E1142 Type 2 diabetes mellitus with diabetic polyneuropathy: Secondary | ICD-10-CM

## 2022-03-10 DIAGNOSIS — E1121 Type 2 diabetes mellitus with diabetic nephropathy: Secondary | ICD-10-CM

## 2022-03-10 DIAGNOSIS — I428 Other cardiomyopathies: Secondary | ICD-10-CM | POA: Diagnosis present

## 2022-03-10 DIAGNOSIS — Z79899 Other long term (current) drug therapy: Secondary | ICD-10-CM

## 2022-03-10 DIAGNOSIS — Z7982 Long term (current) use of aspirin: Secondary | ICD-10-CM

## 2022-03-10 DIAGNOSIS — E1122 Type 2 diabetes mellitus with diabetic chronic kidney disease: Secondary | ICD-10-CM | POA: Diagnosis present

## 2022-03-10 DIAGNOSIS — R600 Localized edema: Secondary | ICD-10-CM | POA: Diagnosis present

## 2022-03-10 DIAGNOSIS — Z95810 Presence of automatic (implantable) cardiac defibrillator: Secondary | ICD-10-CM

## 2022-03-10 DIAGNOSIS — I10 Essential (primary) hypertension: Secondary | ICD-10-CM | POA: Diagnosis present

## 2022-03-10 DIAGNOSIS — I252 Old myocardial infarction: Secondary | ICD-10-CM

## 2022-03-10 DIAGNOSIS — N1832 Chronic kidney disease, stage 3b: Secondary | ICD-10-CM | POA: Diagnosis present

## 2022-03-10 DIAGNOSIS — Z8249 Family history of ischemic heart disease and other diseases of the circulatory system: Secondary | ICD-10-CM

## 2022-03-10 LAB — COMPREHENSIVE METABOLIC PANEL
ALT: 17 U/L (ref 0–44)
AST: 27 U/L (ref 15–41)
Albumin: 3 g/dL — ABNORMAL LOW (ref 3.5–5.0)
Alkaline Phosphatase: 66 U/L (ref 38–126)
Anion gap: 9 (ref 5–15)
BUN: 24 mg/dL — ABNORMAL HIGH (ref 6–20)
CO2: 21 mmol/L — ABNORMAL LOW (ref 22–32)
Calcium: 8.9 mg/dL (ref 8.9–10.3)
Chloride: 108 mmol/L (ref 98–111)
Creatinine, Ser: 2.34 mg/dL — ABNORMAL HIGH (ref 0.61–1.24)
GFR, Estimated: 32 mL/min — ABNORMAL LOW (ref >60)
Glucose, Bld: 135 mg/dL — ABNORMAL HIGH (ref 70–99)
Potassium: 4.2 mmol/L (ref 3.5–5.1)
Sodium: 138 mmol/L (ref 135–145)
Total Bilirubin: 0.7 mg/dL (ref 0.3–1.2)
Total Protein: 6.7 g/dL (ref 6.5–8.1)

## 2022-03-10 LAB — PREALBUMIN: Prealbumin: 34.8 mg/dL (ref 18–38)

## 2022-03-10 LAB — BASIC METABOLIC PANEL
BUN/Creatinine Ratio: 12 (ref 9–20)
BUN: 28 mg/dL — ABNORMAL HIGH (ref 6–24)
CO2: 21 mmol/L (ref 20–29)
Calcium: 9.2 mg/dL (ref 8.7–10.2)
Chloride: 108 mmol/L — ABNORMAL HIGH (ref 96–106)
Creatinine, Ser: 2.32 mg/dL — ABNORMAL HIGH (ref 0.76–1.27)
Glucose: 125 mg/dL — ABNORMAL HIGH (ref 70–99)
Potassium: 4.4 mmol/L (ref 3.5–5.2)
Sodium: 144 mmol/L (ref 134–144)
eGFR: 32 mL/min/{1.73_m2} — ABNORMAL LOW (ref >59)

## 2022-03-10 LAB — MAGNESIUM: Magnesium: 1.8 mg/dL (ref 1.7–2.4)

## 2022-03-10 LAB — CBC WITH DIFFERENTIAL/PLATELET
Abs Immature Granulocytes: 0.03 10*3/uL (ref 0.00–0.07)
Basophils Absolute: 0 10*3/uL (ref 0.0–0.1)
Basophils Relative: 0 %
Eosinophils Absolute: 0 10*3/uL (ref 0.0–0.5)
Eosinophils Relative: 1 %
HCT: 39.4 % (ref 39.0–52.0)
Hemoglobin: 11.6 g/dL — ABNORMAL LOW (ref 13.0–17.0)
Immature Granulocytes: 0 %
Lymphocytes Relative: 13 %
Lymphs Abs: 1 10*3/uL (ref 0.7–4.0)
MCH: 25.1 pg — ABNORMAL LOW (ref 26.0–34.0)
MCHC: 29.4 g/dL — ABNORMAL LOW (ref 30.0–36.0)
MCV: 85.3 fL (ref 80.0–100.0)
Monocytes Absolute: 0.4 10*3/uL (ref 0.1–1.0)
Monocytes Relative: 6 %
Neutro Abs: 6.2 10*3/uL (ref 1.7–7.7)
Neutrophils Relative %: 80 %
Platelets: 281 10*3/uL (ref 150–400)
RBC: 4.62 MIL/uL (ref 4.22–5.81)
RDW: 15.2 % (ref 11.5–15.5)
WBC: 7.7 10*3/uL (ref 4.0–10.5)
nRBC: 0 % (ref 0.0–0.2)

## 2022-03-10 LAB — CK: Total CK: 489 U/L — ABNORMAL HIGH (ref 49–397)

## 2022-03-10 LAB — RESP PANEL BY RT-PCR (FLU A&B, COVID) ARPGX2
Influenza A by PCR: NEGATIVE
Influenza B by PCR: NEGATIVE
SARS Coronavirus 2 by RT PCR: NEGATIVE

## 2022-03-10 LAB — TROPONIN I (HIGH SENSITIVITY): Troponin I (High Sensitivity): 28 ng/L — ABNORMAL HIGH (ref <18)

## 2022-03-10 LAB — GLUCOSE, CAPILLARY
Glucose-Capillary: 57 mg/dL — ABNORMAL LOW (ref 70–99)
Glucose-Capillary: 40 mg/dL — CL (ref 70–99)
Glucose-Capillary: 114 mg/dL — ABNORMAL HIGH (ref 70–99)

## 2022-03-10 LAB — CBG MONITORING, ED
Glucose-Capillary: 142 mg/dL — ABNORMAL HIGH (ref 70–99)
Glucose-Capillary: 159 mg/dL — ABNORMAL HIGH (ref 70–99)
Glucose-Capillary: 122 mg/dL — ABNORMAL HIGH (ref 70–99)

## 2022-03-10 LAB — TSH: TSH: 0.817 u[IU]/mL (ref 0.350–4.500)

## 2022-03-10 LAB — PHOSPHORUS: Phosphorus: 4.2 mg/dL (ref 2.5–4.6)

## 2022-03-10 MED ORDER — SODIUM CHLORIDE 0.9 % IV SOLN: 250.0000 mL | INTRAVENOUS | Status: DC | PRN

## 2022-03-10 MED ORDER — FUROSEMIDE 40 MG PO TABS: 80.0000 mg | ORAL_TABLET | Freq: Two times a day (BID) | ORAL | Status: DC

## 2022-03-10 MED ORDER — ACETAMINOPHEN 650 MG RE SUPP: 650.0000 mg | Freq: Four times a day (QID) | RECTAL | Status: DC | PRN

## 2022-03-10 MED ORDER — GABAPENTIN 300 MG PO CAPS
600.0000 mg | ORAL_CAPSULE | Freq: Every day | ORAL | Status: DC
Start: 2022-03-10 — End: 2022-03-15
  Administered 2022-03-10 – 2022-03-14 (×5): 600 mg via ORAL
  Filled 2022-03-10 (×5): qty 2

## 2022-03-10 MED ORDER — TAMSULOSIN HCL 0.4 MG PO CAPS
0.4000 mg | ORAL_CAPSULE | Freq: Every day | ORAL | Status: DC
  Administered 2022-03-11 – 2022-03-16 (×6): 0.4 mg via ORAL
  Filled 2022-03-10 (×6): qty 1

## 2022-03-10 MED ORDER — ISOSORBIDE MONONITRATE ER 30 MG PO TB24
30.0000 mg | ORAL_TABLET | Freq: Every day | ORAL | Status: DC
Start: 2022-03-11 — End: 2022-03-15
  Administered 2022-03-11 – 2022-03-14 (×4): 30 mg via ORAL
  Filled 2022-03-10 (×4): qty 1

## 2022-03-10 MED ORDER — ROSUVASTATIN CALCIUM 20 MG PO TABS
40.0000 mg | ORAL_TABLET | Freq: Every day | ORAL | Status: DC
  Administered 2022-03-10 – 2022-03-15 (×6): 40 mg via ORAL
  Filled 2022-03-10 (×6): qty 2

## 2022-03-10 MED ORDER — HYDRALAZINE HCL 25 MG PO TABS
25.0000 mg | ORAL_TABLET | Freq: Three times a day (TID) | ORAL | Status: DC
Start: 2022-03-10 — End: 2022-03-13
  Administered 2022-03-10 – 2022-03-12 (×7): 25 mg via ORAL
  Filled 2022-03-10 (×7): qty 1

## 2022-03-10 MED ORDER — ACETAMINOPHEN 325 MG PO TABS: 650.0000 mg | ORAL_TABLET | Freq: Four times a day (QID) | ORAL | Status: DC | PRN

## 2022-03-10 MED ORDER — ALBUTEROL SULFATE (2.5 MG/3ML) 0.083% IN NEBU: 2.5000 mg | INHALATION_SOLUTION | RESPIRATORY_TRACT | Status: DC | PRN

## 2022-03-10 MED ORDER — CARVEDILOL 12.5 MG PO TABS
12.5000 mg | ORAL_TABLET | Freq: Two times a day (BID) | ORAL | Status: DC
  Administered 2022-03-11 – 2022-03-17 (×13): 12.5 mg via ORAL
  Filled 2022-03-10 (×13): qty 1

## 2022-03-10 MED ORDER — ALBUTEROL SULFATE HFA 108 (90 BASE) MCG/ACT IN AERS: 1.0000 | INHALATION_SPRAY | RESPIRATORY_TRACT | Status: DC | PRN

## 2022-03-10 MED ORDER — SODIUM CHLORIDE 0.9% FLUSH
3.0000 mL | Freq: Two times a day (BID) | INTRAVENOUS | Status: DC
  Administered 2022-03-10 – 2022-03-17 (×14): 3 mL via INTRAVENOUS

## 2022-03-10 MED ORDER — SODIUM CHLORIDE 0.9% FLUSH: 3.0000 mL | INTRAVENOUS | Status: DC | PRN

## 2022-03-10 MED ORDER — HYDROCODONE-ACETAMINOPHEN 5-325 MG PO TABS: 1.0000 | ORAL_TABLET | ORAL | Status: DC | PRN

## 2022-03-10 MED ORDER — DEXTROSE 50 % IV SOLN
25.0000 mL | Freq: Two times a day (BID) | INTRAVENOUS | Status: DC | PRN
  Administered 2022-03-10: 25 mL via INTRAVENOUS
  Filled 2022-03-10: qty 50

## 2022-03-10 NOTE — Subjective & Objective (Signed)
Recurrent hypoglycemic episodes at home ?Gets confused with hypoglycemia  ?On lantus 35 units at night ?Also has been having increased edema and had his lasix increase recently reports increased somnolence ?Recently seen in ER for hypoglycemia also noted hypokalemia ?Was seen by cardiology yesterday and had potassium increased too ? ?

## 2022-03-10 NOTE — Assessment & Plan Note (Signed)
-  chronic avoid nephrotoxic medications such as NSAIDs, Vanco Zosyn combo,  avoid hypotension, continue to follow renal function  

## 2022-03-10 NOTE — Assessment & Plan Note (Signed)
Will eed PT OT eval prior to DC ?

## 2022-03-10 NOTE — Assessment & Plan Note (Signed)
obtain dopplers to Ro DVT ?

## 2022-03-10 NOTE — Assessment & Plan Note (Signed)
continue aspirin, statin and coreg, currently stable ? ?

## 2022-03-10 NOTE — Assessment & Plan Note (Addendum)
On neurontin , will conitnue ?

## 2022-03-10 NOTE — Assessment & Plan Note (Signed)
Continue crestor 

## 2022-03-10 NOTE — Assessment & Plan Note (Signed)
q 2h blood glucose checks ?Hold lantus for tonight and follow BG ?May need to change to NPH as an out pt given CKD ?

## 2022-03-10 NOTE — ED Provider Notes (Signed)
?[place] ?Provider Note ? ? ?CSN: [id number] ?Arrival date & time: 03/10/22  1424 ? ?  ? ?History ? ?Chief Complaint  ?Patient presents with  ? Hypoglycemia  ? ? ?Jeffrey [name] is a 56 y.o. male w/ DMII presenting for hypoglycemia at home.  EMS found him and his glucose was 41.  Girlfriend noticed that he seemed to not be acting like himself and was incontinent of urine.  Patient has been doing this when his blood sugars get low.  This is the third time this happened since Sunday.  It happened yesterday but she was able to give him something and get his blood glucose up.  Today she gave him something but she could not get up his blood glucose up.  He took 35 of Lantus last night.  Patient has no symptoms.  Denies any nausea, vomiting, headache, fevers, chills, abdominal pain, weakness, or mental status changes.  Sister reports she has noticed a cough. ? ?Call friend gave 15 g of oral glucose sugars being found in the 40s.  EMS gave him IM glucagon and 5 g of oral glucose.  His blood sugars improved to 106. ? ? ?Hypoglycemia ? ?  ? ?Home Medications ?Prior to Admission medications   ?Medication Sig Start Date End Date Taking? Authorizing Provider  ?albuterol (VENTOLIN HFA) 108 (90 Base) MCG/ACT inhaler Inhale 1 to 2 puffs by mouth into the lungs every 6 hours as needed for wheezing or shortness of breath. 10/07/21  Yes Argentina Donovan, PA-C  ?aspirin 81 MG EC tablet Take 1 tablet (81 mg total) by mouth daily. Swallow whole. 07/03/21  Yes Mariel Aloe, MD  ?carvedilol (COREG) 12.5 MG tablet TAKE 1 TABLET (12.5 MG TOTAL) BY MOUTH 2 (TWO) TIMES DAILY WITH A MEAL. 10/20/21  Yes Charlott Rakes, MD  ?cetirizine (ZYRTEC) 10 MG tablet TAKE 1 TABLET (10 MG TOTAL) BY MOUTH DAILY. ?Patient taking differently: Take 10 mg by mouth daily as needed for allergies. 07/27/20  Yes Charlott Rakes, MD  ?ferrous sulfate 325 (65 FE) MG tablet Take 1 tablet (325 mg total) by mouth 2 (two)  times daily with a meal. 04/03/16  Yes Dhungel, Nishant, MD  ?furosemide (LASIX) 40 MG tablet Take 1 tablet (40 mg total) by mouth 2 (two) times daily. ?Patient taking differently: Take 80 mg by mouth 2 (two) times daily. Take 80 mg BID X 4 Days. Then 40 mg BID 10/20/21 10/20/22 Yes Charlott Rakes, MD  ?gabapentin (NEURONTIN) 300 MG capsule Take 2 capsules (600 mg total) by mouth at bedtime. 10/20/21 10/20/22 Yes Charlott Rakes, MD  ?hydrALAZINE (APRESOLINE) 25 MG tablet Take 1 tablet (25 mg total) by mouth 3 (three) times daily. 10/20/21 10/20/22 Yes Charlott Rakes, MD  ?insulin aspart (NOVOLOG FLEXPEN) 100 UNIT/ML FlexPen Inject 0-9 Units into the skin See admin instructions. Inject 0-9 units into the skin 3 times a day with meals, PER SLIDING SCALE:  ?CBG 70 - 120 =  0 unit ?CBG 121 - 150: 1 unit  ?CBG 151 - 200: 2 units  ?CBG 201 - 250: 3 units  ?CBG 251 - 300: 5 units  ?CBG 301 - 350: 7 units  ?CBG 351 - 400 9 units 02/24/22  Yes Charlott Rakes, MD  ?isosorbide mononitrate (IMDUR) 30 MG 24 hr tablet Take 1 tablet (30 mg total) by mouth daily. 10/20/21  Yes Charlott Rakes, MD  ?LANTUS SOLOSTAR 100 UNIT/ML Solostar Pen Inject 35 Units into the skin daily. ?Patient  taking differently: Inject 35 Units into the skin at bedtime. 10/20/21  Yes Charlott Rakes, MD  ?magnesium oxide (MAG-OX) 400 MG tablet Take 1 tablet (400 mg total) by mouth daily. 08/07/21  Yes Shelly Coss, MD  ?ondansetron (ZOFRAN) 4 MG tablet Take 1 tablet (4 mg total) by mouth every 6 (six) hours as needed for nausea or vomiting. 51/02/58  Yes Delora Fuel, MD  ?potassium chloride SA (KLOR-CON M) 20 MEQ tablet Take 2 tablets (40 mEq total) by mouth 2 (two) times daily. 03/09/22  Yes Deboraha Sprang, MD  ?rosuvastatin (CRESTOR) 40 MG tablet TAKE 1 TABLET (40 MG TOTAL) BY MOUTH DAILY. 10/20/21 10/20/22 Yes Charlott Rakes, MD  ?Vitamin D, Ergocalciferol, (DRISDOL) 1.25 MG (50000 UNIT) CAPS capsule Take 1 capsule by mouth weekly. 03/07/22  Yes Claudia Desanctis, MD  ?Accu-Chek Softclix Lancets lancets Use to check blood sugar three times daily E11.65 08/12/21   Charlott Rakes, MD  ?Blood Glucose Monitoring Suppl (ACCU-CHEK GUIDE) w/Device KIT Use to check blood sugar three times daily E11.65 08/12/21   Charlott Rakes, MD  ?Elastic Bandages & Supports (T.E.D. KNEE LENGTH/S-LONG) MISC 1 (one) each daily, apply compression stockings daily to help decrease leg swelling 11/11/21     ?glucose blood (ACCU-CHEK GUIDE) test strip Use to check blood sugar three times daily E11.65 08/12/21   Charlott Rakes, MD  ?Insulin Pen Needle (BD PEN NEEDLE NANO U/F) 32G X 4 MM MISC USE TO INJECT LANTUS DAILY. MUST USE NEW PEN NEEDLE WITH EACH INJECTION. 10/07/21   Argentina Donovan, PA-C  ?oxyCODONE-acetaminophen (PERCOCET/ROXICET) 5-325 MG tablet Take 1 tablet by mouth every 6 (six) hours as needed. ?Patient not taking: Reported on 03/09/2022 11/26/21   Ulyses Amor, PA-C  ?tamsulosin (FLOMAX) 0.4 MG CAPS capsule Take 1 capsule by mouth nightly, Stop if it causes dizziness ?Patient not taking: Reported on 03/10/2022 11/11/21     ?   ? ?Allergies    ?Patient has no known allergies.   ? ?Review of Systems   ?Review of Systems  ?Constitutional:  Negative for chills and fever.  ?Respiratory:  Positive for cough.   ?Cardiovascular:  Negative for chest pain and palpitations.  ?Musculoskeletal:  Negative for neck pain and neck stiffness.  ?Skin:  Negative for rash and wound.  ? ?Physical Exam ?Updated Vital Signs ?BP (!) 173/111 (BP Location: Right Arm)   Pulse 76   Temp 98.8 ?F (37.1 ?C) (Oral)   Resp 18   Ht '5\' 7"'  (1.702 m)   Wt 97.3 kg   SpO2 92%   BMI 33.60 kg/m?  ?Physical Exam ?Constitutional:   ?   Appearance: He is obese.  ?   Comments: Somnolent  ?HENT:  ?   Head: Normocephalic and atraumatic.  ?Cardiovascular:  ?   Rate and Rhythm: Normal rate and regular rhythm.  ?Pulmonary:  ?   Effort: Pulmonary effort is normal. No respiratory distress.  ?Abdominal:  ?   Tenderness: There is no  abdominal tenderness. There is no guarding or rebound.  ?Musculoskeletal:  ?   Right lower leg: Edema present.  ?   Left lower leg: Edema present.  ?Neurological:  ?   Mental Status: He is oriented to person, place, and time.  ?Psychiatric:     ?   Mood and Affect: Mood normal.     ?   Behavior: Behavior normal.  ? ? ?ED Results / Procedures / Treatments   ?Labs ?(all labs ordered are listed, but only abnormal  results are displayed) ?Labs Reviewed  ?CBC WITH DIFFERENTIAL/PLATELET - Abnormal; Notable for the following components:  ?    Result Value  ? Hemoglobin 11.6 (*)   ? MCH 25.1 (*)   ? MCHC 29.4 (*)   ? All other components within normal limits  ?COMPREHENSIVE METABOLIC PANEL - Abnormal; Notable for the following components:  ? CO2 21 (*)   ? Glucose, Bld 135 (*)   ? BUN 24 (*)   ? Creatinine, Ser 2.34 (*)   ? Albumin 3.0 (*)   ? GFR, Estimated 32 (*)   ? All other components within normal limits  ?CK - Abnormal; Notable for the following components:  ? Total CK 489 (*)   ? All other components within normal limits  ?GLUCOSE, CAPILLARY - Abnormal; Notable for the following components:  ? Glucose-Capillary 57 (*)   ? All other components within normal limits  ?GLUCOSE, CAPILLARY - Abnormal; Notable for the following components:  ? Glucose-Capillary 40 (*)   ? All other components within normal limits  ?GLUCOSE, CAPILLARY - Abnormal; Notable for the following components:  ? Glucose-Capillary 114 (*)   ? All other components within normal limits  ?CBG MONITORING, ED - Abnormal; Notable for the following components:  ? Glucose-Capillary 142 (*)   ? All other components within normal limits  ?CBG MONITORING, ED - Abnormal; Notable for the following components:  ? Glucose-Capillary 159 (*)   ? All other components within normal limits  ?CBG MONITORING, ED - Abnormal; Notable for the following components:  ? Glucose-Capillary 122 (*)   ? All other components within normal limits  ?TROPONIN I (HIGH SENSITIVITY) -  Abnormal; Notable for the following components:  ? Troponin I (High Sensitivity) 28 (*)   ? All other components within normal limits  ?RESP PANEL BY RT-PCR (FLU A&B, COVID) ARPGX2  ?MAGNESIUM  ?PHOSPHORUS  ?PREALBUMIN

## 2022-03-10 NOTE — ED Triage Notes (Signed)
Pt BIB [facility] from home. Pt hypoglycemic at 40 found unresponsive by girlfriend. Girlfriend gave 15 g of oral glucose. EMS administered IM glucagon and 5 g oral glucose with improvement to 106. Pt does take insulin for type 1 DM. Pt Aox4.  ?

## 2022-03-10 NOTE — Assessment & Plan Note (Signed)
Continue home medications Coreg Lasix hydralazine and Imdur ?

## 2022-03-10 NOTE — Assessment & Plan Note (Signed)
Continue lasix at the current dose and adjust as needed ?

## 2022-03-10 NOTE — Assessment & Plan Note (Signed)
Given reccurent hypoglycemia observe on frequent CBG and hold lantus ?

## 2022-03-10 NOTE — H&P (Signed)
? ? ?Jeffrey [name]:[id number] DOB: Jan 27, 1966 DOA: 03/10/2022 ?  ?PCP: Charlott Rakes, MD   ?Outpatient Specialists:  ?CARDS:  Dr.Nahser, [facility] Areas ?NEphrology:   Dr.Foster ?  ? ?Patient arrived to ER on 03/10/22 at 1424 ?Referred by Attending Toy Baker, MD ? ? ?Patient coming from:   ? home Lives w SO ?  ? ?Chief Complaint:   ?Chief Complaint  ?Patient presents with  ? Hypoglycemia  ? ? ?HPI: ?Jeffrey [name] is a 56 y.o. male with medical history significant of  non ischemic cardiomyopathy, sp AiCD, non-obstructive CAD, CKD, DM2, HTN, HLD, CVA ? ?  ? ?Presented with   episode of recurrent confusion currently found to be hypoglycemic down to 40 unresponsive gave 15 g of oral glucose EMS came in and administered glucagon and some oral glucose.  Improvement up to 106 ?Recurrent hypoglycemic episodes at home ?Gets confused with hypoglycemia  ?On lantus 35 units at night ?Also has been having increased edema and had his lasix increase recently reports increased somnolence ?Recently seen in ER for hypoglycemia also noted hypokalemia ?Was seen by cardiology yesterday and had potassium increased too ?GF gave insulin last night at and he has not been eating much ?This is  3rd even since Sunday and 2nd trip ?Family thinks he may forget that she already gave him insulin and gives himself extra dose ?He has had episode of going unconscious and urinating on self when BG goes too low ?He has not had his BP meds ?He has been forgetful ?He does not have an Endocrinologist ? He has been very much confused since his last few strokes ?He has to use his  inhaler recently and has been  retaining fluid ?He has not been urinating as much lately ?He has been falling , hit his head a number of times, family states he si at baseline mildly weaker on the right side ? ? Initial COVID TEST  ?NEGATIVE  ? ?Lab Results  ?Component Value Date  ? [place] NEGATIVE 03/10/2022  ? [place] NEGATIVE 08/06/2021  ?  [place] NEGATIVE 06/30/2021  ? [place] Not Detected 01/07/2021  ? ?  ?Regarding pertinent Chronic problems:   ? ? Hyperlipidemia -  on statins Crestor ?Lipid   Panel  ?   ?Component Value Date/Time  ? CHOL 201 (H) 07/02/2021 0037  ? CHOL 137 04/25/2019 0925  ? TRIG 225 (H) 07/02/2021 0037  ? HDL 36 (L) 07/02/2021 0037  ? HDL 36 (L) 04/25/2019 0925  ? CHOLHDL 5.6 07/02/2021 0037  ? VLDL 45 (H) 07/02/2021 0037  ? [place] 120 (H) 07/02/2021 0037  ? [place] 57 04/25/2019 0925  ? LABVLDL 44 (H) 04/25/2019 0925  ? ? ? HTN on Coreg hydralazine Lasix and Imdur ? ? chronic CHF diastolic/systolic/ combined - last echo July 2022 EF 30-35% ?Grade I diastolic  ? ?  CAD  - On Aspirin, statin, betablocker,  ?               -  followed by cardiology ?               - last cardiac cath 2018 ?   ?  DM 2 -  ?Lab Results  ?Component Value Date  ? HGBA1C 7.6 (A) 10/20/2021  ? on insulin LAntus 35 units ?  ? Morbid obesity-   ?BMI Readings from Last 1 Encounters:  ?03/10/22 34.53 kg/m?  ?   ?    ? CKD stage IIIb- baseline  Cr 2.1 ?Estimated Creatinine Clearance: 39.3 mL/min (A) (by C-G formula based on SCr of 2.34 mg/dL (H)). ? ?Lab Results  ?Component Value Date  ? CREATININE 2.34 (H) 03/10/2022  ? CREATININE 2.32 (H) 03/09/2022  ? CREATININE 2.11 (H) 03/06/2022  ? ? Chronic anemia - baseline hg Hemoglobin & Hematocrit  ?Recent Labs  ?  11/24/21 ?1159 03/06/22 ?0106 03/10/22 ?1500  ?HGB 11.4* 10.7* 11.6*  ?  ?While in ER: ?  ? ?Ordered ? ?CT HEAD   NON acute ? ?CXR -    ?1. Cardiomegaly with mild central vascular prominence no overt  ?pulmonary edema.  ?2. Slightly reduced lung volumes with bibasilar opacities favor  ?atelectasis, although infiltrate not excluded.  ?  ? ?Following Medications were ordered in ER: ?Medications - No data to display  ?_______________________________________________________ ?  ?  ?ED Triage Vitals  ?Enc Vitals Group  ?   BP 03/10/22 1431 (!) 196/106  ?   Pulse Rate 03/10/22 1431 64  ?   Resp 03/10/22  1431 19  ?   Temp 03/10/22 1431 98.3 ?F (36.8 ?C)  ?   Temp Source 03/10/22 1431 Oral  ?   SpO2 03/10/22 1431 100 %  ?   Weight 03/10/22 1438 220 lb 7.4 oz (100 kg)  ?   Height 03/10/22 1438 5\' 7"  (1.702 m)  ?   Head Circumference --   ?   Peak Flow --   ?   Pain Score --   ?   Pain Loc --   ?   Pain Edu? --   ?   Excl. in [place]? --   ?RKYH(06)@    ? _________________________________________ ?Significant initial  Findings: ?Abnormal Labs Reviewed  ?CBC WITH DIFFERENTIAL/PLATELET - Abnormal; Notable for the following components:  ?    Result Value  ? Hemoglobin 11.6 (*)   ? MCH 25.1 (*)   ? MCHC 29.4 (*)   ? All other components within normal limits  ?COMPREHENSIVE METABOLIC PANEL - Abnormal; Notable for the following components:  ? CO2 21 (*)   ? Glucose, Bld 135 (*)   ? BUN 24 (*)   ? Creatinine, Ser 2.34 (*)   ? Albumin 3.0 (*)   ? GFR, Estimated 32 (*)   ? All other components within normal limits  ?CBG MONITORING, ED - Abnormal; Notable for the following components:  ? Glucose-Capillary 142 (*)   ? All other components within normal limits  ?CBG MONITORING, ED - Abnormal; Notable for the following components:  ? Glucose-Capillary 159 (*)   ? All other components within normal limits  ?   ?ECG: Ordered ?   ?The recent clinical data is shown below. ?Vitals:  ? 03/10/22 1630 03/10/22 1700 03/10/22 1715 03/10/22 1730  ?BP: (!) 135/103 (!) 161/95 (!) 173/111 (!) 179/110  ?Pulse:    71  ?Resp: 20 16 (!) 21 14  ?Temp:      ?TempSrc:      ?SpO2:    100%  ?Weight:      ?Height:      ?  ?WBC ? ?   ?Component Value Date/Time  ? WBC 7.7 03/10/2022 1500  ? LYMPHSABS 1.0 03/10/2022 1500  ? MONOABS 0.4 03/10/2022 1500  ? EOSABS 0.0 03/10/2022 1500  ? BASOSABS 0.0 03/10/2022 1500  ?   ? UA  ordered ?  ?Urine analysis: ?   ?Component Value Date/Time  ? [place] YELLOW 11/24/2021 1159  ? APPEARANCEUR CLEAR 11/24/2021 1159  ? LABSPEC  1.020 11/24/2021 1159  ? PHURINE 6.5 11/24/2021 1159  ? GLUCOSEU NEGATIVE 11/24/2021 1159  ? HGBUR  MODERATE (A) 11/24/2021 1159  ? [place] NEGATIVE 11/24/2021 1159  ? BILIRUBINUR neg 03/30/2016 1116  ? [place] NEGATIVE 11/24/2021 1159  ? PROTEINUR >300 (A) 11/24/2021 1159  ? UROBILINOGEN 0.2 03/30/2016 1116  ? UROBILINOGEN 1.0 03/10/2014 2230  ? NITRITE NEGATIVE 11/24/2021 1159  ? LEUKOCYTESUR NEGATIVE 11/24/2021 1159  ? ? ?Results for orders placed or performed during the hospital encounter of 03/10/22  ?Resp Panel by RT-PCR (Flu A&B, Covid) Nasopharyngeal Swab     Status: None  ? Collection Time: 03/10/22  4:25 PM  ? Specimen: Nasopharyngeal Swab; Nasopharyngeal(NP) swabs in vial transport medium  ?Result Value Ref Range Status  ? SARS Coronavirus 2 by RT PCR NEGATIVE NEGATIVE Final  ?      ? Influenza A by PCR NEGATIVE NEGATIVE Final  ? Influenza B by PCR NEGATIVE NEGATIVE Final  ?      ?  ?_______________________________________________ ?Hospitalist was called for admission for  hypoglycemia ? ? ?The following Work up has been ordered so far: ? ?Orders Placed This Encounter  ?Procedures  ? Resp Panel by RT-PCR (Flu A&B, Covid) Nasopharyngeal Swab  ? DG Chest Portable 1 View  ? CBC with Differential  ? Comprehensive metabolic panel  ? C-peptide  ? CK  ? Magnesium  ? Phosphorus  ? TSH  ? Osmolality, urine  ? Sodium, urine, random  ? Creatinine, urine, random  ? Prealbumin  ? Blood gas, venous  ? Cardiac monitoring  ? Consult for [place] Admission  ? POC CBG, ED  ? CBG monitoring, ED  ? Insert peripheral IV  ? Place in observation (patient's expected length of stay will be less than 2 midnights)  ?  ? ?OTHER Significant initial  Findings: ? ?labs showing: ? ?  ?Recent Labs  ?Lab 03/06/22 ?0106 03/09/22 ?1639 03/10/22 ?1500  ?NA 136 144 138  ?K 3.0* 4.4 4.2  ?CO2 25 21 21*  ?GLUCOSE 276* 125* 135*  ?BUN 20 28* 24*  ?CREATININE 2.11* 2.32* 2.34*  ?CALCIUM 8.5* 9.2 8.9  ? ? ?Cr   Up from baseline see below ?Lab Results  ?Component Value Date  ? CREATININE 2.34 (H) 03/10/2022  ? CREATININE 2.32 (H)  03/09/2022  ? CREATININE 2.11 (H) 03/06/2022  ? ? ?Recent Labs  ?Lab 03/10/22 ?1500  ?AST 27  ?ALT 17  ?ALKPHOS 66  ?BILITOT 0.7  ?PROT 6.7  ?ALBUMIN 3.0*  ? ?Lab Results  ?Component Value Date  ? [place]

## 2022-03-10 NOTE — Assessment & Plan Note (Signed)
recurrent episodesof decreased responsiveness ad confusion associated with hypoglycemia ?monitor for hypoglycemic events and any seizure activity ?family does not describe seizure like but there is component of incontinence that is concerning ?Would avoid hypoglycemia ? ?

## 2022-03-11 ENCOUNTER — Observation Stay (HOSPITAL_BASED_OUTPATIENT_CLINIC_OR_DEPARTMENT_OTHER): Payer: Medicare Other

## 2022-03-11 DIAGNOSIS — E162 Hypoglycemia, unspecified: Secondary | ICD-10-CM | POA: Diagnosis not present

## 2022-03-11 DIAGNOSIS — R609 Edema, unspecified: Secondary | ICD-10-CM | POA: Diagnosis not present

## 2022-03-11 LAB — PHOSPHORUS: Phosphorus: 4.3 mg/dL (ref 2.5–4.6)

## 2022-03-11 LAB — CBC WITH DIFFERENTIAL/PLATELET
Abs Immature Granulocytes: 0.01 10*3/uL (ref 0.00–0.07)
Basophils Absolute: 0 10*3/uL (ref 0.0–0.1)
Basophils Relative: 0 %
Eosinophils Absolute: 0.1 10*3/uL (ref 0.0–0.5)
Eosinophils Relative: 1 %
HCT: 35 % — ABNORMAL LOW (ref 39.0–52.0)
Hemoglobin: 10.8 g/dL — ABNORMAL LOW (ref 13.0–17.0)
Immature Granulocytes: 0 %
Lymphocytes Relative: 21 %
Lymphs Abs: 1.5 10*3/uL (ref 0.7–4.0)
MCH: 25.2 pg — ABNORMAL LOW (ref 26.0–34.0)
MCHC: 30.9 g/dL (ref 30.0–36.0)
MCV: 81.6 fL (ref 80.0–100.0)
Monocytes Absolute: 0.5 10*3/uL (ref 0.1–1.0)
Monocytes Relative: 6 %
Neutro Abs: 5.2 10*3/uL (ref 1.7–7.7)
Neutrophils Relative %: 72 %
Platelets: 282 10*3/uL (ref 150–400)
RBC: 4.29 MIL/uL (ref 4.22–5.81)
RDW: 15.3 % (ref 11.5–15.5)
WBC: 7.2 10*3/uL (ref 4.0–10.5)
nRBC: 0 % (ref 0.0–0.2)

## 2022-03-11 LAB — MAGNESIUM: Magnesium: 1.6 mg/dL — ABNORMAL LOW (ref 1.7–2.4)

## 2022-03-11 LAB — COMPREHENSIVE METABOLIC PANEL
ALT: 16 U/L (ref 0–44)
AST: 21 U/L (ref 15–41)
Albumin: 2.9 g/dL — ABNORMAL LOW (ref 3.5–5.0)
Alkaline Phosphatase: 61 U/L (ref 38–126)
Anion gap: 10 (ref 5–15)
BUN: 27 mg/dL — ABNORMAL HIGH (ref 6–20)
CO2: 21 mmol/L — ABNORMAL LOW (ref 22–32)
Calcium: 9 mg/dL (ref 8.9–10.3)
Chloride: 106 mmol/L (ref 98–111)
Creatinine, Ser: 2.42 mg/dL — ABNORMAL HIGH (ref 0.61–1.24)
GFR, Estimated: 30 mL/min — ABNORMAL LOW (ref >60)
Glucose, Bld: 89 mg/dL (ref 70–99)
Potassium: 3.9 mmol/L (ref 3.5–5.1)
Sodium: 137 mmol/L (ref 135–145)
Total Bilirubin: 0.5 mg/dL (ref 0.3–1.2)
Total Protein: 6.7 g/dL (ref 6.5–8.1)

## 2022-03-11 LAB — GLUCOSE, CAPILLARY
Glucose-Capillary: 93 mg/dL (ref 70–99)
Glucose-Capillary: 97 mg/dL (ref 70–99)
Glucose-Capillary: 52 mg/dL — ABNORMAL LOW (ref 70–99)
Glucose-Capillary: 68 mg/dL — ABNORMAL LOW (ref 70–99)
Glucose-Capillary: 104 mg/dL — ABNORMAL HIGH (ref 70–99)
Glucose-Capillary: 99 mg/dL (ref 70–99)
Glucose-Capillary: 83 mg/dL (ref 70–99)
Glucose-Capillary: 98 mg/dL (ref 70–99)
Glucose-Capillary: 220 mg/dL — ABNORMAL HIGH (ref 70–99)
Glucose-Capillary: 136 mg/dL — ABNORMAL HIGH (ref 70–99)
Glucose-Capillary: 213 mg/dL — ABNORMAL HIGH (ref 70–99)

## 2022-03-11 LAB — URINALYSIS, ROUTINE W REFLEX MICROSCOPIC
Bacteria, UA: NONE SEEN
Bilirubin Urine: NEGATIVE
Glucose, UA: NEGATIVE mg/dL
Ketones, ur: NEGATIVE mg/dL
Leukocytes,Ua: NEGATIVE
Nitrite: NEGATIVE
Protein, ur: >=300 mg/dL — AB
Specific Gravity, Urine: 1.015 (ref 1.005–1.030)
pH: 6 (ref 5.0–8.0)

## 2022-03-11 LAB — BLOOD GAS, VENOUS
Acid-Base Excess: 4.7 mmol/L — ABNORMAL HIGH (ref 0.0–2.0)
Bicarbonate: 30.9 mmol/L — ABNORMAL HIGH (ref 20.0–28.0)
O2 Saturation: 48.2 %
Patient temperature: 37
pCO2, Ven: 51 mmHg (ref 44–60)
pH, Ven: 7.39 (ref 7.25–7.43)
pO2, Ven: 32 mmHg (ref 32–45)

## 2022-03-11 LAB — OSMOLALITY, URINE: Osmolality, Ur: 488 mOsm/kg (ref 300–900)

## 2022-03-11 LAB — TSH: TSH: 0.831 u[IU]/mL (ref 0.350–4.500)

## 2022-03-11 LAB — CREATININE, URINE, RANDOM: Creatinine, Urine: 143.21 mg/dL

## 2022-03-11 LAB — HEMOGLOBIN A1C
Hgb A1c MFr Bld: 9.7 % — ABNORMAL HIGH (ref 4.8–5.6)
Mean Plasma Glucose: 231.69 mg/dL

## 2022-03-11 LAB — SODIUM, URINE, RANDOM: Sodium, Ur: 84 mmol/L

## 2022-03-11 MED ORDER — FUROSEMIDE 40 MG PO TABS
80.0000 mg | ORAL_TABLET | Freq: Two times a day (BID) | ORAL | Status: DC
  Administered 2022-03-11: 80 mg via ORAL
  Filled 2022-03-11: qty 2

## 2022-03-11 MED ORDER — MAGNESIUM SULFATE 4 GM/100ML IV SOLN
4.0000 g | Freq: Once | INTRAVENOUS | Status: AC
  Administered 2022-03-11: 4 g via INTRAVENOUS
  Filled 2022-03-11: qty 100

## 2022-03-11 MED ORDER — HEPARIN SODIUM (PORCINE) 5000 UNIT/ML IJ SOLN
5000.0000 [IU] | Freq: Three times a day (TID) | INTRAMUSCULAR | Status: DC
Start: 2022-03-11 — End: 2022-03-17
  Administered 2022-03-11 – 2022-03-17 (×17): 5000 [IU] via SUBCUTANEOUS
  Filled 2022-03-11 (×17): qty 1

## 2022-03-11 MED ORDER — INSULIN ASPART 100 UNIT/ML IJ SOLN
0.0000 [IU] | Freq: Three times a day (TID) | INTRAMUSCULAR | Status: DC
  Administered 2022-03-11: 1 [IU] via SUBCUTANEOUS
  Administered 2022-03-11: 3 [IU] via SUBCUTANEOUS
  Administered 2022-03-12: 1 [IU] via SUBCUTANEOUS
  Administered 2022-03-12 – 2022-03-13 (×5): 2 [IU] via SUBCUTANEOUS
  Administered 2022-03-14: 3 [IU] via SUBCUTANEOUS
  Administered 2022-03-14: 2 [IU] via SUBCUTANEOUS
  Administered 2022-03-14: 1 [IU] via SUBCUTANEOUS

## 2022-03-11 MED ORDER — LIVING WELL WITH DIABETES BOOK
Freq: Once | Status: AC
  Filled 2022-03-11 (×2): qty 1

## 2022-03-11 NOTE — Progress Notes (Signed)
Lower extremity venous LT study completed.   Please see CV Proc for preliminary results.   Carli Lefevers, RDMS, RVT  

## 2022-03-11 NOTE — Progress Notes (Signed)
?                                  PROGRESS NOTE                                             ?                                                                                                                     ?                                         ? ? Patient Demographics:  ? ? Jeffrey [name], is a 56 y.o. male, DOB - 1966-05-27, PZW:[id number] ? ?Outpatient Primary MD for the patient is Charlott Rakes, MD    LOS - 0  Admit date - 03/10/2022   ? ?Chief Complaint  ?Patient presents with  ? Hypoglycemia  ?    ? ?Brief Narrative (HPI from H&P)   56 y.o. male with medical history significant of  non ischemic cardiomyopathy, sp AiCD, non-obstructive CAD, CKD, DM2 on insulin, HTN, HLD, CVA presented to the hospital with weakness, confusion found to be hypoglycemic and admitted for further care. ? ? Subjective:  ? ? Jeffrey [name] today has, No headache, No chest pain, No abdominal pain - No Nausea, No new weakness tingling or numbness, No SOB ? ? Assessment  & Plan :  ? ?Weakness, metabolic encephalopathy all stemming from hypoglycemia caused by insulin and oral intake mismatch .  His insulin has been held, his A1c actually suggests poor outpatient control due to hyperglycemia, he is only on low-dose sliding scale, oral diet has been commenced will monitor CBGs closely, diabetic and insulin education, will likely need adjustment of his insulin regimen once his sugars stabilize.  Encephalopathy has improved no acute changes on CT scan.  Advance activity.  PT OT. ? ?Lab Results  ?Component Value Date  ? HGBA1C 9.7 (H) 03/11/2022  ? ? ?CBG (last 3)  ?Recent Labs  ?  03/11/22 ?0750 03/11/22 ?1004 03/11/22 ?1031  ?GLUCAP 99 83 98  ? ? ?2.  AKI on CKD 3B.  Baseline creatinine around 2.2.  Hold his a.m. Lasix dose and monitor. ?  ?3.  Chronic systolic heart failure.  Last EF 35% last year has AICD.  Hold a.m. Lasix dose due to AKI, continue Coreg and Imdur. ? ?4.  Hypertension.  Stable on  Coreg and Imdur along with diuretic as tolerated. ? ?5.  Dyslipidemia.  On statin continue. ? ?6.  BPH.  On Flomax. ? ?7.  Weakness with multiple falls at home.  PT OT. ? ?8.  Lower extremity edema could be from CHF lower extremity  ultrasound pending. ? ? ?   ? ?Condition - Fair ? ?Family Communication  :  None ? ?Code Status :  Full ? ?Consults  :  None ? ?PUD Prophylaxis :  ? ? Procedures  :    ? ?Leg [place] ? ?CT head - Non Acute ? ?   ? ?Disposition Plan  :   ? ?Status is: Observation ? ?DVT Prophylaxis  :  Heparin ? ? ?Lab Results  ?Component Value Date  ? PLT 282 03/11/2022  ? ? ?Diet :  ?Diet Order   ? ?       ?  Diet Heart Room service appropriate? Yes; Fluid consistency: Thin  Diet effective now       ?  ? ?  ?  ? ?  ?  ? ?Inpatient Medications ? ?Scheduled Meds: ? carvedilol  12.5 mg Oral BID WC  ? furosemide  80 mg Oral BID  ? gabapentin  600 mg Oral QHS  ? hydrALAZINE  25 mg Oral TID  ? insulin aspart  0-9 Units Subcutaneous TID WC  ? isosorbide mononitrate  30 mg Oral Daily  ? rosuvastatin  40 mg Oral QHS  ? sodium chloride flush  3 mL Intravenous Q12H  ? tamsulosin  0.4 mg Oral QPC supper  ? ?Continuous Infusions: ? sodium chloride    ? ?PRN Meds:.sodium chloride, acetaminophen **OR** acetaminophen, albuterol, dextrose, HYDROcodone-acetaminophen, sodium chloride flush ? ?Antibiotics  :   ? ?Anti-infectives (From admission, onward)  ? ? None  ? ?  ? ? ? Time Spent in minutes  30 ? ? ?Lala Lund M.D on 03/11/2022 at 10:52 AM ? ?To page go to [email or web address]  ? ?[facility] -  Office  [phone] ? ?See all Orders from today for further details ? ? ? Objective:  ? ?Vitals:  ? 03/10/22 2055 03/11/22 0000 03/11/22 0335 03/11/22 0700  ?BP: (!) 173/111 (!) 148/91 (!) 152/84 134/85  ?Pulse: 76     ?Resp: 18 20    ?Temp: 98.8 ?F (37.1 ?C) 98.6 ?F (37 ?C) 98.5 ?F (36.9 ?C) 98.7 ?F (37.1 ?C)  ?TempSrc: Oral Oral Oral Oral  ?SpO2: 92%   97%  ?Weight: 97.3 kg     ?Height:      ? ? ?Wt Readings from Last 3  Encounters:  ?03/10/22 97.3 kg  ?03/09/22 100.8 kg  ?11/26/21 97.5 kg  ? ? ? ?Intake/Output Summary (Last 24 hours) at 03/11/2022 1052 ?Last data filed at 03/11/2022 0035 ?Gross per 24 hour  ?Intake --  ?Output 250 ml  ?Net -250 ml  ? ? ? ?Physical Exam ? ?Awake Alert, No new F.N deficits, Normal affect ?[place].AT,PERRAL ?Supple Neck, No JVD,   ?Symmetrical Chest wall movement, Good air movement bilaterally, CTAB ?RRR,No Gallops,Rubs or new Murmurs,  ?+ve B.Sounds, Abd Soft, No tenderness,   ?No Cyanosis, Clubbing or edema  ?   ? ? Data Review:  ? ? ?CBC ?Recent Labs  ?Lab 03/06/22 ?0106 03/10/22 ?1500 03/11/22 ?7353  ?WBC 7.3 7.7 7.2  ?HGB 10.7* 11.6* 10.8*  ?HCT 34.8* 39.4 35.0*  ?PLT 243 281 282  ?MCV 82.9 85.3 81.6  ?MCH 25.5* 25.1* 25.2*  ?MCHC 30.7 29.4* 30.9  ?RDW 15.0 15.2 15.3  ?LYMPHSABS 0.8 1.0 1.5  ?MONOABS 0.4 0.4 0.5  ?EOSABS 0.0 0.0 0.1  ?BASOSABS 0.0 0.0 0.0  ? ? ?Electrolytes ?Recent Labs  ?Lab 03/06/22 ?0106 03/09/22 ?1639 03/10/22 ?1500 03/10/22 ?2153 03/11/22 ?2992  ?NA 136 144 138  --  137  ?K 3.0* 4.4 4.2  --  3.9  ?CL 105 108* 108  --  106  ?CO2 25 21 21*  --  21*  ?GLUCOSE 276* 125* 135*  --  89  ?BUN 20 28* 24*  --  27*  ?CREATININE 2.11* 2.32* 2.34*  --  2.42*  ?CALCIUM 8.5* 9.2 8.9  --  9.0  ?AST  --   --  27  --  21  ?ALT  --   --  17  --  16  ?ALKPHOS  --   --  66  --  61  ?BILITOT  --   --  0.7  --  0.5  ?ALBUMIN  --   --  3.0*  --  2.9*  ?MG  --   --   --  1.8 1.6*  ?TSH  --   --   --  0.817 0.831  ?HGBA1C  --   --   --   --  9.7*  ? ? ?------------------------------------------------------------------------------------------------------------------ ?No results for input(s): CHOL, HDL, LDLCALC, TRIG, CHOLHDL, LDLDIRECT in the last 72 hours. ? ?Lab Results  ?Component Value Date  ? HGBA1C 9.7 (H) 03/11/2022  ? ? ?Recent Labs  ?  03/11/22 ?7425  ?TSH 0.831  ? ?------------------------------------------------------------------------------------------------------------------ ?ID Labs ?Recent  Labs  ?Lab 03/06/22 ?0106 03/09/22 ?1639 03/10/22 ?1500 03/11/22 ?9563  ?WBC 7.3  --  7.7 7.2  ?PLT 243  --  281 282  ?CREATININE 2.11* 2.32* 2.34* 2.42*  ? ?Cardiac Enzymes ?No results for input(s): CKMB, TROPONINI, MYOGLOBIN in the last 168 hours. ? ?Invalid input(s): CK ? ? ?Radiology Reports ?CT HEAD WO CONTRAST (5MM) ? ?Result Date: 03/10/2022 ?CLINICAL DATA:  Delirium.  Found unresponsive. EXAM: CT HEAD WITHOUT CONTRAST TECHNIQUE: Contiguous axial images were obtained from the base of the skull through the vertex without intravenous contrast. RADIATION DOSE REDUCTION: This exam was performed according to the departmental dose-optimization program which includes automated exposure control, adjustment of the mA and/or kV according to patient size and/or use of iterative reconstruction technique. COMPARISON:  06/30/2021 FINDINGS: Brain: There is atrophy and chronic small vessel disease changes. Bilateral basal ganglia lacunar infarcts, chronic. No acute intracranial abnormality. Specifically, no hemorrhage, hydrocephalus, mass lesion, acute infarction, or significant intracranial injury. Vascular: No hyperdense vessel or unexpected calcification. Skull: No acute calvarial abnormality. Sinuses/Orbits: No acute findings Other: None IMPRESSION: Atrophy, chronic microvascular disease. No acute intracranial abnormality. Electronically Signed   By: Rolm Baptise M.D.   On: 03/10/2022 20:22  ? ?DG Chest Portable 1 View ? ?Result Date: 03/10/2022 ?CLINICAL DATA:  Hypoglycemia, unresponsive. EXAM: PORTABLE CHEST 1 VIEW COMPARISON:  Chest radiograph June 12, 2020. FINDINGS: Patient is rotated. Left chest AICD with leads over the right ventricle. Right chest generator with leads extending into the neck and tip obscured by collimation. EKG leads overlie the chest. Cardiomegaly with mild central vascular prominence. Slightly reduced lung volumes with bibasilar opacities favor atelectasis. No visible pleural effusion or  pneumothorax. No acute osseous abnormality. IMPRESSION: 1. Cardiomegaly with mild central vascular prominence no overt pulmonary edema. 2. Slightly reduced lung volumes with bibasilar opacities favor atelectasis, althou

## 2022-03-11 NOTE — Progress Notes (Signed)
Hypoglycemic Event ? ?CBG: 57 ? ?Treatment: 4 oz juice/soda ? ?Symptoms: None ? ?Follow-up CBG: Time:2153 CBG Result:40 ? ?Possible Reasons for Event: Unknown ? ?Comments/MD notified:yes ? ? ? ?Jeffrey [name]  [name] ? ? ?

## 2022-03-11 NOTE — Progress Notes (Signed)
Hypoglycemic Event ? ?CBG: 40 ? ?Treatment: D50 25 mL (12.5 gm) ? ?Symptoms: None ? ?Follow-up CBG: Time:2309 CBG Result:114 ? ?Possible Reasons for Event: Unknown ? ?Comments/MD notified:yes ? ? ? ?Jeffrey [name]  [name] ? ? ?

## 2022-03-11 NOTE — Evaluation (Signed)
Occupational Therapy Evaluation ?Patient Details ?Name: Jeffrey [name] ?MRN: [id number] ?DOB: 06-17-1966 ?Today's Date: 03/11/2022 ? ? ?History of Present Illness Pt is a 56 y.o. male with medical history significant of non ischemic cardiomyopathy, sp AiCD, non-obstructive CAD, CKD, DM2, HTN, HLD, CVA.  Presented with   episode of recurrent confusion currently due to hypoglycemia down to 40. Recurrent hypoglycemic episodes at home.  ? ?Clinical Impression ?  ?Pt admitted with above and presents to OT with deficits as listed below.  Pt currently requires min assist for sit > stand and CGA with stand pivot transfers with RW.  Pt reports intermittent utilization of cane at home, but does have RW.  Pt reports intermittent difficulty with sit > stand when he isn't feeling well.  Pt would benefit from being seen acutely for functional mobility, dynamic standing for LB hygiene and dressing, and activity tolerance as pt with intermittent assist at home.  Recommend HHOT vs no f/u pending progress. ?   ? ?Recommendations for follow up therapy are one component of a multi-disciplinary discharge planning process, led by the attending physician.  Recommendations may be updated based on patient status, additional functional criteria and insurance authorization.  ? ?Follow Up Recommendations ? Home health OT  ?  ?Assistance Recommended at Discharge Intermittent Supervision/Assistance  ?Patient can return home with the following A little help with walking and/or transfers;A little help with bathing/dressing/bathroom;Direct supervision/assist for medications management;Assist for transportation ? ?  ?Functional Status Assessment ? Patient has had a recent decline in their functional status and demonstrates the ability to make significant improvements in function in a reasonable and predictable amount of time.  ?Equipment Recommendations ? Tub/shower bench  ?  ?Recommendations for Other Services   ? ? ?  ?Precautions / Restrictions  Precautions ?Precautions: None  ? ?  ? ?Mobility Bed Mobility ?Overal bed mobility: Modified Independent ?  ?  ?  ?  ?  ?  ?  ?  ? ?Transfers ?Overall transfer level: Needs assistance ?Equipment used: Rolling walker (2 wheels) ?Transfers: Sit to/from Stand, Bed to chair/wheelchair/BSC ?Sit to Stand: Min assist ?Stand pivot transfers: Min guard ?  ?  ?  ?  ?  ?  ? ?  ?   ? ?ADL either performed or assessed with clinical judgement  ? ?ADL Overall ADL's : Needs assistance/impaired ?Eating/Feeding: Independent ?  ?  ?  ?  ?  ?Lower Body Bathing: Minimal assistance ?Lower Body Bathing Details (indicate cue type and reason): Min assist for sit > stand ?  ?  ?Lower Body Dressing: Minimal assistance ?Lower Body Dressing Details (indicate cue type and reason): Min assist for sit > stand.  Pt able to don socks in sitting with forward reach ?Toilet Transfer: Min guard;Stand-pivot;Rolling walker (2 wheels) ?  ?  ?  ?  ?  ?Functional mobility during ADLs: Min guard;Rolling walker (2 wheels) ?General ADL Comments: Completed stand pivot transfer with min assist for sit > stand from bed and CGA during stand pivot transfer with RW as pt reports having RW and cane at home.  ? ? ? ?Vision Baseline Vision/History: 1 Wears glasses (reading) ?Ability to See in Adequate Light: 0 Adequate ?Patient Visual Report: No change from baseline ?Vision Assessment?: No apparent visual deficits  ?   ?   ?   ? ?Pertinent Vitals/Pain Pain Assessment ?Pain Assessment: No/denies pain  ? ? ? ?Hand Dominance Left ?  ?Extremity/Trunk Assessment Upper Extremity Assessment ?Upper Extremity Assessment: Overall WFL for tasks  assessed ?  ?Lower Extremity Assessment ?Lower Extremity Assessment: Overall WFL for tasks assessed ?  ?  ?  ?Communication Communication ?Communication: No difficulties ?  ?Cognition Arousal/Alertness: Awake/alert ?Behavior During Therapy: [facility] for tasks assessed/performed ?Overall Cognitive Status: No family/caregiver present to determine  baseline cognitive functioning ?  ?  ?  ?  ?  ?  ?  ?  ?  ?  ?  ?  ?  ?  ?  ?  ?General Comments: cognition appeared within functional limits during evaluation with good interaction and joking.  Pt with tendency to get confused with hypoglycemia but okay during this encounter. ?  ?  ?   ?   ?   ? ? ?Home Living Family/patient expects to be discharged to:: Private residence ?Living Arrangements: Non-relatives/Friends ?Available Help at Discharge: Family;Available 24 hours/day ?Type of Home: Apartment ?Home Access: Stairs to enter ?Entrance Stairs-Number of Steps: 10 ?Entrance Stairs-Rails: Right ?Home Layout: One level ?  ?  ?Bathroom Shower/Tub: Tub/shower unit ?  ?Bathroom Toilet: Standard ?  ?  ?Home Equipment: Conservation officer, nature (2 wheels);Cane - single point ?  ?Additional Comments: Pt reports level entry at home, but is currently staying with friends ?  ? ?  ?Prior Functioning/Environment Prior Level of Function : History of Falls (last six months) ?  ?  ?  ?  ?  ?  ?  ?ADLs Comments: Mod I with bathing and dressing, did some house keeping tasks .   intermittent use of cane with mobility in the home ?  ? ?  ?  ?OT Problem List: Decreased strength;Decreased activity tolerance;Impaired balance (sitting and/or standing);Decreased safety awareness ?  ?   ?OT Treatment/Interventions: Self-care/ADL training;Energy conservation;DME and/or AE instruction;Therapeutic activities;Patient/family education;Balance training  ?  ?OT Goals(Current goals can be found in the care plan section) Acute Rehab OT Goals ?Patient Stated Goal: to feel better ?OT Goal Formulation: With patient ?Time For Goal Achievement: 03/25/22 ?Potential to Achieve Goals: Good  ?OT Frequency: Min 2X/week ?  ? ?   ?AM-PAC OT "6 Clicks" Daily Activity     ?Outcome Measure Help from another person eating meals?: None ?Help from another person taking care of personal grooming?: A Little ?Help from another person toileting, which includes using toliet, bedpan,  or urinal?: A Little ?Help from another person bathing (including washing, rinsing, drying)?: A Little ?Help from another person to put on and taking off regular upper body clothing?: A Little ?Help from another person to put on and taking off regular lower body clothing?: A Little ?6 Click Score: 19 ?  ?End of Session Equipment Utilized During Treatment: Gait belt;Rolling walker (2 wheels) ?Nurse Communication: Mobility status ? ?Activity Tolerance: Patient tolerated treatment well;No increased pain ?Patient left: in chair;with call bell/phone within reach;with chair alarm set ? ?OT Visit Diagnosis: Unsteadiness on feet (R26.81);Repeated falls (R29.6);Muscle weakness (generalized) (M62.81);History of falling (Z91.81)  ?              ?Time: [phone] ?OT Time Calculation (min): 19 min ?Charges:  OT General Charges ?$OT Visit: 1 Visit ?OT Evaluation ?$OT Eval Low Complexity: 1 Low ? ?Jeffrey [name], [place], OTR/L ?[phone] ? ?Jeffrey [name] ?03/11/2022, 10:03 AM ?

## 2022-03-11 NOTE — Progress Notes (Signed)
Hypoglycemic Event ? ?CBG: 52 ? ?Treatment: 4 oz juice/soda ? ?Symptoms: None ? ?Follow-up CBG: [id number] CBG Result:68 ? ?Possible Reasons for Event: Unknown ? ?Comments/MD notified:yes ? ? ? ?Dru Primeau  [name] ? ? ?

## 2022-03-11 NOTE — Progress Notes (Signed)
Inpatient Diabetes Program Recommendations ? ?AACE/ADA: New Consensus Statement on Inpatient Glycemic Control (2015) ? ?Target Ranges:  Prepandial:   less than 140 mg/dL ?     Peak postprandial:   less than 180 mg/dL (1-2 hours) ?     Critically ill patients:  140 - 180 mg/dL  ? ?Lab Results  ?Component Value Date  ? GLUCAP 98 03/11/2022  ? HGBA1C 9.7 (H) 03/11/2022  ? ? ?Review of Glycemic Control ? Latest Reference Range & Units 03/11/22 01:52 03/11/22 03:58 03/11/22 05:53 03/11/22 06:28 03/11/22 06:50 03/11/22 07:50 03/11/22 10:04 03/11/22 10:31  ?Glucose-Capillary 70 - 99 mg/dL 93 97 52 (L) 68 (L) 104 (H) 99 83 98  ? ?Diabetes history: DM2 ?Outpatient Diabetes medications: Lantus 35 units q hs, Novolog 0-9 units tid ?Current orders for Inpatient glycemic control: Novolog 0-9 units tid correction ? ?Inpatient Diabetes Program Recommendations:   ?Spoke with patient regarding insulin administration and reviewed dosing of insulin and discussed need to check CBGs on a regular basis prior to each meal and bedtime. Patient was unclear how much insulin he has been taking and unclear what times he was taking insulin. Reviewed hypoglycemia protocol with patient. ? ?Spoke with Jeffrey [name] on the phone and discussed her assistance with care of patient. ?Jeffrey [name] tries to get patient to allow her to give all insulin injections to patient, but "sometimes he takes insulin into the bathroom and locks the door" and doesn't tell her how much insulin he took. Jeffrey [name] had already made plan for discharge that the insulin be kept in a locked box so she can check patient's CBG and administer insulin as prescribed. ?Patient unable to tell me how often he eats, but Jeffrey [name] plans to assist patient more with nutrition allowing patient small frequent meals instead of large meals. ? ?Ordered living Well with diabetes for patient and fiance review. ? ?Thank you, ?Jeffrey Gasser Jeffrey Kullman, RN, MSN, CDE  ?Diabetes Coordinator ?Inpatient Glycemic  Control Team ?Team Pager [phone] (8am-5pm) ?03/11/2022 11:45 AM ? ? ? ? ?

## 2022-03-11 NOTE — Progress Notes (Signed)
Nutrition Brief Note ? ?RD received a consult for assess nutritional status.  ? ?Pt  reports that his appetite was good at home. Denies any changes in his appetite recently. Pt reports that breakfast went well and that he still had not received lunch yet. Pt denies any nausea or vomiting. ?Pt states that he was unsure if he had lost any weight recently. When asked he states that he had not noticed any of his clothes fitting looser than normal. Per EMR, pt has not had any significant weight loss.  ? ?Current diet order is Heart Healthy. No current meal intakes have been recorded within EMR. Labs and medications reviewed.  ? ?Pt with no other questions or concerns at this time. ?No nutrition interventions warranted at this time. If nutrition issues arise, please consult RD.  ? ?Hermina Barters RD, LDN ?Clinical Dietitian ?See AMiON for contact information.  ? ? ? ?

## 2022-03-11 NOTE — Progress Notes (Signed)
Hypoglycemic Event ? ?CBG:68 ? ?Treatment: 4 oz juice/soda ? ?Symptoms: None ? ?Follow-up CBG: [id number] CBG Result:104 ? ?Possible Reasons for Event: Unknown ? ?Comments/MD notified:yes ? ? ? ?Jeanett Antonopoulos  [name] ? ? ?

## 2022-03-11 NOTE — Evaluation (Signed)
Physical Therapy Evaluation ?Patient Details ?Name: Jeffrey [name] ?MRN: [id number] ?DOB: Jun 18, 1966 ?Today's Date: 03/11/2022 ? ?History of Present Illness ? Pt is a 56 y.o. male with medical history significant of non ischemic cardiomyopathy, sp AiCD, non-obstructive CAD, CKD, DM2, HTN, HLD, CVA.  Presented with   episode of recurrent confusion currently due to hypoglycemia down to 40. Recurrent hypoglycemic episodes at home. ?  ?Clinical Impression ? Pt admitted with above diagnosis. PTA pt mod I mobility/ADLs, occasional use of cane. Pt currently with functional limitations due to the deficits listed below (see PT Problem List). On eval, pt required min assist transfers with RW. Unable to progess gait due to dizziness/not feeling well. Blood sugar 93. Pt will benefit from skilled PT to increase their independence and safety with mobility to allow discharge to the venue listed below.   ?   ?   ? ?Recommendations for follow up therapy are one component of a multi-disciplinary discharge planning process, led by the attending physician.  Recommendations may be updated based on patient status, additional functional criteria and insurance authorization. ? ?Follow Up Recommendations Home health PT ? ?  ?Assistance Recommended at Discharge Intermittent Supervision/Assistance  ?Patient can return home with the following ? A little help with walking and/or transfers;A little help with bathing/dressing/bathroom;Assistance with cooking/housework;Assist for transportation;Help with stairs or ramp for entrance ? ?  ?Equipment Recommendations None recommended by PT  ?Recommendations for Other Services ?    ?  ?Functional Status Assessment Patient has had a recent decline in their functional status and demonstrates the ability to make significant improvements in function in a reasonable and predictable amount of time.  ? ?  ?Precautions / Restrictions Precautions ?Precautions: Fall  ? ?  ? ?Mobility ? Bed Mobility ?Overal bed  mobility: Modified Independent ?  ?  ?  ?  ?  ?  ?  ?  ? ?Transfers ?Overall transfer level: Needs assistance ?Equipment used: Rolling walker (2 wheels) ?Transfers: Sit to/from Stand, Bed to chair/wheelchair/BSC ?Sit to Stand: Min assist ?Stand pivot transfers: Min assist ?  ?  ?  ?  ?General transfer comment: assist to power up and stabilize balance ?  ? ?Ambulation/Gait ?  ?  ?  ?  ?  ?  ?  ?General Gait Details: deferred due to dizziness. Blood sugar 93 but pt had not yet eaten breakfast (at 10:30a). ? ?Stairs ?  ?  ?  ?  ?  ? ?Wheelchair Mobility ?  ? ?Modified Rankin (Stroke Patients Only) ?  ? ?  ? ?Balance Overall balance assessment: Needs assistance ?Sitting-balance support: No upper extremity supported, Feet supported ?Sitting balance-Leahy Scale: Good ?  ?  ?Standing balance support: During functional activity, Bilateral upper extremity supported, Reliant on assistive device for balance ?Standing balance-Leahy Scale: Poor ?  ?  ?  ?  ?  ?  ?  ?  ?  ?  ?  ?  ?   ? ? ? ?Pertinent Vitals/Pain Pain Assessment ?Pain Assessment: No/denies pain  ? ? ?Home Living Family/patient expects to be discharged to:: Private residence ?Living Arrangements: Non-relatives/Friends (girlfriend) ?Available Help at Discharge: Family;Available 24 hours/day ?Type of Home: Apartment ?Home Access: Stairs to enter ?Entrance Stairs-Rails: Right ?Entrance Stairs-Number of Steps: 10 ?  ?Home Layout: One level ?Home Equipment: Conservation officer, nature (2 wheels);Cane - single point ?Additional Comments: Pt reports level entry at home, but is currently staying with friends. His home is a Psychologist, sport and exercise.  ?  ?Prior Function Prior  Level of Function : History of Falls (last six months) ?  ?  ?  ?  ?  ?  ?Mobility Comments: occassional use of cane ?ADLs Comments: Mod I with bathing and dressing, did some house keeping tasks .   intermittent use of cane with mobility in the home ?  ? ? ?Hand Dominance  ? Dominant Hand: Right ? ?  ?Extremity/Trunk  Assessment  ? Upper Extremity Assessment ?Upper Extremity Assessment: Overall WFL for tasks assessed ?  ? ?Lower Extremity Assessment ?Lower Extremity Assessment: Overall WFL for tasks assessed ?  ? ?Cervical / Trunk Assessment ?Cervical / Trunk Assessment: Normal  ?Communication  ? Communication: No difficulties  ?Cognition Arousal/Alertness: Awake/alert ?Behavior During Therapy: Flat affect ?Overall Cognitive Status: No family/caregiver present to determine baseline cognitive functioning ?  ?  ?  ?  ?  ?  ?  ?  ?  ?  ?  ?  ?  ?  ?  ?  ?  ?  ?  ? ?  ?General Comments   ? ?  ?Exercises    ? ?Assessment/Plan  ?  ?PT Assessment Patient needs continued PT services  ?PT Problem List Decreased mobility;Decreased activity tolerance;Decreased balance;Decreased knowledge of use of DME ? ?   ?  ?PT Treatment Interventions Therapeutic activities;Gait training;Therapeutic exercise;Patient/family education;Balance training;Stair training;Functional mobility training   ? ?PT Goals (Current goals can be found in the Care Plan section)  ?Acute Rehab PT Goals ?Patient Stated Goal: not stated ?PT Goal Formulation: With patient ?Time For Goal Achievement: 03/25/22 ?Potential to Achieve Goals: Good ? ?  ?Frequency Min 3X/week ?  ? ? ?Co-evaluation   ?  ?  ?  ?  ? ? ?  ?AM-PAC PT "6 Clicks" Mobility  ?Outcome Measure Help needed turning from your back to your side while in a flat bed without using bedrails?: None ?Help needed moving from lying on your back to sitting on the side of a flat bed without using bedrails?: None ?Help needed moving to and from a bed to a chair (including a wheelchair)?: A Little ?Help needed standing up from a chair using your arms (e.g., wheelchair or bedside chair)?: A Little ?Help needed to walk in hospital room?: A Little ?Help needed climbing 3-5 steps with a railing? : A Lot ?6 Click Score: 19 ? ?  ?End of Session Equipment Utilized During Treatment: Gait belt ?Activity Tolerance: Treatment limited  secondary to medical complications (Comment) (dizziness) ?Patient left: in chair;with chair alarm set;with call bell/phone within reach ?Nurse Communication: Mobility status ?PT Visit Diagnosis: Difficulty in walking, not elsewhere classified (R26.2) ?  ? ?Time: 2841-3244 ?PT Time Calculation (min) (ACUTE ONLY): 12 min ? ? ?Charges:   PT Evaluation ?$PT Eval Moderate Complexity: 1 Mod ?  ?  ?   ? ? ?Lorrin Goodell, PT  ?Office # [phone] ?Pager [phone] ? ? ?Lorriane Shire ?03/11/2022, 11:53 AM ? ?

## 2022-03-12 DIAGNOSIS — E162 Hypoglycemia, unspecified: Secondary | ICD-10-CM | POA: Diagnosis not present

## 2022-03-12 LAB — CBC WITH DIFFERENTIAL/PLATELET
Abs Immature Granulocytes: 0.03 10*3/uL (ref 0.00–0.07)
Basophils Absolute: 0 10*3/uL (ref 0.0–0.1)
Basophils Relative: 1 %
Eosinophils Absolute: 0.1 10*3/uL (ref 0.0–0.5)
Eosinophils Relative: 2 %
HCT: 33.4 % — ABNORMAL LOW (ref 39.0–52.0)
Hemoglobin: 10.3 g/dL — ABNORMAL LOW (ref 13.0–17.0)
Immature Granulocytes: 1 %
Lymphocytes Relative: 29 %
Lymphs Abs: 1.8 10*3/uL (ref 0.7–4.0)
MCH: 25.1 pg — ABNORMAL LOW (ref 26.0–34.0)
MCHC: 30.8 g/dL (ref 30.0–36.0)
MCV: 81.3 fL (ref 80.0–100.0)
Monocytes Absolute: 0.5 10*3/uL (ref 0.1–1.0)
Monocytes Relative: 8 %
Neutro Abs: 3.7 10*3/uL (ref 1.7–7.7)
Neutrophils Relative %: 59 %
Platelets: 295 10*3/uL (ref 150–400)
RBC: 4.11 MIL/uL — ABNORMAL LOW (ref 4.22–5.81)
RDW: 15.3 % (ref 11.5–15.5)
WBC: 6.3 10*3/uL (ref 4.0–10.5)
nRBC: 0 % (ref 0.0–0.2)

## 2022-03-12 LAB — MAGNESIUM: Magnesium: 2.3 mg/dL (ref 1.7–2.4)

## 2022-03-12 LAB — COMPREHENSIVE METABOLIC PANEL
ALT: 16 U/L (ref 0–44)
AST: 16 U/L (ref 15–41)
Albumin: 3 g/dL — ABNORMAL LOW (ref 3.5–5.0)
Alkaline Phosphatase: 64 U/L (ref 38–126)
Anion gap: 7 (ref 5–15)
BUN: 33 mg/dL — ABNORMAL HIGH (ref 6–20)
CO2: 26 mmol/L (ref 22–32)
Calcium: 9 mg/dL (ref 8.9–10.3)
Chloride: 105 mmol/L (ref 98–111)
Creatinine, Ser: 2.84 mg/dL — ABNORMAL HIGH (ref 0.61–1.24)
GFR, Estimated: 25 mL/min — ABNORMAL LOW (ref >60)
Glucose, Bld: 203 mg/dL — ABNORMAL HIGH (ref 70–99)
Potassium: 4.1 mmol/L (ref 3.5–5.1)
Sodium: 138 mmol/L (ref 135–145)
Total Bilirubin: <0.1 mg/dL — ABNORMAL LOW (ref 0.3–1.2)
Total Protein: 6.7 g/dL (ref 6.5–8.1)

## 2022-03-12 LAB — GLUCOSE, CAPILLARY
Glucose-Capillary: 136 mg/dL — ABNORMAL HIGH (ref 70–99)
Glucose-Capillary: 193 mg/dL — ABNORMAL HIGH (ref 70–99)
Glucose-Capillary: 162 mg/dL — ABNORMAL HIGH (ref 70–99)
Glucose-Capillary: 157 mg/dL — ABNORMAL HIGH (ref 70–99)

## 2022-03-12 LAB — C-PEPTIDE: C-Peptide: 1.2 ng/mL (ref 1.1–4.4)

## 2022-03-12 MED ORDER — LACTATED RINGERS IV SOLN: INTRAVENOUS | Status: AC

## 2022-03-12 MED ORDER — FUROSEMIDE 40 MG PO TABS: 80.0000 mg | ORAL_TABLET | Freq: Two times a day (BID) | ORAL | Status: DC

## 2022-03-12 NOTE — Progress Notes (Signed)
Occupational Therapy Treatment ?Patient Details ?Name: Jeffrey [name] ?MRN: [id number] ?DOB: 1966/01/18 ?Today's Date: 03/12/2022 ? ? ?History of present illness Pt is a 56 y.o. male with medical history significant of non ischemic cardiomyopathy, sp AiCD, non-obstructive CAD, CKD, DM2, HTN, HLD, CVA.  Presented with   episode of recurrent confusion currently due to hypoglycemia down to 40. Recurrent hypoglycemic episodes at home. ?  ?OT comments ? Pt. Seen for skilled OT treatment session.  Pt. Able to complete LB dressing set up seated, cues for safety.  Toileting tasks min guard a.  Pt. With heavy reliance on furniture walking during session.  Educated on safe surfaces to hold onto. Pt. Reports he walks with cane at home. Relayed this information to PT to use cane during their session in prep for safe return home.    ? ?Recommendations for follow up therapy are one component of a multi-disciplinary discharge planning process, led by the attending physician.  Recommendations may be updated based on patient status, additional functional criteria and insurance authorization. ?   ?Follow Up Recommendations ? Home health OT  ?  ?Assistance Recommended at Discharge Intermittent Supervision/Assistance  ?Patient can return home with the following ? A little help with walking and/or transfers;A little help with bathing/dressing/bathroom;Direct supervision/assist for medications management;Assist for transportation ?  ?Equipment Recommendations ? Tub/shower bench  ?  ?Recommendations for Other Services   ? ?  ?Precautions / Restrictions Precautions ?Precautions: Fall  ? ? ?  ? ?Mobility Bed Mobility ?  ?  ?  ?  ?  ?  ?  ?General bed mobility comments: seated in chair upon arrival to room and end of session ?  ? ?Transfers ?Overall transfer level: Needs assistance ?Equipment used: None ?Transfers: Sit to/from Stand, Bed to chair/wheelchair/BSC ?Sit to Stand: Min guard ?Stand pivot transfers: Min guard ?  ?  ?  ?  ?General  transfer comment: pt. reports he usually uses cane.  provided this information with PT prior to their session with him for proper DME use ?  ?  ?Balance   ?  ?  ?  ?  ?  ?  ?  ?  ?  ?  ?  ?  ?  ?  ?  ?  ?  ?  ?   ? ?ADL either performed or assessed with clinical judgement  ? ?ADL Overall ADL's : Needs assistance/impaired ?  ?  ?  ?  ?  ?Upper Body Bathing Details (indicate cue type and reason): cna and pt. report pt. completed with set up prior to my arrival ?  ?Lower Body Bathing Details (indicate cue type and reason): cna and pt. report pt. completed with set up prior to my arrival ?  ?  ?Lower Body Dressing: Set up;Sitting/lateral leans;Cueing for safety ?Lower Body Dressing Details (indicate cue type and reason): pt. demonstrated don/doff shoes. educated on tech. for not bending forward to feet which can be a fall risk.  donned disposable scrub pants with set up prior to my arrival. reported no issues ?Toilet Transfer: Min guard;Ambulation ?Toilet Transfer Details (indicate cue type and reason): heavy reliance on furniture walking, no dme used for ambulation.  reviewed safe items to hold onto vs. unsafe items ?  ?  ?  ?  ?Functional mobility during ADLs: Min guard ?General ADL Comments: lb dressing task, and toileting demo in the b.room ?  ? ?Extremity/Trunk Assessment   ?  ?  ?  ?  ?  ? ?Vision   ?  ?  ?  Perception   ?  ?Praxis   ?  ? ?Cognition Arousal/Alertness: Awake/alert ?Behavior During Therapy: [facility] for tasks assessed/performed ?Overall Cognitive Status: No family/caregiver present to determine baseline cognitive functioning ?  ?  ?  ?  ?  ?  ?  ?  ?  ?  ?  ?  ?  ?  ?  ?  ?  ?  ?  ?   ?Exercises   ? ?  ?Shoulder Instructions   ? ? ?  ?General Comments VSS  ? ? ?Pertinent Vitals/ Pain       Pain Assessment ?Pain Assessment: No/denies pain ? ?Home Living   ?  ?  ?  ?  ?  ?  ?  ?  ?  ?  ?  ?  ?  ?  ?  ?  ?  ?  ? ?  ?Prior Functioning/Environment    ?  ?  ?  ?   ? ?Frequency ? Min 2X/week  ? ? ? ? ?  ?Progress  Toward Goals ? ?OT Goals(current goals can now be found in the care plan section) ? Progress towards OT goals: Progressing toward goals ? ?   ?Plan Discharge plan remains appropriate   ? ?Co-evaluation ? ? ?   ?  ?  ?  ?  ? ?  ?AM-PAC OT "6 Clicks" Daily Activity     ?Outcome Measure ? ? Help from another person eating meals?: None ?Help from another person taking care of personal grooming?: A Little ?Help from another person toileting, which includes using toliet, bedpan, or urinal?: A Little ?Help from another person bathing (including washing, rinsing, drying)?: A Little ?Help from another person to put on and taking off regular upper body clothing?: A Little ?Help from another person to put on and taking off regular lower body clothing?: A Little ?6 Click Score: 19 ? ?  ?End of Session   ? ?OT Visit Diagnosis: Unsteadiness on feet (R26.81);Repeated falls (R29.6);Muscle weakness (generalized) (M62.81);History of falling (Z91.81) ?  ?Activity Tolerance Patient tolerated treatment well ?  ?Patient Left in chair;with call bell/phone within reach ?  ?Nurse Communication   ?  ? ?   ? ?Time: 1050-1100 ?OT Time Calculation (min): 10 min ? ?Charges: OT General Charges ?$OT Visit: 1 Visit ?OT Treatments ?$Self Care/Home Management : 8-22 mins ? ?Sonia Baller, COTA/L ?Acute Rehabilitation ?[phone]  ? ?Tanya Nones ?03/12/2022, 12:33 PM ?

## 2022-03-12 NOTE — Progress Notes (Signed)
Physical Therapy Treatment ?Patient Details ?Name: Jeffrey [name] ?MRN: [id number] ?DOB: Feb 27, 1966 ?Today's Date: 03/12/2022 ? ? ?History of Present Illness Pt is a 56 y.o. male with medical history significant of non ischemic cardiomyopathy, sp AiCD, non-obstructive CAD, CKD, DM2, HTN, HLD, CVA.  Presented with   episode of recurrent confusion currently due to hypoglycemia down to 40. Recurrent hypoglycemic episodes at home. ? ?  ?PT Comments  ? ? Patient reports using a cane inside home "most of the time." Provided with practice cane and pt demonstrated good knowledge of safe use and no imbalance x 400 ft of ambulation. Patient and PT deferred stair training as pt reports no difficulty and does not need to practice.  ?   ?Recommendations for follow up therapy are one component of a multi-disciplinary discharge planning process, led by the attending physician.  Recommendations may be updated based on patient status, additional functional criteria and insurance authorization. ? ?Follow Up Recommendations ? No PT follow up ?  ?  ?Assistance Recommended at Discharge PRN  ?Patient can return home with the following   ?  ?Equipment Recommendations ? None recommended by PT  ?  ?Recommendations for Other Services   ? ? ?  ?Precautions / Restrictions Precautions ?Precautions: Fall  ?  ? ?Mobility ? Bed Mobility ?Overal bed mobility: Modified Independent ?  ?  ?  ?  ?  ?  ?  ?  ? ?Transfers ?Overall transfer level: Needs assistance ?Equipment used: Straight cane ?Transfers: Sit to/from Stand, Bed to chair/wheelchair/BSC ?Sit to Stand: Modified independent (Device/Increase time) ?  ?  ?  ?  ?  ?General transfer comment: no assist needed with use of cane x 2 reps ?  ? ?Ambulation/Gait ?Ambulation/Gait assistance: Modified independent (Device/Increase time) ?Gait Distance (Feet): 400 Feet ?Assistive device: Straight cane ?Gait Pattern/deviations: WFL(Within Functional Limits) ?  ?Gait velocity interpretation: 1.31 - 2.62  ft/sec, indicative of limited community ambulator ?  ?General Gait Details: pt with proper use of cane even when he switches hands ? ? ?Stairs ?  ?  ?  ?  ?  ? ? ?Wheelchair Mobility ?  ? ?Modified Rankin (Stroke Patients Only) ?  ? ? ?  ?Balance Overall balance assessment: Needs assistance ?Sitting-balance support: No upper extremity supported, Feet supported ?Sitting balance-Leahy Scale: Good ?  ?  ?Standing balance support: During functional activity, Reliant on assistive device for balance, Single extremity supported ?Standing balance-Leahy Scale: Poor ?  ?  ?  ?  ?  ?  ?  ?  ?  ?  ?  ?  ?  ? ?  ?Cognition Arousal/Alertness: Awake/alert ?Behavior During Therapy: [facility] for tasks assessed/performed ?Overall Cognitive Status: No family/caregiver present to determine baseline cognitive functioning ?  ?  ?  ?  ?  ?  ?  ?  ?  ?  ?  ?  ?  ?  ?  ?  ?General Comments: cognition appeared within functional limits with good interaction and joking.  Pt with tendency to get confused with hypoglycemia but okay during this encounter. ?  ?  ? ?  ?Exercises   ? ?  ?General Comments General comments (skin integrity, edema, etc.): VSS ?  ?  ? ?Pertinent Vitals/Pain Pain Assessment ?Pain Assessment: No/denies pain  ? ? ?Home Living   ?  ?  ?  ?  ?  ?  ?  ?  ?  ?   ?  ?Prior Function    ?  ?  ?   ? ?  PT Goals (current goals can now be found in the care plan section) Acute Rehab PT Goals ?Patient Stated Goal: not stated ?PT Goal Formulation: With patient ?Time For Goal Achievement: 03/25/22 ?Potential to Achieve Goals: Good ?Progress towards PT goals: Goals met/education completed, patient discharged from PT ? ?  ?Frequency ? ? ? Min 3X/week ? ? ? ?  ?PT Plan Discharge plan needs to be updated  ? ? ?Co-evaluation   ?  ?  ?  ?  ? ?  ?AM-PAC PT "6 Clicks" Mobility   ?Outcome Measure ? Help needed turning from your back to your side while in a flat bed without using bedrails?: None ?Help needed moving from lying on your back to sitting on  the side of a flat bed without using bedrails?: None ?Help needed moving to and from a bed to a chair (including a wheelchair)?: None ?Help needed standing up from a chair using your arms (e.g., wheelchair or bedside chair)?: None ?Help needed to walk in hospital room?: None ?Help needed climbing 3-5 steps with a railing? : None ?6 Click Score: 24 ? ?  ?End of Session   ?Activity Tolerance: Patient tolerated treatment well ?Patient left: in chair;with chair alarm set;with call bell/phone within reach ?Nurse Communication: Mobility status ?PT Visit Diagnosis: Difficulty in walking, not elsewhere classified (R26.2) ?  ? ? ?Time: 4332-9518 ?PT Time Calculation (min) (ACUTE ONLY): 14 min ? ?Charges:  $Gait Training: 8-22 mins          ?          ? ? ?Arby Barrette, PT ?Acute Rehabilitation Services  ?Pager [phone] ?Office [phone] ? ? ? ?Jeanie Cooks Nahiara Kretzschmar ?03/12/2022, 12:14 PM ? ?

## 2022-03-12 NOTE — Progress Notes (Signed)
?                                  PROGRESS NOTE                                             ?                                                                                                                     ?                                         ? ? Patient Demographics:  ? ? Jeffrey [name], is a 56 y.o. male, DOB - 1966/11/19, DHR:[id number] ? ?Outpatient Primary MD for the patient is Charlott Rakes, MD    LOS - 0  Admit date - 03/10/2022   ? ?Chief Complaint  ?Patient presents with  ? Hypoglycemia  ?    ? ?Brief Narrative (HPI from H&P)   56 y.o. male with medical history significant of  non ischemic cardiomyopathy, sp AiCD, non-obstructive CAD, CKD, DM2 on insulin, HTN, HLD, CVA presented to the hospital with weakness, confusion found to be hypoglycemic and admitted for further care. ? ? Subjective:  ? ?Patient in bed, appears comfortable, denies any headache, no fever, no chest pain or pressure, no shortness of breath , no abdominal pain. No new focal weakness.  ? ? Assessment  & Plan :  ? ?Weakness, metabolic encephalopathy all stemming from hypoglycemia caused by insulin and oral intake mismatch .  His insulin has been held, his A1c actually suggests poor outpatient control due to hyperglycemia, he is only on low-dose sliding scale, oral diet has been commenced will monitor CBGs closely, diabetic and insulin education, will likely need adjustment of his insulin regimen once his sugars stabilize.  Encephalopathy has improved no acute changes on CT scan.  Advance activity.  PT OT. ? ?Lab Results  ?Component Value Date  ? HGBA1C 9.7 (H) 03/11/2022  ? ? ?CBG (last 3)  ?Recent Labs  ?  03/11/22 ?1546 03/11/22 ?2148 03/12/22 ?0838  ?GLUCAP 136* 213* 136*  ? ? ?2.  AKI on CKD 3B.  Baseline creatinine around 2.2.  See #3 below, gentle IV fluids about 500 cc on 03/12/2022. ?  ?3.  Chronic systolic heart failure.  Last EF 35% last year has AICD.  Hold Entresto and Lasix dose due to  AKI on 03/12/2022, continue Coreg and Imdur. ? ?4.  Hypertension.  Stable on Coreg and Imdur along with diuretic as tolerated. ? ?5.  Dyslipidemia.  On statin continue. ? ?6.  BPH.  On Flomax. ? ?7.  Weakness with multiple falls at home.  PT OT. ? ?  8.  Lower extremity edema - Leg [place] -ve, resolved. ? ? ?   ? ?Condition - Fair ? ?Family Communication  :  None ? ?Code Status :  Full ? ?Consults  :  None ? ?PUD Prophylaxis :  ? ? Procedures  :    ? ?Leg [place] - No DVT. ? ?CT head - Non Acute ? ?   ? ?Disposition Plan  :   ? ?Status is: Observation ? ?DVT Prophylaxis  :  Heparin ? ? ?Lab Results  ?Component Value Date  ? PLT 295 03/12/2022  ? ? ?Diet :  ?Diet Order   ? ?       ?  Diet Heart Room service appropriate? Yes; Fluid consistency: Thin  Diet effective now       ?  ? ?  ?  ? ?  ?  ? ?Inpatient Medications ? ?Scheduled Meds: ? carvedilol  12.5 mg Oral BID WC  ? [START ON 03/13/2022] furosemide  80 mg Oral BID  ? gabapentin  600 mg Oral QHS  ? heparin injection (subcutaneous)  5,000 Units Subcutaneous Q8H  ? hydrALAZINE  25 mg Oral TID  ? insulin aspart  0-9 Units Subcutaneous TID WC  ? isosorbide mononitrate  30 mg Oral Daily  ? rosuvastatin  40 mg Oral QHS  ? sodium chloride flush  3 mL Intravenous Q12H  ? tamsulosin  0.4 mg Oral QPC supper  ? ?Continuous Infusions: ? sodium chloride    ? lactated ringers    ? ?PRN Meds:.sodium chloride, acetaminophen **OR** acetaminophen, albuterol, dextrose, HYDROcodone-acetaminophen, sodium chloride flush ? ?Antibiotics  :   ? ?Anti-infectives (From admission, onward)  ? ? None  ? ?  ? ? ? Time Spent in minutes  30 ? ? ?Lala Lund M.D on 03/12/2022 at 10:38 AM ? ?To page go to [email or web address]  ? ?[facility] -  Office  [phone] ? ?See all Orders from today for further details ? ? ? Objective:  ? ?Vitals:  ? 03/11/22 2257 03/11/22 2358 03/12/22 0330 03/12/22 1761  ?BP: 137/90 (!) 155/89 (!) 152/92 (!) 175/98  ?Pulse:  87    ?Resp:  18 20 20   ?Temp:  98.4 ?F (36.9 ?C) 98.5  ?F (36.9 ?C) 98.3 ?F (36.8 ?C)  ?TempSrc:  Oral Oral Oral  ?SpO2:  95% 96% 92%  ?Weight:      ?Height:      ? ? ?Wt Readings from Last 3 Encounters:  ?03/10/22 97.3 kg  ?03/09/22 100.8 kg  ?11/26/21 97.5 kg  ? ? ? ?Intake/Output Summary (Last 24 hours) at 03/12/2022 1038 ?Last data filed at 03/12/2022 1016 ?Gross per 24 hour  ?Intake 0 ml  ?Output 500 ml  ?Net -500 ml  ? ? ? ?Physical Exam ? ?Awake Alert, No new F.N deficits, Normal affect ?[place].AT,PERRAL ?Supple Neck, No JVD,   ?Symmetrical Chest wall movement, Good air movement bilaterally, CTAB ?RRR,No Gallops, Rubs or new Murmurs,  ?+ve B.Sounds, Abd Soft, No tenderness,   ?No Cyanosis, Clubbing or edema  ?   ? ? Data Review:  ? ? ?CBC ?Recent Labs  ?Lab 03/06/22 ?0106 03/10/22 ?1500 03/11/22 ?6073 03/12/22 ?7106  ?WBC 7.3 7.7 7.2 6.3  ?HGB 10.7* 11.6* 10.8* 10.3*  ?HCT 34.8* 39.4 35.0* 33.4*  ?PLT 243 281 282 295  ?MCV 82.9 85.3 81.6 81.3  ?MCH 25.5* 25.1* 25.2* 25.1*  ?MCHC 30.7 29.4* 30.9 30.8  ?RDW 15.0 15.2 15.3 15.3  ?LYMPHSABS 0.8 1.0 1.5 1.8  ?  MONOABS 0.4 0.4 0.5 0.5  ?EOSABS 0.0 0.0 0.1 0.1  ?BASOSABS 0.0 0.0 0.0 0.0  ? ? ?Electrolytes ?Recent Labs  ?Lab 03/06/22 ?0106 03/09/22 ?1639 03/10/22 ?1500 03/10/22 ?2153 03/11/22 ?1700 03/12/22 ?1749  ?NA 136 144 138  --  137 138  ?K 3.0* 4.4 4.2  --  3.9 4.1  ?CL 105 108* 108  --  106 105  ?CO2 25 21 21*  --  21* 26  ?GLUCOSE 276* 125* 135*  --  89 203*  ?BUN 20 28* 24*  --  27* 33*  ?CREATININE 2.11* 2.32* 2.34*  --  2.42* 2.84*  ?CALCIUM 8.5* 9.2 8.9  --  9.0 9.0  ?AST  --   --  27  --  21 16  ?ALT  --   --  17  --  16 16  ?ALKPHOS  --   --  66  --  61 64  ?BILITOT  --   --  0.7  --  0.5 <0.1*  ?ALBUMIN  --   --  3.0*  --  2.9* 3.0*  ?MG  --   --   --  1.8 1.6* 2.3  ?TSH  --   --   --  0.817 0.831  --   ?HGBA1C  --   --   --   --  9.7*  --   ? ? ?------------------------------------------------------------------------------------------------------------------ ?No results for input(s): CHOL, HDL, LDLCALC, TRIG,  CHOLHDL, LDLDIRECT in the last 72 hours. ? ?Lab Results  ?Component Value Date  ? HGBA1C 9.7 (H) 03/11/2022  ? ? ?Recent Labs  ?  03/11/22 ?4496  ?TSH 0.831  ? ?------------------------------------------------------------------------------------------------------------------ ?ID Labs ?Recent Labs  ?Lab 03/06/22 ?0106 03/09/22 ?1639 03/10/22 ?1500 03/11/22 ?7591 03/12/22 ?6384  ?WBC 7.3  --  7.7 7.2 6.3  ?PLT 243  --  281 282 295  ?CREATININE 2.11* 2.32* 2.34* 2.42* 2.84*  ? ?Cardiac Enzymes ?No results for input(s): CKMB, TROPONINI, MYOGLOBIN in the last 168 hours. ? ?Invalid input(s): CK ? ? ?Radiology Reports ?CT HEAD WO CONTRAST (5MM) ? ?Result Date: 03/10/2022 ?CLINICAL DATA:  Delirium.  Found unresponsive. EXAM: CT HEAD WITHOUT CONTRAST TECHNIQUE: Contiguous axial images were obtained from the base of the skull through the vertex without intravenous contrast. RADIATION DOSE REDUCTION: This exam was performed according to the departmental dose-optimization program which includes automated exposure control, adjustment of the mA and/or kV according to patient size and/or use of iterative reconstruction technique. COMPARISON:  06/30/2021 FINDINGS: Brain: There is atrophy and chronic small vessel disease changes. Bilateral basal ganglia lacunar infarcts, chronic. No acute intracranial abnormality. Specifically, no hemorrhage, hydrocephalus, mass lesion, acute infarction, or significant intracranial injury. Vascular: No hyperdense vessel or unexpected calcification. Skull: No acute calvarial abnormality. Sinuses/Orbits: No acute findings Other: None IMPRESSION: Atrophy, chronic microvascular disease. No acute intracranial abnormality. Electronically Signed   By: Rolm Baptise M.D.   On: 03/10/2022 20:22  ? ?DG Chest Portable 1 View ? ?Result Date: 03/10/2022 ?CLINICAL DATA:  Hypoglycemia, unresponsive. EXAM: PORTABLE CHEST 1 VIEW COMPARISON:  Chest radiograph June 12, 2020. FINDINGS: Patient is rotated. Left chest AICD  with leads over the right ventricle. Right chest generator with leads extending into the neck and tip obscured by collimation. EKG leads overlie the chest. Cardiomegaly with mild central vascular prominence

## 2022-03-12 NOTE — Progress Notes (Signed)
PT Cancellation Note ? ?Patient Details ?Name: Jeffrey [name] ?MRN: [id number] ?DOB: Mar 17, 1966 ? ? ?Cancelled Treatment:    Reason Eval/Treat Not Completed: Patient declined, no reason specified ? ?Patient refused to work with PT and refused to explain why. Began making a phone call while PT trying to discuss with him. Will return as schedule permits. ? ? ?Arby Barrette, PT ?Acute Rehabilitation Services  ?Pager [phone] ?Office [phone] ? ? ?Jeanie Cooks [name] ?03/12/2022, 9:50 AM ?

## 2022-03-13 ENCOUNTER — Observation Stay (HOSPITAL_COMMUNITY): Payer: Medicare Other

## 2022-03-13 DIAGNOSIS — I2510 Atherosclerotic heart disease of native coronary artery without angina pectoris: Secondary | ICD-10-CM | POA: Diagnosis present

## 2022-03-13 DIAGNOSIS — N179 Acute kidney failure, unspecified: Secondary | ICD-10-CM | POA: Diagnosis present

## 2022-03-13 DIAGNOSIS — Z87442 Personal history of urinary calculi: Secondary | ICD-10-CM | POA: Diagnosis not present

## 2022-03-13 DIAGNOSIS — Z8249 Family history of ischemic heart disease and other diseases of the circulatory system: Secondary | ICD-10-CM | POA: Diagnosis not present

## 2022-03-13 DIAGNOSIS — I428 Other cardiomyopathies: Secondary | ICD-10-CM | POA: Diagnosis present

## 2022-03-13 DIAGNOSIS — Z794 Long term (current) use of insulin: Secondary | ICD-10-CM | POA: Diagnosis not present

## 2022-03-13 DIAGNOSIS — E162 Hypoglycemia, unspecified: Secondary | ICD-10-CM | POA: Diagnosis not present

## 2022-03-13 DIAGNOSIS — G9341 Metabolic encephalopathy: Secondary | ICD-10-CM | POA: Diagnosis present

## 2022-03-13 DIAGNOSIS — Z7982 Long term (current) use of aspirin: Secondary | ICD-10-CM | POA: Diagnosis not present

## 2022-03-13 DIAGNOSIS — I5042 Chronic combined systolic (congestive) and diastolic (congestive) heart failure: Secondary | ICD-10-CM | POA: Diagnosis not present

## 2022-03-13 DIAGNOSIS — E876 Hypokalemia: Secondary | ICD-10-CM | POA: Diagnosis present

## 2022-03-13 DIAGNOSIS — Z95810 Presence of automatic (implantable) cardiac defibrillator: Secondary | ICD-10-CM | POA: Diagnosis not present

## 2022-03-13 DIAGNOSIS — E1122 Type 2 diabetes mellitus with diabetic chronic kidney disease: Secondary | ICD-10-CM | POA: Diagnosis present

## 2022-03-13 DIAGNOSIS — E1140 Type 2 diabetes mellitus with diabetic neuropathy, unspecified: Secondary | ICD-10-CM | POA: Diagnosis present

## 2022-03-13 DIAGNOSIS — I5043 Acute on chronic combined systolic (congestive) and diastolic (congestive) heart failure: Secondary | ICD-10-CM | POA: Diagnosis present

## 2022-03-13 DIAGNOSIS — Z833 Family history of diabetes mellitus: Secondary | ICD-10-CM | POA: Diagnosis not present

## 2022-03-13 DIAGNOSIS — E11649 Type 2 diabetes mellitus with hypoglycemia without coma: Secondary | ICD-10-CM | POA: Diagnosis present

## 2022-03-13 DIAGNOSIS — N1832 Chronic kidney disease, stage 3b: Secondary | ICD-10-CM | POA: Diagnosis present

## 2022-03-13 DIAGNOSIS — Z20822 Contact with and (suspected) exposure to covid-19: Secondary | ICD-10-CM | POA: Diagnosis present

## 2022-03-13 DIAGNOSIS — Z79899 Other long term (current) drug therapy: Secondary | ICD-10-CM | POA: Diagnosis not present

## 2022-03-13 DIAGNOSIS — D631 Anemia in chronic kidney disease: Secondary | ICD-10-CM | POA: Diagnosis present

## 2022-03-13 DIAGNOSIS — I130 Hypertensive heart and chronic kidney disease with heart failure and stage 1 through stage 4 chronic kidney disease, or unspecified chronic kidney disease: Secondary | ICD-10-CM | POA: Diagnosis present

## 2022-03-13 DIAGNOSIS — I252 Old myocardial infarction: Secondary | ICD-10-CM | POA: Diagnosis not present

## 2022-03-13 DIAGNOSIS — E1165 Type 2 diabetes mellitus with hyperglycemia: Secondary | ICD-10-CM | POA: Diagnosis present

## 2022-03-13 DIAGNOSIS — E785 Hyperlipidemia, unspecified: Secondary | ICD-10-CM | POA: Diagnosis present

## 2022-03-13 LAB — CBC WITH DIFFERENTIAL/PLATELET
Abs Immature Granulocytes: 0.02 10*3/uL (ref 0.00–0.07)
Basophils Absolute: 0 10*3/uL (ref 0.0–0.1)
Basophils Relative: 1 %
Eosinophils Absolute: 0.1 10*3/uL (ref 0.0–0.5)
Eosinophils Relative: 2 %
HCT: 32.8 % — ABNORMAL LOW (ref 39.0–52.0)
Hemoglobin: 10.1 g/dL — ABNORMAL LOW (ref 13.0–17.0)
Immature Granulocytes: 0 %
Lymphocytes Relative: 30 %
Lymphs Abs: 1.6 10*3/uL (ref 0.7–4.0)
MCH: 25.1 pg — ABNORMAL LOW (ref 26.0–34.0)
MCHC: 30.8 g/dL (ref 30.0–36.0)
MCV: 81.6 fL (ref 80.0–100.0)
Monocytes Absolute: 0.5 10*3/uL (ref 0.1–1.0)
Monocytes Relative: 9 %
Neutro Abs: 3 10*3/uL (ref 1.7–7.7)
Neutrophils Relative %: 58 %
Platelets: 260 10*3/uL (ref 150–400)
RBC: 4.02 MIL/uL — ABNORMAL LOW (ref 4.22–5.81)
RDW: 15.2 % (ref 11.5–15.5)
WBC: 5.2 10*3/uL (ref 4.0–10.5)
nRBC: 0 % (ref 0.0–0.2)

## 2022-03-13 LAB — COMPREHENSIVE METABOLIC PANEL
ALT: 15 U/L (ref 0–44)
AST: 15 U/L (ref 15–41)
Albumin: 2.9 g/dL — ABNORMAL LOW (ref 3.5–5.0)
Alkaline Phosphatase: 61 U/L (ref 38–126)
Anion gap: 8 (ref 5–15)
BUN: 34 mg/dL — ABNORMAL HIGH (ref 6–20)
CO2: 24 mmol/L (ref 22–32)
Calcium: 8.8 mg/dL — ABNORMAL LOW (ref 8.9–10.3)
Chloride: 106 mmol/L (ref 98–111)
Creatinine, Ser: 2.77 mg/dL — ABNORMAL HIGH (ref 0.61–1.24)
GFR, Estimated: 26 mL/min — ABNORMAL LOW (ref >60)
Glucose, Bld: 208 mg/dL — ABNORMAL HIGH (ref 70–99)
Potassium: 4.3 mmol/L (ref 3.5–5.1)
Sodium: 138 mmol/L (ref 135–145)
Total Bilirubin: 0.1 mg/dL — ABNORMAL LOW (ref 0.3–1.2)
Total Protein: 6.5 g/dL (ref 6.5–8.1)

## 2022-03-13 LAB — GLUCOSE, CAPILLARY
Glucose-Capillary: 184 mg/dL — ABNORMAL HIGH (ref 70–99)
Glucose-Capillary: 196 mg/dL — ABNORMAL HIGH (ref 70–99)
Glucose-Capillary: 191 mg/dL — ABNORMAL HIGH (ref 70–99)
Glucose-Capillary: 209 mg/dL — ABNORMAL HIGH (ref 70–99)

## 2022-03-13 LAB — MAGNESIUM: Magnesium: 2.1 mg/dL (ref 1.7–2.4)

## 2022-03-13 MED ORDER — HYDRALAZINE HCL 50 MG PO TABS
50.0000 mg | ORAL_TABLET | Freq: Three times a day (TID) | ORAL | Status: DC
  Administered 2022-03-13 – 2022-03-14 (×3): 50 mg via ORAL
  Filled 2022-03-13 (×3): qty 1

## 2022-03-13 MED ORDER — LACTATED RINGERS IV SOLN: INTRAVENOUS | Status: AC

## 2022-03-13 MED ORDER — FUROSEMIDE 40 MG PO TABS: 80.0000 mg | ORAL_TABLET | Freq: Two times a day (BID) | ORAL | Status: DC

## 2022-03-13 NOTE — Progress Notes (Signed)
?                                  PROGRESS NOTE                                             ?                                                                                                                     ?                                         ? ? Patient Demographics:  ? ? Jeffrey [name], is a 56 y.o. male, DOB - 03-17-1966, DXI:[id number] ? ?Outpatient Primary MD for the patient is Charlott Rakes, MD    LOS - 0  Admit date - 03/10/2022   ? ?Chief Complaint  ?Patient presents with  ? Hypoglycemia  ?    ? ?Brief Narrative (HPI from H&P)   56 y.o. male with medical history significant of  non ischemic cardiomyopathy, sp AiCD, non-obstructive CAD, CKD, DM2 on insulin, HTN, HLD, CVA presented to the hospital with weakness, confusion found to be hypoglycemic and admitted for further care. ? ? Subjective:  ? ?Patient in bed, appears comfortable, denies any headache, no fever, no chest pain or pressure, no shortness of breath , no abdominal pain. No new focal weakness. ? ? Assessment  & Plan :  ? ?Weakness, metabolic encephalopathy all stemming from hypoglycemia caused by insulin and oral intake mismatch .  His insulin has been held, his A1c actually suggests poor outpatient control due to hyperglycemia, he is only on low-dose sliding scale, oral diet has been commenced will monitor CBGs closely, diabetic and insulin education, will likely need adjustment of his insulin regimen once his sugars stabilize.  Encephalopathy has improved no acute changes on CT scan.  Advance activity.  PT OT. ? ?Lab Results  ?Component Value Date  ? HGBA1C 9.7 (H) 03/11/2022  ? ? ?CBG (last 3)  ?Recent Labs  ?  03/12/22 ?1655 03/12/22 ?2137 03/13/22 ?7673  ?GLUCAP 162* 157* 184*  ? ? ?2.  AKI on CKD 3B.  Baseline creatinine around 2.2.  See #3 below, continue IVF and monitor. ?  ?3.  Chronic systolic heart failure.  Last EF 35% last year has AICD.  Hold Entresto and Lasix dose due to AKI on 03/12/2022,  continue Coreg and Imdur. ? ?4.  Hypertension.  Stable on Coreg, Hydralazine and Imdur along with diuretic as tolerated. ? ?5.  Dyslipidemia.  On statin continue. ? ?6.  BPH.  On Flomax. ? ?7.  Weakness with multiple falls at home.  PT OT. ? ?8.  Lower extremity  edema - Leg [place] -ve, resolved. ? ? ?   ? ?Condition - Fair ? ?Family Communication  :  None ? ?Code Status :  Full ? ?Consults  :  None ? ?PUD Prophylaxis :  ? ? Procedures  :    ? ?Leg [place] - No DVT. ? ?CT head - Non Acute ? ?   ? ?Disposition Plan  :   ? ?Status is: Observation ? ?DVT Prophylaxis  :  Heparin ? ? ?Lab Results  ?Component Value Date  ? PLT 260 03/13/2022  ? ? ?Diet :  ?Diet Order   ? ?       ?  Diet Heart Room service appropriate? Yes; Fluid consistency: Thin  Diet effective now       ?  ? ?  ?  ? ?  ?  ? ?Inpatient Medications ? ?Scheduled Meds: ? carvedilol  12.5 mg Oral BID WC  ? [START ON 03/14/2022] furosemide  80 mg Oral BID  ? gabapentin  600 mg Oral QHS  ? heparin injection (subcutaneous)  5,000 Units Subcutaneous Q8H  ? hydrALAZINE  50 mg Oral TID  ? insulin aspart  0-9 Units Subcutaneous TID WC  ? isosorbide mononitrate  30 mg Oral Daily  ? rosuvastatin  40 mg Oral QHS  ? sodium chloride flush  3 mL Intravenous Q12H  ? tamsulosin  0.4 mg Oral QPC supper  ? ?Continuous Infusions: ? sodium chloride    ? lactated ringers 100 mL/hr at 03/13/22 0851  ? ?PRN Meds:.sodium chloride, acetaminophen **OR** acetaminophen, albuterol, dextrose, HYDROcodone-acetaminophen, sodium chloride flush ? ?Antibiotics  :   ? ?Anti-infectives (From admission, onward)  ? ? None  ? ?  ? ? ? Time Spent in minutes  30 ? ? ?Lala Lund M.D on 03/13/2022 at 10:18 AM ? ?To page go to [email or web address]  ? ?[facility] -  Office  [phone] ? ?See all Orders from today for further details ? ? ? Objective:  ? ?Vitals:  ? 03/13/22 0001 03/13/22 0242 03/13/22 0355 03/13/22 0817  ?BP: (!) 178/104 (!) 165/97 (!) 148/80 (!) 168/94  ?Pulse: 80 82 79 77  ?Resp: 20  20 18    ?Temp: 98 ?F (36.7 ?C)  98.1 ?F (36.7 ?C) 98.5 ?F (36.9 ?C)  ?TempSrc: Oral  Oral Oral  ?SpO2: 98%  95% 95%  ?Weight:      ?Height:      ? ? ?Wt Readings from Last 3 Encounters:  ?03/10/22 97.3 kg  ?03/09/22 100.8 kg  ?11/26/21 97.5 kg  ? ? ? ?Intake/Output Summary (Last 24 hours) at 03/13/2022 1018 ?Last data filed at 03/13/2022 0500 ?Gross per 24 hour  ?Intake --  ?Output 600 ml  ?Net -600 ml  ? ? ? ?Physical Exam ? ?Awake Alert, No new F.N deficits, Normal affect ?[place].AT,PERRAL ?Supple Neck, No JVD,   ?Symmetrical Chest wall movement, Good air movement bilaterally, CTAB ?RRR,No Gallops, Rubs or new Murmurs,  ?+ve B.Sounds, Abd Soft, No tenderness,   ?No Cyanosis, Clubbing or edema  ?   ? ? Data Review:  ? ? ?CBC ?Recent Labs  ?Lab 03/10/22 ?1500 03/11/22 ?0109 03/12/22 ?3235 03/13/22 ?0052  ?WBC 7.7 7.2 6.3 5.2  ?HGB 11.6* 10.8* 10.3* 10.1*  ?HCT 39.4 35.0* 33.4* 32.8*  ?PLT 281 282 295 260  ?MCV 85.3 81.6 81.3 81.6  ?MCH 25.1* 25.2* 25.1* 25.1*  ?MCHC 29.4* 30.9 30.8 30.8  ?RDW 15.2 15.3 15.3 15.2  ?LYMPHSABS 1.0 1.5 1.8 1.6  ?MONOABS  0.4 0.5 0.5 0.5  ?EOSABS 0.0 0.1 0.1 0.1  ?BASOSABS 0.0 0.0 0.0 0.0  ? ? ?Electrolytes ?Recent Labs  ?Lab 03/09/22 ?1639 03/10/22 ?1500 03/10/22 ?2153 03/11/22 ?1443 03/12/22 ?1540 03/13/22 ?0052  ?NA 144 138  --  137 138 138  ?K 4.4 4.2  --  3.9 4.1 4.3  ?CL 108* 108  --  106 105 106  ?CO2 21 21*  --  21* 26 24  ?GLUCOSE 125* 135*  --  89 203* 208*  ?BUN 28* 24*  --  27* 33* 34*  ?CREATININE 2.32* 2.34*  --  2.42* 2.84* 2.77*  ?CALCIUM 9.2 8.9  --  9.0 9.0 8.8*  ?AST  --  27  --  21 16 15   ?ALT  --  17  --  16 16 15   ?ALKPHOS  --  66  --  61 64 61  ?BILITOT  --  0.7  --  0.5 <0.1* 0.1*  ?ALBUMIN  --  3.0*  --  2.9* 3.0* 2.9*  ?MG  --   --  1.8 1.6* 2.3 2.1  ?TSH  --   --  0.817 0.831  --   --   ?HGBA1C  --   --   --  9.7*  --   --   ? ? ?------------------------------------------------------------------------------------------------------------------ ?No results for input(s): CHOL,  HDL, LDLCALC, TRIG, CHOLHDL, LDLDIRECT in the last 72 hours. ? ?Lab Results  ?Component Value Date  ? HGBA1C 9.7 (H) 03/11/2022  ? ? ?Recent Labs  ?  03/11/22 ?0867  ?TSH 0.831  ? ?------------------------------------------------------------------------------------------------------------------ ?ID Labs ?Recent Labs  ?Lab 03/09/22 ?1639 03/10/22 ?1500 03/11/22 ?6195 03/12/22 ?0932 03/13/22 ?0052  ?WBC  --  7.7 7.2 6.3 5.2  ?PLT  --  281 282 295 260  ?CREATININE 2.32* 2.34* 2.42* 2.84* 2.77*  ? ?Cardiac Enzymes ?No results for input(s): CKMB, TROPONINI, MYOGLOBIN in the last 168 hours. ? ?Invalid input(s): CK ? ? ?Radiology Reports ?CT HEAD WO CONTRAST (5MM) ? ?Result Date: 03/10/2022 ?CLINICAL DATA:  Delirium.  Found unresponsive. EXAM: CT HEAD WITHOUT CONTRAST TECHNIQUE: Contiguous axial images were obtained from the base of the skull through the vertex without intravenous contrast. RADIATION DOSE REDUCTION: This exam was performed according to the departmental dose-optimization program which includes automated exposure control, adjustment of the mA and/or kV according to patient size and/or use of iterative reconstruction technique. COMPARISON:  06/30/2021 FINDINGS: Brain: There is atrophy and chronic small vessel disease changes. Bilateral basal ganglia lacunar infarcts, chronic. No acute intracranial abnormality. Specifically, no hemorrhage, hydrocephalus, mass lesion, acute infarction, or significant intracranial injury. Vascular: No hyperdense vessel or unexpected calcification. Skull: No acute calvarial abnormality. Sinuses/Orbits: No acute findings Other: None IMPRESSION: Atrophy, chronic microvascular disease. No acute intracranial abnormality. Electronically Signed   By: Rolm Baptise M.D.   On: 03/10/2022 20:22  ? ?DG Chest Port 1 View ? ?Result Date: 03/13/2022 ?CLINICAL DATA:  Chest pain. EXAM: PORTABLE CHEST 1 VIEW COMPARISON:  03/10/2022 FINDINGS: Cardiomegaly, LEFT ICD and generator pack overlying the  UPPER RIGHT chest again noted. Mild pulmonary vascular congestion is present. Mild bibasilar atelectasis again noted, not significantly changed. No pneumothorax or large pleural effusion identified. No other

## 2022-03-13 NOTE — Plan of Care (Signed)
?  Problem: Education: ?Goal: Knowledge of General Education information will improve ?Description: Including pain rating scale, medication(s)/side effects and non-pharmacologic comfort measures ?Outcome: Progressing ?  ?Problem: Health Behavior/Discharge Planning: ?Goal: Ability to manage health-related needs will improve ?Outcome: Progressing ?  ?Problem: Safety: ?Goal: Ability to remain free from injury will improve ?Outcome: Progressing ?  ?Problem: Education: ?Goal: Knowledge of General Education information will improve ?Description: Including pain rating scale, medication(s)/side effects and non-pharmacologic comfort measures ?Outcome: Progressing ?  ?Problem: Health Behavior/Discharge Planning: ?Goal: Ability to manage health-related needs will improve ?Outcome: Progressing ?  ?Problem: Clinical Measurements: ?Goal: Ability to maintain clinical measurements within normal limits will improve ?Outcome: Progressing ?  ?Problem: Clinical Measurements: ?Goal: Will remain free from infection ?Outcome: Progressing ?  ?Problem: Safety: ?Goal: Ability to remain free from injury will improve ?Outcome: Progressing ?  ?

## 2022-03-14 DIAGNOSIS — E162 Hypoglycemia, unspecified: Secondary | ICD-10-CM | POA: Diagnosis not present

## 2022-03-14 LAB — CBC WITH DIFFERENTIAL/PLATELET
Abs Immature Granulocytes: 0.05 10*3/uL (ref 0.00–0.07)
Basophils Absolute: 0 10*3/uL (ref 0.0–0.1)
Basophils Relative: 1 %
Eosinophils Absolute: 0.1 10*3/uL (ref 0.0–0.5)
Eosinophils Relative: 2 %
HCT: 32.9 % — ABNORMAL LOW (ref 39.0–52.0)
Hemoglobin: 10.1 g/dL — ABNORMAL LOW (ref 13.0–17.0)
Immature Granulocytes: 1 %
Lymphocytes Relative: 30 %
Lymphs Abs: 1.6 10*3/uL (ref 0.7–4.0)
MCH: 25.1 pg — ABNORMAL LOW (ref 26.0–34.0)
MCHC: 30.7 g/dL (ref 30.0–36.0)
MCV: 81.8 fL (ref 80.0–100.0)
Monocytes Absolute: 0.4 10*3/uL (ref 0.1–1.0)
Monocytes Relative: 8 %
Neutro Abs: 3.2 10*3/uL (ref 1.7–7.7)
Neutrophils Relative %: 58 %
Platelets: 257 10*3/uL (ref 150–400)
RBC: 4.02 MIL/uL — ABNORMAL LOW (ref 4.22–5.81)
RDW: 15.1 % (ref 11.5–15.5)
WBC: 5.3 10*3/uL (ref 4.0–10.5)
nRBC: 0 % (ref 0.0–0.2)

## 2022-03-14 LAB — COMPREHENSIVE METABOLIC PANEL
ALT: 15 U/L (ref 0–44)
AST: 15 U/L (ref 15–41)
Albumin: 3 g/dL — ABNORMAL LOW (ref 3.5–5.0)
Alkaline Phosphatase: 63 U/L (ref 38–126)
Anion gap: 6 (ref 5–15)
BUN: 35 mg/dL — ABNORMAL HIGH (ref 6–20)
CO2: 25 mmol/L (ref 22–32)
Calcium: 8.8 mg/dL — ABNORMAL LOW (ref 8.9–10.3)
Chloride: 108 mmol/L (ref 98–111)
Creatinine, Ser: 2.76 mg/dL — ABNORMAL HIGH (ref 0.61–1.24)
GFR, Estimated: 26 mL/min — ABNORMAL LOW (ref >60)
Glucose, Bld: 216 mg/dL — ABNORMAL HIGH (ref 70–99)
Potassium: 4.4 mmol/L (ref 3.5–5.1)
Sodium: 139 mmol/L (ref 135–145)
Total Bilirubin: 0.2 mg/dL — ABNORMAL LOW (ref 0.3–1.2)
Total Protein: 6.6 g/dL (ref 6.5–8.1)

## 2022-03-14 LAB — GLUCOSE, CAPILLARY
Glucose-Capillary: 182 mg/dL — ABNORMAL HIGH (ref 70–99)
Glucose-Capillary: 141 mg/dL — ABNORMAL HIGH (ref 70–99)
Glucose-Capillary: 175 mg/dL — ABNORMAL HIGH (ref 70–99)
Glucose-Capillary: 224 mg/dL — ABNORMAL HIGH (ref 70–99)

## 2022-03-14 LAB — BRAIN NATRIURETIC PEPTIDE: B Natriuretic Peptide: 30.8 pg/mL (ref 0.0–100.0)

## 2022-03-14 LAB — MAGNESIUM: Magnesium: 2.1 mg/dL (ref 1.7–2.4)

## 2022-03-14 MED ORDER — FUROSEMIDE 40 MG PO TABS: 80.0000 mg | ORAL_TABLET | Freq: Two times a day (BID) | ORAL | Status: DC

## 2022-03-14 MED ORDER — LACTATED RINGERS IV SOLN
INTRAVENOUS | Status: DC
Start: 2022-03-14 — End: 2022-03-14

## 2022-03-14 MED ORDER — INSULIN GLARGINE-YFGN 100 UNIT/ML ~~LOC~~ SOLN
8.0000 [IU] | Freq: Every day | SUBCUTANEOUS | Status: DC
  Administered 2022-03-14: 8 [IU] via SUBCUTANEOUS
  Filled 2022-03-14 (×2): qty 0.08

## 2022-03-14 MED ORDER — FUROSEMIDE 10 MG/ML IJ SOLN
80.0000 mg | Freq: Once | INTRAMUSCULAR | Status: AC
Start: 2022-03-14 — End: 2022-03-14
  Administered 2022-03-14: 80 mg via INTRAVENOUS
  Filled 2022-03-14: qty 8

## 2022-03-14 MED ORDER — HYDRALAZINE HCL 50 MG PO TABS
100.0000 mg | ORAL_TABLET | Freq: Three times a day (TID) | ORAL | Status: DC
  Administered 2022-03-14 – 2022-03-17 (×10): 100 mg via ORAL
  Filled 2022-03-14 (×11): qty 2

## 2022-03-14 MED ORDER — ASPIRIN 81 MG PO CHEW
81.0000 mg | CHEWABLE_TABLET | Freq: Every day | ORAL | Status: DC
  Administered 2022-03-14 – 2022-03-17 (×4): 81 mg via ORAL
  Filled 2022-03-14 (×4): qty 1

## 2022-03-14 NOTE — Progress Notes (Signed)
?                                  PROGRESS NOTE                                             ?                                                                                                                     ?                                         ? ? Patient Demographics:  ? ? Jeffrey [name], is a 56 y.o. male, DOB - 06/14/1966, YHC:[id number] ? ?Outpatient Primary MD for the patient is Charlott Rakes, MD    LOS - 1  Admit date - 03/10/2022   ? ?Chief Complaint  ?Patient presents with  ? Hypoglycemia  ?    ? ?Brief Narrative (HPI from H&P)   56 y.o. male with medical history significant of  non ischemic cardiomyopathy, sp AiCD, non-obstructive CAD, CKD, DM2 on insulin, HTN, HLD, CVA presented to the hospital with weakness, confusion found to be hypoglycemic and admitted for further care. ? ? Subjective:  ? ?Patient in bed, appears comfortable, denies any headache, no fever, no chest pain or pressure, no shortness of breath , no abdominal pain. No new focal weakness. ? ? Assessment  & Plan :  ? ?Weakness, metabolic encephalopathy all stemming from hypoglycemia caused by insulin and oral intake mismatch .  His insulin has been held, his A1c actually suggests poor outpatient control due to hyperglycemia, he is extremely noncompliant with his Accu-Cheks and diet according to the wife, counseled, currently on sliding scale low-dose Lantus added on 03/14/2022 and monitor.  Diabetic and insulin education has been provided. ? ?Lab Results  ?Component Value Date  ? HGBA1C 9.7 (H) 03/11/2022  ? ? ?CBG (last 3)  ?Recent Labs  ?  03/13/22 ?1639 03/13/22 ?2136 03/14/22 ?0733  ?GLUCAP 191* 209* 182*  ? ? ?2.  AKI on CKD 3B.  Baseline creatinine around 2.2.  Has been adequately hydrated, Delene Loll has been held, he is now developing some edema, urine sodium greater than 80, trial of IV Lasix on 03/14/2022.  UA suggest nephrotic range proteinuria, will require outpatient nephrology follow-up.  Also  check renal ultrasound. ?  ?3.  Chronic systolic heart failure.  Last EF 35% last year has AICD.  Hold Entresto and Lasix dose due to AKI on 03/12/2022, continue Coreg and Imdur. ? ?4.  Hypertension.  Stable on Coreg, Hydralazine and Imdur along with diuretic as tolerated. ? ?5.  Dyslipidemia.  On statin continue. ? ?6.  BPH.  On Flomax. ? ?7.  Weakness with multiple falls at home.  PT OT. ? ?8.  Lower extremity edema - Leg [place] -ve, Lasix, TEDs. ? ? ?   ? ?Condition - Fair ? ?Family Communication  :  None ? ?Code Status :  Full ? ?Consults  :  None ? ?PUD Prophylaxis :  ? ? Procedures  :    ? ?Leg [place] - No DVT. ? ?CT head - Non Acute ? ?   ? ?Disposition Plan  :   ? ?Status is: Observation ? ?DVT Prophylaxis  :  Heparin ? ? ?Lab Results  ?Component Value Date  ? PLT 257 03/14/2022  ? ? ?Diet :  ?Diet Order   ? ?       ?  Diet Heart Room service appropriate? Yes; Fluid consistency: Thin  Diet effective now       ?  ? ?  ?  ? ?  ?  ? ?Inpatient Medications ? ?Scheduled Meds: ? carvedilol  12.5 mg Oral BID WC  ? furosemide  80 mg Oral BID  ? gabapentin  600 mg Oral QHS  ? heparin injection (subcutaneous)  5,000 Units Subcutaneous Q8H  ? hydrALAZINE  100 mg Oral TID  ? insulin aspart  0-9 Units Subcutaneous TID WC  ? insulin glargine-yfgn  8 Units Subcutaneous Daily  ? isosorbide mononitrate  30 mg Oral Daily  ? rosuvastatin  40 mg Oral QHS  ? sodium chloride flush  3 mL Intravenous Q12H  ? tamsulosin  0.4 mg Oral QPC supper  ? ?Continuous Infusions: ? sodium chloride    ? ?PRN Meds:.sodium chloride, acetaminophen **OR** acetaminophen, albuterol, dextrose, HYDROcodone-acetaminophen, sodium chloride flush ? ?Antibiotics  :   ? ?Anti-infectives (From admission, onward)  ? ? None  ? ?  ? ? ? Time Spent in minutes  30 ? ? ?Lala Lund M.D on 03/14/2022 at 8:40 AM ? ?To page go to [email or web address]  ? ?[facility] -  Office  [phone] ? ?See all Orders from today for further details ? ? ? Objective:  ? ?Vitals:  ?  03/14/22 0100 03/14/22 0332 03/14/22 0400 03/14/22 0731  ?BP:  (!) 136/92  (!) 168/100  ?Pulse:  88  82  ?Resp: 17 18 17 17   ?Temp:  98.1 ?F (36.7 ?C)  98.4 ?F (36.9 ?C)  ?TempSrc:  Oral  Oral  ?SpO2: 96% 95% 94% 100%  ?Weight:      ?Height:      ? ? ?Wt Readings from Last 3 Encounters:  ?03/10/22 97.3 kg  ?03/09/22 100.8 kg  ?11/26/21 97.5 kg  ? ? ? ?Intake/Output Summary (Last 24 hours) at 03/14/2022 0840 ?Last data filed at 03/14/2022 2774 ?Gross per 24 hour  ?Intake 717.17 ml  ?Output 500 ml  ?Net 217.17 ml  ? ? ? ?Physical Exam ? ?Awake Alert, No new F.N deficits, Normal affect ?[place].AT,PERRAL ?Supple Neck, No JVD,   ?Symmetrical Chest wall movement, Good air movement bilaterally, CTAB ?RRR,No Gallops, Rubs or new Murmurs,  ?+ve B.Sounds, Abd Soft, No tenderness,   ?No Cyanosis, trace edema ? ?   ? ? Data Review:  ? ? ?CBC ?Recent Labs  ?Lab 03/10/22 ?1500 03/11/22 ?1287 03/12/22 ?8676 03/13/22 ?7209 03/14/22 ?0143  ?WBC 7.7 7.2 6.3 5.2 5.3  ?HGB 11.6* 10.8* 10.3* 10.1* 10.1*  ?HCT 39.4 35.0* 33.4* 32.8* 32.9*  ?PLT 281 282 295 260 257  ?MCV 85.3 81.6 81.3 81.6 81.8  ?MCH 25.1* 25.2* 25.1* 25.1* 25.1*  ?  MCHC 29.4* 30.9 30.8 30.8 30.7  ?RDW 15.2 15.3 15.3 15.2 15.1  ?LYMPHSABS 1.0 1.5 1.8 1.6 1.6  ?MONOABS 0.4 0.5 0.5 0.5 0.4  ?EOSABS 0.0 0.1 0.1 0.1 0.1  ?BASOSABS 0.0 0.0 0.0 0.0 0.0  ? ? ?Electrolytes ?Recent Labs  ?Lab 03/10/22 ?1500 03/10/22 ?2153 03/11/22 ?5009 03/12/22 ?3818 03/13/22 ?2993 03/14/22 ?0143  ?NA 138  --  137 138 138 139  ?K 4.2  --  3.9 4.1 4.3 4.4  ?CL 108  --  106 105 106 108  ?CO2 21*  --  21* 26 24 25   ?GLUCOSE 135*  --  89 203* 208* 216*  ?BUN 24*  --  27* 33* 34* 35*  ?CREATININE 2.34*  --  2.42* 2.84* 2.77* 2.76*  ?CALCIUM 8.9  --  9.0 9.0 8.8* 8.8*  ?AST 27  --  21 16 15 15   ?ALT 17  --  16 16 15 15   ?ALKPHOS 66  --  61 64 61 63  ?BILITOT 0.7  --  0.5 <0.1* 0.1* 0.2*  ?ALBUMIN 3.0*  --  2.9* 3.0* 2.9* 3.0*  ?MG  --  1.8 1.6* 2.3 2.1 2.1  ?TSH  --  0.817 0.831  --   --   --   ?HGBA1C  --    --  9.7*  --   --   --   ? ? ?------------------------------------------------------------------------------------------------------------------ ?No results for input(s): CHOL, HDL, LDLCALC, TRIG, CHOLHDL, LDLDIRECT in the last 72 hours. ? ?Lab Results  ?Component Value Date  ? HGBA1C 9.7 (H) 03/11/2022  ? ? ?No results for input(s): TSH, T4TOTAL, T3FREE, THYROIDAB in the last 72 hours. ? ?Invalid input(s): FREET3 ? ?------------------------------------------------------------------------------------------------------------------ ?ID Labs ?Recent Labs  ?Lab 03/10/22 ?1500 03/11/22 ?7169 03/12/22 ?6789 03/13/22 ?3810 03/14/22 ?0143  ?WBC 7.7 7.2 6.3 5.2 5.3  ?PLT 281 282 295 260 257  ?CREATININE 2.34* 2.42* 2.84* 2.77* 2.76*  ? ?Cardiac Enzymes ?No results for input(s): CKMB, TROPONINI, MYOGLOBIN in the last 168 hours. ? ?Invalid input(s): CK ? ? ?Radiology Reports ?CT HEAD WO CONTRAST (5MM) ? ?Result Date: 03/10/2022 ?CLINICAL DATA:  Delirium.  Found unresponsive. EXAM: CT HEAD WITHOUT CONTRAST TECHNIQUE: Contiguous axial images were obtained from the base of the skull through the vertex without intravenous contrast. RADIATION DOSE REDUCTION: This exam was performed according to the departmental dose-optimization program which includes automated exposure control, adjustment of the mA and/or kV according to patient size and/or use of iterative reconstruction technique. COMPARISON:  06/30/2021 FINDINGS: Brain: There is atrophy and chronic small vessel disease changes. Bilateral basal ganglia lacunar infarcts, chronic. No acute intracranial abnormality. Specifically, no hemorrhage, hydrocephalus, mass lesion, acute infarction, or significant intracranial injury. Vascular: No hyperdense vessel or unexpected calcification. Skull: No acute calvarial abnormality. Sinuses/Orbits: No acute findings Other: None IMPRESSION: Atrophy, chronic microvascular disease. No acute intracranial abnormality. Electronically Signed   By:  Rolm Baptise M.D.   On: 03/10/2022 20:22  ? ?DG Chest Port 1 View ? ?Result Date: 03/13/2022 ?CLINICAL DATA:  Chest pain. EXAM: PORTABLE CHEST 1 VIEW COMPARISON:  03/10/2022 FINDINGS: Cardiomegaly, LEFT ICD and g

## 2022-03-14 NOTE — Discharge Instructions (Addendum)
Accuchecks 4 times/day, Once in AM empty stomach and then before each meal. ?Log in all results and show them to your Prim.MD in 3 days. ?If any glucose reading is under 80 or above 300 call your Prim MD immidiately. ?Follow Low glucose instructions for glucose under 80 as instructed. ? ?Follow with Primary MD Charlott Rakes, MD along with your cardiologist and recommended nephrologist in 7 days  ? ?Get CBC, CMP, 2 view Chest X ray -  checked next visit within 1 week by Primary MD   ? ?Activity: As tolerated with Full fall precautions use walker/cane & assistance as needed ? ?Disposition Home   ? ?Diet: Heart Healthy low carbohydrate diet with 1.5 L fluid restriction per day. ? ?Check your Weight same time everyday, if you gain over 2 pounds, or you develop in leg swelling, experience more shortness of breath or chest pain, call your Primary MD immediately. Follow Cardiac Low Salt Diet and 1.5 lit/day fluid restriction. ? ?Special Instructions: If you have smoked or chewed Tobacco  in the last 2 yrs please stop smoking, stop any regular Alcohol  and or any Recreational drug use. ? ?On your next visit with your primary care physician please Get Medicines reviewed and adjusted. ? ?Please request your Prim.MD to go over all Hospital Tests and Procedure/Radiological results at the follow up, please get all Hospital records sent to your Prim MD by signing hospital release before you go home. ? ?If you experience worsening of your admission symptoms, develop shortness of breath, life threatening emergency, suicidal or homicidal thoughts you must seek medical attention immediately by calling 911 or calling your MD immediately  if symptoms less severe. ? ?You Must read complete instructions/literature along with all the possible adverse reactions/side effects for all the Medicines you take and that have been prescribed to you. Take any new Medicines after you have completely understood and accpet all the possible adverse  reactions/side effects.  ? ?  ?

## 2022-03-14 NOTE — TOC Initial Note (Signed)
Transition of Care (TOC) - Initial/Assessment Note  ? ? ?Patient Details  ?Name: Jeffrey [name] ?MRN: [id number] ?Date of Birth: Apr 22, 1966 ? ?Transition of Care (TOC) CM/SW Contact:    ?Cyndi Bender, RN ?Phone Number: ?03/14/2022, 1:13 PM ? ?Clinical Narrative:                 ?Spoke to patient regarding transition needs. ?Patient states he lives with his girlfriend who helps him. He declines HH and tub bench. ?TOC will continue to follow for needs ? ?Expected Discharge Plan: Home/Self Care ?Barriers to Discharge: Continued Medical Work up ? ? ?Patient Goals and CMS Choice ?Patient states their goals for this hospitalization and ongoing recovery are:: return home ?  ?  ? ?Expected Discharge Plan and Services ?Expected Discharge Plan: Home/Self Care ?  ?  ?  ?Living arrangements for the past 2 months: [place] ?                ?  ?  ?  ?  ?  ?  ?  ?  ?  ?  ? ?Prior Living Arrangements/Services ?Living arrangements for the past 2 months: [place] ?Lives with:: Significant Other ?Patient language and need for interpreter reviewed:: Yes ?Do you feel safe going back to the place where you live?: Yes      ?Need for Family Participation in Patient Care: Yes (Comment) ?Care giver support system in place?: Yes (comment) ?  ?Criminal Activity/Legal Involvement Pertinent to Current Situation/Hospitalization: No - Comment as needed ? ?Activities of Daily Living ?Home Assistive Devices/Equipment: Gilford Rile (specify type) ?ADL Screening (condition at time of admission) ?[name] cognitive ability adequate to safely complete daily activities?: Yes ?Is the patient deaf or have difficulty hearing?: No ?Does the patient have difficulty seeing, even when wearing glasses/contacts?: No ?Does the patient have difficulty concentrating, remembering, or making decisions?: No ?Patient able to express need for assistance with ADLs?: Yes ?Does the patient have difficulty dressing or bathing?: Yes ?Independently performs  ADLs?: No ?Communication: Independent ?Dressing (OT): Needs assistance ?Is this a change from baseline?: Change from baseline, expected to last >3 days ?Grooming: Independent ?Feeding: Independent ?Bathing: Needs assistance ?Is this a change from baseline?: Change from baseline, expected to last >3 days ?Toileting: Needs assistance ?Is this a change from baseline?: Change from baseline, expected to last >3days ?In/Out Bed: Needs assistance ?Is this a change from baseline?: Change from baseline, expected to last >3 days ?Walks in Home: Needs assistance ?Is this a change from baseline?: Change from baseline, expected to last >3 days ?Does the patient have difficulty walking or climbing stairs?: Yes ?Weakness of Legs: Both ?Weakness of Arms/Hands: None ? ?Permission Sought/Granted ?  ?  ?   ?   ?   ?   ? ?Emotional Assessment ?Appearance:: Appears stated age ?Attitude/Demeanor/Rapport: Engaged ?Affect (typically observed): Accepting ?Orientation: : Oriented to Self, Oriented to Place, Oriented to  Time, Oriented to Situation ?Alcohol / Substance Use: Not Applicable ?Psych Involvement: No (comment) ? ?Admission diagnosis:  Hypoglycemia [E16.2] ?Acute renal failure ([place]) [N17.9] ?Patient Active Problem List  ? Diagnosis Date Noted  ? Acute renal failure ([place]) 03/13/2022  ? Leg edema, left 03/10/2022  ? Falls 03/10/2022  ? Syncope 03/10/2022  ? Elevated troponin 07/01/2021  ? Seizure ([place]) 07/01/2021  ? Acute stroke due to ischemia [facility])   ? Hyperglycemia 06/30/2021  ? Prolonged QT interval 06/30/2021  ? Acute on chronic renal failure ([place]) 06/30/2021  ? Hypokalemia 06/30/2021  ?  Hyperosmolar hyperglycemic state (HHS) ([place]) 06/30/2021  ? Chronic systolic CHF (congestive heart failure) ([place]) 06/30/2021  ? Essential hypertension 06/30/2021  ? Uncontrolled type 2 DM with hyperosmolar nonketotic hyperglycemia ([place]) 06/30/2021  ? Anemia due to chronic kidney disease 06/30/2021  ? CKD (chronic kidney disease), stage III ([place])  06/30/2021  ? CAD (coronary artery disease) 06/30/2021  ? Fall due to seizure [facility]) 06/30/2021  ? Nephrotic syndrome   ? Hyperglycemia due to diabetes mellitus ([place]) 06/12/2020  ? Hypoglycemia 10/16/2018  ? Acute metabolic encephalopathy 74/11/8785  ? S/P ICD (internal cardiac defibrillator) procedure 01/19/2018  ? Cardiac device in situ   ? Syncope and collapse 01/13/2018  ? NICM (nonischemic cardiomyopathy) ([place]) 02/15/2017  ? Coronary artery disease involving native coronary artery of native heart without angina pectoris 02/15/2017  ? Hyperlipidemia 02/15/2017  ? Chronic combined systolic (congestive) and diastolic (congestive) heart failure ([place]) 01/28/2017  ? Folliculitis 76/72/0947  ? 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 Anemia, iron deficiency   ? Noncompliance with medication regimen 03/30/2016  ? Uncontrolled type 2 diabetes mellitus with hyperglycemia, with long-term current use of insulin ([place])   ? Cellulitis 09/25/2015  ? Essential hypertension 08/21/2013  ? Diabetic neuropathy ([place]) 08/21/2013  ? Fungal toenail infection 08/21/2013  ? ?PCP:  Charlott Rakes, MD ?Pharmacy:   ?[facility] [address], [address] ?[place] [address] ?Phone: [phone] Fax: [phone] ? ?[facility] ?[address] ?[place] [address] ?Phone: [phone] Fax: [phone] ? ? ? ? ?Social Determinants of Health (SDOH) Interventions ?  ? ?Readmission Risk Interventions ?   ? View : No data to display.  ?  ?  ?  ? ? ? ?

## 2022-03-15 ENCOUNTER — Other Ambulatory Visit: Payer: Self-pay

## 2022-03-15 ENCOUNTER — Inpatient Hospital Stay (HOSPITAL_COMMUNITY): Payer: Medicare Other

## 2022-03-15 DIAGNOSIS — E162 Hypoglycemia, unspecified: Secondary | ICD-10-CM | POA: Diagnosis not present

## 2022-03-15 LAB — COMPREHENSIVE METABOLIC PANEL
ALT: 17 U/L (ref 0–44)
AST: 17 U/L (ref 15–41)
Albumin: 3 g/dL — ABNORMAL LOW (ref 3.5–5.0)
Alkaline Phosphatase: 59 U/L (ref 38–126)
Anion gap: 8 (ref 5–15)
BUN: 48 mg/dL — ABNORMAL HIGH (ref 6–20)
CO2: 24 mmol/L (ref 22–32)
Calcium: 8.9 mg/dL (ref 8.9–10.3)
Chloride: 106 mmol/L (ref 98–111)
Creatinine, Ser: 3.06 mg/dL — ABNORMAL HIGH (ref 0.61–1.24)
GFR, Estimated: 23 mL/min — ABNORMAL LOW (ref >60)
Glucose, Bld: 216 mg/dL — ABNORMAL HIGH (ref 70–99)
Potassium: 4.1 mmol/L (ref 3.5–5.1)
Sodium: 138 mmol/L (ref 135–145)
Total Bilirubin: 0.5 mg/dL (ref 0.3–1.2)
Total Protein: 6.7 g/dL (ref 6.5–8.1)

## 2022-03-15 LAB — CBC WITH DIFFERENTIAL/PLATELET
Abs Immature Granulocytes: 0.04 10*3/uL (ref 0.00–0.07)
Basophils Absolute: 0 10*3/uL (ref 0.0–0.1)
Basophils Relative: 1 %
Eosinophils Absolute: 0.1 10*3/uL (ref 0.0–0.5)
Eosinophils Relative: 1 %
HCT: 32.5 % — ABNORMAL LOW (ref 39.0–52.0)
Hemoglobin: 10.2 g/dL — ABNORMAL LOW (ref 13.0–17.0)
Immature Granulocytes: 1 %
Lymphocytes Relative: 27 %
Lymphs Abs: 1.6 10*3/uL (ref 0.7–4.0)
MCH: 25.6 pg — ABNORMAL LOW (ref 26.0–34.0)
MCHC: 31.4 g/dL (ref 30.0–36.0)
MCV: 81.5 fL (ref 80.0–100.0)
Monocytes Absolute: 0.5 10*3/uL (ref 0.1–1.0)
Monocytes Relative: 8 %
Neutro Abs: 3.7 10*3/uL (ref 1.7–7.7)
Neutrophils Relative %: 62 %
Platelets: 250 10*3/uL (ref 150–400)
RBC: 3.99 MIL/uL — ABNORMAL LOW (ref 4.22–5.81)
RDW: 15 % (ref 11.5–15.5)
WBC: 6 10*3/uL (ref 4.0–10.5)
nRBC: 0 % (ref 0.0–0.2)

## 2022-03-15 LAB — GLUCOSE, CAPILLARY
Glucose-Capillary: 227 mg/dL — ABNORMAL HIGH (ref 70–99)
Glucose-Capillary: 215 mg/dL — ABNORMAL HIGH (ref 70–99)
Glucose-Capillary: 165 mg/dL — ABNORMAL HIGH (ref 70–99)
Glucose-Capillary: 246 mg/dL — ABNORMAL HIGH (ref 70–99)

## 2022-03-15 LAB — MAGNESIUM: Magnesium: 2 mg/dL (ref 1.7–2.4)

## 2022-03-15 LAB — BRAIN NATRIURETIC PEPTIDE: B Natriuretic Peptide: 32.8 pg/mL (ref 0.0–100.0)

## 2022-03-15 MED ORDER — INSULIN GLARGINE-YFGN 100 UNIT/ML ~~LOC~~ SOLN
14.0000 [IU] | Freq: Every day | SUBCUTANEOUS | Status: DC
  Administered 2022-03-15 – 2022-03-16 (×2): 14 [IU] via SUBCUTANEOUS
  Filled 2022-03-15 (×3): qty 0.14

## 2022-03-15 MED ORDER — FUROSEMIDE 10 MG/ML IJ SOLN
80.0000 mg | Freq: Two times a day (BID) | INTRAMUSCULAR | Status: DC
  Administered 2022-03-15 – 2022-03-16 (×2): 80 mg via INTRAVENOUS
  Filled 2022-03-15 (×2): qty 8

## 2022-03-15 MED ORDER — INSULIN ASPART 100 UNIT/ML IJ SOLN
0.0000 [IU] | Freq: Every day | INTRAMUSCULAR | Status: DC
  Administered 2022-03-15: 2 [IU] via SUBCUTANEOUS

## 2022-03-15 MED ORDER — INSULIN ASPART 100 UNIT/ML IJ SOLN
0.0000 [IU] | Freq: Three times a day (TID) | INTRAMUSCULAR | Status: DC
  Administered 2022-03-15: 2 [IU] via SUBCUTANEOUS
  Administered 2022-03-15: 3 [IU] via SUBCUTANEOUS
  Administered 2022-03-16 – 2022-03-17 (×2): 2 [IU] via SUBCUTANEOUS
  Administered 2022-03-17: 3 [IU] via SUBCUTANEOUS

## 2022-03-15 MED ORDER — ISOSORBIDE MONONITRATE ER 60 MG PO TB24
60.0000 mg | ORAL_TABLET | Freq: Every day | ORAL | Status: DC
  Administered 2022-03-15 – 2022-03-17 (×3): 60 mg via ORAL
  Filled 2022-03-15 (×3): qty 1

## 2022-03-15 NOTE — Consult Note (Signed)
[facility]  ?HISTORY AND PHYSICAL ? ?Jeffrey [name] is an 56 y.o. male.   ? ?Chief Complaint: hypoglycemia ? ?HPI: Pt is a 27M with a PMH sig for HTN, HLD, DM II, h/o diabetic ulcers, systolic CHF with EF 32-99%, h/o CVA, and CKD 3b who is now seen in consultation at the request of Dr. Candiss Norse for eval and recs re: Aki on CKD.   ? ?Pt was admitted 03/10/22 for hypoglycemia and weakness.  At that time baseline cr was 2.0.  Was found to have insulin and oral intake mismatch- insulin has been adjusted.  He has also been found to have volume overload and was initially started on his OP lasix.  Cr bumped up and was given fluids but Cr has now drifted upwards and is 3.0.  in this setting we are asked to see. ? ?Renal US is negative for obstruction.  UA with > 300 mg proteinuria, sending UP/C.   ? ?He is scheduled for a CT head today as his encephalopathy is not clearing as expected.  Last EF 06/2021 was 30-35%.  Has AICD in place.   ? ?Spoke to [employer] by phone today- says that his legs and abd have been swelling for about 2 months.  Prior to that weight was about 175-180.  Last OP visit weight was 216.  Followed by Dr Royce Macadamia at [facility]. ? ?Pt reports cough but isn't very interactive- intermittently falling asleep.  No NSAIDs or hypotensive episodes. ? ?PMH: ?Past Medical History:  ?Diagnosis Date  ? AICD (automatic cardioverter/defibrillator) present   ? Medtronic  ? AICD (automatic cardioverter/defibrillator) present   ? MDT Visia AF MRI  ? Anemia   ? CAD (coronary artery disease)   ? a. cath 01/31/17: 60% 1st RPLB, 60% dist RCA, 55% prox RCA, 10% pro LAD --> Rx TX.   ? CHF (congestive heart failure) ([place])   ? Chronic systolic CHF (congestive heart failure) ([place]) 01/28/2017  ? 1. Echo 01/29/17:  EF 20-25, normal wall motion, mild LAE // 2. EF 10-15 by [facility] 01/2017   ? Coronary artery disease   ? Diabetes mellitus   ? type II  ? Diabetes mellitus without complication ([place])   ? Diabetic foot infection ([place])  03/2016  ? RT FOOT  ? Dyspnea   ? History of kidney stones   ? passed  ? History of kidney stones   ? HTN (hypertension)   ? Hyperlipidemia   ? Hypertension   ? Myocardial infarction [facility]), although reported, NICM at cath   ? NICM (nonischemic cardiomyopathy) ([place]) 02/15/2017  ? 1. Mod non-obs CAD on LHC in 01/2017 - CAD does not explain cardiomyopathy  ? Renal disorder   ? ?PSH: ?Past Surgical History:  ?Procedure Laterality Date  ? AIR/FLUID EXCHANGE Right 07/14/2020  ? Procedure: AIR/FLUID EXCHANGE;  Surgeon: Jalene Mullet, MD;  Location: [place];  Service: Ophthalmology;  Laterality: Right;  ? AMPUTATION Right 04/01/2016  ? Procedure: Right Great Toe Amputation;  Surgeon: Newt Minion, MD;  Location: [place];  Service: Orthopedics;  Laterality: Right;  ? AMPUTATION Right 06/19/2016  ? Procedure: AMPUTATION SECOND TOE;  Surgeon: Marybelle Killings, MD;  Location: [place];  Service: Orthopedics;  Laterality: Right;  ? BACK SURGERY    ? for abscess  ? CARDIAC CATHETERIZATION    ? ICD IMPLANT N/A 01/15/2018  ? Procedure: ICD IMPLANT;  Surgeon: Deboraha Sprang, MD;  Location: [place] [facility];  Service: Cardiovascular;  Laterality:  N/A;  ? INJECTION OF SILICONE OIL Right 1/0/2585  ? Procedure: INJECTION OF SILICONE OIL;  Surgeon: Jalene Mullet, MD;  Location: [place];  Service: Ophthalmology;  Laterality: Right;  ? INJECTION OF SILICONE OIL Right 2/77/8242  ? Procedure: INJECTION OF SILICONE OIL;  Surgeon: Jalene Mullet, MD;  Location: [place];  Service: Ophthalmology;  Laterality: Right;  ? INSERT / REPLACE / REMOVE PACEMAKER    ? MEMBRANE PEEL Right 08/25/2020  ? Procedure: MEMBRANE PEEL;  Surgeon: Jalene Mullet, MD;  Location: [place];  Service: Ophthalmology;  Laterality: Right;  ? PARS PLANA VITRECTOMY Right 07/14/2020  ? Procedure: PARS PLANA VITRECTOMY WITH 25 GAUGE, Membranetomy, drainage of subretinal fluid;  Surgeon: Jalene Mullet, MD;  Location: [place];  Service: Ophthalmology;  Laterality: Right;  ? PARS PLANA VITRECTOMY  Right 08/25/2020  ? Procedure: PARS PLANA VITRECTOMY WITH 25 GAUGE;  Surgeon: Jalene Mullet, MD;  Location: [place];  Service: Ophthalmology;  Laterality: Right;  ? PHOTOCOAGULATION WITH LASER Right 07/14/2020  ? Procedure: PHOTOCOAGULATION WITH LASER;  Surgeon: Jalene Mullet, MD;  Location: [place];  Service: Ophthalmology;  Laterality: Right;  ? PHOTOCOAGULATION WITH LASER Right 08/25/2020  ? Procedure: PHOTOCOAGULATION WITH LASER;  Surgeon: Jalene Mullet, MD;  Location: [place];  Service: Ophthalmology;  Laterality: Right;  ? 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  ? Procedure: REPAIR OF HEMORRHAGIC DETACHMENT;  Surgeon: Jalene Mullet, MD;  Location: [place];  Service: Ophthalmology;  Laterality: Right;  ? RIGHT/LEFT HEART CATH AND CORONARY ANGIOGRAPHY N/A 01/31/2017  ? Procedure: Right/Left Heart Cath and Coronary Angiography;  Surgeon: Troy Sine, MD;  Location: [place] [facility];  Service: Cardiovascular;  Laterality: N/A;  ? SILICON OIL REMOVAL Right 08/25/2020  ? Procedure: SILICON OIL REMOVAL;  Surgeon: Jalene Mullet, MD;  Location: [place];  Service: Ophthalmology;  Laterality: Right;  ? ? ?Past Medical History:  ?Diagnosis Date  ? AICD (automatic cardioverter/defibrillator) present   ? Medtronic  ? AICD (automatic cardioverter/defibrillator) present   ? MDT Visia AF MRI  ? Anemia   ? CAD (coronary artery disease)   ? a. cath 01/31/17: 60% 1st RPLB, 60% dist RCA, 55% prox RCA, 10% pro LAD --> Rx TX.   ? CHF (congestive heart failure) ([place])   ? Chronic systolic CHF (congestive heart failure) ([place]) 01/28/2017  ? 1. Echo 01/29/17:  EF 20-25, normal wall motion, mild LAE // 2. EF 10-15 by [facility] 01/2017   ? Coronary artery disease   ? Diabetes mellitus   ? type II  ? Diabetes mellitus without complication ([place])   ? Diabetic foot infection ([place]) 03/2016  ? RT FOOT  ? Dyspnea   ? History of kidney stones   ? passed  ? History of kidney stones   ? HTN (hypertension)   ? Hyperlipidemia   ?  Hypertension   ? Myocardial infarction [facility]), although reported, NICM at cath   ? NICM (nonischemic cardiomyopathy) ([place]) 02/15/2017  ? 1. Mod non-obs CAD on LHC in 01/2017 - CAD does not explain cardiomyopathy  ? Renal disorder   ? ? ?Medications: ? Scheduled: ? aspirin  81 mg Oral Daily  ? carvedilol  12.5 mg Oral BID WC  ? furosemide  80 mg Intravenous BID  ? heparin injection (subcutaneous)  5,000 Units Subcutaneous Q8H  ? hydrALAZINE  100 mg Oral TID  ? insulin aspart  0-5 Units Subcutaneous QHS  ? insulin aspart  0-9 Units Subcutaneous TID WC  ? insulin glargine-yfgn  14 Units Subcutaneous Daily  ? isosorbide mononitrate  60 mg Oral Daily  ? rosuvastatin  40 mg Oral QHS  ? sodium chloride flush  3 mL Intravenous Q12H  ? tamsulosin  0.4 mg Oral QPC supper  ? ? ?Medications Prior to Admission  ?Medication Sig Dispense Refill  ? albuterol (VENTOLIN HFA) 108 (90 Base) MCG/ACT inhaler Inhale 1 to 2 puffs by mouth into the lungs every 6 hours as needed for wheezing or shortness of breath. 18 g 0  ? aspirin 81 MG EC tablet Take 1 tablet (81 mg total) by mouth daily. Swallow whole. 30 tablet 2  ? carvedilol (COREG) 12.5 MG tablet TAKE 1 TABLET (12.5 MG TOTAL) BY MOUTH 2 (TWO) TIMES DAILY WITH A MEAL. 60 tablet 6  ? cetirizine (ZYRTEC) 10 MG tablet TAKE 1 TABLET (10 MG TOTAL) BY MOUTH DAILY. (Patient taking differently: Take 10 mg by mouth daily as needed for allergies.) 30 tablet 0  ? ferrous sulfate 325 (65 FE) MG tablet Take 1 tablet (325 mg total) by mouth 2 (two) times daily with a meal. 60 tablet 3  ? furosemide (LASIX) 40 MG tablet Take 1 tablet (40 mg total) by mouth 2 (two) times daily. (Patient taking differently: Take 80 mg by mouth 2 (two) times daily. Take 80 mg BID X 4 Days. Then 40 mg BID) 60 tablet 6  ? gabapentin (NEURONTIN) 300 MG capsule Take 2 capsules (600 mg total) by mouth at bedtime. 60 capsule 6  ? hydrALAZINE (APRESOLINE) 25 MG tablet Take 1 tablet (25 mg total) by mouth 3 (three) times daily.  90 tablet 6  ? insulin aspart (NOVOLOG FLEXPEN) 100 UNIT/ML FlexPen Inject 0-9 Units into the skin See admin instructions. Inject 0-9 units into the skin 3 times a day with meals, PER SLIDING SCALE:  ?CBG 70 - 12

## 2022-03-15 NOTE — Progress Notes (Signed)
Occupational Therapy Treatment ?Patient Details ?Name: Jeffrey [name] ?MRN: [id number] ?DOB: 09-09-1966 ?Today's Date: 03/15/2022 ? ? ?History of present illness Pt is a 56 y.o. male with medical history significant of non ischemic cardiomyopathy, sp AiCD, non-obstructive CAD, CKD, DM2, HTN, HLD, CVA.  Presented with   episode of recurrent confusion currently due to hypoglycemia down to 40. Recurrent hypoglycemic episodes at home. ?  ?OT comments ? Pt is making steady progress with OT at this time.  He currently completes toilet transfers at supervision level with use of the RW.  Min assist is needed for simulated stepping over the edge of the tub.  Feel he will benefit from use of a shower seat at home, which his girlfriend reports that they have.  Will continued to follow for acute care OT until discharge home or when pt reaches modified independent level.    ? ?Recommendations for follow up therapy are one component of a multi-disciplinary discharge planning process, led by the attending physician.  Recommendations may be updated based on patient status, additional functional criteria and insurance authorization. ?   ?Follow Up Recommendations ? Home health OT  ?  ?Assistance Recommended at Discharge Intermittent Supervision/Assistance  ?Patient can return home with the following ? A little help with walking and/or transfers;A little help with bathing/dressing/bathroom;Direct supervision/assist for medications management;Assist for transportation ?  ?Equipment Recommendations ? Tub/shower seat  ?  ?   ?Precautions / Restrictions Precautions ?Precautions: Fall ?Restrictions ?Weight Bearing Restrictions: No  ? ? ?  ? ?Mobility Bed Mobility ?  ?  ?  ?  ?  ?  ?  ?  ?  ? ?Transfers ?Overall transfer level: Needs assistance ?Equipment used: Rolling walker (2 wheels) ?Transfers: Sit to/from Stand, Bed to chair/wheelchair/BSC ?Sit to Stand: Supervision ?Stand pivot transfers: Supervision ?  ?Step pivot transfers:  Supervision ?  ?  ?General transfer comment: Use of RW for support with sit to stand as well as functional mobility.  He was able to ambulate greater than 350 feet during session with close supervision. ?  ?  ?Balance Overall balance assessment: Needs assistance ?Sitting-balance support: No upper extremity supported, Feet supported ?Sitting balance-Leahy Scale: Good ?  ?  ?Standing balance support: During functional activity, Reliant on assistive device for balance ?Standing balance-Leahy Scale: Fair ?  ?  ?  ?  ?  ?  ?  ?  ?  ?  ?  ?  ?   ? ?ADL either performed or assessed with clinical judgement  ? ?ADL Overall ADL's : Needs assistance/impaired ?  ?  ?  ?  ?  ?  ?  ?  ?  ?  ?Lower Body Dressing: Supervision/safety ?Lower Body Dressing Details (indicate cue type and reason): Pt was able to donn slip on shoes with setup. ?Toilet Transfer: Supervision/safety;Ambulation;Rolling walker (2 wheels);Regular Toilet ?  ?  ?  ?Tub/ Shower Transfer: Minimal assistance;Ambulation ?Tub/Shower Transfer Details (indicate cue type and reason): Simulated to step over the edge of the tub. ?Functional mobility during ADLs: Supervision/safety;Rolling walker (2 wheels) ?General ADL Comments: Pt with increased difficulty stepping over the edge of the simulated tub without an UE support.  Pt is unsure if he holds onto something when stepping over, but feel he likley does.  Educated him on need for a shower seat for safety and not get all the way down into the bottom of the tub.  His girlfriend reports via phone that she has access to one he can use. ?  ? ?   ?   ?   ? ?  Cognition Arousal/Alertness: Awake/alert ?Behavior During Therapy: [facility] for tasks assessed/performed ?Overall Cognitive Status: Within Functional Limits for tasks assessed ?  ?  ?  ?  ?  ?  ?  ?  ?  ?  ?  ?  ?  ?  ?  ?  ?  ?  ?  ?   ?   ?   ?   ? ? ?Pertinent Vitals/ Pain       Pain Assessment ?Pain Assessment: No/denies pain ? ?   ?   ? ?Frequency ? Min 2X/week  ? ? ? ? ?   ?Progress Toward Goals ? ?OT Goals(current goals can now be found in the care plan section) ? Progress towards OT goals: Progressing toward goals ? ?Acute Rehab OT Goals ?OT Goal Formulation: With patient ?Time For Goal Achievement: 03/25/22 ?Potential to Achieve Goals: Good  ?Plan Discharge plan remains appropriate   ? ?   ?AM-PAC OT "6 Clicks" Daily Activity     ?Outcome Measure ? ? Help from another person eating meals?: None ?Help from another person taking care of personal grooming?: A Little ?Help from another person toileting, which includes using toliet, bedpan, or urinal?: A Little ?Help from another person bathing (including washing, rinsing, drying)?: A Little ?Help from another person to put on and taking off regular upper body clothing?: A Little ?Help from another person to put on and taking off regular lower body clothing?: A Little ?6 Click Score: 19 ? ?  ?End of Session Equipment Utilized During Treatment: Gait belt;Rolling walker (2 wheels) ? ?OT Visit Diagnosis: Unsteadiness on feet (R26.81);Repeated falls (R29.6);Muscle weakness (generalized) (M62.81);History of falling (Z91.81) ?  ?Activity Tolerance Patient tolerated treatment well ?  ?Patient Left in chair;with call bell/phone within reach;with chair alarm set ?  ?Nurse Communication Mobility status ?  ? ?   ? ?Time: 1426-1500 ?OT Time Calculation (min): 34 min ? ?Charges: OT General Charges ?$OT Visit: 1 Visit ?OT Treatments ?$Therapeutic Activity: 23-37 mins ? ?Loleta Frommelt OTR/L ?03/15/2022, 3:16 PM ?

## 2022-03-15 NOTE — Progress Notes (Addendum)
?                                  PROGRESS NOTE                                             ?                                                                                                                     ?                                         ? ? Patient Demographics:  ? ? Jeffrey [name], is a 56 y.o. male, DOB - 03-16-1966, JHE:[id number] ? ?Outpatient Primary MD for the patient is Charlott Rakes, MD    LOS - 2  Admit date - 03/10/2022   ? ?Chief Complaint  ?Patient presents with  ? Hypoglycemia  ?    ? ?Brief Narrative (HPI from H&P)   56 y.o. male with medical history significant of  non ischemic cardiomyopathy, sp AiCD, non-obstructive CAD, CKD, DM2 on insulin, HTN, HLD, CVA presented to the hospital with weakness, confusion found to be hypoglycemic and admitted for further care. ? ? Subjective:  ? ?Patient in bed, appears comfortable, denies any headache, no fever, no chest pain or pressure, no shortness of breath , no abdominal pain. No new focal weakness. ? ? Assessment  & Plan :  ? ?Weakness, metabolic encephalopathy all stemming from hypoglycemia caused by insulin and oral intake mismatch .  His insulin has been held, his A1c actually suggests poor outpatient control due to hyperglycemia, he is extremely noncompliant with his Accu-Cheks and diet according to the wife, counseled, currently on sliding scale low-dose Lantus added and dose further adjusted on 03/15/2022 continue to monitor.  Diabetic and insulin education has been provided. ? ?Lab Results  ?Component Value Date  ? HGBA1C 9.7 (H) 03/11/2022  ? ? ?CBG (last 3)  ?Recent Labs  ?  03/14/22 ?1615 03/14/22 ?2211 03/15/22 ?0755  ?GLUCAP 175* 224* 227*  ? ? ?2.  AKI on CKD 3B.  Baseline creatinine around 2.2.  He was initially hydrated his home dose Lasix was held for the first 2 days however renal function continued to decline and he sequently developed some edema, home dose Lasix was given on 03/14/2022 with  worsening of renal function, UA noted with nephrotic range proteinuria, renal ultrasound pending, nephrology consult has been requested.  Patient follows with Dr. Royce Macadamia at baseline. ?  ?3.  Chronic systolic heart failure.  Last EF 35% last year has AICD.  Hold Lasix dose due to AKI on 03/12/2022, continue Coreg, hydralazine and Imdur. ? ?4.  Hypertension.  Stable on Coreg, Hydralazine and Imdur along with diuretic as tolerated. ? ?5.  Dyslipidemia.  On statin continue. ? ?6.  BPH.  On Flomax. ? ?7.  Weakness with multiple falls at home.  PT OT. ? ?8.  Lower extremity edema - Leg [place] -ve, Lasix, TEDs. ? ?9.  Note continued encephalopathy.  Although head CT is unremarkable and currently no focal deficits, on aspirin and statin for secondary prevention however continues to be slightly confused according to the wife he recently had similar presentation and was found to have 2 remote strokes, hold Neurontin, ordered MRI which could not be done due to his new AICD device per cardiology, repeat CT ordered on 03/15/2022.  Continue to monitor. ? ? ?   ? ?Condition - Fair ? ?Family Communication  :  wife [name] [phone] 03/12/22, 03/14/22 03/15/2022 ? ?Code Status :  Full ? ?Consults  :  Nephrology ? ?PUD Prophylaxis :  ? ? Procedures  :    ? ?MRI - ? ?Leg [place] - No DVT. ? ?CT head - Non Acute ? ?   ? ?Disposition Plan  :   ? ?Status is: Observation ? ?DVT Prophylaxis  :  Heparin ? ? ?Lab Results  ?Component Value Date  ? PLT 250 03/15/2022  ? ? ?Diet :  ?Diet Order   ? ?       ?  Diet Heart Room service appropriate? Yes; Fluid consistency: Thin  Diet effective now       ?  ? ?  ?  ? ?  ?  ? ?Inpatient Medications ? ?Scheduled Meds: ? aspirin  81 mg Oral Daily  ? carvedilol  12.5 mg Oral BID WC  ? heparin injection (subcutaneous)  5,000 Units Subcutaneous Q8H  ? hydrALAZINE  100 mg Oral TID  ? insulin aspart  0-5 Units Subcutaneous QHS  ? insulin aspart  0-9 Units Subcutaneous TID WC  ? insulin glargine-yfgn  14 Units Subcutaneous  Daily  ? isosorbide mononitrate  60 mg Oral Daily  ? rosuvastatin  40 mg Oral QHS  ? sodium chloride flush  3 mL Intravenous Q12H  ? tamsulosin  0.4 mg Oral QPC supper  ? ?Continuous Infusions: ? sodium chloride    ? ?PRN Meds:.sodium chloride, acetaminophen **OR** acetaminophen, albuterol, dextrose, sodium chloride flush ? ?Antibiotics  :   ? ?Anti-infectives (From admission, onward)  ? ? None  ? ?  ? ? ? Time Spent in minutes  30 ? ? ?Lala Lund M.D on 03/15/2022 at 9:41 AM ? ?To page go to [email or web address]  ? ?[facility] -  Office  [phone] ? ?See all Orders from today for further details ? ? ? Objective:  ? ?Vitals:  ? 03/15/22 0300 03/15/22 0400 03/15/22 0500 03/15/22 0609  ?BP:    (!) 162/99  ?Pulse:    99  ?Resp: 19 18 18 18   ?Temp:    99.1 ?F (37.3 ?C)  ?TempSrc:    Oral  ?SpO2: 94% 95% 95% 98%  ?Weight:      ?Height:      ? ? ?Wt Readings from Last 3 Encounters:  ?03/10/22 97.3 kg  ?03/09/22 100.8 kg  ?11/26/21 97.5 kg  ? ? ? ?Intake/Output Summary (Last 24 hours) at 03/15/2022 0941 ?Last data filed at 03/15/2022 4854 ?Gross per 24 hour  ?Intake --  ?Output 2000 ml  ?Net -2000 ml  ? ? ? ?Physical Exam ? ?Awake Alert x2, No new F.N deficits, Normal affect ?[place].AT,PERRAL ?Supple  Neck, No JVD,   ?Symmetrical Chest wall movement, Good air movement bilaterally, CTAB ?RRR,No Gallops, Rubs or new Murmurs,  ?+ve B.Sounds, Abd Soft, No tenderness,   ?No Cyanosis, trace edema ?   ? ? Data Review:  ? ? ?CBC ?Recent Labs  ?Lab 03/11/22 ?6734 03/12/22 ?1937 03/13/22 ?9024 03/14/22 ?0143 03/15/22 ?0113  ?WBC 7.2 6.3 5.2 5.3 6.0  ?HGB 10.8* 10.3* 10.1* 10.1* 10.2*  ?HCT 35.0* 33.4* 32.8* 32.9* 32.5*  ?PLT 282 295 260 257 250  ?MCV 81.6 81.3 81.6 81.8 81.5  ?MCH 25.2* 25.1* 25.1* 25.1* 25.6*  ?MCHC 30.9 30.8 30.8 30.7 31.4  ?RDW 15.3 15.3 15.2 15.1 15.0  ?LYMPHSABS 1.5 1.8 1.6 1.6 1.6  ?MONOABS 0.5 0.5 0.5 0.4 0.5  ?EOSABS 0.1 0.1 0.1 0.1 0.1  ?BASOSABS 0.0 0.0 0.0 0.0 0.0  ? ? ?Electrolytes ?Recent Labs  ?Lab  03/10/22 ?2153 03/11/22 ?0973 03/12/22 ?5329 03/13/22 ?9242 03/14/22 ?0143 03/15/22 ?0113  ?NA  --  137 138 138 139 138  ?K  --  3.9 4.1 4.3 4.4 4.1  ?CL  --  106 105 106 108 106  ?CO2  --  21* 26 24 25 24   ?GLUCOSE  --  89 203* 208* 216* 216*  ?BUN  --  27* 33* 34* 35* 48*  ?CREATININE  --  2.42* 2.84* 2.77* 2.76* 3.06*  ?CALCIUM  --  9.0 9.0 8.8* 8.8* 8.9  ?AST  --  21 16 15 15 17   ?ALT  --  16 16 15 15 17   ?ALKPHOS  --  61 64 61 63 59  ?BILITOT  --  0.5 <0.1* 0.1* 0.2* 0.5  ?ALBUMIN  --  2.9* 3.0* 2.9* 3.0* 3.0*  ?MG 1.8 1.6* 2.3 2.1 2.1 2.0  ?TSH 0.817 0.831  --   --   --   --   ?HGBA1C  --  9.7*  --   --   --   --   ?BNP  --   --   --   --  30.8 32.8  ? ? ?------------------------------------------------------------------------------------------------------------------ ?No results for input(s): CHOL, HDL, LDLCALC, TRIG, CHOLHDL, LDLDIRECT in the last 72 hours. ? ?Lab Results  ?Component Value Date  ? HGBA1C 9.7 (H) 03/11/2022  ? ? ?No results for input(s): TSH, T4TOTAL, T3FREE, THYROIDAB in the last 72 hours. ? ?Invalid input(s): FREET3 ? ?------------------------------------------------------------------------------------------------------------------ ?ID Labs ?Recent Labs  ?Lab 03/11/22 ?6834 03/12/22 ?1962 03/13/22 ?2297 03/14/22 ?0143 03/15/22 ?0113  ?WBC 7.2 6.3 5.2 5.3 6.0  ?PLT 282 295 260 257 250  ?CREATININE 2.42* 2.84* 2.77* 2.76* 3.06*  ? ?Cardiac Enzymes ?No results for input(s): CKMB, TROPONINI, MYOGLOBIN in the last 168 hours. ? ?Invalid input(s): CK ? ? ?Radiology Reports ?US RENAL ? ?Result Date: 03/15/2022 ?CLINICAL DATA:  Acute renal insufficiency. History of hypertension and diabetes. EXAM: RENAL / URINARY TRACT ULTRASOUND COMPLETE COMPARISON:  Renal ultrasound 12/08/2021 FINDINGS: Right Kidney: Renal measurements: 11.3 x 5.9 x 7.2 cm = volume: 250.3 mL. Normal renal cortical thickness and echogenicity. No renal lesions, renal calculi or hydronephrosis. Left Kidney: Renal measurements: 12.4 x  6.6 x 7.0 cm = volume: 296.9 mL. Normal renal cortical thickness and echogenicity without focal lesions or hydronephrosis. Bladder: Appears normal for degree of bladder distention. Other: None. IMPRESSION: Normal

## 2022-03-16 DIAGNOSIS — N179 Acute kidney failure, unspecified: Secondary | ICD-10-CM

## 2022-03-16 LAB — CBC WITH DIFFERENTIAL/PLATELET
Abs Immature Granulocytes: 0.06 10*3/uL (ref 0.00–0.07)
Basophils Absolute: 0 10*3/uL (ref 0.0–0.1)
Basophils Relative: 1 %
Eosinophils Absolute: 0.1 10*3/uL (ref 0.0–0.5)
Eosinophils Relative: 2 %
HCT: 32.6 % — ABNORMAL LOW (ref 39.0–52.0)
Hemoglobin: 9.9 g/dL — ABNORMAL LOW (ref 13.0–17.0)
Immature Granulocytes: 1 %
Lymphocytes Relative: 28 %
Lymphs Abs: 1.7 10*3/uL (ref 0.7–4.0)
MCH: 25 pg — ABNORMAL LOW (ref 26.0–34.0)
MCHC: 30.4 g/dL (ref 30.0–36.0)
MCV: 82.3 fL (ref 80.0–100.0)
Monocytes Absolute: 0.5 10*3/uL (ref 0.1–1.0)
Monocytes Relative: 9 %
Neutro Abs: 3.6 10*3/uL (ref 1.7–7.7)
Neutrophils Relative %: 59 %
Platelets: 270 10*3/uL (ref 150–400)
RBC: 3.96 MIL/uL — ABNORMAL LOW (ref 4.22–5.81)
RDW: 15.3 % (ref 11.5–15.5)
WBC: 6 10*3/uL (ref 4.0–10.5)
nRBC: 0 % (ref 0.0–0.2)

## 2022-03-16 LAB — COMPREHENSIVE METABOLIC PANEL
ALT: 23 U/L (ref 0–44)
AST: 23 U/L (ref 15–41)
Albumin: 3.1 g/dL — ABNORMAL LOW (ref 3.5–5.0)
Alkaline Phosphatase: 60 U/L (ref 38–126)
Anion gap: 11 (ref 5–15)
BUN: 57 mg/dL — ABNORMAL HIGH (ref 6–20)
CO2: 21 mmol/L — ABNORMAL LOW (ref 22–32)
Calcium: 9.2 mg/dL (ref 8.9–10.3)
Chloride: 105 mmol/L (ref 98–111)
Creatinine, Ser: 3.54 mg/dL — ABNORMAL HIGH (ref 0.61–1.24)
GFR, Estimated: 19 mL/min — ABNORMAL LOW (ref >60)
Glucose, Bld: 200 mg/dL — ABNORMAL HIGH (ref 70–99)
Potassium: 4.1 mmol/L (ref 3.5–5.1)
Sodium: 137 mmol/L (ref 135–145)
Total Bilirubin: 0.3 mg/dL (ref 0.3–1.2)
Total Protein: 6.7 g/dL (ref 6.5–8.1)

## 2022-03-16 LAB — GLUCOSE, CAPILLARY
Glucose-Capillary: 181 mg/dL — ABNORMAL HIGH (ref 70–99)
Glucose-Capillary: 157 mg/dL — ABNORMAL HIGH (ref 70–99)
Glucose-Capillary: 192 mg/dL — ABNORMAL HIGH (ref 70–99)
Glucose-Capillary: 189 mg/dL — ABNORMAL HIGH (ref 70–99)

## 2022-03-16 LAB — MAGNESIUM: Magnesium: 2.1 mg/dL (ref 1.7–2.4)

## 2022-03-16 LAB — BRAIN NATRIURETIC PEPTIDE: B Natriuretic Peptide: 16.8 pg/mL (ref 0.0–100.0)

## 2022-03-16 MED ORDER — FUROSEMIDE 40 MG PO TABS
40.0000 mg | ORAL_TABLET | Freq: Two times a day (BID) | ORAL | Status: DC
  Administered 2022-03-17: 40 mg via ORAL
  Filled 2022-03-16: qty 1

## 2022-03-16 MED ORDER — ROSUVASTATIN CALCIUM 5 MG PO TABS
10.0000 mg | ORAL_TABLET | Freq: Every day | ORAL | Status: DC
  Administered 2022-03-16: 10 mg via ORAL
  Filled 2022-03-16: qty 2

## 2022-03-16 NOTE — Care Management Important Message (Signed)
Important Message ? ?Patient Details  ?Name: Jeffrey [name] ?MRN: [id number] ?Date of Birth: 1966/10/27 ? ? ?Medicare Important Message Given:  Yes ? ? ? ? ?Wilhemina Grall ?03/16/2022, 3:52 PM ?

## 2022-03-16 NOTE — Progress Notes (Signed)
?                                  PROGRESS NOTE                                             ?                                                                                                                     ?                                         ? ? Patient Demographics:  ? ? Jeffrey [name], is a 56 y.o. male, DOB - Jul 12, 1966, UMP:[id number] ? ?Outpatient Primary MD for the patient is Charlott Rakes, MD    LOS - 3  Admit date - 03/10/2022   ? ?Chief Complaint  ?Patient presents with  ? Hypoglycemia  ?    ? ?Brief Narrative (HPI from H&P)   56 y.o. male with medical history significant of  non ischemic cardiomyopathy, sp AiCD, non-obstructive CAD, CKD, DM2 on insulin, HTN, HLD, CVA presented to the hospital with weakness, confusion found to be hypoglycemic and admitted for further care. ? ? Subjective:  ? ?Patient denies any chest pain, shortness of breath, no fever, no chills.   ? ? ? Assessment  & Plan :  ? ?Weakness, metabolic encephalopathy all stemming from hypoglycemia caused by insulin and oral intake mismatch .  His insulin has been held, his A1c actually suggests poor outpatient control due to hyperglycemia, he is extremely noncompliant with his Accu-Cheks and diet according to the wife, counseled, currently on sliding scale low-dose Lantus added and dose further adjusted on 03/15/2022 continue to monitor.  Diabetic and insulin education has been provided.  Mentation appears to be improving today, he is awake alert x3, coherent and following commands. ? ?Lab Results  ?Component Value Date  ? HGBA1C 9.7 (H) 03/11/2022  ? ? ?CBG (last 3)  ?Recent Labs  ?  03/15/22 ?1604 03/15/22 ?2226 03/16/22 ?0801  ?GLUCAP 165* 246* 181*  ? ? ?2.  AKI on CKD 3B.  Baseline creatinine around 2.2.  He was initially hydrated his home dose Lasix was held for the first 2 days however renal function continued to decline and he sequently developed some edema, -GI follow-up greatly appreciated,  with evidence of volume overload, she is started on IV diuresis, which is currently on hold today given worsening renal function, renal to follow-up later today regarding further recommendations. ? ?3.  Acute on chronic chronic systolic heart failure.  Last EF 35% last year has AICD.   continue Coreg, hydralazine and Imdur.  With evidence of volume overload with  holding home dose Lasix and IV hydration, back on IV diuresis, given worsening creatinine today diuresis has been held. ? ?4.  Hypertension.  Stable on Coreg, Hydralazine and Imdur along with diuretic as tolerated. ? ?5.  Dyslipidemia.  On statin continue. ? ?6.  BPH.  On Flomax. ? ?7.  Weakness with multiple falls at home.  PT OT. ? ?8.  Lower extremity edema - Leg [place] -ve, Lasix, TEDs. ? ?9.  Note continued encephalopathy.  Although head CT is unremarkable and currently no focal deficits, on aspirin and statin for secondary prevention however continues to be slightly confused according to the wife he recently had similar presentation and was found to have 2 remote strokes, hold Neurontin, ordered MRI which could not be done due to his new AICD device per cardiology, repeat CT ordered on 03/15/2022.  Continue to monitor. ? ? ?   ? ?Condition - Fair ? ?Family Communication  :  none at bedside ? ?Code Status :  Full ? ?Consults  :  Nephrology ? ?PUD Prophylaxis :  ? ? Procedures  :    ? ?MRI - ? ?Leg [place] - No DVT. ? ?CT head - Non Acute ? ?   ? ?Disposition Plan  :   ? ?Status is: Observation ? ?DVT Prophylaxis  :  Heparin ? ? ?Lab Results  ?Component Value Date  ? PLT 270 03/16/2022  ? ? ?Diet :  ?Diet Order   ? ?       ?  Diet Heart Room service appropriate? Yes; Fluid consistency: Thin  Diet effective now       ?  ? ?  ?  ? ?  ?  ? ?Inpatient Medications ? ?Scheduled Meds: ? aspirin  81 mg Oral Daily  ? carvedilol  12.5 mg Oral BID WC  ? heparin injection (subcutaneous)  5,000 Units Subcutaneous Q8H  ? hydrALAZINE  100 mg Oral TID  ? insulin aspart  0-5 Units  Subcutaneous QHS  ? insulin aspart  0-9 Units Subcutaneous TID WC  ? insulin glargine-yfgn  14 Units Subcutaneous Daily  ? isosorbide mononitrate  60 mg Oral Daily  ? rosuvastatin  40 mg Oral QHS  ? sodium chloride flush  3 mL Intravenous Q12H  ? tamsulosin  0.4 mg Oral QPC supper  ? ?Continuous Infusions: ? sodium chloride    ? ?PRN Meds:.sodium chloride, acetaminophen **OR** acetaminophen, albuterol, dextrose, sodium chloride flush ? ?Antibiotics  :   ? ?Anti-infectives (From admission, onward)  ? ? None  ? ?  ? ? ? ? ?Phillips Climes M.D on 03/16/2022 at 10:43 AM ? ?To page go to [email or web address]  ? ?[facility] -  Office  [phone] ? ?See all Orders from today for further details ? ? ? Objective:  ? ?Vitals:  ? 03/16/22 0000 03/16/22 0423 03/16/22 0500 03/16/22 0758  ?BP: (!) 154/95 (!) 142/66  (!) 159/93  ?Pulse:  90  89  ?Resp: 18 19 16 16   ?Temp:  98.9 ?F (37.2 ?C)  98.3 ?F (36.8 ?C)  ?TempSrc: Oral Oral  Oral  ?SpO2: 96% 97% 97% 93%  ?Weight:      ?Height:      ? ? ?Wt Readings from Last 3 Encounters:  ?03/10/22 97.3 kg  ?03/09/22 100.8 kg  ?11/26/21 97.5 kg  ? ? ? ?Intake/Output Summary (Last 24 hours) at 03/16/2022 1043 ?Last data filed at 03/16/2022 0900 ?Gross per 24 hour  ?Intake --  ?Output 2000 ml  ?Net -2000  ml  ? ? ? ?Physical Exam ? ?Awake Alert, Oriented X 3, No new F.N deficits, frail. ?Symmetrical Chest wall movement, Good air movement bilaterally, CTAB ?RRR,No Gallops,Rubs or new Murmurs, No Parasternal Heave ?+ve B.Sounds, Abd Soft, No tenderness, No rebound - guarding or rigidity. ?No Cyanosis, Clubbing or edema, No new Rash or bruise   ? ? Data Review:  ? ? ?CBC ?Recent Labs  ?Lab 03/12/22 ?4174 03/13/22 ?0814 03/14/22 ?0143 03/15/22 ?0113 03/16/22 ?0101  ?WBC 6.3 5.2 5.3 6.0 6.0  ?HGB 10.3* 10.1* 10.1* 10.2* 9.9*  ?HCT 33.4* 32.8* 32.9* 32.5* 32.6*  ?PLT 295 260 257 250 270  ?MCV 81.3 81.6 81.8 81.5 82.3  ?MCH 25.1* 25.1* 25.1* 25.6* 25.0*  ?MCHC 30.8 30.8 30.7 31.4 30.4  ?RDW 15.3 15.2  15.1 15.0 15.3  ?LYMPHSABS 1.8 1.6 1.6 1.6 1.7  ?MONOABS 0.5 0.5 0.4 0.5 0.5  ?EOSABS 0.1 0.1 0.1 0.1 0.1  ?BASOSABS 0.0 0.0 0.0 0.0 0.0  ? ? ?Electrolytes ?Recent Labs  ?Lab 03/10/22 ?2153 03/11/22 ?4818 03/12/22 ?5631 03/13/22 ?4970 03/14/22 ?0143 03/15/22 ?0113 03/16/22 ?0101  ?NA  --  137 138 138 139 138 137  ?K  --  3.9 4.1 4.3 4.4 4.1 4.1  ?CL  --  106 105 106 108 106 105  ?CO2  --  21* 26 24 25 24  21*  ?GLUCOSE  --  89 203* 208* 216* 216* 200*  ?BUN  --  27* 33* 34* 35* 48* 57*  ?CREATININE  --  2.42* 2.84* 2.77* 2.76* 3.06* 3.54*  ?CALCIUM  --  9.0 9.0 8.8* 8.8* 8.9 9.2  ?AST  --  21 16 15 15 17 23   ?ALT  --  16 16 15 15 17 23   ?ALKPHOS  --  61 64 61 63 59 60  ?BILITOT  --  0.5 <0.1* 0.1* 0.2* 0.5 0.3  ?ALBUMIN  --  2.9* 3.0* 2.9* 3.0* 3.0* 3.1*  ?MG 1.8 1.6* 2.3 2.1 2.1 2.0 2.1  ?TSH 0.817 0.831  --   --   --   --   --   ?HGBA1C  --  9.7*  --   --   --   --   --   ?BNP  --   --   --   --  30.8 32.8 16.8  ? ? ?------------------------------------------------------------------------------------------------------------------ ?No results for input(s): CHOL, HDL, LDLCALC, TRIG, CHOLHDL, LDLDIRECT in the last 72 hours. ? ?Lab Results  ?Component Value Date  ? HGBA1C 9.7 (H) 03/11/2022  ? ? ?No results for input(s): TSH, T4TOTAL, T3FREE, THYROIDAB in the last 72 hours. ? ?Invalid input(s): FREET3 ? ?------------------------------------------------------------------------------------------------------------------ ?ID Labs ?Recent Labs  ?Lab 03/12/22 ?2637 03/13/22 ?8588 03/14/22 ?0143 03/15/22 ?0113 03/16/22 ?0101  ?WBC 6.3 5.2 5.3 6.0 6.0  ?PLT 295 260 257 250 270  ?CREATININE 2.84* 2.77* 2.76* 3.06* 3.54*  ? ?Cardiac Enzymes ?No results for input(s): CKMB, TROPONINI, MYOGLOBIN in the last 168 hours. ? ?Invalid input(s): CK ? ? ?Radiology Reports ?CT HEAD WO CONTRAST (5MM) ? ?Result Date: 03/15/2022 ?CLINICAL DATA:  Delirium, altered mental status EXAM: CT HEAD WITHOUT CONTRAST TECHNIQUE: Contiguous axial images  were obtained from the base of the skull through the vertex without intravenous contrast. RADIATION DOSE REDUCTION: This exam was performed according to the departmental dose-optimization program which inc

## 2022-03-16 NOTE — Progress Notes (Signed)
Inpatient Diabetes Program Recommendations ? ?AACE/ADA: New Consensus Statement on Inpatient Glycemic Control ? ?Target Ranges:  Prepandial:   less than 140 mg/dL ?     Peak postprandial:   less than 180 mg/dL (1-2 hours) ?     Critically ill patients:  140 - 180 mg/dL  ? ? Latest Reference Range & Units 03/15/22 07:55 03/15/22 11:43 03/15/22 16:04 03/15/22 22:26 03/16/22 08:01  ?Glucose-Capillary 70 - 99 mg/dL 227 (H) 215 (H) 165 (H) 246 (H) 181 (H)  ? ?Review of Glycemic Control ? ?Diabetes history: DM2 ?Outpatient Diabetes medications: Lantus 35 units QHS, Novolog 0-9 units TID ?Current orders for Inpatient glycemic control: Semglee 14 units daily, Novolog 0-9 units TID with meals, Novolog 0-5 units QHS ? ?Inpatient Diabetes Program Recommendations:   ? ?Insulin: Please consider ordering Novolog 2 units TID with meals for meal coverage if patient eats at least 50% of meals. ? ?Thanks, ?Barnie Alderman, RN, MSN, CDE ?Diabetes Coordinator ?Inpatient Diabetes Program ?[phone] (Team Pager from 8am to 5pm) ? ? ?

## 2022-03-16 NOTE — Progress Notes (Signed)
?[facility] ?Progress Note  ? ? ?Assessment/ Plan:   ? Aki on CKD 3b: looks volume overloaded and in the setting of EF 30-35% will need to get fluid off.  Ua with > 300 mg proteinuria, will send off UP/C ?- Lasix held this AM- will resume home dosing tomorrow (40 PO BID)--> his swelling is better and his BNP surprisingly is not high ?- avoid neprhotoxins ?- will do strict I/O ?- consider repeat TTE ?  ?2.  Hypoglycemia: ?- insulin adjusted per primary- sugars are more consistent ?- A1c 9.7 ?  ?3.  HTN\ ?- under reasonable control for now ?  ?4.  Acute on chronic systolic CHF: EF 97-98% ?- SO describes 2 months of abd and LE swelling consistent with CHF exac ?- lasix as above ?  ?5.  Encephalopathy: ?- Ct head on admission negative ?- MRI not possible d/t AICD ?- repeat CT without new strokes ?  ?6.  Dispo: pending ? ?Subjective:   ? ?Seen in room.  AAO x 3 this AM.  Head CT without new strokes.  Lasix held this AM, Cr bumped a little today.    ? ?Objective:   ?BP (!) 159/93 (BP Location: Right Arm)   Pulse 89   Temp 98.3 ?F (36.8 ?C) (Oral)   Resp 16   Ht 5\' 7"  (1.702 m)   Wt 97.3 kg   SpO2 93%   BMI 33.60 kg/m?  ? ?Intake/Output Summary (Last 24 hours) at 03/16/2022 1530 ?Last data filed at 03/16/2022 0900 ?Gross per 24 hour  ?Intake --  ?Output 2000 ml  ?Net -2000 ml  ? ?Weight change:  ? ?Physical Exam: ?GEN NAD, intermittently falling asleep, sitting up in chair ?HEENT anicteric sclear ?NECK no JVD upright ?PULM some coarse basilar breath sounds ?CV RRR ?ABD somewhat distended/ protuberant, no frank fluid wave ?EXT 1+ LE edema, much improved ?NEURO AAO x 3 ? ?Imaging: ?CT HEAD WO CONTRAST (5MM) ? ?Result Date: 03/15/2022 ?CLINICAL DATA:  Delirium, altered mental status EXAM: CT HEAD WITHOUT CONTRAST TECHNIQUE: Contiguous axial images were obtained from the base of the skull through the vertex without intravenous contrast. RADIATION DOSE REDUCTION: This exam was performed according to the  departmental dose-optimization program which includes automated exposure control, adjustment of the mA and/or kV according to patient size and/or use of iterative reconstruction technique. COMPARISON:  Head CT 03/10/2022 FINDINGS: Brain: No evidence of acute intracranial hemorrhage or extra-axial collection.Unchanged chronic bilateral basal ganglia lacunar infarcts.The ventricles are normal in size.Scattered subcortical and periventricular white matter hypodensities, nonspecific but likely sequela of chronic small vessel ischemic disease. Vascular: No hyperdense vessel. Skull: Negative for skull fracture. Sinuses/Orbits: Postprocedural changes in the right globe. No acute abnormality. Minimal paranasal sinus mucosal thickening. The mastoid air cells are clear. Other: None. IMPRESSION: No acute intracranial abnormality. Unchanged chronic basal ganglia lacunar infarcts and chronic small vessel ischemic disease. Electronically Signed   By: Maurine Simmering M.D.   On: 03/15/2022 12:43  ? ?US RENAL ? ?Result Date: 03/15/2022 ?CLINICAL DATA:  Acute renal insufficiency. History of hypertension and diabetes. EXAM: RENAL / URINARY TRACT ULTRASOUND COMPLETE COMPARISON:  Renal ultrasound 12/08/2021 FINDINGS: Right Kidney: Renal measurements: 11.3 x 5.9 x 7.2 cm = volume: 250.3 mL. Normal renal cortical thickness and echogenicity. No renal lesions, renal calculi or hydronephrosis. Left Kidney: Renal measurements: 12.4 x 6.6 x 7.0 cm = volume: 296.9 mL. Normal renal cortical thickness and echogenicity without focal lesions or hydronephrosis. Bladder: Appears normal for degree of  bladder distention. Other: None. IMPRESSION: Normal renal ultrasound examination. Electronically Signed   By: Marijo Sanes M.D.   On: 03/15/2022 09:22   ? ?Labs: ?BMET ?Recent Labs  ?Lab 03/10/22 ?1500 03/10/22 ?2153 03/11/22 ?9166 03/12/22 ?0600 03/13/22 ?4599 03/14/22 ?0143 03/15/22 ?0113 03/16/22 ?0101  ?NA 138  --  137 138 138 139 138 137  ?K 4.2  --  3.9  4.1 4.3 4.4 4.1 4.1  ?CL 108  --  106 105 106 108 106 105  ?CO2 21*  --  21* 26 24 25 24  21*  ?GLUCOSE 135*  --  89 203* 208* 216* 216* 200*  ?BUN 24*  --  27* 33* 34* 35* 48* 57*  ?CREATININE 2.34*  --  2.42* 2.84* 2.77* 2.76* 3.06* 3.54*  ?CALCIUM 8.9  --  9.0 9.0 8.8* 8.8* 8.9 9.2  ?PHOS  --  4.2 4.3  --   --   --   --   --   ? ?CBC ?Recent Labs  ?Lab 03/13/22 ?7741 03/14/22 ?0143 03/15/22 ?0113 03/16/22 ?0101  ?WBC 5.2 5.3 6.0 6.0  ?NEUTROABS 3.0 3.2 3.7 3.6  ?HGB 10.1* 10.1* 10.2* 9.9*  ?HCT 32.8* 32.9* 32.5* 32.6*  ?MCV 81.6 81.8 81.5 82.3  ?PLT 260 257 250 270  ? ? ?Medications:   ? ? aspirin  81 mg Oral Daily  ? carvedilol  12.5 mg Oral BID WC  ? heparin injection (subcutaneous)  5,000 Units Subcutaneous Q8H  ? hydrALAZINE  100 mg Oral TID  ? insulin aspart  0-5 Units Subcutaneous QHS  ? insulin aspart  0-9 Units Subcutaneous TID WC  ? insulin glargine-yfgn  14 Units Subcutaneous Daily  ? isosorbide mononitrate  60 mg Oral Daily  ? rosuvastatin  10 mg Oral QHS  ? sodium chloride flush  3 mL Intravenous Q12H  ? tamsulosin  0.4 mg Oral QPC supper  ? ? ? ? ?Madelon Lips MD ?03/16/2022, 3:30 PM   ?

## 2022-03-17 ENCOUNTER — Other Ambulatory Visit (HOSPITAL_COMMUNITY): Payer: Self-pay

## 2022-03-17 LAB — COMPREHENSIVE METABOLIC PANEL
ALT: 25 U/L (ref 0–44)
AST: 24 U/L (ref 15–41)
Albumin: 2.9 g/dL — ABNORMAL LOW (ref 3.5–5.0)
Alkaline Phosphatase: 64 U/L (ref 38–126)
Anion gap: 10 (ref 5–15)
BUN: 52 mg/dL — ABNORMAL HIGH (ref 6–20)
CO2: 21 mmol/L — ABNORMAL LOW (ref 22–32)
Calcium: 9 mg/dL (ref 8.9–10.3)
Chloride: 105 mmol/L (ref 98–111)
Creatinine, Ser: 3.16 mg/dL — ABNORMAL HIGH (ref 0.61–1.24)
GFR, Estimated: 22 mL/min — ABNORMAL LOW (ref >60)
Glucose, Bld: 248 mg/dL — ABNORMAL HIGH (ref 70–99)
Potassium: 4 mmol/L (ref 3.5–5.1)
Sodium: 136 mmol/L (ref 135–145)
Total Bilirubin: 0.4 mg/dL (ref 0.3–1.2)
Total Protein: 6.7 g/dL (ref 6.5–8.1)

## 2022-03-17 LAB — CBC WITH DIFFERENTIAL/PLATELET
Abs Immature Granulocytes: 0.03 10*3/uL (ref 0.00–0.07)
Basophils Absolute: 0 10*3/uL (ref 0.0–0.1)
Basophils Relative: 0 %
Eosinophils Absolute: 0.1 10*3/uL (ref 0.0–0.5)
Eosinophils Relative: 1 %
HCT: 33.9 % — ABNORMAL LOW (ref 39.0–52.0)
Hemoglobin: 10.5 g/dL — ABNORMAL LOW (ref 13.0–17.0)
Immature Granulocytes: 1 %
Lymphocytes Relative: 22 %
Lymphs Abs: 1.2 10*3/uL (ref 0.7–4.0)
MCH: 25.2 pg — ABNORMAL LOW (ref 26.0–34.0)
MCHC: 31 g/dL (ref 30.0–36.0)
MCV: 81.3 fL (ref 80.0–100.0)
Monocytes Absolute: 0.4 10*3/uL (ref 0.1–1.0)
Monocytes Relative: 7 %
Neutro Abs: 3.8 10*3/uL (ref 1.7–7.7)
Neutrophils Relative %: 69 %
Platelets: 257 10*3/uL (ref 150–400)
RBC: 4.17 MIL/uL — ABNORMAL LOW (ref 4.22–5.81)
RDW: 15.1 % (ref 11.5–15.5)
WBC: 5.6 10*3/uL (ref 4.0–10.5)
nRBC: 0 % (ref 0.0–0.2)

## 2022-03-17 LAB — GLUCOSE, CAPILLARY
Glucose-Capillary: 201 mg/dL — ABNORMAL HIGH (ref 70–99)
Glucose-Capillary: 170 mg/dL — ABNORMAL HIGH (ref 70–99)

## 2022-03-17 LAB — MAGNESIUM: Magnesium: 1.9 mg/dL (ref 1.7–2.4)

## 2022-03-17 LAB — BRAIN NATRIURETIC PEPTIDE: B Natriuretic Peptide: 19 pg/mL (ref 0.0–100.0)

## 2022-03-17 MED ORDER — GABAPENTIN 300 MG PO CAPS: 300.0000 mg | ORAL_CAPSULE | Freq: Every day | ORAL | 6 refills | Status: DC

## 2022-03-17 MED ORDER — FUROSEMIDE 40 MG PO TABS: 40.0000 mg | ORAL_TABLET | Freq: Two times a day (BID) | ORAL | 6 refills | Status: DC

## 2022-03-17 MED ORDER — HYDRALAZINE HCL 100 MG PO TABS
100.0000 mg | ORAL_TABLET | Freq: Three times a day (TID) | ORAL | 0 refills | Status: DC
  Filled 2022-03-17: qty 90, 30d supply, fill #0

## 2022-03-17 MED ORDER — INSULIN GLARGINE-YFGN 100 UNIT/ML ~~LOC~~ SOLN
20.0000 [IU] | Freq: Every day | SUBCUTANEOUS | Status: DC
  Administered 2022-03-17: 20 [IU] via SUBCUTANEOUS
  Filled 2022-03-17: qty 0.2

## 2022-03-17 MED ORDER — POTASSIUM CHLORIDE CRYS ER 20 MEQ PO TBCR: 20.0000 meq | EXTENDED_RELEASE_TABLET | Freq: Two times a day (BID) | ORAL | 3 refills | Status: DC

## 2022-03-17 MED ORDER — LANTUS SOLOSTAR 100 UNIT/ML ~~LOC~~ SOPN: 25.0000 [IU] | PEN_INJECTOR | Freq: Every day | SUBCUTANEOUS | 6 refills | Status: DC

## 2022-03-17 MED ORDER — TAMSULOSIN HCL 0.4 MG PO CAPS
ORAL_CAPSULE | ORAL | 0 refills | Status: DC
Start: 2022-03-17 — End: 2022-04-27
  Filled 2022-03-17: qty 30, 30d supply, fill #0

## 2022-03-17 MED ORDER — AMLODIPINE BESYLATE 10 MG PO TABS
10.0000 mg | ORAL_TABLET | Freq: Every day | ORAL | 0 refills | Status: DC
  Filled 2022-03-17: qty 30, 30d supply, fill #0

## 2022-03-17 MED ORDER — AMLODIPINE BESYLATE 10 MG PO TABS
10.0000 mg | ORAL_TABLET | Freq: Every day | ORAL | Status: DC
  Administered 2022-03-17: 10 mg via ORAL
  Filled 2022-03-17: qty 1

## 2022-03-17 NOTE — TOC Transition Note (Signed)
Transition of Care (TOC) - CM/SW Discharge Note ? ? ?Patient Details  ?Name: Jeffrey [name] ?MRN: [id number] ?Date of Birth: 1966-10-08 ? ?Transition of Care (TOC) CM/SW Contact:  ?Cyndi Bender, RN ?Phone Number: ?03/17/2022, 9:43 AM ? ? ?Clinical Narrative:    ?Patient stable for discharge. Patient declines HH-OT and doesn't want any DME. Patient states he has transportation home and to apts. Follow up apts on AV[facility]. ? ? ? ?Final next level of care: Home/Self Care ?Barriers to Discharge: Barriers Resolved ? ? ?Patient Goals and CMS Choice ?Patient states their goals for this hospitalization and ongoing recovery are:: return home ?  ?  ? ?Discharge Placement ?  ?           ?  ? home ?  ?  ? ?Discharge Plan and Services ?  ?  ?           ? home ?  ?  ?  ?  ?  ?  ?  ?  ?  ? ?Social Determinants of Health (SDOH) Interventions ?  ? ? ?Readmission Risk Interventions ?   ? View : No data to display.  ?  ?  ?  ? ? ? ? ? ?

## 2022-03-17 NOTE — Consult Note (Signed)
[facility] CM Inpatient Consult ? ? ?03/17/2022 ? ?Jeffrey [name] ?10-31-1966 ?[id number] ? ?[facility] CM) ?  ?Patient chart reviewed with noted high risk score for unplanned readmission. Assessed for [facility] CM post hospital chronic disease management needs. ? ?Spoke with patient bedside. Explained [facility] CM services. Appointment reminder card provided for post hospital PCP follow up. Patient declines [facility] CM services at this time. States that he has transportation, no needs for meals, and gets medication through [facility].  ? ?Of note, [facility] Care Management services does not replace or interfere with any services that are arranged by inpatient case management or social work.  ? ?Netta Cedars, MSN, RN ?[facility]iaison ?Phone [phone] ?Toll free office [phone]  ?

## 2022-03-17 NOTE — Discharge Summary (Signed)
?                                                                                ? ?Jeffrey [name]:[id number] DOB: [birth year] DOA: 03/10/2022 ? ?PCP: Charlott Rakes, MD ? ?Admit date: 03/10/2022  Discharge date: 03/17/2022 ? ?Admitted From: Home   Disposition:  Home ? ? ?Recommendations for Outpatient Follow-up:  ? ?Follow up with PCP in 1-2 weeks ? ?PCP Please obtain BMP/CBC, 2 view CXR in 1week,  (see Discharge instructions)  ? ?PCP Please follow up on the following pending results: Monitor CBGS and BMP closely ? ? ?Home Health: PT, RN if qualifies   ?Equipment/Devices: None  ?Consultations: Renal ?Discharge Condition: Stable    ?CODE STATUS: Full    ?Diet Recommendation: Heart Healthy Low Carb, 1.5 lit/day fluid restriction ?  ? ?Chief Complaint  ?Patient presents with  ? Hypoglycemia  ?  ? ?Brief history of present illness from the day of admission and additional interim summary   ? ?56 y.o. male with medical history significant of  non ischemic cardiomyopathy, sp AiCD, non-obstructive CAD, CKD, DM2 on insulin, HTN, HLD, CVA presented to the hospital with weakness, confusion found to be hypoglycemic and admitted for further care. ? ?                                                               Hospital Course  ? ?  ?Weakness, metabolic encephalopathy all stemming from hypoglycemia caused by insulin and oral intake mismatch .  His insulin has been held, his A1c actually suggests poor outpatient control due to hyperglycemia, he is extremely noncompliant with his Accu-Cheks and diet according to the wife, counseled, currently on sliding scale low-dose Lantus added and dose further adjusted on 03/15/2022 continue to monitor.  Diabetic and insulin education has been provided.  Mentation appears to be improving today, he is awake alert x3, coherent and following commands. ? ?Lab Results  ?Component Value Date  ? HGBA1C 9.7 (H)  03/11/2022  ? ?CBG (last 3)  ?Recent Labs  ?  03/16/22 ?1606 03/16/22 ?2041 03/17/22 ?9485  ?GLUCAP 192* 189* 201*  ? ? ?2.  AKI on CKD 3B.  Baseline creatinine around 2.2.  Seen by nephrology was slightly fluid overloaded, renal function much improved with diuresis, continue close outpatient follow-up with PCP and primary nephrologist postdischarge. ?  ?3.  Acute on chronic chronic systolic heart failure.  Last EF 35% last year has AICD.   continue Coreg, hydralazine and Imdur.  With evidence of volume overload with holding home dose Lasix and IV hydration, back on IV diuresis, given worsening creatinine today diuresis has been held. ?  ?4.  Hypertension.  Was on combination of Coreg, Hydralazine and Imdur along with diuretic, blood pressure still high hydralazine dose has been increased and Norvasc added.  PCP to monitor blood pressure closely and adjust dose as needed. ?  ?5.  Dyslipidemia.  On statin continue. ?  ?6.  BPH.  On Flomax. ?  ?7.  Weakness with multiple falls at home.  PT OT. ?  ?8.  Lower extremity edema - Leg [place] -ve, Lasix continue along with fluid restriction, edema improving. ?  ?9.  Note continued encephalopathy.  Although head CT is unremarkable and currently no focal deficits, on aspirin and statin for secondary prevention however continues to be slightly confused according to the wife he recently had similar presentation and was found to have 2 remote strokes,  ordered MRI which could not be done due to his new AICD device per cardiology, repeat CT ordered on 03/15/2022 was unremarkable, Neurontin was held with much improvement in encephalopathy, only minimally confused, no focal deficits, will be discharged home with close outpatient PCP follow-up.  Neurontin dose upon discharge has been cut in half. ?  ? ?Discharge diagnosis   ? ? ?Principal Problem: ?  Hypoglycemia ?Active Problems: ?  Essential hypertension ?  Diabetic neuropathy ([place]) ?  Uncontrolled type 2 diabetes mellitus with  hyperglycemia, with long-term current use of insulin ([place]) ?  Chronic combined systolic (congestive) and diastolic (congestive) heart failure (HCC) ?  Hyperlipidemia ?  CAD (coronary artery disease) ?  Falls ?  CKD (chronic kidney disease), stage III ([address]) ?  Leg edema, left ?  Syncope ?  Acute renal failure ([place]) ? ? ? ?Discharge instructions   ? ?Discharge Instructions   ? ? Discharge instructions   Complete by: As directed ?  ? Accuchecks 4 times/day, Once in AM empty stomach and then before each meal. ?Log in all results and show them to your Prim.MD in 3 days. ?If any glucose reading is under 80 or above 300 call your Prim MD immidiately. ?Follow Low glucose instructions for glucose under 80 as instructed. ? ?Follow with Primary MD Charlott Rakes, MD along with your cardiologist and recommended nephrologist in 7 days  ? ?Get CBC, CMP, 2 view Chest X ray -  checked next visit within 1 week by Primary MD   ? ?Activity: As tolerated with Full fall precautions use walker/cane & assistance as needed ? ?Disposition Home   ? ?Diet: Heart Healthy low carbohydrate diet with 1.5 L fluid restriction per day. ? ?Check your Weight same time everyday, if you gain over 2 pounds, or you develop in leg swelling, experience more shortness of breath or chest pain, call your Primary MD immediately. Follow Cardiac Low Salt Diet and 1.5 lit/day fluid restriction. ? ?Special Instructions: If you have smoked or chewed Tobacco  in the last 2 yrs please stop smoking, stop any regular Alcohol  and or any Recreational drug use. ? ?On your next visit with your primary care physician please Get Medicines reviewed and adjusted. ? ?Please request your Prim.MD to go over all Hospital Tests and Procedure/Radiological results at the follow up, please get all Hospital records sent to your Prim MD by signing hospital release before you go home. ? ?If you experience worsening of your admission symptoms, develop shortness of breath, life  threatening emergency, suicidal or homicidal thoughts you must seek medical attention immediately by calling 911 or calling your MD immediately  if symptoms less severe. ? ?You Must read complete instructions/literature along with all the possible adverse reactions/side effects for all the Medicines you take and that have been prescribed to you. Take any new Medicines after you have completely understood and accpet all the possible adverse reactions/side effects.  ? Face-to-face encounter (required for Medicare/Medicaid patients)   Complete by: As  directed ?  ? I Lala Lund certify that this patient is under my care and that I, or a nurse practitioner or physician's assistant working with me, had a face-to-face encounter that meets the physician face-to-face encounter requirements with this patient on 03/17/2022. The encounter with the patient was in whole, or in part for the following medical condition(s) which is the primary reason for home health care (List medical condition): ESRD, Hypoglycmeia, DM2, weakness  ? The encounter with the patient was in whole, or in part, for the following medical condition, which is the primary reason for home health care: ESRD, Hypoglycmeia, DM2, weakness  ? I certify that, based on my findings, the following services are medically necessary home health services:  Physical therapy ?Nursing  ?  ? Reason for Medically Necessary Home Health Services: Skilled Nursing- Teaching of Disease Process/Symptom Management  ? My clinical findings support the need for the above services: Unable to leave home safely without assistance and/or assistive device  ? Further, I certify that my clinical findings support that this patient is homebound due to: Unable to leave home safely without assistance  ? Home Health   Complete by: As directed ?  ? To provide the following care/treatments:  PT ?RN  ?  ? Increase activity slowly   Complete by: As directed ?  ? ?  ? ? ?Discharge Medications   ? ?Allergies as of 03/17/2022   ?No Known Allergies ?  ? ?  ?Medication List  ?  ? ?STOP taking these medications   ? ?oxyCODONE-acetaminophen 5-325 MG tablet ?Commonly known as: PERCOCET/ROXICET ?  ? ?  ? ?TAKE these medication

## 2022-03-17 NOTE — Progress Notes (Signed)
?[facility] ?Progress Note  ? ? ?Assessment/ Plan:   ? Aki on CKD 3b: looks volume overloaded and in the setting of EF 30-35% will need to get fluid off.  Ua with > 300 mg proteinuria, will send off UP/C ?- Lasix held this AM- will resume home dosing 40 PO BID--> his swelling is better and his BNP surprisingly is not high ?- avoid neprhotoxins ?- will do strict I/O ?- consider repeat TTE, can be done as OP ?- ok for d/c from renal perspective with f/u with Dr Royce Macadamia in 3-4 weeks ?  ?2.  Hypoglycemia: ?- insulin adjusted per primary- sugars are more consistent ?- A1c 9.7 ?  ?3.  HTN\ ?- under reasonable control for now ?  ?4.  Acute on chronic systolic CHF: EF 58-30% ?- SO describes 2 months of abd and LE swelling consistent with CHF exac ?- lasix as above ?  ?5.  Encephalopathy: ?- Ct head on admission negative ?- MRI not possible d/t AICD ?- repeat CT without new strokes ?  ?6.  Dispo: d/c today ? ?Subjective:   ? ?For d/c today, CR improved.  Pt is excited to go home and see his 5 grandchildren  ? ?Objective:   ?BP (!) 167/81 (BP Location: Right Arm)   Pulse 80   Temp 97.9 ?F (36.6 ?C) (Oral)   Resp 20   Ht 5\' 7"  (1.702 m)   Wt 97.3 kg   SpO2 97%   BMI 33.60 kg/m?  ? ?Intake/Output Summary (Last 24 hours) at 03/17/2022 1258 ?Last data filed at 03/17/2022 0825 ?Gross per 24 hour  ?Intake --  ?Output 775 ml  ?Net -775 ml  ? ?Weight change:  ? ?Physical Exam: ?GEN NAD, intermittently falling asleep, sitting up in chair ?HEENT anicteric sclear ?NECK no JVD upright ?PULM some coarse basilar breath sounds ?CV RRR ?ABD somewhat distended/ protuberant, no frank fluid wave ?EXT 1+ LE edema, much improved ?NEURO AAO x 3 ? ?Imaging: ?No results found. ? ?Labs: ?BMET ?Recent Labs  ?Lab 03/10/22 ?2153 03/11/22 ?9407 03/12/22 ?6808 03/13/22 ?8110 03/14/22 ?0143 03/15/22 ?0113 03/16/22 ?0101 03/17/22 ?0320  ?NA  --  137 138 138 139 138 137 136  ?K  --  3.9 4.1 4.3 4.4 4.1 4.1 4.0  ?CL  --  106 105 106 108 106  105 105  ?CO2  --  21* 26 24 25 24  21* 21*  ?GLUCOSE  --  89 203* 208* 216* 216* 200* 248*  ?BUN  --  27* 33* 34* 35* 48* 57* 52*  ?CREATININE  --  2.42* 2.84* 2.77* 2.76* 3.06* 3.54* 3.16*  ?CALCIUM  --  9.0 9.0 8.8* 8.8* 8.9 9.2 9.0  ?PHOS 4.2 4.3  --   --   --   --   --   --   ? ?CBC ?Recent Labs  ?Lab 03/14/22 ?0143 03/15/22 ?0113 03/16/22 ?0101 03/17/22 ?0320  ?WBC 5.3 6.0 6.0 5.6  ?NEUTROABS 3.2 3.7 3.6 3.8  ?HGB 10.1* 10.2* 9.9* 10.5*  ?HCT 32.9* 32.5* 32.6* 33.9*  ?MCV 81.8 81.5 82.3 81.3  ?PLT 257 250 270 257  ? ? ?Medications:   ? ? amLODipine  10 mg Oral Daily  ? aspirin  81 mg Oral Daily  ? carvedilol  12.5 mg Oral BID WC  ? furosemide  40 mg Oral BID  ? heparin injection (subcutaneous)  5,000 Units Subcutaneous Q8H  ? hydrALAZINE  100 mg Oral TID  ? insulin aspart  0-5 Units  Subcutaneous QHS  ? insulin aspart  0-9 Units Subcutaneous TID WC  ? insulin glargine-yfgn  20 Units Subcutaneous Daily  ? isosorbide mononitrate  60 mg Oral Daily  ? rosuvastatin  10 mg Oral QHS  ? sodium chloride flush  3 mL Intravenous Q12H  ? tamsulosin  0.4 mg Oral QPC supper  ? ? ? ? ?Madelon Lips MD ?03/17/2022, 12:58 PM   ?

## 2022-03-18 ENCOUNTER — Telehealth: Payer: Self-pay

## 2022-03-18 NOTE — Telephone Encounter (Signed)
Transition Care Management Unsuccessful Follow-up Telephone Call ? ?Date of discharge and from where:  [facility]  hospital on 03/17/2022 ?Attempts:  1st Attempt ? ?Reason for unsuccessful TCM follow-up call:  Left voice message to schedule appt. Call back requested ? ?  ?

## 2022-03-18 NOTE — Addendum Note (Signed)
Addended by: Ma Hillock on: 03/18/2022 01:42 PM ? ? Modules accepted: Orders ? ?

## 2022-03-21 ENCOUNTER — Telehealth: Payer: Self-pay

## 2022-03-21 NOTE — Telephone Encounter (Signed)
Transition Care Management Follow-up Telephone Call ?Date of discharge and from where: 03/17/2022, [facility]  ?How have you been since you were released from the hospital? He said he is feeling okay.  ?Any questions or concerns? No ? ?Items Reviewed: ?Did the pt receive and understand the discharge instructions provided? Yes  ?Medications obtained and verified? Yes  - he said he has all medications as well as a glucometer. He explained that [facility] checks his blood sugars and administers his insulin. He didn't have any questions about the med regime ?Other? No  ?Any new allergies since your discharge? No  ?Dietary orders reviewed? Yes and he said he is adhering to 1.5 L/day Fluid restriction.  ?Do you have support at home? Yes  ? ?Home Care and Equipment/Supplies: ?Were home health services ordered? No - he refused ?If so, what is the name of the agency? N/a  ?Has the agency set up a time to come to the patient's home? not applicable ?Were any new equipment or medical supplies ordered?  No ?What is the name of the medical supply agency? N/a ?Were you able to get the supplies/equipment? not applicable ?Do you have any questions related to the use of the equipment or supplies? No ? ?Functional Questionnaire: (I = Independent and D = Dependent) ?ADLs: ambulating with a cane.  He said he is independent with personal care but [name] can assist if needed.  ? ? ?Follow up appointments reviewed: ? ?PCP Hospital f/u appt confirmed?  He said that [name] will cal to schedule an appointment.    ?[facility] f/u appt confirmed? Yes  Scheduled to see endocrinology - 03/31/2022. Needs follow up with nephrology and cardiology.  ?Are transportation arrangements needed? No  ?If their condition worsens, is the pt aware to call PCP or go to the Emergency Dept.? Yes ?Was the patient provided with contact information for the PCP's office or ED? Yes ?Was to pt encouraged to call back with questions or concerns? Yes ? ?

## 2022-03-25 ENCOUNTER — Other Ambulatory Visit: Payer: Self-pay

## 2022-03-25 ENCOUNTER — Other Ambulatory Visit: Payer: Self-pay | Admitting: Family Medicine

## 2022-03-25 DIAGNOSIS — E1121 Type 2 diabetes mellitus with diabetic nephropathy: Secondary | ICD-10-CM

## 2022-03-28 NOTE — Telephone Encounter (Signed)
Called pt to ask to schedule a visit. He was unavailable but his wife stated that he now goes to a new doctor at [facility]. ?

## 2022-03-29 ENCOUNTER — Other Ambulatory Visit: Payer: Self-pay

## 2022-03-29 MED ORDER — FREESTYLE LIBRE 2 SENSOR MISC
11 refills | Status: DC
  Filled 2022-03-29: qty 2, 28d supply, fill #0

## 2022-03-29 MED ORDER — FREESTYLE LIBRE 2 READER DEVI
0 refills | Status: DC
  Filled 2022-03-29: qty 1, 14d supply, fill #0

## 2022-03-31 ENCOUNTER — Ambulatory Visit: Payer: Medicare Other | Admitting: Internal Medicine

## 2022-03-31 ENCOUNTER — Observation Stay (HOSPITAL_COMMUNITY)
Admission: EM | Admit: 2022-03-31 | Discharge: 2022-04-01 | Disposition: A | Discharge status: Home / Self Care | Payer: Medicare Other | Attending: Internal Medicine | Admitting: Internal Medicine

## 2022-03-31 ENCOUNTER — Encounter (HOSPITAL_COMMUNITY): Payer: Self-pay | Admitting: Internal Medicine

## 2022-03-31 ENCOUNTER — Other Ambulatory Visit: Payer: Self-pay

## 2022-03-31 ENCOUNTER — Observation Stay (HOSPITAL_BASED_OUTPATIENT_CLINIC_OR_DEPARTMENT_OTHER): Payer: Medicare Other

## 2022-03-31 ENCOUNTER — Emergency Department (HOSPITAL_COMMUNITY): Payer: Medicare Other

## 2022-03-31 DIAGNOSIS — Z9181 History of falling: Secondary | ICD-10-CM | POA: Insufficient documentation

## 2022-03-31 DIAGNOSIS — E782 Mixed hyperlipidemia: Secondary | ICD-10-CM | POA: Diagnosis present

## 2022-03-31 DIAGNOSIS — E162 Hypoglycemia, unspecified: Secondary | ICD-10-CM

## 2022-03-31 DIAGNOSIS — I130 Hypertensive heart and chronic kidney disease with heart failure and stage 1 through stage 4 chronic kidney disease, or unspecified chronic kidney disease: Secondary | ICD-10-CM | POA: Insufficient documentation

## 2022-03-31 DIAGNOSIS — I252 Old myocardial infarction: Secondary | ICD-10-CM | POA: Diagnosis not present

## 2022-03-31 DIAGNOSIS — E11649 Type 2 diabetes mellitus with hypoglycemia without coma: Secondary | ICD-10-CM | POA: Diagnosis not present

## 2022-03-31 DIAGNOSIS — I2510 Atherosclerotic heart disease of native coronary artery without angina pectoris: Secondary | ICD-10-CM | POA: Diagnosis not present

## 2022-03-31 DIAGNOSIS — I5042 Chronic combined systolic (congestive) and diastolic (congestive) heart failure: Secondary | ICD-10-CM

## 2022-03-31 DIAGNOSIS — G9341 Metabolic encephalopathy: Secondary | ICD-10-CM | POA: Insufficient documentation

## 2022-03-31 DIAGNOSIS — N400 Enlarged prostate without lower urinary tract symptoms: Secondary | ICD-10-CM | POA: Diagnosis present

## 2022-03-31 DIAGNOSIS — Z794 Long term (current) use of insulin: Secondary | ICD-10-CM | POA: Diagnosis not present

## 2022-03-31 DIAGNOSIS — Z95810 Presence of automatic (implantable) cardiac defibrillator: Secondary | ICD-10-CM | POA: Insufficient documentation

## 2022-03-31 DIAGNOSIS — N184 Chronic kidney disease, stage 4 (severe): Secondary | ICD-10-CM | POA: Diagnosis not present

## 2022-03-31 DIAGNOSIS — Z7982 Long term (current) use of aspirin: Secondary | ICD-10-CM | POA: Insufficient documentation

## 2022-03-31 DIAGNOSIS — N1832 Chronic kidney disease, stage 3b: Secondary | ICD-10-CM

## 2022-03-31 DIAGNOSIS — R2681 Unsteadiness on feet: Secondary | ICD-10-CM | POA: Insufficient documentation

## 2022-03-31 DIAGNOSIS — N289 Disorder of kidney and ureter, unspecified: Secondary | ICD-10-CM

## 2022-03-31 DIAGNOSIS — E785 Hyperlipidemia, unspecified: Secondary | ICD-10-CM | POA: Diagnosis present

## 2022-03-31 DIAGNOSIS — R4182 Altered mental status, unspecified: Secondary | ICD-10-CM

## 2022-03-31 DIAGNOSIS — Z79899 Other long term (current) drug therapy: Secondary | ICD-10-CM | POA: Diagnosis not present

## 2022-03-31 DIAGNOSIS — D631 Anemia in chronic kidney disease: Secondary | ICD-10-CM | POA: Diagnosis not present

## 2022-03-31 DIAGNOSIS — I10 Essential (primary) hypertension: Secondary | ICD-10-CM

## 2022-03-31 DIAGNOSIS — E1122 Type 2 diabetes mellitus with diabetic chronic kidney disease: Secondary | ICD-10-CM | POA: Diagnosis not present

## 2022-03-31 DIAGNOSIS — E669 Obesity, unspecified: Secondary | ICD-10-CM | POA: Diagnosis present

## 2022-03-31 DIAGNOSIS — E1121 Type 2 diabetes mellitus with diabetic nephropathy: Secondary | ICD-10-CM

## 2022-03-31 LAB — BASIC METABOLIC PANEL
Anion gap: 8 (ref 5–15)
BUN: 37 mg/dL — ABNORMAL HIGH (ref 6–20)
CO2: 21 mmol/L — ABNORMAL LOW (ref 22–32)
Calcium: 9.1 mg/dL (ref 8.9–10.3)
Chloride: 111 mmol/L (ref 98–111)
Creatinine, Ser: 2.98 mg/dL — ABNORMAL HIGH (ref 0.61–1.24)
GFR, Estimated: 24 mL/min — ABNORMAL LOW (ref >60)
Glucose, Bld: 109 mg/dL — ABNORMAL HIGH (ref 70–99)
Potassium: 4 mmol/L (ref 3.5–5.1)
Sodium: 140 mmol/L (ref 135–145)

## 2022-03-31 LAB — URINALYSIS, ROUTINE W REFLEX MICROSCOPIC
Bilirubin Urine: NEGATIVE
Glucose, UA: NEGATIVE mg/dL
Ketones, ur: NEGATIVE mg/dL
Leukocytes,Ua: NEGATIVE
Nitrite: NEGATIVE
Protein, ur: 100 mg/dL — AB
Specific Gravity, Urine: 1.009 (ref 1.005–1.030)
pH: 5 (ref 5.0–8.0)

## 2022-03-31 LAB — SODIUM, URINE, RANDOM: Sodium, Ur: 96 mmol/L

## 2022-03-31 LAB — GLUCOSE, CAPILLARY
Glucose-Capillary: 115 mg/dL — ABNORMAL HIGH (ref 70–99)
Glucose-Capillary: 122 mg/dL — ABNORMAL HIGH (ref 70–99)
Glucose-Capillary: 124 mg/dL — ABNORMAL HIGH (ref 70–99)
Glucose-Capillary: 86 mg/dL (ref 70–99)
Glucose-Capillary: 98 mg/dL (ref 70–99)
Glucose-Capillary: 98 mg/dL (ref 70–99)

## 2022-03-31 LAB — PROTEIN, URINE, RANDOM: Total Protein, Urine: 173 mg/dL

## 2022-03-31 LAB — CBG MONITORING, ED
Glucose-Capillary: 147 mg/dL — ABNORMAL HIGH (ref 70–99)
Glucose-Capillary: 61 mg/dL — ABNORMAL LOW (ref 70–99)
Glucose-Capillary: 255 mg/dL — ABNORMAL HIGH (ref 70–99)
Glucose-Capillary: 167 mg/dL — ABNORMAL HIGH (ref 70–99)
Glucose-Capillary: 144 mg/dL — ABNORMAL HIGH (ref 70–99)
Glucose-Capillary: 93 mg/dL (ref 70–99)

## 2022-03-31 LAB — HEPATIC FUNCTION PANEL
ALT: 18 U/L (ref 0–44)
AST: 21 U/L (ref 15–41)
Albumin: 3.4 g/dL — ABNORMAL LOW (ref 3.5–5.0)
Alkaline Phosphatase: 62 U/L (ref 38–126)
Bilirubin, Direct: <0.1 mg/dL (ref 0.0–0.2)
Total Bilirubin: 0.6 mg/dL (ref 0.3–1.2)
Total Protein: 7.4 g/dL (ref 6.5–8.1)

## 2022-03-31 LAB — CBC WITH DIFFERENTIAL/PLATELET
Abs Immature Granulocytes: 0.01 10*3/uL (ref 0.00–0.07)
Basophils Absolute: 0 10*3/uL (ref 0.0–0.1)
Basophils Relative: 1 %
Eosinophils Absolute: 0.1 10*3/uL (ref 0.0–0.5)
Eosinophils Relative: 1 %
HCT: 37.3 % — ABNORMAL LOW (ref 39.0–52.0)
Hemoglobin: 11.2 g/dL — ABNORMAL LOW (ref 13.0–17.0)
Immature Granulocytes: 0 %
Lymphocytes Relative: 15 %
Lymphs Abs: 1.1 10*3/uL (ref 0.7–4.0)
MCH: 25 pg — ABNORMAL LOW (ref 26.0–34.0)
MCHC: 30 g/dL (ref 30.0–36.0)
MCV: 83.3 fL (ref 80.0–100.0)
Monocytes Absolute: 0.5 10*3/uL (ref 0.1–1.0)
Monocytes Relative: 7 %
Neutro Abs: 5.6 10*3/uL (ref 1.7–7.7)
Neutrophils Relative %: 76 %
Platelets: 276 10*3/uL (ref 150–400)
RBC: 4.48 MIL/uL (ref 4.22–5.81)
RDW: 15 % (ref 11.5–15.5)
WBC: 7.4 10*3/uL (ref 4.0–10.5)
nRBC: 0 % (ref 0.0–0.2)

## 2022-03-31 LAB — ECHOCARDIOGRAM COMPLETE
AR max vel: 4.27 cm2
AV Area VTI: 4.96 cm2
AV Area mean vel: 4.3 cm2
AV Mean grad: 4 mmHg
AV Peak grad: 6.5 mmHg
Ao pk vel: 1.27 m/s
Area-P 1/2: 3.93 cm2
Calc EF: 52.1 %
Height: 67 in
S' Lateral: 3.55 cm
Single Plane A2C EF: 41.4 %
Single Plane A4C EF: 61 %
Weight: 3440 oz

## 2022-03-31 LAB — BRAIN NATRIURETIC PEPTIDE: B Natriuretic Peptide: 16.3 pg/mL (ref 0.0–100.0)

## 2022-03-31 LAB — CREATININE, URINE, RANDOM: Creatinine, Urine: 77.11 mg/dL

## 2022-03-31 MED ORDER — ROSUVASTATIN CALCIUM 20 MG PO TABS
40.0000 mg | ORAL_TABLET | Freq: Every day | ORAL | Status: DC
  Administered 2022-03-31 – 2022-04-01 (×2): 40 mg via ORAL
  Filled 2022-03-31 (×2): qty 2

## 2022-03-31 MED ORDER — ACETAMINOPHEN 650 MG RE SUPP: 650.0000 mg | Freq: Four times a day (QID) | RECTAL | Status: DC | PRN

## 2022-03-31 MED ORDER — ASPIRIN EC 81 MG PO TBEC
81.0000 mg | DELAYED_RELEASE_TABLET | Freq: Every day | ORAL | Status: DC
  Administered 2022-04-01: 81 mg via ORAL
  Filled 2022-03-31: qty 1

## 2022-03-31 MED ORDER — TAMSULOSIN HCL 0.4 MG PO CAPS
0.4000 mg | ORAL_CAPSULE | Freq: Every day | ORAL | Status: DC
  Administered 2022-03-31: 0.4 mg via ORAL
  Filled 2022-03-31: qty 1

## 2022-03-31 MED ORDER — AMLODIPINE BESYLATE 10 MG PO TABS
10.0000 mg | ORAL_TABLET | Freq: Every day | ORAL | Status: DC
  Administered 2022-03-31 – 2022-04-01 (×2): 10 mg via ORAL
  Filled 2022-03-31: qty 2
  Filled 2022-03-31: qty 1

## 2022-03-31 MED ORDER — ISOSORBIDE MONONITRATE ER 30 MG PO TB24
30.0000 mg | ORAL_TABLET | Freq: Every day | ORAL | Status: DC
  Administered 2022-03-31 – 2022-04-01 (×2): 30 mg via ORAL
  Filled 2022-03-31 (×2): qty 1

## 2022-03-31 MED ORDER — DEXTROSE 50 % IV SOLN
INTRAVENOUS | Status: AC
  Administered 2022-03-31: 50 mL
  Filled 2022-03-31: qty 50

## 2022-03-31 MED ORDER — DEXTROSE 50 % IV SOLN
1.0000 | Freq: Once | INTRAVENOUS | Status: AC
  Administered 2022-03-31: 50 mL via INTRAVENOUS
  Filled 2022-03-31: qty 50

## 2022-03-31 MED ORDER — DEXTROSE 50 % IV SOLN
1.0000 | INTRAVENOUS | Status: DC | PRN
Start: 2022-03-31 — End: 2022-04-01

## 2022-03-31 MED ORDER — HYDRALAZINE HCL 50 MG PO TABS
100.0000 mg | ORAL_TABLET | Freq: Three times a day (TID) | ORAL | Status: DC
  Administered 2022-03-31 – 2022-04-01 (×4): 100 mg via ORAL
  Filled 2022-03-31 (×4): qty 2

## 2022-03-31 MED ORDER — HYDRALAZINE HCL 20 MG/ML IJ SOLN
10.0000 mg | INTRAMUSCULAR | Status: DC | PRN
Start: 2022-03-31 — End: 2022-04-01

## 2022-03-31 MED ORDER — SODIUM CHLORIDE 0.9% FLUSH
3.0000 mL | Freq: Two times a day (BID) | INTRAVENOUS | Status: DC
  Administered 2022-03-31 – 2022-04-01 (×3): 3 mL via INTRAVENOUS

## 2022-03-31 MED ORDER — CARVEDILOL 12.5 MG PO TABS
12.5000 mg | ORAL_TABLET | Freq: Two times a day (BID) | ORAL | Status: DC
  Administered 2022-03-31 – 2022-04-01 (×3): 12.5 mg via ORAL
  Filled 2022-03-31 (×3): qty 1

## 2022-03-31 MED ORDER — ALBUTEROL SULFATE (2.5 MG/3ML) 0.083% IN NEBU: 2.5000 mg | INHALATION_SOLUTION | Freq: Four times a day (QID) | RESPIRATORY_TRACT | Status: DC | PRN

## 2022-03-31 MED ORDER — GABAPENTIN 300 MG PO CAPS
300.0000 mg | ORAL_CAPSULE | Freq: Every day | ORAL | Status: DC
  Administered 2022-03-31: 300 mg via ORAL
  Filled 2022-03-31: qty 1

## 2022-03-31 MED ORDER — DEXTROSE 5 % IV SOLN: INTRAVENOUS | Status: DC

## 2022-03-31 MED ORDER — ACETAMINOPHEN 325 MG PO TABS: 650.0000 mg | ORAL_TABLET | Freq: Four times a day (QID) | ORAL | Status: DC | PRN

## 2022-03-31 MED ORDER — HEPARIN SODIUM (PORCINE) 5000 UNIT/ML IJ SOLN
5000.0000 [IU] | Freq: Three times a day (TID) | INTRAMUSCULAR | Status: DC
  Administered 2022-03-31 – 2022-04-01 (×3): 5000 [IU] via SUBCUTANEOUS
  Filled 2022-03-31 (×3): qty 1

## 2022-03-31 NOTE — Plan of Care (Signed)
?  Problem: Education: ?Goal: Knowledge of [place] [facility] Education information/materials will improve ?Outcome: Progressing ?Goal: Emotional status will improve ?Outcome: Progressing ?  ?Problem: Activity: ?Goal: Interest or engagement in activities will improve ?Outcome: Progressing ?  ?

## 2022-03-31 NOTE — Progress Notes (Signed)
Inpatient Diabetes Program Recommendations ? ?AACE/ADA: New Consensus Statement on Inpatient Glycemic Control (2015) ? ?Target Ranges:  Prepandial:   less than 140 mg/dL ?     Peak postprandial:   less than 180 mg/dL (1-2 hours) ?     Critically ill patients:  140 - 180 mg/dL  ? ?Lab Results  ?Component Value Date  ? GLUCAP 93 03/31/2022  ? HGBA1C 9.7 (H) 03/11/2022  ? ? ?Review of Glycemic Control ? ?Diabetes history: type 2 ?Outpatient Diabetes medications: Lantus 25 units at HS, Novolog SENSITIVE correction scale TID ?Current orders for Inpatient glycemic control: none yet ? ?Inpatient Diabetes Program Recommendations:   ?Patient in ED with hypoglycemia.  ? ?Recommend starting Novolog SENSITIVE correction scale TID and Novolog 0-5 units HS scale. Will need to add basal insulin when patient is eating consistently.  ? ?Will continue to monitor blood sugars while in the hospital. ? ?Harvel Ricks RN BSN CDE ?Diabetes Coordinator ?Pager: [phone]  8am-5pm  ? ? ? ?

## 2022-03-31 NOTE — H&P (Addendum)
?History and Physical  ? ? ?Patient: Jeffrey [name] MRN:[id number] DOB: 02/01/1966 ?DOA: 03/31/2022 ?DOS: the patient was seen and examined on 03/31/2022 ?PCP: Newlin, Enobong, MD  ?Patient coming from: Home ? ?Chief Complaint:  ?Chief Complaint  ?Patient presents with  ? Hypoglycemia  ? ?HPI: [name] is a 56 y.o. 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  Patient unable to give history and is obtained from review of records and talks with the patient's wife over the phone.  Patient had just recently been hospitalized from 3/30-4/6 after presenting with weakness and confusion secondary found to be hypoglycemic. At home patient has been doing okay, but  reports that he had been having some swelling of his hands and feet his blood sugars were good up until the last 2 days where they intermittently were in the 70-80s.  At home she reports that they have been trying to decrease his fluid intake and decrease how much he eats with his meals.  She reports that he still weak, wobbly, and just wanting to sleep a lot.  This morning she heard him making many noises and sweating profusely for which she checked his blood sugar and found it to be 47.  She tried to get him to drink some juice, but he was not responding and jerked off the bed onto the floor.  Notes patient only checked once falling off the bed, and was not continuous. States that he did not hit his head for which she can remember, but she was unable to get him up and called EMS.  She does report that the patient sometimes will sneaking to the bathroom and trying to give himself insulin despite him having poor vision.  She is unsure of why he does this and does not know if this happened last night.  He was last given Lantus 25 units yesterday evening. ? ?Upon EMS arrival  patient's blood sugar was reportedly 49.  Wife notes they had some trouble establishing IV.  Patient given a bag of D10 IV fluid and blood sugar was 209. ? ?Plan admission into the emergency department patient was noted to be afebrile with respirations 14-23, blood pressure 127/84- 167/81 saturations maintained on room air.  Chest x-ray noted cardiomegaly with pulmonary venous congestion without frank pulmonary edema.  CT scan of the brain did not note any acute abnormality.  Patient had been given amp of D50 while in the emergency department after blood sugars were noted to be 61 and then started on D5 IV fluids at 100 mL/h. ? ? ?Review of Systems: As mentioned in the history of present illness. All other systems reviewed and are negative. ?Past Medical History:  ?Diagnosis Date  ? AICD (automatic cardioverter/defibrillator) present   ? Medtronic  ? AICD (automatic cardioverter/defibrillator) present   ? MDT Visia AF MRI  ? Anemia   ? CAD (coronary artery disease)   ? a. cath 01/31/17: 60% 1st RPLB, 60% dist RCA, 55% prox RCA, 10% pro LAD --> Rx TX.   ? CHF (congestive heart failure) (HCC)   ? Chronic systolic CHF (congestive heart failure) (HCC) 01/28/2017  ? 1. Echo 01/29/17:  EF 20-25, normal wall motion, mild LAE // 2. EF 10-15 by LHC 01/2017   ? Coronary artery disease   ? Diabetes mellitus   ? type II  ?   Diabetes mellitus without complication (HCC)   ? Diabetic foot infection (HCC) 03/2016  ? RT FOOT  ? Dyspnea   ? History of kidney stones   ? passed  ? History of kidney stones   ? HTN (hypertension)   ? Hyperlipidemia   ? Hypertension   ? Myocardial infarction (HCC), although reported, NICM at cath   ? NICM (nonischemic cardiomyopathy) (HCC) 02/15/2017  ? 1. Mod non-obs CAD on LHC in 01/2017 - CAD does not explain cardiomyopathy  ? Renal disorder   ? ?Past Surgical History:  ?Procedure Laterality Date  ? AIR/FLUID EXCHANGE Right 07/14/2020  ? Procedure: AIR/FLUID EXCHANGE;  Surgeon: Patel, Narendra, MD;  Location:  [facility] OR;  Service: Ophthalmology;  Laterality: Right;  ? AMPUTATION Right 04/01/2016  ? Procedure: Right Great Toe Amputation;  Surgeon: Marcus Duda V, MD;  Location: [facility] OR;  Service: Orthopedics;  Laterality: Right;  ? AMPUTATION Right 06/19/2016  ? Procedure: AMPUTATION SECOND TOE;  Surgeon: Mark C Yates, MD;  Location: [facility] OR;  Service: Orthopedics;  Laterality: Right;  ? BACK SURGERY    ? for abscess  ? CARDIAC CATHETERIZATION    ? ICD IMPLANT N/A 01/15/2018  ? Procedure: ICD IMPLANT;  Surgeon: Klein, Steven C, MD;  Location: [facility] INVASIVE CV LAB;  Service: Cardiovascular;  Laterality: N/A;  ? INJECTION OF SILICONE OIL Right 07/14/2020  ? Procedure: INJECTION OF SILICONE OIL;  Surgeon: Patel, Narendra, MD;  Location: [facility] OR;  Service: Ophthalmology;  Laterality: Right;  ? INJECTION OF SILICONE OIL Right 08/25/2020  ? Procedure: INJECTION OF SILICONE OIL;  Surgeon: Patel, Narendra, MD;  Location: [facility] OR;  Service: Ophthalmology;  Laterality: Right;  ? INSERT / REPLACE / REMOVE PACEMAKER    ? MEMBRANE PEEL Right 08/25/2020  ? Procedure: MEMBRANE PEEL;  Surgeon: Patel, Narendra, MD;  Location: [facility] OR;  Service: Ophthalmology;  Laterality: Right;  ? PARS PLANA VITRECTOMY Right 07/14/2020  ? Procedure: PARS PLANA VITRECTOMY WITH 25 GAUGE, Membranetomy, drainage of subretinal fluid;  Surgeon: Patel, Narendra, MD;  Location: [facility];  Service: Ophthalmology;  Laterality: Right;  ? PARS PLANA VITRECTOMY Right 08/25/2020  ? Procedure: PARS PLANA VITRECTOMY WITH 25 GAUGE;  Surgeon: Patel, Narendra, MD;  Location: [facility];  Service: Ophthalmology;  Laterality: Right;  ? PHOTOCOAGULATION WITH LASER Right 07/14/2020  ? Procedure: PHOTOCOAGULATION WITH LASER;  Surgeon: Patel, Narendra, MD;  Location: [facility];  Service: Ophthalmology;  Laterality: Right;  ? PHOTOCOAGULATION WITH LASER Right 08/25/2020  ? Procedure: PHOTOCOAGULATION WITH LASER;  Surgeon: Patel, Narendra, MD;  Location: [facility];  Service: Ophthalmology;  Laterality: Right;  ? REPAIR OF  COMPLEX TRACTION RETINAL DETACHMENT Right 08/25/2020  ? Procedure: REPAIR OF HEMORRHAGIC DETACHMENT;  Surgeon: Patel, Narendra, MD;  Location: [facility];  Service: Ophthalmology;  Laterality: Right;  ? RIGHT/LEFT HEART CATH AND CORONARY ANGIOGRAPHY N/A 01/31/2017  ? Procedure: Right/Left Heart Cath and Coronary Angiography;  Surgeon: Thomas A Kelly, MD;  Location: [facility];  Service: Cardiovascular;  Laterality: N/A;  ? SILICON OIL REMOVAL Right 08/25/2020  ? Procedure: SILICON OIL REMOVAL;  Surgeon: Patel, Narendra, MD;  Location: [facility];  Service: Ophthalmology;  Laterality: Right;  ? ?Social History:  reports that he has never smoked. He has never used smokeless tobacco. He reports that he does not drink alcohol and does not use drugs. ? ?No Known Allergies ? ?Family History  ?Problem Relation Age of Onset  ? Diabetes Mother   ? Hypertension Mother   ? Diabetes Father   ?   Heart attack Father   ? Diabetes Sister   ? Heart attack Maternal Grandmother   ? ? ?Prior to Admission medications   ?Medication Sig Start Date End Date Taking? Authorizing Provider  ?Accu-Chek Softclix Lancets lancets Use to check blood sugar three times daily E11.65 08/12/21   Charlott Rakes, MD  ?albuterol (VENTOLIN HFA) 108 (90 Base) MCG/ACT inhaler Inhale 1 to 2 puffs by mouth into the lungs every 6 hours as needed for wheezing or shortness of breath. 10/07/21   Argentina Donovan, PA-C  ?amLODipine (NORVASC) 10 MG tablet Take 1 tablet (10 mg total) by mouth daily. 03/17/22   Thurnell Lose, MD  ?aspirin 81 MG EC tablet Take 1 tablet (81 mg total) by mouth daily. Swallow whole. 07/03/21   Mariel Aloe, MD  ?Blood Glucose Monitoring Suppl (ACCU-CHEK GUIDE) w/Device KIT Use to check blood sugar three times daily E11.65 08/12/21   Charlott Rakes, MD  ?carvedilol (COREG) 12.5 MG tablet TAKE 1 TABLET (12.5 MG TOTAL) BY MOUTH 2 (TWO) TIMES DAILY WITH A MEAL. 10/20/21   Charlott Rakes, MD  ?cetirizine (ZYRTEC) 10 MG tablet TAKE 1 TABLET (10 MG  TOTAL) BY MOUTH DAILY. ?Patient taking differently: Take 10 mg by mouth daily as needed for allergies. 07/27/20   Charlott Rakes, MD  ?Continuous Blood Gluc Receiver (FREESTYLE LIBRE 2 READER) DEVI Use as directed

## 2022-03-31 NOTE — ED Provider Notes (Signed)
Discussed with hospitalist ?Dr. Tamala Julian to admit ?  ?Jeffrey Chapel, MD ?03/31/22 [phone] ? ?

## 2022-03-31 NOTE — ED Triage Notes (Signed)
Pt at home unresponsive. Wife called ems blood sugar 49. Ems gave d10 and blood sugar 209. Pt arrived to hospital responsive to voice but lethargic blood sugar 61 ?

## 2022-03-31 NOTE — Progress Notes (Signed)
?  Transition of Care (TOC) Screening Note ? ? ?Patient Details  ?Name: Jeffrey [name] ?Date of Birth: 14-Feb-1966 ? ? ?Transition of Care (TOC) CM/SW Contact:    ?Cyndi Bender, RN ?Phone Number: ?03/31/2022, 4:14 PM ? ? ? ?Transition of Care Department [facility]) has reviewed patient and no TOC needs have been identified at this time. We will continue to monitor patient advancement through interdisciplinary progression rounds. If new patient transition needs arise, please place a TOC consult. ? ? ?

## 2022-03-31 NOTE — ED Provider Notes (Signed)
?[facility] ?Provider Note ? ? ?CSN: [id number] ?Arrival date & time: 03/31/22  0545 ? ?  ? ?History ? ?Chief Complaint  ?Patient presents with  ? Hypoglycemia  ? ? ?Jeffrey [name] is a 56 y.o. male. ? ?The history is provided by the EMS personnel. The history is limited by the condition of the patient (Altered mental status).  ?Hypoglycemia ?He has history of hypertension, diabetes, hyperlipidemia, coronary artery disease, systolic heart failure, chronic kidney disease and comes in by ambulance because of hypoglycemia.  EMS reports initial blood glucose of 49 and he was given an amp of D50 with improvement in glucose to 209.  Patient does not remember if he ate dinner or ate a snack. ?  ?Home Medications ?Prior to Admission medications   ?Medication Sig Start Date End Date Taking? Authorizing Provider  ?Accu-Chek Softclix Lancets lancets Use to check blood sugar three times daily E11.65 08/12/21   Charlott Rakes, MD  ?albuterol (VENTOLIN HFA) 108 (90 Base) MCG/ACT inhaler Inhale 1 to 2 puffs by mouth into the lungs every 6 hours as needed for wheezing or shortness of breath. 10/07/21   Argentina Donovan, PA-C  ?amLODipine (NORVASC) 10 MG tablet Take 1 tablet (10 mg total) by mouth daily. 03/17/22   Thurnell Lose, MD  ?aspirin 81 MG EC tablet Take 1 tablet (81 mg total) by mouth daily. Swallow whole. 07/03/21   Mariel Aloe, MD  ?Blood Glucose Monitoring Suppl (ACCU-CHEK GUIDE) w/Device KIT Use to check blood sugar three times daily E11.65 08/12/21   Charlott Rakes, MD  ?carvedilol (COREG) 12.5 MG tablet TAKE 1 TABLET (12.5 MG TOTAL) BY MOUTH 2 (TWO) TIMES DAILY WITH A MEAL. 10/20/21   Charlott Rakes, MD  ?cetirizine (ZYRTEC) 10 MG tablet TAKE 1 TABLET (10 MG TOTAL) BY MOUTH DAILY. ?Patient taking differently: Take 10 mg by mouth daily as needed for allergies. 07/27/20   Charlott Rakes, MD  ?Continuous Blood Gluc Receiver (FREESTYLE LIBRE 2 READER) DEVI Use as directed four  to five times daily. 03/29/22     ?Continuous Blood Gluc Sensor (FREESTYLE LIBRE 2 SENSOR) MISC Use as directed, to montior blood sugar 4 to 5 times a day daily. 03/29/22     ?Elastic Bandages & Supports (T.E.D. KNEE LENGTH/S-LONG) MISC 1 (one) each daily, apply compression stockings daily to help decrease leg swelling 11/11/21     ?ferrous sulfate 325 (65 FE) MG tablet Take 1 tablet (325 mg total) by mouth 2 (two) times daily with a meal. 04/03/16   Dhungel, Nishant, MD  ?furosemide (LASIX) 40 MG tablet Take 1 tablet (40 mg total) by mouth 2 (two) times daily. 03/17/22 03/17/23  Thurnell Lose, MD  ?gabapentin (NEURONTIN) 300 MG capsule Take 1 capsule (300 mg total) by mouth at bedtime. 03/17/22 03/17/23  Thurnell Lose, MD  ?glucose blood (ACCU-CHEK GUIDE) test strip Use to check blood sugar three times daily E11.65 08/12/21   Charlott Rakes, MD  ?hydrALAZINE (APRESOLINE) 100 MG tablet Take 1 tablet (100 mg total) by mouth 3 (three) times daily. 03/17/22 04/16/22  Thurnell Lose, MD  ?insulin aspart (NOVOLOG FLEXPEN) 100 UNIT/ML FlexPen Inject 0-9 Units into the skin See admin instructions. Inject 0-9 units into the skin 3 times a day with meals, PER SLIDING SCALE:  ?CBG 70 - 120 =  0 unit ?CBG 121 - 150: 1 unit  ?CBG 151 - 200: 2 units  ?CBG 201 - 250: 3 units  ?CBG 251 -  300: 5 units  ?CBG 301 - 350: 7 units  ?CBG 351 - 400 9 units 02/24/22   Newlin, Enobong, MD  ?Insulin Pen Needle (BD PEN NEEDLE NANO U/F) 32G X 4 MM MISC USE TO INJECT LANTUS DAILY. MUST USE NEW PEN NEEDLE WITH EACH INJECTION. 10/07/21   Argentina Donovan, PA-C  ?isosorbide mononitrate (IMDUR) 30 MG 24 hr tablet Take 1 tablet (30 mg total) by mouth daily. 10/20/21   Charlott Rakes, MD  ?LANTUS SOLOSTAR 100 UNIT/ML Solostar Pen Inject 25 Units into the skin daily. 03/18/22   Thurnell Lose, MD  ?magnesium oxide (MAG-OX) 400 MG tablet Take 1 tablet (400 mg total) by mouth daily. 08/07/21   Shelly Coss, MD  ?ondansetron (ZOFRAN) 4 MG tablet Take 1  tablet (4 mg total) by mouth every 6 (six) hours as needed for nausea or vomiting. 50/53/97   Delora Fuel, MD  ?potassium chloride SA (KLOR-CON M) 20 MEQ tablet Take 1 tablet (20 mEq total) by mouth 2 (two) times daily. 03/17/22   Thurnell Lose, MD  ?rosuvastatin (CRESTOR) 40 MG tablet TAKE 1 TABLET (40 MG TOTAL) BY MOUTH DAILY. 10/20/21 10/20/22  Charlott Rakes, MD  ?tamsulosin (FLOMAX) 0.4 MG CAPS capsule Take 1 capsule by mouth nightly, Stop if it causes dizziness 03/17/22   Thurnell Lose, MD  ?Vitamin D, Ergocalciferol, (DRISDOL) 1.25 MG (50000 UNIT) CAPS capsule Take 1 capsule by mouth weekly. 03/07/22   Claudia Desanctis, MD  ?   ? ?Allergies    ?Patient has no known allergies.   ? ?Review of Systems   ?Review of Systems  ?Unable to perform ROS: Mental status change  ? ?Physical Exam ?Updated Vital Signs ?BP 127/84   Pulse 65   Temp (!) 97.4 ?F (36.3 ?C)   Resp 14   Ht '5\' 7"'  (1.702 m)   Wt 97.5 kg   SpO2 94%   BMI 33.67 kg/m?  ?Physical Exam ?Vitals and nursing note reviewed.  ?56 year old male, resting comfortably and in no acute distress. Vital signs are normal. Oxygen saturation is 94%, which is normal. ?Head is normocephalic and atraumatic. PERRLA, EOMI. Oropharynx is clear. ?Neck is nontender and supple without adenopathy or JVD. ?Back is nontender and there is no CVA tenderness. ?Lungs are clear without rales, wheezes, or rhonchi. ?Chest is nontender. ?Heart has regular rate and rhythm without murmur. ?Abdomen is soft, flat, nontender.  Small umbilical hernia present, easily reducible. ?Extremities have no cyanosis or edema, full range of motion is present. ?Skin is warm and dry without rash. ?Neurologic: Somnolent but is arousable to voice, oriented to person and place but not able to assess orientation to time.  Cranial nerves are grossly intact.  He does move all extremities equally. ? ?ED Results / Procedures / Treatments   ?Labs ?(all labs ordered are listed, but only abnormal results are  displayed) ?Labs Reviewed  ?CBG MONITORING, ED - Abnormal; Notable for the following components:  ?    Result Value  ? Glucose-Capillary 61 (*)   ? All other components within normal limits  ?CBG MONITORING, ED - Abnormal; Notable for the following components:  ? Glucose-Capillary 147 (*)   ? All other components within normal limits  ?BASIC METABOLIC PANEL  ?CBC WITH DIFFERENTIAL/PLATELET  ?URINALYSIS, ROUTINE W REFLEX MICROSCOPIC  ? ? ?EKG ?EKG Interpretation ? ?Date/Time:  Thursday March 31 2022 06:00:48 EDT ?Ventricular Rate:  67 ?PR Interval:    ?QRS Duration: 88 ?QT Interval:  451 ?  QTC Calculation: 477 ?R Axis:   177 ?Text Interpretation: Sinus rhythm Abnormal T, consider ischemia, lateral leads ST elevation, consider anterolateral injury Artifact in lead(s) I II III aVR aVL aVF V1 V2 V3 V4 When compared with ECG of 03/11/2022, No significant change was found Confirmed by Delora Fuel ([id number]) on 03/31/2022 6:13:55 AM ? ?Radiology ?CT Head Wo Contrast ? ?Result Date: 03/31/2022 ?CLINICAL DATA:  Mental status change with unknown cause EXAM: CT HEAD WITHOUT CONTRAST TECHNIQUE: Contiguous axial images were obtained from the base of the skull through the vertex without intravenous contrast. RADIATION DOSE REDUCTION: This exam was performed according to the departmental dose-optimization program which includes automated exposure control, adjustment of the mA and/or kV according to patient size and/or use of iterative reconstruction technique. COMPARISON:  Sixteen days ago FINDINGS: Brain: No evidence of acute infarction, hemorrhage, hydrocephalus, extra-axial collection or mass lesion/mass effect. Low-density in the cerebral white matter attributed to chronic small vessel ischemia given medical history. Chronic lacunar infarcts at the bilateral basal ganglia/deep white matter. Generalized mild cortical volume loss. Vascular: No hyperdense vessel or unexpected calcification. Skull: Normal. Negative for fracture or focal  lesion. Sinuses/Orbits: Oil tamponade to the right globe.  No acute finding IMPRESSION: 1. No acute finding. 2. Chronic small vessel ischemia. Electronically Signed   By: Jorje Guild M.D.   On: 03/31/2022 07:

## 2022-03-31 NOTE — Progress Notes (Signed)
Inpatient Diabetes Program Recommendations ? ?AACE/ADA: New Consensus Statement on Inpatient Glycemic Control (2015) ? ?Target Ranges:  Prepandial:   less than 140 mg/dL ?     Peak postprandial:   less than 180 mg/dL (1-2 hours) ?     Critically ill patients:  140 - 180 mg/dL  ? ?Lab Results  ?Component Value Date  ? GLUCAP 144 (H) 03/31/2022  ? HGBA1C 9.7 (H) 03/11/2022  ? ? ?Review of Glycemic Control ? ?Diabetes history: type 2? ?Outpatient Diabetes medications: Lantus 25 units at HS, Novolog SENSITIVE correction scale TID ?Current orders for Inpatient glycemic control: none yet ? ?Inpatient Diabetes Program Recommendations:   ?Attempted to talk with patient in the ED. He called his spouse instead of talking with me. When talking with [name] (wife) on the phone, she said that he had an appointment with Dr. Renne Crigler today, but she had called to cancel since he was in hospital. She states that he has been taking Lantus 25 units at HS at home, but sometimes takes more. He does take Novolog per sliding scale with each meal. Will follow up on patient after admission.  ? ?Harvel Ricks RN BSN CDE ?Diabetes Coordinator ?Pager: [phone]  8am-5pm  ? ?

## 2022-03-31 NOTE — ED Notes (Signed)
Pt sleeping at this time.

## 2022-03-31 NOTE — Progress Notes (Deleted)
Patient ID: Jeffrey [name], male   DOB: Aug 23, 1966, 56 y.o.   MRN: [id number]  This visit occurred during the SARS-CoV-2 public health emergency.  Safety protocols were in place, including screening questions prior to the visit, additional usage of staff PPE, and extensive cleaning of exam room while observing appropriate contact time as indicated for disinfecting solutions.   HPI: Jeffrey [name] is a 56 y.o.-year-old male, referred by his PCP, Charlott Rakes, MD, for management of DM2, dx in ***, insulin-dependent, uncontrolled, without long-term complications.  Reviewed HbA1c: Lab Results  Component Value Date   HGBA1C 9.7 (H) 03/11/2022   HGBA1C 7.6 (A) 10/20/2021   HGBA1C 14.2 (H) 06/30/2021   HGBA1C 10.7 (A) 01/13/2021   HGBA1C 15.4 (H) 06/13/2020   HGBA1C 9.9 (H) 04/25/2019   HGBA1C 7.2 (A) 10/24/2018   HGBA1C >15.5 (H) 06/22/2018   HGBA1C 13.2 03/21/2018   HGBA1C 13.8 12/20/2017   Pt is on a regimen of: - Lantus *** units at bedtime - Novolog *** units 3x a day, before meals  Pt checks his sugars *** a day and they are: - am: n/c - 2h after b'fast: n/c - before lunch: n/c - 2h after lunch: n/c - before dinner: n/c - 2h after dinner: n/c - bedtime: n/c - nighttime: n/c Lowest sugar was ***; he has hypoglycemia awareness at 70.  Highest sugar was ***.  Glucometer:***  Pt's meals are: - Breakfast: - Lunch: - Dinner: - Snacks:  - no CKD, last BUN/creatinine:  Lab Results  Component Value Date   BUN 37 (H) 03/31/2022   BUN 52 (H) 03/17/2022   CREATININE 2.98 (H) 03/31/2022   CREATININE 3.16 (H) 03/17/2022    -+ Mixed hyperlipidemia; latest set of lipids: Lab Results  Component Value Date   CHOL 201 (H) 07/02/2021   HDL 36 (L) 07/02/2021   LDLCALC 120 (H) 07/02/2021   TRIG 225 (H) 07/02/2021   CHOLHDL 5.6 07/02/2021  He is on Crestor 40 mg daily.  - last eye exam was in ***. No DR.   - no numbness and tingling in his feet.  Latest foot exam was  on 10/20/2021.  Pt has FH of DM in ***.  ROS: Constitutional: no weight gain, no weight loss, no fatigue, no subjective hyperthermia, no subjective hypothermia, no nocturia Eyes: no blurry vision, no xerophthalmia ENT: no sore throat, no nodules palpated in neck, no dysphagia, no odynophagia, no hoarseness, no tinnitus, no hypoacusis Cardiovascular: no CP, no SOB, no palpitations, no leg swelling Respiratory: no cough, no SOB, no wheezing Gastrointestinal: no N, no V, no D, no C, no acid reflux Musculoskeletal: no muscle, no joint aches Skin: no rash, no hair loss Neurological: no tremors, no numbness or tingling/no dizziness/no HAs Psychiatric: no depression, no anxiety  PE: There were no vitals taken for this visit. Wt Readings from Last 3 Encounters:  03/31/22 215 lb (97.5 kg)  03/10/22 214 lb 8.1 oz (97.3 kg)  03/09/22 222 lb 3.2 oz (100.8 kg)   Constitutional: overweight, in NAD Eyes: PERRLA, EOMI, no exophthalmos ENT: moist mucous membranes, no thyromegaly, no cervical lymphadenopathy Cardiovascular: RRR, No MRG Respiratory: CTA B Musculoskeletal: no deformities, strength intact in all 4 Skin: moist, warm, no rashes Neurological: no tremor with outstretched hands, DTR normal in all 4  ASSESSMENT: 1. DM2, insulin-dependent, uncontrolled, with complications - CAD - Diastolic and systolic CHF - CVAs - CKD stage 3  - PN - Recurring hypoglycemia -admitted with hypoglycemia induced encephalopathy 03/10/2022.  At that time he had hypoglycemia at 40 and was unresponsive.  PLAN:  1. Patient with long-standing, uncontrolled diabetes, on oral antidiabetic regimen, which became insufficient. - I suggested to:  There are no Patient Instructions on file for this visit. - Strongly advised him to start checking sugars at different times of the day - check 1x a day, rotating checks - discussed about CBG targets for treatment: 80-130 mg/dL before meals and <180 mg/dL after meals;  target HbA1c <7%. - given sugar log and advised how to fill it and to bring it at next appt  - given foot care handout and explained the principles  - given instructions for hypoglycemia management "15-15 rule"  - advised for yearly eye exams  - Return to clinic in 3 mo with sugar log   Jeffrey Kingdom, MD PhD [facility]

## 2022-04-01 DIAGNOSIS — D631 Anemia in chronic kidney disease: Secondary | ICD-10-CM | POA: Diagnosis not present

## 2022-04-01 DIAGNOSIS — N189 Chronic kidney disease, unspecified: Secondary | ICD-10-CM | POA: Diagnosis not present

## 2022-04-01 DIAGNOSIS — E11649 Type 2 diabetes mellitus with hypoglycemia without coma: Secondary | ICD-10-CM | POA: Diagnosis not present

## 2022-04-01 DIAGNOSIS — G9341 Metabolic encephalopathy: Secondary | ICD-10-CM | POA: Diagnosis not present

## 2022-04-01 LAB — BASIC METABOLIC PANEL
Anion gap: 7 (ref 5–15)
BUN: 42 mg/dL — ABNORMAL HIGH (ref 6–20)
CO2: 21 mmol/L — ABNORMAL LOW (ref 22–32)
Calcium: 9 mg/dL (ref 8.9–10.3)
Chloride: 111 mmol/L (ref 98–111)
Creatinine, Ser: 3.29 mg/dL — ABNORMAL HIGH (ref 0.61–1.24)
GFR, Estimated: 21 mL/min — ABNORMAL LOW (ref >60)
Glucose, Bld: 124 mg/dL — ABNORMAL HIGH (ref 70–99)
Potassium: 4.3 mmol/L (ref 3.5–5.1)
Sodium: 139 mmol/L (ref 135–145)

## 2022-04-01 LAB — CBC
HCT: 34.3 % — ABNORMAL LOW (ref 39.0–52.0)
Hemoglobin: 10.5 g/dL — ABNORMAL LOW (ref 13.0–17.0)
MCH: 25.2 pg — ABNORMAL LOW (ref 26.0–34.0)
MCHC: 30.6 g/dL (ref 30.0–36.0)
MCV: 82.5 fL (ref 80.0–100.0)
Platelets: 251 10*3/uL (ref 150–400)
RBC: 4.16 MIL/uL — ABNORMAL LOW (ref 4.22–5.81)
RDW: 15.1 % (ref 11.5–15.5)
WBC: 5.4 10*3/uL (ref 4.0–10.5)
nRBC: 0 % (ref 0.0–0.2)

## 2022-04-01 LAB — GLUCOSE, CAPILLARY
Glucose-Capillary: 133 mg/dL — ABNORMAL HIGH (ref 70–99)
Glucose-Capillary: 173 mg/dL — ABNORMAL HIGH (ref 70–99)
Glucose-Capillary: 153 mg/dL — ABNORMAL HIGH (ref 70–99)
Glucose-Capillary: 158 mg/dL — ABNORMAL HIGH (ref 70–99)

## 2022-04-01 MED ORDER — LANTUS SOLOSTAR 100 UNIT/ML ~~LOC~~ SOPN: 20.0000 [IU] | PEN_INJECTOR | Freq: Every day | SUBCUTANEOUS | 6 refills | Status: DC

## 2022-04-01 NOTE — Evaluation (Signed)
Occupational Therapy Evaluation ?Patient Details ?Name: Jeffrey [name] ?MRN: [id number] ?DOB: Dec 02, 1966 ?Today's Date: 04/01/2022 ? ? ?History of Present Illness 56 yo male presenting to ED on 4/20 after being found unresponsive. Found to be hypolipemic. Recent hospitalization ntly been hospitalized from 3/30-4/6 for hypoglycemia. PMH including anemia, CAD, DM, HTN, MI, R toe amputation, pacemaker, and back sx.  ? ?Clinical Impression ?  ?PTA, pt reports he was living with his sister and performs BADLs; uses [facility] for mobility. Pt reporting that he has a friend who is a CNA who helps with his medications, however, he has been managing his insulin. Pt presenting with decreased balance and cognition. Pt with decreased problem solving, awareness, attention, and processing. Pt would benefit from further acute OT to assess cognition and facilitate safe dc. Recommend dc to home once medically stable per physician. Would benefit from [facility] RN and CNA for consistent assistance with medication management.     ? ?Recommendations for follow up therapy are one component of a multi-disciplinary discharge planning process, led by the attending physician.  Recommendations may be updated based on patient status, additional functional criteria and insurance authorization.  ? ?Follow Up Recommendations ? No OT follow up [facility] RN and [place] aid)  ?  ?Assistance Recommended at Discharge Intermittent Supervision/Assistance  ?Patient can return home with the following Assistance with cooking/housework;Direct supervision/assist for medications management;Assist for transportation;Direct supervision/assist for financial management (Someone to assist with medication management and insulin management) ? ?  ?Functional Status Assessment ? Patient has had a recent decline in their functional status and demonstrates the ability to make significant improvements in function in a reasonable and predictable amount of time.  ?Equipment Recommendations ?  Tub/shower seat  ?  ?Recommendations for Other Services   ? ? ?  ?Precautions / Restrictions Precautions ?Precautions: Fall  ? ?  ? ?Mobility Bed Mobility ?Overal bed mobility: Needs Assistance ?Bed Mobility: Supine to Sit ?  ?  ?Supine to sit: Supervision ?  ?  ?General bed mobility comments: Superviison for safety ?  ? ?Transfers ?Overall transfer level: Needs assistance ?Equipment used: Rolling walker (2 wheels) ?Transfers: Sit to/from Stand ?Sit to Stand: Supervision ?  ?  ?  ?  ?  ?General transfer comment: Supervision and use of RW for anterior weight shift ?  ? ?  ?Balance Overall balance assessment: Needs assistance ?Sitting-balance support: No upper extremity supported, Feet supported ?Sitting balance-Leahy Scale: Good ?  ?  ?Standing balance support: No upper extremity supported, During functional activity, Bilateral upper extremity supported ?Standing balance-Leahy Scale: Fair ?  ?  ?  ?  ?  ?  ?  ?  ?  ?  ?  ?  ?   ? ?ADL either performed or assessed with clinical judgement  ? ?ADL Overall ADL's : Needs assistance/impaired ?Eating/Feeding: Set up;Sitting ?  ?Grooming: Oral care;Supervision/safety;Set up;Standing ?Grooming Details (indicate cue type and reason): Leaning against sink for support. Performing oral care with Supervision. ?Upper Body Bathing: Supervision/ safety;Set up;Sitting ?  ?Lower Body Bathing: Min guard;Sit to/from stand ?  ?Upper Body Dressing : Supervision/safety;Set up;Sitting ?  ?Lower Body Dressing: Min guard;Sit to/from stand ?  ?Toilet Transfer: Supervision/safety;Ambulation (simulated to recliner) ?  ?  ?  ?  ?  ?Functional mobility during ADLs: Supervision/safety;Rolling walker (2 wheels) ?General ADL Comments: Pt demonstrating decreased cognition with poor attention, awareness, and problem solving. Pt with decreased balance during ADLs and dynamic movement. With mobility in hallway, pt performing at [place] level with  RW.  ? ? ? ?Vision Baseline Vision/History: 1 Wears  glasses (reading) ?   ?   ?Perception   ?  ?Praxis   ?  ? ?Pertinent Vitals/Pain Pain Assessment ?Pain Assessment: No/denies pain  ? ? ? ?Hand Dominance Left ?  ?Extremity/Trunk Assessment Upper Extremity Assessment ?Upper Extremity Assessment: Overall WFL for tasks assessed ?  ?Lower Extremity Assessment ?Lower Extremity Assessment: Overall WFL for tasks assessed ?  ?Cervical / Trunk Assessment ?Cervical / Trunk Assessment: Normal ?  ?Communication Communication ?Communication: No difficulties ?  ?Cognition Arousal/Alertness: Awake/alert ?Behavior During Therapy: Flat affect ?Overall Cognitive Status: Impaired/Different from baseline ?Area of Impairment: Attention, Orientation, Awareness, Problem solving, Memory, Safety/judgement, Following commands ?  ?  ?  ?  ?  ?  ?  ?  ?Orientation Level: Disoriented to, Time (Stating it is Saturday) ?Current Attention Level: Selective, Sustained ?Memory: Decreased short-term memory ?Following Commands: Follows one step commands with increased time, Follows multi-step commands inconsistently ?Safety/Judgement: Decreased awareness of safety, Decreased awareness of deficits ?Awareness: Emergent ?Problem Solving: Slow processing, Requires verbal cues ?General Comments: Pt presenting with decreased awareness of deficits and attention. In hallway, pt reutrning to room and requiring cues for recall of room number. Pt also passing his room three times before problem solving where his room was - abel to stop him self after passing several other rooms. ?  ?  ?General Comments  VSS ? ?  ?Exercises   ?  ?Shoulder Instructions    ? ? ?Home Living Family/patient expects to be discharged to:: Private residence ?Living Arrangements: Other relatives ?Available Help at Discharge: Family ?Type of Home: House ?Home Access: Stairs to enter ?Entrance Stairs-Number of Steps: 3 ?  ?Home Layout: One level ?  ?  ?Bathroom Shower/Tub: Walk-in shower ?  ?Bathroom Toilet: Standard ?  ?  ?Home Equipment:  Cane - single Barista (2 wheels) ?  ?Additional Comments: Home information and DME very different than last admission on 03/11/2022 ?  ? ?  ?Prior Functioning/Environment Prior Level of Function : History of Falls (last six months) ?  ?  ?  ?  ?  ?  ?Mobility Comments: occassional use of cane ?ADLs Comments: Performs BADLs. Family and friend does IADLs ?  ? ?  ?  ?OT Problem List: Decreased strength;Decreased activity tolerance;Impaired balance (sitting and/or standing);Decreased safety awareness ?  ?   ?OT Treatment/Interventions: Self-care/ADL training;Energy conservation;DME and/or AE instruction;Therapeutic activities;Patient/family education;Balance training  ?  ?OT Goals(Current goals can be found in the care plan section) Acute Rehab OT Goals ?Patient Stated Goal: Get stronger ?OT Goal Formulation: With patient ?Time For Goal Achievement: 04/15/22 ?Potential to Achieve Goals: Good  ?OT Frequency: Min 2X/week ?  ? ?Co-evaluation   ?  ?  ?  ?  ? ?  ?AM-PAC OT "6 Clicks" Daily Activity     ?Outcome Measure Help from another person eating meals?: None ?Help from another person taking care of personal grooming?: A Little ?Help from another person toileting, which includes using toliet, bedpan, or urinal?: A Little ?Help from another person bathing (including washing, rinsing, drying)?: A Little ?Help from another person to put on and taking off regular upper body clothing?: A Little ?Help from another person to put on and taking off regular lower body clothing?: A Little ?6 Click Score: 19 ?  ?End of Session Equipment Utilized During Treatment: Gait belt;Rolling walker (2 wheels) ?Nurse Communication: Mobility status ? ?Activity Tolerance: Patient tolerated treatment well ?Patient left: in chair;with call  bell/phone within reach;with chair alarm set ? ?OT Visit Diagnosis: Unsteadiness on feet (R26.81);Repeated falls (R29.6);Muscle weakness (generalized) (M62.81);History of falling (Z91.81)  ?               ?Time: 1916-6060 ?OT Time Calculation (min): 26 min ?Charges:  OT General Charges ?$OT Visit: 1 Visit ?OT Evaluation ?$OT Eval Low Complexity: 1 Low ?OT Treatments ?$Self Care/Home Management : 8-22 mins ?

## 2022-04-01 NOTE — Care Management Obs Status (Signed)
MEDICARE OBSERVATION STATUS NOTIFICATION ? ? ?Patient Details  ?Name: FLAY GHOSH ?MRN: [id number] ?Date of Birth: [birth year] ? ? ?Medicare Observation Status Notification Given:  Yes ? ? ? ?Verdell Carmine, RN ?04/01/2022, 9:58 AM ?

## 2022-04-01 NOTE — TOC Transition Note (Addendum)
Transition of Care (TOC) - CM/SW Discharge Note ? ? ?Patient Details  ?Name: Jeffrey [name] ?MRN: [id number] ?Date of Birth: [birth year] ? ?Transition of Care (TOC) CM/SW Contact:  ?Verdell Carmine, RN ?Phone Number: ?04/01/2022, 12:36 PM ? ? ?Clinical Narrative:    ?Discussed recommendation from PT with patient. He states he can get a walker from a friend and he does not need home health at this time. He  states he lives with his sister and has a friend that stays with him helping him.  ? He understands if he changes his mind, we can assist him in getting these things should he wish.  ?Patient will likely be DC tonight or tomorrow. ?77 Spoke with wife via Dr E. Regarding HH. Wife states he has a walker at home, she would like [facility] RN and PT, she wa sin the room with the patient. He is aware of the change. [facility] to see if they will accept., they cannot ?[facility]) checking  for acceptance ? [place] accepted for start of Monday. [name], patient wife made aware they would call and set up  ? ?  ?Barriers to Discharge: No Barriers Identified ? ? ?Patient Goals and CMS Choice ?Patient states their goals for this hospitalization and ongoing recovery are:: return home lives with sister ?  ?  ? ?Discharge Placement ?  ?           ?  ?  ?  ?  ? ?Discharge Plan and Services ?  ?  ?           ?DME Arranged: N/A (patient states he can get a walker) ?  ?  ?  ?  ?  ?  ?  ?  ?  ? ?Social Determinants of Health (SDOH) Interventions ?  ? ? ?Readmission Risk Interventions ? ?  03/17/2022  ?  9:45 AM  ?Readmission Risk Prevention Plan  ?Transportation Screening Complete  ?PCP or Specialist Appt within 3-5 Days Complete  ?[place] or Home Care Consult Complete  ?Social Work Consult for [place] Planning/Counseling Complete  ?Palliative Care Screening Not Applicable  ?Medication Review Press photographer) Complete  ? ? ? ? ? ?

## 2022-04-01 NOTE — Discharge Summary (Signed)
Physician Discharge Summary  ?Jeffrey [name] EQA:[id number] DOB: Nov 02, 1966 DOA: 03/31/2022 ? ?PCP: Charlott Rakes, MD ? ?Admit date: 03/31/2022 ?Discharge date: 04/01/2022 ? ?Admitted From: Home ?Disposition:  Home ? ?Recommendations for Outpatient Followup-:  ?Follow up with PCP in 1-2 weeks ?Please obtain BMP/CBC in one week ? ?Home Health:YES ? ? ?Discharge Condition: Stable ?CODE STATUS:FULL ?Diet recommendation: Heart Healthy / Carb Modified  ? ?Brief/Interim Summary: ? ?Jeffrey [name] is a 56 y.o. 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 Upon EMS arrival patient's blood sugar was reportedly 49.  Wife notes they had some trouble establishing IV.  Patient given a bag of D10 IV fluid and blood sugar was 209. ?-   Patient had just recently been hospitalized from 3/30-4/6 after presenting with weakness and confusion secondary found to be hypoglycemic. At home patient has been doing okay,   ? ? ?Acute metabolic encephalopathy secondary to hypoglycemia ?-CT head with no acute findings ?-This is secondary to hypoglycemia, this has resolved, mentation back to baseline ? ?chronic kidney disease stage III B ?-Function at baseline ?  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  During last hospitalization it was recommended patient have repeat echocardiogram. ?-Continue with Coreg, losartan, Imdur and hydralazine, not a candidate for ACE/ARB/Entresto due to renal failure ?-Resume home Lasix on discharge ?-Euvolemic ? ?  ?Generalized weakness ?-PT/OT consulted, will arrange for home health ?  ?Essential hypertension ?Blood pressures noted to be elevated up to 167/81.  Home blood pressure regimen includes amlodipine 10 mg daily, hydralazine  100 mg 3 times daily, Coreg 12.5 mg twice daily, isosorbide mononitrate 30 mg daily, lisinopril 10 mg daily and Lasix 40 mg twice daily. ?-Blood pressure controlled at time of discharge ? ?Anemia of chronic kidney disease ?Chronic.  Hemoglobin 11.2 g/dL and appears relatively stable. ?-Continue to monitor ?  ?Hyperlipidemia ?Last available LDL 120 from 06/2021.  Patient on Crestor 40 mg daily. ?-Continue statin ?  ?BPH ?-Continue Flomax ?  ?Obesity ?BMI 33.67 kg/m? ?  ? ?Discharge Diagnoses:  ?Principal Problem: ?  Hypoglycemia associated with type 2 diabetes mellitus ([place]) ?Active Problems: ?  Acute metabolic encephalopathy ?  Chronic combined systolic (congestive) and diastolic (congestive) heart failure (HCC) ?  Hyperlipidemia ?  S/P ICD (internal cardiac defibrillator) procedure ?  Essential hypertension ?  Anemia due to chronic kidney disease ?  Obesity (BMI 30-39.9) ?  BPH (benign prostatic hyperplasia) ? ?  ? ?Discharge Instructions ? ?Discharge Instructions   ? ? Diet - low sodium heart healthy   Complete by: As directed ?  ? Increase activity slowly   Complete by: As directed ?  ? ?  ? ?Allergies as of 04/01/2022   ?No Known Allergies ?  ? ?  ?Medication List  ?  ? ?TAKE these medications   ? ?Accu-Chek Guide test strip ?Generic drug: glucose blood ?Use to check blood sugar three times daily E11.65 ?  ?Accu-Chek Guide w/Device Kit ?Use to check blood sugar three times daily E11.65 ?  ?Accu-Chek Softclix Lancets lancets ?Use to check blood sugar three times daily E11.65 ?  ?amLODipine 10 MG tablet ?Commonly known as: NORVASC ?Take 1 tablet (10 mg total) by mouth daily. ?  ?aspirin 81 MG EC tablet ?Take 1  tablet (81 mg total) by mouth daily. Swallow whole. ?  ?BD Pen Needle Nano U/F 32G X 4 MM Misc ?Generic drug: Insulin Pen Needle ?USE TO INJECT LANTUS DAILY. MUST USE NEW PEN NEEDLE WITH EACH INJECTION. ?  ?carvedilol 12.5 MG tablet ?Commonly known as: COREG ?TAKE 1 TABLET (12.5 MG TOTAL) BY MOUTH 2 (TWO)  TIMES DAILY WITH A MEAL. ?What changed: how much to take ?  ?cetirizine 10 MG tablet ?Commonly known as: ZYRTEC ?TAKE 1 TABLET (10 MG TOTAL) BY MOUTH DAILY. ?What changed:  ?when to take this ?reasons to take this ?  ?ferrous sulfate 325 (65 FE) MG tablet ?Take 1 tablet (325 mg total) by mouth 2 (two) times daily with a meal. ?  ?FreeStyle [place] 2 Reader Kerrin Mo ?Use as directed four to five times daily. ?  ?FreeStyle Libre 2 Sensor Misc ?Use as directed, to montior blood sugar 4 to 5 times a day daily. ?  ?furosemide 40 MG tablet ?Commonly known as: LASIX ?Take 1 tablet (40 mg total) by mouth 2 (two) times daily. ?  ?gabapentin 300 MG capsule ?Commonly known as: NEURONTIN ?Take 1 capsule (300 mg total) by mouth at bedtime. ?What changed: how much to take ?  ?hydrALAZINE 100 MG tablet ?Commonly known as: APRESOLINE ?Take 1 tablet (100 mg total) by mouth 3 (three) times daily. ?  ?isosorbide mononitrate 30 MG 24 hr tablet ?Commonly known as: IMDUR ?Take 1 tablet (30 mg total) by mouth daily. ?  ?Lantus SoloStar 100 UNIT/ML Solostar Pen ?Generic drug: insulin glargine ?Inject 20 Units into the skin daily. ?What changed: how much to take ?  ?lisinopril 10 MG tablet ?Commonly known as: ZESTRIL ?Take 10 mg by mouth daily. ?  ?NovoLOG FlexPen 100 UNIT/ML FlexPen ?Generic drug: insulin aspart ?Inject 0-9 Units into the skin See admin instructions. Inject 0-9 units into the skin 3 times a day with meals, PER SLIDING SCALE:  ?CBG 70 - 120 =  0 unit ?CBG 121 - 150: 1 unit  ?CBG 151 - 200: 2 units  ?CBG 201 - 250: 3 units  ?CBG 251 - 300: 5 units  ?CBG 301 - 350: 7 units  ?CBG 351 - 400 9 units ?  ?ondansetron 4 MG tablet ?Commonly known as: ZOFRAN ?Take 1 tablet (4 mg total) by mouth every 6 (six) hours as needed for nausea or vomiting. ?  ?potassium chloride SA 20 MEQ tablet ?Commonly known as: KLOR-CON M ?Take 1 tablet (20 mEq total) by mouth 2 (two) times daily. ?  ?rosuvastatin 40 MG tablet ?Commonly known as: CRESTOR ?TAKE  1 TABLET (40 MG TOTAL) BY MOUTH DAILY. ?What changed: how much to take ?  ?T.E.D. Knee Length/S-Long Misc ?1 (one) each daily, apply compression stockings daily to help decrease leg swelling ?  ?tamsulosin 0.4 MG Caps capsule ?Commonly known as: FLOMAX ?Take 1 capsule by mouth nightly, Stop if it causes dizziness ?  ?Ventolin HFA 108 (90 Base) MCG/ACT inhaler ?Generic drug: albuterol ?Inhale 1 to 2 puffs by mouth into the lungs every 6 hours as needed for wheezing or shortness of breath. ?  ?Vitamin D (Ergocalciferol) 1.25 MG (50000 UNIT) Caps capsule ?Commonly known as: DRISDOL ?Take 1 capsule by mouth weekly. ?What changed:  ?how much to take ?how to take this ?when to take this ?additional instructions ?  ? ?  ? ? Follow-up Information   ? ? Charlott Rakes, MD Follow up in 1 week(s).   ?Specialty: Family Medicine ?Contact information: ?[place]  [address] ?[address] ?[place] [address] ?[phone] ? ? ?  ?  ? ?  ?  ? ?  ? ?No Known Allergies ? ?Consultations: ?none ? ? ?Procedures/Studies: ?CT Head Wo Contrast ? ?Result Date: 03/31/2022 ?CLINICAL DATA:  Mental status change with unknown cause EXAM: CT HEAD WITHOUT CONTRAST TECHNIQUE: Contiguous axial images were obtained from the base of the skull through the vertex without intravenous contrast. RADIATION DOSE REDUCTION: This exam was performed according to the departmental dose-optimization program which includes automated exposure control, adjustment of the mA and/or kV according to patient size and/or use of iterative reconstruction technique. COMPARISON:  Sixteen days ago FINDINGS: Brain: No evidence of acute infarction, hemorrhage, hydrocephalus, extra-axial collection or mass lesion/mass effect. Low-density in the cerebral white matter attributed to chronic small vessel ischemia given medical history. Chronic lacunar infarcts at the bilateral basal ganglia/deep white matter. Generalized mild cortical volume loss. Vascular: No hyperdense vessel or  unexpected calcification. Skull: Normal. Negative for fracture or focal lesion. Sinuses/Orbits: Oil tamponade to the right globe.  No acute finding IMPRESSION: 1. No acute finding. 2. Chronic small vessel ischemia

## 2022-04-01 NOTE — Evaluation (Signed)
Physical Therapy Evaluation ?Patient Details ?Name: Jeffrey [name] ?MRN: [id number] ?DOB: 10/12/1966 ?Today's Date: 04/01/2022 ? ?History of Present Illness ? 56 yo male presenting to ED on 4/20 after being found unresponsive. Found to be hypoglycemic. Recent hospitalization ntly been hospitalized from 3/30-4/6 for hypoglycemia. PMH including anemia, CAD, DM, HTN, MI, R toe amputation, pacemaker, and back sx.  ?Clinical Impression ? Pt was seen for progressing mobiltiy after initially being seen with OT.  Pt is sporadically using SPC but this was mildly unsteady.  With rollator, PT could visualize better posterio-lateral balance control, and pt reports some previous experience with one.  Recommend he take rollator home providing pt is satisfied with the insurance coverage information requested for the payment of walker.  Pt is expecting to go home with assistance, so will have HHPT come to instruct him on balance skills and educate family and friends on his needs to follow along.     ?   ? ?Recommendations for follow up therapy are one component of a multi-disciplinary discharge planning process, led by the attending physician.  Recommendations may be updated based on patient status, additional functional criteria and insurance authorization. ? ?Follow Up Recommendations Home health PT ? ?  ?Assistance Recommended at Discharge Set up Supervision/Assistance  ?Patient can return home with the following ? A little help with walking and/or transfers;A little help with bathing/dressing/bathroom;Assistance with cooking/housework;Assist for transportation;Help with stairs or ramp for entrance ? ?  ?Equipment Recommendations Rollator (4 wheels)  ?Recommendations for Other Services ?    ?  ?Functional Status Assessment Patient has had a recent decline in their functional status and demonstrates the ability to make significant improvements in function in a reasonable and predictable amount of time.  ? ?  ?Precautions /  Restrictions Precautions ?Precautions: Fall ?Precaution Comments: history of falling at home per pt ?Restrictions ?Weight Bearing Restrictions: No  ? ?  ? ?Mobility ? Bed Mobility ?  ?  ?  ?  ?  ?  ?  ?General bed mobility comments: up in chair when PT arrived ?  ? ?Transfers ?Overall transfer level: Needs assistance ?Equipment used: Rollator (4 wheels) ?Transfers: Sit to/from Stand ?Sit to Stand: Supervision ?  ?  ?  ?  ?  ?General transfer comment: supervised with a bit of impulsivity ?  ? ?Ambulation/Gait ?Ambulation/Gait assistance: Supervision (on AD's to see what is safest) ?Gait Distance (Feet): 300 Feet ?Assistive device: Rollator (4 wheels), Straight cane ?Gait Pattern/deviations: Step-through pattern, Wide base of support, Decreased stride length ?Gait velocity: reduced ?Gait velocity interpretation: <1.31 ft/sec, indicative of household ambulator ?Pre-gait activities: standing balance ck ?General Gait Details: pt is using cane turned around backward, and uses rollator with some lack of awareness of the brakes despite reporting he knows about them ? ?Stairs ?  ?  ?  ?  ?  ? ?Wheelchair Mobility ?  ? ?Modified Rankin (Stroke Patients Only) ?  ? ?  ? ?Balance Overall balance assessment: Needs assistance ?Sitting-balance support: Feet supported ?Sitting balance-Leahy Scale: Good ?  ?  ?Standing balance support: Bilateral upper extremity supported, Single extremity supported ?Standing balance-Leahy Scale: Fair ?Standing balance comment: fair static balance but needs UE support for dynamics ?  ?  ?  ?  ?  ?  ?  ?  ?  ?  ?  ?   ? ? ? ?Pertinent Vitals/Pain Pain Assessment ?Pain Assessment: No/denies pain  ? ? ?Home Living Family/patient expects to be discharged to:: Private residence ?Living  Arrangements: Other relatives ?Available Help at Discharge: Family ?Type of Home: House ?Home Access: Stairs to enter ?Entrance Stairs-Rails: Right ?Entrance Stairs-Number of Steps: 3 ?  ?Home Layout: One level ?Home  Equipment: Cane - single Barista (2 wheels) ?   ?  ?Prior Function Prior Level of Function : History of Falls (last six months) ?  ?  ?  ?  ?  ?  ?Mobility Comments: occassional use of cane ?  ?  ? ? ?Hand Dominance  ? Dominant Hand: Left ? ?  ?Extremity/Trunk Assessment  ? Upper Extremity Assessment ?Upper Extremity Assessment: Defer to OT evaluation ?  ? ?Lower Extremity Assessment ?Lower Extremity Assessment: Overall WFL for tasks assessed ?  ? ?Cervical / Trunk Assessment ?Cervical / Trunk Assessment: Normal  ?Communication  ? Communication: No difficulties  ?Cognition Arousal/Alertness: Awake/alert ?Behavior During Therapy: Flat affect ?Overall Cognitive Status: Impaired/Different from baseline ?Area of Impairment: Problem solving, Awareness, Safety/judgement, Following commands, Memory, Attention, Orientation ?  ?  ?  ?  ?  ?  ?  ?  ?Orientation Level: Time, Situation ?Current Attention Level: Selective ?Memory: Decreased short-term memory ?Following Commands: Follows one step commands with increased time ?Safety/Judgement: Decreased awareness of safety, Decreased awareness of deficits ?Awareness: Intellectual ?Problem Solving: Slow processing, Requires verbal cues, Requires tactile cues ?General Comments: forgetting room but also not reliable about setting mobility limits ?  ?  ? ?  ?General Comments General comments (skin integrity, edema, etc.): pt is demonstrating no SOB, no outright LOB but is tending to leave AD at the edge of the bed and try to walk without UE support unsafely ? ?  ?Exercises    ? ?Assessment/Plan  ?  ?PT Assessment Patient needs continued PT services  ?PT Problem List Decreased activity tolerance;Decreased balance;Decreased mobility;Decreased knowledge of use of DME;Decreased safety awareness ? ?   ?  ?PT Treatment Interventions Therapeutic activities;Gait training;Therapeutic exercise;Patient/family education;Balance training;Stair training;Functional mobility  training;DME instruction;Neuromuscular re-education   ? ?PT Goals (Current goals can be found in the Care Plan section)  ?Acute Rehab PT Goals ?Patient Stated Goal: to get home and feel better ?PT Goal Formulation: With patient ?Time For Goal Achievement: 04/08/22 ?Potential to Achieve Goals: Good ? ?  ?Frequency Min 3X/week ?  ? ? ?Co-evaluation   ?  ?  ?  ?  ? ? ?  ?AM-PAC PT "6 Clicks" Mobility  ?Outcome Measure Help needed turning from your back to your side while in a flat bed without using bedrails?: None ?Help needed moving from lying on your back to sitting on the side of a flat bed without using bedrails?: None ?Help needed moving to and from a bed to a chair (including a wheelchair)?: A Little ?Help needed standing up from a chair using your arms (e.g., wheelchair or bedside chair)?: A Little ?Help needed to walk in hospital room?: A Little ?Help needed climbing 3-5 steps with a railing? : A Little ?6 Click Score: 20 ? ?  ?End of Session Equipment Utilized During Treatment: Gait belt ?Activity Tolerance: Patient tolerated treatment well ?Patient left: in chair;with call bell/phone within reach;with chair alarm set ?Nurse Communication: Mobility status ?PT Visit Diagnosis: History of falling (Z91.81);Unsteadiness on feet (R26.81) ?  ? ?Time: 2947-6546 ?PT Time Calculation (min) (ACUTE ONLY): 26 min ? ? ?Charges:   PT Evaluation ?$PT Eval Moderate Complexity: 1 Mod ?PT Treatments ?$Gait Training: 8-22 mins ?  ?   ? ?Ramond Dial ?04/01/2022, 2:11 PM ? ?Mee Hives, PT  PhD ?Acute Rehab Dept. Number: [facility] [phone] and [place] [phone] ? ? ?

## 2022-04-01 NOTE — Discharge Instructions (Signed)
Follow with Primary MD Charlott Rakes, MD in 7 days  ? ?Get CBC, CMP,checked  by Primary MD next visit.  ? ? ?Activity: As tolerated with Full fall precautions use walker/cane & assistance as needed ? ? ?Disposition Home  ? ? ?Diet: Carb modified ? ? ?On your next visit with your primary care physician please Get Medicines reviewed and adjusted. ? ? ?Please request your Prim.MD to go over all Hospital Tests and Procedure/Radiological results at the follow up, please get all Hospital records sent to your Prim MD by signing hospital release before you go home. ? ? ?If you experience worsening of your admission symptoms, develop shortness of breath, life threatening emergency, suicidal or homicidal thoughts you must seek medical attention immediately by calling 911 or calling your MD immediately  if symptoms less severe. ? ?You Must read complete instructions/literature along with all the possible adverse reactions/side effects for all the Medicines you take and that have been prescribed to you. Take any new Medicines after you have completely understood and accpet all the possible adverse reactions/side effects.  ? ?Do not drive, operating heavy machinery, perform activities at heights, swimming or participation in water activities or provide baby sitting services if your were admitted for syncope or siezures until you have seen by Primary MD or a Neurologist and advised to do so again. ? ?Do not drive when taking Pain medications.  ? ? ?Do not take more than prescribed Pain, Sleep and Anxiety Medications ? ?Special Instructions: If you have smoked or chewed Tobacco  in the last 2 yrs please stop smoking, stop any regular Alcohol  and or any Recreational drug use. ? ?Wear Seat belts while driving. ? ? ?Please note ? ?You were cared for by a hospitalist during your hospital stay. If you have any questions about your discharge medications or the care you received while you were in the hospital after you are discharged,  you can call the unit and asked to speak with the hospitalist on call if the hospitalist that took care of you is not available. Once you are discharged, your primary care physician will handle any further medical issues. Please note that NO REFILLS for any discharge medications will be authorized once you are discharged, as it is imperative that you return to your primary care physician (or establish a relationship with a primary care physician if you do not have one) for your aftercare needs so that they can reassess your need for medications and monitor your lab values.  ?

## 2022-04-01 NOTE — Progress Notes (Signed)
Spoke with patient and wife at the bedside in regards to the [employer] 2 CGM.  Took patient 1 CGM and information with it. Tried to get the app for reading the CGM on his Android phone, but wife was unable to find the app. She spoke with the patient's sister who has one of these CGMs. She will help them get the app on the phone and help with applying the CGM.  Encouraged the wife to make another appointment with Dr. Glee Arvin, since his appointment had to be canceled while he was in the hospital. Suggested that they talk with her about ordering more of the CGM's if he likes it. They do have a home blood glucose meter and strips at home. ? ?Harvel Ricks RN BSN CDE ?Diabetes Coordinator ?Pager: [phone]  8am-5pm  ? ? ?

## 2022-04-03 LAB — UREA NITROGEN, URINE: Urea Nitrogen, Ur: 295 mg/dL

## 2022-04-04 ENCOUNTER — Telehealth: Payer: Self-pay

## 2022-04-04 ENCOUNTER — Other Ambulatory Visit: Payer: Self-pay

## 2022-04-04 NOTE — Telephone Encounter (Signed)
Transition Care Management Follow-up Telephone Call ?Date of discharge and from where: 04/01/2022, [facility]  ?I spoke to patient and then his wife, [name]. She said that he is doing okay and has transferred PCP to [facility].  ?

## 2022-04-05 ENCOUNTER — Telehealth: Payer: Self-pay | Admitting: Family Medicine

## 2022-04-05 ENCOUNTER — Other Ambulatory Visit: Payer: Self-pay

## 2022-04-05 ENCOUNTER — Other Ambulatory Visit: Payer: Self-pay | Admitting: Family Medicine

## 2022-04-05 DIAGNOSIS — E1121 Type 2 diabetes mellitus with diabetic nephropathy: Secondary | ICD-10-CM

## 2022-04-05 NOTE — Telephone Encounter (Signed)
Copied from [place] [id number]. Topic: Quick Communication - Home Health Verbal Orders ?>> Apr 05, 2022 10:41 AM Leward Quan A wrote: ?Caller/Agency: [facility] // [facility]  ?Callback Number: [phone] ?Requesting OT/PT/Skilled Nursing/Social Work/Speech Therapy: Skilled Nursing  ?Frequency: Need order to move start of care to 04/07/22 instead of today ?

## 2022-04-05 NOTE — Telephone Encounter (Signed)
Message per Eden Lathe on 04/04/2022- ? ?Transition Care Management Follow-up Telephone Call ?Date of discharge and from where: 04/01/2022, [facility]  ?I spoke to patient and then his wife, [name]. She said that he is doing okay and has transferred PCP to [facility].  ? ?Informed [facility], VO were not approved. Would need to reach out to [facility] or patient and wife. Left this message on voicemail.  ?

## 2022-04-06 ENCOUNTER — Other Ambulatory Visit: Payer: Self-pay

## 2022-04-07 ENCOUNTER — Other Ambulatory Visit: Payer: Self-pay

## 2022-04-08 ENCOUNTER — Other Ambulatory Visit: Payer: Self-pay

## 2022-04-08 MED ORDER — INSULIN GLARGINE SOLOSTAR 100 UNIT/ML ~~LOC~~ SOPN
PEN_INJECTOR | SUBCUTANEOUS | 3 refills | Status: DC
  Filled 2022-04-08: qty 18, 90d supply, fill #0
  Filled 2022-04-08: qty 6, 30d supply, fill #0

## 2022-04-08 MED ORDER — INSULIN GLARGINE SOLOSTAR 100 UNIT/ML ~~LOC~~ SOPN
PEN_INJECTOR | SUBCUTANEOUS | 3 refills | Status: DC
  Filled 2022-04-08: qty 15, 75d supply, fill #0

## 2022-04-12 ENCOUNTER — Encounter: Payer: Self-pay | Admitting: Cardiovascular Disease

## 2022-04-12 ENCOUNTER — Other Ambulatory Visit: Payer: Self-pay

## 2022-04-12 ENCOUNTER — Ambulatory Visit (INDEPENDENT_AMBULATORY_CARE_PROVIDER_SITE_OTHER): Payer: Medicare Other | Admitting: Cardiovascular Disease

## 2022-04-12 VITALS — BP 132/80 | HR 72 | Ht 67.0 in | Wt 220.4 lb

## 2022-04-12 DIAGNOSIS — I2510 Atherosclerotic heart disease of native coronary artery without angina pectoris: Secondary | ICD-10-CM

## 2022-04-12 DIAGNOSIS — I5042 Chronic combined systolic (congestive) and diastolic (congestive) heart failure: Secondary | ICD-10-CM

## 2022-04-12 MED ORDER — INSULIN GLARGINE SOLOSTAR 100 UNIT/ML ~~LOC~~ SOPN
PEN_INJECTOR | SUBCUTANEOUS | 6 refills | Status: DC
  Filled 2022-04-12: qty 15, 75d supply, fill #0

## 2022-04-12 NOTE — Patient Instructions (Signed)
Medication Instructions:  Your physician recommends that you continue on your current medications as directed. Please refer to the Current Medication list given to you today.  *If you need a refill on your cardiac medications before your next appointment, please call your pharmacy*   Lab Work: NONE If you have labs (blood work) drawn today and your tests are completely normal, you will receive your results only by: MyChart Message (if you have MyChart) OR A paper copy in the mail If you have any lab test that is abnormal or we need to change your treatment, we will call you to review the results.   Testing/Procedures: NONE   Follow-Up: At [facility], you and your health needs are our priority.  As part of our continuing mission to provide you with exceptional heart care, we have created designated Provider Care Teams.  These Care Teams include your primary Cardiologist (physician) and Advanced Practice Providers (APPs -  Physician Assistants and Nurse Practitioners) who all work together to provide you with the care you need, when you need it.  Your next appointment:   1 year(s)  The format for your next appointment:   In Person  Provider:   Philip Nahser, MD      Important Information About Sugar       

## 2022-04-12 NOTE — Progress Notes (Signed)
?Cardiology Office Note:   ? ?Date:  04/12/2022  ? ?ID:  Jeffrey [name], DOB Mar 03, 1966, MRN [id number] ? ?PCP:  Willene Hatchet, NP ?  ?[facility] Providers ?Cardiologist:  Mertie Moores, MD ?Electrophysiologist:  Virl Axe, MD { ? ? ?Referring MD: Charlott Rakes, MD  ? ?Chief Complaint  ?Patient presents with  ? Congestive Heart Failure  ?   ?  ? ? ?History of Present Illness:   ? ?Jeffrey [name] is a 56 y.o. male with a hx of nonischemic cardiomyopathy. ?Seen with wife , Jeffrey [name]  ?I met him during a hospitalization in 2018.  He had a right left heart catheterization.  His ejection fraction was 10 to 15% at that time. ?He has been seen by Dr. Caryl Comes over the past 5 years.  He has had an ICD placed for syncope. ? ?He has chronic kidney disease.  His last creatinine was 3.29 as of 04/01/22 ? ?Is having issues with diabetic control  ?Now has a continuous glucose monitor ?Was in the hospital recently  ?Had 2 small strokes and seizure recently  ?' ?Echocardiogram from March 31, 2022 shows normal left ventricular systolic function.  He has grade 1 diastolic dysfunction. ? ?Lots of issues ?Frequent episodes of hypoglycemia.  Occasionally the wife can give him extra glucose containing foods and bring him out of this but sometimes she has to call EMS.  He is having lots of diffuse leg swelling, hand swelling, facial swelling ? ?No complaints of CP  ?HbA1c is 9.7 ?HbA1C 14 in July 2022.  ? ?He has been falling frequntly  ?Wife is gettign a Engineer, civil (consulting)  ? ?Past Medical History:  ?Diagnosis Date  ? AICD (automatic cardioverter/defibrillator) present   ? Medtronic  ? AICD (automatic cardioverter/defibrillator) present   ? MDT Visia AF MRI  ? Anemia   ? CAD (coronary artery disease)   ? a. cath 01/31/17: 60% 1st RPLB, 60% dist RCA, 55% prox RCA, 10% pro LAD --> Rx TX.   ? CHF (congestive heart failure) ([place])   ? Chronic systolic CHF (congestive heart failure) ([place]) 01/28/2017  ? 1. Echo 01/29/17:  EF 20-25, normal  wall motion, mild LAE // 2. EF 10-15 by [facility] 01/2017   ? Coronary artery disease   ? Diabetes mellitus   ? type II  ? Diabetes mellitus without complication ([place])   ? Diabetic foot infection ([place]) 03/2016  ? RT FOOT  ? Dyspnea   ? History of kidney stones   ? passed  ? History of kidney stones   ? HTN (hypertension)   ? Hyperlipidemia   ? Hypertension   ? Myocardial infarction [facility]), although reported, NICM at cath   ? NICM (nonischemic cardiomyopathy) ([place]) 02/15/2017  ? 1. Mod non-obs CAD on LHC in 01/2017 - CAD does not explain cardiomyopathy  ? Renal disorder   ? ? ?Past Surgical History:  ?Procedure Laterality Date  ? AIR/FLUID EXCHANGE Right 07/14/2020  ? Procedure: AIR/FLUID EXCHANGE;  Surgeon: Jalene Mullet, MD;  Location: [place];  Service: Ophthalmology;  Laterality: Right;  ? AMPUTATION Right 04/01/2016  ? Procedure: Right Great Toe Amputation;  Surgeon: Newt Minion, MD;  Location: [place];  Service: Orthopedics;  Laterality: Right;  ? AMPUTATION Right 06/19/2016  ? Procedure: AMPUTATION SECOND TOE;  Surgeon: Marybelle Killings, MD;  Location: [place];  Service: Orthopedics;  Laterality: Right;  ? BACK SURGERY    ? for abscess  ? CARDIAC CATHETERIZATION    ?  ICD IMPLANT N/A 01/15/2018  ? Procedure: ICD IMPLANT;  Surgeon: Deboraha Sprang, MD;  Location: [place] [facility];  Service: Cardiovascular;  Laterality: N/A;  ? INJECTION OF SILICONE OIL Right 08/13/6383  ? Procedure: INJECTION OF SILICONE OIL;  Surgeon: Jalene Mullet, MD;  Location: [place];  Service: Ophthalmology;  Laterality: Right;  ? INJECTION OF SILICONE OIL Right 6/65/9935  ? Procedure: INJECTION OF SILICONE OIL;  Surgeon: Jalene Mullet, MD;  Location: [place];  Service: Ophthalmology;  Laterality: Right;  ? INSERT / REPLACE / REMOVE PACEMAKER    ? MEMBRANE PEEL Right 08/25/2020  ? Procedure: MEMBRANE PEEL;  Surgeon: Jalene Mullet, MD;  Location: [place];  Service: Ophthalmology;  Laterality: Right;  ? PARS PLANA VITRECTOMY Right 07/14/2020  ? Procedure: PARS  PLANA VITRECTOMY WITH 25 GAUGE, Membranetomy, drainage of subretinal fluid;  Surgeon: Jalene Mullet, MD;  Location: [place];  Service: Ophthalmology;  Laterality: Right;  ? PARS PLANA VITRECTOMY Right 08/25/2020  ? Procedure: PARS PLANA VITRECTOMY WITH 25 GAUGE;  Surgeon: Jalene Mullet, MD;  Location: [place];  Service: Ophthalmology;  Laterality: Right;  ? PHOTOCOAGULATION WITH LASER Right 07/14/2020  ? Procedure: PHOTOCOAGULATION WITH LASER;  Surgeon: Jalene Mullet, MD;  Location: [place];  Service: Ophthalmology;  Laterality: Right;  ? PHOTOCOAGULATION WITH LASER Right 08/25/2020  ? Procedure: PHOTOCOAGULATION WITH LASER;  Surgeon: Jalene Mullet, MD;  Location: [place];  Service: Ophthalmology;  Laterality: Right;  ? 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  ? Procedure: REPAIR OF HEMORRHAGIC DETACHMENT;  Surgeon: Jalene Mullet, MD;  Location: [place];  Service: Ophthalmology;  Laterality: Right;  ? RIGHT/LEFT HEART CATH AND CORONARY ANGIOGRAPHY N/A 01/31/2017  ? Procedure: Right/Left Heart Cath and Coronary Angiography;  Surgeon: Troy Sine, MD;  Location: [place] [facility];  Service: Cardiovascular;  Laterality: N/A;  ? SILICON OIL REMOVAL Right 08/25/2020  ? Procedure: SILICON OIL REMOVAL;  Surgeon: Jalene Mullet, MD;  Location: [place];  Service: Ophthalmology;  Laterality: Right;  ? ? ?Current Medications: ?Current Meds  ?Medication Sig  ? Accu-Chek Softclix Lancets lancets Use to check blood sugar three times daily E11.65  ? albuterol (VENTOLIN HFA) 108 (90 Base) MCG/ACT inhaler Inhale 1 to 2 puffs by mouth into the lungs every 6 hours as needed for wheezing or shortness of breath.  ? amLODipine (NORVASC) 10 MG tablet Take 1 tablet (10 mg total) by mouth daily.  ? aspirin 81 MG EC tablet Take 1 tablet (81 mg total) by mouth daily. Swallow whole.  ? Blood Glucose Monitoring Suppl (ACCU-CHEK GUIDE) w/Device KIT Use to check blood sugar three times daily E11.65  ? carvedilol (COREG) 12.5 MG  tablet TAKE 1 TABLET (12.5 MG TOTAL) BY MOUTH 2 (TWO) TIMES DAILY WITH A MEAL.  ? cetirizine (ZYRTEC) 10 MG tablet TAKE 1 TABLET (10 MG TOTAL) BY MOUTH DAILY.  ? Continuous Blood Gluc Receiver (FREESTYLE LIBRE 2 READER) DEVI Use as directed four to five times daily.  ? Continuous Blood Gluc Sensor (FREESTYLE LIBRE 2 SENSOR) MISC Use as directed, to montior blood sugar 4 to 5 times a day daily.  ? Elastic Bandages & Supports (T.E.D. KNEE LENGTH/S-LONG) MISC 1 (one) each daily, apply compression stockings daily to help decrease leg swelling  ? ferrous sulfate 325 (65 FE) MG tablet Take 1 tablet (325 mg total) by mouth 2 (two) times daily with a meal.  ? furosemide (LASIX) 40 MG tablet Take 1 tablet (40 mg total) by mouth 2 (two)  times daily.  ? gabapentin (NEURONTIN) 300 MG capsule Take 1 capsule (300 mg total) by mouth at bedtime.  ? glucose blood (ACCU-CHEK GUIDE) test strip Use to check blood sugar three times daily E11.65  ? hydrALAZINE (APRESOLINE) 100 MG tablet Take 1 tablet (100 mg total) by mouth 3 (three) times daily.  ? Insulin Glargine Solostar (LANTUS) 100 UNIT/ML Solostar Pen Inject 20 units into the skin nightly at bedtime  ? Insulin Glargine Solostar (LANTUS) 100 UNIT/ML Solostar Pen inject 20 units into the skin nightly  ? Insulin Glargine Solostar (LANTUS) 100 UNIT/ML Solostar Pen Inject 20 units into the skin nighly at bedtime.  ? Insulin Pen Needle (BD PEN NEEDLE NANO U/F) 32G X 4 MM MISC USE TO INJECT LANTUS DAILY. MUST USE NEW PEN NEEDLE WITH EACH INJECTION.  ? isosorbide mononitrate (IMDUR) 30 MG 24 hr tablet Take 1 tablet (30 mg total) by mouth daily.  ? LANTUS SOLOSTAR 100 UNIT/ML Solostar Pen Inject 20 Units into the skin daily.  ? lisinopril (ZESTRIL) 10 MG tablet Take 10 mg by mouth daily.  ? ondansetron (ZOFRAN) 4 MG tablet Take 1 tablet (4 mg total) by mouth every 6 (six) hours as needed for nausea or vomiting.  ? potassium chloride SA (KLOR-CON M) 20 MEQ tablet Take 1 tablet (20 mEq  total) by mouth 2 (two) times daily.  ? rosuvastatin (CRESTOR) 40 MG tablet TAKE 1 TABLET (40 MG TOTAL) BY MOUTH DAILY.  ? Vitamin D, Ergocalciferol, (DRISDOL) 1.25 MG (50000 UNIT) CAPS capsule Take 1 capsule

## 2022-04-13 ENCOUNTER — Other Ambulatory Visit: Payer: Self-pay

## 2022-04-13 NOTE — Progress Notes (Deleted)
Cardiology Office Note Date:  04/13/2022  Patient ID:  Jeffrey, [name] 1966/11/14, MRN [id number] PCP:  Willene Hatchet, NP  Cardiologist:  Dr. Acie Fredrickson Electrophysiologist: Dr. Caryl Comes    Chief Complaint: lost to f/u  History of Present Illness: Jeffrey [name] is a 56 y.o. male with history of non-obstructive CAD, NICM, IDDM, HTN, HLD, prior toe amputations 2/2 osteomyelitis, chronic CHF (systolic), ICD, CKD (III), stroke  He comes in today to be seen for Dr. Caryl Comes, last seen by him July 2020, was doing well, no changes were made.  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 No seizures seen on EEG, K+ 2.7 and gluc > 700. BP 201/104 Noted poor medication and diet compliance at home despite his sister trying to urge him to do so.  He also suffered acute/subact ue strokes Cardiology consulted, not felt to be volume OL, meds adjusted.  Discussed Delene Loll though held off given AKI/CKD and recommended try to get started out pt  August  hospitalized with hypoglycemia, BS 20's associated with AMS and LOC K+ and mag low again, AKI on CKD his entresto, K+  and aldactone stopped ER visit in October for hypoglycemia and hypertension, took his insulin without eating not his BP meds that AM  He saw Dr. Trula Slade 10/11/21 for Barostim, planned to proceed, looks like he is scheduled for 11/26/21  I saw him Nov 2022 He is accompanied by his girlfriend who is very knowledgeable about his medical issues and meedicines. Since his hospitalization in July he has struggled with his DM and volume.   She has been trying to get him to eat better and take better care of himself. They both report compliance is better the last couple months and hehas started to do better with his diet as well. For the last 3 mo or so about 1-2/week she has given him an extra lasix 2/2 SOB and swelling In the last 2 weeks or so this has  leveled off and has not required as much extra lasix as he did a couple months ago Since his hospital stay in July she says he has never been the same, slower all around, slower thinking and slower physically, they see neurology soon. He gets the barostim in a couple weeks He sleeps with several pillows Gets SOB with minimal exertion, an example walking from the witing room to the room he was winded. He is very sedentary as well No CP No near syncope or syncope No shocks NOt felt to be volume OL, planned to update labs and have him see Dr. Caryl Comes in a couple months BP was high but he had not taken his medicines.  He saw Dr. Caryl Comes March 2023, volume OL, lasix adjusted in d/w his nephrologist with plans to f/u with them for labs and revisit on volume.  Hospitalized 03/10/22 - 03/17/22 for hypoglycemia, AKI on CKD with weakness/metabolic encephalopathy, noted with very poor medication compliance, meds adjusted, education provided Mentioned MRI recommended not done 2/2 device, Neurontin held with improvement.  Hospitalized 03/31/22 - 04/01/22 found unresponsive by wife, apparently again with hypoglycemia Echo this admission with recovered LVEF 50-55%, severe conc LVH, grade I DD  He saw Dr. Acie Fredrickson 04/12/22, generally doing poorly, discussed recurrent hypoglycemic events, functional decline, falls, using a walker. He was diffusely edematous, significant salt in his diet, counseled, diuretics were deferred to his nephrologist  *** syncope?  All  BS?  Any arrhythmias? *** old strokes?  Any AF? *** otherwise needs other docs  Device information MDT single chamber ICD implanted 01/15/2018  11/26/21: Barostim implanted   Past Medical History:  Diagnosis Date   AICD (automatic cardioverter/defibrillator) present    Medtronic   AICD (automatic cardioverter/defibrillator) present    MDT Visia AF MRI   Anemia    CAD (coronary artery disease)    a. cath 01/31/17: 60% 1st RPLB, 60% dist RCA, 55% prox  RCA, 10% pro LAD --> Rx TX.    CHF (congestive heart failure) (HCC)    Chronic systolic CHF (congestive heart failure) ([place]) 01/28/2017   1. Echo 01/29/17:  EF 20-25, normal wall motion, mild LAE // 2. EF 10-15 by [facility] 01/2017    Coronary artery disease    Diabetes mellitus    type II   Diabetes mellitus without complication ([place])    Diabetic foot infection ([place]) 03/2016   RT FOOT   Dyspnea    History of kidney stones    passed   History of kidney stones    HTN (hypertension)    Hyperlipidemia    Hypertension    Myocardial infarction [facility]), although reported, NICM at cath    NICM (nonischemic cardiomyopathy) ([place]) 02/15/2017   1. Mod non-obs CAD on LHC in 01/2017 - CAD does not explain cardiomyopathy   Renal disorder     Past Surgical History:  Procedure Laterality Date   AIR/FLUID EXCHANGE Right 07/14/2020   Procedure: AIR/FLUID EXCHANGE;  Surgeon: Jalene Mullet, MD;  Location: [place];  Service: Ophthalmology;  Laterality: Right;   AMPUTATION Right 04/01/2016   Procedure: Right Great Toe Amputation;  Surgeon: Newt Minion, MD;  Location: [place];  Service: Orthopedics;  Laterality: Right;   AMPUTATION Right 06/19/2016   Procedure: AMPUTATION SECOND TOE;  Surgeon: Marybelle Killings, MD;  Location: [place];  Service: Orthopedics;  Laterality: Right;   BACK SURGERY     for abscess   CARDIAC CATHETERIZATION     ICD IMPLANT N/A 01/15/2018   Procedure: ICD IMPLANT;  Surgeon: Deboraha Sprang, MD;  Location: [place] [facility];  Service: Cardiovascular;  Laterality: N/A;   INJECTION OF SILICONE OIL Right 01/18/7823   Procedure: INJECTION OF SILICONE OIL;  Surgeon: Jalene Mullet, MD;  Location: [place];  Service: Ophthalmology;  Laterality: Right;   INJECTION OF SILICONE OIL Right 2/35/3614   Procedure: INJECTION OF SILICONE OIL;  Surgeon: Jalene Mullet, MD;  Location: [place];  Service: Ophthalmology;  Laterality: Right;   INSERT / REPLACE / REMOVE PACEMAKER     MEMBRANE PEEL Right 08/25/2020    Procedure: MEMBRANE PEEL;  Surgeon: Jalene Mullet, MD;  Location: [place];  Service: Ophthalmology;  Laterality: Right;   PARS PLANA VITRECTOMY Right 07/14/2020   Procedure: PARS PLANA VITRECTOMY WITH 25 GAUGE, Membranetomy, drainage of subretinal fluid;  Surgeon: Jalene Mullet, MD;  Location: [place];  Service: Ophthalmology;  Laterality: Right;   PARS PLANA VITRECTOMY Right 08/25/2020   Procedure: PARS PLANA VITRECTOMY WITH 25 GAUGE;  Surgeon: Jalene Mullet, MD;  Location: [place];  Service: Ophthalmology;  Laterality: Right;   PHOTOCOAGULATION WITH LASER Right 07/14/2020   Procedure: PHOTOCOAGULATION WITH LASER;  Surgeon: Jalene Mullet, MD;  Location: [place];  Service: Ophthalmology;  Laterality: Right;   PHOTOCOAGULATION WITH LASER Right 08/25/2020   Procedure: PHOTOCOAGULATION WITH LASER;  Surgeon: Jalene Mullet, MD;  Location: [place];  Service: Ophthalmology;  Laterality: Right;   REPAIR OF COMPLEX TRACTION  RETINAL DETACHMENT Right 08/25/2020   Procedure: REPAIR OF HEMORRHAGIC DETACHMENT;  Surgeon: Jalene Mullet, MD;  Location: [place];  Service: Ophthalmology;  Laterality: Right;   RIGHT/LEFT HEART CATH AND CORONARY ANGIOGRAPHY N/A 01/31/2017   Procedure: Right/Left Heart Cath and Coronary Angiography;  Surgeon: Troy Sine, MD;  Location: [place] [facility];  Service: Cardiovascular;  Laterality: N/A;   SILICON OIL REMOVAL Right 9/62/2297   Procedure: SILICON OIL REMOVAL;  Surgeon: Jalene Mullet, MD;  Location: [place];  Service: Ophthalmology;  Laterality: Right;    Current Outpatient Medications  Medication Sig Dispense Refill   Accu-Chek Softclix Lancets lancets Use to check blood sugar three times daily E11.65 100 each 5   albuterol (VENTOLIN HFA) 108 (90 Base) MCG/ACT inhaler Inhale 1 to 2 puffs by mouth into the lungs every 6 hours as needed for wheezing or shortness of breath. 18 g 0   amLODipine (NORVASC) 10 MG tablet Take 1 tablet (10 mg total) by mouth daily. 30 tablet 0    aspirin 81 MG EC tablet Take 1 tablet (81 mg total) by mouth daily. Swallow whole. 30 tablet 2   Blood Glucose Monitoring Suppl (ACCU-CHEK GUIDE) w/Device KIT Use to check blood sugar three times daily E11.65 1 kit 0   carvedilol (COREG) 12.5 MG tablet TAKE 1 TABLET (12.5 MG TOTAL) BY MOUTH 2 (TWO) TIMES DAILY WITH A MEAL. 60 tablet 6   cetirizine (ZYRTEC) 10 MG tablet TAKE 1 TABLET (10 MG TOTAL) BY MOUTH DAILY. 30 tablet 0   Continuous Blood Gluc Receiver (FREESTYLE LIBRE 2 READER) DEVI Use as directed four to five times daily. 1 each 0   Continuous Blood Gluc Sensor (FREESTYLE LIBRE 2 SENSOR) MISC Use as directed, to montior blood sugar 4 to 5 times a day daily. 2 each 11   Elastic Bandages & Supports (T.E.D. KNEE LENGTH/S-LONG) MISC 1 (one) each daily, apply compression stockings daily to help decrease leg swelling 1 each 0   ferrous sulfate 325 (65 FE) MG tablet Take 1 tablet (325 mg total) by mouth 2 (two) times daily with a meal. 60 tablet 3   furosemide (LASIX) 40 MG tablet Take 1 tablet (40 mg total) by mouth 2 (two) times daily. 60 tablet 6   gabapentin (NEURONTIN) 300 MG capsule Take 1 capsule (300 mg total) by mouth at bedtime. 60 capsule 6   glucose blood (ACCU-CHEK GUIDE) test strip Use to check blood sugar three times daily E11.65 100 each 12   hydrALAZINE (APRESOLINE) 100 MG tablet Take 1 tablet (100 mg total) by mouth 3 (three) times daily. 90 tablet 0   insulin aspart (NOVOLOG FLEXPEN) 100 UNIT/ML FlexPen Inject 0-9 Units into the skin See admin instructions. Inject 0-9 units into the skin 3 times a day with meals, PER SLIDING SCALE:  CBG 70 - 120 =  0 unit CBG 121 - 150: 1 unit  CBG 151 - 200: 2 units  CBG 201 - 250: 3 units  CBG 251 - 300: 5 units  CBG 301 - 350: 7 units  CBG 351 - 400 9 units (Patient not taking: Reported on 04/12/2022) 3 mL 0   Insulin Glargine Solostar (LANTUS) 100 UNIT/ML Solostar Pen Inject 20 units into the skin nightly at bedtime 18 mL 3   Insulin  Glargine Solostar (LANTUS) 100 UNIT/ML Solostar Pen inject 20 units into the skin nightly 18 mL 3   Insulin Glargine Solostar (LANTUS) 100 UNIT/ML Solostar Pen Inject 20 units into the skin nighly  at bedtime. 30 mL 6   Insulin Pen Needle (BD PEN NEEDLE NANO U/F) 32G X 4 MM MISC USE TO INJECT LANTUS DAILY. MUST USE NEW PEN NEEDLE WITH EACH INJECTION. 100 each 0   isosorbide mononitrate (IMDUR) 30 MG 24 hr tablet Take 1 tablet (30 mg total) by mouth daily. 30 tablet 6   LANTUS SOLOSTAR 100 UNIT/ML Solostar Pen Inject 20 Units into the skin daily. 30 mL 6   lisinopril (ZESTRIL) 10 MG tablet Take 10 mg by mouth daily.     ondansetron (ZOFRAN) 4 MG tablet Take 1 tablet (4 mg total) by mouth every 6 (six) hours as needed for nausea or vomiting. 12 tablet 0   potassium chloride SA (KLOR-CON M) 20 MEQ tablet Take 1 tablet (20 mEq total) by mouth 2 (two) times daily. 180 tablet 3   rosuvastatin (CRESTOR) 40 MG tablet TAKE 1 TABLET (40 MG TOTAL) BY MOUTH DAILY. 90 tablet 1   tamsulosin (FLOMAX) 0.4 MG CAPS capsule Take 1 capsule by mouth nightly, Stop if it causes dizziness (Patient not taking: Reported on 04/12/2022) 30 capsule 0   Vitamin D, Ergocalciferol, (DRISDOL) 1.25 MG (50000 UNIT) CAPS capsule Take 1 capsule by mouth weekly. 4 capsule 1   No current facility-administered medications for this visit.    Allergies:   Patient has no known allergies.   Social History:  The patient  reports that he has never smoked. He has never used smokeless tobacco. He reports that he does not drink alcohol and does not use drugs.   Family History:  The patient's family history includes Diabetes in his father, mother, and sister; Heart attack in his father and maternal grandmother; Hypertension in his mother.  ROS:  Please see the history of present illness.    All other systems are reviewed and otherwise negative.   PHYSICAL EXAM:  VS:  There were no vitals taken for this visit. BMI: There is no height or weight  on file to calculate BMI. Well nourished, well developed, in no acute distress HEENT: normocephalic, atraumatic Neck: no JVD, carotid bruits or masses Cardiac:  *** RRR; no significant murmurs, no rubs, or gallops Lungs:  *** CTA b/l, no wheezing, rhonchi or rales Abd: soft, nontender MS: no deformity or atrophy Ext: *** edema Skin: warm and dry, no rash Neuro:  No gross deficits appreciated Psych: euthymic mood, full affect  *** ICD site is stable, no tethering or discomfort   EKG:  not done today  Device interrogation done today and reviewed by myself:  ***  03/31/22: TTE  1. Left ventricular ejection fraction, by estimation, is 50 to 55%. The  left ventricle has low normal function. The left ventricle has no regional  wall motion abnormalities. There is severe concentric left ventricular  hypertrophy. Left ventricular  diastolic parameters are consistent with Grade I diastolic dysfunction  (impaired relaxation).   2. Right ventricular systolic function is normal. The right ventricular  size is mildly enlarged. There is normal pulmonary artery systolic  pressure. The estimated right ventricular systolic pressure is 78.2 mmHg.   3. The mitral valve is grossly normal. No evidence of mitral valve  regurgitation. No evidence of mitral stenosis.   4. The aortic valve is abnormal. Aortic valve regurgitation is not  visualized. Aortic valve sclerosis is present, with no evidence of aortic  valve stenosis.   Comparison(s): LVEF has improved from prior.    Echo 07/01/21: IMPRESSIONS   1. Left ventricular ejection fraction, by estimation,  is 30 to 35%. The  left ventricle has moderately decreased function. The left ventricle  demonstrates global hypokinesis. Left ventricular diastolic parameters are  consistent with Grade I diastolic  dysfunction (impaired relaxation).   2. Right ventricular systolic function is normal. The right ventricular  size is normal. There is normal  pulmonary artery systolic pressure.   3. The mitral valve is grossly normal. No evidence of mitral valve  regurgitation. No evidence of mitral stenosis.   4. The aortic valve was not well visualized. There is mild thickening of  the aortic valve. Aortic valve regurgitation is not visualized. Mild  aortic valve sclerosis is present, with no evidence of aortic valve  stenosis.   5. The inferior vena cava is normal in size with greater than 50%  respiratory variability, suggesting right atrial pressure of 3 mmHg.    01/14/18: TTE Study Conclusions - Left ventricle: The cavity size was normal. Wall thickness was   increased in a pattern of mild LVH. Systolic function was   severely reduced. The estimated ejection fraction was 20%.   Diffuse hypokinesis. Features are consistent with a pseudonormal   left ventricular filling pattern, with concomitant abnormal   relaxation and increased filling pressure (grade 2 diastolic   dysfunction). Doppler parameters are consistent with high   ventricular filling pressure. - Left atrium: The atrium was moderately dilated. - Right ventricle: The cavity size was mildly dilated. Systolic   function was moderately to severely reduced.   01/29/17: TTE w/LVEF 20-25%   01/31/17: LHC 1st RPLB lesion, 60 %stenosed. Dist RCA lesion, 60 %stenosed. Prox RCA lesion, 55 %stenosed. Prox LAD lesion, 10 %stenosed. There is severe left ventricular systolic dysfunction. LV end diastolic pressure is mildly elevated. Severe global LV dysfunction with an ejection fraction of 10-15%.  The pattern is one of a nonischemic cardiomyopathy. Mild coronary obstructive disease with smooth 10% narrowing in the LAD; normal ramus intermediate, normal left circumflex; and RCA with 50-60% proximal stenosis and distal tapering of 60%.  Prior to giving rise to 3 small distal branches with 60% narrowing in a small inferior LV branch.  The patient's LV dysfunction is out of proportion to his  CAD. RECOMMENDATION: With the patient's severe LV dysfunction, it is recommended that the patient be transferred to 4 N stepdown unit rather than return back to [facility].  Initiation of medical therapy with carvedilol, spironolactone, and probable initiation of angiotension receptorblocker/neprilysin inhibition therapy.  Consider short-term life-vest prior to discharge to allow for possible medication induced improvement in LV function.   07/31/14: TTE [facility]) LVEF 45-50% 10/01/11: [facility], TTE LVEF 55-60%  Recent Labs: 03/11/2022: TSH 0.831 03/17/2022: Magnesium 1.9 03/31/2022: ALT 18; B Natriuretic Peptide 16.3 04/01/2022: BUN 42; Creatinine, Ser 3.29; Hemoglobin 10.5; Platelets 251; Potassium 4.3; Sodium 139  07/02/2021: Cholesterol 201; HDL 36; LDL Cholesterol 120; Total CHOL/HDL Ratio 5.6; Triglycerides 225; VLDL 45   Estimated Creatinine Clearance: 27.9 mL/min (A) (by C-G formula based on SCr of 3.29 mg/dL (H)).   Wt Readings from Last 3 Encounters:  04/12/22 220 lb 6.4 oz (100 kg)  03/31/22 215 lb (97.5 kg)  03/10/22 214 lb 8.1 oz (97.3 kg)     Other studies reviewed: Additional studies/records reviewed today include: summarized above  ASSESSMENT AND PLAN:  ICD *** Intact function *** No programming changes made  NICM Chronic CHF *** Renal function has limited meds it seems *** Currently appears euvolemic by exam and OptiVol ***  HTN ***   Disposition: ***  Current medicines are reviewed at length with the patient today.  The patient did not have any concerns regarding medicines.  Venetia Night, PA-C 04/13/2022 1:09 PM     [address] [place] [phone] (office)  [phone] (fax)

## 2022-04-14 ENCOUNTER — Telehealth: Payer: Self-pay | Admitting: Nurse Practitioner

## 2022-04-14 NOTE — Telephone Encounter (Signed)
Copied from [place] [phone]. Topic: Quick Communication - Home Health Verbal Orders ?>> Apr 13, 2022  3:53 PM Tessa Lerner A wrote: ?Caller/Agency: [facility]  ?Callback Number: [phone] ?Requesting OT/PT/Skilled Nursing/Social Work/Speech Therapy: PT ?Frequency: 1w1 2w3 1w3 ?

## 2022-04-15 ENCOUNTER — Telehealth: Payer: Self-pay

## 2022-04-15 ENCOUNTER — Ambulatory Visit: Payer: Medicare Other | Admitting: Physician Assistant

## 2022-04-15 ENCOUNTER — Ambulatory Visit (INDEPENDENT_AMBULATORY_CARE_PROVIDER_SITE_OTHER): Payer: Medicare Other

## 2022-04-15 DIAGNOSIS — I428 Other cardiomyopathies: Secondary | ICD-10-CM | POA: Diagnosis not present

## 2022-04-15 LAB — CUP PACEART REMOTE DEVICE CHECK
Battery Remaining Longevity: 82 mo
Battery Voltage: 2.99 V
Brady Statistic RV Percent Paced: 0.01 %
Date Time Interrogation Session: 20230505091840
HighPow Impedance: 52 Ohm
Implantable Lead Implant Date: 20190204
Implantable Lead Location: 753860
Implantable Pulse Generator Implant Date: 20190204
Lead Channel Impedance Value: 285 Ohm
Lead Channel Impedance Value: 342 Ohm
Lead Channel Pacing Threshold Amplitude: 1 V
Lead Channel Pacing Threshold Pulse Width: 0.4 ms
Lead Channel Sensing Intrinsic Amplitude: 8.375 mV
Lead Channel Sensing Intrinsic Amplitude: 8.375 mV
Lead Channel Setting Pacing Amplitude: 2.5 V
Lead Channel Setting Pacing Pulse Width: 0.4 ms
Lead Channel Setting Sensing Sensitivity: 0.3 mV

## 2022-04-15 NOTE — Telephone Encounter (Signed)
-----   Message from [place], [place] sent at 04/13/2022  2:10 PM EDT ----- ?Can you guys get this patient back in track with remotes?  He probably needs a call to get a remote done.  He has been in/out of the hospital with diabetes issues, just saw dr. Acie Fredrickson so I don't think clinically he needs to come in to the office so soon. ? ?Please and thanks! ? ?

## 2022-04-15 NOTE — Telephone Encounter (Signed)
I called the patient to see if he received his new home remote monitor. He did. Transmission received and I have gotten him on a new remote schedule. I advised the patient to keep the monitor plugged in by his bedside. If he do the monitor will work automatically. ?

## 2022-04-18 ENCOUNTER — Other Ambulatory Visit: Payer: Self-pay

## 2022-04-18 ENCOUNTER — Encounter: Payer: Self-pay | Admitting: Internal Medicine

## 2022-04-18 ENCOUNTER — Ambulatory Visit (INDEPENDENT_AMBULATORY_CARE_PROVIDER_SITE_OTHER): Payer: Medicare Other | Admitting: Internal Medicine

## 2022-04-18 VITALS — BP 130/84 | HR 78 | Ht 67.0 in | Wt 222.8 lb

## 2022-04-18 DIAGNOSIS — E1165 Type 2 diabetes mellitus with hyperglycemia: Secondary | ICD-10-CM | POA: Diagnosis not present

## 2022-04-18 DIAGNOSIS — E1159 Type 2 diabetes mellitus with other circulatory complications: Secondary | ICD-10-CM | POA: Diagnosis not present

## 2022-04-18 MED ORDER — GLUCAGON 3 MG/DOSE NA POWD
3.0000 mg | Freq: Once | NASAL | 11 refills | Status: DC | PRN
  Filled 2022-04-18: qty 1, 1d supply, fill #0

## 2022-04-18 MED ORDER — BD PEN NEEDLE NANO U/F 32G X 4 MM MISC
3 refills | Status: AC
Start: 2022-04-18 — End: ?
  Filled 2022-04-18: qty 100, fill #0
  Filled 2023-02-07: qty 100, 25d supply, fill #0

## 2022-04-18 MED ORDER — INSULIN GLARGINE SOLOSTAR 100 UNIT/ML ~~LOC~~ SOPN
12.0000 [IU] | PEN_INJECTOR | Freq: Every day | SUBCUTANEOUS | 3 refills | Status: DC
  Filled 2022-04-18: qty 12, 85d supply, fill #0
  Filled 2022-05-18: qty 15, 107d supply, fill #0

## 2022-04-18 NOTE — Progress Notes (Signed)
Patient ID: Jeffrey [name], male   DOB: 22-Aug-1966, 56 y.o.   MRN: [id number] ? ?This visit occurred during the SARS-CoV-2 public health emergency.  Safety protocols were in place, including screening questions prior to the visit, additional usage of staff PPE, and extensive cleaning of exam room while observing appropriate contact time as indicated for disinfecting solutions.  ? ?HPI: ?Jeffrey [name] is a 56 y.o.-year-old male, referred by his PCP, Dr. Evie Lacks, for management of DM2, dx in ~2013, insulin-dependent  since dx., uncontrolled, with long-term complications (CAD, diastolic and systolic CHF, CVAs, CKD stage 4, peripheral neuropathy - s/p 2 R toe amputations, DR, recurring hypoglycemia).  He is here with his wife who offers most of the information about his diabetes history, other medical conditions, diet, and activity, along with blood sugars and insulin doses.  Patient is somnolent and dozes off during the appointment. ? ?Patient missed his previous appointment with me due to another episode of hypoglycemia. ?He was admitted with hypoglycemia-induced encephalopathy (blood sugar 40) 03/10/2022.  At that time he was unresponsive ?He was admitted with hypoglycemia (blood sugar 47) 03/31/2022. ? ?Reviewed HbA1c: ?Lab Results  ?Component Value Date  ? HGBA1C 9.7 (H) 03/11/2022  ? HGBA1C 7.6 (A) 10/20/2021  ? HGBA1C 14.2 (H) 06/30/2021  ? HGBA1C 10.7 (A) 01/13/2021  ? HGBA1C 15.4 (H) 06/13/2020  ? HGBA1C 9.9 (H) 04/25/2019  ? HGBA1C 7.2 (A) 10/24/2018  ? HGBA1C >15.5 (H) 06/22/2018  ? HGBA1C 13.2 03/21/2018  ? HGBA1C 13.8 12/20/2017  ? HGBA1C 11.8 (H) 01/29/2017  ? HGBA1C 12.4 (H) 09/12/2016  ? HGBA1C 13.0 (H) 06/18/2016  ? HGBA1C 13.0 (H) 06/17/2016  ? HGBA1C 13.7 02/24/2016  ? HGBA1C 13.4 (H) 09/28/2015  ? HGBA1C 13.4 (H) 09/25/2015  ? HGBA1C 13.90 06/04/2015  ? HGBA1C 9.60 03/04/2015  ? HGBA1C 12.9 11/12/2014  ? HGBA1C 9.9 08/26/2014  ? HGBA1C >14% 04/21/2014  ? ?Pt is on a regimen of: ?- Lantus 35 >> 25  >> 20 units at bedtime (decreased 03/31/2022) ?-  (stopped 03/31/2022) ? ?Pt checks his sugars >4x a day with the Freestyle Libre 2, which he obtained approximately 2 weeks ago: ? ? ?Lowest sugar was 47; he has hypoglycemia awareness at 60.  ?Highest sugar was 300. ? ?Glucometer: Accu-Chek guide ? ?Pt's meals are: ?- Breakfast: grits, scrambled eggs and bacon or cereal or oatmeal ?- Lunch: PB and jelly; sandwich; soup ?- Dinner: same; rotisserie chicken ?- Snacks: seldom; grapes;  ? ?- + stage 4 CKD, last BUN/creatinine:  ?Lab Results  ?Component Value Date  ? BUN 42 (H) 04/01/2022  ? BUN 37 (H) 03/31/2022  ? CREATININE 3.29 (H) 04/01/2022  ? CREATININE 2.98 (H) 03/31/2022  ?He is on lisinopril 10 mg daily. ? ?- + HL; last set of lipids: ?Lab Results  ?Component Value Date  ? CHOL 201 (H) 07/02/2021  ? HDL 36 (L) 07/02/2021  ? LDLCALC 120 (H) 07/02/2021  ? TRIG 225 (H) 07/02/2021  ? CHOLHDL 5.6 07/02/2021  ?On Crestor 40 mg daily. ? ?- last eye exam was in 03/2022. + DR. Dr. Wyatt Portela. 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 Surgeon: Dr. Posey Pronto. Has cataract. ? ?- no numbness and tingling in his feet.  Last foot exam 10/20/2021. ? ?He is on ASA 81. ? ?Pt has FH of DM in M, F, 1 B, 3 S's. ? ?He is on iron po. ? ?No personal history of pancreatitis or family history of medullary thyroid cancer or multiple endocrine neoplasia syndromes. ? ?ROS: ?  Constitutional: + weight gain, + fatigue, + feeling both hot and cold, + poor sleep, + nocturia, + excessive urination ?Eyes: + blurry vision, no xerophthalmia ?ENT: no sore throat, no dysphagia/odynophagia, no hoarseness ?Cardiovascular: + CP/+ SOB/no palpitations/+ leg swelling ?Respiratory: + cough/+ SOB/+ wheezing ?Gastrointestinal: + N/+ V/D/+ C, + heartburn ?Musculoskeletal: + muscle aches/+ joint aches ?Skin: no rashes, + itching ?Neurological: no tremors/numbness/tingling/dizziness, + HA ?Psychiatric: + both: depression/anxiety ?+ Difficulty with erections ?+ Seizures ? ?Past Medical  History:  ?Diagnosis Date  ? AICD (automatic cardioverter/defibrillator) present   ? Medtronic  ? AICD (automatic cardioverter/defibrillator) present   ? MDT Visia AF MRI  ? Anemia   ? CAD (coronary artery disease)   ? a. cath 01/31/17: 60% 1st RPLB, 60% dist RCA, 55% prox RCA, 10% pro LAD --> Rx TX.   ? CHF (congestive heart failure) ([place])   ? Chronic systolic CHF (congestive heart failure) ([place]) 01/28/2017  ? 1. Echo 01/29/17:  EF 20-25, normal wall motion, mild LAE // 2. EF 10-15 by [facility] 01/2017   ? Coronary artery disease   ? Diabetes mellitus   ? type II  ? Diabetes mellitus without complication ([place])   ? Diabetic foot infection ([place]) 03/2016  ? RT FOOT  ? Dyspnea   ? History of kidney stones   ? passed  ? History of kidney stones   ? HTN (hypertension)   ? Hyperlipidemia   ? Hypertension   ? Myocardial infarction [facility]), although reported, NICM at cath   ? NICM (nonischemic cardiomyopathy) ([place]) 02/15/2017  ? 1. Mod non-obs CAD on LHC in 01/2017 - CAD does not explain cardiomyopathy  ? Renal disorder   ? ?Past Surgical History:  ?Procedure Laterality Date  ? AIR/FLUID EXCHANGE Right 07/14/2020  ? Procedure: AIR/FLUID EXCHANGE;  Surgeon: Jalene Mullet, MD;  Location: [place];  Service: Ophthalmology;  Laterality: Right;  ? AMPUTATION Right 04/01/2016  ? Procedure: Right Great Toe Amputation;  Surgeon: Newt Minion, MD;  Location: [place];  Service: Orthopedics;  Laterality: Right;  ? AMPUTATION Right 06/19/2016  ? Procedure: AMPUTATION SECOND TOE;  Surgeon: Marybelle Killings, MD;  Location: [place];  Service: Orthopedics;  Laterality: Right;  ? BACK SURGERY    ? for abscess  ? CARDIAC CATHETERIZATION    ? ICD IMPLANT N/A 01/15/2018  ? Procedure: ICD IMPLANT;  Surgeon: Deboraha Sprang, MD;  Location: [place] [facility];  Service: Cardiovascular;  Laterality: N/A;  ? INJECTION OF SILICONE OIL Right 12/19/5629  ? Procedure: INJECTION OF SILICONE OIL;  Surgeon: Jalene Mullet, MD;  Location: [place];  Service: Ophthalmology;  Laterality:  Right;  ? INJECTION OF SILICONE OIL Right 4/97/0263  ? Procedure: INJECTION OF SILICONE OIL;  Surgeon: Jalene Mullet, MD;  Location: [place];  Service: Ophthalmology;  Laterality: Right;  ? INSERT / REPLACE / REMOVE PACEMAKER    ? MEMBRANE PEEL Right 08/25/2020  ? Procedure: MEMBRANE PEEL;  Surgeon: Jalene Mullet, MD;  Location: [place];  Service: Ophthalmology;  Laterality: Right;  ? PARS PLANA VITRECTOMY Right 07/14/2020  ? Procedure: PARS PLANA VITRECTOMY WITH 25 GAUGE, Membranetomy, drainage of subretinal fluid;  Surgeon: Jalene Mullet, MD;  Location: [place];  Service: Ophthalmology;  Laterality: Right;  ? PARS PLANA VITRECTOMY Right 08/25/2020  ? Procedure: PARS PLANA VITRECTOMY WITH 25 GAUGE;  Surgeon: Jalene Mullet, MD;  Location: [place];  Service: Ophthalmology;  Laterality: Right;  ? PHOTOCOAGULATION WITH LASER Right 07/14/2020  ? Procedure: PHOTOCOAGULATION WITH LASER;  Surgeon: Jalene Mullet, MD;  Location: [place];  Service: Ophthalmology;  Laterality: Right;  ? PHOTOCOAGULATION WITH LASER Right 08/25/2020  ? Procedure: PHOTOCOAGULATION WITH LASER;  Surgeon: Jalene Mullet, MD;  Location: [place];  Service: Ophthalmology;  Laterality: Right;  ? 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  ? Procedure: REPAIR OF HEMORRHAGIC DETACHMENT;  Surgeon: Jalene Mullet, MD;  Location: [place];  Service: Ophthalmology;  Laterality: Right;  ? RIGHT/LEFT HEART CATH AND CORONARY ANGIOGRAPHY N/A 01/31/2017  ? Procedure: Right/Left Heart Cath and Coronary Angiography;  Surgeon: Troy Sine, MD;  Location: [place] [facility];  Service: Cardiovascular;  Laterality: N/A;  ? SILICON OIL REMOVAL Right 08/25/2020  ? Procedure: SILICON OIL REMOVAL;  Surgeon: Jalene Mullet, MD;  Location: [place];  Service: Ophthalmology;  Laterality: Right;  ? ?Social History  ? ?Socioeconomic History  ? Marital status: Significant Other  ?  Spouse name: Significant other [name]  ? Number of children: 1  ? Years of education: Not  on file  ? Highest education level: Not on file  ?Occupational History  ? Not on file  ?Tobacco Use  ? Smoking status: Never  ? Smokeless tobacco: Never  ?Vaping Use  ? Vaping Use: Never used  ?Substance and

## 2022-04-18 NOTE — Patient Instructions (Addendum)
Please decrease: ?- Lantus 12-14 units and move it to am  ? ?Please start : ?- Ozempic 0.25 mg weekly in a.m. (for example on Sunday morning) x 4 weeks, then increase to 0.5 mg weekly in a.m. if no nausea or hypoglycemia. ? ?Please return in 2 months. ? ?PATIENT INSTRUCTIONS FOR TYPE 2 DIABETES: ? ?**Please join MyChart!** - see attached instructions about how to join if you have not done so already. ? ?DIET AND EXERCISE ?Diet and exercise is an important part of diabetic treatment.  ?We recommended aerobic exercise in the form of brisk walking (working between 40-60% of maximal aerobic capacity, similar to brisk walking) for 150 minutes per week (such as 30 minutes five days per week) along with 3 times per week performing 'resistance' training (using various gauge rubber tubes with handles) 5-10 exercises involving the major muscle groups (upper body, lower body and core) performing 10-15 repetitions (or near fatigue) each exercise. Start at half the above goal but build slowly to reach the above goals. If limited by weight, joint pain, or disability, we recommend daily walking in a swimming pool with water up to waist to reduce pressure from joints while allow for adequate exercise.   ? ?BLOOD GLUCOSES ?Monitoring your blood glucoses is important for continued management of your diabetes. Please check your blood glucoses 2-4 times a day: fasting, before meals and at bedtime (you can rotate these measurements - e.g. one day check before the 3 meals, the next day check before 2 of the meals and before bedtime, etc.).  ? ?HYPOGLYCEMIA (low blood sugar) ?Hypoglycemia is usually a reaction to not eating, exercising, or taking too much insulin/ other diabetes drugs.  ?Symptoms include tremors, sweating, hunger, confusion, headache, etc. ?Treat IMMEDIATELY with 15 grams of Carbs: ?4 glucose tablets ?? cup regular juice/soda ?2 tablespoons raisins ?4 teaspoons sugar ?1 tablespoon honey ?Recheck blood glucose in 15 mins  and repeat above if still symptomatic/blood glucose <100. ? ?RECOMMENDATIONS TO REDUCE YOUR RISK OF DIABETIC COMPLICATIONS: ?* Take your prescribed MEDICATION(S) ?* Follow a DIABETIC diet: Complex carbs, fiber rich foods, (monounsaturated and polyunsaturated) fats ?* AVOID saturated/trans fats, high fat foods, >2,300 mg salt per day. ?* EXERCISE at least 5 times a week for 30 minutes or preferably daily.  ?* DO NOT SMOKE OR DRINK more than 1 drink a day. ?* Check your FEET every day. Do not wear tightfitting shoes. Contact us if you develop an ulcer ?* See your EYE doctor once a year or more if needed ?* Get a FLU shot once a year ?* Get a PNEUMONIA vaccine once before and once after age 69 years ? ?GOALS:  ?* Your Hemoglobin A1c of <7%  ?* fasting sugars need to be <130 ?* after meals sugars need to be <180 (2h after you start eating) ?* Your Systolic BP should be 240 or lower  ?* Your Diastolic BP should be 80 or lower  ?* Your HDL (Good Cholesterol) should be 40 or higher  ?* Your LDL (Bad Cholesterol) should be 100 or lower. ?* Your Triglycerides should be 150 or lower  ?* Your Urine microalbumin (kidney function) should be <30 ?* Your Body Mass Index should be 25 or lower  ? ?Please consider the following ways to cut down carbs and fat and increase fiber and micronutrients in your diet: ?- substitute whole grain for white bread or pasta ?- substitute brown rice for white rice ?- substitute 90-calorie flat bread pieces for slices of bread  when possible ?- substitute sweet potatoes or yams for white potatoes ?- substitute humus for margarine ?- substitute tofu for cheese when possible ?- substitute almond or rice milk for regular milk (would not drink soy milk daily due to concern for soy estrogen influence on breast cancer risk) ?- substitute dark chocolate for other sweets when possible ?- substitute water - can add lemon or orange slices for taste - for diet sodas (artificial sweeteners will trick your body  that you can eat sweets without getting calories and will lead you to overeating and weight gain in the long run) ?- do not skip breakfast or other meals (this will slow down the metabolism and will result in more weight gain over time)  ?- can try smoothies made from fruit and almond/rice milk in am instead of regular breakfast ?- can also try old-fashioned (not instant) oatmeal made with almond/rice milk in am ?- order the dressing on the side when eating salad at a restaurant (pour less than half of the dressing on the salad) ?- eat as little meat as possible ?- can try juicing, but should not forget that juicing will get rid of the fiber, so would alternate with eating raw veg./fruits or drinking smoothies ?- use as little oil as possible, even when using olive oil - can dress a salad with a mix of balsamic vinegar and lemon juice, for e.g. ?- use agave nectar, stevia sugar, or regular sugar rather than artificial sweateners ?- steam or broil/roast veggies  ?- snack on veggies/fruit/nuts (unsalted, preferably) when possible, rather than processed foods ?- reduce or eliminate aspartame in diet (it is in diet sodas, chewing gum, etc) ?Read the labels! ? ?Try to read Dr. Janene Harvey book: "Program for Reversing Diabetes" for other ideas for healthy eating. ? ? ? ? ? ?

## 2022-04-19 ENCOUNTER — Other Ambulatory Visit: Payer: Self-pay

## 2022-04-20 ENCOUNTER — Other Ambulatory Visit (HOSPITAL_COMMUNITY): Payer: Self-pay

## 2022-04-20 ENCOUNTER — Other Ambulatory Visit: Payer: Self-pay

## 2022-04-22 ENCOUNTER — Other Ambulatory Visit: Payer: Self-pay

## 2022-04-22 MED ORDER — OZEMPIC (0.25 OR 0.5 MG/DOSE) 2 MG/1.5ML ~~LOC~~ SOPN
PEN_INJECTOR | SUBCUTANEOUS | 3 refills | Status: DC
Start: 2022-04-21 — End: 2022-04-22
  Filled 2022-04-22: qty 1.5, 30d supply, fill #0

## 2022-04-22 MED ORDER — OZEMPIC (0.25 OR 0.5 MG/DOSE) 2 MG/3ML ~~LOC~~ SOPN
PEN_INJECTOR | SUBCUTANEOUS | 3 refills | Status: DC
  Filled 2022-04-22: qty 3, 42d supply, fill #0

## 2022-04-26 ENCOUNTER — Other Ambulatory Visit: Payer: Self-pay

## 2022-04-27 ENCOUNTER — Other Ambulatory Visit: Payer: Self-pay

## 2022-04-27 MED ORDER — LISINOPRIL 10 MG PO TABS
ORAL_TABLET | ORAL | 3 refills | Status: DC
  Filled 2022-04-27: qty 90, 90d supply, fill #0

## 2022-04-27 MED ORDER — FUROSEMIDE 40 MG PO TABS
ORAL_TABLET | ORAL | 3 refills | Status: DC
  Filled 2022-04-27: qty 180, 90d supply, fill #0

## 2022-04-27 MED ORDER — GABAPENTIN 300 MG PO CAPS
ORAL_CAPSULE | ORAL | 3 refills | Status: DC
  Filled 2022-04-27: qty 180, 90d supply, fill #0
  Filled 2023-03-17 – 2023-04-26 (×2): qty 180, 90d supply, fill #1

## 2022-04-27 MED ORDER — POTASSIUM CHLORIDE CRYS ER 20 MEQ PO TBCR
EXTENDED_RELEASE_TABLET | ORAL | 3 refills | Status: DC
  Filled 2022-04-27: qty 180, 90d supply, fill #0

## 2022-04-27 MED ORDER — HYDRALAZINE HCL 25 MG PO TABS
ORAL_TABLET | ORAL | 3 refills | Status: DC
  Filled 2022-04-27: qty 270, 90d supply, fill #0

## 2022-04-27 MED ORDER — TAMSULOSIN HCL 0.4 MG PO CAPS
ORAL_CAPSULE | ORAL | 3 refills | Status: DC
Start: 2022-04-27 — End: 2023-05-12
  Filled 2022-04-27: qty 90, 90d supply, fill #0
  Filled 2023-01-25: qty 90, 90d supply, fill #1
  Filled 2023-04-19 – 2023-04-27 (×2): qty 90, 90d supply, fill #2

## 2022-04-28 ENCOUNTER — Other Ambulatory Visit: Payer: Self-pay

## 2022-04-28 ENCOUNTER — Telehealth: Payer: Self-pay | Admitting: *Deleted

## 2022-04-28 NOTE — Progress Notes (Signed)
Remote ICD transmission.   

## 2022-04-28 NOTE — Telephone Encounter (Signed)
[facility] Calling to report elevated BP.  Per patient chart patient is no longer current at [facility]: Message per Eden Lathe on 04/04/2022-   Transition Care Management Follow-up Telephone Call Date of discharge and from where: 04/01/2022, [facility]  I spoke to patient and then his wife, [name]. She said that he is doing okay and has transferred PCP to [facility].    Informed [facility], VO were not approved. Would need to reach out to [facility] or patient and wife. Left this message on voicemail.    Current provider information given so appropriate provider can be contacted

## 2022-05-03 ENCOUNTER — Ambulatory Visit: Payer: Medicare Other | Admitting: Student

## 2022-05-03 DIAGNOSIS — I2510 Atherosclerotic heart disease of native coronary artery without angina pectoris: Secondary | ICD-10-CM

## 2022-05-03 DIAGNOSIS — I5042 Chronic combined systolic (congestive) and diastolic (congestive) heart failure: Secondary | ICD-10-CM

## 2022-05-03 DIAGNOSIS — I10 Essential (primary) hypertension: Secondary | ICD-10-CM

## 2022-05-10 NOTE — Progress Notes (Deleted)
Electrophysiology Office Note Date: 05/10/2022  ID:  Jeffrey [name], DOB 05-10-1966, MRN [id number]  PCP: Willene Hatchet, NP Primary Cardiologist: Mertie Moores, MD Electrophysiologist: Virl Axe, MD   CC: Routine ICD follow-up  Jeffrey [name] is a 56 y.o. male seen today for Virl Axe, MD for routine electrophysiology followup.    Saw Dr. Caryl Comes 03/09/2022 and device titrated.   Did have admission for ARF 03/10/2022 and Hypoglycemia 03/31/2022.  Since last being seen in our clinic the patient reports doing well. BP remains elevated ~ 620 systolic. BPs runs 120-130s at home. Girlfriend reports she is strict about making sure he gets his medication. He has SOB with mild exertion and is being seen by research today for further work up for [employer]. He has not had ICD shocks.   Device History: Medtronic Single Chamber ICD implanted 01/2018 for NICM and syncope History of appropriate therapy: No History of AAD therapy: No   Past Medical History:  Diagnosis Date   AICD (automatic cardioverter/defibrillator) present    Medtronic   AICD (automatic cardioverter/defibrillator) present    MDT Visia AF MRI   Anemia    CAD (coronary artery disease)    a. cath 01/31/17: 60% 1st RPLB, 60% dist RCA, 55% prox RCA, 10% pro LAD --> Rx TX.    CHF (congestive heart failure) (HCC)    Chronic systolic CHF (congestive heart failure) ([place]) 01/28/2017   1. Echo 01/29/17:  EF 20-25, normal wall motion, mild LAE // 2. EF 10-15 by [facility] 01/2017    Coronary artery disease    Diabetes mellitus    type II   Diabetes mellitus without complication ([place])    Diabetic foot infection ([place]) 03/2016   RT FOOT   Dyspnea    History of kidney stones    passed   History of kidney stones    HTN (hypertension)    Hyperlipidemia    Hypertension    Myocardial infarction [facility]), although reported, NICM at cath    NICM (nonischemic cardiomyopathy) ([place]) 02/15/2017   1. Mod non-obs CAD on LHC in 01/2017 - CAD  does not explain cardiomyopathy   Renal disorder    Past Surgical History:  Procedure Laterality Date   AIR/FLUID EXCHANGE Right 07/14/2020   Procedure: AIR/FLUID EXCHANGE;  Surgeon: Jalene Mullet, MD;  Location: [place];  Service: Ophthalmology;  Laterality: Right;   AMPUTATION Right 04/01/2016   Procedure: Right Great Toe Amputation;  Surgeon: Newt Minion, MD;  Location: [place];  Service: Orthopedics;  Laterality: Right;   AMPUTATION Right 06/19/2016   Procedure: AMPUTATION SECOND TOE;  Surgeon: Marybelle Killings, MD;  Location: [place];  Service: Orthopedics;  Laterality: Right;   BACK SURGERY     for abscess   CARDIAC CATHETERIZATION     ICD IMPLANT N/A 01/15/2018   Procedure: ICD IMPLANT;  Surgeon: Deboraha Sprang, MD;  Location: [place] [facility];  Service: Cardiovascular;  Laterality: N/A;   INJECTION OF SILICONE OIL Right 02/13/5973   Procedure: INJECTION OF SILICONE OIL;  Surgeon: Jalene Mullet, MD;  Location: [place];  Service: Ophthalmology;  Laterality: Right;   INJECTION OF SILICONE OIL Right 1/63/8453   Procedure: INJECTION OF SILICONE OIL;  Surgeon: Jalene Mullet, MD;  Location: [place];  Service: Ophthalmology;  Laterality: Right;   INSERT / REPLACE / REMOVE PACEMAKER     MEMBRANE PEEL Right 08/25/2020   Procedure: MEMBRANE PEEL;  Surgeon: Jalene Mullet, MD;  Location: [place];  Service:  Ophthalmology;  Laterality: Right;   PARS PLANA VITRECTOMY Right 07/14/2020   Procedure: PARS PLANA VITRECTOMY WITH 25 GAUGE, Membranetomy, drainage of subretinal fluid;  Surgeon: Jalene Mullet, MD;  Location: [place];  Service: Ophthalmology;  Laterality: Right;   PARS PLANA VITRECTOMY Right 08/25/2020   Procedure: PARS PLANA VITRECTOMY WITH 25 GAUGE;  Surgeon: Jalene Mullet, MD;  Location: [place];  Service: Ophthalmology;  Laterality: Right;   PHOTOCOAGULATION WITH LASER Right 07/14/2020   Procedure: PHOTOCOAGULATION WITH LASER;  Surgeon: Jalene Mullet, MD;  Location: [place];  Service: Ophthalmology;   Laterality: Right;   PHOTOCOAGULATION WITH LASER Right 08/25/2020   Procedure: PHOTOCOAGULATION WITH LASER;  Surgeon: Jalene Mullet, MD;  Location: [place];  Service: Ophthalmology;  Laterality: Right;   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   Procedure: REPAIR OF HEMORRHAGIC DETACHMENT;  Surgeon: Jalene Mullet, MD;  Location: [place];  Service: Ophthalmology;  Laterality: Right;   RIGHT/LEFT HEART CATH AND CORONARY ANGIOGRAPHY N/A 01/31/2017   Procedure: Right/Left Heart Cath and Coronary Angiography;  Surgeon: Troy Sine, MD;  Location: [place] [facility];  Service: Cardiovascular;  Laterality: N/A;   SILICON OIL REMOVAL Right 1/74/9449   Procedure: SILICON OIL REMOVAL;  Surgeon: Jalene Mullet, MD;  Location: [place];  Service: Ophthalmology;  Laterality: Right;    Current Outpatient Medications  Medication Sig Dispense Refill   Accu-Chek Softclix Lancets lancets Use to check blood sugar three times daily E11.65 100 each 5   albuterol (VENTOLIN HFA) 108 (90 Base) MCG/ACT inhaler Inhale 1 to 2 puffs by mouth into the lungs every 6 hours as needed for wheezing or shortness of breath. 18 g 0   aspirin 81 MG EC tablet Take 1 tablet (81 mg total) by mouth daily. Swallow whole. 30 tablet 2   Blood Glucose Monitoring Suppl (ACCU-CHEK GUIDE) w/Device KIT Use to check blood sugar three times daily E11.65 1 kit 0   carvedilol (COREG) 12.5 MG tablet TAKE 1 TABLET (12.5 MG TOTAL) BY MOUTH 2 (TWO) TIMES DAILY WITH A MEAL. 60 tablet 6   cetirizine (ZYRTEC) 10 MG tablet TAKE 1 TABLET (10 MG TOTAL) BY MOUTH DAILY. 30 tablet 0   Continuous Blood Gluc Receiver (FREESTYLE LIBRE 2 READER) DEVI Use as directed four to five times daily. 1 each 0   Continuous Blood Gluc Sensor (FREESTYLE LIBRE 2 SENSOR) MISC Use as directed, to montior blood sugar 4 to 5 times a day daily. 2 each 11   Elastic Bandages & Supports (T.E.D. KNEE LENGTH/S-LONG) MISC 1 (one) each daily, apply compression  stockings daily to help decrease leg swelling 1 each 0   ferrous sulfate 325 (65 FE) MG tablet Take 1 tablet (325 mg total) by mouth 2 (two) times daily with a meal. 60 tablet 3   furosemide (LASIX) 40 MG tablet Take 1 tablet (40 mg total) by mouth 2 (two) times daily. 60 tablet 6   furosemide (LASIX) 40 MG tablet TAKE 1 TABLET BY MOUTH TWICE A DAY 180 tablet 3   gabapentin (NEURONTIN) 300 MG capsule Take 1 capsule (300 mg total) by mouth at bedtime. 60 capsule 6   gabapentin (NEURONTIN) 300 MG capsule TAKE 2 CAPSULES BY MOUTH AT BEDTIME 180 capsule 3   Glucagon 3 MG/DOSE POWD Place 3 mg into the nose once as needed for up to 1 dose. 1 each 11   glucose blood (ACCU-CHEK GUIDE) test strip Use to check blood sugar three times daily E11.65 100 each 12  hydrALAZINE (APRESOLINE) 100 MG tablet Take 1 tablet (100 mg total) by mouth 3 (three) times daily. 90 tablet 0   hydrALAZINE (APRESOLINE) 25 MG tablet TAKE 1 TABLET  BY MOUTH THREE TIMES A DAY 270 tablet 3   Insulin Glargine Solostar (LANTUS) 100 UNIT/ML Solostar Pen Inject 12-14 Units into the skin daily. 15 mL 3   Insulin Pen Needle (BD PEN NEEDLE NANO U/F) 32G X 4 MM MISC USE TO INJECT LANTUS DAILY. MUST USE NEW PEN NEEDLE WITH EACH INJECTION. 100 each 3   isosorbide mononitrate (IMDUR) 30 MG 24 hr tablet Take 1 tablet (30 mg total) by mouth daily. 30 tablet 6   lisinopril (ZESTRIL) 10 MG tablet Take 10 mg by mouth daily.     lisinopril (ZESTRIL) 10 MG tablet TAKE 1 TABLET BY MOUTH ONCE A DAY 90 tablet 3   ondansetron (ZOFRAN) 4 MG tablet Take 1 tablet (4 mg total) by mouth every 6 (six) hours as needed for nausea or vomiting. 12 tablet 0   potassium chloride SA (KLOR-CON M) 20 MEQ tablet Take 1 tablet (20 mEq total) by mouth 2 (two) times daily. 180 tablet 3   potassium chloride SA (KLOR-CON M) 20 MEQ tablet TAKE 2 TABLETS BY MOUTH DAILY 180 tablet 3   rosuvastatin (CRESTOR) 40 MG tablet TAKE 1 TABLET (40 MG TOTAL) BY MOUTH DAILY. 90 tablet 1    Semaglutide,0.25 or 0.5MG/DOS, (OZEMPIC, 0.25 OR 0.5 MG/DOSE,) 2 MG/3ML SOPN Inject 0.25 mg into the skin each morning on Sunday's once per week  for 4 weeks. Then, increase to 0.5 mg if no nausea or hypoglycemia 9 mL 3   tamsulosin (FLOMAX) 0.4 MG CAPS capsule TAKE 1 CAPSULE BY MOUTH AT BEDTIME (STOP IF DIZZINESS OCCURS) 90 capsule 3   Vitamin D, Ergocalciferol, (DRISDOL) 1.25 MG (50000 UNIT) CAPS capsule Take 1 capsule by mouth weekly. 4 capsule 1   No current facility-administered medications for this visit.    Allergies:   Patient has no known allergies.   Social History: Social History   Socioeconomic History   Marital status: Significant Other    Spouse name: Significant other [name]   Number of children: 1   Years of education: Not on file   Highest education level: Not on file  Occupational History   Occupation: Disabled  Tobacco Use   Smoking status: Never   Smokeless tobacco: Never  Vaping Use   Vaping Use: Never used  Substance and Sexual Activity   Alcohol use: Never   Drug use: Never   Sexual activity: Not Currently  Other Topics Concern   Not on file  Social History Narrative   ** Merged History Encounter **       Social Determinants of Health   Financial Resource Strain: Not on file  Food Insecurity: Not on file  Transportation Needs: Not on file  Physical Activity: Not on file  Stress: Not on file  Social Connections: Not on file  Intimate Partner Violence: Not on file    Family History: Family History  Problem Relation Age of Onset   Diabetes Mother    Hypertension Mother    Diabetes Father    Heart attack Father    Diabetes Sister    Heart attack Maternal Grandmother     Review of Systems: All other systems reviewed and are otherwise negative except as noted above.   Physical Exam: There were no vitals filed for this visit.   GEN- The patient is well appearing, alert and  oriented x 3 today.   HEENT: normocephalic, atraumatic; sclera  clear, conjunctiva pink; hearing intact; oropharynx clear; neck supple, no JVP Lymph- no cervical lymphadenopathy Lungs- Clear to ausculation bilaterally, normal work of breathing.  No wheezes, rales, rhonchi Heart- Regular rate and rhythm, no murmurs, rubs or gallops, PMI not laterally displaced GI- soft, non-tender, non-distended, bowel sounds present, no hepatosplenomegaly Extremities- no clubbing or cyanosis. No edema; DP/PT/radial pulses 2+ bilaterally MS- no significant deformity or atrophy Skin- warm and dry, no rash or lesion; ICD pocket well healed Psych- euthymic mood, full affect Neuro- strength and sensation are intact  ICD interrogation- reviewed in detail today,  See PACEART report  EKG:  EKG is ordered today. The ekg ordered today shows NSR at 78 bpm, QRS 88 ms.  Recent Labs: 03/11/2022: TSH 0.831 03/17/2022: Magnesium 1.9 03/31/2022: ALT 18; B Natriuretic Peptide 16.3 04/01/2022: BUN 42; Creatinine, Ser 3.29; Hemoglobin 10.5; Platelets 251; Potassium 4.3; Sodium 139   Wt Readings from Last 3 Encounters:  04/18/22 222 lb 12.8 oz (101.1 kg)  04/12/22 220 lb 6.4 oz (100 kg)  03/31/22 215 lb (97.5 kg)     Other studies Reviewed: Additional studies/ records that were reviewed today include: Previous EP office notes. Most recent labs and echo.    Assessment and Plan:  1.  Chronic systolic dysfunction s/p Medtronic single chamber ICD  euvolemic today Stable on an appropriate medical regimen Normal ICD function See most recent Pace Art  Barostim device titrated from *** to *** without stim.   2. HTN Stable on current regimen   3. Uncontrolled Diabetes Most recent Hgb A1c was 10.7.  This may affect his candidacy for procedures. Will follow closely.  Labs/ tests ordered today include:  No orders of the defined types were placed in this encounter.  Disposition:   Follow up with EP APP 3 months, sooner if proceeding with barostim.   Jacalyn Lefevre,  PA-C  05/10/2022 10:50 AM  [facility] [address] [place] [address] [phone] (office) [phone] (fax)

## 2022-05-13 ENCOUNTER — Telehealth: Payer: Self-pay | Admitting: Nurse Practitioner

## 2022-05-13 NOTE — Telephone Encounter (Signed)
Copied from [place] [id number]. Topic: Quick Communication - Home Health Verbal Orders >> May 13, 2022  9:02 AM McGill, Nelva Bush wrote: Caller/Agency: [facility] Number:  [phone] Requesting OT/PT/Skilled Nursing/Social Work/Speech Therapy: Stated needs verbal order to move the visit from this week to next week on 6/09. Pt is not available; he is having to move.

## 2022-05-16 ENCOUNTER — Ambulatory Visit: Payer: Medicare Other | Admitting: Student

## 2022-05-16 NOTE — Telephone Encounter (Signed)
Call returned to [facility], message left with call back requested to this CM

## 2022-05-17 NOTE — Progress Notes (Deleted)
Electrophysiology Office Note Date: 05/17/2022  ID:  Jeffrey [name], DOB 1966-08-15, MRN [id number]  PCP: Estevan Oaks, NP Primary Cardiologist: Kristeen Miss, MD Electrophysiologist: Sherryl Manges, MD   CC: Routine ICD follow-up  Jeffrey [name] is a 56 y.o. male seen today for Sherryl Manges, MD for routine electrophysiology followup.    Saw Dr. Graciela Husbands 03/09/2022 and device titrated.   Did have admission for ARF 03/10/2022 and Hypoglycemia 03/31/2022.  Since last being seen in our clinic the patient reports doing well. BP remains elevated ~ 160 systolic. BPs runs 120-130s at home. Girlfriend reports she is strict about making sure he gets his medication. He has SOB with mild exertion and is being seen by research today for further work up for [employer] He has not had ICD shocks.   Device History: Medtronic Single Chamber ICD implanted 01/2018 for NICM and syncope History of appropriate therapy: No History of AAD therapy: No   Past Medical History:  Diagnosis Date   AICD (automatic cardioverter/defibrillator) present    Medtronic   AICD (automatic cardioverter/defibrillator) present    MDT Visia AF MRI   Anemia    CAD (coronary artery disease)    a. cath 01/31/17: 60% 1st RPLB, 60% dist RCA, 55% prox RCA, 10% pro LAD --> Rx TX.    CHF (congestive heart failure) (HCC)    Chronic systolic CHF (congestive heart failure) (HCC) 01/28/2017   1. Echo 01/29/17:  EF 20-25, normal wall motion, mild LAE // 2. EF 10-15 by [facility] 01/2017    Coronary artery disease    Diabetes mellitus    type II   Diabetes mellitus without complication (HCC)    Diabetic foot infection (HCC) 03/2016   RT FOOT   Dyspnea    History of kidney stones    passed   History of kidney stones    HTN (hypertension)    Hyperlipidemia    Hypertension    Myocardial infarction [facility]), although reported, NICM at cath    NICM (nonischemic cardiomyopathy) (HCC) 02/15/2017   1. Mod non-obs CAD on LHC in 01/2017 - CAD  does not explain cardiomyopathy   Renal disorder    Past Surgical History:  Procedure Laterality Date   AIR/FLUID EXCHANGE Right 07/14/2020   Procedure: AIR/FLUID EXCHANGE;  Surgeon: Carmela Rima, MD;  Location: [facility] OR;  Service: Ophthalmology;  Laterality: Right;   AMPUTATION Right 04/01/2016   Procedure: Right Great Toe Amputation;  Surgeon: Nadara Mustard, MD;  Location: [facility] OR;  Service: Orthopedics;  Laterality: Right;   AMPUTATION Right 06/19/2016   Procedure: AMPUTATION SECOND TOE;  Surgeon: Eldred Manges, MD;  Location: [facility] OR;  Service: Orthopedics;  Laterality: Right;   BACK SURGERY     for abscess   CARDIAC CATHETERIZATION     ICD IMPLANT N/A 01/15/2018   Procedure: ICD IMPLANT;  Surgeon: Duke Salvia, MD;  Location: [facility] INVASIVE CV LAB;  Service: Cardiovascular;  Laterality: N/A;   INJECTION OF SILICONE OIL Right 07/14/2020   Procedure: INJECTION OF SILICONE OIL;  Surgeon: Carmela Rima, MD;  Location: [facility] OR;  Service: Ophthalmology;  Laterality: Right;   INJECTION OF SILICONE OIL Right 08/25/2020   Procedure: INJECTION OF SILICONE OIL;  Surgeon: Carmela Rima, MD;  Location: [facility] OR;  Service: Ophthalmology;  Laterality: Right;   INSERT / REPLACE / REMOVE PACEMAKER     MEMBRANE PEEL Right 08/25/2020   Procedure: MEMBRANE PEEL;  Surgeon: Carmela Rima, MD;  Location: [facility] OR;  Service:  Ophthalmology;  Laterality: Right;   PARS PLANA VITRECTOMY Right 07/14/2020   Procedure: PARS PLANA VITRECTOMY WITH 25 GAUGE, Membranetomy, drainage of subretinal fluid;  Surgeon: Jalene Mullet, MD;  Location: [place];  Service: Ophthalmology;  Laterality: Right;   PARS PLANA VITRECTOMY Right 08/25/2020   Procedure: PARS PLANA VITRECTOMY WITH 25 GAUGE;  Surgeon: Jalene Mullet, MD;  Location: [place];  Service: Ophthalmology;  Laterality: Right;   PHOTOCOAGULATION WITH LASER Right 07/14/2020   Procedure: PHOTOCOAGULATION WITH LASER;  Surgeon: Jalene Mullet, MD;  Location: [place];  Service: Ophthalmology;   Laterality: Right;   PHOTOCOAGULATION WITH LASER Right 08/25/2020   Procedure: PHOTOCOAGULATION WITH LASER;  Surgeon: Jalene Mullet, MD;  Location: [place];  Service: Ophthalmology;  Laterality: Right;   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   Procedure: REPAIR OF HEMORRHAGIC DETACHMENT;  Surgeon: Jalene Mullet, MD;  Location: [place];  Service: Ophthalmology;  Laterality: Right;   RIGHT/LEFT HEART CATH AND CORONARY ANGIOGRAPHY N/A 01/31/2017   Procedure: Right/Left Heart Cath and Coronary Angiography;  Surgeon: Troy Sine, MD;  Location: [place] CV LAB;  Service: Cardiovascular;  Laterality: N/A;   SILICON OIL REMOVAL Right 1/74/9449   Procedure: SILICON OIL REMOVAL;  Surgeon: Jalene Mullet, MD;  Location: [place];  Service: Ophthalmology;  Laterality: Right;    Current Outpatient Medications  Medication Sig Dispense Refill   Accu-Chek Softclix Lancets lancets Use to check blood sugar three times daily E11.65 100 each 5   albuterol (VENTOLIN HFA) 108 (90 Base) MCG/ACT inhaler Inhale 1 to 2 puffs by mouth into the lungs every 6 hours as needed for wheezing or shortness of breath. 18 g 0   aspirin 81 MG EC tablet Take 1 tablet (81 mg total) by mouth daily. Swallow whole. 30 tablet 2   Blood Glucose Monitoring Suppl (ACCU-CHEK GUIDE) w/Device KIT Use to check blood sugar three times daily E11.65 1 kit 0   carvedilol (COREG) 12.5 MG tablet TAKE 1 TABLET (12.5 MG TOTAL) BY MOUTH 2 (TWO) TIMES DAILY WITH A MEAL. 60 tablet 6   cetirizine (ZYRTEC) 10 MG tablet TAKE 1 TABLET (10 MG TOTAL) BY MOUTH DAILY. 30 tablet 0   Continuous Blood Gluc Receiver (FREESTYLE LIBRE 2 READER) DEVI Use as directed four to five times daily. 1 each 0   Continuous Blood Gluc Sensor (FREESTYLE LIBRE 2 SENSOR) MISC Use as directed, to montior blood sugar 4 to 5 times a day daily. 2 each 11   Elastic Bandages & Supports (T.E.D. KNEE LENGTH/S-LONG) MISC 1 (one) each daily, apply compression  stockings daily to help decrease leg swelling 1 each 0   ferrous sulfate 325 (65 FE) MG tablet Take 1 tablet (325 mg total) by mouth 2 (two) times daily with a meal. 60 tablet 3   furosemide (LASIX) 40 MG tablet Take 1 tablet (40 mg total) by mouth 2 (two) times daily. 60 tablet 6   furosemide (LASIX) 40 MG tablet TAKE 1 TABLET BY MOUTH TWICE A DAY 180 tablet 3   gabapentin (NEURONTIN) 300 MG capsule Take 1 capsule (300 mg total) by mouth at bedtime. 60 capsule 6   gabapentin (NEURONTIN) 300 MG capsule TAKE 2 CAPSULES BY MOUTH AT BEDTIME 180 capsule 3   Glucagon 3 MG/DOSE POWD Place 3 mg into the nose once as needed for up to 1 dose. 1 each 11   glucose blood (ACCU-CHEK GUIDE) test strip Use to check blood sugar three times daily E11.65 100 each 12  hydrALAZINE (APRESOLINE) 100 MG tablet Take 1 tablet (100 mg total) by mouth 3 (three) times daily. 90 tablet 0   hydrALAZINE (APRESOLINE) 25 MG tablet TAKE 1 TABLET  BY MOUTH THREE TIMES A DAY 270 tablet 3   Insulin Glargine Solostar (LANTUS) 100 UNIT/ML Solostar Pen Inject 12-14 Units into the skin daily. 15 mL 3   Insulin Pen Needle (BD PEN NEEDLE NANO U/F) 32G X 4 MM MISC USE TO INJECT LANTUS DAILY. MUST USE NEW PEN NEEDLE WITH EACH INJECTION. 100 each 3   isosorbide mononitrate (IMDUR) 30 MG 24 hr tablet Take 1 tablet (30 mg total) by mouth daily. 30 tablet 6   lisinopril (ZESTRIL) 10 MG tablet Take 10 mg by mouth daily.     lisinopril (ZESTRIL) 10 MG tablet TAKE 1 TABLET BY MOUTH ONCE A DAY 90 tablet 3   ondansetron (ZOFRAN) 4 MG tablet Take 1 tablet (4 mg total) by mouth every 6 (six) hours as needed for nausea or vomiting. 12 tablet 0   potassium chloride SA (KLOR-CON M) 20 MEQ tablet Take 1 tablet (20 mEq total) by mouth 2 (two) times daily. 180 tablet 3   potassium chloride SA (KLOR-CON M) 20 MEQ tablet TAKE 2 TABLETS BY MOUTH DAILY 180 tablet 3   rosuvastatin (CRESTOR) 40 MG tablet TAKE 1 TABLET (40 MG TOTAL) BY MOUTH DAILY. 90 tablet 1    Semaglutide,0.25 or 0.5MG/DOS, (OZEMPIC, 0.25 OR 0.5 MG/DOSE,) 2 MG/3ML SOPN Inject 0.25 mg into the skin each morning on Sunday's once per week  for 4 weeks. Then, increase to 0.5 mg if no nausea or hypoglycemia 9 mL 3   tamsulosin (FLOMAX) 0.4 MG CAPS capsule TAKE 1 CAPSULE BY MOUTH AT BEDTIME (STOP IF DIZZINESS OCCURS) 90 capsule 3   Vitamin D, Ergocalciferol, (DRISDOL) 1.25 MG (50000 UNIT) CAPS capsule Take 1 capsule by mouth weekly. 4 capsule 1   No current facility-administered medications for this visit.    Allergies:   Patient has no known allergies.   Social History: Social History   Socioeconomic History   Marital status: Significant Other    Spouse name: Significant other [name]   Number of children: 1   Years of education: Not on file   Highest education level: Not on file  Occupational History   Occupation: Disabled  Tobacco Use   Smoking status: Never   Smokeless tobacco: Never  Vaping Use   Vaping Use: Never used  Substance and Sexual Activity   Alcohol use: Never   Drug use: Never   Sexual activity: Not Currently  Other Topics Concern   Not on file  Social History Narrative   ** Merged History Encounter **       Social Determinants of Health   Financial Resource Strain: Not on file  Food Insecurity: Not on file  Transportation Needs: Not on file  Physical Activity: Not on file  Stress: Not on file  Social Connections: Not on file  Intimate Partner Violence: Not on file    Family History: Family History  Problem Relation Age of Onset   Diabetes Mother    Hypertension Mother    Diabetes Father    Heart attack Father    Diabetes Sister    Heart attack Maternal Grandmother     Review of Systems: All other systems reviewed and are otherwise negative except as noted above.   Physical Exam: There were no vitals filed for this visit.   GEN- The patient is well appearing, alert and  oriented x 3 today.   HEENT: normocephalic, atraumatic; sclera  clear, conjunctiva pink; hearing intact; oropharynx clear; neck supple, no JVP Lymph- no cervical lymphadenopathy Lungs- Clear to ausculation bilaterally, normal work of breathing.  No wheezes, rales, rhonchi Heart- Regular rate and rhythm, no murmurs, rubs or gallops, PMI not laterally displaced GI- soft, non-tender, non-distended, bowel sounds present, no hepatosplenomegaly Extremities- no clubbing or cyanosis. No edema; DP/PT/radial pulses 2+ bilaterally MS- no significant deformity or atrophy Skin- warm and dry, no rash or lesion; ICD pocket well healed Psych- euthymic mood, full affect Neuro- strength and sensation are intact  ICD interrogation- reviewed in detail today,  See PACEART report  EKG:  EKG is ordered today. The ekg ordered today shows NSR at 78 bpm, QRS 88 ms.  Recent Labs: 03/11/2022: TSH 0.831 03/17/2022: Magnesium 1.9 03/31/2022: ALT 18; B Natriuretic Peptide 16.3 04/01/2022: BUN 42; Creatinine, Ser 3.29; Hemoglobin 10.5; Platelets 251; Potassium 4.3; Sodium 139   Wt Readings from Last 3 Encounters:  04/18/22 222 lb 12.8 oz (101.1 kg)  04/12/22 220 lb 6.4 oz (100 kg)  03/31/22 215 lb (97.5 kg)     Other studies Reviewed: Additional studies/ records that were reviewed today include: Previous EP office notes. Most recent labs and echo.    Assessment and Plan:  1.  Chronic systolic dysfunction s/p Medtronic single chamber ICD  euvolemic today Stable on an appropriate medical regimen Normal ICD function See most recent Pace Art  Barostim device titrated from *** to *** without stim.   2. HTN Stable on current regimen   3. Uncontrolled Diabetes Most recent Hgb A1c was 10.7.  This may affect his candidacy for procedures. Will follow closely.  Labs/ tests ordered today include:  No orders of the defined types were placed in this encounter.  Disposition:   Follow up with EP APP 3 months, sooner if proceeding with barostim.   Jacalyn Lefevre,  PA-C  05/17/2022 3:08 PM  [place] [address] [phone] (office) [phone] (fax)

## 2022-05-18 ENCOUNTER — Other Ambulatory Visit: Payer: Self-pay

## 2022-05-19 ENCOUNTER — Ambulatory Visit: Payer: Medicare Other | Admitting: Student

## 2022-05-19 DIAGNOSIS — I428 Other cardiomyopathies: Secondary | ICD-10-CM

## 2022-05-19 DIAGNOSIS — I2510 Atherosclerotic heart disease of native coronary artery without angina pectoris: Secondary | ICD-10-CM

## 2022-05-19 DIAGNOSIS — I10 Essential (primary) hypertension: Secondary | ICD-10-CM

## 2022-05-20 ENCOUNTER — Other Ambulatory Visit: Payer: Self-pay

## 2022-05-23 NOTE — Telephone Encounter (Signed)
I spoke to Jeffrey [name]/[name] and she said she obtained the order she needed to move the home visit date.

## 2022-05-23 NOTE — Progress Notes (Deleted)
Electrophysiology Office Note Date: 05/23/2022  ID:  Jeffrey [name], DOB 1966-04-02, MRN [id number]  PCP: Willene Hatchet, NP Primary Cardiologist: Mertie Moores, MD Electrophysiologist: Virl Axe, MD   CC: Routine ICD follow-up  Jeffrey [name] is a 56 y.o. male seen today for Virl Axe, MD for routine electrophysiology followup.    Saw Dr. Caryl Comes 03/09/2022 and device titrated.   Did have admission for ARF 03/10/2022 and Hypoglycemia 03/31/2022.  Since last being seen in our clinic the patient reports doing well. BP remains elevated ~ 086 systolic. BPs runs 120-130s at home. Girlfriend reports she is strict about making sure he gets his medication. He has SOB with mild exertion and is being seen by research today for further work up for [employer]. He has not had ICD shocks.   Device History: Medtronic Single Chamber ICD implanted 01/2018 for NICM and syncope History of appropriate therapy: No History of AAD therapy: No   Past Medical History:  Diagnosis Date   AICD (automatic cardioverter/defibrillator) present    Medtronic   AICD (automatic cardioverter/defibrillator) present    MDT Visia AF MRI   Anemia    CAD (coronary artery disease)    a. cath 01/31/17: 60% 1st RPLB, 60% dist RCA, 55% prox RCA, 10% pro LAD --> Rx TX.    CHF (congestive heart failure) (HCC)    Chronic systolic CHF (congestive heart failure) ([place]) 01/28/2017   1. Echo 01/29/17:  EF 20-25, normal wall motion, mild LAE // 2. EF 10-15 by [facility] 01/2017    Coronary artery disease    Diabetes mellitus    type II   Diabetes mellitus without complication ([place])    Diabetic foot infection ([place]) 03/2016   RT FOOT   Dyspnea    History of kidney stones    passed   History of kidney stones    HTN (hypertension)    Hyperlipidemia    Hypertension    Myocardial infarction [facility]), although reported, NICM at cath    NICM (nonischemic cardiomyopathy) ([place]) 02/15/2017   1. Mod non-obs CAD on LHC in 01/2017 - CAD  does not explain cardiomyopathy   Renal disorder    Past Surgical History:  Procedure Laterality Date   AIR/FLUID EXCHANGE Right 07/14/2020   Procedure: AIR/FLUID EXCHANGE;  Surgeon: Jalene Mullet, MD;  Location: [place];  Service: Ophthalmology;  Laterality: Right;   AMPUTATION Right 04/01/2016   Procedure: Right Great Toe Amputation;  Surgeon: Newt Minion, MD;  Location: [place];  Service: Orthopedics;  Laterality: Right;   AMPUTATION Right 06/19/2016   Procedure: AMPUTATION SECOND TOE;  Surgeon: Marybelle Killings, MD;  [place]  Service: Orthopedics;  Laterality: Right;   BACK SURGERY     for abscess   CARDIAC CATHETERIZATION     ICD IMPLANT N/A 01/15/2018   Procedure: ICD IMPLANT;  Surgeon: Deboraha Sprang, MD;  Location: [place] CV LAB;  Service: Cardiovascular;  Laterality: N/A;   INJECTION OF SILICONE OIL Right 04/17/8468   Procedure: INJECTION OF SILICONE OIL;  Surgeon: Jalene Mullet, MD;  Location: [place];  Service: Ophthalmology;  Laterality: Right;   INJECTION OF SILICONE OIL Right 06/10/5283   Procedure: INJECTION OF SILICONE OIL;  Surgeon: Jalene Mullet, MD;  Location: [place];  Service: Ophthalmology;  Laterality: Right;   INSERT / REPLACE / REMOVE PACEMAKER     MEMBRANE PEEL Right 08/25/2020   Procedure: MEMBRANE PEEL;  Surgeon: Jalene Mullet, MD;  Location: [place];  Service:  Ophthalmology;  Laterality: Right;   PARS PLANA VITRECTOMY Right 07/14/2020   Procedure: PARS PLANA VITRECTOMY WITH 25 GAUGE, Membranetomy, drainage of subretinal fluid;  Surgeon: Jalene Mullet, MD;  Location: [place];  Service: Ophthalmology;  Laterality: Right;   PARS PLANA VITRECTOMY Right 08/25/2020   Procedure: PARS PLANA VITRECTOMY WITH 25 GAUGE;  Surgeon: Jalene Mullet, MD;  Location: [place];  Service: Ophthalmology;  Laterality: Right;   PHOTOCOAGULATION WITH LASER Right 07/14/2020   Procedure: PHOTOCOAGULATION WITH LASER;  Surgeon: Jalene Mullet, MD;  Location: [place];  Service: Ophthalmology;   Laterality: Right;   PHOTOCOAGULATION WITH LASER Right 08/25/2020   Procedure: PHOTOCOAGULATION WITH LASER;  Surgeon: Jalene Mullet, MD;  Location: [place];  Service: Ophthalmology;  Laterality: Right;   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   Procedure: REPAIR OF HEMORRHAGIC DETACHMENT;  Surgeon: Jalene Mullet, MD;  Location: [place];  Service: Ophthalmology;  Laterality: Right;   RIGHT/LEFT HEART CATH AND CORONARY ANGIOGRAPHY N/A 01/31/2017   Procedure: Right/Left Heart Cath and Coronary Angiography;  Surgeon: Troy Sine, MD;  Location: [place] CV LAB;  Service: Cardiovascular;  Laterality: N/A;   SILICON OIL REMOVAL Right 1/74/9449   Procedure: SILICON OIL REMOVAL;  Surgeon: Jalene Mullet, MD;  Location: [place];  Service: Ophthalmology;  Laterality: Right;    Current Outpatient Medications  Medication Sig Dispense Refill   Accu-Chek Softclix Lancets lancets Use to check blood sugar three times daily E11.65 100 each 5   albuterol (VENTOLIN HFA) 108 (90 Base) MCG/ACT inhaler Inhale 1 to 2 puffs by mouth into the lungs every 6 hours as needed for wheezing or shortness of breath. 18 g 0   aspirin 81 MG EC tablet Take 1 tablet (81 mg total) by mouth daily. Swallow whole. 30 tablet 2   Blood Glucose Monitoring Suppl (ACCU-CHEK GUIDE) w/Device KIT Use to check blood sugar three times daily E11.65 1 kit 0   carvedilol (COREG) 12.5 MG tablet TAKE 1 TABLET (12.5 MG TOTAL) BY MOUTH 2 (TWO) TIMES DAILY WITH A MEAL. 60 tablet 6   cetirizine (ZYRTEC) 10 MG tablet TAKE 1 TABLET (10 MG TOTAL) BY MOUTH DAILY. 30 tablet 0   Continuous Blood Gluc Receiver (FREESTYLE LIBRE 2 READER) DEVI Use as directed four to five times daily. 1 each 0   Continuous Blood Gluc Sensor (FREESTYLE LIBRE 2 SENSOR) MISC Use as directed, to montior blood sugar 4 to 5 times a day daily. 2 each 11   Elastic Bandages & Supports (T.E.D. KNEE LENGTH/S-LONG) MISC 1 (one) each daily, apply compression  stockings daily to help decrease leg swelling 1 each 0   ferrous sulfate 325 (65 FE) MG tablet Take 1 tablet (325 mg total) by mouth 2 (two) times daily with a meal. 60 tablet 3   furosemide (LASIX) 40 MG tablet Take 1 tablet (40 mg total) by mouth 2 (two) times daily. 60 tablet 6   furosemide (LASIX) 40 MG tablet TAKE 1 TABLET BY MOUTH TWICE A DAY 180 tablet 3   gabapentin (NEURONTIN) 300 MG capsule Take 1 capsule (300 mg total) by mouth at bedtime. 60 capsule 6   gabapentin (NEURONTIN) 300 MG capsule TAKE 2 CAPSULES BY MOUTH AT BEDTIME 180 capsule 3   Glucagon 3 MG/DOSE POWD Place 3 mg into the nose once as needed for up to 1 dose. 1 each 11   glucose blood (ACCU-CHEK GUIDE) test strip Use to check blood sugar three times daily E11.65 100 each 12  hydrALAZINE (APRESOLINE) 100 MG tablet Take 1 tablet (100 mg total) by mouth 3 (three) times daily. 90 tablet 0   hydrALAZINE (APRESOLINE) 25 MG tablet TAKE 1 TABLET  BY MOUTH THREE TIMES A DAY 270 tablet 3   Insulin Glargine Solostar (LANTUS) 100 UNIT/ML Solostar Pen Inject 12-14 Units into the skin daily. 15 mL 3   Insulin Pen Needle (BD PEN NEEDLE NANO U/F) 32G X 4 MM MISC USE TO INJECT LANTUS DAILY. MUST USE NEW PEN NEEDLE WITH EACH INJECTION. 100 each 3   isosorbide mononitrate (IMDUR) 30 MG 24 hr tablet Take 1 tablet (30 mg total) by mouth daily. 30 tablet 6   lisinopril (ZESTRIL) 10 MG tablet Take 10 mg by mouth daily.     lisinopril (ZESTRIL) 10 MG tablet TAKE 1 TABLET BY MOUTH ONCE A DAY 90 tablet 3   ondansetron (ZOFRAN) 4 MG tablet Take 1 tablet (4 mg total) by mouth every 6 (six) hours as needed for nausea or vomiting. 12 tablet 0   potassium chloride SA (KLOR-CON M) 20 MEQ tablet Take 1 tablet (20 mEq total) by mouth 2 (two) times daily. 180 tablet 3   potassium chloride SA (KLOR-CON M) 20 MEQ tablet TAKE 2 TABLETS BY MOUTH DAILY 180 tablet 3   rosuvastatin (CRESTOR) 40 MG tablet TAKE 1 TABLET (40 MG TOTAL) BY MOUTH DAILY. 90 tablet 1    Semaglutide,0.25 or 0.5MG/DOS, (OZEMPIC, 0.25 OR 0.5 MG/DOSE,) 2 MG/3ML SOPN Inject 0.25 mg into the skin each morning on Sunday's once per week  for 4 weeks. Then, increase to 0.5 mg if no nausea or hypoglycemia 9 mL 3   tamsulosin (FLOMAX) 0.4 MG CAPS capsule TAKE 1 CAPSULE BY MOUTH AT BEDTIME (STOP IF DIZZINESS OCCURS) 90 capsule 3   Vitamin D, Ergocalciferol, (DRISDOL) 1.25 MG (50000 UNIT) CAPS capsule Take 1 capsule by mouth weekly. 4 capsule 1   No current facility-administered medications for this visit.    Allergies:   Patient has no known allergies.   Social History: Social History   Socioeconomic History   Marital status: Significant Other    Spouse name: Significant other [name]   Number of children: 1   Years of education: Not on file   Highest education level: Not on file  Occupational History   Occupation: Disabled  Tobacco Use   Smoking status: Never   Smokeless tobacco: Never  Vaping Use   Vaping Use: Never used  Substance and Sexual Activity   Alcohol use: Never   Drug use: Never   Sexual activity: Not Currently  Other Topics Concern   Not on file  Social History Narrative   ** Merged History Encounter **       Social Determinants of Health   Financial Resource Strain: Not on file  Food Insecurity: Not on file  Transportation Needs: Not on file  Physical Activity: Not on file  Stress: Not on file  Social Connections: Not on file  Intimate Partner Violence: Not on file    Family History: Family History  Problem Relation Age of Onset   Diabetes Mother    Hypertension Mother    Diabetes Father    Heart attack Father    Diabetes Sister    Heart attack Maternal Grandmother     Review of Systems: All other systems reviewed and are otherwise negative except as noted above.   Physical Exam: There were no vitals filed for this visit.   GEN- The patient is well appearing, alert and  oriented x 3 today.   HEENT: normocephalic, atraumatic; sclera  clear, conjunctiva pink; hearing intact; oropharynx clear; neck supple, no JVP Lymph- no cervical lymphadenopathy Lungs- Clear to ausculation bilaterally, normal work of breathing.  No wheezes, rales, rhonchi Heart- Regular rate and rhythm, no murmurs, rubs or gallops, PMI not laterally displaced GI- soft, non-tender, non-distended, bowel sounds present, no hepatosplenomegaly Extremities- no clubbing or cyanosis. No edema; DP/PT/radial pulses 2+ bilaterally MS- no significant deformity or atrophy Skin- warm and dry, no rash or lesion; ICD pocket well healed Psych- euthymic mood, full affect Neuro- strength and sensation are intact  ICD interrogation- reviewed in detail today,  See PACEART report  EKG:  EKG is ordered today. The ekg ordered today shows NSR at 78 bpm, QRS 88 ms.  Recent Labs: 03/11/2022: TSH 0.831 03/17/2022: Magnesium 1.9 03/31/2022: ALT 18; B Natriuretic Peptide 16.3 04/01/2022: BUN 42; Creatinine, Ser 3.29; Hemoglobin 10.5; Platelets 251; Potassium 4.3; Sodium 139   Wt Readings from Last 3 Encounters:  04/18/22 222 lb 12.8 oz (101.1 kg)  04/12/22 220 lb 6.4 oz (100 kg)  03/31/22 215 lb (97.5 kg)     Other studies Reviewed: Additional studies/ records that were reviewed today include: Previous EP office notes. Most recent labs and echo.    Assessment and Plan:  1.  Chronic systolic dysfunction s/p Medtronic single chamber ICD  euvolemic today Stable on an appropriate medical regimen Normal ICD function See most recent Pace Art  Barostim device titrated from *** to *** without stim.   2. HTN Stable on current regimen   3. Uncontrolled Diabetes Most recent Hgb A1c was 10.7.  This may affect his candidacy for procedures. Will follow closely.  Labs/ tests ordered today include:  No orders of the defined types were placed in this encounter.  Disposition:   Follow up with EP APP 3 months, sooner if proceeding with barostim.   Jacalyn Lefevre,  PA-C  05/23/2022 8:20 AM  [facility] [address] [place]  [place] [address] [phone] (office) [phone] (fax)

## 2022-05-30 ENCOUNTER — Encounter: Payer: Medicare Other | Admitting: Student

## 2022-05-30 ENCOUNTER — Other Ambulatory Visit: Payer: Self-pay

## 2022-05-30 DIAGNOSIS — I2510 Atherosclerotic heart disease of native coronary artery without angina pectoris: Secondary | ICD-10-CM

## 2022-05-30 DIAGNOSIS — I5022 Chronic systolic (congestive) heart failure: Secondary | ICD-10-CM

## 2022-05-30 DIAGNOSIS — I10 Essential (primary) hypertension: Secondary | ICD-10-CM

## 2022-05-30 DIAGNOSIS — I428 Other cardiomyopathies: Secondary | ICD-10-CM

## 2022-05-30 NOTE — Progress Notes (Deleted)
Electrophysiology Office Note Date: 05/30/2022  ID:  Jeffrey [name], DOB [birth year], MRN [id number]  PCP: Willene Hatchet, NP Primary Cardiologist: Mertie Moores, MD Electrophysiologist: Virl Axe, MD   CC: Routine ICD follow-up  Jeffrey [name] is a 56 y.o. male seen today for Virl Axe, MD for routine electrophysiology followup.    Saw Dr. Caryl Comes 03/09/2022 and device titrated.   Did have admission for ARF 03/10/2022 and Hypoglycemia 03/31/2022.  Since last being seen in our clinic the patient reports doing well. BP remains elevated ~ 383 systolic. BPs runs 120-130s at home. Girlfriend reports she is strict about making sure he gets his medication. He has SOB with mild exertion and is being seen by research today for further work up for [employer]. He has not had ICD shocks.   Device History: Medtronic Single Chamber ICD implanted 01/2018 for NICM and syncope History of appropriate therapy: No History of AAD therapy: No   Past Medical History:  Diagnosis Date   AICD (automatic cardioverter/defibrillator) present    Medtronic   AICD (automatic cardioverter/defibrillator) present    MDT Visia AF MRI   Anemia    CAD (coronary artery disease)    a. cath 01/31/17: 60% 1st RPLB, 60% dist RCA, 55% prox RCA, 10% pro LAD --> Rx TX.    CHF (congestive heart failure) (HCC)    Chronic systolic CHF (congestive heart failure) ([place]) 01/28/2017   1. Echo 01/29/17:  EF 20-25, normal wall motion, mild LAE // 2. EF 10-15 by [facility] 01/2017    Coronary artery disease    Diabetes mellitus    type II   Diabetes mellitus without complication ([place])    Diabetic foot infection ([place]) 03/2016   RT FOOT   Dyspnea    History of kidney stones    passed   History of kidney stones    HTN (hypertension)    Hyperlipidemia    Hypertension    Myocardial infarction [facility]), although reported, NICM at cath    NICM (nonischemic cardiomyopathy) ([place]) 02/15/2017   1. Mod non-obs CAD on LHC in 01/2017 - CAD  does not explain cardiomyopathy   Renal disorder    Past Surgical History:  Procedure Laterality Date   AIR/FLUID EXCHANGE Right 07/14/2020   Procedure: AIR/FLUID EXCHANGE;  Surgeon: Jalene Mullet, MD;  Location: [place];  Service: Ophthalmology;  Laterality: Right;   AMPUTATION Right 04/01/2016   Procedure: Right Great Toe Amputation;  Surgeon: Newt Minion, MD;  Location: [place];  Service: Orthopedics;  Laterality: Right;   AMPUTATION Right 06/19/2016   Procedure: AMPUTATION SECOND TOE;  Surgeon: Marybelle Killings, MD;  Location: [place];  Service: Orthopedics;  Laterality: Right;   BACK SURGERY     for abscess   CARDIAC CATHETERIZATION     ICD IMPLANT N/A 01/15/2018   Procedure: ICD IMPLANT;  Surgeon: Deboraha Sprang, MD;  Location: [place] [facility];  Service: Cardiovascular;  Laterality: N/A;   INJECTION OF SILICONE OIL Right 02/12/8328   Procedure: INJECTION OF SILICONE OIL;  Surgeon: Jalene Mullet, MD;  Location: [place];  Service: Ophthalmology;  Laterality: Right;   INJECTION OF SILICONE OIL Right 1/91/6606   Procedure: INJECTION OF SILICONE OIL;  Surgeon: Jalene Mullet, MD;  Location: [place];  Service: Ophthalmology;  Laterality: Right;   INSERT / REPLACE / REMOVE PACEMAKER     MEMBRANE PEEL Right 08/25/2020   Procedure: MEMBRANE PEEL;  Surgeon: Jalene Mullet, MD;  Location: [place];  Service:  Ophthalmology;  Laterality: Right;   PARS PLANA VITRECTOMY Right 07/14/2020   Procedure: PARS PLANA VITRECTOMY WITH 25 GAUGE, Membranetomy, drainage of subretinal fluid;  Surgeon: Jalene Mullet, MD;  Location: [place];  Service: Ophthalmology;  Laterality: Right;   PARS PLANA VITRECTOMY Right 08/25/2020   Procedure: PARS PLANA VITRECTOMY WITH 25 GAUGE;  Surgeon: Jalene Mullet, MD;  Location: [place];  Service: Ophthalmology;  Laterality: Right;   PHOTOCOAGULATION WITH LASER Right 07/14/2020   Procedure: PHOTOCOAGULATION WITH LASER;  Surgeon: Jalene Mullet, MD;  Location: [place];  Service: Ophthalmology;   Laterality: Right;   PHOTOCOAGULATION WITH LASER Right 08/25/2020   Procedure: PHOTOCOAGULATION WITH LASER;  Surgeon: Jalene Mullet, MD;  Location: [place];  Service: Ophthalmology;  Laterality: Right;   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   Procedure: REPAIR OF HEMORRHAGIC DETACHMENT;  Surgeon: Jalene Mullet, MD;  Location: [place];  Service: Ophthalmology;  Laterality: Right;   RIGHT/LEFT HEART CATH AND CORONARY ANGIOGRAPHY N/A 01/31/2017   Procedure: Right/Left Heart Cath and Coronary Angiography;  Surgeon: Troy Sine, MD;  Location: [place] [facility];  Service: Cardiovascular;  Laterality: N/A;   SILICON OIL REMOVAL Right 1/74/9449   Procedure: SILICON OIL REMOVAL;  Surgeon: Jalene Mullet, MD;  Location: [place];  Service: Ophthalmology;  Laterality: Right;    Current Outpatient Medications  Medication Sig Dispense Refill   Accu-Chek Softclix Lancets lancets Use to check blood sugar three times daily E11.65 100 each 5   albuterol (VENTOLIN HFA) 108 (90 Base) MCG/ACT inhaler Inhale 1 to 2 puffs by mouth into the lungs every 6 hours as needed for wheezing or shortness of breath. 18 g 0   aspirin 81 MG EC tablet Take 1 tablet (81 mg total) by mouth daily. Swallow whole. 30 tablet 2   Blood Glucose Monitoring Suppl (ACCU-CHEK GUIDE) w/Device KIT Use to check blood sugar three times daily E11.65 1 kit 0   carvedilol (COREG) 12.5 MG tablet TAKE 1 TABLET (12.5 MG TOTAL) BY MOUTH 2 (TWO) TIMES DAILY WITH A MEAL. 60 tablet 6   cetirizine (ZYRTEC) 10 MG tablet TAKE 1 TABLET (10 MG TOTAL) BY MOUTH DAILY. 30 tablet 0   Continuous Blood Gluc Receiver (FREESTYLE LIBRE 2 READER) DEVI Use as directed four to five times daily. 1 each 0   Continuous Blood Gluc Sensor (FREESTYLE LIBRE 2 SENSOR) MISC Use as directed, to montior blood sugar 4 to 5 times a day daily. 2 each 11   Elastic Bandages & Supports (T.E.D. KNEE LENGTH/S-LONG) MISC 1 (one) each daily, apply compression  stockings daily to help decrease leg swelling 1 each 0   ferrous sulfate 325 (65 FE) MG tablet Take 1 tablet (325 mg total) by mouth 2 (two) times daily with a meal. 60 tablet 3   furosemide (LASIX) 40 MG tablet Take 1 tablet (40 mg total) by mouth 2 (two) times daily. 60 tablet 6   furosemide (LASIX) 40 MG tablet TAKE 1 TABLET BY MOUTH TWICE A DAY 180 tablet 3   gabapentin (NEURONTIN) 300 MG capsule Take 1 capsule (300 mg total) by mouth at bedtime. 60 capsule 6   gabapentin (NEURONTIN) 300 MG capsule TAKE 2 CAPSULES BY MOUTH AT BEDTIME 180 capsule 3   Glucagon 3 MG/DOSE POWD Place 3 mg into the nose once as needed for up to 1 dose. 1 each 11   glucose blood (ACCU-CHEK GUIDE) test strip Use to check blood sugar three times daily E11.65 100 each 12  hydrALAZINE (APRESOLINE) 100 MG tablet Take 1 tablet (100 mg total) by mouth 3 (three) times daily. 90 tablet 0   hydrALAZINE (APRESOLINE) 25 MG tablet TAKE 1 TABLET  BY MOUTH THREE TIMES A DAY 270 tablet 3   Insulin Glargine Solostar (LANTUS) 100 UNIT/ML Solostar Pen Inject 12-14 Units into the skin daily. 15 mL 3   Insulin Pen Needle (BD PEN NEEDLE NANO U/F) 32G X 4 MM MISC USE TO INJECT LANTUS DAILY. MUST USE NEW PEN NEEDLE WITH EACH INJECTION. 100 each 3   isosorbide mononitrate (IMDUR) 30 MG 24 hr tablet Take 1 tablet (30 mg total) by mouth daily. 30 tablet 6   lisinopril (ZESTRIL) 10 MG tablet Take 10 mg by mouth daily.     lisinopril (ZESTRIL) 10 MG tablet TAKE 1 TABLET BY MOUTH ONCE A DAY 90 tablet 3   ondansetron (ZOFRAN) 4 MG tablet Take 1 tablet (4 mg total) by mouth every 6 (six) hours as needed for nausea or vomiting. 12 tablet 0   potassium chloride SA (KLOR-CON M) 20 MEQ tablet Take 1 tablet (20 mEq total) by mouth 2 (two) times daily. 180 tablet 3   potassium chloride SA (KLOR-CON M) 20 MEQ tablet TAKE 2 TABLETS BY MOUTH DAILY 180 tablet 3   rosuvastatin (CRESTOR) 40 MG tablet TAKE 1 TABLET (40 MG TOTAL) BY MOUTH DAILY. 90 tablet 1    Semaglutide,0.25 or 0.5MG/DOS, (OZEMPIC, 0.25 OR 0.5 MG/DOSE,) 2 MG/3ML SOPN Inject 0.25 mg into the skin each morning on Sunday's once per week  for 4 weeks. Then, increase to 0.5 mg if no nausea or hypoglycemia 9 mL 3   tamsulosin (FLOMAX) 0.4 MG CAPS capsule TAKE 1 CAPSULE BY MOUTH AT BEDTIME (STOP IF DIZZINESS OCCURS) 90 capsule 3   Vitamin D, Ergocalciferol, (DRISDOL) 1.25 MG (50000 UNIT) CAPS capsule Take 1 capsule by mouth weekly. 4 capsule 1   No current facility-administered medications for this visit.    Allergies:   Patient has no known allergies.   Social History: Social History   Socioeconomic History   Marital status: Significant Other    Spouse name: Significant other Jeffrey [name]   Number of children: 1   Years of education: Not on file   Highest education level: Not on file  Occupational History   Occupation: Disabled  Tobacco Use   Smoking status: Never   Smokeless tobacco: Never  Vaping Use   Vaping Use: Never used  Substance and Sexual Activity   Alcohol use: Never   Drug use: Never   Sexual activity: Not Currently  Other Topics Concern   Not on file  Social History Narrative   ** Merged History Encounter **       Social Determinants of Health   Financial Resource Strain: Not on file  Food Insecurity: Not on file  Transportation Needs: Not on file  Physical Activity: Not on file  Stress: Not on file  Social Connections: Not on file  Intimate Partner Violence: Not on file    Family History: Family History  Problem Relation Age of Onset   Diabetes Mother    Hypertension Mother    Diabetes Father    Heart attack Father    Diabetes Sister    Heart attack Maternal Grandmother     Review of Systems: All other systems reviewed and are otherwise negative except as noted above.   Physical Exam: There were no vitals filed for this visit.   GEN- The patient is well appearing, alert and  oriented x 3 today.   HEENT: normocephalic, atraumatic; sclera  clear, conjunctiva pink; hearing intact; oropharynx clear; neck supple, no JVP Lymph- no cervical lymphadenopathy Lungs- Clear to ausculation bilaterally, normal work of breathing.  No wheezes, rales, rhonchi Heart- Regular rate and rhythm, no murmurs, rubs or gallops, PMI not laterally displaced GI- soft, non-tender, non-distended, bowel sounds present, no hepatosplenomegaly Extremities- no clubbing or cyanosis. No edema; DP/PT/radial pulses 2+ bilaterally MS- no significant deformity or atrophy Skin- warm and dry, no rash or lesion; ICD pocket well healed Psych- euthymic mood, full affect Neuro- strength and sensation are intact  ICD interrogation- reviewed in detail today,  See PACEART report  EKG:  EKG is ordered today. The ekg ordered today shows NSR at 78 bpm, QRS 88 ms.  Recent Labs: 03/11/2022: TSH 0.831 03/17/2022: Magnesium 1.9 03/31/2022: ALT 18; B Natriuretic Peptide 16.3 04/01/2022: BUN 42; Creatinine, Ser 3.29; Hemoglobin 10.5; Platelets 251; Potassium 4.3; Sodium 139   Wt Readings from Last 3 Encounters:  04/18/22 222 lb 12.8 oz (101.1 kg)  04/12/22 220 lb 6.4 oz (100 kg)  03/31/22 215 lb (97.5 kg)     Other studies Reviewed: Additional studies/ records that were reviewed today include: Previous EP office notes. Most recent labs and echo.    Assessment and Plan:  1.  Chronic systolic dysfunction s/p Medtronic single chamber ICD  euvolemic today Stable on an appropriate medical regimen Normal ICD function See most recent Pace Art  Barostim device titrated from *** to *** without stim.   2. HTN Stable on current regimen   3. Uncontrolled Diabetes Most recent Hgb A1c was 10.7.  This may affect his candidacy for procedures. Will follow closely.  Labs/ tests ordered today include:  No orders of the defined types were placed in this encounter.  Disposition:   Follow up with EP APP 3 months, sooner if proceeding with barostim.   Jacalyn Lefevre,  PA-C  05/30/2022 11:44 AM  [facility] [address] [place] [address] [phone] (office) [phone] (fax)

## 2022-06-01 ENCOUNTER — Encounter: Payer: Medicare Other | Admitting: Student

## 2022-06-01 DIAGNOSIS — I5042 Chronic combined systolic (congestive) and diastolic (congestive) heart failure: Secondary | ICD-10-CM

## 2022-06-01 DIAGNOSIS — I10 Essential (primary) hypertension: Secondary | ICD-10-CM

## 2022-06-03 ENCOUNTER — Other Ambulatory Visit: Payer: Self-pay

## 2022-06-06 ENCOUNTER — Other Ambulatory Visit: Payer: Self-pay

## 2022-06-20 ENCOUNTER — Other Ambulatory Visit: Payer: Self-pay

## 2022-06-28 ENCOUNTER — Ambulatory Visit (INDEPENDENT_AMBULATORY_CARE_PROVIDER_SITE_OTHER): Payer: Medicare Other | Admitting: Internal Medicine

## 2022-06-28 ENCOUNTER — Encounter: Payer: Self-pay | Admitting: Internal Medicine

## 2022-06-28 VITALS — BP 110/80 | HR 96 | Ht 67.0 in | Wt 204.2 lb

## 2022-06-28 DIAGNOSIS — E1165 Type 2 diabetes mellitus with hyperglycemia: Secondary | ICD-10-CM | POA: Diagnosis not present

## 2022-06-28 DIAGNOSIS — E782 Mixed hyperlipidemia: Secondary | ICD-10-CM

## 2022-06-28 DIAGNOSIS — E1159 Type 2 diabetes mellitus with other circulatory complications: Secondary | ICD-10-CM | POA: Diagnosis not present

## 2022-06-28 LAB — POCT GLYCOSYLATED HEMOGLOBIN (HGB A1C): Hemoglobin A1C: 6.8 % — AB (ref 4.0–5.6)

## 2022-06-28 NOTE — Progress Notes (Signed)
Patient ID: Jeffrey [name], male   DOB: 05-18-1966, 56 y.o.   MRN: [id number]  HPI: Jeffrey [name] is a 56 y.o.-year-old male, referred by his PCP, Dr. Evie Lacks, for management of DM2, dx in ~2013, insulin-dependent  since dx., uncontrolled, with long-term complications (CAD, diastolic and systolic CHF, CVAs, CKD stage 4, peripheral neuropathy - s/p 2 R toe amputations, DR, recurring hypoglycemia).  He is here with his wife who offers most of the information about his diabetes history, other medical conditions, diet, and activity, along with blood sugars and insulin doses.  Patient is somnolent and dozes off during the appointment.  Interim history: No increased urination, blurry vision, nausea, chest pain. No more hypoglycemic episodes since last visit. He is in a wheelchair today, per his wife this is mostly because of his poor vision.  Reviewed HbA1c: Lab Results  Component Value Date   HGBA1C 9.7 (H) 03/11/2022   HGBA1C 7.6 (A) 10/20/2021   HGBA1C 14.2 (H) 06/30/2021   HGBA1C 10.7 (A) 01/13/2021   HGBA1C 15.4 (H) 06/13/2020   HGBA1C 9.9 (H) 04/25/2019   HGBA1C 7.2 (A) 10/24/2018   HGBA1C >15.5 (H) 06/22/2018   HGBA1C 13.2 03/21/2018   HGBA1C 13.8 12/20/2017   HGBA1C 11.8 (H) 01/29/2017   HGBA1C 12.4 (H) 09/12/2016   HGBA1C 13.0 (H) 06/18/2016   HGBA1C 13.0 (H) 06/17/2016   HGBA1C 13.7 02/24/2016   HGBA1C 13.4 (H) 09/28/2015   HGBA1C 13.4 (H) 09/25/2015   HGBA1C 13.90 06/04/2015   HGBA1C 9.60 03/04/2015   HGBA1C 12.9 11/12/2014   HGBA1C 9.9 08/26/2014   HGBA1C >14% 04/21/2014   At last visit he was on: - Lantus 35 >> 25 >> 20 units at bedtime (decreased 03/31/2022) -  (stopped 03/31/2022)  We changed to: - Lantus 12-14 units in a.m. - Ozempic 0.25 >> 0.5 mg weekly in a.m.  Pt checks his sugars >4x a day with the Freestyle Libre 2: CGM - not recently - per wife's recall: - am:  96-120 - 2h after b'fast: 134-150 - lunch: 130-140 - 2h after lunch: n/c - dinner:  145-160s (sodas) - 2h after dinner: 170s-180 - bedtime: n/c  Previously:   Lowest sugar was 47 >> 70; he has hypoglycemia awareness at 60.  He was admitted with hypoglycemia-induced encephalopathy (blood sugar 40) 03/10/2022.  At that time he was unresponsive He was admitted with hypoglycemia (blood sugar 47) 03/31/2022. Highest sugar was 300 >> 200.  Glucometer: Accu-Chek guide  Pt's meals are: - Breakfast: grits, scrambled eggs and bacon or cereal or oatmeal >> fruit, croissant + cheese and egg - Lunch: PB and jelly; sandwich; soup - Dinner: same; rotisserie chicken - Snacks: seldom banana as a dessert  - + stage 4 CKD, last BUN/creatinine:  Lab Results  Component Value Date   BUN 42 (H) 04/01/2022   BUN 37 (H) 03/31/2022   CREATININE 3.29 (H) 04/01/2022   CREATININE 2.98 (H) 03/31/2022  He is on lisinopril 10 mg daily.  - + HL; last set of lipids: Lab Results  Component Value Date   CHOL 201 (H) 07/02/2021   HDL 36 (L) 07/02/2021   LDLCALC 120 (H) 07/02/2021   TRIG 225 (H) 07/02/2021   CHOLHDL 5.6 07/02/2021  On Crestor 40 mg daily.  - last eye exam was in 06/2022. + DR. Dr. Wyatt Portela. 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 Surgeon: Dr. Posey Pronto. Has cataract. Will have surgery OS as he has been given scar tissue buildup in this eye, also.  - no  numbness and tingling in his feet.  Last foot exam 10/20/2021.  He is on ASA 81.  Pt has FH of DM in M, F, 1 B, 3 S's.  He is on iron po.  No personal history of pancreatitis or family history of medullary thyroid cancer or multiple endocrine neoplasia syndromes.  ROS: + See HPI  Past Medical History:  Diagnosis Date   AICD (automatic cardioverter/defibrillator) present    Medtronic   AICD (automatic cardioverter/defibrillator) present    MDT Visia AF MRI   Anemia    CAD (coronary artery disease)    a. cath 01/31/17: 60% 1st RPLB, 60% dist RCA, 55% prox RCA, 10% pro LAD --> Rx TX.    CHF (congestive heart failure) (HCC)    Chronic  systolic CHF (congestive heart failure) ([place]) 01/28/2017   1. Echo 01/29/17:  EF 20-25, normal wall motion, mild LAE // 2. EF 10-15 by [facility] 01/2017    Coronary artery disease    Diabetes mellitus    type II   Diabetes mellitus without complication ([place])    Diabetic foot infection ([place]) 03/2016   RT FOOT   Dyspnea    History of kidney stones    passed   History of kidney stones    HTN (hypertension)    Hyperlipidemia    Hypertension    Myocardial infarction [facility]), although reported, NICM at cath    NICM (nonischemic cardiomyopathy) ([place]) 02/15/2017   1. Mod non-obs CAD on LHC in 01/2017 - CAD does not explain cardiomyopathy   Renal disorder    Past Surgical History:  Procedure Laterality Date   AIR/FLUID EXCHANGE Right 07/14/2020   Procedure: AIR/FLUID EXCHANGE;  Surgeon: Jalene Mullet, MD;  Location: [place];  Service: Ophthalmology;  Laterality: Right;   AMPUTATION Right 04/01/2016   Procedure: Right Great Toe Amputation;  Surgeon: Newt Minion, MD;  Location: [place];  Service: Orthopedics;  Laterality: Right;   AMPUTATION Right 06/19/2016   Procedure: AMPUTATION SECOND TOE;  Surgeon: Marybelle Killings, MD;  Location: [place];  Service: Orthopedics;  Laterality: Right;   BACK SURGERY     for abscess   CARDIAC CATHETERIZATION     ICD IMPLANT N/A 01/15/2018   Procedure: ICD IMPLANT;  Surgeon: Deboraha Sprang, MD;  Location: [place] [facility];  Service: Cardiovascular;  Laterality: N/A;   INJECTION OF SILICONE OIL Right 5/0/5397   Procedure: INJECTION OF SILICONE OIL;  Surgeon: Jalene Mullet, MD;  Location: [place];  Service: Ophthalmology;  Laterality: Right;   INJECTION OF SILICONE OIL Right 6/73/4193   Procedure: INJECTION OF SILICONE OIL;  Surgeon: Jalene Mullet, MD;  Location: [place];  Service: Ophthalmology;  Laterality: Right;   INSERT / REPLACE / REMOVE PACEMAKER     MEMBRANE PEEL Right 08/25/2020   Procedure: MEMBRANE PEEL;  Surgeon: Jalene Mullet, MD;  Location: [place];  Service:  Ophthalmology;  Laterality: Right;   PARS PLANA VITRECTOMY Right 07/14/2020   Procedure: PARS PLANA VITRECTOMY WITH 25 GAUGE, Membranetomy, drainage of subretinal fluid;  Surgeon: Jalene Mullet, MD;  Location: [place];  Service: Ophthalmology;  Laterality: Right;   PARS PLANA VITRECTOMY Right 08/25/2020   Procedure: PARS PLANA VITRECTOMY WITH 25 GAUGE;  Surgeon: Jalene Mullet, MD;  Location: [place];  Service: Ophthalmology;  Laterality: Right;   PHOTOCOAGULATION WITH LASER Right 07/14/2020   Procedure: PHOTOCOAGULATION WITH LASER;  Surgeon: Jalene Mullet, MD;  Location: [place];  Service: Ophthalmology;  Laterality: Right;   PHOTOCOAGULATION WITH LASER  Right 08/25/2020   Procedure: PHOTOCOAGULATION WITH LASER;  Surgeon: Jalene Mullet, MD;  Location: [place];  Service: Ophthalmology;  Laterality: Right;   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   Procedure: REPAIR OF HEMORRHAGIC DETACHMENT;  Surgeon: Jalene Mullet, MD;  Location: [place];  Service: Ophthalmology;  Laterality: Right;   RIGHT/LEFT HEART CATH AND CORONARY ANGIOGRAPHY N/A 01/31/2017   Procedure: Right/Left Heart Cath and Coronary Angiography;  Surgeon: Troy Sine, MD;  Location: [place] [facility];  Service: Cardiovascular;  Laterality: N/A;   SILICON OIL REMOVAL Right 8/36/6294   Procedure: SILICON OIL REMOVAL;  Surgeon: Jalene Mullet, MD;  Location: [place];  Service: Ophthalmology;  Laterality: Right;   Social History   Socioeconomic History   Marital status: Significant Other    Spouse name: Significant other [name]   Number of children: 1   Years of education: Not on file   Highest education level: Not on file  Occupational History   Occupation: Disabled  Tobacco Use   Smoking status: Never   Smokeless tobacco: Never  Vaping Use   Vaping Use: Never used  Substance and Sexual Activity   Alcohol use: Never   Drug use: Never   Sexual activity: Not Currently  Other Topics Concern   Not on file   Social History Narrative   ** Merged History Encounter **       Social Determinants of Health   Financial Resource Strain: Not on file  Food Insecurity: Not on file  Transportation Needs: Not on file  Physical Activity: Not on file  Stress: Not on file  Social Connections: Not on file  Intimate Partner Violence: Not on file   Current Outpatient Medications on File Prior to Visit  Medication Sig Dispense Refill   Accu-Chek Softclix Lancets lancets Use to check blood sugar three times daily E11.65 100 each 5   albuterol (VENTOLIN HFA) 108 (90 Base) MCG/ACT inhaler Inhale 1 to 2 puffs by mouth into the lungs every 6 hours as needed for wheezing or shortness of breath. 18 g 0   aspirin 81 MG EC tablet Take 1 tablet (81 mg total) by mouth daily. Swallow whole. 30 tablet 2   Blood Glucose Monitoring Suppl (ACCU-CHEK GUIDE) w/Device KIT Use to check blood sugar three times daily E11.65 1 kit 0   carvedilol (COREG) 12.5 MG tablet TAKE 1 TABLET (12.5 MG TOTAL) BY MOUTH 2 (TWO) TIMES DAILY WITH A MEAL. 60 tablet 6   cetirizine (ZYRTEC) 10 MG tablet TAKE 1 TABLET (10 MG TOTAL) BY MOUTH DAILY. 30 tablet 0   Cholecalciferol (D3-50) 1.25 MG (50000 UT) capsule Take 1 capsule by mouth once weekly 4 capsule 1   Continuous Blood Gluc Receiver (FREESTYLE LIBRE 2 READER) DEVI Use as directed four to five times daily. 1 each 0   Continuous Blood Gluc Sensor (FREESTYLE LIBRE 2 SENSOR) MISC Use as directed, to montior blood sugar 4 to 5 times a day daily. 2 each 11   Elastic Bandages & Supports (T.E.D. KNEE LENGTH/S-LONG) MISC 1 (one) each daily, apply compression stockings daily to help decrease leg swelling 1 each 0   ferrous sulfate 325 (65 FE) MG tablet Take 1 tablet (325 mg total) by mouth 2 (two) times daily with a meal. 60 tablet 3   furosemide (LASIX) 40 MG tablet Take 1 tablet (40 mg total) by mouth 2 (two) times daily. 60 tablet 6   furosemide (LASIX) 40 MG tablet TAKE 1 TABLET BY MOUTH TWICE  A DAY  180 tablet 3   gabapentin (NEURONTIN) 300 MG capsule Take 1 capsule (300 mg total) by mouth at bedtime. 60 capsule 6   gabapentin (NEURONTIN) 300 MG capsule TAKE 2 CAPSULES BY MOUTH AT BEDTIME 180 capsule 3   Glucagon 3 MG/DOSE POWD Place 3 mg into the nose once as needed for up to 1 dose. 1 each 11   glucose blood (ACCU-CHEK GUIDE) test strip Use to check blood sugar three times daily E11.65 100 each 12   hydrALAZINE (APRESOLINE) 100 MG tablet Take 1 tablet (100 mg total) by mouth 3 (three) times daily. 90 tablet 0   hydrALAZINE (APRESOLINE) 25 MG tablet TAKE 1 TABLET  BY MOUTH THREE TIMES A DAY 270 tablet 3   Insulin Glargine Solostar (LANTUS) 100 UNIT/ML Solostar Pen Inject 12-14 Units into the skin daily. 15 mL 3   Insulin Pen Needle (BD PEN NEEDLE NANO U/F) 32G X 4 MM MISC USE TO INJECT LANTUS DAILY. MUST USE NEW PEN NEEDLE WITH EACH INJECTION. 100 each 3   isosorbide mononitrate (IMDUR) 30 MG 24 hr tablet Take 1 tablet (30 mg total) by mouth daily. 30 tablet 6   lisinopril (ZESTRIL) 10 MG tablet Take 10 mg by mouth daily.     lisinopril (ZESTRIL) 10 MG tablet TAKE 1 TABLET BY MOUTH ONCE A DAY 90 tablet 3   ondansetron (ZOFRAN) 4 MG tablet Take 1 tablet (4 mg total) by mouth every 6 (six) hours as needed for nausea or vomiting. 12 tablet 0   potassium chloride SA (KLOR-CON M) 20 MEQ tablet Take 1 tablet (20 mEq total) by mouth 2 (two) times daily. 180 tablet 3   potassium chloride SA (KLOR-CON M) 20 MEQ tablet TAKE 2 TABLETS BY MOUTH DAILY 180 tablet 3   rosuvastatin (CRESTOR) 40 MG tablet TAKE 1 TABLET (40 MG TOTAL) BY MOUTH DAILY. 90 tablet 1   Semaglutide,0.25 or 0.5MG/DOS, (OZEMPIC, 0.25 OR 0.5 MG/DOSE,) 2 MG/3ML SOPN Inject 0.25 mg into the skin each morning on Sunday's once per week  for 4 weeks. Then, increase to 0.5 mg if no nausea or hypoglycemia 9 mL 3   tamsulosin (FLOMAX) 0.4 MG CAPS capsule TAKE 1 CAPSULE BY MOUTH AT BEDTIME (STOP IF DIZZINESS OCCURS) 90 capsule 3   [DISCONTINUED]  amLODipine (NORVASC) 10 MG tablet Take 1 tablet (10 mg total) by mouth daily. 30 tablet 0   No current facility-administered medications on file prior to visit.   No Known Allergies Family History  Problem Relation Age of Onset   Diabetes Mother    Hypertension Mother    Diabetes Father    Heart attack Father    Diabetes Sister    Heart attack Maternal Grandmother    PE: There were no vitals taken for this visit. Wt Readings from Last 3 Encounters:  04/18/22 222 lb 12.8 oz (101.1 kg)  04/12/22 220 lb 6.4 oz (100 kg)  03/31/22 215 lb (97.5 kg)   Constitutional: overweight, in NAD, but dozing off during the appointment; walks with a cane; had difficulty rising from the chair, had to be helped by wife Eyes: EOMI, no exophthalmos ENT: moist mucous membranes, no thyromegaly, no cervical lymphadenopathy Cardiovascular: RRR, No MRG, B mild periankle edema Respiratory: CTA B Musculoskeletal: no deformities except first to right toes amputated, strength intact in all 4 Skin: moist, warm, no rashes Neurological: no tremor with outstretched hands, DTR normal in all 4  ASSESSMENT: 1. DM2, insulin-dependent, uncontrolled, with complications - CAD -  diastolic and systolic CHF - CVAs - CKD stage 3 - DR - peripheral neuropathy, s/p amputations 1st and 2nd R toes - recurring hypoglycemia -admitted with hypoglycemia induced encephalopathy on 03/10/2022 - glu of 40 and was unresponsive.  Admitted again with hypoglycemia at 47 on 03/31/2022.  2. HL  PLAN:  1. Patient with longstanding, uncontrolled, type 2 diabetes, on basal/bolus insulin regimen with recurring episodes of hypoglycemia.  At last visit, which was our first visit, patient was somnolent and history was offered by his wife.  He was on Neurontin at that time.  He continues this medication.   -Reviewing the CGM downloads at last visit, his sugars were dropping overnight, occasionally under 70s and even to the 50s and 40s.  Some of  the lows were occurring after high blood sugars after dinner, but not always and the drops in blood sugars continued despite not taking any NovoLog since his previous discharge.  Therefore, we discussed about reducing the Lantus dose further.  I suspected that his propensity to hypoglycemia exacerbated after his kidney function started to decline more (he has CKD stage IV).  Sugars were increasing during the day but it was difficult to discern whether these were caused by poor diet/snacking between meals or rebound from hypoglycemia.  I did not suggest to add back NovoLog at that time but I recommended to try Ozempic.  We also discussed about stopping sweet drinks and switching from high glycemic index fruits (like grapes) to lower glycemic index snacks.  I also recommended to change breakfast.  They did make the recommended changes since last visit. -At last visit I called in [place] and advised him and his wife how to use it -At today's visit, he is off his CGM.  I advised him to restart it if possible.  However, per wife's recall, sugars are much improved.  Also, he has no more hypoglycemic episodes.  They are at goal in the morning and slightly higher later in the day, but she suspects that the higher blood sugars are obtained after he has sips of regular sodas.  We discussed about stopping these, but otherwise, especially in the setting of such an improved HbA1c, I did not suggest a change in regimen. - I suggested to:  Patient Instructions  Please continue: - Lantus 12 units in a.m. - Ozempic 0.5 mg weekly in a.m.  Stop sodas!  Please return in 3-4 months.  - we checked his HbA1c: 6.8% (much improved) - advised to check sugars at different times of the day - 4x a day, rotating check times - advised for yearly eye exams >> he is UTD - return to clinic in 3 months   2. HL - Reviewed latest lipid panel from 06/2021: All fractions abnormal: Lab Results  Component Value Date   CHOL 201 (H)  07/02/2021   HDL 36 (L) 07/02/2021   LDLCALC 120 (H) 07/02/2021   TRIG 225 (H) 07/02/2021   CHOLHDL 5.6 07/02/2021  - Continues Crestor 40 mg daily without side effects. - He is due for another lipid panel -has an appointment with PCP 07/11/2022.  [name], MD PhD [facility] Endocrinology

## 2022-06-28 NOTE — Patient Instructions (Addendum)
Please continue: - Lantus 12 units in a.m. - Ozempic 0.5 mg weekly in a.m.  Stop sodas!  Please return in 3-4 months.

## 2022-07-04 ENCOUNTER — Encounter (HOSPITAL_COMMUNITY): Payer: Self-pay | Admitting: Ophthalmology

## 2022-07-04 NOTE — Progress Notes (Signed)
PCP - Cletis [place], PA Cardiologist - Dr Alfonzo Beers Device - Dr Virl Axe  Chest x-ray - 03/31/22 (1V) EKG - 03/31/22 Stress Test - 10/04/11 CE ECHO - 03/31/22 Cardiac Cath - 01/31/17  ICD - Medtronic Single ICD implanted 01/2018.  Last remote check was 04/15/22 which was normal.  Next remote check in 91 days per Dr Caryl Comes.  Emailed [employer] Reg Leanna Sato and copied Lindsi with surgical time and surgical procedure. Called [employer], Leanna Sato is not on call (it's after 5 PM).  Spoke with Vee who took a message with surgery start time and surgery information.  I requested Leanna Sato to arrive at [facility] at 4:30 PM for the 5:13 PM surgery..   Sleep Study -  n/a CPAP - none  Do not take Ozempic on the morning of surgery.     THE MORNING OF SURGERY, take 6-7 Units Lantus Insulin.   If your blood sugar is less than 70 mg/dL, you will need to treat for low blood sugar: Treat a low blood sugar (less than 70 mg/dL) with  cup of clear juice (cranberry or apple), Recheck blood sugar in 15 minutes after treatment (to make sure it is greater than 70 mg/dL). If your blood sugar is not greater than 70 mg/dL on recheck, call [phone] for further instructions.  Aspirin Instructions: Last dose of ASA was on 05/29/22.    ERAS: Per Patient, MD instructed patient that he could eat and drink up to 8 AM on DOS.  NPO after 8 AM on DOS.    Anesthesia review: Yes, ok for surgery on 07/05/22 per Dr Glennon Mac.  STOP now taking any Aspirin (unless otherwise instructed by your surgeon), Aleve, Naproxen, Ibuprofen, Motrin, Advil, Goody's, BC's, all herbal medications, fish oil, and all vitamins.   Coronavirus Screening Do you have any of the following symptoms:  Cough yes/no: No Fever (>100.28F)  yes/no: No Runny nose yes/no: No Sore throat yes/no: No Difficulty breathing/shortness of breath  yes/no: No  Have you traveled in the last 14 days and where? yes/no: No  Patient verbalized understanding of instructions that were  given via phone.

## 2022-07-04 NOTE — H&P (Signed)
Date of examination:  07/05/2022  Indication for surgery: tractional retinal detachment left eye   Pertinent past medical history:  Past Medical History:  Diagnosis Date   AICD (automatic cardioverter/defibrillator) present    Medtronic   AICD (automatic cardioverter/defibrillator) present    MDT Visia AF MRI   Anemia    CAD (coronary artery disease)    a. cath 01/31/17: 60% 1st RPLB, 60% dist RCA, 55% prox RCA, 10% pro LAD --> Rx TX.    CHF (congestive heart failure) (HCC)    Chronic systolic CHF (congestive heart failure) ([place]) 01/28/2017   1. Echo 01/29/17:  EF 20-25, normal wall motion, mild LAE // 2. EF 10-15 by [facility] 01/2017    Coronary artery disease    Diabetes mellitus    type II   Diabetes mellitus without complication ([place])    Diabetic foot infection ([place]) 03/2016   RT FOOT   Dyspnea    History of kidney stones    passed   History of kidney stones    HTN (hypertension)    Hyperlipidemia    Hypertension    Myocardial infarction [facility]), although reported, NICM at cath    NICM (nonischemic cardiomyopathy) ([place]) 02/15/2017   1. Mod non-obs CAD on LHC in 01/2017 - CAD does not explain cardiomyopathy   Renal disorder    stage 4    Pertinent ocular history:  proliferative diabetic retinatopathy left eye  Pertinent family history:  Family History  Problem Relation Age of Onset   Diabetes Mother    Hypertension Mother    Diabetes Father    Heart attack Father    Diabetes Sister    Heart attack Maternal Grandmother     General:  Healthy appearing patient in no distress.    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

## 2022-07-05 ENCOUNTER — Encounter (HOSPITAL_COMMUNITY)
Admission: RE | Disposition: A | Discharge status: Home / Self Care | Payer: Self-pay | Source: Home / Self Care | Attending: Ophthalmology

## 2022-07-05 ENCOUNTER — Other Ambulatory Visit: Payer: Self-pay

## 2022-07-05 ENCOUNTER — Ambulatory Visit (HOSPITAL_BASED_OUTPATIENT_CLINIC_OR_DEPARTMENT_OTHER): Payer: Medicare Other | Admitting: Anesthesiology

## 2022-07-05 ENCOUNTER — Encounter (HOSPITAL_COMMUNITY): Payer: Self-pay | Admitting: Ophthalmology

## 2022-07-05 ENCOUNTER — Ambulatory Visit (HOSPITAL_COMMUNITY)
Admission: RE | Admit: 2022-07-05 | Discharge: 2022-07-05 | Disposition: A | Discharge status: Home / Self Care | Payer: Medicare Other | Attending: Ophthalmology | Admitting: Ophthalmology

## 2022-07-05 ENCOUNTER — Ambulatory Visit (HOSPITAL_COMMUNITY): Payer: Medicare Other | Admitting: Anesthesiology

## 2022-07-05 DIAGNOSIS — I2510 Atherosclerotic heart disease of native coronary artery without angina pectoris: Secondary | ICD-10-CM | POA: Diagnosis not present

## 2022-07-05 DIAGNOSIS — I5022 Chronic systolic (congestive) heart failure: Secondary | ICD-10-CM | POA: Insufficient documentation

## 2022-07-05 DIAGNOSIS — Z794 Long term (current) use of insulin: Secondary | ICD-10-CM | POA: Insufficient documentation

## 2022-07-05 DIAGNOSIS — I252 Old myocardial infarction: Secondary | ICD-10-CM | POA: Diagnosis not present

## 2022-07-05 DIAGNOSIS — Z95810 Presence of automatic (implantable) cardiac defibrillator: Secondary | ICD-10-CM | POA: Insufficient documentation

## 2022-07-05 DIAGNOSIS — N184 Chronic kidney disease, stage 4 (severe): Secondary | ICD-10-CM | POA: Diagnosis not present

## 2022-07-05 DIAGNOSIS — Z8673 Personal history of transient ischemic attack (TIA), and cerebral infarction without residual deficits: Secondary | ICD-10-CM | POA: Insufficient documentation

## 2022-07-05 DIAGNOSIS — E113522 Type 2 diabetes mellitus with proliferative diabetic retinopathy with traction retinal detachment involving the macula, left eye: Secondary | ICD-10-CM | POA: Insufficient documentation

## 2022-07-05 DIAGNOSIS — E1122 Type 2 diabetes mellitus with diabetic chronic kidney disease: Secondary | ICD-10-CM | POA: Diagnosis not present

## 2022-07-05 DIAGNOSIS — I130 Hypertensive heart and chronic kidney disease with heart failure and stage 1 through stage 4 chronic kidney disease, or unspecified chronic kidney disease: Secondary | ICD-10-CM | POA: Insufficient documentation

## 2022-07-05 DIAGNOSIS — H3322 Serous retinal detachment, left eye: Secondary | ICD-10-CM

## 2022-07-05 DIAGNOSIS — I509 Heart failure, unspecified: Secondary | ICD-10-CM | POA: Diagnosis not present

## 2022-07-05 DIAGNOSIS — Z01818 Encounter for other preprocedural examination: Secondary | ICD-10-CM

## 2022-07-05 HISTORY — PX: MEMBRANE PEEL: SHX5967

## 2022-07-05 HISTORY — PX: PHOTOCOAGULATION WITH LASER: SHX6027

## 2022-07-05 HISTORY — PX: PARS PLANA VITRECTOMY: SHX2166

## 2022-07-05 HISTORY — PX: INJECTION OF SILICONE OIL: SHX6422

## 2022-07-05 LAB — GLUCOSE, CAPILLARY
Glucose-Capillary: 114 mg/dL — ABNORMAL HIGH (ref 70–99)
Glucose-Capillary: 34 mg/dL — CL (ref 70–99)
Glucose-Capillary: 41 mg/dL — CL (ref 70–99)
Glucose-Capillary: 145 mg/dL — ABNORMAL HIGH (ref 70–99)
Glucose-Capillary: 54 mg/dL — ABNORMAL LOW (ref 70–99)
Glucose-Capillary: 82 mg/dL (ref 70–99)
Glucose-Capillary: 63 mg/dL — ABNORMAL LOW (ref 70–99)
Glucose-Capillary: 94 mg/dL (ref 70–99)

## 2022-07-05 LAB — BASIC METABOLIC PANEL
Anion gap: 9 (ref 5–15)
BUN: 48 mg/dL — ABNORMAL HIGH (ref 6–20)
CO2: 20 mmol/L — ABNORMAL LOW (ref 22–32)
Calcium: 9.3 mg/dL (ref 8.9–10.3)
Chloride: 111 mmol/L (ref 98–111)
Creatinine, Ser: 2.91 mg/dL — ABNORMAL HIGH (ref 0.61–1.24)
GFR, Estimated: 24 mL/min — ABNORMAL LOW (ref >60)
Glucose, Bld: 43 mg/dL — CL (ref 70–99)
Potassium: 4.4 mmol/L (ref 3.5–5.1)
Sodium: 140 mmol/L (ref 135–145)

## 2022-07-05 SURGERY — PARS PLANA VITRECTOMY WITH 25 GAUGE
Anesthesia: Monitor Anesthesia Care | Site: Eye | Laterality: Left

## 2022-07-05 MED ORDER — LACTATED RINGERS IV SOLN: INTRAVENOUS | Status: DC | PRN

## 2022-07-05 MED ORDER — BRILLIANT BLUE G 0.025 % IO SOSY
PREFILLED_SYRINGE | INTRAOCULAR | Status: DC | PRN
  Administered 2022-07-05: 0.5 mL via INTRAVITREAL

## 2022-07-05 MED ORDER — DEXTROSE 50 % IV SOLN
INTRAVENOUS | Status: AC
  Administered 2022-07-05: 50 mL via INTRAVENOUS
  Filled 2022-07-05: qty 50

## 2022-07-05 MED ORDER — ATROPINE SULFATE 1 % OP SOLN
OPHTHALMIC | Status: AC
  Filled 2022-07-05: qty 5

## 2022-07-05 MED ORDER — TROPICAMIDE 1 % OP SOLN
1.0000 [drp] | OPHTHALMIC | Status: AC | PRN
  Administered 2022-07-05 (×3): 1 [drp] via OPHTHALMIC
  Filled 2022-07-05: qty 15

## 2022-07-05 MED ORDER — DEXTROSE 50 % IV SOLN
INTRAVENOUS | Status: DC | PRN
  Administered 2022-07-05 (×2): .5 via INTRAVENOUS

## 2022-07-05 MED ORDER — CEFAZOLIN SUBCONJUNCTIVAL INJECTION 100 MG/0.5 ML
INJECTION | SUBCONJUNCTIVAL | Status: DC | PRN
  Administered 2022-07-05: 100 mg via SUBCONJUNCTIVAL

## 2022-07-05 MED ORDER — PHENYLEPHRINE HCL 2.5 % OP SOLN
1.0000 [drp] | OPHTHALMIC | Status: AC | PRN
  Administered 2022-07-05 (×3): 1 [drp] via OPHTHALMIC
  Filled 2022-07-05: qty 2

## 2022-07-05 MED ORDER — DEXTROSE 50 % IV SOLN
INTRAVENOUS | Status: AC
  Administered 2022-07-05: 25 g via INTRAVENOUS
  Filled 2022-07-05: qty 50

## 2022-07-05 MED ORDER — SODIUM HYALURONATE 10 MG/ML IO SOLUTION
PREFILLED_SYRINGE | INTRAOCULAR | Status: AC
Start: 2022-07-05 — End: ?
  Filled 2022-07-05: qty 0.85

## 2022-07-05 MED ORDER — FENTANYL CITRATE (PF) 250 MCG/5ML IJ SOLN
INTRAMUSCULAR | Status: DC | PRN
  Administered 2022-07-05: 25 ug via INTRAVENOUS
  Administered 2022-07-05: 50 ug via INTRAVENOUS
  Administered 2022-07-05 (×2): 25 ug via INTRAVENOUS
  Administered 2022-07-05: 50 ug via INTRAVENOUS
  Administered 2022-07-05: 25 ug via INTRAVENOUS

## 2022-07-05 MED ORDER — LABETALOL HCL 5 MG/ML IV SOLN
INTRAVENOUS | Status: DC | PRN
  Administered 2022-07-05: 10 mg via INTRAVENOUS
  Administered 2022-07-05 (×2): 5 mg via INTRAVENOUS

## 2022-07-05 MED ORDER — BUPIVACAINE HCL (PF) 0.75 % IJ SOLN
INTRAMUSCULAR | Status: AC
Start: 2022-07-05 — End: ?
  Filled 2022-07-05: qty 10

## 2022-07-05 MED ORDER — CEFAZOLIN SODIUM-DEXTROSE 2-4 GM/100ML-% IV SOLN
2.0000 g | Freq: Once | INTRAVENOUS | Status: DC
Start: 2022-07-05 — End: 2022-07-05
  Filled 2022-07-05: qty 100

## 2022-07-05 MED ORDER — DEXAMETHASONE SODIUM PHOSPHATE 10 MG/ML IJ SOLN
INTRAMUSCULAR | Status: AC
Start: 2022-07-05 — End: ?
  Filled 2022-07-05: qty 1

## 2022-07-05 MED ORDER — OFLOXACIN 0.3 % OP SOLN
1.0000 [drp] | OPHTHALMIC | Status: AC | PRN
  Administered 2022-07-05 (×3): 1 [drp] via OPHTHALMIC
  Filled 2022-07-05: qty 5

## 2022-07-05 MED ORDER — EPINEPHRINE PF 1 MG/ML IJ SOLN
INTRAOCULAR | Status: DC | PRN
  Administered 2022-07-05: 500.3 mL

## 2022-07-05 MED ORDER — EPINEPHRINE PF 1 MG/ML IJ SOLN
INTRAMUSCULAR | Status: AC
Start: 2022-07-05 — End: ?
  Filled 2022-07-05: qty 1

## 2022-07-05 MED ORDER — SODIUM CHLORIDE 0.9 % IV SOLN: INTRAVENOUS | Status: DC

## 2022-07-05 MED ORDER — ACETAMINOPHEN 500 MG PO TABS
1000.0000 mg | ORAL_TABLET | Freq: Once | ORAL | Status: AC
  Administered 2022-07-05: 1000 mg via ORAL

## 2022-07-05 MED ORDER — NA CHONDROIT SULF-NA HYALURON 40-30 MG/ML IO SOSY
INTRAOCULAR | Status: DC | PRN
  Administered 2022-07-05: 0.5 mL via INTRAOCULAR

## 2022-07-05 MED ORDER — BSS IO SOLN
INTRAOCULAR | Status: AC
Start: 2022-07-05 — End: ?
  Filled 2022-07-05: qty 15

## 2022-07-05 MED ORDER — NA CHONDROIT SULF-NA HYALURON 40-30 MG/ML IO SOSY
INTRAOCULAR | Status: AC
Start: 2022-07-05 — End: ?
  Filled 2022-07-05: qty 0.5

## 2022-07-05 MED ORDER — BRILLIANT BLUE G 0.025 % IO SOSY
0.5000 mL | PREFILLED_SYRINGE | INTRAOCULAR | Status: DC
  Filled 2022-07-05: qty 0.5

## 2022-07-05 MED ORDER — TOBRAMYCIN-DEXAMETHASONE 0.3-0.1 % OP OINT
TOPICAL_OINTMENT | OPHTHALMIC | Status: AC
Start: 2022-07-05 — End: ?
  Filled 2022-07-05: qty 3.5

## 2022-07-05 MED ORDER — CYCLOPENTOLATE HCL 1 % OP SOLN
1.0000 [drp] | OPHTHALMIC | Status: AC | PRN
  Administered 2022-07-05 (×3): 1 [drp] via OPHTHALMIC
  Filled 2022-07-05: qty 2

## 2022-07-05 MED ORDER — BSS IO SOLN
INTRAOCULAR | Status: DC | PRN
  Administered 2022-07-05: 15 mL

## 2022-07-05 MED ORDER — BSS PLUS IO SOLN
INTRAOCULAR | Status: AC
Start: 2022-07-05 — End: ?
  Filled 2022-07-05: qty 500

## 2022-07-05 MED ORDER — HYALURONIDASE HUMAN 150 UNIT/ML IJ SOLN
INTRAMUSCULAR | Status: AC
  Filled 2022-07-05: qty 1

## 2022-07-05 MED ORDER — LIDOCAINE HCL (CARDIAC) PF 100 MG/5ML IV SOSY
PREFILLED_SYRINGE | INTRAVENOUS | Status: DC | PRN
  Administered 2022-07-05: 60 mg via INTRAVENOUS

## 2022-07-05 MED ORDER — PROPARACAINE HCL 0.5 % OP SOLN
1.0000 [drp] | OPHTHALMIC | Status: AC | PRN
  Administered 2022-07-05 (×3): 1 [drp] via OPHTHALMIC
  Filled 2022-07-05: qty 15

## 2022-07-05 MED ORDER — DEXTROSE 50 % IV SOLN
25.0000 g | INTRAVENOUS | Status: AC
  Filled 2022-07-05: qty 50

## 2022-07-05 MED ORDER — DEXAMETHASONE SODIUM PHOSPHATE 10 MG/ML IJ SOLN
INTRAMUSCULAR | Status: DC | PRN
  Administered 2022-07-05: 10 mg

## 2022-07-05 MED ORDER — MIDAZOLAM HCL 2 MG/2ML IJ SOLN
INTRAMUSCULAR | Status: AC
  Filled 2022-07-05: qty 2

## 2022-07-05 MED ORDER — LIDOCAINE HCL 2 % IJ SOLN
INTRAMUSCULAR | Status: AC
  Filled 2022-07-05: qty 20

## 2022-07-05 MED ORDER — PROPOFOL 10 MG/ML IV BOLUS
INTRAVENOUS | Status: DC | PRN
  Administered 2022-07-05: 20 mg via INTRAVENOUS
  Administered 2022-07-05: 40 mg via INTRAVENOUS

## 2022-07-05 MED ORDER — CHLORHEXIDINE GLUCONATE 0.12 % MT SOLN
15.0000 mL | OROMUCOSAL | Status: AC
  Administered 2022-07-05: 15 mL via OROMUCOSAL
  Filled 2022-07-05 (×2): qty 15

## 2022-07-05 MED ORDER — LABETALOL HCL 5 MG/ML IV SOLN
INTRAVENOUS | Status: AC
  Filled 2022-07-05: qty 4

## 2022-07-05 MED ORDER — FENTANYL CITRATE (PF) 100 MCG/2ML IJ SOLN: 25.0000 ug | INTRAMUSCULAR | Status: DC | PRN

## 2022-07-05 MED ORDER — LIDOCAINE HCL 2 % IJ SOLN
INTRAMUSCULAR | Status: DC | PRN
  Administered 2022-07-05: 6 mL via RETROBULBAR

## 2022-07-05 MED ORDER — ACETAMINOPHEN 500 MG PO TABS
ORAL_TABLET | ORAL | Status: AC
  Filled 2022-07-05: qty 2

## 2022-07-05 MED ORDER — TOBRAMYCIN-DEXAMETHASONE 0.3-0.1 % OP OINT
TOPICAL_OINTMENT | OPHTHALMIC | Status: DC | PRN
  Administered 2022-07-05: 1 via OPHTHALMIC

## 2022-07-05 MED ORDER — INSULIN ASPART 100 UNIT/ML IJ SOLN: 0.0000 [IU] | INTRAMUSCULAR | Status: DC | PRN

## 2022-07-05 MED ORDER — FENTANYL CITRATE (PF) 250 MCG/5ML IJ SOLN
INTRAMUSCULAR | Status: AC
  Filled 2022-07-05: qty 5

## 2022-07-05 MED ORDER — CEFAZOLIN SUBCONJUNCTIVAL INJECTION 100 MG/0.5 ML
100.0000 mg | INJECTION | SUBCONJUNCTIVAL | Status: DC
  Filled 2022-07-05: qty 5

## 2022-07-05 MED ORDER — DEXTROSE 50 % IV SOLN: 1.0000 | Freq: Once | INTRAVENOUS | Status: AC

## 2022-07-05 SURGICAL SUPPLY — 72 items
APL SWBSTK 6 STRL LF DISP (MISCELLANEOUS) ×1
APPLICATOR COTTON TIP 6 STRL (MISCELLANEOUS) ×2 IMPLANT
APPLICATOR COTTON TIP 6IN STRL (MISCELLANEOUS) ×2 IMPLANT
BAND WRIST GAS GREEN (MISCELLANEOUS) IMPLANT
BLADE MVR KNIFE 20G (BLADE) IMPLANT
BLADE STAB KNIFE 15DEG (BLADE) IMPLANT
CABLE BIPOLOR RESECTION CORD (MISCELLANEOUS) ×1 IMPLANT
CANNULA ANT CHAM MAIN (OPHTHALMIC RELATED) IMPLANT
CANNULA DUAL BORE 23G (CANNULA) IMPLANT
CANNULA DUALBORE 25G (CANNULA) IMPLANT
CANNULA VLV SOFT TIP 25G (OPHTHALMIC) ×2 IMPLANT
CANNULA VLV SOFT TIP 25GA (OPHTHALMIC) ×4 IMPLANT
CAUTERY EYE LOW TEMP 1300F FIN (OPHTHALMIC RELATED) IMPLANT
CLSR STERI-STRIP ANTIMIC 1/2X4 (GAUZE/BANDAGES/DRESSINGS) ×3 IMPLANT
DRAPE HALF SHEET 40X57 (DRAPES) ×3 IMPLANT
DRAPE INCISE 51X51 W/FILM STRL (DRAPES) IMPLANT
DRAPE RETRACTOR (MISCELLANEOUS) ×3 IMPLANT
FORCEPS ECKARDT ILM 25G SERR (OPHTHALMIC RELATED) IMPLANT
FORCEPS GRIESHABER ILM 25G A (INSTRUMENTS) IMPLANT
GAS AUTO FILL CONSTEL (OPHTHALMIC)
GAS AUTO FILL CONSTELLATION (OPHTHALMIC) IMPLANT
GAS WRIST BAND GREEN (MISCELLANEOUS) ×2
GLOVE SURG SYN 7.5  E (GLOVE) ×2
GLOVE SURG SYN 7.5 E (GLOVE) ×1 IMPLANT
GLOVE SURG SYN 7.5 PF PI (GLOVE) ×2 IMPLANT
GOWN STRL REUS W/ TWL LRG LVL3 (GOWN DISPOSABLE) ×2 IMPLANT
GOWN STRL REUS W/TWL LRG LVL3 (GOWN DISPOSABLE) ×2
KIT BASIN OR (CUSTOM PROCEDURE TRAY) ×3 IMPLANT
KIT TURNOVER KIT B (KITS) ×3 IMPLANT
LENS BIOM SUPER VIEW SET DISP (MISCELLANEOUS) ×3 IMPLANT
MICROPICK 25G (MISCELLANEOUS)
NDL 18GX1X1/2 (RX/OR ONLY) (NEEDLE) ×2 IMPLANT
NDL 25GX 5/8IN NON SAFETY (NEEDLE) ×2 IMPLANT
NDL 27GX1/2 REG BEVEL ECLIP (NEEDLE) ×2 IMPLANT
NDL FILTER BLUNT 18X1 1/2 (NEEDLE) ×2 IMPLANT
NDL HYPO 25GX1X1/2 BEV (NEEDLE) IMPLANT
NDL HYPO 30X.5 LL (NEEDLE) ×4 IMPLANT
NDL RETROBULBAR 25GX1.5 (NEEDLE) ×2 IMPLANT
NEEDLE 18GX1X1/2 (RX/OR ONLY) (NEEDLE) ×2 IMPLANT
NEEDLE 25GX 5/8IN NON SAFETY (NEEDLE) ×2 IMPLANT
NEEDLE 27GX1/2 REG BEVEL ECLIP (NEEDLE) ×2 IMPLANT
NEEDLE FILTER BLUNT 18X 1/2SAF (NEEDLE) ×1
NEEDLE FILTER BLUNT 18X1 1/2 (NEEDLE) ×1 IMPLANT
NEEDLE HYPO 25GX1X1/2 BEV (NEEDLE) IMPLANT
NEEDLE HYPO 30X.5 LL (NEEDLE) ×4 IMPLANT
NEEDLE RETROBULBAR 25GX1.5 (NEEDLE) ×4 IMPLANT
NS IRRIG 1000ML POUR BTL (IV SOLUTION) ×3 IMPLANT
OIL SILICONE OPHTHALMIC 1000 (Ophthalmic Related) ×1 IMPLANT
PACK FRAGMATOME (OPHTHALMIC) IMPLANT
PACK VITRECTOMY CUSTOM (CUSTOM PROCEDURE TRAY) ×3 IMPLANT
PAD ARMBOARD 7.5X6 YLW CONV (MISCELLANEOUS) ×6 IMPLANT
PAK PIK VITRECTOMY CVS 25GA (OPHTHALMIC) ×3 IMPLANT
PENCIL BIPOLAR 25GA STR DISP (OPHTHALMIC RELATED) ×1 IMPLANT
PIC ILLUMINATED 25G (OPHTHALMIC) ×2
PICK MICROPICK 25G (MISCELLANEOUS) IMPLANT
PIK ILLUMINATED 25G (OPHTHALMIC) IMPLANT
PROBE CONSTELLATION 25 GA PLUS (OPHTHALMIC) ×1 IMPLANT
PROBE ENDO DIATHERMY 25G (MISCELLANEOUS) ×3 IMPLANT
PROBE LASER ILLUM FLEX CVD 25G (OPHTHALMIC) IMPLANT
ROLLS DENTAL (MISCELLANEOUS) IMPLANT
SCRAPER DIAMOND 25GA (OPHTHALMIC RELATED) ×1 IMPLANT
SET INJECTOR OIL FLUID CONSTEL (OPHTHALMIC) ×1 IMPLANT
SOL ANTI FOG 6CC (MISCELLANEOUS) ×2 IMPLANT
SOLUTION ANTI FOG 6CC (MISCELLANEOUS) ×1
STOPCOCK 4 WAY LG BORE MALE ST (IV SETS) IMPLANT
SUT VICRYL 7 0 TG140 8 (SUTURE) ×3 IMPLANT
SUT VICRYL 8 0 TG140 8 (SUTURE) IMPLANT
SYR 10ML LL (SYRINGE) IMPLANT
SYR 20ML LL LF (SYRINGE) ×3 IMPLANT
SYR 5ML LL (SYRINGE) ×3 IMPLANT
SYR TB 1ML LUER SLIP (SYRINGE) IMPLANT
WATER STERILE IRR 1000ML POUR (IV SOLUTION) ×3 IMPLANT

## 2022-07-05 NOTE — Brief Op Note (Signed)
07/05/2022  9:23 PM  PATIENT:  Jeffrey [name]  56 y.o. male  PRE-OPERATIVE DIAGNOSIS:  Tractional retinal detachment left eye  POST-OPERATIVE DIAGNOSIS:  Tractional retinal detachment left eye  PROCEDURE:  Procedure(s): LEFT PARS PLANA VITRECTOMY WITH 25 GAUGE (Left) PHOTOCOAGULATION WITH LASER (Left) INJECTION OF SILICONE OIL (Left) MEMBRANECTOMY (Left)  SURGEON:  Surgeon(s) and Role:    * Jalene Mullet, MD - Primary  PHYSICIAN ASSISTANT:   ASSISTANTS: none   ANESTHESIA:   local and MAC  EBL:  minimal   BLOOD ADMINISTERED:none  DRAINS: none   LOCAL MEDICATIONS USED:  MARCAINE    and LIDOCAINE   SPECIMEN:  No Specimen  DISPOSITION OF SPECIMEN:  N/A  COUNTS:  YES  TOURNIQUET:  * No tourniquets in log *  DICTATION: .Note written in EPIC  PLAN OF CARE: Discharge to home after PACU  PATIENT DISPOSITION:  PACU - hemodynamically stable.   Delay start of Pharmacological VTE agent (>24hrs) due to surgical blood loss or risk of bleeding: not applicable

## 2022-07-05 NOTE — Progress Notes (Signed)
CRITICAL RESULT PROVIDER NOTIFICATION  Test performed and critical result:  Glucose 43 at [facility] test  Date and time result received:  07/05/22 @1645   Provider name/title: Dr Alfonso Patten. Fitzgerald  Date and time provider notified: 07/05/22 @1650  notified in person.  Date and time provider responded: 1650  Provider response:At bedside   Blood sugar checked, 34.  1 amp D50 was given. See eMAR.  Latest CBG 114 @1600 .

## 2022-07-05 NOTE — Transfer of Care (Signed)
Immediate Anesthesia Transfer of Care Note  Patient: Jeffrey [name]  Procedure(s) Performed: LEFT PARS PLANA VITRECTOMY WITH 25 GAUGE (Left: Eye) PHOTOCOAGULATION WITH LASER (Left: Eye) INJECTION OF SILICONE OIL (Left: Eye) MEMBRANECTOMY (Left: Eye)  Patient Location: PACU  Anesthesia Type:MAC  Level of Consciousness: awake, alert  and oriented  Airway & Oxygen Therapy: Patient Spontanous Breathing  Post-op Assessment: Report given to RN and Post -op Vital signs reviewed and stable  Post vital signs: Reviewed and stable  Last Vitals:  Vitals Value Taken Time  BP 151/94 07/05/22 2135  Temp 98.0   Pulse 73 07/05/22 2137  Resp 11 07/05/22 2137  SpO2 100 % 07/05/22 2137  Vitals shown include unvalidated device data.  Last Pain:  Vitals:   07/05/22 1548  TempSrc: Oral  PainSc: 0-No pain      Patients Stated Pain Goal: 3 (70/26/37 8588)  Complications: No notable events documented.

## 2022-07-05 NOTE — Progress Notes (Signed)
Wife [name] back to [facility] to sit with patient.  Patient Jeffrey [name] gave his wallet to his wife in [place] to keep.

## 2022-07-05 NOTE — Anesthesia Preprocedure Evaluation (Addendum)
Anesthesia Evaluation  Patient identified by MRN, date of birth, ID band Patient awake    Reviewed: Allergy & Precautions, NPO status , [name] Chart, lab work & pertinent test results  Airway Mallampati: II  TM Distance: >3 FB Neck ROM: Full    Dental  (+) Dental Advisory Given   Pulmonary neg pulmonary ROS,    breath sounds clear to auscultation       Cardiovascular hypertension, Pt. on medications and Pt. on home beta blockers + CAD, + Past MI and +CHF  + pacemaker + Cardiac Defibrillator  Rhythm:Regular Rate:Normal  EF 50-55% 03/2022. Valves WNL    Neuro/Psych Seizures -,  CVA    GI/Hepatic negative GI ROS, Neg liver ROS,   Endo/Other  diabetes, Insulin Dependent  Renal/GU CRFRenal disease     Musculoskeletal   Abdominal   Peds  Hematology   Anesthesia Other Findings   Reproductive/Obstetrics                             Anesthesia Physical Anesthesia Plan  ASA: 3  Anesthesia Plan: MAC   Post-op Pain Management: Tylenol PO (pre-op)*   Induction:   PONV Risk Score and Plan: 1 and Propofol infusion, Ondansetron and Treatment may vary due to age or medical condition  Airway Management Planned: Natural Airway and Nasal Cannula  Additional Equipment:   Intra-op Plan:   Post-operative Plan:   Informed Consent: I have reviewed the patients History and Physical, chart, labs and discussed the procedure including the risks, benefits and alternatives for the proposed anesthesia with the patient or authorized representative who has indicated his/her understanding and acceptance.       Plan Discussed with: CRNA  Anesthesia Plan Comments:         Anesthesia Quick Evaluation

## 2022-07-05 NOTE — Discharge Instructions (Signed)
DO NOT SLEEP ON BACK, THE EYE PRESSURE CAN GO UP AND CAUSE VISION LOSS   SLEEP ON SIDE WITH NOSE TO PILLOW  DURING DAY KEEP UPRIGHT 

## 2022-07-06 ENCOUNTER — Encounter (HOSPITAL_COMMUNITY): Payer: Self-pay | Admitting: Ophthalmology

## 2022-07-06 NOTE — Anesthesia Postprocedure Evaluation (Signed)
Anesthesia Post Note  Patient: Jeffrey [name]  Procedure(s) Performed: LEFT PARS PLANA VITRECTOMY WITH 25 GAUGE (Left: Eye) PHOTOCOAGULATION WITH LASER (Left: Eye) INJECTION OF SILICONE OIL (Left: Eye) MEMBRANECTOMY (Left: Eye)     Patient location during evaluation: PACU Anesthesia Type: MAC Level of consciousness: awake Pain management: pain level controlled Vital Signs Assessment: post-procedure vital signs reviewed and stable Respiratory status: spontaneous breathing, nonlabored ventilation, respiratory function stable and patient connected to nasal cannula oxygen Cardiovascular status: stable and blood pressure returned to baseline Postop Assessment: no apparent nausea or vomiting Anesthetic complications: no   No notable events documented.  Last Vitals:  Vitals:   07/05/22 2155 07/05/22 2210  BP: (!) 162/110 (!) 155/92  Pulse: 75 74  Resp: 15 16  Temp:    SpO2: 96% 99%    Last Pain:  Vitals:   07/05/22 2210  TempSrc:   PainSc: 0-No pain                 Jeffrey [name] P Jeffrey [name]

## 2022-07-08 ENCOUNTER — Other Ambulatory Visit: Payer: Self-pay

## 2022-07-08 ENCOUNTER — Other Ambulatory Visit: Payer: Self-pay | Admitting: Internal Medicine

## 2022-07-08 MED ORDER — PREDNISOLONE ACETATE 1 % OP SUSP
OPHTHALMIC | 0 refills | Status: DC
  Filled 2022-07-08: qty 10, 20d supply, fill #0

## 2022-07-08 MED ORDER — OFLOXACIN 0.3 % OP SOLN
OPHTHALMIC | 0 refills | Status: DC
  Filled 2022-07-08: qty 5, 7d supply, fill #0

## 2022-07-08 MED ORDER — INSULIN GLARGINE SOLOSTAR 100 UNIT/ML ~~LOC~~ SOPN
12.0000 [IU] | PEN_INJECTOR | Freq: Every day | SUBCUTANEOUS | 3 refills | Status: DC
Start: 2022-07-08 — End: 2022-08-03
  Filled 2022-07-08 – 2022-07-28 (×3): qty 15, 107d supply, fill #0
  Filled ????-??-??: fill #0

## 2022-07-11 ENCOUNTER — Other Ambulatory Visit: Payer: Self-pay

## 2022-07-11 MED ORDER — HYDRALAZINE HCL 50 MG PO TABS
ORAL_TABLET | ORAL | 3 refills | Status: DC
  Filled 2022-07-11: qty 270, 90d supply, fill #0

## 2022-07-11 MED ORDER — TAMSULOSIN HCL 0.4 MG PO CAPS
ORAL_CAPSULE | ORAL | 3 refills | Status: DC
  Filled 2022-07-11: qty 90, 90d supply, fill #0

## 2022-07-11 MED ORDER — GABAPENTIN 300 MG PO CAPS
ORAL_CAPSULE | ORAL | 3 refills | Status: DC
  Filled 2022-07-11: qty 180, 90d supply, fill #0
  Filled 2022-10-17: qty 180, 90d supply, fill #1

## 2022-07-11 MED ORDER — LISINOPRIL 10 MG PO TABS
ORAL_TABLET | ORAL | 3 refills | Status: DC
  Filled 2022-07-11: qty 90, 90d supply, fill #0

## 2022-07-11 MED ORDER — POTASSIUM CHLORIDE CRYS ER 20 MEQ PO TBCR
EXTENDED_RELEASE_TABLET | ORAL | 3 refills | Status: DC
  Filled 2022-07-11: qty 180, 90d supply, fill #0

## 2022-07-11 MED ORDER — FUROSEMIDE 40 MG PO TABS
ORAL_TABLET | ORAL | 3 refills | Status: DC
  Filled 2022-07-11: qty 180, 90d supply, fill #0

## 2022-07-15 ENCOUNTER — Ambulatory Visit (INDEPENDENT_AMBULATORY_CARE_PROVIDER_SITE_OTHER): Payer: Medicare Other

## 2022-07-15 DIAGNOSIS — I428 Other cardiomyopathies: Secondary | ICD-10-CM

## 2022-07-15 LAB — CUP PACEART REMOTE DEVICE CHECK
Battery Remaining Longevity: 79 mo
Battery Voltage: 2.99 V
Brady Statistic RV Percent Paced: 0 %
Date Time Interrogation Session: 20230804105803
HighPow Impedance: 60 Ohm
Implantable Lead Implant Date: 20190204
Implantable Lead Location: 753860
Implantable Pulse Generator Implant Date: 20190204
Lead Channel Impedance Value: 266 Ohm
Lead Channel Impedance Value: 342 Ohm
Lead Channel Pacing Threshold Amplitude: 0.75 V
Lead Channel Pacing Threshold Pulse Width: 0.4 ms
Lead Channel Sensing Intrinsic Amplitude: 7.875 mV
Lead Channel Sensing Intrinsic Amplitude: 7.875 mV
Lead Channel Setting Pacing Amplitude: 2.5 V
Lead Channel Setting Pacing Pulse Width: 0.4 ms
Lead Channel Setting Sensing Sensitivity: 0.3 mV

## 2022-07-24 ENCOUNTER — Emergency Department (HOSPITAL_COMMUNITY): Payer: Medicare Other

## 2022-07-24 ENCOUNTER — Encounter (HOSPITAL_COMMUNITY): Payer: Self-pay | Admitting: *Deleted

## 2022-07-24 ENCOUNTER — Other Ambulatory Visit: Payer: Self-pay

## 2022-07-24 ENCOUNTER — Emergency Department (HOSPITAL_COMMUNITY)
Admission: EM | Admit: 2022-07-24 | Discharge: 2022-07-25 | Disposition: A | Discharge status: Home / Self Care | Payer: Medicare Other | Attending: Emergency Medicine | Admitting: Emergency Medicine

## 2022-07-24 DIAGNOSIS — S0292XA Unspecified fracture of facial bones, initial encounter for closed fracture: Secondary | ICD-10-CM

## 2022-07-24 DIAGNOSIS — S01111A Laceration without foreign body of right eyelid and periocular area, initial encounter: Secondary | ICD-10-CM | POA: Insufficient documentation

## 2022-07-24 DIAGNOSIS — S0990XA Unspecified injury of head, initial encounter: Secondary | ICD-10-CM

## 2022-07-24 DIAGNOSIS — S0181XA Laceration without foreign body of other part of head, initial encounter: Secondary | ICD-10-CM

## 2022-07-24 DIAGNOSIS — Z794 Long term (current) use of insulin: Secondary | ICD-10-CM | POA: Diagnosis not present

## 2022-07-24 DIAGNOSIS — Z79899 Other long term (current) drug therapy: Secondary | ICD-10-CM | POA: Insufficient documentation

## 2022-07-24 DIAGNOSIS — S02841A Fracture of lateral orbital wall, right side, initial encounter for closed fracture: Secondary | ICD-10-CM | POA: Insufficient documentation

## 2022-07-24 DIAGNOSIS — S0240CA Maxillary fracture, right side, initial encounter for closed fracture: Secondary | ICD-10-CM | POA: Insufficient documentation

## 2022-07-24 DIAGNOSIS — S0993XA Unspecified injury of face, initial encounter: Secondary | ICD-10-CM | POA: Diagnosis present

## 2022-07-24 LAB — COMPREHENSIVE METABOLIC PANEL
ALT: 20 U/L (ref 0–44)
AST: 20 U/L (ref 15–41)
Albumin: 3.7 g/dL (ref 3.5–5.0)
Alkaline Phosphatase: 67 U/L (ref 38–126)
Anion gap: 7 (ref 5–15)
BUN: 42 mg/dL — ABNORMAL HIGH (ref 6–20)
CO2: 19 mmol/L — ABNORMAL LOW (ref 22–32)
Calcium: 9.4 mg/dL (ref 8.9–10.3)
Chloride: 109 mmol/L (ref 98–111)
Creatinine, Ser: 2.69 mg/dL — ABNORMAL HIGH (ref 0.61–1.24)
GFR, Estimated: 27 mL/min — ABNORMAL LOW (ref >60)
Glucose, Bld: 291 mg/dL — ABNORMAL HIGH (ref 70–99)
Potassium: 4.6 mmol/L (ref 3.5–5.1)
Sodium: 135 mmol/L (ref 135–145)
Total Bilirubin: 0.4 mg/dL (ref 0.3–1.2)
Total Protein: 7.5 g/dL (ref 6.5–8.1)

## 2022-07-24 LAB — CBC WITH DIFFERENTIAL/PLATELET
Abs Immature Granulocytes: 0.03 10*3/uL (ref 0.00–0.07)
Basophils Absolute: 0.1 10*3/uL (ref 0.0–0.1)
Basophils Relative: 1 %
Eosinophils Absolute: 0.1 10*3/uL (ref 0.0–0.5)
Eosinophils Relative: 1 %
HCT: 36.9 % — ABNORMAL LOW (ref 39.0–52.0)
Hemoglobin: 11.2 g/dL — ABNORMAL LOW (ref 13.0–17.0)
Immature Granulocytes: 0 %
Lymphocytes Relative: 10 %
Lymphs Abs: 1 10*3/uL (ref 0.7–4.0)
MCH: 25.4 pg — ABNORMAL LOW (ref 26.0–34.0)
MCHC: 30.4 g/dL (ref 30.0–36.0)
MCV: 83.7 fL (ref 80.0–100.0)
Monocytes Absolute: 0.5 10*3/uL (ref 0.1–1.0)
Monocytes Relative: 5 %
Neutro Abs: 8.1 10*3/uL — ABNORMAL HIGH (ref 1.7–7.7)
Neutrophils Relative %: 83 %
Platelets: 294 10*3/uL (ref 150–400)
RBC: 4.41 MIL/uL (ref 4.22–5.81)
RDW: 14.7 % (ref 11.5–15.5)
WBC: 9.8 10*3/uL (ref 4.0–10.5)
nRBC: 0 % (ref 0.0–0.2)

## 2022-07-24 MED ORDER — OXYCODONE-ACETAMINOPHEN 5-325 MG PO TABS
2.0000 | ORAL_TABLET | Freq: Once | ORAL | Status: AC
  Administered 2022-07-24: 2 via ORAL
  Filled 2022-07-24: qty 2

## 2022-07-24 MED ORDER — LIDOCAINE-EPINEPHRINE-TETRACAINE (LET) TOPICAL GEL
3.0000 mL | Freq: Once | TOPICAL | Status: AC
  Administered 2022-07-24: 3 mL via TOPICAL
  Filled 2022-07-24: qty 3
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
## 2022-07-25 ENCOUNTER — Other Ambulatory Visit: Payer: Self-pay

## 2022-07-25 DIAGNOSIS — S02841A Fracture of lateral orbital wall, right side, initial encounter for closed fracture: Secondary | ICD-10-CM | POA: Diagnosis not present

## 2022-07-25 MED ORDER — CLINDAMYCIN HCL 150 MG PO CAPS
150.0000 mg | ORAL_CAPSULE | Freq: Four times a day (QID) | ORAL | 0 refills | Status: DC
  Filled 2022-07-25: qty 28, 7d supply, fill #0

## 2022-07-25 MED ORDER — CLINDAMYCIN HCL 150 MG PO CAPS
300.0000 mg | ORAL_CAPSULE | Freq: Once | ORAL | Status: AC
  Administered 2022-07-25: 300 mg via ORAL
  Filled 2022-07-25: qty 2

## 2022-07-25 NOTE — Discharge Instructions (Signed)
-   Acute minimally displaced fractures of the lateral and posterior walls of the right orbit. No evidence of intraorbital hematoma. - Acute mildly displaced fracture of the posterior wall of the right maxillary sinus. Probable blood products within the right maxillary sinus.
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
## 2022-07-26 ENCOUNTER — Other Ambulatory Visit: Payer: Self-pay

## 2022-07-26 ENCOUNTER — Telehealth: Payer: Self-pay | Admitting: Internal Medicine

## 2022-07-26 DIAGNOSIS — E1165 Type 2 diabetes mellitus with hyperglycemia: Secondary | ICD-10-CM

## 2022-07-26 MED ORDER — SEMAGLUTIDE(0.25 OR 0.5MG/DOS) 2 MG/3ML ~~LOC~~ SOPN
0.5000 mg | PEN_INJECTOR | SUBCUTANEOUS | 3 refills | Status: DC
  Filled 2022-07-26: qty 3, 28d supply, fill #0
  Filled 2022-09-19: qty 3, 28d supply, fill #1
  Filled 2022-11-22: qty 3, 28d supply, fill #2
  Filled 2023-02-06: qty 3, 28d supply, fill #3

## 2022-07-26 NOTE — Telephone Encounter (Signed)
MEDICATION: Ozempic .25MG   PHARMACY:  [facility] CONTACTED THEIR PHARMACY?  yes  IS THIS A 90 DAY SUPPLY : unknown  IS PATIENT OUT OF MEDICATION: YES  IF NOT; HOW MUCH IS LEFT:   LAST APPOINTMENT DATE: @7 /28/2023  NEXT APPOINTMENT DATE:@11 /22/2023  DO WE HAVE YOUR PERMISSION TO LEAVE A DETAILED MESSAGE?: yes

## 2022-07-26 NOTE — Telephone Encounter (Signed)
Rx sent to preferred pharmacy.

## 2022-07-27 ENCOUNTER — Other Ambulatory Visit: Payer: Self-pay

## 2022-07-27 MED ORDER — INSULIN GLARGINE SOLOSTAR 100 UNIT/ML ~~LOC~~ SOPN
PEN_INJECTOR | SUBCUTANEOUS | 6 refills | Status: DC
  Filled 2022-07-27 – 2022-11-07 (×2): qty 6, 42d supply, fill #0
  Filled 2022-12-15: qty 6, 42d supply, fill #1
  Filled 2023-01-25: qty 6, 42d supply, fill #2
  Filled 2023-03-06: qty 6, 42d supply, fill #3
  Filled 2023-04-14: qty 6, 42d supply, fill #4
  Filled 2023-05-25 – 2023-06-01 (×2): qty 6, 42d supply, fill #5
  Filled 2023-07-11: qty 6, 42d supply, fill #6

## 2022-07-28 ENCOUNTER — Other Ambulatory Visit: Payer: Self-pay

## 2022-08-01 ENCOUNTER — Inpatient Hospital Stay (HOSPITAL_COMMUNITY)
Admission: EM | Admit: 2022-08-01 | Discharge: 2022-08-03 | DRG: 682 | Disposition: A | Discharge status: Home Health | Payer: Medicare Other | Attending: Internal Medicine | Admitting: Internal Medicine

## 2022-08-01 ENCOUNTER — Observation Stay (HOSPITAL_COMMUNITY): Payer: Medicare Other

## 2022-08-01 ENCOUNTER — Other Ambulatory Visit: Payer: Self-pay

## 2022-08-01 ENCOUNTER — Emergency Department (HOSPITAL_COMMUNITY): Payer: Medicare Other

## 2022-08-01 ENCOUNTER — Encounter (HOSPITAL_COMMUNITY): Payer: Self-pay

## 2022-08-01 DIAGNOSIS — N179 Acute kidney failure, unspecified: Secondary | ICD-10-CM | POA: Diagnosis not present

## 2022-08-01 DIAGNOSIS — H547 Unspecified visual loss: Secondary | ICD-10-CM | POA: Diagnosis present

## 2022-08-01 DIAGNOSIS — E785 Hyperlipidemia, unspecified: Secondary | ICD-10-CM | POA: Diagnosis present

## 2022-08-01 DIAGNOSIS — W19XXXA Unspecified fall, initial encounter: Secondary | ICD-10-CM | POA: Diagnosis not present

## 2022-08-01 DIAGNOSIS — I428 Other cardiomyopathies: Secondary | ICD-10-CM | POA: Diagnosis present

## 2022-08-01 DIAGNOSIS — E1142 Type 2 diabetes mellitus with diabetic polyneuropathy: Secondary | ICD-10-CM

## 2022-08-01 DIAGNOSIS — I130 Hypertensive heart and chronic kidney disease with heart failure and stage 1 through stage 4 chronic kidney disease, or unspecified chronic kidney disease: Secondary | ICD-10-CM | POA: Diagnosis present

## 2022-08-01 DIAGNOSIS — R569 Unspecified convulsions: Secondary | ICD-10-CM

## 2022-08-01 DIAGNOSIS — Z794 Long term (current) use of insulin: Secondary | ICD-10-CM

## 2022-08-01 DIAGNOSIS — E1122 Type 2 diabetes mellitus with diabetic chronic kidney disease: Secondary | ICD-10-CM | POA: Diagnosis present

## 2022-08-01 DIAGNOSIS — E1151 Type 2 diabetes mellitus with diabetic peripheral angiopathy without gangrene: Secondary | ICD-10-CM | POA: Diagnosis present

## 2022-08-01 DIAGNOSIS — I2510 Atherosclerotic heart disease of native coronary artery without angina pectoris: Secondary | ICD-10-CM | POA: Diagnosis present

## 2022-08-01 DIAGNOSIS — N184 Chronic kidney disease, stage 4 (severe): Secondary | ICD-10-CM

## 2022-08-01 DIAGNOSIS — Z95810 Presence of automatic (implantable) cardiac defibrillator: Secondary | ICD-10-CM | POA: Diagnosis present

## 2022-08-01 DIAGNOSIS — N170 Acute kidney failure with tubular necrosis: Secondary | ICD-10-CM | POA: Diagnosis not present

## 2022-08-01 DIAGNOSIS — I252 Old myocardial infarction: Secondary | ICD-10-CM

## 2022-08-01 DIAGNOSIS — R296 Repeated falls: Secondary | ICD-10-CM | POA: Diagnosis present

## 2022-08-01 DIAGNOSIS — I5042 Chronic combined systolic (congestive) and diastolic (congestive) heart failure: Secondary | ICD-10-CM | POA: Diagnosis present

## 2022-08-01 DIAGNOSIS — Z8249 Family history of ischemic heart disease and other diseases of the circulatory system: Secondary | ICD-10-CM

## 2022-08-01 DIAGNOSIS — I10 Essential (primary) hypertension: Secondary | ICD-10-CM | POA: Diagnosis present

## 2022-08-01 DIAGNOSIS — G9341 Metabolic encephalopathy: Secondary | ICD-10-CM | POA: Diagnosis present

## 2022-08-01 DIAGNOSIS — Z8673 Personal history of transient ischemic attack (TIA), and cerebral infarction without residual deficits: Secondary | ICD-10-CM

## 2022-08-01 DIAGNOSIS — S98139A Complete traumatic amputation of one unspecified lesser toe, initial encounter: Secondary | ICD-10-CM | POA: Diagnosis present

## 2022-08-01 DIAGNOSIS — G40909 Epilepsy, unspecified, not intractable, without status epilepticus: Secondary | ICD-10-CM | POA: Diagnosis present

## 2022-08-01 DIAGNOSIS — E875 Hyperkalemia: Secondary | ICD-10-CM | POA: Diagnosis present

## 2022-08-01 DIAGNOSIS — Z79899 Other long term (current) drug therapy: Secondary | ICD-10-CM

## 2022-08-01 DIAGNOSIS — Z833 Family history of diabetes mellitus: Secondary | ICD-10-CM

## 2022-08-01 DIAGNOSIS — E860 Dehydration: Secondary | ICD-10-CM | POA: Diagnosis present

## 2022-08-01 DIAGNOSIS — E1140 Type 2 diabetes mellitus with diabetic neuropathy, unspecified: Secondary | ICD-10-CM | POA: Diagnosis present

## 2022-08-01 DIAGNOSIS — N189 Chronic kidney disease, unspecified: Secondary | ICD-10-CM | POA: Diagnosis present

## 2022-08-01 LAB — BASIC METABOLIC PANEL
Anion gap: 6 (ref 5–15)
BUN: 67 mg/dL — ABNORMAL HIGH (ref 6–20)
CO2: 19 mmol/L — ABNORMAL LOW (ref 22–32)
Calcium: 9.1 mg/dL (ref 8.9–10.3)
Chloride: 110 mmol/L (ref 98–111)
Creatinine, Ser: 4.16 mg/dL — ABNORMAL HIGH (ref 0.61–1.24)
GFR, Estimated: 16 mL/min — ABNORMAL LOW (ref >60)
Glucose, Bld: 178 mg/dL — ABNORMAL HIGH (ref 70–99)
Potassium: 5.9 mmol/L — ABNORMAL HIGH (ref 3.5–5.1)
Sodium: 135 mmol/L (ref 135–145)

## 2022-08-01 LAB — CBC WITH DIFFERENTIAL/PLATELET
Abs Immature Granulocytes: 0.04 10*3/uL (ref 0.00–0.07)
Basophils Absolute: 0 10*3/uL (ref 0.0–0.1)
Basophils Relative: 1 %
Eosinophils Absolute: 0.1 10*3/uL (ref 0.0–0.5)
Eosinophils Relative: 1 %
HCT: 34.4 % — ABNORMAL LOW (ref 39.0–52.0)
Hemoglobin: 10.6 g/dL — ABNORMAL LOW (ref 13.0–17.0)
Immature Granulocytes: 1 %
Lymphocytes Relative: 22 %
Lymphs Abs: 1.3 10*3/uL (ref 0.7–4.0)
MCH: 25.5 pg — ABNORMAL LOW (ref 26.0–34.0)
MCHC: 30.8 g/dL (ref 30.0–36.0)
MCV: 82.9 fL (ref 80.0–100.0)
Monocytes Absolute: 0.4 10*3/uL (ref 0.1–1.0)
Monocytes Relative: 6 %
Neutro Abs: 4.3 10*3/uL (ref 1.7–7.7)
Neutrophils Relative %: 69 %
Platelets: 264 10*3/uL (ref 150–400)
RBC: 4.15 MIL/uL — ABNORMAL LOW (ref 4.22–5.81)
RDW: 15 % (ref 11.5–15.5)
WBC: 6.2 10*3/uL (ref 4.0–10.5)
nRBC: 0 % (ref 0.0–0.2)

## 2022-08-01 LAB — RAPID URINE DRUG SCREEN, HOSP PERFORMED
Amphetamines: NOT DETECTED
Barbiturates: NOT DETECTED
Benzodiazepines: NOT DETECTED
Cocaine: NOT DETECTED
Opiates: NOT DETECTED
Tetrahydrocannabinol: NOT DETECTED

## 2022-08-01 LAB — URINALYSIS, ROUTINE W REFLEX MICROSCOPIC
Bilirubin Urine: NEGATIVE
Glucose, UA: NEGATIVE mg/dL
Hgb urine dipstick: NEGATIVE
Ketones, ur: NEGATIVE mg/dL
Leukocytes,Ua: NEGATIVE
Nitrite: NEGATIVE
Protein, ur: 100 mg/dL — AB
Specific Gravity, Urine: 1.01 (ref 1.005–1.030)
pH: 5 (ref 5.0–8.0)

## 2022-08-01 LAB — ETHANOL: Alcohol, Ethyl (B): <10 mg/dL (ref <10)

## 2022-08-01 LAB — CBG MONITORING, ED: Glucose-Capillary: 138 mg/dL — ABNORMAL HIGH (ref 70–99)

## 2022-08-01 MED ORDER — INSULIN GLARGINE-YFGN 100 UNIT/ML ~~LOC~~ SOLN
8.0000 [IU] | Freq: Every day | SUBCUTANEOUS | Status: DC
  Administered 2022-08-02 – 2022-08-03 (×2): 8 [IU] via SUBCUTANEOUS
  Filled 2022-08-01 (×3): qty 0.08

## 2022-08-01 MED ORDER — HYDROCODONE-ACETAMINOPHEN 5-325 MG PO TABS
1.0000 | ORAL_TABLET | Freq: Once | ORAL | Status: AC
  Administered 2022-08-01: 1 via ORAL
  Filled 2022-08-01: qty 1

## 2022-08-01 MED ORDER — SODIUM ZIRCONIUM CYCLOSILICATE 10 G PO PACK
10.0000 g | PACK | Freq: Every day | ORAL | Status: DC
  Administered 2022-08-01: 10 g via ORAL
  Filled 2022-08-01: qty 2

## 2022-08-01 MED ORDER — CARVEDILOL 12.5 MG PO TABS
12.5000 mg | ORAL_TABLET | Freq: Two times a day (BID) | ORAL | Status: DC
  Administered 2022-08-02 – 2022-08-03 (×3): 12.5 mg via ORAL
  Filled 2022-08-01 (×3): qty 1

## 2022-08-01 MED ORDER — INSULIN ASPART 100 UNIT/ML IJ SOLN
0.0000 [IU] | Freq: Three times a day (TID) | INTRAMUSCULAR | Status: DC
  Administered 2022-08-02: 1 [IU] via SUBCUTANEOUS
  Administered 2022-08-02: 2 [IU] via SUBCUTANEOUS
  Administered 2022-08-02 – 2022-08-03 (×3): 1 [IU] via SUBCUTANEOUS

## 2022-08-01 MED ORDER — ONDANSETRON HCL 4 MG/2ML IJ SOLN: 4.0000 mg | Freq: Four times a day (QID) | INTRAMUSCULAR | Status: DC | PRN

## 2022-08-01 MED ORDER — SODIUM CHLORIDE 0.9 % IV SOLN: INTRAVENOUS | Status: AC

## 2022-08-01 MED ORDER — HEPARIN SODIUM (PORCINE) 5000 UNIT/ML IJ SOLN
5000.0000 [IU] | Freq: Three times a day (TID) | INTRAMUSCULAR | Status: DC
Start: 2022-08-01 — End: 2022-08-03
  Administered 2022-08-01 – 2022-08-03 (×6): 5000 [IU] via SUBCUTANEOUS
  Filled 2022-08-01 (×6): qty 1

## 2022-08-01 MED ORDER — LIDOCAINE 5 % EX PTCH
1.0000 | MEDICATED_PATCH | CUTANEOUS | Status: DC
Start: 2022-08-01 — End: 2022-08-03
  Administered 2022-08-01 – 2022-08-02 (×2): 1 via TRANSDERMAL
  Filled 2022-08-01 (×2): qty 1

## 2022-08-01 MED ORDER — ALBUTEROL SULFATE (2.5 MG/3ML) 0.083% IN NEBU
10.0000 mg | INHALATION_SOLUTION | Freq: Once | RESPIRATORY_TRACT | Status: AC
  Administered 2022-08-01: 10 mg via RESPIRATORY_TRACT
  Filled 2022-08-01: qty 12

## 2022-08-01 MED ORDER — ACETAMINOPHEN 650 MG RE SUPP: 650.0000 mg | Freq: Four times a day (QID) | RECTAL | Status: DC | PRN

## 2022-08-01 MED ORDER — INSULIN ASPART 100 UNIT/ML IJ SOLN: 0.0000 [IU] | Freq: Every day | INTRAMUSCULAR | Status: DC

## 2022-08-01 MED ORDER — DEXTROSE 50 % IV SOLN
1.0000 | Freq: Once | INTRAVENOUS | Status: AC
  Administered 2022-08-01: 50 mL via INTRAVENOUS
  Filled 2022-08-01: qty 50

## 2022-08-01 MED ORDER — ISOSORBIDE MONONITRATE ER 60 MG PO TB24
30.0000 mg | ORAL_TABLET | Freq: Every day | ORAL | Status: DC
  Administered 2022-08-02 – 2022-08-03 (×2): 30 mg via ORAL
  Filled 2022-08-01 (×2): qty 1

## 2022-08-01 MED ORDER — TAMSULOSIN HCL 0.4 MG PO CAPS
0.4000 mg | ORAL_CAPSULE | Freq: Every day | ORAL | Status: DC
  Administered 2022-08-02: 0.4 mg via ORAL
  Filled 2022-08-01: qty 1

## 2022-08-01 MED ORDER — SODIUM CHLORIDE 0.9 % IV BOLUS
1000.0000 mL | Freq: Once | INTRAVENOUS | Status: AC
  Administered 2022-08-01: 1000 mL via INTRAVENOUS

## 2022-08-01 MED ORDER — INSULIN ASPART 100 UNIT/ML IV SOLN
5.0000 [IU] | Freq: Once | INTRAVENOUS | Status: AC
  Administered 2022-08-01: 5 [IU] via INTRAVENOUS

## 2022-08-01 MED ORDER — ONDANSETRON HCL 4 MG PO TABS: 4.0000 mg | ORAL_TABLET | Freq: Four times a day (QID) | ORAL | Status: DC | PRN

## 2022-08-01 MED ORDER — POLYETHYLENE GLYCOL 3350 17 G PO PACK: 17.0000 g | PACK | Freq: Every day | ORAL | Status: DC | PRN

## 2022-08-01 MED ORDER — ACETAMINOPHEN 325 MG PO TABS: 650.0000 mg | ORAL_TABLET | Freq: Four times a day (QID) | ORAL | Status: DC | PRN

## 2022-08-01 MED ORDER — ORAL CARE MOUTH RINSE: 15.0000 mL | OROMUCOSAL | Status: DC | PRN
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
## 2022-08-01 NOTE — Assessment & Plan Note (Signed)
Frequent falls.  Last fall this morning in the bathroom, head CT negative for acute abnormality.  Reporting back pain, thoracic and lumbar x-rays are negative for fractures.  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  He ambulates with a cane.
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
## 2022-08-01 NOTE — ED Notes (Signed)
Report called  

## 2022-08-01 NOTE — Assessment & Plan Note (Addendum)
Systolic 800S to 471. - Hold Lasix for now - resume hydralazine at lower dose -Resume Imdur, carvedilol, tamsulosin

## 2022-08-01 NOTE — Assessment & Plan Note (Signed)
Controlled.  A1c 6.8.  Blood glucose 178 - SSI- S -Resume home Lantus at reduced dose 8 units daily (home dose 12 units)

## 2022-08-01 NOTE — Assessment & Plan Note (Signed)
Family reports last seizure was about a year ago at least.  On gabapentin, otherwise not on medication. Resume gabapentin at d/c

## 2022-08-01 NOTE — H&P (Signed)
History and Physical    ADEKUNLE ROHRBACH LNL:[id number] DOB: Aug 20, 1966 DOA: 08/01/2022  PCP: Willene Hatchet, NP   Patient coming from: Home  I have personally briefly reviewed patient's old medical records in [place]  Chief Complaint: Fall  HPI: Jeffrey [name] is a 56 y.o. male with medical history significant for chronic systolic congestive heart failure, AICD, diabetes mellitus, kidney stones, hypertension, coronary artery. Patient was brought to the ED reports of a fall this morning.  At the time of my evaluation, patient appears a bit lethargic, but able to answer questions appropriately.  Mother and girlfriend at bedside.  Patient has been staying with his mom over the past week, prior to this, he stayed his girlfriend.  Mother reports over the past week he has fallen about 4 times.  His last fall was this morning in the bathroom.  He has a history of seizures but did doubt he had a seizure.  When he fell his mother heard him and immediately went to see him.  He was awake, did not lose consciousness.  At baseline he has a tendency to fall, as he has vision problems and gait problems. Reports difficulty breathing over the past week that is intermittent, and cough.  No fevers no chills.  He was started on Ozempic about a month ago, and since then his appetite has reduced.  No vomiting no loose stools.  He has also been taking Lasix 40 mg twice every day.  For the past few days, he has been sleeping more.  And has just not been himself.  Today he has been reporting back pain after the fall, otherwise denies any other pain.  Does not drink alcohol or use drugs.  ED Course: Temperature 98.1.  Heart rate 73-79.  Respiratory rate 16-22.  Blood pressure systolic 408-144.  O2 sats greater than 96% on room. Creatinine elevated at 4.16.  Serum bicarb 19.  Potassium 5.9.  Sodium 135.  Head CT without acute abnormality, thoracic and lumbar x-ray unremarkable. 1 L bolus given. UA not  suggestive of UTI Lok[place]ma, 5 units of NovoLog, D50, and albuterol nebs given for hyperkalemia. Hospitalist to admit for AKI on CKD.  Review of Systems: As per HPI all other systems reviewed and negative.  Past Medical History:  Diagnosis Date   AICD (automatic cardioverter/defibrillator) present    [employer]   AICD (automatic cardioverter/defibrillator) present    MDT [facility] AF MRI   Anemia    CAD (coronary artery disease)    a. cath 01/31/17: 60% 1st RPLB, 60% dist RCA, 55% prox RCA, 10% pro LAD --> Rx TX.    CHF (congestive heart failure) (HCC)    Chronic systolic CHF (congestive heart failure) ([place]) 01/28/2017   1. Echo 01/29/17:  EF 20-25, normal wall motion, mild LAE // 2. EF 10-15 by [facility] 01/2017    Diabetes mellitus without complication ([place])    type 2   Diabetic foot infection ([place]) 03/2016   RT FOOT   Dyspnea    History of kidney stones    passed   HTN (hypertension)    Hyperlipidemia    Hypertension    Myocardial infarction [facility]), although reported, NICM at cath    NICM (nonischemic cardiomyopathy) ([place]) 02/15/2017   1. Mod non-obs CAD on LHC in 01/2017 - CAD does not explain cardiomyopathy   Renal disorder    stage 4    Past Surgical History:  Procedure Laterality Date   AIR/FLUID EXCHANGE Right 07/14/2020  Procedure: AIR/FLUID EXCHANGE;  Surgeon: Jalene Mullet, MD;  Location: [place];  Service: Ophthalmology;  Laterality: Right;   AMPUTATION Right 04/01/2016   Procedure: Right Great Toe Amputation;  Surgeon: Newt Minion, MD;  Location: [place];  Service: Orthopedics;  Laterality: Right;   AMPUTATION Right 06/19/2016   Procedure: AMPUTATION SECOND TOE;  Surgeon: Marybelle Killings, MD;  Location: [place];  Service: Orthopedics;  Laterality: Right;   BACK SURGERY     for abscess   CARDIAC CATHETERIZATION     ICD IMPLANT N/A 01/15/2018   Procedure: ICD IMPLANT;  Surgeon: Deboraha Sprang, MD;  Location: [place] CV [facility];  Service: Cardiovascular;  Laterality: N/A;   INJECTION OF  SILICONE OIL Right 5/0/7225   Procedure: INJECTION OF SILICONE OIL;  Surgeon: Jalene Mullet, MD;  Location: [place];  Service: Ophthalmology;  Laterality: Right;   INJECTION OF SILICONE OIL Right 7/50/5183   Procedure: INJECTION OF SILICONE OIL;  Surgeon: Jalene Mullet, MD;  Location: [place];  Service: Ophthalmology;  Laterality: Right;   INJECTION OF SILICONE OIL Left 3/58/2518   Procedure: INJECTION OF SILICONE OIL;  Surgeon: Jalene Mullet, MD;  Location: [place];  Service: Ophthalmology;  Laterality: Left;   INSERT / REPLACE / REMOVE PACEMAKER     MEMBRANE PEEL Right 08/25/2020   Procedure: MEMBRANE PEEL;  Surgeon: Jalene Mullet, MD;  Location: [place];  Service: Ophthalmology;  Laterality: Right;   MEMBRANE PEEL Left 07/05/2022   Procedure: MEMBRANECTOMY;  Surgeon: Jalene Mullet, MD;  Location: [place];  Service: Ophthalmology;  Laterality: Left;   PARS PLANA VITRECTOMY Right 07/14/2020   Procedure: PARS PLANA VITRECTOMY WITH 25 GAUGE, Membranetomy, drainage of subretinal fluid;  Surgeon: Jalene Mullet, MD;  Location: [place];  Service: Ophthalmology;  Laterality: Right;   PARS PLANA VITRECTOMY Right 08/25/2020   Procedure: PARS PLANA VITRECTOMY WITH 25 GAUGE;  Surgeon: Jalene Mullet, MD;  Location: [place];  Service: Ophthalmology;  Laterality: Right;   PARS PLANA VITRECTOMY Left 07/05/2022   Procedure: LEFT PARS PLANA VITRECTOMY WITH 25 GAUGE;  Surgeon: Jalene Mullet, MD;  Location: [place];  Service: Ophthalmology;  Laterality: Left;   PHOTOCOAGULATION WITH LASER Right 07/14/2020   Procedure: PHOTOCOAGULATION WITH LASER;  Surgeon: Jalene Mullet, MD;  Location: [place];  Service: Ophthalmology;  Laterality: Right;   PHOTOCOAGULATION WITH LASER Right 08/25/2020   Procedure: PHOTOCOAGULATION WITH LASER;  Surgeon: Jalene Mullet, MD;  Location: [place];  Service: Ophthalmology;  Laterality: Right;   PHOTOCOAGULATION WITH LASER Left 07/05/2022   Procedure: PHOTOCOAGULATION WITH LASER;  Surgeon: Jalene Mullet, MD;  Location: [place];  Service: Ophthalmology;  Laterality: Left;   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 He has never used smokeless tobacco. He reports that he does not drink alcohol and does not use drugs.  No Known Allergies  Family History  Problem Relation Age of Onset   Diabetes Mother  Hypertension Mother    Diabetes Father    Heart attack Father    Diabetes Sister    Heart attack Maternal Grandmother    Prior to Admission medications   Medication Sig Start Date End Date Taking? Authorizing Provider  albuterol (VENTOLIN HFA) 108 (90 Base) MCG/ACT inhaler Inhale 1 to 2 puffs by mouth into the lungs every 6 hours as needed for wheezing or shortness of breath. 10/07/21  Yes Freeman Caldron M, PA-C  carvedilol (COREG) 12.5 MG tablet Take 12.5 mg by mouth 2 (two) times daily with a meal. 07/11/22  Yes [provider]  cetirizine (ZYRTEC) 10 MG tablet TAKE 1 TABLET (10 MG TOTAL) BY MOUTH DAILY. 07/27/20  Yes Charlott Rakes, MD  Cholecalciferol (D3-50) 1.25 MG (50000 UT) capsule Take 1 capsule by mouth once weekly 03/07/22  Yes Claudia Desanctis, MD  clindamycin (CLEOCIN) 150 MG capsule Take 1 capsule (150 mg total) by mouth every 6 (six) hours. 07/25/22  Yes Mesner, Corene Cornea, MD  furosemide (LASIX) 40 MG tablet take 1 tablet by mouth twice a day 07/11/22  Yes   gabapentin (NEURONTIN) 300  MG capsule TAKE 2 CAPSULES BY MOUTH AT BEDTIME 04/27/22  Yes   hydrALAZINE (APRESOLINE) 50 MG tablet take 1 tablet by mouth three times a day for hypertension Patient taking differently: Take 100 mg by mouth 3 (three) times daily. 07/11/22  Yes   Insulin Glargine Solostar (LANTUS) 100 UNIT/ML Solostar Pen inject 12-14 units subcutaneously in the morning Patient taking differently: Inject 12-15 Units into the skin in the morning. 07/27/22  Yes   isosorbide mononitrate (IMDUR) 30 MG 24 hr tablet Take 30 mg by mouth daily. 07/11/22  Yes [provider]  lisinopril (ZESTRIL) 10 MG tablet take 1 tablet by mouth daily 07/11/22  Yes   ondansetron (ZOFRAN) 4 MG tablet Take 1 tablet (4 mg total) by mouth every 6 (six) hours as needed for nausea or vomiting. 70/96/28  Yes Delora Fuel, MD  potassium chloride SA (KLOR-CON M) 20 MEQ tablet TAKE 2 TABLETS BY MOUTH DAILY 04/27/22  Yes   prednisoLONE acetate (PRED FORTE) 1 % ophthalmic suspension INSTILL 1 DROP INTO THE LEFT EYE FOUR TIMES A DAY x 7 days, THEN THREE TIMES A DAY x 7 days, THEN TWICE A DAY x 7 days, THEN DAILY X 1 WEEK Patient taking differently: Place 1 drop into the left eye 2 (two) times daily. 07/06/22  Yes Jalene Mullet, MD  Semaglutide,0.25 or 0.5MG/DOS, 2 MG/3ML SOPN Inject 0.5 mg into the skin once a week. 07/26/22  Yes [name], MD  tamsulosin (FLOMAX) 0.4 MG CAPS capsule TAKE 1 CAPSULE BY MOUTH AT BEDTIME (STOP IF DIZZINESS OCCURS) 04/27/22  Yes   Accu-Chek Softclix Lancets lancets Use to check blood sugar three times daily E11.65 08/12/21   Charlott Rakes, MD  Blood Glucose Monitoring Suppl (ACCU-CHEK GUIDE) w/Device KIT Use to check blood sugar three times daily E11.65 08/12/21   Charlott Rakes, MD  Continuous Blood Gluc Receiver (FREESTYLE LIBRE 2 READER) DEVI Use as directed four to five times daily. 03/29/22     Continuous Blood Gluc Sensor (FREESTYLE LIBRE 2 SENSOR) MISC Use as directed, to montior blood sugar 4 to 5 times a  day daily. 03/29/22     Elastic Bandages & Supports (T.E.D. KNEE LENGTH/S-LONG) MISC 1 (one) each daily, apply compression stockings daily to help decrease leg swelling 11/11/21     gabapentin (NEURONTIN) 300 MG capsule take 2 capsules by mouth daily at bedtime  07/11/22     glucose blood (ACCU-CHEK GUIDE) test strip Use to check blood sugar three times daily E11.65 08/12/21   Charlott Rakes, MD  Insulin Glargine Solostar (LANTUS) 100 UNIT/ML Solostar Pen Inject 12-14 Units into the skin daily. Patient not taking: Reported on 08/01/2022 07/08/22   [name], MD  Insulin Pen Needle (BD PEN NEEDLE NANO U/F) 32G X 4 MM MISC USE TO INJECT LANTUS DAILY. MUST USE NEW PEN NEEDLE WITH EACH INJECTION. 04/18/22   [name], MD  potassium chloride SA (KLOR-CON M) 20 MEQ tablet TAKE 2 TABLETS BY MOUTH DAILY 07/11/22     tamsulosin (FLOMAX) 0.4 MG CAPS capsule TAKE 1 CAPSULE BY MOUTH DAILY AT BEDTIME ( STOP IF DIZZINESS OCCURS) Patient not taking: Reported on 08/01/2022 07/11/22     amLODipine (NORVASC) 10 MG tablet Take 1 tablet (10 mg total) by mouth daily. 03/17/22 04/27/22  Thurnell Lose, MD    Physical Exam: Vitals:   08/01/22 1630 08/01/22 1645 08/01/22 1730 08/01/22 1830  BP: 112/74  132/74 138/88  Pulse: 73 73  74  Resp: 18 (!) 22 16 (!) 21  Temp:      TempSrc:      SpO2: 97% 96%  100%  Weight:      Height:        Constitutional: Mildly lethargic, but arouses to voice Vitals:   08/01/22 1630 08/01/22 1645 08/01/22 1730 08/01/22 1830  BP: 112/74  132/74 138/88  Pulse: 73 73  74  Resp: 18 (!) 22 16 (!) 21  Temp:      TempSrc:      SpO2: 97% 96%  100%  Weight:      Height:       Eyes: PERRL, lids and conjunctivae normal ENMT: Mucous membranes are moist.  Respiratory: clear to auscultation bilaterally, no wheezing, no crackles. Normal respiratory effort. No accessory muscle use.  Cardiovascular: Regular rate and rhythm, no murmurs / rubs / gallops. No extremity edema.  Lower  extremities warm Abdomen: no tenderness, no masses palpated. No hepatosplenomegaly. Bowel sounds positive.  Musculoskeletal: no clubbing / cyanosis.  Has had toes amputated from the right lower extremity. Skin: no rashes, lesions, ulcers. No induration Neurologic: 4+/5 strength in all extremities, no facial asymmetry, speech is fluent.  Psychiatric: Mildly lethargic, but answering questions appropriately.  Labs on Admission: I have personally reviewed following labs and imaging studies  CBC: Recent Labs  Lab 08/01/22 1615  WBC 6.2  NEUTROABS 4.3  HGB 10.6*  HCT 34.4*  MCV 82.9  PLT 631   Basic Metabolic Panel: Recent Labs  Lab 08/01/22 1615  NA 135  K 5.9*  CL 110  CO2 19*  GLUCOSE 178*  BUN 67*  CREATININE 4.16*  CALCIUM 9.1   Urine analysis:    Component Value Date/Time   COLORURINE YELLOW 08/01/2022 1809   APPEARANCEUR CLEAR 08/01/2022 1809   LABSPEC 1.010 08/01/2022 1809   PHURINE 5.0 08/01/2022 1809   GLUCOSEU NEGATIVE 08/01/2022 1809   HGBUR NEGATIVE 08/01/2022 1809   BILIRUBINUR NEGATIVE 08/01/2022 1809   BILIRUBINUR neg 03/30/2016 1116   KETONESUR NEGATIVE 08/01/2022 1809   PROTEINUR 100 (A) 08/01/2022 1809   UROBILINOGEN 0.2 03/30/2016 1116   UROBILINOGEN 1.0 03/10/2014 2230   NITRITE NEGATIVE 08/01/2022 1809   LEUKOCYTESUR NEGATIVE 08/01/2022 1809    Radiological Exams on Admission: DG Thoracic Spine 2 View  Result Date: 08/01/2022 CLINICAL DATA:  Pain after fall. EXAM: THORACIC SPINE 2 VIEWS; LUMBAR SPINE - COMPLETE 4+  VIEW COMPARISON:  None Available. FINDINGS: Thoracic spine: There is no evidence of thoracic spine fracture. Alignment is normal. No other significant bone abnormalities are identified. Lumbar spine: No acute fracture or subluxation. Multilevel degenerative disease with disc height loss and prominent anterior osteophytes. Multilevel facet joint arthropathy. IMPRESSION: 1. No acute fracture of the thoracic or lumbar spine. Vertebral body  heights are maintained. 2. 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 Electronically Signed   By: Keane Police D.O.   On: 08/01/2022 17:39   DG Lumbar Spine Complete  Result Date: 08/01/2022 CLINICAL DATA:  Pain after fall. EXAM: THORACIC SPINE 2 VIEWS; LUMBAR SPINE - COMPLETE 4+ VIEW COMPARISON:  None Available. FINDINGS: Thoracic spine: There is no evidence of thoracic spine fracture. Alignment is normal. No other significant bone abnormalities are identified. Lumbar spine: No acute fracture or subluxation. Multilevel degenerative disease with disc height loss and prominent anterior osteophytes. Multilevel facet joint arthropathy. IMPRESSION: 1. No acute fracture of the thoracic or lumbar spine. Vertebral body heights are maintained. 2. 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 Electronically Signed   By: Keane Police D.O.   On: 08/01/2022 17:39   CT Head Wo Contrast  Result Date: 08/01/2022 CLINICAL DATA:  Mental status change, unknown cause; Facial trauma, blunt EXAM: CT HEAD WITHOUT CONTRAST CT MAXILLOFACIAL WITHOUT CONTRAST TECHNIQUE: Multidetector CT imaging of the head and maxillofacial structures were performed using the standard protocol without intravenous contrast. Multiplanar CT image reconstructions of the maxillofacial structures were also generated. RADIATION DOSE REDUCTION: This exam was performed according to the departmental dose-optimization program which includes automated exposure control, adjustment of the mA and/or kV according to patient size and/or use of iterative reconstruction technique. COMPARISON:  CT head and face 07/24/2022 FINDINGS: CT HEAD FINDINGS Brain: No acute intracranial hemorrhage, mass effect, or edema. No new loss of gray-white differentiation.  Stable findings of probable chronic microvascular ischemic changes with superimposed chronic small vessel infarcts in the cerebral white matter. Ventricles are stable in size. No extra-axial collection. Vascular: No new finding. Skull: Unremarkable. Other: Mastoid air cells are clear. CT MAXILLOFACIAL FINDINGS Osseous: Redemonstrated facial fractures.  No new finding. Orbits: Increased density within bilateral globes reflecting retinal detachment therapy. No new abnormality. Sinuses: Mild mucosal thickening. Right maxillary sinus retention cyst or polyp. Soft tissues: No new finding.  Decreased soft tissue swelling. IMPRESSION: No acute intracranial abnormality. Decreased soft tissue swelling. Fractures as seen previously. Electronically Signed   By: Macy Mis M.D.   On: 08/01/2022 16:30   CT Maxillofacial Wo Contrast  Result Date: 08/01/2022 CLINICAL DATA:  Mental status change, unknown cause; Facial trauma, blunt EXAM: CT HEAD WITHOUT CONTRAST CT MAXILLOFACIAL WITHOUT CONTRAST TECHNIQUE: Multidetector CT imaging of the head and maxillofacial structures were performed using the standard protocol without intravenous contrast. Multiplanar CT image reconstructions of the maxillofacial structures were also generated. RADIATION DOSE REDUCTION: This exam was performed according to the departmental dose-optimization program which includes automated exposure control, adjustment of the mA and/or kV according to patient size and/or use of iterative reconstruction technique. COMPARISON:  CT head and face 07/24/2022 FINDINGS: CT HEAD FINDINGS Brain: No acute intracranial hemorrhage, mass effect, or edema. No new loss of gray-white differentiation. Stable findings of probable chronic microvascular ischemic changes with superimposed chronic small vessel infarcts in the cerebral white matter. Ventricles are  stable in size. No extra-axial collection. Vascular: No new finding. Skull: Unremarkable. Other: Mastoid air cells  are clear. CT MAXILLOFACIAL FINDINGS Osseous: Redemonstrated facial fractures.  No new finding. Orbits: Increased density within bilateral globes reflecting retinal detachment therapy. No new abnormality. Sinuses: Mild mucosal thickening. Right maxillary sinus retention cyst or polyp. Soft tissues: No new finding.  Decreased soft tissue swelling. IMPRESSION: No acute intracranial abnormality. Decreased soft tissue swelling. Fractures as seen previously. Electronically Signed   By: Macy Mis M.D.   On: 08/01/2022 16:30    EKG: Independently reviewed.  Sinus rhythm rate 77, QTc 420.  Artifacts present.  No significant ST or T wave abnormalities, similar to prior EKG.  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  Creatinine elevated at 4.16, baseline 2.6-3.2.  Last check 8 days ago, creatinine was 2.69.  BUN of 67.  Likely prerenal, from poor recent oral intake, and on Lasix 40 mg twice daily. -1 L bolus given, continue n/s 75cc/hr x 20hrs -Hold home Lasix, also lisinopril 10 mg  Falls Frequent falls.  Last fall this morning in the bathroom, head CT negative for acute abnormality.  Reporting back pain, thoracic and lumbar x-rays are negative for fractures.  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  He ambulates with a cane.  Acute metabolic encephalopathy Appears lethargic, family reports increased sleepiness over the past few days.  May be due to AKI, with BUN elevated at 67.  Head  CT without acute abnormality.  UA not suggestive of UTI.  History of seizure, last seizure over a year ago.  Reports cough, intermittent difficulty breathing.  UDS clean. -Obtain chest x-ray -Hydrate -Hold gabapentin  Chronic combined systolic (congestive) and diastolic (congestive) heart failure (HCC) Stable and compensated.  Admitted for AKI.  Last echo 03/2022, EF of 50 to 55%, with grade 1 DD. -Hold Lasix for now -He is on hydralazine 100 mg 3 times daily, hold for now, resume in a.m. pending stable blood pressure -Resume Imdur, carvedilol  Diabetic neuropathy (HCC) Controlled.  A1c 6.8.  Blood glucose 178 - SSI- S -Resume home Lantus at reduced dose 8 units daily (home dose 12 units)  Seizure ([place]) Family reports last seizure was about a year ago at least.  On gabapentin, otherwise not on medication.  Essential hypertension Systolic 342A to 768. - Hold Lasix for now - On hydralazine 100 mg 3 times daily, hold for now, resume in a.m. pending stable blood pressure -Resume Imdur, carvedilol, tamsulosin    DVT prophylaxis: Heparin Code Status: Full Family Communication: Girlfriend-Barbara and mother at bedside Disposition Plan: ~ 2 days Consults called:  None Admission status:  Obs tele   Author: Bethena Roys, MD 08/01/2022 8:21 PM  For on call review [email or web address].

## 2022-08-01 NOTE — Assessment & Plan Note (Addendum)
Due to acute on chronic renal failure, dehydration Head CT without acute abnormality.  UA not suggestive of UTI.  History of seizure, last seizure over a year ago.  UDS clean. -Obtain chest x-ray--no acute findings -Hydrate -08/03/22--mother at bedside states pt is near baseline

## 2022-08-01 NOTE — ED Notes (Signed)
Patient transported to room from CT 

## 2022-08-01 NOTE — Assessment & Plan Note (Addendum)
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  Creatinine elevated at 4.16, baseline 2.6-3.2.  Last check 8 days ago, creatinine was 2.69.  BUN of 67.  Likely prerenal, from poor recent oral intake, and on Lasix 40 mg twice daily. -1 L bolus given, continue n/s 75cc/hr x 20hrs -Hold home Lasix, also lisinopril 10 mg

## 2022-08-01 NOTE — Assessment & Plan Note (Addendum)
Stable and compensated.  Admitted for AKI.  Last echo 03/2022, EF of 50 to 55%, with grade 1 DD. -Hold Lasix for now -Resume Imdur, carvedilol Clinically euvolemic

## 2022-08-02 DIAGNOSIS — I5042 Chronic combined systolic (congestive) and diastolic (congestive) heart failure: Secondary | ICD-10-CM | POA: Diagnosis present

## 2022-08-02 DIAGNOSIS — N179 Acute kidney failure, unspecified: Secondary | ICD-10-CM | POA: Diagnosis present

## 2022-08-02 DIAGNOSIS — I428 Other cardiomyopathies: Secondary | ICD-10-CM | POA: Diagnosis present

## 2022-08-02 DIAGNOSIS — R296 Repeated falls: Secondary | ICD-10-CM | POA: Diagnosis present

## 2022-08-02 DIAGNOSIS — Z8673 Personal history of transient ischemic attack (TIA), and cerebral infarction without residual deficits: Secondary | ICD-10-CM | POA: Diagnosis not present

## 2022-08-02 DIAGNOSIS — I10 Essential (primary) hypertension: Secondary | ICD-10-CM | POA: Diagnosis not present

## 2022-08-02 DIAGNOSIS — H547 Unspecified visual loss: Secondary | ICD-10-CM | POA: Diagnosis present

## 2022-08-02 DIAGNOSIS — I130 Hypertensive heart and chronic kidney disease with heart failure and stage 1 through stage 4 chronic kidney disease, or unspecified chronic kidney disease: Secondary | ICD-10-CM | POA: Diagnosis present

## 2022-08-02 DIAGNOSIS — E875 Hyperkalemia: Secondary | ICD-10-CM | POA: Diagnosis present

## 2022-08-02 DIAGNOSIS — I252 Old myocardial infarction: Secondary | ICD-10-CM | POA: Diagnosis not present

## 2022-08-02 DIAGNOSIS — G40909 Epilepsy, unspecified, not intractable, without status epilepticus: Secondary | ICD-10-CM | POA: Diagnosis present

## 2022-08-02 DIAGNOSIS — E860 Dehydration: Secondary | ICD-10-CM | POA: Diagnosis present

## 2022-08-02 DIAGNOSIS — N184 Chronic kidney disease, stage 4 (severe): Secondary | ICD-10-CM | POA: Diagnosis present

## 2022-08-02 DIAGNOSIS — N170 Acute kidney failure with tubular necrosis: Secondary | ICD-10-CM | POA: Diagnosis present

## 2022-08-02 DIAGNOSIS — Z95810 Presence of automatic (implantable) cardiac defibrillator: Secondary | ICD-10-CM | POA: Diagnosis not present

## 2022-08-02 DIAGNOSIS — Z79899 Other long term (current) drug therapy: Secondary | ICD-10-CM | POA: Diagnosis not present

## 2022-08-02 DIAGNOSIS — Z794 Long term (current) use of insulin: Secondary | ICD-10-CM | POA: Diagnosis not present

## 2022-08-02 DIAGNOSIS — I2510 Atherosclerotic heart disease of native coronary artery without angina pectoris: Secondary | ICD-10-CM | POA: Diagnosis present

## 2022-08-02 DIAGNOSIS — Z833 Family history of diabetes mellitus: Secondary | ICD-10-CM | POA: Diagnosis not present

## 2022-08-02 DIAGNOSIS — E1140 Type 2 diabetes mellitus with diabetic neuropathy, unspecified: Secondary | ICD-10-CM | POA: Diagnosis present

## 2022-08-02 DIAGNOSIS — E785 Hyperlipidemia, unspecified: Secondary | ICD-10-CM | POA: Diagnosis present

## 2022-08-02 DIAGNOSIS — E1151 Type 2 diabetes mellitus with diabetic peripheral angiopathy without gangrene: Secondary | ICD-10-CM | POA: Diagnosis present

## 2022-08-02 DIAGNOSIS — E1122 Type 2 diabetes mellitus with diabetic chronic kidney disease: Secondary | ICD-10-CM | POA: Diagnosis present

## 2022-08-02 DIAGNOSIS — G9341 Metabolic encephalopathy: Secondary | ICD-10-CM | POA: Diagnosis present

## 2022-08-02 DIAGNOSIS — N189 Chronic kidney disease, unspecified: Secondary | ICD-10-CM | POA: Diagnosis not present

## 2022-08-02 DIAGNOSIS — Z8249 Family history of ischemic heart disease and other diseases of the circulatory system: Secondary | ICD-10-CM | POA: Diagnosis not present

## 2022-08-02 DIAGNOSIS — W19XXXD Unspecified fall, subsequent encounter: Secondary | ICD-10-CM | POA: Diagnosis not present

## 2022-08-02 LAB — GLUCOSE, CAPILLARY
Glucose-Capillary: 143 mg/dL — ABNORMAL HIGH (ref 70–99)
Glucose-Capillary: 164 mg/dL — ABNORMAL HIGH (ref 70–99)
Glucose-Capillary: 126 mg/dL — ABNORMAL HIGH (ref 70–99)
Glucose-Capillary: 146 mg/dL — ABNORMAL HIGH (ref 70–99)

## 2022-08-02 LAB — CBC
HCT: 31 % — ABNORMAL LOW (ref 39.0–52.0)
Hemoglobin: 9.5 g/dL — ABNORMAL LOW (ref 13.0–17.0)
MCH: 25.7 pg — ABNORMAL LOW (ref 26.0–34.0)
MCHC: 30.6 g/dL (ref 30.0–36.0)
MCV: 83.8 fL (ref 80.0–100.0)
Platelets: 245 10*3/uL (ref 150–400)
RBC: 3.7 MIL/uL — ABNORMAL LOW (ref 4.22–5.81)
RDW: 14.7 % (ref 11.5–15.5)
WBC: 5.1 10*3/uL (ref 4.0–10.5)
nRBC: 0 % (ref 0.0–0.2)

## 2022-08-02 LAB — BASIC METABOLIC PANEL
Anion gap: 3 — ABNORMAL LOW (ref 5–15)
BUN: 60 mg/dL — ABNORMAL HIGH (ref 6–20)
CO2: 19 mmol/L — ABNORMAL LOW (ref 22–32)
Calcium: 8.6 mg/dL — ABNORMAL LOW (ref 8.9–10.3)
Chloride: 115 mmol/L — ABNORMAL HIGH (ref 98–111)
Creatinine, Ser: 3.28 mg/dL — ABNORMAL HIGH (ref 0.61–1.24)
GFR, Estimated: 21 mL/min — ABNORMAL LOW (ref >60)
Glucose, Bld: 118 mg/dL — ABNORMAL HIGH (ref 70–99)
Potassium: 5 mmol/L (ref 3.5–5.1)
Sodium: 137 mmol/L (ref 135–145)

## 2022-08-02 LAB — URINE CULTURE: Culture: NO GROWTH

## 2022-08-02 LAB — HIV ANTIBODY (ROUTINE TESTING W REFLEX): HIV Screen 4th Generation wRfx: NONREACTIVE

## 2022-08-02 NOTE — Hospital Course (Addendum)
Jeffrey [name] is a 56 y.o. male with medical history significant for chronic sCHF,, AICD, DMII, kidney stones, HTN, CAD, seizures Patient was brought to the ED reports of a fall this morning.   At the time of evaluation, patient appears a bit lethargic, but able to answer questions appropriately.  Mother and girlfriend at bedside.  Patient has been staying with his mom over the past week, prior to this, he stayed his girlfriend.  Mother reports over the past week he has fallen about 4 times.  His last fall was this morning in the bathroom.  He has a history of seizures but did doubt he had a seizure.  When he fell his mother heard him and immediately went to see him.  He was awake, did not lose consciousness.  At baseline he has a tendency to fall, as he has vision problems and gait problems. Reports difficulty breathing over the past week that is intermittent, and cough.  No fevers no chills.  He was started on Ozempic about a month ago, and since then his appetite has reduced.  No vomiting no loose stools.  He has also been taking Lasix 40 mg twice every day.  For the past few days, he has been sleeping more.  And has just not been himself.  Today he has been reporting back pain after the fall, otherwise denies any other pain.  Does not drink alcohol or use drugs.   ED Course:  Temperature 98.1.  Heart rate 73-79.  Respiratory rate 16-22.  Blood pressure systolic 947-654.  O2 sats greater than 96% on room. Creatinine elevated at 4.16.  Serum bicarb 19.  Potassium 5.9.  Sodium 135.  Head CT without acute abnormality, thoracic and lumbar x-ray unremarkable. 1 L bolus given. UA not suggestive of UTI [place], 5 units of NovoLog, D50, and albuterol nebs given for hyperkalemia. Hospitalist to admit for AKI on CKD.   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  Creatinine elevated at 4.16, baseline 2.6-3.2.   Last check 8 days ago, creatinine was 2.69.  BUN of 67.   Likely prerenal, from poor recent oral intake, and on Lasix 40 mg twice daily. -1 L bolus given, continue IV fluid maintenance  -Hold home Lasix, also lisinopril 10 mg   Falls -Frequent falls, - Pt/Ot  - Last fall this morning in the bathroom,  - head CT negative for acute abnormality.   - Thoracic and lumbar x-rays are negative for fractures.   - Falls likely secondary to vision problems, he has also had amputation of some of his toes.  He ambulates with a cane.   Acute metabolic encephalopathy  -Mentation has much improved, -On admission.  Lethargic, per family has been that way for past few days -Likely contributed from dehydration, AKI -Head CT without acute abnormality.   -  UA not suggestive of UTI.   - History of seizure, last seizure over a year ago .Mar[place] Kitchen  No typical signs of seizures or postictal findings -  UDS clean. -Chest x-ray-clean negative any cardiopulmonary disease  -We will continue with IV fluid hydration, withholding offending medication such as gabapentin, analgesics    Chronic combined systolic (congestive) and diastolic (congestive) heart failure (HCC) Stable and compensated.  Admitted for AKI.  Last echo 03/2022, EF of 50 to 55%, with grade 1 DD. -Hold Lasix for now -He is on hydralazine 100 mg 3 times daily,  hold for now,  -Resume Imdur, carvedilol - Monitoring daily weight and I's and O's closely  -Patient ICD, defibrillator, reporting of no issues since it has been implanted  Diabetic neuropathy ([place]) Controlled.  A1c 6.8.  Blood glucose 178 -We will check his blood sugar QA CHS, SSI coverage -Resume home Lantus at reduced dose 8 units daily (home dose 12 units)   Seizure ([place]) Family reports last seizure was about a year ago at least.  On gabapentin, otherwise not on medication.   Essential hypertension Systolic 621V to 471. - Hold Lasix for now - On hydralazine 100 mg 3 times daily, hold for now, resume in a.m. pending stable blood pressure -Resume Imdur, carvedilol, tamsulosin

## 2022-08-02 NOTE — Consult Note (Signed)
Reason for Consult: AKI/CKD stage IIIb Referring Physician: Roger Shelter, MD  Jeffrey [name] is an 56 y.o. male has a PMH significant for  HTN, DM type 2, PAD, h/o CVA, NICM, s/p AICD, combined systolic and diastolic CHF, and CKD stage IIIb-IV who was brought to [facility] ED on 08/01/22 after a fall at home.  Family who accompanied him reports that he has been having some confusion and altered mental status for several days and sleeping more.  They also report that he was not eating or drinking.  In the ED, BP 100/61, SpO2 99%, labs notable for K 5.9, Co2 19, BUN 67, Cr 4.16, Hgb 10.6.  He was admitted for further testing and we were consulted to help evaluate and manage his AKI/CKD.  The trend in Scr is seen below.    He remains somewhat lethargic but oriented to person, place, and time.  Denies any N/V/D or new medications.  Reports that he fell in the bathroom but only down for 15 minutes.  No dysuria, pyuria, hematuria, urgency, frequency, or retention.  No lower extremity edema, SOB, orthopnea, or PND.   Trend in Creatinine: Creatinine, Ser  Date/Time Value Ref Range Status  08/02/2022 04:26 AM 3.28 (H) 0.61 - 1.24 mg/dL Final  08/01/2022 04:15 PM 4.16 (H) 0.61 - 1.24 mg/dL Final  07/24/2022 08:45 PM 2.69 (H) 0.61 - 1.24 mg/dL Final  07/05/2022 03:52 PM 2.91 (H) 0.61 - 1.24 mg/dL Final  04/01/2022 01:56 AM 3.29 (H) 0.61 - 1.24 mg/dL Final  03/31/2022 06:38 AM 2.98 (H) 0.61 - 1.24 mg/dL Final  03/17/2022 03:20 AM 3.16 (H) 0.61 - 1.24 mg/dL Final  03/16/2022 01:01 AM 3.54 (H) 0.61 - 1.24 mg/dL Final  03/15/2022 01:13 AM 3.06 (H) 0.61 - 1.24 mg/dL Final  03/14/2022 01:43 AM 2.76 (H) 0.61 - 1.24 mg/dL Final  03/13/2022 12:52 AM 2.77 (H) 0.61 - 1.24 mg/dL Final  03/12/2022 12:44 AM 2.84 (H) 0.61 - 1.24 mg/dL Final  03/11/2022 12:42 AM 2.42 (H) 0.61 - 1.24 mg/dL Final  03/10/2022 03:00 PM 2.34 (H) 0.61 - 1.24 mg/dL Final  03/09/2022 04:39 PM 2.32 (H) 0.76 - 1.27 mg/dL Final  03/06/2022 01:06 AM 2.11  (H) 0.61 - 1.24 mg/dL Final  11/24/2021 11:59 AM 2.04 (H) 0.61 - 1.24 mg/dL Final  11/09/2021 10:07 AM 2.32 (H) 0.76 - 1.27 mg/dL Final  10/07/2021 01:43 PM 2.10 (H) 0.61 - 1.24 mg/dL Final  08/13/2021 03:16 PM 1.71 (H) 0.61 - 1.24 mg/dL Final  08/07/2021 04:08 AM 1.97 (H) 0.61 - 1.24 mg/dL Final  08/06/2021 08:01 PM 1.98 (H) 0.61 - 1.24 mg/dL Final  08/06/2021 05:51 PM 2.00 (H) 0.61 - 1.24 mg/dL Final  08/06/2021 05:37 PM 2.06 (H) 0.61 - 1.24 mg/dL Final  08/03/2021 01:38 PM 1.76 (H) 0.61 - 1.24 mg/dL Final  07/02/2021 12:37 AM 1.75 (H) 0.61 - 1.24 mg/dL Final  07/01/2021 03:23 AM 1.51 (H) 0.61 - 1.24 mg/dL Final  07/01/2021 02:33 AM 1.58 (H) 0.61 - 1.24 mg/dL Final  06/30/2021 11:06 PM 1.64 (H) 0.61 - 1.24 mg/dL Final  06/30/2021 04:55 PM 2.00 (H) 0.61 - 1.24 mg/dL Final  06/30/2021 04:25 PM 2.24 (H) 0.61 - 1.24 mg/dL Final  03/30/2021 11:30 AM 1.50 (H) 0.76 - 1.27 mg/dL Final  01/27/2021 12:27 PM 1.36 (H) 0.76 - 1.27 mg/dL Final  12/22/2020 01:24 AM 1.59 (H) 0.61 - 1.24 mg/dL Final  08/25/2020 01:36 PM 1.32 (H) 0.61 - 1.24 mg/dL Final  07/14/2020 12:55 PM 1.37 (H) 0.61 - 1.24 mg/dL  Final  06/16/2020 06:06 AM 1.25 (H) 0.61 - 1.24 mg/dL Final  06/15/2020 09:42 AM 1.43 (H) 0.61 - 1.24 mg/dL Final  06/14/2020 03:26 AM 1.30 (H) 0.61 - 1.24 mg/dL Final  06/13/2020 12:27 AM 1.24 0.61 - 1.24 mg/dL Final  06/12/2020 05:26 PM 1.40 (H) 0.61 - 1.24 mg/dL Final  06/12/2020 04:21 PM 1.48 (H) 0.61 - 1.24 mg/dL Final  12/02/2019 10:57 PM 1.47 (H) 0.61 - 1.24 mg/dL Final  09/29/2019 04:46 PM 1.26 (H) 0.61 - 1.24 mg/dL Final  04/25/2019 09:25 AM 1.39 (H) 0.76 - 1.27 mg/dL Final  10/17/2018 03:52 AM 1.24 0.61 - 1.24 mg/dL Final  10/16/2018 07:12 PM 0.90 0.61 - 1.24 mg/dL Final  10/16/2018 07:03 PM 1.10 0.61 - 1.24 mg/dL Final  06/22/2018 12:19 PM 1.01 0.61 - 1.24 mg/dL Final  06/22/2018 11:08 AM 1.22 0.76 - 1.27 mg/dL Final  03/21/2018 10:33 AM 0.99 0.76 - 1.27 mg/dL Final  01/16/2018 04:36 AM  0.95 0.61 - 1.24 mg/dL Final    PMH:   Past Medical History:  Diagnosis Date   AICD (automatic cardioverter/defibrillator) present    [employer]   AICD (automatic cardioverter/defibrillator) present    MDT Visia AF MRI   Anemia    CAD (coronary artery disease)    a. cath 01/31/17: 60% 1st RPLB, 60% dist RCA, 55% prox RCA, 10% pro LAD --> Rx TX.    CHF (congestive heart failure) (HCC)    Chronic systolic CHF (congestive heart failure) ([place]) 01/28/2017   1. Echo 01/29/17:  EF 20-25, normal wall motion, mild LAE // 2. EF 10-15 by [facility] 01/2017    Diabetes mellitus without complication ([place])    type 2   Diabetic foot infection ([place]) 03/2016   RT FOOT   Dyspnea    History of kidney stones    passed   HTN (hypertension)    Hyperlipidemia    Hypertension    Myocardial infarction [facility]), although reported, NICM at cath    NICM (nonischemic cardiomyopathy) ([place]) 02/15/2017   1. Mod non-obs CAD on LHC in 01/2017 - CAD does not explain cardiomyopathy   Renal disorder    stage 4    PSH:   Past Surgical History:  Procedure Laterality Date   AIR/FLUID EXCHANGE Right 07/14/2020   Procedure: AIR/FLUID EXCHANGE;  Surgeon: Jalene Mullet, MD;  Location: [place];  Service: Ophthalmology;  Laterality: Right;   AMPUTATION Right 04/01/2016   Procedure: Right Great Toe Amputation;  Surgeon: Newt Minion, MD;  Location: [place];  Service: Orthopedics;  Laterality: Right;   AMPUTATION Right 06/19/2016   Procedure: AMPUTATION SECOND TOE;  Surgeon: Marybelle Killings, MD;  Location: [place];  Service: Orthopedics;  Laterality: Right;   BACK SURGERY     for abscess   CARDIAC CATHETERIZATION     ICD IMPLANT N/A 01/15/2018   Procedure: ICD IMPLANT;  Surgeon: Deboraha Sprang, MD;  Location: [place] CV LAB;  Service: Cardiovascular;  Laterality: N/A;   INJECTION OF SILICONE OIL Right 02/12/9178   Procedure: INJECTION OF SILICONE OIL;  Surgeon: Jalene Mullet, MD;  Location: [place];  Service: Ophthalmology;  Laterality:  Right;   INJECTION OF SILICONE OIL Right 1/50/5697   Procedure: INJECTION OF SILICONE OIL;  Surgeon: Jalene Mullet, MD;  Location: [place];  Service: Ophthalmology;  Laterality: Right;   INJECTION OF SILICONE OIL Left 9/48/0165   Procedure: INJECTION OF SILICONE OIL;  Surgeon: Jalene Mullet, MD;  Location: [place];  Service: Ophthalmology;  Laterality: Left;  INSERT / REPLACE / REMOVE PACEMAKER     MEMBRANE PEEL Right 08/25/2020   Procedure: MEMBRANE PEEL;  Surgeon: Jalene Mullet, MD;  Location: [place];  Service: Ophthalmology;  Laterality: Right;   MEMBRANE PEEL Left 07/05/2022   Procedure: MEMBRANECTOMY;  Surgeon: Jalene Mullet, MD;  Location: [place];  Service: Ophthalmology;  Laterality: Left;   PARS PLANA VITRECTOMY Right 07/14/2020   Procedure: PARS PLANA VITRECTOMY WITH 25 GAUGE, Membranetomy, drainage of subretinal fluid;  Surgeon: Jalene Mullet, MD;  Location: [place];  Service: Ophthalmology;  Laterality: Right;   PARS PLANA VITRECTOMY Right 08/25/2020   Procedure: PARS PLANA VITRECTOMY WITH 25 GAUGE;  Surgeon: Jalene Mullet, MD;  Location: [place];  Service: Ophthalmology;  Laterality: Right;   PARS PLANA VITRECTOMY Left 07/05/2022   Procedure: LEFT PARS PLANA VITRECTOMY WITH 25 GAUGE;  Surgeon: Jalene Mullet, MD;  Location: [place];  Service: Ophthalmology;  Laterality: Left;   PHOTOCOAGULATION WITH LASER Right 07/14/2020   Procedure: PHOTOCOAGULATION WITH LASER;  Surgeon: Jalene Mullet, MD;  Location: [place];  Service: Ophthalmology;  Laterality: Right;   PHOTOCOAGULATION WITH LASER Right 08/25/2020   Procedure: PHOTOCOAGULATION WITH LASER;  Surgeon: Jalene Mullet, MD;  Location: [place];  Service: Ophthalmology;  Laterality: Right;   PHOTOCOAGULATION WITH LASER Left 07/05/2022   Procedure: PHOTOCOAGULATION WITH LASER;  Surgeon: Jalene Mullet, MD;  Location: [place];  Service: Ophthalmology;  Laterality: Left;   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   Procedure:  REPAIR OF HEMORRHAGIC DETACHMENT;  Surgeon: Jalene Mullet, MD;  Location: [place];  Service: Ophthalmology;  Laterality: Right;   RIGHT/LEFT HEART CATH AND CORONARY ANGIOGRAPHY N/A 01/31/2017   Procedure: Right/Left Heart Cath and Coronary Angiography;  Surgeon: Troy Sine, MD;  Location: [place] CV LAB;  Service: Cardiovascular;  Laterality: N/A;   SILICON OIL REMOVAL Right 2/80/0349   Procedure: SILICON OIL REMOVAL;  Surgeon: Jalene Mullet, MD;  Location: [place];  Service: Ophthalmology;  Laterality: Right;    Allergies: No Known Allergies  Medications:   Prior to Admission medications   Medication Sig Start Date End Date Taking? Authorizing Provider  albuterol (VENTOLIN HFA) 108 (90 Base) MCG/ACT inhaler Inhale 1 to 2 puffs by mouth into the lungs every 6 hours as needed for wheezing or shortness of breath. 10/07/21  Yes Freeman Caldron M, PA-C  carvedilol (COREG) 12.5 MG tablet Take 12.5 mg by mouth 2 (two) times daily with a meal. 07/11/22  Yes [provider]  cetirizine (ZYRTEC) 10 MG tablet TAKE 1 TABLET (10 MG TOTAL) BY MOUTH DAILY. 07/27/20  Yes Charlott Rakes, MD  Cholecalciferol (D3-50) 1.25 MG (50000 UT) capsule Take 1 capsule by mouth once weekly 03/07/22  Yes Claudia Desanctis, MD  clindamycin (CLEOCIN) 150 MG capsule Take 1 capsule (150 mg total) by mouth every 6 (six) hours. 07/25/22  Yes Mesner, Corene Cornea, MD  furosemide (LASIX) 40 MG tablet take 1 tablet by mouth twice a day 07/11/22  Yes   gabapentin (NEURONTIN) 300 MG capsule TAKE 2 CAPSULES BY MOUTH AT BEDTIME 04/27/22  Yes   hydrALAZINE (APRESOLINE) 50 MG tablet take 1 tablet by mouth three times a day for hypertension Patient taking differently: Take 100 mg by mouth 3 (three) times daily. 07/11/22  Yes   Insulin Glargine Solostar (LANTUS) 100 UNIT/ML Solostar Pen inject 12-14 units subcutaneously in the morning Patient taking differently: Inject 12-15 Units into the skin in the morning. 07/27/22  Yes   isosorbide  mononitrate (IMDUR) 30  MG 24 hr tablet Take 30 mg by mouth daily. 07/11/22  Yes [provider]  lisinopril (ZESTRIL) 10 MG tablet take 1 tablet by mouth daily 07/11/22  Yes   ondansetron (ZOFRAN) 4 MG tablet Take 1 tablet (4 mg total) by mouth every 6 (six) hours as needed for nausea or vomiting. 76/16/07  Yes Delora Fuel, MD  potassium chloride SA (KLOR-CON M) 20 MEQ tablet TAKE 2 TABLETS BY MOUTH DAILY 04/27/22  Yes   prednisoLONE acetate (PRED FORTE) 1 % ophthalmic suspension INSTILL 1 DROP INTO THE LEFT EYE FOUR TIMES A DAY x 7 days, THEN THREE TIMES A DAY x 7 days, THEN TWICE A DAY x 7 days, THEN DAILY X 1 WEEK Patient taking differently: Place 1 drop into the left eye 2 (two) times daily. 07/06/22  Yes Jalene Mullet, MD  Semaglutide,0.25 or 0.5MG/DOS, 2 MG/3ML SOPN Inject 0.5 mg into the skin once a week. 07/26/22  Yes [name], MD  tamsulosin (FLOMAX) 0.4 MG CAPS capsule TAKE 1 CAPSULE BY MOUTH AT BEDTIME (STOP IF DIZZINESS OCCURS) 04/27/22  Yes   Accu-Chek Softclix Lancets lancets Use to check blood sugar three times daily E11.65 08/12/21   Charlott Rakes, MD  Blood Glucose Monitoring Suppl (ACCU-CHEK GUIDE) w/Device KIT Use to check blood sugar three times daily E11.65 08/12/21   Charlott Rakes, MD  Continuous Blood Gluc Receiver (FREESTYLE LIBRE 2 READER) DEVI Use as directed four to five times daily. 03/29/22     Continuous Blood Gluc Sensor (FREESTYLE LIBRE 2 SENSOR) MISC Use as directed, to montior blood sugar 4 to 5 times a day daily. 03/29/22     Elastic Bandages & Supports (T.E.D. KNEE LENGTH/S-LONG) MISC 1 (one) each daily, apply compression stockings daily to help decrease leg swelling 11/11/21     gabapentin (NEURONTIN) 300 MG capsule take 2 capsules by mouth daily at bedtime 07/11/22     glucose blood (ACCU-CHEK GUIDE) test strip Use to check blood sugar three times daily E11.65 08/12/21   Charlott Rakes, MD  Insulin Glargine Solostar (LANTUS) 100 UNIT/ML Solostar Pen  Inject 12-14 Units into the skin daily. Patient not taking: Reported on 08/01/2022 07/08/22   [name], MD  Insulin Pen Needle (BD PEN NEEDLE NANO U/F) 32G X 4 MM MISC USE TO INJECT LANTUS DAILY. MUST USE NEW PEN NEEDLE WITH EACH INJECTION. 04/18/22   [name], MD  potassium chloride SA (KLOR-CON M) 20 MEQ tablet TAKE 2 TABLETS BY MOUTH DAILY 07/11/22     tamsulosin (FLOMAX) 0.4 MG CAPS capsule TAKE 1 CAPSULE BY MOUTH DAILY AT BEDTIME ( STOP IF DIZZINESS OCCURS) Patient not taking: Reported on 08/01/2022 07/11/22     amLODipine (NORVASC) 10 MG tablet Take 1 tablet (10 mg total) by mouth daily. 03/17/22 04/27/22  Thurnell Lose, MD    Inpatient medications:  carvedilol  12.5 mg Oral BID WC   heparin  5,000 Units Subcutaneous Q8H   insulin aspart  0-5 Units Subcutaneous QHS   insulin aspart  0-9 Units Subcutaneous TID WC   insulin glargine-yfgn  8 Units Subcutaneous Daily   isosorbide mononitrate  30 mg Oral Daily   lidocaine  1 patch Transdermal Q24H   tamsulosin  0.4 mg Oral QPC supper    Discontinued Meds:   Medications Discontinued During This Encounter  Medication Reason   ofloxacin (OCUFLOX) 0.3 % ophthalmic solution Patient Preference   lisinopril (ZESTRIL) 20 MG tablet Change in therapy   Cholecalciferol 1.25 MG (50000 UT) capsule Patient  Preference   sodium zirconium cyclosilicate (LOKELMA) packet 10 g     Social History:  reports that he has never smoked. He has never used smokeless tobacco. He reports that he does not drink alcohol and does not use drugs.  Family History:   Family History  Problem Relation Age of Onset   Diabetes Mother    Hypertension Mother    Diabetes Father    Heart attack Father    Diabetes Sister    Heart attack Maternal Grandmother     Pertinent items are noted in HPI. Weight change:   Intake/Output Summary (Last 24 hours) at 08/02/2022 1206 Last data filed at 08/02/2022 0800 Gross per 24 hour  Intake 614.46 ml  Output --   Net 614.46 ml   BP (!) 139/95 (BP Location: Left Arm)   Pulse 83   Temp 98.8 F (37.1 C) (Oral)   Resp 18   Ht 5' 7" (1.702 m)   Wt 93.7 kg   SpO2 99%   BMI 32.35 kg/m  Vitals:   08/01/22 1900 08/01/22 2000 08/01/22 2136 08/02/22 0500  BP: (!) 153/88 139/81 137/85 (!) 139/95  Pulse: 75 78 76 83  Resp: (!) _0 Temp:  98.2 F (36.8 C) 98.5 F (36.9 C) 98.8 F (37.1 C)  TempSrc:   Oral Oral  SpO2: 99% 96% 100% 99%  Weight:   93.7 kg   Height:   5' 7" (1.702 m)      General appearance: fatigued, no distress, and slowed mentation Head: Normocephalic, without obvious abnormality, atraumatic Resp: clear to auscultation bilaterally Cardio: regular rate and rhythm, S1, S2 normal, no murmur, click, rub or gallop GI: soft, non-tender; bowel sounds normal; no masses,  no organomegaly Extremities: extremities normal, atraumatic, no cyanosis or edema  Labs: Basic Metabolic Panel: Recent Labs  Lab 08/01/22 1615 08/02/22 0426  NA 135 137  K 5.9* 5.0  CL 110 115*  CO2 19* 19*  GLUCOSE 178* 118*  BUN 67* 60*  CREATININE 4.16* 3.28*  CALCIUM 9.1 8.6*   Liver Function Tests: No results for input(s): "AST", "ALT", "ALKPHOS", "BILITOT", "PROT", "ALBUMIN" in the last 168 hours. No results for input(s): "LIPASE", "AMYLASE" in the last 168 hours. No results for input(s): "AMMONIA" in the last 168 hours. CBC: Recent Labs  Lab 08/01/22 1615 08/02/22 0426  WBC 6.2 5.1  NEUTROABS 4.3  --   HGB 10.6* 9.5*  HCT 34.4* 31.0*  MCV 82.9 83.8  PLT 264 245   PT/INR: _1 (inr:5) Cardiac Enzymes: )No results for input(s): "CKTOTAL", "CKMB", "CKMBINDEX", "TROPONINI" in the last 168 hours. CBG: Recent Labs  Lab 08/01/22 2019 08/02/22 0724 08/02/22 1124  GLUCAP 138* 143* 164*    Iron Studies: No results for input(s): "IRON", "TIBC", "TRANSFERRIN", "FERRITIN" in the last 168 hours.  Xrays/Other Studies: DG CHEST PORT 1 VIEW  Result Date: 08/01/2022 CLINICAL  DATA:  Fall EXAM: PORTABLE CHEST 1 VIEW COMPARISON:  07/24/2022 FINDINGS: Left AICD remains in place, unchanged. Right stimulator battery pack noted, unchanged. Heart and mediastinal contours are within normal limits. No focal opacities or effusions. No acute bony abnormality. IMPRESSION: No active cardiopulmonary disease. Electronically Signed   By: Rolm Baptise M.D.   On: 08/01/2022 20:13   DG Thoracic Spine 2 View  Result Date: 08/01/2022 CLINICAL DATA:  Pain after fall. EXAM: THORACIC SPINE 2 VIEWS; LUMBAR SPINE - COMPLETE 4+ VIEW COMPARISON:  None Available. FINDINGS: Thoracic spine: There is no evidence of thoracic spine fracture.  Alignment is normal. No other significant bone abnormalities are identified. Lumbar spine: No acute fracture or subluxation. Multilevel degenerative disease with disc height loss and prominent anterior osteophytes. Multilevel facet joint arthropathy. IMPRESSION: 1. No acute fracture of the thoracic or lumbar spine. Vertebral body heights are maintained. 2. 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 Electronically Signed   By: Keane Police D.O.   On: 08/01/2022 17:39   DG Lumbar Spine Complete  Result Date: 08/01/2022 CLINICAL DATA:  Pain after fall. EXAM: THORACIC SPINE 2 VIEWS; LUMBAR SPINE - COMPLETE 4+ VIEW COMPARISON:  None Available. FINDINGS: Thoracic spine: There is no evidence of thoracic spine fracture. Alignment is normal. No other significant bone abnormalities are identified. Lumbar spine: No acute fracture or subluxation. Multilevel degenerative disease with disc height loss and prominent anterior osteophytes. Multilevel facet joint arthropathy. IMPRESSION: 1. No acute fracture of the thoracic or lumbar spine. Vertebral body heights are maintained. 2. 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 Electronically Signed   By: Keane Police D.O.   On: 08/01/2022 17:39   CT Head Wo Contrast  Result Date: 08/01/2022 CLINICAL DATA:  Mental status change, unknown cause; Facial trauma, blunt EXAM: CT HEAD WITHOUT CONTRAST CT MAXILLOFACIAL WITHOUT CONTRAST TECHNIQUE: Multidetector CT imaging of the head and maxillofacial structures were performed using the standard protocol without intravenous contrast. Multiplanar CT image reconstructions of the maxillofacial structures were also generated. RADIATION DOSE REDUCTION: This exam was performed according to the departmental dose-optimization program which includes automated exposure control, adjustment of the mA and/or kV according to patient size and/or use of iterative reconstruction technique. COMPARISON:  CT head and face 07/24/2022 FINDINGS: CT HEAD FINDINGS Brain: No acute intracranial hemorrhage, mass effect, or edema. No new loss of gray-white differentiation. Stable findings of probable chronic microvascular ischemic changes with superimposed chronic small vessel infarcts in the cerebral white matter. Ventricles are stable in size. No extra-axial collection. Vascular: No new finding. Skull: Unremarkable. Other: Mastoid air cells are clear. CT MAXILLOFACIAL FINDINGS Osseous: Redemonstrated facial fractures.  No new finding. Orbits: Increased density within bilateral globes reflecting retinal detachment therapy. No new abnormality. Sinuses: Mild mucosal thickening. Right maxillary sinus retention cyst or polyp. Soft tissues: No new finding.  Decreased soft tissue swelling. IMPRESSION: No acute intracranial abnormality. Decreased soft tissue swelling. Fractures as seen previously. Electronically Signed   By: Macy Mis M.D.   On: 08/01/2022 16:30   CT Maxillofacial Wo Contrast  Result Date: 08/01/2022 CLINICAL DATA:  Mental status change, unknown cause; Facial trauma, blunt EXAM: CT HEAD WITHOUT  CONTRAST CT MAXILLOFACIAL WITHOUT CONTRAST TECHNIQUE: Multidetector CT imaging of the head and maxillofacial structures were performed using the standard protocol without intravenous contrast. Multiplanar CT image reconstructions of the maxillofacial structures were also generated. RADIATION DOSE REDUCTION: This exam was performed according to the departmental dose-optimization program which includes automated exposure control, adjustment of the mA and/or kV according to patient size and/or use of iterative reconstruction technique. COMPARISON:  CT head and face 07/24/2022 FINDINGS: CT HEAD FINDINGS Brain: No acute intracranial hemorrhage, mass effect, or edema. No new loss of gray-white differentiation. Stable findings of probable chronic microvascular ischemic changes with superimposed chronic small vessel infarcts in the cerebral white matter. Ventricles are stable in size. No extra-axial collection. Vascular: No new finding. Skull: Unremarkable. Other: Mastoid air cells  are clear. CT MAXILLOFACIAL FINDINGS Osseous: Redemonstrated facial fractures.  No new finding. Orbits: Increased density within bilateral globes reflecting retinal detachment therapy. No new abnormality. Sinuses: Mild mucosal thickening. Right maxillary sinus retention cyst or polyp. Soft tissues: No new finding.  Decreased soft tissue swelling. IMPRESSION: No acute intracranial abnormality. Decreased soft tissue swelling. Fractures as seen previously. Electronically Signed   By: Macy Mis M.D.   On: 08/01/2022 16:30     Assessment/Plan:  AKI/CKD stage IIIb-IV - presumably due to ischemic ATN in setting of decreased po intake with concomitant diuretics and ace inhibition.  Given bolus of IVF's overnight with improvement of BUN/Cr.  No indication for dialysis at this time.  Continue to hold lasix and lisinopril for now.   Avoid nephrotoxic medications including NSAIDs and iodinated intravenous contrast exposure unless the latter is  absolutely indicated. Preferred narcotic agents for pain control are hydromorphone, fentanyl, and methadone. Morphine should not be used.  Avoid Baclofen and avoid oral sodium phosphate and magnesium citrate based laxatives / bowel preps.  Continue strict Input and Output monitoring. Will monitor the patient closely with you and intervene or adjust therapy as indicated by changes in clinical status/labs   Hyperkalemia - due to #1 - improved with IVF's and holding ACE. Acute metabolic encephalopathy - lethargic but oriented today.  CT scan negative.  Agree with holding gabapentin in light of AKI and accumulation.  Falls - per family, frequent falls at home.  Pt is visually impaired.  Will likely need PT/OT evaluation. Chronic combined systolic and diastolic CHF - appears euvolemic today.  Continue to hold lasix and lisinopril for now.  DM type 2 - per primary Sz disorder - consider EEG HTN - stable.  Continue to hold lisinopril and lasix for now.    Governor Rooks Colad[place]ato 08/02/2022, 12:06 PM

## 2022-08-02 NOTE — Progress Notes (Signed)
PROGRESS NOTE    Patient: Jeffrey [name]                            PCP: Willene Hatchet, NP                    DOB: [birth year]            DOA: 08/01/2022 NID:[id number]             DOS: 08/02/2022, 2:10 PM   LOS: 0 days   Date of Service: The patient was seen and examined on 08/02/2022  Subjective:   The patient was seen and examined this morning. Stable at this time. Much more awake alert oriented, no acute distress complain of generalized gases. Otherwise no issues overnight .  Brief Narrative:   Jeffrey [name] is a 56 y.o. male with medical history significant for chronic sCHF,, AICD, DMII, kidney stones, HTN, CAD, seizures Patient was brought to the ED reports of a fall this morning.   At the time of evaluation, patient appears a bit lethargic, but able to answer questions appropriately.  Mother and girlfriend at bedside.  Patient has been staying with his mom over the past week, prior to this, he stayed his girlfriend.  Mother reports over the past week he has fallen about 4 times.  His last fall was this morning in the bathroom.  He has a history of seizures but did doubt he had a seizure.  When he fell his mother heard him and immediately went to see him.  He was awake, did not lose consciousness.  At baseline he has a tendency to fall, as he has vision problems and gait problems. Reports difficulty breathing over the past week that is intermittent, and cough.  No fevers no chills.  He was started on Ozempic about a month ago, and since then his appetite has reduced.  No vomiting no loose stools.  He has also been taking Lasix 40 mg twice every day.  For the past few days, he has been sleeping more.  And has just not been himself.  Today he has been reporting back pain after the fall, otherwise denies any other pain.  Does not drink alcohol or use drugs.   ED Course:  Temperature 98.1.  Heart rate 73-79.  Respiratory rate 16-22.  Blood pressure systolic 144-315.  O2 sats  greater than 96% on room. Creatinine elevated at 4.16.  Serum bicarb 19.  Potassium 5.9.  Sodium 135.  Head CT without acute abnormality, thoracic and lumbar x-ray unremarkable. 1 L bolus given. UA not suggestive of UTI Lokelma, 5 units of NovoLog, D50, and albuterol nebs given for hyperkalemia. Hospitalist to admit for AKI on CKD.   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  Creatinine elevated at 4.16, baseline 2.6-3.2.   Last check 8 days ago, creatinine was 2.69.  BUN of 67.   Likely prerenal, from poor recent oral intake, and on Lasix 40 mg twice daily. -1 L bolus given, continue IV fluid maintenance  -Hold home Lasix, also lisinopril 10 mg   Falls -Frequent falls, - Pt/Ot  - Last fall this morning in the bathroom,  - head CT negative for acute abnormality.   - Thoracic and lumbar x-rays are negative for fractures.   - Falls likely secondary to vision problems, he has also had amputation of some of his toes.  He ambulates with a cane.   Acute metabolic encephalopathy  -Mentation has much improved, -On admission.  Lethargic, per family has been that way for past few days -Likely contributed from dehydration, AKI -Head CT without acute abnormality.   -UA not suggestive of UTI.   - History of  seizure, last seizure over a year ago .Mar[place] Kitchen  No typical signs of seizures or postictal findings -  UDS clean. -Chest x-ray-clean negative any cardiopulmonary disease  -We will continue with IV fluid hydration, withholding offending medication such as gabapentin, analgesics    Chronic combined systolic (congestive) and diastolic (congestive) heart failure (HCC) Stable and compensated.  Admitted for AKI.  Last echo 03/2022, EF of 50 to 55%, with grade 1 DD. -Hold Lasix for now -He is on hydralazine 100 mg 3 times daily, hold for now,  -Resume Imdur, carvedilol - Monitoring daily weight and I's and O's closely  -Patient ICD, defibrillator, reporting of no issues since it has been implanted  Diabetic neuropathy ([place]) Controlled.  A1c 6.8.  Blood glucose 178 -We will check his blood sugar QA CHS, SSI coverage -Resume home Lantus at reduced dose 8 units daily (home dose 12 units)   Seizure ([place]) Family reports last seizure was about a year ago at least.  On gabapentin, otherwise not on medication.   Essential hypertension Systolic 947M to 546. - Hold Lasix for now - On hydralazine 100 mg 3 times daily, hold for now, resume in a.m. pending stable blood pressure -Resume Imdur, carvedilol, tamsulosin     ----------------------------------------------------------------------------------------------------------------------------------------------- Nutritional status:  The patient's BMI is: Body mass index is 32.35 kg/m. I agree with the assessment and plan as outlined    ------------------------------------------------------------------------------------------------------------------------------------------------  DVT prophylaxis:  heparin injection 5,000 Units Start: 08/01/22 2300   Code Status:   Code Status: Full Code  Family Communication: Fianc on the phone updated 08/02/2022 The above findings and plan of care has been discussed with patient (and family)  in detail,  they  expressed understanding and agreement of above. -Advance care planning has been discussed.   Admission status:   Status is: Inpatient Remains inpatient appropriate because: Continuous monitoring for encephalopathy, acute kidney injury,     Procedures:   No admission procedures for hospital encounter.   Antimicrobials:  Anti-infectives (From admission, onward)    None        Medication:   carvedilol  12.5 mg Oral BID WC   heparin  5,000 Units Subcutaneous Q8H   insulin aspart  0-5 Units Subcutaneous QHS   insulin aspart  0-9 Units Subcutaneous TID WC   insulin glargine-yfgn  8 Units Subcutaneous Daily   isosorbide mononitrate  30 mg Oral Daily   lidocaine  1 patch Transdermal Q24H   tamsulosin  0.4 mg Oral QPC supper    acetaminophen **OR** acetaminophen, ondansetron **OR** ondansetron (ZOFRAN) IV, mouth rinse, polyethylene glycol  Objective:   Vitals:   08/01/22 2000 08/01/22 2136 08/02/22 0500 08/02/22 1329  BP: 139/81 137/85 (!) 139/95 135/88  Pulse: 78 76 83 73  Resp: 16 20 18 20   Temp: 98.2 F (36.8 C) 98.5 F (36.9 C) 98.8 F (37.1 C) 98.5 F (36.9 C)  TempSrc:  Oral Oral   SpO2: 96% 100% 99% 100%  Weight:  93.7 kg    Height:  5\' 7"  (1.702 m)      Intake/Output Summary (Last 24 hours) at 08/02/2022 1410 Last data filed at 08/02/2022 0800 Gross per 24 hour  Intake 614.46 ml  Output --  Net 614.46 ml   Filed Weights   08/01/22 1522 08/01/22 2136  Weight: 80.7 kg 93.7 kg     Examination:   Physical Exam  Constitution:  Alert, cooperative, no distress,  Appears calm and comfortable  Psychiatric:   Normal and stable mood and affect, cognition intact,   HEENT:      Poor visual acuity normocephalic, PERRL, otherwise with in Normal limits  Chest:         Chest symmetric Cardio vascular:  S1/S2, RRR, No murmure, No Rubs or Gallops  pulmonary: Clear to auscultation bilaterally, respirations unlabored, negative wheezes / crackles Abdomen: Soft,  non-tender, non-distended, bowel sounds,no masses, no organomegaly Muscular skeletal: Limited exam -global generalized weaknesses  in bed, able to move all 4 extremities,   Neuro: CNII-XII intact. , normal motor and sensation, reflexes intact  Extremities: No pitting edema lower extremities, +2 pulses  Skin: Dry, warm to touch, negative for any Rashes, No open wounds Wounds: per nursing documentation   ------------------------------------------------------------------------------------------------------------------------------------------    LABs:     Latest Ref Rng & Units 08/02/2022    4:26 AM 08/01/2022    4:15 PM 07/24/2022    8:45 PM  CBC  WBC 4.0 - 10.5 K/uL 5.1  6.2  9.8   Hemoglobin 13.0 - 17.0 g/dL 9.5  10.6  11.2   Hematocrit 39.0 - 52.0 % 31.0  34.4  36.9   Platelets 150 - 400 K/uL 245  264  294       Latest Ref Rng & Units 08/02/2022    4:26 AM 08/01/2022    4:15 PM 07/24/2022    8:45 PM  CMP  Glucose 70 - 99 mg/dL 118  178  291   BUN 6 - 20 mg/dL 60  67  42   Creatinine 0.61 - 1.24 mg/dL 3.28  4.16  2.69   Sodium 135 - 145 mmol/L 137  135  135   Potassium 3.5 - 5.1 mmol/L 5.0  5.9  4.6   Chloride 98 - 111 mmol/L 115  110  109   CO2 22 - 32 mmol/L 19  19  19    Calcium 8.9 - 10.3 mg/dL 8.6  9.1  9.4   Total Protein 6.5 - 8.1 g/dL   7.5   Total Bilirubin 0.3 - 1.2 mg/dL   0.4   Alkaline Phos 38 - 126 U/L   67   AST 15 - 41 U/L   20   ALT 0 - 44 U/L   20        Micro Results No results found for this or any previous visit (from the past 240 hour(s)).  Radiology Reports DG CHEST PORT 1 VIEW  Result Date: 08/01/2022 CLINICAL DATA:  Fall EXAM: PORTABLE CHEST 1 VIEW COMPARISON:  07/24/2022 FINDINGS: Left AICD remains in place, unchanged. Right stimulator battery pack noted, unchanged. Heart  and mediastinal contours are within normal limits. No focal opacities or effusions. No acute bony abnormality. IMPRESSION: No active cardiopulmonary disease. Electronically  Signed   By: Rolm Baptise M.D.   On: 08/01/2022 20:13   DG Thoracic Spine 2 View  Result Date: 08/01/2022 CLINICAL DATA:  Pain after fall. EXAM: THORACIC SPINE 2 VIEWS; LUMBAR SPINE - COMPLETE 4+ VIEW COMPARISON:  None Available. FINDINGS: Thoracic spine: There is no evidence of thoracic spine fracture. Alignment is normal. No other significant bone abnormalities are identified. Lumbar spine: No acute fracture or subluxation. Multilevel degenerative disease with disc height loss and prominent anterior osteophytes. Multilevel facet joint arthropathy. IMPRESSION: 1. No acute fracture of the thoracic or lumbar spine. Vertebral body heights are maintained. 2. 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 Electronically Signed   By: Keane Police D.O.   On: 08/01/2022 17:39   DG Lumbar Spine Complete  Result Date: 08/01/2022 CLINICAL DATA:  Pain after fall. EXAM: THORACIC SPINE 2 VIEWS; LUMBAR SPINE - COMPLETE 4+ VIEW COMPARISON:  None Available. FINDINGS: Thoracic spine: There is no evidence of thoracic spine fracture. Alignment is normal. No other significant bone abnormalities are identified. Lumbar spine: No acute fracture or subluxation. Multilevel degenerative disease with disc height loss and prominent anterior osteophytes. Multilevel facet joint arthropathy. IMPRESSION: 1. No acute fracture of the thoracic or lumbar spine. Vertebral body heights are maintained. 2. 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 Electronically Signed   By: Keane Police D.O.   On: 08/01/2022 17:39   CT Head Wo Contrast  Result Date: 08/01/2022 CLINICAL DATA:  Mental status change, unknown cause; Facial trauma, blunt EXAM: CT HEAD WITHOUT CONTRAST CT MAXILLOFACIAL WITHOUT CONTRAST TECHNIQUE: Multidetector CT  imaging of the head and maxillofacial structures were performed using the standard protocol without intravenous contrast. Multiplanar CT image reconstructions of the maxillofacial structures were also generated. RADIATION DOSE REDUCTION: This exam was performed according to the departmental dose-optimization program which includes automated exposure control, adjustment of the mA and/or kV according to patient size and/or use of iterative reconstruction technique. COMPARISON:  CT head and face 07/24/2022 FINDINGS: CT HEAD FINDINGS Brain: No acute intracranial hemorrhage, mass effect, or edema. No new loss of gray-white differentiation. Stable findings of probable chronic microvascular ischemic changes with superimposed chronic small vessel infarcts in the cerebral white matter. Ventricles are stable in size. No extra-axial collection. Vascular: No new finding. Skull: Unremarkable. Other: Mastoid air cells are clear. CT MAXILLOFACIAL FINDINGS Osseous: Redemonstrated facial fractures.  No new finding. Orbits: Increased density within bilateral globes reflecting retinal detachment therapy. No new abnormality. Sinuses: Mild mucosal thickening. Right maxillary sinus retention cyst or polyp. Soft tissues: No new finding.  Decreased soft tissue swelling. IMPRESSION: No acute intracranial abnormality. Decreased soft tissue swelling. Fractures as seen previously. Electronically Signed   By: Macy Mis M.D.   On: 08/01/2022 16:30   CT Maxillofacial Wo Contrast  Result Date: 08/01/2022 CLINICAL DATA:  Mental status change, unknown cause; Facial trauma, blunt EXAM: CT HEAD WITHOUT CONTRAST CT MAXILLOFACIAL WITHOUT CONTRAST TECHNIQUE: Multidetector CT imaging of the head and maxillofacial structures were performed using the standard protocol without intravenous contrast. Multiplanar CT image reconstructions of the maxillofacial structures were also generated. RADIATION DOSE REDUCTION: This exam was performed according to  the departmental dose-optimization program which includes automated exposure control,  adjustment of the mA and/or kV according to patient size and/or use of iterative reconstruction technique. COMPARISON:  CT head and face 07/24/2022 FINDINGS: CT HEAD FINDINGS Brain: No acute intracranial hemorrhage, mass effect, or edema. No new loss of gray-white differentiation. Stable findings of probable chronic microvascular ischemic changes with superimposed chronic small vessel infarcts in the cerebral white matter. Ventricles are stable in size. No extra-axial collection. Vascular: No new finding. Skull: Unremarkable. Other: Mastoid air cells are clear. CT MAXILLOFACIAL FINDINGS Osseous: Redemonstrated facial fractures.  No new finding. Orbits: Increased density within bilateral globes reflecting retinal detachment therapy. No new abnormality. Sinuses: Mild mucosal thickening. Right maxillary sinus retention cyst or polyp. Soft tissues: No new finding.  Decreased soft tissue swelling. IMPRESSION: No acute intracranial abnormality. Decreased soft tissue swelling. Fractures as seen previously. Electronically Signed   By: Macy Mis M.D.   On: 08/01/2022 16:30    SIGNED: Deatra James, MD, FHM. [facility],  Pager (please use [email or web address] to page/text) Please use Epic Secure Chat for non-urgent communication (7AM-7PM)  If 7PM-7AM, please contact night-coverage [email or web address], 08/02/2022, 2:10 PM

## 2022-08-03 ENCOUNTER — Other Ambulatory Visit: Payer: Self-pay

## 2022-08-03 DIAGNOSIS — W19XXXD Unspecified fall, subsequent encounter: Secondary | ICD-10-CM | POA: Diagnosis not present

## 2022-08-03 DIAGNOSIS — I10 Essential (primary) hypertension: Secondary | ICD-10-CM | POA: Diagnosis not present

## 2022-08-03 DIAGNOSIS — N179 Acute kidney failure, unspecified: Secondary | ICD-10-CM | POA: Diagnosis not present

## 2022-08-03 DIAGNOSIS — I5042 Chronic combined systolic (congestive) and diastolic (congestive) heart failure: Secondary | ICD-10-CM | POA: Diagnosis not present

## 2022-08-03 DIAGNOSIS — N184 Chronic kidney disease, stage 4 (severe): Secondary | ICD-10-CM

## 2022-08-03 LAB — CBC
HCT: 33.1 % — ABNORMAL LOW (ref 39.0–52.0)
Hemoglobin: 10.2 g/dL — ABNORMAL LOW (ref 13.0–17.0)
MCH: 25.6 pg — ABNORMAL LOW (ref 26.0–34.0)
MCHC: 30.8 g/dL (ref 30.0–36.0)
MCV: 83 fL (ref 80.0–100.0)
Platelets: 243 10*3/uL (ref 150–400)
RBC: 3.99 MIL/uL — ABNORMAL LOW (ref 4.22–5.81)
RDW: 14.8 % (ref 11.5–15.5)
WBC: 5.1 10*3/uL (ref 4.0–10.5)
nRBC: 0 % (ref 0.0–0.2)

## 2022-08-03 LAB — BASIC METABOLIC PANEL
Anion gap: 3 — ABNORMAL LOW (ref 5–15)
BUN: 45 mg/dL — ABNORMAL HIGH (ref 6–20)
CO2: 20 mmol/L — ABNORMAL LOW (ref 22–32)
Calcium: 8.8 mg/dL — ABNORMAL LOW (ref 8.9–10.3)
Chloride: 115 mmol/L — ABNORMAL HIGH (ref 98–111)
Creatinine, Ser: 2.33 mg/dL — ABNORMAL HIGH (ref 0.61–1.24)
GFR, Estimated: 32 mL/min — ABNORMAL LOW (ref >60)
Glucose, Bld: 133 mg/dL — ABNORMAL HIGH (ref 70–99)
Potassium: 4.5 mmol/L (ref 3.5–5.1)
Sodium: 138 mmol/L (ref 135–145)

## 2022-08-03 LAB — GLUCOSE, CAPILLARY
Glucose-Capillary: 144 mg/dL — ABNORMAL HIGH (ref 70–99)
Glucose-Capillary: 146 mg/dL — ABNORMAL HIGH (ref 70–99)

## 2022-08-03 MED ORDER — LIVING BETTER WITH HEART FAILURE BOOK: Freq: Once | Status: DC

## 2022-08-03 MED ORDER — ALUM & MAG HYDROXIDE-SIMETH 200-200-20 MG/5ML PO SUSP
30.0000 mL | ORAL | Status: DC | PRN
  Administered 2022-08-03: 30 mL via ORAL
  Filled 2022-08-03: qty 30

## 2022-08-03 MED ORDER — HYDRALAZINE HCL 50 MG PO TABS
50.0000 mg | ORAL_TABLET | Freq: Three times a day (TID) | ORAL | 3 refills | Status: AC
Start: 2022-08-03 — End: ?
  Filled 2022-08-03: qty 270, fill #0
  Filled 2022-10-17: qty 270, 90d supply, fill #0
  Filled 2023-02-15: qty 270, 90d supply, fill #1
  Filled 2023-05-15: qty 270, 90d supply, fill #2

## 2022-08-03 NOTE — TOC Transition Note (Signed)
Transition of Care [facility]) - CM/SW Discharge Note   Patient Details  Name: Jeffrey [name] MRN: [id number] Date of Birth: 1966-02-20  Transition [facility] Care [facility] CM/SW Contact:  Boneta Lucks, RN Phone Number: 08/03/2022, 3:40 PM   Clinical Narrative:   Patient admitted with acute kidney injury. Patient has a high risk for readmission. Patient is now living with his mother. He has been with her girlfriend for 11 years. She will assist and he may move back in with her. Family needs assistance and is working on a plan. [facility] for RN/PT/SW. TOC delivered a CAP aid application to his mother at the bedside.    Final next level of care: [place] Barriers to Discharge: Barriers Resolved   Patient Goals and CMS Choice Patient states their goals for this hospitalization and ongoing recovery are:: to go home. CMS Medicare.gov Compare Post Acute Care list provided to:: Patient Represenative (must comment) Choice offered to / list presented to : Parent  Discharge Placement            Name of family member notified: Mother Patient and family notified of of transfer: 08/03/22         Readmission Risk Interventions    08/03/2022    3:39 PM 03/17/2022    9:45 AM  Readmission Risk Prevention Plan  Transportation Screening Complete Complete  PCP or Specialist Appt within 3-5 Days Not Complete Complete  HRI or Home Care Consult Complete Complete  Social Work Consult for [place] Planning/Counseling Complete Complete  Palliative Care Screening Not Applicable Not Applicable  Medication Review Press photographer) Complete Complete

## 2022-08-03 NOTE — Progress Notes (Signed)
Patient ID: Jeffrey [name], male   DOB: 02-04-1966, 56 y.o.   MRN: [id number] S: No new complaints. O:BP (!) 152/67   Pulse 83   Temp 98.6 F (37 C) (Oral)   Resp 18   Ht 5\' 7"  (1.702 m)   Wt 93.7 kg   SpO2 100%   BMI 32.35 kg/m   Intake/Output Summary (Last 24 hours) at 08/03/2022 1125 Last data filed at 08/03/2022 0900 Gross per 24 hour  Intake 1599.89 ml  Output 1400 ml  Net 199.89 ml   Intake/Output: I/O last 3 completed shifts: In: 1974.4 [P.O.:600; I.V.:1374.4] Out: 1400 [Urine:1400]  Intake/Output this shift:  Total I/O In: 240 [P.O.:240] Out: -  Weight change:  Gen: NAD, lethargic but oriented X 3 CVS:RRR Resp: CTA Abd: +BS, soft, NT/ND Ext: no edema  Recent Labs  Lab 08/01/22 1615 08/02/22 0426 08/03/22 0501  NA 135 137 138  K 5.9* 5.0 4.5  CL 110 115* 115*  CO2 19* 19* 20*  GLUCOSE 178* 118* 133*  BUN 67* 60* 45*  CREATININE 4.16* 3.28* 2.33*  CALCIUM 9.1 8.6* 8.8*   Liver Function Tests: No results for input(s): "AST", "ALT", "ALKPHOS", "BILITOT", "PROT", "ALBUMIN" in the last 168 hours. No results for input(s): "LIPASE", "AMYLASE" in the last 168 hours. No results for input(s): "AMMONIA" in the last 168 hours. CBC: Recent Labs  Lab 08/01/22 1615 08/02/22 0426 08/03/22 0501  WBC 6.2 5.1 5.1  NEUTROABS 4.3  --   --   HGB 10.6* 9.5* 10.2*  HCT 34.4* 31.0* 33.1*  MCV 82.9 83.8 83.0  PLT 264 245 243   Cardiac Enzymes: No results for input(s): "CKTOTAL", "CKMB", "CKMBINDEX", "TROPONINI" in the last 168 hours. CBG: Recent Labs  Lab 08/02/22 0724 08/02/22 1124 08/02/22 1615 08/02/22 2152 08/03/22 0753  GLUCAP 143* 164* 126* 146* 144*    Iron Studies: No results for input(s): "IRON", "TIBC", "TRANSFERRIN", "FERRITIN" in the last 72 hours. Studies/Results: DG CHEST PORT 1 VIEW  Result Date: 08/01/2022 CLINICAL DATA:  Fall EXAM: PORTABLE CHEST 1 VIEW COMPARISON:  07/24/2022 FINDINGS: Left AICD remains in place, unchanged. Right  stimulator battery pack noted, unchanged. Heart and mediastinal contours are within normal limits. No focal opacities or effusions. No acute bony abnormality. IMPRESSION: No active cardiopulmonary disease. Electronically Signed   By: Rolm Baptise M.D.   On: 08/01/2022 20:13   DG Thoracic Spine 2 View  Result Date: 08/01/2022 CLINICAL DATA:  Pain after fall. EXAM: THORACIC SPINE 2 VIEWS; LUMBAR SPINE - COMPLETE 4+ VIEW COMPARISON:  None Available. FINDINGS: Thoracic spine: There is no evidence of thoracic spine fracture. Alignment is normal. No other significant bone abnormalities are identified. Lumbar spine: No acute fracture or subluxation. Multilevel degenerative disease with disc height loss and prominent anterior osteophytes. Multilevel facet joint arthropathy. IMPRESSION: 1. No acute fracture of the thoracic or lumbar spine. Vertebral body heights are maintained. 2. 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 Electronically Signed   By: Keane Police D.O.   On: 08/01/2022 17:39   DG Lumbar Spine Complete  Result Date: 08/01/2022 CLINICAL DATA:  Pain after fall. EXAM: THORACIC SPINE 2 VIEWS; LUMBAR SPINE - COMPLETE 4+ VIEW COMPARISON:  None Available. FINDINGS: Thoracic spine: There is no evidence of thoracic spine fracture. Alignment is normal. No other significant bone abnormalities are identified. Lumbar spine: No acute fracture or subluxation. Multilevel degenerative disease with disc height loss and  prominent anterior osteophytes. Multilevel facet joint arthropathy. IMPRESSION: 1. No acute fracture of the thoracic or lumbar spine. Vertebral body heights are maintained. 2. 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 Electronically Signed   By: Keane Police D.O.   On: 08/01/2022 17:39    CT Head Wo Contrast  Result Date: 08/01/2022 CLINICAL DATA:  Mental status change, unknown cause; Facial trauma, blunt EXAM: CT HEAD WITHOUT CONTRAST CT MAXILLOFACIAL WITHOUT CONTRAST TECHNIQUE: Multidetector CT imaging of the head and maxillofacial structures were performed using the standard protocol without intravenous contrast. Multiplanar CT image reconstructions of the maxillofacial structures were also generated. RADIATION DOSE REDUCTION: This exam was performed according to the departmental dose-optimization program which includes automated exposure control, adjustment of the mA and/or kV according to patient size and/or use of iterative reconstruction technique. COMPARISON:  CT head and face 07/24/2022 FINDINGS: CT HEAD FINDINGS Brain: No acute intracranial hemorrhage, mass effect, or edema. No new loss of gray-white differentiation. Stable findings of probable chronic microvascular ischemic changes with superimposed chronic small vessel infarcts in the cerebral white matter. Ventricles are stable in size. No extra-axial collection. Vascular: No new finding. Skull: Unremarkable. Other: Mastoid air cells are clear. CT MAXILLOFACIAL FINDINGS Osseous: Redemonstrated facial fractures.  No new finding. Orbits: Increased density within bilateral globes reflecting retinal detachment therapy. No new abnormality. Sinuses: Mild mucosal thickening. Right maxillary sinus retention cyst or polyp. Soft tissues: No new finding.  Decreased soft tissue swelling. IMPRESSION: No acute intracranial abnormality. Decreased soft tissue swelling. Fractures as seen previously. Electronically Signed   By: Macy Mis M.D.   On: 08/01/2022 16:30   CT Maxillofacial Wo Contrast  Result Date: 08/01/2022 CLINICAL DATA:  Mental status change, unknown cause; Facial trauma, blunt EXAM: CT HEAD WITHOUT CONTRAST CT MAXILLOFACIAL WITHOUT CONTRAST TECHNIQUE: Multidetector CT imaging of the head and maxillofacial structures were  performed using the standard protocol without intravenous contrast. Multiplanar CT image reconstructions of the maxillofacial structures were also generated. RADIATION DOSE REDUCTION: This exam was performed according to the departmental dose-optimization program which includes automated exposure control, adjustment of the mA and/or kV according to patient size and/or use of iterative reconstruction technique. COMPARISON:  CT head and face 07/24/2022 FINDINGS: CT HEAD FINDINGS Brain: No acute intracranial hemorrhage, mass effect, or edema. No new loss of gray-white differentiation. Stable findings of probable chronic microvascular ischemic changes with superimposed chronic small vessel infarcts in the cerebral white matter. Ventricles are stable in size. No extra-axial collection. Vascular: No new finding. Skull: Unremarkable. Other: Mastoid air cells are clear. CT MAXILLOFACIAL FINDINGS Osseous: Redemonstrated facial fractures.  No new finding. Orbits: Increased density within bilateral globes reflecting retinal detachment therapy. No new abnormality. Sinuses: Mild mucosal thickening. Right maxillary sinus retention cyst or polyp. Soft tissues: No new finding.  Decreased soft tissue swelling. IMPRESSION: No acute intracranial abnormality. Decreased soft tissue swelling. Fractures as seen previously. Electronically Signed   By: Macy Mis M.D.   On: 08/01/2022 16:30    carvedilol  12.5 mg Oral BID WC   heparin  5,000 Units Subcutaneous Q8H   insulin aspart  0-5 Units Subcutaneous QHS   insulin aspart  0-9 Units Subcutaneous TID WC   insulin glargine-yfgn  8 Units Subcutaneous Daily   isosorbide mononitrate  30 mg Oral Daily   lidocaine  1 patch Transdermal Q24H   tamsulosin  0.4 mg Oral QPC supper    BMET  Component Value Date/Time   NA 138 08/03/2022 0501   NA 144 03/09/2022 1639   K 4.5 08/03/2022 0501   CL 115 (H) 08/03/2022 0501   CO2 20 (L) 08/03/2022 0501   GLUCOSE 133 (H) 08/03/2022  0501   BUN 45 (H) 08/03/2022 0501   BUN 28 (H) 03/09/2022 1639   CREATININE 2.33 (H) 08/03/2022 0501   CREATININE 0.91 02/15/2017 1058   CALCIUM 8.8 (L) 08/03/2022 0501   GFRNONAA 32 (L) 08/03/2022 0501   GFRNONAA >89 02/15/2017 1058   GFRAA 67 01/27/2021 1227   GFRAA >89 02/15/2017 1058   CBC    Component Value Date/Time   WBC 5.1 08/03/2022 0501   RBC 3.99 (L) 08/03/2022 0501   HGB 10.2 (L) 08/03/2022 0501   HCT 33.1 (L) 08/03/2022 0501   PLT 243 08/03/2022 0501   MCV 83.0 08/03/2022 0501   MCH 25.6 (L) 08/03/2022 0501   MCHC 30.8 08/03/2022 0501   RDW 14.8 08/03/2022 0501   LYMPHSABS 1.3 08/01/2022 1615   MONOABS 0.4 08/01/2022 1615   EOSABS 0.1 08/01/2022 1615   BASOSABS 0.0 08/01/2022 1615   Assessment/Plan:  AKI/CKD stage IIIb-IV - presumably due to ischemic ATN in setting of decreased po intake with concomitant diuretics and ace inhibition.  Given bolus of IVF's overnight with improvement of BUN/Cr.  No indication for dialysis at this time.  Continue to hold lasix and lisinopril for now.   Avoid nephrotoxic medications including NSAIDs and iodinated intravenous contrast exposure unless the latter is absolutely indicated. Preferred narcotic agents for pain control are hydromorphone, fentanyl, and methadone. Morphine should not be used.  Avoid Baclofen and avoid oral sodium phosphate and magnesium citrate based laxatives / bowel preps.  Pt now back at baseline renal function.  Continue to hold lasix and lisinopril.  He has follow up with Dr. Royce Macadamia in our office on 08/05/22.  Nothing further to add.  Will sign off.  Please call with questions or concerns.  Hyperkalemia - due to #1 - improved with IVF's and holding ACE. Acute metabolic encephalopathy - lethargic but oriented today.  CT scan negative.  Agree with holding gabapentin in light of AKI and accumulation.  Falls - per family, frequent falls at home.  Pt is visually impaired.  Will likely need PT/OT  evaluation. Chronic combined systolic and diastolic CHF - appears euvolemic today.  Continue to hold lasix and lisinopril for now.  DM type 2 - per primary Sz disorder - consider EEG HTN - stable.  Continue to hold lisinopril and lasix for now.   Donetta Potts, MD [facility]
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
## 2022-08-03 NOTE — Assessment & Plan Note (Addendum)
Due to volume depletion in setting of ACEi use Baseline creatinine 2.6-3.0 Serum creatinine peaked 4.16 Improved with IVF Appreciate renal consult>>pt has follow up with Dr. Royce Macadamia, [facility] on 08/05/22 Will not restart lasix or lisinopril Serum creatinin 2.33 at time of d/c

## 2022-08-03 NOTE — Evaluation (Signed)
Physical Therapy Evaluation Patient Details Name: Jeffrey [name] MRN: [id number] DOB: 07-Jun-1966 Today's Date: 08/03/2022  History of Present Illness  Jeffrey [name] is a 56 y.o. male with medical history significant for chronic systolic congestive heart failure, AICD, diabetes mellitus, kidney stones, hypertension, coronary artery.  Patient was brought to the ED reports of a fall this morning.  At the time of my evaluation, patient appears a bit lethargic, but able to answer questions appropriately.  Mother and girlfriend at bedside.  Patient has been staying with his mom over the past week, prior to this, he stayed his girlfriend.  Mother reports over the past week he has fallen about 4 times.  His last fall was this morning in the bathroom.  He has a history of seizures but did doubt he had a seizure.  When he fell his mother heard him and immediately went to see him.  He was awake, did not lose consciousness.  At baseline he has a tendency to fall, as he has vision problems and gait problems.  Reports difficulty breathing over the past week that is intermittent, and cough.  No fevers no chills.  He was started on Ozempic about a month ago, and since then his appetite has reduced.  No vomiting no loose stools.  He has also been taking Lasix 40 mg twice every day.  For the past few days, he has been sleeping more.  And has just not been himself.  Today he has been reporting back pain after the fall, otherwise denies any other pain.  Does not drink alcohol or use drugs.   Clinical Impression  Patient functioning near baseline for functional mobility and gait demonstrating fair carryover for using SPC, safer using RW and able to ambulate in room and hallways without loss of balance using RW.  Patient requires Min guard/Min assist mostly due to poor eyesight.  Patient encouraged to ask for assistance when needing to get up at hospital and when return home.  PLAN:  Patient to be discharged home today and  discharged from acute physical therapy to care of nursing for ambulation as tolerated for length of stay with recommendations stated below         Recommendations for follow up therapy are one component of a multi-disciplinary discharge planning process, led by the attending physician.  Recommendations may be updated based on patient status, additional functional criteria and insurance authorization.  Follow Up Recommendations Home health PT      Assistance Recommended at Discharge Set up Supervision/Assistance  Patient can return home with the following  A little help with walking and/or transfers;A little help with bathing/dressing/bathroom;Assistance with cooking/housework;Help with stairs or ramp for entrance    Equipment Recommendations None recommended by PT  Recommendations for Other Services       Functional Status Assessment Patient has had a recent decline in their functional status and/or demonstrates limited ability to make significant improvements in function in a reasonable and predictable amount of time     Precautions / Restrictions Precautions Precautions: Fall Restrictions Weight Bearing Restrictions: No      Mobility  Bed Mobility Overal bed mobility: Needs Assistance Bed Mobility: Supine to Sit     Supine to sit: Min guard     General bed mobility comments: increased time, labored movement    Transfers Overall transfer level: Needs assistance Equipment used: Rolling walker (2 wheels), Straight cane Transfers: Sit to/from Stand, Bed to chair/wheelchair/BSC Sit to Stand: Min guard, Min assist  Step pivot transfers: Min guard       General transfer comment: slow labored movement    Ambulation/Gait Ambulation/Gait assistance: Min guard Gait Distance (Feet): 75 Feet Assistive device: Rolling walker (2 wheels), Straight cane Gait Pattern/deviations: Decreased step length - right, Decreased step length - left, Decreased stride length, Trunk  flexed Gait velocity: decreased     General Gait Details: labored unsteady movement using SPC, safer using RW demonstrating fair/good return for ambulation in room and hallway without loss of balance  Stairs            Wheelchair Mobility    Modified Rankin (Stroke Patients Only)       Balance Overall balance assessment: Needs assistance Sitting-balance support: Feet supported, No upper extremity supported Sitting balance-Leahy Scale: Fair Sitting balance - Comments: fair/good seated at EOB   Standing balance support: During functional activity, Single extremity supported Standing balance-Leahy Scale: Fair Standing balance comment: fair/poor using SPC, fair/good using RW                             Pertinent Vitals/Pain Pain Assessment Pain Assessment: Faces Faces Pain Scale: Hurts little more Pain Location: bacl Pain Descriptors / Indicators: Sore, Discomfort Pain Intervention(s): Limited activity within patient's tolerance, Monitored during session, Repositioned    Home Living Family/patient expects to be discharged to:: Private residence Living Arrangements: Parent Available Help at Discharge: Family;Available PRN/intermittently Type of Home: House Home Access: Stairs to enter Entrance Stairs-Rails: Right Entrance Stairs-Number of Steps: 3   Home Layout: One level Home Equipment: Cane - single point;Rolling Walker (2 wheels)      Prior Function Prior Level of Function : History of Falls (last six months);Needs assist       Physical Assist : Mobility (physical);ADLs (physical)     Mobility Comments: household ambulator using SPC ADLs Comments: assisted by family     Hand Dominance   Dominant Hand: Left    Extremity/Trunk Assessment   Upper Extremity Assessment Upper Extremity Assessment: Overall WFL for tasks assessed    Lower Extremity Assessment Lower Extremity Assessment: Generalized weakness    Cervical / Trunk  Assessment Cervical / Trunk Assessment: Kyphotic  Communication   Communication: No difficulties  Cognition Arousal/Alertness: Awake/alert Behavior During Therapy: WFL for tasks assessed/performed Overall Cognitive Status: Within Functional Limits for tasks assessed                                          General Comments      Exercises     Assessment/Plan    PT Assessment All further PT needs can be met in the next venue of care  PT Problem List Decreased strength;Decreased activity tolerance;Decreased balance;Decreased mobility       PT Treatment Interventions      PT Goals (Current goals can be found in the Care Plan section)  Acute Rehab PT Goals Patient Stated Goal: return home with family to assist PT Goal Formulation: With patient/family Time For Goal Achievement: 08/03/22 Potential to Achieve Goals: Good    Frequency       Co-evaluation               AM-PAC PT "6 Clicks" Mobility  Outcome Measure Help needed turning from your back to your side while in a flat bed without using bedrails?: A Little Help needed moving from   lying on your back to sitting on the side of a flat bed without using bedrails?: A Little Help needed moving to and from a bed to a chair (including a wheelchair)?: A Little Help needed standing up from a chair using your arms (e.g., wheelchair or bedside chair)?: A Little Help needed to walk in hospital room?: A Little Help needed climbing 3-5 steps with a railing? : A Lot 6 Click Score: 17    End of Session   Activity Tolerance: Patient tolerated treatment well;Patient limited by fatigue Patient left: in chair;with call bell/phone within reach Nurse Communication: Mobility status PT Visit Diagnosis: Unsteadiness on feet (R26.81);Other abnormalities of gait and mobility (R26.89);Muscle weakness (generalized) (M62.81)    Time: 1450-1515 PT Time Calculation (min) (ACUTE ONLY): 25 min   Charges:   PT  Evaluation $PT Eval Moderate Complexity: 1 Mod PT Treatments $Therapeutic Activity: 23-37 mins        4:47 PM, 08/03/22 James Watkins, MPT Physical Therapist with [facility] [phone] office [phone] mobile phone   

## 2022-08-03 NOTE — Assessment & Plan Note (Signed)
Follow up with Dr. Caryl Comes

## 2022-08-03 NOTE — TOC Progression Note (Signed)
  Transition of Care [facility]) Screening Note   Patient Details  Name: Jeffrey [name] Date of Birth: 26-Feb-1966   Transition of Care [facility]) CM/SW Contact:    Boneta Lucks, RN Phone Number: 08/03/2022, 3:03 PM  PT eval pending  Transition of Care Department (TOC) has reviewed patient and no TOC needs have been identified at this time. We will continue to monitor patient advancement through interdisciplinary progression rounds. If new patient transition needs arise, please place a TOC consult.      Barriers to Discharge: Continued Medical Work up  Expected Discharge Plan and Services

## 2022-08-03 NOTE — Op Note (Signed)
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 Retrobulbar anesthesia was obtained in the left eye without complication  The  patient was prepped and draped in the usual fashion for ocular surgery on the  left eye .  A lid speculum was placed.  Infusion line and trocar was placed at the 4 o'clock position approximately 3.5 mm from the surgical limbus.   The infusion line was allowed to run and then clamped when placed at the cannula opening. The line was inserted and secured to the drape with an adhesive strip.   Active trocars/cannula were placed at the 10 and 2 o'clock positions approximately 3.5 mm from the surgical limbus. The cannula was visualized in the vitreous cavity.  The light pipe and vitreous cutter were inserted into the vitreous cavity and a core vitrectomy was performed.  Care taken to remove the vitreous up to the vitreous base for 360 degrees.   Attention was directed toward relieving the tractional detachment from the posterior pole in particular around the arcades due to the degree of hemorrhage and traction present.  This was done carefully at the disc and surrounding arcades. There notable neovascular fronds with traction and associated hemorrhage were very adherant with gliosis.  Careful segmentation and delamination of these membranes was performed.  Care was taken to elevate the membranes and remove them both with a vitrector and a  light pipe with dissection.  A Chandelier light source was used to aid in visualization and allow careful bimanual delamination. Hemostasis of the neovascular fronds was performed with endocautery and endolaser. Following hemostasis, continued dissection of membranes and removal of membranes was performed including the nasal and inferior retina. Endolaser was applied to the areas where the neovascular fronds were still present.  3 rows of endolaser were applied 360 degrees to the periphery.  A complete air-fluid exchange was then performed and additional endolaser was applied. 9093 centistoke silicone oil was injected into the eye. The trocars were sequentially removed and all were noted to be sealed Subconjunctival injections of antibiotic and Decadron were placed.   The speculum and drapes were removed and the eye was patched with Polymixin/Bacitracin ophthalmic ointment. An eye shield was placed and the patient was transferred alert and conversant with stable vital signs to the post operative recovery area.  The patient tolerated the procedure well and no complications were noted.   Royston Cowper MD

## 2022-08-04 NOTE — Progress Notes (Signed)
Remote ICD transmission.   

## 2022-08-18 ENCOUNTER — Other Ambulatory Visit: Payer: Self-pay

## 2022-08-18 MED ORDER — DORZOLAMIDE HCL-TIMOLOL MAL 2-0.5 % OP SOLN
1.0000 [drp] | Freq: Two times a day (BID) | OPHTHALMIC | 2 refills | Status: DC
  Filled 2022-08-18: qty 10, 100d supply, fill #0

## 2022-08-18 MED ORDER — BRIMONIDINE TARTRATE 0.1 % OP SOLN
1.0000 [drp] | Freq: Two times a day (BID) | OPHTHALMIC | 2 refills | Status: AC
  Filled 2022-08-18: qty 5, 50d supply, fill #0

## 2022-08-19 ENCOUNTER — Other Ambulatory Visit: Payer: Self-pay

## 2022-08-19 MED ORDER — GLUCOSE BLOOD VI STRP
ORAL_STRIP | 3 refills | Status: AC
  Filled 2022-08-19: qty 200, 40d supply, fill #0

## 2022-08-19 MED ORDER — ACCU-CHEK SOFTCLIX LANCETS MISC
3 refills | Status: AC
  Filled 2022-08-19: qty 100, 90d supply, fill #0

## 2022-08-19 MED ORDER — ACCU-CHEK GUIDE W/DEVICE KIT
PACK | 0 refills | Status: AC
  Filled 2022-08-19: qty 1, 30d supply, fill #0

## 2022-08-22 ENCOUNTER — Other Ambulatory Visit: Payer: Self-pay

## 2022-08-22 MED ORDER — LACTULOSE 10 GM/15ML PO SOLN
20.0000 g | Freq: Every day | ORAL | 3 refills | Status: DC
  Filled 2022-08-22: qty 1800, 60d supply, fill #0

## 2022-09-19 ENCOUNTER — Other Ambulatory Visit: Payer: Self-pay

## 2022-09-20 ENCOUNTER — Other Ambulatory Visit: Payer: Self-pay

## 2022-09-20 MED ORDER — BD PEN NEEDLE MINI U/F 31G X 5 MM MISC
1.0000 | Freq: Every day | 3 refills | Status: AC
Start: 2022-09-20 — End: ?
  Filled 2022-09-20: qty 100, 100d supply, fill #0

## 2022-09-23 ENCOUNTER — Other Ambulatory Visit: Payer: Self-pay

## 2022-10-09 NOTE — Progress Notes (Signed)
Device order sheet faxed to [facility]. Email sent to [employer] reps., Lindsi forte, and [place] of surgery date, and time.

## 2022-10-10 ENCOUNTER — Encounter (HOSPITAL_COMMUNITY): Payer: Self-pay | Admitting: Physician Assistant

## 2022-10-10 ENCOUNTER — Encounter: Payer: Self-pay | Admitting: Internal Medicine

## 2022-10-10 ENCOUNTER — Encounter (HOSPITAL_COMMUNITY): Payer: Self-pay | Admitting: Ophthalmology

## 2022-10-10 NOTE — Progress Notes (Signed)
Patient takes Semaglutide weekly on Sunday.  Last dose was on 10/09/22.

## 2022-10-10 NOTE — Progress Notes (Signed)
Anesthesia Chart Review: SAME DAY WORK-UP  Case: [id number] Date/Time: 10/13/22 1215   Procedure: PARS PLANA VITRECTOMY 25 GAUGE FOR ENDOPHTHALMITIS WITH ENDO LASER TANRETINAL PHOTOCOAGULATION (Left)   Anesthesia type: Monitor Anesthesia Care   Pre-op diagnosis:      COMPLICATIONS OF OTHER INTERNAL PROSTHETIC DEVICES, IMPLANTS AND GRAFTS      OTHER RETINAL DETACHMENT   Location: [place] OR ROOM 08 / [place] [facility]   Surgeons: Jalene Mullet, MD       DISCUSSION:  Patient is a 56 year old male scheduled for the above procedure.    History includes never smoker, DM2, HTN, HLD, CAD (non-obstructive, 3235), chronic systolic CHF (diagnosed 5732; Barostim Neo device implant 11/26/21), nonischemic cardiomyopathy, ICD (Medtronic [id number] Visia AF MRI VR ICD 01/15/18), dyspnea, CKD (stage 3), anemia, diabetic foot ulcer (s/p right great and 2nd toe amputations 2017), eye surgery (right para plan vitrectomy 07/14/20; repair hemorrhagic detached right retina 08/25/20, left para plan vitrectomy 07/05/22), back surgery, COVID-19 (12/03/2019 & 12/21/20). He had had recurrent falls in setting of poor vision (last admission 07/2022) and hypoglycemia (episodes improved by 06/28/22 endocrinology follow-up).        Last cardiology visit with Dr. Acie Fredrickson was on 04/12/22 and EP follow-up with Dr. Caryl Comes on 03/09/22. S/p Barostim implant on 11/26/21. Follow-up echo on 03/31/22 showed EF improved to 50-55% (previously 30-35%). Counseled on decreasing salt intake. Diuretic management per nephrology. One year follow-up planned.   Perioperative ICD EP recommendations: Device Information: Clinic EP Physician:  Virl Axe, MD  Device Type:  Defibrillator Manufacturer and Phone #:  Medtronic: [phone] Pacemaker Dependent?:  No. Date of Last Device Check:  07/15/2022         Normal Device Function?:  Yes.     Electrophysiologist's Recommendations: Have magnet available. Provide continuous ECG monitoring when magnet is used or reprogramming is  to be performed.  Procedure should not interfere with device function.  No device programming or magnet placement needed.  He is a same day work-up. PAT RN contacted patient on 10/10/22. He reported last Semaglutide was on 10/09/22. Anesthesia team to evaluate on the day of surgery and discuss definitive anesthesia plan.    VS: Ht _0  (1.702 m)   Wt 79 kg   BMI 27.28 kg/m  BP Readings from Last 3 Encounters:  08/03/22 (!) 156/87  07/25/22 (!) 147/89  07/05/22 (!) 155/92   Pulse Readings from Last 3 Encounters:  08/03/22 73  07/25/22 98  07/05/22 74     PROVIDERS: Willene Hatchet, NP is PCP  Virl Axe, MD is EP cardiologist Mertie Moores, MD is primary cardiologist  Harrie Jeans, MD is nephrologist [name], MD is endocrinologist   LABS: For day of surgery as indicated. Last results in [facility] include: Lab Results  Component Value Date   WBC 5.1 08/03/2022   HGB 10.2 (L) 08/03/2022   HCT 33.1 (L) 08/03/2022   PLT 243 08/03/2022   GLUCOSE 133 (H) 08/03/2022   ALT 20 07/24/2022   AST 20 07/24/2022   NA 138 08/03/2022   K 4.5 08/03/2022   CL 115 (H) 08/03/2022   CREATININE 2.33 (H) 08/03/2022   BUN 45 (H) 08/03/2022   CO2 20 (L) 08/03/2022   TSH 0.831 03/11/2022   HGBA1C 6.8 (A) 06/28/2022     OTHER: EEG 07/01/21: IMPRESSION: This study is within normal limits. No seizures or epileptiform discharges were seen throughout the recording.     IMAGES: 1V PCXR 08/01/22: FINDINGS: Left AICD remains in place, unchanged.  Right stimulator battery pack noted, unchanged. Heart and mediastinal contours are within normal limits. No focal opacities or effusions. No acute bony abnormality. IMPRESSION: No active cardiopulmonary disease.  CT Head/Maxillofacial 08/01/22: IMPRESSION: No acute intracranial abnormality. Decreased soft tissue swelling. Fractures as seen previously.     EKG: 08/01/22: Sinus rhythm Borderline prolonged PR interval Nonspecific  intraventricular conduction delay Artifact in lead(s) I II aVR aVF V1 Confirmed by Gareth Morgan [phone]) on 08/05/2022 12:24:27 AM    CV: Echo 03/31/22: IMPRESSIONS   1. Left ventricular ejection fraction, by estimation, is 50 to 55%. The  left ventricle has low normal function. The left ventricle has no regional  wall motion abnormalities. There is severe concentric left ventricular  hypertrophy. Left ventricular  diastolic parameters are consistent with Grade I diastolic dysfunction  (impaired relaxation).   2. Right ventricular systolic function is normal. The right ventricular  size is mildly enlarged. There is normal pulmonary artery systolic  pressure. The estimated right ventricular systolic pressure is 35.4 mmHg.   3. The mitral valve is grossly normal. No evidence of mitral valve  regurgitation. No evidence of mitral stenosis.   4. The aortic valve is abnormal. Aortic valve regurgitation is not  visualized. Aortic valve sclerosis is present, with no evidence of aortic  valve stenosis.  - Comparison(s): LVEF has improved from prior.  (Comparison: LVEF 20-25% 01/29/17 echo; 10-15% 01/31/17 cath; LVEF 20% dffuse hypokinesis, grade 2 DD, RV systolic functio nmoderately-severely reduced 01/14/18 echo; LVEF 30-35%, normal RVSF 06/13/20 echo; LVEF 30-35%, grade 2 DD, normal RVSF 03/30/21 echo; 07/01/21 LVEF 30-35%, LV global hypokinesis, grade 1 DD, normal RVSF)     US Carotid 03/30/21: Summary:  Right Carotid: Velocities in the right ICA are consistent with a 1-39% stenosis.  Left Carotid: Velocities in the left ICA are consistent with a 1-39% stenosis.  Vertebrals:  Bilateral vertebral arteries demonstrate antegrade flow.  Subclavians: Normal flow hemodynamics were seen in bilateral subclavian arteries.      Cardiac cath 01/31/17: 1st RPLB lesion, 60 %stenosed. Dist RCA lesion, 60 %stenosed. Prox RCA lesion, 55 %stenosed. Prox LAD lesion, 10 %stenosed. There is severe left  ventricular systolic dysfunction. LV end diastolic pressure is mildly elevated. - Severe global LV dysfunction with an ejection fraction of 10-15%.  The pattern is one of a nonischemic cardiomyopathy. - Mild coronary obstructive disease with smooth 10% narrowing in the LAD; normal ramus intermediate, normal left circumflex; and RCA with 50-60% proximal stenosis and distal tapering of 60%.  Prior to giving rise to 3 small distal branches with 60% narrowing in a small inferior LV branch.  The patient's LV dysfunction is out of proportion to his CAD. - Initiation of medical therapy with carvedilol, spironolactone, and probable initiation of angiotension receptorblocker/neprilysin inhibition therapy.  Consider short-term life-vest prior to discharge to allow for possible medication induced improvement in LV function. [S/p ICD 01/15/18]   Past Medical History:  Diagnosis Date   AICD (automatic cardioverter/defibrillator) present    Medtronic   AICD (automatic cardioverter/defibrillator) present    MDT Visia AF MRI   Anemia    CAD (coronary artery disease)    a. cath 01/31/17: 60% 1st RPLB, 60% dist RCA, 55% prox RCA, 10% pro LAD --> Rx TX.    CHF (congestive heart failure) (HCC)    Chronic systolic CHF (congestive heart failure) ([place]) 01/28/2017   1. Echo 01/29/17:  EF 20-25, normal wall motion, mild LAE // 2. EF 10-15 by [facility] 01/2017    Diabetes  mellitus without complication ([place])    type 2   Diabetic foot infection ([place]) 03/2016   RT FOOT   Dyspnea    History of kidney stones    passed   HTN (hypertension)    Hyperlipidemia    Hypertension    Myocardial infarction [facility]), although reported, NICM at cath    NICM (nonischemic cardiomyopathy) ([place]) 02/15/2017   1. Mod non-obs CAD on LHC in 01/2017 - CAD does not explain cardiomyopathy   Renal disorder    stage 4    Past Surgical History:  Procedure Laterality Date   AIR/FLUID EXCHANGE Right 07/14/2020   Procedure: AIR/FLUID EXCHANGE;  Surgeon:  Jalene Mullet, MD;  Location: [place];  Service: Ophthalmology;  Laterality: Right;   AMPUTATION Right 04/01/2016   Procedure: Right Great Toe Amputation;  Surgeon: Newt Minion, MD;  Location: [place];  Service: Orthopedics;  Laterality: Right;   AMPUTATION Right 06/19/2016   Procedure: AMPUTATION SECOND TOE;  Surgeon: Marybelle Killings, MD;  Location: [place];  Service: Orthopedics;  Laterality: Right;   BACK SURGERY     for abscess   CARDIAC CATHETERIZATION     ICD IMPLANT N/A 01/15/2018   Procedure: ICD IMPLANT;  Surgeon: Deboraha Sprang, MD;  Location: [place] CV LAB;  Service: Cardiovascular;  Laterality: N/A;   INJECTION OF SILICONE OIL Right 03/18/6545   Procedure: INJECTION OF SILICONE OIL;  Surgeon: Jalene Mullet, MD;  Location: [place];  Service: Ophthalmology;  Laterality: Right;   INJECTION OF SILICONE OIL Right 04/13/5464   Procedure: INJECTION OF SILICONE OIL;  Surgeon: Jalene Mullet, MD;  Location: [place];  Service: Ophthalmology;  Laterality: Right;   INJECTION OF SILICONE OIL Left 6/81/2751   Procedure: INJECTION OF SILICONE OIL;  Surgeon: Jalene Mullet, MD;  Location: [place];  Service: Ophthalmology;  Laterality: Left;   INSERT / REPLACE / REMOVE PACEMAKER     MEMBRANE PEEL Right 08/25/2020   Procedure: MEMBRANE PEEL;  Surgeon: Jalene Mullet, MD;  Location: [place];  Service: Ophthalmology;  Laterality: Right;   MEMBRANE PEEL Left 07/05/2022   Procedure: MEMBRANECTOMY;  Surgeon: Jalene Mullet, MD;  Location: [place];  Service: Ophthalmology;  Laterality: Left;   PARS PLANA VITRECTOMY Right 07/14/2020   Procedure: PARS PLANA VITRECTOMY WITH 25 GAUGE, Membranetomy, drainage of subretinal fluid;  Surgeon: Jalene Mullet, MD;  Location: [place];  Service: Ophthalmology;  Laterality: Right;   PARS PLANA VITRECTOMY Right 08/25/2020   Procedure: PARS PLANA VITRECTOMY WITH 25 GAUGE;  Surgeon: Jalene Mullet, MD;  Location: [place];  Service: Ophthalmology;  Laterality: Right;   PARS PLANA  VITRECTOMY Left 07/05/2022   Procedure: LEFT PARS PLANA VITRECTOMY WITH 25 GAUGE;  Surgeon: Jalene Mullet, MD;  Location: [place];  Service: Ophthalmology;  Laterality: Left;   PHOTOCOAGULATION WITH LASER Right 07/14/2020   Procedure: PHOTOCOAGULATION WITH LASER;  Surgeon: Jalene Mullet, MD;  Location: [place];  Service: Ophthalmology;  Laterality: Right;   PHOTOCOAGULATION WITH LASER Right 08/25/2020   Procedure: PHOTOCOAGULATION WITH LASER;  Surgeon: Jalene Mullet, MD;  Location: [place];  Service: Ophthalmology;  Laterality: Right;   PHOTOCOAGULATION WITH LASER Left 07/05/2022   Procedure: PHOTOCOAGULATION WITH LASER;  Surgeon: Jalene Mullet, MD;  Location: [place];  Service: Ophthalmology;  Laterality: Left;   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   Procedure: REPAIR OF HEMORRHAGIC DETACHMENT;  Surgeon: Jalene Mullet, MD;  Location: [place];  Service: Ophthalmology;  Laterality: Right;   RIGHT/LEFT HEART CATH AND  CORONARY ANGIOGRAPHY N/A 01/31/2017   Procedure: Right/Left Heart Cath and Coronary Angiography;  Surgeon: Troy Sine, MD;  Location: [place] CV LAB;  Service: Cardiovascular;  Laterality: N/A;   SILICON OIL REMOVAL Right 7/57/3225   Procedure: SILICON OIL REMOVAL;  Surgeon: Jalene Mullet, MD;  Location: [place];  Service: Ophthalmology;  Laterality: Right;    MEDICATIONS: No current facility-administered medications for this encounter.    albuterol (VENTOLIN HFA) 108 (90 Base) MCG/ACT inhaler   brimonidine (ALPHAGAN P) 0.1 % SOLN   carvedilol (COREG) 12.5 MG tablet   cetirizine (ZYRTEC) 10 MG tablet   Cholecalciferol (D3-50) 1.25 MG (50000 UT) capsule   ferrous sulfate 325 (65 FE) MG tablet   furosemide (LASIX) 40 MG tablet   gabapentin (NEURONTIN) 300 MG capsule   hydrALAZINE (APRESOLINE) 50 MG tablet   Insulin Glargine Solostar (LANTUS) 100 UNIT/ML Solostar Pen   isosorbide mononitrate (IMDUR) 30 MG 24 hr tablet   ondansetron (ZOFRAN) 4 MG  tablet   Semaglutide,0.25 or 0.5MG/DOS, 2 MG/3ML SOPN   tamsulosin (FLOMAX) 0.4 MG CAPS capsule   Accu-Chek Softclix Lancets lancets   Accu-Chek Softclix Lancets lancets   Blood Glucose Monitoring Suppl (ACCU-CHEK GUIDE) w/Device KIT   Blood Glucose Monitoring Suppl (ACCU-CHEK GUIDE) w/Device KIT   Continuous Blood Gluc Receiver (FREESTYLE LIBRE 2 READER) DEVI   Continuous Blood Gluc Sensor (FREESTYLE LIBRE 2 SENSOR) MISC   dorzolamide-timolol (COSOPT) 22.3-6.8 MG/ML ophthalmic solution   Elastic Bandages & Supports (T.E.D. KNEE LENGTH/S-LONG) MISC   gabapentin (NEURONTIN) 300 MG capsule   glucose blood (ACCU-CHEK GUIDE) test strip   glucose blood test strip   Insulin Pen Needle (B-D UF III MINI PEN NEEDLES) 31G X 5 MM MISC   Insulin Pen Needle (BD PEN NEEDLE NANO U/F) 32G X 4 MM MISC   lactulose (CHRONULAC) 10 GM/15ML solution   prednisoLONE acetate (PRED FORTE) 1 % ophthalmic suspension    Myra Gianotti, PA-C Surgical Short Stay/Anesthesiology [facility] Phone [phone] [facility] Phone [phone] 10/10/2022 12:49 PM

## 2022-10-10 NOTE — Progress Notes (Addendum)
PCP - Cletis Athens, NP  Cardiologist - Dr Cathie Olden Electrophysiologist - Dr Caryl Comes  Chest x-ray - 08/01/22 (1V) EKG - 08/01/22 Stress Test - 10/04/11 CE ECHO - 03/31/22 Cardiac Cath - 01/31/17  Medtronic ICD - see 10/09/22 note from Cipriano Mile, RN.  Last remote device check was on 07/15/22.  Sleep Study -  n/a CPAP - none  Take 6-7 units of Lantus on the morning of surgery.  If your blood sugar is less than 70 mg/dL, you will need to treat for low blood sugar: Treat a low blood sugar (less than 70 mg/dL) with  cup of clear juice (cranberry or apple), 4 glucose tablets, OR glucose gel. Recheck blood sugar in 15 minutes after treatment (to make sure it is greater than 70 mg/dL). If your blood sugar is not greater than 70 mg/dL on recheck, call [phone] for further instructions.  ERAS: Clear liquids til 1:30 PM DOS.  Anesthesia review: Yes  STOP now taking any Aspirin (unless otherwise instructed by your surgeon), Aleve, Naproxen, Ibuprofen, Motrin, Advil, Goody's, BC's, all herbal medications, fish oil, and all vitamins.   Coronavirus Screening Does the patient have any of the following symptoms:  Cough yes/no: No Fever (>100.32F)  yes/no: No Runny nose yes/no: No Sore throat yes/no: No Difficulty breathing/shortness of breath  yes/no: No  Have you traveled in the last 14 days and where? yes/no: No  Spoke with patient's wife [name] who verbalized understanding of instructions that were given via phone.

## 2022-10-10 NOTE — Anesthesia Preprocedure Evaluation (Signed)
Anesthesia Evaluation    Airway        Dental   Pulmonary           Cardiovascular hypertension,   Echo 03/31/22: IMPRESSIONS  1. Left ventricular ejection fraction, by estimation, is 50 to 55%. The  left ventricle has low normal function. The left ventricle has no regional  wall motion abnormalities. There is severe concentric left ventricular  hypertrophy. Left ventricular  diastolic parameters are consistent with Grade I diastolic dysfunction  (impaired relaxation).  2. Right ventricular systolic function is normal. The right ventricular  size is mildly enlarged. There is normal pulmonary artery systolic  pressure. The estimated right ventricular systolic pressure is 48.2 mmHg.  3. The mitral valve is grossly normal. No evidence of mitral valve  regurgitation. No evidence of mitral stenosis.  4. The aortic valve is abnormal. Aortic valve regurgitation is not  visualized. Aortic valve sclerosis is present, with no evidence of aortic  valve stenosis.  - Comparison(s): LVEF has improved from prior.    Neuro/Psych    GI/Hepatic   Endo/Other  diabetes  Renal/GU      Musculoskeletal   Abdominal   Peds  Hematology   Anesthesia Other Findings   Reproductive/Obstetrics                             Anesthesia Physical Anesthesia Plan  ASA:   Anesthesia Plan:    Post-op Pain Management:    Induction:   PONV Risk Score and Plan:   Airway Management Planned:   Additional Equipment:   Intra-op Plan:   Post-operative Plan:   Informed Consent:   Plan Discussed with:   Anesthesia Plan Comments: (PAT note written 10/10/2022 by Myra Gianotti, PA-C. )        Anesthesia Quick Evaluation

## 2022-10-10 NOTE — Progress Notes (Signed)
PERIOPERATIVE PRESCRIPTION FOR IMPLANTED CARDIAC DEVICE PROGRAMMING  Patient Information: Name:  Jeffrey [name]  DOB:  10-Sep-1966  MRN:  [id number]   Planned Procedure:  Pars Plana Vitrectomy 25 gauge for endophthalmitis with endo laser,tanretional, photo coagulation left eye  Surgeon:  Dr. Sammie Bench  Date of Procedure:  10/11/22  Cautery will be used.  Position during surgery:  supine   Device Information:  Clinic EP Physician:  Virl Axe, MD   Device Type:  Defibrillator Manufacturer and Phone #:  Medtronic: [phone] Pacemaker Dependent?:  No. Date of Last Device Check:  07/15/2022 Normal Device Function?:  Yes.    Electrophysiologist's Recommendations:  Have magnet available. Provide continuous ECG monitoring when magnet is used or reprogramming is to be performed.  Procedure should not interfere with device function.  No device programming or magnet placement needed.  Per Device Clinic Standing Orders, Damian Leavell, RN  10:32 AM 10/10/2022

## 2022-10-13 ENCOUNTER — Ambulatory Visit (HOSPITAL_COMMUNITY): Admission: RE | Admit: 2022-10-13 | Payer: Medicare Other | Source: Home / Self Care | Admitting: Ophthalmology

## 2022-10-13 SURGERY — PARS PLANA VITRECTOMY 25 GAUGE FOR ENDOPHTHALMITIS
Anesthesia: Monitor Anesthesia Care | Laterality: Left

## 2022-10-14 ENCOUNTER — Ambulatory Visit (INDEPENDENT_AMBULATORY_CARE_PROVIDER_SITE_OTHER): Payer: Medicare Other

## 2022-10-14 DIAGNOSIS — I428 Other cardiomyopathies: Secondary | ICD-10-CM

## 2022-10-17 ENCOUNTER — Other Ambulatory Visit: Payer: Self-pay

## 2022-10-18 ENCOUNTER — Other Ambulatory Visit: Payer: Self-pay

## 2022-10-18 LAB — CUP PACEART REMOTE DEVICE CHECK
Battery Remaining Longevity: 71 mo
Battery Voltage: 2.98 V
Brady Statistic RV Percent Paced: 0.01 %
Date Time Interrogation Session: 20231106183609
HighPow Impedance: 55 Ohm
Implantable Lead Connection Status: 753985
Implantable Lead Implant Date: 20190204
Implantable Lead Location: 753860
Implantable Pulse Generator Implant Date: 20190204
Lead Channel Impedance Value: 285 Ohm
Lead Channel Impedance Value: 380 Ohm
Lead Channel Pacing Threshold Amplitude: 1 V
Lead Channel Pacing Threshold Pulse Width: 0.4 ms
Lead Channel Sensing Intrinsic Amplitude: 8 mV
Lead Channel Sensing Intrinsic Amplitude: 8 mV
Lead Channel Setting Pacing Amplitude: 2.5 V
Lead Channel Setting Pacing Pulse Width: 0.4 ms
Lead Channel Setting Sensing Sensitivity: 0.3 mV
Zone Setting Status: 755011
Zone Setting Status: 755011

## 2022-10-24 NOTE — Progress Notes (Signed)
Instructions Only!  PPM/ICD - ICD Device Orders - Yes in notes from 10/10/22 Rep Notified - Y Emailed on 10/23/22  ERAS Protcol - Clears until 1000  GLP-1: Last dose on 10/23/22  Anesthesia review: Reviewed on 10/10/22  Patient verbally denies any shortness of breath, fever, cough and chest pain during phone call   -------------  SDW INSTRUCTIONS given:  Your procedure is scheduled on 10/25/22.  Report to [facility] Main Entrance "A" at 1130 A.M., and check in at the Admitting office.  Call this number if you have problems the morning of surgery:  [phone]   Remember:  Do not eat after midnight the night before your surgery  You may drink clear liquids until 1000 the morning of your surgery.   Clear liquids allowed are: Water, Non-Citrus Juices (without pulp), Carbonated Beverages, Clear Tea, Black Coffee Only, and Gatorade    Take these medicines the morning of surgery with A SIP OF WATER  carvedilol (COREG)  brimonidine (ALPHAGAN P) hydrALAZINE (APRESOLINE)  isosorbide mononitrate (IMDUR) albuterol (VENTOLIN HFA)-if needed (Please bring on the day of surgery) cetirizine (ZYRTEC)-if needed ondansetron (ZOFRAN)-if needed  Insulin Glargine Solostar (LANTUS) take 6-7 units the morning of the surgery  As of today, STOP taking any Aspirin (unless otherwise instructed by your surgeon) Aleve, Naproxen, Ibuprofen, Motrin, Advil, Goody's, BC's, all herbal medications, fish oil, and all vitamins.                      Do not wear jewelry, make up, or nail polish            Do not wear lotions, powders, perfumes/colognes, or deodorant.            Do not shave 48 hours prior to surgery.  Men may shave face and neck.            Do not bring valuables to the hospital.            [facility] is not responsible for any belongings or valuables.  Do NOT Smoke (Tobacco/Vaping) 24 hours prior to your procedure If you use a CPAP at night, you may bring all equipment for your overnight  stay.   Contacts, glasses, dentures or bridgework may not be worn into surgery.      For patients admitted to the hospital, discharge time will be determined by your treatment team.   Patients discharged the day of surgery will not be allowed to drive home, and someone needs to stay with them for 24 hours.    Special instructions:   [place]- Preparing For Surgery  Before surgery, you can play an important role. Because skin is not sterile, your skin needs to be as free of germs as possible. You can reduce the number of germs on your skin by washing with CHG (chlorahexidine gluconate) Soap before surgery.  CHG is an antiseptic cleaner which kills germs and bonds with the skin to continue killing germs even after washing.    Oral Hygiene is also important to reduce your risk of infection.  Remember - BRUSH YOUR TEETH THE MORNING OF SURGERY WITH YOUR REGULAR TOOTHPASTE  Please do not use if you have an allergy to CHG or antibacterial soaps. If your skin becomes reddened/irritated stop using the CHG.  Do not shave (including legs and underarms) for at least 48 hours prior to first CHG shower. It is OK to shave your face.  Please follow these instructions carefully.   Shower the  NIGHT BEFORE SURGERY and the MORNING OF SURGERY with DIAL Soap.   Pat yourself dry with a CLEAN TOWEL.  Wear CLEAN PAJAMAS to bed the night before surgery  Place CLEAN SHEETS on your bed the night of your first shower and DO NOT SLEEP WITH PETS.   Day of Surgery: Please shower morning of surgery  Wear Clean/Comfortable clothing the morning of surgery Do not apply any deodorants/lotions.   Remember to brush your teeth WITH YOUR REGULAR TOOTHPASTE.   Questions were answered. Patient verbalized understanding of instructions.

## 2022-10-24 NOTE — Progress Notes (Signed)
Remote ICD transmission.   

## 2022-10-25 ENCOUNTER — Encounter (HOSPITAL_COMMUNITY)
Admission: RE | Disposition: A | Discharge status: Home / Self Care | Payer: Self-pay | Source: Home / Self Care | Attending: Ophthalmology

## 2022-10-25 ENCOUNTER — Encounter (HOSPITAL_COMMUNITY): Payer: Self-pay | Admitting: Ophthalmology

## 2022-10-25 ENCOUNTER — Ambulatory Visit (HOSPITAL_COMMUNITY): Payer: Medicare Other | Admitting: Certified Registered Nurse Anesthetist

## 2022-10-25 ENCOUNTER — Ambulatory Visit (HOSPITAL_BASED_OUTPATIENT_CLINIC_OR_DEPARTMENT_OTHER): Payer: Medicare Other | Admitting: Certified Registered Nurse Anesthetist

## 2022-10-25 ENCOUNTER — Ambulatory Visit (HOSPITAL_COMMUNITY)
Admission: RE | Admit: 2022-10-25 | Discharge: 2022-10-25 | Disposition: A | Discharge status: Home / Self Care | Payer: Medicare Other | Attending: Ophthalmology | Admitting: Ophthalmology

## 2022-10-25 DIAGNOSIS — I509 Heart failure, unspecified: Secondary | ICD-10-CM

## 2022-10-25 DIAGNOSIS — H44752 Retained (nonmagnetic) (old) foreign body in vitreous body, left eye: Secondary | ICD-10-CM | POA: Diagnosis not present

## 2022-10-25 DIAGNOSIS — H338 Other retinal detachments: Secondary | ICD-10-CM | POA: Diagnosis not present

## 2022-10-25 DIAGNOSIS — H43392 Other vitreous opacities, left eye: Secondary | ICD-10-CM | POA: Diagnosis not present

## 2022-10-25 DIAGNOSIS — I252 Old myocardial infarction: Secondary | ICD-10-CM | POA: Diagnosis not present

## 2022-10-25 DIAGNOSIS — I110 Hypertensive heart disease with heart failure: Secondary | ICD-10-CM | POA: Diagnosis not present

## 2022-10-25 DIAGNOSIS — I2510 Atherosclerotic heart disease of native coronary artery without angina pectoris: Secondary | ICD-10-CM | POA: Insufficient documentation

## 2022-10-25 DIAGNOSIS — Z950 Presence of cardiac pacemaker: Secondary | ICD-10-CM | POA: Diagnosis not present

## 2022-10-25 DIAGNOSIS — Z8673 Personal history of transient ischemic attack (TIA), and cerebral infarction without residual deficits: Secondary | ICD-10-CM | POA: Insufficient documentation

## 2022-10-25 DIAGNOSIS — T85898A Other specified complication of other internal prosthetic devices, implants and grafts, initial encounter: Secondary | ICD-10-CM | POA: Diagnosis not present

## 2022-10-25 DIAGNOSIS — Z7984 Long term (current) use of oral hypoglycemic drugs: Secondary | ICD-10-CM | POA: Diagnosis not present

## 2022-10-25 DIAGNOSIS — I5022 Chronic systolic (congestive) heart failure: Secondary | ICD-10-CM | POA: Diagnosis not present

## 2022-10-25 DIAGNOSIS — Z794 Long term (current) use of insulin: Secondary | ICD-10-CM | POA: Insufficient documentation

## 2022-10-25 DIAGNOSIS — R569 Unspecified convulsions: Secondary | ICD-10-CM | POA: Diagnosis not present

## 2022-10-25 DIAGNOSIS — E119 Type 2 diabetes mellitus without complications: Secondary | ICD-10-CM | POA: Insufficient documentation

## 2022-10-25 HISTORY — PX: PARS PLANA VITRECTOMY: SHX2166

## 2022-10-25 HISTORY — PX: SILICON OIL REMOVAL: SHX5305

## 2022-10-25 HISTORY — PX: PHOTOCOAGULATION WITH LASER: SHX6027

## 2022-10-25 LAB — GLUCOSE, CAPILLARY
Glucose-Capillary: 157 mg/dL — ABNORMAL HIGH (ref 70–99)
Glucose-Capillary: 120 mg/dL — ABNORMAL HIGH (ref 70–99)
Glucose-Capillary: 107 mg/dL — ABNORMAL HIGH (ref 70–99)

## 2022-10-25 SURGERY — PARS PLANA VITRECTOMY WITH 25 GAUGE
Anesthesia: Monitor Anesthesia Care | Site: Eye | Laterality: Left

## 2022-10-25 MED ORDER — SODIUM HYALURONATE 10 MG/ML IO SOLUTION
PREFILLED_SYRINGE | INTRAOCULAR | Status: DC | PRN
  Administered 2022-10-25: .85 mL via INTRAOCULAR

## 2022-10-25 MED ORDER — LIDOCAINE HCL 2 % IJ SOLN
INTRAMUSCULAR | Status: DC | PRN
  Administered 2022-10-25: 6 mL via RETROBULBAR

## 2022-10-25 MED ORDER — CHLORHEXIDINE GLUCONATE 0.12 % MT SOLN: 15.0000 mL | OROMUCOSAL | Status: AC

## 2022-10-25 MED ORDER — TOBRAMYCIN-DEXAMETHASONE 0.3-0.1 % OP OINT
TOPICAL_OINTMENT | OPHTHALMIC | Status: DC | PRN
  Administered 2022-10-25: 1 via OPHTHALMIC

## 2022-10-25 MED ORDER — INSULIN ASPART 100 UNIT/ML IJ SOLN
INTRAMUSCULAR | Status: AC
  Filled 2022-10-25: qty 1

## 2022-10-25 MED ORDER — DEXAMETHASONE SODIUM PHOSPHATE 10 MG/ML IJ SOLN
INTRAMUSCULAR | Status: DC | PRN
  Administered 2022-10-25: 10 mg via INTRAVENOUS

## 2022-10-25 MED ORDER — INSULIN ASPART 100 UNIT/ML IJ SOLN
0.0000 [IU] | INTRAMUSCULAR | Status: DC | PRN
  Administered 2022-10-25: 2 [IU] via SUBCUTANEOUS

## 2022-10-25 MED ORDER — FENTANYL CITRATE (PF) 100 MCG/2ML IJ SOLN: 25.0000 ug | INTRAMUSCULAR | Status: DC | PRN

## 2022-10-25 MED ORDER — HYALURONIDASE HUMAN 150 UNIT/ML IJ SOLN
INTRAMUSCULAR | Status: AC
  Filled 2022-10-25: qty 1

## 2022-10-25 MED ORDER — OXYCODONE HCL 5 MG/5ML PO SOLN: 5.0000 mg | Freq: Once | ORAL | Status: DC | PRN

## 2022-10-25 MED ORDER — LIDOCAINE HCL 2 % IJ SOLN
INTRAMUSCULAR | Status: AC
  Filled 2022-10-25: qty 20

## 2022-10-25 MED ORDER — EPINEPHRINE PF 1 MG/ML IJ SOLN
INTRAOCULAR | Status: DC | PRN
  Administered 2022-10-25: 500.3 mL

## 2022-10-25 MED ORDER — PHENYLEPHRINE HCL 2.5 % OP SOLN
1.0000 [drp] | OPHTHALMIC | Status: AC | PRN
  Administered 2022-10-25 (×3): 1 [drp] via OPHTHALMIC
  Filled 2022-10-25: qty 2

## 2022-10-25 MED ORDER — DEXAMETHASONE SODIUM PHOSPHATE 10 MG/ML IJ SOLN
INTRAMUSCULAR | Status: AC
  Filled 2022-10-25: qty 1

## 2022-10-25 MED ORDER — SODIUM HYALURONATE 10 MG/ML IO SOLUTION
PREFILLED_SYRINGE | INTRAOCULAR | Status: AC
  Filled 2022-10-25: qty 0.85

## 2022-10-25 MED ORDER — BUPIVACAINE HCL (PF) 0.75 % IJ SOLN
INTRAMUSCULAR | Status: AC
  Filled 2022-10-25: qty 10

## 2022-10-25 MED ORDER — SODIUM CHLORIDE 0.9 % IV SOLN: INTRAVENOUS | Status: DC

## 2022-10-25 MED ORDER — PROPOFOL 10 MG/ML IV BOLUS
INTRAVENOUS | Status: DC | PRN
  Administered 2022-10-25: 30 mg via INTRAVENOUS

## 2022-10-25 MED ORDER — TOBRAMYCIN-DEXAMETHASONE 0.3-0.1 % OP OINT
TOPICAL_OINTMENT | OPHTHALMIC | Status: AC
  Filled 2022-10-25: qty 3.5

## 2022-10-25 MED ORDER — CHLORHEXIDINE GLUCONATE 0.12 % MT SOLN
OROMUCOSAL | Status: AC
  Administered 2022-10-25: 15 mL via OROMUCOSAL
  Filled 2022-10-25: qty 15

## 2022-10-25 MED ORDER — BSS PLUS IO SOLN
INTRAOCULAR | Status: AC
  Filled 2022-10-25: qty 500

## 2022-10-25 MED ORDER — PROPARACAINE HCL 0.5 % OP SOLN
1.0000 [drp] | OPHTHALMIC | Status: AC | PRN
  Administered 2022-10-25 (×3): 1 [drp] via OPHTHALMIC
  Filled 2022-10-25: qty 15

## 2022-10-25 MED ORDER — CEFAZOLIN SUBCONJUNCTIVAL INJECTION 100 MG/0.5 ML
INJECTION | SUBCONJUNCTIVAL | Status: DC | PRN
  Administered 2022-10-25: 100 mg via SUBCONJUNCTIVAL

## 2022-10-25 MED ORDER — BSS IO SOLN
INTRAOCULAR | Status: AC
  Filled 2022-10-25: qty 15

## 2022-10-25 MED ORDER — ACETAMINOPHEN 10 MG/ML IV SOLN: 1000.0000 mg | Freq: Once | INTRAVENOUS | Status: DC | PRN

## 2022-10-25 MED ORDER — AMISULPRIDE (ANTIEMETIC) 5 MG/2ML IV SOLN: 10.0000 mg | Freq: Once | INTRAVENOUS | Status: DC | PRN

## 2022-10-25 MED ORDER — ACETAMINOPHEN 325 MG PO TABS: 325.0000 mg | ORAL_TABLET | ORAL | Status: DC | PRN

## 2022-10-25 MED ORDER — CYCLOPENTOLATE HCL 1 % OP SOLN
1.0000 [drp] | OPHTHALMIC | Status: AC | PRN
  Administered 2022-10-25 (×3): 1 [drp] via OPHTHALMIC
  Filled 2022-10-25: qty 2

## 2022-10-25 MED ORDER — CEFAZOLIN SUBCONJUNCTIVAL INJECTION 100 MG/0.5 ML
100.0000 mg | INJECTION | SUBCONJUNCTIVAL | Status: DC
  Filled 2022-10-25 (×2): qty 5

## 2022-10-25 MED ORDER — ACETAMINOPHEN 160 MG/5ML PO SOLN: 325.0000 mg | ORAL | Status: DC | PRN

## 2022-10-25 MED ORDER — TOBRAMYCIN 0.3 % OP SOLN
1.0000 [drp] | Freq: Once | OPHTHALMIC | Status: AC
  Administered 2022-10-25: 1 [drp] via OPHTHALMIC
  Filled 2022-10-25: qty 5

## 2022-10-25 MED ORDER — EPINEPHRINE PF 1 MG/ML IJ SOLN
INTRAMUSCULAR | Status: AC
  Filled 2022-10-25: qty 1

## 2022-10-25 MED ORDER — OXYCODONE HCL 5 MG PO TABS: 5.0000 mg | ORAL_TABLET | Freq: Once | ORAL | Status: DC | PRN

## 2022-10-25 MED ORDER — ATROPINE SULFATE 1 % OP SOLN
OPHTHALMIC | Status: AC
  Filled 2022-10-25: qty 5

## 2022-10-25 SURGICAL SUPPLY — 32 items
BANDAGE EYE OVAL 2 1/8 X 2 5/8 (GAUZE/BANDAGES/DRESSINGS) ×2
BNDG EYE OVAL 2 1/8 X 2 5/8 (GAUZE/BANDAGES/DRESSINGS) IMPLANT
CANNULA VLV SOFT TIP 25G (OPHTHALMIC) ×3 IMPLANT
CANNULA VLV SOFT TIP 25GA (OPHTHALMIC) ×2 IMPLANT
CLSR STERI-STRIP ANTIMIC 1/2X4 (GAUZE/BANDAGES/DRESSINGS) ×3 IMPLANT
DRAPE HALF SHEET 40X57 (DRAPES) ×3 IMPLANT
DRAPE INCISE 51X51 W/FILM STRL (DRAPES) IMPLANT
DRAPE RETRACTOR (MISCELLANEOUS) ×3 IMPLANT
GLOVE SURG SYN 7.5  E (GLOVE) ×2
GLOVE SURG SYN 7.5 E (GLOVE) ×2 IMPLANT
GLOVE SURG SYN 7.5 PF PI (GLOVE) ×6 IMPLANT
GOWN STRL REUS W/ TWL LRG LVL3 (GOWN DISPOSABLE) ×3 IMPLANT
GOWN STRL REUS W/TWL LRG LVL3 (GOWN DISPOSABLE) ×4
KIT BASIN OR (CUSTOM PROCEDURE TRAY) ×3 IMPLANT
KIT TURNOVER KIT B (KITS) ×3 IMPLANT
LENS BIOM SUPER VIEW SET DISP (MISCELLANEOUS) ×3 IMPLANT
NDL 18GX1X1/2 (RX/OR ONLY) (NEEDLE) ×3 IMPLANT
NDL FILTER BLUNT 18X1 1/2 (NEEDLE) ×9 IMPLANT
NDL RETROBULBAR 25GX1.5 (NEEDLE) IMPLANT
NEEDLE 18GX1X1/2 (RX/OR ONLY) (NEEDLE) ×6 IMPLANT
NEEDLE FILTER BLUNT 18X1 1/2 (NEEDLE) ×2 IMPLANT
NEEDLE RETROBULBAR 25GX1.5 (NEEDLE) IMPLANT
PACK VITRECTOMY CUSTOM (CUSTOM PROCEDURE TRAY) ×3 IMPLANT
PAD ARMBOARD 7.5X6 YLW CONV (MISCELLANEOUS) ×6 IMPLANT
PAK PIK VITRECTOMY CVS 25GA (OPHTHALMIC) ×3 IMPLANT
PROBE LASER ILLUM FLEX CVD 25G (OPHTHALMIC) IMPLANT
SET INJECTOR OIL FLUID CONSTEL (OPHTHALMIC) IMPLANT
SHIELD EYE LENSE ONLY DISP (GAUZE/BANDAGES/DRESSINGS) IMPLANT
SOL ANTI FOG 6CC (MISCELLANEOUS) ×3 IMPLANT
SYR 10ML LL (SYRINGE) IMPLANT
TOWEL GREEN STERILE FF (TOWEL DISPOSABLE) ×3 IMPLANT
WATER STERILE IRR 1000ML POUR (IV SOLUTION) ×3 IMPLANT

## 2022-10-25 NOTE — Progress Notes (Signed)
Anesthesia made aware of ICD, ok to use magnet if needed

## 2022-10-25 NOTE — Anesthesia Preprocedure Evaluation (Addendum)
Anesthesia Evaluation  Patient identified by MRN, date of birth, ID band Patient awake    Reviewed: Allergy & Precautions, NPO status , [name] Chart, lab work & pertinent test results  Airway Mallampati: III  TM Distance: >3 FB Neck ROM: Full    Dental  (+) Missing, Dental Advisory Given   Pulmonary     + decreased breath sounds      Cardiovascular hypertension, Pt. on home beta blockers and Pt. on medications + CAD, + Past MI and +CHF  + pacemaker + Cardiac Defibrillator  Rhythm:Regular Rate:Normal  Echo: 1. Left ventricular ejection fraction, by estimation, is 50 to 55%. The  left ventricle has low normal function. The left ventricle has no regional  wall motion abnormalities. There is severe concentric left ventricular  hypertrophy. Left ventricular  diastolic parameters are consistent with Grade I diastolic dysfunction  (impaired relaxation).   2. Right ventricular systolic function is normal. The right ventricular  size is mildly enlarged. There is normal pulmonary artery systolic  pressure. The estimated right ventricular systolic pressure is 03.0 mmHg.   3. The mitral valve is grossly normal. No evidence of mitral valve  regurgitation. No evidence of mitral stenosis.   4. The aortic valve is abnormal. Aortic valve regurgitation is not  visualized. Aortic valve sclerosis is present, with no evidence of aortic  valve stenosis.     Neuro/Psych Seizures -,  CVA, Residual Symptoms    GI/Hepatic negative GI ROS, Neg liver ROS,,,  Endo/Other  diabetes, Type 2, Insulin Dependent, Oral Hypoglycemic Agents    Renal/GU Renal disease     Musculoskeletal   Abdominal   Peds  Hematology   Anesthesia Other Findings   Reproductive/Obstetrics                             Anesthesia Physical Anesthesia Plan  ASA: 3  Anesthesia Plan: MAC   Post-op Pain Management:    Induction:  Intravenous  PONV Risk Score and Plan: 0 and Midazolam and Propofol infusion  Airway Management Planned: Natural Airway and Nasal Cannula  Additional Equipment: None  Intra-op Plan:   Post-operative Plan:   Informed Consent: I have reviewed the patients History and Physical, chart, labs and discussed the procedure including the risks, benefits and alternatives for the proposed anesthesia with the patient or authorized representative who has indicated his/her understanding and acceptance.       Plan Discussed with: CRNA  Anesthesia Plan Comments: (Consented for GA also. )       Anesthesia Quick Evaluation

## 2022-10-25 NOTE — Transfer of Care (Signed)
Immediate Anesthesia Transfer of Care Note  Patient: Jeffrey [name]  Procedure(s) Performed: PARS PLANA VITRECTOMY 25 GAUGE FOR ENDOPHTHALMITIS WITH ENDO LASER PANRETINAL PHOTOCOAGULATION (Left)  Patient Location: PACU  Anesthesia Type:MAC  Level of Consciousness: awake, alert , oriented, and patient cooperative  Airway & Oxygen Therapy: Patient Spontanous Breathing  Post-op Assessment: Report given to RN, Post -op Vital signs reviewed and stable, and Patient moving all extremities X 4  Post vital signs: Reviewed and stable  Last Vitals:  Vitals Value Taken Time  BP 153/100 10/25/22 1534  Temp    Pulse 73 10/25/22 1537  Resp 15 10/25/22 1537  SpO2 98 % 10/25/22 1537  Vitals shown include unvalidated device data.  Last Pain:  Vitals:   10/25/22 1134  TempSrc:   PainSc: 0-No pain      Patients Stated Pain Goal: 0 (11/94/17 4081)  Complications: No notable events documented.

## 2022-10-25 NOTE — Discharge Instructions (Addendum)
DO NOT SLEEP ON BACK, THE EYE PRESSURE CAN GO UP AND CAUSE VISION LOSS.  SLEEP ON SIDE UNTIL AIR BUBBLE COMPLETELY ABSORBED.   SLEEP ON SIDE WITH NOSE TO PILLOW  DURING DAY KEEP UPRIGHT

## 2022-10-25 NOTE — H&P (Signed)
Date of examination:  10/25/22  Indication for surgery: Retained silicone oil and vitreous opacities left eye  Pertinent past medical history:  Past Medical History:  Diagnosis Date   AICD (automatic cardioverter/defibrillator) present    [employer]   AICD (automatic cardioverter/defibrillator) present    MDT Visia AF MRI   Anemia    CAD (coronary artery disease)    a. cath 01/31/17: 60% 1st RPLB, 60% dist RCA, 55% prox RCA, 10% pro LAD --> Rx TX.    CHF (congestive heart failure) (HCC)    Chronic systolic CHF (congestive heart failure) ([place]) 01/28/2017   1. Echo 01/29/17:  EF 20-25, normal wall motion, mild LAE // 2. EF 10-15 by [facility] 01/2017    Diabetes mellitus without complication ([place])    type 2   Diabetic foot infection ([place]) 03/2016   RT FOOT   Dyspnea    History of kidney stones    passed   HTN (hypertension)    Hyperlipidemia    Hypertension    Myocardial infarction [facility]), although reported, NICM at cath    NICM (nonischemic cardiomyopathy) ([place]) 02/15/2017   1. Mod non-obs CAD on LHC in 01/2017 - CAD does not explain cardiomyopathy   Renal disorder    stage 4    Pertinent ocular history:  history of tractional retinal left eye  Pertinent family history:  Family History  Problem Relation Age of Onset   Diabetes Mother    Hypertension Mother    Diabetes Father    Heart attack Father    Diabetes Sister    Heart attack Maternal Grandmother     General:  Healthy appearing patient in no distress.    Eyes:    Acuity OS 20/400  External: Within normal limits     Anterior segment: Within normal limits       Fundus: ERM, retinal edema    Impression: Retained silicone oil left eye  Plan: Vitrectomy with endolaser left eye  Jalene Mullet, MD

## 2022-10-25 NOTE — Op Note (Signed)
Jeffrey [name] 10/25/2022 Diagnosis: Retained silicone oil left eye  Procedure: [place] Vitrectomy, Endolaser, and partial air/fluid exchange Operative Eye:  left eye  Surgeon: Royston Cowper Estimated Blood Loss: minimal Specimens for Pathology:  None Complications: none   The  patient was prepped and draped in the usual fashion for ocular surgery on the  left eye .  A lid speculum was placed.  Infusion line and trocar was placed at the 4 o'clock position approximately 3.5 mm from the surgical limbus.   The infusion line was allowed to run and then clamped when placed at the cannula opening. The line was inserted and secured to the drape with an adhesive strip.   Active trocars/cannula were placed at the 10 and 2 o'clock positions approximately 3.5 mm from the surgical limbus. The cannula was visualized in the vitreous cavity.  The light pipe and viscous fluid extraction kit was used to remove the silicone oil from the posterior pole.  The remaining oil was removed with the vitrector.  A complete air/fluid exchange was performed and additional silicone oil removed.  Infusion was turned on and the eye filled with fluid.  3 rows of endolaser were applied 360 degrees to the periphery.  A partial air-fluid exchange was performed.  The superior cannulas were sequentially removed with concommitant tamponade using a cotton tipped applicator and noted to be air tight.  The infusion line and trocar were removed and the sclerotomy was noted to be air tight with normal intraocular pressure by digital palpapation.  Subconjunctival injections of  Ancef and Dexamethasone 57m/1ml were placed in the infero-medial quadrant.   The speculum and drapes were removed and the eye was patched with Polymixin/Bacitracin ophthalmic ointment. An eye shield was placed and the patient was transferred alert and conversant with stable vital signs to the post operative recovery area.  The patient tolerated the  procedure well and no complications were noted.  NRoyston CowperMD

## 2022-10-25 NOTE — Brief Op Note (Signed)
10/25/2022  3:30 PM  PATIENT:  Jeffrey [name]  56 y.o. male  PRE-OPERATIVE DIAGNOSIS:  COMPLICATIONS OF OTHER INTERNAL PROSTHETIC DEVICES, IMPLANTS AND GRAFTS  OTHER RETINAL DETACHMENT  POST-OPERATIVE DIAGNOSIS:  * No post-op diagnosis entered *  PROCEDURE:  Procedure(s): PARS PLANA VITRECTOMY 25 GAUGE FOR WITH ENDO LASER PANRETINAL PHOTOCOAGULATION (Left)  SURGEON:  Surgeon(s) and Role:    * Jalene Mullet, MD - Primary  PHYSICIAN ASSISTANT:   ASSISTANTS: none   ANESTHESIA:   local and MAC  EBL:  minimal   BLOOD ADMINISTERED:none  DRAINS: none   LOCAL MEDICATIONS USED:  MARCAINE    and LIDOCAINE   SPECIMEN:  No Specimen  DISPOSITION OF SPECIMEN:  N/A  COUNTS:  YES  TOURNIQUET:  * No tourniquets in log *  DICTATION: .Note written in EPIC  PLAN OF CARE: Discharge to home after PACU  PATIENT DISPOSITION:  PACU - hemodynamically stable.   Delay start of Pharmacological VTE agent (>24hrs) due to surgical blood loss or risk of bleeding: not applicable

## 2022-10-25 NOTE — Anesthesia Postprocedure Evaluation (Signed)
Anesthesia Post Note  Patient: Jeffrey [name]  Procedure(s) Performed: PARS PLANA VITRECTOMY WITH 25 GAUGE (Left) PHOTOCOAGULATION WITH LASER (Left: Eye) SILICON OIL REMOVAL (Left: Eye)     Patient location during evaluation: PACU Anesthesia Type: MAC Level of consciousness: awake and alert Pain management: pain level controlled Vital Signs Assessment: post-procedure vital signs reviewed and stable Respiratory status: spontaneous breathing, nonlabored ventilation and respiratory function stable Cardiovascular status: stable and blood pressure returned to baseline Postop Assessment: no apparent nausea or vomiting Anesthetic complications: no  No notable events documented.  Last Vitals:  Vitals:   10/25/22 1545 10/25/22 1600  BP: (!) 145/92 (!) 162/98  Pulse: 72 75  Resp: 14 20  Temp:    SpO2: 98% 96%    Last Pain:  Vitals:   10/25/22 1600  TempSrc:   PainSc: 0-No pain                 Yanin Muhlestein,W. EDMOND

## 2022-10-26 ENCOUNTER — Encounter (HOSPITAL_COMMUNITY): Payer: Self-pay | Admitting: Ophthalmology

## 2022-10-26 ENCOUNTER — Other Ambulatory Visit: Payer: Self-pay

## 2022-10-26 MED ORDER — PREDNISOLONE ACETATE 1 % OP SUSP
OPHTHALMIC | 0 refills | Status: AC
  Filled 2022-10-26: qty 10, 28d supply, fill #0

## 2022-10-26 MED ORDER — OFLOXACIN 0.3 % OP SOLN
1.0000 [drp] | Freq: Four times a day (QID) | OPHTHALMIC | 0 refills | Status: DC
  Filled 2022-10-26: qty 5, 25d supply, fill #0

## 2022-11-02 ENCOUNTER — Encounter: Payer: Self-pay | Admitting: Internal Medicine

## 2022-11-02 ENCOUNTER — Ambulatory Visit (INDEPENDENT_AMBULATORY_CARE_PROVIDER_SITE_OTHER): Payer: Medicare Other | Admitting: Internal Medicine

## 2022-11-02 VITALS — BP 128/82 | HR 87 | Ht 67.0 in | Wt 190.8 lb

## 2022-11-02 DIAGNOSIS — E1165 Type 2 diabetes mellitus with hyperglycemia: Secondary | ICD-10-CM | POA: Diagnosis not present

## 2022-11-02 DIAGNOSIS — E782 Mixed hyperlipidemia: Secondary | ICD-10-CM | POA: Diagnosis not present

## 2022-11-02 DIAGNOSIS — E1159 Type 2 diabetes mellitus with other circulatory complications: Secondary | ICD-10-CM | POA: Diagnosis not present

## 2022-11-02 LAB — POCT GLYCOSYLATED HEMOGLOBIN (HGB A1C): Hemoglobin A1C: 7.4 % — AB (ref 4.0–5.6)

## 2022-11-02 NOTE — Patient Instructions (Addendum)
Please continue: - Lantus 12 units in a.m. - Ozempic 0.5 mg weekly in a.m.  Stop sodas! DO NOT BUY THEM.  Please return in 3-4 months.

## 2022-11-02 NOTE — Progress Notes (Signed)
Patient ID: Jeffrey [name], male   DOB: 10-Oct-1966, 56 y.o.   MRN: [id number]  HPI: Jeffrey [name] is a 56 y.o.-year-old male, returning for follow-up for DM2, dx in ~2013, insulin-dependent  since dx., uncontrolled, with long-term complications (CAD, diastolic and systolic CHF, CVAs, CKD stage 4, peripheral neuropathy - s/p 2 R toe amputations, DR, recurring hypoglycemia).  He is here with his Jeffrey who offers most of the information about his diabetes history, other medical conditions, diet, and activity, along with blood sugars and insulin doses.  Patient is somnolent and dozes off during the appointment.  Last visit with me 4 months ago.  Interim history: No increased urination, blurry vision, nausea, chest pain.  He continues to have disequilibrium and walks supported by his Jeffrey. He is cutting out breads. He is still having sodas at night!  Sugars are very high in the morning after sodas.  Reviewed HbA1c: Lab Results  Component Value Date   HGBA1C 6.8 (A) 06/28/2022   HGBA1C 9.7 (H) 03/11/2022   HGBA1C 7.6 (A) 10/20/2021   HGBA1C 14.2 (H) 06/30/2021   HGBA1C 10.7 (A) 01/13/2021   HGBA1C 15.4 (H) 06/13/2020   HGBA1C 9.9 (H) 04/25/2019   HGBA1C 7.2 (A) 10/24/2018   HGBA1C >15.5 (H) 06/22/2018   HGBA1C 13.2 03/21/2018   HGBA1C 13.8 12/20/2017   HGBA1C 11.8 (H) 01/29/2017   HGBA1C 12.4 (H) 09/12/2016   HGBA1C 13.0 (H) 06/18/2016   HGBA1C 13.0 (H) 06/17/2016   HGBA1C 13.7 02/24/2016   HGBA1C 13.4 (H) 09/28/2015   HGBA1C 13.4 (H) 09/25/2015   HGBA1C 13.90 06/04/2015   HGBA1C 9.60 03/04/2015   HGBA1C 12.9 11/12/2014   HGBA1C 9.9 08/26/2014   HGBA1C >14% 04/21/2014   He is on: - Lantus 35 >> 25 >> 20 >> 12-14 units in a.m. - Ozempic 0.25 >> 0.5 mg weekly in a.m. He was previously on NovoLog 2-6 units per meal.  Pt checks his sugars 2x a day: - am:  96-120 >> 124-130s, 200s - sodas ay night! - 2h after b'fast: 134-150 >> n/c - lunch: 130-140 >> n/c - 2h after lunch:  n/c - dinner: 145-160s (sodas) >> n/c - 2h after dinner: 170s-180 >> 160s, 240 - bedtime: n/c  Previously:   Lowest sugar was 47 >> 70 >> 124; he has hypoglycemia awareness at 60.  He was admitted with hypoglycemia-induced encephalopathy (blood sugar 40) 03/10/2022.  At that time he was unresponsive He was admitted with hypoglycemia (blood sugar 47) 03/31/2022. Highest sugar was 300 >> 200 >> 240.  Glucometer: Accu-Chek guide  Pt's meals are: - Breakfast: grits, scrambled eggs and bacon or cereal or oatmeal >> fruit, croissant + cheese and egg - Lunch: PB and jelly; sandwich; soup - Dinner: same; rotisserie chicken - Snacks: seldom banana as a dessert  - + stage 4 CKD, last BUN/creatinine:  Lab Results  Component Value Date   BUN 45 (H) 08/03/2022   BUN 60 (H) 08/02/2022   CREATININE 2.33 (H) 08/03/2022   CREATININE 3.28 (H) 08/02/2022  He is on lisinopril 10 mg daily.  - + HL; last set of lipids: Lab Results  Component Value Date   CHOL 201 (H) 07/02/2021   HDL 36 (L) 07/02/2021   LDLCALC 120 (H) 07/02/2021   TRIG 225 (H) 07/02/2021   CHOLHDL 5.6 07/02/2021  On Crestor 40 mg daily.  - last eye exam was in 06/2022. + DR. Dr. Wyatt Portela. 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 Surgeon: Dr. Posey Pronto. Has cataract. Had  surgery OS as he has been given scar tissue buildup in this eye, also.  - no numbness and tingling in his feet.  Last foot exam 10/20/2021.  He is on ASA 81.  Pt has FH of DM in M, F, 1 B, 3 S's.  He is on iron po.  No personal history of pancreatitis or family history of medullary thyroid cancer or multiple endocrine neoplasia syndromes.  ROS: + See HPI  Past Medical History:  Diagnosis Date   AICD (automatic cardioverter/defibrillator) present    [employer]   AICD (automatic cardioverter/defibrillator) present    MDT [facility] AF MRI   Anemia    CAD (coronary artery disease)    a. cath 01/31/17: 60% 1st RPLB, 60% dist RCA, 55% prox RCA, 10% pro LAD --> Rx TX.    CHF  (congestive heart failure) (HCC)    Chronic systolic CHF (congestive heart failure) ([place]) 01/28/2017   1. Echo 01/29/17:  EF 20-25, normal wall motion, mild LAE // 2. EF 10-15 by [facility] 01/2017    Diabetes mellitus without complication ([place])    type 2   Diabetic foot infection ([place]) 03/2016   RT FOOT   Dyspnea    History of kidney stones    passed   HTN (hypertension)    Hyperlipidemia    Hypertension    Myocardial infarction [facility]), although reported, NICM at cath    NICM (nonischemic cardiomyopathy) ([place]) 02/15/2017   1. Mod non-obs CAD on LHC in 01/2017 - CAD does not explain cardiomyopathy   Renal disorder    stage 4   Past Surgical History:  Procedure Laterality Date   AIR/FLUID EXCHANGE Right 07/14/2020   Procedure: AIR/FLUID EXCHANGE;  Surgeon: Jalene Mullet, MD;  Location: [place];  Service: Ophthalmology;  Laterality: Right;   AMPUTATION Right 04/01/2016   Procedure: Right Great Toe Amputation;  Surgeon: Newt Minion, MD;  Location: [place];  Service: Orthopedics;  Laterality: Right;   AMPUTATION Right 06/19/2016   Procedure: AMPUTATION SECOND TOE;  Surgeon: Marybelle Killings, MD;  Location: [place];  Service: Orthopedics;  Laterality: Right;   BACK SURGERY     for abscess   CARDIAC CATHETERIZATION     ICD IMPLANT N/A 01/15/2018   Procedure: ICD IMPLANT;  Surgeon: Deboraha Sprang, MD;  Location: [place] [facility];  Service: Cardiovascular;  Laterality: N/A;   INJECTION OF SILICONE OIL Right 6/0/0459   Procedure: INJECTION OF SILICONE OIL;  Surgeon: Jalene Mullet, MD;  Location: [place];  Service: Ophthalmology;  Laterality: Right;   INJECTION OF SILICONE OIL Right 9/77/4142   Procedure: INJECTION OF SILICONE OIL;  Surgeon: Jalene Mullet, MD;  Location: [place];  Service: Ophthalmology;  Laterality: Right;   INJECTION OF SILICONE OIL Left 3/95/3202   Procedure: INJECTION OF SILICONE OIL;  Surgeon: Jalene Mullet, MD;  Location: [place];  Service: Ophthalmology;  Laterality: Left;   INSERT /  REPLACE / REMOVE PACEMAKER     MEMBRANE PEEL Right 08/25/2020   Procedure: MEMBRANE PEEL;  Surgeon: Jalene Mullet, MD;  Location: [place];  Service: Ophthalmology;  Laterality: Right;   MEMBRANE PEEL Left 07/05/2022   Procedure: MEMBRANECTOMY;  Surgeon: Jalene Mullet, MD;  Location: [place];  Service: Ophthalmology;  Laterality: Left;   PARS PLANA VITRECTOMY Right 07/14/2020   Procedure: PARS PLANA VITRECTOMY WITH 25 GAUGE, Membranetomy, drainage of subretinal fluid;  Surgeon: Jalene Mullet, MD;  Location: [place];  Service: Ophthalmology;  Laterality: Right;   PARS PLANA VITRECTOMY Right  08/25/2020   Procedure: PARS PLANA VITRECTOMY WITH 25 GAUGE;  Surgeon: Jalene Mullet, MD;  Location: [place];  Service: Ophthalmology;  Laterality: Right;   PARS PLANA VITRECTOMY Left 07/05/2022   Procedure: LEFT PARS PLANA VITRECTOMY WITH 25 GAUGE;  Surgeon: Jalene Mullet, MD;  Location: [place];  Service: Ophthalmology;  Laterality: Left;   PARS PLANA VITRECTOMY Left 10/25/2022   Procedure: PARS PLANA VITRECTOMY WITH 25 GAUGE;  Surgeon: Jalene Mullet, MD;  Location: [place];  Service: Ophthalmology;  Laterality: Left;   PHOTOCOAGULATION WITH LASER Right 07/14/2020   Procedure: PHOTOCOAGULATION WITH LASER;  Surgeon: Jalene Mullet, MD;  Location: [place];  Service: Ophthalmology;  Laterality: Right;   PHOTOCOAGULATION WITH LASER Right 08/25/2020   Procedure: PHOTOCOAGULATION WITH LASER;  Surgeon: Jalene Mullet, MD;  Location: [place];  Service: Ophthalmology;  Laterality: Right;   PHOTOCOAGULATION WITH LASER Left 07/05/2022   Procedure: PHOTOCOAGULATION WITH LASER;  Surgeon: Jalene Mullet, MD;  Location: [place];  Service: Ophthalmology;  Laterality: Left;   PHOTOCOAGULATION WITH LASER Left 10/25/2022   Procedure: PHOTOCOAGULATION WITH LASER;  Surgeon: Jalene Mullet, MD;  Location: [place];  Service: Ophthalmology;  Laterality: Left;   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   Procedure: REPAIR OF  HEMORRHAGIC DETACHMENT;  Surgeon: Jalene Mullet, MD;  Location: [place];  Service: Ophthalmology;  Laterality: Right;   RIGHT/LEFT HEART CATH AND CORONARY ANGIOGRAPHY N/A 01/31/2017   Procedure: Right/Left Heart Cath and Coronary Angiography;  Surgeon: Troy Sine, MD;  Location: [place] [facility];  Service: Cardiovascular;  Laterality: N/A;   SILICON OIL REMOVAL Right 3/53/6144   Procedure: SILICON OIL REMOVAL;  Surgeon: Jalene Mullet, MD;  Location: [place];  Service: Ophthalmology;  Laterality: Right;   SILICON OIL REMOVAL Left 31/54/0086   Procedure: SILICON OIL REMOVAL;  Surgeon: Jalene Mullet, MD;  Location: [place];  Service: Ophthalmology;  Laterality: Left;   Social History   Socioeconomic History   Marital status: Significant Other    Spouse name: Significant other Pamala [name]   Number of children: 1   Years of education: Not on file   Highest education level: Not on file  Occupational History   Occupation: Disabled  Tobacco Use   Smoking status: Never   Smokeless tobacco: Never  Vaping Use   Vaping Use: Never used  Substance and Sexual Activity   Alcohol use: Never   Drug use: Never   Sexual activity: Not Currently  Other Topics Concern   Not on file  Social History Narrative   ** Merged History Encounter **       Social Determinants of Health   Financial Resource Strain: Not on file  Food Insecurity: Not on file  Transportation Needs: Not on file  Physical Activity: Not on file  Stress: Not on file  Social Connections: Not on file  Intimate Partner Violence: Not on file   Current Outpatient Medications on File Prior to Visit  Medication Sig Dispense Refill   Accu-Chek Softclix Lancets lancets Use to check blood sugar three times daily E11.65 100 each 5   Accu-Chek Softclix Lancets lancets use lancet to check blood glucose once daily 100 each 3   albuterol (VENTOLIN HFA) 108 (90 Base) MCG/ACT inhaler Inhale 1 to 2 puffs by mouth into the lungs every 6 hours as  needed for wheezing or shortness of breath. 18 g 0   Blood Glucose Monitoring Suppl (ACCU-CHEK GUIDE) w/Device KIT Use to check blood sugar three times daily E11.65 1 kit  0   Blood Glucose Monitoring Suppl (ACCU-CHEK GUIDE) w/Device KIT use kit to check blod glucose 4-5 daily 1 kit 0   brimonidine (ALPHAGAN P) 0.1 % SOLN Place 1 drop into the left eye 2 (two) times daily. 5 mL 2   carvedilol (COREG) 12.5 MG tablet Take 12.5 mg by mouth 2 (two) times daily with a meal.     cetirizine (ZYRTEC) 10 MG tablet TAKE 1 TABLET (10 MG TOTAL) BY MOUTH DAILY. (Patient taking differently: Take 10 mg by mouth daily as needed for allergies.) 30 tablet 0   Cholecalciferol (D3-50) 1.25 MG (50000 UT) capsule Take 1 capsule by mouth once weekly (Patient taking differently: Take 50,000 Units by mouth every Sunday.) 4 capsule 1   Continuous Blood Gluc Receiver (FREESTYLE LIBRE 2 READER) DEVI Use as directed four to five times daily. 1 each 0   Continuous Blood Gluc Sensor (FREESTYLE LIBRE 2 SENSOR) MISC Use as directed, to montior blood sugar 4 to 5 times a day daily. 2 each 11   dorzolamide-timolol (COSOPT) 22.3-6.8 MG/ML ophthalmic solution Place 1 drop into the left eye 2 (two) times daily. (Patient not taking: Reported on 10/10/2022) 10 mL 2   Elastic Bandages & Supports (T.E.D. KNEE LENGTH/S-LONG) MISC 1 (one) each daily, apply compression stockings daily to help decrease leg swelling 1 each 0   ferrous sulfate 325 (65 FE) MG tablet Take 325 mg by mouth in the morning and at bedtime.     furosemide (LASIX) 40 MG tablet Take 40 mg by mouth in the morning and at bedtime.     gabapentin (NEURONTIN) 300 MG capsule TAKE 2 CAPSULES BY MOUTH AT BEDTIME (Patient taking differently: Take 300 mg by mouth at bedtime.) 180 capsule 3   gabapentin (NEURONTIN) 300 MG capsule take 2 capsules by mouth daily at bedtime 180 capsule 3   glucose blood (ACCU-CHEK GUIDE) test strip Use to check blood sugar three times daily E11.65 100 each  12   glucose blood test strip use strip to check blood glucose 4-5 times daily 300 strip 3   hydrALAZINE (APRESOLINE) 50 MG tablet Take 1 tablet (50 mg total) by mouth 3 (three) times daily. 270 tablet 3   Insulin Glargine Solostar (LANTUS) 100 UNIT/ML Solostar Pen inject 12-14 units subcutaneously in the morning (Patient taking differently: Inject 14 Units into the skin in the morning.) 30 mL 6   Insulin Pen Needle (B-D UF III MINI PEN NEEDLES) 31G X 5 MM MISC Inject 1 Pen into the skin daily. 100 each 3   Insulin Pen Needle (BD PEN NEEDLE NANO U/F) 32G X 4 MM MISC USE TO INJECT LANTUS DAILY. MUST USE NEW PEN NEEDLE WITH EACH INJECTION. 100 each 3   isosorbide mononitrate (IMDUR) 30 MG 24 hr tablet Take 30 mg by mouth in the morning.     lactulose (CHRONULAC) 10 GM/15ML solution Take 30 mLs (20 g total) by mouth daily. (Patient taking differently: Take 20 g by mouth daily as needed (constipation.).) 1800 mL 3   ofloxacin (OCUFLOX) 0.3 % ophthalmic solution Place 1 drop into the left eye 4 (four) times daily for 1 week 5 mL 0   ondansetron (ZOFRAN) 4 MG tablet Take 1 tablet (4 mg total) by mouth every 6 (six) hours as needed for nausea or vomiting. 12 tablet 0   prednisoLONE acetate (PRED FORTE) 1 % ophthalmic suspension INSTILL 1 DROP INTO THE LEFT EYE FOUR TIMES A DAY x 7 days, THEN THREE TIMES A DAY  x 7 days, THEN TWICE A DAY x 7 days, THEN DAILY X 1 WEEK (Patient not taking: Reported on 10/10/2022) 10 mL 0   prednisoLONE acetate (PRED FORTE) 1 % ophthalmic suspension Place 1 drop into the left eye 4 (four) times daily for 7 days, THEN 1 drop 3 (three) times daily for 7 days, THEN 1 drop 2 (two) times daily for 7 days, THEN 1 drop daily for 7 days. 10 mL 0   Semaglutide,0.25 or 0.5MG/DOS, 2 MG/3ML SOPN Inject 0.5 mg into the skin once a week. (Patient taking differently: Inject 0.5 mg into the skin every Sunday.) 3 mL 3   tamsulosin (FLOMAX) 0.4 MG CAPS capsule TAKE 1 CAPSULE BY MOUTH AT BEDTIME  (STOP IF DIZZINESS OCCURS) 90 capsule 3   [DISCONTINUED] amLODipine (NORVASC) 10 MG tablet Take 1 tablet (10 mg total) by mouth daily. 30 tablet 0   No current facility-administered medications on file prior to visit.   No Known Allergies Family History  Problem Relation Age of Onset   Diabetes Mother    Hypertension Mother    Diabetes Father    Heart attack Father    Diabetes Sister    Heart attack Maternal Grandmother    PE: There were no vitals taken for this visit. Wt Readings from Last 3 Encounters:  10/25/22 176 lb 5.9 oz (80 kg)  08/01/22 206 lb 9.1 oz (93.7 kg)  07/24/22 204 lb (92.5 kg)   Constitutional: overweight, in NAD; had difficulty rising from the chair, had to be helped by Jeffrey Eyes: no exophthalmos ENT: no thyromegaly, no cervical lymphadenopathy Cardiovascular: RRR, No MRG, B mild periankle edema Respiratory: CTA B Musculoskeletal: no deformities except first to right toes amputated Skin: no rashes Neurological: no tremor with outstretched hands Diabetic Foot Exam - Simple   Simple Foot Form Diabetic Foot exam was performed with the following findings: Yes 11/02/2022 10:23 AM  Visual Inspection See comments: Yes Sensation Testing See comments: Yes Pulse Check See comments: Yes Comments + transmetatarsal amputation R  +  decreased pulses B + no sensation to monofilament     ASSESSMENT: 1. DM2, insulin-dependent, uncontrolled, with complications - CAD - diastolic and systolic CHF - CVAs - CKD stage 3 - DR - peripheral neuropathy, s/p amputations 1st and 2nd R toes - recurring hypoglycemia -admitted with hypoglycemia induced encephalopathy on 03/10/2022 - glu of 40 and was unresponsive.  Admitted again with hypoglycemia at 47 on 03/31/2022.  2. HL  PLAN:  1. Patient with longstanding, uncontrolled, type 2 diabetes, previously on a basal bolus insulin regimen with recurrent episodes of hypoglycemia.  After starting Ozempic, he came off mealtime  insulin.  At last visit, sugars were much improved so we did not change his regimen.  He was still drinking sodas, which I advised him to stop. -At today's visit, sugars are higher and per Jeffrey's report, he is still drinking sodas, especially in the middle of the night, when she is not observing him.  I strongly advised her not to buy them.  Otherwise, we will continue the current regimen. - I suggested to:  Patient Instructions  Please continue: - Lantus 12 units in a.m. - Ozempic 0.5 mg weekly in a.m.  Stop sodas! DO NOT BUY THEM.  Please return in 3-4 months.  - we checked his HbA1c: 7.4% (higher) - advised to check sugars at different times of the day - 4x a day, rotating check times - advised for yearly eye exams >> he is  UTD - we checked a foot exam at today's visit.  He has poor circulation and poor sensation.  He has a history of transmetatarsal amputation -discussed about seeing podiatry.  They plan to [facility]. - return to clinic in 3-4 months   2. HL -Reviewed latest lipid panel: All fractions abnormal: Lab Results  Component Value Date   CHOL 201 (H) 07/02/2021   HDL 36 (L) 07/02/2021   LDLCALC 120 (H) 07/02/2021   TRIG 225 (H) 07/02/2021   CHOLHDL 5.6 07/02/2021  -He continues on Crestor 40 mg daily without side effects -They have an appointment with PCP coming up  [name], MD PhD [facility] Endocrinology

## 2022-11-07 ENCOUNTER — Other Ambulatory Visit: Payer: Self-pay

## 2022-11-15 ENCOUNTER — Other Ambulatory Visit: Payer: Self-pay | Admitting: Family Medicine

## 2022-11-15 ENCOUNTER — Other Ambulatory Visit: Payer: Self-pay

## 2022-11-15 DIAGNOSIS — I110 Hypertensive heart disease with heart failure: Secondary | ICD-10-CM

## 2022-11-15 NOTE — Telephone Encounter (Signed)
Requested medications are due for refill today.  no  Requested medications are on the active medications list.  AS historical  Last refill. 10/20/2021  Future visit scheduled.   no  Notes to clinic.  Medication was discontinued 03/17/2022. Jeffrey [name] listed as PCP.    Requested Prescriptions  Pending Prescriptions Disp Refills   furosemide (LASIX) 40 MG tablet 60 tablet 6    Sig: Take 1 tablet (40 mg total) by mouth 2 (two) times daily.     Cardiovascular:  Diuretics - Loop Failed - 11/15/2022  4:36 PM      Failed - Ca in normal range and within 180 days    Calcium  Date Value Ref Range Status  08/03/2022 8.8 (L) 8.9 - 10.3 mg/dL Final   Calcium, Ion  Date Value Ref Range Status  08/06/2021 1.20 1.15 - 1.40 mmol/L Final         Failed - Cr in normal range and within 180 days    Creat  Date Value Ref Range Status  02/15/2017 0.91 0.70 - 1.33 mg/dL Final    Comment:      For patients > or = 57 years of age: The upper reference limit for Creatinine is approximately 13% higher for people identified as African-American.      Creatinine, Ser  Date Value Ref Range Status  08/03/2022 2.33 (H) 0.61 - 1.24 mg/dL Final   Creatinine, Urine  Date Value Ref Range Status  03/31/2022 77.11 mg/dL Final    Comment:    Performed at [facility], [place] [address], [place], [place] [address]         Failed - Cl in normal range and within 180 days    Chloride  Date Value Ref Range Status  08/03/2022 115 (H) 98 - 111 mmol/L Final         Failed - Mg Level in normal range and within 180 days    Magnesium  Date Value Ref Range Status  03/17/2022 1.9 1.7 - 2.4 mg/dL Final    Comment:    Performed at [facility], [place] [address], [place], [place] [address]         Failed - Valid encounter within last 6 months    Recent Outpatient Visits           1 year ago Diabetic nephropathy associated with type 2 diabetes mellitus ([place])   [place], Tiro, MD   1 year ago Uncontrolled type 2 diabetes mellitus with hyperglycemia, with long-term current use of insulin [facility])   [place], [place], [place]   1 year ago    [place], [place]   1 year ago Hypertensive heart disease with chronic systolic congestive heart failure [facility])   [place] R[place], MD   1 year ago Non-compliance   [place], [place], MD       Future Appointments             In 2 days [place], [place], [place] [facility] [place] [facility], [employer] - K in normal range and within 180 days    Potassium  Date Value Ref Range Status  08/03/2022 4.5 3.5 - 5.1 mmol/L Final         Passed -  Na in normal range and within 180 days    Sodium  Date Value Ref Range Status  08/03/2022 138 135 - 145 mmol/L Final  03/09/2022 144 134 - 144 mmol/L Final         Passed - Last BP in normal range    BP Readings from Last 1 Encounters:  11/02/22 128/82

## 2022-11-17 ENCOUNTER — Ambulatory Visit: Payer: Medicare Other | Admitting: Podiatry

## 2022-11-22 ENCOUNTER — Other Ambulatory Visit: Payer: Self-pay

## 2022-11-22 MED ORDER — FUROSEMIDE 40 MG PO TABS
40.0000 mg | ORAL_TABLET | Freq: Two times a day (BID) | ORAL | 11 refills | Status: DC
  Filled 2022-11-22: qty 180, 90d supply, fill #0
  Filled 2023-02-15: qty 180, 90d supply, fill #1
  Filled 2023-05-16: qty 180, 90d supply, fill #2
  Filled 2023-08-14 – 2023-08-23 (×2): qty 180, 90d supply, fill #3
  Filled 2023-11-15: qty 180, 90d supply, fill #4

## 2022-11-23 ENCOUNTER — Other Ambulatory Visit: Payer: Self-pay

## 2022-11-23 ENCOUNTER — Encounter: Payer: Self-pay | Admitting: Gastroenterology

## 2022-11-24 ENCOUNTER — Encounter: Payer: Self-pay | Admitting: Podiatry

## 2022-11-24 ENCOUNTER — Ambulatory Visit (INDEPENDENT_AMBULATORY_CARE_PROVIDER_SITE_OTHER): Payer: Medicare Other | Admitting: Podiatry

## 2022-11-24 DIAGNOSIS — R0989 Other specified symptoms and signs involving the circulatory and respiratory systems: Secondary | ICD-10-CM | POA: Diagnosis not present

## 2022-11-24 DIAGNOSIS — M79609 Pain in unspecified limb: Secondary | ICD-10-CM | POA: Diagnosis not present

## 2022-11-24 DIAGNOSIS — E1142 Type 2 diabetes mellitus with diabetic polyneuropathy: Secondary | ICD-10-CM

## 2022-11-24 DIAGNOSIS — B351 Tinea unguium: Secondary | ICD-10-CM

## 2022-11-27 NOTE — Progress Notes (Signed)
Subjective:  Patient ID: Jeffrey [name], male    DOB: [birth year],  MRN: [id number] HPI Chief Complaint  Patient presents with   Debridement    Requesting toenail trim, also diabetic doc states no palpable pulses   Diabetes    Last A1c was 7.4    56 y.o. male presents with the above complaint.   ROS: Denies fever chills nausea vomit muscle aches pains calf pain back pain chest pain shortness of breath.  Past Medical History:  Diagnosis Date   AICD (automatic cardioverter/defibrillator) present    [employer]   AICD (automatic cardioverter/defibrillator) present    MDT [facility] AF MRI   Anemia    CAD (coronary artery disease)    a. cath 01/31/17: 60% 1st RPLB, 60% dist RCA, 55% prox RCA, 10% pro LAD --> Rx TX.    CHF (congestive heart failure) (HCC)    Chronic systolic CHF (congestive heart failure) ([place]) 01/28/2017   1. Echo 01/29/17:  EF 20-25, normal wall motion, mild LAE // 2. EF 10-15 by [facility] 01/2017    Diabetes mellitus without complication ([place])    type 2   Diabetic foot infection ([place]) 03/2016   RT FOOT   Dyspnea    History of kidney stones    passed   HTN (hypertension)    Hyperlipidemia    Hypertension    Myocardial infarction [facility]), although reported, NICM at cath    NICM (nonischemic cardiomyopathy) ([place]) 02/15/2017   1. Mod non-obs CAD on LHC in 01/2017 - CAD does not explain cardiomyopathy   Renal disorder    stage 4   Past Surgical History:  Procedure Laterality Date   AIR/FLUID EXCHANGE Right 07/14/2020   Procedure: AIR/FLUID EXCHANGE;  Surgeon: Jalene Mullet, MD;  Location: [place];  Service: Ophthalmology;  Laterality: Right;   AMPUTATION Right 04/01/2016   Procedure: Right Great Toe Amputation;  Surgeon: Newt Minion, MD;  Location: [place];  Service: Orthopedics;  Laterality: Right;   AMPUTATION Right 06/19/2016   Procedure: AMPUTATION SECOND TOE;  Surgeon: Marybelle Killings, MD;  Location: [place];  Service: Orthopedics;  Laterality: Right;   BACK SURGERY     for  abscess   CARDIAC CATHETERIZATION     ICD IMPLANT N/A 01/15/2018   Procedure: ICD IMPLANT;  Surgeon: Deboraha Sprang, MD;  Location: [place] [facility];  Service: Cardiovascular;  Laterality: N/A;   INJECTION OF SILICONE OIL Right 05/14/6947   Procedure: INJECTION OF SILICONE OIL;  Surgeon: Jalene Mullet, MD;  Location: [place];  Service: Ophthalmology;  Laterality: Right;   INJECTION OF SILICONE OIL Right 5/46/2703   Procedure: INJECTION OF SILICONE OIL;  Surgeon: Jalene Mullet, MD;  Location: [place];  Service: Ophthalmology;  Laterality: Right;   INJECTION OF SILICONE OIL Left 5/00/9381   Procedure: INJECTION OF SILICONE OIL;  Surgeon: Jalene Mullet, MD;  Location: [place];  Service: Ophthalmology;  Laterality: Left;   INSERT / REPLACE / REMOVE PACEMAKER     MEMBRANE PEEL Right 08/25/2020   Procedure: MEMBRANE PEEL;  Surgeon: Jalene Mullet, MD;  Location: [place];  Service: Ophthalmology;  Laterality: Right;   MEMBRANE PEEL Left 07/05/2022   Procedure: MEMBRANECTOMY;  Surgeon: Jalene Mullet, MD;  Location: [place];  Service: Ophthalmology;  Laterality: Left;   PARS PLANA VITRECTOMY Right 07/14/2020   Procedure: PARS PLANA VITRECTOMY WITH 25 GAUGE, Membranetomy, drainage of subretinal fluid;  Surgeon: Jalene Mullet, MD;  Location: [place];  Service: Ophthalmology;  Laterality: Right;   PARS  PLANA VITRECTOMY Right 08/25/2020   Procedure: PARS PLANA VITRECTOMY WITH 25 GAUGE;  Surgeon: Jalene Mullet, MD;  Location: [place];  Service: Ophthalmology;  Laterality: Right;   PARS PLANA VITRECTOMY Left 07/05/2022   Procedure: LEFT PARS PLANA VITRECTOMY WITH 25 GAUGE;  Surgeon: Jalene Mullet, MD;  Location: [place];  Service: Ophthalmology;  Laterality: Left;   PARS PLANA VITRECTOMY Left 10/25/2022   Procedure: PARS PLANA VITRECTOMY WITH 25 GAUGE;  Surgeon: Jalene Mullet, MD;  Location: [place];  Service: Ophthalmology;  Laterality: Left;   PHOTOCOAGULATION WITH LASER Right 07/14/2020   Procedure:  PHOTOCOAGULATION WITH LASER;  Surgeon: Jalene Mullet, MD;  Location: [place];  Service: Ophthalmology;  Laterality: Right;   PHOTOCOAGULATION WITH LASER Right 08/25/2020   Procedure: PHOTOCOAGULATION WITH LASER;  Surgeon: Jalene Mullet, MD;  Location: [place];  Service: Ophthalmology;  Laterality: Right;   PHOTOCOAGULATION WITH LASER Left 07/05/2022   Procedure: PHOTOCOAGULATION WITH LASER;  Surgeon: Jalene Mullet, MD;  Location: [place];  Service: Ophthalmology;  Laterality: Left;   PHOTOCOAGULATION WITH LASER Left 10/25/2022   Procedure: PHOTOCOAGULATION WITH LASER;  Surgeon: Jalene Mullet, MD;  Location: [place];  Service: Ophthalmology;  Laterality: Left;   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   Procedure: REPAIR OF HEMORRHAGIC DETACHMENT;  Surgeon: Jalene Mullet, MD;  Location: [place];  Service: Ophthalmology;  Laterality: Right;   RIGHT/LEFT HEART CATH AND CORONARY ANGIOGRAPHY N/A 01/31/2017   Procedure: Right/Left Heart Cath and Coronary Angiography;  Surgeon: Troy Sine, MD;  Location: [place] [facility];  Service: Cardiovascular;  Laterality: N/A;   SILICON OIL REMOVAL Right 02/24/1760   Procedure: SILICON OIL REMOVAL;  Surgeon: Jalene Mullet, MD;  Location: [place];  Service: Ophthalmology;  Laterality: Right;   SILICON OIL REMOVAL Left 60/73/7106   Procedure: SILICON OIL REMOVAL;  Surgeon: Jalene Mullet, MD;  Location: [place];  Service: Ophthalmology;  Laterality: Left;    Current Outpatient Medications:    Accu-Chek Softclix Lancets lancets, Use to check blood sugar three times daily E11.65, Disp: 100 each, Rfl: 5   Accu-Chek Softclix Lancets lancets, use lancet to check blood glucose once daily, Disp: 100 each, Rfl: 3   albuterol (VENTOLIN HFA) 108 (90 Base) MCG/ACT inhaler, Inhale 1 to 2 puffs by mouth into the lungs every 6 hours as needed for wheezing or shortness of breath., Disp: 18 g, Rfl: 0   Blood Glucose Monitoring Suppl (ACCU-CHEK GUIDE) w/Device  KIT, Use to check blood sugar three times daily E11.65, Disp: 1 kit, Rfl: 0   Blood Glucose Monitoring Suppl (ACCU-CHEK GUIDE) w/Device KIT, use kit to check blod glucose 4-5 daily, Disp: 1 kit, Rfl: 0   brimonidine (ALPHAGAN P) 0.1 % SOLN, Place 1 drop into the left eye 2 (two) times daily., Disp: 5 mL, Rfl: 2   carvedilol (COREG) 12.5 MG tablet, Take 12.5 mg by mouth 2 (two) times daily with a meal., Disp: , Rfl:    cetirizine (ZYRTEC) 10 MG tablet, TAKE 1 TABLET (10 MG TOTAL) BY MOUTH DAILY. (Patient taking differently: Take 10 mg by mouth daily as needed for allergies.), Disp: 30 tablet, Rfl: 0   Cholecalciferol (D3-50) 1.25 MG (50000 UT) capsule, Take 1 capsule by mouth once weekly (Patient taking differently: Take 50,000 Units by mouth every Sunday.), Disp: 4 capsule, Rfl: 1   Elastic Bandages & Supports (T.E.D. KNEE LENGTH/S-LONG) MISC, 1 (one) each daily, apply compression stockings daily to help decrease leg swelling, Disp: 1 each, Rfl: 0  ferrous sulfate 325 (65 FE) MG tablet, Take 325 mg by mouth in the morning and at bedtime., Disp: , Rfl:    furosemide (LASIX) 40 MG tablet, Take 40 mg by mouth in the morning and at bedtime., Disp: , Rfl:    furosemide (LASIX) 40 MG tablet, Take 1 tablet (40 mg total) by mouth 2 (two) times daily., Disp: 180 tablet, Rfl: 11   gabapentin (NEURONTIN) 300 MG capsule, TAKE 2 CAPSULES BY MOUTH AT BEDTIME (Patient taking differently: Take 300 mg by mouth at bedtime.), Disp: 180 capsule, Rfl: 3   gabapentin (NEURONTIN) 300 MG capsule, take 2 capsules by mouth daily at bedtime, Disp: 180 capsule, Rfl: 3   glucose blood (ACCU-CHEK GUIDE) test strip, Use to check blood sugar three times daily E11.65, Disp: 100 each, Rfl: 12   glucose blood test strip, use strip to check blood glucose 4-5 times daily, Disp: 300 strip, Rfl: 3   hydrALAZINE (APRESOLINE) 50 MG tablet, Take 1 tablet (50 mg total) by mouth 3 (three) times daily., Disp: 270 tablet, Rfl: 3   Insulin  Glargine Solostar (LANTUS) 100 UNIT/ML Solostar Pen, inject 12-14 units subcutaneously in the morning (Patient taking differently: Inject 14 Units into the skin in the morning.), Disp: 30 mL, Rfl: 6   Insulin Pen Needle (B-D UF III MINI PEN NEEDLES) 31G X 5 MM MISC, Inject 1 Pen into the skin daily., Disp: 100 each, Rfl: 3   Insulin Pen Needle (BD PEN NEEDLE NANO U/F) 32G X 4 MM MISC, USE TO INJECT LANTUS DAILY. MUST USE NEW PEN NEEDLE WITH EACH INJECTION., Disp: 100 each, Rfl: 3   isosorbide mononitrate (IMDUR) 30 MG 24 hr tablet, Take 30 mg by mouth in the morning., Disp: , Rfl:    lactulose (CHRONULAC) 10 GM/15ML solution, Take 30 mLs (20 g total) by mouth daily. (Patient taking differently: Take 20 g by mouth daily as needed (constipation.).), Disp: 1800 mL, Rfl: 3   ofloxacin (OCUFLOX) 0.3 % ophthalmic solution, Place 1 drop into the left eye 4 (four) times daily for 1 week, Disp: 5 mL, Rfl: 0   ondansetron (ZOFRAN) 4 MG tablet, Take 1 tablet (4 mg total) by mouth every 6 (six) hours as needed for nausea or vomiting., Disp: 12 tablet, Rfl: 0   Semaglutide,0.25 or 0.5MG/DOS, 2 MG/3ML SOPN, Inject 0.5 mg into the skin once a week. (Patient taking differently: Inject 0.5 mg into the skin every Sunday.), Disp: 3 mL, Rfl: 3   tamsulosin (FLOMAX) 0.4 MG CAPS capsule, TAKE 1 CAPSULE BY MOUTH AT BEDTIME (STOP IF DIZZINESS OCCURS), Disp: 90 capsule, Rfl: 3  No Known Allergies Review of Systems Objective:  There were no vitals filed for this visit.  General: Well developed, nourished, in no acute distress, alert and oriented x3   Dermatological: Skin is warm, dry and supple bilateral. Nails x 10 are long thick yellow dystrophic clinically mycotic; remaining integument appears unremarkable at this time. There are no open sores, no preulcerative lesions, no rash or signs of infection present.  No open lesions or wounds noted  Vascular: Dorsalis Pedis artery and Posterior Tibial artery pedal pulses are  0/4 bilateral with immedate capillary fill time. Pedal hair growth present. No varicosities and no lower extremity edema present bilateral.   Neruologic: Grossly intact via light touch bilateral. Vibratory intact via tuning fork bilateral. Protective threshold with Semmes Wienstein monofilament intact to all pedal sites bilateral. Patellar and Achilles deep tendon reflexes 2+ bilateral. No Babinski or clonus noted  bilateral.   Musculoskeletal: No gross boney pedal deformities bilateral. No pain, crepitus, or limitation noted with foot and ankle range of motion bilateral. Muscular strength 5/5 in all groups tested bilateral.  History of amputation hallux second digit right  Gait: Unassisted, Nonantalgic.    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  No iatrogenic lesions noted.     Zoi Devine T. [place], [place]

## 2022-11-28 ENCOUNTER — Other Ambulatory Visit: Payer: Self-pay

## 2022-12-01 ENCOUNTER — Other Ambulatory Visit: Payer: Self-pay

## 2022-12-01 MED ORDER — LOSARTAN POTASSIUM 25 MG PO TABS
25.0000 mg | ORAL_TABLET | Freq: Every day | ORAL | 11 refills | Status: DC
  Filled 2022-12-01: qty 30, 30d supply, fill #0

## 2022-12-01 MED ORDER — LISINOPRIL 10 MG PO TABS
10.0000 mg | ORAL_TABLET | Freq: Every day | ORAL | 2 refills | Status: DC
  Filled 2022-12-01: qty 90, 90d supply, fill #0

## 2022-12-07 ENCOUNTER — Other Ambulatory Visit: Payer: Self-pay

## 2022-12-15 ENCOUNTER — Ambulatory Visit (AMBULATORY_SURGERY_CENTER): Payer: Medicare Other | Admitting: *Deleted

## 2022-12-15 ENCOUNTER — Other Ambulatory Visit: Payer: Self-pay

## 2022-12-15 VITALS — Ht 67.0 in | Wt 187.0 lb

## 2022-12-15 DIAGNOSIS — Z1211 Encounter for screening for malignant neoplasm of colon: Secondary | ICD-10-CM

## 2022-12-15 MED ORDER — NA SULFATE-K SULFATE-MG SULF 17.5-3.13-1.6 GM/177ML PO SOLN
1.0000 | Freq: Once | ORAL | 0 refills | Status: AC
  Filled 2022-12-15: qty 354, 2d supply, fill #0

## 2022-12-15 MED ORDER — OFLOXACIN 0.3 % OP SOLN
1.0000 [drp] | Freq: Four times a day (QID) | OPHTHALMIC | 0 refills | Status: AC
  Filled 2022-12-15: qty 5, 13d supply, fill #0

## 2022-12-15 NOTE — Progress Notes (Signed)
No egg or soy allergy known to patient  No issues known to pt with past sedation with any surgeries or procedures Patient denies ever being told they had issues or difficulty with intubation  No FH of Malignant Hyperthermia Pt is not on diet pills Pt is not on  home 02  Pt is not on blood thinners  Pt denies issues with constipation  Pt is not on dialysis Pt denies any upcoming cardiac testing Pt encouraged to use to use Singlecare or Goodrx to reduce cost  [name] chart reviewed by Osvaldo Angst CNRA prior to previsit and patient appropriate for the [place].  Previsit completed and red dot placed by patient's name on their procedure day (on provider's schedule).  . Visit done by phone Instructions sent by mail

## 2022-12-16 ENCOUNTER — Other Ambulatory Visit: Payer: Self-pay

## 2022-12-26 ENCOUNTER — Other Ambulatory Visit: Payer: Self-pay

## 2023-01-04 ENCOUNTER — Ambulatory Visit: Payer: 59 | Admitting: Gastroenterology

## 2023-01-04 ENCOUNTER — Encounter: Payer: Self-pay | Admitting: Gastroenterology

## 2023-01-04 VITALS — BP 199/98 | HR 74 | Temp 98.0°F | Resp 16 | Ht 67.0 in | Wt 187.0 lb

## 2023-01-04 DIAGNOSIS — Z1211 Encounter for screening for malignant neoplasm of colon: Secondary | ICD-10-CM

## 2023-01-04 MED ORDER — SODIUM CHLORIDE 0.9 % IV SOLN: 500.0000 mL | Freq: Once | INTRAVENOUS | Status: DC

## 2023-01-04 NOTE — Progress Notes (Signed)
Pt's states no medical or surgical changes since previsit or office visit. VS assessed by C.W 

## 2023-01-04 NOTE — Progress Notes (Signed)
Pt reports he is very nervous and that is why B/P is high 100mg  or propofol given . B/P didn't improve to acceptable level for procedure. Decision made to abort the procedure. VSS, to PACU, Report to [email or web address]

## 2023-01-04 NOTE — Patient Instructions (Signed)
  ACTIVITY:  You should plan to take it easy for the rest of today and you should NOT DRIVE or use heavy machinery until tomorrow (because of the sedation medicines used during the test).

## 2023-01-04 NOTE — Progress Notes (Signed)
[facility] History and Physical   Primary Care Physician:  Willene Hatchet, NP   Reason for Procedure:   Colon cancer screening  Plan:    Screening colonoscopy     HPI: Jeffrey [name] is a 57 y.o. male undergoing initial average risk screening colonoscopy.  He has no family history of colon cancer and no chronic GI symptoms.    In the procedure room, the patient's blood pressure was noted to be markedly elevated with systolics in the 761P and MAP >150.  He denied any symptoms and reportedly took his coreg and hydralazine this morning (unclear if he took losartan and amlodipine).  He admitted to being nervous, so it was thought his blood pressure might improve significantly with a small amount of sedation.  He was given 100 mg of propofol, but his blood pressure continued to be in the 190s/100s.  Due to the very elective nature of his procedure, his numerous comorbidities and severe hypertension, the decision was made to abort the procedure.  Will discuss whether to reschedule another colonoscopy vs  consider Cologuard for colon cancer screening  ADDENDUM:  After discussion with patient and his girlfriend, he would like to reattempt colonoscopy.  Patient advised to make sure he takes all his antihypertensives the morning of his procedure  Past Medical History:  Diagnosis Date   AICD (automatic cardioverter/defibrillator) present    Medtronic   AICD (automatic cardioverter/defibrillator) present    MDT Vis[facility] AF MRI   Anemia    CAD (coronary artery disease)    a. cath 01/31/17: 60% 1st RPLB, 60% dist RCA, 55% prox RCA, 10% pro LAD --> Rx TX.    CHF (congestive heart failure) ([place])    Chronic systolic CHF (congestive heart failure) ([place]) 01/28/2017   1. Echo 01/29/17:  EF 20-25, normal wall motion, mild LAE // 2. EF 10-15 by [facility] 01/2017    Diabetes mellitus without complication ([place])    type 2   Diabetic foot infection ([place]) 03/2016   RT FOOT   Dyspnea    History  of kidney stones    passed   HTN (hypertension)    Hyperlipidemia    Hypertension    Myocardial infarction [facility]), although reported, NICM at cath    NICM (nonischemic cardiomyopathy) ([place]) 02/15/2017   1. Mod non-obs CAD on LHC in 01/2017 - CAD does not explain cardiomyopathy   Renal disorder    stage 4    Past Surgical History:  Procedure Laterality Date   AIR/FLUID EXCHANGE Right 07/14/2020   Procedure: AIR/FLUID EXCHANGE;  Surgeon: Jalene Mullet, MD;  Location: [place];  Service: Ophthalmology;  Laterality: Right;   AMPUTATION Right 04/01/2016   Procedure: Right Great Toe Amputation;  Surgeon: Newt Minion, MD;  Location: [place];  Service: Orthopedics;  Laterality: Right;   AMPUTATION Right 06/19/2016   Procedure: AMPUTATION SECOND TOE;  Surgeon: Marybelle Killings, MD;  Location: [place];  Service: Orthopedics;  Laterality: Right;   BACK SURGERY     for abscess   CARDIAC CATHETERIZATION     ICD IMPLANT N/A 01/15/2018   Procedure: ICD IMPLANT;  Surgeon: Deboraha Sprang, MD;  Location: [place] [facility];  Service: Cardiovascular;  Laterality: N/A;   INJECTION OF SILICONE OIL Right 5/0/9326   Procedure: INJECTION OF SILICONE OIL;  Surgeon: Jalene Mullet, MD;  Location: [place];  Service: Ophthalmology;  Laterality: Right;   INJECTION OF SILICONE OIL Right 06/23/4579   Procedure: INJECTION OF SILICONE OIL;  Surgeon: Jalene Mullet, MD;  Location: [place];  Service: Ophthalmology;  Laterality: Right;   INJECTION OF SILICONE OIL Left 5/85/2778   Procedure: INJECTION OF SILICONE OIL;  Surgeon: Jalene Mullet, MD;  Location: [place];  Service: Ophthalmology;  Laterality: Left;   INSERT / REPLACE / REMOVE PACEMAKER     MEMBRANE PEEL Right 08/25/2020   Procedure: MEMBRANE PEEL;  Surgeon: Jalene Mullet, MD;  Location: [place];  Service: Ophthalmology;  Laterality: Right;   MEMBRANE PEEL Left 07/05/2022   Procedure: MEMBRANECTOMY;  Surgeon: Jalene Mullet, MD;  Location: [place];  Service: Ophthalmology;   Laterality: Left;   PARS PLANA VITRECTOMY Right 07/14/2020   Procedure: PARS PLANA VITRECTOMY WITH 25 GAUGE, Membranetomy, drainage of subretinal fluid;  Surgeon: Jalene Mullet, MD;  Location: [place];  Service: Ophthalmology;  Laterality: Right;   PARS PLANA VITRECTOMY Right 08/25/2020   Procedure: PARS PLANA VITRECTOMY WITH 25 GAUGE;  Surgeon: Jalene Mullet, MD;  Location: [place];  Service: Ophthalmology;  Laterality: Right;   PARS PLANA VITRECTOMY Left 07/05/2022   Procedure: LEFT PARS PLANA VITRECTOMY WITH 25 GAUGE;  Surgeon: Jalene Mullet, MD;  Location: [place];  Service: Ophthalmology;  Laterality: Left;   PARS PLANA VITRECTOMY Left 10/25/2022   Procedure: PARS PLANA VITRECTOMY WITH 25 GAUGE;  Surgeon: Jalene Mullet, MD;  Location: [place];  Service: Ophthalmology;  Laterality: Left;   PHOTOCOAGULATION WITH LASER Right 07/14/2020   Procedure: PHOTOCOAGULATION WITH LASER;  Surgeon: Jalene Mullet, MD;  Location: [place];  Service: Ophthalmology;  Laterality: Right;   PHOTOCOAGULATION WITH LASER Right 08/25/2020   Procedure: PHOTOCOAGULATION WITH LASER;  Surgeon: Jalene Mullet, MD;  Location: [place];  Service: Ophthalmology;  Laterality: Right;   PHOTOCOAGULATION WITH LASER Left 07/05/2022   Procedure: PHOTOCOAGULATION WITH LASER;  Surgeon: Jalene Mullet, MD;  Location: [place];  Service: Ophthalmology;  Laterality: Left;   PHOTOCOAGULATION WITH LASER Left 10/25/2022   Procedure: PHOTOCOAGULATION WITH LASER;  Surgeon: Jalene Mullet, MD;  Location: [place];  Service: Ophthalmology;  Laterality: Left;   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   Procedure: REPAIR OF HEMORRHAGIC DETACHMENT;  Surgeon: Jalene Mullet, MD;  Location: [place];  Service: Ophthalmology;  Laterality: Right;   RIGHT/LEFT HEART CATH AND CORONARY ANGIOGRAPHY N/A 01/31/2017   Procedure: Right/Left Heart Cath and Coronary Angiography;  Surgeon: Troy Sine, MD;  Location: [place] [facility];  Service:  Cardiovascular;  Laterality: N/A;   SILICON OIL REMOVAL Right 2/42/3536   Procedure: SILICON OIL REMOVAL;  Surgeon: Jalene Mullet, MD;  Location: [place];  Service: Ophthalmology;  Laterality: Right;   SILICON OIL REMOVAL Left 14/43/1540   Procedure: SILICON OIL REMOVAL;  Surgeon: Jalene Mullet, MD;  Location: [place];  Service: Ophthalmology;  Laterality: Left;    Prior to Admission medications   Medication Sig Start Date End Date Taking? Authorizing Provider  carvedilol (COREG) 12.5 MG tablet Take 12.5 mg by mouth 2 (two) times daily with a meal. 07/11/22  Yes [provider]  cetirizine (ZYRTEC) 10 MG tablet TAKE 1 TABLET (10 MG TOTAL) BY MOUTH DAILY. Patient taking differently: Take 10 mg by mouth daily as needed for allergies. 07/27/20  Yes Charlott Rakes, MD  ferrous sulfate 325 (65 FE) MG tablet Take 325 mg by mouth in the morning and at bedtime.   Yes [provider]  furosemide (LASIX) 40 MG tablet Take 40 mg by mouth in the morning and at bedtime.   Yes [provider]  furosemide (  LASIX) 40 MG tablet Take 1 tablet (40 mg total) by mouth 2 (two) times daily. 11/22/22  Yes   gabapentin (NEURONTIN) 300 MG capsule TAKE 2 CAPSULES BY MOUTH AT BEDTIME Patient taking differently: Take 300 mg by mouth at bedtime. 04/27/22  Yes   hydrALAZINE (APRESOLINE) 50 MG tablet Take 1 tablet (50 mg total) by mouth 3 (three) times daily. 08/03/22  Yes Tat, Shanon Brow, MD  Insulin Glargine Solostar (LANTUS) 100 UNIT/ML Solostar Pen inject 12-14 units subcutaneously in the morning Patient taking differently: Inject 14 Units into the skin in the morning. 07/27/22  Yes   Insulin Pen Needle (B-D UF III MINI PEN NEEDLES) 31G X 5 MM MISC Inject 1 Pen into the skin daily. 09/20/22  Yes   Insulin Pen Needle (BD PEN NEEDLE NANO U/F) 32G X 4 MM MISC USE TO INJECT LANTUS DAILY. MUST USE NEW PEN NEEDLE WITH EACH INJECTION. 04/18/22  Yes [name], MD  isosorbide mononitrate (IMDUR) 30 MG 24 hr  tablet Take 30 mg by mouth in the morning. 07/11/22  Yes [provider]  lactulose (CHRONULAC) 10 GM/15ML solution Take 30 mLs (20 g total) by mouth daily. Patient taking differently: Take 20 g by mouth daily as needed (constipation.). 08/19/22  Yes   ofloxacin (OCUFLOX) 0.3 % ophthalmic solution Place 1 drop into both eyes 4 (four) times daily. 12/15/22  Yes Jalene Mullet, MD  Semaglutide,0.25 or 0.5MG /DOS, 2 MG/3ML SOPN Inject 0.5 mg into the skin once a week. Patient taking differently: Inject 0.5 mg into the skin every Sunday. 07/26/22  Yes [name], MD  tamsulosin (FLOMAX) 0.4 MG CAPS capsule TAKE 1 CAPSULE BY MOUTH AT BEDTIME (STOP IF DIZZINESS OCCURS) 04/27/22  Yes   Accu-Chek Softclix Lancets lancets Use to check blood sugar three times daily E11.65 Patient not taking: Reported on 01/04/2023 08/12/21   Charlott Rakes, MD  Accu-Chek Softclix Lancets lancets use lancet to check blood glucose once daily Patient not taking: Reported on 01/04/2023 08/19/22     albuterol (VENTOLIN HFA) 108 (90 Base) MCG/ACT inhaler Inhale 1 to 2 puffs by mouth into the lungs every 6 hours as needed for wheezing or shortness of breath. 10/07/21   Argentina Donovan, PA-C  Blood Glucose Monitoring Suppl (ACCU-CHEK GUIDE) w/Device KIT Use to check blood sugar three times daily E11.65 Patient not taking: Reported on 01/04/2023 08/12/21   Charlott Rakes, MD  Blood Glucose Monitoring Suppl (ACCU-CHEK GUIDE) w/Device KIT use kit to check blod glucose 4-5 daily Patient not taking: Reported on 01/04/2023 08/19/22     brimonidine (ALPHAGAN P) 0.1 % SOLN Place 1 drop into the left eye 2 (two) times daily. Patient not taking: Reported on 01/04/2023 08/18/22   Jalene Mullet, MD  Cholecalciferol (D3-50) 1.25 MG (50000 UT) capsule Take 1 capsule by mouth once weekly Patient not taking: Reported on 01/04/2023 03/07/22   Claudia Desanctis, MD  Elastic Bandages & Supports (T.E.D. KNEE LENGTH/S-LONG) MISC 1 (one) each daily, apply  compression stockings daily to help decrease leg swelling Patient not taking: Reported on 01/04/2023 11/11/21     glucose blood (ACCU-CHEK GUIDE) test strip Use to check blood sugar three times daily E11.65 Patient not taking: Reported on 01/04/2023 08/12/21   Charlott Rakes, MD  glucose blood test strip use strip to check blood glucose 4-5 times daily Patient not taking: Reported on 01/04/2023 08/19/22     lisinopril (ZESTRIL) 10 MG tablet Take 1 tablet (10 mg total) by mouth daily. Patient not taking: Reported  on 12/15/2022 12/01/22     losartan (COZAAR) 25 MG tablet Take 1 tablet (25 mg total) by mouth daily. Patient not taking: Reported on 01/04/2023 11/30/22   Claudia Desanctis, MD  ondansetron [facility]) 4 MG tablet Take 1 tablet (4 mg total) by mouth every 6 (six) hours as needed for nausea or vomiting. 05/39/76   Delora Fuel, MD  amLODipine (NORVASC) 10 MG tablet Take 1 tablet (10 mg total) by mouth daily. 03/17/22 04/27/22  Thurnell Lose, MD    Current Outpatient Medications  Medication Sig Dispense Refill   carvedilol (COREG) 12.5 MG tablet Take 12.5 mg by mouth 2 (two) times daily with a meal.     cetirizine (ZYRTEC) 10 MG tablet TAKE 1 TABLET (10 MG TOTAL) BY MOUTH DAILY. (Patient taking differently: Take 10 mg by mouth daily as needed for allergies.) 30 tablet 0   ferrous sulfate 325 (65 FE) MG tablet Take 325 mg by mouth in the morning and at bedtime.     furosemide (LASIX) 40 MG tablet Take 40 mg by mouth in the morning and at bedtime.     furosemide (LASIX) 40 MG tablet Take 1 tablet (40 mg total) by mouth 2 (two) times daily. 180 tablet 11   gabapentin (NEURONTIN) 300 MG capsule TAKE 2 CAPSULES BY MOUTH AT BEDTIME (Patient taking differently: Take 300 mg by mouth at bedtime.) 180 capsule 3   hydrALAZINE (APRESOLINE) 50 MG tablet Take 1 tablet (50 mg total) by mouth 3 (three) times daily. 270 tablet 3   Insulin Glargine Solostar (LANTUS) 100 UNIT/ML Solostar Pen inject 12-14 units  subcutaneously in the morning (Patient taking differently: Inject 14 Units into the skin in the morning.) 30 mL 6   Insulin Pen Needle (B-D UF III MINI PEN NEEDLES) 31G X 5 MM MISC Inject 1 Pen into the skin daily. 100 each 3   Insulin Pen Needle (BD PEN NEEDLE NANO U/F) 32G X 4 MM MISC USE TO INJECT LANTUS DAILY. MUST USE NEW PEN NEEDLE WITH EACH INJECTION. 100 each 3   isosorbide mononitrate (IMDUR) 30 MG 24 hr tablet Take 30 mg by mouth in the morning.     lactulose (CHRONULAC) 10 GM/15ML solution Take 30 mLs (20 g total) by mouth daily. (Patient taking differently: Take 20 g by mouth daily as needed (constipation.).) 1800 mL 3   ofloxacin (OCUFLOX) 0.3 % ophthalmic solution Place 1 drop into both eyes 4 (four) times daily. 5 mL 0   Semaglutide,0.25 or 0.5MG /DOS, 2 MG/3ML SOPN Inject 0.5 mg into the skin once a week. (Patient taking differently: Inject 0.5 mg into the skin every Sunday.) 3 mL 3   tamsulosin (FLOMAX) 0.4 MG CAPS capsule TAKE 1 CAPSULE BY MOUTH AT BEDTIME (STOP IF DIZZINESS OCCURS) 90 capsule 3   Accu-Chek Softclix Lancets lancets Use to check blood sugar three times daily E11.65 (Patient not taking: Reported on 01/04/2023) 100 each 5   Accu-Chek Softclix Lancets lancets use lancet to check blood glucose once daily (Patient not taking: Reported on 01/04/2023) 100 each 3   albuterol (VENTOLIN HFA) 108 (90 Base) MCG/ACT inhaler Inhale 1 to 2 puffs by mouth into the lungs every 6 hours as needed for wheezing or shortness of breath. 18 g 0   Blood Glucose Monitoring Suppl (ACCU-CHEK GUIDE) w/Device KIT Use to check blood sugar three times daily E11.65 (Patient not taking: Reported on 01/04/2023) 1 kit 0   Blood Glucose Monitoring Suppl (ACCU-CHEK GUIDE) w/Device KIT use kit to  check blod glucose 4-5 daily (Patient not taking: Reported on 01/04/2023) 1 kit 0   brimonidine (ALPHAGAN P) 0.1 % SOLN Place 1 drop into the left eye 2 (two) times daily. (Patient not taking: Reported on 01/04/2023) 5 mL  2   Cholecalciferol (D3-50) 1.25 MG (50000 UT) capsule Take 1 capsule by mouth once weekly (Patient not taking: Reported on 01/04/2023) 4 capsule 1   Elastic Bandages & Supports (T.E.D. KNEE LENGTH/S-LONG) MISC 1 (one) each daily, apply compression stockings daily to help decrease leg swelling (Patient not taking: Reported on 01/04/2023) 1 each 0   glucose blood (ACCU-CHEK GUIDE) test strip Use to check blood sugar three times daily E11.65 (Patient not taking: Reported on 01/04/2023) 100 each 12   glucose blood test strip use strip to check blood glucose 4-5 times daily (Patient not taking: Reported on 01/04/2023) 300 strip 3   lisinopril (ZESTRIL) 10 MG tablet Take 1 tablet (10 mg total) by mouth daily. (Patient not taking: Reported on 12/15/2022) 90 tablet 2   losartan (COZAAR) 25 MG tablet Take 1 tablet (25 mg total) by mouth daily. (Patient not taking: Reported on 01/04/2023) 30 tablet 11   ondansetron (ZOFRAN) 4 MG tablet Take 1 tablet (4 mg total) by mouth every 6 (six) hours as needed for nausea or vomiting. 12 tablet 0   Current Facility-Administered Medications  Medication Dose Route Frequency Provider Last Rate Last Admin   0.9 %  sodium chloride infusion  500 mL Intravenous Once Daryel November, MD        Allergies as of 01/04/2023   (No Known Allergies)    Family History  Problem Relation Age of Onset   Diabetes Mother    Hypertension Mother    Diabetes Father    Heart attack Father    Diabetes Sister    Heart attack Maternal Grandmother    Colon cancer Neg Hx    Colon polyps Neg Hx    Esophageal cancer Neg Hx    Rectal cancer Neg Hx    Stomach cancer Neg Hx     Social History   Socioeconomic History   Marital status: Significant Other    Spouse name: Significant other [name]   Number of children: 1   Years of education: Not on file   Highest education level: Not on file  Occupational History   Occupation: Disabled  Tobacco Use   Smoking status: Never    Smokeless tobacco: Never  Vaping Use   Vaping Use: Never used  Substance and Sexual Activity   Alcohol use: Never   Drug use: Never   Sexual activity: Not Currently  Other Topics Concern   Not on file  Social History Narrative   ** Merged History Encounter **       Social Determinants of Health   Financial Resource Strain: Not on file  Food Insecurity: Not on file  Transportation Needs: Not on file  Physical Activity: Not on file  Stress: Not on file  Social Connections: Not on file  Intimate Partner Violence: Not on file    Review of Systems:  All other review of systems negative except as mentioned in the HPI.  Physical Exam: Vital signs BP (!) 164/88   Pulse 70   Temp 98 F (36.7 C) (Skin)   Ht 5\' 7"  (1.702 m)   Wt 187 lb (84.8 kg)   SpO2 98%   BMI 29.29 kg/m   General:   Alert,  Well-developed, well-nourished, pleasant and cooperative in  NAD Airway:  Mallampati 2 Lungs:  Clear throughout to auscultation.   Heart:  Regular rate and rhythm; no murmurs, clicks, rubs,  or gallops.  AICD in place Abdomen:  Soft, nontender and nondistended. Normal bowel sounds.   Neuro/Psych:  Normal mood and affect. A and O x 3   Keelyn Fjelstad E. Candis Schatz, MD [facility]

## 2023-01-10 ENCOUNTER — Other Ambulatory Visit: Payer: Self-pay

## 2023-01-10 MED ORDER — LOSARTAN POTASSIUM 25 MG PO TABS
25.0000 mg | ORAL_TABLET | Freq: Two times a day (BID) | ORAL | 3 refills | Status: DC
  Filled 2023-01-10: qty 180, 90d supply, fill #0
  Filled 2023-01-30 – 2023-04-12 (×2): qty 180, 90d supply, fill #1
  Filled 2023-07-06: qty 180, 90d supply, fill #2

## 2023-01-13 ENCOUNTER — Other Ambulatory Visit: Payer: Self-pay

## 2023-01-17 ENCOUNTER — Other Ambulatory Visit: Payer: Self-pay

## 2023-01-17 MED ORDER — VITAMIN D (ERGOCALCIFEROL) 1.25 MG (50000 UNIT) PO CAPS
ORAL_CAPSULE | ORAL | 1 refills | Status: DC
  Filled 2023-01-17: qty 12, 84d supply, fill #0
  Filled 2023-05-17: qty 12, 84d supply, fill #1

## 2023-01-19 ENCOUNTER — Other Ambulatory Visit: Payer: Self-pay

## 2023-01-20 ENCOUNTER — Ambulatory Visit (INDEPENDENT_AMBULATORY_CARE_PROVIDER_SITE_OTHER): Payer: 59

## 2023-01-20 DIAGNOSIS — I428 Other cardiomyopathies: Secondary | ICD-10-CM | POA: Diagnosis not present

## 2023-01-23 LAB — CUP PACEART REMOTE DEVICE CHECK
Battery Remaining Longevity: 69 mo
Battery Voltage: 2.91 V
Brady Statistic RV Percent Paced: 0.01 %
Date Time Interrogation Session: 20240212110355
HighPow Impedance: 54 Ohm
Implantable Lead Connection Status: 753985
Implantable Lead Implant Date: 20190204
Implantable Lead Location: 753860
Implantable Pulse Generator Implant Date: 20190204
Lead Channel Impedance Value: 266 Ohm
Lead Channel Impedance Value: 323 Ohm
Lead Channel Pacing Threshold Amplitude: 1.375 V
Lead Channel Pacing Threshold Pulse Width: 0.4 ms
Lead Channel Sensing Intrinsic Amplitude: 9.625 mV
Lead Channel Sensing Intrinsic Amplitude: 9.625 mV
Lead Channel Setting Pacing Amplitude: 3 V
Lead Channel Setting Pacing Pulse Width: 0.4 ms
Lead Channel Setting Sensing Sensitivity: 0.3 mV
Zone Setting Status: 755011
Zone Setting Status: 755011

## 2023-01-25 ENCOUNTER — Other Ambulatory Visit: Payer: Self-pay

## 2023-01-26 ENCOUNTER — Other Ambulatory Visit: Payer: Self-pay

## 2023-01-30 ENCOUNTER — Other Ambulatory Visit: Payer: Self-pay

## 2023-02-01 ENCOUNTER — Other Ambulatory Visit: Payer: Self-pay

## 2023-02-06 ENCOUNTER — Other Ambulatory Visit: Payer: Self-pay

## 2023-02-07 ENCOUNTER — Other Ambulatory Visit: Payer: Self-pay

## 2023-02-07 NOTE — Progress Notes (Signed)
Remote ICD transmission.   

## 2023-02-08 ENCOUNTER — Encounter (HOSPITAL_COMMUNITY): Payer: Self-pay | Admitting: Anesthesiology

## 2023-02-08 ENCOUNTER — Other Ambulatory Visit: Payer: Self-pay

## 2023-02-08 ENCOUNTER — Encounter: Payer: Self-pay | Admitting: Internal Medicine

## 2023-02-08 ENCOUNTER — Encounter (HOSPITAL_COMMUNITY): Payer: Self-pay | Admitting: Ophthalmology

## 2023-02-08 NOTE — Progress Notes (Signed)
PERIOPERATIVE PRESCRIPTION FOR IMPLANTED CARDIAC DEVICE PROGRAMMING  Patient Information: Name:  FERDINAND ALLIE  DOB:  10/16/1966  MRN:  [id number]  Planned Procedure:  PARS PLANA VITRECTOMY WITH 25 GAUGE WITH ENDOLASER PAN RETINAL PHOTOCOAGULATION RIGHT EYE - Right  Surgeon:  Dr. Jalene Mullet  Date of Procedure:  02/09/23  Cautery will be used.  Position during surgery:  supine   Device Information:  Clinic EP Physician:  Virl Axe, MD   Device Type:  Defibrillator Manufacturer and Phone #:  Medtronic: [id number] Pacemaker Dependent?:  No. Date of Last Device Check:  01/23/2023 Normal Device Function?:  Yes.    Electrophysiologist's Recommendations:  Have magnet available. Provide continuous ECG monitoring when magnet is used or reprogramming is to be performed.  Procedure should not interfere with device function.  No device programming or magnet placement needed.  Per Device Clinic Standing Orders, Damian Leavell, RN  1:40 PM 02/08/2023

## 2023-02-08 NOTE — Progress Notes (Signed)
PCP - Cletis [place], [place] Cardiologist - Cleatrice Burke, MD Device - Virl Axe, MD  PPM/ICD - Medtronic Device Orders - requested 02/08/23 Rep Notified - yes  Chest x-ray - 08/01/22 EKG - 08/01/22 ECHO - 03/31/22 Cardiac Cath - 01/31/17  CPAP - n/a  Fasting Blood Sugar - 94 - 121 Checks Blood Sugar - 3 times/day A1C - 7.4 - 11/02/22 Ozempic - last dose - 02/07/23 per patient  Blood Thinner Instructions: n/a Patient was instructed: As of today, STOP taking any Aspirin (unless otherwise instructed by your surgeon) Aleve, Naproxen, Ibuprofen, Motrin, Advil, Goody's, BC's, all herbal medications, fish oil, and all vitamins.   ERAS Protcol - yes, until 11:15 o'clock  COVID TEST- n/a  Anesthesia review: yes  Patient verbally denies any shortness of breath, fever, cough and chest pain during phone call   -------------  SDW INSTRUCTIONS given:  Your procedure is scheduled on Thursday, February 29th, 2024.  Report to [facility] Main Entrance "A" at 11:45 A.M., and check in at the Admitting office.  Call this number if you have problems the morning of surgery:  [phone]   Remember:  Do not eat after midnight the night before your surgery  You may drink clear liquids until 11:15 the morning of your surgery.   Clear liquids allowed are: Water, Non-Citrus Juices (without pulp), Carbonated Beverages, Clear Tea, Black Coffee Only, and Gatorade    Take these medicines the morning of surgery with A SIP OF WATER; Coreg, Hydralazine, Imdur PRN: Zyrtec, Zofran, eye drops, inhaler Please bring all inhalers with you the day of surgery.        THE MORNING OF SURGERY, take 50% of your regular dose of Lantus (6 - 7 units)  How do I manage my blood sugar before surgery? Check your blood sugar at least 4 times a day, starting 2 days before surgery, to make sure that the level is not too high or low.  Check your blood sugar the morning of your surgery when you wake up and every 2  hours until you get to the Short Stay unit.  If your blood sugar is less than 70 mg/dL, you will need to treat for low blood sugar: Do not take insulin. Treat a low blood sugar (less than 70 mg/dL) with  cup of clear juice (cranberry or apple), 4 glucose tablets, OR glucose gel. Recheck blood sugar in 15 minutes after treatment (to make sure it is greater than 70 mg/dL). If your blood sugar is not greater than 70 mg/dL on recheck, call [phone] for further instructions. Report your blood sugar to the short stay nurse when you get to Short Stay.   The day of surgery:                     Do not wear jewelry,            Do not wear lotions, powders, colognes, or deodorant.            Men may shave face and neck.            Do not bring valuables to the hospital.            [facility] is not responsible for any belongings or valuables.  Do NOT Smoke (Tobacco/Vaping) 24 hours prior to your procedure If you use a CPAP at night, you may bring all equipment for your overnight stay.   Contacts, glasses, dentures or bridgework may not be worn into  surgery.      For patients admitted to the hospital, discharge time will be determined by your treatment team.   Patients discharged the day of surgery will not be allowed to drive home, and someone needs to stay with them for 24 hours.    Special instructions:   [place]- Preparing For Surgery  Before surgery, you can play an important role. Because skin is not sterile, your skin needs to be as free of germs as possible. You can reduce the number of germs on your skin by washing with CHG (chlorahexidine gluconate) Soap before surgery.  CHG is an antiseptic cleaner which kills germs and bonds with the skin to continue killing germs even after washing.    Oral Hygiene is also important to reduce your risk of infection.  Remember - BRUSH YOUR TEETH THE MORNING OF SURGERY WITH YOUR REGULAR TOOTHPASTE  Please do not use if you have an allergy  to CHG or antibacterial soaps. If your skin becomes reddened/irritated stop using the CHG.  Do not shave (including legs and underarms) for at least 48 hours prior to first CHG shower. It is OK to shave your face.  Please follow these instructions carefully.   Shower the NIGHT BEFORE SURGERY and the MORNING OF SURGERY with DIAL Soap.   Pat yourself dry with a CLEAN TOWEL.  Wear CLEAN PAJAMAS to bed the night before surgery  Place CLEAN SHEETS on your bed the night of your first shower and DO NOT SLEEP WITH PETS.   Day of Surgery: Please shower morning of surgery  Wear Clean/Comfortable clothing the morning of surgery Do not apply any deodorants/lotions.   Remember to brush your teeth WITH YOUR REGULAR TOOTHPASTE.   Questions were answered. Patient verbalized understanding of instructions.

## 2023-02-09 ENCOUNTER — Ambulatory Visit (HOSPITAL_COMMUNITY): Admission: RE | Admit: 2023-02-09 | Payer: 59 | Source: Home / Self Care | Admitting: Ophthalmology

## 2023-02-09 ENCOUNTER — Other Ambulatory Visit: Payer: Self-pay

## 2023-02-09 SURGERY — PARS PLANA VITRECTOMY WITH 25 GAUGE
Anesthesia: Monitor Anesthesia Care | Laterality: Right

## 2023-02-14 ENCOUNTER — Other Ambulatory Visit: Payer: Self-pay

## 2023-02-20 ENCOUNTER — Encounter: Payer: 59 | Admitting: Gastroenterology

## 2023-02-21 ENCOUNTER — Other Ambulatory Visit: Payer: Self-pay

## 2023-03-01 ENCOUNTER — Ambulatory Visit: Payer: 59 | Admitting: Podiatry

## 2023-03-02 ENCOUNTER — Other Ambulatory Visit: Payer: Self-pay

## 2023-03-02 MED ORDER — ROSUVASTATIN CALCIUM 40 MG PO TABS
ORAL_TABLET | ORAL | 3 refills | Status: DC
  Filled 2023-03-02: qty 90, 90d supply, fill #0
  Filled 2023-05-29 – 2023-06-06 (×2): qty 90, 90d supply, fill #1
  Filled 2023-09-04: qty 90, 90d supply, fill #2
  Filled 2023-12-06: qty 90, 90d supply, fill #3

## 2023-03-06 ENCOUNTER — Ambulatory Visit: Payer: 59 | Admitting: Internal Medicine

## 2023-03-06 ENCOUNTER — Other Ambulatory Visit: Payer: Self-pay

## 2023-03-07 ENCOUNTER — Other Ambulatory Visit: Payer: Self-pay

## 2023-03-16 ENCOUNTER — Ambulatory Visit (AMBULATORY_SURGERY_CENTER): Payer: 59 | Admitting: Gastroenterology

## 2023-03-16 ENCOUNTER — Encounter: Payer: Self-pay | Admitting: Gastroenterology

## 2023-03-16 VITALS — BP 137/77 | HR 71 | Temp 98.7°F | Resp 19 | Ht 67.0 in | Wt 187.0 lb

## 2023-03-16 DIAGNOSIS — Z1211 Encounter for screening for malignant neoplasm of colon: Secondary | ICD-10-CM

## 2023-03-16 DIAGNOSIS — D123 Benign neoplasm of transverse colon: Secondary | ICD-10-CM | POA: Diagnosis not present

## 2023-03-16 MED ORDER — SODIUM CHLORIDE 0.9 % IV SOLN: 500.0000 mL | Freq: Once | INTRAVENOUS | Status: DC

## 2023-03-16 NOTE — Progress Notes (Signed)
Pt's states no medical or surgical changes since previsit or office visit.  Pt's fiance, [name], assisted with answering questions for check in process.

## 2023-03-16 NOTE — Progress Notes (Signed)
Sedate, gd SR, tolerated procedure well, VSS, report to RN 

## 2023-03-16 NOTE — Progress Notes (Signed)
Called to room to assist during endoscopic procedure.  Patient ID and intended procedure confirmed with present staff. Received instructions for my participation in the procedure from the performing physician.  

## 2023-03-16 NOTE — Op Note (Signed)
[place] Patient Name: Jeffrey [name] Procedure Date: 03/16/2023 1:27 PM MRN: [id number] Endoscopist: Nicki Reaper E. Candis Schatz , MD, [id number] Age: 57 Referring MD:  Date of Birth: [birth year] Gender: Male Account #: [id number] Procedure:                Colonoscopy Indications:              Screening for colorectal malignant neoplasm, This                            is the patient's first colonoscopy Medicines:                Monitored Anesthesia Care Procedure:                Pre-Anesthesia Assessment:                           - Prior to the procedure, a History and Physical                            was performed, and patient medications and                            allergies were reviewed. The patient's tolerance of                            previous anesthesia was also reviewed. The risks                            and benefits of the procedure and the sedation                            options and risks were discussed with the patient.                            All questions were answered, and informed consent                            was obtained. Prior Anticoagulants: The patient has                            taken no anticoagulant or antiplatelet agents. ASA                            Grade Assessment: III - A patient with severe                            systemic disease. After reviewing the risks and                            benefits, the patient was deemed in satisfactory                            condition to undergo the procedure.  After obtaining informed consent, the colonoscope                            was passed under direct vision. Throughout the                            procedure, the patient's blood pressure, pulse, and                            oxygen saturations were monitored continuously. The                            CF [id number] was introduced through the anus                            and advanced  to the the cecum, identified by                            appendiceal orifice and ileocecal valve. The                            colonoscopy was performed without difficulty. The                            patient tolerated the procedure well. The quality                            of the bowel preparation was inadequate in that the                            cecum was minimally visualized because of a large                            amount of solid stool that could not be cleared                            with lavage/suction. The ileocecal valve and the                            rectum were photographed. The appendiceal orifice                            was partially visualized. The bowel preparation                            used was Miralax via split dose instruction. Scope In: 1:33:19 PM Scope Out: 1:49:54 PM Scope Withdrawal Time: 0 hours 12 minutes 53 seconds  Total Procedure Duration: 0 hours 16 minutes 35 seconds  Findings:                 The perianal and digital rectal examinations were                            normal. Pertinent  negatives include normal                            sphincter tone and no palpable rectal lesions.                           A 2 mm polyp was found in the transverse colon. The                            polyp was sessile. The polyp was removed with a                            cold snare. Resection and retrieval were complete.                            Estimated blood loss was minimal.                           The exam was otherwise normal throughout the                            examined colon.                           Anal papilla(e) were hypertrophied.                           No additional abnormalities were found on                            retroflexion. Complications:            No immediate complications. Estimated Blood Loss:     Estimated blood loss was minimal. Impression:               - Preparation of the colon was  inadequate.                           - One 2 mm polyp in the transverse colon, removed                            with a cold snare. Resected and retrieved.                           - Anal papilla(e) were hypertrophied.                           - The GI Genius (intelligent endoscopy module),                            computer-aided polyp detection system powered by AI                            was utilized to detect colorectal polyps through  enhanced visualization during colonoscopy. Recommendation:           - Patient has a contact number available for                            emergencies. The signs and symptoms of potential                            delayed complications were discussed with the                            patient. Return to normal activities tomorrow.                            Written discharge instructions were provided to the                            patient.                           - Resume previous diet.                           - Continue present medications.                           - Await pathology results.                           - Repeat colonoscopy in 1 year because the bowel                            preparation was poor.                           - Recommend augmented bowel prep with next                            colonoscopy. Albertina Leise E. Candis Schatz, MD 03/16/2023 2:00:37 PM This report has been signed electronically.

## 2023-03-16 NOTE — Progress Notes (Signed)
[facility] History and Physical   Primary Care Physician:  Willene Hatchet, NP   Reason for Procedure:   Colon cancer screening  Plan:    Screening colonoscopy     HPI: Jeffrey [name] is a 57 y.o. male undergoing initial average risk screening colonoscopy.  He has no family history of colon cancer and no chronic GI symptoms.    Past Medical History:  Diagnosis Date   AICD (automatic cardioverter/defibrillator) present    [employer]   AICD (automatic cardioverter/defibrillator) present    MDT [employer] AF MRI   Anemia    CAD (coronary artery disease)    a. cath 01/31/17: 60% 1st RPLB, 60% dist RCA, 55% prox RCA, 10% pro LAD --> Rx TX.    CHF (congestive heart failure)    Chronic systolic CHF (congestive heart failure) 01/28/2017   1. Echo 01/29/17:  EF 20-25, normal wall motion, mild LAE // 2. EF 10-15 by [facility] 01/2017    Diabetes mellitus without complication    type 2   Diabetic foot infection 03/2016   RT FOOT   Dyspnea    History of kidney stones    passed   HTN (hypertension)    Hyperlipidemia    Hypertension    Myocardial infarction [facility]), although reported, NICM at cath    NICM (nonischemic cardiomyopathy) 02/15/2017   1. Mod non-obs CAD on LHC in 01/2017 - CAD does not explain cardiomyopathy   Renal disorder    stage 4    Past Surgical History:  Procedure Laterality Date   AIR/FLUID EXCHANGE Right 07/14/2020   Procedure: AIR/FLUID EXCHANGE;  Surgeon: Jalene Mullet, MD;  Location: [place];  Service: Ophthalmology;  Laterality: Right;   AMPUTATION Right 04/01/2016   Procedure: Right Great Toe Amputation;  Surgeon: Newt Minion, MD;  Location: [place];  Service: Orthopedics;  Laterality: Right;   AMPUTATION Right 06/19/2016   Procedure: AMPUTATION SECOND TOE;  Surgeon: Marybelle Killings, MD;  Location: [place];  Service: Orthopedics;  Laterality: Right;   BACK SURGERY     for abscess   CARDIAC CATHETERIZATION     EYE SURGERY     ICD IMPLANT N/A 01/15/2018    Procedure: ICD IMPLANT;  Surgeon: Deboraha Sprang, MD;  Location: [place] [facility];  Service: Cardiovascular;  Laterality: N/A;   INJECTION OF SILICONE OIL Right [id number]   Procedure: INJECTION OF SILICONE OIL;  Surgeon: Jalene Mullet, MD;  Location: [place];  Service: Ophthalmology;  Laterality: Right;   INJECTION OF SILICONE OIL Right 0000000   Procedure: INJECTION OF SILICONE OIL;  Surgeon: Jalene Mullet, MD;  Location: [place];  Service: Ophthalmology;  Laterality: Right;   INJECTION OF SILICONE OIL Left [id number]   Procedure: INJECTION OF SILICONE OIL;  Surgeon: Jalene Mullet, MD;  Location: [place];  Service: Ophthalmology;  Laterality: Left;   INSERT / REPLACE / REMOVE PACEMAKER     MEMBRANE PEEL Right 08/25/2020   Procedure: MEMBRANE PEEL;  Surgeon: Jalene Mullet, MD;  Location: [place];  Service: Ophthalmology;  Laterality: Right;   MEMBRANE PEEL Left 07/05/2022   Procedure: MEMBRANECTOMY;  Surgeon: Jalene Mullet, MD;  Location: [place];  Service: Ophthalmology;  Laterality: Left;   PARS PLANA VITRECTOMY Right 07/14/2020   Procedure: PARS PLANA VITRECTOMY WITH 25 GAUGE, Membranetomy, drainage of subretinal fluid;  Surgeon: Jalene Mullet, MD;  Location: [place];  Service: Ophthalmology;  Laterality: Right;   PARS PLANA VITRECTOMY Right 08/25/2020   Procedure: PARS PLANA VITRECTOMY WITH  25 GAUGE;  Surgeon: Jalene Mullet, MD;  Location: [place];  Service: Ophthalmology;  Laterality: Right;   PARS PLANA VITRECTOMY Left 07/05/2022   Procedure: LEFT PARS PLANA VITRECTOMY WITH 25 GAUGE;  Surgeon: Jalene Mullet, MD;  Location: [place];  Service: Ophthalmology;  Laterality: Left;   PARS PLANA VITRECTOMY Left 10/25/2022   Procedure: PARS PLANA VITRECTOMY WITH 25 GAUGE;  Surgeon: Jalene Mullet, MD;  Location: [place];  Service: Ophthalmology;  Laterality: Left;   PHOTOCOAGULATION WITH LASER Right 07/14/2020   Procedure: PHOTOCOAGULATION WITH LASER;  Surgeon: Jalene Mullet, MD;   Location: [employer];  Service: Ophthalmology;  Laterality: Right;   PHOTOCOAGULATION WITH LASER Right 08/25/2020   Procedure: PHOTOCOAGULATION WITH LASER;  Surgeon: Jalene Mullet, MD;  Location: [place];  Service: Ophthalmology;  Laterality: Right;   PHOTOCOAGULATION WITH LASER Left 07/05/2022   Procedure: PHOTOCOAGULATION WITH LASER;  Surgeon: Jalene Mullet, MD;  Location: [place];  Service: Ophthalmology;  Laterality: Left;   PHOTOCOAGULATION WITH LASER Left 10/25/2022   Procedure: PHOTOCOAGULATION WITH LASER;  Surgeon: Jalene Mullet, MD;  Location: [place];  Service: Ophthalmology;  Laterality: Left;   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   Procedure: REPAIR OF HEMORRHAGIC DETACHMENT;  Surgeon: Jalene Mullet, MD;  Location: [place];  Service: Ophthalmology;  Laterality: Right;   RIGHT/LEFT HEART CATH AND CORONARY ANGIOGRAPHY N/A 01/31/2017   Procedure: Right/Left Heart Cath and Coronary Angiography;  Surgeon: Troy Sine, MD;  Location: [place] [facility];  Service: Cardiovascular;  Laterality: N/A;   SILICON OIL REMOVAL Right 0000000   Procedure: SILICON OIL REMOVAL;  Surgeon: Jalene Mullet, MD;  Location: [place];  Service: Ophthalmology;  Laterality: Right;   SILICON OIL REMOVAL Left [id number]   Procedure: SILICON OIL REMOVAL;  Surgeon: Jalene Mullet, MD;  Location: [place];  Service: Ophthalmology;  Laterality: Left;    Prior to Admission medications   Medication Sig Start Date End Date Taking? Authorizing Provider  Accu-Chek Softclix Lancets lancets Use to check blood sugar three times daily E11.65 Patient not taking: Reported on 01/04/2023 08/12/21   Charlott Rakes, MD  Accu-Chek Softclix Lancets lancets use lancet to check blood glucose once daily Patient not taking: Reported on 01/04/2023 08/19/22     albuterol (VENTOLIN HFA) 108 (90 Base) MCG/ACT inhaler Inhale 1 to 2 puffs by mouth into the lungs every 6 hours as needed for wheezing or shortness of breath.  10/07/21   Argentina Donovan, PA-C  Blood Glucose Monitoring Suppl (ACCU-CHEK GUIDE) w/Device KIT Use to check blood sugar three times daily E11.65 Patient not taking: Reported on 01/04/2023 08/12/21   Charlott Rakes, MD  Blood Glucose Monitoring Suppl (ACCU-CHEK GUIDE) w/Device KIT use kit to check blod glucose 4-5 daily Patient not taking: Reported on 01/04/2023 08/19/22     brimonidine (ALPHAGAN P) 0.1 % SOLN Place 1 drop into the left eye 2 (two) times daily. Patient not taking: Reported on 01/04/2023 08/18/22   Jalene Mullet, MD  carvedilol (COREG) 12.5 MG tablet Take 12.5 mg by mouth 2 (two) times daily with a meal. 07/11/22   [provider]  cetirizine (ZYRTEC) 10 MG tablet TAKE 1 TABLET (10 MG TOTAL) BY MOUTH DAILY. Patient taking differently: Take 10 mg by mouth daily as needed for allergies. 07/27/20   Charlott Rakes, MD  Cholecalciferol (D3-50) 1.25 MG (50000 UT) capsule Take 1 capsule by mouth once weekly Patient not taking: Reported on 01/04/2023 03/07/22   Claudia Desanctis, MD  Elastic Bandages &  Supports (T.E.D. KNEE LENGTH/S-LONG) MISC 1 (one) each daily, apply compression stockings daily to help decrease leg swelling Patient not taking: Reported on 01/04/2023 11/11/21     ferrous sulfate 325 (65 FE) MG tablet Take 325 mg by mouth in the morning and at bedtime.    [provider]  furosemide (LASIX) 40 MG tablet Take 1 tablet (40 mg total) by mouth 2 (two) times daily. 11/22/22     gabapentin (NEURONTIN) 300 MG capsule TAKE 2 CAPSULES BY MOUTH AT BEDTIME Patient taking differently: Take 300 mg by mouth at bedtime. 04/27/22     glucose blood (ACCU-CHEK GUIDE) test strip Use to check blood sugar three times daily E11.65 Patient not taking: Reported on 01/04/2023 08/12/21   Charlott Rakes, MD  glucose blood test strip use strip to check blood glucose 4-5 times daily Patient not taking: Reported on 01/04/2023 08/19/22     hydrALAZINE (APRESOLINE) 50 MG tablet Take 1 tablet (50 mg  total) by mouth 3 (three) times daily. 08/03/22   Orson Eva, MD  Insulin Glargine Solostar (LANTUS) 100 UNIT/ML Solostar Pen inject 12-14 units subcutaneously in the morning 07/27/22     Insulin Pen Needle (B-D UF III MINI PEN NEEDLES) 31G X 5 MM MISC Inject 1 Pen into the skin daily. 09/20/22     Insulin Pen Needle (BD PEN NEEDLE NANO U/F) 32G X 4 MM MISC USE TO INJECT LANTUS DAILY. MUST USE NEW PEN NEEDLE WITH EACH INJECTION. 04/18/22   [name], MD  isosorbide mononitrate (IMDUR) 30 MG 24 hr tablet Take 30 mg by mouth in the morning. 07/11/22   [provider]  lactulose (CHRONULAC) 10 GM/15ML solution Take 30 mLs (20 g total) by mouth daily. Patient taking differently: Take 20 g by mouth daily as needed (constipation.). 08/19/22     lisinopril (ZESTRIL) 10 MG tablet Take 1 tablet (10 mg total) by mouth daily. Patient not taking: Reported on 12/15/2022 12/01/22     losartan (COZAAR) 25 MG tablet Take 1 tablet (25 mg total) by mouth daily. Patient not taking: Reported on 01/04/2023 11/30/22   Claudia Desanctis, MD  losartan (COZAAR) 25 MG tablet Take 1 tablet (25 mg total) by mouth 2 (two) times daily for hypertension. 01/10/23     ofloxacin (OCUFLOX) 0.3 % ophthalmic solution Place 1 drop into both eyes 4 (four) times daily. 12/15/22   Jalene Mullet, MD  ondansetron (ZOFRAN) 4 MG tablet Take 1 tablet (4 mg total) by mouth every 6 (six) hours as needed for nausea or vomiting. [id number]   Delora Fuel, MD  prednisoLONE acetate (PRED FORTE) 1 % ophthalmic suspension Place 1 drop into the right eye 4 (four) times daily.    [provider]  rosuvastatin (CRESTOR) 40 MG tablet TAKE 1 TABLET BY MOUTH DAILY 03/02/23     Semaglutide,0.25 or 0.5MG /DOS, 2 MG/3ML SOPN Inject 0.5 mg into the skin once a week. Patient taking differently: Inject 0.5 mg into the skin every Sunday. 07/26/22   [name], MD  tamsulosin (FLOMAX) 0.4 MG CAPS capsule TAKE 1 CAPSULE BY MOUTH AT BEDTIME (STOP IF  DIZZINESS OCCURS) 04/27/22     Vitamin D, Ergocalciferol, (DRISDOL) 1.25 MG (50000 UNIT) CAPS capsule take 1 capsule by mouth once a week on Sunday's x 12 weeks 01/17/23     amLODipine (NORVASC) 10 MG tablet Take 1 tablet (10 mg total) by mouth daily. 03/17/22 04/27/22  Thurnell Lose, MD    Current Outpatient Medications  Medication Sig Dispense  Refill   Accu-Chek Softclix Lancets lancets Use to check blood sugar three times daily E11.65 (Patient not taking: Reported on 01/04/2023) 100 each 5   Accu-Chek Softclix Lancets lancets use lancet to check blood glucose once daily (Patient not taking: Reported on 01/04/2023) 100 each 3   albuterol (VENTOLIN HFA) 108 (90 Base) MCG/ACT inhaler Inhale 1 to 2 puffs by mouth into the lungs every 6 hours as needed for wheezing or shortness of breath. 18 g 0   Blood Glucose Monitoring Suppl (ACCU-CHEK GUIDE) w/Device KIT Use to check blood sugar three times daily E11.65 (Patient not taking: Reported on 01/04/2023) 1 kit 0   Blood Glucose Monitoring Suppl (ACCU-CHEK GUIDE) w/Device KIT use kit to check blod glucose 4-5 daily (Patient not taking: Reported on 01/04/2023) 1 kit 0   brimonidine (ALPHAGAN P) 0.1 % SOLN Place 1 drop into the left eye 2 (two) times daily. (Patient not taking: Reported on 01/04/2023) 5 mL 2   carvedilol (COREG) 12.5 MG tablet Take 12.5 mg by mouth 2 (two) times daily with a meal.     cetirizine (ZYRTEC) 10 MG tablet TAKE 1 TABLET (10 MG TOTAL) BY MOUTH DAILY. (Patient taking differently: Take 10 mg by mouth daily as needed for allergies.) 30 tablet 0   Cholecalciferol (D3-50) 1.25 MG (50000 UT) capsule Take 1 capsule by mouth once weekly (Patient not taking: Reported on 01/04/2023) 4 capsule 1   Elastic Bandages & Supports (T.E.D. KNEE LENGTH/S-LONG) MISC 1 (one) each daily, apply compression stockings daily to help decrease leg swelling (Patient not taking: Reported on 01/04/2023) 1 each 0   ferrous sulfate 325 (65 FE) MG tablet Take 325 mg by  mouth in the morning and at bedtime.     furosemide (LASIX) 40 MG tablet Take 1 tablet (40 mg total) by mouth 2 (two) times daily. 180 tablet 11   gabapentin (NEURONTIN) 300 MG capsule TAKE 2 CAPSULES BY MOUTH AT BEDTIME (Patient taking differently: Take 300 mg by mouth at bedtime.) 180 capsule 3   glucose blood (ACCU-CHEK GUIDE) test strip Use to check blood sugar three times daily E11.65 (Patient not taking: Reported on 01/04/2023) 100 each 12   glucose blood test strip use strip to check blood glucose 4-5 times daily (Patient not taking: Reported on 01/04/2023) 300 strip 3   hydrALAZINE (APRESOLINE) 50 MG tablet Take 1 tablet (50 mg total) by mouth 3 (three) times daily. 270 tablet 3   Insulin Glargine Solostar (LANTUS) 100 UNIT/ML Solostar Pen inject 12-14 units subcutaneously in the morning 30 mL 6   Insulin Pen Needle (B-D UF III MINI PEN NEEDLES) 31G X 5 MM MISC Inject 1 Pen into the skin daily. 100 each 3   Insulin Pen Needle (BD PEN NEEDLE NANO U/F) 32G X 4 MM MISC USE TO INJECT LANTUS DAILY. MUST USE NEW PEN NEEDLE WITH EACH INJECTION. 100 each 3   isosorbide mononitrate (IMDUR) 30 MG 24 hr tablet Take 30 mg by mouth in the morning.     lactulose (CHRONULAC) 10 GM/15ML solution Take 30 mLs (20 g total) by mouth daily. (Patient taking differently: Take 20 g by mouth daily as needed (constipation.).) 1800 mL 3   lisinopril (ZESTRIL) 10 MG tablet Take 1 tablet (10 mg total) by mouth daily. (Patient not taking: Reported on 12/15/2022) 90 tablet 2   losartan (COZAAR) 25 MG tablet Take 1 tablet (25 mg total) by mouth daily. (Patient not taking: Reported on 01/04/2023) 30 tablet 11   losartan (  COZAAR) 25 MG tablet Take 1 tablet (25 mg total) by mouth 2 (two) times daily for hypertension. 180 tablet 3   ofloxacin (OCUFLOX) 0.3 % ophthalmic solution Place 1 drop into both eyes 4 (four) times daily. 5 mL 0   ondansetron (ZOFRAN) 4 MG tablet Take 1 tablet (4 mg total) by mouth every 6 (six) hours as needed  for nausea or vomiting. 12 tablet 0   prednisoLONE acetate (PRED FORTE) 1 % ophthalmic suspension Place 1 drop into the right eye 4 (four) times daily.     rosuvastatin (CRESTOR) 40 MG tablet TAKE 1 TABLET BY MOUTH DAILY 90 tablet 3   Semaglutide,0.25 or 0.5MG /DOS, 2 MG/3ML SOPN Inject 0.5 mg into the skin once a week. (Patient taking differently: Inject 0.5 mg into the skin every Sunday.) 3 mL 3   tamsulosin (FLOMAX) 0.4 MG CAPS capsule TAKE 1 CAPSULE BY MOUTH AT BEDTIME (STOP IF DIZZINESS OCCURS) 90 capsule 3   Vitamin D, Ergocalciferol, (DRISDOL) 1.25 MG (50000 UNIT) CAPS capsule take 1 capsule by mouth once a week on Sunday's x 12 weeks 12 capsule 1   Current Facility-Administered Medications  Medication Dose Route Frequency Provider Last Rate Last Admin   0.9 %  sodium chloride infusion  500 mL Intravenous Once Daryel November, MD        Allergies as of 03/16/2023   (No Known Allergies)    Family History  Problem Relation Age of Onset   Diabetes Mother    Hypertension Mother    Diabetes Father    Heart attack Father    Diabetes Sister    Heart attack Maternal Grandmother    Colon cancer Neg Hx    Colon polyps Neg Hx    Esophageal cancer Neg Hx    Rectal cancer Neg Hx    Stomach cancer Neg Hx     Social History   Socioeconomic History   Marital status: Significant Other    Spouse name: Significant other [name]   Number of children: 1   Years of education: Not on file   Highest education level: Not on file  Occupational History   Occupation: Disabled  Tobacco Use   Smoking status: Never   Smokeless tobacco: Never  Vaping Use   Vaping Use: Never used  Substance and Sexual Activity   Alcohol use: Never   Drug use: Never   Sexual activity: Not Currently  Other Topics Concern   Not on file  Social History Narrative   ** Merged History Encounter **       Social Determinants of Health   Financial Resource Strain: Not on file  Food Insecurity: Not on file   Transportation Needs: Not on file  Physical Activity: Not on file  Stress: Not on file  Social Connections: Not on file  Intimate Partner Violence: Not on file    Review of Systems:  All other review of systems negative except as mentioned in the HPI.  Physical Exam: Vital signs BP 123/72   Pulse 73   Temp 98.7 F (37.1 C) (Temporal)   Ht 5\' 7"  (1.702 m)   Wt 187 lb (84.8 kg)   SpO2 96%   BMI 29.29 kg/m   General:   Alert,  Well-developed, well-nourished, pleasant and cooperative in NAD Airway:  Mallampati 1 Lungs:  Clear throughout to auscultation.   Heart:  Regular rate and rhythm; no murmurs, clicks, rubs,  or gallops. Abdomen:  Soft, nontender and nondistended. Normal bowel sounds.   Neuro/Psych:  Normal mood and affect. A and O x 3   Myrella Fahs E. Candis Schatz, MD [facility]

## 2023-03-16 NOTE — Patient Instructions (Signed)
Resume previous diet and medications. Awaiting pathology results. Repeat Colonoscopy in 1 year because the bowel prep was poor. Recommend augmented bowel prep with next Colonoscopy. Handout provided on Colon polyps.  YOU HAD AN ENDOSCOPIC PROCEDURE TODAY AT [facility]:   Refer to the procedure report that was given to you for any specific questions about what was found during the examination.  If the procedure report does not answer your questions, please call your gastroenterologist to clarify.  If you requested that your care partner not be given the details of your procedure findings, then the procedure report has been included in a sealed envelope for you to review at your convenience later.  YOU SHOULD EXPECT: Some feelings of bloating in the abdomen. Passage of more gas than usual.  Walking can help get rid of the air that was put into your GI tract during the procedure and reduce the bloating. If you had a lower endoscopy (such as a colonoscopy or flexible sigmoidoscopy) you may notice spotting of blood in your stool or on the toilet paper. If you underwent a bowel prep for your procedure, you may not have a normal bowel movement for a few days.  Please Note:  You might notice some irritation and congestion in your nose or some drainage.  This is from the oxygen used during your procedure.  There is no need for concern and it should clear up in a day or so.  SYMPTOMS TO REPORT IMMEDIATELY:  Following lower endoscopy (colonoscopy or flexible sigmoidoscopy):  Excessive amounts of blood in the stool  Significant tenderness or worsening of abdominal pains  Swelling of the abdomen that is new, acute  Fever of 100F or higher  For urgent or emergent issues, a gastroenterologist can be reached at any hour by calling [phone]. Do not use MyChart messaging for urgent concerns.    DIET:  We do recommend a small meal at first, but then you may proceed to your regular diet.   Drink plenty of fluids but you should avoid alcoholic beverages for 24 hours.  ACTIVITY:  You should plan to take it easy for the rest of today and you should NOT DRIVE or use heavy machinery until tomorrow (because of the sedation medicines used during the test).    FOLLOW UP: Our staff will call the number listed on your records the next business day following your procedure.  We will call around 7:15- 8:00 am to check on you and address any questions or concerns that you may have regarding the information given to you following your procedure. If we do not reach you, we will leave a message.     If any biopsies were taken you will be contacted by phone or by letter within the next 1-3 weeks.  Please call us at [phone] if you have not heard about the biopsies in 3 weeks.    SIGNATURES/CONFIDENTIALITY: You and/or your care partner have signed paperwork which will be entered into your electronic medical record.  These signatures attest to the fact that that the information above on your After Visit Summary has been reviewed and is understood.  Full responsibility of the confidentiality of this discharge information lies with you and/or your care-partner.

## 2023-03-17 ENCOUNTER — Telehealth: Payer: Self-pay

## 2023-03-17 ENCOUNTER — Other Ambulatory Visit: Payer: Self-pay

## 2023-03-17 NOTE — Telephone Encounter (Signed)
Left message on answering machine. 

## 2023-03-21 ENCOUNTER — Other Ambulatory Visit: Payer: Self-pay

## 2023-03-21 MED ORDER — LOSARTAN POTASSIUM 50 MG PO TABS
50.0000 mg | ORAL_TABLET | Freq: Every day | ORAL | 3 refills | Status: DC
  Filled 2023-03-21 – 2023-03-30 (×2): qty 90, 90d supply, fill #0
  Filled 2023-06-22 – 2023-06-30 (×2): qty 90, 90d supply, fill #1
  Filled 2023-09-28: qty 90, 90d supply, fill #2
  Filled 2023-12-27 – 2024-01-25 (×3): qty 90, 90d supply, fill #3

## 2023-03-28 ENCOUNTER — Encounter: Payer: Self-pay | Admitting: Gastroenterology

## 2023-03-28 ENCOUNTER — Other Ambulatory Visit: Payer: Self-pay

## 2023-03-28 ENCOUNTER — Encounter: Payer: Self-pay | Admitting: Internal Medicine

## 2023-03-28 ENCOUNTER — Ambulatory Visit (INDEPENDENT_AMBULATORY_CARE_PROVIDER_SITE_OTHER): Payer: 59 | Admitting: Internal Medicine

## 2023-03-28 VITALS — BP 130/88 | HR 82 | Ht 67.0 in | Wt 181.6 lb

## 2023-03-28 DIAGNOSIS — E1159 Type 2 diabetes mellitus with other circulatory complications: Secondary | ICD-10-CM

## 2023-03-28 DIAGNOSIS — E782 Mixed hyperlipidemia: Secondary | ICD-10-CM | POA: Diagnosis not present

## 2023-03-28 DIAGNOSIS — E1165 Type 2 diabetes mellitus with hyperglycemia: Secondary | ICD-10-CM

## 2023-03-28 LAB — POCT GLYCOSYLATED HEMOGLOBIN (HGB A1C): Hemoglobin A1C: 8.5 % — AB (ref 4.0–5.6)

## 2023-03-28 MED ORDER — FREESTYLE LIBRE 2 READER DEVI
1.0000 | Freq: Every day | 3 refills | Status: AC
  Filled 2023-03-28: qty 1, 30d supply, fill #0
  Filled 2023-04-24: qty 1, 28d supply, fill #1
  Filled 2023-05-02 – 2023-05-03 (×2): qty 1, 30d supply, fill #1

## 2023-03-28 MED ORDER — FREESTYLE LIBRE 2 SENSOR MISC
1.0000 | 3 refills | Status: AC
  Filled 2023-03-28: qty 6, 84d supply, fill #0
  Filled 2023-06-16 – 2023-06-27 (×2): qty 6, 84d supply, fill #1
  Filled 2023-09-22: qty 6, 84d supply, fill #2
  Filled 2023-12-15: qty 6, 84d supply, fill #3

## 2023-03-28 NOTE — Progress Notes (Signed)
Patient ID: Jeffrey [name], male   DOB: 05/03/1966, 57 y.o.   MRN: [id number]  HPI: Jeffrey [name] is a 57 y.o.-year-old male, returning for follow-up for DM2, dx in ~2013, insulin-dependent  since dx., uncontrolled, with long-term complications (CAD, diastolic and systolic CHF, CVAs, CKD stage 4, peripheral neuropathy - s/p 2 R toe amputations, DR, recurring hypoglycemia).  He is here with his wife who offers most of the information about his diabetes history, other medical conditions, diet, and activity, along with blood sugars and insulin doses.  Patient is somnolent and dozes off during the appointment.  Last visit with me 5 months ago.  Interim history: No increased urination, blurry vision, nausea, chest pain.  He continues to have disequilibrium and walks supported by his wife. At last visit, he was still having regular sodas even in middle of the night.  Sugars were much higher afterwards.  At this visit, wife tells me that he stopped drinking regular sodas, however, he is now drinking juice!  Reviewed HbA1c: 03/14/2023: HbA1c 8.5% reportedly, in PCPs office Lab Results  Component Value Date   HGBA1C 7.4 (A) 11/02/2022   HGBA1C 6.8 (A) 06/28/2022   HGBA1C 9.7 (H) 03/11/2022   HGBA1C 7.6 (A) 10/20/2021   HGBA1C 14.2 (H) 06/30/2021   HGBA1C 10.7 (A) 01/13/2021   HGBA1C 15.4 (H) 06/13/2020   HGBA1C 9.9 (H) 04/25/2019   HGBA1C 7.2 (A) 10/24/2018   HGBA1C >15.5 (H) 06/22/2018   HGBA1C 13.2 03/21/2018   HGBA1C 13.8 12/20/2017   HGBA1C 11.8 (H) 01/29/2017   HGBA1C 12.4 (H) 09/12/2016   HGBA1C 13.0 (H) 06/18/2016   HGBA1C 13.0 (H) 06/17/2016   HGBA1C 13.7 02/24/2016   HGBA1C 13.4 (H) 09/28/2015   HGBA1C 13.4 (H) 09/25/2015   HGBA1C 13.90 06/04/2015   HGBA1C 9.60 03/04/2015   HGBA1C 12.9 11/12/2014   HGBA1C 9.9 08/26/2014   HGBA1C >14% 04/21/2014   He is on: - Lantus 35 >> 25 >> 20 >> 12 units in a.m. - Ozempic 0.25 >> 0.5 mg weekly in a.m. He was previously on NovoLog 2-6  units per meal.  Pt checks his sugars 2x a day: - am:  96-120 >> 124-130s, 200s - sodas ay night! >> 116, 145 - 2h after b'fast: 134-150 >> n/c - lunch: 130-140 >> n/c >> 147 - 2h after lunch: n/c >> 171 - dinner: 145-160s (sodas) >> n/c >> 195 - 2h after dinner: 170s-180 >> 160s, 240  >> 68, 136, 179-265, 325 - bedtime: n/c >> 135-173  Previously:   Lowest sugar was 47 >> 70 >> 124 >> 68; he has hypoglycemia awareness at 60.  He was admitted with hypoglycemia-induced encephalopathy (blood sugar 40) 03/10/2022.  At that time he was unresponsive He was admitted with hypoglycemia (blood sugar 47) 03/31/2022. Highest sugar was 300 >> 200 >> 240 >> 300s.  Glucometer: Accu-Chek guide  Pt's meals are: - Breakfast: grits, scrambled eggs and bacon or cereal or oatmeal >> fruit, croissant + cheese and egg - Lunch: PB and jelly; sandwich; soup - Dinner: same; rotisserie chicken - Snacks: seldom banana as a dessert  - + stage 4 CKD, last BUN/creatinine:  Lab Results  Component Value Date   BUN 45 (H) 08/03/2022   BUN 60 (H) 08/02/2022   CREATININE 2.33 (H) 08/03/2022   CREATININE 3.28 (H) 08/02/2022  He is on lisinopril 10 mg daily.  - + HL; last set of lipids: 03/14/2023 - by PCP: need records Lab Results  Component Value Date  CHOL 201 (H) 07/02/2021   HDL 36 (L) 07/02/2021   LDLCALC 120 (H) 07/02/2021   TRIG 225 (H) 07/02/2021   CHOLHDL 5.6 07/02/2021  On Crestor 40 mg daily.  - last eye exam was in 06/2022. + DR. Dr. Fabian Sharp. 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 Surgeon: Dr. Allena Katz. Has cataract. Had surgery OS as he has been given scar tissue buildup in this eye, also.  - no numbness and tingling in his feet.  Last foot exam in 11/2022 by Dr. Al Corpus.  He is on ASA 81.  Pt has FH of DM in M, F, 1 B, 3 S's.  He is on iron po.  No personal history of pancreatitis or family history of medullary thyroid cancer or multiple endocrine neoplasia syndromes.  ROS: + See HPI  Past Medical  History:  Diagnosis Date   AICD (automatic cardioverter/defibrillator) present    Medtronic   AICD (automatic cardioverter/defibrillator) present    MDT Visia AF MRI   Anemia    CAD (coronary artery disease)    a. cath 01/31/17: 60% 1st RPLB, 60% dist RCA, 55% prox RCA, 10% pro LAD --> Rx TX.    CHF (congestive heart failure)    Chronic systolic CHF (congestive heart failure) 01/28/2017   1. Echo 01/29/17:  EF 20-25, normal wall motion, mild LAE // 2. EF 10-15 by [facility] 01/2017    Diabetes mellitus without complication    type 2   Diabetic foot infection 03/2016   RT FOOT   Dyspnea    History of kidney stones    passed   HTN (hypertension)    Hyperlipidemia    Hypertension    Myocardial infarction [facility]), although reported, NICM at cath    NICM (nonischemic cardiomyopathy) 02/15/2017   1. Mod non-obs CAD on LHC in 01/2017 - CAD does not explain cardiomyopathy   Renal disorder    stage 4   Past Surgical History:  Procedure Laterality Date   AIR/FLUID EXCHANGE Right 07/14/2020   Procedure: AIR/FLUID EXCHANGE;  Surgeon: Carmela Rima, MD;  Location: [facility] OR;  Service: Ophthalmology;  Laterality: Right;   AMPUTATION Right 04/01/2016   Procedure: Right Great Toe Amputation;  Surgeon: Nadara Mustard, MD;  Location: [facility] OR;  Service: Orthopedics;  Laterality: Right;   AMPUTATION Right 06/19/2016   Procedure: AMPUTATION SECOND TOE;  Surgeon: Eldred Manges, MD;  Location: [facility] OR;  Service: Orthopedics;  Laterality: Right;   BACK SURGERY     for abscess   CARDIAC CATHETERIZATION     EYE SURGERY     ICD IMPLANT N/A 01/15/2018   Procedure: ICD IMPLANT;  Surgeon: Duke Salvia, MD;  Location: [facility] INVASIVE CV LAB;  Service: Cardiovascular;  Laterality: N/A;   INJECTION OF SILICONE OIL Right 07/14/2020   Procedure: INJECTION OF SILICONE OIL;  Surgeon: Carmela Rima, MD;  Location: [facility] OR;  Service: Ophthalmology;  Laterality: Right;   INJECTION OF SILICONE OIL Right 08/25/2020   Procedure:  INJECTION OF SILICONE OIL;  Surgeon: Carmela Rima, MD;  Location: [facility] OR;  Service: Ophthalmology;  Laterality: Right;   INJECTION OF SILICONE OIL Left 07/05/2022   Procedure: INJECTION OF SILICONE OIL;  Surgeon: Carmela Rima, MD;  Location: [facility] OR;  Service: Ophthalmology;  Laterality: Left;   INSERT / REPLACE / REMOVE PACEMAKER     MEMBRANE PEEL Right 08/25/2020   Procedure: MEMBRANE PEEL;  Surgeon: Carmela Rima, MD;  Location: [facility] OR;  Service: Ophthalmology;  Laterality: Right;   MEMBRANE PEEL Left 07/05/2022  Procedure: MEMBRANECTOMY;  Surgeon: Carmela Rima, MD;  Location: [facility] OR;  Service: Ophthalmology;  Laterality: Left;   PARS PLANA VITRECTOMY Right 07/14/2020   Procedure: PARS PLANA VITRECTOMY WITH 25 GAUGE, Membranetomy, drainage of subretinal fluid;  Surgeon: Carmela Rima, MD;  Location: [facility] OR;  Service: Ophthalmology;  Laterality: Right;   PARS PLANA VITRECTOMY Right 08/25/2020   Procedure: PARS PLANA VITRECTOMY WITH 25 GAUGE;  Surgeon: Carmela Rima, MD;  Location: [facility] OR;  Service: Ophthalmology;  Laterality: Right;   PARS PLANA VITRECTOMY Left 07/05/2022   Procedure: LEFT PARS PLANA VITRECTOMY WITH 25 GAUGE;  Surgeon: Carmela Rima, MD;  Location: [facility] OR;  Service: Ophthalmology;  Laterality: Left;   PARS PLANA VITRECTOMY Left 10/25/2022   Procedure: PARS PLANA VITRECTOMY WITH 25 GAUGE;  Surgeon: Carmela Rima, MD;  Location: [facility] OR;  Service: Ophthalmology;  Laterality: Left;   PHOTOCOAGULATION WITH LASER Right 07/14/2020   Procedure: PHOTOCOAGULATION WITH LASER;  Surgeon: Carmela Rima, MD;  Location: [facility] OR;  Service: Ophthalmology;  Laterality: Right;   PHOTOCOAGULATION WITH LASER Right 08/25/2020   Procedure: PHOTOCOAGULATION WITH LASER;  Surgeon: Carmela Rima, MD;  Location: [facility] OR;  Service: Ophthalmology;  Laterality: Right;   PHOTOCOAGULATION WITH LASER Left 07/05/2022   Procedure: PHOTOCOAGULATION WITH LASER;  Surgeon: Carmela Rima, MD;  Location:  [facility] OR;  Service: Ophthalmology;  Laterality: Left;   PHOTOCOAGULATION WITH LASER Left 10/25/2022   Procedure: PHOTOCOAGULATION WITH LASER;  Surgeon: Carmela Rima, MD;  Location: [facility] OR;  Service: Ophthalmology;  Laterality: Left;   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   Procedure: REPAIR OF HEMORRHAGIC DETACHMENT;  Surgeon: Carmela Rima, MD;  Location: [facility] OR;  Service: Ophthalmology;  Laterality: Right;   RIGHT/LEFT HEART CATH AND CORONARY ANGIOGRAPHY N/A 01/31/2017   Procedure: Right/Left Heart Cath and Coronary Angiography;  Surgeon: Lennette Bihari, MD;  Location: [facility] INVASIVE CV LAB;  Service: Cardiovascular;  Laterality: N/A;   SILICON OIL REMOVAL Right 08/25/2020   Procedure: SILICON OIL REMOVAL;  Surgeon: Carmela Rima, MD;  Location: [facility] OR;  Service: Ophthalmology;  Laterality: Right;   SILICON OIL REMOVAL Left 10/25/2022   Procedure: SILICON OIL REMOVAL;  Surgeon: Carmela Rima, MD;  Location: [facility] OR;  Service: Ophthalmology;  Laterality: Left;   Social History   Socioeconomic History   Marital status: Significant Other    Spouse name: Significant other Britta Mccreedy   Number of children: 1   Years of education: Not on file   Highest education level: Not on file  Occupational History   Occupation: Disabled  Tobacco Use   Smoking status: Never   Smokeless tobacco: Never  Vaping Use   Vaping Use: Never used  Substance and Sexual Activity   Alcohol use: Never   Drug use: Never   Sexual activity: Not Currently  Other Topics Concern   Not on file  Social History Narrative   ** Merged History Encounter **       Social Determinants of Health   Financial Resource Strain: Not on file  Food Insecurity: Not on file  Transportation Needs: Not on file  Physical Activity: Not on file  Stress: Not on file  Social Connections: Not on file  Intimate Partner Violence: Not on file   Current Outpatient Medications on File Prior to Visit  Medication Sig  Dispense Refill   Accu-Chek Softclix Lancets lancets Use to check blood sugar three times daily E11.65 100 each 5   Accu-Chek Softclix Lancets lancets use lancet to check blood glucose once  daily 100 each 3   albuterol (VENTOLIN HFA) 108 (90 Base) MCG/ACT inhaler Inhale 1 to 2 puffs by mouth into the lungs every 6 hours as needed for wheezing or shortness of breath. 18 g 0   Blood Glucose Monitoring Suppl (ACCU-CHEK GUIDE) w/Device KIT Use to check blood sugar three times daily E11.65 1 kit 0   Blood Glucose Monitoring Suppl (ACCU-CHEK GUIDE) w/Device KIT use kit to check blod glucose 4-5 daily 1 kit 0   brimonidine (ALPHAGAN P) 0.1 % SOLN Place 1 drop into the left eye 2 (two) times daily. 5 mL 2   carvedilol (COREG) 12.5 MG tablet Take 12.5 mg by mouth 2 (two) times daily with a meal.     cetirizine (ZYRTEC) 10 MG tablet TAKE 1 TABLET (10 MG TOTAL) BY MOUTH DAILY. (Patient taking differently: Take 10 mg by mouth daily as needed for allergies.) 30 tablet 0   Cholecalciferol (D3-50) 1.25 MG (50000 UT) capsule Take 1 capsule by mouth once weekly 4 capsule 1   Elastic Bandages & Supports (T.E.D. KNEE LENGTH/S-LONG) MISC 1 (one) each daily, apply compression stockings daily to help decrease leg swelling (Patient not taking: Reported on 01/04/2023) 1 each 0   ferrous sulfate 325 (65 FE) MG tablet Take 325 mg by mouth in the morning and at bedtime.     furosemide (LASIX) 40 MG tablet Take 1 tablet (40 mg total) by mouth 2 (two) times daily. 180 tablet 11   gabapentin (NEURONTIN) 300 MG capsule TAKE 2 CAPSULES BY MOUTH AT BEDTIME (Patient taking differently: Take 300 mg by mouth at bedtime.) 180 capsule 3   glucose blood (ACCU-CHEK GUIDE) test strip Use to check blood sugar three times daily E11.65 100 each 12   glucose blood test strip use strip to check blood glucose 4-5 times daily 300 strip 3   hydrALAZINE (APRESOLINE) 50 MG tablet Take 1 tablet (50 mg total) by mouth 3 (three) times daily. 270 tablet 3    Insulin Glargine Solostar (LANTUS) 100 UNIT/ML Solostar Pen inject 12-14 units subcutaneously in the morning 30 mL 6   Insulin Pen Needle (B-D UF III MINI PEN NEEDLES) 31G X 5 MM MISC Inject 1 Pen into the skin daily. 100 each 3   Insulin Pen Needle (BD PEN NEEDLE NANO U/F) 32G X 4 MM MISC USE TO INJECT LANTUS DAILY. MUST USE NEW PEN NEEDLE WITH EACH INJECTION. 100 each 3   isosorbide mononitrate (IMDUR) 30 MG 24 hr tablet Take 30 mg by mouth in the morning.     lactulose (CHRONULAC) 10 GM/15ML solution Take 30 mLs (20 g total) by mouth daily. (Patient taking differently: Take 20 g by mouth daily as needed (constipation.).) 1800 mL 3   lisinopril (ZESTRIL) 10 MG tablet Take 1 tablet (10 mg total) by mouth daily. (Patient not taking: Reported on 12/15/2022) 90 tablet 2   losartan (COZAAR) 25 MG tablet Take 1 tablet (25 mg total) by mouth daily. (Patient not taking: Reported on 01/04/2023) 30 tablet 11   losartan (COZAAR) 25 MG tablet Take 1 tablet (25 mg total) by mouth 2 (two) times daily for hypertension. 180 tablet 3   losartan (COZAAR) 50 MG tablet Take 1 tablet (50 mg total) by mouth daily. 90 tablet 3   ofloxacin (OCUFLOX) 0.3 % ophthalmic solution Place 1 drop into both eyes 4 (four) times daily. 5 mL 0   ondansetron (ZOFRAN) 4 MG tablet Take 1 tablet (4 mg total) by mouth every 6 (six) hours  as needed for nausea or vomiting. 12 tablet 0   rosuvastatin (CRESTOR) 40 MG tablet TAKE 1 TABLET BY MOUTH DAILY 90 tablet 3   Semaglutide,0.25 or 0.5MG /DOS, 2 MG/3ML SOPN Inject 0.5 mg into the skin once a week. (Patient taking differently: Inject 0.5 mg into the skin every Sunday.) 3 mL 3   tamsulosin (FLOMAX) 0.4 MG CAPS capsule TAKE 1 CAPSULE BY MOUTH AT BEDTIME (STOP IF DIZZINESS OCCURS) 90 capsule 3   Vitamin D, Ergocalciferol, (DRISDOL) 1.25 MG (50000 UNIT) CAPS capsule take 1 capsule by mouth once a week on Sunday's x 12 weeks 12 capsule 1   [DISCONTINUED] amLODipine (NORVASC) 10 MG tablet Take 1  tablet (10 mg total) by mouth daily. 30 tablet 0   No current facility-administered medications on file prior to visit.   No Known Allergies Family History  Problem Relation Age of Onset   Diabetes Mother    Hypertension Mother    Diabetes Father    Heart attack Father    Diabetes Sister    Heart attack Maternal Grandmother    Colon cancer Neg Hx    Colon polyps Neg Hx    Esophageal cancer Neg Hx    Rectal cancer Neg Hx    Stomach cancer Neg Hx    PE: BP 130/88 (BP Location: Right Arm, Patient Position: Sitting, Cuff Size: Normal)   Pulse 82   Ht  (1.702 m)   Wt 181 lb 9.6 oz (82.4 kg)   SpO2 98%   BMI 28.44 kg/m  Wt Readings from Last 3 Encounters:  03/28/23 181 lb 9.6 oz (82.4 kg)  03/16/23 187 lb (84.8 kg)  01/04/23 187 lb (84.8 kg)   Constitutional: overweight, in NAD; had difficulty rising from the chair, had to be helped Eyes: no exophthalmos ENT: no thyromegaly, no cervical lymphadenopathy Cardiovascular: RRR, No MRG Respiratory: CTA B Musculoskeletal: no deformities except first to right toes amputated Skin: no rashes Neurological: no tremor with outstretched hands  ASSESSMENT: 1. DM2, insulin-dependent, uncontrolled, with complications - CAD - diastolic and systolic CHF - CVAs - CKD stage 3 - DR - peripheral neuropathy, s/p amputations 1st and 2nd R toes - recurring hypoglycemia -admitted with hypoglycemia induced encephalopathy on 03/10/2022 - glu of 40 and was unresponsive.  Admitted again with hypoglycemia at 46 on 03/31/2022.  2. HL  PLAN:  1. Patient with longstanding, uncontrolled, type 2 diabetes, previously on basal/bolus insulin regimen with recurrent episodes of hypoglycemia.  He was able to stop mealtime insulin when starting Ozempic.  At last visit, sugars were higher and per wife's report this was due to still drinking regular sodas, even in the middle of the night, when she was not observing him.  I strongly advised him not to buy sodas  or other sweet drinks.  Otherwise, we did not change the regimen.  HbA1c at that time was higher, at 7.4%. -At today's visit, while he had another HbA1c, of 8.5% in PCPs office 2 weeks ago.  HbA1c today (checked before I had this information), confirmed the higher HbA1c -Upon questioning, patient stopped drinking regular sodas but switched to drinking juice.  I discussed that he should not have any sweet drinks and stop juice immediately.  Reviewing his blood sugars at home, many of them are at goal or only slightly higher, but he has hyperglycemic spikes in the 200s and even 300 after juice.  Will not change the regimen for now but at next visit we can increase Ozempic if  still needed -I again recommended to restart on the CGM and sent a prescription to his pharmacy.  Wife would prefer to have a receiver as he does not have a smart phone. - I suggested to:  Patient Instructions  Please continue: - Lantus 12-14 units in a.m. - Ozempic 0.5 mg weekly in a.m.  NO SWEET DRINK! NO SODAS, JUICE, SWEET TEA!  Please return in 3 months.  - we checked his HbA1c: 8.5% (higher) - advised to check sugars at different times of the day - 1x a day, rotating check times - advised for yearly eye exams >> he is UTD - we checked a foot exam at last visit: poor circulation and poor sensation.  He has a history of transmetatarsal amputation -I recommended to establish care at [facility].  They saw Dr. Al Corpus in 11/2022. - return to clinic in 3 months  2. HL -Reviewed latest lipid panel available for review: All fractions abnormal: Lab Results  Component Value Date   CHOL 201 (H) 07/02/2021   HDL 36 (L) 07/02/2021   LDLCALC 120 (H) 07/02/2021   TRIG 225 (H) 07/02/2021   CHOLHDL 5.6 07/02/2021  -He continues on Crestor 40 mg daily without side effects -He had another lipid panel 2 weeks ago.  Wife will bring me the results at next visit.  Carlus Pavlov, MD PhD [facility] Endocrinology

## 2023-03-28 NOTE — Patient Instructions (Addendum)
Please continue: - Lantus 12-14 units in a.m. - Ozempic 0.5 mg weekly in a.m.  NO SWEET DRINK! NO SODAS, JUICE, SWEET TEA!  Please return in 3 months.

## 2023-03-29 ENCOUNTER — Other Ambulatory Visit: Payer: Self-pay

## 2023-03-30 ENCOUNTER — Other Ambulatory Visit: Payer: Self-pay

## 2023-04-07 ENCOUNTER — Encounter (HOSPITAL_COMMUNITY): Payer: Self-pay | Admitting: Ophthalmology

## 2023-04-07 NOTE — Progress Notes (Addendum)
PCP - Debria Garret, NP Cardiologist - Dr Shaune Pascal EP - Dr Sherryl Manges Endocrinology - Dr Carlus Pavlov  Chest x-ray - 08/01/22 (1V) EKG - 08/01/22 Stress Test - 07/31/14 CE ECHO - 03/31/22 Cardiac Cath - 01/31/17  ICD Pacemaker - Medtronic Rep Tobie Lords notified of date of surgery with surgery start time and type of surgical procedure.  Initiated [facility] Device orders on 04/07/23.  Awaiting fax.  Last device check was on 01/23/23.  Email sent to [employer] and Frederich Balding to notify surgery date, and time.   Sleep Study -  n/a CPAP - none  Diabetes Type  2      THE MORNING OF SURGERY, take 6-7 units of Lantus Insulin.   If your blood sugar is less than 70 mg/dL, you will need to treat for low blood sugar: Treat a low blood sugar (less than 70 mg/dL) with  cup of clear juice (cranberry or apple), 4 glucose tablets, OR glucose gel. Recheck blood sugar in 15 minutes after treatment (to make sure it is greater than 70 mg/dL). If your blood sugar is not greater than 70 mg/dL on recheck, call [phone] for further instructions.  Takes Semaglutide on Sunday.  Last dose was on 04/02/23.  Patient will not take the Sunday, 04/09/23 dose.  ERAS: Clear liquids til 1 PM DOS.  Anesthesia review: Yes  STOP now taking any Aspirin (unless otherwise instructed by your surgeon), Aleve, Naproxen, Ibuprofen, Motrin, Advil, Goody's, BC's, all herbal medications, fish oil, and all vitamins.   Coronavirus Screening Does the patient have any of the following symptoms:  Cough yes/no: No Fever (>100.81F)  yes/no: No Runny nose yes/no: No Sore throat yes/no: No Difficulty breathing/shortness of breath  yes/no: No  Has the patient traveled in the last 14 days and where? yes/no: No  Wife Britta Mccreedy verbalized understanding of instructions that were given via phone.

## 2023-04-10 ENCOUNTER — Encounter: Payer: Self-pay | Admitting: Internal Medicine

## 2023-04-10 NOTE — Anesthesia Preprocedure Evaluation (Addendum)
Anesthesia Evaluation  Patient identified by MRN, date of birth, ID band Patient awake  General Assessment Comment:History includesnever smoker,DM2,HTN,HLD,CAD (non-obstructive, 2018), chronic systolic CHF (diagnosed 2018; Barostim Neo device implant 11/26/21),nonischemic cardiomyopathy, ICD(Medtronic [id number] Visia AF MRI VR ICD 01/15/18),dyspnea, CKD (stage 3), anemia,  Reviewed: Allergy & Precautions, H&P , NPO status , [name] Chart, lab work & pertinent test results  Airway Mallampati: II  TM Distance: >3 FB Neck ROM: Full    Dental no notable dental hx.    Pulmonary neg pulmonary ROS   Pulmonary exam normal breath sounds clear to auscultation       Cardiovascular hypertension, + CAD, + Past MI and +CHF  Normal cardiovascular exam+ Cardiac Defibrillator  Rhythm:Regular Rate:Normal  Although EP 1 year follow-up is scheduled in about 1 month, his last visit was on 03/09/22--although had remote interrogation within the past 90 days. He had seen primary cardiology within the year, but was just over a year from seeing ED, so initially PAT RN staff had reported that EP clinic would not complete perioperative ICD management recommendations, so RN had notified Dr. Allena Katz of this. Diagnosis of right eye retinal detachment would suggest case is time sensitive, so if case felt urgent by Dr. Allena Katz, then I had anticipated that the Medtronic Rep may need to interrogate the device on the day of surgery if recommendations were not provided. However in the interim, we received the following from EP: Device Information: Clinic EP Physician:  Sherryl Manges, MD   Device Type:  Defibrillator Manufacturer and Phone #:  Medtronic: [phone] Pacemaker Dependent?:  No. Date of Last Device Check:  01/23/2023       Normal Device Function?:  Yes.     Electrophysiologist's Recommendations:  Have magnet available.  Provide continuous ECG monitoring  when magnet is used or reprogramming is to be performed.   Procedure may interfere with device function.  Magnet should be placed over device during procedure.      Neuro/Psych negative neurological ROS  negative psych ROS   GI/Hepatic negative GI ROS, Neg liver ROS,,,  Endo/Other  diabetes    Renal/GU Renal InsufficiencyRenal disease  negative genitourinary   Musculoskeletal negative musculoskeletal ROS (+)    Abdominal   Peds negative pediatric ROS (+)  Hematology negative hematology ROS (+)   Anesthesia Other Findings   Reproductive/Obstetrics negative OB ROS                             Anesthesia Physical Anesthesia Plan  ASA: 4  Anesthesia Plan: MAC   Post-op Pain Management: Minimal or no pain anticipated   Induction: Intravenous  PONV Risk Score and Plan: 1 and Propofol infusion and Treatment may vary due to age or medical condition  Airway Management Planned: Simple Face Mask  Additional Equipment:   Intra-op Plan:   Post-operative Plan:   Informed Consent: I have reviewed the patients History and Physical, chart, labs and discussed the procedure including the risks, benefits and alternatives for the proposed anesthesia with the patient or authorized representative who has indicated his/her understanding and acceptance.     Dental advisory given  Plan Discussed with: CRNA and Surgeon  Anesthesia Plan Comments: (PAT note written 04/10/2023 by Shonna Chock, PA-C.  )       Anesthesia Quick Evaluation

## 2023-04-10 NOTE — Progress Notes (Signed)
PERIOPERATIVE PRESCRIPTION FOR IMPLANTED CARDIAC DEVICE PROGRAMMING  Patient Information: Name:  Jeffrey [name]  DOB:  09-30-1966  MRN:  [id number]    Planned Procedure:  PARS PLANA VITRECTOMY WITH 25 GAUGE WITH ENDOLASER PAN RETINAL PHOTOCOAGULATION RIGHT EYE - Right  Surgeon:  Dr Carmela Rima  Date of Procedure:  04/11/23  Cautery will be used.  Position during surgery:  Supine   Please send documentation back to:  [facility] (Fax # [phone])   Device Information:  Clinic EP Physician:  Sherryl Manges, MD   Device Type:  Defibrillator Manufacturer and Phone #:  Medtronic: [phone] Pacemaker Dependent?:  No. Date of Last Device Check:  01/23/2023 Normal Device Function?:  Yes.    Electrophysiologist's Recommendations:  Have magnet available. Provide continuous ECG monitoring when magnet is used or reprogramming is to be performed.  Procedure may interfere with device function.  Magnet should be placed over device during procedure.  Per Device Clinic Standing Orders, Lenor Coffin, RN  1:18 PM 04/10/2023

## 2023-04-10 NOTE — Progress Notes (Signed)
Dr Allena Katz informed that patient was not cleared for surgery since he has not seen his EP physician within the 1 year period.  The patient will require an office visit before he can be cleared for surgery.  Informed Frederich Balding, RN.  Patient has a Research officer, political party.  Rep Tobie Lords was notified of the cancellation via email.  Called Dr Eliane Decree office to see if this was an emergent surgery case.  It is an emergent and Dr Allena Katz will proceed with surgery on Tuesday, 04/11/23.

## 2023-04-10 NOTE — Progress Notes (Addendum)
Anesthesia Chart Review: SAME DAY WORK-UP  Case: [id number] Date/Time: 04/11/23 1545   Procedure: PARS PLANA VITRECTOMY WITH 25 GAUGE WITH ENDOLASER PAN RETINAL PHOTOCOAGULATION RIGHT EYE (Right)   Anesthesia type: Monitor Anesthesia Care   Pre-op diagnosis: retinal detachment right eye   [facility] 08 / [facility]   Surgeons: Carmela Rima, MD       DISCUSSION:  Patient is a 57 year old male scheduled for the above procedure. He was scheduled for pars plana vitrectomy with endolaser pan retinal photocoagulation right eye on 02/09/23, but surgery ultimately cancelled for unclear reasons.    History includes never smoker, DM2, HTN, HLD, CAD (non-obstructive, 2018), chronic systolic CHF (diagnosed 2018; Barostim Neo device implant 11/26/21), nonischemic cardiomyopathy, ICD (Medtronic [id number] Visia AF MRI VR ICD 01/15/18), dyspnea, CKD (stage 3), anemia, diabetic foot ulcer (s/p right great and 2nd toe amputations 2017), eye surgery (right pars plana vitrectomy 07/14/20; repair hemorrhagic detached right retina 08/25/20, left pars plana vitrectomy 07/05/22; left pars plana vitrectomy, endolaser for retained silicone 10/24/22), back surgery. He had had recurrent falls in setting of poor vision (last admission 07/2022) and hypoglycemia (episodes improved by 06/28/22 endocrinology follow-up).       S/p screening colonoscopy on 03/16/23.  Last cardiology visit with Dr. Elease Hashimoto was on 04/12/22 and EP follow-up with Dr. Graciela Husbands on 03/09/22. S/p Barostim implant on 11/26/21. Follow-up echo on 03/31/22 showed EF improved to 50-55% (previously 30-35%). Counseled on decreasing salt intake. Diuretic management per nephrology. One year follow-up planned. He is currently scheduled for 1 year EP follow-up on 05/12/23 with Gifford Shave., PA-C.    Although EP 1 year follow-up is scheduled in about 1 month, his last visit was on 03/09/22--although had remote interrogation within the past 90 days. He had seen primary cardiology  within the year, but was just over a year from seeing ED, so initially PAT RN staff had reported that EP clinic would not complete perioperative ICD management recommendations, so RN had notified Dr. Allena Katz of this. Diagnosis of right eye retinal detachment would suggest case is time sensitive, so if case felt urgent by Dr. Allena Katz, then I had anticipated that the Medtronic Rep may need to interrogate the device on the day of surgery if recommendations were not provided. However in the interim, we received the following from EP: Device Information: Clinic EP Physician:  Sherryl Manges, MD   Device Type:  Defibrillator Manufacturer and Phone #:  Medtronic: [phone] Pacemaker Dependent?:  No. Date of Last Device Check:  01/23/2023       Normal Device Function?:  Yes.     Electrophysiologist's Recommendations: Have magnet available. Provide continuous ECG monitoring when magnet is used or reprogramming is to be performed.  Procedure may interfere with device function.  Magnet should be placed over device during procedure.    VS: Ht 5\' 7"  (1.702 m)   Wt 82.4 kg   BMI 28.45 kg/m  BP Readings from Last 3 Encounters:  03/28/23 130/88  03/16/23 137/77  01/04/23 (!) 199/98   Pulse Readings from Last 3 Encounters:  03/28/23 82  03/16/23 71  01/04/23 74     PROVIDERS: Estevan Oaks, NP is PCP  Sherryl Manges, MD is EP cardiologist Kristeen Miss, MD is primary cardiologist  Vallery Sa, MD is nephrologist Carlus Pavlov, MD is endocrinologist   LABS: Last lab results in [facility] include: Lab Results  Component Value Date   WBC 5.1 08/03/2022   HGB 10.2 (L) 08/03/2022   HCT  33.1 (L) 08/03/2022   PLT 243 08/03/2022   GLUCOSE 133 (H) 08/03/2022   ALT 20 07/24/2022   AST 20 07/24/2022   NA 138 08/03/2022   K 4.5 08/03/2022   CL 115 (H) 08/03/2022   CREATININE 2.33 (H) 08/03/2022   BUN 45 (H) 08/03/2022   CO2 20 (L) 08/03/2022   TSH 0.831 03/11/2022   HGBA1C 8.5 (A)  03/28/2023     OTHER: EEG 07/01/21: IMPRESSION: This study is within normal limits. No seizures or epileptiform discharges were seen throughout the recording.     IMAGES: 1V PCXR 08/01/22: FINDINGS: Left AICD remains in place, unchanged. Right stimulator battery pack noted, unchanged. Heart and mediastinal contours are within normal limits. No focal opacities or effusions. No acute bony abnormality. IMPRESSION: No active cardiopulmonary disease.   CT Head/Maxillofacial 08/01/22: IMPRESSION: No acute intracranial abnormality. Decreased soft tissue swelling. Fractures as seen previously.     EKG: 08/01/22: Sinus rhythm Borderline prolonged PR interval Nonspecific intraventricular conduction delay Artifact in lead(s) I II aVR aVF V1 Confirmed by Alvira Monday ([id number]) on 08/05/2022 12:24:27 AM     CV: Echo 03/31/22: IMPRESSIONS   1. Left ventricular ejection fraction, by estimation, is 50 to 55%. The  left ventricle has low normal function. The left ventricle has no regional  wall motion abnormalities. There is severe concentric left ventricular  hypertrophy. Left ventricular  diastolic parameters are consistent with Grade I diastolic dysfunction  (impaired relaxation).   2. Right ventricular systolic function is normal. The right ventricular  size is mildly enlarged. There is normal pulmonary artery systolic  pressure. The estimated right ventricular systolic pressure is 24.5 mmHg.   3. The mitral valve is grossly normal. No evidence of mitral valve  regurgitation. No evidence of mitral stenosis.   4. The aortic valve is abnormal. Aortic valve regurgitation is not  visualized. Aortic valve sclerosis is present, with no evidence of aortic  valve stenosis.  - Comparison(s): LVEF has improved from prior.  (Comparison: LVEF 20-25% 01/29/17 echo; 10-15% 01/31/17 cath; LVEF 20% dffuse hypokinesis, grade 2 DD, RV systolic functio nmoderately-severely reduced 01/14/18 echo; LVEF  30-35%, normal RVSF 06/13/20 echo; LVEF 30-35%, grade 2 DD, normal RVSF 03/30/21 echo; 07/01/21 LVEF 30-35%, LV global hypokinesis, grade 1 DD, normal RVSF)     US Carotid 03/30/21: Summary:  Right Carotid: Velocities in the right ICA are consistent with a 1-39% stenosis.  Left Carotid: Velocities in the left ICA are consistent with a 1-39% stenosis.  Vertebrals:  Bilateral vertebral arteries demonstrate antegrade flow.  Subclavians: Normal flow hemodynamics were seen in bilateral subclavian arteries.      Cardiac cath 01/31/17: 1st RPLB lesion, 60 %stenosed. Dist RCA lesion, 60 %stenosed. Prox RCA lesion, 55 %stenosed. Prox LAD lesion, 10 %stenosed. There is severe left ventricular systolic dysfunction. LV end diastolic pressure is mildly elevated. - Severe global LV dysfunction with an ejection fraction of 10-15%.  The pattern is one of a nonischemic cardiomyopathy. - Mild coronary obstructive disease with smooth 10% narrowing in the LAD; normal ramus intermediate, normal left circumflex; and RCA with 50-60% proximal stenosis and distal tapering of 60%.  Prior to giving rise to 3 small distal branches with 60% narrowing in a small inferior LV branch.  The patient's LV dysfunction is out of proportion to his CAD. - Initiation of medical therapy with carvedilol, spironolactone, and probable initiation of angiotension receptorblocker/neprilysin inhibition therapy.  Consider short-term life-vest prior to discharge to allow for possible medication induced improvement in  LV function. [S/p ICD 01/15/18]   Past Medical History:  Diagnosis Date   AICD (automatic cardioverter/defibrillator) present    Medtronic   AICD (automatic cardioverter/defibrillator) present    MDT Visia AF MRI   Anemia    CAD (coronary artery disease)    a. cath 01/31/17: 60% 1st RPLB, 60% dist RCA, 55% prox RCA, 10% pro LAD --> Rx TX.    CHF (congestive heart failure) (HCC)    Chronic systolic CHF (congestive heart failure)  (HCC) 01/28/2017   1. Echo 01/29/17:  EF 20-25, normal wall motion, mild LAE // 2. EF 10-15 by [facility] 01/2017    Diabetes mellitus without complication (HCC)    type 2   Diabetic foot infection (HCC) 03/2016   RT FOOT   Dyspnea    History of kidney stones    passed   HTN (hypertension)    Hyperlipidemia    Hypertension    Myocardial infarction [facility]), although reported, NICM at cath    NICM (nonischemic cardiomyopathy) (HCC) 02/15/2017   1. Mod non-obs CAD on LHC in 01/2017 - CAD does not explain cardiomyopathy   Renal disorder    stage 4    Past Surgical History:  Procedure Laterality Date   AIR/FLUID EXCHANGE Right 07/14/2020   Procedure: AIR/FLUID EXCHANGE;  Surgeon: Carmela Rima, MD;  Location: [facility] OR;  Service: Ophthalmology;  Laterality: Right;   AMPUTATION Right 04/01/2016   Procedure: Right Great Toe Amputation;  Surgeon: Nadara Mustard, MD;  Location: [facility] OR;  Service: Orthopedics;  Laterality: Right;   AMPUTATION Right 06/19/2016   Procedure: AMPUTATION SECOND TOE;  Surgeon: Eldred Manges, MD;  Location: [facility] OR;  Service: Orthopedics;  Laterality: Right;   BACK SURGERY     for abscess   CARDIAC CATHETERIZATION     EYE SURGERY     ICD IMPLANT N/A 01/15/2018   Procedure: ICD IMPLANT;  Surgeon: Duke Salvia, MD;  Location: [facility] INVASIVE CV LAB;  Service: Cardiovascular;  Laterality: N/A;   INJECTION OF SILICONE OIL Right 07/14/2020   Procedure: INJECTION OF SILICONE OIL;  Surgeon: Carmela Rima, MD;  Location: [facility] OR;  Service: Ophthalmology;  Laterality: Right;   INJECTION OF SILICONE OIL Right 08/25/2020   Procedure: INJECTION OF SILICONE OIL;  Surgeon: Carmela Rima, MD;  Location: [facility] OR;  Service: Ophthalmology;  Laterality: Right;   INJECTION OF SILICONE OIL Left 07/05/2022   Procedure: INJECTION OF SILICONE OIL;  Surgeon: Carmela Rima, MD;  Location: [facility] OR;  Service: Ophthalmology;  Laterality: Left;   INSERT / REPLACE / REMOVE PACEMAKER     MEMBRANE PEEL Right  08/25/2020   Procedure: MEMBRANE PEEL;  Surgeon: Carmela Rima, MD;  Location: [facility] OR;  Service: Ophthalmology;  Laterality: Right;   MEMBRANE PEEL Left 07/05/2022   Procedure: MEMBRANECTOMY;  Surgeon: Carmela Rima, MD;  Location: [facility] OR;  Service: Ophthalmology;  Laterality: Left;   PARS PLANA VITRECTOMY Right 07/14/2020   Procedure: PARS PLANA VITRECTOMY WITH 25 GAUGE, Membranetomy, drainage of subretinal fluid;  Surgeon: Carmela Rima, MD;  Location: [facility] OR;  Service: Ophthalmology;  Laterality: Right;   PARS PLANA VITRECTOMY Right 08/25/2020   Procedure: PARS PLANA VITRECTOMY WITH 25 GAUGE;  Surgeon: Carmela Rima, MD;  Location: [facility] OR;  Service: Ophthalmology;  Laterality: Right;   PARS PLANA VITRECTOMY Left 07/05/2022   Procedure: LEFT PARS PLANA VITRECTOMY WITH 25 GAUGE;  Surgeon: Carmela Rima, MD;  Location: [facility] OR;  Service: Ophthalmology;  Laterality: Left;   PARS PLANA VITRECTOMY Left  10/25/2022   Procedure: PARS PLANA VITRECTOMY WITH 25 GAUGE;  Surgeon: Carmela Rima, MD;  Location: [facility] OR;  Service: Ophthalmology;  Laterality: Left;   PHOTOCOAGULATION WITH LASER Right 07/14/2020   Procedure: PHOTOCOAGULATION WITH LASER;  Surgeon: Carmela Rima, MD;  Location[facility] OR;  Service: Ophthalmology;  Laterality: Right;   PHOTOCOAGULATION WITH LASER Right 08/25/2020   Procedure: PHOTOCOAGULATION WITH LASER;  Surgeon: Carmela Rima, MD;  Location: [facility] OR;  Service: Ophthalmology;  Laterality: Right;   PHOTOCOAGULATION WITH LASER Left 07/05/2022   Procedure: PHOTOCOAGULATION WITH LASER;  Surgeon: Carmela Rima, MD;  Location: [facility] OR;  Service: Ophthalmology;  Laterality: Left;   PHOTOCOAGULATION WITH LASER Left 10/25/2022   Procedure: PHOTOCOAGULATION WITH LASER;  Surgeon: Carmela Rima, MD;  Location: [facility] OR;  Service: Ophthalmology;  Laterality: Left;   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   Procedure: REPAIR OF HEMORRHAGIC DETACHMENT;  Surgeon: Carmela Rima, MD;  Location: [facility] OR;  Service: Ophthalmology;  Laterality: Right;   RIGHT/LEFT HEART CATH AND CORONARY ANGIOGRAPHY N/A 01/31/2017   Procedure: Right/Left Heart Cath and Coronary Angiography;  Surgeon: Lennette Bihari, MD;  Location: [facility] INVASIVE CV LAB;  Service: Cardiovascular;  Laterality: N/A;   SILICON OIL REMOVAL Right 08/25/2020   Procedure: SILICON OIL REMOVAL;  Surgeon: Carmela Rima, MD;  Location: [facility] OR;  Service: Ophthalmology;  Laterality: Right;   SILICON OIL REMOVAL Left 10/25/2022   Procedure: SILICON OIL REMOVAL;  Surgeon: Carmela Rima, MD;  Location: [facility] OR;  Service: Ophthalmology;  Laterality: Left;    MEDICATIONS: No current facility-administered medications for this encounter.    albuterol (VENTOLIN HFA) 108 (90 Base) MCG/ACT inhaler   carvedilol (COREG) 12.5 MG tablet   cetirizine (ZYRTEC) 10 MG tablet   ferrous sulfate 325 (65 FE) MG tablet   furosemide (LASIX) 40 MG tablet   gabapentin (NEURONTIN) 300 MG capsule   hydrALAZINE (APRESOLINE) 50 MG tablet   Insulin Glargine Solostar (LANTUS) 100 UNIT/ML Solostar Pen   isosorbide mononitrate (IMDUR) 30 MG 24 hr tablet   lactulose (CHRONULAC) 10 GM/15ML solution   losartan (COZAAR) 25 MG tablet   ofloxacin (OCUFLOX) 0.3 % ophthalmic solution   ondansetron (ZOFRAN) 4 MG tablet   prednisoLONE acetate (PRED FORTE) 1 % ophthalmic suspension   rosuvastatin (CRESTOR) 40 MG tablet   Semaglutide,0.25 or 0.5MG /DOS, 2 MG/3ML SOPN   tamsulosin (FLOMAX) 0.4 MG CAPS capsule   Vitamin D, Ergocalciferol, (DRISDOL) 1.25 MG (50000 UNIT) CAPS capsule   Accu-Chek Softclix Lancets lancets   Accu-Chek Softclix Lancets lancets   Blood Glucose Monitoring Suppl (ACCU-CHEK GUIDE) w/Device KIT   Blood Glucose Monitoring Suppl (ACCU-CHEK GUIDE) w/Device KIT   brimonidine (ALPHAGAN P) 0.1 % SOLN   Cholecalciferol (D3-50) 1.25 MG (50000 UT) capsule   Continuous Glucose Receiver (FREESTYLE LIBRE 2 READER) DEVI   Continuous  Glucose Sensor (FREESTYLE LIBRE 2 SENSOR) MISC   Elastic Bandages & Supports (T.E.D. KNEE LENGTH/S-LONG) MISC   glucose blood (ACCU-CHEK GUIDE) test strip   glucose blood test strip   Insulin Pen Needle (B-D UF III MINI PEN NEEDLES) 31G X 5 MM MISC   Insulin Pen Needle (BD PEN NEEDLE NANO U/F) 32G X 4 MM MISC   lisinopril (ZESTRIL) 10 MG tablet   losartan (COZAAR) 25 MG tablet   losartan (COZAAR) 50 MG tablet    Shonna Chock, PA-C Surgical Short Stay/Anesthesiology [facility] Phone [phone] [facility] Phone [phone] 04/10/2023 1:49 PM

## 2023-04-11 ENCOUNTER — Ambulatory Visit (HOSPITAL_BASED_OUTPATIENT_CLINIC_OR_DEPARTMENT_OTHER): Payer: 59 | Admitting: Vascular Surgery

## 2023-04-11 ENCOUNTER — Encounter (HOSPITAL_COMMUNITY): Payer: Self-pay | Admitting: Ophthalmology

## 2023-04-11 ENCOUNTER — Other Ambulatory Visit: Payer: Self-pay

## 2023-04-11 ENCOUNTER — Ambulatory Visit (HOSPITAL_COMMUNITY): Payer: 59 | Admitting: Vascular Surgery

## 2023-04-11 ENCOUNTER — Encounter (HOSPITAL_COMMUNITY)
Admission: RE | Disposition: A | Discharge status: Home / Self Care | Payer: Self-pay | Source: Home / Self Care | Attending: Ophthalmology

## 2023-04-11 ENCOUNTER — Ambulatory Visit (HOSPITAL_COMMUNITY)
Admission: RE | Admit: 2023-04-11 | Discharge: 2023-04-11 | Disposition: A | Discharge status: Home / Self Care | Payer: 59 | Attending: Ophthalmology | Admitting: Ophthalmology

## 2023-04-11 DIAGNOSIS — Z95810 Presence of automatic (implantable) cardiac defibrillator: Secondary | ICD-10-CM | POA: Diagnosis not present

## 2023-04-11 DIAGNOSIS — I428 Other cardiomyopathies: Secondary | ICD-10-CM | POA: Diagnosis not present

## 2023-04-11 DIAGNOSIS — E119 Type 2 diabetes mellitus without complications: Secondary | ICD-10-CM | POA: Insufficient documentation

## 2023-04-11 DIAGNOSIS — H3341 Traction detachment of retina, right eye: Secondary | ICD-10-CM | POA: Insufficient documentation

## 2023-04-11 DIAGNOSIS — I509 Heart failure, unspecified: Secondary | ICD-10-CM

## 2023-04-11 DIAGNOSIS — I5022 Chronic systolic (congestive) heart failure: Secondary | ICD-10-CM | POA: Insufficient documentation

## 2023-04-11 DIAGNOSIS — N183 Chronic kidney disease, stage 3 unspecified: Secondary | ICD-10-CM | POA: Diagnosis not present

## 2023-04-11 DIAGNOSIS — I2510 Atherosclerotic heart disease of native coronary artery without angina pectoris: Secondary | ICD-10-CM

## 2023-04-11 DIAGNOSIS — I252 Old myocardial infarction: Secondary | ICD-10-CM

## 2023-04-11 DIAGNOSIS — I110 Hypertensive heart disease with heart failure: Secondary | ICD-10-CM | POA: Diagnosis not present

## 2023-04-11 DIAGNOSIS — Z48810 Encounter for surgical aftercare following surgery on the sense organs: Secondary | ICD-10-CM | POA: Diagnosis not present

## 2023-04-11 DIAGNOSIS — I130 Hypertensive heart and chronic kidney disease with heart failure and stage 1 through stage 4 chronic kidney disease, or unspecified chronic kidney disease: Secondary | ICD-10-CM | POA: Insufficient documentation

## 2023-04-11 HISTORY — PX: AIR/FLUID EXCHANGE: SHX6494

## 2023-04-11 HISTORY — PX: SILICON OIL REMOVAL: SHX5305

## 2023-04-11 HISTORY — PX: PHOTOCOAGULATION WITH LASER: SHX6027

## 2023-04-11 HISTORY — PX: PARS PLANA VITRECTOMY: SHX2166

## 2023-04-11 LAB — CBC
HCT: 43.1 % (ref 39.0–52.0)
Hemoglobin: 13.3 g/dL (ref 13.0–17.0)
MCH: 25 pg — ABNORMAL LOW (ref 26.0–34.0)
MCHC: 30.9 g/dL (ref 30.0–36.0)
MCV: 81.2 fL (ref 80.0–100.0)
Platelets: 260 10*3/uL (ref 150–400)
RBC: 5.31 MIL/uL (ref 4.22–5.81)
RDW: 14.6 % (ref 11.5–15.5)
WBC: 5.3 10*3/uL (ref 4.0–10.5)
nRBC: 0 % (ref 0.0–0.2)

## 2023-04-11 LAB — BASIC METABOLIC PANEL
Anion gap: 15 (ref 5–15)
BUN: 36 mg/dL — ABNORMAL HIGH (ref 6–20)
CO2: 25 mmol/L (ref 22–32)
Calcium: 9.5 mg/dL (ref 8.9–10.3)
Chloride: 101 mmol/L (ref 98–111)
Creatinine, Ser: 2.73 mg/dL — ABNORMAL HIGH (ref 0.61–1.24)
GFR, Estimated: 26 mL/min — ABNORMAL LOW (ref >60)
Glucose, Bld: 181 mg/dL — ABNORMAL HIGH (ref 70–99)
Potassium: 3.1 mmol/L — ABNORMAL LOW (ref 3.5–5.1)
Sodium: 141 mmol/L (ref 135–145)

## 2023-04-11 LAB — GLUCOSE, CAPILLARY
Glucose-Capillary: 180 mg/dL — ABNORMAL HIGH (ref 70–99)
Glucose-Capillary: 121 mg/dL — ABNORMAL HIGH (ref 70–99)
Glucose-Capillary: 107 mg/dL — ABNORMAL HIGH (ref 70–99)

## 2023-04-11 SURGERY — PARS PLANA VITRECTOMY WITH 25 GAUGE
Anesthesia: Monitor Anesthesia Care | Site: Eye | Laterality: Right

## 2023-04-11 MED ORDER — BUPIVACAINE HCL (PF) 0.75 % IJ SOLN
INTRAMUSCULAR | Status: DC | PRN
  Administered 2023-04-11: 2.5 mL

## 2023-04-11 MED ORDER — ATROPINE SULFATE 1 % OP SOLN
OPHTHALMIC | Status: AC
  Filled 2023-04-11: qty 5

## 2023-04-11 MED ORDER — LIDOCAINE HCL 2 % IJ SOLN
INTRAMUSCULAR | Status: AC
  Filled 2023-04-11: qty 20

## 2023-04-11 MED ORDER — PROPOFOL 10 MG/ML IV BOLUS
INTRAVENOUS | Status: DC | PRN
  Administered 2023-04-11: 40 mg via INTRAVENOUS

## 2023-04-11 MED ORDER — FENTANYL CITRATE (PF) 100 MCG/2ML IJ SOLN: 25.0000 ug | INTRAMUSCULAR | Status: DC | PRN

## 2023-04-11 MED ORDER — SODIUM HYALURONATE 10 MG/ML IO SOLUTION
PREFILLED_SYRINGE | INTRAOCULAR | Status: DC | PRN
  Administered 2023-04-11: .85 mL via INTRAOCULAR

## 2023-04-11 MED ORDER — PROPOFOL 500 MG/50ML IV EMUL
INTRAVENOUS | Status: DC | PRN
  Administered 2023-04-11: 75 ug/kg/min via INTRAVENOUS

## 2023-04-11 MED ORDER — CEFAZOLIN SUBCONJUNCTIVAL INJECTION 100 MG/0.5 ML
100.0000 mg | INJECTION | SUBCONJUNCTIVAL | Status: AC
  Administered 2023-04-11: 100 mg via SUBCONJUNCTIVAL
  Filled 2023-04-11: qty 1

## 2023-04-11 MED ORDER — DEXAMETHASONE SODIUM PHOSPHATE 10 MG/ML IJ SOLN
INTRAMUSCULAR | Status: AC
  Filled 2023-04-11: qty 1

## 2023-04-11 MED ORDER — TROPICAMIDE 1 % OP SOLN
1.0000 [drp] | OPHTHALMIC | Status: AC | PRN
  Administered 2023-04-11 (×3): 1 [drp] via OPHTHALMIC
  Filled 2023-04-11: qty 15

## 2023-04-11 MED ORDER — CHLORHEXIDINE GLUCONATE 0.12 % MT SOLN
15.0000 mL | Freq: Once | OROMUCOSAL | Status: AC
  Administered 2023-04-11: 15 mL via OROMUCOSAL
  Filled 2023-04-11: qty 15

## 2023-04-11 MED ORDER — BRILLIANT BLUE G 0.025 % IO SOSY
0.5000 mL | PREFILLED_SYRINGE | INTRAOCULAR | Status: DC
  Filled 2023-04-11: qty 0.5

## 2023-04-11 MED ORDER — DEXAMETHASONE SODIUM PHOSPHATE 10 MG/ML IJ SOLN
INTRAMUSCULAR | Status: DC | PRN
  Administered 2023-04-11: 10 mg

## 2023-04-11 MED ORDER — BSS PLUS IO SOLN
INTRAOCULAR | Status: AC
  Filled 2023-04-11: qty 500

## 2023-04-11 MED ORDER — HYALURONIDASE HUMAN 150 UNIT/ML IJ SOLN
INTRAMUSCULAR | Status: AC
  Filled 2023-04-11: qty 1

## 2023-04-11 MED ORDER — BSS IO SOLN
INTRAOCULAR | Status: AC
  Filled 2023-04-11: qty 15

## 2023-04-11 MED ORDER — ONDANSETRON HCL 4 MG/2ML IJ SOLN
INTRAMUSCULAR | Status: DC | PRN
  Administered 2023-04-11: 4 mg via INTRAVENOUS

## 2023-04-11 MED ORDER — TOBRAMYCIN-DEXAMETHASONE 0.3-0.1 % OP OINT
TOPICAL_OINTMENT | OPHTHALMIC | Status: AC
  Filled 2023-04-11: qty 3.5

## 2023-04-11 MED ORDER — EPHEDRINE SULFATE-NACL 50-0.9 MG/10ML-% IV SOSY
PREFILLED_SYRINGE | INTRAVENOUS | Status: DC | PRN
  Administered 2023-04-11 (×3): 5 mg via INTRAVENOUS

## 2023-04-11 MED ORDER — ORAL CARE MOUTH RINSE: 15.0000 mL | Freq: Once | OROMUCOSAL | Status: AC

## 2023-04-11 MED ORDER — OFLOXACIN 0.3 % OP SOLN
1.0000 [drp] | OPHTHALMIC | Status: AC | PRN
  Administered 2023-04-11 (×3): 1 [drp] via OPHTHALMIC
  Filled 2023-04-11: qty 5

## 2023-04-11 MED ORDER — INSULIN ASPART 100 UNIT/ML IJ SOLN: 0.0000 [IU] | INTRAMUSCULAR | Status: DC | PRN

## 2023-04-11 MED ORDER — EPINEPHRINE PF 1 MG/ML IJ SOLN
INTRAMUSCULAR | Status: AC
  Filled 2023-04-11: qty 1

## 2023-04-11 MED ORDER — MIDAZOLAM HCL 2 MG/2ML IJ SOLN
INTRAMUSCULAR | Status: DC | PRN
  Administered 2023-04-11: 2 mg via INTRAVENOUS

## 2023-04-11 MED ORDER — EPINEPHRINE PF 1 MG/ML IJ SOLN
INTRAOCULAR | Status: DC | PRN
  Administered 2023-04-11: 500.3 mL

## 2023-04-11 MED ORDER — PHENYLEPHRINE HCL 2.5 % OP SOLN
1.0000 [drp] | OPHTHALMIC | Status: AC | PRN
  Administered 2023-04-11 (×3): 1 [drp] via OPHTHALMIC
  Filled 2023-04-11: qty 2

## 2023-04-11 MED ORDER — PROPARACAINE HCL 0.5 % OP SOLN
1.0000 [drp] | OPHTHALMIC | Status: AC | PRN
  Administered 2023-04-11 (×3): 1 [drp] via OPHTHALMIC
  Filled 2023-04-11: qty 15

## 2023-04-11 MED ORDER — LIDOCAINE HCL 2 % IJ SOLN
INTRAMUSCULAR | Status: DC | PRN
  Administered 2023-04-11: 2.5 mL

## 2023-04-11 MED ORDER — INSULIN ASPART 100 UNIT/ML IJ SOLN
INTRAMUSCULAR | Status: AC
  Administered 2023-04-11: 4 [IU] via SUBCUTANEOUS
  Filled 2023-04-11: qty 1

## 2023-04-11 MED ORDER — BUPIVACAINE HCL (PF) 0.75 % IJ SOLN
INTRAMUSCULAR | Status: AC
  Filled 2023-04-11: qty 10

## 2023-04-11 MED ORDER — BSS IO SOLN
INTRAOCULAR | Status: DC | PRN
  Administered 2023-04-11: 15 mL via INTRAOCULAR

## 2023-04-11 MED ORDER — TOBRAMYCIN-DEXAMETHASONE 0.3-0.1 % OP OINT
TOPICAL_OINTMENT | OPHTHALMIC | Status: DC | PRN
  Administered 2023-04-11: 1 via OPHTHALMIC

## 2023-04-11 MED ORDER — HYALURONIDASE HUMAN 150 UNIT/ML IJ SOLN
INTRAMUSCULAR | Status: DC | PRN
  Administered 2023-04-11: 150 [IU] via SUBCUTANEOUS

## 2023-04-11 MED ORDER — SODIUM HYALURONATE 10 MG/ML IO SOLUTION
PREFILLED_SYRINGE | INTRAOCULAR | Status: AC
  Filled 2023-04-11: qty 0.85

## 2023-04-11 MED ORDER — ONDANSETRON HCL 4 MG/2ML IJ SOLN: 4.0000 mg | Freq: Once | INTRAMUSCULAR | Status: DC | PRN

## 2023-04-11 MED ORDER — MIDAZOLAM HCL 2 MG/2ML IJ SOLN
INTRAMUSCULAR | Status: AC
  Filled 2023-04-11: qty 2

## 2023-04-11 MED ORDER — DEXAMETHASONE SODIUM PHOSPHATE 10 MG/ML IJ SOLN
INTRAMUSCULAR | Status: DC | PRN
  Administered 2023-04-11: 5 mg via INTRAVENOUS

## 2023-04-11 MED ORDER — SODIUM CHLORIDE 0.9 % IV SOLN: INTRAVENOUS | Status: DC

## 2023-04-11 SURGICAL SUPPLY — 55 items
APL SWBSTK 6 STRL LF DISP (MISCELLANEOUS) ×1
APPLICATOR COTTON TIP 6 STRL (MISCELLANEOUS) ×2 IMPLANT
APPLICATOR COTTON TIP 6IN STRL (MISCELLANEOUS) ×1
BAND WRIST GAS GREEN (MISCELLANEOUS) IMPLANT
BLADE STAB KNIFE 15DEG (BLADE) IMPLANT
BNDG EYE OVAL 2 1/8 X 2 5/8 (GAUZE/BANDAGES/DRESSINGS) IMPLANT
CANNULA ANT CHAM MAIN (OPHTHALMIC RELATED) IMPLANT
CANNULA VLV SOFT TIP 25G (OPHTHALMIC) ×2 IMPLANT
CANNULA VLV SOFT TIP 25GA (OPHTHALMIC) ×1 IMPLANT
CAUTERY EYE LOW TEMP 1300F FIN (OPHTHALMIC RELATED) IMPLANT
CLSR STERI-STRIP ANTIMIC 1/2X4 (GAUZE/BANDAGES/DRESSINGS) ×2 IMPLANT
DRAPE HALF SHEET 40X57 (DRAPES) ×2 IMPLANT
DRAPE INCISE 51X51 W/FILM STRL (DRAPES) IMPLANT
DRAPE RETRACTOR (MISCELLANEOUS) ×2 IMPLANT
FORCEPS ECKARDT ILM 25G SERR (OPHTHALMIC RELATED) IMPLANT
FORCEPS GRIESHABER ILM 25G A (INSTRUMENTS) IMPLANT
GAS AUTO FILL CONSTEL (OPHTHALMIC)
GAS AUTO FILL CONSTELLATION (OPHTHALMIC) IMPLANT
GAS WRIST BAND GREEN (MISCELLANEOUS)
GLOVE SURG SYN 7.5  E (GLOVE) ×1
GLOVE SURG SYN 7.5 E (GLOVE) ×1 IMPLANT
GLOVE SURG SYN 7.5 PF PI (GLOVE) ×2 IMPLANT
GOWN STRL REUS W/ TWL LRG LVL3 (GOWN DISPOSABLE) ×2 IMPLANT
GOWN STRL REUS W/TWL LRG LVL3 (GOWN DISPOSABLE) ×1
KIT BASIN OR (CUSTOM PROCEDURE TRAY) ×2 IMPLANT
KIT TURNOVER KIT B (KITS) ×2 IMPLANT
LENS BIOM SUPER VIEW SET DISP (MISCELLANEOUS) ×2 IMPLANT
MICROPICK 25G (MISCELLANEOUS)
NDL 18GX1X1/2 (RX/OR ONLY) (NEEDLE) ×2 IMPLANT
NDL 25GX 5/8IN NON SAFETY (NEEDLE) ×2 IMPLANT
NDL 27GX1/2 REG BEVEL ECLIP (NEEDLE) ×2 IMPLANT
NDL FILTER BLUNT 18X1 1/2 (NEEDLE) ×2 IMPLANT
NDL HYPO 30X.5 LL (NEEDLE) ×4 IMPLANT
NDL RETROBULBAR 25GX1.5 (NEEDLE) ×2 IMPLANT
NEEDLE 18GX1X1/2 (RX/OR ONLY) (NEEDLE) ×1 IMPLANT
NEEDLE 25GX 5/8IN NON SAFETY (NEEDLE) ×1 IMPLANT
NEEDLE 27GX1/2 REG BEVEL ECLIP (NEEDLE) ×1 IMPLANT
NEEDLE FILTER BLUNT 18X1 1/2 (NEEDLE) ×1 IMPLANT
NEEDLE HYPO 30X.5 LL (NEEDLE) ×3 IMPLANT
NEEDLE RETROBULBAR 25GX1.5 (NEEDLE) ×1 IMPLANT
PACK FRAGMATOME (OPHTHALMIC) IMPLANT
PACK VITRECTOMY CUSTOM (CUSTOM PROCEDURE TRAY) ×2 IMPLANT
PAD ARMBOARD 7.5X6 YLW CONV (MISCELLANEOUS) ×4 IMPLANT
PAK PIK VITRECTOMY CVS 25GA (OPHTHALMIC) ×2 IMPLANT
PICK MICROPICK 25G (MISCELLANEOUS) IMPLANT
PROBE ENDO DIATHERMY 25G (MISCELLANEOUS) ×2 IMPLANT
PROBE LASER ILLUM FLEX CVD 25G (OPHTHALMIC) IMPLANT
SCRAPER DIAMOND 25GA (OPHTHALMIC RELATED) IMPLANT
SET INJECTOR OIL FLUID CONSTEL (OPHTHALMIC) IMPLANT
SHIELD EYE LENSE ONLY DISP (GAUZE/BANDAGES/DRESSINGS) IMPLANT
SOL ANTI FOG 6CC (MISCELLANEOUS) ×2 IMPLANT
SUT VICRYL 7 0 TG140 8 (SUTURE) ×2 IMPLANT
SUT VICRYL 8 0 TG140 8 (SUTURE) IMPLANT
SYR TB 1ML LUER SLIP (SYRINGE) IMPLANT
WATER STERILE IRR 1000ML POUR (IV SOLUTION) ×2 IMPLANT

## 2023-04-11 NOTE — H&P (Signed)
Date of examination:  04/11/2023  Indication for surgery: Retained silicone oil and tractional retinal detachment right eye  Pertinent past medical history:  Past Medical History:  Diagnosis Date   AICD (automatic cardioverter/defibrillator) present    Medtronic   AICD (automatic cardioverter/defibrillator) present    MDT Visia AF MRI   Anemia    CAD (coronary artery disease)    a. cath 01/31/17: 60% 1st RPLB, 60% dist RCA, 55% prox RCA, 10% pro LAD --> Rx TX.    CHF (congestive heart failure) (HCC)    Chronic systolic CHF (congestive heart failure) (HCC) 01/28/2017   1. Echo 01/29/17:  EF 20-25, normal wall motion, mild LAE // 2. EF 10-15 by [facility] 01/2017    Diabetes mellitus without complication (HCC)    type 2   Diabetic foot infection (HCC) 03/2016   RT FOOT   Dyspnea    History of kidney stones    passed   HTN (hypertension)    Hyperlipidemia    Hypertension    Myocardial infarction [facility]), although reported, NICM at cath    NICM (nonischemic cardiomyopathy) (HCC) 02/15/2017   1. Mod non-obs CAD on LHC in 01/2017 - CAD does not explain cardiomyopathy   Renal disorder    stage 4    Pertinent ocular history:  proliferative diabetic retinopathy right eye  Pertinent family history:  Family History  Problem Relation Age of Onset   Diabetes Mother    Hypertension Mother    Diabetes Father    Heart attack Father    Diabetes Sister    Heart attack Maternal Grandmother    Colon cancer Neg Hx    Colon polyps Neg Hx    Esophageal cancer Neg Hx    Rectal cancer Neg Hx    Stomach cancer Neg Hx     General:  Healthy appearing patient in no distress.     Eyes:    Acuity OD 20/600  External: Within normal limits      Anterior segment: Within normal limits          Fundus: retained silicone oil, attenuated vessels, gliosis OD      Impression: Retained silicone oil and tractional detachment of retina right eye  Plan:  Vitrectomy, endolaser and possible gas tamponade right  eye  Carmela Rima, MD

## 2023-04-11 NOTE — Anesthesia Postprocedure Evaluation (Signed)
Anesthesia Post Note  Patient: NEVYN BOSSMAN  Procedure(s) Performed: PARS PLANA VITRECTOMY WITH 25 GAUGE (Right: Eye) PHOTOCOAGULATION WITH LASER (Right: Eye) SILICON OIL REMOVAL (Right: Eye) AIR/FLUID EXCHANGE (Right: Eye)     Patient location during evaluation: PACU Anesthesia Type: MAC Level of consciousness: awake and alert Pain management: pain level controlled Vital Signs Assessment: post-procedure vital signs reviewed and stable Respiratory status: spontaneous breathing, nonlabored ventilation and respiratory function stable Cardiovascular status: stable and blood pressure returned to baseline Postop Assessment: no apparent nausea or vomiting Anesthetic complications: no  No notable events documented.  Last Vitals:  Vitals:   04/11/23 1850 04/11/23 1905  BP: 118/83 (!) 141/91  Pulse: 72 64  Resp: 18 19  Temp:  37.1 C  SpO2: 95% 95%    Last Pain:  Vitals:   04/11/23 1905  TempSrc:   PainSc: 0-No pain                 Jourdon Zimmerle,W. EDMOND

## 2023-04-11 NOTE — Brief Op Note (Signed)
04/11/2023  6:26 PM  PATIENT:  Renette Butters  57 y.o. male  PRE-OPERATIVE DIAGNOSIS:  retained silicone oil and tractional retinal detachment right eye  POST-OPERATIVE DIAGNOSIS:  retained silicone oil and tractional retinal detachment right eye  PROCEDURE:  Procedure(s): PARS PLANA VITRECTOMY WITH 25 GAUGE (Right) PHOTOCOAGULATION WITH LASER (Right) SILICON OIL REMOVAL (Right)  SURGEON:  Surgeon(s) and Role:    Carmela Rima, MD - Primary  PHYSICIAN ASSISTANT:   ASSISTANTS: none   ANESTHESIA:   local and MAC  EBL:  minimal  BLOOD ADMINISTERED:none  DRAINS: none   LOCAL MEDICATIONS USED:  MARCAINE    and LIDOCAINE   SPECIMEN:  No Specimen  DISPOSITION OF SPECIMEN:  N/A  COUNTS:  YES  TOURNIQUET:  * No tourniquets in log *  DICTATION: .Note written in EPIC  PLAN OF CARE: Discharge to home after PACU  PATIENT DISPOSITION:  PACU - hemodynamically stable.   Delay start of Pharmacological VTE agent (>24hrs) due to surgical blood loss or risk of bleeding: not applicable

## 2023-04-11 NOTE — Discharge Instructions (Addendum)
DO NOT SLEEP ON BACK, THE EYE PRESSURE CAN GO UP AND CAUSE VISION LOSS   SLEEP ON SIDE WITH NOSE TO PILLOW  DURING DAY KEEP UPRIGHT 

## 2023-04-11 NOTE — Anesthesia Procedure Notes (Signed)
Procedure Name: MAC Date/Time: 04/11/2023 5:25 PM  Performed by: Tressia Miners, CRNAPre-anesthesia Checklist: Patient identified, Emergency Drugs available, Suction available, Patient being monitored and Timeout performed Patient Re-evaluated:Patient Re-evaluated prior to induction Oxygen Delivery Method: Nasal cannula Preoxygenation: Pre-oxygenation with 100% oxygen

## 2023-04-11 NOTE — Transfer of Care (Signed)
Immediate Anesthesia Transfer of Care Note  Patient: Jeffrey [name]  Procedure(s) Performed: PARS PLANA VITRECTOMY WITH 25 GAUGE (Right: Eye) PHOTOCOAGULATION WITH LASER (Right: Eye) SILICON OIL REMOVAL (Right: Eye) AIR/FLUID EXCHANGE (Right: Eye)  Patient Location: PACU  Anesthesia Type:MAC  Level of Consciousness: drowsy  Airway & Oxygen Therapy: Patient Spontanous Breathing and Patient connected to nasal cannula oxygen  Post-op Assessment: Report given to RN and Post -op Vital signs reviewed and stable  Post vital signs: Reviewed and stable  Last Vitals:  Vitals Value Taken Time  BP 109/75 04/11/23 1834  Temp    Pulse 66 04/11/23 1838  Resp 16 04/11/23 1838  SpO2 97 % 04/11/23 1838  Vitals shown include unvalidated device data.  Last Pain:  Vitals:   04/11/23 1423  TempSrc:   PainSc: 0-No pain         Complications: No notable events documented.

## 2023-04-12 ENCOUNTER — Encounter (HOSPITAL_COMMUNITY): Payer: Self-pay | Admitting: Ophthalmology

## 2023-04-12 ENCOUNTER — Other Ambulatory Visit: Payer: Self-pay

## 2023-04-12 MED ORDER — PREDNISOLONE ACETATE 1 % OP SUSP
OPHTHALMIC | 0 refills | Status: AC
  Filled 2023-04-12: qty 5, 28d supply, fill #0
  Filled 2023-05-05: qty 5, 28d supply, fill #1

## 2023-04-12 MED ORDER — ACETAZOLAMIDE ER 500 MG PO CP12
500.0000 mg | ORAL_CAPSULE | Freq: Every day | ORAL | 0 refills | Status: DC
  Filled 2023-04-12: qty 30, 30d supply, fill #0

## 2023-04-12 MED ORDER — OFLOXACIN 0.3 % OP SOLN
1.0000 [drp] | Freq: Four times a day (QID) | OPHTHALMIC | 0 refills | Status: AC
  Filled 2023-04-12: qty 5, 25d supply, fill #0

## 2023-04-14 ENCOUNTER — Other Ambulatory Visit: Payer: Self-pay

## 2023-04-15 NOTE — Op Note (Signed)
NHIA HEYMANN 04/11/2023 Diagnosis: Retained silicone oil right eye  Procedure: Pars Plana Vitrectomy with silicone oil removal from posterior chamber, Endolaser, and anterior chamber washout with silicone oil removal of anterior chamber Operative Eye:  right eye  Surgeon: Harrold Donath Estimated Blood Loss: minimal Specimens for Pathology:  None Complications: none   The  patient was prepped and draped in the usual fashion for ocular surgery on the  right eye .  A lid speculum was placed.  Infusion line and trocar was placed at the 8 o'clock position approximately 3.5 mm from the surgical limbus.   The infusion line was allowed to run and then clamped when placed at the cannula opening. The line was inserted and secured to the drape with an adhesive strip.   Active trocars/cannula were placed at the 10 and 2 o'clock positions approximately 3.5 mm from the surgical limbus. The cannula was visualized in the vitreous cavity.  The light pipe and viscous fluid extraction unit was used to remove silicone oil.  The vitrector was then used to remove any remaining silicone oil.  The oil in the anterior chamber was washed out through a side port incision using BSS and later the vitrector.     3 rows of endolaser were applied 360 degrees to the periphery.  A partial air-fluid exchange was performed.  The superior cannulas were sequentially removed with concommitant tamponade using a cotton tipped applicator and noted to be air tight.  The infusion line and trocar were removed and the sclerotomy was noted to be air tight with normal intraocular pressure by digital palpapation.  Subconjunctival injections of  antibiotic and Dexamethasone 4mg /70ml were placed in the infero-medial quadrant.   The speculum and drapes were removed and the eye was patched with Polymixin/Bacitracin ophthalmic ointment. An eye shield was placed and the patient was transferred alert and conversant with stable vital signs  to the post operative recovery area.  The patient tolerated the procedure well and no complications were noted.  Harrold Donath MD

## 2023-04-21 ENCOUNTER — Ambulatory Visit (INDEPENDENT_AMBULATORY_CARE_PROVIDER_SITE_OTHER): Payer: 59

## 2023-04-21 DIAGNOSIS — I428 Other cardiomyopathies: Secondary | ICD-10-CM | POA: Diagnosis not present

## 2023-04-21 LAB — CUP PACEART REMOTE DEVICE CHECK
Battery Remaining Longevity: 61 mo
Battery Voltage: 2.98 V
Brady Statistic RV Percent Paced: 0.01 %
Date Time Interrogation Session: 20240510022826
HighPow Impedance: 54 Ohm
Implantable Lead Connection Status: 753985
Implantable Lead Implant Date: 20190204
Implantable Lead Location: 753860
Implantable Pulse Generator Implant Date: 20190204
Lead Channel Impedance Value: 323 Ohm
Lead Channel Impedance Value: 380 Ohm
Lead Channel Pacing Threshold Amplitude: 1.375 V
Lead Channel Pacing Threshold Pulse Width: 0.4 ms
Lead Channel Sensing Intrinsic Amplitude: 11.75 mV
Lead Channel Sensing Intrinsic Amplitude: 11.75 mV
Lead Channel Setting Pacing Amplitude: 2.75 V
Lead Channel Setting Pacing Pulse Width: 0.4 ms
Lead Channel Setting Sensing Sensitivity: 0.3 mV
Zone Setting Status: 755011
Zone Setting Status: 755011

## 2023-04-24 ENCOUNTER — Other Ambulatory Visit: Payer: Self-pay

## 2023-04-26 ENCOUNTER — Other Ambulatory Visit: Payer: Self-pay

## 2023-04-28 ENCOUNTER — Other Ambulatory Visit: Payer: Self-pay

## 2023-05-01 ENCOUNTER — Other Ambulatory Visit: Payer: Self-pay

## 2023-05-02 ENCOUNTER — Other Ambulatory Visit: Payer: Self-pay

## 2023-05-02 ENCOUNTER — Encounter: Payer: Self-pay | Admitting: Nurse Practitioner

## 2023-05-02 MED ORDER — ELIQUIS 5 MG PO TABS
5.0000 mg | ORAL_TABLET | Freq: Two times a day (BID) | ORAL | 3 refills | Status: DC
  Filled 2023-05-02 – 2023-05-10 (×2): qty 180, 90d supply, fill #0
  Filled 2023-08-01 – 2023-08-09 (×2): qty 180, 90d supply, fill #1
  Filled 2023-10-31: qty 180, 90d supply, fill #2
  Filled 2024-02-01: qty 180, 90d supply, fill #3

## 2023-05-03 ENCOUNTER — Other Ambulatory Visit: Payer: Self-pay

## 2023-05-05 ENCOUNTER — Other Ambulatory Visit: Payer: Self-pay

## 2023-05-08 ENCOUNTER — Other Ambulatory Visit: Payer: Self-pay

## 2023-05-09 ENCOUNTER — Other Ambulatory Visit: Payer: Self-pay

## 2023-05-09 NOTE — Progress Notes (Signed)
Remote ICD transmission.   

## 2023-05-10 ENCOUNTER — Other Ambulatory Visit: Payer: Self-pay

## 2023-05-12 ENCOUNTER — Other Ambulatory Visit: Payer: Self-pay

## 2023-05-12 ENCOUNTER — Ambulatory Visit: Payer: 59 | Admitting: Student

## 2023-05-12 MED ORDER — TAMSULOSIN HCL 0.4 MG PO CAPS
ORAL_CAPSULE | ORAL | 2 refills | Status: DC
  Filled 2023-05-12: qty 90, 90d supply, fill #0
  Filled 2023-08-08: qty 90, 90d supply, fill #1
  Filled 2023-11-06: qty 90, 90d supply, fill #2

## 2023-05-15 ENCOUNTER — Other Ambulatory Visit: Payer: Self-pay

## 2023-05-16 ENCOUNTER — Other Ambulatory Visit: Payer: Self-pay

## 2023-05-16 ENCOUNTER — Encounter: Payer: Self-pay | Admitting: Neurology

## 2023-05-16 ENCOUNTER — Ambulatory Visit: Payer: 59 | Admitting: Neurology

## 2023-05-17 ENCOUNTER — Other Ambulatory Visit: Payer: Self-pay

## 2023-05-25 ENCOUNTER — Other Ambulatory Visit: Payer: Self-pay

## 2023-05-31 ENCOUNTER — Other Ambulatory Visit: Payer: Self-pay

## 2023-06-01 ENCOUNTER — Other Ambulatory Visit: Payer: Self-pay

## 2023-06-02 ENCOUNTER — Other Ambulatory Visit: Payer: Self-pay

## 2023-06-05 ENCOUNTER — Other Ambulatory Visit: Payer: Self-pay

## 2023-06-06 ENCOUNTER — Other Ambulatory Visit: Payer: Self-pay

## 2023-06-07 ENCOUNTER — Ambulatory Visit: Payer: 59 | Attending: Student | Admitting: Student

## 2023-06-07 ENCOUNTER — Other Ambulatory Visit (HOSPITAL_BASED_OUTPATIENT_CLINIC_OR_DEPARTMENT_OTHER): Payer: Self-pay

## 2023-06-07 ENCOUNTER — Encounter: Payer: Self-pay | Admitting: Student

## 2023-06-07 ENCOUNTER — Other Ambulatory Visit: Payer: Self-pay

## 2023-06-07 DIAGNOSIS — G4730 Sleep apnea, unspecified: Secondary | ICD-10-CM

## 2023-06-07 MED ORDER — ACETAZOLAMIDE ER 500 MG PO CP12
500.0000 mg | ORAL_CAPSULE | Freq: Every day | ORAL | 1 refills | Status: AC
  Filled 2023-06-07: qty 30, 30d supply, fill #0
  Filled 2023-07-06: qty 30, 30d supply, fill #1
  Filled 2023-08-07: qty 30, 30d supply, fill #2
  Filled 2023-09-04: qty 30, 30d supply, fill #3

## 2023-06-12 ENCOUNTER — Other Ambulatory Visit: Payer: Self-pay

## 2023-06-13 ENCOUNTER — Other Ambulatory Visit: Payer: Self-pay

## 2023-06-14 ENCOUNTER — Other Ambulatory Visit: Payer: Self-pay

## 2023-06-16 ENCOUNTER — Other Ambulatory Visit: Payer: Self-pay

## 2023-06-16 MED ORDER — GABAPENTIN 300 MG PO CAPS
600.0000 mg | ORAL_CAPSULE | Freq: Every day | ORAL | 2 refills | Status: DC
  Filled 2023-06-16 – 2023-07-06 (×2): qty 180, 90d supply, fill #0
  Filled 2023-11-13: qty 180, 90d supply, fill #1
  Filled 2024-02-19: qty 180, 90d supply, fill #2

## 2023-06-23 ENCOUNTER — Other Ambulatory Visit: Payer: Self-pay

## 2023-06-26 ENCOUNTER — Other Ambulatory Visit: Payer: Self-pay

## 2023-06-27 ENCOUNTER — Other Ambulatory Visit: Payer: Self-pay

## 2023-06-29 ENCOUNTER — Ambulatory Visit (HOSPITAL_BASED_OUTPATIENT_CLINIC_OR_DEPARTMENT_OTHER): Payer: 59 | Admitting: Internal Medicine

## 2023-06-29 ENCOUNTER — Other Ambulatory Visit: Payer: Self-pay

## 2023-07-04 ENCOUNTER — Ambulatory Visit: Payer: 59 | Admitting: Internal Medicine

## 2023-07-04 NOTE — Progress Notes (Deleted)
Patient ID: Jeffrey [name], male   DOB: 1966/06/09, 57 y.o.   MRN: [id number]  HPI: Jeffrey [name] is a 57 y.o.-year-old male, returning for follow-up for DM2, dx in ~2013, insulin-dependent  since dx., uncontrolled, with long-term complications (CAD, diastolic and systolic CHF, CVAs, CKD stage 4, peripheral neuropathy - s/p 2 R toe amputations, DR, recurring hypoglycemia).  He is here with his wife who offers most of the information about his diabetes history, other medical conditions, diet, and activity, along with blood sugars and insulin doses.  Patient is somnolent and dozes off during the appointment.  Last visit with me 3 months ago.  Interim history: No increased urination, blurry vision, nausea, chest pain.  He continues to have disequilibrium and walks supported by his wife. At last visit, he was still having regular sodas even in middle of the night.  Sugars were much higher afterwards.  At last visit, wife mentioned that he stopped drinking regular sodas, however, he is now drinking juice!   Reviewed HbA1c: 03/14/2023: HbA1c 8.5% reportedly, in PCPs office Lab Results  Component Value Date   HGBA1C 8.5 (A) 03/28/2023   HGBA1C 7.4 (A) 11/02/2022   HGBA1C 6.8 (A) 06/28/2022   HGBA1C 9.7 (H) 03/11/2022   HGBA1C 7.6 (A) 10/20/2021   HGBA1C 14.2 (H) 06/30/2021   HGBA1C 10.7 (A) 01/13/2021   HGBA1C 15.4 (H) 06/13/2020   HGBA1C 9.9 (H) 04/25/2019   HGBA1C 7.2 (A) 10/24/2018   HGBA1C >15.5 (H) 06/22/2018   HGBA1C 13.2 03/21/2018   HGBA1C 13.8 12/20/2017   HGBA1C 11.8 (H) 01/29/2017   HGBA1C 12.4 (H) 09/12/2016   HGBA1C 13.0 (H) 06/18/2016   HGBA1C 13.0 (H) 06/17/2016   HGBA1C 13.7 02/24/2016   HGBA1C 13.4 (H) 09/28/2015   HGBA1C 13.4 (H) 09/25/2015   HGBA1C 13.90 06/04/2015   HGBA1C 9.60 03/04/2015   HGBA1C 12.9 11/12/2014   HGBA1C 9.9 08/26/2014   HGBA1C >14% 04/21/2014   He is on: - Lantus 35 >> 25 >> 20 >> 12 units in a.m. - Ozempic 0.25 >> 0.5 mg weekly in  a.m. He was previously on NovoLog 2-6 units per meal.  Pt checks his sugars 2x a day:  Prev.: - am:  96-120 >> 124-130s, 200s - sodas ay night! >> 116, 145 - 2h after b'fast: 134-150 >> n/c - lunch: 130-140 >> n/c >> 147 - 2h after lunch: n/c >> 171 - dinner: 145-160s (sodas) >> n/c >> 195 - 2h after dinner: 170s-180 >> 160s, 240  >> 68, 136, 179-265, 325 - bedtime: n/c >> 135-173  Previously:   Lowest sugar was 47 >> 70 >> 124 >> 68; he has hypoglycemia awareness at 60.  He was admitted with hypoglycemia-induced encephalopathy (blood sugar 40) 03/10/2022.  At that time he was unresponsive He was admitted with hypoglycemia (blood sugar 47) 03/31/2022. Highest sugar was 240 >> 300s.  Glucometer: Accu-Chek guide  Pt's meals are: - Breakfast: grits, scrambled eggs and bacon or cereal or oatmeal >> fruit, croissant + cheese and egg - Lunch: PB and jelly; sandwich; soup - Dinner: same; rotisserie chicken - Snacks: seldom banana as a dessert  - + stage 4 CKD, last BUN/creatinine:  Lab Results  Component Value Date   BUN 36 (H) 04/11/2023   BUN 45 (H) 08/03/2022   CREATININE 2.73 (H) 04/11/2023   CREATININE 2.33 (H) 08/03/2022  He is on lisinopril 10 mg daily.  - + HL; last set of lipids: 03/14/2023 - by PCP: need records Lab Results  Component Value Date   CHOL 201 (H) 07/02/2021   HDL 36 (L) 07/02/2021   LDLCALC 120 (H) 07/02/2021   TRIG 225 (H) 07/02/2021   CHOLHDL 5.6 07/02/2021  On Crestor 40 mg daily.  - last eye exam was in 06/2022. + DR. Dr. Fabian Sharp. 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 Surgeon: Dr. Allena Katz. Has cataract. Had surgery OS as he has been given scar tissue buildup in this eye, also.  - no numbness and tingling in his feet.  Last foot exam in 11/2022 by Dr. Al Corpus.  He is on ASA 81.  Pt has FH of DM in M, F, 1 B, 3 S's.  He is on iron po.  No personal history of pancreatitis or family history of medullary thyroid cancer or multiple endocrine neoplasia  syndromes.  ROS: + See HPI  Past Medical History:  Diagnosis Date   AICD (automatic cardioverter/defibrillator) present    Medtronic   AICD (automatic cardioverter/defibrillator) present    MDT Visia AF MRI   Anemia    CAD (coronary artery disease)    a. cath 01/31/17: 60% 1st RPLB, 60% dist RCA, 55% prox RCA, 10% pro LAD --> Rx TX.    CHF (congestive heart failure) (HCC)    Chronic systolic CHF (congestive heart failure) (HCC) 01/28/2017   1. Echo 01/29/17:  EF 20-25, normal wall motion, mild LAE // 2. EF 10-15 by [facility] 01/2017    Diabetes mellitus without complication (HCC)    type 2   Diabetic foot infection (HCC) 03/2016   RT FOOT   Dyspnea    History of kidney stones    passed   HTN (hypertension)    Hyperlipidemia    Hypertension    Myocardial infarction [facility]), although reported, NICM at cath    NICM (nonischemic cardiomyopathy) (HCC) 02/15/2017   1. Mod non-obs CAD on LHC in 01/2017 - CAD does not explain cardiomyopathy   Renal disorder    stage 4   Past Surgical History:  Procedure Laterality Date   AIR/FLUID EXCHANGE Right 07/14/2020   Procedure: AIR/FLUID EXCHANGE;  Surgeon: Carmela Rima, MD;  Location: [facility] OR;  Service: Ophthalmology;  Laterality: Right;   AIR/FLUID EXCHANGE Right 04/11/2023   Procedure: AIR/FLUID EXCHANGE;  Surgeon: Carmela Rima, MD;  Location: [facility] OR;  Service: Ophthalmology;  Laterality: Right;   AMPUTATION Right 04/01/2016   Procedure: Right Great Toe Amputation;  Surgeon: Nadara Mustard, MD;  Location: [facility] OR;  Service: Orthopedics;  Laterality: Right;   AMPUTATION Right 06/19/2016   Procedure: AMPUTATION SECOND TOE;  Surgeon: Eldred Manges, MD;  Location: [facility] OR;  Service: Orthopedics;  Laterality: Right;   BACK SURGERY     for abscess   CARDIAC CATHETERIZATION     EYE SURGERY     ICD IMPLANT N/A 01/15/2018   Procedure: ICD IMPLANT;  Surgeon: Duke Salvia, MD;  Location: [facility] INVASIVE CV LAB;  Service: Cardiovascular;  Laterality: N/A;    INJECTION OF SILICONE OIL Right 07/14/2020   Procedure: INJECTION OF SILICONE OIL;  Surgeon: Carmela Rima, MD;  Location: [facility] OR;  Service: Ophthalmology;  Laterality: Right;   INJECTION OF SILICONE OIL Right 08/25/2020   Procedure: INJECTION OF SILICONE OIL;  Surgeon: Carmela Rima, MD;  Location: [facility] OR;  Service: Ophthalmology;  Laterality: Right;   INJECTION OF SILICONE OIL Left 07/05/2022   Procedure: INJECTION OF SILICONE OIL;  Surgeon: Carmela Rima, MD;  Location: [facility] OR;  Service: Ophthalmology;  Laterality: Left;   INSERT / REPLACE /  REMOVE PACEMAKER     MEMBRANE PEEL Right 08/25/2020   Procedure: MEMBRANE PEEL;  Surgeon: Carmela Rima, MD;  Location: [facility] OR;  Service: Ophthalmology;  Laterality: Right;   MEMBRANE PEEL Left 07/05/2022   Procedure: MEMBRANECTOMY;  Surgeon: Carmela Rima, MD;  Location: [facility] OR;  Service: Ophthalmology;  Laterality: Left;   PARS PLANA VITRECTOMY Right 07/14/2020   Procedure: PARS PLANA VITRECTOMY WITH 25 GAUGE, Membranetomy, drainage of subretinal fluid;  Surgeon: Carmela Rima, MD;  Location: [facility] OR;  Service: Ophthalmology;  Laterality: Right;   PARS PLANA VITRECTOMY Right 08/25/2020   Procedure: PARS PLANA VITRECTOMY WITH 25 GAUGE;  Surgeon: Carmela Rima, MD;  Location: [facility] OR;  Service: Ophthalmology;  Laterality: Right;   PARS PLANA VITRECTOMY Left 07/05/2022   Procedure: LEFT PARS PLANA VITRECTOMY WITH 25 GAUGE;  Surgeon: Carmela Rima, MD;  Location: [facility] OR;  Service: Ophthalmology;  Laterality: Left;   PARS PLANA VITRECTOMY Left 10/25/2022   Procedure: PARS PLANA VITRECTOMY WITH 25 GAUGE;  Surgeon: Carmela Rima, MD;  Location: [facility] OR;  Service: Ophthalmology;  Laterality: Left;   PARS PLANA VITRECTOMY Right 04/11/2023   Procedure: PARS PLANA VITRECTOMY WITH 25 GAUGE;  Surgeon: Carmela Rima, MD;  Location: [facility] OR;  Service: Ophthalmology;  Laterality: Right;   PHOTOCOAGULATION WITH LASER Right 07/14/2020   Procedure: PHOTOCOAGULATION  WITH LASER;  Surgeon: Carmela Rima, MD;  Location: [facility] OR;  Service: Ophthalmology;  Laterality: Right;   PHOTOCOAGULATION WITH LASER Right 08/25/2020   Procedure: PHOTOCOAGULATION WITH LASER;  Surgeon: Carmela Rima, MD;  Location: [facility] OR;  Service: Ophthalmology;  Laterality: Right;   PHOTOCOAGULATION WITH LASER Left 07/05/2022   Procedure: PHOTOCOAGULATION WITH LASER;  Surgeon: Carmela Rima, MD;  Location: [facility] OR;  Service: Ophthalmology;  Laterality: Left;   PHOTOCOAGULATION WITH LASER Left 10/25/2022   Procedure: PHOTOCOAGULATION WITH LASER;  Surgeon: Carmela Rima, MD;  Location: [facility] OR;  Service: Ophthalmology;  Laterality: Left;   PHOTOCOAGULATION WITH LASER Right 04/11/2023   Procedure: PHOTOCOAGULATION WITH LASER;  Surgeon: Carmela Rima, MD;  Location: [facility] OR;  Service: Ophthalmology;  Laterality: Right;   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   Procedure: REPAIR OF HEMORRHAGIC DETACHMENT;  Surgeon: Carmela Rima, MD;  Location: [facility] OR;  Service: Ophthalmology;  Laterality: Right;   RIGHT/LEFT HEART CATH AND CORONARY ANGIOGRAPHY N/A 01/31/2017   Procedure: Right/Left Heart Cath and Coronary Angiography;  Surgeon: Lennette Bihari, MD;  Location: [facility] INVASIVE CV LAB;  Service: Cardiovascular;  Laterality: N/A;   SILICON OIL REMOVAL Right 08/25/2020   Procedure: SILICON OIL REMOVAL;  Surgeon: Carmela Rima, MD;  Location: [facility] OR;  Service: Ophthalmology;  Laterality: Right;   SILICON OIL REMOVAL Left 10/25/2022   Procedure: SILICON OIL REMOVAL;  Surgeon: Carmela Rima, MD;  Location: [facility] OR;  Service: Ophthalmology;  Laterality: Left;   SILICON OIL REMOVAL Right 04/11/2023   Procedure: SILICON OIL REMOVAL;  Surgeon: Carmela Rima, MD;  Location: [facility] OR;  Service: Ophthalmology;  Laterality: Right;   Social History   Socioeconomic History   Marital status: Significant Other    Spouse name: Significant other Britta Mccreedy   Number of children: 1   Years of  education: Not on file   Highest education level: Not on file  Occupational History   Occupation: Disabled  Tobacco Use   Smoking status: Never   Smokeless tobacco: Never  Vaping Use   Vaping status: Never Used  Substance and Sexual Activity   Alcohol use: Never   Drug use: Never  Sexual activity: Not Currently  Other Topics Concern   Not on file  Social History Narrative   ** Merged History Encounter **       Social Determinants of Health   Financial Resource Strain: Not on file  Food Insecurity: Not on file  Transportation Needs: Not on file  Physical Activity: Not on file  Stress: Not on file  Social Connections: Not on file  Intimate Partner Violence: Not on file   Current Outpatient Medications on File Prior to Visit  Medication Sig Dispense Refill   Accu-Chek Softclix Lancets lancets Use to check blood sugar three times daily E11.65 100 each 5   Accu-Chek Softclix Lancets lancets use lancet to check blood glucose once daily 100 each 3   acetaZOLAMIDE ER (DIAMOX) 500 MG capsule Take 1 capsule (500 mg total) by mouth daily. 60 capsule 1   albuterol (VENTOLIN HFA) 108 (90 Base) MCG/ACT inhaler Inhale 1 to 2 puffs by mouth into the lungs every 6 hours as needed for wheezing or shortness of breath. 18 g 0   apixaban (ELIQUIS) 5 MG TABS tablet Take 1 tablet (5 mg total) by mouth 2 (two) times daily for stroke prophylaxis 180 tablet 3   Blood Glucose Monitoring Suppl (ACCU-CHEK GUIDE) w/Device KIT Use to check blood sugar three times daily E11.65 1 kit 0   Blood Glucose Monitoring Suppl (ACCU-CHEK GUIDE) w/Device KIT use kit to check blod glucose 4-5 daily 1 kit 0   brimonidine (ALPHAGAN P) 0.1 % SOLN Place 1 drop into the left eye 2 (two) times daily. (Patient not taking: Reported on 04/10/2023) 5 mL 2   carvedilol (COREG) 12.5 MG tablet Take 12.5 mg by mouth 2 (two) times daily with a meal.     cetirizine (ZYRTEC) 10 MG tablet TAKE 1 TABLET (10 MG TOTAL) BY MOUTH DAILY.  (Patient taking differently: Take 10 mg by mouth daily as needed for allergies.) 30 tablet 0   Cholecalciferol (D3-50) 1.25 MG (50000 UT) capsule Take 1 capsule by mouth once weekly 4 capsule 1   Continuous Glucose Receiver (FREESTYLE LIBRE 2 READER) DEVI Use as directed. 1 each 3   Continuous Glucose Sensor (FREESTYLE LIBRE 2 SENSOR) MISC Change every 14 (fourteen) days. 6 each 3   Elastic Bandages & Supports (T.E.D. KNEE LENGTH/S-LONG) MISC 1 (one) each daily, apply compression stockings daily to help decrease leg swelling (Patient not taking: Reported on 01/04/2023) 1 each 0   ferrous sulfate 325 (65 FE) MG tablet Take 325 mg by mouth in the morning and at bedtime.     furosemide (LASIX) 40 MG tablet Take 1 tablet (40 mg total) by mouth 2 (two) times daily. 180 tablet 11   gabapentin (NEURONTIN) 300 MG capsule TAKE 2 CAPSULES BY MOUTH AT BEDTIME 180 capsule 2   glucose blood (ACCU-CHEK GUIDE) test strip Use to check blood sugar three times daily E11.65 100 each 12   glucose blood test strip use strip to check blood glucose 4-5 times daily 300 strip 3   hydrALAZINE (APRESOLINE) 50 MG tablet Take 1 tablet (50 mg total) by mouth 3 (three) times daily. 270 tablet 3   Insulin Glargine Solostar (LANTUS) 100 UNIT/ML Solostar Pen inject 12-14 units subcutaneously in the morning 30 mL 6   Insulin Pen Needle (B-D UF III MINI PEN NEEDLES) 31G X 5 MM MISC Inject 1 Pen into the skin daily. 100 each 3   Insulin Pen Needle (BD PEN NEEDLE NANO U/F) 32G X 4 MM MISC  USE TO INJECT LANTUS DAILY. MUST USE NEW PEN NEEDLE WITH EACH INJECTION. 100 each 3   isosorbide mononitrate (IMDUR) 30 MG 24 hr tablet Take 30 mg by mouth in the morning.     lactulose (CHRONULAC) 10 GM/15ML solution Take 30 mLs (20 g total) by mouth daily. (Patient taking differently: Take 10-20 g by mouth daily as needed (constipation.).) 1800 mL 3   lisinopril (ZESTRIL) 10 MG tablet Take 1 tablet (10 mg total) by mouth daily. (Patient not taking:  Reported on 12/15/2022) 90 tablet 2   losartan (COZAAR) 25 MG tablet Take 1 tablet (25 mg total) by mouth daily. (Patient not taking: Reported on 04/10/2023) 30 tablet 11   losartan (COZAAR) 25 MG tablet Take 1 tablet (25 mg total) by mouth 2 (two) times daily for hypertension. 180 tablet 3   losartan (COZAAR) 50 MG tablet Take 1 tablet (50 mg total) by mouth daily. (Patient not taking: Reported on 04/10/2023) 90 tablet 3   ofloxacin (OCUFLOX) 0.3 % ophthalmic solution Place 1 drop into both eyes 4 (four) times daily. 5 mL 0   ondansetron (ZOFRAN) 4 MG tablet Take 1 tablet (4 mg total) by mouth every 6 (six) hours as needed for nausea or vomiting. 12 tablet 0   prednisoLONE acetate (PRED FORTE) 1 % ophthalmic suspension Place 1 drop into the right eye 4 (four) times daily.     rosuvastatin (CRESTOR) 40 MG tablet TAKE 1 TABLET BY MOUTH DAILY 90 tablet 3   Semaglutide,0.25 or 0.5MG /DOS, 2 MG/3ML SOPN Inject 0.5 mg into the skin once a week. (Patient taking differently: Inject 0.5 mg into the skin every Sunday.) 3 mL 3   tamsulosin (FLOMAX) 0.4 MG CAPS capsule TAKE 1 CAPSULE BY MOUTH AT BEDTIME (STOP IF DIZZINESS OCCURS) 90 capsule 2   Vitamin D, Ergocalciferol, (DRISDOL) 1.25 MG (50000 UNIT) CAPS capsule take 1 capsule by mouth once a week on Sunday's x 12 weeks 12 capsule 1   [DISCONTINUED] amLODipine (NORVASC) 10 MG tablet Take 1 tablet (10 mg total) by mouth daily. 30 tablet 0   No current facility-administered medications on file prior to visit.   No Known Allergies Family History  Problem Relation Age of Onset   Diabetes Mother    Hypertension Mother    Diabetes Father    Heart attack Father    Diabetes Sister    Heart attack Maternal Grandmother    Colon cancer Neg Hx    Colon polyps Neg Hx    Esophageal cancer Neg Hx    Rectal cancer Neg Hx    Stomach cancer Neg Hx    PE: There were no vitals taken for this visit. Wt Readings from Last 3 Encounters:  04/11/23 183 lb (83 kg)   03/28/23 181 lb 9.6 oz (82.4 kg)  03/16/23 187 lb (84.8 kg)   Constitutional: overweight, in NAD; had difficulty rising from the chair, had to be helped Eyes: no exophthalmos ENT: no thyromegaly, no cervical lymphadenopathy Cardiovascular: RRR, No MRG Respiratory: CTA B Musculoskeletal: no deformities except first to right toes amputated Skin: no rashes Neurological: no tremor with outstretched hands  ASSESSMENT: 1. DM2, insulin-dependent, uncontrolled, with complications - CAD - diastolic and systolic CHF - CVAs - CKD stage 3 - DR - peripheral neuropathy, s/p amputations 1st and 2nd R toes - recurring hypoglycemia -admitted with hypoglycemia induced encephalopathy on 03/10/2022 - glu of 40 and was unresponsive.  Admitted again with hypoglycemia at 47 on 03/31/2022.  2. HL  PLAN:  1. Patient with longstanding, uncontrolled, type 2 diabetes previously on basal/bolus insulin regimen with recurrent episodes of hypoglycemia.  He was able to stop mealtime insulin when starting weekly GLP-1 receptor agonist.  2 weeks prior to our last appointment, HbA1c was higher, at 8.5%.  Upon questioning, he stopped drinking sodas in the middle of the night as advised, but he was drinking juice.  He had hyperglycemic spikes in the 200s and even 300s after juice.  I strongly advised him to stop any sweet drinks immediately but did not change his regimen.  I also recommended to start back on the CGM and sent a prescription for this to his pharmacy.  Wife prefers to have the receiver as he did not have a smart phone.  - I suggested to:  Patient Instructions  Please continue: - Lantus 12-14 units in a.m. - Ozempic 0.5 mg weekly in a.m.  NO SWEET DRINK! NO SODAS, JUICE, SWEET TEA!  Please return in 3-4 months.  - we checked his HbA1c: 7%  - advised to check sugars at different times of the day - 4x a day, rotating check times - advised for yearly eye exams >> he is UTD - he has poor circulation and  poor sensation in his feet.  He has a history of transmetatarsal amputation -I recommended to establish care at [facility].  They saw Dr. Al Corpus in 11/2022. - return to clinic in 3-4 months  2. HL -He had a lipid panel in 2 weeks prior to our last visit and I advised the patient to bring the records -Reviewed latest lipid panel available for review from 06/2021: All fractions abnormal: Lab Results  Component Value Date   CHOL 201 (H) 07/02/2021   HDL 36 (L) 07/02/2021   LDLCALC 120 (H) 07/02/2021   TRIG 225 (H) 07/02/2021   CHOLHDL 5.6 07/02/2021  -He continues Crestor 40 mg daily without side effects -He had another lipid panel 2 weeks ago.  Wife will bring me the results at next visit.  Carlus Pavlov, MD PhD [facility] Endocrinology

## 2023-07-06 ENCOUNTER — Other Ambulatory Visit: Payer: Self-pay

## 2023-07-07 ENCOUNTER — Other Ambulatory Visit: Payer: Self-pay

## 2023-07-11 ENCOUNTER — Other Ambulatory Visit: Payer: Self-pay

## 2023-07-11 ENCOUNTER — Ambulatory Visit: Payer: 59 | Admitting: Podiatry

## 2023-07-11 DIAGNOSIS — Z91199 Patient's noncompliance with other medical treatment and regimen due to unspecified reason: Secondary | ICD-10-CM

## 2023-07-13 ENCOUNTER — Other Ambulatory Visit: Payer: Self-pay

## 2023-07-13 MED ORDER — JARDIANCE 10 MG PO TABS
10.0000 mg | ORAL_TABLET | Freq: Every day | ORAL | 3 refills | Status: AC
  Filled 2023-07-13: qty 90, 90d supply, fill #0
  Filled 2024-02-20: qty 90, 90d supply, fill #1

## 2023-07-15 NOTE — Progress Notes (Signed)
1. No-show for appointment     

## 2023-07-18 ENCOUNTER — Other Ambulatory Visit: Payer: Self-pay

## 2023-07-18 MED ORDER — POTASSIUM CHLORIDE ER 10 MEQ PO CPCR
10.0000 meq | ORAL_CAPSULE | Freq: Every day | ORAL | 3 refills | Status: AC
  Filled 2023-07-18 – 2023-07-25 (×2): qty 90, 90d supply, fill #0
  Filled 2023-10-16: qty 90, 90d supply, fill #1
  Filled 2024-01-15 – 2024-01-25 (×2): qty 90, 90d supply, fill #2
  Filled 2024-04-24: qty 90, 90d supply, fill #3

## 2023-07-21 ENCOUNTER — Encounter: Payer: Self-pay | Admitting: Cardiology

## 2023-07-21 ENCOUNTER — Encounter (HOSPITAL_BASED_OUTPATIENT_CLINIC_OR_DEPARTMENT_OTHER): Payer: Self-pay | Admitting: Internal Medicine

## 2023-07-21 ENCOUNTER — Encounter: Payer: 59 | Admitting: Cardiology

## 2023-07-21 ENCOUNTER — Ambulatory Visit (HOSPITAL_BASED_OUTPATIENT_CLINIC_OR_DEPARTMENT_OTHER): Payer: 59 | Attending: Nurse Practitioner | Admitting: Internal Medicine

## 2023-07-21 ENCOUNTER — Ambulatory Visit (INDEPENDENT_AMBULATORY_CARE_PROVIDER_SITE_OTHER): Payer: 59

## 2023-07-21 VITALS — Ht 67.0 in | Wt 166.8 lb

## 2023-07-21 DIAGNOSIS — I428 Other cardiomyopathies: Secondary | ICD-10-CM | POA: Diagnosis not present

## 2023-07-21 DIAGNOSIS — R0683 Snoring: Secondary | ICD-10-CM | POA: Diagnosis present

## 2023-07-21 DIAGNOSIS — I493 Ventricular premature depolarization: Secondary | ICD-10-CM | POA: Insufficient documentation

## 2023-07-21 DIAGNOSIS — G4730 Sleep apnea, unspecified: Secondary | ICD-10-CM | POA: Diagnosis not present

## 2023-07-21 HISTORY — DX: Sleep apnea, unspecified: G47.30

## 2023-07-21 LAB — CUP PACEART REMOTE DEVICE CHECK
Battery Remaining Longevity: 63 mo
Battery Voltage: 2.98 V
Brady Statistic RV Percent Paced: 0.01 %
Date Time Interrogation Session: 20240809001803
HighPow Impedance: 52 Ohm
Implantable Lead Connection Status: 753985
Implantable Lead Implant Date: 20190204
Implantable Lead Location: 753860
Implantable Pulse Generator Implant Date: 20190204
Lead Channel Impedance Value: 323 Ohm
Lead Channel Impedance Value: 380 Ohm
Lead Channel Pacing Threshold Amplitude: 1.375 V
Lead Channel Pacing Threshold Pulse Width: 0.4 ms
Lead Channel Sensing Intrinsic Amplitude: 7.5 mV
Lead Channel Sensing Intrinsic Amplitude: 7.5 mV
Lead Channel Setting Pacing Amplitude: 2.75 V
Lead Channel Setting Pacing Pulse Width: 0.4 ms
Lead Channel Setting Sensing Sensitivity: 0.3 mV
Zone Setting Status: 755011
Zone Setting Status: 755011

## 2023-07-21 NOTE — Progress Notes (Deleted)
Cardiology Office Note Date:  07/21/2023  Patient ID:  Jeffrey, [name] [birth year], MRN [id number] PCP:  Estevan Oaks, NP  Cardiologist:  Kristeen Miss, MD Electrophysiologist: Sherryl Manges, MD  ***refresh   Chief Complaint: 1year device follow-up, past due; pre-procedure clearance  History of Present Illness: Jeffrey [name] is a 57 y.o. male with PMH notable for nonobs CAD, HFimpEF, NICM, VT s/p ICD, HTN; seen today for Sherryl Manges, MD for cardiac clearance for evisceration scheduled 8/13.  The patient last saw Dr. Graciela Husbands 02/2022, HTN with fluid overload. Lasix had recently been increased by nephrologist to 80mg  BID x 4 days (previusly 40mg  BID).   Since last being seen in our clinic the patient reports doing ***.  he denies chest pain, palpitations, dyspnea, PND, orthopnea, nausea, vomiting, dizziness, syncope, edema, weight gain, or early satiety.    Device Information: MDT single chamber ICD, imp 01/2018; dx VT  AAD History: none  Past Medical History:  Diagnosis Date   AICD (automatic cardioverter/defibrillator) present    Medtronic   AICD (automatic cardioverter/defibrillator) present    MDT Visia AF MRI   Anemia    CAD (coronary artery disease)    a. cath 01/31/17: 60% 1st RPLB, 60% dist RCA, 55% prox RCA, 10% pro LAD --> Rx TX.    CHF (congestive heart failure) (HCC)    Chronic systolic CHF (congestive heart failure) (HCC) 01/28/2017   1. Echo 01/29/17:  EF 20-25, normal wall motion, mild LAE // 2. EF 10-15 by [facility] 01/2017    Diabetes mellitus without complication (HCC)    type 2   Diabetic foot infection (HCC) 03/2016   RT FOOT   Dyspnea    History of kidney stones    passed   HTN (hypertension)    Hyperlipidemia    Hypertension    Myocardial infarction [facility]), although reported, NICM at cath    NICM (nonischemic cardiomyopathy) (HCC) 02/15/2017   1. Mod non-obs CAD on LHC in 01/2017 - CAD does not explain cardiomyopathy   Renal disorder    stage 4     Past Surgical History:  Procedure Laterality Date   AIR/FLUID EXCHANGE Right 07/14/2020   Procedure: AIR/FLUID EXCHANGE;  Surgeon: Carmela Rima, MD;  Location: [facility] OR;  Service: Ophthalmology;  Laterality: Right;   AIR/FLUID EXCHANGE Right 04/11/2023   Procedure: AIR/FLUID EXCHANGE;  Surgeon: Carmela Rima, MD;  Location: [facility] OR;  Service: Ophthalmology;  Laterality: Right;   AMPUTATION Right 04/01/2016   Procedure: Right Great Toe Amputation;  Surgeon: Nadara Mustard, MD;  Location: [facility] OR;  Service: Orthopedics;  Laterality: Right;   AMPUTATION Right 06/19/2016   Procedure: AMPUTATION SECOND TOE;  Surgeon: Eldred Manges, MD;  Location: [facility] OR;  Service: Orthopedics;  Laterality: Right;   BACK SURGERY     for abscess   CARDIAC CATHETERIZATION     EYE SURGERY     ICD IMPLANT N/A 01/15/2018   Procedure: ICD IMPLANT;  Surgeon: Duke Salvia, MD;  Location: [facility] INVASIVE CV LAB;  Service: Cardiovascular;  Laterality: N/A;   INJECTION OF SILICONE OIL Right 07/14/2020   Procedure: INJECTION OF SILICONE OIL;  Surgeon: Carmela Rima, MD;  Location: [facility] OR;  Service: Ophthalmology;  Laterality: Right;   INJECTION OF SILICONE OIL Right 08/25/2020   Procedure: INJECTION OF SILICONE OIL;  Surgeon: Carmela Rima, MD;  Location: [facility] OR;  Service: Ophthalmology;  Laterality: Right;   INJECTION OF SILICONE OIL Left 07/05/2022   Procedure: INJECTION OF SILICONE  OIL;  Surgeon: Carmela Rima, MD;  Location: [facility] OR;  Service: Ophthalmology;  Laterality: Left;   INSERT / REPLACE / REMOVE PACEMAKER     MEMBRANE PEEL Right 08/25/2020   Procedure: MEMBRANE PEEL;  Surgeon: Carmela Rima, MD;  Location: [facility] OR;  Service: Ophthalmology;  Laterality: Right;   MEMBRANE PEEL Left 07/05/2022   Procedure: MEMBRANECTOMY;  Surgeon: Carmela Rima, MD;  Location: [facility] OR;  Service: Ophthalmology;  Laterality: Left;   PARS PLANA VITRECTOMY Right 07/14/2020   Procedure: PARS PLANA VITRECTOMY WITH 25 GAUGE,  Membranetomy, drainage of subretinal fluid;  Surgeon: Carmela Rima, MD;  Location: [facility] OR;  Service: Ophthalmology;  Laterality: Right;   PARS PLANA VITRECTOMY Right 08/25/2020   Procedure: PARS PLANA VITRECTOMY WITH 25 GAUGE;  Surgeon: Carmela Rima, MD;  Location: [facility] OR;  Service: Ophthalmology;  Laterality: Right;   PARS PLANA VITRECTOMY Left 07/05/2022   Procedure: LEFT PARS PLANA VITRECTOMY WITH 25 GAUGE;  Surgeon: Carmela Rima, MD;  Location: [facility] OR;  Service: Ophthalmology;  Laterality: Left;   PARS PLANA VITRECTOMY Left 10/25/2022   Procedure: PARS PLANA VITRECTOMY WITH 25 GAUGE;  Surgeon: Carmela Rima, MD;  Location: [facility] OR;  Service: Ophthalmology;  Laterality: Left;   PARS PLANA VITRECTOMY Right 04/11/2023   Procedure: PARS PLANA VITRECTOMY WITH 25 GAUGE;  Surgeon: Carmela Rima, MD;  Location: [facility] OR;  Service: Ophthalmology;  Laterality: Right;   PHOTOCOAGULATION WITH LASER Right 07/14/2020   Procedure: PHOTOCOAGULATION WITH LASER;  Surgeon: Carmela Rima, MD;  Location: [facility] OR;  Service: Ophthalmology;  Laterality: Right;   PHOTOCOAGULATION WITH LASER Right 08/25/2020   Procedure: PHOTOCOAGULATION WITH LASER;  Surgeon: Carmela Rima, MD;  Location: [facility] OR;  Service: Ophthalmology;  Laterality: Right;   PHOTOCOAGULATION WITH LASER Left 07/05/2022   Procedure: PHOTOCOAGULATION WITH LASER;  Surgeon: Carmela Rima, MD;  Location: [facility] OR;  Service: Ophthalmology;  Laterality: Left;   PHOTOCOAGULATION WITH LASER Left 10/25/2022   Procedure: PHOTOCOAGULATION WITH LASER;  Surgeon: Carmela Rima, MD;  Location: [facility] OR;  Service: Ophthalmology;  Laterality: Left;   PHOTOCOAGULATION WITH LASER Right 04/11/2023   Procedure: PHOTOCOAGULATION WITH LASER;  Surgeon: Carmela Rima, MD;  Location: [facility] OR;  Service: Ophthalmology;  Laterality: Right;   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   Procedure: REPAIR OF HEMORRHAGIC DETACHMENT;  Surgeon: Carmela Rima,  MD;  Location: [facility] OR;  Service: Ophthalmology;  Laterality: Right;   RIGHT/LEFT HEART CATH AND CORONARY ANGIOGRAPHY N/A 01/31/2017   Procedure: Right/Left Heart Cath and Coronary Angiography;  Surgeon: Lennette Bihari, MD;  Location: [facility] INVASIVE CV LAB;  Service: Cardiovascular;  Laterality: N/A;   SILICON OIL REMOVAL Right 08/25/2020   Procedure: SILICON OIL REMOVAL;  Surgeon: Carmela Rima, MD;  Location: [facility] OR;  Service: Ophthalmology;  Laterality: Right;   SILICON OIL REMOVAL Left 10/25/2022   Procedure: SILICON OIL REMOVAL;  Surgeon: Carmela Rima, MD;  Location: [facility] OR;  Service: Ophthalmology;  Laterality: Left;   SILICON OIL REMOVAL Right 04/11/2023   Procedure: SILICON OIL REMOVAL;  Surgeon: Carmela Rima, MD;  Location: [facility] OR;  Service: Ophthalmology;  Laterality: Right;    Current Outpatient Medications  Medication Instructions   Accu-Chek Softclix Lancets lancets Use to check blood sugar three times daily E11.65   Accu-Chek Softclix Lancets lancets use lancet to check blood glucose once daily   acetaminophen (TYLENOL) 1,000 mg, Oral, Every 6 hours PRN   acetaZOLAMIDE ER (DIAMOX) 500 mg, Oral, Daily   albuterol (VENTOLIN HFA) 108 (90 Base) MCG/ACT  inhaler Inhale 1 to 2 puffs by mouth into the lungs every 6 hours as needed for wheezing or shortness of breath.   apixaban (ELIQUIS) 5 MG TABS tablet Take 1 tablet (5 mg total) by mouth 2 (two) times daily for stroke prophylaxis   Blood Glucose Monitoring Suppl (ACCU-CHEK GUIDE) w/Device KIT Use to check blood sugar three times daily E11.65   Blood Glucose Monitoring Suppl (ACCU-CHEK GUIDE) w/Device KIT use kit to check blod glucose 4-5 daily   brimonidine (ALPHAGAN P) 0.1 % SOLN 1 drop, Left Eye, 2 times daily   brimonidine (ALPHAGAN) 0.2 % ophthalmic solution 1 drop, Right Eye, 3 times daily   carvedilol (COREG) 12.5 mg, Oral, 2 times daily with meals   cetirizine (ZYRTEC) 10 mg, Oral, Daily   Continuous Glucose Receiver (FREESTYLE  LIBRE 2 READER) DEVI Use as directed.   Continuous Glucose Sensor (FREESTYLE LIBRE 2 SENSOR) MISC Change every 14 (fourteen) days.   Elastic Bandages & Supports (T.E.D. KNEE LENGTH/S-LONG) MISC 1 (one) each daily, apply compression stockings daily to help decrease leg swelling   empagliflozin (JARDIANCE) 10 MG TABS tablet Take 1 tablet (10 mg total) by mouth daily for diabetes   ferrous sulfate 325 mg, Oral, Daily with breakfast   furosemide (LASIX) 40 mg, Oral, 2 times daily   gabapentin (NEURONTIN) 300 MG capsule TAKE 2 CAPSULES BY MOUTH AT BEDTIME   glucose blood (ACCU-CHEK GUIDE) test strip Use to check blood sugar three times daily E11.65   glucose blood test strip use strip to check blood glucose 4-5 times daily   hydrALAZINE (APRESOLINE) 50 mg, Oral, 3 times daily   Insulin Glargine Solostar (LANTUS) 100 UNIT/ML Solostar Pen inject 12-14 units subcutaneously in the morning   Insulin Pen Needle (B-D UF III MINI PEN NEEDLES) 31G X 5 MM MISC 1 Pen, Subcutaneous, Daily   Insulin Pen Needle (BD PEN NEEDLE NANO U/F) 32G X 4 MM MISC USE TO INJECT LANTUS DAILY. MUST USE NEW PEN NEEDLE WITH EACH INJECTION.   isosorbide mononitrate (IMDUR) 30 mg, Oral, Every morning   lactulose (CHRONULAC) 20 g, Oral, Daily   lisinopril (ZESTRIL) 10 mg, Oral, Daily   losartan (COZAAR) 50 mg, Oral, Daily   magnesium oxide (MAG-OX) 400 mg, Oral, Daily   ofloxacin (OCUFLOX) 0.3 % ophthalmic solution 1 drop, Both Eyes, 4 times daily   ondansetron (ZOFRAN) 4 mg, Oral, Every 6 hours PRN   Ozempic (0.25 or 0.5 MG/DOSE) 0.5 mg, Subcutaneous, Weekly   potassium chloride (MICRO-K) 10 MEQ CR capsule 10 mEq, Oral, Daily   rosuvastatin (CRESTOR) 40 MG tablet TAKE 1 TABLET BY MOUTH DAILY   tamsulosin (FLOMAX) 0.4 MG CAPS capsule TAKE 1 CAPSULE BY MOUTH AT BEDTIME (STOP IF DIZZINESS OCCURS)   Vitamin D, Ergocalciferol, (DRISDOL) 1.25 MG (50000 UNIT) CAPS capsule take 1 capsule by mouth once a week on Sunday's x 12 weeks     Social History:  The patient  reports that he has never smoked. He has never used smokeless tobacco. He reports that he does not drink alcohol and does not use drugs.   Family History:  *** include only if pertinent The patient's family history includes Diabetes in his father, mother, and sister; Heart attack in his father and maternal grandmother; Hypertension in his mother.***  ROS:  Please see the history of present illness. All other systems are reviewed and otherwise negative.   PHYSICAL EXAM: *** VS:  There were no vitals taken for this visit. BMI: There is no  height or weight on file to calculate BMI.  GEN- The patient is well appearing, alert and oriented x 3 today.   Lungs- Clear to ausculation bilaterally, normal work of breathing.  Heart- {Blank single:19197::"Regular","Irregularly irregular"} rate and rhythm, no murmurs, rubs or gallops Extremities- {EDEMA LEVEL:28147::"No"} peripheral edema, warm, dry Skin-  *** device pocket well-healed, no tethering   Device interrogation done today and reviewed by myself:  Battery *** Lead thresholds, impedence, sensing stable *** *** episodes *** changes made today  EKG is ordered. Personal review of EKG from today shows:  ***        Recent Labs: 07/24/2022: ALT 20 04/11/2023: BUN 36; Creatinine, Ser 2.73; Hemoglobin 13.3; Platelets 260; Potassium 3.1; Sodium 141  No results found for requested labs within last 365 days.   CrCl cannot be calculated ([name] most recent lab result is older than the maximum 21 days allowed.).   Wt Readings from Last 3 Encounters:  04/11/23 183 lb (83 kg)  03/28/23 181 lb 9.6 oz (82.4 kg)  03/16/23 187 lb (84.8 kg)     Additional studies reviewed include: Previous EP, cardiology notes.   TTE, 03/31/2022 1. Left ventricular ejection fraction, by estimation, is 50 to 55%. The left ventricle has low normal function. The left ventricle has no regional wall motion abnormalities. There is  severe concentric left ventricular hypertrophy. Left ventricular diastolic parameters are consistent with Grade I diastolic dysfunction (impaired relaxation).   2. Right ventricular systolic function is normal. The right ventricular size is mildly enlarged. There is normal pulmonary artery systolic pressure. The estimated right ventricular systolic pressure is 24.5 mmHg.   3. The mitral valve is grossly normal. No evidence of mitral valve regurgitation. No evidence of mitral stenosis.   4. The aortic valve is abnormal. Aortic valve regurgitation is not visualized. Aortic valve sclerosis is present, with no evidence of aortic valve stenosis.   Comparison(s): LVEF has improved from prior.  TTE, 07/01/2021 1. Left ventricular ejection fraction, by estimation, is 30 to 35%. The left ventricle has moderately decreased function. The left ventricle demonstrates global hypokinesis. Left ventricular diastolic parameters are consistent with Grade I diastolic dysfunction (impaired relaxation).   2. Right ventricular systolic function is normal. The right ventricular size is normal. There is normal pulmonary artery systolic pressure.   3. The mitral valve is grossly normal. No evidence of mitral valve regurgitation. No evidence of mitral stenosis.   4. The aortic valve was not well visualized. There is mild thickening of the aortic valve. Aortic valve regurgitation is not visualized. Mild aortic valve sclerosis is present, with no evidence of aortic valve stenosis.   5. The inferior vena cava is normal in size with greater than 50% respiratory variability, suggesting right atrial pressure of 3 mmHg.   Comparison(s): No prior Echocardiogram.     ASSESSMENT AND PLAN:  #) VT #) MDT ICD in situ   #) HFimpEF    {Are you ordering a CV Procedure (e.g. stress test, cath, DCCV, TEE, etc)?   Press F2        :[id number]}   Current medicines are reviewed at length with the patient today.   The patient {ACTIONS;  HAS/DOES NOT HAVE:19233} concerns regarding his medicines.  The following changes were made today:  {NONE DEFAULTED:18576}  Labs/ tests ordered today include: *** No orders of the defined types were placed in this encounter.    Disposition: Follow up with {EPMDS:28135} or EP APP {EPFOLLOW WU:98119}   Signed, Sherie Don, NP  07/21/23  12:32 PM  Electrophysiology [facility]

## 2023-07-22 DIAGNOSIS — G4730 Sleep apnea, unspecified: Secondary | ICD-10-CM | POA: Diagnosis not present

## 2023-07-22 NOTE — Procedures (Signed)
       Patient Name: Jeffrey [name], Jeffrey [name] Date: 07/21/2023 Gender: Male D.O.B: 03/30/1966 Age (years): 57 Referring Provider: Estevan Oaks Height (inches): 67 Interpreting Physician: Jetty Duhamel MD, ABSM Weight (lbs): 167 RPSGT: Lowry Ram BMI: 26 MRN: [id number] Neck Size: 16.00  CLINICAL INFORMATION Sleep Study Type: NPSG Indication for sleep study: Congestive Heart Failure, Daytime Fatigue, Diabetes, Fatigue, Hypertension, Obesity, Snoring, Witnesses Apnea / Gasping During Sleep Epworth Sleepiness Score:24  SLEEP STUDY TECHNIQUE As per the AASM Manual for the Scoring of Sleep and Associated Events v2.3 (April 2016) with a hypopnea requiring 4% desaturations.  The channels recorded and monitored were frontal, central and occipital EEG, electrooculogram (EOG), submentalis EMG (chin), nasal and oral airflow, thoracic and abdominal wall motion, anterior tibialis EMG, snore microphone, electrocardiogram, and pulse oximetry.  MEDICATIONS Medications self-administered by patient taken the night of the study : none reported  SLEEP ARCHITECTURE The study was initiated at 10:32:09 PM and ended at 4:59:50 AM.  Sleep onset time was 19.8 minutes and the sleep efficiency was 73.0%. The total sleep time was 283 minutes.  Stage REM latency was 46.0 minutes.  The patient spent 3.4% of the night in stage N1 sleep, 77.2% in stage N2 sleep, 0.0% in stage N3 and 19.4% in REM.  Alpha intrusion was absent.  Supine sleep was 99.47%.  RESPIRATORY PARAMETERS The overall apnea/hypopnea index (AHI) was 3.0 per hour. There were 0 total apneas, including 0 obstructive, 0 central and 0 mixed apneas. There were 14 hypopneas and 3 RERAs.  The AHI during Stage REM sleep was 6.5 per hour.  AHI while supine was 3.0 per hour.  The mean oxygen saturation was 96.1%. The minimum SpO2 during sleep was 88.0%.  soft snoring was noted during this study.  CARDIAC DATA The 2 lead EKG  demonstrated pacemaker generated. The mean heart rate was 75.8 beats per minute. Other EKG findings include: PVCs.  LEG MOVEMENT DATA The total PLMS were 0 with a resulting PLMS index of 0.0. Associated arousal with leg movement index was 0.4 .  IMPRESSIONS - No significant obstructive sleep apnea occurred during this study, within normal limits (AHI = 3.0/h). - The patient had minimal or no oxygen desaturation during the study (Min O2 = 88.0%, Mean 96.1%) - The patient snored with soft snoring volume. - EKG findings include paced rhythm with PVCs. - Clinically, significant periodic limb movements did not occur during sleep. No significant associated arousals.  DIAGNOSIS - Normal study  RECOMMENDATIONS - Manage for symptoms based on clinical judgment. - Sleep hygiene should be reviewed to assess factors that may improve sleep quality. - Weight management and regular exercise should be initiated or continued if appropriate.  [Electronically signed] 07/22/2023 11:29 AM  Jetty Duhamel MD, ABSM Diplomate, American Board of Sleep Medicine NPI: [id number]                      Jetty Duhamel Diplomate, American Board of Sleep Medicine  ELECTRONICALLY SIGNED ON:  07/22/2023, 11:21 AM [facility] PH: [phone]   FX: [phone] ACCREDITED BY THE AMERICAN ACADEMY OF SLEEP MEDICINE

## 2023-07-24 ENCOUNTER — Other Ambulatory Visit: Payer: Self-pay

## 2023-07-24 NOTE — Progress Notes (Signed)
Spoke with Dr. Eliane Decree scheduler and she states pt did call and cancelled surgery for Tuesday, 07/25/23. They plan to reschedule pt for next week. I did tell the scheduler that pt still needs to have cardiac clearance prior to surgery. She states that pt's wife plans to get him scheduled this week with the cardiologist.

## 2023-07-25 ENCOUNTER — Other Ambulatory Visit: Payer: Self-pay

## 2023-07-31 NOTE — Progress Notes (Unsigned)
  Cardiology Office Note:   Date:  08/01/2023  ID:  Jeffrey [name], DOB 12-Apr-1966, MRN [id number]  History of Present Illness:   Jeffrey [name] is a 57 y.o. male with history of HTN, CKD III, NICM, and syncope seen today for routine electrophysiology followup.   Last seen 02/2022.   He walks very slowly and does not come to appointments regularly.  He had an eye surgery earlier this year for retinal detachment, and now they are potentially planning to remove the eye. He presents today for cardiac clearance.   Since last being seen in our clinic the patient reports doing about the same. He is not very involved in the conversation. Denies syncope or chest pain. No significant edema.   Review of systems complete and found to be negative unless listed in HPI.    EP Information / Studies Reviewed:    EKG is ordered today. Personal review as below.  EKG Interpretation Date/Time:  Tuesday August 01 2023 09:24:05 EDT Ventricular Rate:  70 PR Interval:  212 QRS Duration:  86 QT Interval:  410 QTC Calculation: 442 R Axis:   -32  Text Interpretation: Sinus rhythm with 1st degree A-V block Left axis deviation Confirmed by Maxine Glenn [phone]) on 08/01/2023 9:29:29 AM    Barostim Interrogation- Performed personally and reviewed in detail today,  See scanned report  Device history Medtronic Single Chamber ICD implanted 01/2018 for NICM and syncope History of appropriate therapy: No History of AAD therapy: No   Barostim (Standard) implanted 11/26/2021     Physical Exam:   VS:  BP 126/88   Pulse 72   Ht 5\' 7"  (1.702 m)   Wt 163 lb 12.8 oz (74.3 kg)   SpO2 96%   BMI 25.65 kg/m    Wt Readings from Last 3 Encounters:  08/01/23 163 lb 12.8 oz (74.3 kg)  07/21/23 166 lb 12.8 oz (75.7 kg)  04/11/23 183 lb (83 kg)     GEN: Appears older than stated age and chronically ill. NAD NECK: No JVD; No carotid bruits CARDIAC: Regular rate and rhythm, no murmurs, rubs, gallops RESPIRATORY:   Clear to auscultation without rales, wheezing or rhonchi  ABDOMEN: Soft, non-tender, non-distended EXTREMITIES:  No edema; No deformity   ASSESSMENT AND PLAN:    HFrecEF s/p Medtronic and Barostim implantation EF normalized by Echo 03/2022 NYHA III symptoms. Confounded by overall lack of activity Device programmed at 8.4 millamp @ 125 [place] for chronic programming.  Device impedence stable. Battery to 12/2025 Pt goals are to continue to improve functional status.  Normal device function See scanned report.  Cardiac Clearance for Evisceration of his R eye Echo 03/2022 LVEF 50-55%, Grade 1 DD (Improved from 30-35% 06/2021) The patient is cleared for surgery from a cardiac perspective with an estimated Low Risk of perioperative cardiac complications by the Revised Cardiac Risk Index Nedra Hai Criteria). The patient may proceed without further cardiac work up.   The patient has an implanted cardiac device and additional clearance will be required through our device clinic due to the potential for interaction with the device during surgery.    He is NOT dependent on his device. A Magnet should be used.   HTN Stable on current regimen   Disposition:   Follow up with EP APP in in 6 months  Signed, Graciella Freer, PA-C

## 2023-08-01 ENCOUNTER — Ambulatory Visit: Payer: 59 | Attending: Student | Admitting: Student

## 2023-08-01 ENCOUNTER — Encounter: Payer: Self-pay | Admitting: Internal Medicine

## 2023-08-01 ENCOUNTER — Encounter: Payer: Self-pay | Admitting: Student

## 2023-08-01 VITALS — BP 126/88 | HR 72 | Ht 67.0 in | Wt 163.8 lb

## 2023-08-01 DIAGNOSIS — I5022 Chronic systolic (congestive) heart failure: Secondary | ICD-10-CM

## 2023-08-01 DIAGNOSIS — I428 Other cardiomyopathies: Secondary | ICD-10-CM

## 2023-08-01 DIAGNOSIS — I10 Essential (primary) hypertension: Secondary | ICD-10-CM | POA: Diagnosis not present

## 2023-08-01 LAB — CUP PACEART INCLINIC DEVICE CHECK
Battery Remaining Longevity: 63 mo
Battery Voltage: 2.96 V
Brady Statistic RV Percent Paced: 0.01 %
Date Time Interrogation Session: 20240820110428
HighPow Impedance: 55 Ohm
Implantable Lead Connection Status: 753985
Implantable Lead Implant Date: 20190204
Implantable Lead Location: 753860
Implantable Pulse Generator Implant Date: 20190204
Lead Channel Impedance Value: 342 Ohm
Lead Channel Impedance Value: 380 Ohm
Lead Channel Pacing Threshold Amplitude: 1.125 V
Lead Channel Pacing Threshold Pulse Width: 0.4 ms
Lead Channel Sensing Intrinsic Amplitude: 10.125 mV
Lead Channel Sensing Intrinsic Amplitude: 9.75 mV
Lead Channel Setting Pacing Amplitude: 2.5 V
Lead Channel Setting Pacing Pulse Width: 0.4 ms
Lead Channel Setting Sensing Sensitivity: 0.3 mV
Zone Setting Status: 755011
Zone Setting Status: 755011

## 2023-08-01 NOTE — Patient Instructions (Signed)
Medication Instructions:  Your physician recommends that you continue on your current medications as directed. Please refer to the Current Medication list given to you today.  *If you need a refill on your cardiac medications before your next appointment, please call your pharmacy*  Lab Work: BMET--TODAY If you have labs (blood work) drawn today and your tests are completely normal, you will receive your results only by: MyChart Message (if you have MyChart) OR A paper copy in the mail If you have any lab test that is abnormal or we need to change your treatment, we will call you to review the results.  Follow-Up: At [facility], you and your health needs are our priority.  As part of our continuing mission to provide you with exceptional heart care, we have created designated Provider Care Teams.  These Care Teams include your primary Cardiologist (physician) and Advanced Practice Providers (APPs -  Physician Assistants and Nurse Practitioners) who all work together to provide you with the care you need, when you need it.  We recommend signing up for the patient portal called "MyChart".  Sign up information is provided on this After Visit Summary.  MyChart is used to connect with patients for Virtual Visits (Telemedicine).  Patients are able to view lab/test results, encounter notes, upcoming appointments, etc.  Non-urgent messages can be sent to your provider as well.   To learn more about what you can do with MyChart, go to [email or web address].    Your next appointment:   6 month(s)  Provider:   Michael "Andy" Tillery, PA-C  

## 2023-08-01 NOTE — Progress Notes (Signed)
PERIOPERATIVE PRESCRIPTION FOR IMPLANTED CARDIAC DEVICE PROGRAMMING  Patient Information: Name:  [name]  DOB:  23-Sep-1966  MRN:  [id number]    Patient: Jeffrey [name] Date of Birth: December 28, 1965 Planned Procedure:  Evisceration eye  Surgeon:  Dr. Carmela Rima  Date of Procedure:  07/25/23  Cautery will be used.  Position during surgery:  supine   Please send documentation back to:  [facility] (Fax # [phone])  Device Information:  Clinic EP Physician:  Sherryl Manges, MD   Device Type:  Defibrillator Manufacturer and Phone #:  Medtronic: [phone] Pacemaker Dependent?:  No. Date of Last Device Check:  08/01/2023 Normal Device Function?:  Yes.    Electrophysiologist's Recommendations:  Have magnet available. Provide continuous ECG monitoring when magnet is used or reprogramming is to be performed.  Procedure may interfere with device function.  Magnet should be placed over device during procedure.  Per Device Clinic Standing Orders, Lenor Coffin, RN  4:33 PM 08/01/2023

## 2023-08-02 LAB — BASIC METABOLIC PANEL
BUN/Creatinine Ratio: 14 (ref 9–20)
BUN: 35 mg/dL — ABNORMAL HIGH (ref 6–24)
CO2: 16 mmol/L — ABNORMAL LOW (ref 20–29)
Calcium: 9.5 mg/dL (ref 8.7–10.2)
Chloride: 109 mmol/L — ABNORMAL HIGH (ref 96–106)
Creatinine, Ser: 2.53 mg/dL — ABNORMAL HIGH (ref 0.76–1.27)
Glucose: 210 mg/dL — ABNORMAL HIGH (ref 70–99)
Potassium: 4 mmol/L (ref 3.5–5.2)
Sodium: 142 mmol/L (ref 134–144)
eGFR: 29 mL/min/{1.73_m2} — ABNORMAL LOW (ref >59)

## 2023-08-04 NOTE — Progress Notes (Signed)
Remote ICD transmission.   

## 2023-08-07 ENCOUNTER — Other Ambulatory Visit: Payer: Self-pay

## 2023-08-07 ENCOUNTER — Encounter (HOSPITAL_COMMUNITY): Payer: Self-pay | Admitting: Ophthalmology

## 2023-08-07 NOTE — Anesthesia Preprocedure Evaluation (Signed)
Anesthesia Evaluation  Patient identified by MRN, date of birth, ID band Patient awake    Reviewed: Allergy & Precautions, H&P , NPO status , [name] Chart, lab work & pertinent test results  Airway Mallampati: II  TM Distance: >3 FB Neck ROM: Full    Dental no notable dental hx.    Pulmonary sleep apnea    Pulmonary exam normal breath sounds clear to auscultation       Cardiovascular hypertension, + CAD, + Past MI and +CHF  Normal cardiovascular exam+ Cardiac Defibrillator  Rhythm:Regular Rate:Normal  AICD, VVI 40, 99% sensed    Neuro/Psych Seizures -,  CVA  negative psych ROS   GI/Hepatic negative GI ROS, Neg liver ROS,,,  Endo/Other  diabetes    Renal/GU Renal disease  negative genitourinary   Musculoskeletal negative musculoskeletal ROS (+)    Abdominal   Peds negative pediatric ROS (+)  Hematology  (+) Blood dyscrasia, anemia   Anesthesia Other Findings   Reproductive/Obstetrics negative OB ROS                              Anesthesia Physical Anesthesia Plan  ASA: 4  Anesthesia Plan: General   Post-op Pain Management:    Induction: Intravenous  PONV Risk Score and Plan: Ondansetron and Dexamethasone  Airway Management Planned: Oral ETT  Additional Equipment:   Intra-op Plan:   Post-operative Plan: Extubation in OR  Informed Consent: I have reviewed the patients History and Physical, chart, labs and discussed the procedure including the risks, benefits and alternatives for the proposed anesthesia with the patient or authorized representative who has indicated his/her understanding and acceptance.     Dental advisory given  Plan Discussed with: CRNA  Anesthesia Plan Comments: (PAT note written 08/07/2023 by Shonna Chock, PA-C.  Patient is a 57 year old male scheduled for the above procedure. He is s/p pars plana vitrectomy with silicone oil removal from  posterior and anterior chambers, endolaser on 04/11/23. Patient rescheduled this surgery from 07/25/23 to 08/08/23.   History includes never smoker, DM2, HTN, HLD, CAD (non-obstructive, 2018), chronic systolic CHF (diagnosed 2018; Barostim Neo device implant 11/26/21), nonischemic cardiomyopathy, ICD (Medtronic [id number] Visia AF MRI VR ICD 01/15/18), dyspnea, CKD (stage IV by lab trends), anemia, diabetic foot ulcer (s/p right great and 2nd toe amputations 2017), eye surgery (right pars plana vitrectomy 07/14/20 & 04/11/23; repair hemorrhagic detached right retina 08/25/20, left pars plana vitrectomy 07/05/22 & 10/24/22), back surgery. He had had recurrent falls in setting of poor vision (last admission 07/2022) and hypoglycemia (episodes improved by 06/28/22 endocrinology follow-up).  S/p screening colonoscopy on 03/16/23. 07/01/21 MRI head showed chronic small vessel ischemic changes of the white matter with old small vessel infarctions, acute to subactue punctate infarctions on the left.    Last cardiology visit with Dr. Elease Hashimoto was on 04/12/22 and EP follow-up with Herschel Senegal, PA-C on 08/01/23 for follow-up and preoperative evaluation.  S/p Barostim implant on 11/26/21. Follow-up echo on 03/31/22 showed EF improved to 50-55% (previously 30-35%). Normal ICD function. Denied syncope, chest pain, no significant edema.  He added: "Cardiac Clearance for Evisceration of his R eye Echo 03/2022 LVEF 50-55%, Grade 1 DD (Improved from 30-35% 06/2021) )        Anesthesia Quick Evaluation

## 2023-08-07 NOTE — Progress Notes (Addendum)
Anesthesia Chart Review: Jeffrey [name]  Case: [id number] Date/Time: 08/08/23 1545   Procedure: EVISCERATION (Right)   Anesthesia type: General   Pre-op diagnosis: ocular hypertesion right eye   [facility] 08 / [facility]   Surgeons: Carmela Rima, MD       DISCUSSION: Patient is a 57 year old male scheduled for the above procedure. He is s/p pars plana vitrectomy with silicone oil removal from posterior and anterior chambers, endolaser on 04/11/23. Patient rescheduled this surgery from 07/25/23 to 08/08/23.   History includes never smoker, DM2, HTN, HLD, CAD (non-obstructive, 2018), chronic systolic CHF (diagnosed 2018; Barostim Neo device implant 11/26/21), nonischemic cardiomyopathy, ICD (Medtronic [id number] Visia AF MRI VR ICD 01/15/18), dyspnea, CKD (stage IV by lab trends), anemia, diabetic foot ulcer (s/p right great and 2nd toe amputations 2017), eye surgery (right pars plana vitrectomy 07/14/20 & 04/11/23; repair hemorrhagic detached right retina 08/25/20, left pars plana vitrectomy 07/05/22 & 10/24/22), back surgery. He had had recurrent falls in setting of poor vision (last admission 07/2022) and hypoglycemia (episodes improved by 06/28/22 endocrinology follow-up).  S/p screening colonoscopy on 03/16/23. 07/01/21 MRI head showed chronic small vessel ischemic changes of the white matter with old small vessel infarctions, acute to subactue punctate infarctions on the left.    Last cardiology visit with Dr. Elease Hashimoto was on 04/12/22 and EP follow-up with Herschel Senegal, PA-C on 08/01/23 for follow-up and preoperative evaluation.  S/p Barostim implant on 11/26/21. Follow-up echo on 03/31/22 showed EF improved to 50-55% (previously 30-35%). Normal ICD function. Denied syncope, chest pain, no significant edema.  He added: "Cardiac Clearance for Evisceration of his R eye Echo 03/2022 LVEF 50-55%, Grade 1 DD (Improved from 30-35% 06/2021) The patient is cleared for surgery from a cardiac perspective with an  estimated Low Risk of perioperative cardiac complications by the Revised Cardiac Risk Index Nedra Hai Criteria). The patient may proceed without further cardiac work up.    The patient has an implanted cardiac device and additional clearance will be required through our device clinic due to the potential for interaction with the device during surgery.    He is NOT dependent on his device. A Magnet should be used."  EP Device Clinic Perioperative ICD Recommendations: Device Information: Clinic EP Physician:  Sherryl Manges, MD  Device Type:  Defibrillator Manufacturer and Phone #:  Medtronic: [phone] Pacemaker Dependent?:  No. Date of Last Device Check:  08/01/2023       Normal Device Function?:  Yes.     Electrophysiologist's Recommendations: Have magnet available. Provide continuous ECG monitoring when magnet is used or reprogramming is to be performed.  Procedure may interfere with device function.  Magnet should be placed over device during procedure.  Eliquis 5 mg BID for CVA prophylaxis. Appears this was prescribed ~ 05/01/23 by Debria Garret, NP with [facility]. There was concern of right eye vision loss (prior 04/11/23 surgery OD acuity 20/600) and some staggering/frequent falls. Of note, with recurrent falls with admission in August 2023. Head CT then showed stable probable chronic microvascular ischemic changes superimposed on chronic small vessel infarcts in the cerebral white matter. Neck MRA July 2022 showed no significant carotid stenosis, but some study limitation by motion artifact. It appears that she did initiate a neurology referral, but this has not been scheduled yet. PAT phone RN contacted Dr. Eliane Decree office who will reach out to patient with perioperative Eliquis instructions.    A1c 8.5% on 03/28/23. He is on Jardiance 10 mg daily,  Lantus 12-14 units Q AM. Semaglutide is listed as not taking due to too much weight loss. RN staff attempting to reach his to preoperative  teaching and will inquire about last doses at that time.   Creatinine 2.53 and eGFR 29 on 08/01/23 which appears consistent with known CKD history and lab trends since at least April 2023.   Discussed above with anesthesiologist Arrie Aran, MD. Anesthesia team to evaluate on the day of surgery.  (UPDATE 08/07/23 5:00 PM: He reported last Eliquis 08/02/23 and Last Jardiance 08/05/23. He is no longer on Ozempic. He thought he had seen neurology recently, but could not recall the name.  There are no recent neurology encounters for [place] or [facility] that I see.)   VS:  BP Readings from Last 3 Encounters:  08/01/23 126/88  04/11/23 (!) 141/91  03/28/23 130/88   Pulse Readings from Last 3 Encounters:  08/01/23 72  04/11/23 64  03/28/23 82    PROVIDERS: Estevan Oaks, NP is PCP  Sherryl Manges, MD is EP cardiologist Kristeen Miss, MD is primary cardiologist  Vallery Sa, MD is nephrologist Carlus Pavlov, MD is endocrinologist, no show to 07/01/23 visit. Next visit scheduled for 09/19/23.     LABS: Most recent labs in [facility] include: Lab Results  Component Value Date   WBC 5.3 04/11/2023   HGB 13.3 04/11/2023   HCT 43.1 04/11/2023   PLT 260 04/11/2023   GLUCOSE 210 (H) 08/01/2023   ALT 20 07/24/2022   AST 20 07/24/2022   NA 142 08/01/2023   K 4.0 08/01/2023   CL 109 (H) 08/01/2023   CREATININE 2.53 (H) 08/01/2023   BUN 35 (H) 08/01/2023   CO2 16 (L) 08/01/2023   HGBA1C 8.5 (A) 03/28/2023    OTHER: Sleep Study 07/21/23: IMPRESSIONS - No significant obstructive sleep apnea occurred during this study, within normal limits (AHI = 3.0/h). - The patient had minimal or no oxygen desaturation during the study (Min O2 = 88.0%, Mean 96.1%) - The patient snored with soft snoring volume. - EKG findings include paced rhythm with PVCs. - Clinically, significant periodic limb movements did not occur during sleep. No significant associated arousals.  -  DIAGNOSIS: Normal study   EEG 07/01/21: IMPRESSION: This study is within normal limits. No seizures or epileptiform discharges were seen throughout the recording.     IMAGES: 1V PCXR 08/01/22: FINDINGS: Left AICD remains in place, unchanged. Right stimulator battery pack noted, unchanged. Heart and mediastinal contours are within normal limits. No focal opacities or effusions. No acute bony abnormality. IMPRESSION: No active cardiopulmonary disease.   CT Head/Maxillofacial 08/01/22: IMPRESSION: No acute intracranial abnormality. Decreased soft tissue swelling. Fractures as seen previously.   MRA Neck 07/02/21 (Canopy/PACS): IMPRESSION: 1. Study limitations as detailed above primarily affecting assessment of the proximal portions of the common carotid and vertebral arteries. 2. No evidence of significant cervical carotid or vertebral artery stenosis or dissection where the vessels were adequately visualized.    MRI Head 07/01/21: IMPRESSION: Background pattern of chronic small vessel ischemic changes of the white matter with old small vessel infarctions in the right external capsule, both basal ganglia and the right thalamus. Acute to subacute punctate infarctions within the pons and the left side of the splenium of the corpus callosum. No evidence of mass effect or hemorrhage.    EKG:  EKG 08/01/23: Sinus rhythm with 1st degree A-V block Left axis deviation Confirmed by Maxine Glenn [phone]) on 08/01/2023 9:29:29 AM  CV: Echo 03/31/22: IMPRESSIONS   1. Left ventricular ejection fraction, by estimation, is 50 to 55%. The  left ventricle has low normal function. The left ventricle has no regional  wall motion abnormalities. There is severe concentric left ventricular  hypertrophy. Left ventricular  diastolic parameters are consistent with Grade I diastolic dysfunction  (impaired relaxation).   2. Right ventricular systolic function is normal. The right ventricular   size is mildly enlarged. There is normal pulmonary artery systolic  pressure. The estimated right ventricular systolic pressure is 24.5 mmHg.   3. The mitral valve is grossly normal. No evidence of mitral valve  regurgitation. No evidence of mitral stenosis.   4. The aortic valve is abnormal. Aortic valve regurgitation is not  visualized. Aortic valve sclerosis is present, with no evidence of aortic  valve stenosis.  - Comparison(s): LVEF has improved from prior.  (Comparison: LVEF 20-25% 01/29/17 echo; 10-15% 01/31/17 cath; LVEF 20% dffuse hypokinesis, grade 2 DD, RV systolic functio nmoderately-severely reduced 01/14/18 echo; LVEF 30-35%, normal RVSF 06/13/20 echo; LVEF 30-35%, grade 2 DD, normal RVSF 03/30/21 echo; 07/01/21 LVEF 30-35%, LV global hypokinesis, grade 1 DD, normal RVSF)     US Carotid 03/30/21: Summary:  Right Carotid: Velocities in the right ICA are consistent with a 1-39% stenosis.  Left Carotid: Velocities in the left ICA are consistent with a 1-39% stenosis.  Vertebrals:  Bilateral vertebral arteries demonstrate antegrade flow.  Subclavians: Normal flow hemodynamics were seen in bilateral subclavian arteries.      Cardiac cath 01/31/17: 1st RPLB lesion, 60 %stenosed. Dist RCA lesion, 60 %stenosed. Prox RCA lesion, 55 %stenosed. Prox LAD lesion, 10 %stenosed. There is severe left ventricular systolic dysfunction. LV end diastolic pressure is mildly elevated. - Severe global LV dysfunction with an ejection fraction of 10-15%.  The pattern is one of a nonischemic cardiomyopathy. - Mild coronary obstructive disease with smooth 10% narrowing in the LAD; normal ramus intermediate, normal left circumflex; and RCA with 50-60% proximal stenosis and distal tapering of 60%.  Prior to giving rise to 3 small distal branches with 60% narrowing in a small inferior LV branch.  The patient's LV dysfunction is out of proportion to his CAD. - Initiation of medical therapy with carvedilol,  spironolactone, and probable initiation of angiotension receptorblocker/neprilysin inhibition therapy.  Consider short-term life-vest prior to discharge to allow for possible medication induced improvement in LV function. [S/p ICD 01/15/18]   Past Medical History:  Diagnosis Date   AICD (automatic cardioverter/defibrillator) present    Medtronic   AICD (automatic cardioverter/defibrillator) present    MDT Visia AF MRI   Anemia    CAD (coronary artery disease)    a. cath 01/31/17: 60% 1st RPLB, 60% dist RCA, 55% prox RCA, 10% pro LAD --> Rx TX.    CHF (congestive heart failure) (HCC)    Chronic systolic CHF (congestive heart failure) (HCC) 01/28/2017   1. Echo 01/29/17:  EF 20-25, normal wall motion, mild LAE // 2. EF 10-15 by [facility] 01/2017    Diabetes mellitus without complication (HCC)    type 2   Diabetic foot infection (HCC) 03/2016   RT FOOT   Dyspnea    History of kidney stones    passed   HTN (hypertension)    Hyperlipidemia    Hypertension    Myocardial infarction [facility]), although reported, NICM at cath    NICM (nonischemic cardiomyopathy) (HCC) 02/15/2017   1. Mod non-obs CAD on LHC in 01/2017 - CAD does not explain cardiomyopathy  Renal disorder    stage 4   Sleep apnea, unspecified 07/21/2023    Past Surgical History:  Procedure Laterality Date   AIR/FLUID EXCHANGE Right 07/14/2020   Procedure: AIR/FLUID EXCHANGE;  Surgeon: Carmela Rima, MD;  Location: [facility] OR;  Service: Ophthalmology;  Laterality: Right;   AIR/FLUID EXCHANGE Right 04/11/2023   Procedure: AIR/FLUID EXCHANGE;  Surgeon: Carmela Rima, MD;  Location: [facility] OR;  Service: Ophthalmology;  Laterality: Right;   AMPUTATION Right 04/01/2016   Procedure: Right Great Toe Amputation;  Surgeon: Nadara Mustard, MD;  Location: [facility] OR;  Service: Orthopedics;  Laterality: Right;   AMPUTATION Right 06/19/2016   Procedure: AMPUTATION SECOND TOE;  Surgeon: Eldred Manges, MD;  Location: [facility] OR;  Service: Orthopedics;  Laterality:  Right;   BACK SURGERY     for abscess   CARDIAC CATHETERIZATION     EYE SURGERY     ICD IMPLANT N/A 01/15/2018   Procedure: ICD IMPLANT;  Surgeon: Duke Salvia, MD;  Location: [facility] INVASIVE CV LAB;  Service: Cardiovascular;  Laterality: N/A;   INJECTION OF SILICONE OIL Right 07/14/2020   Procedure: INJECTION OF SILICONE OIL;  Surgeon: Carmela Rima, MD;  Location: [facility] OR;  Service: Ophthalmology;  Laterality: Right;   INJECTION OF SILICONE OIL Right 08/25/2020   Procedure: INJECTION OF SILICONE OIL;  Surgeon: Carmela Rima, MD;  Location: [facility] OR;  Service: Ophthalmology;  Laterality: Right;   INJECTION OF SILICONE OIL Left 07/05/2022   Procedure: INJECTION OF SILICONE OIL;  Surgeon: Carmela Rima, MD;  Location: [facility] OR;  Service: Ophthalmology;  Laterality: Left;   INSERT / REPLACE / REMOVE PACEMAKER     MEMBRANE PEEL Right 08/25/2020   Procedure: MEMBRANE PEEL;  Surgeon: Carmela Rima, MD;  Location: [facility] OR;  Service: Ophthalmology;  Laterality: Right;   MEMBRANE PEEL Left 07/05/2022   Procedure: MEMBRANECTOMY;  Surgeon: Carmela Rima, MD;  Location: [facility] OR;  Service: Ophthalmology;  Laterality: Left;   PARS PLANA VITRECTOMY Right 07/14/2020   Procedure: PARS PLANA VITRECTOMY WITH 25 GAUGE, Membranetomy, drainage of subretinal fluid;  Surgeon: Carmela Rima, MD;  Location: [facility] OR;  Service: Ophthalmology;  Laterality: Right;   PARS PLANA VITRECTOMY Right 08/25/2020   Procedure: PARS PLANA VITRECTOMY WITH 25 GAUGE;  Surgeon: Carmela Rima, MD;  Location: [facility] OR;  Service: Ophthalmology;  Laterality: Right;   PARS PLANA VITRECTOMY Left 07/05/2022   Procedure: LEFT PARS PLANA VITRECTOMY WITH 25 GAUGE;  Surgeon: Carmela Rima, MD;  Location: [facility] OR;  Service: Ophthalmology;  Laterality: Left;   PARS PLANA VITRECTOMY Left 10/25/2022   Procedure: PARS PLANA VITRECTOMY WITH 25 GAUGE;  Surgeon: Carmela Rima, MD;  Location: [facility] OR;  Service: Ophthalmology;  Laterality: Left;   PARS PLANA  VITRECTOMY Right 04/11/2023   Procedure: PARS PLANA VITRECTOMY WITH 25 GAUGE;  Surgeon: Carmela Rima, MD;  Location: [facility] OR;  Service: Ophthalmology;  Laterality: Right;   PHOTOCOAGULATION WITH LASER Right 07/14/2020   Procedure: PHOTOCOAGULATION WITH LASER;  Surgeon: Carmela Rima, MD;  Location: [facility] OR;  Service: Ophthalmology;  Laterality: Right;   PHOTOCOAGULATION WITH LASER Right 08/25/2020   Procedure: PHOTOCOAGULATION WITH LASER;  Surgeon: Carmela Rima, MD;  Location: [facility] OR;  Service: Ophthalmology;  Laterality: Right;   PHOTOCOAGULATION WITH LASER Left 07/05/2022   Procedure: PHOTOCOAGULATION WITH LASER;  Surgeon: Carmela Rima, MD;  Location: [facility] OR;  Service: Ophthalmology;  Laterality: Left;   PHOTOCOAGULATION WITH LASER Left 10/25/2022   Procedure: PHOTOCOAGULATION WITH LASER;  Surgeon: Carmela Rima, MD;  Location: [facility]  OR;  Service: Ophthalmology;  Laterality: Left;   PHOTOCOAGULATION WITH LASER Right 04/11/2023   Procedure: PHOTOCOAGULATION WITH LASER;  Surgeon: Carmela Rima, MD;  Location: [facility] OR;  Service: Ophthalmology;  Laterality: Right;   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   Procedure: REPAIR OF HEMORRHAGIC DETACHMENT;  Surgeon: Carmela Rima, MD;  Location: [facility] OR;  Service: Ophthalmology;  Laterality: Right;   RIGHT/LEFT HEART CATH AND CORONARY ANGIOGRAPHY N/A 01/31/2017   Procedure: Right/Left Heart Cath and Coronary Angiography;  Surgeon: Lennette Bihari, MD;  Location: [facility] INVASIVE CV LAB;  Service: Cardiovascular;  Laterality: N/A;   SILICON OIL REMOVAL Right 08/25/2020   Procedure: SILICON OIL REMOVAL;  Surgeon: Carmela Rima, MD;  Location: [facility] OR;  Service: Ophthalmology;  Laterality: Right;   SILICON OIL REMOVAL Left 10/25/2022   Procedure: SILICON OIL REMOVAL;  Surgeon: Carmela Rima, MD;  Location: [facility] OR;  Service: Ophthalmology;  Laterality: Left;   SILICON OIL REMOVAL Right 04/11/2023   Procedure: SILICON OIL REMOVAL;   Surgeon: Carmela Rima, MD;  Location: [facility] OR;  Service: Ophthalmology;  Laterality: Right;    MEDICATIONS: No current facility-administered medications for this encounter.    acetaminophen (TYLENOL) 500 MG tablet   acetaZOLAMIDE ER (DIAMOX) 500 MG capsule   albuterol (VENTOLIN HFA) 108 (90 Base) MCG/ACT inhaler   apixaban (ELIQUIS) 5 MG TABS tablet   brimonidine (ALPHAGAN) 0.2 % ophthalmic solution   carvedilol (COREG) 12.5 MG tablet   cetirizine (ZYRTEC) 10 MG tablet   empagliflozin (JARDIANCE) 10 MG TABS tablet   ferrous sulfate 325 (65 FE) MG tablet   furosemide (LASIX) 40 MG tablet   gabapentin (NEURONTIN) 300 MG capsule   hydrALAZINE (APRESOLINE) 50 MG tablet   Insulin Glargine Solostar (LANTUS) 100 UNIT/ML Solostar Pen   isosorbide mononitrate (IMDUR) 30 MG 24 hr tablet   lactulose (CHRONULAC) 10 GM/15ML solution   losartan (COZAAR) 50 MG tablet   magnesium oxide (MAG-OX) 400 (240 Mg) MG tablet   ondansetron (ZOFRAN) 4 MG tablet   rosuvastatin (CRESTOR) 40 MG tablet   Semaglutide,0.25 or 0.5MG /DOS, 2 MG/3ML SOPN   tamsulosin (FLOMAX) 0.4 MG CAPS capsule   Vitamin D, Ergocalciferol, (DRISDOL) 1.25 MG (50000 UNIT) CAPS capsule   Accu-Chek Softclix Lancets lancets   Accu-Chek Softclix Lancets lancets   Blood Glucose Monitoring Suppl (ACCU-CHEK GUIDE) w/Device KIT   Blood Glucose Monitoring Suppl (ACCU-CHEK GUIDE) w/Device KIT   brimonidine (ALPHAGAN P) 0.1 % SOLN   Continuous Glucose Receiver (FREESTYLE LIBRE 2 READER) DEVI   Continuous Glucose Sensor (FREESTYLE LIBRE 2 SENSOR) MISC   Elastic Bandages & Supports (T.E.D. KNEE LENGTH/S-LONG) MISC   glucose blood (ACCU-CHEK GUIDE) test strip   glucose blood test strip   Insulin Pen Needle (B-D UF III MINI PEN NEEDLES) 31G X 5 MM MISC   Insulin Pen Needle (BD PEN NEEDLE NANO U/F) 32G X 4 MM MISC   lisinopril (ZESTRIL) 10 MG tablet   ofloxacin (OCUFLOX) 0.3 % ophthalmic solution   potassium chloride (MICRO-K) 10 MEQ CR  capsule     Shonna Chock, PA-C Surgical Short Stay/Anesthesiology [facility] Phone [phone] [facility] Phone [phone] 08/07/2023 3:48 PM

## 2023-08-07 NOTE — Pre-Procedure Instructions (Signed)
PCP - Estevan Oaks, NP Cardiologist - Vesta Mixer, MD Electrophysiology Cardiology - Sherryl Manges, MD  PPM/ICD - Medtronic Device Orders - Y in notes Rep Notified - Y (emailed on  08/07/23)  EKG - 08/01/23 Stress Test - 07/31/14 ECHO - 03/31/22 Cardiac Cath - 01/31/14  OZEMPIC: No longer taking Jardiance: Last dose 08/05/23  Fasting Blood Sugar - 110-120's Checks Blood Sugar 2/day  Blood Thinner Instructions: I called the office to get instructions on Eliquis and for surgery orders. Spoke with Ginger. She will contact patient with instructions.  Last dose of Eliquis: 08/02/23  Anesthesia review: Y  Patient verbally denies any shortness of breath, fever, cough and chest pain during phone call   -------------  SDW INSTRUCTIONS given:  Your procedure is scheduled on Tuesday, August 27th.  Report to [facility] Main Entrance "A" at 1330 P.M., and check in at the Admitting office.  Call this number if you have problems the morning of surgery:  [phone]   Remember:  Do not eat after midnight the night before your surgery  You may drink clear liquids until 1300 the afternoon of your surgery.   Clear liquids allowed are: Water, Non-Citrus Juices (without pulp), Carbonated Beverages, Clear Tea, Black Coffee Only, and Gatorade    Take these medicines the morning of surgery with A SIP OF WATER  Eye drops  carvedilol (COREG)  hydrALAZINE (APRESOLINE)  isosorbide mononitrate (IMDUR)  ondansetron (ZOFRAN)  acetaminophen (TYLENOL)-if needed albuterol (VENTOLIN HFA)-if needed (please bring with you on the day of surgery)  **(JARDIANCE)** Do not take today or tomorrow's dose  **Insulin Glargine Solostar (LANTUS)**Take 1/2 of your normal dose the morning of surgery  ** PLEASE check your blood sugar the morning of your surgery when you wake up and every 2 hours until you get to the Short Stay unit.  If your blood sugar is less than 70 mg/dL, you will need to treat for  low blood sugar: Do not take insulin. Treat a low blood sugar (less than 70 mg/dL) with  cup of clear juice (cranberry or apple), 4 glucose tablets, OR glucose gel. Recheck blood sugar in 15 minutes after treatment (to make sure it is greater than 70 mg/dL). If your blood sugar is not greater than 70 mg/dL on recheck, call [phone] for further instructions.   As of today, STOP taking any Aspirin (unless otherwise instructed by your surgeon) Aleve, Naproxen, Ibuprofen, Motrin, Advil, Goody's, BC's, all herbal medications, fish oil, and all vitamins.                      Do not wear jewelry, make up, or nail polish            Do not wear lotions, powders, perfumes/colognes, or deodorant.            Do not shave 48 hours prior to surgery.  Men may shave face and neck.            Do not bring valuables to the hospital.            [facility] is not responsible for any belongings or valuables.  Do NOT Smoke (Tobacco/Vaping) 24 hours prior to your procedure If you use a CPAP at night, you may bring all equipment for your overnight stay.   Contacts, glasses, dentures or bridgework may not be worn into surgery.      For patients admitted to the hospital, discharge time will be determined by your  treatment team.   Patients discharged the day of surgery will not be allowed to drive home, and someone needs to stay with them for 24 hours.    Special instructions:   [facility]- Preparing For Surgery  Before surgery, you can play an important role. Because skin is not sterile, your skin needs to be as free of germs as possible. You can reduce the number of germs on your skin by washing with CHG (chlorahexidine gluconate) Soap before surgery.  CHG is an antiseptic cleaner which kills germs and bonds with the skin to continue killing germs even after washing.    Oral Hygiene is also important to reduce your risk of infection.  Remember - BRUSH YOUR TEETH THE MORNING OF SURGERY WITH YOUR REGULAR  TOOTHPASTE  Please do not use if you have an allergy to CHG or antibacterial soaps. If your skin becomes reddened/irritated stop using the CHG.  Do not shave (including legs and underarms) for at least 48 hours prior to first CHG shower. It is OK to shave your face.  Please follow these instructions carefully.   Shower the NIGHT BEFORE SURGERY and the MORNING OF SURGERY with DIAL Soap.   Pat yourself dry with a CLEAN TOWEL.  Wear CLEAN PAJAMAS to bed the night before surgery  Place CLEAN SHEETS on your bed the night of your first shower and DO NOT SLEEP WITH PETS.   Day of Surgery: Please shower morning of surgery  Wear Clean/Comfortable clothing the morning of surgery Do not apply any deodorants/lotions.   Remember to brush your teeth WITH YOUR REGULAR TOOTHPASTE.   Questions were answered. Patient verbalized understanding of instructions.

## 2023-08-08 ENCOUNTER — Encounter (HOSPITAL_COMMUNITY): Payer: Self-pay | Admitting: Ophthalmology

## 2023-08-08 ENCOUNTER — Other Ambulatory Visit: Payer: Self-pay

## 2023-08-08 ENCOUNTER — Ambulatory Visit (HOSPITAL_COMMUNITY)
Admission: RE | Admit: 2023-08-08 | Discharge: 2023-08-08 | Disposition: A | Discharge status: Home / Self Care | Payer: 59 | Attending: Ophthalmology | Admitting: Ophthalmology

## 2023-08-08 ENCOUNTER — Encounter (HOSPITAL_COMMUNITY)
Admission: RE | Disposition: A | Discharge status: Home / Self Care | Payer: Self-pay | Source: Home / Self Care | Attending: Ophthalmology

## 2023-08-08 ENCOUNTER — Ambulatory Visit (HOSPITAL_COMMUNITY): Payer: 59 | Admitting: Physician Assistant

## 2023-08-08 ENCOUNTER — Ambulatory Visit (HOSPITAL_BASED_OUTPATIENT_CLINIC_OR_DEPARTMENT_OTHER): Payer: 59 | Admitting: Physician Assistant

## 2023-08-08 DIAGNOSIS — I252 Old myocardial infarction: Secondary | ICD-10-CM | POA: Diagnosis not present

## 2023-08-08 DIAGNOSIS — H40051 Ocular hypertension, right eye: Secondary | ICD-10-CM | POA: Diagnosis not present

## 2023-08-08 DIAGNOSIS — G4730 Sleep apnea, unspecified: Secondary | ICD-10-CM | POA: Diagnosis not present

## 2023-08-08 DIAGNOSIS — Z95810 Presence of automatic (implantable) cardiac defibrillator: Secondary | ICD-10-CM | POA: Insufficient documentation

## 2023-08-08 DIAGNOSIS — I130 Hypertensive heart and chronic kidney disease with heart failure and stage 1 through stage 4 chronic kidney disease, or unspecified chronic kidney disease: Secondary | ICD-10-CM | POA: Diagnosis not present

## 2023-08-08 DIAGNOSIS — N184 Chronic kidney disease, stage 4 (severe): Secondary | ICD-10-CM | POA: Diagnosis not present

## 2023-08-08 DIAGNOSIS — R739 Hyperglycemia, unspecified: Secondary | ICD-10-CM

## 2023-08-08 DIAGNOSIS — H5711 Ocular pain, right eye: Secondary | ICD-10-CM | POA: Diagnosis not present

## 2023-08-08 DIAGNOSIS — I2510 Atherosclerotic heart disease of native coronary artery without angina pectoris: Secondary | ICD-10-CM | POA: Insufficient documentation

## 2023-08-08 DIAGNOSIS — I5022 Chronic systolic (congestive) heart failure: Secondary | ICD-10-CM

## 2023-08-08 DIAGNOSIS — H4089 Other specified glaucoma: Secondary | ICD-10-CM

## 2023-08-08 DIAGNOSIS — E1122 Type 2 diabetes mellitus with diabetic chronic kidney disease: Secondary | ICD-10-CM

## 2023-08-08 HISTORY — PX: EVISCERATION: SHX1539

## 2023-08-08 LAB — CBC
HCT: 37.9 % — ABNORMAL LOW (ref 39.0–52.0)
Hemoglobin: 11.5 g/dL — ABNORMAL LOW (ref 13.0–17.0)
MCH: 25.4 pg — ABNORMAL LOW (ref 26.0–34.0)
MCHC: 30.3 g/dL (ref 30.0–36.0)
MCV: 83.7 fL (ref 80.0–100.0)
Platelets: 218 10*3/uL (ref 150–400)
RBC: 4.53 MIL/uL (ref 4.22–5.81)
RDW: 15.5 % (ref 11.5–15.5)
WBC: 6.1 10*3/uL (ref 4.0–10.5)
nRBC: 0 % (ref 0.0–0.2)

## 2023-08-08 LAB — GLUCOSE, CAPILLARY
Glucose-Capillary: 237 mg/dL — ABNORMAL HIGH (ref 70–99)
Glucose-Capillary: 176 mg/dL — ABNORMAL HIGH (ref 70–99)
Glucose-Capillary: 146 mg/dL — ABNORMAL HIGH (ref 70–99)
Glucose-Capillary: 122 mg/dL — ABNORMAL HIGH (ref 70–99)

## 2023-08-08 SURGERY — REPAIR, EVISCERATION, ABDOMEN
Anesthesia: General | Site: Eye | Laterality: Right

## 2023-08-08 MED ORDER — CHLORHEXIDINE GLUCONATE 0.12 % MT SOLN
15.0000 mL | OROMUCOSAL | Status: AC
  Administered 2023-08-08: 15 mL via OROMUCOSAL
  Filled 2023-08-08: qty 15

## 2023-08-08 MED ORDER — SUGAMMADEX SODIUM 200 MG/2ML IV SOLN
INTRAVENOUS | Status: DC | PRN
  Administered 2023-08-08: 600 mg via INTRAVENOUS

## 2023-08-08 MED ORDER — STERILE WATER FOR INJECTION IJ SOLN
INTRAMUSCULAR | Status: DC | PRN
  Administered 2023-08-08: 20 mL

## 2023-08-08 MED ORDER — INSULIN ASPART 100 UNIT/ML IJ SOLN
0.0000 [IU] | INTRAMUSCULAR | Status: AC | PRN
  Administered 2023-08-08: 2 [IU] via SUBCUTANEOUS
  Administered 2023-08-08: 6 [IU] via SUBCUTANEOUS
  Filled 2023-08-08: qty 1

## 2023-08-08 MED ORDER — PROPOFOL 10 MG/ML IV BOLUS
INTRAVENOUS | Status: DC | PRN
  Administered 2023-08-08: 70 mg via INTRAVENOUS
  Administered 2023-08-08: 20 mg via INTRAVENOUS
  Administered 2023-08-08: 10 mg via INTRAVENOUS
  Administered 2023-08-08: 50 mg via INTRAVENOUS

## 2023-08-08 MED ORDER — SODIUM HYALURONATE 10 MG/ML IO SOLUTION
PREFILLED_SYRINGE | INTRAOCULAR | Status: AC
  Filled 2023-08-08: qty 0.85

## 2023-08-08 MED ORDER — HYALURONIDASE HUMAN 150 UNIT/ML IJ SOLN
INTRAMUSCULAR | Status: AC
  Filled 2023-08-08: qty 1

## 2023-08-08 MED ORDER — ROCURONIUM BROMIDE 10 MG/ML (PF) SYRINGE
PREFILLED_SYRINGE | INTRAVENOUS | Status: AC
  Filled 2023-08-08: qty 10

## 2023-08-08 MED ORDER — TRIAMCINOLONE ACETONIDE 40 MG/ML IJ SUSP
INTRAMUSCULAR | Status: AC
  Filled 2023-08-08: qty 5

## 2023-08-08 MED ORDER — TETRACAINE HCL 0.5 % OP SOLN
OPHTHALMIC | Status: AC
  Filled 2023-08-08: qty 4

## 2023-08-08 MED ORDER — CEFAZOLIN SUBCONJUNCTIVAL INJECTION 100 MG/0.5 ML
INJECTION | SUBCONJUNCTIVAL | Status: DC | PRN
  Administered 2023-08-08: 100 mg via SUBCONJUNCTIVAL

## 2023-08-08 MED ORDER — MIDAZOLAM HCL 2 MG/2ML IJ SOLN
INTRAMUSCULAR | Status: DC | PRN
  Administered 2023-08-08: 1 mg via INTRAVENOUS

## 2023-08-08 MED ORDER — LIDOCAINE HCL 2 % IJ SOLN
INTRAMUSCULAR | Status: AC
  Filled 2023-08-08: qty 20

## 2023-08-08 MED ORDER — LIDOCAINE HCL 2 % IJ SOLN: INTRAMUSCULAR | Status: DC | PRN

## 2023-08-08 MED ORDER — CEFAZOLIN SUBCONJUNCTIVAL INJECTION 100 MG/0.5 ML
100.0000 mg | INJECTION | SUBCONJUNCTIVAL | Status: AC
  Filled 2023-08-08: qty 5

## 2023-08-08 MED ORDER — LIDOCAINE 2% (20 MG/ML) 5 ML SYRINGE
INTRAMUSCULAR | Status: DC | PRN
  Administered 2023-08-08: 80 mg via INTRAVENOUS

## 2023-08-08 MED ORDER — PHENYLEPHRINE HCL-NACL 20-0.9 MG/250ML-% IV SOLN
INTRAVENOUS | Status: DC | PRN
  Administered 2023-08-08: 30 ug/min via INTRAVENOUS

## 2023-08-08 MED ORDER — LIDOCAINE 2% (20 MG/ML) 5 ML SYRINGE
INTRAMUSCULAR | Status: AC
  Filled 2023-08-08: qty 5

## 2023-08-08 MED ORDER — ONDANSETRON HCL 4 MG/2ML IJ SOLN
INTRAMUSCULAR | Status: AC
  Filled 2023-08-08: qty 2

## 2023-08-08 MED ORDER — OXYCODONE HCL 5 MG PO TABS: 5.0000 mg | ORAL_TABLET | Freq: Four times a day (QID) | ORAL | 0 refills | Status: AC | PRN

## 2023-08-08 MED ORDER — TOBRAMYCIN-DEXAMETHASONE 0.3-0.1 % OP OINT
TOPICAL_OINTMENT | OPHTHALMIC | Status: AC
  Filled 2023-08-08: qty 3.5

## 2023-08-08 MED ORDER — ATROPINE SULFATE 1 % OP SOLN
OPHTHALMIC | Status: AC
  Filled 2023-08-08: qty 5

## 2023-08-08 MED ORDER — CEFAZOLIN SODIUM-DEXTROSE 2-3 GM-%(50ML) IV SOLR
INTRAVENOUS | Status: DC | PRN
  Administered 2023-08-08: 2 g via INTRAVENOUS

## 2023-08-08 MED ORDER — BSS IO SOLN
INTRAOCULAR | Status: DC | PRN
  Administered 2023-08-08: 15 mL

## 2023-08-08 MED ORDER — FENTANYL CITRATE (PF) 250 MCG/5ML IJ SOLN
INTRAMUSCULAR | Status: DC | PRN
  Administered 2023-08-08 (×2): 50 ug via INTRAVENOUS
  Administered 2023-08-08: 100 ug via INTRAVENOUS

## 2023-08-08 MED ORDER — BUPIVACAINE HCL (PF) 0.75 % IJ SOLN
INTRAMUSCULAR | Status: AC
  Filled 2023-08-08: qty 10

## 2023-08-08 MED ORDER — ROCURONIUM BROMIDE 10 MG/ML (PF) SYRINGE
PREFILLED_SYRINGE | INTRAVENOUS | Status: DC | PRN
  Administered 2023-08-08: 60 mg via INTRAVENOUS

## 2023-08-08 MED ORDER — DEXAMETHASONE SODIUM PHOSPHATE 10 MG/ML IJ SOLN
INTRAMUSCULAR | Status: DC | PRN
  Administered 2023-08-08: 10 mg

## 2023-08-08 MED ORDER — SODIUM CHLORIDE 0.9 % IV SOLN: INTRAVENOUS | Status: DC

## 2023-08-08 MED ORDER — DEXAMETHASONE SODIUM PHOSPHATE 10 MG/ML IJ SOLN
INTRAMUSCULAR | Status: AC
  Filled 2023-08-08: qty 1

## 2023-08-08 MED ORDER — CEFTAZIDIME 1 G IJ SOLR
INTRAMUSCULAR | Status: AC
  Filled 2023-08-08: qty 1

## 2023-08-08 MED ORDER — TOBRAMYCIN-DEXAMETHASONE 0.3-0.1 % OP SUSP
OPHTHALMIC | Status: DC | PRN
  Administered 2023-08-08: 1 [drp] via OPHTHALMIC

## 2023-08-08 MED ORDER — BSS IO SOLN
INTRAOCULAR | Status: AC
  Filled 2023-08-08: qty 15

## 2023-08-08 MED ORDER — PROPOFOL 10 MG/ML IV BOLUS
INTRAVENOUS | Status: AC
  Filled 2023-08-08: qty 20

## 2023-08-08 MED ORDER — OFLOXACIN 0.3 % OP SOLN
1.0000 [drp] | OPHTHALMIC | Status: AC | PRN
  Administered 2023-08-08 (×3): 1 [drp] via OPHTHALMIC
  Filled 2023-08-08: qty 5

## 2023-08-08 MED ORDER — OXYCODONE HCL 5 MG PO TABS
ORAL_TABLET | ORAL | Status: AC
  Filled 2023-08-08: qty 1

## 2023-08-08 MED ORDER — PROPARACAINE HCL 0.5 % OP SOLN
1.0000 [drp] | OPHTHALMIC | Status: AC | PRN
  Administered 2023-08-08 (×2): 1 [drp] via OPHTHALMIC
  Filled 2023-08-08: qty 15

## 2023-08-08 MED ORDER — MIDAZOLAM HCL 2 MG/2ML IJ SOLN
INTRAMUSCULAR | Status: AC
  Filled 2023-08-08: qty 2

## 2023-08-08 MED ORDER — STERILE WATER FOR INJECTION IJ SOLN
INTRAMUSCULAR | Status: AC
  Filled 2023-08-08: qty 10

## 2023-08-08 MED ORDER — FENTANYL CITRATE (PF) 250 MCG/5ML IJ SOLN
INTRAMUSCULAR | Status: AC
  Filled 2023-08-08: qty 5

## 2023-08-08 MED ORDER — SODIUM CHLORIDE (PF) 0.9 % IJ SOLN
INTRAMUSCULAR | Status: AC
  Filled 2023-08-08: qty 10

## 2023-08-08 MED ORDER — OXYCODONE HCL 5 MG PO TABS
5.0000 mg | ORAL_TABLET | Freq: Once | ORAL | Status: AC
  Administered 2023-08-08: 5 mg via ORAL

## 2023-08-08 MED ORDER — POLYMYXIN B SULFATE 500000 UNITS IJ SOLR
INTRAMUSCULAR | Status: AC
  Filled 2023-08-08: qty 10

## 2023-08-08 SURGICAL SUPPLY — 67 items
ALCOHOL 70% 16 OZ (MISCELLANEOUS) ×2 IMPLANT
APL SRG 3 HI ABS STRL LF PLS (MISCELLANEOUS)
APL SWBSTK 6 STRL LF DISP (MISCELLANEOUS) ×1
APPLICATOR COTTON TIP 6 STRL (MISCELLANEOUS) ×2 IMPLANT
APPLICATOR COTTON TIP 6IN STRL (MISCELLANEOUS) ×1
APPLICATOR DR MATTHEWS STRL (MISCELLANEOUS) IMPLANT
BAG COUNTER SPONGE SURGICOUNT (BAG) ×2 IMPLANT
BAG ISL DRAPE 18X18 STRL (DRAPES)
BAG ISOLATION DRAPE 18X18 (DRAPES) IMPLANT
BAG SPNG CNTER NS LX DISP (BAG)
BAND WRIST GAS GREEN (MISCELLANEOUS) IMPLANT
BLADE EYE CATARACT 19 1.4 BEAV (BLADE) IMPLANT
BLADE MVR KNIFE 20G (BLADE) IMPLANT
BLADE SURG 12 STRL SS (BLADE) IMPLANT
BLADE SURG 15 STRL LF DISP TIS (BLADE) ×2 IMPLANT
BLADE SURG 15 STRL SS (BLADE) ×1
BNDG EYE OVAL 2 1/8 X 2 5/8 (GAUZE/BANDAGES/DRESSINGS) IMPLANT
CAUTERY EYE LOW TEMP 1300F FIN (OPHTHALMIC RELATED) IMPLANT
CONFORMER OPHTHALMIC LG W/HOLE (MISCELLANEOUS) IMPLANT
CONFORMER OPHTHALMIC MD W/HOLE (MISCELLANEOUS) IMPLANT
CONFORMER OPHTHALMIC SM W-HOLE (MISCELLANEOUS) IMPLANT
CORD BIPOLAR FORCEPS 12FT (ELECTRODE) ×2 IMPLANT
COVER SURGICAL LIGHT HANDLE (MISCELLANEOUS) ×2 IMPLANT
DRAPE INCISE 51X51 W/FILM STRL (DRAPES) ×2 IMPLANT
DRAPE OPHTHALMIC 40X48 W POUCH (DRAPES) ×2 IMPLANT
DRAPE OPHTHALMIC 77X100 STRL (CUSTOM PROCEDURE TRAY) ×2 IMPLANT
DRAPE RETRACTOR (MISCELLANEOUS) IMPLANT
ELECT NDL BLADE 2-5/6 (NEEDLE) ×2 IMPLANT
ELECT NEEDLE BLADE 2-5/6 (NEEDLE) ×1 IMPLANT
ERASER HMR WETFIELD 23G BP (MISCELLANEOUS) IMPLANT
GAUZE SPONGE 2X2 STRL 8-PLY (GAUZE/BANDAGES/DRESSINGS) IMPLANT
GLOVE SURG SYN 7.5 E (GLOVE) ×1 IMPLANT
GLOVE SURG SYN 7.5 PF PI (GLOVE) ×2 IMPLANT
GOWN STRL REUS W/ TWL LRG LVL3 (GOWN DISPOSABLE) ×2 IMPLANT
GOWN STRL REUS W/TWL LRG LVL3 (GOWN DISPOSABLE) ×1
IMPL MEDPOR SPHERE EYE 18MM (Ophthalmic Related) IMPLANT
IMPLANT MEDPOR SPHERE EYE 18MM (Ophthalmic Related) ×1 IMPLANT
KIT BASIN OR (CUSTOM PROCEDURE TRAY) ×2 IMPLANT
LENS BIOM SUPER VIEW SET DISP (MISCELLANEOUS) IMPLANT
NDL 27GX1/2 REG BEVEL ECLIP (NEEDLE) ×2 IMPLANT
NEEDLE 27GX1/2 REG BEVEL ECLIP (NEEDLE) ×1 IMPLANT
NS IRRIG 1000ML POUR BTL (IV SOLUTION) ×2 IMPLANT
PACK CATARACT CUSTOM (CUSTOM PROCEDURE TRAY) ×2 IMPLANT
PACK VITRECTOMY CUSTOM (CUSTOM PROCEDURE TRAY) ×2 IMPLANT
PAD ARMBOARD 7.5X6 YLW CONV (MISCELLANEOUS) ×4 IMPLANT
PENCIL BUTTON HOLSTER BLD 10FT (ELECTRODE) ×2 IMPLANT
SHIELD EYE LENSE ONLY DISP (GAUZE/BANDAGES/DRESSINGS) IMPLANT
SOL ANTI FOG 6CC (MISCELLANEOUS) ×2 IMPLANT
SPEAR EYE SURG WECK-CEL (MISCELLANEOUS) IMPLANT
SPONGE SURGIFOAM ABS GEL 12-7 (HEMOSTASIS) IMPLANT
SUCTION TUBE FRAZIER 8FR DISP (SUCTIONS) IMPLANT
SUT CHROMIC 7 0 TG140 8 (SUTURE) IMPLANT
SUT ETHILON 10 0 CS140 6 (SUTURE) IMPLANT
SUT ETHILON 2 0 FS 18 (SUTURE) IMPLANT
SUT ETHILON 4 0 P 3 18 (SUTURE) IMPLANT
SUT ETHILON 5.0 S-24 (SUTURE) IMPLANT
SUT ETHILON 6 0 P 1 (SUTURE) IMPLANT
SUT ETHILON 9 0 TG140 8 (SUTURE) IMPLANT
SUT SILK 2 0 PERMA HAND 18 BK (SUTURE) ×2 IMPLANT
SUT SILK 4 0 RB 1 (SUTURE) ×2 IMPLANT
SUT VICRYL 6 0 S 29 12 (SUTURE) IMPLANT
SUT VICRYL 7 0 TG140 8 (SUTURE) ×2 IMPLANT
SUT VICRYL ABS 6-0 S29 18IN (SUTURE) IMPLANT
SYR TB 1ML LUER SLIP (SYRINGE) ×2 IMPLANT
TOWEL GREEN STERILE FF (TOWEL DISPOSABLE) ×4 IMPLANT
TUBE CONNECTING 12X1/4 (SUCTIONS) ×2 IMPLANT
WATER STERILE IRR 1000ML POUR (IV SOLUTION) ×2 IMPLANT

## 2023-08-08 NOTE — Discharge Instructions (Signed)
DO NOT SLEEP ON BACK, SLEEP WITH HEAD ELEVATED.  TAKE OXYCODONE EVERY 6 - 8 HOURS FOR PAIN.    DURING DAY KEEP UPRIGHT

## 2023-08-08 NOTE — Anesthesia Postprocedure Evaluation (Signed)
Anesthesia Post Note  Patient: Jeffrey [name]  Procedure(s) Performed: EVISCERATION WITH IMPLANTATION OF INTRAOCCULAR IMPLANT (Right: Eye)     Patient location during evaluation: PACU Anesthesia Type: General Level of consciousness: awake and alert Pain management: pain level controlled Vital Signs Assessment: post-procedure vital signs reviewed and stable Respiratory status: spontaneous breathing, nonlabored ventilation and respiratory function stable Cardiovascular status: blood pressure returned to baseline and stable Postop Assessment: no apparent nausea or vomiting Anesthetic complications: no  No notable events documented.  Last Vitals:  Vitals:   08/08/23 1930 08/08/23 1935  BP: 119/84 136/85  Pulse: 67 69  Resp: 12 14  Temp: 36.7 C   SpO2: 94% 94%    Last Pain:  Vitals:   08/08/23 1915  TempSrc:   PainSc: 0-No pain                 Lindel Marcell,W. EDMOND

## 2023-08-08 NOTE — Transfer of Care (Signed)
Immediate Anesthesia Transfer of Care Note  Patient: Jeffrey [name]  Procedure(s) Performed: EVISCERATION WITH IMPLANTATION OF INTRAOCCULAR IMPLANT (Right: Eye)  Patient Location: PACU  Anesthesia Type:General  Level of Consciousness: awake and drowsy  Airway & Oxygen Therapy: Patient Spontanous Breathing  Post-op Assessment: Report given to RN, Post -op Vital signs reviewed and stable, and Patient moving all extremities  Post vital signs: Reviewed and stable  Last Vitals:  Vitals Value Taken Time  BP 133/85 08/08/23 1855  Temp    Pulse 67 08/08/23 1857  Resp 13 08/08/23 1857  SpO2 98 % 08/08/23 1857  Vitals shown include unfiled device data.  Last Pain:  Vitals:   08/08/23 1410  TempSrc: Oral  PainSc: 0-No pain      Patients Stated Pain Goal: 0 (08/08/23 1410)  Complications: No notable events documented.

## 2023-08-08 NOTE — Brief Op Note (Signed)
08/08/2023  7:14 PM  PATIENT:  Jeffrey [name]  57 y.o. 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  PROCEDURE:  Procedure(s): EVISCERATION WITH IMPLANTATION OF INTRAOCCULAR IMPLANT (Right)  SURGEON:  Surgeons and Role:    * Carmela Rima, MD - Primary  PHYSICIAN ASSISTANT:   ASSISTANTS: none   ANESTHESIA:   local and general  EBL:  25 mL   BLOOD ADMINISTERED:none  DRAINS: none   LOCAL MEDICATIONS USED:  MARCAINE    and LIDOCAINE   SPECIMEN:  Source of Specimen:  Uveal contents  DISPOSITION OF SPECIMEN:  PATHOLOGY  COUNTS:  YES  TOURNIQUET:  * No tourniquets in log *  DICTATION: .Note written in EPIC  PLAN OF CARE: Discharge to home after PACU  PATIENT DISPOSITION:  PACU - hemodynamically stable.   Delay start of Pharmacological VTE agent (>24hrs) due to surgical blood loss or risk of bleeding: not applicable

## 2023-08-08 NOTE — Anesthesia Procedure Notes (Signed)
Procedure Name: Intubation Date/Time: 08/08/2023 4:43 PM  Performed by: Kayleen Memos, CRNAPre-anesthesia Checklist: Patient identified, Emergency Drugs available, Suction available and Patient being monitored Patient Re-evaluated:Patient Re-evaluated prior to induction Oxygen Delivery Method: Circle System Utilized Preoxygenation: Pre-oxygenation with 100% oxygen Induction Type: IV induction Ventilation: Mask ventilation without difficulty Laryngoscope Size: Mac and 4 Grade View: Grade II Tube type: Reinforced Tube size: 7.5 mm Number of attempts: 1 Airway Equipment and Method: Stylet and Oral airway Placement Confirmation: ETT inserted through vocal cords under direct vision, positive ETCO2 and breath sounds checked- equal and bilateral Secured at: 22 cm Tube secured with: Tape Dental Injury: Teeth and Oropharynx as per pre-operative assessment

## 2023-08-08 NOTE — Progress Notes (Signed)
Anesthesia made aware of ICD. No new orders at this time.

## 2023-08-08 NOTE — H&P (Signed)
Date of examination:  08/11/2023  Indication for surgery: panuveitis and right eye pain  Pertinent past medical history:  Past Medical History:  Diagnosis Date   AICD (automatic cardioverter/defibrillator) present    Medtronic   AICD (automatic cardioverter/defibrillator) present    MDT Visia AF MRI   Anemia    CAD (coronary artery disease)    a. cath 01/31/17: 60% 1st RPLB, 60% dist RCA, 55% prox RCA, 10% pro LAD --> Rx TX.    CHF (congestive heart failure) (HCC)    Chronic systolic CHF (congestive heart failure) (HCC) 01/28/2017   1. Echo 01/29/17:  EF 20-25, normal wall motion, mild LAE // 2. EF 10-15 by [facility] 01/2017    Diabetes mellitus without complication (HCC)    type 2   Diabetic foot infection (HCC) 03/2016   RT FOOT   Dyspnea    History of kidney stones    passed   HTN (hypertension)    Hyperlipidemia    Hypertension    Myocardial infarction [facility]), although reported, NICM at cath    NICM (nonischemic cardiomyopathy) (HCC) 02/15/2017   1. Mod non-obs CAD on LHC in 01/2017 - CAD does not explain cardiomyopathy   Renal disorder    stage 4   Sleep apnea, unspecified 07/21/2023    Pertinent ocular history:  neovascular glaucoma right eye  Pertinent family history:  Family History  Problem Relation Age of Onset   Diabetes Mother    Hypertension Mother    Diabetes Father    Heart attack Father    Diabetes Sister    Heart attack Maternal Grandmother    Colon cancer Neg Hx    Colon polyps Neg Hx    Esophageal cancer Neg Hx    Rectal cancer Neg Hx    Stomach cancer Neg Hx     General:  Healthy appearing patient in no distress.     Eyes:    Acuity OD NLP   External: Within normal limits      Anterior segment: NVI       Fundus: No view      Impression: panuveitis and right eye pain  Plan:   Evisceration right eye  Carmela Rima, MD

## 2023-08-09 ENCOUNTER — Other Ambulatory Visit: Payer: Self-pay

## 2023-08-09 ENCOUNTER — Encounter (HOSPITAL_COMMUNITY): Payer: Self-pay | Admitting: Ophthalmology

## 2023-08-09 MED ORDER — OFLOXACIN 0.3 % OP SOLN
1.0000 [drp] | Freq: Four times a day (QID) | OPHTHALMIC | 1 refills | Status: AC
  Filled 2023-08-09: qty 5, 25d supply, fill #0
  Filled 2023-08-28: qty 5, 25d supply, fill #1

## 2023-08-09 MED ORDER — PREDNISOLONE ACETATE 1 % OP SUSP
OPHTHALMIC | 1 refills | Status: AC
  Filled 2023-08-09: qty 5, 58d supply, fill #0
  Filled 2023-09-29: qty 10, 58d supply, fill #1
  Filled 2023-11-30 – 2023-12-01 (×2): qty 5, 25d supply, fill #2

## 2023-08-09 MED ORDER — NEOMYCIN-POLYMYXIN-DEXAMETH 3.5-10000-0.1 OP OINT
1.0000 | TOPICAL_OINTMENT | Freq: Two times a day (BID) | OPHTHALMIC | 1 refills | Status: AC
  Filled 2023-08-09: qty 3.5, 7d supply, fill #0
  Filled 2023-08-10: qty 3.5, 7d supply, fill #1

## 2023-08-10 ENCOUNTER — Other Ambulatory Visit: Payer: Self-pay

## 2023-08-14 ENCOUNTER — Other Ambulatory Visit: Payer: Self-pay

## 2023-08-18 NOTE — Progress Notes (Signed)
This encounter was created in error - please disregard.

## 2023-08-20 ENCOUNTER — Other Ambulatory Visit: Payer: Self-pay

## 2023-08-20 ENCOUNTER — Encounter (HOSPITAL_COMMUNITY): Payer: Self-pay | Admitting: *Deleted

## 2023-08-20 ENCOUNTER — Emergency Department (HOSPITAL_COMMUNITY): Payer: 59

## 2023-08-20 ENCOUNTER — Emergency Department (HOSPITAL_COMMUNITY)
Admission: EM | Admit: 2023-08-20 | Discharge: 2023-08-20 | Disposition: A | Discharge status: Home / Self Care | Payer: 59 | Attending: Emergency Medicine | Admitting: Emergency Medicine

## 2023-08-20 DIAGNOSIS — E1165 Type 2 diabetes mellitus with hyperglycemia: Secondary | ICD-10-CM | POA: Diagnosis not present

## 2023-08-20 DIAGNOSIS — S0990XA Unspecified injury of head, initial encounter: Secondary | ICD-10-CM | POA: Diagnosis not present

## 2023-08-20 DIAGNOSIS — Z950 Presence of cardiac pacemaker: Secondary | ICD-10-CM | POA: Insufficient documentation

## 2023-08-20 DIAGNOSIS — Z8673 Personal history of transient ischemic attack (TIA), and cerebral infarction without residual deficits: Secondary | ICD-10-CM | POA: Diagnosis not present

## 2023-08-20 DIAGNOSIS — W182XXA Fall in (into) shower or empty bathtub, initial encounter: Secondary | ICD-10-CM | POA: Diagnosis not present

## 2023-08-20 DIAGNOSIS — I6789 Other cerebrovascular disease: Secondary | ICD-10-CM | POA: Diagnosis not present

## 2023-08-20 DIAGNOSIS — Z1152 Encounter for screening for COVID-19: Secondary | ICD-10-CM | POA: Diagnosis not present

## 2023-08-20 DIAGNOSIS — R0789 Other chest pain: Secondary | ICD-10-CM | POA: Insufficient documentation

## 2023-08-20 DIAGNOSIS — R0602 Shortness of breath: Secondary | ICD-10-CM | POA: Diagnosis not present

## 2023-08-20 DIAGNOSIS — Z794 Long term (current) use of insulin: Secondary | ICD-10-CM | POA: Insufficient documentation

## 2023-08-20 DIAGNOSIS — Z7901 Long term (current) use of anticoagulants: Secondary | ICD-10-CM | POA: Diagnosis not present

## 2023-08-20 DIAGNOSIS — R079 Chest pain, unspecified: Secondary | ICD-10-CM | POA: Diagnosis present

## 2023-08-20 LAB — BASIC METABOLIC PANEL
Anion gap: 11 (ref 5–15)
BUN: 25 mg/dL — ABNORMAL HIGH (ref 6–20)
CO2: 21 mmol/L — ABNORMAL LOW (ref 22–32)
Calcium: 9 mg/dL (ref 8.9–10.3)
Chloride: 102 mmol/L (ref 98–111)
Creatinine, Ser: 1.97 mg/dL — ABNORMAL HIGH (ref 0.61–1.24)
GFR, Estimated: 39 mL/min — ABNORMAL LOW (ref >60)
Glucose, Bld: 424 mg/dL — ABNORMAL HIGH (ref 70–99)
Potassium: 3.5 mmol/L (ref 3.5–5.1)
Sodium: 134 mmol/L — ABNORMAL LOW (ref 135–145)

## 2023-08-20 LAB — CBC
HCT: 40.2 % (ref 39.0–52.0)
Hemoglobin: 12.2 g/dL — ABNORMAL LOW (ref 13.0–17.0)
MCH: 25.1 pg — ABNORMAL LOW (ref 26.0–34.0)
MCHC: 30.3 g/dL (ref 30.0–36.0)
MCV: 82.7 fL (ref 80.0–100.0)
Platelets: 233 10*3/uL (ref 150–400)
RBC: 4.86 MIL/uL (ref 4.22–5.81)
RDW: 14.5 % (ref 11.5–15.5)
WBC: 6.5 10*3/uL (ref 4.0–10.5)
nRBC: 0 % (ref 0.0–0.2)

## 2023-08-20 LAB — URINALYSIS, ROUTINE W REFLEX MICROSCOPIC
Bacteria, UA: NONE SEEN
Bilirubin Urine: NEGATIVE
Glucose, UA: >=500 mg/dL — AB
Hgb urine dipstick: NEGATIVE
Ketones, ur: NEGATIVE mg/dL
Leukocytes,Ua: NEGATIVE
Nitrite: NEGATIVE
Protein, ur: 100 mg/dL — AB
Specific Gravity, Urine: 1.016 (ref 1.005–1.030)
pH: 5 (ref 5.0–8.0)

## 2023-08-20 LAB — RESP PANEL BY RT-PCR (RSV, FLU A&B, COVID)  RVPGX2
Influenza A by PCR: NEGATIVE
Influenza B by PCR: NEGATIVE
Resp Syncytial Virus by PCR: NEGATIVE
SARS Coronavirus 2 by RT PCR: NEGATIVE

## 2023-08-20 LAB — TROPONIN I (HIGH SENSITIVITY)
Troponin I (High Sensitivity): 12 ng/L (ref <18)
Troponin I (High Sensitivity): 11 ng/L (ref <18)

## 2023-08-20 LAB — BRAIN NATRIURETIC PEPTIDE: B Natriuretic Peptide: 15.8 pg/mL (ref 0.0–100.0)

## 2023-08-20 LAB — CBG MONITORING, ED: Glucose-Capillary: 361 mg/dL — ABNORMAL HIGH (ref 70–99)

## 2023-08-20 MED ORDER — INSULIN GLARGINE-YFGN 100 UNIT/ML ~~LOC~~ SOLN
12.0000 [IU] | Freq: Once | SUBCUTANEOUS | Status: AC
  Administered 2023-08-20: 12 [IU] via SUBCUTANEOUS
  Filled 2023-08-20: qty 0.12

## 2023-08-20 NOTE — Discharge Instructions (Addendum)
Your chest and rib xray here do not show any broken ribs or chest injury. Your blood sugar was elevated but this was likely due to missing your morning insulin. Continue your regular medications at home as prescribed.   Please plan to follow up with your doctor as needed if pain is ongoing.

## 2023-08-20 NOTE — ED Provider Notes (Addendum)
[place] EMERGENCY DEPARTMENT AT [facility] Provider Note   CSN: [id number] Arrival date & time: 08/20/23  1209     History  Chief Complaint  Patient presents with   Chest Pain    Jeffrey [name] is a 57 y.o. male.  Patient to ED after fall yesterday, hitting his right chest. He complains of persistent right sided chest pain since fall. He is anticoagulated on Eliquis. He reports hitting his head during the fall as well but does not feel he passed out. He denies other injury. Sister at bedside who reports patient compliant with medications which are managed by significant other at home. No vomiting. He reports he feels SOB because it hurts his right chest when he breaths. No abdominal pain. No neck pain.    The history is provided by the patient and a relative. No language interpreter was used.  Chest Pain      Home Medications Prior to Admission medications   Medication Sig Start Date End Date Taking? Authorizing Provider  Accu-Chek Softclix Lancets lancets Use to check blood sugar three times daily E11.65 08/12/21   Hoy Register, MD  Accu-Chek Softclix Lancets lancets use lancet to check blood glucose once daily 08/19/22     acetaminophen (TYLENOL) 500 MG tablet Take 1,000 mg by mouth every 6 (six) hours as needed for moderate pain.    [provider]  acetaZOLAMIDE ER (DIAMOX) 500 MG capsule Take 1 capsule (500 mg total) by mouth daily. 06/07/23   Carmela Rima, MD  albuterol (VENTOLIN HFA) 108 (90 Base) MCG/ACT inhaler Inhale 1 to 2 puffs by mouth into the lungs every 6 hours as needed for wheezing or shortness of breath. 10/07/21   Anders Simmonds, PA-C  apixaban (ELIQUIS) 5 MG TABS tablet Take 1 tablet (5 mg total) by mouth 2 (two) times daily for stroke prophylaxis 05/02/23     Blood Glucose Monitoring Suppl (ACCU-CHEK GUIDE) w/Device KIT Use to check blood sugar three times daily E11.65 08/12/21   Hoy Register, MD  Blood Glucose Monitoring Suppl  (ACCU-CHEK GUIDE) w/Device KIT use kit to check blod glucose 4-5 daily 08/19/22     brimonidine (ALPHAGAN P) 0.1 % SOLN Place 1 drop into the left eye 2 (two) times daily. Patient not taking: Reported on 04/10/2023 08/18/22   Carmela Rima, MD  brimonidine [facility]) 0.2 % ophthalmic solution Place 1 drop into the right eye 3 (three) times daily.    [provider]  carvedilol (COREG) 12.5 MG tablet Take 12.5 mg by mouth 2 (two) times daily with a meal. 07/11/22   [provider]  cetirizine (ZYRTEC) 10 MG tablet TAKE 1 TABLET (10 MG TOTAL) BY MOUTH DAILY. Patient taking differently: Take 10 mg by mouth daily as needed for allergies. 07/27/20   Hoy Register, MD  Continuous Glucose Receiver (FREESTYLE LIBRE 2 READER) DEVI Use as directed. 03/28/23   Carlus Pavlov, MD  Continuous Glucose Sensor (FREESTYLE LIBRE 2 SENSOR) MISC Change every 14 (fourteen) days. 03/28/23   Carlus Pavlov, MD  Elastic Bandages & Supports (T.E.D. KNEE LENGTH/S-LONG) MISC 1 (one) each daily, apply compression stockings daily to help decrease leg swelling Patient not taking: Reported on 01/04/2023 11/11/21     empagliflozin (JARDIANCE) 10 MG TABS tablet Take 1 tablet (10 mg total) by mouth daily for diabetes 07/13/23     ferrous sulfate 325 (65 FE) MG tablet Take 325 mg by mouth daily with breakfast.    [provider]  furosemide (LASIX)  40 MG tablet Take 1 tablet (40 mg total) by mouth 2 (two) times daily. Patient taking differently: Take 40 mg by mouth daily. 11/22/22     gabapentin (NEURONTIN) 300 MG capsule TAKE 2 CAPSULES BY MOUTH AT BEDTIME Patient taking differently: Take 300 mg by mouth at bedtime. 06/15/23     glucose blood (ACCU-CHEK GUIDE) test strip Use to check blood sugar three times daily E11.65 08/12/21   Hoy Register, MD  glucose blood test strip use strip to check blood glucose 4-5 times daily 08/19/22     hydrALAZINE (APRESOLINE) 50 MG tablet Take 1 tablet (50 mg total) by mouth 3  (three) times daily. 08/03/22   Catarina Hartshorn, MD  Insulin Glargine Solostar (LANTUS) 100 UNIT/ML Solostar Pen inject 12-14 units subcutaneously in the morning 07/27/22     Insulin Pen Needle (B-D UF III MINI PEN NEEDLES) 31G X 5 MM MISC Inject 1 Pen into the skin daily. 09/20/22     Insulin Pen Needle (BD PEN NEEDLE NANO U/F) 32G X 4 MM MISC USE TO INJECT LANTUS DAILY. MUST USE NEW PEN NEEDLE WITH EACH INJECTION. 04/18/22   Carlus Pavlov, MD  isosorbide mononitrate (IMDUR) 30 MG 24 hr tablet Take 30 mg by mouth in the morning. 07/11/22   [provider]  lactulose (CHRONULAC) 10 GM/15ML solution Take 30 mLs (20 g total) by mouth daily. Patient taking differently: Take 20 g by mouth daily as needed (constipation.). 08/19/22     lisinopril (ZESTRIL) 10 MG tablet Take 1 tablet (10 mg total) by mouth daily. Patient not taking: Reported on 12/15/2022 12/01/22     losartan (COZAAR) 50 MG tablet Take 1 tablet (50 mg total) by mouth daily. 03/21/23   Estanislado Emms, MD  magnesium oxide (MAG-OX) 400 (240 Mg) MG tablet Take 400 mg by mouth daily.    [provider]  ofloxacin (OCUFLOX) 0.3 % ophthalmic solution Place 1 drop into both eyes 4 (four) times daily. Patient not taking: Reported on 07/18/2023 12/15/22   Carmela Rima, MD  ofloxacin (OCUFLOX) 0.3 % ophthalmic solution Place 1 drop into the right eye 4 (four) times daily for 7 days. 08/09/23 09/03/23  Carmela Rima, MD  ondansetron (ZOFRAN) 4 MG tablet Take 1 tablet (4 mg total) by mouth every 6 (six) hours as needed for nausea or vomiting. 12/03/19   Dione Booze, MD  potassium chloride (MICRO-K) 10 MEQ CR capsule Take 1 capsule (10 mEq total) by mouth daily. 07/18/23   Estanislado Emms, MD  prednisoLONE acetate (PRED FORTE) 1 % ophthalmic suspension Place 1 drop into the right eye 4 (four) times daily for 7 days, THEN 1 drop 3 (three) times daily for 7 days, THEN 1 drop 2 (two) times daily for 7 days, THEN 1 drop daily for 7 days. 08/09/23 10/06/23   Carmela Rima, MD  rosuvastatin (CRESTOR) 40 MG tablet TAKE 1 TABLET BY MOUTH DAILY 03/02/23     Semaglutide,0.25 or 0.5MG /DOS, 2 MG/3ML SOPN Inject 0.5 mg into the skin once a week. Patient taking differently: Inject 0.5 mg into the skin every Sunday. 07/26/22   Carlus Pavlov, MD  tamsulosin (FLOMAX) 0.4 MG CAPS capsule TAKE 1 CAPSULE BY MOUTH AT BEDTIME (STOP IF DIZZINESS OCCURS) 05/11/23     Vitamin D, Ergocalciferol, (DRISDOL) 1.25 MG (50000 UNIT) CAPS capsule take 1 capsule by mouth once a week on Sunday's x 12 weeks 01/17/23     amLODipine (NORVASC) 10 MG tablet Take 1 tablet (10 mg total) by  mouth daily. 03/17/22 04/27/22  Leroy Sea, MD      Allergies    Patient has no known allergies.    Review of Systems   Review of Systems  Cardiovascular:  Positive for chest pain.    Physical Exam Updated Vital Signs BP (!) 153/93   Pulse (!) 58   Temp 97.8 F (36.6 C) (Oral)   Resp 13   Ht 5\' 7"  (1.702 m)   Wt 73.9 kg   SpO2 99%   BMI 25.53 kg/m  Physical Exam Vitals and nursing note reviewed.  Constitutional:      Appearance: He is well-developed.  HENT:     Head: Atraumatic.  Neck:     Comments: No midline cervical tenderness.  Cardiovascular:     Rate and Rhythm: Normal rate and regular rhythm.     Comments: Pacemaker in right chest. Pulmonary:     Effort: Pulmonary effort is normal.  Chest:     Chest wall: Tenderness present.     Comments: No chest wall bruising.  Abdominal:     General: There is no abdominal bruit.     Palpations: Abdomen is soft.  Musculoskeletal:        General: Normal range of motion.     Cervical back: Normal range of motion.     Comments: Moves all extremities without limitation.  Skin:    General: Skin is warm and dry.  Neurological:     Mental Status: He is alert and oriented to person, place, and time.     ED Results / Procedures / Treatments   Labs (all labs ordered are listed, but only abnormal results are displayed) Labs  Reviewed  BASIC METABOLIC PANEL - Abnormal; Notable for the following components:      Result Value   Sodium 134 (*)    CO2 21 (*)    Glucose, Bld 424 (*)    BUN 25 (*)    Creatinine, Ser 1.97 (*)    GFR, Estimated 39 (*)    All other components within normal limits  CBC - Abnormal; Notable for the following components:   Hemoglobin 12.2 (*)    MCH 25.1 (*)    All other components within normal limits  URINALYSIS, ROUTINE W REFLEX MICROSCOPIC - Abnormal; Notable for the following components:   Color, Urine STRAW (*)    Glucose, UA >=500 (*)    Protein, ur 100 (*)    All other components within normal limits  CBG MONITORING, ED - Abnormal; Notable for the following components:   Glucose-Capillary 361 (*)    All other components within normal limits  RESP PANEL BY RT-PCR (RSV, FLU A&B, COVID)  RVPGX2  BRAIN NATRIURETIC PEPTIDE  TROPONIN I (HIGH SENSITIVITY)  TROPONIN I (HIGH SENSITIVITY)    EKG None  Radiology DG Ribs Unilateral W/Chest Right  Result Date: 08/20/2023 CLINICAL DATA:  Status post fall with rib pain. EXAM: RIGHT RIBS AND CHEST - 3+ VIEW COMPARISON:  August 01, 2022 FINDINGS: No fracture or other bone lesions are seen involving the ribs. There is no evidence of pneumothorax or pleural effusion. Both lungs are clear. Heart size and mediastinal contours are within normal limits. Tortuosity and calcific atherosclerotic disease of the aorta. Cardiac pacemaker and spinal stimulator in stable position. IMPRESSION: Negative. Electronically Signed   By: Ted Mcalpine M.D.   On: 08/20/2023 13:45   CT HEAD WO CONTRAST ( )  Result Date: 08/20/2023 CLINICAL DATA:  Fall. Moderate to severe blunt head  trauma. Patient on anticoagulation. EXAM: CT HEAD WITHOUT CONTRAST TECHNIQUE: Contiguous axial images were obtained from the base of the skull through the vertex without intravenous contrast. RADIATION DOSE REDUCTION: This exam was performed according to the departmental  dose-optimization program which includes automated exposure control, adjustment of the mA and/or kV according to patient size and/or use of iterative reconstruction technique. COMPARISON:  08/01/2022 FINDINGS: Brain: No evidence of intracranial hemorrhage, acute infarction, hydrocephalus, extra-axial collection, or mass lesion/mass effect. Mild diffuse cerebral atrophy and moderate chronic small vessel disease are unchanged in appearance. 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 Vascular:  No hyperdense vessel or other acute findings. Skull: No evidence of fracture or other significant bone abnormality. Sinuses/Orbits:  No acute findings.  Right ocular prosthesis noted. Other: None. IMPRESSION: No acute intracranial abnormality. 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 Electronically Signed   By: Danae Orleans M.D.   On: 08/20/2023 12:58    Procedures Procedures    Medications Ordered in ED Medications  insulin glargine-yfgn (SEMGLEE) injection 12 Units (12 Units Subcutaneous Given 08/20/23 1555)    ED Course/ Medical Decision Making/ A&P Clinical Course as of 08/20/23 1611  Sun Aug 20, 2023  1444 Patient here for evaluation after fall yesterday, c/o right sided chest pain where he fell, headache. Anticoagulated on Eliquis. Ribs and chest xray negative. Head CT negative. He is hyperglycemic at 424, known diabetic. He has not taken his morning insulin. SO contacted by phone to confirm dose which she reports as 12U Lantus. 12U ordered here. Will recheck CBG. 2nd trop pending.  [SU]  1556 Repeat CBG 361. Insulin he missed this morning given in the ED. He is stable for discharge home with down trending of CBG as planned.  [SU]    Clinical Course User Index [SU] Elpidio Anis, PA-C                                 Medical Decision Making Risk Prescription drug  management.           Final Clinical Impression(s) / ED Diagnoses Final diagnoses:  Chest wall pain  Hyperglycemia due to diabetes mellitus [facility])    Rx / DC Orders ED Discharge Orders     None         Elpidio Anis, PA-C 08/20/23 1601    Elpidio Anis, PA-C 08/20/23 1612    Laurence Spates, MD 08/20/23 [phone]

## 2023-08-20 NOTE — ED Provider Triage Note (Addendum)
Emergency Medicine Provider Triage Evaluation Note  Jeffrey [name] , a 57 y.o. male  was evaluated in triage.  Pt complains of right sided chest pain. The pt states that his pain came on last night. He describes sharp right sided chest pain, no radiation, with associated cough and shortness of breath. Unsure if he is on anticoagulation. On chart review, the pt takes Eliquis. He states that he fell in the tub yesterday. Initially stated that he did not hit his head, then states "well I might have."  Review of Systems  Positive: Chest pain, cough, SOB Negative: Nausea, diaphoresis, vomiting  Physical Exam  BP (!) 152/85 (BP Location: Right Arm)   Pulse 68   Temp 98.8 F (37.1 C) (Oral)   Resp 18   Ht 5\' 7"  (1.702 m)   Wt 73.9 kg   SpO2 93%   BMI 25.53 kg/m  Gen:   Awake, no distress   HEENT:  No midline TTP of the cervical spine Resp:  Normal effort  MSK:   Moves extremities without difficulty  Neuro:  Right eye removed, legally blind in left eye, no cranial nerve deficit, 5/5 strength in all our extremities, intact sensation to light touch  Medical Decision Making  Medically screening exam initiated at 12:28 PM.  Appropriate orders placed.  Jeffrey [name] was informed that the remainder of the evaluation will be completed by another provider, this initial triage assessment does not replace that evaluation, and the importance of remaining in the ED until their evaluation is complete.  XR chest and ribs on the right ordered. CT Head ordered. Labs ordered.       [name], MD 08/20/23 1239

## 2023-08-20 NOTE — ED Triage Notes (Signed)
Patient presents to ed via [facility] from home  states she was in the shower yest and fell  hitting his side on the tub, wife wanted patient to come ;yest however he refused. C/o intermit sob. Hx. 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 Patient is alert oriented slow to respond since previous stroke.

## 2023-08-22 ENCOUNTER — Other Ambulatory Visit: Payer: Self-pay

## 2023-08-23 ENCOUNTER — Other Ambulatory Visit: Payer: Self-pay

## 2023-08-23 MED ORDER — LANTUS SOLOSTAR 100 UNIT/ML ~~LOC~~ SOPN
14.0000 [IU] | PEN_INJECTOR | SUBCUTANEOUS | 5 refills | Status: DC
  Filled 2023-08-23: qty 12, 85d supply, fill #0
  Filled 2023-11-10: qty 12, 85d supply, fill #1
  Filled 2024-02-05: qty 12, 85d supply, fill #2
  Filled 2024-05-07 – 2024-05-17 (×2): qty 12, 85d supply, fill #3
  Filled 2024-08-05: qty 12, 85d supply, fill #4

## 2023-08-24 ENCOUNTER — Other Ambulatory Visit: Payer: Self-pay

## 2023-08-24 MED ORDER — LATANOPROST 0.005 % OP SOLN
1.0000 [drp] | Freq: Every day | OPHTHALMIC | 2 refills | Status: AC
  Filled 2023-08-24: qty 2.5, 50d supply, fill #0
  Filled 2023-10-06: qty 2.5, 50d supply, fill #1
  Filled 2023-11-27: qty 2.5, 50d supply, fill #2

## 2023-08-28 ENCOUNTER — Other Ambulatory Visit: Payer: Self-pay

## 2023-09-04 ENCOUNTER — Other Ambulatory Visit: Payer: Self-pay

## 2023-09-06 NOTE — Op Note (Signed)
Jeffrey [name] 08/08/2023  Diagnosis: Neovascular glaucoma, ocular hypertension, and right eye pain  Procedure:  Evisceration right eye Operative Eye:  right eye  Surgeon: Harrold Donath Estimated Blood Loss: minimal Specimens for Pathology:  None Complications: none   Jeffrey [name] was met in the preoperative area where the right eye was verified as the painful eye needing evisceration and a signed and valid consent was on the chart indicating the right eye as the operative eye.  General anesthesia was induced.  Time out confirmed the right eye as the operative eye.  The  patient was prepped and draped in the usual fashion for ocular surgery on the  right eye .  A lid speculum was placed.    A 360 degree conjunctival peritomy was performed with [employer].  Tenons was dissected back using [employer].  A #15 blade was used to make an incision through the corneal limbus into the anterior chamber.  Curved left and rigth corneal scleral scissors were used to completely remove the cornea and associated iris/ciliary body.  The corneal button and iris/ciliary body button was placed in a cup and sent to pathology as a specimen.  The internal sclera was cleaned with a curved spatula and irrigation.  Hemostasis was maintained with cautery.  Copious irrigation in side the scleral shell was performed.  Relaxing scleral incisions were made to accommodate the spherical implant.  A 16mm spherical implant was placed in the scleral shell and closed with interrupted  Vicryl sutures.   Tenon;s and conjunctiva were reapproximated to cover the scleral shell.  A conformer was sutured in place with 5-0 Nylon suture.    The speculum and drapes were removed and the eye was patched with Polymixin/Bacitracin ophthalmic ointment. An eye shield was placed and the patient was transferred alert and conversant with stable vital signs to the post operative recovery area.  The patient tolerated the procedure  well and no complications were noted.  Harrold Donath MD

## 2023-09-14 ENCOUNTER — Other Ambulatory Visit: Payer: Self-pay

## 2023-09-14 MED ORDER — POTASSIUM CHLORIDE CRYS ER 20 MEQ PO TBCR
40.0000 meq | EXTENDED_RELEASE_TABLET | Freq: Every day | ORAL | 3 refills | Status: DC
  Filled 2023-09-14: qty 180, 90d supply, fill #0
  Filled 2023-12-06: qty 180, 90d supply, fill #1

## 2023-09-14 MED ORDER — DOXYCYCLINE HYCLATE 100 MG PO TABS
100.0000 mg | ORAL_TABLET | Freq: Two times a day (BID) | ORAL | 0 refills | Status: DC
  Filled 2023-09-14: qty 14, 7d supply, fill #0

## 2023-09-14 MED ORDER — HYDRALAZINE HCL 50 MG PO TABS
50.0000 mg | ORAL_TABLET | Freq: Three times a day (TID) | ORAL | 3 refills | Status: DC
  Filled 2023-09-14: qty 270, 90d supply, fill #0
  Filled 2023-12-07: qty 270, 90d supply, fill #1
  Filled 2024-03-07 – 2024-03-20 (×2): qty 270, 90d supply, fill #2
  Filled 2024-06-12 – 2024-09-05 (×2): qty 270, 90d supply, fill #3

## 2023-09-15 ENCOUNTER — Other Ambulatory Visit: Payer: Self-pay

## 2023-09-15 MED ORDER — CARVEDILOL 12.5 MG PO TABS
12.5000 mg | ORAL_TABLET | Freq: Two times a day (BID) | ORAL | 3 refills | Status: AC
  Filled 2023-09-15: qty 180, 90d supply, fill #0

## 2023-09-19 ENCOUNTER — Ambulatory Visit: Payer: 59 | Admitting: Internal Medicine

## 2023-09-22 ENCOUNTER — Other Ambulatory Visit: Payer: Self-pay

## 2023-09-26 ENCOUNTER — Other Ambulatory Visit: Payer: Self-pay

## 2023-09-28 ENCOUNTER — Other Ambulatory Visit: Payer: Self-pay

## 2023-09-28 ENCOUNTER — Ambulatory Visit: Payer: 59 | Admitting: Internal Medicine

## 2023-09-28 NOTE — Progress Notes (Deleted)
Patient ID: Jeffrey [name], male   DOB: [birth year], 57 y.o.   MRN: [id number]  HPI: Jeffrey [name] is a 57 y.o.-year-old male, returning for follow-up for DM2, dx in ~2013, insulin-dependent  since dx., uncontrolled, with long-term complications (CAD, diastolic and systolic CHF, CVAs, CKD stage 4, peripheral neuropathy - s/p 2 R toe amputations, DR, recurring hypoglycemia).  He is here with his wife who offers most of the information about his diabetes history, other medical conditions, diet, and activity, along with blood sugars and insulin doses.  Patient is somnolent and dozes off during the appointment.  Last visit with me 6 months ago.  Interim history: No increased urination, blurry vision, nausea, chest pain.  He continues to have disequilibrium and walks supported by his wife. At last visit, patient had stopped regular sodas, which she was drinking every day in the middle of the night, but switched to juice. 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 He was in the emergency room with chest wall pain 08/20/2023. He had very high blood sugars since last visit while in the ED - In 07/2023: 361, and in 08/2023: 424.  Reviewed HbA1c: Lab Results  Component Value Date   HGBA1C 8.5 (A) 03/28/2023   HGBA1C 7.4 (A) 11/02/2022   HGBA1C 6.8 (A) 06/28/2022   HGBA1C 9.7 (H) 03/11/2022   HGBA1C 7.6 (A) 10/20/2021   HGBA1C 14.2 (H) 06/30/2021   HGBA1C 10.7 (A) 01/13/2021   HGBA1C 15.4 (H) 06/13/2020   HGBA1C 9.9 (H) 04/25/2019   HGBA1C 7.2 (A) 10/24/2018   HGBA1C >15.5 (H) 06/22/2018   HGBA1C 13.2 03/21/2018   HGBA1C 13.8 12/20/2017   HGBA1C 11.8 (H) 01/29/2017   HGBA1C 12.4 (H) 09/12/2016   HGBA1C 13.0 (H) 06/18/2016   HGBA1C 13.0 (H) 06/17/2016   HGBA1C 13.7 02/24/2016   HGBA1C 13.4 (H) 09/28/2015   HGBA1C 13.4 (H) 09/25/2015   HGBA1C 13.90 06/04/2015   HGBA1C 9.60 03/04/2015   HGBA1C 12.9 11/12/2014   HGBA1C 9.9 08/26/2014   HGBA1C  >14% 04/21/2014  03/14/2023: HbA1c 8.5% reportedly, in PCPs office  He is on: - Lantus 35 >> 25 >> 20 >> 12 units in a.m. - Ozempic 0.25 >> 0.5 mg weekly in a.m. He was previously on NovoLog 2-6 units per meal.  Pt checks his sugars 2x a day: - am:  96-120 >> 124-130s, 200s - sodas ay night! >> 116, 145 - 2h after b'fast: 134-150 >> n/c - lunch: 130-140 >> n/c >> 147 - 2h after lunch: n/c >> 171 - dinner: 145-160s (sodas) >> n/c >> 195 - 2h after dinner: 170s-180 >> 160s, 240  >> 68, 136, 179-265, 325 - bedtime: n/c >> 135-173  Previously:   Lowest sugar was 47 >> 70 >> 124 >> 68; he has hypoglycemia awareness at 60.  He was admitted with hypoglycemia-induced encephalopathy (blood sugar 40) 03/10/2022.  At that time he was unresponsive He was admitted with hypoglycemia (blood sugar 47) 03/31/2022. Highest sugar was 300 >> 200 >> 240 >> 300s.  Glucometer: Accu-Chek guide  Pt's meals are: - Breakfast: grits, scrambled eggs and bacon or cereal or oatmeal >> fruit, croissant + cheese and egg - Lunch: PB and jelly; sandwich; soup - Dinner: same; rotisserie chicken - Snacks: seldom banana as a dessert  - + stage 4 CKD - sees Nephrology, last BUN/creatinine:  Lab Results  Component Value Date   BUN 25 (H) 08/20/2023   BUN 35 (H) 08/01/2023  CREATININE 1.97 (H) 08/20/2023   CREATININE 2.53 (H) 08/01/2023   Lab Results  Component Value Date   MICRALBCREAT 2,914 (H) 01/13/2021   MICRALBCREAT 995 (H) 04/25/2019   MICRALBCREAT 1,951.9 (H) 12/20/2017   MICRALBCREAT 397 (H) 10/07/2015  He is on lisinopril 10 mg daily.  - + HL; last set of lipids: 03/14/2023 - by PCP: need records Lab Results  Component Value Date   CHOL 201 (H) 07/02/2021   HDL 36 (L) 07/02/2021   LDLCALC 120 (H) 07/02/2021   TRIG 225 (H) 07/02/2021   CHOLHDL 5.6 07/02/2021  On Crestor 40 mg daily.  - last eye exam was in 06/2022. + DR. Dr. Fabian Sharp. 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 Surgeon: Dr. Allena Katz. Has cataract.  Had surgery OS as he has been given scar tissue buildup in this eye, also.  - no numbness and tingling in his feet.  Last foot exam: 11/24/2022 by Dr. Al Corpus.  He is on ASA 81.  Pt has FH of DM in M, F, 1 B, 3 S's.  He is on iron po.  No personal history of pancreatitis or family history of medullary thyroid cancer or multiple endocrine neoplasia syndromes.  ROS: + See HPI  Past Medical History:  Diagnosis Date   AICD (automatic cardioverter/defibrillator) present    [employer]   AICD (automatic cardioverter/defibrillator) present    MDT Visia AF MRI   Anemia    CAD (coronary artery disease)    a. cath 01/31/17: 60% 1st RPLB, 60% dist RCA, 55% prox RCA, 10% pro LAD --> Rx TX.    CHF (congestive heart failure) (HCC)    Chronic systolic CHF (congestive heart failure) (HCC) 01/28/2017   1. Echo 01/29/17:  EF 20-25, normal wall motion, mild LAE // 2. EF 10-15 by [facility] 01/2017    Diabetes mellitus without complication (HCC)    type 2   Diabetic foot infection (HCC) 03/2016   RT FOOT   Dyspnea    History of kidney stones    passed   HTN (hypertension)    Hyperlipidemia    Hypertension    Myocardial infarction [facility]), although reported, NICM at cath    NICM (nonischemic cardiomyopathy) (HCC) 02/15/2017   1. Mod non-obs CAD on LHC in 01/2017 - CAD does not explain cardiomyopathy   Renal disorder    stage 4   Sleep apnea, unspecified 07/21/2023   Past Surgical History:  Procedure Laterality Date   AIR/FLUID EXCHANGE Right 07/14/2020   Procedure: AIR/FLUID EXCHANGE;  Surgeon: Carmela Rima, MD;  Location: [facility] OR;  Service: Ophthalmology;  Laterality: Right;   AIR/FLUID EXCHANGE Right 04/11/2023   Procedure: AIR/FLUID EXCHANGE;  Surgeon: Carmela Rima, MD;  Location: [facility] OR;  Service: Ophthalmology;  Laterality: Right;   AMPUTATION Right 04/01/2016   Procedure: Right Great Toe Amputation;  Surgeon: Nadara Mustard, MD;  Location: [facility] OR;  Service: Orthopedics;  Laterality: Right;    AMPUTATION Right 06/19/2016   Procedure: AMPUTATION SECOND TOE;  Surgeon: Eldred Manges, MD;  Location: [facility] OR;  Service: Orthopedics;  Laterality: Right;   BACK SURGERY     for abscess   CARDIAC CATHETERIZATION     EVISCERATION Right 08/08/2023   Procedure: EVISCERATION WITH IMPLANTATION OF INTRAOCCULAR IMPLANT;  Surgeon: Carmela Rima, MD;  Location: [facility] OR;  Service: Ophthalmology;  Laterality: Right;   EYE SURGERY     ICD IMPLANT N/A 01/15/2018   Procedure: ICD IMPLANT;  Surgeon: Duke Salvia, MD;  Location: [facility] INVASIVE CV LAB;  Service: Cardiovascular;  Laterality: N/A;   INJECTION OF SILICONE OIL Right 07/14/2020   Procedure: INJECTION OF SILICONE OIL;  Surgeon: Carmela Rima, MD;  Location: [facility] OR;  Service: Ophthalmology;  Laterality: Right;   INJECTION OF SILICONE OIL Right 08/25/2020   Procedure: INJECTION OF SILICONE OIL;  Surgeon: Carmela Rima, MD;  Location: [facility] OR;  Service: Ophthalmology;  Laterality: Right;   INJECTION OF SILICONE OIL Left 07/05/2022   Procedure: INJECTION OF SILICONE OIL;  Surgeon: Carmela Rima, MD;  Location: [facility] OR;  Service: Ophthalmology;  Laterality: Left;   INSERT / REPLACE / REMOVE PACEMAKER     MEMBRANE PEEL Right 08/25/2020   Procedure: MEMBRANE PEEL;  Surgeon: Carmela Rima, MD;  Location: [facility] OR;  Service: Ophthalmology;  Laterality: Right;   MEMBRANE PEEL Left 07/05/2022   Procedure: MEMBRANECTOMY;  Surgeon: Carmela Rima, MD;  Location: [facility] OR;  Service: Ophthalmology;  Laterality: Left;   PARS PLANA VITRECTOMY Right 07/14/2020   Procedure: PARS PLANA VITRECTOMY WITH 25 GAUGE, Membranetomy, drainage of subretinal fluid;  Surgeon: Carmela Rima, MD;  Location: [facility] OR;  Service: Ophthalmology;  Laterality: Right;   PARS PLANA VITRECTOMY Right 08/25/2020   Procedure: PARS PLANA VITRECTOMY WITH 25 GAUGE;  Surgeon: Carmela Rima, MD;  Location: [facility] OR;  Service: Ophthalmology;  Laterality: Right;   PARS PLANA VITRECTOMY Left 07/05/2022    Procedure: LEFT PARS PLANA VITRECTOMY WITH 25 GAUGE;  Surgeon: Carmela Rima, MD;  Location: [facility] OR;  Service: Ophthalmology;  Laterality: Left;   PARS PLANA VITRECTOMY Left 10/25/2022   Procedure: PARS PLANA VITRECTOMY WITH 25 GAUGE;  Surgeon: Carmela Rima, MD;  Location: [facility] OR;  Service: Ophthalmology;  Laterality: Left;   PARS PLANA VITRECTOMY Right 04/11/2023   Procedure: PARS PLANA VITRECTOMY WITH 25 GAUGE;  Surgeon: Carmela Rima, MD;  Location: [facility] OR;  Service: Ophthalmology;  Laterality: Right;   PHOTOCOAGULATION WITH LASER Right 07/14/2020   Procedure: PHOTOCOAGULATION WITH LASER;  Surgeon: Carmela Rima, MD;  Location: [facility] OR;  Service: Ophthalmology;  Laterality: Right;   PHOTOCOAGULATION WITH LASER Right 08/25/2020   Procedure: PHOTOCOAGULATION WITH LASER;  Surgeon: Carmela Rima, MD;  Location: [facility] OR;  Service: Ophthalmology;  Laterality: Right;   PHOTOCOAGULATION WITH LASER Left 07/05/2022   Procedure: PHOTOCOAGULATION WITH LASER;  Surgeon: Carmela Rima, MD;  Location: [facility] OR;  Service: Ophthalmology;  Laterality: Left;   PHOTOCOAGULATION WITH LASER Left 10/25/2022   Procedure: PHOTOCOAGULATION WITH LASER;  Surgeon: Carmela Rima, MD;  Location: [facility] OR;  Service: Ophthalmology;  Laterality: Left;   PHOTOCOAGULATION WITH LASER Right 04/11/2023   Procedure: PHOTOCOAGULATION WITH LASER;  Surgeon: Carmela Rima, MD;  Location: [facility] OR;  Service: Ophthalmology;  Laterality: Right;   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   Procedure: REPAIR OF HEMORRHAGIC DETACHMENT;  Surgeon: Carmela Rima, MD;  Location: [facility] OR;  Service: Ophthalmology;  Laterality: Right;   RIGHT/LEFT HEART CATH AND CORONARY ANGIOGRAPHY N/A 01/31/2017   Procedure: Right/Left Heart Cath and Coronary Angiography;  Surgeon: Lennette Bihari, MD;  Location: [facility] INVASIVE CV LAB;  Service: Cardiovascular;  Laterality: N/A;   SILICON OIL REMOVAL Right 08/25/2020   Procedure: SILICON OIL  REMOVAL;  Surgeon: Carmela Rima, MD;  Location: [facility] OR;  Service: Ophthalmology;  Laterality: Right;   SILICON OIL REMOVAL Left 10/25/2022   Procedure: SILICON OIL REMOVAL;  Surgeon: Carmela Rima, MD;  Location: [facility] OR;  Service: Ophthalmology;  Laterality: Left;   SILICON OIL REMOVAL Right 04/11/2023   Procedure: SILICON OIL REMOVAL;  Surgeon: Allena Katz,  Heide Spark, MD;  Location: [facility] OR;  Service: Ophthalmology;  Laterality: Right;   Social History   Socioeconomic History   Marital status: Significant Other    Spouse name: Significant other Britta Mccreedy   Number of children: 1   Years of education: Not on file   Highest education level: Not on file  Occupational History   Occupation: Disabled  Tobacco Use   Smoking status: Never   Smokeless tobacco: Never  Vaping Use   Vaping status: Never Used  Substance and Sexual Activity   Alcohol use: Never   Drug use: Never   Sexual activity: Not Currently  Other Topics Concern   Not on file  Social History Narrative   ** Merged History Encounter **       Social Determinants of Health   Financial Resource Strain: Not on file  Food Insecurity: Not on file  Transportation Needs: Not on file  Physical Activity: Not on file  Stress: Not on file  Social Connections: Not on file  Intimate Partner Violence: Not on file   Current Outpatient Medications on File Prior to Visit  Medication Sig Dispense Refill   Accu-Chek Softclix Lancets lancets Use to check blood sugar three times daily E11.65 100 each 5   Accu-Chek Softclix Lancets lancets use lancet to check blood glucose once daily 100 each 3   acetaminophen (TYLENOL) 500 MG tablet Take 1,000 mg by mouth every 6 (six) hours as needed for moderate pain.     acetaZOLAMIDE ER (DIAMOX) 500 MG capsule Take 1 capsule (500 mg total) by mouth daily. 60 capsule 1   albuterol (VENTOLIN HFA) 108 (90 Base) MCG/ACT inhaler Inhale 1 to 2 puffs by mouth into the lungs every 6 hours as needed for wheezing or  shortness of breath. 18 g 0   apixaban (ELIQUIS) 5 MG TABS tablet Take 1 tablet (5 mg total) by mouth 2 (two) times daily for stroke prophylaxis 180 tablet 3   Blood Glucose Monitoring Suppl (ACCU-CHEK GUIDE) w/Device KIT Use to check blood sugar three times daily E11.65 1 kit 0   Blood Glucose Monitoring Suppl (ACCU-CHEK GUIDE) w/Device KIT use kit to check blod glucose 4-5 daily 1 kit 0   brimonidine (ALPHAGAN P) 0.1 % SOLN Place 1 drop into the left eye 2 (two) times daily. (Patient not taking: Reported on 04/10/2023) 5 mL 2   brimonidine (ALPHAGAN) 0.2 % ophthalmic solution Place 1 drop into the right eye 3 (three) times daily.     carvedilol (COREG) 12.5 MG tablet Take 12.5 mg by mouth 2 (two) times daily with a meal.     carvedilol (COREG) 12.5 MG tablet Take 1 tablet (12.5 mg total) by mouth 2 (two) times daily with a meal. 180 tablet 3   cetirizine (ZYRTEC) 10 MG tablet TAKE 1 TABLET (10 MG TOTAL) BY MOUTH DAILY. (Patient taking differently: Take 10 mg by mouth daily as needed for allergies.) 30 tablet 0   Continuous Glucose Receiver (FREESTYLE LIBRE 2 READER) DEVI Use as directed. 1 each 3   Continuous Glucose Sensor (FREESTYLE LIBRE 2 SENSOR) MISC Change every 14 (fourteen) days. 6 each 3   Elastic Bandages & Supports (T.E.D. KNEE LENGTH/S-LONG) MISC 1 (one) each daily, apply compression stockings daily to help decrease leg swelling (Patient not taking: Reported on 01/04/2023) 1 each 0   empagliflozin (JARDIANCE) 10 MG TABS tablet Take 1 tablet (10 mg total) by mouth daily for diabetes 90 tablet 3   ferrous sulfate 325 (65 FE) MG  tablet Take 325 mg by mouth daily with breakfast.     furosemide (LASIX) 40 MG tablet Take 1 tablet (40 mg total) by mouth 2 (two) times daily. (Patient taking differently: Take 40 mg by mouth daily.) 180 tablet 11   gabapentin (NEURONTIN) 300 MG capsule TAKE 2 CAPSULES BY MOUTH AT BEDTIME (Patient taking differently: Take 300 mg by mouth at bedtime.) 180 capsule 2    glucose blood (ACCU-CHEK GUIDE) test strip Use to check blood sugar three times daily E11.65 100 each 12   glucose blood test strip use strip to check blood glucose 4-5 times daily 300 strip 3   hydrALAZINE (APRESOLINE) 50 MG tablet Take 1 tablet (50 mg total) by mouth 3 (three) times daily. 270 tablet 3   insulin glargine (LANTUS SOLOSTAR) 100 UNIT/ML Solostar Pen Inject 12-14 units subcutaneously in the morning. 30 mL 5   Insulin Pen Needle (B-D UF III MINI PEN NEEDLES) 31G X 5 MM MISC Inject 1 Pen into the skin daily. 100 each 3   Insulin Pen Needle (BD PEN NEEDLE NANO U/F) 32G X 4 MM MISC USE TO INJECT LANTUS DAILY. MUST USE NEW PEN NEEDLE WITH EACH INJECTION. 100 each 3   isosorbide mononitrate (IMDUR) 30 MG 24 hr tablet Take 30 mg by mouth in the morning.     lactulose (CHRONULAC) 10 GM/15ML solution Take 30 mLs (20 g total) by mouth daily. (Patient taking differently: Take 20 g by mouth daily as needed (constipation.).) 1800 mL 3   latanoprost (XALATAN) 0.005 % ophthalmic solution Place 1 drop into the left eye at bedtime. 2.5 mL 2   lisinopril (ZESTRIL) 10 MG tablet Take 1 tablet (10 mg total) by mouth daily. (Patient not taking: Reported on 12/15/2022) 90 tablet 2   losartan (COZAAR) 50 MG tablet Take 1 tablet (50 mg total) by mouth daily. 90 tablet 3   magnesium oxide (MAG-OX) 400 (240 Mg) MG tablet Take 400 mg by mouth daily.     ofloxacin (OCUFLOX) 0.3 % ophthalmic solution Place 1 drop into both eyes 4 (four) times daily. (Patient not taking: Reported on 07/18/2023) 5 mL 0   ondansetron (ZOFRAN) 4 MG tablet Take 1 tablet (4 mg total) by mouth every 6 (six) hours as needed for nausea or vomiting. 12 tablet 0   potassium chloride (MICRO-K) 10 MEQ CR capsule Take 1 capsule (10 mEq total) by mouth daily. 90 capsule 3   potassium chloride SA (KLOR-CON M) 20 MEQ tablet Take 2 tablets (40 mEq total) by mouth daily. 180 tablet 3   prednisoLONE acetate (PRED FORTE) 1 % ophthalmic suspension Place 1  drop into the right eye 4 (four) times daily for 7 days, THEN 1 drop 3 (three) times daily for 7 days, THEN 1 drop 2 (two) times daily for 7 days, THEN 1 drop daily for 7 days. 10 mL 1   rosuvastatin (CRESTOR) 40 MG tablet TAKE 1 TABLET BY MOUTH DAILY 90 tablet 3   Semaglutide,0.25 or 0.5MG /DOS, 2 MG/3ML SOPN Inject 0.5 mg into the skin once a week. (Patient taking differently: Inject 0.5 mg into the skin every Sunday.) 3 mL 3   tamsulosin (FLOMAX) 0.4 MG CAPS capsule TAKE 1 CAPSULE BY MOUTH AT BEDTIME (STOP IF DIZZINESS OCCURS) 90 capsule 2   [DISCONTINUED] amLODipine (NORVASC) 10 MG tablet Take 1 tablet (10 mg total) by mouth daily. 30 tablet 0   No current facility-administered medications on file prior to visit.   No Known Allergies Family History  Problem Relation Age of Onset   Diabetes Mother    Hypertension Mother    Diabetes Father    Heart attack Father    Diabetes Sister    Heart attack Maternal Grandmother    Colon cancer Neg Hx    Colon polyps Neg Hx    Esophageal cancer Neg Hx    Rectal cancer Neg Hx    Stomach cancer Neg Hx    PE: There were no vitals taken for this visit. Wt Readings from Last 3 Encounters:  08/20/23 163 lb (73.9 kg)  08/08/23 163 lb 12.8 oz (74.3 kg)  08/01/23 163 lb 12.8 oz (74.3 kg)   Constitutional: overweight, in NAD; had difficulty rising from the chair, had to be helped Eyes: no exophthalmos ENT: no thyromegaly, no cervical lymphadenopathy Cardiovascular: RRR, No MRG Respiratory: CTA B Musculoskeletal: no deformities except first 2 right toes amputated Skin: no rashes Neurological: no tremor with outstretched hands  ASSESSMENT: 1. DM2, insulin-dependent, uncontrolled, with complications - CAD - diastolic and systolic CHF - CVAs - CKD stage 3 - DR - peripheral neuropathy, s/p amputations 1st and 2nd R toes - recurring hypoglycemia -admitted with hypoglycemia induced encephalopathy on 03/10/2022 - glu of 40 and was unresponsive.   Admitted again with hypoglycemia at 46 on 03/31/2022. - recurring hyperglycemia  2. HL  PLAN:  1. Patient with longstanding type 2 diabetes, previously basal left bolus insulin regimen, with recurrent episodes of hypoglycemia.  He was able to stop mealtime insulin when starting Ozempic.  At last visit, he was still drinking sweet drinks (juice, after he stopped drinking regular sodas).  I strongly advised him to stop any sweet drinks as he had spikes in the 200s and 300s after juice.  I also recommended to restart the CGM, as this was off at last visit.  He prefered to have a CGM receiver, since he did not have a smart phone.  We did not change his regimen otherwise.  - I suggested to:  Patient Instructions  Please continue: - Lantus 12-14 units in a.m. - Ozempic 0.5 mg weekly in a.m.  NO SWEET DRINK! NO SODAS, JUICE, SWEET TEA!  Please return in 3 months.  - we checked his HbA1c: 7%  - advised to check sugars at different times of the day - 4x a day, rotating check times - advised for yearly eye exams >> he is UTD - foot exam checked 10/2022: poor circulation and poor sensation.  He has a history of transmetatarsal amputation -I recommended to establish care at [facility]. He saw Dr. Al Corpus in 11/2022. - return to clinic in 3-4 months  2. HL -Reviewed latest lipid panel all fractions abnormal Lab Results  Component Value Date   CHOL 201 (H) 07/02/2021   HDL 36 (L) 07/02/2021   LDLCALC 120 (H) 07/02/2021   TRIG 225 (H) 07/02/2021   CHOLHDL 5.6 07/02/2021  -He continues on Crestor 40 mg daily without side effects -He had another lipid panel 2 weeks prior to our last visit - I advised him to bring me the results.  Carlus Pavlov, MD PhD [facility] Endocrinology

## 2023-09-29 ENCOUNTER — Other Ambulatory Visit: Payer: Self-pay

## 2023-10-02 ENCOUNTER — Ambulatory Visit: Payer: 59 | Admitting: Internal Medicine

## 2023-10-06 ENCOUNTER — Other Ambulatory Visit: Payer: Self-pay

## 2023-10-06 ENCOUNTER — Ambulatory Visit: Payer: 59 | Admitting: Internal Medicine

## 2023-10-06 ENCOUNTER — Encounter: Payer: Self-pay | Admitting: Internal Medicine

## 2023-10-06 NOTE — Progress Notes (Deleted)
Patient ID: MARVEL FINNEGAN, male   DOB: 1966-03-12, 57 y.o.   MRN: [id number]  HPI: KAYLEE TODOROVICH is a 57 y.o.-year-old male, returning for follow-up for DM2, dx in ~2013, insulin-dependent  since dx., uncontrolled, with long-term complications (CAD, diastolic and systolic CHF, CVAs, CKD stage 4, peripheral neuropathy - s/p 2 R toe amputations, DR, recurring hypoglycemia).  He is here with his wife who offers most of the information about his diabetes history, other medical conditions, diet, and activity, along with blood sugars and insulin doses.  Patient is somnolent and dozes off during the appointment.  Last visit with me 6 months ago.  Interim history: No increased urination, blurry vision, nausea, chest pain.  He continues to have disequilibrium and walks supported by his wife. At last visit, patient had stopped regular sodas, which she was drinking every day in the middle of the night, but switched to juice. 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 He was in the emergency room with chest wall pain 08/20/2023. He had very high blood sugars since last visit while in the ED - In 07/2023: 361, and in 08/2023: 424.  Reviewed HbA1c: Lab Results  Component Value Date   HGBA1C 8.5 (A) 03/28/2023   HGBA1C 7.4 (A) 11/02/2022   HGBA1C 6.8 (A) 06/28/2022   HGBA1C 9.7 (H) 03/11/2022   HGBA1C 7.6 (A) 10/20/2021   HGBA1C 14.2 (H) 06/30/2021   HGBA1C 10.7 (A) 01/13/2021   HGBA1C 15.4 (H) 06/13/2020   HGBA1C 9.9 (H) 04/25/2019   HGBA1C 7.2 (A) 10/24/2018   HGBA1C >15.5 (H) 06/22/2018   HGBA1C 13.2 03/21/2018   HGBA1C 13.8 12/20/2017   HGBA1C 11.8 (H) 01/29/2017   HGBA1C 12.4 (H) 09/12/2016   HGBA1C 13.0 (H) 06/18/2016   HGBA1C 13.0 (H) 06/17/2016   HGBA1C 13.7 02/24/2016   HGBA1C 13.4 (H) 09/28/2015   HGBA1C 13.4 (H) 09/25/2015   HGBA1C 13.90 06/04/2015   HGBA1C 9.60 03/04/2015   HGBA1C 12.9 11/12/2014   HGBA1C 9.9 08/26/2014   HGBA1C  >14% 04/21/2014  03/14/2023: HbA1c 8.5% reportedly, in PCPs office  He is on: - Lantus 35 >> 25 >> 20 >> 12 units in a.m. - Ozempic 0.25 >> 0.5 mg weekly in a.m. He was previously on NovoLog 2-6 units per meal.  Pt checks his sugars 2x a day: - am:  96-120 >> 124-130s, 200s - sodas ay night! >> 116, 145 - 2h after b'fast: 134-150 >> n/c - lunch: 130-140 >> n/c >> 147 - 2h after lunch: n/c >> 171 - dinner: 145-160s (sodas) >> n/c >> 195 - 2h after dinner: 170s-180 >> 160s, 240  >> 68, 136, 179-265, 325 - bedtime: n/c >> 135-173  Previously:   Lowest sugar was 47 >> 70 >> 124 >> 68; he has hypoglycemia awareness at 60.  He was admitted with hypoglycemia-induced encephalopathy (blood sugar 40) 03/10/2022.  At that time he was unresponsive He was admitted with hypoglycemia (blood sugar 47) 03/31/2022. Highest sugar was 240 >> 300s.  Glucometer: Accu-Chek guide  Pt's meals are: - Breakfast: grits, scrambled eggs and bacon or cereal or oatmeal >> fruit, croissant + cheese and egg - Lunch: PB and jelly; sandwich; soup - Dinner: same; rotisserie chicken - Snacks: seldom banana as a dessert  - + stage 4 CKD - sees Nephrology, last BUN/creatinine:  Lab Results  Component Value Date   BUN 25 (H) 08/20/2023   BUN 35 (H) 08/01/2023   CREATININE 1.97 (H)  08/20/2023   CREATININE 2.53 (H) 08/01/2023   Lab Results  Component Value Date   MICRALBCREAT 2,914 (H) 01/13/2021   MICRALBCREAT 995 (H) 04/25/2019   MICRALBCREAT 1,951.9 (H) 12/20/2017   MICRALBCREAT 397 (H) 10/07/2015  He is on lisinopril 10 mg daily.  - + HL; last set of lipids: 03/14/2023 - by PCP: need records Lab Results  Component Value Date   CHOL 201 (H) 07/02/2021   HDL 36 (L) 07/02/2021   LDLCALC 120 (H) 07/02/2021   TRIG 225 (H) 07/02/2021   CHOLHDL 5.6 07/02/2021  On Crestor 40 mg daily.  - last eye exam was in 06/2022. + DR. Dr. Fabian Sharp.  He has cataracts.  Had surgery OS as he had scar tissue buildup  in the side just like in OD.  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  - no numbness and tingling in his feet.  Last foot exam: 11/24/2022 by Dr. Al Corpus.  He recently missed an appointment with Dr. Eloy End.  He is on ASA 81.  Pt has FH of DM in M, F, 1 B, 3 S's.  He is on iron po.  No personal history of pancreatitis or family history of medullary thyroid cancer or multiple endocrine neoplasia syndromes.  ROS: + See HPI  Past Medical History:  Diagnosis Date   AICD (automatic cardioverter/defibrillator) present    [employer]tronic   AICD (automatic cardioverter/defibrillator) present    MDT [employer] AF MRI   Anemia    CAD (coronary artery disease)    a. cath 01/31/17: 60% 1st RPLB, 60% dist RCA, 55% prox RCA, 10% pro LAD --> Rx TX.    CHF (congestive heart failure) (HCC)    Chronic systolic CHF (congestive heart failure) (HCC) 01/28/2017   1. Echo 01/29/17:  EF 20-25, normal wall motion, mild LAE // 2. EF 10-15 by [facility] 01/2017    Diabetes mellitus without complication (HCC)    type 2   Diabetic foot infection (HCC) 03/2016   RT FOOT   Dyspnea    History of kidney stones    passed   HTN (hypertension)    Hyperlipidemia    Hypertension    Myocardial infarction [facility]), although reported, NICM at cath    NICM (nonischemic cardiomyopathy) (HCC) 02/15/2017   1. Mod non-obs CAD on LHC in 01/2017 - CAD does not explain cardiomyopathy   Renal disorder    stage 4   Sleep apnea, unspecified 07/21/2023   Past Surgical History:  Procedure Laterality Date   AIR/FLUID EXCHANGE Right 07/14/2020   Procedure: AIR/FLUID EXCHANGE;  Surgeon: Carmela Rima, MD;  Location: [facility] OR;  Service: Ophthalmology;  Laterality: Right;   AIR/FLUID EXCHANGE Right 04/11/2023   Procedure: AIR/FLUID EXCHANGE;  Surgeon: Carmela Rima, MD;  Location: [facility] OR;  Service: Ophthalmology;  Laterality: Right;   AMPUTATION Right 04/01/2016   Procedure: Right Great Toe Amputation;   Surgeon: Nadara Mustard, MD;  Location: [facility] OR;  Service: Orthopedics;  Laterality: Right;   AMPUTATION Right 06/19/2016   Procedure: AMPUTATION SECOND TOE;  Surgeon: Eldred Manges, MD;  Location: [facility] OR;  Service: Orthopedics;  Laterality: Right;   BACK SURGERY     for abscess   CARDIAC CATHETERIZATION     EVISCERATION Right 08/08/2023   Procedure: EVISCERATION WITH IMPLANTATION OF INTRAOCCULAR IMPLANT;  Surgeon: Carmela Rima, MD;  Location: [facility] OR;  Service: Ophthalmology;  Laterality: Right;   EYE SURGERY     ICD IMPLANT N/A 01/15/2018  Procedure: ICD IMPLANT;  Surgeon: Duke Salvia, MD;  Location: [facility] INVASIVE CV LAB;  Service: Cardiovascular;  Laterality: N/A;   INJECTION OF SILICONE OIL Right 07/14/2020   Procedure: INJECTION OF SILICONE OIL;  Surgeon: Carmela Rima, MD;  Location: [facility] OR;  Service: Ophthalmology;  Laterality: Right;   INJECTION OF SILICONE OIL Right 08/25/2020   Procedure: INJECTION OF SILICONE OIL;  Surgeon: Carmela Rima, MD;  Location: [facility] OR;  Service: Ophthalmology;  Laterality: Right;   INJECTION OF SILICONE OIL Left 07/05/2022   Procedure: INJECTION OF SILICONE OIL;  Surgeon: Carmela Rima, MD;  Location: [facility] OR;  Service: Ophthalmology;  Laterality: Left;   INSERT / REPLACE / REMOVE PACEMAKER     MEMBRANE PEEL Right 08/25/2020   Procedure: MEMBRANE PEEL;  Surgeon: Carmela Rima, MD;  Location: [facility] OR;  Service: Ophthalmology;  Laterality: Right;   MEMBRANE PEEL Left 07/05/2022   Procedure: MEMBRANECTOMY;  Surgeon: Carmela Rima, MD;  Location: [facility] OR;  Service: Ophthalmology;  Laterality: Left;   PARS PLANA VITRECTOMY Right 07/14/2020   Procedure: PARS PLANA VITRECTOMY WITH 25 GAUGE, Membranetomy, drainage of subretinal fluid;  Surgeon: Carmela Rima, MD;  Location: [facility] OR;  Service: Ophthalmology;  Laterality: Right;   PARS PLANA VITRECTOMY Right 08/25/2020   Procedure: PARS PLANA VITRECTOMY WITH 25 GAUGE;  Surgeon: Carmela Rima, MD;  Location: [facility]  OR;  Service: Ophthalmology;  Laterality: Right;   PARS PLANA VITRECTOMY Left 07/05/2022   Procedure: LEFT PARS PLANA VITRECTOMY WITH 25 GAUGE;  Surgeon: Carmela Rima, MD;  Location: [facility] OR;  Service: Ophthalmology;  Laterality: Left;   PARS PLANA VITRECTOMY Left 10/25/2022   Procedure: PARS PLANA VITRECTOMY WITH 25 GAUGE;  Surgeon: Carmela Rima, MD;  Location: [facility] OR;  Service: Ophthalmology;  Laterality: Left;   PARS PLANA VITRECTOMY Right 04/11/2023   Procedure: PARS PLANA VITRECTOMY WITH 25 GAUGE;  Surgeon: Carmela Rima, MD;  Location: [facility] OR;  Service: Ophthalmology;  Laterality: Right;   PHOTOCOAGULATION WITH LASER Right 07/14/2020   Procedure: PHOTOCOAGULATION WITH LASER;  Surgeon: Carmela Rima, MD;  Location: [facility] OR;  Service: Ophthalmology;  Laterality: Right;   PHOTOCOAGULATION WITH LASER Right 08/25/2020   Procedure: PHOTOCOAGULATION WITH LASER;  Surgeon: Carmela Rima, MD;  Location: [facility] OR;  Service: Ophthalmology;  Laterality: Right;   PHOTOCOAGULATION WITH LASER Left 07/05/2022   Procedure: PHOTOCOAGULATION WITH LASER;  Surgeon: Carmela Rima, MD;  Location: [facility] OR;  Service: Ophthalmology;  Laterality: Left;   PHOTOCOAGULATION WITH LASER Left 10/25/2022   Procedure: PHOTOCOAGULATION WITH LASER;  Surgeon: Carmela Rima, MD;  Location: [facility] OR;  Service: Ophthalmology;  Laterality: Left;   PHOTOCOAGULATION WITH LASER Right 04/11/2023   Procedure: PHOTOCOAGULATION WITH LASER;  Surgeon: Carmela Rima, MD;  Location: [facility] OR;  Service: Ophthalmology;  Laterality: Right;   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   Procedure: REPAIR OF HEMORRHAGIC DETACHMENT;  Surgeon: Carmela Rima, MD;  Location: [facility] OR;  Service: Ophthalmology;  Laterality: Right;   RIGHT/LEFT HEART CATH AND CORONARY ANGIOGRAPHY N/A 01/31/2017   Procedure: Right/Left Heart Cath and Coronary Angiography;  Surgeon: Lennette Bihari, MD;  Location: [facility] INVASIVE CV LAB;  Service:  Cardiovascular;  Laterality: N/A;   SILICON OIL REMOVAL Right 08/25/2020   Procedure: SILICON OIL REMOVAL;  Surgeon: Carmela Rima, MD;  Location: [facility] OR;  Service: Ophthalmology;  Laterality: Right;   SILICON OIL REMOVAL Left 10/25/2022   Procedure: SILICON OIL REMOVAL;  Surgeon: Carmela Rima, MD;  Location: [facility] OR;  Service: Ophthalmology;  Laterality: Left;  SILICON OIL REMOVAL Right 04/11/2023   Procedure: SILICON OIL REMOVAL;  Surgeon: Carmela Rima, MD;  Location: [facility] OR;  Service: Ophthalmology;  Laterality: Right;   Social History   Socioeconomic History   Marital status: Significant Other    Spouse name: Significant other Britta Mccreedy   Number of children: 1   Years of education: Not on file   Highest education level: Not on file  Occupational History   Occupation: Disabled  Tobacco Use   Smoking status: Never   Smokeless tobacco: Never  Vaping Use   Vaping status: Never Used  Substance and Sexual Activity   Alcohol use: Never   Drug use: Never   Sexual activity: Not Currently  Other Topics Concern   Not on file  Social History Narrative   ** Merged History Encounter **       Social Determinants of Health   Financial Resource Strain: Not on file  Food Insecurity: Not on file  Transportation Needs: Not on file  Physical Activity: Not on file  Stress: Not on file  Social Connections: Not on file  Intimate Partner Violence: Not on file   Current Outpatient Medications on File Prior to Visit  Medication Sig Dispense Refill   Accu-Chek Softclix Lancets lancets Use to check blood sugar three times daily E11.65 100 each 5   Accu-Chek Softclix Lancets lancets use lancet to check blood glucose once daily 100 each 3   acetaminophen (TYLENOL) 500 MG tablet Take 1,000 mg by mouth every 6 (six) hours as needed for moderate pain.     acetaZOLAMIDE ER (DIAMOX) 500 MG capsule Take 1 capsule (500 mg total) by mouth daily. 60 capsule 1   albuterol (VENTOLIN HFA) 108 (90 Base)  MCG/ACT inhaler Inhale 1 to 2 puffs by mouth into the lungs every 6 hours as needed for wheezing or shortness of breath. 18 g 0   apixaban (ELIQUIS) 5 MG TABS tablet Take 1 tablet (5 mg total) by mouth 2 (two) times daily for stroke prophylaxis 180 tablet 3   Blood Glucose Monitoring Suppl (ACCU-CHEK GUIDE) w/Device KIT Use to check blood sugar three times daily E11.65 1 kit 0   Blood Glucose Monitoring Suppl (ACCU-CHEK GUIDE) w/Device KIT use kit to check blod glucose 4-5 daily 1 kit 0   brimonidine (ALPHAGAN P) 0.1 % SOLN Place 1 drop into the left eye 2 (two) times daily. (Patient not taking: Reported on 04/10/2023) 5 mL 2   brimonidine (ALPHAGAN) 0.2 % ophthalmic solution Place 1 drop into the right eye 3 (three) times daily.     carvedilol (COREG) 12.5 MG tablet Take 12.5 mg by mouth 2 (two) times daily with a meal.     carvedilol (COREG) 12.5 MG tablet Take 1 tablet (12.5 mg total) by mouth 2 (two) times daily with a meal. 180 tablet 3   cetirizine (ZYRTEC) 10 MG tablet TAKE 1 TABLET (10 MG TOTAL) BY MOUTH DAILY. (Patient taking differently: Take 10 mg by mouth daily as needed for allergies.) 30 tablet 0   Continuous Glucose Receiver (FREESTYLE LIBRE 2 READER) DEVI Use as directed. 1 each 3   Continuous Glucose Sensor (FREESTYLE LIBRE 2 SENSOR) MISC Change every 14 (fourteen) days. 6 each 3   Elastic Bandages & Supports (T.E.D. KNEE LENGTH/S-LONG) MISC 1 (one) each daily, apply compression stockings daily to help decrease leg swelling (Patient not taking: Reported on 01/04/2023) 1 each 0   empagliflozin (JARDIANCE) 10 MG TABS tablet Take 1 tablet (10 mg total) by mouth  daily for diabetes 90 tablet 3   ferrous sulfate 325 (65 FE) MG tablet Take 325 mg by mouth daily with breakfast.     furosemide (LASIX) 40 MG tablet Take 1 tablet (40 mg total) by mouth 2 (two) times daily. (Patient taking differently: Take 40 mg by mouth daily.) 180 tablet 11   gabapentin (NEURONTIN) 300 MG capsule TAKE 2 CAPSULES  BY MOUTH AT BEDTIME (Patient taking differently: Take 300 mg by mouth at bedtime.) 180 capsule 2   glucose blood (ACCU-CHEK GUIDE) test strip Use to check blood sugar three times daily E11.65 100 each 12   glucose blood test strip use strip to check blood glucose 4-5 times daily 300 strip 3   hydrALAZINE (APRESOLINE) 50 MG tablet Take 1 tablet (50 mg total) by mouth 3 (three) times daily. 270 tablet 3   insulin glargine (LANTUS SOLOSTAR) 100 UNIT/ML Solostar Pen Inject 12-14 units subcutaneously in the morning. 30 mL 5   Insulin Pen Needle (B-D UF III MINI PEN NEEDLES) 31G X 5 MM MISC Inject 1 Pen into the skin daily. 100 each 3   Insulin Pen Needle (BD PEN NEEDLE NANO U/F) 32G X 4 MM MISC USE TO INJECT LANTUS DAILY. MUST USE NEW PEN NEEDLE WITH EACH INJECTION. 100 each 3   isosorbide mononitrate (IMDUR) 30 MG 24 hr tablet Take 30 mg by mouth in the morning.     lactulose (CHRONULAC) 10 GM/15ML solution Take 30 mLs (20 g total) by mouth daily. (Patient taking differently: Take 20 g by mouth daily as needed (constipation.).) 1800 mL 3   latanoprost (XALATAN) 0.005 % ophthalmic solution Place 1 drop into the left eye at bedtime. 2.5 mL 2   lisinopril (ZESTRIL) 10 MG tablet Take 1 tablet (10 mg total) by mouth daily. (Patient not taking: Reported on 12/15/2022) 90 tablet 2   losartan (COZAAR) 50 MG tablet Take 1 tablet (50 mg total) by mouth daily. 90 tablet 3   magnesium oxide (MAG-OX) 400 (240 Mg) MG tablet Take 400 mg by mouth daily.     ofloxacin (OCUFLOX) 0.3 % ophthalmic solution Place 1 drop into both eyes 4 (four) times daily. (Patient not taking: Reported on 07/18/2023) 5 mL 0   ondansetron (ZOFRAN) 4 MG tablet Take 1 tablet (4 mg total) by mouth every 6 (six) hours as needed for nausea or vomiting. 12 tablet 0   potassium chloride (MICRO-K) 10 MEQ CR capsule Take 1 capsule (10 mEq total) by mouth daily. 90 capsule 3   potassium chloride SA (KLOR-CON M) 20 MEQ tablet Take 2 tablets (40 mEq total)  by mouth daily. 180 tablet 3   prednisoLONE acetate (PRED FORTE) 1 % ophthalmic suspension Place 1 drop into the right eye 4 (four) times daily for 7 days, THEN 1 drop 3 (three) times daily for 7 days, THEN 1 drop 2 (two) times daily for 7 days, THEN 1 drop daily for 7 days. 10 mL 1   rosuvastatin (CRESTOR) 40 MG tablet TAKE 1 TABLET BY MOUTH DAILY 90 tablet 3   Semaglutide,0.25 or 0.5MG /DOS, 2 MG/3ML SOPN Inject 0.5 mg into the skin once a week. (Patient taking differently: Inject 0.5 mg into the skin every Sunday.) 3 mL 3   tamsulosin (FLOMAX) 0.4 MG CAPS capsule TAKE 1 CAPSULE BY MOUTH AT BEDTIME (STOP IF DIZZINESS OCCURS) 90 capsule 2   [DISCONTINUED] amLODipine (NORVASC) 10 MG tablet Take 1 tablet (10 mg total) by mouth daily. 30 tablet 0   No current  facility-administered medications on file prior to visit.   No Known Allergies Family History  Problem Relation Age of Onset   Diabetes Mother    Hypertension Mother    Diabetes Father    Heart attack Father    Diabetes Sister    Heart attack Maternal Grandmother    Colon cancer Neg Hx    Colon polyps Neg Hx    Esophageal cancer Neg Hx    Rectal cancer Neg Hx    Stomach cancer Neg Hx    PE: There were no vitals taken for this visit. Wt Readings from Last 3 Encounters:  08/20/23 163 lb (73.9 kg)  08/08/23 163 lb 12.8 oz (74.3 kg)  08/01/23 163 lb 12.8 oz (74.3 kg)   Constitutional: overweight, in NAD; had difficulty rising from the chair, had to be helped Eyes: no exophthalmos ENT: no thyromegaly, no cervical lymphadenopathy Cardiovascular: RRR, No MRG Respiratory: CTA B Musculoskeletal: no deformities except first 2 right toes amputated Skin: no rashes Neurological: no tremor with outstretched hands  ASSESSMENT: 1. DM2, insulin-dependent, uncontrolled, with complications - CAD - diastolic and systolic CHF - CVAs - CKD stage 3 - DR - peripheral neuropathy, s/p amputations 1st and 2nd R toes - recurring hypoglycemia  -admitted with hypoglycemia induced encephalopathy on 03/10/2022 - glu of 40 and was unresponsive.  Admitted again with hypoglycemia at 46 on 03/31/2022. - recurring hyperglycemia  2. HL  PLAN:  1. Patient with longstanding type 2 diabetes, previously basal left bolus insulin regimen, with recurrent episodes of hypoglycemia.  He was able to stop mealtime insulin when starting Ozempic.  At last visit, he was still drinking sweet drinks (juice, after he stopped drinking regular sodas).  I strongly advised him to stop any sweet drinks as he had spikes in the 200s and 300s after juice.  I also recommended to restart the CGM, as this was off at last visit.  He prefered to have a CGM receiver, since he did not have a smart phone.  We did not change the regimen otherwise.  - I suggested to:  Patient Instructions  Please continue: - Lantus 12-14 units in a.m. - Ozempic 0.5 mg weekly in a.m.  NO SWEET DRINK! NO SODAS, JUICE, SWEET TEA!  Please return in 3 months.  - we checked his HbA1c: 7%  - advised to check sugars at different times of the day - 1x a day, rotating check times - advised for yearly eye exams >> he is UTD - He has poor circulation and sensation in his feet and a history of transmetatarsal amputation.  I recommended to establish care with [facility].  He saw Dr. Al Corpus in 11/2022. - return to clinic in 3-4 months  2. HL -Reviewed latest lipid panel from 2022: All fractions abnormal: Lab Results  Component Value Date   CHOL 201 (H) 07/02/2021   HDL 36 (L) 07/02/2021   LDLCALC 120 (H) 07/02/2021   TRIG 225 (H) 07/02/2021   CHOLHDL 5.6 07/02/2021  -He continues on Crestor 40 mg daily without side effects -He had another lipid panel 2 weeks prior to our last visit -I advised him to review the results  Carlus Pavlov, MD PhD [facility] Endocrinology

## 2023-10-09 ENCOUNTER — Other Ambulatory Visit: Payer: Self-pay

## 2023-10-10 ENCOUNTER — Other Ambulatory Visit: Payer: Self-pay

## 2023-10-13 ENCOUNTER — Telehealth: Payer: Self-pay | Admitting: Emergency Medicine

## 2023-10-13 NOTE — Telephone Encounter (Signed)
Patient has been dismissed from [facility] Endocrinology due to too many missed appointments.

## 2023-10-16 ENCOUNTER — Other Ambulatory Visit: Payer: Self-pay

## 2023-10-20 ENCOUNTER — Other Ambulatory Visit: Payer: Self-pay

## 2023-10-23 ENCOUNTER — Other Ambulatory Visit: Payer: Self-pay

## 2023-10-23 MED ORDER — CHOLECALCIFEROL 25 MCG (1000 UT) PO TABS
1000.0000 [IU] | ORAL_TABLET | Freq: Every day | ORAL | 3 refills | Status: AC
Start: 2023-10-23 — End: ?
  Filled 2023-10-24: qty 90, 90d supply, fill #0
  Filled 2024-01-18 – 2024-09-05 (×2): qty 90, 90d supply, fill #1

## 2023-10-24 ENCOUNTER — Other Ambulatory Visit: Payer: Self-pay

## 2023-10-26 ENCOUNTER — Other Ambulatory Visit: Payer: Self-pay

## 2023-10-26 MED ORDER — SODIUM BICARBONATE 650 MG PO TABS
650.0000 mg | ORAL_TABLET | Freq: Two times a day (BID) | ORAL | 3 refills | Status: AC
  Filled 2023-10-26: qty 60, 30d supply, fill #0
  Filled 2023-11-20: qty 60, 30d supply, fill #1
  Filled 2023-12-19 – 2024-01-25 (×4): qty 60, 30d supply, fill #2
  Filled 2024-02-26: qty 60, 30d supply, fill #3

## 2023-10-26 MED ORDER — POTASSIUM CHLORIDE ER 10 MEQ PO CPCR
20.0000 meq | ORAL_CAPSULE | Freq: Every day | ORAL | 3 refills | Status: DC
Start: 2023-10-26 — End: 2024-02-06
  Filled 2023-10-26 – 2023-10-30 (×3): qty 180, 90d supply, fill #0

## 2023-10-27 ENCOUNTER — Other Ambulatory Visit: Payer: Self-pay

## 2023-10-30 ENCOUNTER — Other Ambulatory Visit: Payer: Self-pay

## 2023-10-31 ENCOUNTER — Other Ambulatory Visit: Payer: Self-pay

## 2023-11-06 ENCOUNTER — Other Ambulatory Visit: Payer: Self-pay

## 2023-11-10 ENCOUNTER — Other Ambulatory Visit: Payer: Self-pay

## 2023-11-20 ENCOUNTER — Other Ambulatory Visit: Payer: Self-pay

## 2023-11-21 ENCOUNTER — Other Ambulatory Visit: Payer: Self-pay

## 2023-11-30 ENCOUNTER — Other Ambulatory Visit: Payer: Self-pay

## 2023-12-01 ENCOUNTER — Other Ambulatory Visit: Payer: Self-pay

## 2023-12-07 ENCOUNTER — Other Ambulatory Visit: Payer: Self-pay

## 2023-12-12 ENCOUNTER — Other Ambulatory Visit: Payer: Self-pay

## 2023-12-15 ENCOUNTER — Other Ambulatory Visit: Payer: Self-pay

## 2023-12-19 ENCOUNTER — Other Ambulatory Visit: Payer: Self-pay

## 2024-01-01 ENCOUNTER — Other Ambulatory Visit: Payer: Self-pay

## 2024-01-02 ENCOUNTER — Other Ambulatory Visit: Payer: Self-pay

## 2024-01-05 ENCOUNTER — Other Ambulatory Visit: Payer: Self-pay

## 2024-01-11 ENCOUNTER — Other Ambulatory Visit: Payer: Self-pay

## 2024-01-12 ENCOUNTER — Other Ambulatory Visit: Payer: Self-pay

## 2024-01-18 ENCOUNTER — Other Ambulatory Visit: Payer: Self-pay

## 2024-01-19 ENCOUNTER — Ambulatory Visit (INDEPENDENT_AMBULATORY_CARE_PROVIDER_SITE_OTHER): Payer: 59

## 2024-01-19 DIAGNOSIS — I428 Other cardiomyopathies: Secondary | ICD-10-CM | POA: Diagnosis not present

## 2024-01-20 LAB — CUP PACEART REMOTE DEVICE CHECK
Battery Remaining Longevity: 52 mo
Battery Voltage: 2.98 V
Brady Statistic RV Percent Paced: 0.01 %
Date Time Interrogation Session: 20250207022710
HighPow Impedance: 58 Ohm
Implantable Lead Connection Status: 753985
Implantable Lead Implant Date: 20190204
Implantable Lead Location: 753860
Implantable Pulse Generator Implant Date: 20190204
Lead Channel Impedance Value: 323 Ohm
Lead Channel Impedance Value: 399 Ohm
Lead Channel Pacing Threshold Amplitude: 1 V
Lead Channel Pacing Threshold Pulse Width: 0.4 ms
Lead Channel Sensing Intrinsic Amplitude: 9.125 mV
Lead Channel Sensing Intrinsic Amplitude: 9.125 mV
Lead Channel Setting Pacing Amplitude: 2.5 V
Lead Channel Setting Pacing Pulse Width: 0.4 ms
Lead Channel Setting Sensing Sensitivity: 0.3 mV
Zone Setting Status: 755011
Zone Setting Status: 755011

## 2024-01-24 ENCOUNTER — Other Ambulatory Visit: Payer: Self-pay

## 2024-01-25 ENCOUNTER — Other Ambulatory Visit: Payer: Self-pay

## 2024-01-26 ENCOUNTER — Other Ambulatory Visit: Payer: Self-pay

## 2024-01-29 ENCOUNTER — Other Ambulatory Visit: Payer: Self-pay

## 2024-01-30 ENCOUNTER — Other Ambulatory Visit: Payer: Self-pay | Admitting: Nurse Practitioner

## 2024-01-30 ENCOUNTER — Other Ambulatory Visit: Payer: Self-pay

## 2024-01-30 ENCOUNTER — Ambulatory Visit
Admission: RE | Admit: 2024-01-30 | Discharge: 2024-01-30 | Disposition: A | Discharge status: Home / Self Care | Payer: 59 | Source: Ambulatory Visit | Attending: Nurse Practitioner

## 2024-01-30 DIAGNOSIS — M79672 Pain in left foot: Secondary | ICD-10-CM

## 2024-01-30 MED ORDER — DOXYCYCLINE HYCLATE 100 MG PO CAPS
100.0000 mg | ORAL_CAPSULE | Freq: Two times a day (BID) | ORAL | 0 refills | Status: AC
  Filled 2024-01-30: qty 14, 7d supply, fill #0

## 2024-02-01 ENCOUNTER — Other Ambulatory Visit: Payer: Self-pay

## 2024-02-02 ENCOUNTER — Other Ambulatory Visit: Payer: Self-pay | Admitting: *Deleted

## 2024-02-02 DIAGNOSIS — I96 Gangrene, not elsewhere classified: Secondary | ICD-10-CM

## 2024-02-05 ENCOUNTER — Ambulatory Visit (HOSPITAL_COMMUNITY)
Admission: RE | Admit: 2024-02-05 | Discharge: 2024-02-05 | Disposition: A | Discharge status: Home / Self Care | Payer: 59 | Source: Ambulatory Visit | Attending: Surgery | Admitting: Surgery

## 2024-02-05 ENCOUNTER — Other Ambulatory Visit: Payer: Self-pay

## 2024-02-05 ENCOUNTER — Ambulatory Visit (INDEPENDENT_AMBULATORY_CARE_PROVIDER_SITE_OTHER): Payer: 59 | Admitting: Surgery

## 2024-02-05 ENCOUNTER — Encounter: Payer: Self-pay | Admitting: Surgery

## 2024-02-05 VITALS — BP 129/86 | HR 82 | Temp 98.6°F | Resp 20 | Ht 67.0 in | Wt 163.0 lb

## 2024-02-05 DIAGNOSIS — I96 Gangrene, not elsewhere classified: Secondary | ICD-10-CM | POA: Diagnosis present

## 2024-02-05 DIAGNOSIS — I7025 Atherosclerosis of native arteries of other extremities with ulceration: Secondary | ICD-10-CM

## 2024-02-05 LAB — VAS US ABI WITH/WO TBI

## 2024-02-05 MED ORDER — TAMSULOSIN HCL 0.4 MG PO CAPS
0.4000 mg | ORAL_CAPSULE | Freq: Every evening | ORAL | 4 refills | Status: AC
  Filled 2024-02-05: qty 90, 90d supply, fill #0
  Filled 2024-12-06: qty 90, 90d supply, fill #1

## 2024-02-05 NOTE — H&P (View-Only) (Signed)
 [facility] [place]  Patient name: Jeffrey [name] MRN: [id number] DOB: [birth year] Sex: male   REASON FOR VISIT:    Follow-up  HISOTRY OF PRESENT ILLNESS:    Jeffrey [name] is a 58 y.o. male who is known to me having undergone right sided Barostim implant in 2022.  He now has a left great toe ulcer.  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  He is here for vascular evaluation.  He does not have claudication symptoms or rest pain.  The patient has a single-chamber ICD in place since 2019. He has a history of coronary artery disease, status post cath in 2018 for which medical management was recommended. He is a type II diabetic. He is medically managed for hypertension. He is on a statin for hypercholesterolemia.  PAST MEDICAL HISTORY:   Past Medical History:  Diagnosis Date   AICD (automatic cardioverter/defibrillator) present    Medtronic   AICD (automatic cardioverter/defibrillator) present    MDT Visia AF MRI   Anemia    CAD (coronary artery disease)    a. cath 01/31/17: 60% 1st RPLB, 60% dist RCA, 55% prox RCA, 10% pro LAD --> Rx TX.    CHF (congestive heart failure) (HCC)    Chronic systolic CHF (congestive heart failure) (HCC) 01/28/2017   1. Echo 01/29/17:  EF 20-25, normal wall motion, mild LAE // 2. EF 10-15 by [facility] 01/2017    Diabetes mellitus without complication (HCC)    type 2   Diabetic foot infection (HCC) 03/2016   RT FOOT   Dyspnea    History of kidney stones    passed   HTN (hypertension)    Hyperlipidemia    Hypertension    Myocardial infarction [facility]), although reported, NICM at cath    NICM (nonischemic cardiomyopathy) (HCC) 02/15/2017   1. Mod non-obs CAD on LHC in 01/2017 - CAD does not explain cardiomyopathy   Renal disorder    stage 4   Sleep apnea, unspecified 07/21/2023     FAMILY HISTORY:   Family History  Problem Relation Age of Onset   Diabetes Mother    Hypertension  Mother    Diabetes Father    Heart attack Father    Diabetes Sister    Heart attack Maternal Grandmother    Colon cancer Neg Hx    Colon polyps Neg Hx    Esophageal cancer Neg Hx    Rectal cancer Neg Hx    Stomach cancer Neg Hx     SOCIAL HISTORY:   Social History   Tobacco Use   Smoking status: Never   Smokeless tobacco: Never  Substance Use Topics   Alcohol use: Never     ALLERGIES:   No Known Allergies   CURRENT MEDICATIONS:   Current Outpatient Medications  Medication Sig Dispense Refill   Accu-Chek Softclix Lancets lancets Use to check blood sugar three times daily E11.65 100 each 5   Accu-Chek Softclix Lancets lancets use lancet to check blood glucose once daily 100 each 3   acetaminophen (TYLENOL) 500 MG tablet Take 1,000 mg by mouth every 6 (six) hours as needed for moderate pain.     acetaZOLAMIDE ER (DIAMOX) 500 MG capsule Take 1 capsule (500 mg total) by mouth daily. 60 capsule 1   albuterol (VENTOLIN HFA) 108 (90 Base) MCG/ACT inhaler Inhale 1 to 2 puffs by mouth into the lungs every 6 hours as needed for wheezing or shortness of breath. 18 g 0  apixaban (ELIQUIS) 5 MG TABS tablet Take 1 tablet (5 mg total) by mouth 2 (two) times daily for stroke prophylaxis 180 tablet 3   Blood Glucose Monitoring Suppl (ACCU-CHEK GUIDE) w/Device KIT Use to check blood sugar three times daily E11.65 1 kit 0   Blood Glucose Monitoring Suppl (ACCU-CHEK GUIDE) w/Device KIT use kit to check blod glucose 4-5 daily 1 kit 0   brimonidine (ALPHAGAN P) 0.1 % SOLN Place 1 drop into the left eye 2 (two) times daily. 5 mL 2   brimonidine (ALPHAGAN) 0.2 % ophthalmic solution Place 1 drop into the right eye 3 (three) times daily.     carvedilol (COREG) 12.5 MG tablet Take 12.5 mg by mouth 2 (two) times daily with a meal.     carvedilol (COREG) 12.5 MG tablet Take 1 tablet (12.5 mg total) by mouth 2 (two) times daily with a meal. 180 tablet 3   cetirizine (ZYRTEC) 10 MG tablet TAKE 1 TABLET  (10 MG TOTAL) BY MOUTH DAILY. (Patient taking differently: Take 10 mg by mouth daily as needed for allergies.) 30 tablet 0   Cholecalciferol 25 MCG (1000 UT) tablet Take 1 tablet (1,000 Units total) by mouth daily. 90 tablet 3   Continuous Glucose Receiver (FREESTYLE LIBRE 2 READER) DEVI Use as directed. 1 each 3   Continuous Glucose Sensor (FREESTYLE LIBRE 2 SENSOR) MISC Change every 14 (fourteen) days. 6 each 3   doxycycline (VIBRAMYCIN) 100 MG capsule Take 1 capsule (100 mg total) by mouth 2 (two) times daily for 7 days. 14 capsule 0   Elastic Bandages & Supports (T.E.D. KNEE LENGTH/S-LONG) MISC 1 (one) each daily, apply compression stockings daily to help decrease leg swelling 1 each 0   empagliflozin (JARDIANCE) 10 MG TABS tablet Take 1 tablet (10 mg total) by mouth daily for diabetes 90 tablet 3   ferrous sulfate 325 (65 FE) MG tablet Take 325 mg by mouth daily with breakfast.     furosemide (LASIX) 40 MG tablet Take 1 tablet (40 mg total) by mouth 2 (two) times daily. (Patient taking differently: Take 40 mg by mouth daily.) 180 tablet 11   gabapentin (NEURONTIN) 300 MG capsule TAKE 2 CAPSULES BY MOUTH AT BEDTIME (Patient taking differently: Take 300 mg by mouth at bedtime.) 180 capsule 2   glucose blood (ACCU-CHEK GUIDE) test strip Use to check blood sugar three times daily E11.65 100 each 12   glucose blood test strip use strip to check blood glucose 4-5 times daily 300 strip 3   hydrALAZINE (APRESOLINE) 50 MG tablet Take 1 tablet (50 mg total) by mouth 3 (three) times daily. 270 tablet 3   insulin glargine (LANTUS SOLOSTAR) 100 UNIT/ML Solostar Pen Inject 12-14 units subcutaneously in the morning. 30 mL 5   Insulin Pen Needle (B-D UF III MINI PEN NEEDLES) 31G X 5 MM MISC Inject 1 Pen into the skin daily. 100 each 3   Insulin Pen Needle (BD PEN NEEDLE NANO U/F) 32G X 4 MM MISC USE TO INJECT LANTUS DAILY. MUST USE NEW PEN NEEDLE WITH EACH INJECTION. 100 each 3   isosorbide mononitrate (IMDUR)  30 MG 24 hr tablet Take 30 mg by mouth in the morning.     lactulose (CHRONULAC) 10 GM/15ML solution Take 30 mLs (20 g total) by mouth daily. (Patient taking differently: Take 20 g by mouth daily as needed (constipation.).) 1800 mL 3   latanoprost (XALATAN) 0.005 % ophthalmic solution Place 1 drop into the left eye at bedtime. 2.5  mL 2   lisinopril (ZESTRIL) 10 MG tablet Take 1 tablet (10 mg total) by mouth daily. 90 tablet 2   losartan (COZAAR) 50 MG tablet Take 1 tablet (50 mg total) by mouth daily. 90 tablet 3   magnesium oxide (MAG-OX) 400 (240 Mg) MG tablet Take 400 mg by mouth daily.     ofloxacin (OCUFLOX) 0.3 % ophthalmic solution Place 1 drop into both eyes 4 (four) times daily. 5 mL 0   ondansetron (ZOFRAN) 4 MG tablet Take 1 tablet (4 mg total) by mouth every 6 (six) hours as needed for nausea or vomiting. 12 tablet 0   potassium chloride (MICRO-K) 10 MEQ CR capsule Take 1 capsule (10 mEq total) by mouth daily. 90 capsule 3   potassium chloride (MICRO-K) 10 MEQ CR capsule Take 2 capsules (20 mEq total) by mouth daily. 180 capsule 3   potassium chloride SA (KLOR-CON M) 20 MEQ tablet Take 2 tablets (40 mEq total) by mouth daily. 180 tablet 3   rosuvastatin (CRESTOR) 40 MG tablet TAKE 1 TABLET BY MOUTH DAILY 90 tablet 3   Semaglutide,0.25 or 0.5MG /DOS, 2 MG/3ML SOPN Inject 0.5 mg into the skin once a week. (Patient taking differently: Inject 0.5 mg into the skin every Sunday.) 3 mL 3   sodium bicarbonate 650 MG tablet Take 1 tablet (650 mg total) by mouth 2 (two) times daily. 60 tablet 3   tamsulosin (FLOMAX) 0.4 MG CAPS capsule TAKE 1 CAPSULE BY MOUTH AT BEDTIME (STOP IF DIZZINESS OCCURS) 90 capsule 2   No current facility-administered medications for this visit.    REVIEW OF SYSTEMS:   [X]  denotes positive finding, [ ]  denotes negative finding Cardiac  Comments:  Chest pain or chest pressure:    Shortness of breath upon exertion:    Short of breath when lying flat:    Irregular  heart rhythm:        Vascular    Pain in calf, thigh, or hip brought on by ambulation:    Pain in feet at night that wakes you up from your sleep:     Blood clot in your veins:    Leg swelling:         Pulmonary    Oxygen at home:    Productive cough:     Wheezing:         Neurologic    Sudden weakness in arms or legs:     Sudden numbness in arms or legs:     Sudden onset of difficulty speaking or slurred speech:    Temporary loss of vision in one eye:     Problems with dizziness:         Gastrointestinal    Blood in stool:     Vomited blood:         Genitourinary    Burning when urinating:     Blood in urine:        Psychiatric    Major depression:         Hematologic    Bleeding problems:    Problems with blood clotting too easily:        Skin    Rashes or ulcers:        Constitutional    Fever or chills:      PHYSICAL EXAM:   Vitals:   02/05/24 1418  BP: 129/86  Pulse: 82  Resp: 20  Temp: 98.6 F (37 C)  SpO2: 97%  Weight: 163 lb (73.9 kg)  Height:  5\' 7"  (1.702 m)    GENERAL: The patient is a well-nourished male, in no acute distress. The vital signs are documented above. CARDIAC: There is a regular rate and rhythm.  VASCULAR: Palpable femoral pulses PULMONARY: Non-labored respirations MUSCULOSKELETAL: There are no major deformities or cyanosis. NEUROLOGIC: No focal weakness or paresthesias are detected. SKIN: See photo below PSYCHIATRIC: The patient has a normal affect.  STUDIES:   I have reviewed the following: ABI Findings:  +---------+------------------+-----+----------+----------+  Right   Rt Pressure (mmHg)IndexWaveform  Comment     +---------+------------------+-----+----------+----------+  Brachial 139                                          +---------+------------------+-----+----------+----------+  ATA     255               1.82 monophasic            +---------+------------------+-----+----------+----------+   PTA     0                 0.00                       +---------+------------------+-----+----------+----------+  Great Toe                                 amputation  +---------+------------------+-----+----------+----------+   +---------+------------------+-----+----------+-------+  Left    Lt Pressure (mmHg)IndexWaveform  Comment  +---------+------------------+-----+----------+-------+  Brachial 140                                       +---------+------------------+-----+----------+-------+  ATA     255               1.82 monophasic         +---------+------------------+-----+----------+-------+  PTA     0                 0.00                    +---------+------------------+-----+----------+-------+  Great Toe255               1.82                    +---------+------------------+-----+----------+-------+   +-------+-----------+-----------+------------+------------+  ABI/TBIToday's ABIToday's TBIPrevious ABIPrevious TBI  +-------+-----------+-----------+------------+------------+  Right [place]         amp                                  +-------+-----------+-----------+------------+------------+  Left  [place]                                            +-------+-----------+-----------+------------+------------+    MEDICAL ISSUES:   Lower extremity atherosclerotic vascular disease with left great toe ulcer: The patient has monophasic tibial waveforms.  ABIs were noncompressible and his toe pressures are markedly elevated suggesting underlying medial calcification which makes noninvasive imaging not accurate.  I suspect he has either outflow or runoff disease because of his monophasic tibial waveforms and palpable femoral pulses.  I think the next step is to proceed with angiography via right femoral approach with left leg evaluation and intervention if indicated.  This would likely be a SFA stent and tibial angioplasty.  All  questions were answered.  Again this is for a limb threatening situation.  This will be scheduled for Tuesday, March 4.  He will stop his Eliquis.    Charlena Cross, MD, FACS [facility] [phone] Pager [phone]

## 2024-02-05 NOTE — Progress Notes (Signed)
 [facility] [place]  Patient name: Jeffrey [name] MRN: [id number] DOB: [birth year] Sex: male   REASON FOR VISIT:    Follow-up  HISOTRY OF PRESENT ILLNESS:    Jeffrey [name] is a 58 y.o. male who is known to me having undergone right sided Barostim implant in 2022.  He now has a left great toe ulcer.  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  He is here for vascular evaluation.  He does not have claudication symptoms or rest pain.  The patient has a single-chamber ICD in place since 2019. He has a history of coronary artery disease, status post cath in 2018 for which medical management was recommended. He is a type II diabetic. He is medically managed for hypertension. He is on a statin for hypercholesterolemia.  PAST MEDICAL HISTORY:   Past Medical History:  Diagnosis Date   AICD (automatic cardioverter/defibrillator) present    Medtronic   AICD (automatic cardioverter/defibrillator) present    MDT Visia AF MRI   Anemia    CAD (coronary artery disease)    a. cath 01/31/17: 60% 1st RPLB, 60% dist RCA, 55% prox RCA, 10% pro LAD --> Rx TX.    CHF (congestive heart failure) (HCC)    Chronic systolic CHF (congestive heart failure) (HCC) 01/28/2017   1. Echo 01/29/17:  EF 20-25, normal wall motion, mild LAE // 2. EF 10-15 by [facility] 01/2017    Diabetes mellitus without complication (HCC)    type 2   Diabetic foot infection (HCC) 03/2016   RT FOOT   Dyspnea    History of kidney stones    passed   HTN (hypertension)    Hyperlipidemia    Hypertension    Myocardial infarction [facility]), although reported, NICM at cath    NICM (nonischemic cardiomyopathy) (HCC) 02/15/2017   1. Mod non-obs CAD on LHC in 01/2017 - CAD does not explain cardiomyopathy   Renal disorder    stage 4   Sleep apnea, unspecified 07/21/2023     FAMILY HISTORY:   Family History  Problem Relation Age of Onset   Diabetes Mother    Hypertension  Mother    Diabetes Father    Heart attack Father    Diabetes Sister    Heart attack Maternal Grandmother    Colon cancer Neg Hx    Colon polyps Neg Hx    Esophageal cancer Neg Hx    Rectal cancer Neg Hx    Stomach cancer Neg Hx     SOCIAL HISTORY:   Social History   Tobacco Use   Smoking status: Never   Smokeless tobacco: Never  Substance Use Topics   Alcohol use: Never     ALLERGIES:   No Known Allergies   CURRENT MEDICATIONS:   Current Outpatient Medications  Medication Sig Dispense Refill   Accu-Chek Softclix Lancets lancets Use to check blood sugar three times daily E11.65 100 each 5   Accu-Chek Softclix Lancets lancets use lancet to check blood glucose once daily 100 each 3   acetaminophen (TYLENOL) 500 MG tablet Take 1,000 mg by mouth every 6 (six) hours as needed for moderate pain.     acetaZOLAMIDE ER (DIAMOX) 500 MG capsule Take 1 capsule (500 mg total) by mouth daily. 60 capsule 1   albuterol (VENTOLIN HFA) 108 (90 Base) MCG/ACT inhaler Inhale 1 to 2 puffs by mouth into the lungs every 6 hours as needed for wheezing or shortness of breath. 18 g 0  apixaban (ELIQUIS) 5 MG TABS tablet Take 1 tablet (5 mg total) by mouth 2 (two) times daily for stroke prophylaxis 180 tablet 3   Blood Glucose Monitoring Suppl (ACCU-CHEK GUIDE) w/Device KIT Use to check blood sugar three times daily E11.65 1 kit 0   Blood Glucose Monitoring Suppl (ACCU-CHEK GUIDE) w/Device KIT use kit to check blod glucose 4-5 daily 1 kit 0   brimonidine (ALPHAGAN P) 0.1 % SOLN Place 1 drop into the left eye 2 (two) times daily. 5 mL 2   brimonidine (ALPHAGAN) 0.2 % ophthalmic solution Place 1 drop into the right eye 3 (three) times daily.     carvedilol (COREG) 12.5 MG tablet Take 12.5 mg by mouth 2 (two) times daily with a meal.     carvedilol (COREG) 12.5 MG tablet Take 1 tablet (12.5 mg total) by mouth 2 (two) times daily with a meal. 180 tablet 3   cetirizine (ZYRTEC) 10 MG tablet TAKE 1 TABLET  (10 MG TOTAL) BY MOUTH DAILY. (Patient taking differently: Take 10 mg by mouth daily as needed for allergies.) 30 tablet 0   Cholecalciferol 25 MCG (1000 UT) tablet Take 1 tablet (1,000 Units total) by mouth daily. 90 tablet 3   Continuous Glucose Receiver (FREESTYLE LIBRE 2 READER) DEVI Use as directed. 1 each 3   Continuous Glucose Sensor (FREESTYLE LIBRE 2 SENSOR) MISC Change every 14 (fourteen) days. 6 each 3   doxycycline (VIBRAMYCIN) 100 MG capsule Take 1 capsule (100 mg total) by mouth 2 (two) times daily for 7 days. 14 capsule 0   Elastic Bandages & Supports (T.E.D. KNEE LENGTH/S-LONG) MISC 1 (one) each daily, apply compression stockings daily to help decrease leg swelling 1 each 0   empagliflozin (JARDIANCE) 10 MG TABS tablet Take 1 tablet (10 mg total) by mouth daily for diabetes 90 tablet 3   ferrous sulfate 325 (65 FE) MG tablet Take 325 mg by mouth daily with breakfast.     furosemide (LASIX) 40 MG tablet Take 1 tablet (40 mg total) by mouth 2 (two) times daily. (Patient taking differently: Take 40 mg by mouth daily.) 180 tablet 11   gabapentin (NEURONTIN) 300 MG capsule TAKE 2 CAPSULES BY MOUTH AT BEDTIME (Patient taking differently: Take 300 mg by mouth at bedtime.) 180 capsule 2   glucose blood (ACCU-CHEK GUIDE) test strip Use to check blood sugar three times daily E11.65 100 each 12   glucose blood test strip use strip to check blood glucose 4-5 times daily 300 strip 3   hydrALAZINE (APRESOLINE) 50 MG tablet Take 1 tablet (50 mg total) by mouth 3 (three) times daily. 270 tablet 3   insulin glargine (LANTUS SOLOSTAR) 100 UNIT/ML Solostar Pen Inject 12-14 units subcutaneously in the morning. 30 mL 5   Insulin Pen Needle (B-D UF III MINI PEN NEEDLES) 31G X 5 MM MISC Inject 1 Pen into the skin daily. 100 each 3   Insulin Pen Needle (BD PEN NEEDLE NANO U/F) 32G X 4 MM MISC USE TO INJECT LANTUS DAILY. MUST USE NEW PEN NEEDLE WITH EACH INJECTION. 100 each 3   isosorbide mononitrate (IMDUR)  30 MG 24 hr tablet Take 30 mg by mouth in the morning.     lactulose (CHRONULAC) 10 GM/15ML solution Take 30 mLs (20 g total) by mouth daily. (Patient taking differently: Take 20 g by mouth daily as needed (constipation.).) 1800 mL 3   latanoprost (XALATAN) 0.005 % ophthalmic solution Place 1 drop into the left eye at bedtime. 2.5  mL 2   lisinopril (ZESTRIL) 10 MG tablet Take 1 tablet (10 mg total) by mouth daily. 90 tablet 2   losartan (COZAAR) 50 MG tablet Take 1 tablet (50 mg total) by mouth daily. 90 tablet 3   magnesium oxide (MAG-OX) 400 (240 Mg) MG tablet Take 400 mg by mouth daily.     ofloxacin (OCUFLOX) 0.3 % ophthalmic solution Place 1 drop into both eyes 4 (four) times daily. 5 mL 0   ondansetron (ZOFRAN) 4 MG tablet Take 1 tablet (4 mg total) by mouth every 6 (six) hours as needed for nausea or vomiting. 12 tablet 0   potassium chloride (MICRO-K) 10 MEQ CR capsule Take 1 capsule (10 mEq total) by mouth daily. 90 capsule 3   potassium chloride (MICRO-K) 10 MEQ CR capsule Take 2 capsules (20 mEq total) by mouth daily. 180 capsule 3   potassium chloride SA (KLOR-CON M) 20 MEQ tablet Take 2 tablets (40 mEq total) by mouth daily. 180 tablet 3   rosuvastatin (CRESTOR) 40 MG tablet TAKE 1 TABLET BY MOUTH DAILY 90 tablet 3   Semaglutide,0.25 or 0.5MG /DOS, 2 MG/3ML SOPN Inject 0.5 mg into the skin once a week. (Patient taking differently: Inject 0.5 mg into the skin every Sunday.) 3 mL 3   sodium bicarbonate 650 MG tablet Take 1 tablet (650 mg total) by mouth 2 (two) times daily. 60 tablet 3   tamsulosin (FLOMAX) 0.4 MG CAPS capsule TAKE 1 CAPSULE BY MOUTH AT BEDTIME (STOP IF DIZZINESS OCCURS) 90 capsule 2   No current facility-administered medications for this visit.    REVIEW OF SYSTEMS:   [X]  denotes positive finding, [ ]  denotes negative finding Cardiac  Comments:  Chest pain or chest pressure:    Shortness of breath upon exertion:    Short of breath when lying flat:    Irregular  heart rhythm:        Vascular    Pain in calf, thigh, or hip brought on by ambulation:    Pain in feet at night that wakes you up from your sleep:     Blood clot in your veins:    Leg swelling:         Pulmonary    Oxygen at home:    Productive cough:     Wheezing:         Neurologic    Sudden weakness in arms or legs:     Sudden numbness in arms or legs:     Sudden onset of difficulty speaking or slurred speech:    Temporary loss of vision in one eye:     Problems with dizziness:         Gastrointestinal    Blood in stool:     Vomited blood:         Genitourinary    Burning when urinating:     Blood in urine:        Psychiatric    Major depression:         Hematologic    Bleeding problems:    Problems with blood clotting too easily:        Skin    Rashes or ulcers:        Constitutional    Fever or chills:      PHYSICAL EXAM:   Vitals:   02/05/24 1418  BP: 129/86  Pulse: 82  Resp: 20  Temp: 98.6 F (37 C)  SpO2: 97%  Weight: 163 lb (73.9 kg)  Height:  5\' 7"  (1.702 m)    GENERAL: The patient is a well-nourished male, in no acute distress. The vital signs are documented above. CARDIAC: There is a regular rate and rhythm.  VASCULAR: Palpable femoral pulses PULMONARY: Non-labored respirations MUSCULOSKELETAL: There are no major deformities or cyanosis. NEUROLOGIC: No focal weakness or paresthesias are detected. SKIN: See photo below PSYCHIATRIC: The patient has a normal affect.  STUDIES:   I have reviewed the following: ABI Findings:  +---------+------------------+-----+----------+----------+  Right   Rt Pressure (mmHg)IndexWaveform  Comment     +---------+------------------+-----+----------+----------+  Brachial 139                                          +---------+------------------+-----+----------+----------+  ATA     255               1.82 monophasic            +---------+------------------+-----+----------+----------+   PTA     0                 0.00                       +---------+------------------+-----+----------+----------+  Great Toe                                 amputation  +---------+------------------+-----+----------+----------+   +---------+------------------+-----+----------+-------+  Left    Lt Pressure (mmHg)IndexWaveform  Comment  +---------+------------------+-----+----------+-------+  Brachial 140                                       +---------+------------------+-----+----------+-------+  ATA     255               1.82 monophasic         +---------+------------------+-----+----------+-------+  PTA     0                 0.00                    +---------+------------------+-----+----------+-------+  Great Toe255               1.82                    +---------+------------------+-----+----------+-------+   +-------+-----------+-----------+------------+------------+  ABI/TBIToday's ABIToday's TBIPrevious ABIPrevious TBI  +-------+-----------+-----------+------------+------------+  Right [place]         amp                                  +-------+-----------+-----------+------------+------------+  Left  [place]                                            +-------+-----------+-----------+------------+------------+    MEDICAL ISSUES:   Lower extremity atherosclerotic vascular disease with left great toe ulcer: The patient has monophasic tibial waveforms.  ABIs were noncompressible and his toe pressures are markedly elevated suggesting underlying medial calcification which makes noninvasive imaging not accurate.  I suspect he has either outflow or runoff disease because of his monophasic tibial waveforms and palpable femoral pulses.  I think the next step is to proceed with angiography via right femoral approach with left leg evaluation and intervention if indicated.  This would likely be a SFA stent and tibial angioplasty.  All  questions were answered.  Again this is for a limb threatening situation.  This will be scheduled for Tuesday, March 4.  He will stop his Eliquis.    Charlena Cross, MD, FACS [facility] [phone] Pager [phone]

## 2024-02-06 ENCOUNTER — Other Ambulatory Visit: Payer: Self-pay

## 2024-02-06 DIAGNOSIS — I7025 Atherosclerosis of native arteries of other extremities with ulceration: Secondary | ICD-10-CM

## 2024-02-13 ENCOUNTER — Other Ambulatory Visit: Payer: Self-pay

## 2024-02-13 ENCOUNTER — Encounter (HOSPITAL_COMMUNITY)
Admission: RE | Disposition: A | Discharge status: Home / Self Care | Payer: Self-pay | Source: Home / Self Care | Attending: Surgery

## 2024-02-13 ENCOUNTER — Ambulatory Visit (HOSPITAL_COMMUNITY)
Admission: RE | Admit: 2024-02-13 | Discharge: 2024-02-13 | Disposition: A | Discharge status: Home / Self Care | Payer: 59 | Attending: Surgery | Admitting: Surgery

## 2024-02-13 DIAGNOSIS — Z8249 Family history of ischemic heart disease and other diseases of the circulatory system: Secondary | ICD-10-CM | POA: Insufficient documentation

## 2024-02-13 DIAGNOSIS — E1122 Type 2 diabetes mellitus with diabetic chronic kidney disease: Secondary | ICD-10-CM | POA: Insufficient documentation

## 2024-02-13 DIAGNOSIS — I7025 Atherosclerosis of native arteries of other extremities with ulceration: Secondary | ICD-10-CM

## 2024-02-13 DIAGNOSIS — Z833 Family history of diabetes mellitus: Secondary | ICD-10-CM | POA: Insufficient documentation

## 2024-02-13 DIAGNOSIS — I5022 Chronic systolic (congestive) heart failure: Secondary | ICD-10-CM | POA: Insufficient documentation

## 2024-02-13 DIAGNOSIS — Z794 Long term (current) use of insulin: Secondary | ICD-10-CM | POA: Insufficient documentation

## 2024-02-13 DIAGNOSIS — I70245 Atherosclerosis of native arteries of left leg with ulceration of other part of foot: Secondary | ICD-10-CM

## 2024-02-13 DIAGNOSIS — E11621 Type 2 diabetes mellitus with foot ulcer: Secondary | ICD-10-CM | POA: Insufficient documentation

## 2024-02-13 DIAGNOSIS — Z89421 Acquired absence of other right toe(s): Secondary | ICD-10-CM | POA: Diagnosis not present

## 2024-02-13 DIAGNOSIS — Z95810 Presence of automatic (implantable) cardiac defibrillator: Secondary | ICD-10-CM | POA: Insufficient documentation

## 2024-02-13 DIAGNOSIS — L97529 Non-pressure chronic ulcer of other part of left foot with unspecified severity: Secondary | ICD-10-CM | POA: Insufficient documentation

## 2024-02-13 DIAGNOSIS — Z7984 Long term (current) use of oral hypoglycemic drugs: Secondary | ICD-10-CM | POA: Insufficient documentation

## 2024-02-13 DIAGNOSIS — N184 Chronic kidney disease, stage 4 (severe): Secondary | ICD-10-CM | POA: Diagnosis not present

## 2024-02-13 DIAGNOSIS — E1151 Type 2 diabetes mellitus with diabetic peripheral angiopathy without gangrene: Secondary | ICD-10-CM | POA: Diagnosis present

## 2024-02-13 DIAGNOSIS — I130 Hypertensive heart and chronic kidney disease with heart failure and stage 1 through stage 4 chronic kidney disease, or unspecified chronic kidney disease: Secondary | ICD-10-CM | POA: Diagnosis not present

## 2024-02-13 HISTORY — PX: ABDOMINAL AORTOGRAM W/LOWER EXTREMITY: CATH118223

## 2024-02-13 HISTORY — PX: PERIPHERAL VASCULAR BALLOON ANGIOPLASTY: CATH118281

## 2024-02-13 LAB — POCT I-STAT, CHEM 8
BUN: 41 mg/dL — ABNORMAL HIGH (ref 6–20)
Calcium, Ion: 1.33 mmol/L (ref 1.15–1.40)
Chloride: 115 mmol/L — ABNORMAL HIGH (ref 98–111)
Creatinine, Ser: 2.8 mg/dL — ABNORMAL HIGH (ref 0.61–1.24)
Glucose, Bld: 219 mg/dL — ABNORMAL HIGH (ref 70–99)
HCT: 42 % (ref 39.0–52.0)
Hemoglobin: 14.3 g/dL (ref 13.0–17.0)
Potassium: 4.4 mmol/L (ref 3.5–5.1)
Sodium: 142 mmol/L (ref 135–145)
TCO2: 17 mmol/L — ABNORMAL LOW (ref 22–32)

## 2024-02-13 SURGERY — ABDOMINAL AORTOGRAM W/LOWER EXTREMITY
Anesthesia: LOCAL

## 2024-02-13 MED ORDER — HEPARIN SODIUM (PORCINE) 1000 UNIT/ML IJ SOLN
INTRAMUSCULAR | Status: DC | PRN
  Administered 2024-02-13: 8000 [IU] via INTRAVENOUS

## 2024-02-13 MED ORDER — ASPIRIN 81 MG PO TBEC
81.0000 mg | DELAYED_RELEASE_TABLET | Freq: Every day | ORAL | Status: DC
  Administered 2024-02-13: 81 mg via ORAL
  Filled 2024-02-13: qty 1

## 2024-02-13 MED ORDER — SODIUM CHLORIDE 0.9% FLUSH: 3.0000 mL | Freq: Two times a day (BID) | INTRAVENOUS | Status: DC

## 2024-02-13 MED ORDER — MORPHINE SULFATE (PF) 2 MG/ML IV SOLN: 2.0000 mg | INTRAVENOUS | Status: DC | PRN

## 2024-02-13 MED ORDER — FENTANYL CITRATE (PF) 100 MCG/2ML IJ SOLN
INTRAMUSCULAR | Status: DC | PRN
  Administered 2024-02-13: 50 ug via INTRAVENOUS

## 2024-02-13 MED ORDER — HYDRALAZINE HCL 20 MG/ML IJ SOLN: 5.0000 mg | INTRAMUSCULAR | Status: DC | PRN

## 2024-02-13 MED ORDER — FENTANYL CITRATE (PF) 100 MCG/2ML IJ SOLN
INTRAMUSCULAR | Status: AC
  Filled 2024-02-13: qty 2

## 2024-02-13 MED ORDER — LIDOCAINE HCL (PF) 1 % IJ SOLN
INTRAMUSCULAR | Status: DC | PRN
  Administered 2024-02-13: 15 mL

## 2024-02-13 MED ORDER — IODIXANOL 320 MG/ML IV SOLN
INTRAVENOUS | Status: DC | PRN
  Administered 2024-02-13: 32 mL

## 2024-02-13 MED ORDER — LIDOCAINE HCL (PF) 1 % IJ SOLN
INTRAMUSCULAR | Status: AC
  Filled 2024-02-13: qty 30

## 2024-02-13 MED ORDER — SODIUM CHLORIDE 0.9 % IV SOLN: 250.0000 mL | INTRAVENOUS | Status: DC | PRN

## 2024-02-13 MED ORDER — SODIUM CHLORIDE 0.9 % IV SOLN: INTRAVENOUS | Status: DC

## 2024-02-13 MED ORDER — MIDAZOLAM HCL 2 MG/2ML IJ SOLN
INTRAMUSCULAR | Status: AC
  Filled 2024-02-13: qty 2

## 2024-02-13 MED ORDER — LABETALOL HCL 5 MG/ML IV SOLN: 10.0000 mg | INTRAVENOUS | Status: DC | PRN

## 2024-02-13 MED ORDER — ACETAMINOPHEN 325 MG PO TABS: 650.0000 mg | ORAL_TABLET | ORAL | Status: DC | PRN

## 2024-02-13 MED ORDER — ASPIRIN 81 MG PO TBEC
81.0000 mg | DELAYED_RELEASE_TABLET | Freq: Every day | ORAL | 12 refills | Status: AC
  Filled 2024-02-13: qty 30, 30d supply, fill #0
  Filled 2024-03-13: qty 30, 30d supply, fill #1
  Filled 2024-04-15 – 2024-04-26 (×2): qty 30, 30d supply, fill #2
  Filled 2024-05-23 – 2024-05-24 (×2): qty 30, 30d supply, fill #3
  Filled 2024-06-24: qty 30, 30d supply, fill #4
  Filled 2024-07-24: qty 30, 30d supply, fill #5
  Filled 2024-08-23 – 2024-09-04 (×2): qty 30, 30d supply, fill #6
  Filled 2024-10-07: qty 30, 30d supply, fill #7

## 2024-02-13 MED ORDER — HEPARIN (PORCINE) IN NACL 1000-0.9 UT/500ML-% IV SOLN
INTRAVENOUS | Status: DC | PRN
Start: 2024-02-13 — End: 2024-02-13
  Administered 2024-02-13 (×2): 500 mL

## 2024-02-13 MED ORDER — SODIUM CHLORIDE 0.9% FLUSH: 3.0000 mL | INTRAVENOUS | Status: DC | PRN

## 2024-02-13 MED ORDER — OXYCODONE HCL 5 MG PO TABS: 5.0000 mg | ORAL_TABLET | ORAL | Status: DC | PRN

## 2024-02-13 MED ORDER — MIDAZOLAM HCL 2 MG/2ML IJ SOLN
INTRAMUSCULAR | Status: DC | PRN
  Administered 2024-02-13: 1 mg via INTRAVENOUS

## 2024-02-13 MED ORDER — HEPARIN SODIUM (PORCINE) 1000 UNIT/ML IJ SOLN
INTRAMUSCULAR | Status: AC
  Filled 2024-02-13: qty 10

## 2024-02-13 MED ORDER — SODIUM CHLORIDE 0.9 % WEIGHT BASED INFUSION: 1.0000 mL/kg/h | INTRAVENOUS | Status: DC

## 2024-02-13 SURGICAL SUPPLY — 18 items
BALLN STERLING OTW 3X100X150 (BALLOONS) ×2 IMPLANT
BALLOON STERLING OTW 3X100X150 (BALLOONS) IMPLANT
CATH NAVICROSS ANGLED 90CM (MICROCATHETER) IMPLANT
CATH OMNI FLUSH 5F 65CM (CATHETERS) IMPLANT
DEVICE VASC CLSR CELT ART 5 (Vascular Products) IMPLANT
GUIDEWIRE ANGLED .035X150CM (WIRE) IMPLANT
KIT ANGIASSIST CO2 SYSTEM (KITS) IMPLANT
KIT ENCORE 26 ADVANTAGE (KITS) IMPLANT
KIT MICROPUNCTURE NIT STIFF (SHEATH) IMPLANT
KIT SINGLE USE MANIFOLD (KITS) IMPLANT
SET ATX-X65L (MISCELLANEOUS) IMPLANT
SHEATH DESTINATION MP 5FR 45CM (SHEATH) IMPLANT
SHEATH PINNACLE 5F 10CM (SHEATH) IMPLANT
SHEATH SHUTTLE 5F/110 (SHEATH) IMPLANT
TRAY PV CATH (CUSTOM PROCEDURE TRAY) ×3 IMPLANT
WIRE BENTSON .035X145CM (WIRE) IMPLANT
WIRE G V18X300CM (WIRE) IMPLANT
WIRE HI TORQ VERSACORE 300 (WIRE) IMPLANT

## 2024-02-13 NOTE — Discharge Instructions (Addendum)
 Start Eliquis 3/5 in am

## 2024-02-13 NOTE — Op Note (Signed)
 Patient name: Jeffrey [name] MRN: [id number] DOB: 1966-10-13 Sex: male  02/13/2024 Pre-operative Diagnosis: Left toe ulcer Post-operative diagnosis:  Same Surgeon:  Durene Cal Procedure Performed:  1.  Ultrasound-guided access, right femoral artery  2.  Abdominal gram with CO2  3.  Left leg angiogram  4.  Selective injections with the catheter in the left popliteal artery  5.  Selective injection with cath in the left anterior tibial artery  6.  Angioplasty, left peroneal artery  7.  Angioplasty, left anterior tibial artery  8.  Conscious sedation, 69 minutes  9.  Closure device, Celt   Indications: This is a 58 year old gentleman with a nonhealing left great toe wound and severe peripheral vascular disease who comes in today for further evaluation and possible intervention.  Procedure:  The patient was identified in the holding area and taken to room 8.  The patient was then placed supine on the table and prepped and draped in the usual sterile fashion.  A time out was called.  Conscious sedation was administered with the use of IV fentanyl and Versed under continuous physician and nurse monitoring.  Heart rate, blood pressure, and oxygen saturation were continuously monitored.  Total sedation time was 69 minutes.  Ultrasound was used to evaluate the right common femoral artery.  It was patent .  A digital ultrasound image was acquired.  A micropuncture needle was used to access the right common femoral artery under ultrasound guidance.  An 018 wire was advanced without resistance and a micropuncture sheath was placed.  The 018 wire was removed and a benson wire was placed.  The micropuncture sheath was exchanged for a 5 french sheath.  An omniflush catheter was advanced over the wire to the level of L-1.  An abdominal angiogram with CO2 was obtained.  Next, using the omniflush catheter and a benson wire, the aortic bifurcation was crossed and the catheter was placed into theleft  external iliac artery and left runoff was obtained with CO2 and contrast.    Findings:   Aortogram: No obvious renal artery stenosis was identified.  The infrarenal abdominal aorta was widely patent.  Bilateral common and external iliac arteries were widely patent.  Bilateral common femoral arteries are widely patent.  Right Lower Extremity: Not evaluated  Left Lower Extremity: Left common femoral and profundofemoral artery are widely patent.  The superficial femoral artery is patent throughout its course however there are multiple lesions less than 50%.  The popliteal artery is patent throughout its course.  The dominant runoff is the peroneal artery which has a greater than 80% stenosis at its origin.  The posterior tibial and anterior tibial artery are occluded.  The anterior tibial does come back in the lower leg.  Severe disease out onto the foot  Intervention: After the above images were acquired the decision was made to proceed with intervention.  A 5 French 110 cm sheath was advanced into the left popliteal artery and the patient was fully heparinized.  A V-18 wire was directed into the peroneal artery and peroneal angioplasty was performed with a 3 100 Sterling balloon.  Completion imaging revealed resolution of the stenosis.  After peroneal intervention, a distal anterior tibial was better visualized.  There was a 15 cm occlusion.  I redirected the wire into the peroneal artery and was able to cross the lesion.  The anterior tibial artery was treated with a 3 mm Sterling balloon.  Follow-up injections were performed with the catheter in the  anterior tibial artery which showed this to be widely patent.  A total of 32 cc of contrast were utilized.  I did not feel that it was safe to proceed with any additional treatment because of the patient's renal function.  I ended up closing the groin with a Celt  Impression:  #1  High-grade greater than 80% stenosis in the proximal anterior tibial artery  which is the single-vessel runoff.  This was successfully treated with a 3 mm balloon  #2  15-20 cm segment occlusion of the anterior tibial artery that was able to be crossed and treated with a 3 mm balloon with minimal residual stenosis  #3  Patient is maximally revascularized.  Unfortunately he does have severe disease out onto the foot which may make wound healing challenging.   Juleen China, M.D., [facility] Office: [phone] Pager:  [phone]

## 2024-02-14 ENCOUNTER — Other Ambulatory Visit: Payer: Self-pay

## 2024-02-14 ENCOUNTER — Encounter (HOSPITAL_COMMUNITY): Payer: Self-pay | Admitting: Surgery

## 2024-02-16 ENCOUNTER — Other Ambulatory Visit: Payer: Self-pay

## 2024-02-16 MED ORDER — FUROSEMIDE 40 MG PO TABS
40.0000 mg | ORAL_TABLET | Freq: Two times a day (BID) | ORAL | 2 refills | Status: DC
  Filled 2024-02-16: qty 180, 90d supply, fill #0
  Filled 2024-05-13: qty 180, 90d supply, fill #1
  Filled 2024-08-12: qty 180, 90d supply, fill #2

## 2024-02-19 ENCOUNTER — Other Ambulatory Visit: Payer: Self-pay

## 2024-02-20 ENCOUNTER — Other Ambulatory Visit: Payer: Self-pay

## 2024-02-20 NOTE — Addendum Note (Signed)
 Addended by: Elease Etienne A on: 02/20/2024 01:38 PM   Modules accepted: Orders

## 2024-02-20 NOTE — Progress Notes (Signed)
 Remote ICD transmission.

## 2024-02-23 NOTE — Interval H&P Note (Signed)
 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  The various methods of treatment have been discussed with the patient and family. After consideration of risks, benefits and other options for treatment, the patient has consented to  Procedure(s) with comments: ABDOMINAL AORTOGRAM W/LOWER EXTREMITY (N/A) PERIPHERAL VASCULAR BALLOON ANGIOPLASTY (Left) - Peroneal and AT as a surgical intervention.  The patient's history has been reviewed, patient examined, no change in status, stable for surgery.  I have reviewed the patient's chart and labs.  Questions were answered to the patient's satisfaction.     Durene Cal

## 2024-02-26 ENCOUNTER — Other Ambulatory Visit: Payer: Self-pay

## 2024-02-29 ENCOUNTER — Other Ambulatory Visit: Payer: Self-pay

## 2024-03-06 ENCOUNTER — Other Ambulatory Visit: Payer: Self-pay

## 2024-03-06 DIAGNOSIS — I7025 Atherosclerosis of native arteries of other extremities with ulceration: Secondary | ICD-10-CM

## 2024-03-07 ENCOUNTER — Other Ambulatory Visit: Payer: Self-pay

## 2024-03-08 ENCOUNTER — Other Ambulatory Visit: Payer: Self-pay

## 2024-03-08 MED ORDER — ROSUVASTATIN CALCIUM 40 MG PO TABS
40.0000 mg | ORAL_TABLET | Freq: Every day | ORAL | 3 refills | Status: AC
  Filled 2024-03-08 – 2024-07-16 (×3): qty 90, 90d supply, fill #0
  Filled 2024-10-07 – 2024-11-15 (×2): qty 90, 90d supply, fill #1

## 2024-03-13 ENCOUNTER — Other Ambulatory Visit: Payer: Self-pay

## 2024-03-18 ENCOUNTER — Encounter: Admitting: Surgery

## 2024-03-18 ENCOUNTER — Ambulatory Visit (HOSPITAL_COMMUNITY): Attending: Surgery

## 2024-03-19 ENCOUNTER — Other Ambulatory Visit: Payer: Self-pay

## 2024-03-20 ENCOUNTER — Other Ambulatory Visit: Payer: Self-pay

## 2024-03-21 ENCOUNTER — Other Ambulatory Visit: Payer: Self-pay

## 2024-04-11 ENCOUNTER — Other Ambulatory Visit: Payer: Self-pay

## 2024-04-11 MED ORDER — ROSUVASTATIN CALCIUM 40 MG PO TABS
40.0000 mg | ORAL_TABLET | Freq: Every day | ORAL | 4 refills | Status: DC
  Filled 2024-04-11: qty 90, 90d supply, fill #0

## 2024-04-12 ENCOUNTER — Other Ambulatory Visit: Payer: Self-pay

## 2024-04-15 ENCOUNTER — Other Ambulatory Visit: Payer: Self-pay

## 2024-04-17 ENCOUNTER — Encounter: Payer: Self-pay | Admitting: Internal Medicine

## 2024-04-17 ENCOUNTER — Telehealth: Payer: Self-pay

## 2024-04-17 NOTE — Progress Notes (Signed)
 PERIOPERATIVE PRESCRIPTION FOR IMPLANTED CARDIAC DEVICE PROGRAMMING  Patient Information: Name:  Jeffrey [name]  DOB:  Nov 02, 1966  MRN:  [id number]   Procedure:   CATARACT EXTRACTION OF THE LEFT EYE & XEN STENT   Date of Surgery:  Clearance 04/25/24                                  Surgeon:  DR Halford Levels Surgeon's Group or Practice Name:  [facility] Phone number:  [phone] Fax number:  [phone]  Device Information:  Clinic EP Physician:  Richardo Chandler, MD   Device Type:  Defibrillator Manufacturer and Phone #:  Medtronic: [phone] Pacemaker Dependent?:  No. Date of Last Device Check:  08/01/23 in office Normal Device Function?:  Yes.    Electrophysiologist's Recommendations:  Have magnet available. Provide continuous ECG monitoring when magnet is used or reprogramming is to be performed.  Procedure may interfere with device function.  Magnet should be placed over device during procedure.  Per Device Clinic Standing Orders, [name] Cerise, RN  4:21 PM 04/17/2024

## 2024-04-17 NOTE — Telephone Encounter (Signed)
   Pre-operative Risk Assessment    Patient Name: Jeffrey [name]  DOB: Jan 30, 1966 MRN: [id number]   Date of last office visit: 08/01/23 Pilar Bridge, PA-C Date of next office visit: NONE   Request for Surgical Clearance    Procedure:   CATARACT EXTRACTION OF THE LEFT EYE & XEN STENT  Date of Surgery:  Clearance 04/25/24                                Surgeon:  DR Halford Levels Surgeon's Group or Practice Name:  [facility] Phone number:  [phone] Fax number:  [phone]   Type of Clearance Requested:   - Medical  - Pharmacy:  Hold Aspirin  and Apixaban  (Eliquis )     Type of Anesthesia:  MAC   Additional requests/questions:    Signed, Collin Deal   04/17/2024, 3:41 PM

## 2024-04-17 NOTE — Telephone Encounter (Signed)
 I called [facility] and stated that the pt is going to need an in office appt and that I did not have any appts open before his procedure. Surgery scheduler states the procedure is urgent. Pt is seen by our EP team. I d/w with the EP schedulers and  they have an opening 04/22/24 and will call the pt with the appt.   I called Hunter with [facility] back and informed her that the EP schedulers will call him with an appt. Arlene Ben said thank you for the help.

## 2024-04-17 NOTE — Telephone Encounter (Signed)
   Name: Jeffrey [name]  DOB: February 27, 1966  MRN: [id number]  Primary Cardiologist: Ahmad Alert, MD  Chart reviewed as part of pre-operative protocol coverage. Because of Jeffrey [name]'s past medical history and time since last visit, he will require a follow-up in-office visit in order to better assess preoperative cardiovascular risk.  Pre-op  covering staff: - Please schedule appointment and call patient to inform them. If patient already had an upcoming appointment within acceptable timeframe, please add "pre-op  clearance" to the appointment notes so provider is aware. - Please contact requesting surgeon's office via preferred method (i.e, phone, fax) to inform them of need for appointment prior to surgery.  Eliquis  has not been prescribed by cardiology, recommendations regarding holding of Eliquis  will need to come from primary provider (PCP).  Patient is currently on aspirin  81 mg daily per [facility] given recent intervention, recommendations regarding holding of aspirin  will need to come from [facility].  [name], NP  04/17/2024, 3:57 PM

## 2024-04-17 NOTE — Telephone Encounter (Signed)
 Noted, device clearance given, sent through [facility] fax.

## 2024-04-17 NOTE — Telephone Encounter (Signed)
I will update all parties involved.  

## 2024-04-18 ENCOUNTER — Other Ambulatory Visit: Payer: Self-pay

## 2024-04-19 ENCOUNTER — Ambulatory Visit (INDEPENDENT_AMBULATORY_CARE_PROVIDER_SITE_OTHER): Payer: 59

## 2024-04-19 DIAGNOSIS — I428 Other cardiomyopathies: Secondary | ICD-10-CM | POA: Diagnosis not present

## 2024-04-19 NOTE — Telephone Encounter (Signed)
 You are very welcome Dr. Candi Chafe.

## 2024-04-22 ENCOUNTER — Other Ambulatory Visit: Payer: Self-pay

## 2024-04-22 ENCOUNTER — Ambulatory Visit: Attending: Student | Admitting: Student

## 2024-04-22 ENCOUNTER — Encounter: Payer: Self-pay | Admitting: Student

## 2024-04-22 VITALS — BP 126/78 | HR 97 | Ht 67.0 in | Wt 168.8 lb

## 2024-04-22 DIAGNOSIS — I7025 Atherosclerosis of native arteries of other extremities with ulceration: Secondary | ICD-10-CM

## 2024-04-22 DIAGNOSIS — I5022 Chronic systolic (congestive) heart failure: Secondary | ICD-10-CM

## 2024-04-22 DIAGNOSIS — I10 Essential (primary) hypertension: Secondary | ICD-10-CM | POA: Diagnosis not present

## 2024-04-22 DIAGNOSIS — I428 Other cardiomyopathies: Secondary | ICD-10-CM

## 2024-04-22 LAB — CUP PACEART INCLINIC DEVICE CHECK
Battery Remaining Longevity: 50 mo
Battery Voltage: 2.97 V
Brady Statistic RV Percent Paced: 0.01 %
Date Time Interrogation Session: 20250512120935
HighPow Impedance: 58 Ohm
Implantable Lead Connection Status: 753985
Implantable Lead Implant Date: 20190204
Implantable Lead Location: 753860
Implantable Pulse Generator Implant Date: 20190204
Lead Channel Impedance Value: 342 Ohm
Lead Channel Impedance Value: 399 Ohm
Lead Channel Pacing Threshold Amplitude: 1 V
Lead Channel Pacing Threshold Pulse Width: 0.4 ms
Lead Channel Sensing Intrinsic Amplitude: 12.375 mV
Lead Channel Sensing Intrinsic Amplitude: 9.75 mV
Lead Channel Setting Pacing Amplitude: 2.5 V
Lead Channel Setting Pacing Pulse Width: 0.4 ms
Lead Channel Setting Sensing Sensitivity: 0.3 mV
Zone Setting Status: 755011
Zone Setting Status: 755011

## 2024-04-22 MED ORDER — LOSARTAN POTASSIUM 25 MG PO TABS
25.0000 mg | ORAL_TABLET | Freq: Two times a day (BID) | ORAL | 3 refills | Status: AC
  Filled 2024-04-22: qty 180, 90d supply, fill #0

## 2024-04-22 NOTE — Patient Instructions (Signed)
 Medication Instructions:  Your physician recommends that you continue on your current medications as directed. Please refer to the Current Medication list given to you today.  *If you need a refill on your cardiac medications before your next appointment, please call your pharmacy*  Lab Work: None ordered If you have labs (blood work) drawn today and your tests are completely normal, you will receive your results only by: MyChart Message (if you have MyChart) OR A paper copy in the mail If you have any lab test that is abnormal or we need to change your treatment, we will call you to review the results.  Follow-Up: At [facility], you and your health needs are our priority.  As part of our continuing mission to provide you with exceptional heart care, our providers are all part of one team.  This team includes your primary Cardiologist (physician) and Advanced Practice Providers or APPs (Physician Assistants and Nurse Practitioners) who all work together to provide you with the care you need, when you need it.  Your next appointment:   6 month(s)  Provider:   Joycelyn Noa" Percell Boyers, PA-C    We recommend signing up for the patient portal called "MyChart".  Sign up information is provided on this After Visit Summary.  MyChart is used to connect with patients for Virtual Visits (Telemedicine).  Patients are able to view lab/test results, encounter notes, upcoming appointments, etc.  Non-urgent messages can be sent to your provider as well.   To learn more about what you can do with MyChart, go to [email or web address].

## 2024-04-22 NOTE — Progress Notes (Signed)
  Cardiology Office Note:   Date:  04/22/2024  ID:  Jeffrey [name], DOB Apr 26, 1966, MRN [id number]  Primary Cardiologist: Ahmad Alert, MD Electrophysiologist: Richardo Chandler, MD   History of Present Illness:   Jeffrey [name] is a 58 y.o. male with h/o HTN, CKD III, NICM, and syncope seen today for cardiac clearance for Cataract extraction of the Left eye and Xen Stent   Since last being seen in our clinic the patient reports doing OK from a cardiac perspective. Walking a little everyday. Able to get from car to store without breaks, but very slow. Otherwise, he denies chest pain, palpitations, dyspnea, PND, orthopnea, nausea, vomiting, dizziness, syncope, edema, weight gain, or early satiety.  Review of systems complete and found to be negative unless listed in HPI.    EP Information / Studies Reviewed:    EKG is ordered today. Personal review as below.  EKG Interpretation Date/Time:  Monday Apr 22 2024 10:30:18 EDT Ventricular Rate:  97 PR Interval:  188 QRS Duration:  76 QT Interval:  354 QTC Calculation: 449 R Axis:   -13  Text Interpretation: Normal sinus rhythm Minimal voltage criteria for LVH, may be normal variant ( R in aVL ) Inferior infarct , age undetermined Possible Anterior infarct , age undetermined When compared with ECG of 20-Aug-2023 11:57, Sinus rhythm has replaced Atrial fibrillation Questionable change in QRS duration Borderline criteria for Anterior infarct are now Present Inferior infarct is now Present Confirmed by Pilar Bridge [phone]) on 04/22/2024 10:40:04 AM    Arrhythmia/Device History Medtronic Single Chamber ICD 01/2018 for NICM and syncope  Barostim (Standard) implanted 11/26/2021    Barostim Interrogation- Performed personally and reviewed in detail today,  See scanned report  ICD/PPM interrogation - Performed personally and reviewed in detail today.    Physical Exam:   VS:  BP 126/78   Pulse 97   Ht 5\' 7"  (1.702 m)   Wt 168 lb 12.8 oz  (76.6 kg)   SpO2 96%   BMI 26.44 kg/m    Wt Readings from Last 3 Encounters:  04/22/24 168 lb 12.8 oz (76.6 kg)  02/13/24 166 lb (75.3 kg)  02/05/24 163 lb (73.9 kg)     GEN: Well nourished, well developed in no acute distress NECK: No JVD; No carotid bruits CARDIAC: Regular rate and rhythm, no murmurs, rubs, gallops RESPIRATORY:  Clear to auscultation without rales, wheezing or rhonchi  ABDOMEN: Soft, non-tender, non-distended EXTREMITIES:  No edema; No deformity   ASSESSMENT AND PLAN:    Chronic systolic CHF s/p Medtronic Defibrillator and Barostim implantation NYHA III symptoms.   Device programmed at 8.4 @ 125 us  for chronic settings  Device impedence stable. Battery stable to 11/2025 Pt goals are to continue to improve functional status.   Normal device function See scanned report.  Cardiac Clearance for cataract removal of left eye  No concerns from a cardiac perspective.  The patient is at low risk to proceed without further work up given most recent EF was normal.  If the patient has new chest pain or SOB prior to surgery, they should be revaluated.  Eliquis  and ASA are per PCP and Vascular.    Disposition:   Follow up with EP APP in in 6 months  Signed, Tylene Galla, PA-C

## 2024-04-24 ENCOUNTER — Other Ambulatory Visit: Payer: Self-pay

## 2024-04-24 LAB — CUP PACEART REMOTE DEVICE CHECK
Battery Remaining Longevity: 50 mo
Battery Voltage: 2.97 V
Brady Statistic RV Percent Paced: 0 %
Date Time Interrogation Session: 20250514091549
HighPow Impedance: 53 Ohm
Implantable Lead Connection Status: 753985
Implantable Lead Implant Date: 20190204
Implantable Lead Location: 753860
Implantable Pulse Generator Implant Date: 20190204
Lead Channel Impedance Value: 323 Ohm
Lead Channel Impedance Value: 380 Ohm
Lead Channel Pacing Threshold Amplitude: 1 V
Lead Channel Pacing Threshold Pulse Width: 0.4 ms
Lead Channel Sensing Intrinsic Amplitude: 13.125 mV
Lead Channel Sensing Intrinsic Amplitude: 13.125 mV
Lead Channel Setting Pacing Amplitude: 2.5 V
Lead Channel Setting Pacing Pulse Width: 0.4 ms
Lead Channel Setting Sensing Sensitivity: 0.3 mV
Zone Setting Status: 755011
Zone Setting Status: 755011

## 2024-04-25 ENCOUNTER — Other Ambulatory Visit: Payer: Self-pay

## 2024-04-26 ENCOUNTER — Other Ambulatory Visit: Payer: Self-pay

## 2024-04-26 MED ORDER — LOSARTAN POTASSIUM 50 MG PO TABS
50.0000 mg | ORAL_TABLET | Freq: Every day | ORAL | 3 refills | Status: AC
  Filled 2024-04-26: qty 90, 90d supply, fill #0
  Filled 2024-07-23: qty 90, 90d supply, fill #1
  Filled 2024-10-21 – 2024-11-15 (×2): qty 90, 90d supply, fill #2

## 2024-04-30 ENCOUNTER — Other Ambulatory Visit: Payer: Self-pay

## 2024-05-01 ENCOUNTER — Other Ambulatory Visit: Payer: Self-pay

## 2024-05-01 MED ORDER — APIXABAN 5 MG PO TABS
5.0000 mg | ORAL_TABLET | Freq: Two times a day (BID) | ORAL | 3 refills | Status: AC
  Filled 2024-05-01 – 2024-05-15 (×2): qty 180, 90d supply, fill #0
  Filled 2024-08-08: qty 180, 90d supply, fill #1
  Filled 2024-11-06: qty 180, 90d supply, fill #2

## 2024-05-08 ENCOUNTER — Ambulatory Visit: Payer: Self-pay | Admitting: Cardiology

## 2024-05-08 ENCOUNTER — Ambulatory Visit: Payer: Self-pay | Admitting: Cardiovascular Disease

## 2024-05-13 ENCOUNTER — Other Ambulatory Visit: Payer: Self-pay

## 2024-05-14 ENCOUNTER — Other Ambulatory Visit: Payer: Self-pay

## 2024-05-15 ENCOUNTER — Other Ambulatory Visit: Payer: Self-pay

## 2024-05-16 ENCOUNTER — Other Ambulatory Visit: Payer: Self-pay

## 2024-05-17 ENCOUNTER — Other Ambulatory Visit: Payer: Self-pay

## 2024-05-23 ENCOUNTER — Other Ambulatory Visit: Payer: Self-pay

## 2024-05-24 ENCOUNTER — Other Ambulatory Visit: Payer: Self-pay

## 2024-05-24 NOTE — Addendum Note (Signed)
 Addended by: Lott Rouleau A on: 05/24/2024 10:51 AM   Modules accepted: Orders

## 2024-05-24 NOTE — Progress Notes (Signed)
 Remote ICD transmission.

## 2024-05-29 ENCOUNTER — Other Ambulatory Visit: Payer: Self-pay

## 2024-06-24 ENCOUNTER — Other Ambulatory Visit: Payer: Self-pay

## 2024-07-01 ENCOUNTER — Other Ambulatory Visit: Payer: Self-pay

## 2024-07-01 MED ORDER — GABAPENTIN 300 MG PO CAPS
600.0000 mg | ORAL_CAPSULE | Freq: Every day | ORAL | 3 refills | Status: AC
  Filled 2024-07-01 – 2024-11-25 (×2): qty 180, 90d supply, fill #0

## 2024-07-02 ENCOUNTER — Other Ambulatory Visit: Payer: Self-pay

## 2024-07-04 ENCOUNTER — Other Ambulatory Visit: Payer: Self-pay

## 2024-07-10 ENCOUNTER — Other Ambulatory Visit: Payer: Self-pay

## 2024-07-11 ENCOUNTER — Other Ambulatory Visit: Payer: Self-pay

## 2024-07-12 ENCOUNTER — Other Ambulatory Visit: Payer: Self-pay

## 2024-07-15 ENCOUNTER — Other Ambulatory Visit: Payer: Self-pay

## 2024-07-16 ENCOUNTER — Other Ambulatory Visit: Payer: Self-pay

## 2024-07-19 ENCOUNTER — Ambulatory Visit

## 2024-07-19 ENCOUNTER — Other Ambulatory Visit: Payer: Self-pay

## 2024-07-19 DIAGNOSIS — I428 Other cardiomyopathies: Secondary | ICD-10-CM

## 2024-07-19 MED ORDER — JARDIANCE 10 MG PO TABS
10.0000 mg | ORAL_TABLET | Freq: Every day | ORAL | 3 refills | Status: AC
  Filled 2024-07-19: qty 90, 90d supply, fill #0
  Filled 2024-10-16: qty 90, 90d supply, fill #1

## 2024-07-22 LAB — CUP PACEART REMOTE DEVICE CHECK
Battery Remaining Longevity: 46 mo
Battery Voltage: 2.96 V
Brady Statistic RV Percent Paced: 0.01 %
Date Time Interrogation Session: 20250808093426
HighPow Impedance: 56 Ohm
Implantable Lead Connection Status: 753985
Implantable Lead Implant Date: 20190204
Implantable Lead Location: 753860
Implantable Pulse Generator Implant Date: 20190204
Lead Channel Impedance Value: 285 Ohm
Lead Channel Impedance Value: 342 Ohm
Lead Channel Pacing Threshold Amplitude: 1 V
Lead Channel Pacing Threshold Pulse Width: 0.4 ms
Lead Channel Sensing Intrinsic Amplitude: 9.5 mV
Lead Channel Sensing Intrinsic Amplitude: 9.5 mV
Lead Channel Setting Pacing Amplitude: 2.5 V
Lead Channel Setting Pacing Pulse Width: 0.4 ms
Lead Channel Setting Sensing Sensitivity: 0.3 mV
Zone Setting Status: 755011
Zone Setting Status: 755011

## 2024-07-24 ENCOUNTER — Other Ambulatory Visit: Payer: Self-pay

## 2024-07-25 ENCOUNTER — Other Ambulatory Visit: Payer: Self-pay

## 2024-08-07 ENCOUNTER — Other Ambulatory Visit: Payer: Self-pay | Admitting: Family Medicine

## 2024-08-07 ENCOUNTER — Other Ambulatory Visit: Payer: Self-pay

## 2024-08-07 DIAGNOSIS — I110 Hypertensive heart disease with heart failure: Secondary | ICD-10-CM

## 2024-08-07 DIAGNOSIS — E1122 Type 2 diabetes mellitus with diabetic chronic kidney disease: Secondary | ICD-10-CM

## 2024-08-13 ENCOUNTER — Other Ambulatory Visit: Payer: Self-pay

## 2024-08-15 ENCOUNTER — Other Ambulatory Visit: Payer: Self-pay

## 2024-08-21 ENCOUNTER — Ambulatory Visit
Admission: RE | Admit: 2024-08-21 | Discharge: 2024-08-21 | Disposition: A | Discharge status: Home / Self Care | Source: Ambulatory Visit | Attending: Nurse Practitioner | Admitting: Nurse Practitioner

## 2024-08-21 ENCOUNTER — Other Ambulatory Visit: Payer: Self-pay | Admitting: Nurse Practitioner

## 2024-08-21 DIAGNOSIS — M79641 Pain in right hand: Secondary | ICD-10-CM

## 2024-08-23 ENCOUNTER — Other Ambulatory Visit: Payer: Self-pay

## 2024-08-29 ENCOUNTER — Other Ambulatory Visit: Payer: Self-pay

## 2024-09-03 ENCOUNTER — Other Ambulatory Visit: Payer: Self-pay

## 2024-09-04 ENCOUNTER — Other Ambulatory Visit: Payer: Self-pay

## 2024-09-05 ENCOUNTER — Other Ambulatory Visit: Payer: Self-pay

## 2024-09-06 NOTE — Progress Notes (Signed)
Remote ICD Transmission.

## 2024-09-12 ENCOUNTER — Other Ambulatory Visit: Payer: Self-pay

## 2024-10-07 ENCOUNTER — Other Ambulatory Visit: Payer: Self-pay

## 2024-10-16 ENCOUNTER — Other Ambulatory Visit: Payer: Self-pay

## 2024-10-18 ENCOUNTER — Ambulatory Visit

## 2024-10-18 DIAGNOSIS — I428 Other cardiomyopathies: Secondary | ICD-10-CM

## 2024-10-20 ENCOUNTER — Ambulatory Visit: Payer: Self-pay | Admitting: Student in an Organized Health Care Education/Training Program

## 2024-10-20 LAB — CUP PACEART REMOTE DEVICE CHECK
Battery Remaining Longevity: 44 mo
Battery Voltage: 2.97 V
Brady Statistic RV Percent Paced: 0.01 %
Date Time Interrogation Session: 20251107012305
HighPow Impedance: 56 Ohm
Implantable Lead Connection Status: 753985
Implantable Lead Implant Date: 20190204
Implantable Lead Location: 753860
Implantable Pulse Generator Implant Date: 20190204
Lead Channel Impedance Value: 285 Ohm
Lead Channel Impedance Value: 380 Ohm
Lead Channel Pacing Threshold Amplitude: 1 V
Lead Channel Pacing Threshold Pulse Width: 0.4 ms
Lead Channel Sensing Intrinsic Amplitude: 8.375 mV
Lead Channel Sensing Intrinsic Amplitude: 8.375 mV
Lead Channel Setting Pacing Amplitude: 2.5 V
Lead Channel Setting Pacing Pulse Width: 0.4 ms
Lead Channel Setting Sensing Sensitivity: 0.3 mV
Zone Setting Status: 755011
Zone Setting Status: 755011

## 2024-10-23 NOTE — Progress Notes (Signed)
 Remote ICD Transmission

## 2024-10-24 ENCOUNTER — Other Ambulatory Visit: Payer: Self-pay

## 2024-10-25 ENCOUNTER — Other Ambulatory Visit: Payer: Self-pay

## 2024-10-28 ENCOUNTER — Other Ambulatory Visit: Payer: Self-pay

## 2024-10-28 MED ORDER — INSULIN GLARGINE 100 UNIT/ML SOLOSTAR PEN
12.0000 [IU] | PEN_INJECTOR | Freq: Every morning | SUBCUTANEOUS | 4 refills | Status: AC
  Filled 2024-10-28 – 2024-11-07 (×2): qty 12, 85d supply, fill #0

## 2024-10-30 ENCOUNTER — Other Ambulatory Visit: Payer: Self-pay

## 2024-11-05 ENCOUNTER — Other Ambulatory Visit: Payer: Self-pay

## 2024-11-06 ENCOUNTER — Other Ambulatory Visit: Payer: Self-pay

## 2024-11-08 ENCOUNTER — Other Ambulatory Visit: Payer: Self-pay

## 2024-11-10 MED ORDER — FUROSEMIDE 40 MG PO TABS
40.0000 mg | ORAL_TABLET | Freq: Two times a day (BID) | ORAL | 3 refills | Status: AC
  Filled 2024-11-10 – 2024-11-22 (×2): qty 180, 90d supply, fill #0

## 2024-11-11 ENCOUNTER — Other Ambulatory Visit: Payer: Self-pay

## 2024-11-15 ENCOUNTER — Other Ambulatory Visit: Payer: Self-pay

## 2024-11-18 ENCOUNTER — Other Ambulatory Visit: Payer: Self-pay

## 2024-11-19 ENCOUNTER — Other Ambulatory Visit: Payer: Self-pay

## 2024-11-21 ENCOUNTER — Other Ambulatory Visit: Payer: Self-pay

## 2024-11-22 ENCOUNTER — Other Ambulatory Visit: Payer: Self-pay

## 2024-11-25 ENCOUNTER — Other Ambulatory Visit: Payer: Self-pay

## 2024-11-25 MED ORDER — POTASSIUM CHLORIDE ER 10 MEQ PO CPCR
20.0000 meq | ORAL_CAPSULE | Freq: Every day | ORAL | 3 refills | Status: AC
  Filled 2024-11-25: qty 180, 90d supply, fill #0

## 2024-11-27 ENCOUNTER — Other Ambulatory Visit: Payer: Self-pay

## 2024-12-04 ENCOUNTER — Other Ambulatory Visit: Payer: Self-pay

## 2024-12-04 MED ORDER — HYDRALAZINE HCL 50 MG PO TABS: 50.0000 mg | ORAL_TABLET | Freq: Three times a day (TID) | ORAL | 4 refills | Status: AC

## 2024-12-08 ENCOUNTER — Emergency Department (HOSPITAL_COMMUNITY)
Admission: EM | Admit: 2024-12-08 | Discharge: 2024-12-09 | Disposition: A | Attending: Emergency Medicine | Admitting: Emergency Medicine

## 2024-12-08 ENCOUNTER — Encounter (HOSPITAL_COMMUNITY): Payer: Self-pay

## 2024-12-08 ENCOUNTER — Emergency Department (HOSPITAL_COMMUNITY)

## 2024-12-08 ENCOUNTER — Other Ambulatory Visit: Payer: Self-pay

## 2024-12-08 DIAGNOSIS — R531 Weakness: Secondary | ICD-10-CM | POA: Diagnosis present

## 2024-12-08 DIAGNOSIS — B349 Viral infection, unspecified: Secondary | ICD-10-CM | POA: Diagnosis not present

## 2024-12-08 DIAGNOSIS — Z79899 Other long term (current) drug therapy: Secondary | ICD-10-CM | POA: Diagnosis not present

## 2024-12-08 DIAGNOSIS — R059 Cough, unspecified: Secondary | ICD-10-CM | POA: Diagnosis not present

## 2024-12-08 DIAGNOSIS — I129 Hypertensive chronic kidney disease with stage 1 through stage 4 chronic kidney disease, or unspecified chronic kidney disease: Secondary | ICD-10-CM | POA: Diagnosis not present

## 2024-12-08 DIAGNOSIS — Z7982 Long term (current) use of aspirin: Secondary | ICD-10-CM | POA: Insufficient documentation

## 2024-12-08 DIAGNOSIS — R000 Tachycardia, unspecified: Secondary | ICD-10-CM | POA: Diagnosis not present

## 2024-12-08 DIAGNOSIS — E1122 Type 2 diabetes mellitus with diabetic chronic kidney disease: Secondary | ICD-10-CM | POA: Insufficient documentation

## 2024-12-08 DIAGNOSIS — R509 Fever, unspecified: Secondary | ICD-10-CM | POA: Diagnosis not present

## 2024-12-08 DIAGNOSIS — E860 Dehydration: Secondary | ICD-10-CM | POA: Diagnosis not present

## 2024-12-08 DIAGNOSIS — N183 Chronic kidney disease, stage 3 unspecified: Secondary | ICD-10-CM | POA: Diagnosis not present

## 2024-12-08 DIAGNOSIS — Z7901 Long term (current) use of anticoagulants: Secondary | ICD-10-CM | POA: Insufficient documentation

## 2024-12-08 LAB — COMPREHENSIVE METABOLIC PANEL WITH GFR
ALT: 17 U/L (ref 0–44)
AST: 17 U/L (ref 15–41)
Albumin: 4 g/dL (ref 3.5–5.0)
Alkaline Phosphatase: 83 U/L (ref 38–126)
Anion gap: 14 (ref 5–15)
BUN: 41 mg/dL — ABNORMAL HIGH (ref 6–20)
CO2: 16 mmol/L — ABNORMAL LOW (ref 22–32)
Calcium: 9.7 mg/dL (ref 8.9–10.3)
Chloride: 106 mmol/L (ref 98–111)
Creatinine, Ser: 2.67 mg/dL — ABNORMAL HIGH (ref 0.61–1.24)
GFR, Estimated: 27 mL/min — ABNORMAL LOW
Glucose, Bld: 280 mg/dL — ABNORMAL HIGH (ref 70–99)
Potassium: 4.2 mmol/L (ref 3.5–5.1)
Sodium: 136 mmol/L (ref 135–145)
Total Bilirubin: 0.6 mg/dL (ref 0.0–1.2)
Total Protein: 7.7 g/dL (ref 6.5–8.1)

## 2024-12-08 LAB — CBC WITH DIFFERENTIAL/PLATELET
Abs Immature Granulocytes: 0.01 K/uL (ref 0.00–0.07)
Basophils Absolute: 0 K/uL (ref 0.0–0.1)
Basophils Relative: 0 %
Eosinophils Absolute: 0 K/uL (ref 0.0–0.5)
Eosinophils Relative: 1 %
HCT: 47 % (ref 39.0–52.0)
Hemoglobin: 13.3 g/dL (ref 13.0–17.0)
Immature Granulocytes: 0 %
Lymphocytes Relative: 9 %
Lymphs Abs: 0.4 K/uL — ABNORMAL LOW (ref 0.7–4.0)
MCH: 25 pg — ABNORMAL LOW (ref 26.0–34.0)
MCHC: 28.3 g/dL — ABNORMAL LOW (ref 30.0–36.0)
MCV: 88.3 fL (ref 80.0–100.0)
Monocytes Absolute: 0.4 K/uL (ref 0.1–1.0)
Monocytes Relative: 7 %
Neutro Abs: 4.1 K/uL (ref 1.7–7.7)
Neutrophils Relative %: 83 %
Platelets: 185 K/uL (ref 150–400)
RBC: 5.32 MIL/uL (ref 4.22–5.81)
RDW: 14.6 % (ref 11.5–15.5)
WBC: 4.9 K/uL (ref 4.0–10.5)
nRBC: 0 % (ref 0.0–0.2)

## 2024-12-08 LAB — RESP PANEL BY RT-PCR (RSV, FLU A&B, COVID)  RVPGX2
Influenza A by PCR: NEGATIVE
Influenza B by PCR: NEGATIVE
Resp Syncytial Virus by PCR: NEGATIVE
SARS Coronavirus 2 by RT PCR: NEGATIVE

## 2024-12-08 LAB — MAGNESIUM: Magnesium: 2.2 mg/dL (ref 1.7–2.4)

## 2024-12-08 LAB — CBG MONITORING, ED: Glucose-Capillary: 318 mg/dL — ABNORMAL HIGH (ref 70–99)

## 2024-12-08 MED ORDER — SODIUM CHLORIDE 0.9 % IV BOLUS: 1000.0000 mL | Freq: Once | INTRAVENOUS | Status: DC

## 2024-12-08 NOTE — ED Triage Notes (Signed)
 Arrives [facility] from home with nausea, vomiting and suspected fevers.   Says he was around family for christmas and thinks he may have caught something.

## 2024-12-08 NOTE — ED Provider Triage Note (Signed)
 Emergency Medicine Provider Triage Evaluation Note  Jeffrey [name] , a 58 y.o. male  was evaluated in triage.  Pt complains of generalized malaise and fatigue, nausea, vomiting, fevers.  He does note that he was around his mother individuals who are sick at Christmas and is unsure if he may have caught something.  Review of Systems  Positive: Generalized malaise fatigue, nausea, vomiting, fevers Negative: Abdominal pain, cough, congestion, rhinorrhea, sore throat  Physical Exam  BP (!) 144/93 (BP Location: Left Arm)   Pulse (!) 107   Temp 100 F (37.8 C) (Oral)   Resp 16   Ht 5' 7 (1.702 m)   Wt 78.9 kg   SpO2 98%   BMI 27.25 kg/m  Gen:   Awake, no distress   Resp:  Normal effort  MSK:   Moves extremities without difficulty  Other:    Medical Decision Making  Medically screening exam initiated at 8:44 PM.  Appropriate orders placed.  Jeffrey [name] was informed that the remainder of the evaluation will be completed by another provider, this initial triage assessment does not replace that evaluation, and the importance of remaining in the ED until their evaluation is complete.  Patient does remain stable at this time.  Labs, imaging and medications have been ordered.  Awaiting bed in the back at this time.   Daralene Lonni BIRCH, PA-C 12/08/24 2045

## 2024-12-09 LAB — I-STAT CG4 LACTIC ACID, ED: Lactic Acid, Venous: 1.4 mmol/L (ref 0.5–1.9)

## 2024-12-09 MED ORDER — METOCLOPRAMIDE HCL 5 MG/ML IJ SOLN
10.0000 mg | Freq: Once | INTRAMUSCULAR | Status: AC
  Administered 2024-12-09: 10 mg via INTRAVENOUS
  Filled 2024-12-09: qty 2

## 2024-12-09 MED ORDER — INSULIN GLARGINE 100 UNIT/ML ~~LOC~~ SOLN
12.0000 [IU] | Freq: Once | SUBCUTANEOUS | Status: DC
  Filled 2024-12-09: qty 0.12

## 2024-12-09 MED ORDER — ACETAMINOPHEN 500 MG PO TABS
1000.0000 mg | ORAL_TABLET | Freq: Once | ORAL | Status: AC
  Administered 2024-12-09: 1000 mg via ORAL
  Filled 2024-12-09: qty 2

## 2024-12-09 MED ORDER — LACTATED RINGERS IV BOLUS
500.0000 mL | Freq: Once | INTRAVENOUS | Status: AC
  Administered 2024-12-09: 500 mL via INTRAVENOUS

## 2024-12-09 MED ORDER — INSULIN GLARGINE-YFGN 100 UNIT/ML ~~LOC~~ SOLN: 12.0000 [IU] | Freq: Once | SUBCUTANEOUS | Status: DC

## 2024-12-09 NOTE — Discharge Instructions (Addendum)
 Thankfully today your labs look okay.  Your kidneys are the same and your electrolytes looked okay.  You most likely picked up a bug from Christmas and you are little bit dehydrated.  Continue all your current medications you did receive your insulin  here so you do not need another dose till tomorrow but you need to take the rest of your medications.

## 2024-12-09 NOTE — ED Provider Notes (Signed)
 " [place] EMERGENCY DEPARTMENT AT [facility] Provider Note   CSN: [id number] Arrival date & time: 12/08/24  1954     Patient presents with: Weakness   Jeffrey [name] is a 58 y.o. male.   Pt is a 58y/o male with hx of h/o HTN, CKD III, DM, NICM with last EF of 50-55% in 2023, and syncope who is presenting today with a 2-day history of low-grade fever, nausea, vomiting, 1 episode of diarrhea and general malaise.  Patient has not had anything to eat in approximately 24 hours and at home was becoming more and more weak.  He already has mobility issues and takes a very long time to walk but he was so weak yesterday he tried to get up and collapsed to the floor.  He denies hitting his head or loss of consciousness.  His significant other reports in the last month he has had more frequent falls but yesterday was so weak he could not even get up.  He denies any abdominal pain and still complains of some nausea.  He has had a nonproductive cough for the last 24 hours but denies significant shortness of breath.  Last took his home medication yesterday.  He denies headache or vision changes.  No sore throat.  He denies any urinary symptoms.  Does report being around several individuals who are sick at Christmas.  The history is provided by the patient and medical records.  Weakness      Prior to Admission medications  Medication Sig Start Date End Date Taking? Authorizing Provider  Accu-Chek Softclix Lancets lancets Use to check blood sugar three times daily E11.65 08/12/21   Delbert Clam, MD  Accu-Chek Softclix Lancets lancets use lancet to check blood glucose once daily 08/19/22     acetaminophen  (TYLENOL ) 500 MG tablet Take 1,000 mg by mouth every 6 (six) hours as needed for moderate pain.    [provider]  acetaZOLAMIDE  ER (DIAMOX ) 500 MG capsule Take 1 capsule (500 mg total) by mouth daily. 06/07/23   Tobie Baptist, MD  albuterol  (VENTOLIN  HFA) 108 [phone] Base) MCG/ACT  inhaler Inhale 1 to 2 puffs by mouth into the lungs every 6 hours as needed for wheezing or shortness of breath. 10/07/21   Danton Jon HERO, PA-C  apixaban  (ELIQUIS ) 5 MG TABS tablet Take 1 tablet (5 mg total) by mouth 2 (two) times daily for stroke prophylaxis 05/01/24     aspirin  EC 81 MG tablet Take 1 tablet (81 mg total) by mouth daily. Swallow whole. 02/13/24   Serene Gaile ORN, MD  Blood Glucose Monitoring Suppl (ACCU-CHEK GUIDE) w/Device KIT Use to check blood sugar three times daily E11.65 08/12/21   Newlin, Enobong, MD  Blood Glucose Monitoring Suppl (ACCU-CHEK GUIDE) w/Device KIT use kit to check blod glucose 4-5 daily 08/19/22     brimonidine  (ALPHAGAN  P) 0.1 % SOLN Place 1 drop into the left eye 2 (two) times daily. 08/18/22   Patel, Narendra, MD  brimonidine  (ALPHAGAN ) 0.2 % ophthalmic solution Place 1 drop into the right eye 3 (three) times daily.    [provider]  carvedilol  (COREG ) 12.5 MG tablet Take 1 tablet (12.5 mg total) by mouth 2 (two) times daily with a meal. 09/14/23     cetirizine  (ZYRTEC ) 10 MG tablet TAKE 1 TABLET (10 MG TOTAL) BY MOUTH DAILY. Patient taking differently: Take 10 mg by mouth daily as needed for allergies. 07/27/20   Newlin, Enobong, MD  Cholecalciferol  25 MCG (1000  UT) tablet Take 1 tablet (1,000 Units total) by mouth daily. 10/23/23   Jerrye Katheryn BROCKS, MD  Continuous Glucose Receiver (FREESTYLE LIBRE 2 READER) DEVI Use as directed. 03/28/23   Trixie File, MD  Continuous Glucose Sensor (FREESTYLE LIBRE 2 SENSOR) MISC Change every 14 (fourteen) days. 03/28/23   Trixie File, MD  Elastic Bandages & Supports (T.E.D. KNEE LENGTH/S-LONG) MISC 1 (one) each daily, apply compression stockings daily to help decrease leg swelling 11/11/21     empagliflozin  (JARDIANCE ) 10 MG TABS tablet Take 1 tablet (10 mg total) by mouth daily for diabetes 07/13/23     empagliflozin  (JARDIANCE ) 10 MG TABS tablet Take 1 tablet (10 mg total) by mouth daily. 07/18/24     ferrous  sulfate 325 (65 FE) MG tablet Take 325 mg by mouth daily with breakfast.    [provider]  furosemide  (LASIX ) 40 MG tablet Take 1 tablet (40 mg total) by mouth 2 (two) times daily. 11/10/24     gabapentin  (NEURONTIN ) 300 MG capsule TAKE 2 CAPSULES BY MOUTH AT BEDTIME 07/01/24     glucose blood (ACCU-CHEK GUIDE) test strip Use to check blood sugar three times daily E11.65 08/12/21   Newlin, Enobong, MD  glucose blood test strip use strip to check blood glucose 4-5 times daily 08/19/22     hydrALAZINE  (APRESOLINE ) 50 MG tablet Take 1 tablet (50 mg total) by mouth 3 (three) times daily. 12/04/24     insulin  glargine (LANTUS  SOLOSTAR) 100 UNIT/ML Solostar Pen Inject 12-14 Units into the skin in the morning. 10/28/24     Insulin  Pen Needle (B-D UF III MINI PEN NEEDLES) 31G X 5 MM MISC Inject 1 Pen into the skin daily. 09/20/22     Insulin  Pen Needle (BD PEN NEEDLE NANO U/F) 32G X 4 MM MISC USE TO INJECT LANTUS  DAILY. MUST USE NEW PEN NEEDLE WITH EACH INJECTION. 04/18/22   Trixie File, MD  latanoprost  (XALATAN ) 0.005 % ophthalmic solution Place 1 drop into the left eye at bedtime. 08/17/23   Patel, Narendra, MD  losartan  (COZAAR ) 25 MG tablet Take 1 tablet (25 mg total) by mouth 2 (two) times daily for hypertension. 04/19/24     losartan  (COZAAR ) 50 MG tablet Take 1 tablet (50 mg total) by mouth daily. 04/26/24   Jerrye Katheryn BROCKS, MD  ofloxacin  (OCUFLOX ) 0.3 % ophthalmic solution Place 1 drop into both eyes 4 (four) times daily. 12/15/22   Patel, Narendra, MD  polyethylene glycol (MIRALAX  / GLYCOLAX ) 17 g packet Take 17 g by mouth daily as needed for mild constipation or moderate constipation.    [provider]  potassium chloride  (MICRO-K ) 10 MEQ CR capsule Take 1 capsule (10 mEq total) by mouth daily. 07/18/23   Jerrye Katheryn BROCKS, MD  potassium chloride  (MICRO-K ) 10 MEQ CR capsule Take 2 capsules (20 mEq total) by mouth daily. 11/25/24     rosuvastatin  (CRESTOR ) 40 MG tablet Take 1 tablet (40 mg  total) by mouth daily. 03/08/24     sodium bicarbonate  650 MG tablet Take 1 tablet (650 mg total) by mouth 2 (two) times daily. 10/26/23   Jerrye Katheryn BROCKS, MD  tamsulosin  (FLOMAX ) 0.4 MG CAPS capsule Take 1 capsule (0.4 mg total) by mouth at bedtime. 02/05/24     amLODipine  (NORVASC ) 10 MG tablet Take 1 tablet (10 mg total) by mouth daily. 03/17/22 04/27/22  Singh, Prashant K, MD    Allergies: Patient has no known allergies.    Review of Systems  Neurological:  Positive for weakness.    Updated Vital Signs BP (!) 145/103 (BP Location: Left Arm)   Pulse 87   Temp 98.1 F (36.7 C) (Oral)   Resp 16   Ht 5' 7 (1.702 m)   Wt 78.9 kg   SpO2 95%   BMI 27.25 kg/m   Physical Exam Vitals and nursing note reviewed.  Constitutional:      General: He is not in acute distress.    Appearance: He is well-developed.  HENT:     Head: Normocephalic and atraumatic.     Mouth/Throat:     Mouth: Mucous membranes are dry.  Eyes:     Pupils: Pupils are equal, round, and reactive to light.     Comments: Conjunctiva is injected bilaterally but no tearing to the eyes.  Cardiovascular:     Rate and Rhythm: Normal rate and regular rhythm.     Heart sounds: No murmur heard.    Comments: Left chest pacemaker/defib Pulmonary:     Effort: Pulmonary effort is normal. No respiratory distress.     Breath sounds: Normal breath sounds. No wheezing or rales.  Abdominal:     General: There is no distension.     Palpations: Abdomen is soft.     Tenderness: There is no abdominal tenderness. There is no guarding or rebound.  Musculoskeletal:        General: No tenderness. Normal range of motion.     Cervical back: Normal range of motion and neck supple.     Right lower leg: No edema.     Left lower leg: No edema.  Skin:    General: Skin is warm and dry.     Findings: No erythema or rash.  Neurological:     Mental Status: He is alert and oriented to person, place, and time. Mental status is at baseline.   Psychiatric:        Behavior: Behavior normal.     (all labs ordered are listed, but only abnormal results are displayed) Labs Reviewed  CBC WITH DIFFERENTIAL/PLATELET - Abnormal; Notable for the following components:      Result Value   MCH 25.0 (*)    MCHC 28.3 (*)    Lymphs Abs 0.4 (*)    All other components within normal limits  COMPREHENSIVE METABOLIC PANEL WITH GFR - Abnormal; Notable for the following components:   CO2 16 (*)    Glucose, Bld 280 (*)    BUN 41 (*)    Creatinine, Ser 2.67 (*)    GFR, Estimated 27 (*)    All other components within normal limits  CBG MONITORING, ED - Abnormal; Notable for the following components:   Glucose-Capillary 318 (*)    All other components within normal limits  RESP PANEL BY RT-PCR (RSV, FLU A&B, COVID)  [id number]  MAGNESIUM   URINALYSIS, ROUTINE W REFLEX MICROSCOPIC  I-STAT CG4 LACTIC ACID, ED  I-STAT CG4 LACTIC ACID, ED  I-STAT CG4 LACTIC ACID, ED    EKG: EKG Interpretation Date/Time:  Sunday December 08 2024 21:44:17 EST Ventricular Rate:  107 PR Interval:  174 QRS Duration:  96 QT Interval:  334 QTC Calculation: 444 R Axis:   -11  Text Interpretation: Sinus tachycardia Consider right atrial enlargement Anterior infarct, old background noise TECHNICALLY DIFFICULT (cited on or before Confirmed by Emil Share [phone]) on 12/09/2024 1:29:10 AM  Radiology: [facility] Chest 2 View Result Date: 12/08/2024 EXAM: 2 VIEW(S) XRAY OF THE CHEST 12/08/2024 08:54:00 PM COMPARISON: 08/20/2023 CLINICAL HISTORY: weakness  FINDINGS: LINES, TUBES AND DEVICES: Nerve stimulator over right chest. Left chest AICD in place. LUNGS AND PLEURA: Low lung volumes. No focal pulmonary opacity. No pleural effusion. No pneumothorax. HEART AND MEDIASTINUM: No acute abnormality of the cardiac and mediastinal silhouettes. BONES AND SOFT TISSUES: Mild spondylosis. IMPRESSION: 1. No acute cardiopulmonary abnormality. 2. Left chest AICD and right chest nerve stimulator.  3. Low lung volumes. 4. Chronic mild thoracic spondylosis. Electronically signed by: Dorethia Molt MD 12/08/2024 09:34 PM EST RP Workstation: [id number]     Procedures   Medications Ordered in the ED  insulin  glargine (LANTUS ) injection 12 Units (has no administration in time range)  lactated ringers  bolus 500 mL (500 mLs Intravenous New Bag/Given 12/09/24 0805)  metoCLOPramide  (REGLAN ) injection 10 mg (10 mg Intravenous Given 12/09/24 0804)  acetaminophen  (TYLENOL ) tablet 1,000 mg (1,000 mg Oral Given 12/09/24 0803)                                    Medical Decision Making Amount and/or Complexity of Data Reviewed Independent Historian: EMS External Data Reviewed: notes. Labs: ordered. Decision-making details documented in ED Course. Radiology: ordered and independent interpretation performed. Decision-making details documented in ED Course. ECG/medicine tests: ordered and independent interpretation performed. Decision-making details documented in ED Course.  Risk OTC drugs. Prescription drug management.   Pt with multiple medical problems and comorbidities and presenting today with a complaint that caries a high risk for morbidity and mortality.  Here today with the above complaints.  Concern for possible viral illness such as COVID, flu or other, dehydration, AKI, electrolyte abnormality, pneumonia.  Patient has no focal abdominal tenderness at this time concerning for appendicitis, diverticulitis or pyelonephritis.  Does not appear to be having cardiac exacerbation at this time and low suspicion for PE as patient is chronically anticoagulated and symptoms are not suggestive. I independently interpreted patient's labs and EKG.  Viral swab is negative, CBC without acute findings, CMP with a blood sugar of 280, creatinine of 2.67 which seems similar to the numbers within the last year, normal magnesium  and electrolytes.  EKG without acute findings.  I have independently visualized and  interpreted pt's images today. Chest x-ray without evidence of pneumonia today.  Radiology reports no acute abnormalities but the left chest AICD present in right chest nerve stimulator. Will check lactic acid give patient symptomatic control with fluids and antiemetics.  Initially on arrival patient had a temperature of 100 unfortunately he is waited for 11-1/2 hours and repeat vital signs show improvement in his temperature.  Vital signs have remained stable and are currently normal. Patient is now awake and alert.  He has eaten and appears to feel much better.  At this time feel that he is stable for discharge home.      Final diagnoses:  Dehydration  Acute viral syndrome    ED Discharge Orders     None          Doretha Folks, MD 12/09/24 1213  "

## 2024-12-11 ENCOUNTER — Encounter: Payer: Self-pay | Admitting: Nurse Practitioner

## 2024-12-11 ENCOUNTER — Ambulatory Visit (HOSPITAL_COMMUNITY)
Admission: RE | Admit: 2024-12-11 | Discharge: 2024-12-11 | Disposition: A | Discharge status: Home / Self Care | Source: Ambulatory Visit | Attending: Nurse Practitioner | Admitting: Nurse Practitioner

## 2024-12-11 ENCOUNTER — Other Ambulatory Visit (HOSPITAL_BASED_OUTPATIENT_CLINIC_OR_DEPARTMENT_OTHER): Payer: Self-pay | Admitting: Nurse Practitioner

## 2024-12-11 DIAGNOSIS — G8194 Hemiplegia, unspecified affecting left nondominant side: Secondary | ICD-10-CM | POA: Insufficient documentation

## 2024-12-16 ENCOUNTER — Other Ambulatory Visit: Payer: Self-pay

## 2025-01-17 ENCOUNTER — Encounter

## 2025-04-18 ENCOUNTER — Encounter

## 2025-07-18 ENCOUNTER — Encounter
